# Patient Record
Sex: Female | Born: 1940
Health system: Southern US, Community
[De-identification: ages and names within clinical notes are randomized; demographics above are authoritative.]

## PROBLEM LIST (undated history)

## (undated) ENCOUNTER — Encounter: Attending: Internal Medicine | Primary: Internal Medicine

## (undated) ENCOUNTER — Encounter

## (undated) ENCOUNTER — Ambulatory Visit: Payer: BLUE CROSS/BLUE SHIELD | Attending: Internal Medicine | Primary: Internal Medicine

## (undated) ENCOUNTER — Ambulatory Visit

## (undated) ENCOUNTER — Telehealth

## (undated) ENCOUNTER — Ambulatory Visit: Attending: Family | Primary: Family

## (undated) ENCOUNTER — Ambulatory Visit: Payer: MEDICARE | Attending: Internal Medicine | Primary: Internal Medicine

## (undated) ENCOUNTER — Encounter
Attending: Student in an Organized Health Care Education/Training Program | Primary: Student in an Organized Health Care Education/Training Program

## (undated) ENCOUNTER — Telehealth: Attending: Internal Medicine | Primary: Internal Medicine

## (undated) ENCOUNTER — Ambulatory Visit: Payer: Medicare (Managed Care) | Attending: Internal Medicine | Primary: Internal Medicine

## (undated) DIAGNOSIS — Z8719 Personal history of other diseases of the digestive system: Secondary | ICD-10-CM

## (undated) DIAGNOSIS — H9190 Unspecified hearing loss, unspecified ear: Secondary | ICD-10-CM

## (undated) DIAGNOSIS — K219 Gastro-esophageal reflux disease without esophagitis: Secondary | ICD-10-CM

## (undated) DIAGNOSIS — N840 Polyp of corpus uteri: Secondary | ICD-10-CM

## (undated) DIAGNOSIS — M1990 Unspecified osteoarthritis, unspecified site: Secondary | ICD-10-CM

## (undated) DIAGNOSIS — E785 Hyperlipidemia, unspecified: Secondary | ICD-10-CM

## (undated) DIAGNOSIS — G709 Myoneural disorder, unspecified: Secondary | ICD-10-CM

## (undated) DIAGNOSIS — Z85828 Personal history of other malignant neoplasm of skin: Secondary | ICD-10-CM

## (undated) DIAGNOSIS — C801 Malignant (primary) neoplasm, unspecified: Secondary | ICD-10-CM

## (undated) DIAGNOSIS — I10 Essential (primary) hypertension: Secondary | ICD-10-CM

## (undated) DIAGNOSIS — E039 Hypothyroidism, unspecified: Secondary | ICD-10-CM

## (undated) DIAGNOSIS — L0390 Cellulitis, unspecified: Secondary | ICD-10-CM

## (undated) DIAGNOSIS — R7303 Prediabetes: Secondary | ICD-10-CM

## (undated) DIAGNOSIS — Z973 Presence of spectacles and contact lenses: Secondary | ICD-10-CM

## (undated) DIAGNOSIS — Z9889 Other specified postprocedural states: Secondary | ICD-10-CM

## (undated) DIAGNOSIS — E559 Vitamin D deficiency, unspecified: Secondary | ICD-10-CM

## (undated) DIAGNOSIS — K529 Noninfective gastroenteritis and colitis, unspecified: Secondary | ICD-10-CM

## (undated) DIAGNOSIS — M8580 Other specified disorders of bone density and structure, unspecified site: Secondary | ICD-10-CM

## (undated) HISTORY — PX: EYE SURGERY: SHX253

## (undated) HISTORY — DX: Noninfective gastroenteritis and colitis, unspecified: K52.9

## (undated) HISTORY — DX: Unspecified osteoarthritis, unspecified site: M19.90

## (undated) HISTORY — PX: DILATION AND CURETTAGE OF UTERUS: SHX78

## (undated) HISTORY — DX: Hyperlipidemia, unspecified: E78.5

## (undated) HISTORY — DX: Essential (primary) hypertension: I10

## (undated) HISTORY — DX: Other specified disorders of bone density and structure, unspecified site: M85.80

## (undated) HISTORY — DX: Vitamin D deficiency, unspecified: E55.9

## (undated) HISTORY — DX: Cellulitis, unspecified: L03.90

## (undated) HISTORY — DX: Gastro-esophageal reflux disease without esophagitis: K21.9

## (undated) HISTORY — PX: TONSILLECTOMY: SUR1361

---

## 1998-03-14 ENCOUNTER — Other Ambulatory Visit: Admission: RE | Admit: 1998-03-14 | Discharge: 1998-03-14 | Payer: Self-pay | Admitting: *Deleted

## 1998-03-14 ENCOUNTER — Ambulatory Visit (HOSPITAL_COMMUNITY): Admission: RE | Admit: 1998-03-14 | Discharge: 1998-03-14 | Payer: Self-pay | Admitting: *Deleted

## 1998-03-17 ENCOUNTER — Ambulatory Visit (HOSPITAL_COMMUNITY): Admission: RE | Admit: 1998-03-17 | Discharge: 1998-03-17 | Payer: Self-pay | Admitting: *Deleted

## 1998-05-02 ENCOUNTER — Ambulatory Visit (HOSPITAL_COMMUNITY): Admission: RE | Admit: 1998-05-02 | Discharge: 1998-05-02 | Payer: Self-pay | Admitting: Pulmonary Disease

## 1998-07-28 ENCOUNTER — Encounter: Payer: Self-pay | Admitting: Pulmonary Disease

## 1998-08-03 ENCOUNTER — Ambulatory Visit (HOSPITAL_COMMUNITY): Admission: RE | Admit: 1998-08-03 | Discharge: 1998-08-03 | Payer: Self-pay | Admitting: Pulmonary Disease

## 1999-04-27 ENCOUNTER — Other Ambulatory Visit: Admission: RE | Admit: 1999-04-27 | Discharge: 1999-04-27 | Payer: Self-pay | Admitting: *Deleted

## 1999-07-05 ENCOUNTER — Ambulatory Visit (HOSPITAL_COMMUNITY): Admission: RE | Admit: 1999-07-05 | Discharge: 1999-07-05 | Payer: Self-pay | Admitting: Gastroenterology

## 1999-11-16 ENCOUNTER — Ambulatory Visit (HOSPITAL_COMMUNITY): Admission: RE | Admit: 1999-11-16 | Discharge: 1999-11-16 | Payer: Self-pay | Admitting: *Deleted

## 2000-04-30 ENCOUNTER — Other Ambulatory Visit: Admission: RE | Admit: 2000-04-30 | Discharge: 2000-04-30 | Payer: Self-pay | Admitting: *Deleted

## 2001-06-17 ENCOUNTER — Encounter: Payer: Self-pay | Admitting: Internal Medicine

## 2001-06-17 ENCOUNTER — Ambulatory Visit (HOSPITAL_COMMUNITY): Admission: RE | Admit: 2001-06-17 | Discharge: 2001-06-17 | Payer: Self-pay | Admitting: Internal Medicine

## 2002-06-02 ENCOUNTER — Other Ambulatory Visit: Admission: RE | Admit: 2002-06-02 | Discharge: 2002-06-02 | Payer: Self-pay | Admitting: Internal Medicine

## 2003-06-04 ENCOUNTER — Encounter: Payer: Self-pay | Admitting: Internal Medicine

## 2003-06-04 ENCOUNTER — Ambulatory Visit (HOSPITAL_COMMUNITY): Admission: RE | Admit: 2003-06-04 | Discharge: 2003-06-04 | Payer: Self-pay | Admitting: Pulmonary Disease

## 2003-11-20 HISTORY — PX: MOHS SURGERY: SUR867

## 2004-06-06 ENCOUNTER — Ambulatory Visit (HOSPITAL_COMMUNITY): Admission: RE | Admit: 2004-06-06 | Discharge: 2004-06-06 | Payer: Self-pay | Admitting: Internal Medicine

## 2004-06-06 ENCOUNTER — Other Ambulatory Visit: Admission: RE | Admit: 2004-06-06 | Discharge: 2004-06-06 | Payer: Self-pay | Admitting: Internal Medicine

## 2004-08-30 ENCOUNTER — Encounter (INDEPENDENT_AMBULATORY_CARE_PROVIDER_SITE_OTHER): Payer: Self-pay | Admitting: Specialist

## 2004-08-30 ENCOUNTER — Ambulatory Visit (HOSPITAL_COMMUNITY): Admission: RE | Admit: 2004-08-30 | Discharge: 2004-08-30 | Payer: Self-pay | Admitting: Gastroenterology

## 2004-11-22 ENCOUNTER — Ambulatory Visit (HOSPITAL_COMMUNITY): Admission: RE | Admit: 2004-11-22 | Discharge: 2004-11-22 | Payer: Self-pay | Admitting: Internal Medicine

## 2005-06-15 ENCOUNTER — Ambulatory Visit (HOSPITAL_COMMUNITY): Admission: RE | Admit: 2005-06-15 | Discharge: 2005-06-15 | Payer: Self-pay | Admitting: Internal Medicine

## 2005-12-04 ENCOUNTER — Ambulatory Visit (HOSPITAL_COMMUNITY): Admission: RE | Admit: 2005-12-04 | Discharge: 2005-12-04 | Payer: Self-pay | Admitting: Internal Medicine

## 2006-05-07 ENCOUNTER — Encounter: Admission: RE | Admit: 2006-05-07 | Discharge: 2006-05-07 | Payer: Self-pay | Admitting: Gastroenterology

## 2006-06-20 ENCOUNTER — Ambulatory Visit (HOSPITAL_COMMUNITY): Admission: RE | Admit: 2006-06-20 | Discharge: 2006-06-20 | Payer: Self-pay | Admitting: Internal Medicine

## 2007-07-17 ENCOUNTER — Other Ambulatory Visit: Admission: RE | Admit: 2007-07-17 | Discharge: 2007-07-17 | Payer: Self-pay | Admitting: Internal Medicine

## 2008-07-20 ENCOUNTER — Ambulatory Visit (HOSPITAL_COMMUNITY): Admission: RE | Admit: 2008-07-20 | Discharge: 2008-07-20 | Payer: Self-pay | Admitting: Internal Medicine

## 2008-09-16 ENCOUNTER — Ambulatory Visit: Payer: Self-pay

## 2009-01-13 ENCOUNTER — Ambulatory Visit (HOSPITAL_COMMUNITY): Admission: RE | Admit: 2009-01-13 | Discharge: 2009-01-13 | Payer: Self-pay | Admitting: Internal Medicine

## 2009-08-17 ENCOUNTER — Other Ambulatory Visit: Admission: RE | Admit: 2009-08-17 | Discharge: 2009-08-17 | Payer: Self-pay | Admitting: Internal Medicine

## 2009-08-17 ENCOUNTER — Ambulatory Visit (HOSPITAL_COMMUNITY): Admission: RE | Admit: 2009-08-17 | Discharge: 2009-08-17 | Payer: Self-pay | Admitting: Internal Medicine

## 2011-03-16 ENCOUNTER — Other Ambulatory Visit (HOSPITAL_COMMUNITY): Payer: Self-pay | Admitting: Internal Medicine

## 2011-03-16 ENCOUNTER — Ambulatory Visit (HOSPITAL_COMMUNITY)
Admission: RE | Admit: 2011-03-16 | Discharge: 2011-03-16 | Disposition: A | Discharge status: Home / Self Care | Payer: Medicare Other | Source: Ambulatory Visit | Attending: Internal Medicine | Admitting: Internal Medicine

## 2011-03-16 DIAGNOSIS — Z0000 Encounter for general adult medical examination without abnormal findings: Secondary | ICD-10-CM | POA: Insufficient documentation

## 2011-03-16 DIAGNOSIS — F17200 Nicotine dependence, unspecified, uncomplicated: Secondary | ICD-10-CM | POA: Insufficient documentation

## 2011-03-16 DIAGNOSIS — R5383 Other fatigue: Secondary | ICD-10-CM | POA: Insufficient documentation

## 2011-03-16 DIAGNOSIS — R5381 Other malaise: Secondary | ICD-10-CM | POA: Insufficient documentation

## 2011-04-06 NOTE — Op Note (Signed)
NAMEMAECYN, PANNING                ACCOUNT NO.:  [id number]   MEDICAL RECORD NO.:  [id number]          PATIENT TYPE:  AMB   LOCATION:  ENDO                         FACILITY:  [facility]   PHYSICIAN:  Jeryl Columbia, M.D.    DATE OF BIRTH:  [birth year]   DATE OF PROCEDURE:  08/30/2004  DATE OF DISCHARGE:                                 OPERATIVE REPORT   PROCEDURE:  Colonoscopy.   INDICATIONS:  Patient with a history of colon polyps, due for re-screening,  also with some episodic diarrhea.  Family history of ulcerative colitis.  Consent was signed after risks, benefits, methods, and options thoroughly  discussed multiple times in the past.   MEDICINES USED:  Demerol 70 mg, Versed 7 mg.   PROCEDURE:  Rectal inspection was pertinent for external hemorrhoids, small.  Digital exam was negative.  The video pediatric adjustable colonoscope was  inserted and easily adjusted around the colon to the cecum.  This did  require some abdominal pressure but no position changes.  No abnormality was  seen on insertion.  The cecum was identified by the appendiceal orifice and  the ileocecal valve; in fact, the scope was inserted a short way into the  terminal ileum, which was normal.  Photo documentation was obtained.  Random  biopsies in the TI and the colon were obtained, put in separate containers.  On slow withdrawal through the colon, no abnormalities were seen,  specifically no polyps or masses or diverticula.  The prep  was very good.  Suctioning was not needed.  There was no washing needed.  Once back in the  rectum, anorectal pull-through and retroflexion confirmed some small  hemorrhoids.  The scope was reinserted a short way up the left side of the  colon, air was suctioned, and the scope removed.  The patient tolerated the  procedure well.  There was no obvious immediate complication.   ENDOSCOPIC DIAGNOSES:  1.  Internal-external hemorrhoids.  2.  Otherwise normal colonoscopy to the terminal  ileum, status post random      biopsies throughout.   PLAN:  1.  Await pathology to rule out microscopic colitis.  2.  Happy to see back p.r.n.  Otherwise re-screening in five years.  Return      care to Dr. Johnnye Sima for the customary health care maintenance to      include yearly rectals and guaiacs.       MEM/MEDQ  D:  08/30/2004  T:  08/30/2004  Job:  [id number]   cc:   Darcus Austin, D.O.  [address], [address]  [place]  [place] [id number]  Fax: [phone]

## 2011-08-22 ENCOUNTER — Other Ambulatory Visit: Payer: Self-pay | Admitting: Emergency Medicine

## 2011-08-22 ENCOUNTER — Other Ambulatory Visit (HOSPITAL_COMMUNITY)
Admission: RE | Admit: 2011-08-22 | Discharge: 2011-08-22 | Disposition: A | Discharge status: Home / Self Care | Payer: Medicare Other | Source: Ambulatory Visit | Attending: Internal Medicine | Admitting: Internal Medicine

## 2011-08-22 DIAGNOSIS — Z124 Encounter for screening for malignant neoplasm of cervix: Secondary | ICD-10-CM | POA: Insufficient documentation

## 2011-08-23 ENCOUNTER — Other Ambulatory Visit (HOSPITAL_COMMUNITY): Payer: Self-pay | Admitting: Internal Medicine

## 2011-08-23 DIAGNOSIS — R140 Abdominal distension (gaseous): Secondary | ICD-10-CM

## 2011-08-29 ENCOUNTER — Ambulatory Visit (HOSPITAL_COMMUNITY): Payer: Medicare Other

## 2011-08-31 ENCOUNTER — Ambulatory Visit (HOSPITAL_COMMUNITY)
Admission: RE | Admit: 2011-08-31 | Discharge: 2011-08-31 | Disposition: A | Discharge status: Home / Self Care | Payer: Medicare Other | Source: Ambulatory Visit | Attending: Internal Medicine | Admitting: Internal Medicine

## 2011-08-31 DIAGNOSIS — R140 Abdominal distension (gaseous): Secondary | ICD-10-CM

## 2011-08-31 DIAGNOSIS — N9489 Other specified conditions associated with female genital organs and menstrual cycle: Secondary | ICD-10-CM | POA: Insufficient documentation

## 2011-08-31 DIAGNOSIS — Z780 Asymptomatic menopausal state: Secondary | ICD-10-CM | POA: Insufficient documentation

## 2011-08-31 DIAGNOSIS — D251 Intramural leiomyoma of uterus: Secondary | ICD-10-CM | POA: Insufficient documentation

## 2011-12-04 DIAGNOSIS — K5289 Other specified noninfective gastroenteritis and colitis: Secondary | ICD-10-CM | POA: Diagnosis not present

## 2011-12-04 DIAGNOSIS — R109 Unspecified abdominal pain: Secondary | ICD-10-CM | POA: Diagnosis not present

## 2012-01-10 DIAGNOSIS — E782 Mixed hyperlipidemia: Secondary | ICD-10-CM | POA: Diagnosis not present

## 2012-01-10 DIAGNOSIS — E039 Hypothyroidism, unspecified: Secondary | ICD-10-CM | POA: Diagnosis not present

## 2012-01-10 DIAGNOSIS — N3000 Acute cystitis without hematuria: Secondary | ICD-10-CM | POA: Diagnosis not present

## 2012-01-10 DIAGNOSIS — I10 Essential (primary) hypertension: Secondary | ICD-10-CM | POA: Diagnosis not present

## 2012-01-10 DIAGNOSIS — R7309 Other abnormal glucose: Secondary | ICD-10-CM | POA: Diagnosis not present

## 2012-01-15 DIAGNOSIS — J0410 Acute tracheitis without obstruction: Secondary | ICD-10-CM | POA: Diagnosis not present

## 2012-02-05 DIAGNOSIS — H269 Unspecified cataract: Secondary | ICD-10-CM | POA: Diagnosis not present

## 2012-02-27 DIAGNOSIS — R7309 Other abnormal glucose: Secondary | ICD-10-CM | POA: Diagnosis not present

## 2012-02-27 DIAGNOSIS — E039 Hypothyroidism, unspecified: Secondary | ICD-10-CM | POA: Diagnosis not present

## 2012-02-27 DIAGNOSIS — I10 Essential (primary) hypertension: Secondary | ICD-10-CM | POA: Diagnosis not present

## 2012-02-27 DIAGNOSIS — E782 Mixed hyperlipidemia: Secondary | ICD-10-CM | POA: Diagnosis not present

## 2012-02-27 DIAGNOSIS — E559 Vitamin D deficiency, unspecified: Secondary | ICD-10-CM | POA: Diagnosis not present

## 2012-02-27 DIAGNOSIS — R197 Diarrhea, unspecified: Secondary | ICD-10-CM | POA: Diagnosis not present

## 2012-03-17 DIAGNOSIS — E039 Hypothyroidism, unspecified: Secondary | ICD-10-CM | POA: Diagnosis not present

## 2012-03-17 DIAGNOSIS — M79609 Pain in unspecified limb: Secondary | ICD-10-CM | POA: Diagnosis not present

## 2012-04-16 DIAGNOSIS — L821 Other seborrheic keratosis: Secondary | ICD-10-CM | POA: Diagnosis not present

## 2012-04-16 DIAGNOSIS — L570 Actinic keratosis: Secondary | ICD-10-CM | POA: Diagnosis not present

## 2012-04-16 DIAGNOSIS — Z85828 Personal history of other malignant neoplasm of skin: Secondary | ICD-10-CM | POA: Diagnosis not present

## 2012-06-02 DIAGNOSIS — K5289 Other specified noninfective gastroenteritis and colitis: Secondary | ICD-10-CM | POA: Diagnosis not present

## 2012-06-23 DIAGNOSIS — R7309 Other abnormal glucose: Secondary | ICD-10-CM | POA: Diagnosis not present

## 2012-06-23 DIAGNOSIS — I10 Essential (primary) hypertension: Secondary | ICD-10-CM | POA: Diagnosis not present

## 2012-06-23 DIAGNOSIS — Z79899 Other long term (current) drug therapy: Secondary | ICD-10-CM | POA: Diagnosis not present

## 2012-06-23 DIAGNOSIS — E559 Vitamin D deficiency, unspecified: Secondary | ICD-10-CM | POA: Diagnosis not present

## 2012-06-23 DIAGNOSIS — E782 Mixed hyperlipidemia: Secondary | ICD-10-CM | POA: Diagnosis not present

## 2012-07-11 DIAGNOSIS — N393 Stress incontinence (female) (male): Secondary | ICD-10-CM | POA: Diagnosis not present

## 2012-07-11 DIAGNOSIS — N952 Postmenopausal atrophic vaginitis: Secondary | ICD-10-CM | POA: Diagnosis not present

## 2012-07-11 DIAGNOSIS — Z124 Encounter for screening for malignant neoplasm of cervix: Secondary | ICD-10-CM | POA: Diagnosis not present

## 2012-07-16 DIAGNOSIS — S83429A Sprain of lateral collateral ligament of unspecified knee, initial encounter: Secondary | ICD-10-CM | POA: Diagnosis not present

## 2012-07-16 DIAGNOSIS — I10 Essential (primary) hypertension: Secondary | ICD-10-CM | POA: Diagnosis not present

## 2012-07-16 DIAGNOSIS — E039 Hypothyroidism, unspecified: Secondary | ICD-10-CM | POA: Diagnosis not present

## 2012-08-06 DIAGNOSIS — R3915 Urgency of urination: Secondary | ICD-10-CM | POA: Diagnosis not present

## 2012-08-06 DIAGNOSIS — N393 Stress incontinence (female) (male): Secondary | ICD-10-CM | POA: Diagnosis not present

## 2012-08-19 DIAGNOSIS — Z23 Encounter for immunization: Secondary | ICD-10-CM | POA: Diagnosis not present

## 2012-09-16 DIAGNOSIS — R030 Elevated blood-pressure reading, without diagnosis of hypertension: Secondary | ICD-10-CM | POA: Diagnosis not present

## 2012-09-16 DIAGNOSIS — E559 Vitamin D deficiency, unspecified: Secondary | ICD-10-CM | POA: Diagnosis not present

## 2012-09-16 DIAGNOSIS — R5383 Other fatigue: Secondary | ICD-10-CM | POA: Diagnosis not present

## 2012-09-16 DIAGNOSIS — R350 Frequency of micturition: Secondary | ICD-10-CM | POA: Diagnosis not present

## 2012-09-16 DIAGNOSIS — E782 Mixed hyperlipidemia: Secondary | ICD-10-CM | POA: Diagnosis not present

## 2012-09-16 DIAGNOSIS — R197 Diarrhea, unspecified: Secondary | ICD-10-CM | POA: Diagnosis not present

## 2012-09-16 DIAGNOSIS — Z1212 Encounter for screening for malignant neoplasm of rectum: Secondary | ICD-10-CM | POA: Diagnosis not present

## 2012-09-16 DIAGNOSIS — R5381 Other malaise: Secondary | ICD-10-CM | POA: Diagnosis not present

## 2012-09-16 DIAGNOSIS — E538 Deficiency of other specified B group vitamins: Secondary | ICD-10-CM | POA: Diagnosis not present

## 2012-09-16 DIAGNOSIS — I10 Essential (primary) hypertension: Secondary | ICD-10-CM | POA: Diagnosis not present

## 2012-09-16 DIAGNOSIS — D649 Anemia, unspecified: Secondary | ICD-10-CM | POA: Diagnosis not present

## 2012-09-16 DIAGNOSIS — R7309 Other abnormal glucose: Secondary | ICD-10-CM | POA: Diagnosis not present

## 2012-09-25 DIAGNOSIS — N318 Other neuromuscular dysfunction of bladder: Secondary | ICD-10-CM | POA: Diagnosis not present

## 2012-09-26 DIAGNOSIS — I8310 Varicose veins of unspecified lower extremity with inflammation: Secondary | ICD-10-CM | POA: Diagnosis not present

## 2012-09-30 LAB — FECAL OCCULT BLOOD, GUAIAC: Fecal Occult Blood: NEGATIVE

## 2012-10-01 DIAGNOSIS — Z8262 Family history of osteoporosis: Secondary | ICD-10-CM | POA: Diagnosis not present

## 2012-10-01 DIAGNOSIS — Z1231 Encounter for screening mammogram for malignant neoplasm of breast: Secondary | ICD-10-CM | POA: Diagnosis not present

## 2012-10-01 DIAGNOSIS — M899 Disorder of bone, unspecified: Secondary | ICD-10-CM | POA: Diagnosis not present

## 2012-10-01 DIAGNOSIS — M949 Disorder of cartilage, unspecified: Secondary | ICD-10-CM | POA: Diagnosis not present

## 2012-10-01 LAB — HM DEXA SCAN

## 2012-10-14 DIAGNOSIS — R51 Headache: Secondary | ICD-10-CM | POA: Diagnosis not present

## 2012-10-14 DIAGNOSIS — R7309 Other abnormal glucose: Secondary | ICD-10-CM | POA: Diagnosis not present

## 2012-10-14 DIAGNOSIS — Z79899 Other long term (current) drug therapy: Secondary | ICD-10-CM | POA: Diagnosis not present

## 2012-10-14 DIAGNOSIS — E039 Hypothyroidism, unspecified: Secondary | ICD-10-CM | POA: Diagnosis not present

## 2012-10-14 DIAGNOSIS — M542 Cervicalgia: Secondary | ICD-10-CM | POA: Diagnosis not present

## 2012-10-21 DIAGNOSIS — T7840XA Allergy, unspecified, initial encounter: Secondary | ICD-10-CM | POA: Diagnosis not present

## 2012-10-23 DIAGNOSIS — M79609 Pain in unspecified limb: Secondary | ICD-10-CM | POA: Diagnosis not present

## 2012-10-27 DIAGNOSIS — I8310 Varicose veins of unspecified lower extremity with inflammation: Secondary | ICD-10-CM | POA: Diagnosis not present

## 2012-10-29 DIAGNOSIS — I10 Essential (primary) hypertension: Secondary | ICD-10-CM | POA: Diagnosis not present

## 2012-10-29 DIAGNOSIS — E039 Hypothyroidism, unspecified: Secondary | ICD-10-CM | POA: Diagnosis not present

## 2012-10-29 DIAGNOSIS — E782 Mixed hyperlipidemia: Secondary | ICD-10-CM | POA: Diagnosis not present

## 2012-11-10 DIAGNOSIS — J019 Acute sinusitis, unspecified: Secondary | ICD-10-CM | POA: Diagnosis not present

## 2012-12-01 DIAGNOSIS — K5289 Other specified noninfective gastroenteritis and colitis: Secondary | ICD-10-CM | POA: Diagnosis not present

## 2012-12-02 DIAGNOSIS — Z79899 Other long term (current) drug therapy: Secondary | ICD-10-CM | POA: Diagnosis not present

## 2012-12-02 DIAGNOSIS — E039 Hypothyroidism, unspecified: Secondary | ICD-10-CM | POA: Diagnosis not present

## 2012-12-29 DIAGNOSIS — I8310 Varicose veins of unspecified lower extremity with inflammation: Secondary | ICD-10-CM | POA: Diagnosis not present

## 2013-01-12 DIAGNOSIS — I8310 Varicose veins of unspecified lower extremity with inflammation: Secondary | ICD-10-CM | POA: Diagnosis not present

## 2013-01-12 DIAGNOSIS — M79609 Pain in unspecified limb: Secondary | ICD-10-CM | POA: Diagnosis not present

## 2013-01-14 DIAGNOSIS — I8310 Varicose veins of unspecified lower extremity with inflammation: Secondary | ICD-10-CM | POA: Diagnosis not present

## 2013-01-14 DIAGNOSIS — M79609 Pain in unspecified limb: Secondary | ICD-10-CM | POA: Diagnosis not present

## 2013-01-28 DIAGNOSIS — I10 Essential (primary) hypertension: Secondary | ICD-10-CM | POA: Diagnosis not present

## 2013-01-28 DIAGNOSIS — R7309 Other abnormal glucose: Secondary | ICD-10-CM | POA: Diagnosis not present

## 2013-01-28 DIAGNOSIS — Z79899 Other long term (current) drug therapy: Secondary | ICD-10-CM | POA: Diagnosis not present

## 2013-01-28 DIAGNOSIS — E782 Mixed hyperlipidemia: Secondary | ICD-10-CM | POA: Diagnosis not present

## 2013-01-28 DIAGNOSIS — E559 Vitamin D deficiency, unspecified: Secondary | ICD-10-CM | POA: Diagnosis not present

## 2013-01-29 DIAGNOSIS — M79609 Pain in unspecified limb: Secondary | ICD-10-CM | POA: Diagnosis not present

## 2013-01-29 DIAGNOSIS — I8310 Varicose veins of unspecified lower extremity with inflammation: Secondary | ICD-10-CM | POA: Diagnosis not present

## 2013-02-02 DIAGNOSIS — H52229 Regular astigmatism, unspecified eye: Secondary | ICD-10-CM | POA: Diagnosis not present

## 2013-02-02 DIAGNOSIS — H2510 Age-related nuclear cataract, unspecified eye: Secondary | ICD-10-CM | POA: Diagnosis not present

## 2013-02-02 DIAGNOSIS — H524 Presbyopia: Secondary | ICD-10-CM | POA: Diagnosis not present

## 2013-02-02 DIAGNOSIS — H5231 Anisometropia: Secondary | ICD-10-CM | POA: Diagnosis not present

## 2013-02-12 DIAGNOSIS — I8310 Varicose veins of unspecified lower extremity with inflammation: Secondary | ICD-10-CM | POA: Diagnosis not present

## 2013-02-12 DIAGNOSIS — M79609 Pain in unspecified limb: Secondary | ICD-10-CM | POA: Diagnosis not present

## 2013-02-12 DIAGNOSIS — M7981 Nontraumatic hematoma of soft tissue: Secondary | ICD-10-CM | POA: Diagnosis not present

## 2013-03-18 DIAGNOSIS — M7981 Nontraumatic hematoma of soft tissue: Secondary | ICD-10-CM | POA: Diagnosis not present

## 2013-04-16 DIAGNOSIS — Z85828 Personal history of other malignant neoplasm of skin: Secondary | ICD-10-CM | POA: Diagnosis not present

## 2013-04-16 DIAGNOSIS — L821 Other seborrheic keratosis: Secondary | ICD-10-CM | POA: Diagnosis not present

## 2013-04-16 DIAGNOSIS — L570 Actinic keratosis: Secondary | ICD-10-CM | POA: Diagnosis not present

## 2013-04-16 DIAGNOSIS — L608 Other nail disorders: Secondary | ICD-10-CM | POA: Diagnosis not present

## 2013-04-30 DIAGNOSIS — E782 Mixed hyperlipidemia: Secondary | ICD-10-CM | POA: Diagnosis not present

## 2013-04-30 DIAGNOSIS — E559 Vitamin D deficiency, unspecified: Secondary | ICD-10-CM | POA: Diagnosis not present

## 2013-04-30 DIAGNOSIS — Z79899 Other long term (current) drug therapy: Secondary | ICD-10-CM | POA: Diagnosis not present

## 2013-04-30 DIAGNOSIS — E039 Hypothyroidism, unspecified: Secondary | ICD-10-CM | POA: Diagnosis not present

## 2013-04-30 DIAGNOSIS — R7309 Other abnormal glucose: Secondary | ICD-10-CM | POA: Diagnosis not present

## 2013-04-30 DIAGNOSIS — I10 Essential (primary) hypertension: Secondary | ICD-10-CM | POA: Diagnosis not present

## 2013-05-14 DIAGNOSIS — N3000 Acute cystitis without hematuria: Secondary | ICD-10-CM | POA: Diagnosis not present

## 2013-06-09 DIAGNOSIS — K2100 Gastro-esophageal reflux disease with esophagitis, without bleeding: Secondary | ICD-10-CM | POA: Diagnosis not present

## 2013-06-09 DIAGNOSIS — K5289 Other specified noninfective gastroenteritis and colitis: Secondary | ICD-10-CM | POA: Diagnosis not present

## 2013-06-10 DIAGNOSIS — J449 Chronic obstructive pulmonary disease, unspecified: Secondary | ICD-10-CM | POA: Diagnosis not present

## 2013-06-10 DIAGNOSIS — N3000 Acute cystitis without hematuria: Secondary | ICD-10-CM | POA: Diagnosis not present

## 2013-06-10 DIAGNOSIS — J4489 Other specified chronic obstructive pulmonary disease: Secondary | ICD-10-CM | POA: Diagnosis not present

## 2013-06-30 DIAGNOSIS — E559 Vitamin D deficiency, unspecified: Secondary | ICD-10-CM | POA: Diagnosis not present

## 2013-06-30 DIAGNOSIS — I10 Essential (primary) hypertension: Secondary | ICD-10-CM | POA: Diagnosis not present

## 2013-06-30 DIAGNOSIS — E782 Mixed hyperlipidemia: Secondary | ICD-10-CM | POA: Diagnosis not present

## 2013-06-30 DIAGNOSIS — Z79899 Other long term (current) drug therapy: Secondary | ICD-10-CM | POA: Diagnosis not present

## 2013-06-30 DIAGNOSIS — E538 Deficiency of other specified B group vitamins: Secondary | ICD-10-CM | POA: Diagnosis not present

## 2013-06-30 DIAGNOSIS — R7309 Other abnormal glucose: Secondary | ICD-10-CM | POA: Diagnosis not present

## 2013-07-09 DIAGNOSIS — I8310 Varicose veins of unspecified lower extremity with inflammation: Secondary | ICD-10-CM | POA: Diagnosis not present

## 2013-07-09 DIAGNOSIS — M79609 Pain in unspecified limb: Secondary | ICD-10-CM | POA: Diagnosis not present

## 2013-07-14 DIAGNOSIS — I8310 Varicose veins of unspecified lower extremity with inflammation: Secondary | ICD-10-CM | POA: Diagnosis not present

## 2013-07-14 DIAGNOSIS — M79609 Pain in unspecified limb: Secondary | ICD-10-CM | POA: Diagnosis not present

## 2013-08-18 DIAGNOSIS — Z23 Encounter for immunization: Secondary | ICD-10-CM | POA: Diagnosis not present

## 2013-09-01 ENCOUNTER — Encounter: Payer: Self-pay | Admitting: Internal Medicine

## 2013-09-02 DIAGNOSIS — H04129 Dry eye syndrome of unspecified lacrimal gland: Secondary | ICD-10-CM | POA: Diagnosis not present

## 2013-09-04 ENCOUNTER — Encounter: Payer: Self-pay | Admitting: Internal Medicine

## 2013-11-03 DIAGNOSIS — Z1231 Encounter for screening mammogram for malignant neoplasm of breast: Secondary | ICD-10-CM | POA: Diagnosis not present

## 2013-11-03 DIAGNOSIS — Z803 Family history of malignant neoplasm of breast: Secondary | ICD-10-CM | POA: Diagnosis not present

## 2013-12-10 DIAGNOSIS — K5289 Other specified noninfective gastroenteritis and colitis: Secondary | ICD-10-CM | POA: Diagnosis not present

## 2013-12-15 ENCOUNTER — Encounter: Payer: Self-pay | Admitting: Emergency Medicine

## 2013-12-15 ENCOUNTER — Ambulatory Visit (INDEPENDENT_AMBULATORY_CARE_PROVIDER_SITE_OTHER): Payer: Medicare Other | Admitting: Emergency Medicine

## 2013-12-15 VITALS — BP 118/76 | HR 78 | Temp 98.6°F | Resp 18 | Ht 65.5 in | Wt 112.0 lb

## 2013-12-15 DIAGNOSIS — E039 Hypothyroidism, unspecified: Secondary | ICD-10-CM | POA: Insufficient documentation

## 2013-12-15 DIAGNOSIS — K219 Gastro-esophageal reflux disease without esophagitis: Secondary | ICD-10-CM

## 2013-12-15 DIAGNOSIS — R5383 Other fatigue: Secondary | ICD-10-CM

## 2013-12-15 DIAGNOSIS — D649 Anemia, unspecified: Secondary | ICD-10-CM

## 2013-12-15 DIAGNOSIS — R7309 Other abnormal glucose: Secondary | ICD-10-CM

## 2013-12-15 DIAGNOSIS — E559 Vitamin D deficiency, unspecified: Secondary | ICD-10-CM

## 2013-12-15 DIAGNOSIS — T7840XA Allergy, unspecified, initial encounter: Secondary | ICD-10-CM | POA: Insufficient documentation

## 2013-12-15 DIAGNOSIS — M129 Arthropathy, unspecified: Secondary | ICD-10-CM | POA: Diagnosis not present

## 2013-12-15 DIAGNOSIS — R200 Anesthesia of skin: Secondary | ICD-10-CM

## 2013-12-15 DIAGNOSIS — R030 Elevated blood-pressure reading, without diagnosis of hypertension: Secondary | ICD-10-CM

## 2013-12-15 DIAGNOSIS — E782 Mixed hyperlipidemia: Secondary | ICD-10-CM | POA: Insufficient documentation

## 2013-12-15 DIAGNOSIS — E785 Hyperlipidemia, unspecified: Secondary | ICD-10-CM | POA: Diagnosis not present

## 2013-12-15 DIAGNOSIS — I10 Essential (primary) hypertension: Secondary | ICD-10-CM

## 2013-12-15 DIAGNOSIS — M810 Age-related osteoporosis without current pathological fracture: Secondary | ICD-10-CM | POA: Insufficient documentation

## 2013-12-15 DIAGNOSIS — R5381 Other malaise: Secondary | ICD-10-CM

## 2013-12-15 DIAGNOSIS — Z0000 Encounter for general adult medical examination without abnormal findings: Secondary | ICD-10-CM

## 2013-12-15 DIAGNOSIS — R350 Frequency of micturition: Secondary | ICD-10-CM | POA: Diagnosis not present

## 2013-12-15 DIAGNOSIS — K2100 Gastro-esophageal reflux disease with esophagitis, without bleeding: Secondary | ICD-10-CM | POA: Insufficient documentation

## 2013-12-15 DIAGNOSIS — M8000XA Age-related osteoporosis with current pathological fracture, unspecified site, initial encounter for fracture: Secondary | ICD-10-CM | POA: Insufficient documentation

## 2013-12-15 DIAGNOSIS — E538 Deficiency of other specified B group vitamins: Secondary | ICD-10-CM

## 2013-12-15 DIAGNOSIS — M1990 Unspecified osteoarthritis, unspecified site: Secondary | ICD-10-CM | POA: Insufficient documentation

## 2013-12-15 DIAGNOSIS — J45909 Unspecified asthma, uncomplicated: Secondary | ICD-10-CM | POA: Insufficient documentation

## 2013-12-15 DIAGNOSIS — M8580 Other specified disorders of bone density and structure, unspecified site: Secondary | ICD-10-CM | POA: Insufficient documentation

## 2013-12-15 LAB — MAGNESIUM: MAGNESIUM: 1.9 mg/dL (ref 1.5–2.5)

## 2013-12-15 LAB — TSH: TSH: 1.218 u[IU]/mL (ref 0.350–4.500)

## 2013-12-15 LAB — BASIC METABOLIC PANEL WITH GFR
BUN: 14 mg/dL (ref 6–23)
CALCIUM: 9.2 mg/dL (ref 8.4–10.5)
CHLORIDE: 106 meq/L (ref 96–112)
CO2: 26 meq/L (ref 19–32)
Creat: 0.84 mg/dL (ref 0.50–1.10)
GFR, EST AFRICAN AMERICAN: 80 mL/min
GFR, EST NON AFRICAN AMERICAN: 70 mL/min
GLUCOSE: 80 mg/dL (ref 70–99)
POTASSIUM: 4.2 meq/L (ref 3.5–5.3)
Sodium: 139 mEq/L (ref 135–145)

## 2013-12-15 LAB — CBC WITH DIFFERENTIAL/PLATELET
Basophils Absolute: 0 10*3/uL (ref 0.0–0.1)
Basophils Relative: 1 % (ref 0–1)
EOS PCT: 1 % (ref 0–5)
Eosinophils Absolute: 0.1 10*3/uL (ref 0.0–0.7)
HCT: 42.3 % (ref 36.0–46.0)
Hemoglobin: 14.6 g/dL (ref 12.0–15.0)
LYMPHS PCT: 29 % (ref 12–46)
Lymphs Abs: 2 10*3/uL (ref 0.7–4.0)
MCH: 32.4 pg (ref 26.0–34.0)
MCHC: 34.5 g/dL (ref 30.0–36.0)
MCV: 93.8 fL (ref 78.0–100.0)
MONO ABS: 0.6 10*3/uL (ref 0.1–1.0)
MONOS PCT: 9 % (ref 3–12)
Neutro Abs: 4.1 10*3/uL (ref 1.7–7.7)
Neutrophils Relative %: 60 % (ref 43–77)
Platelets: 169 10*3/uL (ref 150–400)
RBC: 4.51 MIL/uL (ref 3.87–5.11)
RDW: 14.7 % (ref 11.5–15.5)
WBC: 6.8 10*3/uL (ref 4.0–10.5)

## 2013-12-15 LAB — HEMOGLOBIN A1C
HEMOGLOBIN A1C: 5.8 % — AB (ref <5.7)
MEAN PLASMA GLUCOSE: 120 mg/dL — AB (ref <117)

## 2013-12-15 LAB — HEPATIC FUNCTION PANEL
ALBUMIN: 4.7 g/dL (ref 3.5–5.2)
ALT: 16 U/L (ref 0–35)
AST: 20 U/L (ref 0–37)
Alkaline Phosphatase: 38 U/L — ABNORMAL LOW (ref 39–117)
BILIRUBIN INDIRECT: 0.3 mg/dL (ref 0.0–0.9)
BILIRUBIN TOTAL: 0.4 mg/dL (ref 0.3–1.2)
Bilirubin, Direct: 0.1 mg/dL (ref 0.0–0.3)
Total Protein: 6.8 g/dL (ref 6.0–8.3)

## 2013-12-15 LAB — LIPID PANEL
CHOL/HDL RATIO: 2.5 ratio
CHOLESTEROL: 181 mg/dL (ref 0–200)
HDL: 71 mg/dL (ref >39)
LDL CALC: 95 mg/dL (ref 0–99)
TRIGLYCERIDES: 74 mg/dL (ref <150)
VLDL: 15 mg/dL (ref 0–40)

## 2013-12-15 LAB — SEDIMENTATION RATE: Sed Rate: 1 mm/hr (ref 0–22)

## 2013-12-15 LAB — RHEUMATOID FACTOR: RHEUMATOID FACTOR: 13 [IU]/mL (ref <=14)

## 2013-12-15 NOTE — Progress Notes (Signed)
Subjective:    Patient ID: Debbie Houston, female    DOB: 12-Apr-1941, 73 y.o.   MRN: [id number]  HPI Comments: 73 yo female CPE and presents for 3 month F/U for elevated bP without HTN, Cholesterol, Pre-Dm, D. deficient She has mild fatigue but resolves with naps. She has been craving sugar but eats healthy for the most part. She does water aerobic 3 x week, but keeps very active. She has never been on any HTN Rx she has had occasional elevated BP levels. She does not check BP routinely.  She has noted increased urine frequency. She has tried to increase water to help with flushing if is infection. She notes chronic diarrhea and sees [facility]  On routine basis for treatment. She notes diarrhea has improved somewhat. Her last OV was 2-3 weeks ago.   She has had increased arthritis pain in her hands right > left. She also notes increased tingling in her feet and hands. She has also noticed increase with night sweats. She is concerned the Tamoxifen may be causing some of these symptoms. She takes Tamoxifen for Osteoporosis. BMD is due 11/15.   She is up to date on her mammogram, in [facility] at [facility].       Hyperlipidemia   Current Outpatient Prescriptions on File Prior to Visit  Medication Sig Dispense Refill  . Biotin 1 MG CAPS Take 1 mg by mouth daily.      . cetirizine (ZYRTEC) 10 MG tablet Take 10 mg by mouth daily.      . cholecalciferol (VITAMIN D) 1000 UNITS tablet Take 2,000 Units by mouth daily.      . fish oil-omega-3 fatty acids 1000 MG capsule Take 2 g by mouth daily.      . Flaxseed, Linseed, (FLAX SEED OIL) 1000 MG CAPS Take 1,000 mg by mouth daily.      Marland Kitchen levothyroxine (SYNTHROID, LEVOTHROID) 125 MCG tablet Take 125 mcg by mouth daily before breakfast. 1 pill M,W,F and 1/2 pill Tues, Thurs, Sat, Sun      . simvastatin (ZOCOR) 20 MG tablet Take 20 mg by mouth every evening.      . tamoxifen (NOLVADEX) 20 MG tablet Take 20 mg by mouth daily.       No current facility-administered  medications on file prior to visit.   ALLERGIES Fosamax; Ppd; Singulair; Wellbutrin; and Sulfa antibiotics  Past Medical History  Diagnosis Date  . Hyperlipidemia   . Hypothyroid   . GERD (gastroesophageal reflux disease)   . Asthma   . Arthritis   . DJD (degenerative joint disease)   . Osteopenia   . Vitamin D deficiency   . Allergy   . Colitis   . Anemia   . Benign labile hypertension    Past Surgical History  Procedure Laterality Date  . Tonsillectomy    . Mohs surgery      LEFT NASAL BRIDGE FOR BASAL CELL   History  Substance Use Topics  . Smoking status: Light Tobacco Smoker -- 15 years    Types: Cigarettes  . Smokeless tobacco: Not on file     Comment: RARELY SMOKES  . Alcohol Use: Yes     Comment: OCCASIONAL   Family History  Problem Relation Age of Onset  . Hypertension Mother   . Cancer Mother     COLON  . Hyperlipidemia Mother   . Cancer Father     BREAST WITH BRAIN METS  . Hyperlipidemia Father   . Cancer Sister  COLON (TEENS), BREAST (50)  . Hyperlipidemia Sister   . Cancer Maternal Grandmother     RENAL  . Hyperlipidemia Maternal Grandmother   . Diabetes Brother   . Hyperlipidemia Brother   . Diabetes Maternal Grandfather   . Stroke Maternal Grandfather   . Hyperlipidemia Maternal Grandfather   . Hyperlipidemia Paternal Grandmother   . Hyperlipidemia Paternal Grandfather        Review of Systems  Eyes:       MILLER 2014 WNL  Gastrointestinal: Positive for diarrhea.       MAGOD Q 6MONTHS COLONOSCOPY 1/11 POLYP DUE 2016  Genitourinary: Positive for frequency.       GU NESI PRN GYN ROSS 2012 WNL  Musculoskeletal: Positive for arthralgias.  Skin:       JONES 2014 WNL  Neurological: Positive for numbness.  All other systems reviewed and are negative.   BP 118/76  Pulse 78  Temp(Src) 98.6 F (37 C) (Temporal)  Resp 18  Ht 5' 5.5" (1.664 m)  Wt 112 lb (50.803 kg)  BMI 18.35 kg/m2      Objective:   Physical Exam   Nursing note and vitals reviewed. Constitutional: She is oriented to person, place, and time. She appears well-developed and well-nourished. No distress.  HENT:  Head: Normocephalic and atraumatic.  Right Ear: External ear normal.  Left Ear: External ear normal.  Nose: Nose normal.  Mouth/Throat: Oropharynx is clear and moist.  Eyes: Conjunctivae and EOM are normal. Pupils are equal, round, and reactive to light. Right eye exhibits no discharge. Left eye exhibits no discharge. No scleral icterus.  Neck: Normal range of motion. Neck supple. No JVD present. No tracheal deviation present. No thyromegaly present.  Cardiovascular: Normal rate, regular rhythm, normal heart sounds and intact distal pulses.   Pulmonary/Chest: Effort normal and breath sounds normal.  Abdominal: Soft. Bowel sounds are normal. She exhibits no distension and no mass. There is no tenderness. There is no rebound and no guarding.  Genitourinary:  Def to gyn  Musculoskeletal: Normal range of motion. She exhibits no edema and no tenderness.  Lymphadenopathy:    She has no cervical adenopathy.  Neurological: She is alert and oriented to person, place, and time. She has normal reflexes. No cranial nerve deficit. She exhibits normal muscle tone. Coordination normal.  Skin: Skin is warm and dry. No rash noted. No erythema. No pallor.  Psychiatric: She has a normal mood and affect. Her behavior is normal. Judgment and thought content normal.      EKG NSCSPT WNL AORTA SCAN WNL    Assessment & Plan:  1. CPE and 3 month F/U for Hypothyroid, anemia, Elevated BP with out HTN,  Cholesterol, Pre-Dm, D. Deficient. Needs healthy diet, cardio QD and obtain healthy weight. Check Labs, Check BP if >130/80 call office. uPDATE IMMUNIZATIONS/ SCREENING LABS/ History 2. Urine frequency/ Fatigue- check labs, increase activity and H2O. Urine hygiene explained. 3.Paraesthias/ Arthritis- Check labs advise h2o exercise increase to QD. May  consider Neck xray

## 2013-12-15 NOTE — Patient Instructions (Addendum)
Urinary Tract Infection A urinary tract infection (UTI) can occur any place along the urinary tract. The tract includes the kidneys, ureters, bladder, and urethra. A type of germ called bacteria often causes a UTI. UTIs are often helped with antibiotic medicine.  HOME CARE   If given, take antibiotics as told by your doctor. Finish them even if you start to feel better.  Drink enough fluids to keep your pee (urine) clear or pale yellow.  Avoid tea, drinks with caffeine, and bubbly (carbonated) drinks.  Pee often. Avoid holding your pee in for a long time.  Pee before and after having sex (intercourse).  Wipe from front to back after you poop (bowel movement) if you are a woman. Use each tissue only once. GET HELP RIGHT AWAY IF:   You have back pain.  You have lower belly (abdominal) pain.  You have chills.  You feel sick to your stomach (nauseous).  You throw up (vomit).  Your burning or discomfort with peeing does not go away.  You have a fever.  Your symptoms are not better in 3 days. MAKE SURE YOU:   Understand these instructions.  Will watch your condition.  Will get help right away if you are not doing well or get worse. Document Released: 04/23/2008 Document Revised: 07/30/2012 Document Reviewed: 06/05/2012 [facility] Patient Information 2014 [place], [place]. Arthritis, Nonspecific Arthritis is pain, redness, warmth, or puffiness (inflammation) of a joint. The joint may be stiff or hurt when you move it. One or more joints may be affected. There are many types of arthritis. Your doctor may not know what type you have right away. The most common cause of arthritis is wear and tear on the joint (osteoarthritis). HOME CARE  Only take medicine as told by your doctor. Rest the joint as much as possible. Raise (elevate) your joint if it is puffy. Use crutches if the painful joint is in your leg. Drink enough fluids to keep your pee (urine) clear or pale yellow. Follow  your doctor's diet instructions. Use cold packs for very bad joint pain for 10 to 15 minutes every hour. Ask your doctor if it is okay for you to use hot packs. Exercise as told by your doctor. Take a warm shower if you have stiffness in the morning. Move your sore joints throughout the day. GET HELP RIGHT AWAY IF:  You have a fever. You have very bad joint pain, puffiness, or redness. You have many joints that are painful and puffy. You are not getting better with treatment. You have very bad back pain or leg weakness. You cannot control when you poop (bowel movement) or pee (urinate). You do not feel better in 24 hours or are getting worse. You are having side effects from your medicine. MAKE SURE YOU:  Understand these instructions. Will watch your condition. Will get help right away if you are not doing well or get worse. Document Released: 01/30/2010 Document Revised: 05/06/2012 Document Reviewed: 01/30/2010 [facility] Patient Information 2014 [place], [place].

## 2013-12-16 LAB — URINALYSIS, ROUTINE W REFLEX MICROSCOPIC
Bilirubin Urine: NEGATIVE
GLUCOSE, UA: NEGATIVE mg/dL
Hgb urine dipstick: NEGATIVE
Ketones, ur: NEGATIVE mg/dL
LEUKOCYTES UA: NEGATIVE
NITRITE: NEGATIVE
PH: 7 (ref 5.0–8.0)
Protein, ur: NEGATIVE mg/dL
Specific Gravity, Urine: 1.008 (ref 1.005–1.030)
Urobilinogen, UA: 0.2 mg/dL (ref 0.0–1.0)

## 2013-12-16 LAB — URINE CULTURE
COLONY COUNT: NO GROWTH
Organism ID, Bacteria: NO GROWTH

## 2013-12-16 LAB — MICROALBUMIN / CREATININE URINE RATIO
CREATININE, URINE: 46.8 mg/dL
Microalb Creat Ratio: 17.3 mg/g (ref 0.0–30.0)
Microalb, Ur: 0.81 mg/dL (ref 0.00–1.89)

## 2013-12-16 LAB — INSULIN, FASTING: INSULIN FASTING, SERUM: 2 u[IU]/mL — AB (ref 3–28)

## 2013-12-16 LAB — CYCLIC CITRUL PEPTIDE ANTIBODY, IGG: Cyclic Citrullin Peptide Ab: <2 U/mL (ref 0.0–5.0)

## 2013-12-17 NOTE — Progress Notes (Signed)
LMOM TO CALL & SCHEDULE  

## 2013-12-29 ENCOUNTER — Ambulatory Visit
Admission: RE | Admit: 2013-12-29 | Discharge: 2013-12-29 | Disposition: A | Discharge status: Home / Self Care | Payer: Medicare Other | Source: Ambulatory Visit | Attending: Emergency Medicine | Admitting: Emergency Medicine

## 2013-12-29 DIAGNOSIS — R5381 Other malaise: Secondary | ICD-10-CM

## 2013-12-29 DIAGNOSIS — R059 Cough, unspecified: Secondary | ICD-10-CM | POA: Diagnosis not present

## 2013-12-29 DIAGNOSIS — M47812 Spondylosis without myelopathy or radiculopathy, cervical region: Secondary | ICD-10-CM | POA: Diagnosis not present

## 2013-12-29 DIAGNOSIS — J45909 Unspecified asthma, uncomplicated: Secondary | ICD-10-CM

## 2013-12-29 DIAGNOSIS — R5383 Other fatigue: Secondary | ICD-10-CM

## 2013-12-29 DIAGNOSIS — R200 Anesthesia of skin: Secondary | ICD-10-CM

## 2013-12-29 DIAGNOSIS — R05 Cough: Secondary | ICD-10-CM | POA: Diagnosis not present

## 2014-01-16 ENCOUNTER — Other Ambulatory Visit: Payer: Self-pay | Admitting: Internal Medicine

## 2014-01-18 ENCOUNTER — Other Ambulatory Visit: Payer: Self-pay | Admitting: Internal Medicine

## 2014-02-04 DIAGNOSIS — H04129 Dry eye syndrome of unspecified lacrimal gland: Secondary | ICD-10-CM | POA: Diagnosis not present

## 2014-02-04 DIAGNOSIS — H524 Presbyopia: Secondary | ICD-10-CM | POA: Diagnosis not present

## 2014-02-04 DIAGNOSIS — H52229 Regular astigmatism, unspecified eye: Secondary | ICD-10-CM | POA: Diagnosis not present

## 2014-02-04 DIAGNOSIS — H5210 Myopia, unspecified eye: Secondary | ICD-10-CM | POA: Diagnosis not present

## 2014-02-10 ENCOUNTER — Encounter: Payer: Self-pay | Admitting: Physician Assistant

## 2014-02-10 ENCOUNTER — Ambulatory Visit (INDEPENDENT_AMBULATORY_CARE_PROVIDER_SITE_OTHER): Payer: Medicare Other | Admitting: Physician Assistant

## 2014-02-10 VITALS — BP 102/68 | HR 100 | Temp 99.3°F | Resp 16 | Ht 65.5 in | Wt 110.0 lb

## 2014-02-10 DIAGNOSIS — J0100 Acute maxillary sinusitis, unspecified: Secondary | ICD-10-CM | POA: Diagnosis not present

## 2014-02-10 DIAGNOSIS — Z0000 Encounter for general adult medical examination without abnormal findings: Secondary | ICD-10-CM

## 2014-02-10 DIAGNOSIS — Z1331 Encounter for screening for depression: Secondary | ICD-10-CM

## 2014-02-10 MED ORDER — AZITHROMYCIN 250 MG PO TABS: ORAL_TABLET | ORAL | Status: DC

## 2014-02-10 MED ORDER — PREDNISONE 20 MG PO TABS: ORAL_TABLET | ORAL | Status: DC

## 2014-02-10 NOTE — Progress Notes (Signed)
Subjective:   Debbie Houston is a 73 y.o. female who presents for Medicare Annual Wellness Visit and has had cold symptoms since Friday.  Date of last medicare wellness visit is unknown.   Her blood pressure has been controlled at home, today their BP is BP: 102/68 mmHg She does workout. She denies chest pain, shortness of breath, dizziness.  She has had a low grade temperature, sinus pressure, rhinorrhea, cough with yellow production since Friday, not getting better. Denies wheezing, SOB, nasuea. Has been on mucinex and sinus decongestant without help.   Names of Other Physician/Practitioners you currently use: 1. [facility]- here for primary care 2. Dr. Sabra Heck, eye doctor, last visit 01/31/2014 3. , dentist, last visit 08/2013 Patient Care Team: Unk Pinto, MD as PCP - General (Internal Medicine)  Medication Review Current Outpatient Prescriptions on File Prior to Visit  Medication Sig Dispense Refill  . aspirin 81 MG tablet Take 81 mg by mouth daily.      . Biotin 1 MG CAPS Take 1 mg by mouth daily.      . budesonide (ENTOCORT EC) 3 MG 24 hr capsule Take 3 mg by mouth daily.      . Calcium Carbonate (CALCIUM 600 PO) Take by mouth daily.      . cetirizine (ZYRTEC) 10 MG tablet Take 10 mg by mouth daily.      . cholecalciferol (VITAMIN D) 1000 UNITS tablet Take 2,000 Units by mouth daily.      . fish oil-omega-3 fatty acids 1000 MG capsule Take 2 g by mouth daily.      . Flaxseed, Linseed, (FLAX SEED OIL) 1000 MG CAPS Take 1,000 mg by mouth daily.      Marland Kitchen levothyroxine (SYNTHROID, LEVOTHROID) 125 MCG tablet TAKE ONE TABLET BY MOUTH DAILY  90 tablet  1  . Multiple Vitamins-Minerals (MULTIVITAMIN WITH MINERALS) tablet Take 1 tablet by mouth daily.      . Probiotic Product (PROBIOTIC DAILY PO) Take by mouth daily.      . simvastatin (ZOCOR) 20 MG tablet TAKE 1 TABLET BY MOUTH ONCE DAILY  30 tablet  3  . tamoxifen (NOLVADEX) 20 MG tablet TAKE 1  TABLET BY MOUTH DAILY FOR BONES  90 tablet  0  . triamcinolone (NASACORT) 55 MCG/ACT AERO nasal inhaler Place 2 sprays into the nose at bedtime as needed.      . vitamin C (ASCORBIC ACID) 500 MG tablet Take 500 mg by mouth daily.       No current facility-administered medications on file prior to visit.    Current Problems (verified) Patient Active Problem List   Diagnosis Date Noted  . Benign labile hypertension   . Hyperlipidemia   . Hypothyroid   . GERD (gastroesophageal reflux disease)   . Asthma   . Arthritis   . DJD (degenerative joint disease)   . Vitamin D deficiency   . Osteopenia   . Allergy   . Anemia     Screening Tests Health Maintenance  Topic Date Due  . Colonoscopy  08/15/1991  . Influenza Vaccine  06/19/2013  . Mammogram  10/01/2014  . Tetanus/tdap  08/21/2021  . Pneumococcal Polysaccharide Vaccine Age 30 And Over  Completed  . Zostavax  Completed     Immunization History  Administered Date(s) Administered  . Pneumococcal-Unspecified 08/22/2011  . Tdap 08/22/2011  . Zoster 06/20/2006    Preventative care: Last colonoscopy: Dr. Watt Climes 2011, due 2016 Last mammogram: 09/2013 Last pap smear/pelvic  exam: 2012 DEXA:09/2012 osteopenia  Prior vaccinations: TD or Tdap: 2012  Influenza: 08/2013  Pneumococcal: 2012 Shingles/Zostavax: 2007  History reviewed: allergies, current medications, past family history, past medical history, past social history, past surgical history and problem list  Risk Factors: Osteoporosis: postmenopausal estrogen deficiency and dietary calcium and/or vitamin D deficiency History of fracture in the past year: no  Tobacco History  Substance Use Topics  . Smoking status: Light Tobacco Smoker -- 15 years    Types: Cigarettes  . Smokeless tobacco: Not on file     Comment: RARELY SMOKES  . Alcohol Use: Yes     Comment: OCCASIONAL   She does not smoke.  Patient is a former smoker. Are there smokers in your home (other  than you)?  No  Alcohol Current alcohol use: none  Caffeine Current caffeine use: denies use  Exercise Exercise limitations: The patient has no exercise limitations. Current exercise: aerobics, swimming, walking and yard work  Nutrition/Diet Current diet: in general, a "healthy" diet    Cardiac risk factors: advanced age (older than 5 for men, 16 for women) and hypertension.  Depression Screen Nurse depression screen reviewed.  (Note: if answer to either of the following is "Yes", a more complete depression screening is indicated)   Q1: Over the past two weeks, have you felt down, depressed or hopeless? No  Q2: Over the past two weeks, have you felt little interest or pleasure in doing things? No  Have you lost interest or pleasure in daily life? No  Do you often feel hopeless? No  Do you cry easily over simple problems? No  Activities of Daily Living Nurse ADLs screen reviewed.  In your present state of health, do you have any difficulty performing the following activities?:  Driving? No Managing money?  No Feeding yourself? No Getting from bed to chair? No Climbing a flight of stairs? No Preparing food and eating?: No Bathing or showering? No Getting dressed: No Getting to the toilet? No Using the toilet:No Moving around from place to place: No In the past year have you fallen or had a near fall?:No   Are you sexually active?  Yes  Do you have more than one partner?  No  Vision Difficulties: No  Hearing Difficulties: No Do you often ask people to speak up or repeat themselves? No Do you experience ringing or noises in your ears? No Do you have difficulty understanding soft or whispered voices? No  Cognition  Do you feel that you have a problem with memory?No  Do you often misplace items? No  Do you feel safe at home?  Yes  Advanced directives Does patient have a [place]? Yes Does patient have a Living Will? Yes   Objective:      Vision and hearing screens reviewed.   Blood pressure 102/68, pulse 100, temperature 99.3 F (37.4 C), resp. rate 16, height 5' 5.5" (1.664 m), weight 110 lb (49.896 kg). Body mass index is 18.02 kg/(m^2).  General appearance: alert, no distress, WD/WN,  female Cognitive Testing  Alert? Yes  Normal Appearance?Yes  Oriented to person? Yes  Place? Yes   Time? Yes  Recall of three objects?  Yes  Can perform simple calculations? Yes  Displays appropriate judgment?Yes  Can read the correct time from a watch face?Yes  HEENT: normocephalic, sclerae anicteric, TMs pearly with mideffusion in left ear, nares patent, no discharge or erythema, pharynx normal Oral cavity: MMM, no lesions Neck: supple, mild right sided  anterior cervical lymphadenopathy, no thyromegaly, no masses Heart: RRR, normal S1, S2, no murmurs Lungs: CTA bilaterally, no wheezes, rhonchi, or rales Abdomen: +bs, soft, flat, non tender, non distended, no masses, no hepatomegaly, no splenomegaly Musculoskeletal: nontender, no swelling, no obvious deformity. Left knee + crepitus, no laxity.  Extremities: no edema, no cyanosis, no clubbing Pulses: 2+ symmetric, upper and lower extremities, normal cap refill Neurological: alert, oriented x 3, CN2-12 intact, strength normal upper extremities and lower extremities, sensation normal throughout, DTRs 2+ throughout, no cerebellar signs, gait normal Psychiatric: normal affect, behavior normal, pleasant  Breast: defer Gyn: defer Rectal: defer   Assessment:   Left knee- meniscal tear- does not want surgery/referral at this time- given PT/exercises at home, RICE Acute maxillary sinusitis - Plan: azithromycin (ZITHROMAX) 250 MG tablet, predniSONE (DELTASONE) 20 MG tablet  Plan:   During the course of the visit the patient was educated and counseled about appropriate screening and preventive services including:    Influenza vaccine  Screening electrocardiogram  Screening  mammography  Bone densitometry screening  Colorectal cancer screening  Diabetes screening  Glaucoma screening  Nutrition counseling   Screening recommendations, referrals:  Vaccinations: Tdap vaccine no  Influenza vaccine no Pneumococcal vaccine no Shingles vaccine no Hep B vaccine no  Nutrition assessed and recommended  Colonoscopy yes Mammogram yes Pap smear no Pelvic exam no Recommended yearly ophthalmology/optometry visit for glaucoma screening and checkup Recommended yearly dental visit for hygiene and checkup Advanced directives - yes  Conditions/risks identified: BMI: Discussed weight loss, diet, and increase physical activity.  Increase physical activity: AHA recommends 150 minutes of physical activity a week.  Medications reviewed DEXA- Due 09/2014 Urinary Incontinence is not an issue: discussed non pharmacology and pharmacology options.  Fall risk: low- discussed PT, home fall assessment, medications.   Medicare Attestation I have personally reviewed: The patient's medical and social history Their use of alcohol, tobacco or illicit drugs Their current medications and supplements The patient's functional ability including ADLs,fall risks, home safety risks, cognitive, and hearing and visual impairment Diet and physical activities Evidence for depression or mood disorders  The patient's weight, height, BMI, and visual acuity have been recorded in the chart.  I have made referrals, counseling, and provided education to the patient based on review of the above and I have provided the patient with a written personalized care plan for preventive services.     Vicie Mutters, PA-C   02/10/2014

## 2014-02-10 NOTE — Patient Instructions (Signed)
Meniscus Tear with Phase I Rehab The meniscus is a C-shaped cartilage structure, located in the knee joint between the thigh bone (femur) and the shinbone (tibia). Two menisci are located in each knee joint: the inner and outer meniscus. The meniscus acts as an adapter between the thigh bone and shinbone, allowing them to fit properly together. It also functions as a shock absorber, to reduce the stress placed on the knee joint and to help supply nutrients to the knee joint cartilage. As people age, the meniscus begins to harden and become more vulnerable to injury. Meniscus tears are a common injury, especially in older athletes. Inner meniscus tears are more common than outer meniscus tears.  SYMPTOMS   Pain in the knee, especially with standing or squatting with the affected leg.  Tenderness along the joint line.  Swelling in the knee joint (effusion), usually starting 1 to 2 days after injury.  Locking or catching of the knee joint, causing inability to straighten the knee completely.  Giving way or buckling of the knee. CAUSES  A meniscus tear occurs when a force is placed on the meniscus that is greater than it can handle. Common causes of injury include:  Direct hit (trauma) to the knee.  Twisting, pivoting, or cutting (rapidly changing direction while running), kneeling or squatting.  Without injury, due to aging. RISK INCREASES WITH:  Contact sports (football, rugby).  Sports in which cleats are used with pivoting (soccer, lacrosse) or sports in which good shoe grip and sudden change in direction are required (racquetball, basketball, squash).  Previous knee injury.  Associated knee injury, particularly ligament injuries.  Poor strength and flexibility. PREVENTION  Warm up and stretch properly before activity.  Maintain physical fitness:  Strength, flexibility, and endurance.  Cardiovascular fitness.  Protect the knee with a brace or elastic bandage.  Wear  properly fitted protective equipment (proper cleats for the surface). PROGNOSIS  Sometimes, meniscus tears heal on their own. However, definitive treatment requires surgery, followed by at least 6 weeks of recovery.  RELATED COMPLICATIONS   Recurring symptoms that result in a chronic problem.  Repeated knee injury, especially if sports are resumed too soon after injury or surgery.  Progression of the tear (the tear gets larger), if untreated.  Arthritis of the knee in later years (with or without surgery).  Complications of surgery, including infection, bleeding, injury to nerves (numbness, weakness, paralysis) continued pain, giving way, locking, nonhealing of meniscus (if repaired), need for further surgery, and knee stiffness (loss of motion). TREATMENT  Treatment first involves the use of ice and medicine, to reduce pain and inflammation. You may find using crutches to walk more comfortable. However, it is okay to bear weight on the injured knee, if the pain will allow it. Surgery is often advised as a definitive treatment. Surgery is performed through an incision near the joint (arthroscopically). The torn piece of the meniscus is removed, and if possible the joint cartilage is repaired. After surgery, the joint must be restrained. After restraint, it is important to perform strengthening and stretching exercises to help regain strength and a full range of motion. These exercises may be completed at home or with a therapist.  MEDICATION  If pain medicine is needed, nonsteroidal anti-inflammatory medicines (aspirin and ibuprofen), or other minor pain relievers (acetaminophen), are often advised.  Do not take pain medicine for 7 days before surgery.  Prescription pain relievers may be given, if your caregiver thinks they are needed. Use only as directed and  only as much as you need. HEAT AND COLD  Cold treatment (icing) should be applied for 10 to 15 minutes every 2 to 3 hours for  inflammation and pain, and immediately after activity that aggravates your symptoms. Use ice packs or an ice massage.  Heat treatment may be used before performing stretching and strengthening activities prescribed by your caregiver, physical therapist, or athletic trainer. Use a heat pack or a warm water soak. SEEK MEDICAL CARE IF:   Symptoms get worse or do not improve in 2 weeks, despite treatment.  New, unexplained symptoms develop. (Drugs used in treatment may produce side effects.) EXERCISES RANGE OF MOTION (ROM) AND STRETCHING EXERCISES - Meniscus Tear, Non-operative, Phase I These are some of the initial exercises with which you may start your rehabilitation program, until you see your caregiver again or until your symptoms are resolved. Remember:   These initial exercises are intended to be gentle. They will help you restore motion without increasing any swelling.  Completing these exercises allows less painful movement and prepares you for the more aggressive strengthening exercises in Phase II.  An effective stretch should be held for at least 30 seconds.  A stretch should never be painful. You should only feel a gentle lengthening or release in the stretched tissue. RANGE OF MOTION - Knee Flexion, Active  Lie on your back with both knees straight. (If this causes back discomfort, bend your healthy knee, placing your foot flat on the floor.)  Slowly slide your heel back toward your buttocks until you feel a gentle stretch in the front of your knee or thigh.  Hold for __________ seconds. Slowly slide your heel back to the starting position. Repeat __________ times. Complete this exercise __________ times per day.  RANGE OF MOTION - Knee Flexion and Extension, Active-Assisted  Sit on the edge of a table or chair with your thighs firmly supported. It may be helpful to place a folded towel under the end of your right / left thigh.  Flexion (bending): Place the ankle of your  healthy leg on top of the other ankle. Use your healthy leg to gently bend your right / left knee until you feel a mild tension across the top of your knee.  Hold for __________ seconds.  Extension (straightening): Switch your ankles so your right / left leg is on top. Use your healthy leg to straighten your right / left knee until you feel a mild tension on the backside of your knee.  Hold for __________ seconds. Repeat __________ times. Complete __________ times per day. STRETCH - Knee Flexion, Supine  Lie on the floor with your right / left heel and foot lightly touching the wall. (Place both feet on the wall if you do not use a door frame.)  Without using any effort, allow gravity to slide your foot down the wall slowly until you feel a gentle stretch in the front of your right / left knee.  Hold this stretch for __________ seconds. Then return the leg to the starting position, using your healthy leg for help, if needed. Repeat __________ times. Complete this stretch __________ times per day.  STRETCH - Knee Extension Sitting  Sit with your right / left leg/heel propped on another chair, coffee table, or foot stool.  Allow your leg muscles to relax, letting gravity straighten out your knee.*  You should feel a stretch behind your right / left knee. Hold this position for __________ seconds. Repeat __________ times. Complete this stretch __________  times per day.  *Your physician, physical therapist or athletic trainer may instruct you place a __________ weight on your thigh, just above your kneecap, to deepen the stretch.  STRENGTHENING EXERCISES - Meniscus Tear, Non-operative, Phase I These exercises may help you when beginning to rehabilitate your injury. They may resolve your symptoms with or without further involvement from your physician, physical therapist or athletic trainer. While completing these exercises, remember:   Muscles can gain both the endurance and the strength  needed for everyday activities through controlled exercises.  Complete these exercises as instructed by your physician, physical therapist or athletic trainer. Progress the resistance and repetitions only as guided. STRENGTH - Quadriceps, Isometrics  Lie on your back with your right / left leg extended and your opposite knee bent.  Gradually tense the muscles in the front of your right / left thigh. You should see either your knee cap slide up toward your hip or increased dimpling just above the knee. This motion will push the back of the knee down toward the floor, mat, or bed on which you are lying.  Hold the muscle as tight as you can, without increasing your pain, for __________ seconds.  Relax the muscles slowly and completely between each repetition. Repeat __________ times. Complete this exercise __________ times per day.  STRENGTH - Quadriceps, Short Arcs   Lie on your back. Place a __________ inch towel roll under your right / left knee, so that the knee bends slightly.  Raise only your lower leg by tightening the muscles in the front of your thigh. Do not allow your thigh to rise.  Hold this position for __________ seconds. Repeat __________ times. Complete this exercise __________ times per day.  OPTIONAL ANKLE WEIGHTS: Begin with ____________________, but DO NOT exceed ____________________. Increase in 1 pound/0.5 kilogram increments. STRENGTH - Quadriceps, Straight Leg Raises  Quality counts! Watch for signs that the quadriceps muscle is working, to be sure you are strengthening the correct muscles and not "cheating" by substituting with healthier muscles.  Lay on your back with your right / left leg extended and your opposite knee bent.  Tense the muscles in the front of your right / left thigh. You should see either your knee cap slide up or increased dimpling just above the knee. Your thigh may even shake a bit.  Tighten these muscles even more and raise your leg 4 to 6  inches off the floor. Hold for __________ seconds.  Keeping these muscles tense, lower your leg.  Relax the muscles slowly and completely in between each repetition. Repeat __________ times. Complete this exercise __________ times per day.  STRENGTH - Hamstring, Curls   Lay on your stomach with your legs extended. (If you lay on a bed, your feet may hang over the edge.)  Tighten the muscles in the back of your thigh to bend your right / left knee up to 90 degrees. Keep your hips flat on the bed.  Hold this position for __________ seconds.  Slowly lower your leg back to the starting position. Repeat __________ times. Complete this exercise __________ times per day.  STRENGTH  Quadriceps, Squats  Stand in a door frame so that your feet and knees are in line with the frame.  Use your hands for balance, not support, on the frame.  Slowly lower your weight, bending at the hips and knees. Keep your lower legs upright so that they are parallel with the door frame. Squat only within the range that does  not increase your knee pain. Never let your hips drop below your knees.  Slowly return upright, pushing with your legs, not pulling with your hands. Repeat __________ times. Complete this exercise __________ times per day.  STRENGTH - Quad/VMO, Isometric   Sit in a chair with your right / left knee slightly bent. With your fingertips, feel the VMO muscle just above the inside of your knee. The VMO is important in controlling the position of your kneecap.  Keeping your fingertips on this muscle. Without actually moving your leg, attempt to drive your knee down as if straightening your leg. You should feel your VMO tense. If you have a difficult time, you may wish to try the same exercise on your healthy knee first.  Tense this muscle as hard as you can without increasing any knee pain.  Hold for __________ seconds. Relax the muscles slowly and completely in between each repetition. Repeat  __________ times. Complete exercise __________ times per day.  Document Released: 11/19/1998 Document Revised: 01/28/2012 Document Reviewed: 02/17/2009 [facility] Patient Information 2014 [place], [place].

## 2014-03-03 DIAGNOSIS — M79609 Pain in unspecified limb: Secondary | ICD-10-CM | POA: Diagnosis not present

## 2014-03-03 DIAGNOSIS — I8310 Varicose veins of unspecified lower extremity with inflammation: Secondary | ICD-10-CM | POA: Diagnosis not present

## 2014-03-09 DIAGNOSIS — I8310 Varicose veins of unspecified lower extremity with inflammation: Secondary | ICD-10-CM | POA: Diagnosis not present

## 2014-03-29 ENCOUNTER — Ambulatory Visit: Payer: Self-pay | Admitting: Internal Medicine

## 2014-04-20 ENCOUNTER — Encounter: Payer: Self-pay | Admitting: Internal Medicine

## 2014-04-20 ENCOUNTER — Ambulatory Visit (INDEPENDENT_AMBULATORY_CARE_PROVIDER_SITE_OTHER): Payer: Medicare Other | Admitting: Internal Medicine

## 2014-04-20 VITALS — BP 112/72 | HR 72 | Temp 98.8°F | Resp 16 | Ht 65.5 in | Wt 111.6 lb

## 2014-04-20 DIAGNOSIS — I10 Essential (primary) hypertension: Secondary | ICD-10-CM | POA: Diagnosis not present

## 2014-04-20 DIAGNOSIS — R7303 Prediabetes: Secondary | ICD-10-CM | POA: Insufficient documentation

## 2014-04-20 DIAGNOSIS — Z79899 Other long term (current) drug therapy: Secondary | ICD-10-CM | POA: Diagnosis not present

## 2014-04-20 DIAGNOSIS — R7309 Other abnormal glucose: Secondary | ICD-10-CM | POA: Diagnosis not present

## 2014-04-20 DIAGNOSIS — E559 Vitamin D deficiency, unspecified: Secondary | ICD-10-CM | POA: Diagnosis not present

## 2014-04-20 DIAGNOSIS — E785 Hyperlipidemia, unspecified: Secondary | ICD-10-CM | POA: Diagnosis not present

## 2014-04-20 LAB — CBC WITH DIFFERENTIAL/PLATELET
BASOS PCT: 0 % (ref 0–1)
Basophils Absolute: 0 10*3/uL (ref 0.0–0.1)
Eosinophils Absolute: 0.1 10*3/uL (ref 0.0–0.7)
Eosinophils Relative: 1 % (ref 0–5)
HEMATOCRIT: 40.7 % (ref 36.0–46.0)
Hemoglobin: 13.7 g/dL (ref 12.0–15.0)
LYMPHS PCT: 23 % (ref 12–46)
Lymphs Abs: 1.7 10*3/uL (ref 0.7–4.0)
MCH: 31.5 pg (ref 26.0–34.0)
MCHC: 33.7 g/dL (ref 30.0–36.0)
MCV: 93.6 fL (ref 78.0–100.0)
MONO ABS: 0.4 10*3/uL (ref 0.1–1.0)
MONOS PCT: 6 % (ref 3–12)
NEUTROS ABS: 5 10*3/uL (ref 1.7–7.7)
Neutrophils Relative %: 70 % (ref 43–77)
Platelets: 172 10*3/uL (ref 150–400)
RBC: 4.35 MIL/uL (ref 3.87–5.11)
RDW: 14.8 % (ref 11.5–15.5)
WBC: 7.2 10*3/uL (ref 4.0–10.5)

## 2014-04-20 NOTE — Progress Notes (Signed)
Patient ID: [name], female   DOB: [birth year], 73 y.o.   MRN: [id number]    This very nice 73 y.o.MWF presents for 3 month follow up with Hypertension, Hyperlipidemia and Vitamin D Deficiency.    Labile HTN predates several years and is monitored expectantly. BP has been controlled and today's BP: 112/72 mmHg. Patient denies any cardiac type chest pain, palpitations, dyspnea/orthopnea/PND, dizziness, claudication, or dependent edema.   Hyperlipidemia is controlled with diet & meds. Last Lipids in Jan 2015 were at goal as below. Patient denies myalgias or other med SE's.  Lab Results  Component Value Date   CHOL 181 12/15/2013   HDL 71 12/15/2013   LDLCALC 95 12/15/2013   TRIG 74 12/15/2013   CHOLHDL 2.5 12/15/2013    Also, the patient is screened for PreDiabetes and  last A1c was 5.8% in Jan 2015. Patient denies any symptoms of reactive hypoglycemia, diabetic polys, paresthesias or visual blurring.   Further, Patient has history of Vitamin D Deficiency with last vitamin D of 61 in Aug 2014. Patient supplements vitamin D without any suspected side-effects.   Medication List   aspirin 81 MG tablet  Take 81 mg by mouth daily.     Biotin 1 MG Caps  Take 1 mg by mouth daily.     budesonide 3 MG 24 hr capsule  Commonly known as:  ENTOCORT EC  Take 3 mg by mouth daily.     CALCIUM 600 PO  Take by mouth daily.     cetirizine 10 MG tablet  Commonly known as:  ZYRTEC  Take 10 mg by mouth daily.     cholecalciferol 1000 UNITS tablet  Commonly known as:  VITAMIN D  Take 2,000 Units by mouth daily.     fish oil-omega-3 fatty acids 1000 MG capsule  Take 2 g by mouth daily.     Flax Seed Oil 1000 MG Caps  Take 1,000 mg by mouth daily.     levothyroxine 125 MCG tablet  Commonly known as:  SYNTHROID, LEVOTHROID  TAKE ONE TABLET BY MOUTH DAILY     multivitamin with minerals tablet  Take 1 tablet by mouth daily.     PROBIOTIC DAILY PO  Take by mouth daily.     simvastatin 20 MG  tablet  Commonly known as:  ZOCOR  TAKE 1 TABLET BY MOUTH ONCE DAILY     tamoxifen 20 MG tablet  Commonly known as:  NOLVADEX  TAKE 1 TABLET BY MOUTH DAILY FOR BONES     triamcinolone 55 MCG/ACT Aero nasal inhaler  Commonly known as:  NASACORT  Place 2 sprays into the nose at bedtime as needed.     vitamin C 500 MG tablet  Commonly known as:  ASCORBIC ACID  Take 500 mg by mouth daily.        Allergies  Allergen Reactions  . Fosamax [Alendronate Sodium]     Gi uPSET  . Ppd [Tuberculin Purified Protein Derivative]     POSITIVE TITER HX  . Singulair [Montelukast Sodium]     JITTERS  . Wellbutrin [Bupropion] Hives  . Sulfa Antibiotics Rash   PMHx:   Past Medical History  Diagnosis Date  . Hyperlipidemia   . Hypothyroid   . GERD (gastroesophageal reflux disease)   . Asthma   . Arthritis   . DJD (degenerative joint disease)   . Osteopenia   . Vitamin D deficiency   . Allergy   . Colitis   . Anemia   .  Benign labile hypertension    FHx:    Reviewed / unchanged  SHx:    Reviewed / unchanged   Systems Review: Constitutional: Denies fever, chills, wt changes, headaches, insomnia, fatigue, night sweats, change in appetite. Eyes: Denies redness, blurred vision, diplopia, discharge, itchy, watery eyes.  ENT: Denies discharge, congestion, post nasal drip, epistaxis, sore throat, earache, hearing loss, dental pain, tinnitus, vertigo, sinus pain, snoring.  CV: Denies chest pain, palpitations, irregular heartbeat, syncope, dyspnea, diaphoresis, orthopnea, PND, claudication or edema. Respiratory: denies cough, dyspnea, DOE, pleurisy, hoarseness, laryngitis, wheezing.  Gastrointestinal: Denies dysphagia, odynophagia, heartburn, reflux, water brash, abdominal pain or cramps, nausea, vomiting, bloating, diarrhea, constipation, hematemesis, melena, hematochezia  or hemorrhoids. Genitourinary: Denies dysuria, frequency, urgency, nocturia, hesitancy, discharge, hematuria or flank  pain. Musculoskeletal: Denies arthralgias, myalgias, stiffness, jt. swelling, pain, limping or strain/sprain.  Skin: Denies pruritus, rash, hives, warts, acne, eczema or change in skin lesion(s). Neuro: No weakness, tremor, incoordination, spasms, paresthesia or pain. Psychiatric: Denies confusion, memory loss or sensory loss. Endo: Denies change in weight, skin or hair change.  Heme/Lymph: No excessive bleeding, bruising or enlarged lymph nodes.   Exam:  BP 112/72  P 72  T 98.8 F R 16  Ht 5' 5.5"   Wt 111 lb   BMI 18.28 kg/m2  Appears well nourished - in no distress. Eyes: PERRLA, EOMs, conjunctiva no swelling or erythema. Sinuses: No frontal/maxillary tenderness ENT/Mouth: EAC's clear, TM's nl w/o erythema, bulging. Nares clear w/o erythema, swelling, exudates. Oropharynx clear without erythema or exudates. Oral hygiene is good. Tongue normal, non obstructing. Hearing intact.  Neck: Supple. Thyroid nl. Car 2+/2+ without bruits, nodes or JVD. Chest: Respirations nl with BS clear & equal w/o rales, rhonchi, wheezing or stridor.  Cor: Heart sounds normal w/ regular rate and rhythm without sig. murmurs, gallops, clicks, or rubs. Peripheral pulses normal and equal  without edema.  Abdomen: Soft & bowel sounds normal. Non-tender w/o guarding, rebound, hernias, masses, or organomegaly.  Lymphatics: Unremarkable.  Musculoskeletal: Full ROM all peripheral extremities, joint stability, 5/5 strength, and normal gait.  Skin: Warm, dry without exposed rashes, lesions or ecchymosis apparent.  Neuro: Cranial nerves intact, reflexes equal bilaterally. Sensory-motor testing grossly intact. Tendon reflexes grossly intact.  Pysch: Alert & oriented x 3. Insight and judgement nl & appropriate. No ideations.  Assessment and Plan:  1. Labile Hypertension - Continue monitor blood pressure at home. Continue diet same.  2. Hyperlipidemia - Continue diet/meds, exercise,& lifestyle modifications.  Continue monitor periodic cholesterol/liver & renal functions   3. Pre-diabetes - Continue diet, exercise, lifestyle modifications. Monitor appropriate labs.  4. Vitamin D Deficiency - Continue supplementation.  Recommended regular exercise, BP monitoring, weight control, and discussed med and SE's. Recommended labs to assess and monitor clinical status. Further disposition pending results of labs.

## 2014-04-20 NOTE — Patient Instructions (Signed)

## 2014-04-21 LAB — BASIC METABOLIC PANEL WITH GFR
BUN: 13 mg/dL (ref 6–23)
CHLORIDE: 105 meq/L (ref 96–112)
CO2: 25 mEq/L (ref 19–32)
Calcium: 9 mg/dL (ref 8.4–10.5)
Creat: 0.72 mg/dL (ref 0.50–1.10)
GFR, Est African American: >89 mL/min
GFR, Est Non African American: 84 mL/min
GLUCOSE: 80 mg/dL (ref 70–99)
Potassium: 4.6 mEq/L (ref 3.5–5.3)
Sodium: 141 mEq/L (ref 135–145)

## 2014-04-21 LAB — HEPATIC FUNCTION PANEL
ALT: 14 U/L (ref 0–35)
AST: 20 U/L (ref 0–37)
Albumin: 4.1 g/dL (ref 3.5–5.2)
Alkaline Phosphatase: 32 U/L — ABNORMAL LOW (ref 39–117)
BILIRUBIN INDIRECT: 0.3 mg/dL (ref 0.2–1.2)
Bilirubin, Direct: 0.1 mg/dL (ref 0.0–0.3)
TOTAL PROTEIN: 5.9 g/dL — AB (ref 6.0–8.3)
Total Bilirubin: 0.4 mg/dL (ref 0.2–1.2)

## 2014-04-21 LAB — HEMOGLOBIN A1C
HEMOGLOBIN A1C: 5.9 % — AB (ref <5.7)
Mean Plasma Glucose: 123 mg/dL — ABNORMAL HIGH (ref <117)

## 2014-04-21 LAB — INSULIN, FASTING: Insulin fasting, serum: 3 u[IU]/mL (ref 3–28)

## 2014-04-21 LAB — LIPID PANEL
Cholesterol: 157 mg/dL (ref 0–200)
HDL: 55 mg/dL (ref >39)
LDL Cholesterol: 86 mg/dL (ref 0–99)
Total CHOL/HDL Ratio: 2.9 Ratio
Triglycerides: 80 mg/dL (ref <150)
VLDL: 16 mg/dL (ref 0–40)

## 2014-04-21 LAB — TSH: TSH: 2.809 u[IU]/mL (ref 0.350–4.500)

## 2014-04-21 LAB — MAGNESIUM: Magnesium: 1.8 mg/dL (ref 1.5–2.5)

## 2014-04-21 LAB — VITAMIN D 25 HYDROXY (VIT D DEFICIENCY, FRACTURES): Vit D, 25-Hydroxy: 70 ng/mL (ref 30–89)

## 2014-04-26 ENCOUNTER — Other Ambulatory Visit: Payer: Self-pay | Admitting: Physician Assistant

## 2014-04-28 DIAGNOSIS — K2100 Gastro-esophageal reflux disease with esophagitis, without bleeding: Secondary | ICD-10-CM | POA: Diagnosis not present

## 2014-04-28 DIAGNOSIS — K5289 Other specified noninfective gastroenteritis and colitis: Secondary | ICD-10-CM | POA: Diagnosis not present

## 2014-05-24 DIAGNOSIS — B079 Viral wart, unspecified: Secondary | ICD-10-CM | POA: Diagnosis not present

## 2014-05-24 DIAGNOSIS — L84 Corns and callosities: Secondary | ICD-10-CM | POA: Diagnosis not present

## 2014-05-24 DIAGNOSIS — Z85828 Personal history of other malignant neoplasm of skin: Secondary | ICD-10-CM | POA: Diagnosis not present

## 2014-05-24 DIAGNOSIS — L821 Other seborrheic keratosis: Secondary | ICD-10-CM | POA: Diagnosis not present

## 2014-05-24 DIAGNOSIS — M7130 Other bursal cyst, unspecified site: Secondary | ICD-10-CM | POA: Diagnosis not present

## 2014-06-01 ENCOUNTER — Ambulatory Visit (HOSPITAL_COMMUNITY)
Admission: RE | Admit: 2014-06-01 | Discharge: 2014-06-01 | Disposition: A | Discharge status: Home / Self Care | Payer: Medicare Other | Source: Ambulatory Visit | Attending: Physician Assistant | Admitting: Physician Assistant

## 2014-06-01 ENCOUNTER — Ambulatory Visit (INDEPENDENT_AMBULATORY_CARE_PROVIDER_SITE_OTHER): Payer: Medicare Other | Admitting: Physician Assistant

## 2014-06-01 ENCOUNTER — Encounter: Payer: Self-pay | Admitting: Physician Assistant

## 2014-06-01 VITALS — BP 130/78 | HR 84 | Temp 98.0°F | Resp 16 | Wt 109.0 lb

## 2014-06-01 DIAGNOSIS — E538 Deficiency of other specified B group vitamins: Secondary | ICD-10-CM | POA: Diagnosis not present

## 2014-06-01 DIAGNOSIS — H539 Unspecified visual disturbance: Secondary | ICD-10-CM

## 2014-06-01 DIAGNOSIS — M26609 Unspecified temporomandibular joint disorder, unspecified side: Secondary | ICD-10-CM | POA: Diagnosis not present

## 2014-06-01 DIAGNOSIS — M4040 Postural lordosis, site unspecified: Secondary | ICD-10-CM | POA: Insufficient documentation

## 2014-06-01 DIAGNOSIS — R200 Anesthesia of skin: Secondary | ICD-10-CM

## 2014-06-01 DIAGNOSIS — M542 Cervicalgia: Secondary | ICD-10-CM | POA: Diagnosis not present

## 2014-06-01 DIAGNOSIS — M47812 Spondylosis without myelopathy or radiculopathy, cervical region: Secondary | ICD-10-CM | POA: Insufficient documentation

## 2014-06-01 DIAGNOSIS — R209 Unspecified disturbances of skin sensation: Secondary | ICD-10-CM | POA: Diagnosis not present

## 2014-06-01 LAB — CBC WITH DIFFERENTIAL/PLATELET
BASOS ABS: 0 10*3/uL (ref 0.0–0.1)
BASOS PCT: 0 % (ref 0–1)
Eosinophils Absolute: 0.1 10*3/uL (ref 0.0–0.7)
Eosinophils Relative: 1 % (ref 0–5)
HCT: 40 % (ref 36.0–46.0)
Hemoglobin: 13.5 g/dL (ref 12.0–15.0)
LYMPHS PCT: 27 % (ref 12–46)
Lymphs Abs: 1.8 10*3/uL (ref 0.7–4.0)
MCH: 31.6 pg (ref 26.0–34.0)
MCHC: 33.8 g/dL (ref 30.0–36.0)
MCV: 93.7 fL (ref 78.0–100.0)
Monocytes Absolute: 0.4 10*3/uL (ref 0.1–1.0)
Monocytes Relative: 6 % (ref 3–12)
NEUTROS PCT: 66 % (ref 43–77)
Neutro Abs: 4.5 10*3/uL (ref 1.7–7.7)
Platelets: 172 10*3/uL (ref 150–400)
RBC: 4.27 MIL/uL (ref 3.87–5.11)
RDW: 14.9 % (ref 11.5–15.5)
WBC: 6.8 10*3/uL (ref 4.0–10.5)

## 2014-06-01 LAB — SEDIMENTATION RATE: Sed Rate: 1 mm/hr (ref 0–22)

## 2014-06-01 MED ORDER — CYCLOBENZAPRINE HCL 10 MG PO TABS: 10.0000 mg | ORAL_TABLET | Freq: Every day | ORAL | Status: DC

## 2014-06-01 NOTE — Patient Instructions (Addendum)
If you get worsening vision, trouble with speech, worse HA ever, weakness go to ER.  If in the morning eye will not shut, or worsening right facial paralysis call the office.    What is the TMJ? The temporomandibular (tem-PUH-ro-man-DIB-yoo-ler) joint, or the TMJ, connects the upper and lower jawbones. This joint allows the jaw to open wide and move back and forth when you chew, talk, or yawn.There are also several muscles that help this joint move. There can be muscle tightness and pain in the muscle that can cause several symptoms.  What causes TMJ pain? There are many causes of TMJ pain. Repeated chewing (for example, chewing gum) and clenching your teeth can cause pain in the joint. Some TMJ pain has no obvious cause. What can I do to ease the pain? There are many things you can do to help your pain get better. When you have pain:  Eat soft foods and stay away from chewy foods (for example, taffy) Try to use both sides of your mouth to chew Don't chew gum Don't open your mouth wide (for example, during yawning or singing) Don't bite your cheeks or fingernails Lower your amount of stress and worry Applying a warm, damp washcloth to the joint may help. Over-the-counter pain medicines such as ibuprofen (one brand: Advil) or acetaminophen (one brand: Tylenol) might also help. Do not use these medicines if you are allergic to them or if your doctor told you not to use them. How can I stop the pain from coming back? When your pain is better, you can do these exercises to make your muscles stronger and to keep the pain from coming back:  Resisted mouth opening: Place your thumb or two fingers under your chin and open your mouth slowly, pushing up lightly on your chin with your thumb. Hold for three to six seconds. Close your mouth slowly. Resisted mouth closing: Place your thumbs under your chin and your two index fingers on the ridge between your mouth and the bottom of your chin. Push down  lightly on your chin as you close your mouth. Tongue up: Slowly open and close your mouth while keeping the tongue touching the roof of the mouth. Side-to-side jaw movement: Place an object about one fourth of an inch thick (for example, two tongue depressors) between your front teeth. Slowly move your jaw from side to side. Increase the thickness of the object as the exercise becomes easier Forward jaw movement: Place an object about one fourth of an inch thick between your front teeth and move the bottom jaw forward so that the bottom teeth are in front of the top teeth. Increase the thickness of the object as the exercise becomes easier. These exercises should not be painful. If it hurts to do these exercises, stop doing them and talk to your family doctor.   Bell's Palsy Bell's palsy is a condition in which the muscles on one side of the face cannot move (paralysis). This is because the nerves in the face are paralyzed. It is most often thought to be caused by a virus. The virus causes swelling of the nerve that controls movement on one side of the face. The nerve travels through a tight space surrounded by bone. When the nerve swells, it can be compressed by the bone. This results in damage to the protective covering around the nerve. This damage interferes with how the nerve communicates with the muscles of the face. As a result, it can cause weakness or  paralysis of the facial muscles.  Injury (trauma), tumor, and surgery may cause Bell's palsy, but most of the time the cause is unknown. It is a relatively common condition. It starts suddenly (abrupt onset) with the paralysis usually ending within 2 days. Bell's palsy is not dangerous. But because the eye does not close properly, you may need care to keep the eye from getting dry. This can include splinting (to keep the eye shut) or moistening with artificial tears. Bell's palsy very seldom occurs on both sides of the face at the same time. SYMPTOMS    Eyebrow sagging.  Drooping of the eyelid and corner of the mouth.  Inability to close one eye.  Loss of taste on the front of the tongue.  Sensitivity to loud noises. TREATMENT  The treatment is usually non-surgical. If the patient is seen within the first 24 to 48 hours, a short course of steroids may be prescribed, in an attempt to shorten the length of the condition. Antiviral medicines may also be used with the steroids, but it is unclear if they are helpful.  You will need to protect your eye, if you cannot close it. The cornea (clear covering over your eye) will become dry and can be damaged. Artificial tears can be used to keep your eye moist. Glasses or an eye patch should be worn to protect your eye. PROGNOSIS  Recovery is variable, ranging from days to months. Although the problem usually goes away completely (about 80% of cases resolve), predicting the outcome is impossible. Most people improve within 3 weeks of when the symptoms began. Improvement may continue for 3 to 6 months. A small number of people have moderate to severe weakness that is permanent.  HOME CARE INSTRUCTIONS   If your caregiver prescribed medication to reduce swelling in the nerve, use as directed. Do not stop taking the medication unless directed by your caregiver.  Use moisturizing eye drops as needed to prevent drying of your eye, as directed by your caregiver.  Protect your eye, as directed by your caregiver.  Use facial massage and exercises, as directed by your caregiver.  Perform your normal activities, and get your normal rest. SEEK IMMEDIATE MEDICAL CARE IF:   There is pain, redness or irritation in the eye.  You or your child has an oral temperature above 102 F (38.9 C), not controlled by medicine. MAKE SURE YOU:   Understand these instructions.  Will watch your condition.  Will get help right away if you are not doing well or get worse. Document Released: 11/05/2005 Document  Revised: 01/28/2012 Document Reviewed: 11/14/2009 [facility] Patient Information 2015 [place], [place]. This information is not intended to replace advice given to you by your health care provider. Make sure you discuss any questions you have with your health care provider.

## 2014-06-01 NOTE — Progress Notes (Signed)
   Subjective:    Patient ID: Debbie Houston, female    DOB: [birth year], 73 y.o.   MRN: [id number]  HPI 73 y.o. female presents with right sided neck pain, worse the past week. She has had right occipital pressure/numbness that radiates towards her temple/right eye. She states her right eye vision is a bit blurry from her left. Heat helps. Denies trouble with speech, HA, weakness.   Review of Systems  Constitutional: Negative.   HENT: Positive for ear pain. Negative for congestion, dental problem, drooling, ear discharge, facial swelling, hearing loss, mouth sores, nosebleeds, postnasal drip, rhinorrhea, sinus pressure, sneezing, sore throat, tinnitus, trouble swallowing and voice change.   Eyes: Positive for visual disturbance. Negative for photophobia, pain, discharge, redness and itching.  Respiratory: Negative.   Cardiovascular: Negative.   Gastrointestinal: Negative.   Genitourinary: Negative.   Musculoskeletal: Positive for neck pain. Negative for arthralgias, back pain, gait problem, joint swelling, myalgias and neck stiffness.  Neurological: Positive for numbness. Negative for dizziness, tremors, seizures, syncope, facial asymmetry, speech difficulty, weakness, light-headedness and headaches.       Objective:   Physical Exam  Constitutional: She is oriented to person, place, and time. She appears well-developed and well-nourished.  HENT:  Head: Normocephalic and atraumatic.  Eyes: Conjunctivae are normal. Pupils are equal, round, and reactive to light.  Neck: Normal range of motion. Neck supple.  + TMJ  Cardiovascular: Normal rate and regular rhythm.   Pulmonary/Chest: Effort normal and breath sounds normal.  Abdominal: Soft. Bowel sounds are normal.  Musculoskeletal: Normal range of motion. She exhibits tenderness (right SCM tenderness).  Lymphadenopathy:    She has no cervical adenopathy.  Neurological: She is alert and oriented to person, place, and time. She has normal  reflexes.  Cranial nerves: Questionable flattening of right nasal fold and less ptosis of right eye.  There is good sensation of the face to pinprick and soft touch bilaterally. The strength of the facial muscles and the muscles to head turning and shoulder shrug are normal bilaterally. Speech is well enunciated, no aphasia or dysarthria is noted. Extraocular movements are full. Visual fields are full. The tongue is midline, and the patient has symmetric elevation of the soft palate. No obvious hearing deficits are noted.  Motor: The motor testing reveals 5 over 5 strength of all 4 extremities. Good symmetric motor tone is noted throughout.  Sensory: Sensory testing is intact to pinprick, soft touch, vibration sensation, and position sense on all 4 extremities. No evidence of extinction is noted.  Coordination: Cerebellar testing reveals good finger-nose-finger and heel-to-shin bilaterally.  Gait and station: Gait is normal. Tandem gait is steady Romberg is positive. No drift is seen.  Reflexes: Deep tendon reflexes are symmetric and normal bilaterally. Toes are downgoing bilaterally.         Assessment & Plan:  ? Bell's Palsy versus TMJ, normal neuro exam other than right labial fold is flat- Get ESR rule out temporal arteritis. Get Cervical neck Xray, rule out DDD information given to the patient, no gum/decrease hard foods, warm wet wash clothes, decrease stress, talk with dentist about possible night guard, can do massage, and exercise.  If you get worsening vision, trouble with speech, worse HA ever, weakness go to ER.  If in the morning eye will not shut, or worsening right facial paralysis call the office.

## 2014-06-02 LAB — BASIC METABOLIC PANEL WITH GFR
BUN: 15 mg/dL (ref 6–23)
CHLORIDE: 104 meq/L (ref 96–112)
CO2: 26 mEq/L (ref 19–32)
Calcium: 9.5 mg/dL (ref 8.4–10.5)
Creat: 0.77 mg/dL (ref 0.50–1.10)
GFR, EST NON AFRICAN AMERICAN: 77 mL/min
GFR, Est African American: 89 mL/min
Glucose, Bld: 106 mg/dL — ABNORMAL HIGH (ref 70–99)
Potassium: 4.1 mEq/L (ref 3.5–5.3)
Sodium: 138 mEq/L (ref 135–145)

## 2014-06-02 LAB — HEPATIC FUNCTION PANEL
ALBUMIN: 4.4 g/dL (ref 3.5–5.2)
ALK PHOS: 38 U/L — AB (ref 39–117)
ALT: 19 U/L (ref 0–35)
AST: 22 U/L (ref 0–37)
BILIRUBIN INDIRECT: 0.3 mg/dL (ref 0.2–1.2)
BILIRUBIN TOTAL: 0.4 mg/dL (ref 0.2–1.2)
Bilirubin, Direct: 0.1 mg/dL (ref 0.0–0.3)
Total Protein: 6 g/dL (ref 6.0–8.3)

## 2014-06-02 LAB — VITAMIN B12: VITAMIN B 12: 1302 pg/mL — AB (ref 211–911)

## 2014-06-08 DIAGNOSIS — M79609 Pain in unspecified limb: Secondary | ICD-10-CM | POA: Diagnosis not present

## 2014-06-10 DIAGNOSIS — I8310 Varicose veins of unspecified lower extremity with inflammation: Secondary | ICD-10-CM | POA: Diagnosis not present

## 2014-06-13 DIAGNOSIS — M79609 Pain in unspecified limb: Secondary | ICD-10-CM | POA: Diagnosis not present

## 2014-07-12 ENCOUNTER — Ambulatory Visit (INDEPENDENT_AMBULATORY_CARE_PROVIDER_SITE_OTHER): Payer: Medicare Other | Admitting: *Deleted

## 2014-07-12 DIAGNOSIS — N390 Urinary tract infection, site not specified: Secondary | ICD-10-CM

## 2014-07-12 MED ORDER — CIPROFLOXACIN HCL 500 MG PO TABS: 500.0000 mg | ORAL_TABLET | Freq: Two times a day (BID) | ORAL | Status: AC

## 2014-07-12 NOTE — Progress Notes (Signed)
Patient ID: Debbie Houston, female   DOB: [birth year], 73 y.o.   MRN: [id number] Patient presents with c/o dysuria, burning and flank pain.  Per Vicie Mutters, PA-C orders, UA, C&S checked today and Cipro abx sent into pharmacy.  Advised patient abx may need to be changed after C&S results.

## 2014-07-13 LAB — URINALYSIS, ROUTINE W REFLEX MICROSCOPIC
Bilirubin Urine: NEGATIVE
Glucose, UA: NEGATIVE mg/dL
HGB URINE DIPSTICK: NEGATIVE
Ketones, ur: NEGATIVE mg/dL
LEUKOCYTES UA: NEGATIVE
NITRITE: NEGATIVE
PH: 7 (ref 5.0–8.0)
PROTEIN: NEGATIVE mg/dL
Specific Gravity, Urine: <1.005 — ABNORMAL LOW (ref 1.005–1.030)
Urobilinogen, UA: 0.2 mg/dL (ref 0.0–1.0)

## 2014-07-14 ENCOUNTER — Telehealth: Payer: Self-pay | Admitting: *Deleted

## 2014-07-14 LAB — URINE CULTURE
COLONY COUNT: NO GROWTH
Organism ID, Bacteria: NO GROWTH

## 2014-07-14 NOTE — Telephone Encounter (Signed)
Pt is calling says she seen on my chart that no infection ? Does she continue the cipro

## 2014-07-23 ENCOUNTER — Ambulatory Visit (INDEPENDENT_AMBULATORY_CARE_PROVIDER_SITE_OTHER): Payer: Medicare Other | Admitting: Physician Assistant

## 2014-07-23 ENCOUNTER — Encounter: Payer: Self-pay | Admitting: Physician Assistant

## 2014-07-23 VITALS — BP 110/70 | HR 88 | Temp 98.1°F | Resp 16 | Ht 65.5 in | Wt 111.0 lb

## 2014-07-23 DIAGNOSIS — I10 Essential (primary) hypertension: Secondary | ICD-10-CM | POA: Diagnosis not present

## 2014-07-23 DIAGNOSIS — R109 Unspecified abdominal pain: Secondary | ICD-10-CM

## 2014-07-23 DIAGNOSIS — E559 Vitamin D deficiency, unspecified: Secondary | ICD-10-CM

## 2014-07-23 DIAGNOSIS — E039 Hypothyroidism, unspecified: Secondary | ICD-10-CM | POA: Diagnosis not present

## 2014-07-23 DIAGNOSIS — Z23 Encounter for immunization: Secondary | ICD-10-CM

## 2014-07-23 DIAGNOSIS — E785 Hyperlipidemia, unspecified: Secondary | ICD-10-CM

## 2014-07-23 DIAGNOSIS — Z79899 Other long term (current) drug therapy: Secondary | ICD-10-CM | POA: Diagnosis not present

## 2014-07-23 DIAGNOSIS — R7309 Other abnormal glucose: Secondary | ICD-10-CM | POA: Diagnosis not present

## 2014-07-23 DIAGNOSIS — M949 Disorder of cartilage, unspecified: Secondary | ICD-10-CM

## 2014-07-23 DIAGNOSIS — R197 Diarrhea, unspecified: Secondary | ICD-10-CM

## 2014-07-23 DIAGNOSIS — D649 Anemia, unspecified: Secondary | ICD-10-CM | POA: Diagnosis not present

## 2014-07-23 DIAGNOSIS — R1030 Lower abdominal pain, unspecified: Secondary | ICD-10-CM

## 2014-07-23 DIAGNOSIS — M8580 Other specified disorders of bone density and structure, unspecified site: Secondary | ICD-10-CM

## 2014-07-23 DIAGNOSIS — M899 Disorder of bone, unspecified: Secondary | ICD-10-CM

## 2014-07-23 DIAGNOSIS — N3941 Urge incontinence: Secondary | ICD-10-CM

## 2014-07-23 LAB — CBC WITH DIFFERENTIAL/PLATELET
BASOS PCT: 1 % (ref 0–1)
Basophils Absolute: 0.1 10*3/uL (ref 0.0–0.1)
Eosinophils Absolute: 0.1 10*3/uL (ref 0.0–0.7)
Eosinophils Relative: 2 % (ref 0–5)
HCT: 40.3 % (ref 36.0–46.0)
HEMOGLOBIN: 14 g/dL (ref 12.0–15.0)
Lymphocytes Relative: 23 % (ref 12–46)
Lymphs Abs: 1.5 10*3/uL (ref 0.7–4.0)
MCH: 32 pg (ref 26.0–34.0)
MCHC: 34.7 g/dL (ref 30.0–36.0)
MCV: 92.2 fL (ref 78.0–100.0)
MONO ABS: 0.5 10*3/uL (ref 0.1–1.0)
MONOS PCT: 8 % (ref 3–12)
Neutro Abs: 4.2 10*3/uL (ref 1.7–7.7)
Neutrophils Relative %: 66 % (ref 43–77)
Platelets: 177 10*3/uL (ref 150–400)
RBC: 4.37 MIL/uL (ref 3.87–5.11)
RDW: 14.9 % (ref 11.5–15.5)
WBC: 6.4 10*3/uL (ref 4.0–10.5)

## 2014-07-23 LAB — BASIC METABOLIC PANEL WITH GFR
BUN: 13 mg/dL (ref 6–23)
CO2: 26 mEq/L (ref 19–32)
Calcium: 9.6 mg/dL (ref 8.4–10.5)
Chloride: 107 mEq/L (ref 96–112)
Creat: 0.82 mg/dL (ref 0.50–1.10)
GFR, EST AFRICAN AMERICAN: 83 mL/min
GFR, Est Non African American: 72 mL/min
GLUCOSE: 83 mg/dL (ref 70–99)
Potassium: 4.5 mEq/L (ref 3.5–5.3)
SODIUM: 140 meq/L (ref 135–145)

## 2014-07-23 LAB — HEMOGLOBIN A1C
Hgb A1c MFr Bld: 5.9 % — ABNORMAL HIGH (ref <5.7)
MEAN PLASMA GLUCOSE: 123 mg/dL — AB (ref <117)

## 2014-07-23 LAB — HEPATIC FUNCTION PANEL
ALT: 16 U/L (ref 0–35)
AST: 20 U/L (ref 0–37)
Albumin: 4.3 g/dL (ref 3.5–5.2)
Alkaline Phosphatase: 37 U/L — ABNORMAL LOW (ref 39–117)
Bilirubin, Direct: 0.1 mg/dL (ref 0.0–0.3)
Indirect Bilirubin: 0.3 mg/dL (ref 0.2–1.2)
TOTAL PROTEIN: 6.2 g/dL (ref 6.0–8.3)
Total Bilirubin: 0.4 mg/dL (ref 0.2–1.2)

## 2014-07-23 LAB — LIPID PANEL
CHOLESTEROL: 171 mg/dL (ref 0–200)
HDL: 71 mg/dL (ref >39)
LDL Cholesterol: 86 mg/dL (ref 0–99)
TRIGLYCERIDES: 71 mg/dL (ref <150)
Total CHOL/HDL Ratio: 2.4 Ratio
VLDL: 14 mg/dL (ref 0–40)

## 2014-07-23 LAB — TSH: TSH: 2.662 u[IU]/mL (ref 0.350–4.500)

## 2014-07-23 LAB — MAGNESIUM: MAGNESIUM: 1.9 mg/dL (ref 1.5–2.5)

## 2014-07-23 MED ORDER — ESTROGENS, CONJUGATED 0.625 MG/GM VA CREA: 1.0000 | TOPICAL_CREAM | Freq: Every day | VAGINAL | Status: DC

## 2014-07-23 NOTE — Patient Instructions (Signed)
VAGINAL DRYNESS OVERVIEW  Vaginal dryness, also known as atrophic vaginitis, is a common condition in postmenopausal women. This condition is also common in women who have had both ovaries removed at the time of hysterectomy.   Some women have uncomfortable symptoms of vaginal dryness, such as pain with sex, burning vaginal discomfort or itching, or abnormal vaginal discharge, while others have no symptoms at all.  VAGINAL DRYNESS CAUSES   Estrogen helps to keep the vagina moist and to maintain thickness of the vaginal lining. Vaginal dryness occurs when the ovaries produce a decreased amount of estrogen. This can occur at certain times in a woman's life, and may be permanent or temporary. Times when less estrogen is made include: ?At the time of menopause. ?After surgical removal of the ovaries, chemotherapy, or radiation therapy of the pelvis for cancer. ?After having a baby, particularly in women who breastfeed. ?While using certain medications, such as danazol, medroxyprogesterone (brand names: Provera or DepoProvera), leuprolide (brand name: Lupron), or nafarelin. When these medications are stopped, estrogen production resumes.  Women who smoke cigarettes have been shown to have an increased risk of an earlier menopause transition as compared to non-smokers. Therefore, atrophic vaginitis symptoms may appear at a younger age in this population.  VAGINAL DRYNESS TREATMENT   There are three treatment options for women with vaginal dryness:  Vaginal lubricants and moisturizers - Vaginal lubricants and moisturizers can be purchased without a prescription. These products do not contain any hormones and have virtually no side effects. - Albolene is found in the facial cleanser section at [facility], [facility], or [employer]. It is a large jar with a blue top. This is the best lubricant for women because it is hypoallergenic. -Natural lubricants, such as olive, avocado or peanut oil, are easily available  products that may be used as a lubricant with sex.  -Vaginal moisturizes (eg, Replens, Moist Again, Vagisil, K-Y Silk-E, and Feminease) are formulated to allow water to be retained in the vaginal tissues. Moisturizers are applied into the vagina three times weekly to allow a continued moisturizing effect. These should not be used just before having sex, as they can be irritating.  Vaginal estrogen - Vaginal estrogen is the most effective treatment option for women with vaginal dryness. Vaginal estrogen must be prescribed by a healthcare provider. Very low doses of vaginal estrogen can be used when it is put into the vagina to treat vaginal dryness. A small amount of estrogen is absorbed into the bloodstream, but only about 100 times less than when using estrogen pills or tablets. As a result, there is a much lower risk of side effects, such as blood clots, breast cancer, and heart attack, compared with other estrogen-containing products (birth control pills, menopausal hormone therapy).   Ospemifene - Ospemifene is a prescription medication that is similar to estrogen, but is not estrogen. In the vaginal tissue, it acts similarly to estrogen. In the breast tissue, it acts as an estrogen blocker. It comes in a pill, and is prescribed for women who want to use an estrogen-like medication for vaginal dryness or painful sex associated with vaginal dryness, but prefer not to use a vaginal medication. The medication may cause hot flashes as a side effect. This type of medication may increase the risk of blood clots or uterine cancer. Further study of ospemifene is needed to evaluate the risk of these complications. This medication has not been tested in women who have had breast cancer or are at a high risk of developing  breast cancer.    Sexual activity - Vaginal estrogen improves vaginal dryness quickly, usually within a few weeks. You may continue to have sex as you treat vaginal dryness because sex itself  can help to keep the vaginal tissues healthy. Vaginal intercourse may help the vaginal tissues by keeping them soft and stretchable and preventing the tissues from shrinking.  If sex continues to be painful despite treatment for vaginal dryness, talk to your healthcare provider.    Overactive Bladder The bladder has two functions that are totally opposite of the other. One is to relax and stretch out so it can store urine (fills like a balloon), and the other is to contract and squeeze down so that it can empty the urine that it has stored. Proper functioning of the bladder is a complex mixing of these two functions. The filling and emptying of the bladder can be influenced by:  The bladder.  The spinal cord.  The brain.  The nerves going to the bladder.  Other organs that are closely related to the bladder such as prostate in males and the vagina in females. As your bladder fills with urine, nerve signals are sent from the bladder to the brain to tell you that you may need to urinate. Normal urination requires that the bladder squeeze down with sufficient strength to empty the bladder, but this also requires that the bladder squeeze down sufficiently long to finish the job. In addition the sphincter muscles, which normally keep you from leaking urine, must also relax so that the urine can pass. Coordination between the bladder muscle squeezing down and the sphincter muscles relaxing is required to make everything happen normally. With an overactive bladder sometimes the muscles of the bladder contract unexpectedly and involuntarily and this causes an urgent need to urinate. The normal response is to try to hold urine in by contracting the sphincter muscles. Sometimes the bladder contracts so strongly that the sphincter muscles cannot stop the urine from passing out and incontinence occurs. This kind of incontinence is called urge incontinence. Having an overactive bladder can be embarrassing and  awkward. It can keep you from living life the way you want to. Many people think it is just something you have to put up with as you grow older or have certain health conditions. In fact, there are treatments that can help make your life easier and more pleasant. CAUSES  Many things can cause an overactive bladder. Possibilities include:  Urinary tract infection or infection of nearby tissues such as the prostate.  Prostate enlargement.  In women, multiple pregnancies or surgery on the uterus or urethra.  Bladder stones, inflammation, or tumors.  Caffeine.  Alcohol.  Medications. For example, diuretics (drugs that help the body get rid of extra fluid) increase urine production. Some other medicines must be taken with lots of fluids.  Muscle or nerve weakness. This might be the result of a spinal cord injury, a stroke, multiple sclerosis, or Parkinson disease.  Diabetes can cause a high urine volume which fills the bladder so quickly that the normal urge to urinate is triggered very strongly. SYMPTOMS   Loss of bladder control. You feel the need to urinate and cannot make your body wait.  Sudden, strong urges to urinate.  Urinating 8 or more times a day.  Waking up to urinate two or more times a night. DIAGNOSIS  To decide if you have overactive bladder, your health care provider will probably:  Ask about symptoms you have noticed.  Ask about your overall health. This will include questions about any medications you are taking.  Do a physical examination. This will help determine if there are obvious blockages or other problems.  Order some tests. These might include:  A blood test to check for diabetes or other health issues that could be contributing to the problem.  Urine testing. This could measure the flow of urine and the pressure on the bladder.  A test of your neurological system (the brain, spinal cord, and nerves). This is the system that senses the need to  urinate. Some of these tests are called flow tests, bladder pressure tests, and electrical measurements of the sphincter muscle.  A bladder test to check whether it is emptying completely when you urinate.  Cystoscopy. This test uses a thin tube with a tiny camera on it. It offers a look inside your urethra and bladder to see if there are problems.  Imaging tests. You might be given a contrast dye and then asked to urinate. X-rays are taken to see how your bladder is working. TREATMENT  An overactive bladder can be treated in many ways. The treatment will depend on the cause. Whether you have a mild or severe case also makes a difference. Often, treatment can be given in your health care provider's office or clinic. Be sure to discuss the different options with your caregiver. They include:  Behavioral treatments. These do not involve medication or surgery:  Bladder training. For this, you would follow a schedule to urinate at regular intervals. This helps you learn to control the urge to urinate. At first, you might be asked to wait a few minutes after feeling the urge. In time, you should be able to schedule bathroom visits an hour or more apart.  Kegel exercises. These exercises strengthen the pelvic floor muscles, which support the bladder. Toning these muscles can help control urination even if the bladder muscles are overactive. A specialist will teach you how to do these exercises correctly. They will require daily practice.  Weight loss. If you are obese or overweight, losing weight might stop your bladder from being overactive. Talk to your health care provider about how many pounds you should lose. Also ask if there is a specific program or method that would work best for you.  Diet change. This might be suggested if constipation is making your overactive bladder worse. Your health care provider or a nutritionist can explain ways to change what you eat to ease constipation. Other people  might need to take in less caffeine or alcohol. Sometimes drinking fewer fluids is needed, too.  Protection. This is not an actual treatment. But, you could wear special pads to take care of any leakage while you wait for other treatments to take effect. This will help you avoid embarrassment.  Physical treatments.  Electrical stimulation. Electrodes will send gentle pulses to the nerves or muscles that help control the bladder. The goal is to strengthen them. Sometimes this is done with the electrodes outside the body. Or, they might be placed inside the body (implanted). This treatment can take several months to have an effect.  Medications. These are usually used along with other treatments. Several medicines are available. Some are injected into the muscles involved in urination. Others come in pill form. Medications sometimes prescribed include:  Anticholinergics. These drugs block the signals that the nerves deliver to the bladder. This keeps it from releasing urine at the wrong time. Researchers think the drugs might help  in other ways, too.  Imipramine. This is an antidepressant. But, it relaxes bladder muscles.  Botox. This is still experimental. Some people believe that injecting it into the bladder muscles will relax them so they work more normally. It has also been injected into the sphincter muscle when the sphincter muscle does not open properly. This is a temporary fix, however. Also, it might make matters worse, especially in older people.  Surgery.  A device might be implanted to help manage your nerves. It works on the nerves that signal when you need to urinate.  Surgery is sometimes needed with electrical stimulation. If the electrodes are implanted, this is done through surgery.  Sometimes repairs need to be made through surgery. For example, the size of the bladder can be changed. This is usually done in severe cases only. HOME CARE INSTRUCTIONS   Take any medications  your health care provider prescribed or suggested. Follow the directions carefully.  Practice any lifestyle changes that are recommended. These might include:  Drinking less fluid or drinking at different times of the day. If you need to urinate often during the night, for example, you may need to stop drinking fluids early in the evening.  Cutting down on caffeine or alcohol. They can both make an overactive bladder worse. Caffeine is found in coffee, tea, and sodas.  Doing Kegel exercises to strengthen muscles.  Losing weight, if that is recommended.  Eating a healthy and balanced diet. This will help you avoid constipation.  Keep a journal or a log. You might be asked to record how much you drink and when, and also when you feel the need to urinate.  Learn how to care for implants or other devices, such as pessaries. SEEK MEDICAL CARE IF:   Your overactive bladder gets worse.  You feel increased pain or irritation when you urinate.  You notice blood in your urine.  You have questions about any medications or devices that your health care provider recommended.  You notice blood, pus, or swelling at the site of any test or treatment procedure.  You have an oral temperature above 102F (38.9C). SEEK IMMEDIATE MEDICAL CARE IF:  You have an oral temperature above 102F (38.9C), not controlled by medicine. Document Released: 09/01/2009 Document Revised: 03/22/2014 Document Reviewed: 09/01/2009 [facility] Patient Information 2015 [place], [place]. This information is not intended to replace advice given to you by your health care provider. Make sure you discuss any questions you have with your health care provider.

## 2014-07-23 NOTE — Progress Notes (Signed)
Assessment and Plan:  1. Hypertension - CBC with Differential - BASIC METABOLIC PANEL WITH GFR - Hepatic function panel  2. Hypothyroidism, unspecified hypothyroidism type Hypothyroidism-check TSH level, continue medications the same.  - TSH  3. Vitamin D deficiency  4. PreDiabetes Discussed general issues about diabetes pathophysiology and management., Educational material distributed., Suggested low cholesterol diet., Encouraged aerobic exercise., Discussed foot care., Reminded to get yearly retinal exam. - Hemoglobin A1c  5. Hyperlipidemia -continue medications, check lipids, decrease fatty foods, increase activity.  - Lipid panel  6. Anemia, unspecified anemia type - CBC with Differential  7. Osteopenia Continue tamoxifen, repeat DEXA  8. Urge incontinence No cystocele, normal urine, + vaginal atrophy, will treat with vaginal estrogen, discussed OAB meds but patient declines  9. Suprapubic pain, unspecified laterality Rule out ovarian mass - US Transvaginal Non-OB; Future - US Pelvis Complete; Future  10. Diarrhea Son with UC, will check ESR - Sed Rate (ESR)  11. Encounter for long-term (current) use of other medications - Magnesium   Continue diet and meds as discussed. Further disposition pending results of labs. OVER 40 minutes of exam, counseling, chart review, referral performed   HPI 73 y.o. female  presents for 3 month follow up with hypertension, hyperlipidemia, prediabetes and vitamin D. Her blood pressure has been controlled at home, today their BP is BP: 110/70 mmHg She does workout. She denies chest pain, shortness of breath, dizziness.  She is on cholesterol medication and denies myalgias. Her cholesterol is at goal. The cholesterol last visit was:   Lab Results  Component Value Date   CHOL 157 04/20/2014   HDL 55 04/20/2014   LDLCALC 86 04/20/2014   TRIG 80 04/20/2014   CHOLHDL 2.9 04/20/2014   She has been working on diet and exercise for  prediabetes, and denies paresthesia of the feet, polydipsia, polyuria and visual disturbances. She follows with Dr. Sabra Heck, Last A1C in the office was:  Lab Results  Component Value Date   HGBA1C 5.9* 04/20/2014   Patient is on Vitamin D supplement.   Lab Results  Component Value Date   VD25OH 52 04/20/2014     She is on thyroid medication. Her medication was not changed last visit. Patient denies nervousness, palpitations and weight changes.  Lab Results  Component Value Date   TSH 2.809 04/20/2014  .  She has osteopenia and is on tamoxifen instead of evista due to cost. Due for DEXA this year.  She thought she had a UTI but her urine came back normal. She has had nocturia x 2 with small amounts, occasional urge incontinence, has started to wearing a bad. She has had some suprapubic pelvic pain. History of vaginal [place] in 2012, last PAP 2012.   Current Medications:  Current Outpatient Prescriptions on File Prior to Visit  Medication Sig Dispense Refill  . aspirin 81 MG tablet Take 81 mg by mouth daily.      . Biotin 1 MG CAPS Take 1 mg by mouth daily.      . budesonide (ENTOCORT EC) 3 MG 24 hr capsule Take 3 mg by mouth daily.      . Calcium Carbonate (CALCIUM 600 PO) Take by mouth daily.      . cetirizine (ZYRTEC) 10 MG tablet Take 10 mg by mouth daily.      . cholecalciferol (VITAMIN D) 1000 UNITS tablet Take 2,000 Units by mouth daily.      . cyclobenzaprine (FLEXERIL) 10 MG tablet Take 1 tablet (10 mg total)  by mouth at bedtime.  30 tablet  1  . fish oil-omega-3 fatty acids 1000 MG capsule Take 2 g by mouth daily.      . Flaxseed, Linseed, (FLAX SEED OIL) 1000 MG CAPS Take 1,000 mg by mouth daily.      Marland Kitchen levothyroxine (SYNTHROID, LEVOTHROID) 125 MCG tablet TAKE ONE TABLET BY MOUTH DAILY  90 tablet  1  . Multiple Vitamins-Minerals (MULTIVITAMIN WITH MINERALS) tablet Take 1 tablet by mouth daily.      . Probiotic Product (PROBIOTIC DAILY PO) Take by mouth daily.      . simvastatin (ZOCOR)  20 MG tablet TAKE 1 TABLET BY MOUTH ONCE DAILY  30 tablet  3  . tamoxifen (NOLVADEX) 20 MG tablet TAKE 1 TABLET BY MOUTH EVERY DAY FOR BONES  90 tablet  0  . triamcinolone (NASACORT) 55 MCG/ACT AERO nasal inhaler Place 2 sprays into the nose at bedtime as needed.      . vitamin C (ASCORBIC ACID) 500 MG tablet Take 500 mg by mouth daily.       No current facility-administered medications on file prior to visit.   Medical History:  Past Medical History  Diagnosis Date  . Hyperlipidemia   . Hypothyroid   . GERD (gastroesophageal reflux disease)   . Asthma   . Arthritis   . DJD (degenerative joint disease)   . Osteopenia   . Vitamin D deficiency   . Allergy   . Colitis   . Anemia   . Benign labile hypertension    Allergies:  Allergies  Allergen Reactions  . Fosamax [Alendronate Sodium]     Gi uPSET  . Ppd [Tuberculin Purified Protein Derivative]     POSITIVE TITER HX  . Singulair [Montelukast Sodium]     JITTERS  . Wellbutrin [Bupropion] Hives  . Sulfa Antibiotics Rash     Review of Systems: [X]  = complains of  [ ]  = denies  General: Fatigue [ ]  Fever [ ]  Chills [ ]  Weakness [ ]   Insomnia [ ]  Eyes: Redness [ ]  Blurred vision [ ]  Diplopia [ ]   ENT: Congestion [ ]  Sinus Pain [ ]  Post Nasal Drip [ ]  Sore Throat [ ]  Earache [ ]   Cardiac: Chest pain/pressure [ ]  SOB [ ]  Orthopnea [ ]   Palpitations [ ]   Paroxysmal nocturnal dyspnea[ ]  Claudication [ ]  Edema [ ]   Pulmonary: Cough [ ]  Wheezing[ ]   SOB [ ]   Snoring [ ]   GI: Nausea [ ]  Vomiting[ ]  Dysphagia[ ]  Heartburn[ ]  Abdominal pain [ ]  Constipation [ ] ; Diarrhea [ ] ; BRBPR [ ]  Melena[ ]  GU: Hematuria[ ]  Dysuria [ ]  Nocturia[ ]  Urgency [x ]  Hesitancy [ ]  Discharge [ ]  Neuro: Headaches[ ]  Vertigo[ ]  Paresthesias[ ]  Spasm [ ]  Speech changes [ ]  Incoordination [ ]   Ortho: Arthritis [ ]  Joint pain [ ]  Muscle pain [ ]  Joint swelling [ ]  Back Pain [ ]  Skin:  Rash [ ]   Pruritis [ ]  Change in skin lesion [ ]   Psych: Depression[ ]   Anxiety[ ]  Confusion [ ]  Memory loss [ ]   Heme/Lypmh: Bleeding [ ]  Bruising [ ]  Enlarged lymph nodes [ ]   Endocrine: Visual blurring [ ]  Paresthesia [ ]  Polyuria [ ]  Polydypsea [ ]    Heat/cold intolerance [ ]  Hypoglycemia [ ]   Family history- Review and unchanged Social history- Review and unchanged Physical Exam: BP 110/70  Pulse 88  Temp(Src) 98.1 F (36.7 C)  Resp 16  Ht 5' 5.5" (1.664 m)  Wt 111 lb (50.349 kg)  BMI 18.18 kg/m2 Wt Readings from Last 3 Encounters:  07/23/14 111 lb (50.349 kg)  06/01/14 109 lb (49.442 kg)  04/20/14 111 lb 9.6 oz (50.621 kg)   General Appearance: Well nourished, in no apparent distress. Eyes: PERRLA, EOMs, conjunctiva no swelling or erythema Sinuses: No Frontal/maxillary tenderness ENT/Mouth: Ext aud canals clear, TMs without erythema, bulging. No erythema, swelling, or exudate on post pharynx.  Tonsils not swollen or erythematous. Hearing normal.  Neck: Supple, thyroid normal.  Respiratory: Respiratory effort normal, BS equal bilaterally without rales, rhonchi, wheezing or stridor.  Cardio: RRR with no MRGs. Brisk peripheral pulses without edema.  Abdomen: Soft, + BS.  Tenderness right suprapubic, no guarding, rebound, hernias Lymphatics: Non tender without lymphadenopathy.  Musculoskeletal: Full ROM, 5/5 strength, normal gait.  Skin: Warm, dry without rashes, lesions, ecchymosis.  Neuro: Cranial nerves intact. Normal muscle tone, no cerebellar symptoms. Sensation intact.  Psych: Awake and oriented X 3, normal affect, Insight and Judgment appropriate.  GYN: VULVA: vulvar erythema, vulvar hypopigmentation/atrophy, VAGINA: atrophic, PELVIC FLOOR EXAM: no cystocele, rectocele or prolapse noted, vaginal erythema without discharge/lesions,   Vicie Mutters 8:55 AM

## 2014-07-24 LAB — SEDIMENTATION RATE: Sed Rate: 1 mm/hr (ref 0–22)

## 2014-07-27 NOTE — Progress Notes (Signed)
Faxed orders to [facility]. [facility] will contact patient to schedule appointment

## 2014-07-29 ENCOUNTER — Other Ambulatory Visit: Payer: Medicare Other

## 2014-08-04 ENCOUNTER — Other Ambulatory Visit: Payer: Self-pay | Admitting: Physician Assistant

## 2014-08-05 ENCOUNTER — Telehealth: Payer: Self-pay | Admitting: *Deleted

## 2014-08-05 ENCOUNTER — Ambulatory Visit
Admission: RE | Admit: 2014-08-05 | Discharge: 2014-08-05 | Disposition: A | Discharge status: Home / Self Care | Payer: Medicare Other | Source: Ambulatory Visit | Attending: Physician Assistant | Admitting: Physician Assistant

## 2014-08-05 DIAGNOSIS — D251 Intramural leiomyoma of uterus: Secondary | ICD-10-CM | POA: Diagnosis not present

## 2014-08-05 DIAGNOSIS — R1030 Lower abdominal pain, unspecified: Secondary | ICD-10-CM

## 2014-08-05 DIAGNOSIS — N83209 Unspecified ovarian cyst, unspecified side: Secondary | ICD-10-CM | POA: Diagnosis not present

## 2014-08-05 DIAGNOSIS — R9389 Abnormal findings on diagnostic imaging of other specified body structures: Secondary | ICD-10-CM

## 2014-08-05 NOTE — Telephone Encounter (Signed)
Dr. Melford Aase received a call report on pelvic u/s showing mild endometrial thickening with internal cystic changes.  May suggest lesion such as a polyp, fibroid or hyperplasia/neoplasia.  Findings are abnormal for post menopausal patients.  Per Dr. Idell Pickles orders referral to Dr. Helane Rima will be sent for further evaluation.  Patient aware.

## 2014-08-08 ENCOUNTER — Encounter: Payer: Self-pay | Admitting: Physician Assistant

## 2014-08-19 DIAGNOSIS — S39012A Strain of muscle, fascia and tendon of lower back, initial encounter: Secondary | ICD-10-CM | POA: Diagnosis not present

## 2014-08-19 DIAGNOSIS — M5417 Radiculopathy, lumbosacral region: Secondary | ICD-10-CM | POA: Diagnosis not present

## 2014-08-20 ENCOUNTER — Other Ambulatory Visit: Payer: Self-pay | Admitting: Physician Assistant

## 2014-08-20 ENCOUNTER — Encounter: Payer: Self-pay | Admitting: Physician Assistant

## 2014-08-20 MED ORDER — PREDNISONE 20 MG PO TABS: ORAL_TABLET | ORAL | Status: DC

## 2014-08-31 DIAGNOSIS — M1712 Unilateral primary osteoarthritis, left knee: Secondary | ICD-10-CM | POA: Diagnosis not present

## 2014-09-02 DIAGNOSIS — N8500 Endometrial hyperplasia, unspecified: Secondary | ICD-10-CM | POA: Diagnosis not present

## 2014-09-02 DIAGNOSIS — R1030 Lower abdominal pain, unspecified: Secondary | ICD-10-CM | POA: Diagnosis not present

## 2014-09-09 DIAGNOSIS — I8311 Varicose veins of right lower extremity with inflammation: Secondary | ICD-10-CM | POA: Diagnosis not present

## 2014-09-16 DIAGNOSIS — N840 Polyp of corpus uteri: Secondary | ICD-10-CM | POA: Diagnosis not present

## 2014-09-16 DIAGNOSIS — Z79810 Long term (current) use of selective estrogen receptor modulators (SERMs): Secondary | ICD-10-CM | POA: Diagnosis not present

## 2014-09-23 ENCOUNTER — Other Ambulatory Visit: Payer: Self-pay

## 2014-09-23 MED ORDER — LEVOTHYROXINE SODIUM 125 MCG PO TABS: 125.0000 ug | ORAL_TABLET | Freq: Every day | ORAL | Status: DC

## 2014-09-27 ENCOUNTER — Other Ambulatory Visit: Payer: Self-pay | Admitting: Gastroenterology

## 2014-09-27 DIAGNOSIS — K5289 Other specified noninfective gastroenteritis and colitis: Secondary | ICD-10-CM | POA: Diagnosis not present

## 2014-09-27 DIAGNOSIS — K2270 Barrett's esophagus without dysplasia: Secondary | ICD-10-CM | POA: Diagnosis not present

## 2014-09-27 DIAGNOSIS — K5730 Diverticulosis of large intestine without perforation or abscess without bleeding: Secondary | ICD-10-CM | POA: Diagnosis not present

## 2014-09-27 DIAGNOSIS — K449 Diaphragmatic hernia without obstruction or gangrene: Secondary | ICD-10-CM | POA: Diagnosis not present

## 2014-09-27 DIAGNOSIS — K2950 Unspecified chronic gastritis without bleeding: Secondary | ICD-10-CM | POA: Diagnosis not present

## 2014-09-27 DIAGNOSIS — Z09 Encounter for follow-up examination after completed treatment for conditions other than malignant neoplasm: Secondary | ICD-10-CM | POA: Diagnosis not present

## 2014-09-27 DIAGNOSIS — Z86010 Personal history of colonic polyps: Secondary | ICD-10-CM | POA: Diagnosis not present

## 2014-10-16 ENCOUNTER — Encounter: Payer: Self-pay | Admitting: *Deleted

## 2014-10-20 NOTE — H&P (Signed)
NAMEDOLORIS, SERVANTES NO.:  [id number]  MEDICAL RECORD NO.:  [id number]  LOCATION:                                 FACILITY:  PHYSICIAN:  Darlyn Chamber, M.D.   DATE OF BIRTH:  Oct 29, 1941  DATE OF ADMISSION: DATE OF DISCHARGE:                             HISTORY & PHYSICAL   DATE OF SURGERY:  December 9th, this is going to be done at [facility] in [place]:  The patient is a 73 year old postmenopausal patient who had an ultrasound performed by her family doctor revealing a cystic area within the endometrium.  She was doing this because of some vague abdominal discomfort.  Subsequent saline infusion ultrasound did reveal an endometrial polypoid mass.  She now presents for hysteroscopy with D and C.  ALLERGIES:  She is allergic to SULFA.  MEDICATIONS:  She is on: 1. Levothyroxine 125 mcg daily. 2. Simvastatin 20 mg daily. 3. Budesonide 3 g 1 per day.  PAST MEDICAL HISTORY:  Does have a history of arthritis as well as thyroid disease, otherwise usual childhood disease without any significant sequelae.  SURGICAL HISTORY:  She had one D and C for miscarriage and two vaginal deliveries.  SOCIAL HISTORY:  Reveals no tobacco or alcohol use.  FAMILY HISTORY:  Noncontributory.  PHYSICAL EXAMINATION:  VITAL SIGNS:  The patient is afebrile, stable vital signs. HEENT:  The patient is normocephalic.  Pupils are equal, round, reactive to light and accommodation.  Extraocular movements are intact.  Sclerae and conjunctivae are clear.  Oropharynx clear. NECK:  Without thyromegaly. BREASTS:  Not examined. LUNGS:  Clear. CARDIOVASCULAR SYSTEM:  Regular rate, without murmurs or gallops.  No carotid or abdominal bruits. ABDOMEN:  Abdominal exam is benign.  No mass, organomegaly, or tenderness. PELVIS:  On pelvic, normal external genitalia.  Vaginal mucosa is clear. Cervix unremarkable.  Uterus normal size,  shape, and contour.  Adnexa free of mass or tenderness. EXTREMITIES:  Trace edema. NEUROLOGIC:  Grossly normal limits.  IMPRESSION:  Endometrial polyp in a postmenopausal patient.  PLAN:  The patient to undergo a hysteroscopy and D and C.  The risks of the procedure have been discussed including with the risk of infection. Risk of hemorrhage that could require transfusion with the risk of AIDS or hepatitis.  Excessive bleeding could require hysterectomy.  Risk of perforation with injury to adjacent organs such as bladder, bowel, or ureters that could require further exploratory surgery.  Risk of deep venous thrombosis and pulmonary embolus.  The patient does understand potential risks, complications, and indications.     Darlyn Chamber, M.D.     JSM/MEDQ  D:  10/20/2014  T:  10/20/2014  Job:  [id number]

## 2014-10-20 NOTE — H&P (Signed)
  Patient name  Debbie Houston, Debbie Houston DICTATION#  [id number] CSN# [id number]  Darlyn Chamber, MD 10/20/2014 4:14 PM

## 2014-10-22 ENCOUNTER — Encounter (HOSPITAL_BASED_OUTPATIENT_CLINIC_OR_DEPARTMENT_OTHER): Payer: Self-pay | Admitting: *Deleted

## 2014-10-22 NOTE — Progress Notes (Signed)
NPO AFTER MN.  ARRIVE AT 0700. LAB WORK TO BE DONE Monday 10-25-2014. CURRENT EKG IN CHART AND EPIC. WILL TAKE SYNTHROID AND ENDOCORT AM DOS W/ SIPS OF WATER.

## 2014-10-25 DIAGNOSIS — N840 Polyp of corpus uteri: Secondary | ICD-10-CM | POA: Diagnosis not present

## 2014-10-25 DIAGNOSIS — K219 Gastro-esophageal reflux disease without esophagitis: Secondary | ICD-10-CM | POA: Diagnosis not present

## 2014-10-25 DIAGNOSIS — Z882 Allergy status to sulfonamides status: Secondary | ICD-10-CM | POA: Diagnosis not present

## 2014-10-25 DIAGNOSIS — F17210 Nicotine dependence, cigarettes, uncomplicated: Secondary | ICD-10-CM | POA: Diagnosis not present

## 2014-10-25 DIAGNOSIS — J45909 Unspecified asthma, uncomplicated: Secondary | ICD-10-CM | POA: Diagnosis not present

## 2014-10-25 DIAGNOSIS — Z888 Allergy status to other drugs, medicaments and biological substances status: Secondary | ICD-10-CM | POA: Diagnosis not present

## 2014-10-25 DIAGNOSIS — E039 Hypothyroidism, unspecified: Secondary | ICD-10-CM | POA: Diagnosis not present

## 2014-10-25 DIAGNOSIS — I10 Essential (primary) hypertension: Secondary | ICD-10-CM | POA: Diagnosis not present

## 2014-10-25 DIAGNOSIS — N858 Other specified noninflammatory disorders of uterus: Secondary | ICD-10-CM | POA: Diagnosis not present

## 2014-10-25 DIAGNOSIS — N950 Postmenopausal bleeding: Secondary | ICD-10-CM | POA: Diagnosis not present

## 2014-10-25 DIAGNOSIS — M1990 Unspecified osteoarthritis, unspecified site: Secondary | ICD-10-CM | POA: Diagnosis not present

## 2014-10-25 LAB — BASIC METABOLIC PANEL WITH GFR
Anion gap: 11 (ref 5–15)
BUN: 16 mg/dL (ref 6–23)
CO2: 27 meq/L (ref 19–32)
Calcium: 9.8 mg/dL (ref 8.4–10.5)
Chloride: 102 meq/L (ref 96–112)
Creatinine, Ser: 0.84 mg/dL (ref 0.50–1.10)
GFR calc Af Amer: 78 mL/min — ABNORMAL LOW
GFR calc non Af Amer: 67 mL/min — ABNORMAL LOW
Glucose, Bld: 114 mg/dL — ABNORMAL HIGH (ref 70–99)
Potassium: 4.8 meq/L (ref 3.7–5.3)
Sodium: 140 meq/L (ref 137–147)

## 2014-10-25 LAB — CBC
HCT: 41.5 % (ref 36.0–46.0)
Hemoglobin: 13.9 g/dL (ref 12.0–15.0)
MCH: 31.9 pg (ref 26.0–34.0)
MCHC: 33.5 g/dL (ref 30.0–36.0)
MCV: 95.2 fL (ref 78.0–100.0)
Platelets: 191 10*3/uL (ref 150–400)
RBC: 4.36 MIL/uL (ref 3.87–5.11)
RDW: 14.3 % (ref 11.5–15.5)
WBC: 6.8 10*3/uL (ref 4.0–10.5)

## 2014-10-26 NOTE — Anesthesia Preprocedure Evaluation (Addendum)
Anesthesia Evaluation  Patient identified by MRN, date of birth, ID band Patient awake    Reviewed: Allergy & Precautions, H&P , NPO status , Patient's Chart, lab work & pertinent test results  Airway Mallampati: II  TM Distance: >3 FB Neck ROM: Full    Dental no notable dental hx. (+) Teeth Intact, Dental Advisory Given   Pulmonary asthma , Current Smoker, former smoker,  breath sounds clear to auscultation  Pulmonary exam normal       Cardiovascular hypertension, Pt. on medications Rhythm:Regular Rate:Normal     Neuro/Psych negative neurological ROS  negative psych ROS   GI/Hepatic Neg liver ROS, GERD-  ,Not taking Zantac anymore.    Endo/Other  Hypothyroidism   Renal/GU negative Renal ROS     Musculoskeletal  (+) Arthritis -,   Abdominal   Peds  Hematology  (+) anemia ,   Anesthesia Other Findings Visual hairline fx's apparent on most of pt's front teeth  Reproductive/Obstetrics negative OB ROS                        Anesthesia Physical Anesthesia Plan  ASA: II  Anesthesia Plan: General and MAC   Post-op Pain Management:    Induction: Intravenous  Airway Management Planned:   Additional Equipment:   Intra-op Plan:   Post-operative Plan: Extubation in OR  Informed Consent: I have reviewed the patients History and Physical, chart, labs and discussed the procedure including the risks, benefits and alternatives for the proposed anesthesia with the patient or authorized representative who has indicated his/her understanding and acceptance.   Dental advisory given  Plan Discussed with: CRNA  Anesthesia Plan Comments:         Anesthesia Quick Evaluation

## 2014-10-27 ENCOUNTER — Ambulatory Visit (HOSPITAL_BASED_OUTPATIENT_CLINIC_OR_DEPARTMENT_OTHER): Payer: Medicare Other | Admitting: Anesthesiology

## 2014-10-27 ENCOUNTER — Encounter (HOSPITAL_BASED_OUTPATIENT_CLINIC_OR_DEPARTMENT_OTHER): Payer: Self-pay | Admitting: *Deleted

## 2014-10-27 ENCOUNTER — Ambulatory Visit (HOSPITAL_BASED_OUTPATIENT_CLINIC_OR_DEPARTMENT_OTHER)
Admission: RE | Admit: 2014-10-27 | Discharge: 2014-10-27 | Disposition: A | Discharge status: Home / Self Care | Payer: Medicare Other | Source: Ambulatory Visit | Attending: Obstetrics and Gynecology | Admitting: Obstetrics and Gynecology

## 2014-10-27 ENCOUNTER — Encounter (HOSPITAL_BASED_OUTPATIENT_CLINIC_OR_DEPARTMENT_OTHER)
Admission: RE | Disposition: A | Discharge status: Home / Self Care | Payer: Self-pay | Source: Ambulatory Visit | Attending: Obstetrics and Gynecology

## 2014-10-27 DIAGNOSIS — E039 Hypothyroidism, unspecified: Secondary | ICD-10-CM | POA: Insufficient documentation

## 2014-10-27 DIAGNOSIS — Z888 Allergy status to other drugs, medicaments and biological substances status: Secondary | ICD-10-CM | POA: Insufficient documentation

## 2014-10-27 DIAGNOSIS — M1990 Unspecified osteoarthritis, unspecified site: Secondary | ICD-10-CM | POA: Insufficient documentation

## 2014-10-27 DIAGNOSIS — N840 Polyp of corpus uteri: Secondary | ICD-10-CM | POA: Insufficient documentation

## 2014-10-27 DIAGNOSIS — J45909 Unspecified asthma, uncomplicated: Secondary | ICD-10-CM | POA: Insufficient documentation

## 2014-10-27 DIAGNOSIS — N858 Other specified noninflammatory disorders of uterus: Secondary | ICD-10-CM | POA: Diagnosis not present

## 2014-10-27 DIAGNOSIS — F17210 Nicotine dependence, cigarettes, uncomplicated: Secondary | ICD-10-CM | POA: Insufficient documentation

## 2014-10-27 DIAGNOSIS — K219 Gastro-esophageal reflux disease without esophagitis: Secondary | ICD-10-CM | POA: Insufficient documentation

## 2014-10-27 DIAGNOSIS — Z882 Allergy status to sulfonamides status: Secondary | ICD-10-CM | POA: Insufficient documentation

## 2014-10-27 DIAGNOSIS — I10 Essential (primary) hypertension: Secondary | ICD-10-CM | POA: Diagnosis not present

## 2014-10-27 DIAGNOSIS — N950 Postmenopausal bleeding: Secondary | ICD-10-CM | POA: Diagnosis not present

## 2014-10-27 HISTORY — DX: Prediabetes: R73.03

## 2014-10-27 HISTORY — DX: Personal history of other diseases of the digestive system: Z87.19

## 2014-10-27 HISTORY — DX: Personal history of other malignant neoplasm of skin: Z85.828

## 2014-10-27 HISTORY — DX: Hypothyroidism, unspecified: E03.9

## 2014-10-27 HISTORY — DX: Polyp of corpus uteri: N84.0

## 2014-10-27 HISTORY — PX: DILATATION & CURETTAGE/HYSTEROSCOPY WITH MYOSURE: SHX6511

## 2014-10-27 HISTORY — DX: Other specified postprocedural states: Z98.890

## 2014-10-27 HISTORY — DX: Presence of spectacles and contact lenses: Z97.3

## 2014-10-27 LAB — GLUCOSE, CAPILLARY: Glucose-Capillary: 91 mg/dL (ref 70–99)

## 2014-10-27 SURGERY — DILATATION & CURETTAGE/HYSTEROSCOPY WITH MYOSURE
Anesthesia: Monitor Anesthesia Care | Site: Vagina

## 2014-10-27 MED ORDER — CEFAZOLIN SODIUM-DEXTROSE 2-3 GM-% IV SOLR
2.0000 g | INTRAVENOUS | Status: AC
Start: 2014-10-27 — End: 2014-10-27
  Administered 2014-10-27: 2 g via INTRAVENOUS
  Filled 2014-10-27: qty 50

## 2014-10-27 MED ORDER — FENTANYL CITRATE 0.05 MG/ML IJ SOLN
INTRAMUSCULAR | Status: DC | PRN
  Administered 2014-10-27: 25 ug via INTRAVENOUS
  Administered 2014-10-27: 50 ug via INTRAVENOUS

## 2014-10-27 MED ORDER — LIDOCAINE HCL (CARDIAC) 20 MG/ML IV SOLN
INTRAVENOUS | Status: DC | PRN
  Administered 2014-10-27: 50 mg via INTRAVENOUS

## 2014-10-27 MED ORDER — ONDANSETRON HCL 4 MG/2ML IJ SOLN
INTRAMUSCULAR | Status: DC | PRN
  Administered 2014-10-27: 4 mg via INTRAVENOUS

## 2014-10-27 MED ORDER — MEPERIDINE HCL 25 MG/ML IJ SOLN
6.2500 mg | INTRAMUSCULAR | Status: DC | PRN
  Filled 2014-10-27: qty 1

## 2014-10-27 MED ORDER — OXYCODONE HCL 5 MG PO TABS
5.0000 mg | ORAL_TABLET | Freq: Once | ORAL | Status: DC | PRN
  Filled 2014-10-27: qty 1

## 2014-10-27 MED ORDER — FENTANYL CITRATE 0.05 MG/ML IJ SOLN
INTRAMUSCULAR | Status: AC
  Filled 2014-10-27: qty 4

## 2014-10-27 MED ORDER — DEXAMETHASONE SODIUM PHOSPHATE 4 MG/ML IJ SOLN
INTRAMUSCULAR | Status: DC | PRN
  Administered 2014-10-27: 10 mg via INTRAVENOUS

## 2014-10-27 MED ORDER — CEFAZOLIN SODIUM-DEXTROSE 2-3 GM-% IV SOLR
INTRAVENOUS | Status: AC
  Filled 2014-10-27: qty 50

## 2014-10-27 MED ORDER — SODIUM CHLORIDE 0.9 % IR SOLN
Status: DC | PRN
  Administered 2014-10-27: 3000 mL

## 2014-10-27 MED ORDER — LACTATED RINGERS IV SOLN
INTRAVENOUS | Status: DC
  Administered 2014-10-27: 08:00:00 via INTRAVENOUS
  Filled 2014-10-27: qty 1000

## 2014-10-27 MED ORDER — PROPOFOL INFUSION 10 MG/ML OPTIME
INTRAVENOUS | Status: DC | PRN
  Administered 2014-10-27 (×2): 200 ug/kg/min via INTRAVENOUS

## 2014-10-27 MED ORDER — HYDROMORPHONE HCL 1 MG/ML IJ SOLN
0.2500 mg | INTRAMUSCULAR | Status: DC | PRN
  Filled 2014-10-27: qty 1

## 2014-10-27 MED ORDER — LIDOCAINE HCL 1 % IJ SOLN
INTRAMUSCULAR | Status: DC | PRN
  Administered 2014-10-27: 20 mL

## 2014-10-27 MED ORDER — MIDAZOLAM HCL 5 MG/5ML IJ SOLN
INTRAMUSCULAR | Status: DC | PRN
  Administered 2014-10-27: 2 mg via INTRAVENOUS

## 2014-10-27 MED ORDER — MIDAZOLAM HCL 2 MG/2ML IJ SOLN
INTRAMUSCULAR | Status: AC
  Filled 2014-10-27: qty 2

## 2014-10-27 MED ORDER — PROMETHAZINE HCL 25 MG/ML IJ SOLN
6.2500 mg | INTRAMUSCULAR | Status: DC | PRN
  Filled 2014-10-27: qty 1

## 2014-10-27 MED ORDER — PROPOFOL 10 MG/ML IV BOLUS
INTRAVENOUS | Status: DC | PRN
  Administered 2014-10-27: 20 mg via INTRAVENOUS

## 2014-10-27 MED ORDER — OXYCODONE HCL 5 MG/5ML PO SOLN
5.0000 mg | Freq: Once | ORAL | Status: DC | PRN
  Filled 2014-10-27: qty 5

## 2014-10-27 SURGICAL SUPPLY — 39 items
CANISTER SUCTION 2500CC (MISCELLANEOUS) ×3 IMPLANT
CATH ROBINSON RED A/P 16FR (CATHETERS) IMPLANT
CLOTH BEACON ORANGE TIMEOUT ST (SAFETY) ×3 IMPLANT
CORD ACTIVE DISPOSABLE (ELECTRODE)
CORD ELECTRO ACTIVE DISP (ELECTRODE) IMPLANT
COVER TABLE BACK 60X90 (DRAPES) ×3 IMPLANT
DEVICE MYOSURE CLASSIC (MISCELLANEOUS) IMPLANT
DEVICE MYOSURE LITE (MISCELLANEOUS) ×3 IMPLANT
DRAPE CAMERA CLOSED 9X96 (DRAPES) ×3 IMPLANT
DRAPE HYSTEROSCOPY (DRAPE) ×3 IMPLANT
DRAPE LG THREE QUARTER DISP (DRAPES) ×3 IMPLANT
DRAPE UNDERBUTTOCKS STRL (DRAPE) IMPLANT
DRESSING TELFA ISLAND 4X8 (GAUZE/BANDAGES/DRESSINGS) ×3 IMPLANT
ELECT LOOP GYNE PRO 24FR (CUTTING LOOP)
ELECT REM PT RETURN 9FT ADLT (ELECTROSURGICAL) ×3
ELECT VAPORTRODE GRVD BAR (ELECTRODE) IMPLANT
ELECTRODE LOOP GYNE PRO 24FR (CUTTING LOOP) IMPLANT
ELECTRODE REM PT RTRN 9FT ADLT (ELECTROSURGICAL) ×1 IMPLANT
ELECTRODE VAPORCUT 22FR (ELECTROSURGICAL) IMPLANT
GLOVE BIO SURGEON STRL SZ7 (GLOVE) ×6 IMPLANT
GLOVE BIOGEL M 6.5 STRL (GLOVE) ×3 IMPLANT
GLOVE BIOGEL PI IND STRL 6.5 (GLOVE) ×1 IMPLANT
GLOVE BIOGEL PI IND STRL 7.5 (GLOVE) ×1 IMPLANT
GLOVE BIOGEL PI INDICATOR 6.5 (GLOVE) ×2
GLOVE BIOGEL PI INDICATOR 7.5 (GLOVE) ×2
GOWN PREVENTION PLUS LG XLONG (DISPOSABLE) IMPLANT
GOWN STRL REUS W/TWL LRG LVL3 (GOWN DISPOSABLE) ×3 IMPLANT
GOWN STRL REUS W/TWL XL LVL3 (GOWN DISPOSABLE) ×3 IMPLANT
HOLOGIC FLUID CONTROL SYSTEM ×3 IMPLANT
IV NS IRRIG 3000ML ARTHROMATIC (IV SOLUTION) ×3 IMPLANT
LEGGING LITHOTOMY PAIR STRL (DRAPES) ×3 IMPLANT
NS IRRIG 500ML POUR BTL (IV SOLUTION) ×3 IMPLANT
PACK BASIN DAY SURGERY FS (CUSTOM PROCEDURE TRAY) ×3 IMPLANT
PAD OB MATERNITY 4.3X12.25 (PERSONAL CARE ITEMS) ×3 IMPLANT
PAD PREP 24X48 CUFFED NSTRL (MISCELLANEOUS) ×3 IMPLANT
SEAL ROD LENS SCOPE MYOSURE (ABLATOR) ×3 IMPLANT
TOWEL OR 17X24 6PK STRL BLUE (TOWEL DISPOSABLE) ×6 IMPLANT
TUBE HYSTEROSCOPY W Y-CONNECT (TUBING) IMPLANT
WATER STERILE IRR 500ML POUR (IV SOLUTION) ×3 IMPLANT

## 2014-10-27 NOTE — Discharge Instructions (Signed)
° °  D & C Home care Instructions:   Personal hygiene:  Used sanitary napkins for vaginal drainage not tampons. Shower or tub bathe the day after your procedure. No douching until bleeding stops. Always wipe from front to back after  Elimination.  Activity: Do not drive or operate any equipment today. The effects of the anesthesia are still present and drowsiness may result. Rest today, not necessarily flat bed rest, just take it easy. You may resume your normal activity in one to 2 days.  Sexual activity: No intercourse for one week or as indicated by your physician  Diet: Eat a light diet as desired this evening. You may resume a regular diet tomorrow.  Return to work: One to 2 days.  General Expectations of your surgery: Vaginal bleeding should be no heavier than a normal period. Spotting may continue up to 10 days. Mild cramps may continue for a couple of days. You may have a regular period in 2-6 weeks.  Unexpected observations call your doctor if these occur: persistent or heavy bleeding. Severe abdominal cramping or pain. Elevation of temperature greater than 100.4 F.  Call for an appointment in one week.      Post Anesthesia Home Care Instructions  Activity: Get plenty of rest for the remainder of the day. A responsible adult should stay with you for 24 hours following the procedure.  For the next 24 hours, DO NOT: -Drive a car -Paediatric nurse -Drink alcoholic beverages -Take any medication unless instructed by your physician -Make any legal decisions or sign important papers.  Meals: Start with liquid foods such as gelatin or soup. Progress to regular foods as tolerated. Avoid greasy, spicy, heavy foods. If nausea and/or vomiting occur, drink only clear liquids until the nausea and/or vomiting subsides. Call your physician if vomiting continues.  Special Instructions/Symptoms: Your throat may feel dry or sore from the anesthesia or the breathing tube placed in your  throat during surgery. If this causes discomfort, gargle with warm salt water. The discomfort should disappear within 24 hours.

## 2014-10-27 NOTE — Op Note (Signed)
[name], Debbie Houston                ACCOUNT NO.:  [id number]  MEDICAL RECORD NO.:  [id number]  LOCATION:                                 FACILITY:  PHYSICIAN:  Darlyn Chamber, M.D.   DATE OF BIRTH:  1941/03/18  DATE OF PROCEDURE:  10/27/2014 DATE OF DISCHARGE:                              OPERATIVE REPORT   PREOPERATIVE DIAGNOSIS:  Endometrial thickening in patient with postmenopausal bleeding.  POSTOPERATIVE DIAGNOSIS:  Basically, normal-appearing endometrium which is relatively atrophic.  PROCEDURE:  Paracervical block.  Cervical dilatation.  Resection of endometrium using the MyoSure.  Endometrial curettings.  SURGEON:  Darlyn Chamber, MD  ANESTHESIA:  Paracervical block and sedation.  BLOOD LOSS:  Minimal.  PACKS AND DRAINS:  None.  INTRAOPERATIVE BLOOD PLACED:  None.  COMPLICATIONS:  None.  INDICATION:  Dictated in history and physical.  PROCEDURE IN DETAIL:  As follows; the patient was taken to the OR and placed in supine position.  After sedation, the patient was placed in the dorsal lithotomy position using Allen stirrups.  Perineum and vagina were prepped out with Betadine and draped in sterile field.  Speculum was placed in vaginal vault.  Cervix was grasped with single-tooth tenaculum.  Paracervical block was instituted using 20 mL of Xylocaine. Cervix was serially dilated to a size 21 Pratt dilator.  The MyoSure device was inserted.  Intrauterine cavity was distended using saline. Visualization revealed a very smooth and atrophic endometrium.  There was a slight thickening posteriorly, but I think, this was just normal, but this is what we were seeing on the saline infusion ultrasound.  We brought in the MyoSure.  We resected that area along with multiple areas along the endometrium and endocervical canal.  These were all sent for pathological review.  The MyoSure device was removed.  Total deficit 300 mL.  Endometrial curettings were then obtained.  Both  were sent for pathological review.  At this point in time, the single-tooth tenaculum and speculum were removed.  The patient was taken out of the dorsal lithotomy position.  Once alert, was transferred to recovery room in good condition.  Sponge, instrument, and needle count were correct by circulating nurse.     Darlyn Chamber, M.D.     JSM/MEDQ  D:  10/27/2014  T:  10/27/2014  Job:  [id number]

## 2014-10-27 NOTE — Anesthesia Postprocedure Evaluation (Signed)
Anesthesia Post Note  Patient: Debbie Houston  Procedure(s) Performed: Procedure(s) (LRB): DILATATION & CURETTAGE/HYSTEROSCOPY WITH MYOSURE (N/A)  Anesthesia type: MAC  Patient location: PACU  Post pain: Pain level controlled  Post assessment: Post-op Vital signs reviewed  Last Vitals: BP 127/87 mmHg  Pulse 75  Temp(Src) 36.6 C (Oral)  Resp 16  Ht 5' 5.5" (1.664 m)  Wt 109 lb 8 oz (49.669 kg)  BMI 17.94 kg/m2  SpO2 95%  Post vital signs: Reviewed  Level of consciousness: awake  Complications: No apparent anesthesia complications

## 2014-10-27 NOTE — Transfer of Care (Signed)
Immediate Anesthesia Transfer of Care Note  Patient: Debbie Houston  Procedure(s) Performed: Procedure(s) with comments: DILATATION & CURETTAGE/HYSTEROSCOPY WITH MYOSURE (N/A) - deficit 250  Patient Location: PACU  Anesthesia Type:MAC  Level of Consciousness: awake, alert , oriented and patient cooperative  Airway & Oxygen Therapy: Patient Spontanous Breathing and Patient connected to nasal cannula oxygen  Post-op Assessment: Report given to PACU RN and Post -op Vital signs reviewed and stable  Post vital signs: Reviewed and stable  Complications: No apparent anesthesia complications

## 2014-10-27 NOTE — H&P (Signed)
  History and physical exam unchanged 

## 2014-10-27 NOTE — Anesthesia Procedure Notes (Signed)
Procedure Name: MAC Date/Time: 10/27/2014 8:35 AM Performed by: Wanita Chamberlain Pre-anesthesia Checklist: Patient identified, Timeout performed, Emergency Drugs available, Suction available and Patient being monitored Patient Re-evaluated:Patient Re-evaluated prior to inductionOxygen Delivery Method: Nasal cannula Intubation Type: IV induction Placement Confirmation: positive ETCO2 Dental Injury: Teeth and Oropharynx as per pre-operative assessment

## 2014-10-27 NOTE — Brief Op Note (Signed)
10/27/2014  8:58 AM  PATIENT:  Debbie Houston  73 y.o. female  PRE-OPERATIVE DIAGNOSIS:  polyp  POST-OPERATIVE DIAGNOSIS:  polyp  PROCEDURE:  Procedure(s) with comments: DILATATION & CURETTAGE/HYSTEROSCOPY WITH MYOSURE (N/A) - deficit 250  SURGEON:  Surgeon(s) and Role:    * Darlyn Chamber, MD - Primary  PHYSICIAN ASSISTANT:   ASSISTANTS: none   ANESTHESIA:   IV sedation and paracervical block  EBL:  Total I/O In: -  Out: 100 [Urine:100]  BLOOD ADMINISTERED:none  DRAINS: none   LOCAL MEDICATIONS USED:  XYLOCAINE   SPECIMEN:  Source of Specimen:  endometrial resections andd currettings  DISPOSITION OF SPECIMEN:  PATHOLOGY  COUNTS:  YES  TOURNIQUET:  * No tourniquets in log *  DICTATION: .Other Dictation: Dictation Number [id number]  PLAN OF CARE: Discharge to home after PACU  PATIENT DISPOSITION:  PACU - hemodynamically stable.   Delay start of Pharmacological VTE agent (>24hrs) due to surgical blood loss or risk of bleeding: not applicable

## 2014-10-28 ENCOUNTER — Encounter (HOSPITAL_BASED_OUTPATIENT_CLINIC_OR_DEPARTMENT_OTHER): Payer: Self-pay | Admitting: Obstetrics and Gynecology

## 2014-11-04 DIAGNOSIS — Z8262 Family history of osteoporosis: Secondary | ICD-10-CM | POA: Diagnosis not present

## 2014-11-04 DIAGNOSIS — Z1231 Encounter for screening mammogram for malignant neoplasm of breast: Secondary | ICD-10-CM | POA: Diagnosis not present

## 2014-11-04 DIAGNOSIS — M8580 Other specified disorders of bone density and structure, unspecified site: Secondary | ICD-10-CM | POA: Diagnosis not present

## 2014-11-10 ENCOUNTER — Telehealth: Payer: Self-pay | Admitting: *Deleted

## 2014-11-10 NOTE — Telephone Encounter (Signed)
Patient aware of BMD results.

## 2014-11-23 DIAGNOSIS — I83811 Varicose veins of right lower extremities with pain: Secondary | ICD-10-CM | POA: Diagnosis not present

## 2014-11-27 ENCOUNTER — Encounter: Payer: Self-pay | Admitting: *Deleted

## 2014-12-15 ENCOUNTER — Encounter: Payer: Self-pay | Admitting: Emergency Medicine

## 2014-12-15 DIAGNOSIS — H52223 Regular astigmatism, bilateral: Secondary | ICD-10-CM | POA: Diagnosis not present

## 2014-12-15 DIAGNOSIS — H2513 Age-related nuclear cataract, bilateral: Secondary | ICD-10-CM | POA: Diagnosis not present

## 2014-12-15 DIAGNOSIS — H524 Presbyopia: Secondary | ICD-10-CM | POA: Diagnosis not present

## 2014-12-15 DIAGNOSIS — H5213 Myopia, bilateral: Secondary | ICD-10-CM | POA: Diagnosis not present

## 2014-12-17 DIAGNOSIS — R197 Diarrhea, unspecified: Secondary | ICD-10-CM | POA: Diagnosis not present

## 2014-12-21 ENCOUNTER — Ambulatory Visit (INDEPENDENT_AMBULATORY_CARE_PROVIDER_SITE_OTHER): Payer: Medicare Other | Admitting: Physician Assistant

## 2014-12-21 ENCOUNTER — Encounter: Payer: Self-pay | Admitting: Physician Assistant

## 2014-12-21 VITALS — BP 128/70 | HR 76 | Temp 98.0°F | Resp 18 | Ht 65.0 in | Wt 110.0 lb

## 2014-12-21 DIAGNOSIS — E039 Hypothyroidism, unspecified: Secondary | ICD-10-CM | POA: Diagnosis not present

## 2014-12-21 DIAGNOSIS — D649 Anemia, unspecified: Secondary | ICD-10-CM | POA: Diagnosis not present

## 2014-12-21 DIAGNOSIS — I10 Essential (primary) hypertension: Secondary | ICD-10-CM | POA: Diagnosis not present

## 2014-12-21 DIAGNOSIS — K529 Noninfective gastroenteritis and colitis, unspecified: Secondary | ICD-10-CM | POA: Insufficient documentation

## 2014-12-21 DIAGNOSIS — M542 Cervicalgia: Secondary | ICD-10-CM

## 2014-12-21 DIAGNOSIS — Z79899 Other long term (current) drug therapy: Secondary | ICD-10-CM | POA: Diagnosis not present

## 2014-12-21 DIAGNOSIS — M1990 Unspecified osteoarthritis, unspecified site: Secondary | ICD-10-CM

## 2014-12-21 DIAGNOSIS — R7309 Other abnormal glucose: Secondary | ICD-10-CM | POA: Diagnosis not present

## 2014-12-21 DIAGNOSIS — R7303 Prediabetes: Secondary | ICD-10-CM

## 2014-12-21 DIAGNOSIS — K2100 Gastro-esophageal reflux disease with esophagitis, without bleeding: Secondary | ICD-10-CM

## 2014-12-21 DIAGNOSIS — M8580 Other specified disorders of bone density and structure, unspecified site: Secondary | ICD-10-CM

## 2014-12-21 DIAGNOSIS — E559 Vitamin D deficiency, unspecified: Secondary | ICD-10-CM | POA: Diagnosis not present

## 2014-12-21 DIAGNOSIS — E785 Hyperlipidemia, unspecified: Secondary | ICD-10-CM | POA: Diagnosis not present

## 2014-12-21 DIAGNOSIS — R202 Paresthesia of skin: Secondary | ICD-10-CM

## 2014-12-21 DIAGNOSIS — J45909 Unspecified asthma, uncomplicated: Secondary | ICD-10-CM

## 2014-12-21 DIAGNOSIS — T7840XA Allergy, unspecified, initial encounter: Secondary | ICD-10-CM

## 2014-12-21 DIAGNOSIS — R42 Dizziness and giddiness: Secondary | ICD-10-CM

## 2014-12-21 DIAGNOSIS — H9191 Unspecified hearing loss, right ear: Secondary | ICD-10-CM

## 2014-12-21 NOTE — Progress Notes (Signed)
Complete Physical  Assessment and Plan: 1. Essential hypertension - CBC with Differential/Platelet - BASIC METABOLIC PANEL WITH GFR - Hepatic function panel - Urinalysis, Routine w reflex microscopic - Microalbumin / creatinine urine ratio - EKG 12-Lead - [place], RETROPERITNL ABD,  LTD  2. Hypothyroidism, unspecified hypothyroidism type - TSH  3. Hyperlipidemia - Lipid panel  4. Prediabetes Discussed general issues about diabetes pathophysiology and management., Educational material distributed., Suggested low cholesterol diet., Encouraged aerobic exercise., Discussed foot care., Reminded to get yearly retinal exam. - Hemoglobin A1c - Insulin, fasting - HM DIABETES FOOT EXAM  5. Asthma, unspecified asthma severity, uncomplicated controlled  6. GERD Continue PPI/H2 blocker, diet discussed  7. Arthritis Treat PRN  8. Osteoarthritis, unspecified osteoarthritis type, unspecified site Aleve PRN  9. Osteopenia Osteopenia- get dexa, continue Vit D and Ca, weight bearing exercises  10. Vitamin D deficiency - Vit D  25 hydroxy (rtn osteoporosis monitoring)  11. Allergy, initial encounter Continue OTC allergy pills  12. Anemia, unspecified anemia type - Iron and TIBC - Ferritin - Folate RBC  13. Medication management - Magnesium  14. Colitis Continue follow up Dr. Watt Climes  15. Dizziness With decrease hearing, tinnitus, rule out acoustic neuroma/mass - MR Brain Wo Contrast; Future  16 Paresthesia - Vitamin B12 - Zinc  Discussed med's effects and SE's. Screening labs and tests as requested with regular follow-up as recommended.  HPI  74 y.o. female  presents for a complete physical.  Her blood pressure has been controlled at home, today their BP is BP: 128/70 mmHg She does workout. She denies chest pain, shortness of breath, dizziness.  She is on cholesterol medication, zocor 83m started to take every other day and denies myalgias. Her cholesterol is at goal. The  cholesterol last visit was:   Lab Results  Component Value Date   CHOL 171 07/23/2014   HDL 71 07/23/2014   LDLCALC 86 07/23/2014   TRIG 71 07/23/2014   CHOLHDL 2.4 07/23/2014   She has been working on diet and exercise for prediabetes,  and denies paresthesia of the feet, polydipsia, polyuria and visual disturbances. Last A1C in the office was:  Lab Results  Component Value Date   HGBA1C 5.9* 07/23/2014   Patient is on Vitamin D supplement.   Lab Results  Component Value Date   VD25OH 78806/12/2013     She follows with Dr. MWatt Climes she is on entocort 360mhas been taking 3 a day, she has been having some diarrhea, normal colonoscopy 09/2014, gave him stool samples to check for infection, still pending results.  She also complains of bilateral feet cramping and decreased sensation, sees a vascular doctor and states that it is nerves.  Decrease hearing right ear, with ringing in her ears and some dizziness.  Was having suprapubic pain, had pelvic [place] showed uterine polyp and endometrial lining thickening, saw Dr McRadene Kneend had removal, was benign, and she is now off the tamoxifen.  She was on tamoxifen for osteopenia, off due to endometrial hyperplasia, last DEXA 2015, osteopenia, no change.  She was complaining of right neck pain in July and is now complaining of it again, she has right occipital pain that will radiate to her temporal area. Had normal ESR at that time.  She is on thyroid medication. Her medication was not changed last visit.   Lab Results  Component Value Date   TSH 2.662 07/23/2014  .   Current Medications:  Current Outpatient Prescriptions on File Prior to Visit  Medication Sig Dispense Refill  . aspirin 81 MG tablet Take 81 mg by mouth daily.    Marland Kitchen BIOTIN PO Take 1 tablet by mouth daily.    . budesonide (ENTOCORT EC) 3 MG 24 hr capsule Take 3 mg by mouth 3 (three) times daily as needed.     . Calcium Carb-Cholecalciferol (CALCIUM 600 + D PO) Take 1 tablet by  mouth 2 (two) times daily.    . cetirizine (ZYRTEC) 10 MG tablet Take 10 mg by mouth daily.    . Cholecalciferol (VITAMIN D3) 2000 UNITS TABS Take 1 capsule by mouth daily.    . fish oil-omega-3 fatty acids 1000 MG capsule Take 1 g by mouth daily.     . Flaxseed, Linseed, (FLAX SEED OIL) 1000 MG CAPS Take 1,000 mg by mouth daily.    Marland Kitchen levothyroxine (SYNTHROID, LEVOTHROID) 125 MCG tablet Take 1 tablet (125 mcg total) by mouth daily before breakfast. (Patient taking differently: Take 125 mcg by mouth daily before breakfast. ONE TABLET   T/ TH/ SAT--  HALF TABLET  M/W/F/SUN) 90 tablet 1  . Multiple Vitamins-Minerals (MULTIVITAMIN WITH MINERALS) tablet Take 1 tablet by mouth daily.    . Probiotic Product (PROBIOTIC DAILY PO) Take 1 tablet by mouth daily.     . simvastatin (ZOCOR) 20 MG tablet TAKE 1 TABLET BY MOUTH EVERY DAY (Patient taking differently: TAKE 1 TABLET BY MOUTH EVERY DAY-- TAKES IN PM) 30 tablet 3  . triamcinolone (NASACORT) 55 MCG/ACT AERO nasal inhaler Place 2 sprays into the nose at bedtime as needed.    . vitamin B-12 (CYANOCOBALAMIN) 1000 MCG tablet Take 1,000 mcg by mouth daily.    . vitamin C (ASCORBIC ACID) 500 MG tablet Take 500 mg by mouth daily.     No current facility-administered medications on file prior to visit.   Health Maintenance:   Immunization History  Administered Date(s) Administered  . Influenza, High Dose Seasonal PF 07/23/2014  . Pneumococcal-Unspecified 08/22/2011  . Tdap 08/22/2011  . Zoster 06/20/2006   Last colonoscopy: Dr. Watt Climes  09/2014 EGD: 09/2014 Last mammogram: 09/2014 Last pap smear/pelvic exam: 2012 DEXA:09/2014 osteopenia- she is off the tamoxifen 07/2014 US pelvis/AB- + polyp   Prior vaccinations: TD or Tdap: 2012 Influenza: 07/2014 Pneumococcal: 2012 Prevnar 13: DUE Shingles/Zostavax: 2007  Patient Care Team: Unk Pinto, MD as PCP - General (Internal Medicine)  Allergies:  Allergies  Allergen Reactions   . Fosamax [Alendronate Sodium] Other (See Comments)    Gi uPSET  . Singulair [Montelukast Sodium] Other (See Comments)    JITTERS  . Wellbutrin [Bupropion] Hives  . Sulfa Antibiotics Rash   Medical History:  Past Medical History  Diagnosis Date  . Hyperlipidemia   . GERD (gastroesophageal reflux disease)   . Arthritis   . DJD (degenerative joint disease)   . Osteopenia   . Vitamin D deficiency   . Colitis   . Benign labile hypertension   . Hypothyroidism   . History of basal cell carcinoma excision   . Prediabetes   . History of esophagitis   . Endometrial polyp   . Wears glasses    Surgical History:  Past Surgical History  Procedure Laterality Date  . Mohs surgery  2005    LEFT NASAL BRIDGE FOR BASAL CELL  . Tonsillectomy  age 23  . Dilation and curettage of uterus    . Dilatation & curettage/hysteroscopy with myosure N/A 10/27/2014    Procedure: DILATATION & CURETTAGE/HYSTEROSCOPY WITH MYOSURE;  Surgeon: Darlyn Chamber,  MD;  Location: [facility];  Service: Gynecology;  Laterality: N/A;   Family History:  Family History  Problem Relation Age of Onset  . Hypertension Mother   . Cancer Mother     COLON  . Hyperlipidemia Mother   . Cancer Father     BREAST WITH BRAIN METS  . Hyperlipidemia Father   . Cancer Sister     COLON (TEENS), BREAST (22)  . Hyperlipidemia Sister   . Cancer Maternal Grandmother     RENAL  . Hyperlipidemia Maternal Grandmother   . Diabetes Brother   . Hyperlipidemia Brother   . Diabetes Maternal Grandfather   . Stroke Maternal Grandfather   . Hyperlipidemia Maternal Grandfather   . Hyperlipidemia Paternal Grandmother   . Hyperlipidemia Paternal Grandfather    Social History:  History  Substance Use Topics  . Smoking status: Light Tobacco Smoker -- 15 years    Types: Cigarettes  . Smokeless tobacco: Never Used     Comment: RARELY SMOKES  . Alcohol Use: Yes     Comment: OCCASIONAL    Review of Systems: Review of  Systems  Constitutional: Negative for fever, chills, weight loss, malaise/fatigue and diaphoresis.  HENT: Positive for hearing loss and tinnitus. Negative for congestion, ear discharge, ear pain, nosebleeds and sore throat.   Eyes: Negative.   Respiratory: Negative.  Negative for stridor.   Cardiovascular: Negative.   Gastrointestinal: Positive for abdominal pain and diarrhea. Negative for heartburn, nausea, vomiting, constipation, blood in stool and melena.  Genitourinary: Negative.   Musculoskeletal: Positive for neck pain. Negative for myalgias, back pain, joint pain and falls.  Skin: Negative.   Neurological: Positive for dizziness and sensory change. Negative for tingling, tremors, speech change, focal weakness, seizures, loss of consciousness, weakness and headaches.  Psychiatric/Behavioral: Negative.     Physical Exam: Estimated body mass index is 18.31 kg/(m^2) as calculated from the following:   Height as of this encounter: _0  (1.651 m).   Weight as of this encounter: 110 lb (49.896 kg). BP 128/70 mmHg  Pulse 76  Temp(Src) 98 F (36.7 C) (Temporal)  Resp 18  Ht _1  (1.651 m)  Wt 110 lb (49.896 kg)  BMI 18.31 kg/m2 General Appearance: Well nourished, in no apparent distress.  Eyes: PERRLA, EOMs, conjunctiva no swelling or erythema, normal fundi and vessels.  Sinuses: No Frontal/maxillary tenderness  ENT/Mouth: Ext aud canals clear, normal light reflex with TMs without erythema, bulging. Good dentition. No erythema, swelling, or exudate on post pharynx. Tonsils not swollen or erythematous. Hearing normal.  Neck: Supple, thyroid normal. No bruits  Respiratory: Respiratory effort normal, BS equal bilaterally without rales, rhonchi, wheezing or stridor.  Cardio: RRR without murmurs, rubs or gallops. Brisk peripheral pulses without edema.  Chest: symmetric, with normal excursions and percussion.  Breasts: Symmetric, without lumps, nipple discharge, retractions.  Abdomen:  Soft, nontender, no guarding, rebound, hernias, masses, or organomegaly. .  Lymphatics: Non tender without lymphadenopathy.  Genitourinary:  Musculoskeletal: Full ROM all peripheral extremities,5/5 strength, and normal gait.  Skin: Warm, dry without rashes, lesions, ecchymosis. Neuro: Cranial nerves intact, reflexes equal bilaterally. Normal muscle tone, no cerebellar symptoms. Sensation intact.  Psych: Awake and oriented X 3, normal affect, Insight and Judgment appropriate.   EKG: WNL no changes. AORTA SCAN: WNL   Vicie Mutters 3:37 PM [facility]

## 2014-12-21 NOTE — Patient Instructions (Signed)
What is the TMJ? The temporomandibular (tem-PUH-ro-man-DIB-yoo-ler) joint, or the TMJ, connects the upper and lower jawbones. This joint allows the jaw to open wide and move back and forth when you chew, talk, or yawn.There are also several muscles that help this joint move. There can be muscle tightness and pain in the muscle that can cause several symptoms.  What causes TMJ pain? There are many causes of TMJ pain. Repeated chewing (for example, chewing gum) and clenching your teeth can cause pain in the joint. Some TMJ pain has no obvious cause. What can I do to ease the pain? There are many things you can do to help your pain get better. When you have pain:  Eat soft foods and stay away from chewy foods (for example, taffy) Try to use both sides of your mouth to chew Don't chew gum Massage Don't open your mouth wide (for example, during yawning or singing) Don't bite your cheeks or fingernails Lower your amount of stress and worry Applying a warm, damp washcloth to the joint may help. Over-the-counter pain medicines such as ibuprofen (one brand: Advil) or acetaminophen (one brand: Tylenol) might also help. Do not use these medicines if you are allergic to them or if your doctor told you not to use them. How can I stop the pain from coming back? When your pain is better, you can do these exercises to make your muscles stronger and to keep the pain from coming back:  Resisted mouth opening: Place your thumb or two fingers under your chin and open your mouth slowly, pushing up lightly on your chin with your thumb. Hold for three to six seconds. Close your mouth slowly. Resisted mouth closing: Place your thumbs under your chin and your two index fingers on the ridge between your mouth and the bottom of your chin. Push down lightly on your chin as you close your mouth. Tongue up: Slowly open and close your mouth while keeping the tongue touching the roof of the mouth. Side-to-side jaw movement:  Place an object about one fourth of an inch thick (for example, two tongue depressors) between your front teeth. Slowly move your jaw from side to side. Increase the thickness of the object as the exercise becomes easier Forward jaw movement: Place an object about one fourth of an inch thick between your front teeth and move the bottom jaw forward so that the bottom teeth are in front of the top teeth. Increase the thickness of the object as the exercise becomes easier. These exercises should not be painful. If it hurts to do these exercises, stop doing them and talk to your family doctor.  Preventative Care for Adults - Female      MAINTAIN REGULAR HEALTH EXAMS:  A routine yearly physical is a good way to check in with your primary care provider about your health and preventive screening. It is also an opportunity to share updates about your health and any concerns you have, and receive a thorough all-over exam.   Most health insurance companies pay for at least some preventative services.  Check with your health plan for specific coverages.  WHAT PREVENTATIVE SERVICES DO WOMEN NEED?  Adult women should have their weight and blood pressure checked regularly.   Women age 56 and older should have their cholesterol levels checked regularly.  Women should be screened for cervical cancer with a Pap smear and pelvic exam beginning at either age 32, or 3 years after they become sexually activity.  Breast cancer screening generally begins at age 40 with a mammogram and breast exam by your primary care provider.    Beginning at age 36 and continuing to age 29, women should be screened for colorectal cancer.  Certain people may need continued testing until age 29.  Updating vaccinations is part of preventative care.  Vaccinations help protect against diseases such as the flu.  Osteoporosis is a disease in which the bones lose minerals and strength as we age. Women ages 59 and over should discuss  this with their caregivers, as should women after menopause who have other risk factors.  Lab tests are generally done as part of preventative care to screen for anemia and blood disorders, to screen for problems with the kidneys and liver, to screen for bladder problems, to check blood sugar, and to check your cholesterol level.  Preventative services generally include counseling about diet, exercise, avoiding tobacco, drugs, excessive alcohol consumption, and sexually transmitted infections.    GENERAL RECOMMENDATIONS FOR GOOD HEALTH:  Healthy diet:  Eat a variety of foods, including fruit, vegetables, animal or vegetable protein, such as meat, fish, chicken, and eggs, or beans, lentils, tofu, and grains, such as rice.  Drink plenty of water daily.  Decrease saturated fat in the diet, avoid lots of red meat, processed foods, sweets, fast foods, and fried foods.  Exercise:  Aerobic exercise helps maintain good heart health. At least 30-40 minutes of moderate-intensity exercise is recommended. For example, a brisk walk that increases your heart rate and breathing. This should be done on most days of the week.   Find a type of exercise or a variety of exercises that you enjoy so that it becomes a part of your daily life.  Examples are running, walking, swimming, water aerobics, and biking.  For motivation and support, explore group exercise such as aerobic class, spin class, Zumba, Yoga,or  martial arts, etc.    Set exercise goals for yourself, such as a certain weight goal, walk or run in a race such as a 5k walk/run.  Speak to your primary care provider about exercise goals.  Disease prevention:  If you smoke or chew tobacco, find out from your caregiver how to quit. It can literally save your life, no matter how long you have been a tobacco user. If you do not use tobacco, never begin.   Maintain a healthy diet and normal weight. Increased weight leads to problems with blood pressure  and diabetes.   The Body Mass Index or BMI is a way of measuring how much of your body is fat. Having a BMI above 27 increases the risk of heart disease, diabetes, hypertension, stroke and other problems related to obesity. Your caregiver can help determine your BMI and based on it develop an exercise and dietary program to help you achieve or maintain this important measurement at a healthful level.  High blood pressure causes heart and blood vessel problems.  Persistent high blood pressure should be treated with medicine if weight loss and exercise do not work.   Fat and cholesterol leaves deposits in your arteries that can block them. This causes heart disease and vessel disease elsewhere in your body.  If your cholesterol is found to be high, or if you have heart disease or certain other medical conditions, then you may need to have your cholesterol monitored frequently and be treated with medication.   Ask if you should have a cardiac stress test if your history suggests this. A stress  test is a test done on a treadmill that looks for heart disease. This test can find disease prior to there being a problem.  Menopause can be associated with physical symptoms and risks. Hormone replacement therapy is available to decrease these. You should talk to your caregiver about whether starting or continuing to take hormones is right for you.   Osteoporosis is a disease in which the bones lose minerals and strength as we age. This can result in serious bone fractures. Risk of osteoporosis can be identified using a bone density scan. Women ages 50 and over should discuss this with their caregivers, as should women after menopause who have other risk factors. Ask your caregiver whether you should be taking a calcium supplement and Vitamin D, to reduce the rate of osteoporosis.   Avoid drinking alcohol in excess (more than two drinks per day).  Avoid use of street drugs. Do not share needles with anyone. Ask  for professional help if you need assistance or instructions on stopping the use of alcohol, cigarettes, and/or drugs.  Brush your teeth twice a day with fluoride toothpaste, and floss once a day. Good oral hygiene prevents tooth decay and gum disease. The problems can be painful, unattractive, and can cause other health problems. Visit your dentist for a routine oral and dental check up and preventive care every 6-12 months.   Look at your skin regularly.  Use a mirror to look at your back. Notify your caregivers of changes in moles, especially if there are changes in shapes, colors, a size larger than a pencil eraser, an irregular border, or development of new moles.  Safety:  Use seatbelts 100% of the time, whether driving or as a passenger.  Use safety devices such as hearing protection if you work in environments with loud noise or significant background noise.  Use safety glasses when doing any work that could send debris in to the eyes.  Use a helmet if you ride a bike or motorcycle.  Use appropriate safety gear for contact sports.  Talk to your caregiver about gun safety.  Use sunscreen with a SPF (or skin protection factor) of 15 or greater.  Lighter skinned people are at a greater risk of skin cancer. Don't forget to also wear sunglasses in order to protect your eyes from too much damaging sunlight. Damaging sunlight can accelerate cataract formation.   Practice safe sex. Use condoms. Condoms are used for birth control and to help reduce the spread of sexually transmitted infections (or STIs).  Some of the STIs are gonorrhea (the clap), chlamydia, syphilis, trichomonas, herpes, HPV (human papilloma virus) and HIV (human immunodeficiency virus) which causes AIDS. The herpes, HIV and HPV are viral illnesses that have no cure. These can result in disability, cancer and death.   Keep carbon monoxide and smoke detectors in your home functioning at all times. Change the batteries every 6 months or  use a model that plugs into the wall.   Vaccinations:  Stay up to date with your tetanus shots and other required immunizations. You should have a booster for tetanus every 10 years. Be sure to get your flu shot every year, since 5%-20% of the [place] population comes down with the flu. The flu vaccine changes each year, so being vaccinated once is not enough. Get your shot in the fall, before the flu season peaks.   Other vaccines to consider:  Human Papilloma Virus or HPV causes cancer of the cervix, and other infections that can  be transmitted from person to person. There is a vaccine for HPV, and females should get immunized between the ages of 65 and 43. It requires a series of 3 shots.   Pneumococcal vaccine to protect against certain types of pneumonia.  This is normally recommended for adults age 23 or older.  However, adults younger than 74 years old with certain underlying conditions such as diabetes, heart or lung disease should also receive the vaccine.  Shingles vaccine to protect against Varicella Zoster if you are older than age 50, or younger than 74 years old with certain underlying illness.  Hepatitis A vaccine to protect against a form of infection of the liver by a virus acquired from food.  Hepatitis B vaccine to protect against a form of infection of the liver by a virus acquired from blood or body fluids, particularly if you work in health care.  If you plan to travel internationally, check with your local health department for specific vaccination recommendations.  Cancer Screening:  Breast cancer screening is essential to preventive care for women. All women age 69 and older should perform a breast self-exam every month. At age 86 and older, women should have their caregiver complete a breast exam each year. Women at ages 33 and older should have a mammogram (x-ray film) of the breasts. Your caregiver can discuss how often you need mammograms.    Cervical cancer screening  includes taking a Pap smear (sample of cells examined under a microscope) from the cervix (end of the uterus). It also includes testing for HPV (Human Papilloma Virus, which can cause cervical cancer). Screening and a pelvic exam should begin at age 73, or 3 years after a woman becomes sexually active. Screening should occur every year, with a Pap smear but no HPV testing, up to age 42. After age 39, you should have a Pap smear every 3 years with HPV testing, if no HPV was found previously.   Most routine colon cancer screening begins at the age of 59. On a yearly basis, doctors may provide special easy to use take-home tests to check for hidden blood in the stool. Sigmoidoscopy or colonoscopy can detect the earliest forms of colon cancer and is life saving. These tests use a small camera at the end of a tube to directly examine the colon. Speak to your caregiver about this at age 16, when routine screening begins (and is repeated every 5 years unless early forms of pre-cancerous polyps or small growths are found).

## 2014-12-22 LAB — URINALYSIS, ROUTINE W REFLEX MICROSCOPIC
BILIRUBIN URINE: NEGATIVE
Glucose, UA: NEGATIVE mg/dL
HGB URINE DIPSTICK: NEGATIVE
KETONES UR: NEGATIVE mg/dL
Leukocytes, UA: NEGATIVE
Nitrite: NEGATIVE
Protein, ur: NEGATIVE mg/dL
Specific Gravity, Urine: 1.01 (ref 1.005–1.030)
UROBILINOGEN UA: 0.2 mg/dL (ref 0.0–1.0)
pH: 7.5 (ref 5.0–8.0)

## 2014-12-22 LAB — TSH: TSH: 4.623 u[IU]/mL — ABNORMAL HIGH (ref 0.350–4.500)

## 2014-12-22 LAB — URINALYSIS, MICROSCOPIC ONLY
Bacteria, UA: NONE SEEN
CASTS: NONE SEEN
Squamous Epithelial / LPF: NONE SEEN

## 2014-12-22 LAB — HEPATIC FUNCTION PANEL
ALBUMIN: 3.9 g/dL (ref 3.5–5.2)
ALT: 16 U/L (ref 0–35)
AST: 21 U/L (ref 0–37)
Alkaline Phosphatase: 43 U/L (ref 39–117)
Bilirubin, Direct: <0.1 mg/dL (ref 0.0–0.3)
Total Bilirubin: 0.3 mg/dL (ref 0.2–1.2)
Total Protein: 6 g/dL (ref 6.0–8.3)

## 2014-12-22 LAB — CBC WITH DIFFERENTIAL/PLATELET
Basophils Absolute: 0 10*3/uL (ref 0.0–0.1)
Basophils Relative: 0 % (ref 0–1)
EOS ABS: 0.1 10*3/uL (ref 0.0–0.7)
EOS PCT: 1 % (ref 0–5)
HCT: 41.1 % (ref 36.0–46.0)
Hemoglobin: 13.4 g/dL (ref 12.0–15.0)
LYMPHS ABS: 2.1 10*3/uL (ref 0.7–4.0)
LYMPHS PCT: 22 % (ref 12–46)
MCH: 30.7 pg (ref 26.0–34.0)
MCHC: 32.6 g/dL (ref 30.0–36.0)
MCV: 94.3 fL (ref 78.0–100.0)
MONOS PCT: 6 % (ref 3–12)
MPV: 10.7 fL (ref 8.6–12.4)
Monocytes Absolute: 0.6 10*3/uL (ref 0.1–1.0)
Neutro Abs: 6.7 10*3/uL (ref 1.7–7.7)
Neutrophils Relative %: 71 % (ref 43–77)
Platelets: 222 10*3/uL (ref 150–400)
RBC: 4.36 MIL/uL (ref 3.87–5.11)
RDW: 14.7 % (ref 11.5–15.5)
WBC: 9.5 10*3/uL (ref 4.0–10.5)

## 2014-12-22 LAB — LIPID PANEL
CHOL/HDL RATIO: 3 ratio
Cholesterol: 208 mg/dL — ABNORMAL HIGH (ref 0–200)
HDL: 69 mg/dL (ref >39)
LDL Cholesterol: 123 mg/dL — ABNORMAL HIGH (ref 0–99)
TRIGLYCERIDES: 82 mg/dL (ref <150)
VLDL: 16 mg/dL (ref 0–40)

## 2014-12-22 LAB — BASIC METABOLIC PANEL WITH GFR
BUN: 13 mg/dL (ref 6–23)
CALCIUM: 10.3 mg/dL (ref 8.4–10.5)
CO2: 28 mEq/L (ref 19–32)
CREATININE: 0.85 mg/dL (ref 0.50–1.10)
Chloride: 104 mEq/L (ref 96–112)
GFR, Est African American: 79 mL/min
GFR, Est Non African American: 68 mL/min
Glucose, Bld: 73 mg/dL (ref 70–99)
Potassium: 4.2 mEq/L (ref 3.5–5.3)
SODIUM: 141 meq/L (ref 135–145)

## 2014-12-22 LAB — MICROALBUMIN / CREATININE URINE RATIO
CREATININE, URINE: 81.4 mg/dL
Microalb Creat Ratio: 7.4 mg/g (ref 0.0–30.0)
Microalb, Ur: 0.6 mg/dL (ref <2.0)

## 2014-12-22 LAB — IRON AND TIBC
%SAT: 21 % (ref 20–55)
IRON: 69 ug/dL (ref 42–145)
TIBC: 329 ug/dL (ref 250–470)
UIBC: 260 ug/dL (ref 125–400)

## 2014-12-22 LAB — MAGNESIUM: MAGNESIUM: 2.1 mg/dL (ref 1.5–2.5)

## 2014-12-22 LAB — FERRITIN: FERRITIN: 42 ng/mL (ref 10–291)

## 2014-12-22 LAB — VITAMIN D 25 HYDROXY (VIT D DEFICIENCY, FRACTURES): Vit D, 25-Hydroxy: 66 ng/mL (ref 30–100)

## 2014-12-22 LAB — FOLATE RBC: RBC Folate: 1092 ng/mL (ref >280)

## 2014-12-22 LAB — HEMOGLOBIN A1C
HEMOGLOBIN A1C: 5.9 % — AB (ref <5.7)
Mean Plasma Glucose: 123 mg/dL — ABNORMAL HIGH (ref <117)

## 2014-12-22 LAB — INSULIN, FASTING: Insulin fasting, serum: 1.9 u[IU]/mL — ABNORMAL LOW (ref 2.0–19.6)

## 2014-12-22 LAB — VITAMIN B12: Vitamin B-12: 1911 pg/mL — ABNORMAL HIGH (ref 211–911)

## 2014-12-24 DIAGNOSIS — H16142 Punctate keratitis, left eye: Secondary | ICD-10-CM | POA: Diagnosis not present

## 2014-12-24 LAB — ZINC: Zinc: 67 ug/dL (ref 60–130)

## 2014-12-27 ENCOUNTER — Encounter: Payer: Self-pay | Admitting: Physician Assistant

## 2014-12-27 ENCOUNTER — Other Ambulatory Visit: Payer: Self-pay | Admitting: Physician Assistant

## 2014-12-27 MED ORDER — BENZONATATE 100 MG PO CAPS: 100.0000 mg | ORAL_CAPSULE | Freq: Four times a day (QID) | ORAL | Status: DC | PRN

## 2014-12-27 MED ORDER — PREDNISONE 20 MG PO TABS: ORAL_TABLET | ORAL | Status: DC

## 2014-12-27 MED ORDER — AZITHROMYCIN 250 MG PO TABS: ORAL_TABLET | ORAL | Status: DC

## 2015-01-01 ENCOUNTER — Ambulatory Visit
Admission: RE | Admit: 2015-01-01 | Discharge: 2015-01-01 | Disposition: A | Discharge status: Home / Self Care | Payer: Medicare Other | Source: Ambulatory Visit | Attending: Physician Assistant | Admitting: Physician Assistant

## 2015-01-01 DIAGNOSIS — H9311 Tinnitus, right ear: Secondary | ICD-10-CM | POA: Diagnosis not present

## 2015-01-01 DIAGNOSIS — R42 Dizziness and giddiness: Secondary | ICD-10-CM | POA: Diagnosis not present

## 2015-01-01 DIAGNOSIS — H9191 Unspecified hearing loss, right ear: Secondary | ICD-10-CM | POA: Diagnosis not present

## 2015-01-01 DIAGNOSIS — R51 Headache: Secondary | ICD-10-CM | POA: Diagnosis not present

## 2015-02-04 ENCOUNTER — Encounter: Payer: Self-pay | Admitting: Physician Assistant

## 2015-02-04 ENCOUNTER — Ambulatory Visit (INDEPENDENT_AMBULATORY_CARE_PROVIDER_SITE_OTHER): Payer: Medicare Other | Admitting: Physician Assistant

## 2015-02-04 VITALS — BP 140/80 | HR 82 | Temp 99.2°F | Resp 16 | Ht 65.0 in | Wt 110.0 lb

## 2015-02-04 DIAGNOSIS — H65492 Other chronic nonsuppurative otitis media, left ear: Secondary | ICD-10-CM | POA: Diagnosis not present

## 2015-02-04 DIAGNOSIS — M2660 Temporomandibular joint disorder, unspecified: Secondary | ICD-10-CM | POA: Diagnosis not present

## 2015-02-04 DIAGNOSIS — M26609 Unspecified temporomandibular joint disorder, unspecified side: Secondary | ICD-10-CM

## 2015-02-04 DIAGNOSIS — H9202 Otalgia, left ear: Secondary | ICD-10-CM

## 2015-02-04 DIAGNOSIS — J320 Chronic maxillary sinusitis: Secondary | ICD-10-CM | POA: Diagnosis not present

## 2015-02-04 MED ORDER — LEVOFLOXACIN 500 MG PO TABS: 500.0000 mg | ORAL_TABLET | Freq: Every day | ORAL | Status: DC

## 2015-02-04 NOTE — Patient Instructions (Signed)
Your ears and sinuses are connected by the eustachian tube. When your sinuses are inflamed, this can close off the tube and cause fluid to collect in your middle ear. This can then cause dizziness, popping, clicking, ringing, and echoing in your ears. This is often NOT an infection and does NOT require antibiotics, it is caused by inflammation so the treatments help the inflammation. This can take a long time to get better so please be patient.  Here are things you can do to help with this: - Try the Flonase or Nasonex. Remember to spray each nostril twice towards the outer part of your eye.  Do not sniff but instead pinch your nose and tilt your head back to help the medicine get into your sinuses.  The best time to do this is at bedtime.Stop if you get blurred vision or nose bleeds.  -While drinking fluids, pinch and hold nose close and swallow, to help open eustachian tubes to drain fluid behind ear drums. -Please pick one of the over the counter allergy medications below and take it once daily for allergies.  It will also help with fluid behind ear drums. Claritin or loratadine cheapest but likely the weakest  Zyrtec or certizine at night because it can make you sleepy The strongest is allegra or fexafinadine  Cheapest at [employer], [employer], [employer] -can use decongestant over the counter, please do not use if you have high blood pressure or certain heart conditions.   if worsening HA, changes vision/speech, imbalance, weakness go to the ER   What is the TMJ? The temporomandibular (tem-PUH-ro-man-DIB-yoo-ler) joint, or the TMJ, connects the upper and lower jawbones. This joint allows the jaw to open wide and move back and forth when you chew, talk, or yawn.There are also several muscles that help this joint move. There can be muscle tightness and pain in the muscle that can cause several symptoms.  What causes TMJ pain? There are many causes of TMJ pain. Repeated chewing (for example, chewing gum)  and clenching your teeth can cause pain in the joint. Some TMJ pain has no obvious cause. What can I do to ease the pain? There are many things you can do to help your pain get better. When you have pain:  Eat soft foods and stay away from chewy foods (for example, taffy) Try to use both sides of your mouth to chew Don't chew gum Massage Don't open your mouth wide (for example, during yawning or singing) Don't bite your cheeks or fingernails Lower your amount of stress and worry Applying a warm, damp washcloth to the joint may help. Over-the-counter pain medicines such as ibuprofen (one brand: Advil) or acetaminophen (one brand: Tylenol) might also help. Do not use these medicines if you are allergic to them or if your doctor told you not to use them. How can I stop the pain from coming back? When your pain is better, you can do these exercises to make your muscles stronger and to keep the pain from coming back:  Resisted mouth opening: Place your thumb or two fingers under your chin and open your mouth slowly, pushing up lightly on your chin with your thumb. Hold for three to six seconds. Close your mouth slowly. Resisted mouth closing: Place your thumbs under your chin and your two index fingers on the ridge between your mouth and the bottom of your chin. Push down lightly on your chin as you close your mouth. Tongue up: Slowly open and close your mouth while  keeping the tongue touching the roof of the mouth. Side-to-side jaw movement: Place an object about one fourth of an inch thick (for example, two tongue depressors) between your front teeth. Slowly move your jaw from side to side. Increase the thickness of the object as the exercise becomes easier Forward jaw movement: Place an object about one fourth of an inch thick between your front teeth and move the bottom jaw forward so that the bottom teeth are in front of the top teeth. Increase the thickness of the object as the exercise becomes  easier. These exercises should not be painful. If it hurts to do these exercises, stop doing them and talk to your family doctor.

## 2015-02-04 NOTE — Progress Notes (Signed)
Subjective:    Patient ID: Debbie Houston, female    DOB: 12/09/1940, 74 y.o.   MRN: [id number]  HPI 74 y.o. female with history of HTN, preDM, hypothyroid, asthma, chol, allergies presents for left ear discomfort. Has been intermittent for a month but worse this past week. She has decreased hearing, tinnitus, popping/clicking, and has some pain that radiates into her neck with possible enlarged lymph nodes on that side. She states she has been waking up at night sweating, on her shirt and sheets, denies fever, chills. She has had decreased appetite, no change in her weight. She is on nasocort and zyrtec daily. Had MRI brain 01/01/2015 that showed asymmetric fluid in the left mastoid without any masses/stroke. Has been on zpak in Feb for sinusitis. She is up to date on her health maintenance. Has noticed BP has been up to 140/s-rare in 150's last month, denies NSAID use but states + decongestant use last month. No dizziness, HA, changes in vision/speech, SOB, CP.  Wt Readings from Last 3 Encounters:  02/04/15 110 lb (49.896 kg)  12/21/14 110 lb (49.896 kg)  10/27/14 109 lb 8 oz (49.669 kg)   Blood pressure 140/80, pulse 82, temperature 99.2 F (37.3 C), temperature source Temporal, resp. rate 16, height 5' 5"  (1.651 m), weight 110 lb (49.896 kg), SpO2 98 %. BP Readings from Last 3 Encounters:  02/04/15 140/80  12/21/14 128/70  10/27/14 128/67   Review of Systems  Constitutional: Positive for appetite change. Negative for fever, chills, diaphoresis, activity change, fatigue and unexpected weight change.  HENT: Positive for congestion, ear pain, hearing loss, postnasal drip, rhinorrhea, sinus pressure, sneezing and tinnitus. Negative for dental problem, drooling, ear discharge, facial swelling, mouth sores, nosebleeds, sore throat, trouble swallowing and voice change.   Eyes: Negative.   Respiratory: Negative for apnea, cough, choking, chest tightness, shortness of breath, wheezing and stridor.    Cardiovascular: Negative.   Gastrointestinal: Negative.   Genitourinary: Negative.   Musculoskeletal: Negative for neck pain.  Neurological: Negative for headaches.       Objective:   Physical Exam  Constitutional: She is oriented to person, place, and time. She appears well-developed and well-nourished.  HENT:  Head: Normocephalic and atraumatic.  Right Ear: Hearing and external ear normal. No mastoid tenderness. Tympanic membrane is not injected, not erythematous, not retracted and not bulging. No middle ear effusion.  Left Ear: External ear normal. No lacerations. No drainage, swelling or tenderness. No foreign bodies. No mastoid tenderness. Tympanic membrane is retracted. Tympanic membrane is not injected, not scarred, not perforated, not erythematous and not bulging. A middle ear effusion (slightly cloudiness behind TM) is present. No hemotympanum. Decreased hearing is noted.  Nose: Nose normal. Right sinus exhibits no maxillary sinus tenderness and no frontal sinus tenderness. Left sinus exhibits no maxillary sinus tenderness and no frontal sinus tenderness.  Mouth/Throat: Uvula is midline, oropharynx is clear and moist and mucous membranes are normal.  + TMJ tenderness   Eyes: Conjunctivae are normal. Pupils are equal, round, and reactive to light.  Neck: Normal range of motion. Neck supple.  Cardiovascular: Normal rate.   Pulmonary/Chest: Effort normal and breath sounds normal.  Musculoskeletal: Normal range of motion.  Neurological: She is alert and oriented to person, place, and time.  Skin: Skin is warm and dry.      Assessment & Plan:  Left ear effusion/chronic sinusitis- normal neuro, recent MRI, will treat with levaquin 500 mg X 10 days, continue zyrtec and nasocort, taught  autoinflation. If she continues to have issues with effusion, especially with decreased hearing will refer to ENT for evaluation/treatment of effusion.   TMJ- information given to the patient, no  gum/decrease hard foods, warm wet wash clothes, decrease stress, talk with dentist about possible night guard, can do massage, and exercise.   Hypertension- has been higher than usual but admits to taking decongestants daily due to her allergies/sinuses- instructed to stop decongestants, not taking NSAIDS and to continue to monitor BP, if still elevated we can add low dose atenolol at night or ACE.

## 2015-02-21 ENCOUNTER — Other Ambulatory Visit: Payer: Self-pay | Admitting: Internal Medicine

## 2015-02-22 DIAGNOSIS — G514 Facial myokymia: Secondary | ICD-10-CM | POA: Diagnosis not present

## 2015-02-25 DIAGNOSIS — M1712 Unilateral primary osteoarthritis, left knee: Secondary | ICD-10-CM | POA: Diagnosis not present

## 2015-02-25 DIAGNOSIS — M25562 Pain in left knee: Secondary | ICD-10-CM | POA: Diagnosis not present

## 2015-03-14 ENCOUNTER — Ambulatory Visit (INDEPENDENT_AMBULATORY_CARE_PROVIDER_SITE_OTHER): Payer: Medicare Other | Admitting: Internal Medicine

## 2015-03-14 ENCOUNTER — Encounter: Payer: Self-pay | Admitting: Internal Medicine

## 2015-03-14 VITALS — BP 136/84 | HR 74 | Temp 97.8°F | Resp 16 | Ht 65.0 in | Wt 107.0 lb

## 2015-03-14 DIAGNOSIS — M6248 Contracture of muscle, other site: Secondary | ICD-10-CM | POA: Diagnosis not present

## 2015-03-14 DIAGNOSIS — M62838 Other muscle spasm: Secondary | ICD-10-CM

## 2015-03-14 MED ORDER — DIAZEPAM 5 MG PO TABS: 5.0000 mg | ORAL_TABLET | Freq: Four times a day (QID) | ORAL | Status: DC | PRN

## 2015-03-14 NOTE — Patient Instructions (Signed)
Muscle Cramps and Spasms Muscle cramps and spasms are when muscles tighten by themselves. They usually get better within minutes. Muscle cramps are painful. They are usually stronger and last longer than muscle spasms. Muscle spasms may or may not be painful. They can last a few seconds or much longer. HOME CARE  Drink enough fluid to keep your pee (urine) clear or pale yellow.  Massage, stretch, and relax the muscle.  Use a warm towel, heating pad, or warm shower water on tight muscles.  Place ice on the muscle if it is tender or in pain.  Put ice in a plastic bag.  Place a towel between your skin and the bag.  Leave the ice on for 15-20 minutes, 03-04 times a day.  Only take medicine as told by your doctor. GET HELP RIGHT AWAY IF:  Your cramps or spasms get worse, happen more often, or do not get better with time. MAKE SURE YOU:  Understand these instructions.  Will watch your condition.  Will get help right away if you are not doing well or get worse. Document Released: 10/18/2008 Document Revised: 03/02/2013 Document Reviewed: 10/22/2012 [facility] Patient Information 2015 [place], [place]. This information is not intended to replace advice given to you by your health care provider. Make sure you discuss any questions you have with your health care provider.

## 2015-03-14 NOTE — Progress Notes (Signed)
Subjective:    Patient ID: Debbie Houston, female    DOB: 10/29/1941, 74 y.o.   MRN: [id number]  Hypertension Associated symptoms include headaches and neck pain.  Headache  Associated symptoms include back pain, neck pain and numbness. Pertinent negatives include no dizziness or weakness. Her past medical history is significant for hypertension.   Patient is a 74 y.o. Female who presents to the office for evaluation of neck pain, headache, and right sided leg numbness.  She reports that her neck has been bothering her for a couple months.  She reports that it comes and goes and it bothers her significantly more at night time.  She has been using tylenol and that helps sometimes.  She reports that the tylenol does help.  She also has been using heat.  She reports that she has been routinely checking her BP and she felt slightly strange yesterday and said 160/89.  She reports that today it has normalized.  During the elevated blood pressure she had some numbness in her right arm and right leg and also some headache.  She states that her strength was normal and she had no focal weakness.  She does report a history of chronic neck and back issues.     Review of Systems  Constitutional: Negative for chills and fatigue.  Musculoskeletal: Positive for back pain and neck pain. Negative for joint swelling and neck stiffness.  Neurological: Positive for light-headedness, numbness and headaches. Negative for dizziness, facial asymmetry, speech difficulty and weakness.       Objective:   Physical Exam  Constitutional: She is oriented to person, place, and time. She appears well-developed and well-nourished. No distress.  HENT:  Head: Normocephalic and atraumatic.  Mouth/Throat: Oropharynx is clear and moist. No oropharyngeal exudate.  Eyes: Conjunctivae and EOM are normal. Pupils are equal, round, and reactive to light. No scleral icterus.  Neck: Normal range of motion. Neck supple. No JVD present.  No thyromegaly present.  Cardiovascular: Normal rate, regular rhythm, normal heart sounds and intact distal pulses.  Exam reveals no gallop and no friction rub.   No murmur heard. Pulmonary/Chest: Effort normal and breath sounds normal. No respiratory distress. She has no wheezes. She has no rales. She exhibits no tenderness.  Abdominal: Soft. Bowel sounds are normal.  Musculoskeletal: Normal range of motion.  Patient rises slowly from sitting to standing.  They walk without an antalgic gait.  There is no evidence of erythema, ecchymosis, or gross deformity.  There is tenderness to palpation over right sternocleidomastoid.  There is no bony tenderness of the cervical, thoracic, or lumbar bony spine.  Active ROM is full in cervical thoracic and lumbar spine.  Sensation to light touch is intact over all extremities.  Strength is symmetric and equal in all extremities.    Lymphadenopathy:    She has no cervical adenopathy.  Neurological: She is alert and oriented to person, place, and time. She has normal strength. No cranial nerve deficit or sensory deficit. Coordination normal.  Skin: Skin is warm and dry. She is not diaphoretic.  Psychiatric: She has a normal mood and affect. Her behavior is normal. Judgment and thought content normal.  Nursing note and vitals reviewed.   Filed Vitals:   03/14/15 1555  BP: 136/84  Pulse: 74  Temp: 97.8 F (36.6 C)  Resp: 16          Assessment & Plan:  Physical exam without neurological deficits.  Patient has fully normal neurological exam.  Believe that numbness is likely stemming from neck pain.  Think muscle spasm vs. Muscle strain.  There is likely a component of DDD vs arthritis given age.  No bony tenderness so will avoid imaging. Doubt stroke or TIA.  ABCD2 score is low risk.  Will try low dose valium and heat.  If no better than will try prednisone and will send to the orthopedist.    1. Neck muscle spasm  - diazepam (VALIUM) 5 MG tablet;  Take 1 tablet (5 mg total) by mouth every 6 (six) hours as needed for anxiety.  Dispense: 30 tablet; Refill: 0

## 2015-04-02 ENCOUNTER — Encounter: Payer: Self-pay | Admitting: *Deleted

## 2015-04-19 DIAGNOSIS — I83813 Varicose veins of bilateral lower extremities with pain: Secondary | ICD-10-CM | POA: Diagnosis not present

## 2015-05-03 DIAGNOSIS — I83813 Varicose veins of bilateral lower extremities with pain: Secondary | ICD-10-CM | POA: Diagnosis not present

## 2015-05-05 ENCOUNTER — Encounter: Payer: Self-pay | Admitting: Physician Assistant

## 2015-05-05 ENCOUNTER — Ambulatory Visit (INDEPENDENT_AMBULATORY_CARE_PROVIDER_SITE_OTHER): Payer: Medicare Other | Admitting: Physician Assistant

## 2015-05-05 VITALS — BP 138/82 | HR 76 | Temp 97.7°F | Resp 16 | Ht 65.0 in | Wt 110.0 lb

## 2015-05-05 DIAGNOSIS — M8580 Other specified disorders of bone density and structure, unspecified site: Secondary | ICD-10-CM

## 2015-05-05 DIAGNOSIS — R6889 Other general symptoms and signs: Secondary | ICD-10-CM

## 2015-05-05 DIAGNOSIS — E039 Hypothyroidism, unspecified: Secondary | ICD-10-CM | POA: Diagnosis not present

## 2015-05-05 DIAGNOSIS — Z0001 Encounter for general adult medical examination with abnormal findings: Secondary | ICD-10-CM

## 2015-05-05 DIAGNOSIS — Z23 Encounter for immunization: Secondary | ICD-10-CM

## 2015-05-05 DIAGNOSIS — Z681 Body mass index (BMI) 19 or less, adult: Secondary | ICD-10-CM

## 2015-05-05 DIAGNOSIS — K529 Noninfective gastroenteritis and colitis, unspecified: Secondary | ICD-10-CM

## 2015-05-05 DIAGNOSIS — D649 Anemia, unspecified: Secondary | ICD-10-CM

## 2015-05-05 DIAGNOSIS — I10 Essential (primary) hypertension: Secondary | ICD-10-CM | POA: Diagnosis not present

## 2015-05-05 DIAGNOSIS — Z0000 Encounter for general adult medical examination without abnormal findings: Secondary | ICD-10-CM

## 2015-05-05 DIAGNOSIS — R350 Frequency of micturition: Secondary | ICD-10-CM

## 2015-05-05 DIAGNOSIS — T7840XA Allergy, unspecified, initial encounter: Secondary | ICD-10-CM

## 2015-05-05 DIAGNOSIS — Z79899 Other long term (current) drug therapy: Secondary | ICD-10-CM

## 2015-05-05 DIAGNOSIS — M1990 Unspecified osteoarthritis, unspecified site: Secondary | ICD-10-CM

## 2015-05-05 DIAGNOSIS — N952 Postmenopausal atrophic vaginitis: Secondary | ICD-10-CM

## 2015-05-05 DIAGNOSIS — E559 Vitamin D deficiency, unspecified: Secondary | ICD-10-CM

## 2015-05-05 DIAGNOSIS — N898 Other specified noninflammatory disorders of vagina: Secondary | ICD-10-CM

## 2015-05-05 DIAGNOSIS — R7303 Prediabetes: Secondary | ICD-10-CM

## 2015-05-05 DIAGNOSIS — K2100 Gastro-esophageal reflux disease with esophagitis, without bleeding: Secondary | ICD-10-CM

## 2015-05-05 DIAGNOSIS — R7309 Other abnormal glucose: Secondary | ICD-10-CM

## 2015-05-05 DIAGNOSIS — E785 Hyperlipidemia, unspecified: Secondary | ICD-10-CM

## 2015-05-05 DIAGNOSIS — J45909 Unspecified asthma, uncomplicated: Secondary | ICD-10-CM

## 2015-05-05 DIAGNOSIS — Z9181 History of falling: Secondary | ICD-10-CM

## 2015-05-05 LAB — CBC WITH DIFFERENTIAL/PLATELET
BASOS ABS: 0 10*3/uL (ref 0.0–0.1)
BASOS PCT: 0 % (ref 0–1)
EOS ABS: 0.1 10*3/uL (ref 0.0–0.7)
EOS PCT: 1 % (ref 0–5)
HCT: 43.8 % (ref 36.0–46.0)
Hemoglobin: 14.7 g/dL (ref 12.0–15.0)
Lymphocytes Relative: 23 % (ref 12–46)
Lymphs Abs: 1.6 10*3/uL (ref 0.7–4.0)
MCH: 31.6 pg (ref 26.0–34.0)
MCHC: 33.6 g/dL (ref 30.0–36.0)
MCV: 94.2 fL (ref 78.0–100.0)
MONO ABS: 0.4 10*3/uL (ref 0.1–1.0)
MPV: 10.8 fL (ref 8.6–12.4)
Monocytes Relative: 6 % (ref 3–12)
Neutro Abs: 4.8 10*3/uL (ref 1.7–7.7)
Neutrophils Relative %: 70 % (ref 43–77)
PLATELETS: 222 10*3/uL (ref 150–400)
RBC: 4.65 MIL/uL (ref 3.87–5.11)
RDW: 14.5 % (ref 11.5–15.5)
WBC: 6.9 10*3/uL (ref 4.0–10.5)

## 2015-05-05 LAB — HEMOGLOBIN A1C
Hgb A1c MFr Bld: 5.7 % — ABNORMAL HIGH (ref <5.7)
Mean Plasma Glucose: 117 mg/dL — ABNORMAL HIGH (ref <117)

## 2015-05-05 MED ORDER — ESTROGENS, CONJUGATED 0.625 MG/GM VA CREA: 1.0000 | TOPICAL_CREAM | Freq: Every day | VAGINAL | Status: DC

## 2015-05-05 NOTE — Progress Notes (Signed)
Medicare wellness visit and OV Assessment:   1. Essential hypertension - continue medications, DASH diet, exercise and monitor at home. Call if greater than 130/80.  - CBC with Differential/Platelet - BASIC METABOLIC PANEL WITH GFR - Hepatic function panel  2. Hypothyroidism, unspecified hypothyroidism type Hypothyroidism-check TSH level, continue medications the same, reminded to take on an empty stomach 30-39mns before food.  - TSH  3. Hyperlipidemia -continue medications, check lipids, decrease fatty foods, increase activity.  - Lipid panel  4. Prediabetes Discussed general issues about diabetes pathophysiology and management., Educational material distributed., Suggested low cholesterol diet., Encouraged aerobic exercise., Discussed foot care., Reminded to get yearly retinal exam. - Hemoglobin A1c - Insulin, fasting - HM DIABETES FOOT EXAM  5. Asthma, unspecified asthma severity, uncomplicated controlled  6. GERD Continue PPI/H2 blocker, diet discussed  7. Vaginal discharge Normal exam other than severe vaginal atrophy with erythema and irritations- will send off wet prep but likely would benefit from vaginal estrogen cream - WET PREP BY MOLECULAR PROBE  8. Urinary frequency - Urinalysis, [facility] - [facility]  9. Colitis Diet controlled, monitor  10. Medication management - Magnesium  11. Vitamin D deficiency Continue supplement  12. Allergy, initial encounter Continue OTC allergy pills  13. Anemia, unspecified anemia type Anemia - monitor, continue iron supp with Vitamin C and increase green leafy veggies  14. Osteopenia Due DEXA next year, continue Vit D and Ca, weight bearing exercises  15. Osteoarthritis, unspecified osteoarthritis type, unspecified site RICE, NSAIDS, exercises given, if not better get xray and PT referral or ortho referral.    Future Appointments Date Time Provider DLake Holiday  12/28/2015 2:00 PM AVicie Mutters PA-C [facility] None    Plan:   During the course of the visit the patient was educated and counseled about appropriate screening and preventive services including:    Influenza vaccine  Screening electrocardiogram  Screening mammography  Bone densitometry screening  Colorectal cancer screening  Diabetes screening  Glaucoma screening  Nutrition counseling   Conditions/risks identified: BMI: Discussed weight loss, diet, and increase physical activity.  Increase physical activity: AHA recommends 150 minutes of physical activity a week.  Medications reviewed DEXA- Due 09/2014 Urinary Incontinence is not an issue: discussed non pharmacology and pharmacology options.  Fall risk: low- discussed PT, home fall assessment, medications.   Subjective:   Debbie KERRICKis a 74y.o. female who presents for Medicare Annual Wellness Visit and vaginal discharge and pain.  Date of last medicare wellness visit was 02/10/2014  Her blood pressure has been controlled at home, today their BP is BP: 138/82 mmHg She does workout. She denies chest pain, shortness of breath, dizziness.  She is on cholesterol medication, zocor 27mstarted to take every other day and denies myalgias. Her cholesterol is at goal. The cholesterol last visit was:   Lab Results  Component Value Date   CHOL 208* 12/21/2014   HDL 69 12/21/2014   LDLCALC 123* 12/21/2014   TRIG 82 12/21/2014   CHOLHDL 3.0 12/21/2014   She has been working on diet and exercise for prediabetes,  and denies paresthesia of the feet, polydipsia, polyuria and visual disturbances. Last A1C in the office was:  Lab Results  Component Value Date   HGBA1C 5.9* 12/21/2014   Patient is on Vitamin D supplement.   Lab Results  Component Value Date   VD25OH 66 12/21/2014   She follows with Dr. MaWatt Climesshe is on entocort 57m49mas  been taking 3 a day, she has been having some diarrhea, normal colonoscopy 09/2014,  gave him stool samples to check for infection, still pending results.  She also complains of bilateral feet cramping and decreased sensation, sees a vascular doctor and states that it is nerves.  Decrease hearing right ear, with ringing in her ears and some dizziness.  Had pelvic US that showed uterine polyp and endometrial lining thickening, saw Dr Radene Knee and had removal, was benign, and she is now off the tamoxifen.  She was on tamoxifen for osteopenia, off due to endometrial hyperplasia, last DEXA 2015, osteopenia, no change.  She was complaining of right neck pain in July and is now complaining of it again, she has right occipital pain that will radiate to her temporal area. Had normal ESR at that time.  She is on thyroid medication. Her medication was changed last visit, she is on   Lab Results  Component Value Date   TSH 4.623* 12/21/2014   She complains of vaginal discharge and lower AB pain for 1 week, has been having urinary frequency but denies dysuria, fever, chills. Discharge is think but is malodorous. Has some vaginal dryness but it is unchanged.   Names of Other Physician/Practitioners you currently use: 1. [facility]- here for primary care 2. Dr. Sabra Heck, eye doctor, last visit 01/31/2014 3. , dentist, last visit 08/2013 Patient Care Team: Unk Pinto, MD as PCP - General (Internal Medicine)  Medication Review Current Outpatient Prescriptions on File Prior to Visit  Medication Sig Dispense Refill  . aspirin 81 MG tablet Take 81 mg by mouth daily.    Marland Kitchen BIOTIN PO Take 1 tablet by mouth daily.    . budesonide (ENTOCORT EC) 3 MG 24 hr capsule Take 3 mg by mouth 3 (three) times daily as needed.     . Calcium Carb-Cholecalciferol (CALCIUM 600 + D PO) Take 1 tablet by mouth 2 (two) times daily.    . cetirizine (ZYRTEC) 10 MG tablet Take 10 mg by mouth daily.    . Cholecalciferol (VITAMIN D3) 2000 UNITS TABS Take 1 capsule by mouth daily.     . diazepam (VALIUM) 5 MG tablet Take 1 tablet (5 mg total) by mouth every 6 (six) hours as needed for anxiety. 30 tablet 0  . fish oil-omega-3 fatty acids 1000 MG capsule Take 1 g by mouth daily.     . Flaxseed, Linseed, (FLAX SEED OIL) 1000 MG CAPS Take 1,000 mg by mouth daily.    Marland Kitchen levothyroxine (SYNTHROID, LEVOTHROID) 125 MCG tablet Take 1 tablet (125 mcg total) by mouth daily before breakfast. (Patient taking differently: Take 125 mcg by mouth daily before breakfast. ONE TABLET   T/ TH/ SAT--  HALF TABLET  M/W/F/SUN) 90 tablet 1  . Multiple Vitamins-Minerals (MULTIVITAMIN WITH MINERALS) tablet Take 1 tablet by mouth daily.    . Probiotic Product (PROBIOTIC DAILY PO) Take 1 tablet by mouth daily.     . simvastatin (ZOCOR) 20 MG tablet TAKE 1 TABLET BY MOUTH DAILY 90 tablet 1  . triamcinolone (NASACORT) 55 MCG/ACT AERO nasal inhaler Place 2 sprays into the nose at bedtime as needed.    . vitamin B-12 (CYANOCOBALAMIN) 1000 MCG tablet Take 1,000 mcg by mouth daily.    . vitamin C (ASCORBIC ACID) 500 MG tablet Take 500 mg by mouth daily.     No current facility-administered medications on file prior to visit.    Current Problems (verified) Patient Active Problem List  Diagnosis Date Noted  . Colitis 12/21/2014  . Prediabetes 04/20/2014  . Medication management 04/20/2014  . Essential hypertension   . Hyperlipidemia   . Hypothyroid   . GERD   . Asthma   . DJD (degenerative joint disease)   . Vitamin D deficiency   . Osteopenia   . Allergy   . Anemia     Screening Tests Health Maintenance  Topic Date Due  . PNA vac Low Risk Adult (2 of 2 - PCV13) 08/21/2012  . INFLUENZA VACCINE  06/20/2015  . MAMMOGRAM  11/04/2016  . TETANUS/TDAP  08/21/2021  . COLONOSCOPY  09/27/2024  . DEXA SCAN  Completed  . ZOSTAVAX  Completed     Immunization History  Administered Date(s) Administered  . Influenza, High Dose Seasonal PF 07/23/2014  . Pneumococcal Conjugate-13 05/05/2015  .  Pneumococcal-Unspecified 08/22/2011  . Tdap 08/22/2011  . Zoster 06/20/2006    Preventative care: Last colonoscopy: Dr. Magod 09/2014 EGD: 09/2014 Last mammogram: 09/2014 Last pap smear/pelvic exam: 2012 DEXA:09/2014 osteopenia- she is off the tamoxifen 07/2014 US pelvis/AB- + polyp  Prior vaccinations: TD or Tdap: 2012 Influenza: 07/2014 Pneumococcal: 2012 Prevnar 13: DUE will get today Shingles/Zostavax: 2007  Allergies Allergies  Allergen Reactions  . Fosamax [Alendronate Sodium] Other (See Comments)    Gi uPSET  . Singulair [Montelukast Sodium] Other (See Comments)    JITTERS  . Wellbutrin [Bupropion] Hives  . Sulfa Antibiotics Rash   Surgical history Past Surgical History  Procedure Laterality Date  . Mohs surgery  2005    LEFT NASAL BRIDGE FOR BASAL CELL  . Tonsillectomy  age 6  . Dilation and curettage of uterus    . Dilatation & curettage/hysteroscopy with myosure N/A 10/27/2014    Procedure: DILATATION & CURETTAGE/HYSTEROSCOPY WITH MYOSURE;  Surgeon: John S McComb, MD;  Location: [facility];  Service: Gynecology;  Laterality: N/A;   Family history Family History  Problem Relation Age of Onset  . Hypertension Mother   . Cancer Mother     COLON  . Hyperlipidemia Mother   . Cancer Father     BREAST WITH BRAIN METS  . Hyperlipidemia Father   . Cancer Sister     COLON (TEENS), BREAST (50)  . Hyperlipidemia Sister   . Cancer Maternal Grandmother     RENAL  . Hyperlipidemia Maternal Grandmother   . Diabetes Brother   . Hyperlipidemia Brother   . Diabetes Maternal Grandfather   . Stroke Maternal Grandfather   . Hyperlipidemia Maternal Grandfather   . Hyperlipidemia Paternal Grandmother   . Hyperlipidemia Paternal Grandfather     Risk Factors: Osteoporosis: postmenopausal estrogen deficiency and dietary calcium and/or vitamin D deficiency History of fracture in the past year: no  Tobacco History  Substance Use  Topics  . Smoking status: Light Tobacco Smoker -- 15 years    Types: Cigarettes  . Smokeless tobacco: Never Used     Comment: RARELY SMOKES  . Alcohol Use: Yes     Comment: OCCASIONAL   She does not smoke.  Patient is a former smoker. Are there smokers in your home (other than you)?  No  Alcohol Current alcohol use: none  Caffeine Current caffeine use: denies use  Exercise Exercise limitations: The patient has no exercise limitations. Current exercise: aerobics, swimming, walking and yard work  Nutrition/Diet Current diet: in general, a "healthy" diet    Cardiac risk factors: advanced age (older than 55 for men, 65 for women) and hypertension.  Depression Screen    Q1: Over the past two weeks, have you felt down, depressed or hopeless? No  Q2: Over the past two weeks, have you felt little interest or pleasure in doing things? No  Have you lost interest or pleasure in daily life? No  Do you often feel hopeless? No  Do you cry easily over simple problems? No  Activities of Daily Living In your present state of health, do you have any difficulty performing the following activities?:  Driving? No Managing money?  No Feeding yourself? No Getting from bed to chair? No Climbing a flight of stairs? No Preparing food and eating?: No Bathing or showering? No Getting dressed: No Getting to the toilet? No Using the toilet:No Moving around from place to place: No In the past year have you fallen or had a near fall?:No   Are you sexually active?  Yes  Do you have more than one partner?  No  Vision Difficulties: No  Hearing Difficulties: No Do you often ask people to speak up or repeat themselves? No Do you experience ringing or noises in your ears? No Do you have difficulty understanding soft or whispered voices? No  Cognition  Do you feel that you have a problem with memory?No  Do you often misplace items? No  Do you feel safe at home?  Yes  Advanced directives Does  patient have a [place]? Yes Does patient have a Living Will? Yes   Objective:   Blood pressure 138/82, pulse 76, temperature 97.7 F (36.5 C), resp. rate 16, height 5' 5" (1.651 m), weight 110 lb (49.896 kg). Body mass index is 18.31 kg/(m^2).  General appearance: alert, no distress, WD/WN,  female Cognitive Testing  Alert? Yes  Normal Appearance?Yes  Oriented to person? Yes  Place? Yes   Time? Yes  Recall of three objects?  Yes  Can perform simple calculations? Yes  Displays appropriate judgment?Yes  Can read the correct time from a watch face?Yes  HEENT: normocephalic, sclerae anicteric, TMs pearly with mideffusion in left ear, nares patent, no discharge or erythema, pharynx normal, + TMJ tenderness Oral cavity: MMM, no lesions Neck: supple, mild right sided anterior cervical lymphadenopathy, no thyromegaly, no masses Heart: RRR, normal S1, S2, no murmurs Lungs: CTA bilaterally, no wheezes, rhonchi, or rales Abdomen: +bs, soft, flat, non tender, non distended, no masses, no hepatomegaly, no splenomegaly Musculoskeletal: nontender, no swelling, no obvious deformity. Left knee + crepitus, no laxity.  Extremities: no edema, no cyanosis, no clubbing Pulses: 2+ symmetric, upper and lower extremities, normal cap refill Neurological: alert, oriented x 3, CN2-12 intact, strength normal upper extremities and lower extremities, sensation normal throughout, DTRs 2+ throughout, no cerebellar signs, gait normal Psychiatric: normal affect, behavior normal, pleasant  Breast: defer Gyn: VULVA: normal appearing vulva with no masses, tenderness or lesions, VAGINA: atrophic, PELVIC FLOOR EXAM: no cystocele, rectocele or prolapse noted, vaginal erythema. Rectal: defer  Medicare Attestation I have personally reviewed: The patient's medical and social history Their use of alcohol, tobacco or illicit drugs Their current medications and supplements The patient's functional  ability including ADLs,fall risks, home safety risks, cognitive, and hearing and visual impairment Diet and physical activities Evidence for depression or mood disorders  The patient's weight, height, BMI, and visual acuity have been recorded in the chart.  I have made referrals, counseling, and provided education to the patient based on review of the above and I have provided the patient with a written personalized care plan for preventive services.  , , PA-C   05/05/2015   

## 2015-05-05 NOTE — Patient Instructions (Signed)
VAGINAL DRYNESS OVERVIEW  Vaginal dryness, also known as atrophic vaginitis, is a common condition in postmenopausal women. This condition is also common in women who have had both ovaries removed at the time of hysterectomy.   Some women have uncomfortable symptoms of vaginal dryness, such as pain with sex, burning vaginal discomfort or itching, or abnormal vaginal discharge, while others have no symptoms at all.  VAGINAL DRYNESS CAUSES   Estrogen helps to keep the vagina moist and to maintain thickness of the vaginal lining. Vaginal dryness occurs when the ovaries produce a decreased amount of estrogen. This can occur at certain times in a woman's life, and may be permanent or temporary. Times when less estrogen is made include: ?At the time of menopause. ?After surgical removal of the ovaries, chemotherapy, or radiation therapy of the pelvis for cancer. ?After having a baby, particularly in women who breastfeed. ?While using certain medications, such as danazol, medroxyprogesterone (brand names: Provera or DepoProvera), leuprolide (brand name: Lupron), or nafarelin. When these medications are stopped, estrogen production resumes.  Women who smoke cigarettes have been shown to have an increased risk of an earlier menopause transition as compared to non-smokers. Therefore, atrophic vaginitis symptoms may appear at a younger age in this population.  VAGINAL DRYNESS TREATMENT   There are three treatment options for women with vaginal dryness:  Vaginal lubricants and moisturizers - Vaginal lubricants and moisturizers can be purchased without a prescription. These products do not contain any hormones and have virtually no side effects. - Albolene is found in the facial cleanser section at [facility], [facility], or [employer]. It is a large jar with a blue top. This is the best lubricant for women because it is hypoallergenic. -Natural lubricants, such as olive, avocado or peanut oil, are easily available  products that may be used as a lubricant with sex.  -Vaginal moisturizes (eg, Replens, Moist Again, Vagisil, K-Y Silk-E, and Feminease) are formulated to allow water to be retained in the vaginal tissues. Moisturizers are applied into the vagina three times weekly to allow a continued moisturizing effect. These should not be used just before having sex, as they can be irritating.  Vaginal estrogen - Vaginal estrogen is the most effective treatment option for women with vaginal dryness. Vaginal estrogen must be prescribed by a healthcare provider. Very low doses of vaginal estrogen can be used when it is put into the vagina to treat vaginal dryness. A small amount of estrogen is absorbed into the bloodstream, but only about 100 times less than when using estrogen pills or tablets. As a result, there is a much lower risk of side effects, such as blood clots, breast cancer, and heart attack, compared with other estrogen-containing products (birth control pills, menopausal hormone therapy).   Ospemifene - Ospemifene is a prescription medication that is similar to estrogen, but is not estrogen. In the vaginal tissue, it acts similarly to estrogen. In the breast tissue, it acts as an estrogen blocker. It comes in a pill, and is prescribed for women who want to use an estrogen-like medication for vaginal dryness or painful sex associated with vaginal dryness, but prefer not to use a vaginal medication. The medication may cause hot flashes as a side effect. This type of medication may increase the risk of blood clots or uterine cancer. Further study of ospemifene is needed to evaluate the risk of these complications. This medication has not been tested in women who have had breast cancer or are at a high risk of developing  breast cancer.   Preventive Care for Adults A healthy lifestyle and preventive care can promote health and wellness. Preventive health guidelines for women include the following key  practices.  A routine yearly physical is a good way to check with your health care provider about your health and preventive screening. It is a chance to share any concerns and updates on your health and to receive a thorough exam.  Visit your dentist for a routine exam and preventive care every 6 months. Brush your teeth twice a day and floss once a day. Good oral hygiene prevents tooth decay and gum disease.  The frequency of eye exams is based on your age, health, family medical history, use of contact lenses, and other factors. Follow your health care provider's recommendations for frequency of eye exams.  Eat a healthy diet. Foods like vegetables, fruits, whole grains, low-fat dairy products, and lean protein foods contain the nutrients you need without too many calories. Decrease your intake of foods high in solid fats, added sugars, and salt. Eat the right amount of calories for you.Get information about a proper diet from your health care provider, if necessary.  Regular physical exercise is one of the most important things you can do for your health. Most adults should get at least 150 minutes of moderate-intensity exercise (any activity that increases your heart rate and causes you to sweat) each week. In addition, most adults need muscle-strengthening exercises on 2 or more days a week.  Maintain a healthy weight. The body mass index (BMI) is a screening tool to identify possible weight problems. It provides an estimate of body fat based on height and weight. Your health care provider can find your BMI and can help you achieve or maintain a healthy weight.For adults 20 years and older:  A BMI below 18.5 is considered underweight.  A BMI of 18.5 to 24.9 is normal.  A BMI of 25 to 29.9 is considered overweight.  A BMI of 30 and above is considered obese.  Maintain normal blood lipids and cholesterol levels by exercising and minimizing your intake of saturated fat. Eat a balanced diet  with plenty of fruit and vegetables. If your lipid or cholesterol levels are high, you are over 50, or you are at high risk for heart disease, you may need your cholesterol levels checked more frequently.Ongoing high lipid and cholesterol levels should be treated with medicines if diet and exercise are not working.  If you smoke, find out from your health care provider how to quit. If you do not use tobacco, do not start.  Lung cancer screening is recommended for adults aged 53-80 years who are at high risk for developing lung cancer because of a history of smoking. A yearly low-dose CT scan of the lungs is recommended for people who have at least a 30-pack-year history of smoking and are a current smoker or have quit within the past 15 years. A pack year of smoking is smoking an average of 1 pack of cigarettes a day for 1 year (for example: 1 pack a day for 30 years or 2 packs a day for 15 years). Yearly screening should continue until the smoker has stopped smoking for at least 15 years. Yearly screening should be stopped for people who develop a health problem that would prevent them from having lung cancer treatment.  Avoid use of street drugs. Do not share needles with anyone. Ask for help if you need support or instructions about stopping the use  of drugs.  High blood pressure causes heart disease and increases the risk of stroke.  Ongoing high blood pressure should be treated with medicines if weight loss and exercise do not work.  If you are 2-41 years old, ask your health care provider if you should take aspirin to prevent strokes.  Diabetes screening involves taking a blood sample to check your fasting blood sugar level. This should be done once every 3 years, after age 75, if you are within normal weight and without risk factors for diabetes. Testing should be considered at a younger age or be carried out more frequently if you are overweight and have at least 1 risk factor for  diabetes.  Breast cancer screening is essential preventive care for women. You should practice "breast self-awareness." This means understanding the normal appearance and feel of your breasts and may include breast self-examination. Any changes detected, no matter how small, should be reported to a health care provider. Women in their 73s and 30s should have a clinical breast exam (CBE) by a health care provider as part of a regular health exam every 1 to 3 years. After age 73, women should have a CBE every year. Starting at age 67, women should consider having a mammogram (breast X-ray test) every year. Women who have a family history of breast cancer should talk to their health care provider about genetic screening. Women at a high risk of breast cancer should talk to their health care providers about having an MRI and a mammogram every year.  Breast cancer gene (BRCA)-related cancer risk assessment is recommended for women who have family members with BRCA-related cancers. BRCA-related cancers include breast, ovarian, tubal, and peritoneal cancers. Having family members with these cancers may be associated with an increased risk for harmful changes (mutations) in the breast cancer genes BRCA1 and BRCA2. Results of the assessment will determine the need for genetic counseling and BRCA1 and BRCA2 testing.  Routine pelvic exams to screen for cancer are no longer recommended for nonpregnant women who are considered low risk for cancer of the pelvic organs (ovaries, uterus, and vagina) and who do not have symptoms. Ask your health care provider if a screening pelvic exam is right for you.  If you have had past treatment for cervical cancer or a condition that could lead to cancer, you need Pap tests and screening for cancer for at least 20 years after your treatment. If Pap tests have been discontinued, your risk factors (such as having a new sexual partner) need to be reassessed to determine if screening  should be resumed. Some women have medical problems that increase the chance of getting cervical cancer. In these cases, your health care provider may recommend more frequent screening and Pap tests.    Colorectal cancer can be detected and often prevented. Most routine colorectal cancer screening begins at the age of 66 years and continues through age 61 years. However, your health care provider may recommend screening at an earlier age if you have risk factors for colon cancer. On a yearly basis, your health care provider may provide home test kits to check for hidden blood in the stool. Use of a small camera at the end of a tube, to directly examine the colon (sigmoidoscopy or colonoscopy), can detect the earliest forms of colorectal cancer. Talk to your health care provider about this at age 78, when routine screening begins. Direct exam of the colon should be repeated every 5-10 years through age 60 years, unless  early forms of pre-cancerous polyps or small growths are found.  Osteoporosis is a disease in which the bones lose minerals and strength with aging. This can result in serious bone fractures or breaks. The risk of osteoporosis can be identified using a bone density scan. Women ages 61 years and over and women at risk for fractures or osteoporosis should discuss screening with their health care providers. Ask your health care provider whether you should take a calcium supplement or vitamin D to reduce the rate of osteoporosis.  Menopause can be associated with physical symptoms and risks. Hormone replacement therapy is available to decrease symptoms and risks. You should talk to your health care provider about whether hormone replacement therapy is right for you.  Use sunscreen. Apply sunscreen liberally and repeatedly throughout the day. You should seek shade when your shadow is shorter than you. Protect yourself by wearing long sleeves, pants, a wide-brimmed hat, and sunglasses year round,  whenever you are outdoors.  Once a month, do a whole body skin exam, using a mirror to look at the skin on your back. Tell your health care provider of new moles, moles that have irregular borders, moles that are larger than a pencil eraser, or moles that have changed in shape or color.  Stay current with required vaccines (immunizations).  Influenza vaccine. All adults should be immunized every year.  Tetanus, diphtheria, and acellular pertussis (Td, Tdap) vaccine. Pregnant women should receive 1 dose of Tdap vaccine during each pregnancy. The dose should be obtained regardless of the length of time since the last dose. Immunization is preferred during the 27th-36th week of gestation. An adult who has not previously received Tdap or who does not know her vaccine status should receive 1 dose of Tdap. This initial dose should be followed by tetanus and diphtheria toxoids (Td) booster doses every 10 years. Adults with an unknown or incomplete history of completing a 3-dose immunization series with Td-containing vaccines should begin or complete a primary immunization series including a Tdap dose. Adults should receive a Td booster every 10 years.    Zoster vaccine. One dose is recommended for adults aged 30 years or older unless certain conditions are present.    Pneumococcal 13-valent conjugate (PCV13) vaccine. When indicated, a person who is uncertain of her immunization history and has no record of immunization should receive the PCV13 vaccine. An adult aged 66 years or older who has certain medical conditions and has not been previously immunized should receive 1 dose of PCV13 vaccine. This PCV13 should be followed with a dose of pneumococcal polysaccharide (PPSV23) vaccine. The PPSV23 vaccine dose should be obtained at least 8 weeks after the dose of PCV13 vaccine. An adult aged 3 years or older who has certain medical conditions and previously received 1 or more doses of PPSV23 vaccine should  receive 1 dose of PCV13. The PCV13 vaccine dose should be obtained 1 or more years after the last PPSV23 vaccine dose.    Pneumococcal polysaccharide (PPSV23) vaccine. When PCV13 is also indicated, PCV13 should be obtained first. All adults aged 16 years and older should be immunized. An adult younger than age 40 years who has certain medical conditions should be immunized. Any person who resides in a nursing home or long-term care facility should be immunized. An adult smoker should be immunized. People with an immunocompromised condition and certain other conditions should receive both PCV13 and PPSV23 vaccines. People with human immunodeficiency virus (HIV) infection should be immunized as soon as  possible after diagnosis. Immunization during chemotherapy or radiation therapy should be avoided. Routine use of PPSV23 vaccine is not recommended for American Indians, [place]atives, or people younger than 65 years unless there are medical conditions that require PPSV23 vaccine. When indicated, people who have unknown immunization and have no record of immunization should receive PPSV23 vaccine. One-time revaccination 5 years after the first dose of PPSV23 is recommended for people aged 19-64 years who have chronic kidney failure, nephrotic syndrome, asplenia, or immunocompromised conditions. People who received 1-2 doses of PPSV23 before age 79 years should receive another dose of PPSV23 vaccine at age 66 years or later if at least 5 years have passed since the previous dose. Doses of PPSV23 are not needed for people immunized with PPSV23 at or after age 35 years.   Preventive Services / Frequency  Ages 4 years and over  Blood pressure check.  Lipid and cholesterol check.  Lung cancer screening. / Every year if you are aged 83-80 years and have a 30-pack-year history of smoking and currently smoke or have quit within the past 15 years. Yearly screening is stopped once you have quit smoking for at  least 15 years or develop a health problem that would prevent you from having lung cancer treatment.  Clinical breast exam.** / Every year after age 23 years.  BRCA-related cancer risk assessment.** / For women who have family members with a BRCA-related cancer (breast, ovarian, tubal, or peritoneal cancers).  Mammogram.** / Every year beginning at age 96 years and continuing for as long as you are in good health. Consult with your health care provider.  Pap test.** / Every 3 years starting at age 83 years through age 47 or 71 years with 3 consecutive normal Pap tests. Testing can be stopped between 65 and 70 years with 3 consecutive normal Pap tests and no abnormal Pap or HPV tests in the past 10 years.  Fecal occult blood test (FOBT) of stool. / Every year beginning at age 46 years and continuing until age 81 years. You may not need to do this test if you get a colonoscopy every 10 years.  Flexible sigmoidoscopy or colonoscopy.** / Every 5 years for a flexible sigmoidoscopy or every 10 years for a colonoscopy beginning at age 55 years and continuing until age 18 years.  Hepatitis C blood test.** / For all people born from [birth year] through 1965 and any individual with known risks for hepatitis C.  Osteoporosis screening.** / A one-time screening for women ages 31 years and over and women at risk for fractures or osteoporosis.  Skin self-exam. / Monthly.  Influenza vaccine. / Every year.  Tetanus, diphtheria, and acellular pertussis (Tdap/Td) vaccine.** / 1 dose of Td every 10 years.  Zoster vaccine.** / 1 dose for adults aged 45 years or older.  Pneumococcal 13-valent conjugate (PCV13) vaccine.** / Consult your health care provider.  Pneumococcal polysaccharide (PPSV23) vaccine.** / 1 dose for all adults aged 55 years and older. Screening for abdominal aortic aneurysm (AAA)  by ultrasound is recommended for people who have history of high blood pressure or who are current or former  smokers.  What is the TMJ? The temporomandibular (tem-PUH-ro-man-DIB-yoo-ler) joint, or the TMJ, connects the upper and lower jawbones. This joint allows the jaw to open wide and move back and forth when you chew, talk, or yawn.There are also several muscles that help this joint move. There can be muscle tightness and pain in the muscle that can cause several symptoms.  What causes TMJ pain? There are many causes of TMJ pain. Repeated chewing (for example, chewing gum) and clenching your teeth can cause pain in the joint. Some TMJ pain has no obvious cause. What can I do to ease the pain? There are many things you can do to help your pain get better. When you have pain:  Eat soft foods and stay away from chewy foods (for example, taffy) Try to use both sides of your mouth to chew Don't chew gum Massage Don't open your mouth wide (for example, during yawning or singing) Don't bite your cheeks or fingernails Lower your amount of stress and worry Applying a warm, damp washcloth to the joint may help. Over-the-counter pain medicines such as ibuprofen (one brand: Advil) or acetaminophen (one brand: Tylenol) might also help. Do not use these medicines if you are allergic to them or if your doctor told you not to use them. How can I stop the pain from coming back? When your pain is better, you can do these exercises to make your muscles stronger and to keep the pain from coming back:  Resisted mouth opening: Place your thumb or two fingers under your chin and open your mouth slowly, pushing up lightly on your chin with your thumb. Hold for three to six seconds. Close your mouth slowly. Resisted mouth closing: Place your thumbs under your chin and your two index fingers on the ridge between your mouth and the bottom of your chin. Push down lightly on your chin as you close your mouth. Tongue up: Slowly open and close your mouth while keeping the tongue touching the roof of the mouth. Side-to-side jaw  movement: Place an object about one fourth of an inch thick (for example, two tongue depressors) between your front teeth. Slowly move your jaw from side to side. Increase the thickness of the object as the exercise becomes easier Forward jaw movement: Place an object about one fourth of an inch thick between your front teeth and move the bottom jaw forward so that the bottom teeth are in front of the top teeth. Increase the thickness of the object as the exercise becomes easier. These exercises should not be painful. If it hurts to do these exercises, stop doing them and talk to your family doctor.      Sexual activity - Vaginal estrogen improves vaginal dryness quickly, usually within a few weeks. You may continue to have sex as you treat vaginal dryness because sex itself can help to keep the vaginal tissues healthy. Vaginal intercourse may help the vaginal tissues by keeping them soft and stretchable and preventing the tissues from shrinking.  If sex continues to be painful despite treatment for vaginal dryness, talk to your healthcare provider.

## 2015-05-06 LAB — URINALYSIS, ROUTINE W REFLEX MICROSCOPIC
BILIRUBIN URINE: NEGATIVE
Glucose, UA: NEGATIVE mg/dL
HGB URINE DIPSTICK: NEGATIVE
Ketones, ur: NEGATIVE mg/dL
Nitrite: NEGATIVE
Protein, ur: NEGATIVE mg/dL
SPECIFIC GRAVITY, URINE: 1.005 (ref 1.005–1.030)
UROBILINOGEN UA: 0.2 mg/dL (ref 0.0–1.0)
pH: 7 (ref 5.0–8.0)

## 2015-05-06 LAB — LIPID PANEL
Cholesterol: 193 mg/dL (ref 0–200)
HDL: 68 mg/dL (ref >=46)
LDL Cholesterol: 108 mg/dL — ABNORMAL HIGH (ref 0–99)
Total CHOL/HDL Ratio: 2.8 Ratio
Triglycerides: 87 mg/dL (ref <150)
VLDL: 17 mg/dL (ref 0–40)

## 2015-05-06 LAB — BASIC METABOLIC PANEL WITH GFR
BUN: 13 mg/dL (ref 6–23)
CO2: 26 mEq/L (ref 19–32)
CREATININE: 0.79 mg/dL (ref 0.50–1.10)
Calcium: 9.4 mg/dL (ref 8.4–10.5)
Chloride: 101 mEq/L (ref 96–112)
GFR, EST AFRICAN AMERICAN: 86 mL/min
GFR, Est Non African American: 74 mL/min
Glucose, Bld: 85 mg/dL (ref 70–99)
Potassium: 4.7 mEq/L (ref 3.5–5.3)
Sodium: 141 mEq/L (ref 135–145)

## 2015-05-06 LAB — HEPATIC FUNCTION PANEL
ALBUMIN: 4.3 g/dL (ref 3.5–5.2)
ALT: 17 U/L (ref 0–35)
AST: 21 U/L (ref 0–37)
Alkaline Phosphatase: 57 U/L (ref 39–117)
BILIRUBIN INDIRECT: 0.3 mg/dL (ref 0.2–1.2)
Bilirubin, Direct: 0.1 mg/dL (ref 0.0–0.3)
Total Bilirubin: 0.4 mg/dL (ref 0.2–1.2)
Total Protein: 6.5 g/dL (ref 6.0–8.3)

## 2015-05-06 LAB — MAGNESIUM: MAGNESIUM: 1.9 mg/dL (ref 1.5–2.5)

## 2015-05-06 LAB — URINALYSIS, MICROSCOPIC ONLY
BACTERIA UA: NONE SEEN
CASTS: NONE SEEN
CRYSTALS: NONE SEEN
Squamous Epithelial / LPF: NONE SEEN

## 2015-05-06 LAB — TSH: TSH: 1.214 u[IU]/mL (ref 0.350–4.500)

## 2015-05-06 LAB — INSULIN, FASTING: Insulin fasting, serum: 4.3 u[IU]/mL (ref 2.0–19.6)

## 2015-05-06 LAB — WET PREP BY MOLECULAR PROBE
Candida species: NEGATIVE
GARDNERELLA VAGINALIS: NEGATIVE
TRICHOMONAS VAG: NEGATIVE

## 2015-05-06 LAB — URINE CULTURE

## 2015-05-31 ENCOUNTER — Other Ambulatory Visit: Payer: Self-pay | Admitting: Physician Assistant

## 2015-06-16 DIAGNOSIS — L821 Other seborrheic keratosis: Secondary | ICD-10-CM | POA: Diagnosis not present

## 2015-06-16 DIAGNOSIS — D045 Carcinoma in situ of skin of trunk: Secondary | ICD-10-CM | POA: Diagnosis not present

## 2015-06-16 DIAGNOSIS — M25572 Pain in left ankle and joints of left foot: Secondary | ICD-10-CM | POA: Diagnosis not present

## 2015-06-16 DIAGNOSIS — L920 Granuloma annulare: Secondary | ICD-10-CM | POA: Diagnosis not present

## 2015-06-16 DIAGNOSIS — Z85828 Personal history of other malignant neoplasm of skin: Secondary | ICD-10-CM | POA: Diagnosis not present

## 2015-06-16 DIAGNOSIS — D485 Neoplasm of uncertain behavior of skin: Secondary | ICD-10-CM | POA: Diagnosis not present

## 2015-06-16 DIAGNOSIS — M25562 Pain in left knee: Secondary | ICD-10-CM | POA: Diagnosis not present

## 2015-06-28 DIAGNOSIS — D045 Carcinoma in situ of skin of trunk: Secondary | ICD-10-CM | POA: Diagnosis not present

## 2015-06-28 DIAGNOSIS — C44529 Squamous cell carcinoma of skin of other part of trunk: Secondary | ICD-10-CM | POA: Diagnosis not present

## 2015-06-28 DIAGNOSIS — L570 Actinic keratosis: Secondary | ICD-10-CM | POA: Diagnosis not present

## 2015-06-28 DIAGNOSIS — Z85828 Personal history of other malignant neoplasm of skin: Secondary | ICD-10-CM | POA: Diagnosis not present

## 2015-07-07 ENCOUNTER — Ambulatory Visit (INDEPENDENT_AMBULATORY_CARE_PROVIDER_SITE_OTHER): Payer: Medicare Other | Admitting: Physician Assistant

## 2015-07-07 ENCOUNTER — Encounter: Payer: Self-pay | Admitting: Physician Assistant

## 2015-07-07 DIAGNOSIS — Z0000 Encounter for general adult medical examination without abnormal findings: Secondary | ICD-10-CM | POA: Insufficient documentation

## 2015-07-07 DIAGNOSIS — M542 Cervicalgia: Secondary | ICD-10-CM

## 2015-07-07 MED ORDER — PREDNISONE 20 MG PO TABS: ORAL_TABLET | ORAL | Status: AC

## 2015-07-07 NOTE — Patient Instructions (Signed)
Please take the prednisone to help decrease inflammation and therefore decrease symptoms. Take it it with food to avoid GI upset. It can cause increased energy but on the other hand it can make it hard to sleep at night so please take it AT [place], it takes 8-12 hours to start working so it will NOT affect your sleeping if you take it at night with your food!!  If you are diabetic it will increase your sugars so decrease carbs and monitor your sugars closely.    Can take with zantac to prevent heart burn  Cervical Sprain A cervical sprain is an injury in the neck in which the strong, fibrous tissues (ligaments) that connect your neck bones stretch or tear. Cervical sprains can range from mild to severe. Severe cervical sprains can cause the neck vertebrae to be unstable. This can lead to damage of the spinal cord and can result in serious nervous system problems. The amount of time it takes for a cervical sprain to get better depends on the cause and extent of the injury. Most cervical sprains heal in 1 to 3 weeks. CAUSES  Severe cervical sprains may be caused by:   Contact sport injuries (such as from football, rugby, wrestling, hockey, auto racing, gymnastics, diving, martial arts, or boxing).   Motor vehicle collisions.   Whiplash injuries. This is an injury from a sudden forward and backward whipping movement of the head and neck.  Falls.  Mild cervical sprains may be caused by:   Being in an awkward position, such as while cradling a telephone between your ear and shoulder.   Sitting in a chair that does not offer proper support.   Working at a poorly [employer] station.   Looking up or down for long periods of time.  SYMPTOMS   Pain, soreness, stiffness, or a burning sensation in the front, back, or sides of the neck. This discomfort may develop immediately after the injury or slowly, 24 hours or more after the injury.   Pain or tenderness directly in the  middle of the back of the neck.   Shoulder or upper back pain.   Limited ability to move the neck.   Headache.   Dizziness.   Weakness, numbness, or tingling in the hands or arms.   Muscle spasms.   Difficulty swallowing or chewing.   Tenderness and swelling of the neck.  DIAGNOSIS  Most of the time your health care provider can diagnose a cervical sprain by taking your history and doing a physical exam. Your health care provider will ask about previous neck injuries and any known neck problems, such as arthritis in the neck. X-rays may be taken to find out if there are any other problems, such as with the bones of the neck. Other tests, such as a CT scan or MRI, may also be needed.  TREATMENT  Treatment depends on the severity of the cervical sprain. Mild sprains can be treated with rest, keeping the neck in place (immobilization), and pain medicines. Severe cervical sprains are immediately immobilized. Further treatment is done to help with pain, muscle spasms, and other symptoms and may include:  Medicines, such as pain relievers, numbing medicines, or muscle relaxants.   Physical therapy. This may involve stretching exercises, strengthening exercises, and posture training. Exercises and improved posture can help stabilize the neck, strengthen muscles, and help stop symptoms from returning.  HOME CARE INSTRUCTIONS   Put ice on the injured area.   Put ice  in a plastic bag.   Place a towel between your skin and the bag.   Leave the ice on for 15-20 minutes, 3-4 times a day.   If your injury was severe, you may have been given a cervical collar to wear. A cervical collar is a two-piece collar designed to keep your neck from moving while it heals.  Do not remove the collar unless instructed by your health care provider.  If you have long hair, keep it outside of the collar.  Ask your health care provider before making any adjustments to your collar. Minor  adjustments may be required over time to improve comfort and reduce pressure on your chin or on the back of your head.  Ifyou are allowed to remove the collar for cleaning or bathing, follow your health care provider's instructions on how to do so safely.  Keep your collar clean by wiping it with mild soap and water and drying it completely. If the collar you have been given includes removable pads, remove them every 1-2 days and hand wash them with soap and water. Allow them to air dry. They should be completely dry before you wear them in the collar.  If you are allowed to remove the collar for cleaning and bathing, wash and dry the skin of your neck. Check your skin for irritation or sores. If you see any, tell your health care provider.  Do not drive while wearing the collar.   Only take over-the-counter or prescription medicines for pain, discomfort, or fever as directed by your health care provider.   Keep all follow-up appointments as directed by your health care provider.   Keep all physical therapy appointments as directed by your health care provider.   Make any needed adjustments to your workstation to promote good posture.   Avoid positions and activities that make your symptoms worse.   Warm up and stretch before being active to help prevent problems.  SEEK MEDICAL CARE IF:   Your pain is not controlled with medicine.   You are unable to decrease your pain medicine over time as planned.   Your activity level is not improving as expected.  SEEK IMMEDIATE MEDICAL CARE IF:   You develop any bleeding.  You develop stomach upset.  You have signs of an allergic reaction to your medicine.   Your symptoms get worse.   You develop new, unexplained symptoms.   You have numbness, tingling, weakness, or paralysis in any part of your body.  MAKE SURE YOU:   Understand these instructions.  Will watch your condition.  Will get help right away if you are not  doing well or get worse. Document Released: 09/02/2007 Document Revised: 11/10/2013 Document Reviewed: 05/13/2013 [facility] Patient Information 2015 [place], [place]. This information is not intended to replace advice given to you by your health care provider. Make sure you discuss any questions you have with your health care provider.

## 2015-07-07 NOTE — Progress Notes (Signed)
Subjective:    Patient ID: Debbie Houston, female    DOB: 03-04-1941, 74 y.o.   MRN: [id number]  HPI 74 y.o. left handed WF with right sided neck pain. She has had neck pain intermittently for several years but it has gotten worse for past week. She has not injury but has been picking beans. She states it is now interrupting her sleep, getting some tingling/numbness down her right arm. The pain is at her right trap and radiates to her right occipital. No weakness in her right hand, not dropping anything. Valium and heat helped some.  She did have cervical xray 05/2014 that showed DDD at C5-C6.  Blood pressure 128/80, pulse 80, temperature 97.9 F (36.6 C), resp. rate 16, height 5' 5"  (1.651 m), weight 112 lb 3.2 oz (50.894 kg).  Past Medical History  Diagnosis Date  . Hyperlipidemia   . GERD (gastroesophageal reflux disease)   . Arthritis   . DJD (degenerative joint disease)   . Osteopenia   . Vitamin D deficiency   . Colitis   . Benign labile hypertension   . Hypothyroidism   . History of basal cell carcinoma excision   . Prediabetes   . History of esophagitis   . Endometrial polyp   . Wears glasses     Current Outpatient Prescriptions on File Prior to Visit  Medication Sig Dispense Refill  . aspirin 81 MG tablet Take 81 mg by mouth daily.    Marland Kitchen BIOTIN PO Take 1 tablet by mouth daily.    . budesonide (ENTOCORT EC) 3 MG 24 hr capsule Take 3 mg by mouth 3 (three) times daily as needed.     . Calcium Carb-Cholecalciferol (CALCIUM 600 + D PO) Take 1 tablet by mouth 2 (two) times daily.    . cetirizine (ZYRTEC) 10 MG tablet Take 10 mg by mouth daily.    . Cholecalciferol (VITAMIN D3) 2000 UNITS TABS Take 1 capsule by mouth daily.    Marland Kitchen conjugated estrogens (PREMARIN) vaginal cream Place 1 Applicatorful vaginally at bedtime. 42.5 g 12  . diazepam (VALIUM) 5 MG tablet Take 1 tablet (5 mg total) by mouth every 6 (six) hours as needed for anxiety. 30 tablet 0  . fish oil-omega-3 fatty  acids 1000 MG capsule Take 1 g by mouth daily.     . Flaxseed, Linseed, (FLAX SEED OIL) 1000 MG CAPS Take 1,000 mg by mouth daily.    Marland Kitchen levothyroxine (SYNTHROID, LEVOTHROID) 125 MCG tablet TAKE 1 TABLET BY MOUTH DAILY BEFORE BREAKFAST 90 tablet 1  . Multiple Vitamins-Minerals (MULTIVITAMIN WITH MINERALS) tablet Take 1 tablet by mouth daily.    . Probiotic Product (PROBIOTIC DAILY PO) Take 1 tablet by mouth daily.     . simvastatin (ZOCOR) 20 MG tablet TAKE 1 TABLET BY MOUTH DAILY 90 tablet 1  . triamcinolone (NASACORT) 55 MCG/ACT AERO nasal inhaler Place 2 sprays into the nose at bedtime as needed.    . vitamin B-12 (CYANOCOBALAMIN) 1000 MCG tablet Take 1,000 mcg by mouth daily.    . vitamin C (ASCORBIC ACID) 500 MG tablet Take 500 mg by mouth daily.     No current facility-administered medications on file prior to visit.    Review of Systems  Constitutional: Negative.   HENT: Negative.   Respiratory: Negative.   Cardiovascular: Negative.   Gastrointestinal: Negative.   Genitourinary: Negative.   Musculoskeletal: Positive for myalgias and neck pain. Negative for back pain, joint swelling, arthralgias, gait problem and neck stiffness.  Skin: Negative.   Neurological: Negative.   Hematological: Negative.   Psychiatric/Behavioral: Negative.        Objective:   Physical Exam  Constitutional: She is oriented to person, place, and time. She appears well-developed and well-nourished.  HENT:  Head: Normocephalic and atraumatic.  Eyes: Conjunctivae are normal. Pupils are equal, round, and reactive to light.  Neck: Normal range of motion. Neck supple.  Cardiovascular: Normal rate and regular rhythm.   Pulmonary/Chest: Effort normal and breath sounds normal.  Abdominal: Soft. Bowel sounds are normal. There is no tenderness.  Musculoskeletal:  normal range of motion and supple, without spinous process tenderness, with paraspinal muscle tenderness the right side, normal sensation, reflexes,  and pulses distal.  Lymphadenopathy:    She has no cervical adenopathy.  Neurological: She is alert and oriented to person, place, and time. She has normal reflexes.  Skin: Skin is warm and dry. No rash noted.      Assessment & Plan:  Neck pain- likely DDD Prednisone, RICE, continue valium/heat PRN- if not better will refer to ortho.

## 2015-08-23 ENCOUNTER — Ambulatory Visit (INDEPENDENT_AMBULATORY_CARE_PROVIDER_SITE_OTHER): Payer: Medicare Other | Admitting: Internal Medicine

## 2015-08-23 ENCOUNTER — Encounter: Payer: Self-pay | Admitting: Internal Medicine

## 2015-08-23 VITALS — BP 128/70 | HR 78 | Temp 97.8°F | Resp 16 | Ht 65.0 in | Wt 112.0 lb

## 2015-08-23 DIAGNOSIS — J309 Allergic rhinitis, unspecified: Secondary | ICD-10-CM

## 2015-08-23 DIAGNOSIS — Z23 Encounter for immunization: Secondary | ICD-10-CM

## 2015-08-23 DIAGNOSIS — H6592 Unspecified nonsuppurative otitis media, left ear: Secondary | ICD-10-CM

## 2015-08-23 MED ORDER — AZELASTINE HCL 0.1 % NA SOLN: 1.0000 | Freq: Two times a day (BID) | NASAL | Status: DC

## 2015-08-23 MED ORDER — DEXAMETHASONE SODIUM PHOSPHATE 100 MG/10ML IJ SOLN
10.0000 mg | Freq: Once | INTRAMUSCULAR | Status: AC
  Administered 2015-08-23: 10 mg via INTRAMUSCULAR

## 2015-08-23 NOTE — Progress Notes (Signed)
   Subjective:    Patient ID: Debbie Houston, female    DOB: 01/06/1941, 74 y.o.   MRN: [id number]  Otalgia  Associated symptoms include rhinorrhea. Pertinent negatives include no coughing or sore throat.   Patient presents to the office for 1 week sinus congestion and left ear pain.  She rpeorts that she has been having some popping and some fullness in the left ear.  She has been taking nasacort and also zyrtec but seems to continue to have some congestion.  No sick contacts.  She does normally have bad seasonal allergies.     Review of Systems  Constitutional: Negative for fever, chills and fatigue.  HENT: Positive for congestion, ear pain, postnasal drip, rhinorrhea and sinus pressure. Negative for sore throat and voice change.   Respiratory: Negative for cough, chest tightness, shortness of breath and wheezing.        Objective:   Physical Exam  Constitutional: She is oriented to person, place, and time. She appears well-developed and well-nourished. No distress.  HENT:  Head: Normocephalic.  Right Ear: A middle ear effusion is present.  Left Ear: A middle ear effusion is present.  Nose: Mucosal edema present.  Mouth/Throat: Oropharynx is clear and moist. No oropharyngeal exudate.  Eyes: Conjunctivae are normal. No scleral icterus.  Neck: Normal range of motion. Neck supple. No JVD present. No thyromegaly present.  Cardiovascular: Normal rate, regular rhythm, normal heart sounds and intact distal pulses.  Exam reveals no gallop and no friction rub.   No murmur heard. Pulmonary/Chest: Effort normal and breath sounds normal. No respiratory distress. She has no wheezes. She has no rales. She exhibits no tenderness.  Abdominal: Soft. Bowel sounds are normal. She exhibits no distension and no mass. There is no tenderness. There is no rebound and no guarding.  Musculoskeletal: Normal range of motion.  Lymphadenopathy:    She has no cervical adenopathy.  Neurological: She is alert and  oriented to person, place, and time.  Skin: Skin is warm and dry. She is not diaphoretic.  Psychiatric: She has a normal mood and affect. Her behavior is normal. Judgment and thought content normal.  Nursing note and vitals reviewed.   Filed Vitals:   08/23/15 1540  BP: 128/70  Pulse: 78  Temp: 97.8 F (36.6 C)  Resp: 16          Assessment & Plan:    1. Middle ear effusion, left -decadron   2. Allergic rhinitis, unspecified allergic rhinitis type -astelin -nasacort -zyrtec -nasal saline -wear mask for yard work or gardening

## 2015-08-23 NOTE — Patient Instructions (Signed)

## 2015-10-21 ENCOUNTER — Ambulatory Visit (INDEPENDENT_AMBULATORY_CARE_PROVIDER_SITE_OTHER): Payer: Medicare Other | Admitting: Physician Assistant

## 2015-10-21 ENCOUNTER — Encounter: Payer: Self-pay | Admitting: Physician Assistant

## 2015-10-21 VITALS — BP 140/90 | HR 91 | Temp 97.7°F | Resp 16 | Ht 65.0 in | Wt 112.0 lb

## 2015-10-21 DIAGNOSIS — I10 Essential (primary) hypertension: Secondary | ICD-10-CM

## 2015-10-21 DIAGNOSIS — H6592 Unspecified nonsuppurative otitis media, left ear: Secondary | ICD-10-CM | POA: Diagnosis not present

## 2015-10-21 DIAGNOSIS — R350 Frequency of micturition: Secondary | ICD-10-CM | POA: Diagnosis not present

## 2015-10-21 DIAGNOSIS — E039 Hypothyroidism, unspecified: Secondary | ICD-10-CM | POA: Diagnosis not present

## 2015-10-21 LAB — CBC WITH DIFFERENTIAL/PLATELET
BASOS ABS: 0 10*3/uL (ref 0.0–0.1)
Basophils Relative: 0 % (ref 0–1)
EOS PCT: 1 % (ref 0–5)
Eosinophils Absolute: 0.1 10*3/uL (ref 0.0–0.7)
HCT: 42 % (ref 36.0–46.0)
HEMOGLOBIN: 13.9 g/dL (ref 12.0–15.0)
Lymphocytes Relative: 24 % (ref 12–46)
Lymphs Abs: 1.5 10*3/uL (ref 0.7–4.0)
MCH: 31.4 pg (ref 26.0–34.0)
MCHC: 33.1 g/dL (ref 30.0–36.0)
MCV: 94.8 fL (ref 78.0–100.0)
MPV: 10.9 fL (ref 8.6–12.4)
Monocytes Absolute: 0.5 10*3/uL (ref 0.1–1.0)
Monocytes Relative: 8 % (ref 3–12)
NEUTROS ABS: 4.3 10*3/uL (ref 1.7–7.7)
Neutrophils Relative %: 67 % (ref 43–77)
Platelets: 193 10*3/uL (ref 150–400)
RBC: 4.43 MIL/uL (ref 3.87–5.11)
RDW: 14.2 % (ref 11.5–15.5)
WBC: 6.4 10*3/uL (ref 4.0–10.5)

## 2015-10-21 LAB — BASIC METABOLIC PANEL WITH GFR
BUN: 14 mg/dL (ref 7–25)
CALCIUM: 9.6 mg/dL (ref 8.6–10.4)
CO2: 26 mmol/L (ref 20–31)
Chloride: 106 mmol/L (ref 98–110)
Creat: 0.84 mg/dL (ref 0.60–0.93)
GFR, EST AFRICAN AMERICAN: 79 mL/min (ref >=60)
GFR, EST NON AFRICAN AMERICAN: 69 mL/min (ref >=60)
Glucose, Bld: 88 mg/dL (ref 65–99)
POTASSIUM: 4.3 mmol/L (ref 3.5–5.3)
Sodium: 140 mmol/L (ref 135–146)

## 2015-10-21 LAB — HEPATIC FUNCTION PANEL
ALT: 18 U/L (ref 6–29)
AST: 24 U/L (ref 10–35)
Albumin: 4.3 g/dL (ref 3.6–5.1)
Alkaline Phosphatase: 48 U/L (ref 33–130)
Bilirubin, Direct: 0.1 mg/dL (ref <=0.2)
Indirect Bilirubin: 0.4 mg/dL (ref 0.2–1.2)
Total Bilirubin: 0.5 mg/dL (ref 0.2–1.2)
Total Protein: 6.2 g/dL (ref 6.1–8.1)

## 2015-10-21 LAB — TSH: TSH: 4.142 u[IU]/mL (ref 0.350–4.500)

## 2015-10-21 MED ORDER — ATENOLOL 50 MG PO TABS: 50.0000 mg | ORAL_TABLET | Freq: Every day | ORAL | Status: DC

## 2015-10-21 NOTE — Patient Instructions (Signed)
Monitor your blood pressure at home. Go to the ER if any CP, SOB, nausea, dizziness, severe HA, changes vision/speech  Start on 1/2 of the atenolol at night for 1-3 nights, can increase to a whole pill at night.  Stop the B12 Max of 819m of calcium a day  Goal BP:  For patients younger than 60: Goal BP < 140/90. For patients 60 and older: Goal BP < 150/90. For patients with diabetes: Goal BP < 140/90. Your most recent BP: BP: 140/90 mmHg   Take your medications faithfully as instructed. Maintain a healthy weight. Get at least 150 minutes of aerobic exercise per week. Minimize salt intake. Minimize alcohol intake  DASH Eating Plan DASH stands for "Dietary Approaches to Stop Hypertension." The DASH eating plan is a healthy eating plan that has been shown to reduce high blood pressure (hypertension). Additional health benefits may include reducing the risk of type 2 diabetes mellitus, heart disease, and stroke. The DASH eating plan may also help with weight loss. WHAT DO I NEED TO KNOW ABOUT THE DASH EATING PLAN? For the DASH eating plan, you will follow these general guidelines:  Choose foods with a percent daily value for sodium of less than 5% (as listed on the food label).  Use salt-free seasonings or herbs instead of table salt or sea salt.  Check with your health care provider or pharmacist before using salt substitutes.  Eat lower-sodium products, often labeled as "lower sodium" or "no salt added."  Eat fresh foods.  Eat more vegetables, fruits, and low-fat dairy products.  Choose whole grains. Look for the word "whole" as the first word in the ingredient list.  Choose fish and skinless chicken or [place] often than red meat. Limit fish, poultry, and meat to 6 oz (170 g) each day.  Limit sweets, desserts, sugars, and sugary drinks.  Choose heart-healthy fats.  Limit cheese to 1 oz (28 g) per day.  Eat more home-cooked food and less restaurant, buffet, and fast  food.  Limit fried foods.  Cook foods using methods other than frying.  Limit canned vegetables. If you do use them, rinse them well to decrease the sodium.  When eating at a restaurant, ask that your food be prepared with less salt, or no salt if possible. WHAT FOODS CAN I EAT? Seek help from a dietitian for individual calorie needs. Grains Whole grain or whole wheat bread. Brown rice. Whole grain or whole wheat pasta. Quinoa, bulgur, and whole grain cereals. Low-sodium cereals. Corn or whole wheat flour tortillas. Whole grain cornbread. Whole grain crackers. Low-sodium crackers. Vegetables Fresh or frozen vegetables (raw, steamed, roasted, or grilled). Low-sodium or reduced-sodium tomato and vegetable juices. Low-sodium or reduced-sodium tomato sauce and paste. Low-sodium or reduced-sodium canned vegetables.  Fruits All fresh, canned (in natural juice), or frozen fruits. Meat and Other Protein Products Ground beef (85% or leaner), grass-fed beef, or beef trimmed of fat. Skinless chicken or t[place] Ground chicken or t[place] Pork trimmed of fat. All fish and seafood. Eggs. Dried beans, peas, or lentils. Unsalted nuts and seeds. Unsalted canned beans. Dairy Low-fat dairy products, such as skim or 1% milk, 2% or reduced-fat cheeses, low-fat ricotta or cottage cheese, or plain low-fat yogurt. Low-sodium or reduced-sodium cheeses. Fats and Oils Tub margarines without trans fats. Light or reduced-fat mayonnaise and salad dressings (reduced sodium). Avocado. Safflower, olive, or canola oils. Natural peanut or almond butter. Other Unsalted popcorn and pretzels. The items listed above may not be a complete list  of recommended foods or beverages. Contact your dietitian for more options. WHAT FOODS ARE NOT RECOMMENDED? Grains White bread. White pasta. White rice. Refined cornbread. Bagels and croissants. Crackers that contain trans fat. Vegetables Creamed or fried vegetables. Vegetables in a  cheese sauce. Regular canned vegetables. Regular canned tomato sauce and paste. Regular tomato and vegetable juices. Fruits Dried fruits. Canned fruit in light or heavy syrup. Fruit juice. Meat and Other Protein Products Fatty cuts of meat. Ribs, chicken wings, bacon, sausage, bologna, salami, chitterlings, fatback, hot dogs, bratwurst, and packaged luncheon meats. Salted nuts and seeds. Canned beans with salt. Dairy Whole or 2% milk, cream, half-and-half, and cream cheese. Whole-fat or sweetened yogurt. Full-fat cheeses or blue cheese. Nondairy creamers and whipped toppings. Processed cheese, cheese spreads, or cheese curds. Condiments Onion and garlic salt, seasoned salt, table salt, and sea salt. Canned and packaged gravies. Worcestershire sauce. Tartar sauce. Barbecue sauce. Teriyaki sauce. Soy sauce, including reduced sodium. Steak sauce. Fish sauce. Oyster sauce. Cocktail sauce. Horseradish. Ketchup and mustard. Meat flavorings and tenderizers. Bouillon cubes. Hot sauce. Tabasco sauce. Marinades. Taco seasonings. Relishes. Fats and Oils Butter, stick margarine, lard, shortening, ghee, and bacon fat. Coconut, palm kernel, or palm oils. Regular salad dressings. Other Pickles and olives. Salted popcorn and pretzels. The items listed above may not be a complete list of foods and beverages to avoid. Contact your dietitian for more information. WHERE CAN I FIND MORE INFORMATION? National Heart, Lung, and Blood Institute: travelstabloid.com Document Released: 10/25/2011 Document Revised: 03/22/2014 Document Reviewed: 09/09/2013 [facility] Patient Information 2015 [place], [place]. This information is not intended to replace advice given to you by your health care provider. Make sure you discuss any questions you have with your health care provider.  Your ears and sinuses are connected by the eustachian tube. When your sinuses are inflamed, this can close off the tube  and cause fluid to collect in your middle ear. This can then cause dizziness, popping, clicking, ringing, and echoing in your ears. This is often NOT an infection and does NOT require antibiotics, it is caused by inflammation so the treatments help the inflammation. This can take a long time to get better so please be patient.  Here are things you can do to help with this: - Try the Flonase or Nasonex. Remember to spray each nostril twice towards the outer part of your eye.  Do not sniff but instead pinch your nose and tilt your head back to help the medicine get into your sinuses.  The best time to do this is at bedtime.Stop if you get blurred vision or nose bleeds.  -While drinking fluids, pinch and hold nose close and swallow, to help open eustachian tubes to drain fluid behind ear drums. -Please pick one of the over the counter allergy medications below and take it once daily for allergies.  It will also help with fluid behind ear drums. Claritin or loratadine cheapest but likely the weakest  Zyrtec or certizine at night because it can make you sleepy The strongest is allegra or fexafinadine  Cheapest at [facility], [employer], [employer] -can use decongestant over the counter, please do not use if you have high blood pressure or certain heart conditions.   if worsening HA, changes vision/speech, imbalance, weakness go to the ER

## 2015-10-21 NOTE — Progress Notes (Signed)
Subjective:    Patient ID: Debbie Houston, female    DOB: 17-Jun-1941, 74 y.o.   MRN: [id number]  HPI 74 y.o. WF with history of thyroid, chol, HTN, preDM presents with elevated BP. For several weeks she has been feeling shaky, anxious, and tired, not feeling well. She has had some headaches, dizziness. She has some sinus issues with ear pain, sinus congestion, no fever or chills. No changes in speech, vision. Has some nocturia recently that is new.  Blood pressure 140/90, pulse 91, temperature 97.7 F (36.5 C), temperature source Temporal, resp. rate 16, height 5' 5"  (1.651 m), weight 112 lb (50.803 kg), SpO2 98 %.  Past Medical History  Diagnosis Date  . Hyperlipidemia   . GERD (gastroesophageal reflux disease)   . Arthritis   . DJD (degenerative joint disease)   . Osteopenia   . Vitamin D deficiency   . Colitis   . Benign labile hypertension   . Hypothyroidism   . History of basal cell carcinoma excision   . Prediabetes   . History of esophagitis   . Endometrial polyp   . Wears glasses    Current Outpatient Prescriptions on File Prior to Visit  Medication Sig Dispense Refill  . aspirin 81 MG tablet Take 81 mg by mouth daily.    Marland Kitchen BIOTIN PO Take 1 tablet by mouth daily.    . budesonide (ENTOCORT EC) 3 MG 24 hr capsule Take 3 mg by mouth 3 (three) times daily as needed.     . Calcium Carb-Cholecalciferol (CALCIUM 600 + D PO) Take 1 tablet by mouth 2 (two) times daily.    . cetirizine (ZYRTEC) 10 MG tablet Take 10 mg by mouth daily.    . Cholecalciferol (VITAMIN D3) 2000 UNITS TABS Take 1 capsule by mouth daily.    . fish oil-omega-3 fatty acids 1000 MG capsule Take 1 g by mouth daily.     . Flaxseed, Linseed, (FLAX SEED OIL) 1000 MG CAPS Take 1,000 mg by mouth daily.    Marland Kitchen levothyroxine (SYNTHROID, LEVOTHROID) 125 MCG tablet TAKE 1 TABLET BY MOUTH DAILY BEFORE BREAKFAST 90 tablet 1  . Multiple Vitamins-Minerals (MULTIVITAMIN WITH MINERALS) tablet Take 1 tablet by mouth daily.     . Probiotic Product (PROBIOTIC DAILY PO) Take 1 tablet by mouth daily.     . simvastatin (ZOCOR) 20 MG tablet TAKE 1 TABLET BY MOUTH DAILY 90 tablet 1  . vitamin B-12 (CYANOCOBALAMIN) 1000 MCG tablet Take 1,000 mcg by mouth daily.    . vitamin C (ASCORBIC ACID) 500 MG tablet Take 500 mg by mouth daily.     No current facility-administered medications on file prior to visit.   Review of Systems  Constitutional: Positive for fatigue. Negative for fever, chills, diaphoresis, activity change, appetite change and unexpected weight change.  HENT: Positive for congestion, ear pain and postnasal drip. Negative for dental problem, drooling, ear discharge, facial swelling, hearing loss, mouth sores, nosebleeds, rhinorrhea, sinus pressure, sneezing, sore throat, tinnitus, trouble swallowing and voice change.   Respiratory: Negative.  Negative for shortness of breath.   Cardiovascular: Negative.  Negative for chest pain.  Gastrointestinal: Negative.   Genitourinary: Positive for frequency (nocturia). Negative for dysuria, urgency, hematuria, flank pain, decreased urine volume, vaginal bleeding, vaginal discharge, enuresis, difficulty urinating, genital sores, vaginal pain, menstrual problem, pelvic pain and dyspareunia.  Musculoskeletal: Positive for neck pain. Negative for myalgias, back pain, joint swelling, arthralgias, gait problem and neck stiffness.  Skin: Negative.  Negative for  rash.  Neurological: Negative.  Negative for dizziness.  Psychiatric/Behavioral: Negative for suicidal ideas, hallucinations, behavioral problems, confusion, sleep disturbance, self-injury, dysphoric mood, decreased concentration and agitation. The patient is nervous/anxious. The patient is not hyperactive.        Objective:   Physical Exam  Constitutional: She is oriented to person, place, and time. She appears well-developed and well-nourished. No distress.  HENT:  Head: Normocephalic.  Right Ear: A middle ear  effusion is present.  Left Ear: A middle ear effusion is present.  Nose: Mucosal edema present.  Mouth/Throat: Oropharynx is clear and moist. No oropharyngeal exudate.  Eyes: Conjunctivae are normal. No scleral icterus.  Neck: Normal range of motion. Neck supple. No JVD present. No thyromegaly present.  Cardiovascular: Normal rate, regular rhythm, normal heart sounds and intact distal pulses.  Exam reveals no gallop and no friction rub.   No murmur heard. Pulmonary/Chest: Effort normal and breath sounds normal. No respiratory distress. She has no wheezes. She has no rales. She exhibits no tenderness.  Abdominal: Soft. Bowel sounds are normal. She exhibits no distension and no mass. There is no tenderness. There is no rebound and no guarding.  Musculoskeletal: Normal range of motion.  Lymphadenopathy:    She has no cervical adenopathy.  Neurological: She is alert and oriented to person, place, and time.  Skin: Skin is warm and dry. She is not diaphoretic.  Psychiatric: She has a normal mood and affect. Her behavior is normal. Judgment and thought content normal.  Nursing note and vitals reviewed.     Assessment & Plan:  Elevated BP- no CV symptoms, normal neuro Will add on low dose atenolol to take at night Check TSH, urine with nocturia, CBC, BMP, LFTs.  Continue low dose ASA

## 2015-10-22 LAB — URINALYSIS, ROUTINE W REFLEX MICROSCOPIC
Bilirubin Urine: NEGATIVE
Glucose, UA: NEGATIVE
Hgb urine dipstick: NEGATIVE
Ketones, ur: NEGATIVE
NITRITE: NEGATIVE
Protein, ur: NEGATIVE
SPECIFIC GRAVITY, URINE: 1.006 (ref 1.001–1.035)
pH: 7 (ref 5.0–8.0)

## 2015-10-22 LAB — URINALYSIS, MICROSCOPIC ONLY
Bacteria, UA: NONE SEEN [HPF]
CRYSTALS: NONE SEEN [HPF]
Casts: NONE SEEN [LPF]
RBC / HPF: NONE SEEN RBC/HPF (ref <=2)
SQUAMOUS EPITHELIAL / LPF: NONE SEEN [HPF] (ref <=5)
WBC, UA: NONE SEEN WBC/HPF (ref <=5)
YEAST: NONE SEEN [HPF]

## 2015-10-23 LAB — URINE CULTURE
COLONY COUNT: NO GROWTH
ORGANISM ID, BACTERIA: NO GROWTH

## 2015-11-08 ENCOUNTER — Ambulatory Visit (INDEPENDENT_AMBULATORY_CARE_PROVIDER_SITE_OTHER): Payer: Medicare Other | Admitting: Physician Assistant

## 2015-11-08 ENCOUNTER — Encounter: Payer: Self-pay | Admitting: Physician Assistant

## 2015-11-08 VITALS — BP 126/64 | HR 76 | Temp 97.7°F | Resp 14 | Ht 65.0 in | Wt 112.0 lb

## 2015-11-08 DIAGNOSIS — J0100 Acute maxillary sinusitis, unspecified: Secondary | ICD-10-CM | POA: Diagnosis not present

## 2015-11-08 DIAGNOSIS — I10 Essential (primary) hypertension: Secondary | ICD-10-CM | POA: Diagnosis not present

## 2015-11-08 MED ORDER — AZITHROMYCIN 250 MG PO TABS: ORAL_TABLET | ORAL | Status: AC

## 2015-11-08 NOTE — Progress Notes (Signed)
Subjective:    Patient ID: Debbie Houston, female    DOB: [birth year], 74 y.o.   MRN: [id number]  HPI 74 y.o. WF presents for BP check and cold symptoms. She has been on 1/2 of the atenolol at night and states her BP has been controlled at home. She has had cold symptoms x1-2 weeks, has been worse last 2 days. Has been having sinus congestion, green mucus, chills, low grade temperature, cough, no SOB/CP.   Blood pressure 126/64, pulse 76, temperature 97.7 F (36.5 C), temperature source Temporal, resp. rate 14, height 5' 5"  (1.651 m), weight 112 lb (50.803 kg), SpO2 93 %. Current Outpatient Prescriptions on File Prior to Visit  Medication Sig Dispense Refill  . aspirin 81 MG tablet Take 81 mg by mouth daily.    Marland Kitchen atenolol (TENORMIN) 50 MG tablet Take 1 tablet (50 mg total) by mouth at bedtime. 30 tablet 11  . BIOTIN PO Take 1 tablet by mouth daily.    . budesonide (ENTOCORT EC) 3 MG 24 hr capsule Take 3 mg by mouth 3 (three) times daily as needed.     . Calcium Carb-Cholecalciferol (CALCIUM 600 + D PO) Take 1 tablet by mouth 2 (two) times daily.    . cetirizine (ZYRTEC) 10 MG tablet Take 10 mg by mouth daily.    . Cholecalciferol (VITAMIN D3) 2000 UNITS TABS Take 1 capsule by mouth daily.    . fish oil-omega-3 fatty acids 1000 MG capsule Take 1 g by mouth daily.     . Flaxseed, Linseed, (FLAX SEED OIL) 1000 MG CAPS Take 1,000 mg by mouth daily.    Marland Kitchen levothyroxine (SYNTHROID, LEVOTHROID) 125 MCG tablet TAKE 1 TABLET BY MOUTH DAILY BEFORE BREAKFAST 90 tablet 1  . Multiple Vitamins-Minerals (MULTIVITAMIN WITH MINERALS) tablet Take 1 tablet by mouth daily.    . Probiotic Product (PROBIOTIC DAILY PO) Take 1 tablet by mouth daily.     . simvastatin (ZOCOR) 20 MG tablet TAKE 1 TABLET BY MOUTH DAILY 90 tablet 1  . vitamin B-12 (CYANOCOBALAMIN) 1000 MCG tablet Take 1,000 mcg by mouth daily.    . vitamin C (ASCORBIC ACID) 500 MG tablet Take 500 mg by mouth daily.     No current  facility-administered medications on file prior to visit.   Past Medical History  Diagnosis Date  . Hyperlipidemia   . GERD (gastroesophageal reflux disease)   . Arthritis   . DJD (degenerative joint disease)   . Osteopenia   . Vitamin D deficiency   . Colitis   . Benign labile hypertension   . Hypothyroidism   . History of basal cell carcinoma excision   . Prediabetes   . History of esophagitis   . Endometrial polyp   . Wears glasses    Review of Systems  Constitutional: Negative for fever, chills and fatigue.  HENT: Positive for congestion, ear pain, postnasal drip, rhinorrhea and sinus pressure. Negative for sore throat and voice change.   Respiratory: Negative for cough, chest tightness, shortness of breath and wheezing.        Objective:   Physical Exam  Constitutional: She is oriented to person, place, and time. She appears well-developed and well-nourished. No distress.  HENT:  Head: Normocephalic.  Right Ear: A middle ear effusion is present.  Left Ear: A middle ear effusion is present.  Nose: Mucosal edema present. Right sinus exhibits maxillary sinus tenderness. Left sinus exhibits maxillary sinus tenderness.  Mouth/Throat: Oropharynx is clear and moist. No oropharyngeal  exudate.  Eyes: Conjunctivae are normal. No scleral icterus.  Neck: Normal range of motion. Neck supple. No JVD present. No thyromegaly present.  Cardiovascular: Normal rate, regular rhythm, normal heart sounds and intact distal pulses.  Exam reveals no gallop and no friction rub.   No murmur heard. Pulmonary/Chest: Effort normal and breath sounds normal. No respiratory distress. She has no wheezes. She has no rales. She exhibits no tenderness.  Abdominal: Soft. Bowel sounds are normal. She exhibits no distension and no mass. There is no tenderness. There is no rebound and no guarding.  Musculoskeletal: Normal range of motion.  Lymphadenopathy:    She has no cervical adenopathy.  Neurological:  She is alert and oriented to person, place, and time.  Skin: Skin is warm and dry. She is not diaphoretic.  Psychiatric: She has a normal mood and affect. Her behavior is normal. Judgment and thought content normal.  Nursing note and vitals reviewed.     Assessment & Plan:  1. Acute maxillary sinusitis, recurrence not specified Continue zyrtec, nasocort, corocidin, call if not better 7-10 days - azithromycin (ZITHROMAX) 250 MG tablet; Take 2 tablets (500 mg) on  Day 1,  followed by 1 tablet (250 mg) once daily on Days 2 through 5.  Dispense: 6 each; Refill: 1  2. HTN Continue atenolol and bASA, no CV symptoms.   Future Appointments Date Time Provider [place]  12/28/2015 2:00 PM Vicie Mutters, PA-C [id number] None

## 2015-11-08 NOTE — Patient Instructions (Signed)
Sinusitis can be uncomfortable. People with sinusitis have congestion with yellow/green/gray discharge, sinus pain/pressure, pain around the eyes. Sinus infections almost ALWAYS stem from a viral infection and antibiotics don't work against a virus. Even when bacteria is responsible, the infections usually clear up on their own in a week or so.   PLEASE TRY TO DO OVER THE COUNTER TREATMENT AND PREDNISONE FOR 5-7 DAYS AND IF YOU ARE NOT GETTING BETTER OR GETTING WORSE THEN YOU CAN START ON AN ANTIBIOTIC GIVEN.  Can take the prednisone AT NIGHT WITH DINNER, it take 8-12 hours to start working so it will NOT affect your sleeping if you take it at night with your food!! Take two pills the first night and 1 or two pill the second night and then 1 pill the other nights.   Risk of antibiotic use: About 1 in 4 people who take antibiotics have side effects including stomach problems, dizziness, or rashes. Those problems clear up soon after stopping the drugs, but in rare cases antibiotics can cause severe allergic reaction. Over use of antibiotics also encourages the growth of bacteria that can't be controlled easily with drugs. That makes you more vunerable to antibiotic-resistant infections and undermines the benefits of antibiotics for others.   Waste of Money: Antibiotics often aren't very expensive, but any money spent on unnecessary drugs is money down the drain.   When are antibiotics needed? Only when symptoms last longer than a week.  Start to improve but then worsen again  -It can take up to 2 weeks to feel better.   -If you do not get better in 7-10 days (Have fever, facial pain, dental pain and swelling), then please call the office and it is now appropriate to start an antibiotic.   -Please take Tylenol or Ibuprofen for pain. -Acetaminiphen 326m orally every 4-6 hours for pain.  Max: 10 per day -Ibuprofen 2078morally every 6-8 hours for pain.  Take with food to avoid ulcers.   Max 10 per  day  Please pick one of the over the counter allergy medications below and take it once daily for allergies.  Claritin or loratadine cheapest but likely the weakest  Zyrtec or certizine at night because it can make you sleepy The strongest is allegra or fexafinadine  Cheapest at [employer], [employer], [employer]  -While drinking fluids, pinch and hold nose close and swallow.  This will help open up your eustachian tubes to drain the fluid behind your ear drums. -Try steam showers to open your nasal passages.   Drink lots of water to stay hydrated and to thin mucous.  Flonase/Nasonex is to help the inflammation.  Take 2 sprays in each nostril at bedtime.  Make sure you spray towards the outside of each nostril towards the outer corner of your eye, hold nose close and tilt head back.  This will help the medication get into your sinuses.  If you do not like this medication, then use saline nasal sprays same directions as above for Flonase. Stop the medication right away if you get blurring of your vision or nose bleeds.  Sinusitis Sinusitis is redness, soreness, and inflammation of the paranasal sinuses. Paranasal sinuses are air pockets within the bones of your face (beneath the eyes, the middle of the forehead, or above the eyes). In healthy paranasal sinuses, mucus is able to drain out, and air is able to circulate through them by way of your nose. However, when your paranasal sinuses are inflamed, mucus and air can  become trapped. This can allow bacteria and other germs to grow and cause infection. Sinusitis can develop quickly and last only a short time (acute) or continue over a long period (chronic). Sinusitis that lasts for more than 12 weeks is considered chronic.  CAUSES  Causes of sinusitis include: Allergies. Structural abnormalities, such as displacement of the cartilage that separates your nostrils (deviated septum), which can decrease the air flow through your nose and sinuses and affect sinus  drainage. Functional abnormalities, such as when the small hairs (cilia) that line your sinuses and help remove mucus do not work properly or are not present. SIGNS AND SYMPTOMS  Symptoms of acute and chronic sinusitis are the same. The primary symptoms are pain and pressure around the affected sinuses. Other symptoms include: Upper toothache. Earache. Headache. Bad breath. Decreased sense of smell and taste. A cough, which worsens when you are lying flat. Fatigue. Fever. Thick drainage from your nose, which often is green and may contain pus (purulent). Swelling and warmth over the affected sinuses. DIAGNOSIS  Your health care provider will perform a physical exam. During the exam, your health care provider may: Look in your nose for signs of abnormal growths in your nostrils (nasal polyps).  Tap over the affected sinus to check for signs of infection. View the inside of your sinuses (endoscopy) using an imaging device that has a light attached (endoscope). If your health care provider suspects that you have chronic sinusitis, one or more of the following tests may be recommended: Allergy tests. Nasal culture. A sample of mucus is taken from your nose, sent to a lab, and screened for bacteria. Nasal cytology. A sample of mucus is taken from your nose and examined by your health care provider to determine if your sinusitis is related to an allergy. TREATMENT  Most cases of acute sinusitis are related to a viral infection and will resolve on their own within 10 days. Sometimes medicines are prescribed to help relieve symptoms (pain medicine, decongestants, nasal steroid sprays, or saline sprays).  However, for sinusitis related to a bacterial infection, your health care provider will prescribe antibiotic medicines. These are medicines that will help kill the bacteria causing the infection.  Rarely, sinusitis is caused by a fungal infection. In theses cases, your health care provider will  prescribe antifungal medicine. For some cases of chronic sinusitis, surgery is needed. Generally, these are cases in which sinusitis recurs more than 3 times per year, despite other treatments. HOME CARE INSTRUCTIONS  Drink plenty of water. Water helps thin the mucus so your sinuses can drain more easily. Use a humidifier. Inhale steam 3 to 4 times a day (for example, sit in the bathroom with the shower running). Apply a warm, moist washcloth to your face 3 to 4 times a day, or as directed by your health care provider. Use saline nasal sprays to help moisten and clean your sinuses. Take medicines only as directed by your health care provider. If you were prescribed either an antibiotic or antifungal medicine, finish it all even if you start to feel better. SEEK IMMEDIATE MEDICAL CARE IF: You have increasing pain or severe headaches. You have nausea, vomiting, or drowsiness. You have swelling around your face. You have vision problems. You have a stiff neck. You have difficulty breathing. MAKE SURE YOU:  Understand these instructions. Will watch your condition. Will get help right away if you are not doing well or get worse. Document Released: 11/05/2005 Document Revised: 03/22/2014 Document Reviewed: 11/20/2011 [employer]  Patient Information 2015 [place]. This information is not intended to replace advice given to you by your health care provider. Make sure you discuss any questions you have with your health care provider.

## 2015-11-30 DIAGNOSIS — Z1231 Encounter for screening mammogram for malignant neoplasm of breast: Secondary | ICD-10-CM | POA: Diagnosis not present

## 2015-12-01 DIAGNOSIS — I83813 Varicose veins of bilateral lower extremities with pain: Secondary | ICD-10-CM | POA: Diagnosis not present

## 2015-12-06 DIAGNOSIS — I83813 Varicose veins of bilateral lower extremities with pain: Secondary | ICD-10-CM | POA: Diagnosis not present

## 2015-12-08 DIAGNOSIS — I83813 Varicose veins of bilateral lower extremities with pain: Secondary | ICD-10-CM | POA: Diagnosis not present

## 2015-12-08 DIAGNOSIS — I8311 Varicose veins of right lower extremity with inflammation: Secondary | ICD-10-CM | POA: Diagnosis not present

## 2015-12-15 DIAGNOSIS — K210 Gastro-esophageal reflux disease with esophagitis: Secondary | ICD-10-CM | POA: Diagnosis not present

## 2015-12-15 DIAGNOSIS — A09 Infectious gastroenteritis and colitis, unspecified: Secondary | ICD-10-CM | POA: Diagnosis not present

## 2015-12-16 DIAGNOSIS — I83811 Varicose veins of right lower extremities with pain: Secondary | ICD-10-CM | POA: Diagnosis not present

## 2015-12-16 DIAGNOSIS — I8311 Varicose veins of right lower extremity with inflammation: Secondary | ICD-10-CM | POA: Diagnosis not present

## 2015-12-19 DIAGNOSIS — I8311 Varicose veins of right lower extremity with inflammation: Secondary | ICD-10-CM | POA: Diagnosis not present

## 2015-12-19 DIAGNOSIS — I83811 Varicose veins of right lower extremities with pain: Secondary | ICD-10-CM | POA: Diagnosis not present

## 2015-12-28 ENCOUNTER — Other Ambulatory Visit: Payer: Self-pay

## 2015-12-28 ENCOUNTER — Ambulatory Visit (INDEPENDENT_AMBULATORY_CARE_PROVIDER_SITE_OTHER): Payer: Medicare Other | Admitting: Physician Assistant

## 2015-12-28 ENCOUNTER — Encounter: Payer: Self-pay | Admitting: Physician Assistant

## 2015-12-28 VITALS — BP 110/70 | HR 77 | Temp 97.2°F | Resp 16 | Ht 65.5 in | Wt 111.4 lb

## 2015-12-28 DIAGNOSIS — M8580 Other specified disorders of bone density and structure, unspecified site: Secondary | ICD-10-CM | POA: Diagnosis not present

## 2015-12-28 DIAGNOSIS — K2100 Gastro-esophageal reflux disease with esophagitis, without bleeding: Secondary | ICD-10-CM

## 2015-12-28 DIAGNOSIS — M542 Cervicalgia: Secondary | ICD-10-CM

## 2015-12-28 DIAGNOSIS — K529 Noninfective gastroenteritis and colitis, unspecified: Secondary | ICD-10-CM

## 2015-12-28 DIAGNOSIS — E559 Vitamin D deficiency, unspecified: Secondary | ICD-10-CM | POA: Diagnosis not present

## 2015-12-28 DIAGNOSIS — I10 Essential (primary) hypertension: Secondary | ICD-10-CM | POA: Diagnosis not present

## 2015-12-28 DIAGNOSIS — M1990 Unspecified osteoarthritis, unspecified site: Secondary | ICD-10-CM | POA: Diagnosis not present

## 2015-12-28 DIAGNOSIS — J45909 Unspecified asthma, uncomplicated: Secondary | ICD-10-CM

## 2015-12-28 DIAGNOSIS — E039 Hypothyroidism, unspecified: Secondary | ICD-10-CM

## 2015-12-28 DIAGNOSIS — Z79899 Other long term (current) drug therapy: Secondary | ICD-10-CM

## 2015-12-28 DIAGNOSIS — D649 Anemia, unspecified: Secondary | ICD-10-CM | POA: Diagnosis not present

## 2015-12-28 DIAGNOSIS — E785 Hyperlipidemia, unspecified: Secondary | ICD-10-CM

## 2015-12-28 DIAGNOSIS — R7309 Other abnormal glucose: Secondary | ICD-10-CM

## 2015-12-28 DIAGNOSIS — T7840XA Allergy, unspecified, initial encounter: Secondary | ICD-10-CM

## 2015-12-28 LAB — CBC WITH DIFFERENTIAL/PLATELET
Basophils Absolute: 0 K/uL (ref 0.0–0.1)
Basophils Relative: 0 % (ref 0–1)
Eosinophils Absolute: 0.1 K/uL (ref 0.0–0.7)
Eosinophils Relative: 1 % (ref 0–5)
HCT: 41.2 % (ref 36.0–46.0)
Hemoglobin: 13.5 g/dL (ref 12.0–15.0)
Lymphocytes Relative: 19 % (ref 12–46)
Lymphs Abs: 1.6 K/uL (ref 0.7–4.0)
MCH: 31.1 pg (ref 26.0–34.0)
MCHC: 32.8 g/dL (ref 30.0–36.0)
MCV: 94.9 fL (ref 78.0–100.0)
MPV: 11.3 fL (ref 8.6–12.4)
Monocytes Absolute: 0.3 K/uL (ref 0.1–1.0)
Monocytes Relative: 4 % (ref 3–12)
Neutro Abs: 6.4 K/uL (ref 1.7–7.7)
Neutrophils Relative %: 76 % (ref 43–77)
Platelets: 175 K/uL (ref 150–400)
RBC: 4.34 MIL/uL (ref 3.87–5.11)
RDW: 14.9 % (ref 11.5–15.5)
WBC: 8.4 K/uL (ref 4.0–10.5)

## 2015-12-28 MED ORDER — AMITRIPTYLINE HCL 25 MG PO TABS: 25.0000 mg | ORAL_TABLET | Freq: Every day | ORAL | Status: DC

## 2015-12-28 NOTE — Patient Instructions (Signed)
Stop the simvastatin for 2-4 weeks and see if this helps muscle aches/fatigue If not get back on it Can try the amitriptyline, 1/2 pill at night for 3-7 nights, can go up to 2 at night.   Suggest follow up ortho or physical therapy for neck.   Cervical Sprain A cervical sprain is an injury in the neck in which the strong, fibrous tissues (ligaments) that connect your neck bones stretch or tear. Cervical sprains can range from mild to severe. Severe cervical sprains can cause the neck vertebrae to be unstable. This can lead to damage of the spinal cord and can result in serious nervous system problems. The amount of time it takes for a cervical sprain to get better depends on the cause and extent of the injury. Most cervical sprains heal in 1 to 3 weeks. CAUSES  Severe cervical sprains may be caused by:   Contact sport injuries (such as from football, rugby, wrestling, hockey, auto racing, gymnastics, diving, martial arts, or boxing).   Motor vehicle collisions.   Whiplash injuries. This is an injury from a sudden forward and backward whipping movement of the head and neck.  Falls.  Mild cervical sprains may be caused by:   Being in an awkward position, such as while cradling a telephone between your ear and shoulder.   Sitting in a chair that does not offer proper support.   Working at a poorly Landscape [employer] station.   Looking up or down for long periods of time.  SYMPTOMS   Pain, soreness, stiffness, or a burning sensation in the front, back, or sides of the neck. This discomfort may develop immediately after the injury or slowly, 24 hours or more after the injury.   Pain or tenderness directly in the middle of the back of the neck.   Shoulder or upper back pain.   Limited ability to move the neck.   Headache.   Dizziness.   Weakness, numbness, or tingling in the hands or arms.   Muscle spasms.   Difficulty swallowing or chewing.   Tenderness and  swelling of the neck.  DIAGNOSIS  Most of the time your health care provider can diagnose a cervical sprain by taking your history and doing a physical exam. Your health care provider will ask about previous neck injuries and any known neck problems, such as arthritis in the neck. X-rays may be taken to find out if there are any other problems, such as with the bones of the neck. Other tests, such as a CT scan or MRI, may also be needed.  TREATMENT  Treatment depends on the severity of the cervical sprain. Mild sprains can be treated with rest, keeping the neck in place (immobilization), and pain medicines. Severe cervical sprains are immediately immobilized. Further treatment is done to help with pain, muscle spasms, and other symptoms and may include:  Medicines, such as pain relievers, numbing medicines, or muscle relaxants.   Physical therapy. This may involve stretching exercises, strengthening exercises, and posture training. Exercises and improved posture can help stabilize the neck, strengthen muscles, and help stop symptoms from returning.  HOME CARE INSTRUCTIONS   Put ice on the injured area.   Put ice in a plastic bag.   Place a towel between your skin and the bag.   Leave the ice on for 15-20 minutes, 3-4 times a day.   If your injury was severe, you may have been given a cervical collar to wear. A cervical collar is a two-piece  collar designed to keep your neck from moving while it heals.  Do not remove the collar unless instructed by your health care provider.  If you have long hair, keep it outside of the collar.  Ask your health care provider before making any adjustments to your collar. Minor adjustments may be required over time to improve comfort and reduce pressure on your chin or on the back of your head.  Ifyou are allowed to remove the collar for cleaning or bathing, follow your health care provider's instructions on how to do so safely.  Keep your collar  clean by wiping it with mild soap and water and drying it completely. If the collar you have been given includes removable pads, remove them every 1-2 days and hand wash them with soap and water. Allow them to air dry. They should be completely dry before you wear them in the collar.  If you are allowed to remove the collar for cleaning and bathing, wash and dry the skin of your neck. Check your skin for irritation or sores. If you see any, tell your health care provider.  Do not drive while wearing the collar.   Only take over-the-counter or prescription medicines for pain, discomfort, or fever as directed by your health care provider.   Keep all follow-up appointments as directed by your health care provider.   Keep all physical therapy appointments as directed by your health care provider.   Make any needed adjustments to your workstation to promote good posture.   Avoid positions and activities that make your symptoms worse.   Warm up and stretch before being active to help prevent problems.  SEEK MEDICAL CARE IF:   Your pain is not controlled with medicine.   You are unable to decrease your pain medicine over time as planned.   Your activity level is not improving as expected.  SEEK IMMEDIATE MEDICAL CARE IF:   You develop any bleeding.  You develop stomach upset.  You have signs of an allergic reaction to your medicine.   Your symptoms get worse.   You develop new, unexplained symptoms.   You have numbness, tingling, weakness, or paralysis in any part of your body.  MAKE SURE YOU:   Understand these instructions.  Will watch your condition.  Will get help right away if you are not doing well or get worse.   This information is not intended to replace advice given to you by your health care provider. Make sure you discuss any questions you have with your health care provider.   Document Released: 09/02/2007 Document Revised: 11/10/2013 Document  Reviewed: 05/13/2013 Elsevier Interactive Patient Education [employer].

## 2015-12-28 NOTE — Progress Notes (Signed)
Complete Physical  Assessment and Plan: 1. Essential hypertension - CBC with Differential/Platelet - BASIC METABOLIC PANEL WITH GFR - Hepatic function panel - Urinalysis, Routine w reflex microscopic - Microalbumin / creatinine urine ratio - EKG 12-Lead - [place], RETROPERITNL ABD,  LTD  2. Hypothyroidism, unspecified hypothyroidism type - TSH  3. Hyperlipidemia - Lipid panel  4. Prediabetes Discussed general issues about diabetes pathophysiology and management., Educational material distributed., Suggested low cholesterol diet., Encouraged aerobic exercise., Discussed foot care., Reminded to get yearly retinal exam. - Hemoglobin A1c - Insulin, fasting - HM DIABETES FOOT EXAM  5. Asthma, unspecified asthma severity, uncomplicated controlled  6. GERD Continue PPI/H2 blocker, diet discussed  7. Arthritis Treat PRN  8. Osteoarthritis, unspecified osteoarthritis type, unspecified site Aleve PRN  9. Osteopenia Osteopenia- get dexa in DEC, continue Vit D and Ca, weight bearing exercises  10. Vitamin D deficiency - Vit D  25 hydroxy (rtn osteoporosis monitoring)  11. Allergy, initial encounter Continue OTC allergy pills  12. Anemia, unspecified anemia type - Iron and TIBC - Ferritin - Folate RBC  13. Medication management - Magnesium  14. Colitis Continue follow up Dr. Watt [facility]  15. Neck pain Suggest PT or ortho Will try amitriptyline in the mean time.   16 Paraesthesias Stop zocor for 2 weeks Then get back on if not better Start amitriptyline   Discussed med's effects and SE's. Screening labs and tests as requested with regular follow-up as recommended.  HPI  75 y.o. female  presents for a complete physical.  Her blood pressure has been controlled at home, she is on 1/2 of BP med, today their BP is BP: 110/70 mmHg She does workout. She denies chest pain, shortness of breath, dizziness.  She is on cholesterol medication, zocor 84m started to take every other  day and denies myalgias. Her cholesterol is at goal. The cholesterol last visit was:   Lab Results  Component Value Date   CHOL 193 05/05/2015   HDL 68 05/05/2015   LDLCALC 108* 05/05/2015   TRIG 87 05/05/2015   CHOLHDL 2.8 05/05/2015   She has been working on diet and exercise for prediabetes,  and denies paresthesia of the feet, polydipsia, polyuria and visual disturbances. Last A1C in the office was:  Lab Results  Component Value Date   HGBA1C 5.7* 05/05/2015   Patient is on Vitamin D supplement.   Lab Results  Component Value Date   VD25OH 66 12/21/2014     She follows with Dr. MWatt Houston she is on entocort 322mhas been taking 2 a day, she has been having some diarrhea, normal colonoscopy 09/2014. She also has neck pain, right neck, worse when she is at the computer.  She also complains of bilateral feet cramping and decreased sensation.  Decrease hearing right ear, with ringing in her ears and some dizziness.  She was on tamoxifen for osteopenia, off due to endometrial hyperplasia, last DEXA 2015, osteopenia, no change.  She is on thyroid medication. Her medication was not changed last visit.   Lab Results  Component Value Date   TSH 4.142 10/21/2015  .   Current Medications:  Current Outpatient Prescriptions on File Prior to Visit  Medication Sig Dispense Refill  . aspirin 81 MG tablet Take 81 mg by mouth daily.    . Marland Kitchentenolol (TENORMIN) 50 MG tablet Take 1 tablet (50 mg total) by mouth at bedtime. 30 tablet 11  . BIOTIN PO Take 1 tablet by mouth daily.    . budesonide (ENTOCORT  EC) 3 MG 24 hr capsule Take 3 mg by mouth 3 (three) times daily as needed.     . Calcium Carb-Cholecalciferol (CALCIUM 600 + D PO) Take 1 tablet by mouth 2 (two) times daily.    . cetirizine (ZYRTEC) 10 MG tablet Take 10 mg by mouth daily.    . Cholecalciferol (VITAMIN D3) 2000 UNITS TABS Take 1 capsule by mouth daily.    . fish oil-omega-3 fatty acids 1000 MG capsule Take 1 g by mouth daily.     .  Flaxseed, Linseed, (FLAX SEED OIL) 1000 MG CAPS Take 1,000 mg by mouth daily.    Marland Kitchen levothyroxine (SYNTHROID, LEVOTHROID) 125 MCG tablet TAKE 1 TABLET BY MOUTH DAILY BEFORE BREAKFAST 90 tablet 1  . Multiple Vitamins-Minerals (MULTIVITAMIN WITH MINERALS) tablet Take 1 tablet by mouth daily.    . Probiotic Product (PROBIOTIC DAILY PO) Take 1 tablet by mouth daily.     . simvastatin (ZOCOR) 20 MG tablet TAKE 1 TABLET BY MOUTH DAILY 90 tablet 1  . vitamin B-12 (CYANOCOBALAMIN) 1000 MCG tablet Take 1,000 mcg by mouth daily.    . vitamin C (ASCORBIC ACID) 500 MG tablet Take 500 mg by mouth daily.     No current facility-administered medications on file prior to visit.   Health Maintenance:   Immunization History  Administered Date(s) Administered  . Influenza, High Dose Seasonal PF 07/23/2014, 08/23/2015  . Pneumococcal Conjugate-13 05/05/2015  . Pneumococcal-Unspecified 08/22/2011  . Tdap 08/22/2011  . Zoster 06/20/2006   Last colonoscopy: Dr. Watt Houston  09/2014 EGD: 09/2014, scheduled for Nov Last mammogram 3D: 10/2015 at [facility] Last pap smear/pelvic exam: 2012 DEXA:09/2014 osteopenia- she is off the tamoxifen, due Dec  07/2014 US pelvis/AB- + polyp CXR 12/2013 Cervical Xray 05/2014  Prior vaccinations: TD or Tdap: 2012 Influenza: 08/2015 Pneumococcal: 2012 Prevnar 13: 2016 Shingles/Zostavax: 2007  Patient Care Team: Debbie Pinto, MD as PCP - General (Internal Medicine)  Allergies:  Allergies  Allergen Reactions  . Fosamax [Alendronate Sodium] Other (See Comments)    Gi uPSET  . Singulair [Montelukast Sodium] Other (See Comments)    JITTERS  . Wellbutrin [Bupropion] Hives  . Sulfa Antibiotics Rash   Medical History:  Past Medical History  Diagnosis Date  . Hyperlipidemia   . GERD (gastroesophageal reflux disease)   . Arthritis   . DJD (degenerative joint disease)   . Osteopenia   . Vitamin D deficiency   . Colitis   . Benign labile hypertension   .  Hypothyroidism   . History of basal cell carcinoma excision   . Prediabetes   . History of esophagitis   . Endometrial polyp   . Wears glasses    Surgical History:  Past Surgical History  Procedure Laterality Date  . Mohs surgery  2005    LEFT NASAL BRIDGE FOR BASAL CELL  . Tonsillectomy  age 75  . Dilation and curettage of uterus    . Dilatation & curettage/hysteroscopy with myosure N/A 10/27/2014    Procedure: DILATATION & CURETTAGE/HYSTEROSCOPY WITH MYOSURE;  Surgeon: Darlyn Chamber, MD;  Location: [facility];  Service: Gynecology;  Laterality: N/A;   Family History:  Family History  Problem Relation Age of Onset  . Hypertension Mother   . Cancer Mother     COLON  . Hyperlipidemia Mother   . Cancer Father     BREAST WITH BRAIN METS  . Hyperlipidemia Father   . Cancer Sister     COLON (TEENS), BREAST (59)  .  Hyperlipidemia Sister   . Cancer Maternal Grandmother     RENAL  . Hyperlipidemia Maternal Grandmother   . Diabetes Brother   . Hyperlipidemia Brother   . Diabetes Maternal Grandfather   . Stroke Maternal Grandfather   . Hyperlipidemia Maternal Grandfather   . Hyperlipidemia Paternal Grandmother   . Hyperlipidemia Paternal Grandfather    Social History:  Social History  Substance Use Topics  . Smoking status: Light Tobacco Smoker -- 15 years    Types: Cigarettes  . Smokeless tobacco: Never Used     Comment: RARELY SMOKES  . Alcohol Use: Yes     Comment: OCCASIONAL    Review of Systems: Review of Systems  Constitutional: Negative for fever, chills, weight loss, malaise/fatigue and diaphoresis.  HENT: Positive for hearing loss and tinnitus. Negative for congestion, ear discharge, ear pain, nosebleeds and sore throat.   Eyes: Negative.   Respiratory: Negative.  Negative for stridor.   Cardiovascular: Negative.   Gastrointestinal: Positive for abdominal pain and diarrhea. Negative for heartburn, nausea, vomiting, constipation, blood in stool  and melena.  Genitourinary: Negative.   Musculoskeletal: Positive for neck pain. Negative for myalgias, back pain, joint pain and falls.  Skin: Negative.   Neurological: Positive for dizziness and sensory change. Negative for tingling, tremors, speech change, focal weakness, seizures, loss of consciousness, weakness and headaches.  Psychiatric/Behavioral: Negative.     Physical Exam: Estimated body mass index is 18.25 kg/(m^2) as calculated from the following:   Height as of this encounter: 5' 5.5" (1.664 m).   Weight as of this encounter: 111 lb 6.4 oz (50.531 kg). BP 110/70 mmHg  Pulse 77  Temp(Src) 97.2 F (36.2 C)  Resp 16  Ht 5' 5.5" (1.664 m)  Wt 111 lb 6.4 oz (50.531 kg)  BMI 18.25 kg/m2  SpO2 97% General Appearance: Well nourished, in no apparent distress.  Eyes: PERRLA, EOMs, conjunctiva no swelling or erythema, normal fundi and vessels.  Sinuses: No Frontal/maxillary tenderness  ENT/Mouth: Ext aud canals clear, normal light reflex with TMs without erythema, bulging. Good dentition. No erythema, swelling, or exudate on post pharynx. Tonsils not swollen or erythematous. Hearing normal.  Neck: Supple, thyroid normal. No bruits  Respiratory: Respiratory effort normal, BS equal bilaterally without rales, rhonchi, wheezing or stridor.  Cardio: RRR without murmurs, rubs or gallops. Brisk peripheral pulses without edema.  Chest: symmetric, with normal excursions and percussion.  Breasts: Symmetric, without lumps, nipple discharge, retractions.  Abdomen: Soft, nontender, no guarding, rebound, hernias, masses, or organomegaly. .  Lymphatics: Non tender without lymphadenopathy.  Genitourinary:  Musculoskeletal: Full ROM all peripheral extremities,5/5 strength, and normal gait.  Skin: Warm, dry without rashes, lesions, ecchymosis. Neuro: Cranial nerves intact, reflexes equal bilaterally. Normal muscle tone, no cerebellar symptoms. Sensation intact.  Psych: Awake and oriented X 3,  normal affect, Insight and Judgment appropriate.   EKG: WNL no changes. AORTA SCAN: defer  [facility] 1:57 PM [facility]

## 2015-12-29 LAB — URINALYSIS, ROUTINE W REFLEX MICROSCOPIC
Bilirubin Urine: NEGATIVE
Glucose, UA: NEGATIVE
HGB URINE DIPSTICK: NEGATIVE
Ketones, ur: NEGATIVE
NITRITE: NEGATIVE
Protein, ur: NEGATIVE
Specific Gravity, Urine: 1.006 (ref 1.001–1.035)
pH: 6.5 (ref 5.0–8.0)

## 2015-12-29 LAB — HEPATIC FUNCTION PANEL
ALBUMIN: 4.2 g/dL (ref 3.6–5.1)
ALT: 17 U/L (ref 6–29)
AST: 23 U/L (ref 10–35)
Alkaline Phosphatase: 46 U/L (ref 33–130)
BILIRUBIN INDIRECT: 0.3 mg/dL (ref 0.2–1.2)
BILIRUBIN TOTAL: 0.4 mg/dL (ref 0.2–1.2)
Bilirubin, Direct: 0.1 mg/dL (ref <=0.2)
Total Protein: 5.9 g/dL — ABNORMAL LOW (ref 6.1–8.1)

## 2015-12-29 LAB — URINALYSIS, MICROSCOPIC ONLY
BACTERIA UA: NONE SEEN [HPF]
CASTS: NONE SEEN [LPF]
Crystals: NONE SEEN [HPF]
RBC / HPF: NONE SEEN RBC/HPF (ref <=2)
SQUAMOUS EPITHELIAL / LPF: NONE SEEN [HPF] (ref <=5)
WBC, UA: NONE SEEN WBC/HPF (ref <=5)
YEAST: NONE SEEN [HPF]

## 2015-12-29 LAB — IRON AND TIBC
%SAT: 27 % (ref 11–50)
Iron: 91 ug/dL (ref 45–160)
TIBC: 331 ug/dL (ref 250–450)
UIBC: 240 ug/dL (ref 125–400)

## 2015-12-29 LAB — MICROALBUMIN / CREATININE URINE RATIO
CREATININE, URINE: 32 mg/dL (ref 20–320)
MICROALB UR: 0.2 mg/dL
Microalb Creat Ratio: 6 mcg/mg creat (ref <30)

## 2015-12-29 LAB — HEMOGLOBIN A1C
Hgb A1c MFr Bld: 6.1 % — ABNORMAL HIGH (ref <5.7)
Mean Plasma Glucose: 128 mg/dL — ABNORMAL HIGH (ref <117)

## 2015-12-29 LAB — BASIC METABOLIC PANEL WITH GFR
BUN: 15 mg/dL (ref 7–25)
CO2: 27 mmol/L (ref 20–31)
Calcium: 9 mg/dL (ref 8.6–10.4)
Chloride: 106 mmol/L (ref 98–110)
Creat: 0.78 mg/dL (ref 0.60–0.93)
GFR, EST AFRICAN AMERICAN: 87 mL/min (ref >=60)
GFR, EST NON AFRICAN AMERICAN: 75 mL/min (ref >=60)
Glucose, Bld: 83 mg/dL (ref 65–99)
POTASSIUM: 4.1 mmol/L (ref 3.5–5.3)
Sodium: 141 mmol/L (ref 135–146)

## 2015-12-29 LAB — LIPID PANEL
CHOLESTEROL: 175 mg/dL (ref 125–200)
HDL: 65 mg/dL (ref >=46)
LDL Cholesterol: 92 mg/dL (ref <130)
Total CHOL/HDL Ratio: 2.7 Ratio (ref <=5.0)
Triglycerides: 90 mg/dL (ref <150)
VLDL: 18 mg/dL (ref <30)

## 2015-12-29 LAB — FERRITIN: Ferritin: 39 ng/mL (ref 20–288)

## 2015-12-29 LAB — TSH: TSH: 0.76 mIU/L

## 2015-12-29 LAB — MAGNESIUM: MAGNESIUM: 1.9 mg/dL (ref 1.5–2.5)

## 2015-12-29 LAB — INSULIN, FASTING: INSULIN FASTING, SERUM: 8.9 u[IU]/mL (ref 2.0–19.6)

## 2016-01-04 DIAGNOSIS — M542 Cervicalgia: Secondary | ICD-10-CM | POA: Diagnosis not present

## 2016-01-10 DIAGNOSIS — I8311 Varicose veins of right lower extremity with inflammation: Secondary | ICD-10-CM | POA: Diagnosis not present

## 2016-01-10 DIAGNOSIS — I83811 Varicose veins of right lower extremities with pain: Secondary | ICD-10-CM | POA: Diagnosis not present

## 2016-01-11 DIAGNOSIS — M542 Cervicalgia: Secondary | ICD-10-CM | POA: Diagnosis not present

## 2016-01-13 DIAGNOSIS — M542 Cervicalgia: Secondary | ICD-10-CM | POA: Diagnosis not present

## 2016-01-16 DIAGNOSIS — M542 Cervicalgia: Secondary | ICD-10-CM | POA: Diagnosis not present

## 2016-01-19 DIAGNOSIS — M542 Cervicalgia: Secondary | ICD-10-CM | POA: Diagnosis not present

## 2016-01-23 DIAGNOSIS — M542 Cervicalgia: Secondary | ICD-10-CM | POA: Diagnosis not present

## 2016-01-25 DIAGNOSIS — M542 Cervicalgia: Secondary | ICD-10-CM | POA: Diagnosis not present

## 2016-01-30 ENCOUNTER — Other Ambulatory Visit: Payer: Self-pay | Admitting: Physician Assistant

## 2016-01-30 ENCOUNTER — Other Ambulatory Visit: Payer: Self-pay | Admitting: Internal Medicine

## 2016-01-30 DIAGNOSIS — I8311 Varicose veins of right lower extremity with inflammation: Secondary | ICD-10-CM | POA: Diagnosis not present

## 2016-01-31 DIAGNOSIS — M542 Cervicalgia: Secondary | ICD-10-CM | POA: Diagnosis not present

## 2016-02-02 DIAGNOSIS — M542 Cervicalgia: Secondary | ICD-10-CM | POA: Diagnosis not present

## 2016-02-07 DIAGNOSIS — A09 Infectious gastroenteritis and colitis, unspecified: Secondary | ICD-10-CM | POA: Diagnosis not present

## 2016-02-07 DIAGNOSIS — K210 Gastro-esophageal reflux disease with esophagitis: Secondary | ICD-10-CM | POA: Diagnosis not present

## 2016-02-08 DIAGNOSIS — M542 Cervicalgia: Secondary | ICD-10-CM | POA: Diagnosis not present

## 2016-02-10 DIAGNOSIS — M542 Cervicalgia: Secondary | ICD-10-CM | POA: Diagnosis not present

## 2016-02-11 ENCOUNTER — Encounter: Payer: Self-pay | Admitting: *Deleted

## 2016-02-13 DIAGNOSIS — M542 Cervicalgia: Secondary | ICD-10-CM | POA: Diagnosis not present

## 2016-02-21 DIAGNOSIS — H52223 Regular astigmatism, bilateral: Secondary | ICD-10-CM | POA: Diagnosis not present

## 2016-02-21 DIAGNOSIS — M542 Cervicalgia: Secondary | ICD-10-CM | POA: Diagnosis not present

## 2016-02-21 DIAGNOSIS — H43393 Other vitreous opacities, bilateral: Secondary | ICD-10-CM | POA: Diagnosis not present

## 2016-02-21 DIAGNOSIS — H2513 Age-related nuclear cataract, bilateral: Secondary | ICD-10-CM | POA: Diagnosis not present

## 2016-02-21 DIAGNOSIS — H5202 Hypermetropia, left eye: Secondary | ICD-10-CM | POA: Diagnosis not present

## 2016-02-21 DIAGNOSIS — H26052 Posterior subcapsular polar infantile and juvenile cataract, left eye: Secondary | ICD-10-CM | POA: Diagnosis not present

## 2016-02-21 DIAGNOSIS — H5211 Myopia, right eye: Secondary | ICD-10-CM | POA: Diagnosis not present

## 2016-02-21 DIAGNOSIS — H524 Presbyopia: Secondary | ICD-10-CM | POA: Diagnosis not present

## 2016-02-23 DIAGNOSIS — M542 Cervicalgia: Secondary | ICD-10-CM | POA: Diagnosis not present

## 2016-02-28 DIAGNOSIS — M542 Cervicalgia: Secondary | ICD-10-CM | POA: Diagnosis not present

## 2016-04-04 ENCOUNTER — Ambulatory Visit (INDEPENDENT_AMBULATORY_CARE_PROVIDER_SITE_OTHER): Payer: Medicare Other | Admitting: Internal Medicine

## 2016-04-04 ENCOUNTER — Encounter: Payer: Self-pay | Admitting: Internal Medicine

## 2016-04-04 VITALS — BP 112/78 | HR 72 | Temp 97.3°F | Resp 16 | Ht 65.5 in | Wt 109.6 lb

## 2016-04-04 DIAGNOSIS — R7303 Prediabetes: Secondary | ICD-10-CM | POA: Diagnosis not present

## 2016-04-04 DIAGNOSIS — E559 Vitamin D deficiency, unspecified: Secondary | ICD-10-CM

## 2016-04-04 DIAGNOSIS — E039 Hypothyroidism, unspecified: Secondary | ICD-10-CM | POA: Diagnosis not present

## 2016-04-04 DIAGNOSIS — R7309 Other abnormal glucose: Secondary | ICD-10-CM | POA: Diagnosis not present

## 2016-04-04 DIAGNOSIS — E785 Hyperlipidemia, unspecified: Secondary | ICD-10-CM | POA: Diagnosis not present

## 2016-04-04 DIAGNOSIS — Z79899 Other long term (current) drug therapy: Secondary | ICD-10-CM | POA: Diagnosis not present

## 2016-04-04 DIAGNOSIS — I10 Essential (primary) hypertension: Secondary | ICD-10-CM | POA: Diagnosis not present

## 2016-04-04 LAB — BASIC METABOLIC PANEL WITH GFR
BUN: 13 mg/dL (ref 7–25)
CALCIUM: 9.3 mg/dL (ref 8.6–10.4)
CO2: 25 mmol/L (ref 20–31)
CREATININE: 0.86 mg/dL (ref 0.60–0.93)
Chloride: 106 mmol/L (ref 98–110)
GFR, EST AFRICAN AMERICAN: 77 mL/min (ref >=60)
GFR, Est Non African American: 67 mL/min (ref >=60)
Glucose, Bld: 85 mg/dL (ref 65–99)
POTASSIUM: 4.6 mmol/L (ref 3.5–5.3)
Sodium: 139 mmol/L (ref 135–146)

## 2016-04-04 LAB — CBC WITH DIFFERENTIAL/PLATELET
BASOS PCT: 1 %
Basophils Absolute: 61 cells/uL (ref 0–200)
EOS ABS: 122 {cells}/uL (ref 15–500)
Eosinophils Relative: 2 %
HCT: 40.8 % (ref 35.0–45.0)
Hemoglobin: 13.3 g/dL (ref 11.7–15.5)
LYMPHS PCT: 27 %
Lymphs Abs: 1647 cells/uL (ref 850–3900)
MCH: 31 pg (ref 27.0–33.0)
MCHC: 32.6 g/dL (ref 32.0–36.0)
MCV: 95.1 fL (ref 80.0–100.0)
MONOS PCT: 9 %
MPV: 11.6 fL (ref 7.5–12.5)
Monocytes Absolute: 549 cells/uL (ref 200–950)
NEUTROS PCT: 61 %
Neutro Abs: 3721 cells/uL (ref 1500–7800)
PLATELETS: 157 10*3/uL (ref 140–400)
RBC: 4.29 MIL/uL (ref 3.80–5.10)
RDW: 15.2 % — AB (ref 11.0–15.0)
WBC: 6.1 10*3/uL (ref 3.8–10.8)

## 2016-04-04 LAB — LIPID PANEL
CHOL/HDL RATIO: 2.6 ratio (ref <=5.0)
Cholesterol: 166 mg/dL (ref 125–200)
HDL: 64 mg/dL (ref >=46)
LDL CALC: 89 mg/dL (ref <130)
Triglycerides: 63 mg/dL (ref <150)
VLDL: 13 mg/dL (ref <30)

## 2016-04-04 LAB — HEPATIC FUNCTION PANEL
ALBUMIN: 4.2 g/dL (ref 3.6–5.1)
ALT: 17 U/L (ref 6–29)
AST: 20 U/L (ref 10–35)
Alkaline Phosphatase: 48 U/L (ref 33–130)
BILIRUBIN TOTAL: 0.4 mg/dL (ref 0.2–1.2)
Bilirubin, Direct: 0.1 mg/dL (ref <=0.2)
Indirect Bilirubin: 0.3 mg/dL (ref 0.2–1.2)
TOTAL PROTEIN: 6 g/dL — AB (ref 6.1–8.1)

## 2016-04-04 LAB — MAGNESIUM: MAGNESIUM: 2 mg/dL (ref 1.5–2.5)

## 2016-04-04 LAB — TSH: TSH: 1.36 m[IU]/L

## 2016-04-04 NOTE — Patient Instructions (Signed)

## 2016-04-04 NOTE — Progress Notes (Signed)
Patient ID: [facility], female   DOB: July 07, 1941, 75 y.o.   MRN: [id number]  [facility]                       Unk Pinto, M.D.        Uvaldo Bristle. Silverio Lay, P.A.-C       Starlyn Skeans, P.A.-C   [facility]                [address] [place] [address] Telephone [phone] Telefax [phone] _________________________________________________________________________     This very nice 75 y.o. MWF presents for follow up with Hypertension, Hyperlipidemia, Pre-Diabetes and Vitamin D Deficiency. Patient is also c/o numbness of her soles of her feet. Also c/o sore spot behind the Rt. ear.      Patient is treated for HTN circa 2015 & BP has been controlled at home. Today's BP: 112/78 mmHg. Patient has had no complaints of any cardiac type chest pain, palpitations, dyspnea/orthopnea/PND, dizziness, claudication, or dependent edema.     Hyperlipidemia is controlled with diet & meds. Patient denies myalgias or other med SE's. Last Lipids were 12/28/2015: Cholesterol 175; HDL 65; LDL 92; Triglycerides 90 on      Also, the patient has history of PreDiabetes with A1c 5.8% in 2015 and has had no symptoms of reactive hypoglycemia, diabetic polys, paresthesias or visual blurring.  Last A1c was  6.1% 12/28/2015.     Patient has been on thyroid replacement circa 1990's. Further, the patient also has history of Vitamin D Deficiency and supplements vitamin D without any suspected side-effects. Last vitamin D was 66 in Feb 2016.  Medication Sig  . amitriptyline25 MG tablet Take 1 tablet (25 mg total) by mouth at bedtime.  Marland Kitchen aspirin 81 MG tablet Take 81 mg by mouth daily.  Marland Kitchen atenolol  50 MG tablet Take 1 tablet (50 mg total) by mouth at bedtime.  Marland Kitchen BIOTIN  Take 1 tablet by mouth daily.  Thalia Party EC 3 MG  Take 3 mg by mouth 3 (three) times daily as needed.   Marland Kitchen CALCIUM 600 + D Take 1 tablet by mouth 2 (two)  times daily.  . cetirizine  10 MG Take 10 mg by mouth daily.  Marland Kitchen VITAMIN D 2000 UNITS Take 1 capsule by mouth daily.  . fish oil-omega-3  1000 MG  Take 1 g by mouth daily.   Marland Kitchen FLAX SEED OIL 1000 MG  Take 1,000 mg by mouth daily.  Marland Kitchen levothyroxine  125 MCG TAKE 1 TABLET BY MOUTH DAILY BEFORE BREAKFAST  . Multiple Vitamins-Minerals  Take 1 tablet by mouth daily.  . Omeprazole OTC  Take by mouth daily.  . Probiotic Take 1 tablet by mouth daily.   . simvastatin ( 20 MG tablet TAKE 1 TABLET BY MOUTH DAILY  . vitamin B-12  1000 MCG  Take 1,000 mcg by mouth daily.  . vitamin C  500 MG tablet Take 500 mg by mouth daily.   Allergies  Allergen Reactions  . Fosamax [Alendronate Sodium] Other (See Comments)    Gi uPSET  . Singulair [Montelukast Sodium] Other (See Comments)    JITTERS  . Wellbutrin [Bupropion] Hives  . Sulfa Antibiotics Rash   PMHx:   Past Medical History  Diagnosis Date  . Hyperlipidemia   . GERD (gastroesophageal reflux disease)   .  Arthritis   . DJD (degenerative joint disease)   . Osteopenia   . Vitamin D deficiency   . Colitis   . Benign labile hypertension   . Hypothyroidism   . History of basal cell carcinoma excision   . Prediabetes   . History of esophagitis   . Endometrial polyp   . Wears glasses    Immunization History  Administered Date(s) Administered  . Influenza, High Dose Seasonal PF 07/23/2014, 08/23/2015  . Pneumococcal Conjugate-13 05/05/2015  . Pneumococcal-Unspecified 08/22/2011  . Tdap 08/22/2011  . Zoster 06/20/2006   Past Surgical History  Procedure Laterality Date  . Mohs surgery  2005    LEFT NASAL BRIDGE FOR BASAL CELL  . Tonsillectomy  age 65  . Dilation and curettage of uterus    . Dilatation & curettage/hysteroscopy with myosure N/A 10/27/2014    Procedure: DILATATION & CURETTAGE/HYSTEROSCOPY WITH MYOSURE;  Surgeon: Darlyn Chamber, MD;  Location: [place];  Service: Gynecology;  Laterality: N/A;   FHx:    Reviewed  / unchanged  SHx:    Reviewed / unchanged  Systems Review:  Constitutional: Denies fever, chills, wt changes, headaches, insomnia, fatigue, night sweats, change in appetite. Eyes: Denies redness, blurred vision, diplopia, discharge, itchy, watery eyes.  ENT: Denies discharge, congestion, post nasal drip, epistaxis, sore throat, earache, hearing loss, dental pain, tinnitus, vertigo, sinus pain, snoring.  CV: Denies chest pain, palpitations, irregular heartbeat, syncope, dyspnea, diaphoresis, orthopnea, PND, claudication or edema. Respiratory: denies cough, dyspnea, DOE, pleurisy, hoarseness, laryngitis, wheezing.  Gastrointestinal: Denies dysphagia, odynophagia, heartburn, reflux, water brash, abdominal pain or cramps, nausea, vomiting, bloating, diarrhea, constipation, hematemesis, melena, hematochezia  or hemorrhoids. Genitourinary: Denies dysuria, frequency, urgency, nocturia, hesitancy, discharge, hematuria or flank pain. Musculoskeletal: Denies arthralgias, myalgias, stiffness, jt. swelling, pain, limping or strain/sprain.  Skin: Denies pruritus, rash, hives, warts, acne, eczema or change in skin lesion(s). Neuro: No weakness, tremor, incoordination, spasms, paresthesia or pain. Psychiatric: Denies confusion, memory loss or sensory loss. Endo: Denies change in weight, skin or hair change.  Heme/Lymph: No excessive bleeding, bruising or enlarged lymph nodes.  Physical Exam  BP 112/78 mmHg  Pulse 72  Temp(Src) 97.3 F (36.3 C)  Resp 16  Ht 5' 5.5" (1.664 m)  Wt 109 lb 9.6 oz (49.714 kg)  BMI 17.95 kg/m2  Appears well nourished and in no distress.  Eyes: PERRLA, EOMs, conjunctiva no swelling or erythema. Sinuses: No frontal/maxillary tenderness ENT/Mouth: EAC's clear, TM's nl w/o erythema, bulging. Sl tender Rt. Post auricular area. Nares clear w/o erythema, swelling, exudates. Oropharynx clear without erythema or exudates. Oral hygiene is good. Tongue normal, non obstructing.  Hearing intact.  Neck: Supple. Thyroid nl. Car 2+/2+ without bruits, nodes or JVD. Chest: Respirations nl with BS clear & equal w/o rales, rhonchi, wheezing or stridor.  Cor: Heart sounds normal w/ regular rate and rhythm without sig. murmurs, gallops, clicks, or rubs. Peripheral pulses normal and equal  without edema.  Abdomen: Soft & bowel sounds normal. Non-tender w/o guarding, rebound, hernias, masses, or organomegaly.  Lymphatics: Unremarkable.  Musculoskeletal: Full ROM all peripheral extremities, joint stability, 5/5 strength, and normal gait.  Skin: Warm, dry without exposed rashes, lesions or ecchymosis apparent.  Neuro: Cranial nerves intact, reflexes equal bilaterally. Sensory-motor testing grossly intact. Tendon reflexes grossly intact. Monofilament Nl to the toes bilaterally.  Pysch: Alert & oriented x 3.  Insight and judgement nl & appropriate. No ideations.  Assessment and Plan:  1. Essential hypertension  - TSH  2.  Hyperlipidemia  - Lipid panel  3. Prediabetes  - Hemoglobin A1c - Insulin, random  4. Vitamin D deficiency  - VITAMIN D 25 Hydroxy   5. Hypothyroidism  - TSH  6. Medication management  - CBC with Differential/Platelet - BASIC METABOLIC PANEL WITH GFR - Hepatic function panel - Magnesium   Recommended regular exercise, BP monitoring, weight control, and discussed med and SE's. Recommended labs to assess and monitor clinical status. Further disposition pending results of labs. Over 30 minutes of exam, counseling, chart review was performed

## 2016-04-05 LAB — INSULIN, RANDOM: Insulin: 1.9 u[IU]/mL — ABNORMAL LOW (ref 2.0–19.6)

## 2016-04-05 LAB — HEMOGLOBIN A1C
HEMOGLOBIN A1C: 5.7 % — AB (ref <5.7)
MEAN PLASMA GLUCOSE: 117 mg/dL

## 2016-04-05 LAB — VITAMIN D 25 HYDROXY (VIT D DEFICIENCY, FRACTURES): Vit D, 25-Hydroxy: 65 ng/mL (ref 30–100)

## 2016-04-23 DIAGNOSIS — H15122 Nodular episcleritis, left eye: Secondary | ICD-10-CM | POA: Diagnosis not present

## 2016-05-04 DIAGNOSIS — A09 Infectious gastroenteritis and colitis, unspecified: Secondary | ICD-10-CM | POA: Diagnosis not present

## 2016-05-04 DIAGNOSIS — K210 Gastro-esophageal reflux disease with esophagitis: Secondary | ICD-10-CM | POA: Diagnosis not present

## 2016-06-13 ENCOUNTER — Other Ambulatory Visit: Payer: Self-pay | Admitting: *Deleted

## 2016-06-13 DIAGNOSIS — L570 Actinic keratosis: Secondary | ICD-10-CM | POA: Diagnosis not present

## 2016-06-13 DIAGNOSIS — B078 Other viral warts: Secondary | ICD-10-CM | POA: Diagnosis not present

## 2016-06-13 DIAGNOSIS — L84 Corns and callosities: Secondary | ICD-10-CM | POA: Diagnosis not present

## 2016-06-13 DIAGNOSIS — Z85828 Personal history of other malignant neoplasm of skin: Secondary | ICD-10-CM | POA: Diagnosis not present

## 2016-06-13 DIAGNOSIS — C44712 Basal cell carcinoma of skin of right lower limb, including hip: Secondary | ICD-10-CM | POA: Diagnosis not present

## 2016-06-13 DIAGNOSIS — L821 Other seborrheic keratosis: Secondary | ICD-10-CM | POA: Diagnosis not present

## 2016-06-13 DIAGNOSIS — D045 Carcinoma in situ of skin of trunk: Secondary | ICD-10-CM | POA: Diagnosis not present

## 2016-06-13 DIAGNOSIS — D485 Neoplasm of uncertain behavior of skin: Secondary | ICD-10-CM | POA: Diagnosis not present

## 2016-06-13 MED ORDER — LEVOTHYROXINE SODIUM 125 MCG PO TABS: ORAL_TABLET | ORAL | 1 refills | Status: DC

## 2016-06-14 IMAGING — CR DG CERVICAL SPINE COMPLETE 4+V
5 series · 5 of 5 positions shown · non-contrast
Comparison: 12/29/2013

CLINICAL DATA: Right neck pain x1 week, no known injury

EXAM:
CERVICAL SPINE  4+ VIEWS

[view not recorded (1 of 5)]
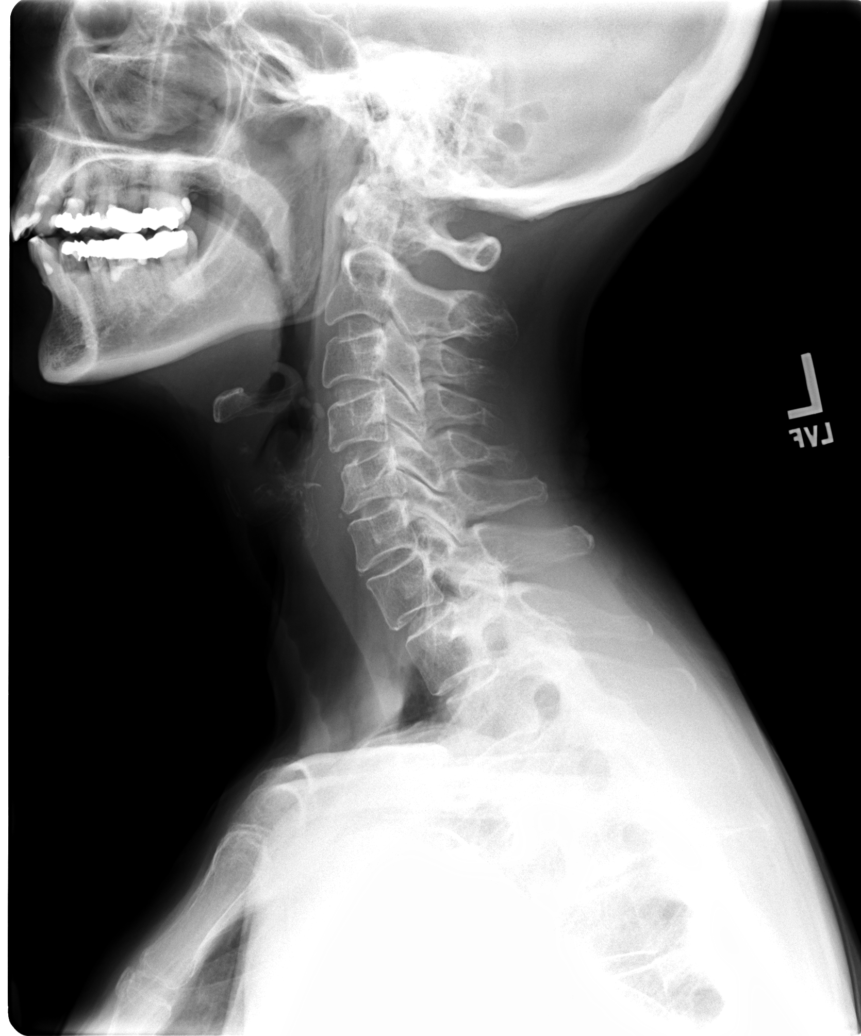

[view not recorded (2 of 5)]
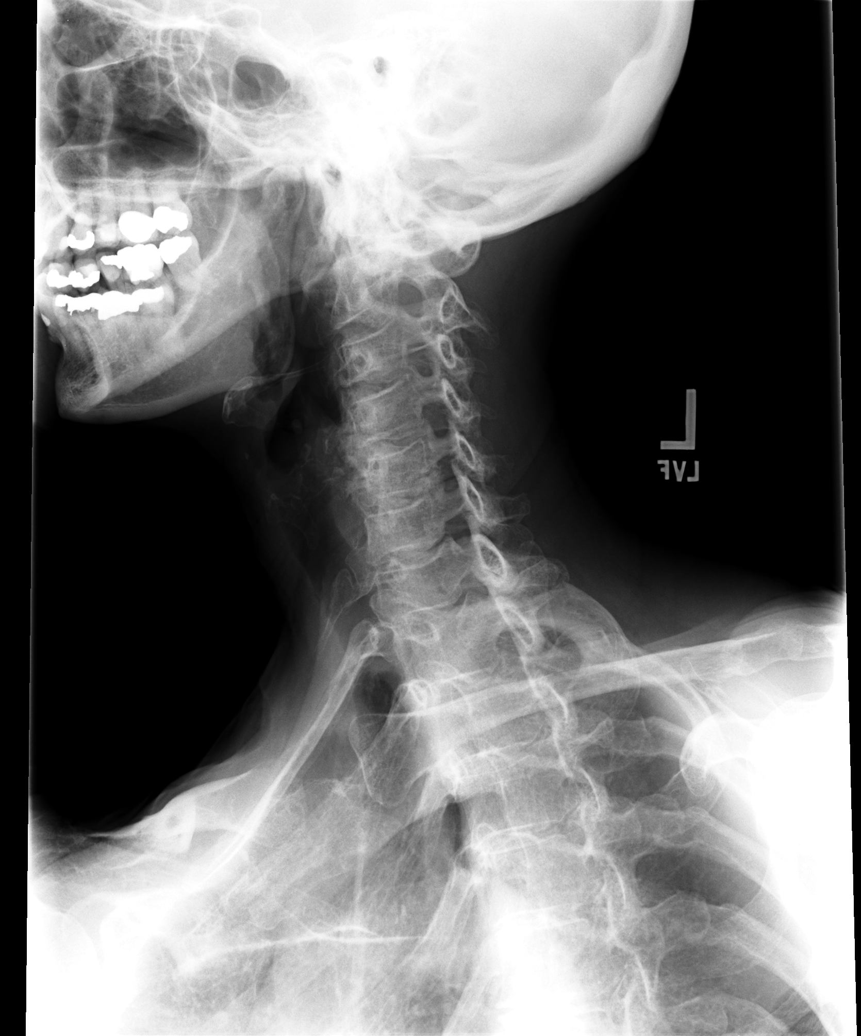

[view not recorded (3 of 5)]
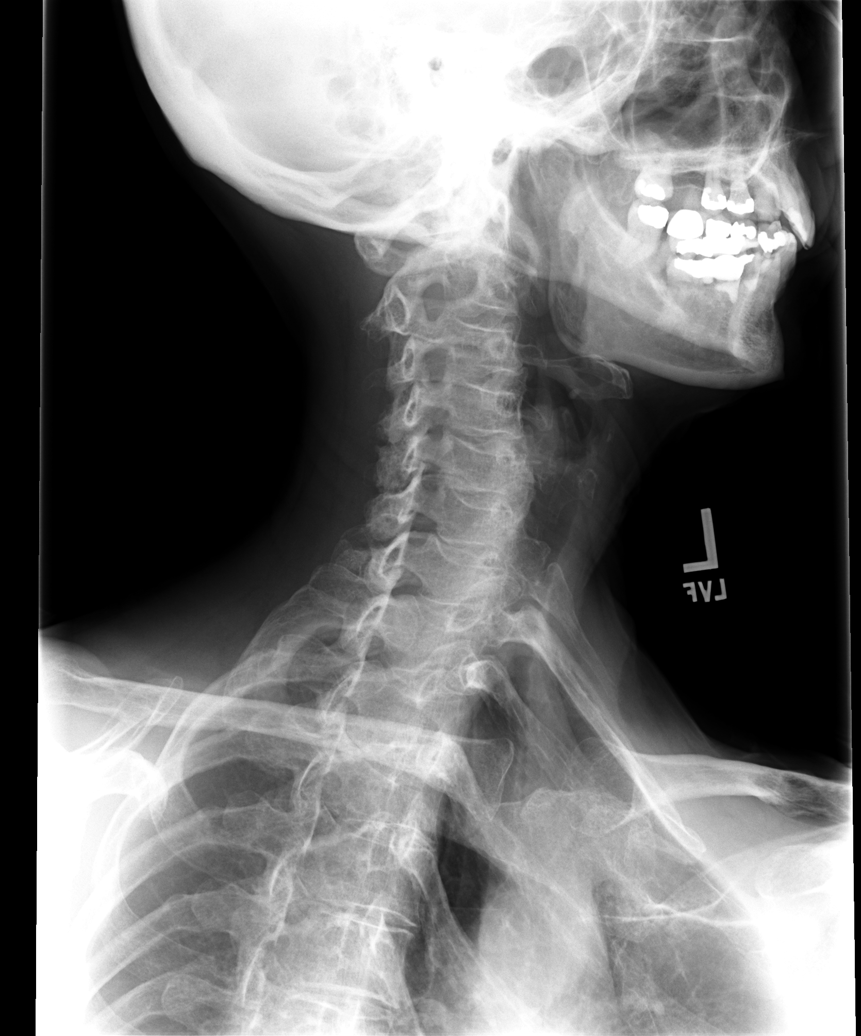

[view not recorded (4 of 5)]
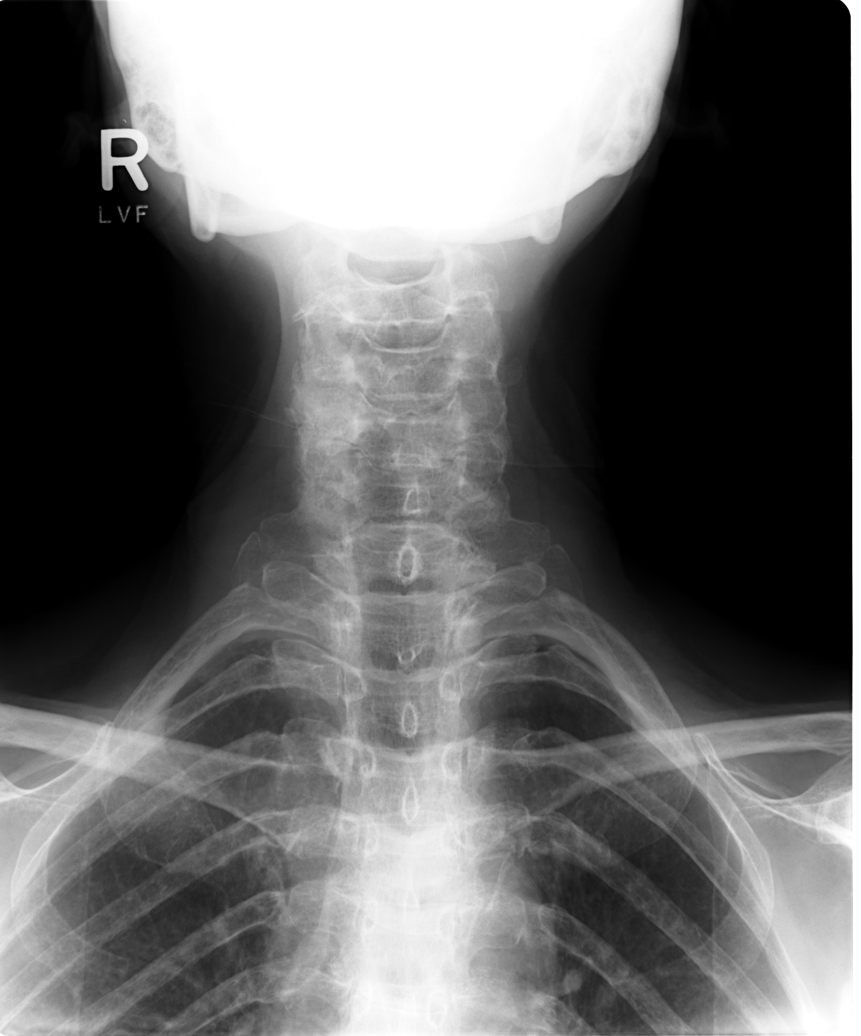

[view not recorded (5 of 5)]
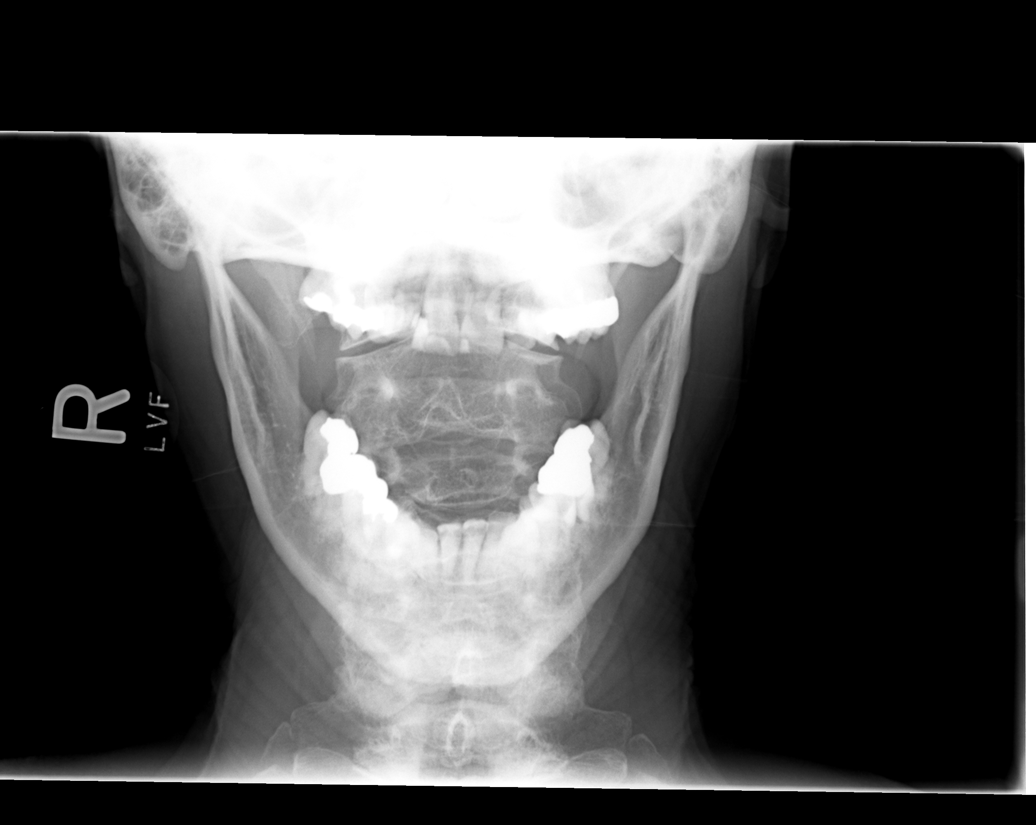

[5 of 5 positions shown; findings below may reference images not displayed]

FINDINGS: Normal cervical lordosis.

No evidence of fracture or dislocation. Vertebral body heights and
intervertebral disc spaces are maintained.

Dens is obscured.

Bilateral neural foramina are patent.

Mild degenerative changes at C5-6.

Visualized lung apices are clear.
IMPRESSION: No fracture or dislocation is seen.

Mild degenerative changes at C5-6.

## 2016-07-03 ENCOUNTER — Other Ambulatory Visit: Payer: Self-pay | Admitting: Internal Medicine

## 2016-07-05 ENCOUNTER — Ambulatory Visit: Payer: Self-pay | Admitting: Physician Assistant

## 2016-07-10 ENCOUNTER — Encounter: Payer: Self-pay | Admitting: Internal Medicine

## 2016-07-10 ENCOUNTER — Ambulatory Visit (INDEPENDENT_AMBULATORY_CARE_PROVIDER_SITE_OTHER): Payer: Medicare Other | Admitting: Internal Medicine

## 2016-07-10 VITALS — BP 126/70 | HR 66 | Temp 97.5°F | Resp 16 | Ht 65.5 in | Wt 109.2 lb

## 2016-07-10 DIAGNOSIS — E559 Vitamin D deficiency, unspecified: Secondary | ICD-10-CM | POA: Diagnosis not present

## 2016-07-10 DIAGNOSIS — R7303 Prediabetes: Secondary | ICD-10-CM

## 2016-07-10 DIAGNOSIS — D649 Anemia, unspecified: Secondary | ICD-10-CM

## 2016-07-10 DIAGNOSIS — Z1159 Encounter for screening for other viral diseases: Secondary | ICD-10-CM

## 2016-07-10 DIAGNOSIS — E785 Hyperlipidemia, unspecified: Secondary | ICD-10-CM | POA: Diagnosis not present

## 2016-07-10 DIAGNOSIS — Z79899 Other long term (current) drug therapy: Secondary | ICD-10-CM | POA: Diagnosis not present

## 2016-07-10 DIAGNOSIS — E039 Hypothyroidism, unspecified: Secondary | ICD-10-CM

## 2016-07-10 DIAGNOSIS — Z23 Encounter for immunization: Secondary | ICD-10-CM | POA: Diagnosis not present

## 2016-07-10 DIAGNOSIS — I10 Essential (primary) hypertension: Secondary | ICD-10-CM

## 2016-07-10 LAB — CBC WITH DIFFERENTIAL/PLATELET
BASOS ABS: 0 {cells}/uL (ref 0–200)
Basophils Relative: 0 %
EOS ABS: 71 {cells}/uL (ref 15–500)
Eosinophils Relative: 1 %
HEMATOCRIT: 40.2 % (ref 35.0–45.0)
HEMOGLOBIN: 13.4 g/dL (ref 11.7–15.5)
LYMPHS ABS: 1988 {cells}/uL (ref 850–3900)
Lymphocytes Relative: 28 %
MCH: 31.8 pg (ref 27.0–33.0)
MCHC: 33.3 g/dL (ref 32.0–36.0)
MCV: 95.3 fL (ref 80.0–100.0)
MONO ABS: 568 {cells}/uL (ref 200–950)
MPV: 11.9 fL (ref 7.5–12.5)
Monocytes Relative: 8 %
NEUTROS ABS: 4473 {cells}/uL (ref 1500–7800)
NEUTROS PCT: 63 %
Platelets: 186 10*3/uL (ref 140–400)
RBC: 4.22 MIL/uL (ref 3.80–5.10)
RDW: 14.8 % (ref 11.0–15.0)
WBC: 7.1 10*3/uL (ref 3.8–10.8)

## 2016-07-10 LAB — BASIC METABOLIC PANEL WITH GFR
BUN: 15 mg/dL (ref 7–25)
CALCIUM: 9.3 mg/dL (ref 8.6–10.4)
CHLORIDE: 106 mmol/L (ref 98–110)
CO2: 26 mmol/L (ref 20–31)
CREATININE: 0.84 mg/dL (ref 0.60–0.93)
GFR, EST AFRICAN AMERICAN: 79 mL/min (ref >=60)
GFR, Est Non African American: 69 mL/min (ref >=60)
Glucose, Bld: 92 mg/dL (ref 65–99)
Potassium: 4.2 mmol/L (ref 3.5–5.3)
SODIUM: 138 mmol/L (ref 135–146)

## 2016-07-10 LAB — HEPATIC FUNCTION PANEL
ALT: 16 U/L (ref 6–29)
AST: 20 U/L (ref 10–35)
Albumin: 4.1 g/dL (ref 3.6–5.1)
Alkaline Phosphatase: 43 U/L (ref 33–130)
BILIRUBIN DIRECT: 0.1 mg/dL (ref <=0.2)
Indirect Bilirubin: 0.3 mg/dL (ref 0.2–1.2)
Total Bilirubin: 0.4 mg/dL (ref 0.2–1.2)
Total Protein: 5.9 g/dL — ABNORMAL LOW (ref 6.1–8.1)

## 2016-07-10 LAB — LIPID PANEL
CHOL/HDL RATIO: 2.5 ratio (ref <=5.0)
CHOLESTEROL: 164 mg/dL (ref 125–200)
HDL: 66 mg/dL (ref >=46)
LDL Cholesterol: 83 mg/dL (ref <130)
TRIGLYCERIDES: 76 mg/dL (ref <150)
VLDL: 15 mg/dL (ref <30)

## 2016-07-10 NOTE — Progress Notes (Signed)
Assessment and Plan:  Hypertension:  -Continue medication -monitor blood pressure at home. -Continue DASH diet -Reminder to go to the ER if any CP, SOB, nausea, dizziness, severe HA, changes vision/speech, left arm numbness and tingling and jaw pain.  Cholesterol - Continue diet and exercise -Check cholesterol.   Diabetes without complications -Continue diet and exercise.  -Check A1C  Vitamin D Def -check level -continue medications.   Dizziness and fatigue -check cbc for anemia -check thyroid levels -stop day time naps -tight control of seasonal allergies  Hypothyroidism -TSH -reminder to take meds on an empty stomach  Continue diet and meds as discussed. Further disposition pending results of labs. Discussed med's effects and SE's.    HPI 75 y.o. female  presents for 3 month follow up with hypertension, hyperlipidemia, diabetes and vitamin D deficiency.   Her blood pressure has been controlled at home, today their BP is BP: 126/70.She does workout. She denies chest pain, shortness of breath, dizziness.   She is on cholesterol medication and denies myalgias. Her cholesterol is at goal. The cholesterol was:  04/04/2016: Cholesterol 166; HDL 64; LDL Cholesterol 89; Triglycerides 63   She has been working on diet and exercise for prediabetes without complications, she is on bASA, she is not on ACE/ARB, and denies  foot ulcerations, hyperglycemia, hypoglycemia , increased appetite, nausea, paresthesia of the feet, polydipsia, polyuria, visual disturbances, vomiting and weight loss. Last A1C was: 04/04/2016: Hgb A1c MFr Bld 5.7   Patient is on Vitamin D supplement. 04/04/2016: Vit D, 25-Hydroxy 65  She reports that for the past couple weeks she has been very weak and fatigued.  She reports that she felt like about a week ago that she was going to pass out.  She reports that she is not sleeping well.  She reports that she is sweating sometimes.  She is taking some naps during the  daytime.  She reports that she just feels off kilter.      Current Medications:  Current Outpatient Prescriptions on File Prior to Visit  Medication Sig Dispense Refill  . aspirin 81 MG tablet Take 81 mg by mouth daily.    Marland Kitchen atenolol (TENORMIN) 50 MG tablet Take 1 tablet (50 mg total) by mouth at bedtime. 30 tablet 11  . BIOTIN PO Take 1 tablet by mouth daily.    . budesonide (ENTOCORT EC) 3 MG 24 hr capsule Take 3 mg by mouth 3 (three) times daily as needed.     . Calcium Carb-Cholecalciferol (CALCIUM 600 + D PO) Take 1 tablet by mouth 2 (two) times daily.    . cetirizine (ZYRTEC) 10 MG tablet Take 10 mg by mouth daily.    . Cholecalciferol (VITAMIN D3) 2000 UNITS TABS Take 1 capsule by mouth daily.    . fish oil-omega-3 fatty acids 1000 MG capsule Take 1 g by mouth daily.     . Flaxseed, Linseed, (FLAX SEED OIL) 1000 MG CAPS Take 1,000 mg by mouth daily.    Marland Kitchen levothyroxine (SYNTHROID, LEVOTHROID) 125 MCG tablet TAKE 1 TABLET BY MOUTH DAILY BEFORE BREAKFAST 90 tablet 1  . Multiple Vitamins-Minerals (MULTIVITAMIN WITH MINERALS) tablet Take 1 tablet by mouth daily.    . Omeprazole Magnesium (PRILOSEC OTC PO) Take by mouth daily.    . Probiotic Product (PROBIOTIC DAILY PO) Take 1 tablet by mouth daily.     . simvastatin (ZOCOR) 20 MG tablet TAKE 1 TABLET BY MOUTH DAILY 90 tablet 1  . vitamin B-12 (CYANOCOBALAMIN) 1000 MCG tablet  Take 1,000 mcg by mouth daily.    . vitamin C (ASCORBIC ACID) 500 MG tablet Take 500 mg by mouth daily.     No current facility-administered medications on file prior to visit.    Medical History:  Past Medical History:  Diagnosis Date  . Arthritis   . Benign labile hypertension   . Colitis   . DJD (degenerative joint disease)   . Endometrial polyp   . GERD (gastroesophageal reflux disease)   . History of basal cell carcinoma excision   . History of esophagitis   . Hyperlipidemia   . Hypothyroidism   . Osteopenia   . Prediabetes   . Vitamin D deficiency    . Wears glasses    Allergies:  Allergies  Allergen Reactions  . Fosamax [Alendronate Sodium] Other (See Comments)    Gi uPSET  . Singulair [Montelukast Sodium] Other (See Comments)    JITTERS  . Wellbutrin [Bupropion] Hives  . Sulfa Antibiotics Rash     Review of Systems:  Review of Systems  Constitutional: Positive for malaise/fatigue. Negative for chills and fever.  HENT: Negative for congestion, ear pain and sore throat.   Respiratory: Negative for cough, hemoptysis, sputum production, shortness of breath and wheezing.   Cardiovascular: Negative for chest pain, palpitations and leg swelling.  Gastrointestinal: Negative for blood in stool, constipation, diarrhea, heartburn, melena, nausea and vomiting.  Genitourinary: Negative for dysuria, flank pain, frequency, hematuria and urgency.  Neurological: Positive for dizziness. Negative for sensory change, loss of consciousness and headaches.  Psychiatric/Behavioral: Negative for depression. The patient is not nervous/anxious and does not have insomnia.     Family history- Review and unchanged  Social history- Review and unchanged  Physical Exam: BP 126/70   Pulse 66   Temp 97.5 F (36.4 C)   Resp 16   Ht 5' 5.5" (1.664 m)   Wt 109 lb 3.2 oz (49.5 kg)   SpO2 99%   BMI 17.90 kg/m  Wt Readings from Last 3 Encounters:  07/10/16 109 lb 3.2 oz (49.5 kg)  04/04/16 109 lb 9.6 oz (49.7 kg)  12/28/15 111 lb 6.4 oz (50.5 kg)   General Appearance: Well nourished well developed, non-toxic appearing, in no apparent distress. Eyes: PERRLA, EOMs, conjunctiva no swelling or erythema ENT/Mouth: Ear canals clear with no erythema, swelling, or discharge.  TMs normal bilaterally, oropharynx clear, moist, with no exudate.   Neck: Supple, thyroid normal, no JVD, no cervical adenopathy.  Respiratory: Respiratory effort normal, breath sounds clear A&P, no wheeze, rhonchi or rales noted.  No retractions, no accessory muscle usage Cardio: RRR  with no MRGs. No noted edema.  Abdomen: Soft, + BS.  Non tender, no guarding, rebound, hernias, masses. Musculoskeletal: Full ROM, 5/5 strength, Normal gait Skin: Warm, dry without rashes, lesions, ecchymosis.  Neuro: Awake and oriented X 3, Cranial nerves intact. No cerebellar symptoms.  Psych: normal affect, Insight and Judgment appropriate.    Starlyn Skeans, PA-C 12:03 PM [facility]

## 2016-07-11 LAB — HEMOGLOBIN A1C
Hgb A1c MFr Bld: 5.5 % (ref <5.7)
Mean Plasma Glucose: 111 mg/dL

## 2016-07-11 LAB — HEPATITIS C ANTIBODY: HCV Ab: NEGATIVE

## 2016-07-11 LAB — TSH: TSH: 0.71 m[IU]/L

## 2016-08-17 ENCOUNTER — Other Ambulatory Visit: Payer: Self-pay | Admitting: Physician Assistant

## 2016-08-17 MED ORDER — BISOPROLOL-HYDROCHLOROTHIAZIDE 10-6.25 MG PO TABS: ORAL_TABLET | ORAL | 3 refills | Status: DC

## 2016-09-04 ENCOUNTER — Other Ambulatory Visit: Payer: Self-pay | Admitting: Internal Medicine

## 2016-09-04 ENCOUNTER — Encounter: Payer: Self-pay | Admitting: Internal Medicine

## 2016-09-04 ENCOUNTER — Ambulatory Visit (INDEPENDENT_AMBULATORY_CARE_PROVIDER_SITE_OTHER): Payer: Medicare Other | Admitting: Internal Medicine

## 2016-09-04 VITALS — BP 110/58 | HR 68 | Temp 98.2°F | Resp 16

## 2016-09-04 DIAGNOSIS — I10 Essential (primary) hypertension: Secondary | ICD-10-CM

## 2016-09-04 MED ORDER — BISOPROLOL-HYDROCHLOROTHIAZIDE 2.5-6.25 MG PO TABS: 1.0000 | ORAL_TABLET | Freq: Every day | ORAL | 0 refills | Status: DC

## 2016-09-04 NOTE — Patient Instructions (Signed)
Please call me if the atenolol is back in stock.  I will change you back to your regular dose if they have it.  Ask the pharmacist if they have any dose.

## 2016-09-04 NOTE — Progress Notes (Signed)
Assessment and Plan:   1. Essential hypertension -cut ziac to 2.5 mg -monitor BP at home -increase salty food intake.  -cont other meds      HPI 75 y.o.female presents for 1 month follow up of blood pressure. Patient reports that they have been feeling really tired and washed out.  female is taking her ziac.  They are having difficulty with their medications.  They report some adverse reactions.  She reports that her blood pressure are running really low at home.  Some are around 90/50.   Past Medical History:  Diagnosis Date  . Arthritis   . Benign labile hypertension   . Colitis   . DJD (degenerative joint disease)   . Endometrial polyp   . GERD (gastroesophageal reflux disease)   . History of basal cell carcinoma excision   . History of esophagitis   . Hyperlipidemia   . Hypothyroidism   . Osteopenia   . Prediabetes   . Vitamin D deficiency   . Wears glasses      Allergies  Allergen Reactions  . Fosamax [Alendronate Sodium] Other (See Comments)    Gi uPSET  . Singulair [Montelukast Sodium] Other (See Comments)    JITTERS  . Wellbutrin [Bupropion] Hives  . Sulfa Antibiotics Rash      Current Outpatient Prescriptions on File Prior to Visit  Medication Sig Dispense Refill  . aspirin 81 MG tablet Take 81 mg by mouth daily.    Marland Kitchen BIOTIN PO Take 1 tablet by mouth daily.    . bisoprolol-hydrochlorothiazide (ZIAC) 10-6.25 MG tablet 1/2 - 1 tablet daily to replace atenolol, monitor BP 30 tablet 3  . budesonide (ENTOCORT EC) 3 MG 24 hr capsule Take 3 mg by mouth 3 (three) times daily as needed.     . Calcium Carb-Cholecalciferol (CALCIUM 600 + D PO) Take 1 tablet by mouth 2 (two) times daily.    . cetirizine (ZYRTEC) 10 MG tablet Take 10 mg by mouth daily.    . Cholecalciferol (VITAMIN D3) 2000 UNITS TABS Take 1 capsule by mouth daily.    . fish oil-omega-3 fatty acids 1000 MG capsule Take 1 g by mouth daily.     . Flaxseed, Linseed, (FLAX SEED OIL) 1000 MG CAPS Take  1,000 mg by mouth daily.    Marland Kitchen levothyroxine (SYNTHROID, LEVOTHROID) 125 MCG tablet TAKE 1 TABLET BY MOUTH DAILY BEFORE BREAKFAST 90 tablet 1  . Multiple Vitamins-Minerals (MULTIVITAMIN WITH MINERALS) tablet Take 1 tablet by mouth daily.    . Omeprazole Magnesium (PRILOSEC OTC PO) Take by mouth daily.    . Probiotic Product (PROBIOTIC DAILY PO) Take 1 tablet by mouth daily.     . simvastatin (ZOCOR) 20 MG tablet TAKE 1 TABLET BY MOUTH DAILY 90 tablet 1  . vitamin B-12 (CYANOCOBALAMIN) 1000 MCG tablet Take 1,000 mcg by mouth daily.    . vitamin C (ASCORBIC ACID) 500 MG tablet Take 500 mg by mouth daily.     No current facility-administered medications on file prior to visit.     ROS: all negative except above.   Physical Exam: There were no vitals filed for this visit. BP (!) 110/58   Pulse 68   Temp 98.2 F (36.8 C) (Temporal)   Resp 16  General Appearance: Well developed well nourished, non-toxic appearing in no apparent distress. Eyes: PERRLA, EOMs, conjunctiva w/ no swelling or erythema or discharge Sinuses: No Frontal/maxillary tenderness ENT/Mouth: Ear canals clear without swelling or erythema.  TM's normal bilaterally with no  retractions, bulging, or loss of landmarks.   Neck: Supple, thyroid normal, no notable JVD  Respiratory: Respiratory effort normal, Clear breath sounds anteriorly and posteriorly bilaterally without rales, rhonchi, wheezing or stridor. No retractions or accessory muscle usage. Cardio: RRR with no MRGs.   Abdomen: Soft, + BS.  Non tender, no guarding, rebound, hernias, masses.  Musculoskeletal: Full ROM, 5/5 strength, normal gait.  Skin: Warm, dry without rashes  Neuro: Awake and oriented X 3, Cranial nerves intact. Normal muscle tone, no cerebellar symptoms. Sensation intact.  Psych: normal affect, Insight and Judgment appropriate.     Starlyn Skeans, PA-C 3:06 PM [facility]

## 2016-10-01 DIAGNOSIS — K449 Diaphragmatic hernia without obstruction or gangrene: Secondary | ICD-10-CM | POA: Diagnosis not present

## 2016-10-01 DIAGNOSIS — K3189 Other diseases of stomach and duodenum: Secondary | ICD-10-CM | POA: Diagnosis not present

## 2016-10-01 DIAGNOSIS — K2270 Barrett's esophagus without dysplasia: Secondary | ICD-10-CM | POA: Diagnosis not present

## 2016-10-01 DIAGNOSIS — K269 Duodenal ulcer, unspecified as acute or chronic, without hemorrhage or perforation: Secondary | ICD-10-CM | POA: Diagnosis not present

## 2016-10-04 DIAGNOSIS — K2270 Barrett's esophagus without dysplasia: Secondary | ICD-10-CM | POA: Diagnosis not present

## 2016-10-08 ENCOUNTER — Ambulatory Visit (INDEPENDENT_AMBULATORY_CARE_PROVIDER_SITE_OTHER): Payer: Medicare Other | Admitting: Internal Medicine

## 2016-10-08 ENCOUNTER — Encounter: Payer: Self-pay | Admitting: Internal Medicine

## 2016-10-08 VITALS — BP 116/64 | HR 76 | Temp 98.2°F | Resp 16 | Ht 65.5 in | Wt 107.0 lb

## 2016-10-08 DIAGNOSIS — I10 Essential (primary) hypertension: Secondary | ICD-10-CM | POA: Diagnosis not present

## 2016-10-08 DIAGNOSIS — T7840XA Allergy, unspecified, initial encounter: Secondary | ICD-10-CM

## 2016-10-08 DIAGNOSIS — R7303 Prediabetes: Secondary | ICD-10-CM

## 2016-10-08 DIAGNOSIS — E559 Vitamin D deficiency, unspecified: Secondary | ICD-10-CM | POA: Diagnosis not present

## 2016-10-08 DIAGNOSIS — D649 Anemia, unspecified: Secondary | ICD-10-CM | POA: Diagnosis not present

## 2016-10-08 DIAGNOSIS — Z79899 Other long term (current) drug therapy: Secondary | ICD-10-CM | POA: Diagnosis not present

## 2016-10-08 DIAGNOSIS — K2100 Gastro-esophageal reflux disease with esophagitis, without bleeding: Secondary | ICD-10-CM

## 2016-10-08 DIAGNOSIS — E039 Hypothyroidism, unspecified: Secondary | ICD-10-CM

## 2016-10-08 DIAGNOSIS — R6889 Other general symptoms and signs: Secondary | ICD-10-CM

## 2016-10-08 DIAGNOSIS — M8589 Other specified disorders of bone density and structure, multiple sites: Secondary | ICD-10-CM | POA: Diagnosis not present

## 2016-10-08 DIAGNOSIS — Z0000 Encounter for general adult medical examination without abnormal findings: Secondary | ICD-10-CM

## 2016-10-08 DIAGNOSIS — K529 Noninfective gastroenteritis and colitis, unspecified: Secondary | ICD-10-CM

## 2016-10-08 DIAGNOSIS — E782 Mixed hyperlipidemia: Secondary | ICD-10-CM

## 2016-10-08 DIAGNOSIS — M1990 Unspecified osteoarthritis, unspecified site: Secondary | ICD-10-CM

## 2016-10-08 DIAGNOSIS — J45909 Unspecified asthma, uncomplicated: Secondary | ICD-10-CM

## 2016-10-08 DIAGNOSIS — Z0001 Encounter for general adult medical examination with abnormal findings: Secondary | ICD-10-CM

## 2016-10-08 LAB — CBC WITH DIFFERENTIAL/PLATELET
BASOS ABS: 0 {cells}/uL (ref 0–200)
Basophils Relative: 0 %
EOS ABS: 67 {cells}/uL (ref 15–500)
EOS PCT: 1 %
HEMATOCRIT: 41.5 % (ref 35.0–45.0)
HEMOGLOBIN: 14.1 g/dL (ref 11.7–15.5)
LYMPHS ABS: 1340 {cells}/uL (ref 850–3900)
Lymphocytes Relative: 20 %
MCH: 31.8 pg (ref 27.0–33.0)
MCHC: 34 g/dL (ref 32.0–36.0)
MCV: 93.7 fL (ref 80.0–100.0)
MONO ABS: 603 {cells}/uL (ref 200–950)
MPV: 10.9 fL (ref 7.5–12.5)
Monocytes Relative: 9 %
NEUTROS ABS: 4690 {cells}/uL (ref 1500–7800)
Neutrophils Relative %: 70 %
Platelets: 190 10*3/uL (ref 140–400)
RBC: 4.43 MIL/uL (ref 3.80–5.10)
RDW: 14.4 % (ref 11.0–15.0)
WBC: 6.7 10*3/uL (ref 3.8–10.8)

## 2016-10-08 LAB — TSH: TSH: 0.88 m[IU]/L

## 2016-10-08 LAB — BASIC METABOLIC PANEL WITH GFR
BUN: 12 mg/dL (ref 7–25)
CHLORIDE: 103 mmol/L (ref 98–110)
CO2: 28 mmol/L (ref 20–31)
CREATININE: 0.76 mg/dL (ref 0.60–0.93)
Calcium: 9.3 mg/dL (ref 8.6–10.4)
GFR, Est African American: 89 mL/min (ref >=60)
GFR, Est Non African American: 77 mL/min (ref >=60)
Glucose, Bld: 83 mg/dL (ref 65–99)
Potassium: 4.3 mmol/L (ref 3.5–5.3)
SODIUM: 139 mmol/L (ref 135–146)

## 2016-10-08 LAB — HEPATIC FUNCTION PANEL
ALT: 17 U/L (ref 6–29)
AST: 24 U/L (ref 10–35)
Albumin: 4.3 g/dL (ref 3.6–5.1)
Alkaline Phosphatase: 49 U/L (ref 33–130)
BILIRUBIN DIRECT: 0.1 mg/dL (ref <=0.2)
BILIRUBIN INDIRECT: 0.4 mg/dL (ref 0.2–1.2)
Total Bilirubin: 0.5 mg/dL (ref 0.2–1.2)
Total Protein: 6.3 g/dL (ref 6.1–8.1)

## 2016-10-08 LAB — LIPID PANEL
CHOL/HDL RATIO: 2.4 ratio (ref <5.0)
CHOLESTEROL: 165 mg/dL (ref <200)
HDL: 68 mg/dL (ref >50)
LDL Cholesterol: 85 mg/dL (ref <100)
TRIGLYCERIDES: 58 mg/dL (ref <150)
VLDL: 12 mg/dL (ref <30)

## 2016-10-08 NOTE — Progress Notes (Signed)
MEDICARE ANNUAL WELLNESS VISIT AND FOLLOW UP  Assessment:    1. Essential hypertension -cont meds -dash diet -exercise as tolerated -monitor at home -well controlled - TSH  2. Uncomplicated asthma, unspecified asthma severity, unspecified whether persistent -cont albuterol prn  3. Colitis -managed by GI  4. GERD -cont PPI -managed by GI  5. Hypothyroidism, unspecified type -cont levothyroxine -TSH  6. Osteoarthritis, unspecified osteoarthritis type, unspecified site -cont calcium and Vit D -tylenol prn 7. Osteopenia of multiple sites -cont calcium and Vit D   8. Allergic state, initial encounter -cont claritin prn  9. Anemia, unspecified type -cont B complex  -cont slow release iron  10. Encounter for Medicare annual wellness exam -due next year  77. Mixed hyperlipidemia -cont diet and exercise - Lipid panel  12. Medication management  - CBC with Differential/Platelet - BASIC METABOLIC PANEL WITH GFR - Hepatic function panel  13. Prediabetes -cont diet and exercise - Hemoglobin A1c  14. Vitamin D deficiency -cont Vit D    Over 30 minutes of exam, counseling, chart review, and critical decision making was performed  Future Appointments Date Time Provider [place]  12/31/2016 2:00 PM Vicie Mutters, PA-C [id number] None    Plan:   During the course of the visit the patient was educated and counseled about appropriate screening and preventive services including:    Pneumococcal vaccine   Influenza vaccine  Td vaccine  Prevnar 13  Screening electrocardiogram  Screening mammography  Bone densitometry screening  Colorectal cancer screening  Diabetes screening  Glaucoma screening  Nutrition counseling   Advanced directives: given info/requested copies   Subjective:   Debbie Houston is a 75 y.o. female who presents for Medicare Annual Wellness Visit and 3 month follow up on hypertension, prediabetes, hyperlipidemia,  vitamin D def.   Her blood pressure has been controlled at home, today their BP is BP: 116/64 She does not workout. She denies chest pain, shortness of breath, dizziness.   She is on cholesterol medication and denies myalgias. Her cholesterol is at goal. The cholesterol last visit was:  Lab Results  Component Value Date   CHOL 164 07/10/2016   HDL 66 07/10/2016   LDLCALC 83 07/10/2016   TRIG 76 07/10/2016   CHOLHDL 2.5 07/10/2016   She has been working on diet and exercise for prediabetes, and denies foot ulcerations, hyperglycemia, hypoglycemia , increased appetite, nausea, paresthesia of the feet, polydipsia, polyuria, visual disturbances, vomiting and weight loss. Last A1C in the office was: Lab Results  Component Value Date   HGBA1C 5.5 07/10/2016   Last GFR Lab Results  Component Value Date   GFRNONAA 69 07/10/2016   Lab Results  Component Value Date   GFRAA 79 07/10/2016   Patient is on Vitamin D supplement. Lab Results  Component Value Date   VD25OH 65 04/04/2016     Patient reports that recently had an endoscopy.   She has some barrets esophagus changes.  She did have some biopsy's done.  She is due to follow with him.  She reports that Dr. Watt Climes is supposed to send over a note.      Medication Review Current Outpatient Prescriptions on File Prior to Visit  Medication Sig Dispense Refill  . aspirin 81 MG tablet Take 81 mg by mouth daily.    Marland Kitchen BIOTIN PO Take 1 tablet by mouth daily.    . bisoprolol-hydrochlorothiazide (ZIAC) 2.5-6.25 MG tablet TAKE 1 TABLET BY MOUTH DAILY 90 tablet 0  . budesonide (ENTOCORT EC)  3 MG 24 hr capsule Take 3 mg by mouth 3 (three) times daily as needed.     . Calcium Carb-Cholecalciferol (CALCIUM 600 + D PO) Take 1 tablet by mouth 2 (two) times daily.    . cetirizine (ZYRTEC) 10 MG tablet Take 10 mg by mouth daily.    . Cholecalciferol (VITAMIN D3) 2000 UNITS TABS Take 1 capsule by mouth daily.    . fish oil-omega-3 fatty acids 1000 MG  capsule Take 1 g by mouth daily.     . Flaxseed, Linseed, (FLAX SEED OIL) 1000 MG CAPS Take 1,000 mg by mouth daily.    Marland Kitchen levothyroxine (SYNTHROID, LEVOTHROID) 125 MCG tablet TAKE 1 TABLET BY MOUTH DAILY BEFORE BREAKFAST 90 tablet 1  . Multiple Vitamins-Minerals (MULTIVITAMIN WITH MINERALS) tablet Take 1 tablet by mouth daily.    . Omeprazole Magnesium (PRILOSEC OTC PO) Take by mouth daily.    . Probiotic Product (PROBIOTIC DAILY PO) Take 1 tablet by mouth daily.     . simvastatin (ZOCOR) 20 MG tablet TAKE 1 TABLET BY MOUTH DAILY 90 tablet 1  . vitamin B-12 (CYANOCOBALAMIN) 1000 MCG tablet Take 1,000 mcg by mouth daily.    . vitamin C (ASCORBIC ACID) 500 MG tablet Take 500 mg by mouth daily.     No current facility-administered medications on file prior to visit.     Allergies: Allergies  Allergen Reactions  . Fosamax [Alendronate Sodium] Other (See Comments)    Gi uPSET  . Singulair [Montelukast Sodium] Other (See Comments)    JITTERS  . Wellbutrin [Bupropion] Hives  . Sulfa Antibiotics Rash    Current Problems (verified) has Essential hypertension; Hyperlipidemia; Hypothyroid; GERD; Asthma; DJD (degenerative joint disease); Vitamin D deficiency; Osteopenia; Allergy; Anemia; Prediabetes; Medication management; Colitis; and Encounter for Medicare annual wellness exam on her problem list.  Screening Tests Immunization History  Administered Date(s) Administered  . Influenza, High Dose Seasonal PF 07/23/2014, 08/23/2015, 07/10/2016  . Pneumococcal Conjugate-13 05/05/2015  . Pneumococcal-Unspecified 08/22/2011  . Tdap 08/22/2011  . Zoster 06/20/2006    Preventative care: Last colonoscopy: 2015 Last mammogram: 2017   [id number]  Names of Other Physician/Practitioners you currently use: 1. [facility]- here for primary care 2. Dr. Sabra Heck, eye doctor, last visit  3. Dr. , dentist, last visit 2017 Patient Care Team: Unk Pinto, MD as  PCP - General (Internal Medicine)  Surgical: She  has a past surgical history that includes Mohs surgery (2005); Tonsillectomy (age 40); Dilation and curettage of uterus; and Dilatation & curettage/hysteroscopy with myosure (N/A, 10/27/2014). Family Her family history includes Cancer in her father, maternal grandmother, mother, and sister; Diabetes in her brother and maternal grandfather; Hyperlipidemia in her brother, father, maternal grandfather, maternal grandmother, mother, paternal grandfather, paternal grandmother, and sister; Hypertension in her mother; Stroke in her maternal grandfather. Social history  She reports that she has been smoking Cigarettes.  She has smoked for the past 15.00 years. She has never used smokeless tobacco. She reports that she drinks alcohol. She reports that she does not use drugs.  MEDICARE WELLNESS OBJECTIVES: Physical activity:   Cardiac risk factors:   Depression/mood screen:   Depression screen [facility] 2/9 04/04/2016  Decreased Interest 0  Down, Depressed, Hopeless 0  PHQ - 2 Score 0    ADLs:  In your present state of health, do you have any difficulty performing the following activities: 04/04/2016  Hearing? N  Vision? N  Difficulty concentrating or making decisions? N  Walking or  climbing stairs? N  Dressing or bathing? N  Doing errands, shopping? N  Some recent data might be hidden     Cognitive Testing  Alert? Yes  Normal Appearance?Yes  Oriented to person? Yes  Place? Yes   Time? Yes  Recall of three objects?  Yes  Can perform simple calculations? Yes  Displays appropriate judgment?Yes  Can read the correct time from a watch face?Yes  EOL planning:     Objective:   Today's Vitals   10/08/16 1041  BP: 116/64  Pulse: 76  Resp: 16  Temp: 98.2 F (36.8 C)  TempSrc: Temporal  Weight: 107 lb (48.5 kg)  Height: 5' 5.5" (1.664 m)   Body mass index is 17.53 kg/m.  General appearance: alert, no distress, WD/WN,  female HEENT:  normocephalic, sclerae anicteric, TMs pearly, nares patent, no discharge or erythema, pharynx normal Oral cavity: MMM, no lesions Neck: supple, no lymphadenopathy, no thyromegaly, no masses Heart: RRR, normal S1, S2, no murmurs Lungs: CTA bilaterally, no wheezes, rhonchi, or rales Abdomen: +bs, soft, non tender, non distended, no masses, no hepatomegaly, no splenomegaly Musculoskeletal: nontender, no swelling, no obvious deformity Extremities: no edema, no cyanosis, no clubbing Pulses: 2+ symmetric, upper and lower extremities, normal cap refill Neurological: alert, oriented x 3, CN2-12 intact, strength normal upper extremities and lower extremities, sensation normal throughout, DTRs 2+ throughout, no cerebellar signs, gait normal Psychiatric: normal affect, behavior normal, pleasant

## 2016-10-09 LAB — HEMOGLOBIN A1C
Hgb A1c MFr Bld: 5.5 %
Mean Plasma Glucose: 111 mg/dL

## 2016-10-20 ENCOUNTER — Encounter: Payer: Self-pay | Admitting: *Deleted

## 2016-11-05 DIAGNOSIS — M8589 Other specified disorders of bone density and structure, multiple sites: Secondary | ICD-10-CM | POA: Diagnosis not present

## 2016-11-06 ENCOUNTER — Encounter: Payer: Self-pay | Admitting: Internal Medicine

## 2016-11-06 ENCOUNTER — Other Ambulatory Visit: Payer: Self-pay | Admitting: Internal Medicine

## 2016-11-06 ENCOUNTER — Ambulatory Visit (INDEPENDENT_AMBULATORY_CARE_PROVIDER_SITE_OTHER): Payer: Medicare Other | Admitting: Internal Medicine

## 2016-11-06 VITALS — BP 124/62 | HR 84 | Temp 98.2°F | Resp 97 | Ht 65.5 in | Wt 108.0 lb

## 2016-11-06 DIAGNOSIS — J069 Acute upper respiratory infection, unspecified: Secondary | ICD-10-CM

## 2016-11-06 MED ORDER — PREDNISONE 20 MG PO TABS: ORAL_TABLET | ORAL | 0 refills | Status: DC

## 2016-11-06 MED ORDER — PROMETHAZINE-DM 6.25-15 MG/5ML PO SYRP: 5.0000 mL | ORAL_SOLUTION | Freq: Four times a day (QID) | ORAL | 0 refills | Status: DC | PRN

## 2016-11-06 MED ORDER — RANITIDINE HCL 300 MG PO TABS: 300.0000 mg | ORAL_TABLET | Freq: Every day | ORAL | 1 refills | Status: DC

## 2016-11-06 MED ORDER — AZITHROMYCIN 250 MG PO TABS
ORAL_TABLET | ORAL | 0 refills | Status: DC
Start: 2016-11-06 — End: 2016-11-20

## 2016-11-06 NOTE — Progress Notes (Signed)
HPI  Patient presents to the office for evaluation of cough.  It has been going on for 3 days.  Patient reports night > day, wet, worse with lying down, patient unaware of what she is bringing up bc she doesn't; want to look at it.  They also endorse change in voice, chills, fever, postnasal drip, sputum production, wheezing and congestion, minimal nasal drainage, post nasal drainage, sore throat, left ear pain.  .  They have tried tylenol and chloraceden.  They report that nothing has worked.  They denies other sick contacts.  Review of Systems  Constitutional: Positive for malaise/fatigue. Negative for chills and fever.  HENT: Positive for congestion, ear pain, hearing loss and sore throat.   Respiratory: Positive for cough. Negative for sputum production, shortness of breath and wheezing.   Cardiovascular: Negative for chest pain, palpitations and leg swelling.  Neurological: Positive for headaches.    PE:  Vitals:   11/06/16 1351  BP: 124/62  Pulse: 84  Resp: (!) 97  Temp: 98.2 F (36.8 C)   General:  Alert and non-toxic, WDWN, NAD HEENT: NCAT, PERLA, EOM normal, no occular discharge or erythema.  Nasal mucosal edema with sinus tenderness to palpation.  Oropharynx clear with minimal oropharyngeal edema and erythema.  Mucous membranes moist and pink. Neck:  Cervical adenopathy Chest:  RRR no MRGs.  Lungs clear to auscultation A&P with no wheezes rhonchi or rales.   Abdomen: +BS x 4 quadrants, soft, non-tender, no guarding, rigidity, or rebound. Skin: warm and dry no rash Neuro: A&Ox4, CN II-XII grossly intact  Assessment and Plan:    1. Acute URI -cont nasacort -cont nasal saline -zyrtec -ranitidine -zpak -prednisone -elevated head with sleeping -push fluids

## 2016-11-06 NOTE — Patient Instructions (Signed)
Please continue to take claritin, zyrtec, or allegra daily.  Please keep using nasacort two sprays per nostril at bedtime.  Please take prednisone as prescribed until it is gone.  Please take 1,000 mg of tyelnol three time daily for fevers and body aches.  Please take zpak until gone.  Please drink plenty of water.   Please use cough syrup 3 times daily to help with cough and congestion.  Please take 50 mg of benadryl at bedtime.

## 2016-11-09 ENCOUNTER — Telehealth: Payer: Self-pay | Admitting: Internal Medicine

## 2016-11-09 NOTE — Telephone Encounter (Signed)
still has fever, not improved from OV. Please advise patient. pham- [facility] [address]

## 2016-11-13 ENCOUNTER — Encounter: Payer: Self-pay | Admitting: Internal Medicine

## 2016-11-15 ENCOUNTER — Telehealth: Payer: Self-pay

## 2016-11-15 NOTE — Telephone Encounter (Signed)
Informed pt of bone density results  Per provider  Osteopenia Cont. Vit D & calcium  Wt bearing exercises  Recheck 2 years

## 2016-11-20 ENCOUNTER — Encounter: Payer: Self-pay | Admitting: Physician Assistant

## 2016-11-20 ENCOUNTER — Ambulatory Visit (INDEPENDENT_AMBULATORY_CARE_PROVIDER_SITE_OTHER): Payer: Medicare Other | Admitting: Physician Assistant

## 2016-11-20 ENCOUNTER — Ambulatory Visit (HOSPITAL_COMMUNITY)
Admission: RE | Admit: 2016-11-20 | Discharge: 2016-11-20 | Disposition: A | Discharge status: Home / Self Care | Payer: Medicare Other | Source: Ambulatory Visit | Attending: Physician Assistant | Admitting: Physician Assistant

## 2016-11-20 VITALS — BP 130/80 | HR 89 | Temp 97.5°F | Resp 14 | Ht 65.5 in | Wt 107.2 lb

## 2016-11-20 DIAGNOSIS — M545 Low back pain: Secondary | ICD-10-CM | POA: Diagnosis not present

## 2016-11-20 DIAGNOSIS — J989 Respiratory disorder, unspecified: Secondary | ICD-10-CM

## 2016-11-20 DIAGNOSIS — M5441 Lumbago with sciatica, right side: Secondary | ICD-10-CM

## 2016-11-20 DIAGNOSIS — M5136 Other intervertebral disc degeneration, lumbar region: Secondary | ICD-10-CM | POA: Insufficient documentation

## 2016-11-20 DIAGNOSIS — J069 Acute upper respiratory infection, unspecified: Secondary | ICD-10-CM | POA: Diagnosis not present

## 2016-11-20 MED ORDER — PREDNISONE 20 MG PO TABS: ORAL_TABLET | ORAL | 0 refills | Status: AC

## 2016-11-20 MED ORDER — BENZONATATE 100 MG PO CAPS: 200.0000 mg | ORAL_CAPSULE | Freq: Three times a day (TID) | ORAL | 0 refills | Status: DC | PRN

## 2016-11-20 MED ORDER — IPRATROPIUM-ALBUTEROL 0.5-2.5 (3) MG/3ML IN SOLN
3.0000 mL | Freq: Once | RESPIRATORY_TRACT | Status: AC
  Administered 2016-11-20: 3 mL via RESPIRATORY_TRACT

## 2016-11-20 MED ORDER — ALBUTEROL SULFATE HFA 108 (90 BASE) MCG/ACT IN AERS: 2.0000 | INHALATION_SPRAY | RESPIRATORY_TRACT | 0 refills | Status: DC | PRN

## 2016-11-20 MED ORDER — BUDESONIDE-FORMOTEROL FUMARATE 160-4.5 MCG/ACT IN AERO: 2.0000 | INHALATION_SPRAY | Freq: Two times a day (BID) | RESPIRATORY_TRACT | 0 refills | Status: DC

## 2016-11-20 NOTE — Progress Notes (Signed)
Subjective:    Patient ID: Debbie Houston, female    DOB: 09/10/1941, 76 y.o.   MRN: [id number]  HPI 76 y.o. WF presents with cough and back pain. She was seen 11/06/2016 for URI, given nasocort, zpak, prednisone. She still has cough, will have incontinence with her cough, she has pulled something in her lower back 1 week ago, hard to walk to due the pain and will catch with pain down her posterior leg. No fever, chills.   Blood pressure 130/80, pulse 89, temperature 97.5 F (36.4 C), resp. rate 14, height 5' 5.5" (1.664 m), weight 107 lb 3.2 oz (48.6 kg), SpO2 95 %.  Medications Current Outpatient Prescriptions on File Prior to Visit  Medication Sig  . aspirin 81 MG tablet Take 81 mg by mouth daily.  Marland Kitchen BIOTIN PO Take 1 tablet by mouth daily.  . bisoprolol-hydrochlorothiazide (ZIAC) 2.5-6.25 MG tablet TAKE 1 TABLET BY MOUTH DAILY  . budesonide (ENTOCORT EC) 3 MG 24 hr capsule Take 3 mg by mouth 3 (three) times daily as needed.   . Calcium Carb-Cholecalciferol (CALCIUM 600 + D PO) Take 1 tablet by mouth 2 (two) times daily.  . cetirizine (ZYRTEC) 10 MG tablet Take 10 mg by mouth daily.  . Cholecalciferol (VITAMIN D3) 2000 UNITS TABS Take 1 capsule by mouth daily.  . fish oil-omega-3 fatty acids 1000 MG capsule Take 1 g by mouth daily.   . Flaxseed, Linseed, (FLAX SEED OIL) 1000 MG CAPS Take 1,000 mg by mouth daily.  Marland Kitchen levothyroxine (SYNTHROID, LEVOTHROID) 125 MCG tablet TAKE 1 TABLET BY MOUTH DAILY BEFORE BREAKFAST  . Multiple Vitamins-Minerals (MULTIVITAMIN WITH MINERALS) tablet Take 1 tablet by mouth daily.  Marland Kitchen omeprazole (PRILOSEC) 20 MG capsule Take 20 mg by mouth daily.  . Omeprazole Magnesium (PRILOSEC OTC PO) Take by mouth daily.  . predniSONE (DELTASONE) 20 MG tablet 3 tabs po daily x 3 days, then 2 tabs x 3 days, then 1.5 tabs x 3 days, then 1 tab x 3 days, then 0.5 tabs x 3 days  . Probiotic Product (PROBIOTIC DAILY PO) Take 1 tablet by mouth daily.   . ranitidine (ZANTAC) 300  MG tablet TAKE 1 TABLET(300 MG) BY MOUTH AT BEDTIME  . simvastatin (ZOCOR) 20 MG tablet TAKE 1 TABLET BY MOUTH DAILY  . vitamin B-12 (CYANOCOBALAMIN) 1000 MCG tablet Take 1,000 mcg by mouth daily.  . vitamin C (ASCORBIC ACID) 500 MG tablet Take 500 mg by mouth daily.   No current facility-administered medications on file prior to visit.     Problem list She has Essential hypertension; Hyperlipidemia; Hypothyroid; GERD; Asthma; DJD (degenerative joint disease); Vitamin D deficiency; Osteopenia; Allergy; Anemia; Prediabetes; Medication management; Colitis; and Encounter for Medicare annual wellness exam on her problem list.  Review of Systems  Constitutional: Negative for chills, fatigue and fever.  HENT: Positive for postnasal drip and rhinorrhea. Negative for congestion, ear pain, sinus pressure, sore throat and voice change.   Respiratory: Positive for cough. Negative for chest tightness, shortness of breath and wheezing.   Cardiovascular: Negative.   Gastrointestinal: Negative.   Genitourinary: Negative.   Musculoskeletal: Positive for back pain and gait problem.  Skin: Negative.  Negative for rash.  Neurological: Negative for weakness.       Objective:   Physical Exam  Constitutional: She is oriented to person, place, and time. She appears well-developed and well-nourished. No distress.  HENT:  Head: Normocephalic.  Right Ear: A middle ear effusion is present.  Left Ear:  A middle ear effusion is present.  Nose: Mucosal edema present.  Mouth/Throat: Oropharynx is clear and moist. No oropharyngeal exudate.  Eyes: Conjunctivae are normal. No scleral icterus.  Neck: Normal range of motion. Neck supple. No JVD present. No thyromegaly present.  Cardiovascular: Normal rate, regular rhythm, normal heart sounds and intact distal pulses.  Exam reveals no gallop and no friction rub.   No murmur heard. Pulmonary/Chest: Effort normal. No respiratory distress. She has wheezes (diffuse). She  has no rales. She exhibits no tenderness.  Abdominal: Soft. Bowel sounds are normal. She exhibits no distension and no mass. There is no tenderness. There is no rebound and no guarding.  Musculoskeletal: Normal range of motion. She exhibits tenderness (right SI).  + right straight leg but with good distal strength, no decreased sensation, and good distal pulses.   Lymphadenopathy:    She has no cervical adenopathy.  Neurological: She is alert and oriented to person, place, and time.  Skin: Skin is warm and dry. She is not diaphoretic.  Psychiatric: She has a normal mood and affect. Her behavior is normal. Judgment and thought content normal.  Nursing note and vitals reviewed.      Assessment & Plan:  1. Acute URI Likely lingering cough/viral, no fever, chills, no SOB, wheezing Prednisone, albuterol, tessalon, symbicort - benzonatate (TESSALON PERLES) 100 MG capsule; Take 2 capsules (200 mg total) by mouth 3 (three) times daily as needed for cough (Max: 653m per day).  Dispense: 60 capsule; Refill: 0 - ipratropium-albuterol (DUONEB) 0.5-2.5 (3) MG/3ML nebulizer solution 3 mL; Take 3 mLs by nebulization once. - budesonide-formoterol (SYMBICORT) 160-4.5 MCG/ACT inhaler; Inhale 2 puffs into the lungs 2 (two) times daily.  Dispense: 1 Inhaler; Refill: 0 - albuterol (VENTOLIN HFA) 108 (90 Base) MCG/ACT inhaler; Inhale 2 puffs into the lungs every 4 (four) hours as needed for wheezing or shortness of breath.  Dispense: 1 Inhaler; Refill: 0  2. Acute right-sided low back pain with right-sided sciatica Likely from coughing, + straight leg, no weakness, numbness, incontinence, will go to ER if gets any of those symptoms otherwise get CXR and follow up ortho - predniSONE (DELTASONE) 20 MG tablet; 1 pill 3 x a day for 3 days, 1 pill 2 x a day x 3 days, 1 pill a day x 5 days with food  Dispense: 20 tablet; Refill: 0 - DG Lumbar Spine Complete; Future

## 2016-11-20 NOTE — Patient Instructions (Signed)
Call Dr. Raliegh Ip office for your lower back Do the prednisone Go to the ER if you have any numbness tingling between your thighs, bowel or urine incontinence, weakness in your legs.   Can do samples steroid inhaler if do not tolerate oral steroids, do 1 puff twice a day and wash mouth out afterwards to avoid yeast.    Common causes of cough OR hoarseness OR sore throat:   Allergies, Viral Infections, Acid Reflux and Bacterial Infections.  1) Allergies and viral infections cause a cough OR sore throat by post nasal drip and are often worse at night, can also have sneezing, lower grade fevers, clear/yellow mucus. This is best treated with allergy medications or nasal sprays.  Please get on allegra for 1-2 weeks The strongest is allegra or fexafinadine  Cheapest at [facility], [facility], [facility]   4) sometimes irritation causes more irritation. Try voice rest, use sugar free cough drops to prevent coughing, and try to stop clearing your throat.   If you ever have a cough that does not go away after trying these things please make a follow up visit for further evaluation or we can refer you to a specialist. Or if you ever have shortness of breath or chest pain go to the ER.

## 2016-11-26 DIAGNOSIS — M47816 Spondylosis without myelopathy or radiculopathy, lumbar region: Secondary | ICD-10-CM | POA: Diagnosis not present

## 2016-11-27 DIAGNOSIS — M545 Low back pain: Secondary | ICD-10-CM | POA: Diagnosis not present

## 2016-11-30 DIAGNOSIS — Z1231 Encounter for screening mammogram for malignant neoplasm of breast: Secondary | ICD-10-CM | POA: Diagnosis not present

## 2016-11-30 DIAGNOSIS — Z803 Family history of malignant neoplasm of breast: Secondary | ICD-10-CM | POA: Diagnosis not present

## 2016-12-04 DIAGNOSIS — M545 Low back pain: Secondary | ICD-10-CM | POA: Diagnosis not present

## 2016-12-06 DIAGNOSIS — M545 Low back pain: Secondary | ICD-10-CM | POA: Diagnosis not present

## 2016-12-11 DIAGNOSIS — M545 Low back pain: Secondary | ICD-10-CM | POA: Diagnosis not present

## 2016-12-13 DIAGNOSIS — M545 Low back pain: Secondary | ICD-10-CM | POA: Diagnosis not present

## 2016-12-15 ENCOUNTER — Other Ambulatory Visit: Payer: Self-pay | Admitting: Internal Medicine

## 2016-12-15 DIAGNOSIS — I10 Essential (primary) hypertension: Secondary | ICD-10-CM

## 2016-12-18 DIAGNOSIS — M545 Low back pain: Secondary | ICD-10-CM | POA: Diagnosis not present

## 2016-12-20 DIAGNOSIS — M545 Low back pain: Secondary | ICD-10-CM | POA: Diagnosis not present

## 2016-12-24 DIAGNOSIS — M545 Low back pain: Secondary | ICD-10-CM | POA: Diagnosis not present

## 2016-12-31 ENCOUNTER — Encounter: Payer: Self-pay | Admitting: Physician Assistant

## 2017-01-03 DIAGNOSIS — H5202 Hypermetropia, left eye: Secondary | ICD-10-CM | POA: Diagnosis not present

## 2017-01-03 DIAGNOSIS — H5211 Myopia, right eye: Secondary | ICD-10-CM | POA: Diagnosis not present

## 2017-01-03 DIAGNOSIS — H2513 Age-related nuclear cataract, bilateral: Secondary | ICD-10-CM | POA: Diagnosis not present

## 2017-01-03 DIAGNOSIS — H52223 Regular astigmatism, bilateral: Secondary | ICD-10-CM | POA: Diagnosis not present

## 2017-01-09 ENCOUNTER — Ambulatory Visit (INDEPENDENT_AMBULATORY_CARE_PROVIDER_SITE_OTHER): Payer: Medicare Other | Admitting: Physician Assistant

## 2017-01-09 ENCOUNTER — Encounter: Payer: Self-pay | Admitting: Physician Assistant

## 2017-01-09 VITALS — BP 122/68 | HR 86 | Temp 98.1°F | Resp 16 | Ht 65.5 in | Wt 111.2 lb

## 2017-01-09 DIAGNOSIS — I10 Essential (primary) hypertension: Secondary | ICD-10-CM | POA: Diagnosis not present

## 2017-01-09 DIAGNOSIS — E559 Vitamin D deficiency, unspecified: Secondary | ICD-10-CM

## 2017-01-09 DIAGNOSIS — M1990 Unspecified osteoarthritis, unspecified site: Secondary | ICD-10-CM | POA: Diagnosis not present

## 2017-01-09 DIAGNOSIS — M8589 Other specified disorders of bone density and structure, multiple sites: Secondary | ICD-10-CM | POA: Diagnosis not present

## 2017-01-09 DIAGNOSIS — R7303 Prediabetes: Secondary | ICD-10-CM

## 2017-01-09 DIAGNOSIS — K2100 Gastro-esophageal reflux disease with esophagitis, without bleeding: Secondary | ICD-10-CM

## 2017-01-09 DIAGNOSIS — E782 Mixed hyperlipidemia: Secondary | ICD-10-CM

## 2017-01-09 DIAGNOSIS — K529 Noninfective gastroenteritis and colitis, unspecified: Secondary | ICD-10-CM | POA: Diagnosis not present

## 2017-01-09 DIAGNOSIS — D649 Anemia, unspecified: Secondary | ICD-10-CM

## 2017-01-09 DIAGNOSIS — J45909 Unspecified asthma, uncomplicated: Secondary | ICD-10-CM

## 2017-01-09 DIAGNOSIS — Z79899 Other long term (current) drug therapy: Secondary | ICD-10-CM

## 2017-01-09 DIAGNOSIS — R21 Rash and other nonspecific skin eruption: Secondary | ICD-10-CM

## 2017-01-09 DIAGNOSIS — E039 Hypothyroidism, unspecified: Secondary | ICD-10-CM | POA: Diagnosis not present

## 2017-01-09 DIAGNOSIS — T7840XA Allergy, unspecified, initial encounter: Secondary | ICD-10-CM

## 2017-01-09 LAB — LIPID PANEL
CHOL/HDL RATIO: 3.1 ratio (ref <5.0)
CHOLESTEROL: 178 mg/dL (ref <200)
HDL: 57 mg/dL (ref >50)
LDL Cholesterol: 105 mg/dL — ABNORMAL HIGH (ref <100)
Triglycerides: 81 mg/dL (ref <150)
VLDL: 16 mg/dL (ref <30)

## 2017-01-09 LAB — CBC WITH DIFFERENTIAL/PLATELET
BASOS PCT: 0 %
Basophils Absolute: 0 cells/uL (ref 0–200)
EOS ABS: 77 {cells}/uL (ref 15–500)
Eosinophils Relative: 1 %
HEMATOCRIT: 39.5 % (ref 35.0–45.0)
HEMOGLOBIN: 13.1 g/dL (ref 11.7–15.5)
LYMPHS PCT: 22 %
Lymphs Abs: 1694 cells/uL (ref 850–3900)
MCH: 30.8 pg (ref 27.0–33.0)
MCHC: 33.2 g/dL (ref 32.0–36.0)
MCV: 92.9 fL (ref 80.0–100.0)
MONO ABS: 385 {cells}/uL (ref 200–950)
MPV: 10.7 fL (ref 7.5–12.5)
Monocytes Relative: 5 %
Neutro Abs: 5544 cells/uL (ref 1500–7800)
Neutrophils Relative %: 72 %
Platelets: 181 10*3/uL (ref 140–400)
RBC: 4.25 MIL/uL (ref 3.80–5.10)
RDW: 15 % (ref 11.0–15.0)
WBC: 7.7 10*3/uL (ref 3.8–10.8)

## 2017-01-09 LAB — BASIC METABOLIC PANEL WITH GFR
BUN: 14 mg/dL (ref 7–25)
CHLORIDE: 103 mmol/L (ref 98–110)
CO2: 27 mmol/L (ref 20–31)
Calcium: 9.3 mg/dL (ref 8.6–10.4)
Creat: 0.94 mg/dL — ABNORMAL HIGH (ref 0.60–0.93)
GFR, Est African American: 69 mL/min (ref >=60)
GFR, Est Non African American: 60 mL/min (ref >=60)
GLUCOSE: 113 mg/dL — AB (ref 65–99)
Potassium: 4.1 mmol/L (ref 3.5–5.3)
Sodium: 137 mmol/L (ref 135–146)

## 2017-01-09 LAB — HEPATIC FUNCTION PANEL
ALT: 17 U/L (ref 6–29)
AST: 20 U/L (ref 10–35)
Albumin: 4 g/dL (ref 3.6–5.1)
Alkaline Phosphatase: 52 U/L (ref 33–130)
BILIRUBIN DIRECT: 0.1 mg/dL (ref <=0.2)
BILIRUBIN INDIRECT: 0.2 mg/dL (ref 0.2–1.2)
BILIRUBIN TOTAL: 0.3 mg/dL (ref 0.2–1.2)
Total Protein: 6 g/dL — ABNORMAL LOW (ref 6.1–8.1)

## 2017-01-09 LAB — TSH: TSH: 0.77 m[IU]/L

## 2017-01-09 LAB — IRON AND TIBC
%SAT: 27 % (ref 11–50)
Iron: 95 ug/dL (ref 45–160)
TIBC: 347 ug/dL (ref 250–450)
UIBC: 252 ug/dL (ref 125–400)

## 2017-01-09 LAB — VITAMIN B12: Vitamin B-12: 579 pg/mL (ref 200–1100)

## 2017-01-09 MED ORDER — DEXAMETHASONE SODIUM PHOSPHATE 100 MG/10ML IJ SOLN: 10.0000 mg | Freq: Once | INTRAMUSCULAR | Status: DC

## 2017-01-09 NOTE — Patient Instructions (Addendum)
Tumeric with black pepper extract is a great natural antiinflammatory that helps with arthritis and aches and pain. Can get from [facility] or any health food store. Need to take at least 834m twice a day with food.    Pityriasis Rosea Introduction Pityriasis rosea is a rash that usually appears on the trunk of the body. It may also appear on the upper arms and upper legs. It usually begins as a single patch, and then more patches begin to develop. The rash may cause mild itching, but it normally does not cause other problems. It usually goes away without treatment. However, it may take weeks or months for the rash to go away completely. What increases the risk? This condition is more likely to develop in young adults and children.  What are the signs or symptoms? The main symptom of this condition is a rash.  The rash usually begins with a single oval patch that is larger than the ones that follow. This is called a herald patch. It generally appears a week or more before the rest of the rash appears.  When more patches start to develop, they spread quickly on the trunk, back, and arms. These patches are smaller than the first one.  The patches that make up the rash are usually oval-shaped and pink or red in color. They are usually flat, but they may sometimes be raised so that they can be felt with a finger. They may also be finely crinkled and have a scaly ring around the edge.  The rash does not typically appear on areas of the skin that are exposed to the sun. Most people who have this condition do not have other symptoms, but some have mild itching. In a few cases, a mild headache or body aches may occur before the rash appears and then go away. How is this diagnosed? Your health care provider may diagnose this condition by doing a physical exam and taking your medical history. To rule out other possible causes for the rash, the health care provider may order blood tests or take a skin sample  from the rash to be looked at under a microscope. How is this treated? Usually, treatment is not needed for this condition. The rash will probably go away on its own in 4-8 weeks. In some cases, a health care provider may recommend or prescribe medicine to reduce itching. Follow these instructions at home:  Take medicines only as directed by your health care provider.  Avoid scratching the affected areas of skin.  Do not take hot baths or use a sauna. Use only warm water when bathing or showering. Heat can increase itching. Contact a health care provider if:  Your rash does not go away in 8 weeks.  Your rash gets much worse.  You have a fever.  You have swelling or pain in the rash area.  You have fluid, blood, or pus coming from the rash area. This information is not intended to replace advice given to you by your health care provider. Make sure you discuss any questions you have with your health care provider. Document Released: 12/12/2001 Document Revised: 04/12/2016 Document Reviewed: 10/13/2014  2017 [employer]vier  Your ears and sinuses are connected by the eustachian tube. When your sinuses are inflamed, this can close off the tube and cause fluid to collect in your middle ear. This can then cause dizziness, popping, clicking, ringing, and echoing in your ears. This is often NOT an infection and does NOT require antibiotics,  it is caused by inflammation so the treatments help the inflammation. This can take a long time to get better so please be patient.  Here are things you can do to help with this: - Try the Flonase or Nasonex. Remember to spray each nostril twice towards the outer part of your eye.  Do not sniff but instead pinch your nose and tilt your head back to help the medicine get into your sinuses.  The best time to do this is at bedtime.Stop if you get blurred vision or nose bleeds.  -While drinking fluids, pinch and hold nose close and swallow, to help open eustachian tubes  to drain fluid behind ear drums. -Please pick one of the over the counter allergy medications below and take it once daily for allergies.  It will also help with fluid behind ear drums. Claritin or loratadine cheapest but likely the weakest  Zyrtec or certizine at night because it can make you sleepy The strongest is allegra or fexafinadine  Cheapest at [facility], [facility], [facility] -can use decongestant over the counter, please do not use if you have high blood pressure or certain heart conditions.   if worsening HA, changes vision/speech, imbalance, weakness go to the ER  GETTING OFF OF PPI's    Nexium/protonix/prilosec/Omeprazole/Dexilant/Aciphex are called PPI's, they are great at healing your stomach but should only be taken for a short period of time.     Recent studies have shown that taken for a long time they  can increase the risk of osteoporosis (weakening of your bones), pneumonia, low magnesium, restless legs, Cdiff (infection that causes diarrhea), DEMENTIA and most recently kidney damage / disease / insufficiency.     Due to this information we want to try to stop the PPI but if you try to stop it abruptly this can cause rebound acid and worsening symptoms.   So this is how we want you to get off the PPI:  - Start taking the nexium/protonix/prilosec/PPI  every other day with  zantac (ranitidine) 2 x a day for 2-4 weeks - some people stay on this dosage and can not taper off further. Our main goal is to limit the dosage and amount you are taking so if you need to stay on this dose.   - then decrease the PPI to every 3 days while taking the zantac (ranitidine) 381m twice a day the other  days for 2-4  Weeks  - then you can try the zantac (ranitidine) 3071monce at night or up to 2 x day as needed.  - you can continue on this once at night or stop all together  - Avoid alcohol, spicy foods, NSAIDS (aleve, ibuprofen) at this time. See foods below.    +++++++++++++++++++++++++++++++++++++++++++  Food Choices for Gastroesophageal Reflux Disease  When you have gastroesophageal reflux disease (GERD), the foods you eat and your eating habits are very important. Choosing the right foods can help ease the discomfort of GERD. WHAT GENERAL GUIDELINES DO I NEED TO FOLLOW?  Choose fruits, vegetables, whole grains, low-fat dairy products, and low-fat meat, fish, and poultry.  Limit fats such as oils, salad dressings, butter, nuts, and avocado.  Keep a food diary to identify foods that cause symptoms.  Avoid foods that cause reflux. These may be different for different people.  Eat frequent small meals instead of three large meals each day.  Eat your meals slowly, in a relaxed setting.  Limit fried foods.  Cook foods using methods other than frying.  Avoid drinking alcohol.  Avoid drinking large amounts of liquids with your meals.  Avoid bending over or lying down until 2-3 hours after eating.   WHAT FOODS ARE NOT RECOMMENDED? The following are some foods and drinks that may worsen your symptoms:  Vegetables Tomatoes. Tomato juice. Tomato and spaghetti sauce. Chili peppers. Onion and garlic. Horseradish. Fruits Oranges, grapefruit, and lemon (fruit and juice). Meats High-fat meats, fish, and poultry. This includes hot dogs, ribs, ham, sausage, salami, and bacon. Dairy Whole milk and chocolate milk. Sour cream. Cream. Butter. Ice cream. Cream cheese.  Beverages Coffee and tea, with or without caffeine. Carbonated beverages or energy drinks. Condiments Hot sauce. Barbecue sauce.  Sweets/Desserts Chocolate and cocoa. Donuts. Peppermint and spearmint. Fats and Oils High-fat foods, including [place] fries and potato chips. Other Vinegar. Strong spices, such as black pepper, white pepper, red pepper, cayenne, curry powder, cloves, ginger, and chili powder. Nexium/protonix/prilosec are called PPI's, they are great at healing  your stomach but should only be taken for a short period of time.

## 2017-01-09 NOTE — Progress Notes (Signed)
Complete Physical  Assessment and Plan:  Essential hypertension - CBC with Differential/Platelet - BASIC METABOLIC PANEL WITH GFR - Hepatic function panel - Urinalysis, Routine w reflex microscopic - Microalbumin / creatinine urine ratio - EKG 12-Lead  Hypothyroidism, unspecified hypothyroidism type - TSH  Hyperlipidemia - Lipid panel   Prediabetes Discussed general issues about diabetes pathophysiology and management., Educational material distributed., Suggested low cholesterol diet., Encouraged aerobic exercise., Discussed foot care., Reminded to get yearly retinal exam. - Hemoglobin A1c  Asthma, unspecified asthma severity, uncomplicated controlled  GERD Continue PPI/H2 blocker, diet discussed   Arthritis Treat PRN  Osteoarthritis, unspecified osteoarthritis type, unspecified site Aleve PRN   Osteopenia Osteopenia- get dexa in DEC, continue Vit D and Ca, weight bearing exercises   Vitamin D deficiency - Vit D  25 hydroxy (rtn osteoporosis monitoring)  Allergy, initial encounter Continue OTC allergy pills  Anemia, unspecified anemia type - Iron and TIBC - Ferritin - Folate RBC   Medication management - Magnesium   Colitis Continue follow up Dr. Watt Climes  Rash Likely pityriasis rosea- dexamethasone shot, benadryl call if not better 4 weeks  Discussed med's effects and SE's. Screening labs and tests as requested with regular follow-up as recommended Future Appointments Date Time Provider [place]  05/03/2017 11:15 AM Unk Pinto, MD [facility] None  08/27/2017 10:30 AM Vicie Mutters, PA-C [facility] None  01/09/2018 2:00 PM Vicie Mutters, PA-C [facility] None     HPI  76 y.o. female  presents for a complete physical.  Has had rash x 3 weeks, no new soaps, detergents, has had new start ziac 3 months ago, very itching.  Her blood pressure has been controlled at home, she is on 1/2 of BP med, today their BP is BP: 122/68 She does workout.  She denies chest pain, shortness of breath, dizziness.  She is on cholesterol medication, zocor 58m started to take every other day and denies myalgias. Her cholesterol is at goal. The cholesterol last visit was:   Lab Results  Component Value Date   CHOL 165 10/08/2016   HDL 68 10/08/2016   LDLCALC 85 10/08/2016   TRIG 58 10/08/2016   CHOLHDL 2.4 10/08/2016   She has been working on diet and exercise for prediabetes,  and denies paresthesia of the feet, polydipsia, polyuria and visual disturbances. Last A1C in the office was:  Lab Results  Component Value Date   HGBA1C 5.5 10/08/2016   Patient is on Vitamin D supplement.   Lab Results  Component Value Date   VD25OH 61905/17/2017     She follows with Dr. MWatt Climes she is on entocort 329mhas been taking 2 a day, she has been having some diarrhea, normal colonoscopy 09/2014. She also complains of bilateral feet cramping and decreased sensation. . Marland KitchenShe was on tamoxifen for osteopenia, off due to endometrial hyperplasia, last DEXA 2015, osteopenia, no change.  She is on thyroid medication. Her medication was not changed last visit.   Lab Results  Component Value Date   TSH 0.88 10/08/2016  .   Current Medications:  Current Outpatient Prescriptions on File Prior to Visit  Medication Sig Dispense Refill  . aspirin 81 MG tablet Take 81 mg by mouth daily.    . Marland KitchenIOTIN PO Take 1 tablet by mouth daily.    . bisoprolol-hydrochlorothiazide (ZIAC) 2.5-6.25 MG tablet TAKE 1 TABLET BY MOUTH EVERY DAY 90 tablet 1  . budesonide (ENTOCORT EC) 3 MG 24 hr capsule Take 3 mg by mouth 3 (three) times  daily as needed.     . Calcium Carb-Cholecalciferol (CALCIUM 600 + D PO) Take 1 tablet by mouth 2 (two) times daily.    . cetirizine (ZYRTEC) 10 MG tablet Take 10 mg by mouth daily.    . Cholecalciferol (VITAMIN D3) 2000 UNITS TABS Take 1 capsule by mouth daily.    . fish oil-omega-3 fatty acids 1000 MG capsule Take 1 g by mouth daily.     . Flaxseed,  Linseed, (FLAX SEED OIL) 1000 MG CAPS Take 1,000 mg by mouth daily.    Marland Kitchen levothyroxine (SYNTHROID, LEVOTHROID) 125 MCG tablet TAKE 1 TABLET BY MOUTH DAILY BEFORE BREAKFAST 90 tablet 1  . Multiple Vitamins-Minerals (MULTIVITAMIN WITH MINERALS) tablet Take 1 tablet by mouth daily.    Marland Kitchen omeprazole (PRILOSEC) 20 MG capsule Take 20 mg by mouth daily.    . Omeprazole Magnesium (PRILOSEC OTC PO) Take by mouth daily.    . Probiotic Product (PROBIOTIC DAILY PO) Take 1 tablet by mouth daily.     . simvastatin (ZOCOR) 20 MG tablet TAKE 1 TABLET BY MOUTH DAILY 90 tablet 1  . vitamin C (ASCORBIC ACID) 500 MG tablet Take 500 mg by mouth daily.     No current facility-administered medications on file prior to visit.    Health Maintenance:   Immunization History  Administered Date(s) Administered  . Influenza, High Dose Seasonal PF 07/23/2014, 08/23/2015, 07/10/2016  . Pneumococcal Conjugate-13 05/05/2015  . Pneumococcal-Unspecified 08/22/2011  . Tdap 08/22/2011  . Zoster 06/20/2006   Last colonoscopy: Dr. Watt Climes  09/2014 EGD: 09/2014, scheduled for Nov Last mammogram 3D: 10/2016 at [place] Last pap smear/pelvic exam: 2012 DEXA:09/2016 osteopenia US pelvis/AB- + polyp- 07/2014 saw GYN had normal BX CXR 12/2013 Cervical Xray 05/2014 MRI brain 12/2014  Prior vaccinations: TD or Tdap: 2012 Influenza: 08/2016 Pneumococcal: 2012 Prevnar 13: 2016 Shingles/Zostavax: 2007  Patient Care Team: Unk Pinto, MD as PCP - General (Internal Medicine)  Medical History:  Past Medical History:  Diagnosis Date  . Arthritis   . Benign labile hypertension   . Colitis   . DJD (degenerative joint disease)   . Endometrial polyp   . GERD (gastroesophageal reflux disease)   . History of basal cell carcinoma excision   . History of esophagitis   . Hyperlipidemia   . Hypothyroidism   . Osteopenia   . Prediabetes   . Vitamin D deficiency   . Wears glasses    Allergies Allergies  Allergen  Reactions  . Fosamax [Alendronate Sodium] Other (See Comments)    Gi uPSET  . Singulair [Montelukast Sodium] Other (See Comments)    JITTERS  . Wellbutrin [Bupropion] Hives  . Sulfa Antibiotics Rash    SURGICAL HISTORY She  has a past surgical history that includes Mohs surgery (2005); Tonsillectomy (age 30); Dilation and curettage of uterus; and Dilatation & curettage/hysteroscopy with myosure (N/A, 10/27/2014). FAMILY HISTORY Her family history includes Cancer in her father, maternal grandmother, mother, and sister; Diabetes in her brother and maternal grandfather; Hyperlipidemia in her brother, father, maternal grandfather, maternal grandmother, mother, paternal grandfather, paternal grandmother, and sister; Hypertension in her mother; Stroke in her maternal grandfather. SOCIAL HISTORY She  reports that she has been smoking Cigarettes.  She has smoked for the past 15.00 years. She has never used smokeless tobacco. She reports that she drinks alcohol. She reports that she does not use drugs.  Review of Systems: Review of Systems  Constitutional: Negative for chills, diaphoresis, fever, malaise/fatigue and weight loss.  HENT: Negative for  congestion, ear discharge, ear pain, hearing loss, nosebleeds, sore throat and tinnitus.   Eyes: Negative.   Respiratory: Negative.  Negative for stridor.   Cardiovascular: Negative.   Gastrointestinal: Negative for abdominal pain, blood in stool, constipation, diarrhea, heartburn, melena, nausea and vomiting.  Genitourinary: Negative.   Musculoskeletal: Positive for neck pain. Negative for back pain, falls, joint pain and myalgias.  Skin: Positive for itching and rash (back and arms x 3 weeks).  Neurological: Positive for sensory change. Negative for dizziness, tingling, tremors, speech change, focal weakness, seizures, loss of consciousness, weakness and headaches.  Psychiatric/Behavioral: Negative.     Physical Exam: Estimated body mass index is  18.22 kg/m as calculated from the following:   Height as of this encounter: 5' 5.5" (1.664 m).   Weight as of this encounter: 111 lb 3.2 oz (50.4 kg). BP 122/68   Pulse 86   Temp 98.1 F (36.7 C)   Resp 16   Ht 5' 5.5" (1.664 m)   Wt 111 lb 3.2 oz (50.4 kg)   SpO2 97%   BMI 18.22 kg/m  General Appearance: Well nourished, in no apparent distress.  Eyes: PERRLA, EOMs, conjunctiva no swelling or erythema, normal fundi and vessels.  Sinuses: No Frontal/maxillary tenderness  ENT/Mouth: Ext aud canals clear, normal light reflex with TMs without erythema, bulging. Good dentition. No erythema, swelling, or exudate on post pharynx. Tonsils not swollen or erythematous. Hearing normal.  Neck: Supple, thyroid normal. No bruits  Respiratory: Respiratory effort normal, BS equal bilaterally without rales, rhonchi, wheezing or stridor.  Cardio: RRR without murmurs, rubs or gallops. Brisk peripheral pulses without edema.  Chest: symmetric, with normal excursions and percussion.  Breasts: Symmetric, without lumps, nipple discharge, retractions.  Abdomen: Soft, nontender, no guarding, rebound, hernias, masses, or organomegaly. .  Lymphatics: Non tender without lymphadenopathy.  Genitourinary: defer Musculoskeletal: Full ROM all peripheral extremities,5/5 strength, and normal gait.  Skin: flat nodules erythematous on back, arms, and trunk. Warm, dry without rashes, lesions, ecchymosis. Neuro: Cranial nerves intact, reflexes equal bilaterally. Normal muscle tone, no cerebellar symptoms. Sensation intact.  Psych: Awake and oriented X 3, normal affect, Insight and Judgment appropriate.   EKG: WNL no changes. AORTA SCAN: defer  Vicie Mutters 4:07 PM [facility]

## 2017-01-10 LAB — VITAMIN D 25 HYDROXY (VIT D DEFICIENCY, FRACTURES): Vit D, 25-Hydroxy: 65 ng/mL (ref 30–100)

## 2017-01-10 LAB — MAGNESIUM: Magnesium: 1.8 mg/dL (ref 1.5–2.5)

## 2017-01-14 DIAGNOSIS — I83813 Varicose veins of bilateral lower extremities with pain: Secondary | ICD-10-CM | POA: Diagnosis not present

## 2017-01-16 DIAGNOSIS — I83813 Varicose veins of bilateral lower extremities with pain: Secondary | ICD-10-CM | POA: Diagnosis not present

## 2017-02-18 ENCOUNTER — Other Ambulatory Visit: Payer: Self-pay | Admitting: Internal Medicine

## 2017-02-18 ENCOUNTER — Encounter: Payer: Self-pay | Admitting: *Deleted

## 2017-02-25 ENCOUNTER — Encounter: Payer: Self-pay | Admitting: *Deleted

## 2017-03-05 DIAGNOSIS — H18413 Arcus senilis, bilateral: Secondary | ICD-10-CM | POA: Diagnosis not present

## 2017-03-05 DIAGNOSIS — H2511 Age-related nuclear cataract, right eye: Secondary | ICD-10-CM | POA: Diagnosis not present

## 2017-03-05 DIAGNOSIS — H25043 Posterior subcapsular polar age-related cataract, bilateral: Secondary | ICD-10-CM | POA: Diagnosis not present

## 2017-03-05 DIAGNOSIS — H25013 Cortical age-related cataract, bilateral: Secondary | ICD-10-CM | POA: Diagnosis not present

## 2017-03-05 DIAGNOSIS — H2513 Age-related nuclear cataract, bilateral: Secondary | ICD-10-CM | POA: Diagnosis not present

## 2017-03-07 ENCOUNTER — Ambulatory Visit (INDEPENDENT_AMBULATORY_CARE_PROVIDER_SITE_OTHER): Payer: Medicare Other | Admitting: Physician Assistant

## 2017-03-07 ENCOUNTER — Encounter: Payer: Self-pay | Admitting: Physician Assistant

## 2017-03-07 VITALS — BP 110/68 | HR 70 | Temp 97.7°F | Resp 14 | Ht 65.5 in | Wt 109.8 lb

## 2017-03-07 DIAGNOSIS — R202 Paresthesia of skin: Secondary | ICD-10-CM | POA: Diagnosis not present

## 2017-03-07 DIAGNOSIS — R630 Anorexia: Secondary | ICD-10-CM

## 2017-03-07 DIAGNOSIS — M542 Cervicalgia: Secondary | ICD-10-CM

## 2017-03-07 LAB — CBC WITH DIFFERENTIAL/PLATELET
Basophils Absolute: 0 cells/uL (ref 0–200)
Basophils Relative: 0 %
EOS PCT: 2 %
Eosinophils Absolute: 156 cells/uL (ref 15–500)
HEMATOCRIT: 40.1 % (ref 35.0–45.0)
Hemoglobin: 13.4 g/dL (ref 11.7–15.5)
LYMPHS PCT: 24 %
Lymphs Abs: 1872 cells/uL (ref 850–3900)
MCH: 31.1 pg (ref 27.0–33.0)
MCHC: 33.4 g/dL (ref 32.0–36.0)
MCV: 93 fL (ref 80.0–100.0)
MONOS PCT: 6 %
MPV: 10.8 fL (ref 7.5–12.5)
Monocytes Absolute: 468 cells/uL (ref 200–950)
Neutro Abs: 5304 cells/uL (ref 1500–7800)
Neutrophils Relative %: 68 %
Platelets: 198 10*3/uL (ref 140–400)
RBC: 4.31 MIL/uL (ref 3.80–5.10)
RDW: 14.7 % (ref 11.0–15.0)
WBC: 7.8 10*3/uL (ref 3.8–10.8)

## 2017-03-07 LAB — HEPATIC FUNCTION PANEL
ALT: 16 U/L (ref 6–29)
AST: 22 U/L (ref 10–35)
Albumin: 3.9 g/dL (ref 3.6–5.1)
Alkaline Phosphatase: 51 U/L (ref 33–130)
BILIRUBIN INDIRECT: 0.2 mg/dL (ref 0.2–1.2)
Bilirubin, Direct: 0.1 mg/dL (ref <=0.2)
TOTAL PROTEIN: 5.9 g/dL — AB (ref 6.1–8.1)
Total Bilirubin: 0.3 mg/dL (ref 0.2–1.2)

## 2017-03-07 LAB — BASIC METABOLIC PANEL WITH GFR
BUN: 16 mg/dL (ref 7–25)
CALCIUM: 9.6 mg/dL (ref 8.6–10.4)
CO2: 25 mmol/L (ref 20–31)
Chloride: 104 mmol/L (ref 98–110)
Creat: 0.89 mg/dL (ref 0.60–0.93)
GFR, EST AFRICAN AMERICAN: 73 mL/min (ref >=60)
GFR, EST NON AFRICAN AMERICAN: 64 mL/min (ref >=60)
GLUCOSE: 104 mg/dL — AB (ref 65–99)
Potassium: 4 mmol/L (ref 3.5–5.3)
SODIUM: 138 mmol/L (ref 135–146)

## 2017-03-07 MED ORDER — AMITRIPTYLINE HCL 10 MG PO TABS
10.0000 mg | ORAL_TABLET | Freq: Every evening | ORAL | 1 refills | Status: DC | PRN
Start: 2017-03-07 — End: 2017-04-19

## 2017-03-07 NOTE — Patient Instructions (Signed)
You can take tylenol (576m) or tylenol arthritis (6595m with the meloxicam/antiinflammatories. The max you can take of tylenol a day is 300041maily, this is a max of 6 pills a day of the regular tyelnol (500m20mr a max of 4 a day of the tylenol arthritis (650mg2m long as no other medications you are taking contain tylenol.   Or can get amitriptyline 1-2 at night for pain  Will send for dry needling  Any worsening symptoms or change in symptoms call the office.   if worsening HA, changes vision/speech, imbalance, weakness go to the ER   Trigger Point Dry Needling   What is Trigger Point Dry Needling (DN)?   1. DN is a physical therapy technique used to treat muscle pain and     Dysfunction.  Specifically, DN helps deactivate muscle trigger    points (Muscle Knots).   2. A thin filiform needle is used to penetrate the skin and stimulate    the underlying trigger point.  The goal is for a local twitch     response (LTR) to occur and for the trigger point to relax.  No    medication of any kind is injected during the procedure.   What Does Trigger Point Dry Needling Feel Like?   1. The procedures feels different for each individual patient.   Some    patients report that they do not actually feel the      needle enter the skin and overall the process is not  painful.  Very    mild bleeding may occur.  However, many patients feel a deep    cramping in the muscle in which the needle was inserted. This is t   he local twitch response.    How Will I Feel After The Treatment?   1. Soreness is normal, and the onset of soreness may not occur for    a few hours.  Typically this soreness does not last longer than two    days.   2. Bruising is uncommon, however; ice can be used to decrease any    possible bruising.   3. In rare cases feeling tired or nauseous after the treatment is    normal.  In addition, your symptoms may get worse before they    get better, this period will typically not last  longer than 24 hours.   What Can I do After My Treatment?   1.  Increase your hydration by drinking more water for the next 24    hours.   2.  You may place ice or heat on the areas treated that have become    sore, however don not use heat on inflamed or bruised areas.     Heat often brings more relief post needling.   3. You can continue your regular activities, but vigorous activity is    not recommended initially after the treatment for 24 hours.   4. DN is best combined with other physical therapy such as     strengthening, stretching, and other therapies.    What is the TMJ? The temporomandibular (tem-PUH-ro-man-DIB-yoo-ler) joint, or the TMJ, connects the upper and lower jawbones. This joint allows the jaw to open wide and move back and forth when you chew, talk, or yawn.There are also several muscles that help this joint move. There can be muscle tightness and pain in the muscle that can cause several symptoms.  What causes TMJ pain? There are many causes of TMJ pain.  Repeated chewing (for example, chewing gum) and clenching your teeth can cause pain in the joint. Some TMJ pain has no obvious cause. What can I do to ease the pain? There are many things you can do to help your pain get better. When you have pain:  Eat soft foods and stay away from chewy foods (for example, taffy) Try to use both sides of your mouth to chew Don't chew gum Massage Don't open your mouth wide (for example, during yawning or singing) Don't bite your cheeks or fingernails Lower your amount of stress and worry Applying a warm, damp washcloth to the joint may help. Over-the-counter pain medicines such as ibuprofen (one brand: Advil) or acetaminophen (one brand: Tylenol) might also help. Do not use these medicines if you are allergic to them or if your doctor told you not to use them. How can I stop the pain from coming back? When your pain is better, you can do these exercises to make your muscles stronger  and to keep the pain from coming back:  Resisted mouth opening: Place your thumb or two fingers under your chin and open your mouth slowly, pushing up lightly on your chin with your thumb. Hold for three to six seconds. Close your mouth slowly. Resisted mouth closing: Place your thumbs under your chin and your two index fingers on the ridge between your mouth and the bottom of your chin. Push down lightly on your chin as you close your mouth. Tongue up: Slowly open and close your mouth while keeping the tongue touching the roof of the mouth. Side-to-side jaw movement: Place an object about one fourth of an inch thick (for example, two tongue depressors) between your front teeth. Slowly move your jaw from side to side. Increase the thickness of the object as the exercise becomes easier Forward jaw movement: Place an object about one fourth of an inch thick between your front teeth and move the bottom jaw forward so that the bottom teeth are in front of the top teeth. Increase the thickness of the object as the exercise becomes easier. These exercises should not be painful. If it hurts to do these exercises, stop doing them and talk to your family doctor.

## 2017-03-07 NOTE — Progress Notes (Signed)
Subjective:    Patient ID: Debbie Houston, female    DOB: 21-Jun-1941, 76 y.o.   MRN: [id number]  HPI 76 y.o. WF presents with right head/neck pain x 1 week. No injury.  She also complains of numbness leg and feet, bilateral worse right leg. Has lower back pain, has been seeing orthopedic. Notices more when she is driving with her right leg. She has had decreased appetite x christmas. She has had a lot of diarrhea. No NSAIDS, no tylenol. She has been on zantac x several days, not helped at this time with diarrhea. May need to follow up with GI  Normal MRI brain 2016 normal Cervical 05/2014 Lumbar 2018  BMI is Body mass index is 17.99 kg/m. some weight loss since last visit, 3 lbs.  Wt Readings from Last 3 Encounters:  03/07/17 109 lb 12.8 oz (49.8 kg)  01/09/17 111 lb 3.2 oz (50.4 kg)  11/20/16 107 lb 3.2 oz (48.6 kg)   She is on thyroid medication. Her medication was not changed last visit.   Lab Results  Component Value Date   TSH 0.77 01/09/2017  .   Medications Current Outpatient Prescriptions on File Prior to Visit  Medication Sig  . aspirin 81 MG tablet Take 81 mg by mouth daily.  Marland Kitchen BIOTIN PO Take 1 tablet by mouth daily.  . bisoprolol-hydrochlorothiazide (ZIAC) 2.5-6.25 MG tablet TAKE 1 TABLET BY MOUTH EVERY DAY  . budesonide (ENTOCORT EC) 3 MG 24 hr capsule Take 3 mg by mouth 3 (three) times daily as needed.   . Calcium Carb-Cholecalciferol (CALCIUM 600 + D PO) Take 1 tablet by mouth 2 (two) times daily.  . cetirizine (ZYRTEC) 10 MG tablet Take 10 mg by mouth daily.  . Cholecalciferol (VITAMIN D3) 2000 UNITS TABS Take 1 capsule by mouth daily.  Marland Kitchen CINNAMON PO Take by mouth.  . fish oil-omega-3 fatty acids 1000 MG capsule Take 1 g by mouth daily.   . Flaxseed, Linseed, (FLAX SEED OIL) 1000 MG CAPS Take 1,000 mg by mouth daily.  Marland Kitchen levothyroxine (SYNTHROID, LEVOTHROID) 125 MCG tablet TAKE 1 TABLET BY MOUTH DAILY BEFORE BREAKFAST  . Multiple Vitamins-Minerals (MULTIVITAMIN  WITH MINERALS) tablet Take 1 tablet by mouth daily.  Marland Kitchen omeprazole (PRILOSEC) 20 MG capsule Take 20 mg by mouth daily.  . Omeprazole Magnesium (PRILOSEC OTC PO) Take by mouth daily.  . Probiotic Product (PROBIOTIC DAILY PO) Take 1 tablet by mouth daily.   . simvastatin (ZOCOR) 20 MG tablet TAKE 1 TABLET BY MOUTH DAILY  . vitamin C (ASCORBIC ACID) 500 MG tablet Take 500 mg by mouth daily.   Current Facility-Administered Medications on File Prior to Visit  Medication  . dexamethasone (DECADRON) injection 10 mg    Problem list She has Essential hypertension; Hyperlipidemia; Hypothyroid; GERD; Asthma; DJD (degenerative joint disease); Vitamin D deficiency; Osteopenia; Allergy; Anemia; Prediabetes; Medication management; Colitis; and Encounter for Medicare annual wellness exam on her problem list.  Review of Systems  Constitutional: Negative for chills and fatigue.  HENT: Negative.   Respiratory: Negative.   Cardiovascular: Negative.   Gastrointestinal: Negative.   Genitourinary: Negative.   Musculoskeletal: Positive for back pain and neck pain. Negative for joint swelling and neck stiffness.  Skin: Negative.  Negative for rash.  Neurological: Positive for light-headedness, numbness and headaches. Negative for dizziness, facial asymmetry, speech difficulty and weakness.  Psychiatric/Behavioral: Negative.        Objective:   Physical Exam  Constitutional: She is oriented to person, place, and  time. She appears well-developed and well-nourished. No distress.  HENT:  Head: Normocephalic and atraumatic.  Mouth/Throat: Oropharynx is clear and moist. No oropharyngeal exudate.  Eyes: Conjunctivae and EOM are normal. Pupils are equal, round, and reactive to light. No scleral icterus.  Neck: Normal range of motion. Neck supple. No JVD present. No thyromegaly present.  Cardiovascular: Normal rate, regular rhythm, normal heart sounds and intact distal pulses.  Exam reveals no gallop and no  friction rub.   No murmur heard. Pulmonary/Chest: Effort normal and breath sounds normal. No respiratory distress. She has no wheezes. She has no rales. She exhibits no tenderness.  Abdominal: Soft. Bowel sounds are normal.  Musculoskeletal: Normal range of motion.  Patient rises slowly from sitting to standing.  They walk without an antalgic gait.  There is no evidence of erythema, ecchymosis, or gross deformity.  There is tenderness to palpation over right sternocleidomastoid.  There is no bony tenderness of the cervical, thoracic, or lumbar bony spine.  Active ROM is full in cervical thoracic and lumbar spine.  Sensation to light touch is intact over all extremities.  Strength is symmetric and equal in all extremities.    Lymphadenopathy:    She has no cervical adenopathy.  Neurological: She is alert and oriented to person, place, and time. She has normal strength. No cranial nerve deficit or sensory deficit. Coordination normal.  Skin: Skin is warm and dry. She is not diaphoretic.  Psychiatric: She has a normal mood and affect. Her behavior is normal. Judgment and thought content normal.  Nursing note and vitals reviewed.     Assessment & Plan:  Neck pain - Ambulatory referral to Physical Therapy  Paresthesias No focal deficits, will refer Pt, follow up ortho, try tyelnol Any changes in symptoms go to ER or call office - CBC with Differential/Platelet - BASIC METABOLIC PANEL WITH GFR - Hepatic function panel - TSH - Magnesium - Ambulatory referral to Physical Therapy   Loss of appetite Check labs, follow up GI - CBC with Differential/Platelet - BASIC METABOLIC PANEL WITH GFR - Hepatic function panel - TSH - Magnesium

## 2017-03-08 LAB — MAGNESIUM: MAGNESIUM: 1.9 mg/dL (ref 1.5–2.5)

## 2017-03-08 LAB — TSH: TSH: 1.03 mIU/L

## 2017-03-08 NOTE — Progress Notes (Signed)
Pt aware of lab results & voiced understanding of those results.

## 2017-03-14 DIAGNOSIS — M47812 Spondylosis without myelopathy or radiculopathy, cervical region: Secondary | ICD-10-CM | POA: Diagnosis not present

## 2017-04-03 DIAGNOSIS — Z85828 Personal history of other malignant neoplasm of skin: Secondary | ICD-10-CM | POA: Diagnosis not present

## 2017-04-03 DIAGNOSIS — L821 Other seborrheic keratosis: Secondary | ICD-10-CM | POA: Diagnosis not present

## 2017-04-03 DIAGNOSIS — D485 Neoplasm of uncertain behavior of skin: Secondary | ICD-10-CM | POA: Diagnosis not present

## 2017-04-05 DIAGNOSIS — H2513 Age-related nuclear cataract, bilateral: Secondary | ICD-10-CM | POA: Diagnosis not present

## 2017-04-05 DIAGNOSIS — H52221 Regular astigmatism, right eye: Secondary | ICD-10-CM | POA: Diagnosis not present

## 2017-04-05 DIAGNOSIS — H2512 Age-related nuclear cataract, left eye: Secondary | ICD-10-CM | POA: Diagnosis not present

## 2017-04-05 DIAGNOSIS — H2511 Age-related nuclear cataract, right eye: Secondary | ICD-10-CM | POA: Diagnosis not present

## 2017-04-05 DIAGNOSIS — H5201 Hypermetropia, right eye: Secondary | ICD-10-CM | POA: Diagnosis not present

## 2017-04-05 DIAGNOSIS — H25811 Combined forms of age-related cataract, right eye: Secondary | ICD-10-CM | POA: Diagnosis not present

## 2017-04-19 ENCOUNTER — Ambulatory Visit (INDEPENDENT_AMBULATORY_CARE_PROVIDER_SITE_OTHER): Payer: Medicare Other | Admitting: Internal Medicine

## 2017-04-19 VITALS — BP 116/68 | HR 84 | Temp 98.1°F | Resp 16 | Ht 65.5 in | Wt 105.4 lb

## 2017-04-19 DIAGNOSIS — H6502 Acute serous otitis media, left ear: Secondary | ICD-10-CM

## 2017-04-19 DIAGNOSIS — M26622 Arthralgia of left temporomandibular joint: Secondary | ICD-10-CM

## 2017-04-19 MED ORDER — PREDNISONE 20 MG PO TABS: ORAL_TABLET | ORAL | 0 refills | Status: DC

## 2017-04-21 ENCOUNTER — Encounter: Payer: Self-pay | Admitting: Internal Medicine

## 2017-04-21 NOTE — Progress Notes (Signed)
  Subjective:    Patient ID: Debbie Houston, female    DOB: [birth year], 76 y.o.   MRN: [id number]  HPI    Patient presents with intermittent sharp pains of the LT ear and also reports left ear feels "stopped" and hearing seems "muffled".   Review of Systems  10 point systems review negative except as above.    Objective:   Physical Exam  BP 116/68   Pulse 84   Temp 98.1 F (36.7 C)   Resp 16   Ht 5' 5.5" (1.664 m)   Wt 105 lb 6.4 oz (47.8 kg)   BMI 17.27 kg/m   HEENT - WNL except TM's sl retracted , LT>>RT and moderate tenderness of TM jts LT>Rt Neck - supple.  Chest - Clear equal BS. Cor - Nl HS. RRR w/o sig MGR. PP 1(+). No edema. MS- FROM w/o deformities.  Gait Nl. Neuro -  Nl w/o focal abnormalities.    Assessment & Plan:   1. Acute serous otitis media of left ear, recurrence not specified   2. Arthralgia of left temporomandibular joint  ----------------------------------------------------------------------  - predniSONE (DELTASONE) 20 MG tablet; 1 tab 3 x day for 2 days, then 1 tab 2 x day for 2 days, then 1 tab 1 x day for 3 days  Dispense: 13 tablet; Refill: 0

## 2017-04-21 NOTE — Patient Instructions (Signed)
Temporomandibular Joint Syndrome Temporomandibular joint (TMJ) syndrome is a condition that affects the joints between your jaw and your skull. The TMJs are located near your ears and allow your jaw to open and close. These joints and the nearby muscles are involved in all movements of the jaw. People with TMJ syndrome have pain in the area of these joints and muscles. Chewing, biting, or other movements of the jaw can be difficult or painful. TMJ syndrome can be caused by various things. In many cases, the condition is mild and goes away within a few weeks. For some people, the condition can become a long-term problem. What are the causes? Possible causes of TMJ syndrome include:  Grinding your teeth or clenching your jaw. Some people do this when they are under stress.  Arthritis.  Injury to the jaw.  Head or neck injury.  Teeth or dentures that are not aligned well.  In some cases, the cause of TMJ syndrome may not be known. What are the signs or symptoms? The most common symptom is an aching pain on the side of the head in the area of the TMJ. Other symptoms may include:  Pain when moving your jaw, such as when chewing or biting.  Being unable to open your jaw all the way.  Making a clicking sound when you open your mouth.  Headache.  Earache.  Neck or shoulder pain.  How is this diagnosed? Diagnosis can usually be made based on your symptoms, your medical history, and a physical exam. Your health care provider may check the range of motion of your jaw. Imaging tests, such as X-rays or an MRI, are sometimes done. You may need to see your dentist to determine if your teeth and jaw are lined up correctly. How is this treated? TMJ syndrome often goes away on its own. If treatment is needed, the options may include:  Eating soft foods and applying ice or heat.  Medicines to relieve pain or inflammation.  Medicines to relax the muscles.  A splint, bite plate, or mouthpiece  to prevent teeth grinding or jaw clenching.  Relaxation techniques or counseling to help reduce stress.  Transcutaneous electrical nerve stimulation (TENS). This helps to relieve pain by applying an electrical current through the skin.  Acupuncture. This is sometimes helpful to relieve pain.  Jaw surgery. This is rarely needed.  Follow these instructions at home:  Take medicines only as directed by your health care provider.  Eat a soft diet if you are having trouble chewing.  Apply ice to the painful area. ? Put ice in a plastic bag. ? Place a towel between your skin and the bag. ? Leave the ice on for 20 minutes, 2-3 times a day.  Apply a warm compress to the painful area as directed.  Massage your jaw area and perform any jaw stretching exercises as recommended by your health care provider.  If you were given a mouthpiece or bite plate, wear it as directed.  Avoid foods that require a lot of chewing. Do not chew gum.  Keep all follow-up visits as directed by your health care provider. This is important. Contact a health care provider if:  You are having trouble eating.  You have new or worsening symptoms. Get help right away if:  Your jaw locks open or closed. This information is not intended to replace advice given to you by your health care provider. Make sure you discuss any questions you have with your health care provider. Document  Released: 07/31/2001 Document Revised: 07/05/2016 Document Reviewed: 06/10/2014 Elsevier Interactive Patient Education  [employer].

## 2017-04-29 DIAGNOSIS — H2512 Age-related nuclear cataract, left eye: Secondary | ICD-10-CM | POA: Diagnosis not present

## 2017-04-30 DIAGNOSIS — H2512 Age-related nuclear cataract, left eye: Secondary | ICD-10-CM | POA: Diagnosis not present

## 2017-04-30 DIAGNOSIS — Z961 Presence of intraocular lens: Secondary | ICD-10-CM | POA: Diagnosis not present

## 2017-05-03 ENCOUNTER — Other Ambulatory Visit: Payer: Self-pay | Admitting: Internal Medicine

## 2017-05-03 ENCOUNTER — Ambulatory Visit (INDEPENDENT_AMBULATORY_CARE_PROVIDER_SITE_OTHER): Payer: Medicare Other | Admitting: Internal Medicine

## 2017-05-03 VITALS — BP 110/60 | HR 64 | Temp 97.9°F | Resp 16 | Ht 65.5 in | Wt 106.2 lb

## 2017-05-03 DIAGNOSIS — E782 Mixed hyperlipidemia: Secondary | ICD-10-CM

## 2017-05-03 DIAGNOSIS — I10 Essential (primary) hypertension: Secondary | ICD-10-CM | POA: Diagnosis not present

## 2017-05-03 DIAGNOSIS — E559 Vitamin D deficiency, unspecified: Secondary | ICD-10-CM | POA: Diagnosis not present

## 2017-05-03 DIAGNOSIS — K2100 Gastro-esophageal reflux disease with esophagitis, without bleeding: Secondary | ICD-10-CM

## 2017-05-03 DIAGNOSIS — Z79899 Other long term (current) drug therapy: Secondary | ICD-10-CM | POA: Diagnosis not present

## 2017-05-03 DIAGNOSIS — E039 Hypothyroidism, unspecified: Secondary | ICD-10-CM | POA: Diagnosis not present

## 2017-05-03 DIAGNOSIS — R7303 Prediabetes: Secondary | ICD-10-CM | POA: Diagnosis not present

## 2017-05-03 LAB — BASIC METABOLIC PANEL WITH GFR
BUN: 15 mg/dL (ref 7–25)
CALCIUM: 9.5 mg/dL (ref 8.6–10.4)
CO2: 24 mmol/L (ref 20–31)
CREATININE: 0.92 mg/dL (ref 0.60–0.93)
Chloride: 102 mmol/L (ref 98–110)
GFR, EST AFRICAN AMERICAN: 70 mL/min (ref >=60)
GFR, Est Non African American: 61 mL/min (ref >=60)
Glucose, Bld: 105 mg/dL — ABNORMAL HIGH (ref 65–99)
Potassium: 4.3 mmol/L (ref 3.5–5.3)
SODIUM: 137 mmol/L (ref 135–146)

## 2017-05-03 LAB — CBC WITH DIFFERENTIAL/PLATELET
Basophils Absolute: 0 cells/uL (ref 0–200)
Basophils Relative: 0 %
EOS PCT: 2 %
Eosinophils Absolute: 158 cells/uL (ref 15–500)
HCT: 42 % (ref 35.0–45.0)
HEMOGLOBIN: 13.9 g/dL (ref 11.7–15.5)
LYMPHS ABS: 1185 {cells}/uL (ref 850–3900)
LYMPHS PCT: 15 %
MCH: 30.8 pg (ref 27.0–33.0)
MCHC: 33.1 g/dL (ref 32.0–36.0)
MCV: 92.9 fL (ref 80.0–100.0)
MONOS PCT: 8 %
MPV: 11 fL (ref 7.5–12.5)
Monocytes Absolute: 632 cells/uL (ref 200–950)
NEUTROS PCT: 75 %
Neutro Abs: 5925 cells/uL (ref 1500–7800)
Platelets: 184 10*3/uL (ref 140–400)
RBC: 4.52 MIL/uL (ref 3.80–5.10)
RDW: 15.3 % — AB (ref 11.0–15.0)
WBC: 7.9 10*3/uL (ref 3.8–10.8)

## 2017-05-03 LAB — HEPATIC FUNCTION PANEL
ALT: 17 U/L (ref 6–29)
AST: 20 U/L (ref 10–35)
Albumin: 4.1 g/dL (ref 3.6–5.1)
Alkaline Phosphatase: 48 U/L (ref 33–130)
BILIRUBIN DIRECT: 0.1 mg/dL (ref <=0.2)
Indirect Bilirubin: 0.4 mg/dL (ref 0.2–1.2)
TOTAL PROTEIN: 5.9 g/dL — AB (ref 6.1–8.1)
Total Bilirubin: 0.5 mg/dL (ref 0.2–1.2)

## 2017-05-03 LAB — LIPID PANEL
Cholesterol: 195 mg/dL (ref <200)
HDL: 72 mg/dL (ref >50)
LDL CALC: 110 mg/dL — AB (ref <100)
Total CHOL/HDL Ratio: 2.7 Ratio (ref <5.0)
Triglycerides: 63 mg/dL (ref <150)
VLDL: 13 mg/dL (ref <30)

## 2017-05-03 LAB — TSH: TSH: 1.01 m[IU]/L

## 2017-05-03 MED ORDER — SUCRALFATE 1 G PO TABS: ORAL_TABLET | ORAL | 1 refills | Status: DC

## 2017-05-03 NOTE — Progress Notes (Addendum)
This very nice 76 y.o. MWF  presents for 3 month follow up with Hypertension, Hyperlipidemia, Pre-Diabetes and Vitamin D Deficiency. Patient also has GERD controlled with prudent meds.     Patient is treated for HTN (2015) & BP has been controlled at home. Today's BP is at goal - 110/60. Patient has had no complaints of any cardiac type chest pain, palpitations, dyspnea/orthopnea/PND, dizziness, claudication, or dependent edema.     Hyperlipidemia is controlled with diet & meds. Patient denies myalgias or other med SE's. Last Lipids were near goal: Lab Results  Component Value Date   CHOL 178 01/09/2017   HDL 57 01/09/2017   LDLCALC 105 (H) 01/09/2017   TRIG 81 01/09/2017   CHOLHDL 3.1 01/09/2017      Also, the patient has history of PreDiabetes A1c 5.8% in 2015)  and has had no symptoms of reactive hypoglycemia, diabetic polys, paresthesias or visual blurring.  Last A1c was at goal: Lab Results  Component Value Date   HGBA1C 5.5 10/08/2016     Patient has been on thyroid replacement circa 1990's.  Further, the patient also has history of Vitamin D Deficiency and supplements vitamin D without any suspected side-effects. Last vitamin D was   Lab Results  Component Value Date   VD25OH 73 01/09/2017   Current Outpatient Prescriptions on File Prior to Visit  Medication Sig  . aspirin 81 MG tablet Take 81 mg by mouth daily.  Marland Kitchen BIOTIN PO Take 1 tablet by mouth daily.  . bisoprolol-hydrochlorothiazide (ZIAC) 2.5-6.25 MG tablet TAKE 1 TABLET BY MOUTH EVERY DAY  . budesonide (ENTOCORT EC) 3 MG 24 hr capsule Take 3 mg by mouth 3 (three) times daily as needed.   . Calcium Carb-Cholecalciferol (CALCIUM 600 + D PO) Take 1 tablet by mouth 2 (two) times daily.  . cetirizine (ZYRTEC) 10 MG tablet Take 10 mg by mouth daily.  . Cholecalciferol (VITAMIN D3) 2000 UNITS TABS Take 1 capsule by mouth daily.  Marland Kitchen CINNAMON PO Take by mouth.  . fish oil-omega-3 fatty acids 1000 MG capsule Take 1 g by  mouth daily.   . Flaxseed, Linseed, (FLAX SEED OIL) 1000 MG CAPS Take 1,000 mg by mouth daily.  Marland Kitchen levothyroxine (SYNTHROID, LEVOTHROID) 125 MCG tablet TAKE 1 TABLET BY MOUTH DAILY BEFORE BREAKFAST  . Multiple Vitamins-Minerals (MULTIVITAMIN WITH MINERALS) tablet Take 1 tablet by mouth daily.  . Probiotic Product (PROBIOTIC DAILY PO) Take 1 tablet by mouth daily.   . simvastatin (ZOCOR) 20 MG tablet TAKE 1 TABLET BY MOUTH DAILY  . vitamin C (ASCORBIC ACID) 500 MG tablet Take 500 mg by mouth daily.   No current facility-administered medications on file prior to visit.    Allergies  Allergen Reactions  . Fosamax [Alendronate Sodium] Other (See Comments)    Gi uPSET  . Singulair [Montelukast Sodium] Other (See Comments)    JITTERS  . Wellbutrin [Bupropion] Hives  . Sulfa Antibiotics Rash   PMHx:   Past Medical History:  Diagnosis Date  . Arthritis   . Benign labile hypertension   . Colitis   . DJD (degenerative joint disease)   . Endometrial polyp   . GERD (gastroesophageal reflux disease)   . History of basal cell carcinoma excision   . History of esophagitis   . Hyperlipidemia   . Hypothyroidism   . Osteopenia   . Prediabetes   . Vitamin D deficiency   . Wears glasses    Immunization History  Administered Date(s)  Administered  . Influenza, High Dose Seasonal PF 07/23/2014, 08/23/2015, 07/10/2016  . Pneumococcal Conjugate-13 05/05/2015  . Pneumococcal-Unspecified 08/22/2011  . Tdap 08/22/2011  . Zoster 06/20/2006   Past Surgical History:  Procedure Laterality Date  . DILATATION & CURETTAGE/HYSTEROSCOPY WITH MYOSURE N/A 10/27/2014   Procedure: DILATATION & CURETTAGE/HYSTEROSCOPY WITH MYOSURE;  Surgeon: Darlyn Chamber, MD;  Location: [facility];  Service: Gynecology;  Laterality: N/A;  . DILATION AND CURETTAGE OF UTERUS    . MOHS SURGERY  2005   LEFT NASAL BRIDGE FOR BASAL CELL  . TONSILLECTOMY  age 61   FHx:    Reviewed / unchanged  SHx:    Reviewed /  unchanged  Systems Review:  Constitutional: Denies fever, chills, wt changes, headaches, insomnia, fatigue, night sweats, change in appetite. Eyes: Denies redness, blurred vision, diplopia, discharge, itchy, watery eyes.  ENT: Denies discharge, congestion, post nasal drip, epistaxis, sore throat, earache, hearing loss, dental pain, tinnitus, vertigo, sinus pain, snoring.  CV: Denies chest pain, palpitations, irregular heartbeat, syncope, dyspnea, diaphoresis, orthopnea, PND, claudication or edema. Respiratory: denies cough, dyspnea, DOE, pleurisy, hoarseness, laryngitis, wheezing.  Gastrointestinal: Denies dysphagia, odynophagia, heartburn, reflux, water brash, abdominal pain or cramps, nausea, vomiting, bloating, diarrhea, constipation, hematemesis, melena, hematochezia  or hemorrhoids. Genitourinary: Denies dysuria, frequency, urgency, nocturia, hesitancy, discharge, hematuria or flank pain. Musculoskeletal: Denies arthralgias, myalgias, stiffness, jt. swelling, pain, limping or strain/sprain.  Skin: Denies pruritus, rash, hives, warts, acne, eczema or change in skin lesion(s). Neuro: No weakness, tremor, incoordination, spasms, paresthesia or pain. Psychiatric: Denies confusion, memory loss or sensory loss. Endo: Denies change in weight, skin or hair change.  Heme/Lymph: No excessive bleeding, bruising or enlarged lymph nodes.  Physical Exam  BP 110/60   Pulse 64   Temp 97.9 F (36.6 C)   Resp 16   Ht 5' 5.5" (1.664 m)   Wt 106 lb 3.2 oz (48.2 kg)   BMI 17.40 kg/m   Appears well nourished, well groomed  and in no distress.  Eyes: PERRLA, EOMs, conjunctiva no swelling or erythema. Sinuses: No frontal/maxillary tenderness ENT/Mouth: EAC's clear, TM's nl w/o erythema, bulging. Nares clear w/o erythema, swelling, exudates. Oropharynx clear without erythema or exudates. Oral hygiene is good. Tongue normal, non obstructing. Hearing intact.  Neck: Supple. Thyroid nl. Car 2+/2+ without  bruits, nodes or JVD. Chest: Respirations nl with BS clear & equal w/o rales, rhonchi, wheezing or stridor.  Cor: Heart sounds normal w/ regular rate and rhythm without sig. murmurs, gallops, clicks or rubs. Peripheral pulses normal and equal  without edema.  Abdomen: Soft & bowel sounds normal. Non-tender w/o guarding, rebound, hernias, masses or organomegaly.  Lymphatics: Unremarkable.  Musculoskeletal: Full ROM all peripheral extremities, joint stability, 5/5 strength and normal gait.  Skin: Warm, dry without exposed rashes, lesions or ecchymosis apparent.  Neuro: Cranial nerves intact, reflexes equal bilaterally. Sensory-motor testing grossly intact. Tendon reflexes grossly intact.  Pysch: Alert & oriented x 3.  Insight and judgement nl & appropriate. No ideations.  Assessment and Plan:  1. Essential hypertension  - Continue medication, monitor blood pressure at home.  - Continue DASH diet. Reminder to go to the ER if any CP,  SOB, nausea, dizziness, severe HA, changes vision/speech,  left arm numbness and tingling and jaw pain.  - CBC with Differential/Platelet - BASIC METABOLIC PANEL WITH GFR - Magnesium - TSH  2. Hyperlipidemia, mixed  - Continue diet/meds, exercise,& lifestyle modifications.  - Continue monitor periodic cholesterol/liver & renal functions  - Hepatic  function panel - Lipid panel - TSH  3. Prediabetes  - Hemoglobin A1c - Insulin, random  4. Vitamin D deficiency  - Continue diet, exercise, lifestyle modifications.  - Monitor appropriate labs. - Continue supplementation. - VITAMIN D 25 Hydroxy  5. Hypothyroidism, unspecified type  - TSH  6. Gastroesophageal reflux disease  7. Medication management  - CBC with Differential/Platelet - BASIC METABOLIC PANEL WITH GFR - Hepatic function panel - Magnesium - Lipid panel - TSH - Hemoglobin A1c - Insulin, random - VITAMIN D 25 Hydroxyl      Discussed  regular exercise, BP monitoring, weight  control to achieve/maintain BMI less than 25 and discussed med and SE's. Recommended labs to assess and monitor clinical status with further disposition pending results of labs. Over 30 minutes of exam, counseling, chart review was performed.

## 2017-05-03 NOTE — Patient Instructions (Signed)

## 2017-05-04 ENCOUNTER — Encounter: Payer: Self-pay | Admitting: Internal Medicine

## 2017-05-04 LAB — HEMOGLOBIN A1C
HEMOGLOBIN A1C: 5.6 % (ref <5.7)
MEAN PLASMA GLUCOSE: 114 mg/dL

## 2017-05-04 LAB — VITAMIN D 25 HYDROXY (VIT D DEFICIENCY, FRACTURES): Vit D, 25-Hydroxy: 65 ng/mL (ref 30–100)

## 2017-05-04 LAB — MAGNESIUM: Magnesium: 1.7 mg/dL (ref 1.5–2.5)

## 2017-05-06 LAB — INSULIN, RANDOM: Insulin: 10.2 u[IU]/mL (ref 2.0–19.6)

## 2017-05-23 ENCOUNTER — Other Ambulatory Visit: Payer: Self-pay | Admitting: Internal Medicine

## 2017-05-27 ENCOUNTER — Emergency Department (HOSPITAL_COMMUNITY)
Admission: EM | Admit: 2017-05-27 | Discharge: 2017-05-27 | Disposition: A | Discharge status: Home / Self Care | Payer: Medicare Other | Attending: Emergency Medicine | Admitting: Emergency Medicine

## 2017-05-27 ENCOUNTER — Emergency Department (HOSPITAL_COMMUNITY): Payer: Medicare Other

## 2017-05-27 ENCOUNTER — Encounter (HOSPITAL_COMMUNITY): Payer: Self-pay | Admitting: Emergency Medicine

## 2017-05-27 ENCOUNTER — Other Ambulatory Visit: Payer: Self-pay

## 2017-05-27 DIAGNOSIS — Z7982 Long term (current) use of aspirin: Secondary | ICD-10-CM | POA: Diagnosis not present

## 2017-05-27 DIAGNOSIS — Z79899 Other long term (current) drug therapy: Secondary | ICD-10-CM | POA: Insufficient documentation

## 2017-05-27 DIAGNOSIS — F17210 Nicotine dependence, cigarettes, uncomplicated: Secondary | ICD-10-CM | POA: Diagnosis not present

## 2017-05-27 DIAGNOSIS — J45909 Unspecified asthma, uncomplicated: Secondary | ICD-10-CM | POA: Insufficient documentation

## 2017-05-27 DIAGNOSIS — R079 Chest pain, unspecified: Secondary | ICD-10-CM | POA: Diagnosis not present

## 2017-05-27 DIAGNOSIS — E039 Hypothyroidism, unspecified: Secondary | ICD-10-CM | POA: Diagnosis not present

## 2017-05-27 DIAGNOSIS — I10 Essential (primary) hypertension: Secondary | ICD-10-CM | POA: Insufficient documentation

## 2017-05-27 DIAGNOSIS — R0789 Other chest pain: Secondary | ICD-10-CM

## 2017-05-27 LAB — BASIC METABOLIC PANEL
Anion gap: 8 (ref 5–15)
BUN: 17 mg/dL (ref 6–20)
CHLORIDE: 104 mmol/L (ref 101–111)
CO2: 26 mmol/L (ref 22–32)
CREATININE: 0.71 mg/dL (ref 0.44–1.00)
Calcium: 9.7 mg/dL (ref 8.9–10.3)
GFR calc non Af Amer: >60 mL/min (ref >60)
Glucose, Bld: 114 mg/dL — ABNORMAL HIGH (ref 65–99)
POTASSIUM: 4 mmol/L (ref 3.5–5.1)
Sodium: 138 mmol/L (ref 135–145)

## 2017-05-27 LAB — CBC
HEMATOCRIT: 40 % (ref 36.0–46.0)
HEMOGLOBIN: 13.7 g/dL (ref 12.0–15.0)
MCH: 31 pg (ref 26.0–34.0)
MCHC: 34.3 g/dL (ref 30.0–36.0)
MCV: 90.5 fL (ref 78.0–100.0)
PLATELETS: 177 10*3/uL (ref 150–400)
RBC: 4.42 MIL/uL (ref 3.87–5.11)
RDW: 14.4 % (ref 11.5–15.5)
WBC: 7.5 10*3/uL (ref 4.0–10.5)

## 2017-05-27 LAB — POCT I-STAT TROPONIN I
TROPONIN I, POC: 0 ng/mL (ref 0.00–0.08)
Troponin i, poc: 0 ng/mL (ref 0.00–0.08)

## 2017-05-27 NOTE — ED Triage Notes (Signed)
Patient woke up tonight with chest pain. She states it felt like a lot of pressure. Patient also states that she has pain radiating down right arm. No n/v/d.

## 2017-05-27 NOTE — ED Notes (Signed)
Attempted to get blood 2x but was unsuccessful

## 2017-05-27 NOTE — ED Provider Notes (Signed)
[facility] DEPT Provider Note   CSN: [id number] Arrival date & time: 05/27/17  0045     History   Chief Complaint Chief Complaint  Patient presents with  . Chest Pain    HPI Debbie Houston is a 76 y.o. female.  The history is provided by the patient and medical records.    76 year old female with history of arthritis, hypertension, GERD, history of esophagitis, hyperlipidemia, hypothyroidism, vitamin D deficiency, presenting to the ED for chest pain.  Patient states last night for dinner she ate a chicken teriyaki sub that was a little spicy for her.  States she went onto bed and woke up around midnight with chest pain.  States it was burning all the way across her chest and felt like "it was on fire".  States she did noticed a little pressure as well.  No nausea, vomiting, diaphoresis, dizziness, weakness, confusion, etc.  States she did take some tums but didn't have much relief so she decided to get checked out.  No prior cardiac hx.  No family cardiac hx.  Former smoker.  Patient denies chest pain during my evaluation.  Past Medical History:  Diagnosis Date  . Arthritis   . Benign labile hypertension   . Colitis   . DJD (degenerative joint disease)   . Endometrial polyp   . GERD (gastroesophageal reflux disease)   . History of basal cell carcinoma excision   . History of esophagitis   . Hyperlipidemia   . Hypothyroidism   . Osteopenia   . Prediabetes   . Vitamin D deficiency   . Wears glasses     Patient Active Problem List   Diagnosis Date Noted  . Encounter for Medicare annual wellness exam 07/07/2015  . Colitis 12/21/2014  . Prediabetes 04/20/2014  . Medication management 04/20/2014  . Essential hypertension   . Hyperlipidemia   . Hypothyroid   . GERD   . Asthma   . DJD (degenerative joint disease)   . Vitamin D deficiency   . Osteopenia   . Allergy   . Anemia     Past Surgical History:  Procedure Laterality Date  . DILATATION &  CURETTAGE/HYSTEROSCOPY WITH MYOSURE N/A 10/27/2014   Procedure: DILATATION & CURETTAGE/HYSTEROSCOPY WITH MYOSURE;  Surgeon: Darlyn Chamber, MD;  Location: [facility];  Service: Gynecology;  Laterality: N/A;  . DILATION AND CURETTAGE OF UTERUS    . MOHS SURGERY  2005   LEFT NASAL BRIDGE FOR BASAL CELL  . TONSILLECTOMY  age 67    OB History    Gravida Para Term Preterm AB Living   3 2 2  0 1 2   SAB TAB Ectopic Multiple Live Births   1 0 0 0         Home Medications    Prior to Admission medications   Medication Sig Start Date End Date Taking? Authorizing Provider  acidophilus (RISAQUAD) CAPS capsule Take 1 capsule by mouth daily.   Yes [provider]  aspirin EC 81 MG tablet Take 81 mg by mouth at bedtime.   Yes [provider]  Biotin 1 MG CAPS Take 1 mg by mouth daily.   Yes [provider]  bisoprolol-hydrochlorothiazide (ZIAC) 2.5-6.25 MG tablet Take 1 tablet by mouth every other day.   Yes [provider]  budesonide (ENTOCORT EC) 3 MG 24 hr capsule Take 3 mg by mouth daily.   Yes [provider]  Calcium Carbonate-Vitamin D (CALCIUM 600+D HIGH POTENCY) 600-400 MG-UNIT tablet  Take 1 tablet by mouth daily.   Yes [provider]  cetirizine (ZYRTEC) 10 MG tablet Take 10 mg by mouth daily.    Yes [provider]  Cholecalciferol (VITAMIN D3) 2000 UNITS TABS Take 2,000 Units by mouth daily.    Yes [provider]  Cinnamon 500 MG TABS Take 500 mg by mouth daily.   Yes [provider]  Flaxseed, Linseed, (FLAX SEED OIL) 1000 MG CAPS Take 1,000 mg by mouth daily.    Yes [provider]  levothyroxine (SYNTHROID, LEVOTHROID) 125 MCG tablet Take 62.5-125 mcg by mouth daily before breakfast. Pt takes one tablet Monday, Wednesday, and Friday.   Pt takes one-half tablet Tuesday, Thursday, Saturday, and Sunday.   Yes [provider]  Multiple Vitamins-Minerals (MULTIVITAMIN WITH  MINERALS) tablet Take 1 tablet by mouth daily.   Yes [provider]  omega-3 acid ethyl esters (LOVAZA) 1 g capsule Take 1 g by mouth daily.   Yes [provider]  simvastatin (ZOCOR) 20 MG tablet TAKE 1 TABLET BY MOUTH DAILY Patient taking differently: Take 1 tablet by mouth at bedtime 05/23/17  Yes Unk Pinto, MD  vitamin C (ASCORBIC ACID) 500 MG tablet Take 500 mg by mouth daily.   Yes [provider]    Family History Family History  Problem Relation Age of Onset  . Hypertension Mother   . Cancer Mother        COLON  . Hyperlipidemia Mother   . Cancer Father        BREAST WITH BRAIN METS  . Hyperlipidemia Father   . Cancer Sister        COLON (TEENS), BREAST (20)  . Hyperlipidemia Sister   . Cancer Maternal Grandmother        RENAL  . Hyperlipidemia Maternal Grandmother   . Diabetes Brother   . Hyperlipidemia Brother   . Diabetes Maternal Grandfather   . Stroke Maternal Grandfather   . Hyperlipidemia Maternal Grandfather   . Hyperlipidemia Paternal Grandmother   . Hyperlipidemia Paternal Grandfather     Social History Social History  Substance Use Topics  . Smoking status: Light Tobacco Smoker    Years: 15.00    Types: Cigarettes  . Smokeless tobacco: Never Used     Comment: RARELY SMOKES  . Alcohol use Yes     Comment: OCCASIONAL     Allergies   Fosamax [alendronate sodium]; Singulair [montelukast sodium]; Wellbutrin [bupropion]; and Sulfa antibiotics   Review of Systems Review of Systems  Cardiovascular: Positive for chest pain.  All other systems reviewed and are negative.    Physical Exam Updated Vital Signs BP 130/77   Pulse 62   Temp 98 F (36.7 C) (Oral)   Resp 14   Ht 5' 5"  (1.651 m)   Wt 49 kg (108 lb)   SpO2 94%   BMI 17.97 kg/m   Physical Exam  Constitutional: She is oriented to person, place, and time. She appears well-developed and well-nourished.  HENT:  Head: Normocephalic and atraumatic.    Mouth/Throat: Oropharynx is clear and moist.  Eyes: Conjunctivae and EOM are normal. Pupils are equal, round, and reactive to light.  Neck: Normal range of motion.  Cardiovascular: Normal rate, regular rhythm and normal heart sounds.   Pulmonary/Chest: Effort normal and breath sounds normal. No respiratory distress. She has no wheezes.  Abdominal: Soft. Bowel sounds are normal. There is no tenderness. There is no rebound.  Musculoskeletal: Normal range of motion.  Neurological: She  is alert and oriented to person, place, and time.  Skin: Skin is warm and dry.  Psychiatric: She has a normal mood and affect.  Nursing note and vitals reviewed.    ED Treatments / Results  Labs (all labs ordered are listed, but only abnormal results are displayed) Labs Reviewed  BASIC METABOLIC PANEL - Abnormal; Notable for the following:       Result Value   Glucose, Bld 114 (*)    All other components within normal limits  CBC  I-STAT TROPOININ, ED  POCT I-STAT TROPONIN I  I-STAT TROPOININ, ED  POCT I-STAT TROPONIN I    EKG  EKG Interpretation  Date/Time:  Monday May 27 2017 01:01:42 EDT Ventricular Rate:  62 PR Interval:    QRS Duration: 136 QT Interval:  431 QTC Calculation: 438 R Axis:   94 Text Interpretation:  Sinus rhythm RBBB  Confirmed by Dory Horn) on 05/27/2017 1:06:29 AM       Radiology Dg Chest 2 View  Result Date: 05/27/2017 CLINICAL DATA:  Initial evaluation for acute chest pain. EXAM: CHEST  2 VIEW COMPARISON:  Prior radiograph from 12/29/2013. FINDINGS: The cardiac and mediastinal silhouettes are stable in size and contour, and remain within normal limits. The lungs are normally inflated. No airspace consolidation, pleural effusion, or pulmonary edema is identified. There is no pneumothorax. No acute osseous abnormality identified. IMPRESSION: No active cardiopulmonary disease. Electronically Signed   By: Jeannine Boga M.D.   On: 05/27/2017 01:17     Procedures Procedures (including critical care time)  Medications Ordered in ED Medications - No data to display   Initial Impression / Assessment and Plan / ED Course  I have reviewed the triage vital signs and the nursing notes.  Pertinent labs & imaging results that were available during my care of the patient were reviewed by me and considered in my medical decision making (see chart for details).  76 year old female here with chest pain.  Reports eating spicy chicken teriyaki sub for dinner.  States pain felt like burning, as if her "chest was on fire".  By time of my evaluation, reports symptoms resolved.  No other associated symptoms.  EKG without acute findings.  Labs reassuring.  CXR clear.  Patient remains symptom free here.  She does have some cardiac RF (age, HTN, HLP).  Will obtain delta troponin.  Delta troponin negative.  Remains asymptomatic here.  Given nature of symptoms, suspect may be GERD related.  Lower suspicion for ACS, PE, dissection, or other acute event at this time.  Feel she is stable for discharge.  Will have her follow-up closely with her primary care doctor.  Discussed plan with patient, she acknowledged understanding and agreed with plan of care.  Return precautions given for new or worsening symptoms.  Patient seen and evaluated with attending physician, Dr. Randal Buba, who agrees with care plan.  Final Clinical Impressions(s) / ED Diagnoses   Final diagnoses:  Other chest pain    New Prescriptions New Prescriptions   No medications on file     Larene Pickett, PA-C 05/27/17 0636    Palumbo, April, MD 05/27/17 [phone]

## 2017-05-27 NOTE — ED Notes (Signed)
Patient transported to X-ray 

## 2017-05-27 NOTE — Discharge Instructions (Signed)
Your cardiac work-up today was normal. We recommend that you follow-up with your primary care doctor. Return here for any new/worsening symptoms.

## 2017-06-12 ENCOUNTER — Other Ambulatory Visit: Payer: Self-pay | Admitting: Internal Medicine

## 2017-06-13 ENCOUNTER — Other Ambulatory Visit: Payer: Self-pay | Admitting: Physician Assistant

## 2017-06-13 ENCOUNTER — Ambulatory Visit (INDEPENDENT_AMBULATORY_CARE_PROVIDER_SITE_OTHER): Payer: Medicare Other | Admitting: Physician Assistant

## 2017-06-13 ENCOUNTER — Encounter: Payer: Self-pay | Admitting: Physician Assistant

## 2017-06-13 VITALS — BP 116/72 | HR 94 | Temp 97.7°F | Resp 14 | Ht 65.5 in | Wt 105.8 lb

## 2017-06-13 DIAGNOSIS — L438 Other lichen planus: Secondary | ICD-10-CM | POA: Diagnosis not present

## 2017-06-13 DIAGNOSIS — K2100 Gastro-esophageal reflux disease with esophagitis, without bleeding: Secondary | ICD-10-CM

## 2017-06-13 DIAGNOSIS — R1011 Right upper quadrant pain: Secondary | ICD-10-CM

## 2017-06-13 DIAGNOSIS — R0609 Other forms of dyspnea: Secondary | ICD-10-CM

## 2017-06-13 DIAGNOSIS — D649 Anemia, unspecified: Secondary | ICD-10-CM

## 2017-06-13 DIAGNOSIS — D1801 Hemangioma of skin and subcutaneous tissue: Secondary | ICD-10-CM | POA: Diagnosis not present

## 2017-06-13 DIAGNOSIS — L821 Other seborrheic keratosis: Secondary | ICD-10-CM | POA: Diagnosis not present

## 2017-06-13 DIAGNOSIS — D692 Other nonthrombocytopenic purpura: Secondary | ICD-10-CM | POA: Diagnosis not present

## 2017-06-13 DIAGNOSIS — Z85828 Personal history of other malignant neoplasm of skin: Secondary | ICD-10-CM | POA: Diagnosis not present

## 2017-06-13 DIAGNOSIS — L814 Other melanin hyperpigmentation: Secondary | ICD-10-CM | POA: Diagnosis not present

## 2017-06-13 LAB — CBC WITH DIFFERENTIAL/PLATELET
BASOS ABS: 71 {cells}/uL (ref 0–200)
Basophils Relative: 1 %
EOS ABS: 142 {cells}/uL (ref 15–500)
Eosinophils Relative: 2 %
HEMATOCRIT: 40.9 % (ref 35.0–45.0)
Hemoglobin: 13.6 g/dL (ref 11.7–15.5)
LYMPHS PCT: 25 %
Lymphs Abs: 1775 cells/uL (ref 850–3900)
MCH: 31.6 pg (ref 27.0–33.0)
MCHC: 33.3 g/dL (ref 32.0–36.0)
MCV: 95.1 fL (ref 80.0–100.0)
MONO ABS: 497 {cells}/uL (ref 200–950)
MONOS PCT: 7 %
MPV: 10.6 fL (ref 7.5–12.5)
NEUTROS PCT: 65 %
Neutro Abs: 4615 cells/uL (ref 1500–7800)
PLATELETS: 186 10*3/uL (ref 140–400)
RBC: 4.3 MIL/uL (ref 3.80–5.10)
RDW: 14.8 % (ref 11.0–15.0)
WBC: 7.1 10*3/uL (ref 3.8–10.8)

## 2017-06-13 LAB — BASIC METABOLIC PANEL WITH GFR
BUN: 12 mg/dL (ref 7–25)
CHLORIDE: 103 mmol/L (ref 98–110)
CO2: 24 mmol/L (ref 20–31)
CREATININE: 0.87 mg/dL (ref 0.60–0.93)
Calcium: 9.3 mg/dL (ref 8.6–10.4)
GFR, Est African American: 75 mL/min (ref >=60)
GFR, Est Non African American: 65 mL/min (ref >=60)
GLUCOSE: 86 mg/dL (ref 65–99)
POTASSIUM: 4.2 mmol/L (ref 3.5–5.3)
Sodium: 138 mmol/L (ref 135–146)

## 2017-06-13 LAB — HEPATIC FUNCTION PANEL
ALT: 15 U/L (ref 6–29)
AST: 19 U/L (ref 10–35)
Albumin: 4.1 g/dL (ref 3.6–5.1)
Alkaline Phosphatase: 51 U/L (ref 33–130)
BILIRUBIN DIRECT: 0.1 mg/dL (ref <=0.2)
Indirect Bilirubin: 0.2 mg/dL (ref 0.2–1.2)
TOTAL PROTEIN: 5.7 g/dL — AB (ref 6.1–8.1)
Total Bilirubin: 0.3 mg/dL (ref 0.2–1.2)

## 2017-06-13 LAB — IRON AND TIBC
%SAT: 23 % (ref 11–50)
IRON: 74 ug/dL (ref 45–160)
TIBC: 322 ug/dL (ref 250–450)
UIBC: 248 ug/dL

## 2017-06-13 MED ORDER — PANTOPRAZOLE SODIUM 40 MG PO TBEC: 40.0000 mg | DELAYED_RELEASE_TABLET | Freq: Every day | ORAL | 3 refills | Status: DC

## 2017-06-13 MED ORDER — FAMOTIDINE 40 MG PO TABS: 40.0000 mg | ORAL_TABLET | Freq: Every evening | ORAL | 1 refills | Status: DC

## 2017-06-13 NOTE — Progress Notes (Signed)
Subjective:    Patient ID: Debbie Houston, female    DOB: 1941/09/19, 76 y.o.   MRN: [id number]  HPI 76 y.o. WF with history of HTN, GERD, asthma, osteopenia, chol presents with SOB/shoulder pain.   She complains of fatigue, states she does minimal house work/yard work and then has to sit down/rest. She has been having left shoulder/flank pain into her left breast, worse with lying down. She also feels that her breathing is not like what it should be, will get flushing/sweat with lying down. Able to do her aerobic class 1 month ago without SOB, CP, dizziness. ETC.   She was in the ER 07/09, had burning chest pain, no accompaniments. No prior cardiac hx, no family hx, former smoker.   She is just on carafate for GERD, no black stool/blood in stool.   Blood pressure 116/72, pulse 94, temperature 97.7 F (36.5 C), resp. rate 14, height 5' 5.5" (1.664 m), weight 105 lb 12.8 oz (48 kg), SpO2 97 %.  Medications Current Outpatient Prescriptions on File Prior to Visit  Medication Sig  . acidophilus (RISAQUAD) CAPS capsule Take 1 capsule by mouth daily.  Marland Kitchen aspirin EC 81 MG tablet Take 81 mg by mouth at bedtime.  . Biotin 1 MG CAPS Take 1 mg by mouth daily.  . bisoprolol-hydrochlorothiazide (ZIAC) 2.5-6.25 MG tablet Take 1 tablet by mouth every other day.  . budesonide (ENTOCORT EC) 3 MG 24 hr capsule Take 3 mg by mouth daily.  . Calcium Carbonate-Vitamin D (CALCIUM 600+D HIGH POTENCY) 600-400 MG-UNIT tablet Take 1 tablet by mouth daily.  . cetirizine (ZYRTEC) 10 MG tablet Take 10 mg by mouth daily.   . Cholecalciferol (VITAMIN D3) 2000 UNITS TABS Take 2,000 Units by mouth daily.   . Cinnamon 500 MG TABS Take 500 mg by mouth daily.  . Flaxseed, Linseed, (FLAX SEED OIL) 1000 MG CAPS Take 1,000 mg by mouth daily.   Marland Kitchen levothyroxine (SYNTHROID, LEVOTHROID) 125 MCG tablet Take 62.5-125 mcg by mouth daily before breakfast. Pt takes one tablet Monday, Wednesday, and Friday.   Pt takes one-half tablet  Tuesday, Thursday, Saturday, and Sunday.  . Multiple Vitamins-Minerals (MULTIVITAMIN WITH MINERALS) tablet Take 1 tablet by mouth daily.  Marland Kitchen omega-3 acid ethyl esters (LOVAZA) 1 g capsule Take 1 g by mouth daily.  . simvastatin (ZOCOR) 20 MG tablet TAKE 1 TABLET BY MOUTH DAILY (Patient taking differently: Take 1 tablet by mouth at bedtime)  . vitamin C (ASCORBIC ACID) 500 MG tablet Take 500 mg by mouth daily.   No current facility-administered medications on file prior to visit.     Problem list She has Essential hypertension; Hyperlipidemia; Hypothyroid; GERD; Asthma; DJD (degenerative joint disease); Vitamin D deficiency; Osteopenia; Allergy; Anemia; Prediabetes; Medication management; Colitis; and Encounter for Medicare annual wellness exam on her problem list. d  Review of Systems  Constitutional: Positive for fatigue. Negative for activity change, appetite change, chills, diaphoresis, fever and unexpected weight change.  HENT: Negative.   Respiratory: Positive for shortness of breath. Negative for apnea, cough, choking, chest tightness, wheezing and stridor.   Cardiovascular: Negative for chest pain, palpitations and leg swelling.  Gastrointestinal: Positive for abdominal pain. Negative for abdominal distention, anal bleeding, blood in stool, constipation, diarrhea, nausea, rectal pain and vomiting.       + GERD  Genitourinary: Negative.   Musculoskeletal: Negative.   Skin: Negative.  Negative for rash.  Neurological: Negative.  Negative for dizziness.  Psychiatric/Behavioral: Negative.  Objective:   Physical Exam  Constitutional: She is oriented to person, place, and time. She appears well-developed and well-nourished.  HENT:  Head: Normocephalic and atraumatic.  Right Ear: External ear normal.  Left Ear: External ear normal.  Mouth/Throat: Oropharynx is clear and moist.  Eyes: Pupils are equal, round, and reactive to light. Conjunctivae and EOM are normal.  Neck:  Normal range of motion. Neck supple. No thyromegaly present.  Cardiovascular: Normal rate, regular rhythm and normal heart sounds.  Exam reveals no gallop and no friction rub.   No murmur heard. Pulmonary/Chest: Effort normal and breath sounds normal. No respiratory distress. She has no wheezes.  Abdominal: Soft. Bowel sounds are normal. She exhibits distension. She exhibits no shifting dullness, no abdominal bruit, no pulsatile midline mass and no mass. There is no hepatosplenomegaly. There is tenderness in the right upper quadrant and epigastric area. There is no rigidity, no rebound, no guarding, no CVA tenderness, no tenderness at McBurney's point and negative Murphy's sign. No hernia.  Musculoskeletal: Normal range of motion.  Lymphadenopathy:    She has no cervical adenopathy.  Neurological: She is alert and oriented to person, place, and time.  Skin: Skin is warm and dry.  Psychiatric: She has a normal mood and affect.      Assessment & Plan:   RUQ pain with GERD Rule out gallbladder, check labs, stop carafate and add on protonix/pepcid -     CBC with Differential/Platelet -     BASIC METABOLIC PANEL WITH GFR -     Hepatic function panel -     US Abdomen Complete; Future -     famotidine (PEPCID) 40 MG tablet; Take 1 tablet (40 mg total) by mouth every evening. -     pantoprazole (PROTONIX) 40 MG tablet; Take 1 tablet (40 mg total) by mouth daily.  Dyspnea on exertion Some tachycardia too, low risk, will get ddimer, ? From GERD -     EKG 12-Lead- IRBBB no changes -     D-dimer, quantitative (not at [facility])  Anemia, unspecified type -     CBC with Differential/Platelet -     BASIC METABOLIC PANEL WITH GFR -     Hepatic function panel -     Iron and TIBC -     Ferritin

## 2017-06-13 NOTE — Patient Instructions (Signed)
Take the protonix 1 day and pepcid the other day Can take imodium with them if needed  Stop the carafate  Will get AB [place]   Go to Er is symptoms worsen   Cholecystitis Cholecystitis is inflammation of the gallbladder. It is often called a gallbladder attack. The gallbladder is a pear-shaped organ that lies beneath the liver on the right side of the body. The gallbladder stores bile, which is a fluid that helps the body to digest fats. If bile builds up in your gallbladder, your gallbladder becomes inflamed. This condition may occur suddenly (be acute). Repeat episodes of acute cholecystitis or prolonged episodes may lead to a long-term (chronic) condition. Cholecystitis is serious and it requires treatment. What are the causes? The most common cause of this condition is gallstones. Gallstones can block the tube (duct) that carries bile out of your gallbladder. This causes bile to build up. Other causes of this condition include:  Damage to the gallbladder due to a decrease in blood flow.  Infections in the bile ducts.  Scars or kinks in the bile ducts.  Tumors in the liver, pancreas, or gallbladder.  What increases the risk? This condition is more likely to develop in:  People who have sickle cell disease.  People who take birth control pills or use estrogen.  People who have alcoholic liver disease.  People who have liver cirrhosis.  People who have their nutrition delivered through a vein (parenteral nutrition).  People who do not eat or drink (do fasting) for a long period of time.  People who are obese.  People who have rapid weight loss.  People who are pregnant.  People who have increased triglyceride levels.  People who have pancreatitis.  What are the signs or symptoms? Symptoms of this condition include:  Abdominal pain, especially in the upper right area of the abdomen.  Abdominal tenderness or  bloating.  Nausea.  Vomiting.  Fever.  Chills.  Yellowing of the skin and the whites of the eyes (jaundice).  How is this diagnosed? This condition is diagnosed with a medical history and physical exam. You may also have other tests, including:  Imaging tests, such as: ? An ultrasound of the gallbladder. ? A CT scan of the abdomen. ? A gallbladder nuclear scan (HIDA scan). This scan allows your health care provider to see the bile moving from your liver to your gallbladder and to your small intestine. ? MRI.  Blood tests, such as: ? A complete blood count, because the white blood cell count may be higher than normal. ? Liver function tests, because some levels may be higher than normal with certain types of gallstones.  How is this treated? Treatment may include:  Fasting for a certain amount of time.  IV fluids.  Medicine to treat pain or vomiting.  Antibiotic medicine.  Surgery to remove your gallbladder (cholecystectomy). This may happen immediately or at a later time.  Follow these instructions at home: Home care will depend on your treatment. In general:  Take over-the-counter and prescription medicines only as told by your health care provider.  If you were prescribed an antibiotic medicine, take it as told by your health care provider. Do not stop taking the antibiotic even if you start to feel better.  Follow instructions from your health care provider about what to eat or drink. When you are allowed to eat, avoid eating or drinking anything that triggers your symptoms.  Keep all follow-up visits as told by your health care  provider. This is important.  Contact a health care provider if:  Your pain is not controlled with medicine.  You have a fever. Get help right away if:  Your pain moves to another part of your abdomen or to your back.  You continue to have symptoms or you develop new symptoms even with treatment. This information is not intended  to replace advice given to you by your health care provider. Make sure you discuss any questions you have with your health care provider. Document Released: 11/05/2005 Document Revised: 03/15/2016 Document Reviewed: 02/16/2015 Elsevier Interactive Patient Education  2017 [employer].

## 2017-06-14 ENCOUNTER — Other Ambulatory Visit: Payer: Self-pay | Admitting: Internal Medicine

## 2017-06-14 LAB — FERRITIN: FERRITIN: 37 ng/mL (ref 20–288)

## 2017-06-14 LAB — D-DIMER, QUANTITATIVE (NOT AT ARMC): D DIMER QUANT: 0.32 ug{FEU}/mL (ref <0.50)

## 2017-06-17 ENCOUNTER — Ambulatory Visit
Admission: RE | Admit: 2017-06-17 | Discharge: 2017-06-17 | Disposition: A | Discharge status: Home / Self Care | Payer: Medicare Other | Source: Ambulatory Visit | Attending: Physician Assistant | Admitting: Physician Assistant

## 2017-06-17 DIAGNOSIS — K7689 Other specified diseases of liver: Secondary | ICD-10-CM | POA: Diagnosis not present

## 2017-06-17 DIAGNOSIS — K2100 Gastro-esophageal reflux disease with esophagitis, without bleeding: Secondary | ICD-10-CM

## 2017-06-17 DIAGNOSIS — R1011 Right upper quadrant pain: Secondary | ICD-10-CM

## 2017-07-01 DIAGNOSIS — M79651 Pain in right thigh: Secondary | ICD-10-CM | POA: Diagnosis not present

## 2017-08-06 ENCOUNTER — Ambulatory Visit (INDEPENDENT_AMBULATORY_CARE_PROVIDER_SITE_OTHER): Payer: Medicare Other | Admitting: Adult Health

## 2017-08-06 ENCOUNTER — Encounter: Payer: Self-pay | Admitting: Adult Health

## 2017-08-06 VITALS — BP 102/70 | HR 76 | Temp 97.3°F | Resp 16 | Ht 65.5 in | Wt 106.2 lb

## 2017-08-06 DIAGNOSIS — G44209 Tension-type headache, unspecified, not intractable: Secondary | ICD-10-CM

## 2017-08-06 MED ORDER — CYCLOBENZAPRINE HCL 5 MG PO TABS: 5.0000 mg | ORAL_TABLET | Freq: Three times a day (TID) | ORAL | 0 refills | Status: DC | PRN

## 2017-08-06 NOTE — Progress Notes (Signed)
Assessment and Plan:  Garland was seen today for acute visit.  Diagnoses and all orders for this visit:  Tension headache -     cyclobenzaprine (FLEXERIL) 5 MG tablet; Take 1 tablet (5 mg total) by mouth 3 (three) times daily as needed for muscle spasms.   Discussed flexeril/SE- patient expresses understanding.  Continue applying heat- 20 min 3x/ day as needed. Encouraged to hydrate, OTC analgesics as needed, though discussed limited efficacy typical for tension headaches.  Keep your optometrist appointment, consider blue light glasses for computer use, maintain good/neutral posture. Discuss TMJ with dentist.  Continue using cervical support at night.   Advised to go to the ER should she experience a sudden severe HA, dizziness, loss of balance, difficulty speaking or experiences weakness.     Discussed med's effects and SE's.   Over 30 minutes of exam, counseling, chart review, and critical decision making was performed.  Follow up as needed or as scheduled.   Future Appointments Date Time Provider [place]  08/27/2017 10:30 AM Vicie Mutters, PA-C [facility] None  01/09/2018 2:00 PM Vicie Mutters, PA-C [facility] None    ------------------------------------------------------------------------------------------------------------------   HPI BP 102/70   Pulse 76   Temp (!) 97.3 F (36.3 C)   Resp 16   Ht 5' 5.5" (1.664 m)   Wt 106 lb 3.2 oz (48.2 kg)   SpO2 97%   BMI 17.40 kg/m   76 y.o.female presents for intermittent neck pain/headache for the past several months, worse in the past 3 weeks. She describes the pain as a "pressure," starts at the base of her skill on the right, then gradually over her head and settles as pressure across her brow. The headaches are commonly triggered by computer use; reports when the headaches progress she does experience some blurry vision/photosensitivity. Denies halos, double vision, dizziness, rashes, nuchal rigidity, olfactory  changes.   She had cataracts removed 2 months ago, and has an appointment with her optometrist regarding her glasses soon. Encouraged her to keep this appointment.  Has had discussions regarding TMJ in the past- encouraged her to discuss this at her upcoming dental appointment in October.   She reports she was sent to PT several months ago for neck pain and was released, but has continued the exercises intermittently for the headaches, and that she does note some improvement.  She has also applied heat to her neck and reports that helped. She has not attempted OTC analgesics.   Brain MRI in 2016- negative.   Past Medical History:  Diagnosis Date  . Arthritis   . Benign labile hypertension   . Colitis   . DJD (degenerative joint disease)   . Endometrial polyp   . GERD (gastroesophageal reflux disease)   . History of basal cell carcinoma excision   . History of esophagitis   . Hyperlipidemia   . Hypothyroidism   . Osteopenia   . Prediabetes   . Vitamin D deficiency   . Wears glasses      Allergies  Allergen Reactions  . Fosamax [Alendronate Sodium] Other (See Comments)    Reaction:  GI upset   . Singulair [Montelukast Sodium] Other (See Comments)    Reaction:  Makes pt jittery  . Wellbutrin [Bupropion] Hives  . Sulfa Antibiotics Rash    Current Outpatient Prescriptions on File Prior to Visit  Medication Sig  . acidophilus (RISAQUAD) CAPS capsule Take 1 capsule by mouth daily.  Marland Kitchen aspirin EC 81 MG tablet Take 81 mg by mouth at bedtime.  Marland Kitchen  Biotin 1 MG CAPS Take 1 mg by mouth daily.  . bisoprolol-hydrochlorothiazide (ZIAC) 2.5-6.25 MG tablet Take 1 tablet by mouth every other day.  . budesonide (ENTOCORT EC) 3 MG 24 hr capsule Take 3 mg by mouth daily.  . Calcium Carbonate-Vitamin D (CALCIUM 600+D HIGH POTENCY) 600-400 MG-UNIT tablet Take 1 tablet by mouth daily.  . cetirizine (ZYRTEC) 10 MG tablet Take 10 mg by mouth daily.   . Cholecalciferol (VITAMIN D3) 2000 UNITS TABS  Take 2,000 Units by mouth daily.   . Cinnamon 500 MG TABS Take 500 mg by mouth daily.  . famotidine (PEPCID) 40 MG tablet TAKE 1 TABLET(40 MG) BY MOUTH EVERY EVENING  . Flaxseed, Linseed, (FLAX SEED OIL) 1000 MG CAPS Take 1,000 mg by mouth daily.   Marland Kitchen levothyroxine (SYNTHROID, LEVOTHROID) 125 MCG tablet Take 62.5-125 mcg by mouth daily before breakfast. Pt takes one tablet Monday, Wednesday, and Friday.   Pt takes one-half tablet Tuesday, Thursday, Saturday, and Sunday.  . levothyroxine (SYNTHROID, LEVOTHROID) 125 MCG tablet TAKE 1 TABLET BY MOUTH DAILY BEFORE BREAKFAST  . Multiple Vitamins-Minerals (MULTIVITAMIN WITH MINERALS) tablet Take 1 tablet by mouth daily.  Marland Kitchen omega-3 acid ethyl esters (LOVAZA) 1 g capsule Take 1 g by mouth daily.  . pantoprazole (PROTONIX) 40 MG tablet Take 1 tablet (40 mg total) by mouth daily.  . simvastatin (ZOCOR) 20 MG tablet TAKE 1 TABLET BY MOUTH DAILY (Patient taking differently: Take 1 tablet by mouth at bedtime)  . vitamin C (ASCORBIC ACID) 500 MG tablet Take 500 mg by mouth daily.   No current facility-administered medications on file prior to visit.     ROS:  Review of Systems  Constitutional: Negative for chills, fever, malaise/fatigue and weight loss.  HENT: Negative for congestion, ear pain, hearing loss, sinus pain, sore throat and tinnitus.   Eyes: Positive for blurred vision. Negative for double vision and pain.  Respiratory: Negative for cough, sputum production, shortness of breath and wheezing.   Cardiovascular: Negative for chest pain, palpitations and leg swelling.  Gastrointestinal: Negative for abdominal pain, diarrhea, nausea and vomiting.  Genitourinary: Negative.   Musculoskeletal: Positive for neck pain (Right sided). Negative for back pain, falls, joint pain and myalgias.  Skin: Negative for rash.  Neurological: Positive for headaches. Negative for dizziness, tremors, sensory change, speech change, focal weakness, loss of consciousness  and weakness.  Psychiatric/Behavioral: Negative.   All other systems reviewed and are negative.    Physical Exam:  BP 102/70   Pulse 76   Temp (!) 97.3 F (36.3 C)   Resp 16   Ht 5' 5.5" (1.664 m)   Wt 106 lb 3.2 oz (48.2 kg)   SpO2 97%   BMI 17.40 kg/m   General Appearance: Well nourished, in no apparent distress. Eyes: PERRLA, EOMs, conjunctiva no swelling or erythema Sinuses: No Frontal/maxillary tenderness ENT/Mouth: Ext aud canals clear, TMs without erythema, bulging. No erythema, swelling, or exudate on post pharynx.  Tonsils not swollen or erythematous. Hearing normal.  Neck: Supple, thyroid normal.  Respiratory: Respiratory effort normal, BS equal bilaterally without rales, rhonchi, wheezing or stridor.  Cardio: RRR with no MRGs. Brisk peripheral pulses without edema.  Abdomen: Soft, + BS.  Non tender, no guarding, rebound, hernias, masses. Lymphatics: Non tender without lymphadenopathy.  Musculoskeletal: Full ROM, 5/5 strength, normal gait. Tenderness at base of skull/over right sternocleidomastoid and right trapezius. No point tenderness over spine.  Skin: Warm, dry without rashes, lesions, ecchymosis.  Neuro: Cranial nerves intact. Normal muscle  tone, no cerebellar symptoms. Sensation intact.  Psych: Awake and oriented X 3, normal affect, Insight and Judgment appropriate.     Izora Ribas, NP 5:21 PM [facility]

## 2017-08-06 NOTE — Patient Instructions (Addendum)
Take flexeril PRN, continue to apply heat, hydrate well, may take OTC analgesics (tylenol/aleve, etc). Keep your optometrist appointment, consider blue light glasses for computer. Consider cervical pillow at night to reduce strain.   Monitor progress over the next few weeks, consider medical massage if not improving.    Tension Headache A tension headache is a feeling of pain, pressure, or aching that is often felt over the front and sides of the head. The pain can be dull, or it can feel tight (constricting). Tension headaches are not normally associated with nausea or vomiting, and they do not get worse with physical activity. Tension headaches can last from 30 minutes to several days. This is the most common type of headache. CAUSES The exact cause of this condition is not known. Tension headaches often begin after stress, anxiety, or depression. Other triggers may include:  Alcohol.  Too much caffeine, or caffeine withdrawal.  Respiratory infections, such as colds, flu, or sinus infections.  Dental problems or teeth clenching.  Fatigue.  Holding your head and neck in the same position for a long period of time, such as while using a computer.  Smoking. SYMPTOMS Symptoms of this condition include:  A feeling of pressure around the head.  Dull, aching head pain.  Pain felt over the front and sides of the head.  Tenderness in the muscles of the head, neck, and shoulders. DIAGNOSIS This condition may be diagnosed based on your symptoms and a physical exam. Tests may be done, such as a CT scan or an MRI of your head. These tests may be done if your symptoms are severe or unusual. TREATMENT This condition may be treated with lifestyle changes and medicines to help relieve symptoms. HOME CARE INSTRUCTIONS Managing Pain  Take over-the-counter and prescription medicines only as told by your health care provider.  Lie down in a dark, quiet room when you have a headache.  If  directed, apply ice to the head and neck area: ? Put ice in a plastic bag. ? Place a towel between your skin and the bag. ? Leave the ice on for 20 minutes, 2-3 times per day.  Use a heating pad or a hot shower to apply heat to the head and neck area as told by your health care provider. Eating and Drinking  Eat meals on a regular schedule.  Limit alcohol use.  Decrease your caffeine intake, or stop using caffeine. General Instructions  Keep all follow-up visits as told by your health care provider. This is important.  Keep a headache journal to help find out what may trigger your headaches. For example, write down: ? What you eat and drink. ? How much sleep you get. ? Any change to your diet or medicines.  Try massage or other relaxation techniques.  Limit stress.  Sit up straight, and avoid tensing your muscles.  Do not use tobacco products, including cigarettes, chewing tobacco, or e-cigarettes. If you need help quitting, ask your health care provider.  Exercise regularly as told by your health care provider.  Get 7-9 hours of sleep, or the amount recommended by your health care provider. SEEK MEDICAL CARE IF:  Your symptoms are not helped by medicine.  You have a headache that is different from what you normally experience.  You have nausea or you vomit.  You have a fever. SEEK IMMEDIATE MEDICAL CARE IF:  Your headache becomes severe.  You have repeated vomiting.  You have a stiff neck.  You have a loss  of vision.  You have problems with speech.  You have pain in your eye or ear.  You have muscular weakness or loss of muscle control.  You lose your balance or you have trouble walking.  You feel faint or you pass out.  You have confusion. This information is not intended to replace advice given to you by your health care provider. Make sure you discuss any questions you have with your health care provider. Document Released: 11/05/2005 Document  Revised: 07/27/2015 Document Reviewed: 02/28/2015 Elsevier Interactive Patient Education  2017 [employer].

## 2017-08-25 NOTE — Progress Notes (Signed)
Follow up  Assessment and Plan:  Essential hypertension - continue medications, DASH diet, exercise and monitor at home. Call if greater than 130/80.  -     CBC with Differential/Platelet -     BASIC METABOLIC PANEL WITH GFR -     Hepatic function panel -     TSH  Hypothyroidism, unspecified type Hypothyroidism-check TSH level, continue medications the same, reminded to take on an empty stomach 30-41mns before food.  -     TSH  Mixed hyperlipidemia -continue medications, check lipids, decrease fatty foods, increase activity. -     Lipid panel  Medication management -     Magnesium  Needs flu shot -     Flu vaccine HIGH DOSE PF  Tension headache Normal MRI 2016, no red flags ? From neck OA, monitor posture, try amitriptyline, if this does not help will do topamax.   Discussed med's effects and SE's. Screening labs and tests as requested with regular follow-up as recommended Future Appointments Date Time Provider D[place] 08/27/2017 10:30 AM CVicie Mutters PA-C [facility] None  01/09/2018 2:00 PM CVicie Mutters PA-C [facility] None     HPI  76y.o. female  Presents for follow up for HTN, chol, preDM.   Her blood pressure has been controlled at home, she is on 1/2 of BP med, today their BP is BP: 130/80 She does workout. She denies chest pain, shortness of breath, dizziness.  She has intermittent headache, back of neck, radiates around, worse at the computer and using right arm. Has neck pain, worse with using her right arm. Flexeril does not help. Will try amitriptyline first and then if that does not help topamax.  She is on cholesterol medication, zocor 2484mstarted to take every other day and denies myalgias. Her cholesterol is at goal. The cholesterol last visit was:   Lab Results  Component Value Date   CHOL 195 05/03/2017   HDL 72 05/03/2017   LDLCALC 110 (H) 05/03/2017   TRIG 63 05/03/2017   CHOLHDL 2.7 05/03/2017   She has been working on diet  and exercise for prediabetes,  and denies paresthesia of the feet, polydipsia, polyuria and visual disturbances. Last A1C in the office was:  Lab Results  Component Value Date   HGBA1C 5.6 05/03/2017   Patient is on Vitamin D supplement.   Lab Results  Component Value Date   VD25OH 65546/15/2018     She follows with Dr. MaWatt Climesshe is on entocort 84m57mas been taking 2 a day, she has been having some diarrhea, normal colonoscopy 09/2014.  She is on thyroid medication. Her medication was not changed last visit.   Lab Results  Component Value Date   TSH 1.01 05/03/2017   BMI is Body mass index is 17.08 kg/m., she is working on diet and exercise. Wt Readings from Last 3 Encounters:  08/27/17 104 lb 3.2 oz (47.3 kg)  08/06/17 106 lb 3.2 oz (48.2 kg)  06/13/17 105 lb 12.8 oz (48 kg)     Current Medications:  Current Outpatient Prescriptions on File Prior to Visit  Medication Sig Dispense Refill  . acidophilus (RISAQUAD) CAPS capsule Take 1 capsule by mouth daily.    . aMarland Kitchenpirin EC 81 MG tablet Take 81 mg by mouth at bedtime.    . Biotin 1 MG CAPS Take 1 mg by mouth daily.    . bisoprolol-hydrochlorothiazide (ZIAC) 2.5-6.25 MG tablet Take 1 tablet by mouth every other day.    .Marland Kitchen  budesonide (ENTOCORT EC) 3 MG 24 hr capsule Take 3 mg by mouth daily.    . Calcium Carbonate-Vitamin D (CALCIUM 600+D HIGH POTENCY) 600-400 MG-UNIT tablet Take 1 tablet by mouth daily.    . cetirizine (ZYRTEC) 10 MG tablet Take 10 mg by mouth daily.     . Cholecalciferol (VITAMIN D3) 2000 UNITS TABS Take 2,000 Units by mouth daily.     . Cinnamon 500 MG TABS Take 500 mg by mouth daily.    . cyclobenzaprine (FLEXERIL) 5 MG tablet Take 1 tablet (5 mg total) by mouth 3 (three) times daily as needed for muscle spasms. 30 tablet 0  . famotidine (PEPCID) 40 MG tablet TAKE 1 TABLET(40 MG) BY MOUTH EVERY EVENING 90 tablet 1  . Flaxseed, Linseed, (FLAX SEED OIL) 1000 MG CAPS Take 1,000 mg by mouth daily.     Marland Kitchen  levothyroxine (SYNTHROID, LEVOTHROID) 125 MCG tablet Take 62.5-125 mcg by mouth daily before breakfast. Pt takes one tablet Monday, Wednesday, and Friday.   Pt takes one-half tablet Tuesday, Thursday, Saturday, and Sunday.    . levothyroxine (SYNTHROID, LEVOTHROID) 125 MCG tablet TAKE 1 TABLET BY MOUTH DAILY BEFORE BREAKFAST 90 tablet 1  . Multiple Vitamins-Minerals (MULTIVITAMIN WITH MINERALS) tablet Take 1 tablet by mouth daily.    Marland Kitchen omega-3 acid ethyl esters (LOVAZA) 1 g capsule Take 1 g by mouth daily.    . pantoprazole (PROTONIX) 40 MG tablet Take 1 tablet (40 mg total) by mouth daily. 30 tablet 3  . simvastatin (ZOCOR) 20 MG tablet TAKE 1 TABLET BY MOUTH DAILY (Patient taking differently: Take 1 tablet by mouth at bedtime) 90 tablet 1  . vitamin C (ASCORBIC ACID) 500 MG tablet Take 500 mg by mouth daily.     No current facility-administered medications on file prior to visit.     Medical History:  Past Medical History:  Diagnosis Date  . Arthritis   . Benign labile hypertension   . Colitis   . DJD (degenerative joint disease)   . Endometrial polyp   . GERD (gastroesophageal reflux disease)   . History of basal cell carcinoma excision   . History of esophagitis   . Hyperlipidemia   . Hypothyroidism   . Osteopenia   . Prediabetes   . Vitamin D deficiency   . Wears glasses    Allergies Allergies  Allergen Reactions  . Fosamax [Alendronate Sodium] Other (See Comments)    Reaction:  GI upset   . Singulair [Montelukast Sodium] Other (See Comments)    Reaction:  Makes pt jittery  . Wellbutrin [Bupropion] Hives  . Sulfa Antibiotics Rash   Surgical History: reviewed and unchanged Family History: reviewed and unchanged Social History: reviewed and unchanged  Review of Systems: Review of Systems  Constitutional: Negative for chills, diaphoresis, fever, malaise/fatigue and weight loss.  HENT: Negative for congestion, ear discharge, ear pain, hearing loss, nosebleeds, sore  throat and tinnitus.   Eyes: Negative.   Respiratory: Negative.  Negative for stridor.   Cardiovascular: Negative.   Gastrointestinal: Negative for abdominal pain, blood in stool, constipation, diarrhea, heartburn, melena, nausea and vomiting.  Genitourinary: Negative.   Musculoskeletal: Negative for back pain, falls, joint pain, myalgias and neck pain.  Skin: Negative for itching and rash.  Neurological: Positive for headaches. Negative for dizziness, tingling, tremors, sensory change, speech change, focal weakness, seizures, loss of consciousness and weakness.  Psychiatric/Behavioral: Negative.     Physical Exam: Estimated body mass index is 17.08 kg/m as calculated from the following:  Height as of this encounter: 5' 5.5" (1.664 m).   Weight as of this encounter: 104 lb 3.2 oz (47.3 kg). BP 130/80   Pulse 71   Temp (!) 97.5 F (36.4 C)   Resp 16   Ht 5' 5.5" (1.664 m)   Wt 104 lb 3.2 oz (47.3 kg)   SpO2 97%   BMI 17.08 kg/m  General Appearance: Well nourished, in no apparent distress.  Eyes: PERRLA, EOMs, conjunctiva no swelling or erythema, normal fundi and vessels.  Sinuses: No Frontal/maxillary tenderness  ENT/Mouth: Ext aud canals clear, normal light reflex with TMs without erythema, bulging. Good dentition. No erythema, swelling, or exudate on post pharynx. Tonsils not swollen or erythematous. Hearing normal.  Neck: Supple, thyroid normal. No bruits  Respiratory: Respiratory effort normal, BS equal bilaterally without rales, rhonchi, wheezing or stridor.  Cardio: RRR without murmurs, rubs or gallops. Brisk peripheral pulses without edema.  Chest: symmetric, with normal excursions and percussion.  Abdomen: Soft, nontender, no guarding, rebound, hernias, masses, or organomegaly. .  Lymphatics: Non tender without lymphadenopathy. Musculoskeletal: Full ROM all peripheral extremities,5/5 strength, and normal gait.  Skin:  Warm, dry without rashes, lesions,  ecchymosis. Neuro: Cranial nerves intact, reflexes equal bilaterally. Normal muscle tone, no cerebellar symptoms. Sensation intact.  Psych: Awake and oriented X 3, normal affect, Insight and Judgment appropriate.   Vicie Mutters 10:27 AM [facility]

## 2017-08-27 ENCOUNTER — Encounter: Payer: Self-pay | Admitting: Physician Assistant

## 2017-08-27 ENCOUNTER — Ambulatory Visit (INDEPENDENT_AMBULATORY_CARE_PROVIDER_SITE_OTHER): Payer: Medicare Other | Admitting: Physician Assistant

## 2017-08-27 VITALS — BP 130/80 | HR 71 | Temp 97.5°F | Resp 16 | Ht 65.5 in | Wt 104.2 lb

## 2017-08-27 DIAGNOSIS — G44209 Tension-type headache, unspecified, not intractable: Secondary | ICD-10-CM | POA: Diagnosis not present

## 2017-08-27 DIAGNOSIS — R7303 Prediabetes: Secondary | ICD-10-CM | POA: Diagnosis not present

## 2017-08-27 DIAGNOSIS — E782 Mixed hyperlipidemia: Secondary | ICD-10-CM | POA: Diagnosis not present

## 2017-08-27 DIAGNOSIS — I10 Essential (primary) hypertension: Secondary | ICD-10-CM

## 2017-08-27 DIAGNOSIS — Z23 Encounter for immunization: Secondary | ICD-10-CM | POA: Diagnosis not present

## 2017-08-27 DIAGNOSIS — E039 Hypothyroidism, unspecified: Secondary | ICD-10-CM | POA: Diagnosis not present

## 2017-08-27 DIAGNOSIS — Z79899 Other long term (current) drug therapy: Secondary | ICD-10-CM

## 2017-08-27 LAB — BASIC METABOLIC PANEL WITH GFR
BUN: 11 mg/dL (ref 7–25)
CO2: 25 mmol/L (ref 20–32)
Calcium: 9.5 mg/dL (ref 8.6–10.4)
Chloride: 103 mmol/L (ref 98–110)
Creat: 0.88 mg/dL (ref 0.60–0.93)
GFR, EST AFRICAN AMERICAN: 74 mL/min/{1.73_m2} (ref >=60)
GFR, EST NON AFRICAN AMERICAN: 64 mL/min/{1.73_m2} (ref >=60)
Glucose, Bld: 81 mg/dL (ref 65–99)
Potassium: 4.3 mmol/L (ref 3.5–5.3)
Sodium: 139 mmol/L (ref 135–146)

## 2017-08-27 LAB — TSH: TSH: 0.31 mIU/L — ABNORMAL LOW (ref 0.40–4.50)

## 2017-08-27 LAB — LIPID PANEL
CHOL/HDL RATIO: 2.7 (calc) (ref <5.0)
CHOLESTEROL: 184 mg/dL (ref <200)
HDL: 68 mg/dL (ref >50)
LDL Cholesterol (Calc): 99 mg/dL (calc)
Non-HDL Cholesterol (Calc): 116 mg/dL (calc) (ref <130)
Triglycerides: 81 mg/dL (ref <150)

## 2017-08-27 LAB — CBC WITH DIFFERENTIAL/PLATELET
BASOS PCT: 0.6 %
Basophils Absolute: 39 cells/uL (ref 0–200)
EOS ABS: 130 {cells}/uL (ref 15–500)
Eosinophils Relative: 2 %
HEMATOCRIT: 40.2 % (ref 35.0–45.0)
Hemoglobin: 13.9 g/dL (ref 11.7–15.5)
Lymphs Abs: 1151 cells/uL (ref 850–3900)
MCH: 31.9 pg (ref 27.0–33.0)
MCHC: 34.6 g/dL (ref 32.0–36.0)
MCV: 92.2 fL (ref 80.0–100.0)
MPV: 11.2 fL (ref 7.5–12.5)
Monocytes Relative: 8.6 %
NEUTROS PCT: 71.1 %
Neutro Abs: 4622 cells/uL (ref 1500–7800)
PLATELETS: 183 10*3/uL (ref 140–400)
RBC: 4.36 10*6/uL (ref 3.80–5.10)
RDW: 13.2 % (ref 11.0–15.0)
TOTAL LYMPHOCYTE: 17.7 %
WBC: 6.5 10*3/uL (ref 3.8–10.8)
WBCMIX: 559 {cells}/uL (ref 200–950)

## 2017-08-27 LAB — HEPATIC FUNCTION PANEL
AG Ratio: 2.1 (calc) (ref 1.0–2.5)
ALBUMIN MSPROF: 4.5 g/dL (ref 3.6–5.1)
ALT: 18 U/L (ref 6–29)
AST: 21 U/L (ref 10–35)
Alkaline phosphatase (APISO): 60 U/L (ref 33–130)
BILIRUBIN DIRECT: 0.1 mg/dL (ref 0.0–0.2)
BILIRUBIN TOTAL: 0.5 mg/dL (ref 0.2–1.2)
GLOBULIN: 2.1 g/dL (ref 1.9–3.7)
Indirect Bilirubin: 0.4 mg/dL (calc) (ref 0.2–1.2)
Total Protein: 6.6 g/dL (ref 6.1–8.1)

## 2017-08-27 LAB — MAGNESIUM: Magnesium: 1.9 mg/dL (ref 1.5–2.5)

## 2017-08-27 NOTE — Patient Instructions (Addendum)
Amitriptyline 1/2-1 at night for neck pain If this does not help we can try topamax.   Please remember, common headache triggers are: sleep deprivation, dehydration, overheating, stress, hypoglycemia or skipping meals and blood sugar fluctuations, excessive pain medications or excessive alcohol use or caffeine withdrawal.   Some people have food triggers such as aged cheese, orange juice or chocolate, especially dark chocolate, or MSG (monosodium glutamate). Try to avoid these headache triggers as much possible.   It may be helpful to keep a headache diary to figure out what makes your headaches worse or brings them on and what alleviates them. Some people report headache onset after exercise but studies have shown that regular exercise may actually prevent headaches from coming. If you have exercise-induced headaches, please make sure that you drink plenty of fluid before and after exercising and that you do not over do it and do not overheat.  Also you can try CoQ10 12m TID or B2 40101ma day as prevention.   Also there is such a thing called rebound headache from over use of acute medications.  Please do not use rescue or acute medications more than 10 days a month or more than 3 days per week, this can cause a withdrawal and a rebound headache.   Please go to the ER if there is weakness, thunderclap headache, visual changes, or any concerning factors   Your ears and sinuses are connected by the eustachian tube. When your sinuses are inflamed, this can close off the tube and cause fluid to collect in your middle ear. This can then cause dizziness, popping, clicking, ringing, and echoing in your ears. This is often NOT an infection and does NOT require antibiotics, it is caused by inflammation so the treatments help the inflammation. This can take a long time to get better so please be patient.  Here are things you can do to help with this: - Try the Flonase or Nasonex. Remember to spray each  nostril twice towards the outer part of your eye.  Do not sniff but instead pinch your nose and tilt your head back to help the medicine get into your sinuses.  The best time to do this is at bedtime.Stop if you get blurred vision or nose bleeds.  -While drinking fluids, pinch and hold nose close and swallow, to help open eustachian tubes to drain fluid behind ear drums. -Please pick one of the over the counter allergy medications below and take it once daily for allergies.  It will also help with fluid behind ear drums. Claritin or loratadine cheapest but likely the weakest  Zyrtec or certizine at night because it can make you sleepy The strongest is allegra or fexafinadine  Cheapest at [employer], [employer], [employer] -can use decongestant over the counter, please do not use if you have high blood pressure or certain heart conditions.   if worsening HA, changes vision/speech, imbalance, weakness go to the ER

## 2017-10-04 ENCOUNTER — Ambulatory Visit: Payer: Medicare Other

## 2017-10-04 VITALS — Wt 107.4 lb

## 2017-10-04 DIAGNOSIS — E039 Hypothyroidism, unspecified: Secondary | ICD-10-CM

## 2017-10-04 LAB — TSH: TSH: 14.28 mIU/L — ABNORMAL HIGH (ref 0.40–4.50)

## 2017-10-04 NOTE — Progress Notes (Unsigned)
Pt presents for TSH check, pt states she takes 1/2 tablet of her LEVOTHYROXINE every day at this time.

## 2017-11-13 ENCOUNTER — Ambulatory Visit (INDEPENDENT_AMBULATORY_CARE_PROVIDER_SITE_OTHER): Payer: Medicare Other | Admitting: Physician Assistant

## 2017-11-13 ENCOUNTER — Encounter: Payer: Self-pay | Admitting: Physician Assistant

## 2017-11-13 VITALS — BP 130/80 | HR 76 | Temp 97.7°F | Resp 14 | Ht 65.5 in | Wt 108.8 lb

## 2017-11-13 DIAGNOSIS — Z0001 Encounter for general adult medical examination with abnormal findings: Secondary | ICD-10-CM

## 2017-11-13 DIAGNOSIS — N760 Acute vaginitis: Secondary | ICD-10-CM

## 2017-11-13 DIAGNOSIS — I10 Essential (primary) hypertension: Secondary | ICD-10-CM

## 2017-11-13 DIAGNOSIS — M8589 Other specified disorders of bone density and structure, multiple sites: Secondary | ICD-10-CM | POA: Diagnosis not present

## 2017-11-13 DIAGNOSIS — K2100 Gastro-esophageal reflux disease with esophagitis, without bleeding: Secondary | ICD-10-CM

## 2017-11-13 DIAGNOSIS — J45909 Unspecified asthma, uncomplicated: Secondary | ICD-10-CM

## 2017-11-13 DIAGNOSIS — E782 Mixed hyperlipidemia: Secondary | ICD-10-CM | POA: Diagnosis not present

## 2017-11-13 DIAGNOSIS — D649 Anemia, unspecified: Secondary | ICD-10-CM | POA: Diagnosis not present

## 2017-11-13 DIAGNOSIS — E559 Vitamin D deficiency, unspecified: Secondary | ICD-10-CM | POA: Diagnosis not present

## 2017-11-13 DIAGNOSIS — K529 Noninfective gastroenteritis and colitis, unspecified: Secondary | ICD-10-CM | POA: Diagnosis not present

## 2017-11-13 DIAGNOSIS — Z0000 Encounter for general adult medical examination without abnormal findings: Secondary | ICD-10-CM

## 2017-11-13 DIAGNOSIS — R6889 Other general symptoms and signs: Secondary | ICD-10-CM

## 2017-11-13 DIAGNOSIS — Z79899 Other long term (current) drug therapy: Secondary | ICD-10-CM | POA: Diagnosis not present

## 2017-11-13 DIAGNOSIS — M1990 Unspecified osteoarthritis, unspecified site: Secondary | ICD-10-CM

## 2017-11-13 DIAGNOSIS — R350 Frequency of micturition: Secondary | ICD-10-CM

## 2017-11-13 DIAGNOSIS — R7303 Prediabetes: Secondary | ICD-10-CM | POA: Diagnosis not present

## 2017-11-13 DIAGNOSIS — E039 Hypothyroidism, unspecified: Secondary | ICD-10-CM | POA: Diagnosis not present

## 2017-11-13 MED ORDER — NYSTATIN 100000 UNIT/GM EX OINT: 1.0000 "application " | TOPICAL_OINTMENT | Freq: Two times a day (BID) | CUTANEOUS | 0 refills | Status: DC

## 2017-11-13 MED ORDER — TRIAMCINOLONE ACETONIDE 0.1 % EX OINT: 1.0000 "application " | TOPICAL_OINTMENT | Freq: Two times a day (BID) | CUTANEOUS | 1 refills | Status: DC

## 2017-11-13 NOTE — Progress Notes (Signed)
MEDICARE ANNUAL WELLNESS VISIT AND FOLLOW UP  Assessment:    Essential hypertension - continue medications, DASH diet, exercise and monitor at home. Call if greater than 130/80.   Hypothyroidism, unspecified type Hypothyroidism-check TSH level, continue medications the same, reminded to take on an empty stomach 30-88mns before food.  -     TSH  Mixed hyperlipidemia -continue medications, check lipids, decrease fatty foods, increase activity.   Prediabetes Monitor  Medication management  Anemia, unspecified type - monitor, continue iron supp with Vitamin C and increase green leafy veggies  Vitamin D deficiency Continue supplement  Osteopenia of multiple sites Due DEXA 2019  Osteoarthritis, unspecified osteoarthritis type, unspecified site Monitor  Colitis Continue follow up GI  GERD Continue PPI/H2 blocker, diet discussed  Uncomplicated asthma, unspecified asthma severity, unspecified whether persistent Controlled, avoid triggers  Acute vaginitis + vaginal discharge, thin, foul smelling, no lesion/no blood Small cystocele -     WET PREP BY MOLECULAR PROBE -     nystatin ointment (MYCOSTATIN); Apply 1 application topically 2 (two) times daily. -     triamcinolone ointment (KENALOG) 0.1 %; Apply 1 application topically 2 (two) times daily.  Urinary frequency -     Urinalysis, Routine w reflex microscopic -     Urine Culture  Over 30 minutes of exam, counseling, chart review, and critical decision making was performed  Future Appointments Future Appointments  Date Time Provider DAshland 01/09/2018  2:00 PM CVicie Mutters PA-C [facility] None     Plan:   During the course of the visit the patient was educated and counseled about appropriate screening and preventive services including:    Pneumococcal vaccine   Influenza vaccine  Td vaccine  Prevnar 13  Screening electrocardiogram  Screening mammography  Bone densitometry  screening  Colorectal cancer screening  Diabetes screening  Glaucoma screening  Nutrition counseling   Advanced directives: given info/requested copies   Subjective:   Debbie KEMPAis a 76y.o. female who presents for Medicare Annual Wellness Visit and 3 month follow up on hypertension, prediabetes, hyperlipidemia, vitamin D def.   She has been having suprapubic pressure and foul smelling vaginal discharge x several weeks, getting worse. Has felt fatigued and not good. She does feel a pulling and feels like it is "falling out" she has some incontinence. She had [place]vaginal [place], saw Dr. MRadene Knee had normal normal endometrial sampling.  showing IMPRESSION: Mild endometrial thickening with internal cystic change which may suggest a focal lesion such as a polyp although submucosal fibroid or hyperplasia/ neoplasia could appear similar. This could also represent tamoxifen effect. Endometrial thickness is considered abnormal for an asymptomatic post-menopausal female. Endometrial sampling should be considered to exclude carcinoma.  Her blood pressure has been controlled at home, today their BP is BP: 130/80  She does workout 3 x a week. She denies chest pain, shortness of breath, dizziness.  She is on cholesterol medication and denies myalgias. Her cholesterol is not at goal. The cholesterol last visit was:   Lab Results  Component Value Date   CHOL 184 08/27/2017   HDL 68 08/27/2017   LDLCALC 110 (H) 05/03/2017   TRIG 81 08/27/2017   CHOLHDL 2.7 08/27/2017    She has been working on diet and exercise for prediabetes, and denies paresthesia of the feet, polydipsia, polyuria and visual disturbances. Last A1C in the office was:  Lab Results  Component Value Date   HGBA1C 5.6 05/03/2017   Patient is on Vitamin D supplement.  Lab Results  Component Value Date   VD25OH 30 05/03/2017     She is on thyroid medication. Her medication was changed last visit. Tues and  Thurs, she is on a whole and the rest is a half, moved up from 1/2 pill a day, she remains cold.    Lab Results  Component Value Date   TSH 14.28 (H) 10/04/2017  .  BMI is Body mass index is 17.83 kg/m., she is working on diet and exercise. Wt Readings from Last 3 Encounters:  11/13/17 108 lb 12.8 oz (49.4 kg)  10/04/17 107 lb 6.4 oz (48.7 kg)  08/27/17 104 lb 3.2 oz (47.3 kg)    Medication Review Current Outpatient Medications on File Prior to Visit  Medication Sig  . acidophilus (RISAQUAD) CAPS capsule Take 1 capsule by mouth daily.  Marland Kitchen aspirin EC 81 MG tablet Take 81 mg by mouth at bedtime.  . Biotin 1 MG CAPS Take 1 mg by mouth daily.  . bisoprolol-hydrochlorothiazide (ZIAC) 2.5-6.25 MG tablet Take 1 tablet by mouth every other day.  . budesonide (ENTOCORT EC) 3 MG 24 hr capsule Take 3 mg by mouth daily.  . Calcium Carbonate-Vitamin D (CALCIUM 600+D HIGH POTENCY) 600-400 MG-UNIT tablet Take 1 tablet by mouth daily.  . cetirizine (ZYRTEC) 10 MG tablet Take 10 mg by mouth daily.   . Cholecalciferol (VITAMIN D3) 2000 UNITS TABS Take 2,000 Units by mouth daily.   . Cinnamon 500 MG TABS Take 500 mg by mouth daily.  . famotidine (PEPCID) 40 MG tablet TAKE 1 TABLET(40 MG) BY MOUTH EVERY EVENING  . Flaxseed, Linseed, (FLAX SEED OIL) 1000 MG CAPS Take 1,000 mg by mouth daily.   Marland Kitchen levothyroxine (SYNTHROID, LEVOTHROID) 125 MCG tablet Take 62.5-125 mcg by mouth daily before breakfast. Pt takes one tablet Monday, Wednesday, and Friday.   Pt takes one-half tablet Tuesday, Thursday, Saturday, and Sunday.  Marland Kitchen MAGNESIUM PO Take 50 mg by mouth.  . Multiple Vitamins-Minerals (MULTIVITAMIN WITH MINERALS) tablet Take 1 tablet by mouth daily.  Marland Kitchen omega-3 acid ethyl esters (LOVAZA) 1 g capsule Take 1 g by mouth daily.  . pantoprazole (PROTONIX) 40 MG tablet Take 1 tablet (40 mg total) by mouth daily.  . simvastatin (ZOCOR) 20 MG tablet TAKE 1 TABLET BY MOUTH DAILY (Patient taking differently: Take 1  tablet by mouth at bedtime)  . vitamin C (ASCORBIC ACID) 500 MG tablet Take 500 mg by mouth daily.   No current facility-administered medications on file prior to visit.      Current Problems (verified) has Essential hypertension; Hyperlipidemia; Hypothyroid; GERD; Asthma; DJD (degenerative joint disease); Vitamin D deficiency; Osteopenia; Allergy; Anemia; Prediabetes; Medication management; Colitis; and Encounter for Medicare annual wellness exam on their problem list.   Screening Tests Immunization History  Administered Date(s) Administered  . Influenza, High Dose Seasonal PF 07/23/2014, 08/23/2015, 07/10/2016, 08/27/2017  . Pneumococcal Conjugate-13 05/05/2015  . Pneumococcal-Unspecified 08/22/2011  . Tdap 08/22/2011  . Zoster 06/20/2006  . Zoster Recombinat (Shingrix) 09/13/2017    Preventative care: Last colonoscopy: 2015 Last mammogram: 11/30/2016 [facility]   DEXA: 11/05/2016 PAP 2012 never abnormal  Names of Other Physician/Practitioners you currently use: 1. [facility]- here for primary care 2. Dr. Sabra Heck, eye doctor, last visit  2018 3. [facility] , dentist, last visit 2018 Patient Care Team: Unk Pinto, MD as PCP - General (Internal Medicine)  Allergies Allergies  Allergen Reactions  . Fosamax [Alendronate Sodium] Other (See Comments)    Reaction:  GI upset   . Singulair [Montelukast Sodium] Other (See Comments)    Reaction:  Makes pt jittery  . Wellbutrin [Bupropion] Hives  . Sulfa Antibiotics Rash    SURGICAL HISTORY She  has a past surgical history that includes Mohs surgery (2005); Tonsillectomy (age 47); Dilation and curettage of uterus; and Dilatation & curettage/hysteroscopy with myosure (N/A, 10/27/2014). FAMILY HISTORY Her family history includes Cancer in her father, maternal grandmother, mother, and sister; Diabetes in her brother and maternal grandfather; Hyperlipidemia in her brother, father, maternal  grandfather, maternal grandmother, mother, paternal grandfather, paternal grandmother, and sister; Hypertension in her mother; Stroke in her maternal grandfather. SOCIAL HISTORY She  reports that she has been smoking cigarettes.  She has smoked for the past 15.00 years. she has never used smokeless tobacco. She reports that she drinks alcohol. She reports that she does not use drugs.   MEDICARE WELLNESS OBJECTIVES: Physical activity: Current Exercise Habits: Home exercise routine, Type of exercise: walking, Time (Minutes): 30, Frequency (Times/Week): 3, Weekly Exercise (Minutes/Week): 90, Intensity: Moderate Cardiac risk factors: Cardiac Risk Factors include: advanced age (>26mn, >>26women);dyslipidemia;hypertension Depression/mood screen:   Depression screen [facility] 11/13/2017  Decreased Interest 0  Down, Depressed, Hopeless 0  PHQ - 2 Score 0    ADLs:  In your present state of health, do you have any difficulty performing the following activities: 11/13/2017 05/04/2017  Hearing? N N  Vision? N N  Difficulty concentrating or making decisions? N N  Walking or climbing stairs? N N  Dressing or bathing? N N  Doing errands, shopping? N N  Some recent data might be hidden     Cognitive Testing  Alert? Yes  Normal Appearance?Yes  Oriented to person? Yes  Place? Yes   Time? Yes  Recall of three objects?  Yes  Can perform simple calculations? Yes  Displays appropriate judgment?Yes  Can read the correct time from a watch face?Yes  EOL planning: Does Patient Have a Medical Advance Directive?: Yes Type of Advance Directive: Healthcare Power of Attorney, Living will [place] No - copy requested     Objective:   Today's Vitals   11/13/17 1500  BP: 130/80  Pulse: 76  Resp: 14  Temp: 97.7 F (36.5 C)  SpO2: 97%  Weight: 108 lb 12.8 oz (49.4 kg)  Height: 5' 5.5" (1.664 m)  PainSc: 1    .  General appearance: alert, no distress, WD/WN,   female HEENT: normocephalic, sclerae anicteric, TMs pearly, nares patent, no discharge or erythema, pharynx normal Oral cavity: MMM, no lesions Neck: supple, no lymphadenopathy, no thyromegaly, no masses Heart: RRR, normal S1, S2, no murmurs Lungs: CTA bilaterally, no wheezes, rhonchi, or rales Abdomen: +bs, soft, non tender, non distended, no masses, no hepatomegaly, no splenomegaly Musculoskeletal: nontender, no swelling, no obvious deformity Extremities: no edema, no cyanosis, no clubbing Pulses: 2+ symmetric, upper and lower extremities, normal cap refill Neurological: alert, oriented x 3, CN2-12 intact, strength normal upper extremities and lower extremities, sensation normal throughout, DTRs 2+ throughout, no cerebellar signs, gait normal Psychiatric: normal affect, behavior normal, pleasant GLFY:BOFBP normal appearing vulva with no masses, tenderness or lesions, VAGINA: atrophic, PELVIC FLOOR EXAM: cystocele small, vaginal erythema at urethra, vaginal discharge - clear and malodorous.   Medicare Attestation I have personally reviewed: The patient's medical and social history Their use of alcohol, tobacco or illicit drugs Their current medications and supplements The patient's functional ability including ADLs,fall risks, home safety risks, cognitive,  and hearing and visual impairment Diet and physical activities Evidence for depression or mood disorders  The patient's weight, height, BMI, and visual acuity have been recorded in the chart.  I have made referrals, counseling, and provided education to the patient based on review of the above and I have provided the patient with a written personalized care plan for preventive services.    Vicie Mutters, PA-C

## 2017-11-13 NOTE — Patient Instructions (Addendum)
Your ears and sinuses are connected by the eustachian tube. When your sinuses are inflamed, this can close off the tube and cause fluid to collect in your middle ear. This can then cause dizziness, popping, clicking, ringing, and echoing in your ears. This is often NOT an infection and does NOT require antibiotics, it is caused by inflammation so the treatments help the inflammation. This can take a long time to get better so please be patient.  Here are things you can do to help with this: - Try the Flonase or Nasonex. Remember to spray each nostril twice towards the outer part of your eye.  Do not sniff but instead pinch your nose and tilt your head back to help the medicine get into your sinuses.  The best time to do this is at bedtime.Stop if you get blurred vision or nose bleeds.  -While drinking fluids, pinch and hold nose close and swallow, to help open eustachian tubes to drain fluid behind ear drums. -Please pick one of the over the counter allergy medications below and take it once daily for allergies.  It will also help with fluid behind ear drums. Claritin or loratadine cheapest but likely the weakest  Zyrtec or certizine at night because it can make you sleepy The strongest is allegra or fexafinadine  Cheapest at [facility], [employer], [employer] -can use decongestant over the counter, please do not use if you have high blood pressure or certain heart conditions.   if worsening HA, changes vision/speech, imbalance, weakness go to the ER    Can use nystatin and triamcinolone together twice a day for 7-10 days Use more of the nystatin and less of the triamcinolone externally   Vaginitis Vaginitis is a condition in which the vaginal tissue swells and becomes red (inflamed). This condition is most often caused by a change in the normal balance of bacteria and yeast that live in the vagina. This change causes an overgrowth of certain bacteria or yeast, which causes the inflammation. There are  different types of vaginitis, but the most common types are:  Bacterial vaginosis.  Yeast infection (candidiasis).  Trichomoniasis vaginitis. This is a sexually transmitted disease (STD).  Viral vaginitis.  Atrophic vaginitis.  Allergic vaginitis.  What are the causes? The cause of this condition depends on the type of vaginitis. It can be caused by:  Bacteria (bacterial vaginosis).  Yeast, which is a fungus (yeast infection).  A parasite (trichomoniasis vaginitis).  A virus (viral vaginitis).  Low hormone levels (atrophic vaginitis). Low hormone levels can occur during pregnancy, breastfeeding, or after menopause.  Irritants, such as bubble baths, scented tampons, and feminine sprays (allergic vaginitis).  Other factors can change the normal balance of the yeast and bacteria that live in the vagina. These include:  Antibiotic medicines.  Poor hygiene.  Diaphragms, vaginal sponges, spermicides, birth control pills, and intrauterine devices (IUD).  Sex.  Infection.  Uncontrolled diabetes.  A weakened defense (immune) system.  What increases the risk? This condition is more likely to develop in women who:  Smoke.  Use vaginal douches, scented tampons, or scented sanitary pads.  Wear tight-fitting pants.  Wear thong underwear.  Use oral birth control pills or an IUD.  Have sex without a condom.  Have multiple sex partners.  Have an STD.  Frequently use the spermicide nonoxynol-9.  Eat lots of foods high in sugar.  Have uncontrolled diabetes.  Have low estrogen levels.  Have a weakened immune system from an immune disorder or medical treatment.  Are pregnant or breastfeeding.  What are the signs or symptoms? Symptoms vary depending on the cause of the vaginitis. Common symptoms include:  Abnormal vaginal discharge. ? The discharge is white, gray, or yellow with bacterial vaginosis. ? The discharge is thick, white, and cheesy with a yeast  infection. ? The discharge is frothy and yellow or greenish with trichomoniasis.  A bad vaginal smell. The smell is fishy with bacterial vaginosis.  Vaginal itching, pain, or swelling.  Sex that is painful.  Pain or burning when urinating.  Sometimes there are no symptoms. How is this diagnosed? This condition is diagnosed based on your symptoms and medical history. A physical exam, including a pelvic exam, will also be done. You may also have other tests, including:  Tests to determine the pH level (acidity or alkalinity) of your vagina.  A whiff test, to assess the odor that results when a sample of your vaginal discharge is mixed with a potassium hydroxide solution.  Tests of vaginal fluid. A sample will be examined under a microscope.  How is this treated? Treatment varies depending on the type of vaginitis you have. Your treatment may include:  Antibiotic creams or pills to treat bacterial vaginosis and trichomoniasis.  Antifungal medicines, such as vaginal creams or suppositories, to treat a yeast infection.  Medicine to ease discomfort if you have viral vaginitis. Your sexual partner should also be treated.  Estrogen delivered in a cream, pill, suppository, or vaginal ring to treat atrophic vaginitis. If vaginal dryness occurs, lubricants and moisturizing creams may help. You may need to avoid scented soaps, sprays, or douches.  Stopping use of a product that is causing allergic vaginitis. Then using a vaginal cream to treat the symptoms.  Follow these instructions at home: Lifestyle  Keep your genital area clean and dry. Avoid soap, and only rinse the area with water.  Do not douche or use tampons until your health care provider says it is okay to do so. Use sanitary pads, if needed.  Do not have sex until your health care provider approves. When you can return to sex, practice safe sex and use condoms.  Wipe from front to back. This avoids the spread of bacteria  from the rectum to the vagina. General instructions  Take over-the-counter and prescription medicines only as told by your health care provider.  If you were prescribed an antibiotic medicine, take or use it as told by your health care provider. Do not stop taking or using the antibiotic even if you start to feel better.  Keep all follow-up visits as told by your health care provider. This is important. How is this prevented?  Use mild, non-scented products. Do not use things that can irritate the vagina, such as fabric softeners. Avoid the following products if they are scented: ? Feminine sprays. ? Detergents. ? Tampons. ? Feminine hygiene products. ? Soaps or bubble baths.  Let air reach your genital area. ? Wear cotton underwear to reduce moisture buildup. ? Avoid wearing underwear while you sleep. ? Avoid wearing tight pants and underwear or nylons without a cotton panel. ? Avoid wearing thong underwear.  Take off any wet clothing, such as bathing suits, as soon as possible.  Practice safe sex and use condoms. Contact a health care provider if:  You have abdominal pain.  You have a fever.  You have symptoms that last for more than 2-3 days. Get help right away if:  You have a fever and your symptoms suddenly get  worse. Summary  Vaginitis is a condition in which the vaginal tissue becomes inflamed.This condition is most often caused by a change in the normal balance of bacteria and yeast that live in the vagina.  Treatment varies depending on the type of vaginitis you have.  Do not douche, use tampons , or have sex until your health care provider approves. When you can return to sex, practice safe sex and use condoms. This information is not intended to replace advice given to you by your health care provider. Make sure you discuss any questions you have with your health care provider. Document Released: 09/02/2007 Document Revised: 12/11/2016 Document Reviewed:  12/11/2016 Elsevier Interactive Patient Education  2018 [employer].   About Cystocele  Overview  The pelvic organs, including the bladder, are normally supported by pelvic floor muscles and ligaments.  When these muscles and ligaments are stretched, weakened or torn, the wall between the bladder and the vagina sags or herniates causing a prolapse, sometimes called a cystocele.  This condition may cause discomfort and problems with emptying the bladder.  It can be present in various stages.  Some people are not aware of the changes.  Others may notice changes at the vaginal opening or a feeling of the bladder dropping outside the body.  Causes of a Cystocele  A cystocele is usually caused by muscle straining or stretching during childbirth.  In addition, cystocele is more common after menopause, because the hormone estrogen helps keep the elastic tissues around the pelvic organs strong.  A cystocele is more likely to occur when levels of estrogen decrease.  Other causes include: heavy lifting, chronic coughing, previous pelvic surgery and obesity.  Symptoms  A bladder that has dropped from its normal position may cause: unwanted urine leakage (stress incontinence), frequent urination or urge to urinate, incomplete emptying of the bladder (not feeling bladder relief after emptying), pain or discomfort in the vagina, pelvis, groin, lower back or lower abdomen and frequent urinary tract infections.  Mild cases may not cause any symptoms.  Treatment Options  Pelvic floor (Kegel) exercises:  Strength training the muscles in your genital area  Behavioral changes: Treating and preventing constipation, taking time to empty your bladder properly, learning to lift properly and/or avoid heavy lifting when possible, stopping smoking, avoiding weight gain and treating a chronic cough or bronchitis.  A pessary: A vaginal support device is sometimes used to help pelvic support caused by muscle and  ligament changes.  Surgery: Surgical repair may be necessary if symptoms cannot be managed with exercise, behavioral changes and a pessary.  Surgery is usually considered for severe cases.   2007, [employer]

## 2017-11-14 LAB — URINALYSIS, ROUTINE W REFLEX MICROSCOPIC
Bacteria, UA: NONE SEEN /HPF
Bilirubin Urine: NEGATIVE
GLUCOSE, UA: NEGATIVE
HGB URINE DIPSTICK: NEGATIVE
Hyaline Cast: NONE SEEN /LPF
KETONES UR: NEGATIVE
NITRITE: NEGATIVE
PH: 7 (ref 5.0–8.0)
Protein, ur: NEGATIVE
SPECIFIC GRAVITY, URINE: 1.01 (ref 1.001–1.03)
Squamous Epithelial / LPF: NONE SEEN /HPF (ref <=5)

## 2017-11-14 LAB — URINE CULTURE
MICRO NUMBER:: 81449682
RESULT: NO GROWTH
SPECIMEN QUALITY:: ADEQUATE

## 2017-11-14 LAB — WET PREP BY MOLECULAR PROBE
CANDIDA SPECIES: NOT DETECTED
GARDNERELLA VAGINALIS: NOT DETECTED
MICRO NUMBER:: 81449582
SPECIMEN QUALITY:: ADEQUATE
Trichomonas vaginosis: NOT DETECTED

## 2017-11-14 LAB — TSH: TSH: 1.23 m[IU]/L (ref 0.40–4.50)

## 2017-12-02 DIAGNOSIS — Z803 Family history of malignant neoplasm of breast: Secondary | ICD-10-CM | POA: Diagnosis not present

## 2017-12-02 DIAGNOSIS — Z1231 Encounter for screening mammogram for malignant neoplasm of breast: Secondary | ICD-10-CM | POA: Diagnosis not present

## 2017-12-02 LAB — HM MAMMOGRAPHY

## 2018-01-05 ENCOUNTER — Other Ambulatory Visit: Payer: Self-pay | Admitting: Internal Medicine

## 2018-01-05 DIAGNOSIS — I10 Essential (primary) hypertension: Secondary | ICD-10-CM

## 2018-01-09 ENCOUNTER — Ambulatory Visit (INDEPENDENT_AMBULATORY_CARE_PROVIDER_SITE_OTHER): Payer: Medicare Other | Admitting: Physician Assistant

## 2018-01-09 ENCOUNTER — Encounter: Payer: Self-pay | Admitting: Physician Assistant

## 2018-01-09 VITALS — BP 118/74 | HR 74 | Temp 97.8°F | Resp 14 | Ht 65.0 in | Wt 110.4 lb

## 2018-01-09 DIAGNOSIS — E782 Mixed hyperlipidemia: Secondary | ICD-10-CM | POA: Diagnosis not present

## 2018-01-09 DIAGNOSIS — J45909 Unspecified asthma, uncomplicated: Secondary | ICD-10-CM

## 2018-01-09 DIAGNOSIS — E039 Hypothyroidism, unspecified: Secondary | ICD-10-CM | POA: Diagnosis not present

## 2018-01-09 DIAGNOSIS — K529 Noninfective gastroenteritis and colitis, unspecified: Secondary | ICD-10-CM | POA: Diagnosis not present

## 2018-01-09 DIAGNOSIS — H9193 Unspecified hearing loss, bilateral: Secondary | ICD-10-CM

## 2018-01-09 DIAGNOSIS — M1990 Unspecified osteoarthritis, unspecified site: Secondary | ICD-10-CM

## 2018-01-09 DIAGNOSIS — R5383 Other fatigue: Secondary | ICD-10-CM

## 2018-01-09 DIAGNOSIS — M8589 Other specified disorders of bone density and structure, multiple sites: Secondary | ICD-10-CM

## 2018-01-09 DIAGNOSIS — Z681 Body mass index (BMI) 19 or less, adult: Secondary | ICD-10-CM | POA: Diagnosis not present

## 2018-01-09 DIAGNOSIS — Z79899 Other long term (current) drug therapy: Secondary | ICD-10-CM

## 2018-01-09 DIAGNOSIS — D649 Anemia, unspecified: Secondary | ICD-10-CM

## 2018-01-09 DIAGNOSIS — E559 Vitamin D deficiency, unspecified: Secondary | ICD-10-CM

## 2018-01-09 DIAGNOSIS — H6523 Chronic serous otitis media, bilateral: Secondary | ICD-10-CM

## 2018-01-09 DIAGNOSIS — I10 Essential (primary) hypertension: Secondary | ICD-10-CM

## 2018-01-09 DIAGNOSIS — T7840XA Allergy, unspecified, initial encounter: Secondary | ICD-10-CM

## 2018-01-09 NOTE — Progress Notes (Signed)
CPE AND FOLLOW UP  Assessment:    Essential hypertension - continue medications, DASH diet, exercise and monitor at home. Call if greater than 130/80.   Hypothyroidism, unspecified type Hypothyroidism-check TSH level, continue medications the same, reminded to take on an empty stomach 30-33mns before food.  -     TSH  Mixed hyperlipidemia -continue medications, check lipids, decrease fatty foods, increase activity.   Prediabetes Monitor  Medication management  Anemia, unspecified type - monitor, continue iron supp with Vitamin C and increase green leafy veggies  Vitamin D deficiency Continue supplement  Osteopenia of multiple sites Due DEXA 2019  Osteoarthritis, unspecified osteoarthritis type, unspecified site Monitor  Colitis Continue follow up GI  GERD Continue PPI/H2 blocker, diet discussed  Uncomplicated asthma, unspecified asthma severity, unspecified whether persistent Controlled, avoid triggers  Body mass index (BMI) less than 19 Add ensure  Decreased hearing of both ears with Bilateral chronic serous otitis media -     Ambulatory referral to ENT - ? Need hearing aids versus tubes  Fatigue, unspecified type -     Iron,Total/Total Iron Binding Cap -     Folate RBC -     Methylmalonic acid, serum -     Magnesium, RBC  Over 40 minutes of exam, counseling, chart review, and critical decision making was performed  Future Appointments Future Appointments  Date Time Provider DBurnet 01/12/2019  2:00 PM CVicie Mutters PA-C [id number] None      Subjective:   Debbie JUNIOis a 77y.o. female who presents for cpe and 3 month follow up on hypertension, prediabetes, hyperlipidemia, vitamin D def.   She has some incontinence, worse when she waits a long time, has history of cystocele.  She has diarrhea, on enocort.  She complains of bilateral hearing loss, left worse than right, does feel popping/clicking and fluid in her ears, she is  on flonase not helping.  Her blood pressure has been controlled at home, she is on BP every other day, today their BP is BP: 118/74  She does workout 3 x a week. She denies chest pain, shortness of breath, dizziness.  She is on cholesterol medication and denies myalgias. Her cholesterol is not at goal. The cholesterol last visit was:   Lab Results  Component Value Date   CHOL 184 08/27/2017   HDL 68 08/27/2017   LDLCALC 110 (H) 05/03/2017   TRIG 81 08/27/2017   CHOLHDL 2.7 08/27/2017    She has been working on diet and exercise for prediabetes, and denies paresthesia of the feet, polydipsia, polyuria and visual disturbances. Last A1C in the office was:  Lab Results  Component Value Date   HGBA1C 5.6 05/03/2017   Patient is on Vitamin D supplement.   Lab Results  Component Value Date   VD25OH 65306/15/2018     She is on thyroid medication. Her medication was not changed last visit. States she still feels tired, brittle hair, cold. Tues and Thurs, she is on a whole and the rest is a half, moved up from 1/2 pill a day.  Lab Results  Component Value Date   TSH 1.23 11/13/2017  .  BMI is Body mass index is 18.37 kg/m., she is working on diet and exercise. Wt Readings from Last 3 Encounters:  01/09/18 110 lb 6.4 oz (50.1 kg)  11/13/17 108 lb 12.8 oz (49.4 kg)  10/04/17 107 lb 6.4 oz (48.7 kg)    Medication Review Current Outpatient Medications on File Prior to  Visit  Medication Sig  . acidophilus (RISAQUAD) CAPS capsule Take 1 capsule by mouth daily.  Marland Kitchen aspirin EC 81 MG tablet Take 81 mg by mouth at bedtime.  . Biotin 1 MG CAPS Take 1 mg by mouth daily.  . bisoprolol-hydrochlorothiazide (ZIAC) 2.5-6.25 MG tablet TAKE 1 TABLET BY MOUTH EVERY DAY  . budesonide (ENTOCORT EC) 3 MG 24 hr capsule Take 3 mg by mouth daily.  . Calcium Carbonate-Vitamin D (CALCIUM 600+D HIGH POTENCY) 600-400 MG-UNIT tablet Take 1 tablet by mouth daily.  . cetirizine (ZYRTEC) 10 MG tablet Take 10 mg by  mouth daily.   . Cholecalciferol (VITAMIN D3) 2000 UNITS TABS Take 2,000 Units by mouth daily.   . Cinnamon 500 MG TABS Take 500 mg by mouth daily.  . famotidine (PEPCID) 40 MG tablet TAKE 1 TABLET(40 MG) BY MOUTH EVERY EVENING  . Flaxseed, Linseed, (FLAX SEED OIL) 1000 MG CAPS Take 1,000 mg by mouth daily.   Marland Kitchen levothyroxine (SYNTHROID, LEVOTHROID) 125 MCG tablet Take 62.5-125 mcg by mouth daily before breakfast. Pt takes one tablet Monday, Wednesday, and Friday.   Pt takes one-half tablet Tuesday, Thursday, Saturday, and Sunday.  Marland Kitchen MAGNESIUM PO Take 50 mg by mouth.  . Multiple Vitamins-Minerals (MULTIVITAMIN WITH MINERALS) tablet Take 1 tablet by mouth daily.  Marland Kitchen nystatin ointment (MYCOSTATIN) Apply 1 application topically 2 (two) times daily.  Marland Kitchen omega-3 acid ethyl esters (LOVAZA) 1 g capsule Take 1 g by mouth daily.  . pantoprazole (PROTONIX) 40 MG tablet Take 1 tablet (40 mg total) by mouth daily.  . simvastatin (ZOCOR) 20 MG tablet TAKE 1 TABLET BY MOUTH DAILY (Patient taking differently: Take 1 tablet by mouth at bedtime)  . triamcinolone ointment (KENALOG) 0.1 % Apply 1 application topically 2 (two) times daily.  . vitamin C (ASCORBIC ACID) 500 MG tablet Take 500 mg by mouth daily.   No current facility-administered medications on file prior to visit.      Current Problems (verified) has Essential hypertension; Hyperlipidemia; Hypothyroid; GERD; Asthma; DJD (degenerative joint disease); Vitamin D deficiency; Osteopenia; Allergy; Anemia; Medication management; and Colitis on their problem list.   Screening Tests Immunization History  Administered Date(s) Administered  . Influenza, High Dose Seasonal PF 07/23/2014, 08/23/2015, 07/10/2016, 08/27/2017  . Pneumococcal Conjugate-13 05/05/2015  . Pneumococcal-Unspecified 08/22/2011  . Tdap 08/22/2011  . Zoster 06/20/2006  . Zoster Recombinat (Shingrix) 09/13/2017, 01/01/2018   Up to date  Preventative care: Last colonoscopy:  2015 Last mammogram: 11/2017 [facility]   DEXA: 11/05/2016 PAP 2012 never abnormal  Names of Other Physician/Practitioners you currently use: 1. [facility]- here for primary care 2. Dr. Sabra Heck, eye doctor, last visit  2018 3. [facility] , dentist, last visit 2018 Patient Care Team: Unk Pinto, MD as PCP - General (Internal Medicine)  Allergies Allergies  Allergen Reactions  . Fosamax [Alendronate Sodium] Other (See Comments)    Reaction:  GI upset   . Singulair [Montelukast Sodium] Other (See Comments)    Reaction:  Makes pt jittery  . Wellbutrin [Bupropion] Hives  . Sulfa Antibiotics Rash    SURGICAL HISTORY She  has a past surgical history that includes Mohs surgery (2005); Tonsillectomy (age 72); Dilation and curettage of uterus; and Dilatation & curettage/hysteroscopy with myosure (N/A, 10/27/2014). FAMILY HISTORY Her family history includes Cancer in her father, maternal grandmother, mother, and sister; Diabetes in her brother and maternal grandfather; Hyperlipidemia in her brother, father, maternal grandfather, maternal grandmother, mother, paternal grandfather, paternal grandmother, and sister;  Hypertension in her mother; Stroke in her maternal grandfather. SOCIAL HISTORY She  reports that she has been smoking cigarettes.  She has smoked for the past 15.00 years. she has never used smokeless tobacco. She reports that she drinks alcohol. She reports that she does not use drugs.    Objective:   Today's Vitals   01/09/18 1403  BP: 118/74  Pulse: 74  Resp: 14  Temp: 97.8 F (36.6 C)  SpO2: 96%  Weight: 110 lb 6.4 oz (50.1 kg)  Height: 5' 5"  (1.651 m)   .  General appearance: alert, no distress, WD/WN,  female HEENT: normocephalic, sclerae anicteric, TMs pearly with effusion bilateral ears, nares patent, no discharge or erythema, pharynx normal Oral cavity: MMM, no lesions Neck: supple, no lymphadenopathy, no thyromegaly,  no masses Heart: RRR, normal S1, S2, no murmurs Lungs: CTA bilaterally, no wheezes, rhonchi, or rales Abdomen: +bs, soft, non tender, non distended, no masses, no hepatomegaly, no splenomegaly Musculoskeletal: nontender, no swelling, no obvious deformity Extremities: no edema, no cyanosis, no clubbing Pulses: 2+ symmetric, upper and lower extremities, normal cap refill Neurological: alert, oriented x 3, CN2-12 intact, strength normal upper extremities and lower extremities, sensation normal throughout, DTRs 2+ throughout, no cerebellar signs, gait normal Psychiatric: normal affect, behavior normal, pleasant   Vicie Mutters, PA-C

## 2018-01-09 NOTE — Patient Instructions (Signed)

## 2018-01-13 LAB — HEPATIC FUNCTION PANEL
AG RATIO: 2.5 (calc) (ref 1.0–2.5)
ALBUMIN MSPROF: 4.2 g/dL (ref 3.6–5.1)
ALT: 17 U/L (ref 6–29)
AST: 22 U/L (ref 10–35)
Alkaline phosphatase (APISO): 54 U/L (ref 33–130)
BILIRUBIN INDIRECT: 0.2 mg/dL (ref 0.2–1.2)
Bilirubin, Direct: 0.1 mg/dL (ref 0.0–0.2)
GLOBULIN: 1.7 g/dL — AB (ref 1.9–3.7)
TOTAL PROTEIN: 5.9 g/dL — AB (ref 6.1–8.1)
Total Bilirubin: 0.3 mg/dL (ref 0.2–1.2)

## 2018-01-13 LAB — CBC WITH DIFFERENTIAL/PLATELET
BASOS ABS: 47 {cells}/uL (ref 0–200)
BASOS PCT: 0.6 %
Eosinophils Absolute: 119 cells/uL (ref 15–500)
Eosinophils Relative: 1.5 %
HCT: 39.5 % (ref 35.0–45.0)
HEMOGLOBIN: 13.6 g/dL (ref 11.7–15.5)
LYMPHS ABS: 1849 {cells}/uL (ref 850–3900)
MCH: 31.4 pg (ref 27.0–33.0)
MCHC: 34.4 g/dL (ref 32.0–36.0)
MCV: 91.2 fL (ref 80.0–100.0)
MONOS PCT: 6.5 %
MPV: 11 fL (ref 7.5–12.5)
Neutro Abs: 5372 cells/uL (ref 1500–7800)
Neutrophils Relative %: 68 %
Platelets: 228 10*3/uL (ref 140–400)
RBC: 4.33 10*6/uL (ref 3.80–5.10)
RDW: 13 % (ref 11.0–15.0)
Total Lymphocyte: 23.4 %
WBC mixed population: 514 cells/uL (ref 200–950)
WBC: 7.9 10*3/uL (ref 3.8–10.8)

## 2018-01-13 LAB — URINALYSIS, ROUTINE W REFLEX MICROSCOPIC
BACTERIA UA: NONE SEEN /HPF
Bilirubin Urine: NEGATIVE
Glucose, UA: NEGATIVE
HYALINE CAST: NONE SEEN /LPF
Hgb urine dipstick: NEGATIVE
Ketones, ur: NEGATIVE
Nitrite: NEGATIVE
PROTEIN: NEGATIVE
RBC / HPF: NONE SEEN /HPF (ref 0–2)
SPECIFIC GRAVITY, URINE: 1.007 (ref 1.001–1.03)
Squamous Epithelial / LPF: NONE SEEN /HPF (ref <=5)
WBC, UA: NONE SEEN /HPF (ref 0–5)
pH: 7.5 (ref 5.0–8.0)

## 2018-01-13 LAB — BASIC METABOLIC PANEL WITH GFR
BUN: 18 mg/dL (ref 7–25)
CALCIUM: 9.7 mg/dL (ref 8.6–10.4)
CO2: 29 mmol/L (ref 20–32)
CREATININE: 0.86 mg/dL (ref 0.60–0.93)
Chloride: 103 mmol/L (ref 98–110)
GFR, Est African American: 76 mL/min/{1.73_m2} (ref >=60)
GFR, Est Non African American: 66 mL/min/{1.73_m2} (ref >=60)
GLUCOSE: 95 mg/dL (ref 65–99)
Potassium: 4.1 mmol/L (ref 3.5–5.3)
Sodium: 139 mmol/L (ref 135–146)

## 2018-01-13 LAB — LIPID PANEL
CHOL/HDL RATIO: 3.2 (calc) (ref <5.0)
CHOLESTEROL: 182 mg/dL (ref <200)
HDL: 57 mg/dL (ref >50)
LDL CHOLESTEROL (CALC): 107 mg/dL — AB
Non-HDL Cholesterol (Calc): 125 mg/dL (calc) (ref <130)
TRIGLYCERIDES: 88 mg/dL (ref <150)

## 2018-01-13 LAB — IRON, TOTAL/TOTAL IRON BINDING CAP
%SAT: 26 % (calc) (ref 11–50)
IRON: 84 ug/dL (ref 45–160)
TIBC: 319 ug/dL (ref 250–450)

## 2018-01-13 LAB — FOLATE RBC: RBC Folate: 953 ng/mL RBC (ref >280)

## 2018-01-13 LAB — MICROALBUMIN / CREATININE URINE RATIO
CREATININE, URINE: 29 mg/dL (ref 20–275)
MICROALB UR: 0.3 mg/dL
Microalb Creat Ratio: 10 mcg/mg creat (ref <30)

## 2018-01-13 LAB — MAGNESIUM, RBC: Magnesium RBC: 6.1 mg/dL (ref 4.0–6.4)

## 2018-01-13 LAB — METHYLMALONIC ACID, SERUM: METHYLMALONIC ACID, QUANT: 249 nmol/L (ref 87–318)

## 2018-01-13 LAB — TSH: TSH: 1.31 mIU/L (ref 0.40–4.50)

## 2018-01-29 ENCOUNTER — Encounter: Payer: Self-pay | Admitting: Physician Assistant

## 2018-01-29 DIAGNOSIS — H6593 Unspecified nonsuppurative otitis media, bilateral: Secondary | ICD-10-CM

## 2018-02-13 DIAGNOSIS — M79605 Pain in left leg: Secondary | ICD-10-CM | POA: Diagnosis not present

## 2018-02-13 DIAGNOSIS — M79604 Pain in right leg: Secondary | ICD-10-CM | POA: Diagnosis not present

## 2018-02-18 DIAGNOSIS — H903 Sensorineural hearing loss, bilateral: Secondary | ICD-10-CM | POA: Diagnosis not present

## 2018-02-18 DIAGNOSIS — H6593 Unspecified nonsuppurative otitis media, bilateral: Secondary | ICD-10-CM | POA: Diagnosis not present

## 2018-02-18 DIAGNOSIS — H9190 Unspecified hearing loss, unspecified ear: Secondary | ICD-10-CM | POA: Diagnosis not present

## 2018-03-11 ENCOUNTER — Other Ambulatory Visit: Payer: Self-pay | Admitting: Internal Medicine

## 2018-03-19 ENCOUNTER — Ambulatory Visit (INDEPENDENT_AMBULATORY_CARE_PROVIDER_SITE_OTHER): Payer: Medicare Other | Admitting: Internal Medicine

## 2018-03-19 ENCOUNTER — Encounter: Payer: Self-pay | Admitting: Internal Medicine

## 2018-03-19 VITALS — BP 124/80 | HR 80 | Temp 97.9°F | Ht 65.0 in | Wt 108.0 lb

## 2018-03-19 DIAGNOSIS — Z79899 Other long term (current) drug therapy: Secondary | ICD-10-CM

## 2018-03-19 DIAGNOSIS — I10 Essential (primary) hypertension: Secondary | ICD-10-CM

## 2018-03-19 DIAGNOSIS — E782 Mixed hyperlipidemia: Secondary | ICD-10-CM | POA: Diagnosis not present

## 2018-03-19 DIAGNOSIS — E039 Hypothyroidism, unspecified: Secondary | ICD-10-CM | POA: Diagnosis not present

## 2018-03-19 DIAGNOSIS — M791 Myalgia, unspecified site: Secondary | ICD-10-CM | POA: Diagnosis not present

## 2018-03-19 DIAGNOSIS — E559 Vitamin D deficiency, unspecified: Secondary | ICD-10-CM | POA: Diagnosis not present

## 2018-03-19 DIAGNOSIS — R7309 Other abnormal glucose: Secondary | ICD-10-CM

## 2018-03-19 DIAGNOSIS — R5383 Other fatigue: Secondary | ICD-10-CM | POA: Diagnosis not present

## 2018-03-19 LAB — CBC WITH DIFFERENTIAL/PLATELET
BASOS ABS: 39 {cells}/uL (ref 0–200)
Basophils Relative: 0.5 %
EOS ABS: 70 {cells}/uL (ref 15–500)
Eosinophils Relative: 0.9 %
HCT: 40.9 % (ref 35.0–45.0)
HEMOGLOBIN: 14 g/dL (ref 11.7–15.5)
Lymphs Abs: 1342 cells/uL (ref 850–3900)
MCH: 31.5 pg (ref 27.0–33.0)
MCHC: 34.2 g/dL (ref 32.0–36.0)
MCV: 91.9 fL (ref 80.0–100.0)
MONOS PCT: 6.9 %
MPV: 11.3 fL (ref 7.5–12.5)
Neutro Abs: 5811 cells/uL (ref 1500–7800)
Neutrophils Relative %: 74.5 %
Platelets: 199 10*3/uL (ref 140–400)
RBC: 4.45 10*6/uL (ref 3.80–5.10)
RDW: 13.4 % (ref 11.0–15.0)
Total Lymphocyte: 17.2 %
WBC mixed population: 538 cells/uL (ref 200–950)
WBC: 7.8 10*3/uL (ref 3.8–10.8)

## 2018-03-19 LAB — SEDIMENTATION RATE: Sed Rate: 2 mm/h (ref 0–30)

## 2018-03-19 LAB — TSH: TSH: 1.52 m[IU]/L (ref 0.40–4.50)

## 2018-03-19 NOTE — Progress Notes (Signed)
Subjective:    Patient ID: Debbie Houston, female    DOB: 05-05-1941, 77 y.o.   MRN: [id number]  HPI  This delightful 77 yo MWF with HTN, HLD, Hypothyroidism and PreDM who presents with c/o episodic periods of profound fatigue or weakness, occasional myalgias, arthralgias, ? diaphoresis and denies any respiratory, GI or GU sx's. She has had occasional upper back pains. She also has concerns re: a sister now 7 weeks in the hospital with autoimmune vasculitis & multi systems failure  Medication Sig  . acidophilus (RISAQUAD) CAPS capsule Take 1 cap daily.  Marland Kitchen aspirin EC 81 MG tablet Take  at bedtime.  . Biotin 1 MG CAPS Take 1 mg by mouth daily.  . bisoprolol-hctz2.5-6.25  TAKE 1 TAB EVERY DAY   . ENTOCORT EC 3 MG  Take 3 mg by mouth daily.  . Calcium Carbonate-Vitamin D  Take 1 tablet by mouth daily.  Marland Kitchen VITAMIN D 2000 UNITS Take 2,000 Units daily.   . Cinnamon 500 MG TABS Take 500 mg by mouth daily.  . famotidine 40 MG tablet TAKE 1 TAB EVERY DAY  . FLAX SEED OIL 1000 MG Take 1,000 mg daily.   Marland Kitchen levothyroxine 125 MCG tablet 1 Tab MWF & 1/2 tab TThSS  . loratadine 10 MG tablet Take 10 mg by mouth daily.  Marland Kitchen MAGNESIUM PO Take 50 mg by mouth.  . Multiple Vitamins-Minerals Take 1 tablet by mouth daily.  Marland Kitchen nystatin ointment  Apply  2  times daily.  Marland Kitchen LOVAZA 1 g capsule Take 1 g  daily.  . pantoprazole  40 MG tablet Take 1 tab  daily.  . simvastatin  20 MG tablet TAKE 1 TAB DAILY  . KENALOG 0.1 % topically 2 (two) times daily.  . vitamin C  500 MG tablet Take 500 mg by mouth daily.  . cetirizine  10 MG tablet Take 10 mg by mouth daily.    Allergies  Allergen Reactions  . Fosamax [Alendronate Sodium] Other (See Comments)    Reaction:  GI upset   . Singulair [Montelukast Sodium] Other (See Comments)    Reaction:  Makes pt jittery  . Wellbutrin [Bupropion] Hives  . Sulfa Antibiotics Rash   Past Medical History:  Diagnosis Date  . Arthritis   . Benign labile hypertension   . Colitis   .  DJD (degenerative joint disease)   . Endometrial polyp   . GERD (gastroesophageal reflux disease)   . History of basal cell carcinoma excision   . History of esophagitis   . Hyperlipidemia   . Hypothyroidism   . Osteopenia   . Prediabetes   . Vitamin D deficiency   . Wears glasses        Medication Sig  . acidophilus (RISAQUAD) CAPS capsule Take 1 capsule by mouth daily.  Marland Kitchen aspirin EC 81 MG tablet Take 81 mg by mouth at bedtime.  . Biotin 1 MG CAPS Take 1 mg by mouth daily.  . bisoprolol-hydrochlorothiazide (ZIAC) 2.5-6.25 MG tablet TAKE 1 TABLET BY MOUTH EVERY DAY  . budesonide (ENTOCORT EC) 3 MG 24 hr capsule Take 3 mg by mouth daily.  . Calcium Carbonate-Vitamin D (CALCIUM 600+D HIGH POTENCY) 600-400 MG-UNIT tablet Take 1 tablet by mouth daily.  . cetirizine (ZYRTEC) 10 MG tablet Take 10 mg by mouth daily.   . Cholecalciferol (VITAMIN D3) 2000 UNITS TABS Take 2,000 Units by mouth daily.   . Cinnamon 500 MG TABS Take 500 mg by mouth daily.  . famotidine (  PEPCID) 40 MG tablet TAKE 1 TABLET(40 MG) BY MOUTH EVERY EVENING  . Flaxseed, Linseed, (FLAX SEED OIL) 1000 MG CAPS Take 1,000 mg by mouth daily.   Marland Kitchen levothyroxine (SYNTHROID, LEVOTHROID) 125 MCG tablet Take 62.5-125 mcg by mouth daily before breakfast. Pt takes one tablet Monday, Wednesday, and Friday.   Pt takes one-half tablet Tuesday, Thursday, Saturday, and Sunday.  . levothyroxine (SYNTHROID, LEVOTHROID) 125 MCG tablet TAKE 1 TABLET BY MOUTH DAILY BEFORE BREAKFAST  . MAGNESIUM PO Take 50 mg by mouth.  . Multiple Vitamins-Minerals (MULTIVITAMIN WITH MINERALS) tablet Take 1 tablet by mouth daily.  Marland Kitchen nystatin ointment (MYCOSTATIN) Apply 1 application topically 2 (two) times daily.  Marland Kitchen omega-3 acid ethyl esters (LOVAZA) 1 g capsule Take 1 g by mouth daily.  . pantoprazole (PROTONIX) 40 MG tablet Take 1 tablet (40 mg total) by mouth daily.  . simvastatin (ZOCOR) 20 MG tablet TAKE 1 TABLET BY MOUTH DAILY (Patient taking  differently: Take 1 tablet by mouth at bedtime)  . triamcinolone ointment (KENALOG) 0.1 % Apply 1 application topically 2 (two) times daily.  . vitamin C (ASCORBIC ACID) 500 MG tablet Take 500 mg by mouth daily.   Allergies  Allergen Reactions  . Fosamax [Alendronate Sodium] Other (See Comments)    Reaction:  GI upset   . Singulair [Montelukast Sodium] Other (See Comments)    Reaction:  Makes pt jittery  . Wellbutrin [Bupropion] Hives  . Sulfa Antibiotics Rash   Past Medical History:  Diagnosis Date  . Arthritis   . Benign labile hypertension   . Colitis   . DJD (degenerative joint disease)   . Endometrial polyp   . GERD (gastroesophageal reflux disease)   . History of basal cell carcinoma excision   . History of esophagitis   . Hyperlipidemia   . Hypothyroidism   . Osteopenia   . Prediabetes   . Vitamin D deficiency   . Wears glasses    Past Surgical History:  Procedure Laterality Date  . DILATATION & CURETTAGE/HYSTEROSCOPY WITH MYOSURE N/A 10/27/2014   Procedure: DILATATION & CURETTAGE/HYSTEROSCOPY WITH MYOSURE;  Surgeon: Darlyn Chamber, MD;  Location: [facility];  Service: Gynecology;  Laterality: N/A;  . DILATION AND CURETTAGE OF UTERUS    . MOHS SURGERY  2005   LEFT NASAL BRIDGE FOR BASAL CELL  . TONSILLECTOMY  age 58   Review of Systems  10 point systems review negative except as above.    Objective:   Physical Exam  BP 124/80   Pulse 80   Temp 97.9 F (36.6 C)   Ht 5' 5"  (1.651 m)   Wt 108 lb (49 kg)   SpO2 94%   BMI 17.97 kg/m   HEENT - WNL. Neck - supple.  Chest - Clear equal BS. Cor - Nl HS. RRR w/o sig MGR. PP 1(+). No edema. MS- FROM w/o deformities.  Gait Nl. Neuro -  Nl w/o focal abnormalities.    Assessment & Plan:   1. Essential hypertension  - CBC with Differential/Platelet - COMPLETE METABOLIC PANEL WITH GFR - Magnesium - TSH  2. Mixed hyperlipidemia   3. Abnormal glucose  - Hemoglobin A1c  4. Vitamin D  deficiency  - VITAMIN D 25 Hydroxyl  5. Hypothyroidism  - TSH  6. Fatigue, unspecified type - Sedimentation rate  7. Myalgia  - CK - Aldolase  8. Medication management  - CBC with Differential/Platelet - COMPLETE METABOLIC PANEL WITH GFR - Magnesium - TSH - VITAMIN D  25 Hydroxyl  - discussed consider sx's possibly due to Simvastatin if w/u neg otherwise and cons

## 2018-03-20 LAB — CK: CK TOTAL: 90 U/L (ref 29–143)

## 2018-03-20 LAB — COMPLETE METABOLIC PANEL WITH GFR
AG Ratio: 2.3 (calc) (ref 1.0–2.5)
ALKALINE PHOSPHATASE (APISO): 58 U/L (ref 33–130)
ALT: 17 U/L (ref 6–29)
AST: 23 U/L (ref 10–35)
Albumin: 4.4 g/dL (ref 3.6–5.1)
BUN: 10 mg/dL (ref 7–25)
CALCIUM: 9.8 mg/dL (ref 8.6–10.4)
CO2: 29 mmol/L (ref 20–32)
CREATININE: 0.88 mg/dL (ref 0.60–0.93)
Chloride: 102 mmol/L (ref 98–110)
GFR, EST NON AFRICAN AMERICAN: 64 mL/min/{1.73_m2} (ref >=60)
GFR, Est African American: 74 mL/min/{1.73_m2} (ref >=60)
Globulin: 1.9 g/dL (calc) (ref 1.9–3.7)
Glucose, Bld: 80 mg/dL (ref 65–99)
POTASSIUM: 4.5 mmol/L (ref 3.5–5.3)
Sodium: 138 mmol/L (ref 135–146)
Total Bilirubin: 0.4 mg/dL (ref 0.2–1.2)
Total Protein: 6.3 g/dL (ref 6.1–8.1)

## 2018-03-20 LAB — HEMOGLOBIN A1C
Hgb A1c MFr Bld: 5.7 % of total Hgb — ABNORMAL HIGH (ref <5.7)
Mean Plasma Glucose: 117 (calc)
eAG (mmol/L): 6.5 (calc)

## 2018-03-20 LAB — VITAMIN D 25 HYDROXY (VIT D DEFICIENCY, FRACTURES): VIT D 25 HYDROXY: 127 ng/mL — AB (ref 30–100)

## 2018-03-20 LAB — MAGNESIUM: MAGNESIUM: 1.9 mg/dL (ref 1.5–2.5)

## 2018-03-21 LAB — ALDOLASE: Aldolase: 3.6 U/L (ref <=8.1)

## 2018-03-22 ENCOUNTER — Encounter: Payer: Self-pay | Admitting: Internal Medicine

## 2018-03-26 ENCOUNTER — Other Ambulatory Visit: Payer: Self-pay | Admitting: Internal Medicine

## 2018-04-10 ENCOUNTER — Other Ambulatory Visit: Payer: Self-pay | Admitting: Physician Assistant

## 2018-04-10 ENCOUNTER — Other Ambulatory Visit: Payer: Self-pay | Admitting: Internal Medicine

## 2018-04-10 DIAGNOSIS — K2100 Gastro-esophageal reflux disease with esophagitis, without bleeding: Secondary | ICD-10-CM

## 2018-04-17 NOTE — Progress Notes (Signed)
Assessment and Plan:  Chronic midline low back pain with right-sided sciatica Will proceed with MRI, will provide prednisone and gabapentin for comfort as she is traveling over the next 1-2 weeks, will have her follow up with ortho vs referral to neurology pending results Go to the ER if you have any new weakness in your legs, have trouble controlling your urine or bowels, or have worsening pain. -     predniSONE (DELTASONE) 20 MG tablet; 2 tablets daily for 3 days, 1 tablet daily for 4 days. -     gabapentin (NEURONTIN) 100 MG capsule; 1-3 at night for pain -     MR Lumbar Spine Wo Contrast; Future  Numbness and tingling of both lower extremities Will proceed with MRI; consider neurology referral for nerve studies  -     MR Lumbar Spine Wo Contrast; Future  Further disposition pending results of labs. Discussed med's effects and SE's.   Over 15 minutes of exam, counseling, chart review, and critical decision making was performed.   Future Appointments  Date Time Provider [place]  07/24/2018  9:30 AM Unk Pinto, MD [facility] None  01/12/2019  2:00 PM Vicie Mutters, PA-C [facility] None    ------------------------------------------------------------------------------------------------------------------   HPI BP 110/74   Pulse 74   Temp (!) 97.5 F (36.4 C)   Ht 5' 5"  (1.651 m)   Wt 107 lb 3.2 oz (48.6 kg)   SpO2 97%   BMI 17.84 kg/m   77 y.o.female with hx of lumbar DJD and osteopenia (DEXA 11/05/2016 L hip T -2.2) presents for evaluation of lower back pain with intermittent bilateral lower extremity numbness and right foot weakness ongoing for over 6 months. She reports lumbar pain as midline and aching. She also endorses shooting sciatica pain of R, as well and dull numb sensation of right lateral thigh, right anterior lower leg, right lateral dorsum of foot and toes, and left anterior lower leg and medial dorsum of foot. She also endorses intermittent sense of R  foot plantar flexion. She has intermittent hx of lower back pain with noted lumbar pain with sciatica reported Jan/2018. Previous to this she has discussed lumbar pain with Dr. Jiles Prows at [facility], but has not followed up in over a year.  11/2016 lumbar xray demonstrated: Degenerative facet disease throughout the lumbar spine. Slight anterolisthesis of L4 on L5. Disc spaces are maintained. No fracture. Diffuse osteopenia  She was instructed to follow up with Dr. Jiles Prows at [facility] at that time but never did this.   She has not tried any interventions for this. Reports pain/dull numbness is uncomfortable at night and wakes her up occasionally.   Denies headache, dizziness, weight loss, fever/chills, night sweats, rashes, loss of bladder or bowel control.   Past Medical History:  Diagnosis Date  . Arthritis   . Benign labile hypertension   . Colitis   . DJD (degenerative joint disease)   . Endometrial polyp   . GERD (gastroesophageal reflux disease)   . History of basal cell carcinoma excision   . History of esophagitis   . Hyperlipidemia   . Hypothyroidism   . Osteopenia   . Prediabetes   . Vitamin D deficiency   . Wears glasses      Allergies  Allergen Reactions  . Fosamax [Alendronate Sodium] Other (See Comments)    Reaction:  GI upset   . Singulair [Montelukast Sodium] Other (See Comments)    Reaction:  Makes pt jittery  . Wellbutrin [Bupropion] Hives  .  Sulfa Antibiotics Rash    Current Outpatient Medications on File Prior to Visit  Medication Sig  . acidophilus (RISAQUAD) CAPS capsule Take 1 capsule by mouth daily.  Marland Kitchen aspirin EC 81 MG tablet Take 81 mg by mouth at bedtime.  . Biotin 1 MG CAPS Take 1 mg by mouth daily.  . bisoprolol-hydrochlorothiazide (ZIAC) 2.5-6.25 MG tablet TAKE 1 TABLET BY MOUTH EVERY DAY (Patient taking differently: TAKE 1 TABLET BY MOUTH EVERY OTHER  DAY)  . budesonide (ENTOCORT EC) 3 MG 24 hr capsule Take 3 mg by mouth daily.  .  Calcium Carbonate-Vitamin D (CALCIUM 600+D HIGH POTENCY) 600-400 MG-UNIT tablet Take 1 tablet by mouth daily.  . Cholecalciferol (VITAMIN D3) 2000 UNITS TABS Take 2,000 Units by mouth daily.   . Cinnamon 500 MG TABS Take 500 mg by mouth daily.  . famotidine (PEPCID) 40 MG tablet TAKE 1 TABLET(40 MG) BY MOUTH EVERY EVENING  . Flaxseed, Linseed, (FLAX SEED OIL) 1000 MG CAPS Take 1,000 mg by mouth daily.   Marland Kitchen levothyroxine (SYNTHROID, LEVOTHROID) 125 MCG tablet Take 62.5-125 mcg by mouth daily before breakfast. Pt takes one tablet Monday, Wednesday, and Friday.   Pt takes one-half tablet Tuesday, Thursday, Saturday, and Sunday.  . levothyroxine (SYNTHROID, LEVOTHROID) 125 MCG tablet TAKE 1 TABLET BY MOUTH DAILY BEFORE BREAKFAST  . loratadine (CLARITIN) 10 MG tablet Take 10 mg by mouth daily.  Marland Kitchen MAGNESIUM PO Take 50 mg by mouth.  . Multiple Vitamins-Minerals (MULTIVITAMIN WITH MINERALS) tablet Take 1 tablet by mouth daily.  Marland Kitchen nystatin ointment (MYCOSTATIN) Apply 1 application topically 2 (two) times daily.  Marland Kitchen omega-3 acid ethyl esters (LOVAZA) 1 g capsule Take 1 g by mouth daily.  . pantoprazole (PROTONIX) 40 MG tablet TAKE 1 TABLET(40 MG) BY MOUTH DAILY  . simvastatin (ZOCOR) 20 MG tablet TAKE 1 TABLET BY MOUTH DAILY  . triamcinolone ointment (KENALOG) 0.1 % Apply 1 application topically 2 (two) times daily.  . vitamin C (ASCORBIC ACID) 500 MG tablet Take 500 mg by mouth daily.  . cetirizine (ZYRTEC) 10 MG tablet Take 10 mg by mouth daily.    No current facility-administered medications on file prior to visit.     ROS: all negative except above.   Physical Exam:  BP 110/74   Pulse 74   Temp (!) 97.5 F (36.4 C)   Ht 5' 5"  (1.651 m)   Wt 107 lb 3.2 oz (48.6 kg)   SpO2 97%   BMI 17.84 kg/m   General Appearance: Well nourished, in no apparent distress. Eyes: PERRLA, EOMs, conjunctiva no swelling or erythema Sinuses: No Frontal/maxillary tenderness ENT/Mouth: Ext aud canals clear, TMs  without erythema, bulging. No erythema, swelling, or exudate on post pharynx.  Tonsils not swollen or erythematous. Hearing normal.  Neck: Supple, thyroid normal.  Respiratory: Respiratory effort normal, BS equal bilaterally without rales, rhonchi, wheezing or stridor.  Cardio: RRR with no MRGs. Brisk peripheral pulses without edema.  Abdomen: Soft, + BS.  Non tender, no guarding, rebound, hernias, masses. Lymphatics: Non tender without lymphadenopathy.  Musculoskeletal: Full ROM, 5/5 strength throughout bilateral lower extremities, normal gait.  Skin: Warm, dry without rashes, lesions, ecchymosis.  Neuro: Cranial nerves intact. Normal muscle tone, no cerebellar symptoms. dull numb sensation of right lateral thigh, right anterior lower leg, right lateral dorsum of foot and toes, and left anterior lower leg and medial dorsum of foot.  Psych: Awake and oriented X 3, normal affect, Insight and Judgment appropriate.     Gorden Harms  Wilma Flavin, NP 11:03 AM [facility]

## 2018-04-18 ENCOUNTER — Ambulatory Visit (INDEPENDENT_AMBULATORY_CARE_PROVIDER_SITE_OTHER): Payer: Medicare Other | Admitting: Adult Health

## 2018-04-18 ENCOUNTER — Encounter: Payer: Self-pay | Admitting: Adult Health

## 2018-04-18 VITALS — BP 110/74 | HR 74 | Temp 97.5°F | Ht 65.0 in | Wt 107.2 lb

## 2018-04-18 DIAGNOSIS — M5441 Lumbago with sciatica, right side: Secondary | ICD-10-CM

## 2018-04-18 DIAGNOSIS — G8929 Other chronic pain: Secondary | ICD-10-CM | POA: Diagnosis not present

## 2018-04-18 DIAGNOSIS — R200 Anesthesia of skin: Secondary | ICD-10-CM | POA: Diagnosis not present

## 2018-04-18 DIAGNOSIS — R202 Paresthesia of skin: Secondary | ICD-10-CM | POA: Diagnosis not present

## 2018-04-18 MED ORDER — PREDNISONE 20 MG PO TABS: ORAL_TABLET | ORAL | 0 refills | Status: DC

## 2018-04-18 MED ORDER — GABAPENTIN 100 MG PO CAPS: ORAL_CAPSULE | ORAL | 1 refills | Status: DC

## 2018-04-18 NOTE — Patient Instructions (Signed)
Peripheral Neuropathy Peripheral neuropathy is a type of nerve damage. It affects nerves that carry signals between the spinal cord and other parts of the body. These are called peripheral nerves. With peripheral neuropathy, one nerve or a group of nerves may be damaged. What are the causes? Many things can damage peripheral nerves. For some people with peripheral neuropathy, the cause is unknown. Some causes include:  Diabetes. This is the most common cause of peripheral neuropathy.  Injury to a nerve.  Pressure or stress on a nerve that lasts a long time.  Too little vitamin B. Alcoholism can lead to this.  Infections.  Autoimmune diseases, such as multiple sclerosis and systemic lupus erythematosus.  Inherited nerve diseases.  Some medicines, such as cancer drugs.  Toxic substances, such as lead and mercury.  Too little blood flowing to the legs.  Kidney disease.  Thyroid disease.  What are the signs or symptoms? Different people have different symptoms. The symptoms you have will depend on which of your nerves is damaged. Common symptoms include:  Loss of feeling (numbness) in the feet and hands.  Tingling in the feet and hands.  Pain that burns.  Very sensitive skin.  Weakness.  Not being able to move a part of the body (paralysis).  Muscle twitching.  Clumsiness or poor coordination.  Loss of balance.  Not being able to control your bladder.  Feeling dizzy.  Sexual problems.  How is this diagnosed? Peripheral neuropathy is a symptom, not a disease. Finding the cause of peripheral neuropathy can be hard. To figure that out, your health care provider will take a medical history and do a physical exam. A neurological exam will also be done. This involves checking things affected by your brain, spinal cord, and nerves (nervous system). For example, your health care provider will check your reflexes, how you move, and what you can feel. Other types of tests  may also be ordered, such as:  Blood tests.  A test of the fluid in your spinal cord.  Imaging tests, such as CT scans or an MRI.  Electromyography (EMG). This test checks the nerves that control muscles.  Nerve conduction velocity tests. These tests check how fast messages pass through your nerves.  Nerve biopsy. A small piece of nerve is removed. It is then checked under a microscope.  How is this treated?  Medicine is often used to treat peripheral neuropathy. Medicines may include: ? Pain-relieving medicines. Prescription or over-the-counter medicine may be suggested. ? Antiseizure medicine. This may be used for pain. ? Antidepressants. These also may help ease pain from neuropathy. ? Lidocaine. This is a numbing medicine. You might wear a patch or be given a shot. ? Mexiletine. This medicine is typically used to help control irregular heart rhythms.  Surgery. Surgery may be needed to relieve pressure on a nerve or to destroy a nerve that is causing pain.  Physical therapy to help movement.  Assistive devices to help movement. Follow these instructions at home:  Only take over-the-counter or prescription medicines as directed by your health care provider. Follow the instructions carefully for any given medicines. Do not take any other medicines without first getting approval from your health care provider.  If you have diabetes, work closely with your health care provider to keep your blood sugar under control.  If you have numbness in your feet: ? Check every day for signs of injury or infection. Watch for redness, warmth, and swelling. ? Wear padded socks and comfortable  shoes. These help protect your feet.  Do not do things that put pressure on your damaged nerve.  Do not smoke. Smoking keeps blood from getting to damaged nerves.  Avoid or limit alcohol. Too much alcohol can cause a lack of B vitamins. These vitamins are needed for healthy nerves.  Develop a good  support system. Coping with peripheral neuropathy can be stressful. Talk to a mental health specialist or join a support group if you are struggling.  Follow up with your health care provider as directed. Contact a health care provider if:  You have new signs or symptoms of peripheral neuropathy.  You are struggling emotionally from dealing with peripheral neuropathy.  You have a fever. Get help right away if:  You have an injury or infection that is not healing.  You feel very dizzy or begin vomiting.  You have chest pain.  You have trouble breathing. This information is not intended to replace advice given to you by your health care provider. Make sure you discuss any questions you have with your health care provider. Document Released: 10/26/2002 Document Revised: 04/12/2016 Document Reviewed: 07/13/2013 Elsevier Interactive Patient Education  2017 [place].   Gabapentin capsules or tablets What is this medicine? GABAPENTIN (GA ba pen tin) is used to control partial seizures in adults with epilepsy. It is also used to treat certain types of nerve pain. This medicine may be used for other purposes; ask your health care provider or pharmacist if you have questions. COMMON BRAND NAME(S): Active-PAC with Gabapentin, Gabarone, Neurontin What should I tell my health care provider before I take this medicine? They need to know if you have any of these conditions: -kidney disease -suicidal thoughts, plans, or attempt; a previous suicide attempt by you or a family member -an unusual or allergic reaction to gabapentin, other medicines, foods, dyes, or preservatives -pregnant or trying to get pregnant -breast-feeding How should I use this medicine? Take this medicine by mouth with a glass of water. Follow the directions on the prescription label. You can take it with or without food. If it upsets your stomach, take it with food.Take your medicine at regular intervals. Do not take it  more often than directed. Do not stop taking except on your doctor's advice. If you are directed to break the 600 or 800 mg tablets in half as part of your dose, the extra half tablet should be used for the next dose. If you have not used the extra half tablet within 28 days, it should be thrown away. A special MedGuide will be given to you by the pharmacist with each prescription and refill. Be sure to read this information carefully each time. Talk to your pediatrician regarding the use of this medicine in children. Special care may be needed. Overdosage: If you think you have taken too much of this medicine contact a poison control center or emergency room at once. NOTE: This medicine is only for you. Do not share this medicine with others. What if I miss a dose? If you miss a dose, take it as soon as you can. If it is almost time for your next dose, take only that dose. Do not take double or extra doses. What may interact with this medicine? Do not take this medicine with any of the following medications: -other gabapentin products This medicine may also interact with the following medications: -alcohol -antacids -antihistamines for allergy, cough and cold -certain medicines for anxiety or sleep -certain medicines for depression or  psychotic disturbances -homatropine; hydrocodone -naproxen -narcotic medicines (opiates) for pain -phenothiazines like chlorpromazine, mesoridazine, prochlorperazine, thioridazine This list may not describe all possible interactions. Give your health care provider a list of all the medicines, herbs, non-prescription drugs, or dietary supplements you use. Also tell them if you smoke, drink alcohol, or use illegal drugs. Some items may interact with your medicine. What should I watch for while using this medicine? Visit your doctor or health care professional for regular checks on your progress. You may want to keep a record at home of how you feel your condition  is responding to treatment. You may want to share this information with your doctor or health care professional at each visit. You should contact your doctor or health care professional if your seizures get worse or if you have any new types of seizures. Do not stop taking this medicine or any of your seizure medicines unless instructed by your doctor or health care professional. Stopping your medicine suddenly can increase your seizures or their severity. Wear a medical identification bracelet or chain if you are taking this medicine for seizures, and carry a card that lists all your medications. You may get drowsy, dizzy, or have blurred vision. Do not drive, use machinery, or do anything that needs mental alertness until you know how this medicine affects you. To reduce dizzy or fainting spells, do not sit or stand up quickly, especially if you are an older patient. Alcohol can increase drowsiness and dizziness. Avoid alcoholic drinks. Your mouth may get dry. Chewing sugarless gum or sucking hard candy, and drinking plenty of water will help. The use of this medicine may increase the chance of suicidal thoughts or actions. Pay special attention to how you are responding while on this medicine. Any worsening of mood, or thoughts of suicide or dying should be reported to your health care professional right away. Women who become pregnant while using this medicine may enroll in the [place] Pregnancy Registry by calling [phone]. This registry collects information about the safety of antiepileptic drug use during pregnancy. What side effects may I notice from receiving this medicine? Side effects that you should report to your doctor or health care professional as soon as possible: -allergic reactions like skin rash, itching or hives, swelling of the face, lips, or tongue -worsening of mood, thoughts or actions of suicide or dying Side effects that usually do not require  medical attention (report to your doctor or health care professional if they continue or are bothersome): -constipation -difficulty walking or controlling muscle movements -dizziness -nausea -slurred speech -tiredness -tremors -weight gain This list may not describe all possible side effects. Call your doctor for medical advice about side effects. You may report side effects to FDA at 1-800-FDA-1088. Where should I keep my medicine? Keep out of reach of children. This medicine may cause accidental overdose and death if it taken by other adults, children, or pets. Mix any unused medicine with a substance like cat litter or coffee grounds. Then throw the medicine away in a sealed container like a sealed bag or a coffee can with a lid. Do not use the medicine after the expiration date. Store at room temperature between 15 and 30 degrees C (59 and 86 degrees F). NOTE: This sheet is a summary. It may not cover all possible information. If you have questions about this medicine, talk to your doctor, pharmacist, or health care provider.  2018 Elsevier/Gold Standard (2014-01-01 15:26:50)

## 2018-04-22 DIAGNOSIS — H918X2 Other specified hearing loss, left ear: Secondary | ICD-10-CM | POA: Diagnosis not present

## 2018-04-22 DIAGNOSIS — H903 Sensorineural hearing loss, bilateral: Secondary | ICD-10-CM | POA: Diagnosis not present

## 2018-05-06 ENCOUNTER — Ambulatory Visit
Admission: RE | Admit: 2018-05-06 | Discharge: 2018-05-06 | Disposition: A | Discharge status: Home / Self Care | Payer: Medicare Other | Source: Ambulatory Visit | Attending: Adult Health | Admitting: Adult Health

## 2018-05-06 DIAGNOSIS — G8929 Other chronic pain: Secondary | ICD-10-CM

## 2018-05-06 DIAGNOSIS — M48061 Spinal stenosis, lumbar region without neurogenic claudication: Secondary | ICD-10-CM | POA: Diagnosis not present

## 2018-05-06 DIAGNOSIS — R202 Paresthesia of skin: Secondary | ICD-10-CM

## 2018-05-06 DIAGNOSIS — M5441 Lumbago with sciatica, right side: Principal | ICD-10-CM

## 2018-05-06 DIAGNOSIS — R200 Anesthesia of skin: Secondary | ICD-10-CM

## 2018-05-07 ENCOUNTER — Other Ambulatory Visit: Payer: Self-pay | Admitting: Adult Health

## 2018-05-07 DIAGNOSIS — M48062 Spinal stenosis, lumbar region with neurogenic claudication: Secondary | ICD-10-CM

## 2018-05-07 DIAGNOSIS — M48061 Spinal stenosis, lumbar region without neurogenic claudication: Secondary | ICD-10-CM

## 2018-05-12 ENCOUNTER — Encounter: Payer: Self-pay | Admitting: Adult Health

## 2018-05-13 ENCOUNTER — Telehealth: Payer: Self-pay | Admitting: *Deleted

## 2018-05-13 NOTE — Telephone Encounter (Signed)
Patient reported she has appointment with Dr Ellene Route on 06/26/2018.

## 2018-05-16 DIAGNOSIS — K2270 Barrett's esophagus without dysplasia: Secondary | ICD-10-CM | POA: Diagnosis not present

## 2018-05-16 DIAGNOSIS — A09 Infectious gastroenteritis and colitis, unspecified: Secondary | ICD-10-CM | POA: Diagnosis not present

## 2018-06-16 DIAGNOSIS — L720 Epidermal cyst: Secondary | ICD-10-CM | POA: Diagnosis not present

## 2018-06-16 DIAGNOSIS — D1801 Hemangioma of skin and subcutaneous tissue: Secondary | ICD-10-CM | POA: Diagnosis not present

## 2018-06-16 DIAGNOSIS — L821 Other seborrheic keratosis: Secondary | ICD-10-CM | POA: Diagnosis not present

## 2018-06-16 DIAGNOSIS — Z85828 Personal history of other malignant neoplasm of skin: Secondary | ICD-10-CM | POA: Diagnosis not present

## 2018-06-26 DIAGNOSIS — M48062 Spinal stenosis, lumbar region with neurogenic claudication: Secondary | ICD-10-CM | POA: Diagnosis not present

## 2018-06-26 DIAGNOSIS — R030 Elevated blood-pressure reading, without diagnosis of hypertension: Secondary | ICD-10-CM | POA: Diagnosis not present

## 2018-06-26 DIAGNOSIS — Z681 Body mass index (BMI) 19 or less, adult: Secondary | ICD-10-CM | POA: Diagnosis not present

## 2018-07-03 DIAGNOSIS — I83813 Varicose veins of bilateral lower extremities with pain: Secondary | ICD-10-CM | POA: Diagnosis not present

## 2018-07-11 DIAGNOSIS — M5416 Radiculopathy, lumbar region: Secondary | ICD-10-CM | POA: Diagnosis not present

## 2018-07-11 DIAGNOSIS — M5116 Intervertebral disc disorders with radiculopathy, lumbar region: Secondary | ICD-10-CM | POA: Diagnosis not present

## 2018-07-18 ENCOUNTER — Other Ambulatory Visit: Payer: Self-pay | Admitting: Internal Medicine

## 2018-07-18 DIAGNOSIS — H52223 Regular astigmatism, bilateral: Secondary | ICD-10-CM | POA: Diagnosis not present

## 2018-07-18 DIAGNOSIS — Z9849 Cataract extraction status, unspecified eye: Secondary | ICD-10-CM | POA: Diagnosis not present

## 2018-07-18 DIAGNOSIS — H524 Presbyopia: Secondary | ICD-10-CM | POA: Diagnosis not present

## 2018-07-18 DIAGNOSIS — H5203 Hypermetropia, bilateral: Secondary | ICD-10-CM | POA: Diagnosis not present

## 2018-07-18 DIAGNOSIS — H43393 Other vitreous opacities, bilateral: Secondary | ICD-10-CM | POA: Diagnosis not present

## 2018-07-18 DIAGNOSIS — H04123 Dry eye syndrome of bilateral lacrimal glands: Secondary | ICD-10-CM | POA: Diagnosis not present

## 2018-07-18 DIAGNOSIS — Z961 Presence of intraocular lens: Secondary | ICD-10-CM | POA: Diagnosis not present

## 2018-07-24 ENCOUNTER — Ambulatory Visit (INDEPENDENT_AMBULATORY_CARE_PROVIDER_SITE_OTHER): Payer: Medicare Other | Admitting: Internal Medicine

## 2018-07-24 ENCOUNTER — Encounter: Payer: Self-pay | Admitting: Internal Medicine

## 2018-07-24 VITALS — BP 112/68 | HR 76 | Temp 97.3°F | Resp 16 | Ht 65.0 in | Wt 107.0 lb

## 2018-07-24 DIAGNOSIS — E782 Mixed hyperlipidemia: Secondary | ICD-10-CM

## 2018-07-24 DIAGNOSIS — I10 Essential (primary) hypertension: Secondary | ICD-10-CM

## 2018-07-24 DIAGNOSIS — E039 Hypothyroidism, unspecified: Secondary | ICD-10-CM

## 2018-07-24 DIAGNOSIS — K2100 Gastro-esophageal reflux disease with esophagitis, without bleeding: Secondary | ICD-10-CM

## 2018-07-24 DIAGNOSIS — R7309 Other abnormal glucose: Secondary | ICD-10-CM | POA: Diagnosis not present

## 2018-07-24 DIAGNOSIS — E559 Vitamin D deficiency, unspecified: Secondary | ICD-10-CM

## 2018-07-24 DIAGNOSIS — Z79899 Other long term (current) drug therapy: Secondary | ICD-10-CM

## 2018-07-24 NOTE — Progress Notes (Signed)
This very nice 77 y.o. MWF presents for 3 month follow up with HTN, HLD, Pre-Diabetes, Hypothyroidism  and Vitamin D Deficiency.  Patient also has GERD cobntrolled on her meds.      Patient is treated for HTN (2015) & BP has been controlled at home. Today's BP is at goal -  112/68. Patient has had no complaints of any cardiac type chest pain, palpitations, dyspnea / orthopnea / PND, dizziness, claudication, or dependent edema.     Hyperlipidemia is controlled with diet & meds. Patient denies myalgias or other med SE's. Last Lipids were  Lab Results  Component Value Date   CHOL 182 01/09/2018   HDL 57 01/09/2018   LDLCALC 107 (H) 01/09/2018   TRIG 88 01/09/2018   CHOLHDL 3.2 01/09/2018      Patient was initiated on Thyroid Replacement circa 1990.      Also, the patient has history of PreDiabetes and has had no symptoms of reactive hypoglycemia, diabetic polys, paresthesias or visual blurring.  Last A1c was almost to goal: Lab Results  Component Value Date   HGBA1C 5.7 (H) 03/19/2018      Further, the patient also has history of Vitamin D Deficiency and supplements vitamin D without any suspected side-effects. Last vitamin D was elevated and dose was tapered Lab Results  Component Value Date   VD25OH 127 (H) 03/19/2018   Current Outpatient Medications on File Prior to Visit  Medication Sig  . acidophilus (RISAQUAD) CAPS capsule Take 1 capsule by mouth daily.  Marland Kitchen aspirin EC 81 MG tablet Take 81 mg by mouth at bedtime.  . Biotin 1 MG CAPS Take 1 mg by mouth daily.  . bisoprolol-hydrochlorothiazide (ZIAC) 2.5-6.25 MG tablet TAKE 1 TABLET BY MOUTH EVERY DAY (Patient taking differently: TAKE 1 TABLET BY MOUTH EVERY OTHER  DAY)  . budesonide (ENTOCORT EC) 3 MG 24 hr capsule Take 3 mg by mouth daily.  . Calcium Carbonate-Vitamin D (CALCIUM 600+D HIGH POTENCY) 600-400 MG-UNIT tablet Take 1 tablet by mouth daily.  . cetirizine (ZYRTEC) 10 MG tablet Take 10 mg by mouth daily.   .  Cholecalciferol (VITAMIN D3) 2000 UNITS TABS Take 2,000 Units by mouth daily.   . Cinnamon 500 MG TABS Take 500 mg by mouth daily.  . famotidine (PEPCID) 40 MG tablet TAKE 1 TABLET(40 MG) BY MOUTH EVERY EVENING  . Flaxseed, Linseed, (FLAX SEED OIL) 1000 MG CAPS Take 1,000 mg by mouth daily.   Marland Kitchen gabapentin (NEURONTIN) 100 MG capsule 1-3 at night for pain  . levothyroxine (SYNTHROID, LEVOTHROID) 125 MCG tablet TAKE 1 TABLET BY MOUTH DAILY BEFORE BREAKFAST  . loratadine (CLARITIN) 10 MG tablet Take 10 mg by mouth daily.  Marland Kitchen MAGNESIUM PO Take 50 mg by mouth.  . Multiple Vitamins-Minerals (MULTIVITAMIN WITH MINERALS) tablet Take 1 tablet by mouth daily.  Marland Kitchen nystatin ointment (MYCOSTATIN) Apply 1 application topically 2 (two) times daily.  Marland Kitchen omega-3 acid ethyl esters (LOVAZA) 1 g capsule Take 1 g by mouth daily.  . pantoprazole (PROTONIX) 40 MG tablet TAKE 1 TABLET(40 MG) BY MOUTH DAILY  . simvastatin (ZOCOR) 20 MG tablet TAKE 1 TABLET BY MOUTH DAILY  . vitamin C (ASCORBIC ACID) 500 MG tablet Take 500 mg by mouth daily.   No current facility-administered medications on file prior to visit.    Allergies  Allergen Reactions  . Fosamax [Alendronate Sodium] Other (See Comments)    Reaction:  GI upset   . Singulair [Montelukast Sodium] Other (See  Comments)    Reaction:  Makes pt jittery  . Wellbutrin [Bupropion] Hives  . Sulfa Antibiotics Rash   PMHx:   Past Medical History:  Diagnosis Date  . Arthritis   . Benign labile hypertension   . Colitis   . DJD (degenerative joint disease)   . Endometrial polyp   . GERD (gastroesophageal reflux disease)   . History of basal cell carcinoma excision   . History of esophagitis   . Hyperlipidemia   . Hypothyroidism   . Osteopenia   . Prediabetes   . Vitamin D deficiency   . Wears glasses    Immunization History  Administered Date(s) Administered  . Influenza, High Dose Seasonal PF 07/23/2014, 08/23/2015, 07/10/2016, 08/27/2017  . Pneumococcal  Conjugate-13 05/05/2015  . Pneumococcal-Unspecified 08/22/2011  . Tdap 08/22/2011  . Zoster 06/20/2006  . Zoster Recombinat (Shingrix) 09/13/2017, 01/01/2018   Past Surgical History:  Procedure Laterality Date  . DILATATION & CURETTAGE/HYSTEROSCOPY WITH MYOSURE N/A 10/27/2014   Procedure: DILATATION & CURETTAGE/HYSTEROSCOPY WITH MYOSURE;  Surgeon: Darlyn Chamber, MD;  Location: [facility];  Service: Gynecology;  Laterality: N/A;  . DILATION AND CURETTAGE OF UTERUS    . MOHS SURGERY  2005   LEFT NASAL BRIDGE FOR BASAL CELL  . TONSILLECTOMY  age 69   FHx:    Reviewed / unchanged  SHx:    Reviewed / unchanged   Systems Review:  Constitutional: Denies fever, chills, wt changes, headaches, insomnia, fatigue, night sweats, change in appetite. Eyes: Denies redness, blurred vision, diplopia, discharge, itchy, watery eyes.  ENT: Denies discharge, congestion, post nasal drip, epistaxis, sore throat, earache, hearing loss, dental pain, tinnitus, vertigo, sinus pain, snoring.  CV: Denies chest pain, palpitations, irregular heartbeat, syncope, dyspnea, diaphoresis, orthopnea, PND, claudication or edema. Respiratory: denies cough, dyspnea, DOE, pleurisy, hoarseness, laryngitis, wheezing.  Gastrointestinal: Denies dysphagia, odynophagia, heartburn, reflux, water brash, abdominal pain or cramps, nausea, vomiting, bloating, diarrhea, constipation, hematemesis, melena, hematochezia  or hemorrhoids. Genitourinary: Denies dysuria, frequency, urgency, nocturia, hesitancy, discharge, hematuria or flank pain. Musculoskeletal: Denies arthralgias, myalgias, stiffness, jt. swelling, pain, limping or strain/sprain.  Skin: Denies pruritus, rash, hives, warts, acne, eczema or change in skin lesion(s). Neuro: No weakness, tremor, incoordination, spasms, paresthesia or pain. Psychiatric: Denies confusion, memory loss or sensory loss. Endo: Denies change in weight, skin or hair change.  Heme/Lymph: No  excessive bleeding, bruising or enlarged lymph nodes.  Physical Exam  BP 112/68   Pulse 76   Temp (!) 97.3 F (36.3 C)   Resp 16   Ht 5' 5"  (1.651 m)   Wt 107 lb (48.5 kg)   BMI 17.81 kg/m   Appears  well nourished, well groomed  and in no distress.  Eyes: PERRLA, EOMs, conjunctiva no swelling or erythema. Sinuses: No frontal/maxillary tenderness ENT/Mouth: EAC's clear, TM's nl w/o erythema, bulging. Nares clear w/o erythema, swelling, exudates. Oropharynx clear without erythema or exudates. Oral hygiene is good. Tongue normal, non obstructing. Hearing intact.  Neck: Supple. Thyroid not palpable. Car 2+/2+ without bruits, nodes or JVD. Chest: Respirations nl with BS clear & equal w/o rales, rhonchi, wheezing or stridor.  Cor: Heart sounds normal w/ regular rate and rhythm without sig. murmurs, gallops, clicks or rubs. Peripheral pulses normal and equal  without edema.  Abdomen: Soft & bowel sounds normal. Non-tender w/o guarding, rebound, hernias, masses or organomegaly.  Lymphatics: Unremarkable.  Musculoskeletal: Full ROM all peripheral extremities, joint stability, 5/5 strength and normal gait.  Skin: Warm, dry without exposed rashes,  lesions or ecchymosis apparent.  Neuro: Cranial nerves intact, reflexes equal bilaterally. Sensory-motor testing grossly intact. Tendon reflexes grossly intact.  Pysch: Alert & oriented x 3.  Insight and judgement nl & appropriate. No ideations.  Assessment and Plan:  1. Essential hypertension  - Continue medication, monitor blood pressure at home.  - Continue DASH diet.  Reminder to go to the ER if any CP,  SOB, nausea, dizziness, severe HA, changes vision/speech.  - CBC with Differential/Platelet - COMPLETE METABOLIC PANEL WITH GFR - Magnesium - TSH  2. Hyperlipidemia, mixed  - Continue diet/meds, exercise,& lifestyle modifications.  - Continue monitor periodic cholesterol/liver & renal functions   - Lipid panel - TSH  3. Abnormal  glucose  - Hemoglobin A1c - Insulin, random  4. Vitamin D deficiency  - Continue diet, exercise, lifestyle modifications.  - Monitor appropriate labs. - Continue supplementation.  - VITAMIN D 25 Hydroxyl  5. Hypothyroidism  - TSH  6. GERD  - CBC with Differential/Platelet  7. Medication management  - CBC with Differential/Platelet - COMPLETE METABOLIC PANEL WITH GFR - Magnesium - Lipid panel - TSH - Hemoglobin A1c - Insulin, random - VITAMIN D 25 Hydroxyl         Discussed  regular exercise, BP monitoring, weight control to achieve/maintain BMI less than 25 and discussed med and SE's. Recommended labs to assess and monitor clinical status with further disposition pending results of labs. Over 30 minutes of exam, counseling, chart review was performed.

## 2018-07-24 NOTE — Patient Instructions (Signed)

## 2018-07-25 ENCOUNTER — Encounter: Payer: Self-pay | Admitting: Internal Medicine

## 2018-07-25 LAB — CBC WITH DIFFERENTIAL/PLATELET
BASOS PCT: 0.4 %
Basophils Absolute: 40 cells/uL (ref 0–200)
EOS ABS: 80 {cells}/uL (ref 15–500)
Eosinophils Relative: 0.8 %
HEMATOCRIT: 42.2 % (ref 35.0–45.0)
Hemoglobin: 14.1 g/dL (ref 11.7–15.5)
LYMPHS ABS: 1640 {cells}/uL (ref 850–3900)
MCH: 31.5 pg (ref 27.0–33.0)
MCHC: 33.4 g/dL (ref 32.0–36.0)
MCV: 94.4 fL (ref 80.0–100.0)
MPV: 11.1 fL (ref 7.5–12.5)
Monocytes Relative: 8.5 %
NEUTROS PCT: 73.9 %
Neutro Abs: 7390 cells/uL (ref 1500–7800)
PLATELETS: 204 10*3/uL (ref 140–400)
RBC: 4.47 10*6/uL (ref 3.80–5.10)
RDW: 13.2 % (ref 11.0–15.0)
Total Lymphocyte: 16.4 %
WBC: 10 10*3/uL (ref 3.8–10.8)
WBCMIX: 850 {cells}/uL (ref 200–950)

## 2018-07-25 LAB — COMPLETE METABOLIC PANEL WITH GFR
AG Ratio: 2.4 (calc) (ref 1.0–2.5)
ALBUMIN MSPROF: 4.3 g/dL (ref 3.6–5.1)
ALT: 15 U/L (ref 6–29)
AST: 20 U/L (ref 10–35)
Alkaline phosphatase (APISO): 51 U/L (ref 33–130)
BILIRUBIN TOTAL: 0.5 mg/dL (ref 0.2–1.2)
BUN / CREAT RATIO: 14 (calc) (ref 6–22)
BUN: 14 mg/dL (ref 7–25)
CALCIUM: 9.6 mg/dL (ref 8.6–10.4)
CHLORIDE: 105 mmol/L (ref 98–110)
CO2: 27 mmol/L (ref 20–32)
Creat: 0.97 mg/dL — ABNORMAL HIGH (ref 0.60–0.93)
GFR, EST AFRICAN AMERICAN: 66 mL/min/{1.73_m2} (ref >=60)
GFR, EST NON AFRICAN AMERICAN: 57 mL/min/{1.73_m2} — AB (ref >=60)
GLUCOSE: 85 mg/dL (ref 65–99)
Globulin: 1.8 g/dL (calc) — ABNORMAL LOW (ref 1.9–3.7)
Potassium: 4.7 mmol/L (ref 3.5–5.3)
Sodium: 140 mmol/L (ref 135–146)
TOTAL PROTEIN: 6.1 g/dL (ref 6.1–8.1)

## 2018-07-25 LAB — LIPID PANEL
Cholesterol: 187 mg/dL (ref <200)
HDL: 67 mg/dL (ref >50)
LDL CHOLESTEROL (CALC): 103 mg/dL — AB
NON-HDL CHOLESTEROL (CALC): 120 mg/dL (ref <130)
Total CHOL/HDL Ratio: 2.8 (calc) (ref <5.0)
Triglycerides: 80 mg/dL (ref <150)

## 2018-07-25 LAB — MAGNESIUM: Magnesium: 1.9 mg/dL (ref 1.5–2.5)

## 2018-07-25 LAB — INSULIN, RANDOM: INSULIN: 8.9 u[IU]/mL (ref 2.0–19.6)

## 2018-07-25 LAB — HEMOGLOBIN A1C
EAG (MMOL/L): 6.3 (calc)
Hgb A1c MFr Bld: 5.6 % of total Hgb (ref <5.7)
Mean Plasma Glucose: 114 (calc)

## 2018-07-25 LAB — VITAMIN D 25 HYDROXY (VIT D DEFICIENCY, FRACTURES): VIT D 25 HYDROXY: 55 ng/mL (ref 30–100)

## 2018-07-25 LAB — TSH: TSH: 0.43 m[IU]/L (ref 0.40–4.50)

## 2018-08-22 DIAGNOSIS — Z23 Encounter for immunization: Secondary | ICD-10-CM | POA: Diagnosis not present

## 2018-08-26 ENCOUNTER — Other Ambulatory Visit: Payer: Self-pay | Admitting: Neurological Surgery

## 2018-09-03 ENCOUNTER — Other Ambulatory Visit: Payer: Self-pay | Admitting: Internal Medicine

## 2018-09-03 DIAGNOSIS — K2100 Gastro-esophageal reflux disease with esophagitis, without bleeding: Secondary | ICD-10-CM

## 2018-10-13 ENCOUNTER — Other Ambulatory Visit (HOSPITAL_COMMUNITY): Payer: Medicare Other

## 2018-10-24 ENCOUNTER — Ambulatory Visit: Payer: Self-pay | Admitting: Adult Health Nurse Practitioner

## 2018-10-24 ENCOUNTER — Ambulatory Visit: Payer: Self-pay | Admitting: Adult Health

## 2018-11-09 ENCOUNTER — Other Ambulatory Visit: Payer: Self-pay | Admitting: Internal Medicine

## 2018-11-09 DIAGNOSIS — I10 Essential (primary) hypertension: Secondary | ICD-10-CM

## 2018-11-14 ENCOUNTER — Ambulatory Visit: Payer: Self-pay | Admitting: Adult Health Nurse Practitioner

## 2018-11-14 NOTE — Pre-Procedure Instructions (Signed)
BEYZA BELLINO  11/14/2018      [id number] [address], [place] AT [address] [place] [address] Phone: [phone] Fax: [phone]    Your procedure is scheduled on January 7   Report to [place] at [place].M.  Call this number if you have problems the morning of surgery:  [phone]   Remember:  Do not eat or drink after midnight.     Take these medicines the morning of surgery with A SIP OF WATER  bisoprolol-hydrochlorothiazide (ZIAC) levothyroxine (SYNTHROID, LEVOTHROID)  pantoprazole (PROTONIX)  7 days prior to surgery STOP taking any Aspirin (unless otherwise instructed by your surgeon), Aleve, Naproxen, Ibuprofen, Motrin, Advil, Goody's, BC's, all herbal medications, fish oil, and all vitamins.    Do not wear jewelry, make-up or nail polish.  Do not wear lotions, powders, or perfumes, or deodorant.  Do not shave 48 hours prior to surgery.    Do not bring valuables to the hospital.  [facility] is not responsible for any belongings or valuables.  Contacts, dentures or bridgework may not be worn into surgery.  Leave your suitcase in the car.  After surgery it may be brought to your room.  For patients admitted to the hospital, discharge time will be determined by your treatment team.  Patients discharged the day of surgery will not be allowed to drive home.    Special instructions:   [facility]- Preparing For Surgery  Before surgery, you can play an important role. Because skin is not sterile, your skin needs to be as free of germs as possible. You can reduce the number of germs on your skin by washing with CHG (chlorahexidine gluconate) Soap before surgery.  CHG is an antiseptic cleaner which kills germs and bonds with the skin to continue killing germs even after washing.    Oral Hygiene is also important to reduce your risk of infection.  Remember -  BRUSH YOUR TEETH THE MORNING OF SURGERY WITH YOUR REGULAR TOOTHPASTE  Please do not use if you have an allergy to CHG or antibacterial soaps. If your skin becomes reddened/irritated stop using the CHG.  Do not shave (including legs and underarms) for at least 48 hours prior to first CHG shower. It is OK to shave your face.  Please follow these instructions carefully.   1. Shower the NIGHT BEFORE SURGERY and the MORNING OF SURGERY with CHG.   2. If you chose to wash your hair, wash your hair first as usual with your normal shampoo.  3. After you shampoo, rinse your hair and body thoroughly to remove the shampoo.  4. Use CHG as you would any other liquid soap. You can apply CHG directly to the skin and wash gently with a scrungie or a clean washcloth.   5. Apply the CHG Soap to your body ONLY FROM THE NECK DOWN.  Do not use on open wounds or open sores. Avoid contact with your eyes, ears, mouth and genitals (private parts). Wash Face and genitals (private parts)  with your normal soap.  6. Wash thoroughly, paying special attention to the area where your surgery will be performed.  7. Thoroughly rinse your body with warm water from the neck down.  8. DO NOT shower/wash with your normal soap after using and rinsing off the CHG Soap.  9. Pat yourself dry with a CLEAN TOWEL.  10. Wear CLEAN PAJAMAS  to bed the night before surgery, wear comfortable clothes the morning of surgery  11. Place CLEAN SHEETS on your bed the night of your first shower and DO NOT SLEEP WITH PETS.    Day of Surgery:  Do not apply any deodorants/lotions.  Please wear clean clothes to the hospital/surgery center.   Remember to brush your teeth WITH YOUR REGULAR TOOTHPASTE.    Please read over the following fact sheets that you were given.

## 2018-11-14 NOTE — Progress Notes (Deleted)
MEDICARE ANNUAL WELLNESS VISIT AND FOLLOW UP  Assessment:    Over 40 minutes of exam, counseling, chart review and critical decision making was performed Future Appointments  Date Time Provider [place]  11/14/2018  9:00 AM Garnet Sierras, NP [facility] None  11/17/2018 10:00 AM [facility] PAT 1 [facility] None  01/12/2019  2:00 PM Vicie Mutters, PA-C [facility] None     Plan:   During the course of the visit the patient was educated and counseled about appropriate screening and preventive services including:    Pneumococcal vaccine   Prevnar 13  Influenza vaccine  Td vaccine  Screening electrocardiogram  Bone densitometry screening  Colorectal cancer screening  Diabetes screening  Glaucoma screening  Nutrition counseling   Advanced directives: requested   Subjective:  Debbie Houston is a 77 y.o. female who presents for Medicare Annual Wellness Visit and 3 month follow up.   She has had elevated blood pressure for *** years. Her blood pressure {HAS HAS NOT:18834} been controlled at home, today their BP is   She {DOES_DOES YNW:29562} workout. She denies chest pain, shortness of breath, dizziness.  She {ACTION; IS/IS ZHY:86578469} on cholesterol medication and denies myalgias. Her cholesterol {ACTION; IS/IS NOT:21021397} at goal. The cholesterol last visit was:   Lab Results  Component Value Date   CHOL 187 07/24/2018   HDL 67 07/24/2018   LDLCALC 103 (H) 07/24/2018   TRIG 80 07/24/2018   CHOLHDL 2.8 07/24/2018   She has had diabetes for *** years. She {Has/has not:18111} been working on diet and exercise for ***prediabetes, and denies {Symptoms; diabetes w/o none:19199}. Last A1C in the office was:  Lab Results  Component Value Date   HGBA1C 5.6 07/24/2018   Last GFR:   Lab Results  Component Value Date   GFRNONAA 57 (L) 07/24/2018   Lab Results  Component Value Date   GFRAA 66 07/24/2018   Patient is on Vitamin D supplement.   Lab  Results  Component Value Date   VD25OH 55 07/24/2018      Medication Review: Current Outpatient Medications on File Prior to Visit  Medication Sig Dispense Refill  . Ascorbic Acid (VITAMIN C) 1000 MG tablet Take 1,000 mg by mouth daily.     Marland Kitchen aspirin EC 81 MG tablet Take 81 mg by mouth daily.     . Biotin w/ Vitamins C & E (HAIR/SKIN/NAILS PO) Take 1 tablet by mouth daily.    . bisoprolol-hydrochlorothiazide (ZIAC) 2.5-6.25 MG tablet Take 1 tablet daily for BP 90 tablet 3  . budesonide (ENTOCORT EC) 3 MG 24 hr capsule Take 3 mg by mouth daily.    . Calcium Carbonate-Vitamin D (CALCIUM 600+D HIGH POTENCY) 600-400 MG-UNIT tablet Take 1 tablet by mouth daily.    . Cinnamon 500 MG TABS Take 1,000 mg by mouth daily.     . Coenzyme Q10 (COQ10) 200 MG CAPS Take 200 mg by mouth daily.    . famotidine (PEPCID) 40 MG tablet TAKE 1 TABLET(40 MG) BY MOUTH EVERY EVENING (Patient not taking: Reported on 11/04/2018) 90 tablet 1  . Flaxseed, Linseed, (FLAX SEED OIL) 1000 MG CAPS Take 1,000 mg by mouth daily.     Marland Kitchen gabapentin (NEURONTIN) 100 MG capsule 1-3 at night for pain (Patient not taking: Reported on 11/04/2018) 90 capsule 1  . levothyroxine (SYNTHROID, LEVOTHROID) 125 MCG tablet TAKE 1 TABLET BY MOUTH DAILY BEFORE BREAKFAST (Patient taking differently: Take 62.5-125 mcg by mouth See admin instructions. Take 125 mcg by mouth daily  on Tuesday, Thursday and Saturday. Take 62.5 mcg by mouth daily on all other days.) 90 tablet 1  . Multiple Vitamins-Minerals (MULTIVITAMIN WITH MINERALS) tablet Take 1 tablet by mouth daily.    . Omega-3 Fatty Acids (FISH OIL) 1000 MG CAPS Take 1,000 mg by mouth daily.    . pantoprazole (PROTONIX) 40 MG tablet TAKE 1 TABLET(40 MG) BY MOUTH DAILY (Patient taking differently: Take 40 mg by mouth daily. ) 90 tablet 0  . Polyethyl Glycol-Propyl Glycol (SYSTANE) 0.4-0.3 % SOLN Place 1 drop into both eyes 2 (two) times daily as needed (for dry eyes).    . Probiotic Product  (PROBIOTIC PO) Take 1 capsule by mouth daily.    . simvastatin (ZOCOR) 20 MG tablet TAKE 1 TABLET BY MOUTH DAILY (Patient taking differently: Take 20 mg by mouth every other day. ) 90 tablet 0   No current facility-administered medications on file prior to visit.     Allergies  Allergen Reactions  . Fosamax [Alendronate Sodium] Other (See Comments)    Reaction:  GI upset   . Singulair [Montelukast Sodium] Other (See Comments)    Reaction:  Makes pt jittery  . Wellbutrin [Bupropion] Hives  . Sulfa Antibiotics Rash    Current Problems (verified) Patient Active Problem List   Diagnosis Date Noted  . Colitis 12/21/2014  . Medication management 04/20/2014  . Essential hypertension   . Hyperlipidemia   . Hypothyroid   . GERD   . Asthma   . DJD (degenerative joint disease)   . Vitamin D deficiency   . Osteopenia   . Allergy   . Anemia     Screening Tests Immunization History  Administered Date(s) Administered  . Influenza, High Dose Seasonal PF 07/23/2014, 08/23/2015, 07/10/2016, 08/27/2017  . Pneumococcal Conjugate-13 05/05/2015  . Pneumococcal-Unspecified 08/22/2011  . Tdap 08/22/2011  . Zoster 06/20/2006  . Zoster Recombinat (Shingrix) 09/13/2017, 01/01/2018    Preventative care: Last colonoscopy: 2017 Last mammogram: 2019 Last pap smear/pelvic exam: 2012  DEXA: 2017  Prior vaccinations: TD or Tdap: 2012, Due 2022  Influenza: Due Pneumococcal: 2007 Prevnar13: 2016 Shingles/Zostavax: 2019, complete  Names of Other Physician/Practitioners you currently use: 1. [facility] here for primary care 2. ***, eye doctor, last visit *** 3. ***, dentist, last visit *** Patient Care Team: Unk Pinto, MD as PCP - General (Internal Medicine)  SURGICAL HISTORY She  has a past surgical history that includes Mohs surgery (2005); Tonsillectomy (age 89); Dilation and curettage of uterus; and Dilatation & curettage/hysteroscopy with  myosure (N/A, 10/27/2014). FAMILY HISTORY Her family history includes Cancer in her father, maternal grandmother, mother, and sister; Diabetes in her brother and maternal grandfather; Hyperlipidemia in her brother, father, maternal grandfather, maternal grandmother, mother, paternal grandfather, paternal grandmother, and sister; Hypertension in her mother; Stroke in her maternal grandfather. SOCIAL HISTORY She  reports that she has been smoking cigarettes. She has smoked for the past 15.00 years. She has never used smokeless tobacco. She reports current alcohol use. She reports that she does not use drugs.   MEDICARE WELLNESS OBJECTIVES: Physical activity:   Cardiac risk factors:   Depression/mood screen:   Depression screen [facility] 2/9 07/24/2018  Decreased Interest 0  Down, Depressed, Hopeless 0  PHQ - 2 Score 0    ADLs:  In your present state of health, do you have any difficulty performing the following activities: 07/24/2018 03/22/2018  Hearing? N N  Vision? N N  Difficulty concentrating or making decisions? N N  Walking or climbing stairs? N N  Dressing or bathing? N -  Doing errands, shopping? N -  Some recent data might be hidden     Cognitive Testing  Alert? Yes  Normal Appearance?Yes  Oriented to person? Yes  Place? Yes   Time? Yes  Recall of three objects?  Yes  Can perform simple calculations? Yes  Displays appropriate judgment?Yes  Can read the correct time from a watch face?Yes  EOL planning:    ROS   Objective:     There were no vitals filed for this visit. There is no height or weight on file to calculate BMI.  General appearance: alert, no distress, WD/WN, female HEENT: normocephalic, sclerae anicteric, TMs pearly, nares patent, no discharge or erythema, pharynx normal Oral cavity: MMM, no lesions Neck: supple, no lymphadenopathy, no thyromegaly, no masses Heart: RRR, normal S1, S2, no murmurs Lungs: CTA bilaterally, no wheezes, rhonchi, or rales Abdomen:  +bs, soft, non tender, non distended, no masses, no hepatomegaly, no splenomegaly Musculoskeletal: nontender, no swelling, no obvious deformity Extremities: no edema, no cyanosis, no clubbing Pulses: 2+ symmetric, upper and lower extremities, normal cap refill Neurological: alert, oriented x 3, CN2-12 intact, strength normal upper extremities and lower extremities, sensation normal throughout, DTRs 2+ throughout, no cerebellar signs, gait normal Psychiatric: normal affect, behavior normal, pleasant   Medicare Attestation I have personally reviewed: The patient's medical and social history Their use of alcohol, tobacco or illicit drugs Their current medications and supplements The patient's functional ability including ADLs,fall risks, home safety risks, cognitive, and hearing and visual impairment Diet and physical activities Evidence for depression or mood disorders  The patient's weight, height, BMI, and visual acuity have been recorded in the chart.  I have made referrals, counseling, and provided education to the patient based on review of the above and I have provided the patient with a written personalized care plan for preventive services.     Garnet Sierras, NP   11/14/2018

## 2018-11-17 ENCOUNTER — Encounter (HOSPITAL_COMMUNITY)
Admission: RE | Admit: 2018-11-17 | Discharge: 2018-11-17 | Disposition: A | Discharge status: Home / Self Care | Payer: Medicare Other | Source: Ambulatory Visit | Attending: Neurological Surgery | Admitting: Neurological Surgery

## 2018-11-17 ENCOUNTER — Other Ambulatory Visit: Payer: Self-pay

## 2018-11-17 ENCOUNTER — Encounter (HOSPITAL_COMMUNITY): Payer: Self-pay

## 2018-11-17 DIAGNOSIS — Z01812 Encounter for preprocedural laboratory examination: Secondary | ICD-10-CM | POA: Insufficient documentation

## 2018-11-17 HISTORY — DX: Unspecified hearing loss, unspecified ear: H91.90

## 2018-11-17 HISTORY — DX: Malignant (primary) neoplasm, unspecified: C80.1

## 2018-11-17 LAB — BASIC METABOLIC PANEL
Anion gap: 11 (ref 5–15)
BUN: 11 mg/dL (ref 8–23)
CHLORIDE: 98 mmol/L (ref 98–111)
CO2: 24 mmol/L (ref 22–32)
Calcium: 9.4 mg/dL (ref 8.9–10.3)
Creatinine, Ser: 0.79 mg/dL (ref 0.44–1.00)
GFR calc Af Amer: >60 mL/min (ref >60)
GFR calc non Af Amer: >60 mL/min (ref >60)
Glucose, Bld: 101 mg/dL — ABNORMAL HIGH (ref 70–99)
Potassium: 4.3 mmol/L (ref 3.5–5.1)
Sodium: 133 mmol/L — ABNORMAL LOW (ref 135–145)

## 2018-11-17 LAB — CBC
HCT: 41.3 % (ref 36.0–46.0)
HEMOGLOBIN: 13.4 g/dL (ref 12.0–15.0)
MCH: 31.8 pg (ref 26.0–34.0)
MCHC: 32.4 g/dL (ref 30.0–36.0)
MCV: 98.1 fL (ref 80.0–100.0)
Platelets: 231 10*3/uL (ref 150–400)
RBC: 4.21 MIL/uL (ref 3.87–5.11)
RDW: 13.3 % (ref 11.5–15.5)
WBC: 7.5 10*3/uL (ref 4.0–10.5)
nRBC: 0 % (ref 0.0–0.2)

## 2018-11-17 LAB — SURGICAL PCR SCREEN
MRSA, PCR: NEGATIVE
STAPHYLOCOCCUS AUREUS: NEGATIVE

## 2018-11-17 LAB — TYPE AND SCREEN
ABO/RH(D): A POS
Antibody Screen: NEGATIVE

## 2018-11-17 LAB — HEMOGLOBIN A1C
Hgb A1c MFr Bld: 5.4 % (ref 4.8–5.6)
Mean Plasma Glucose: 108.28 mg/dL

## 2018-11-17 LAB — ABO/RH: ABO/RH(D): A POS

## 2018-11-17 NOTE — Progress Notes (Addendum)
PCP is Dr. Magnus Sinning  LOV 06/2018 Denies murmur, cp,sob.  No cardiac testing done. She was told she was "pre" diabetic.  Last A1C was 07/2018 5.6 Feb ekg showed RBBB, spoke with Lorretta Harp PA.  No change from previous ekg.   Doesn't need to see chart.

## 2018-11-17 NOTE — Pre-Procedure Instructions (Signed)
Debbie Houston  11/17/2018      [id number] [address], [place] AT [address] [place] [address] Phone: [phone] Fax: [phone]    Your procedure is scheduled on Tuesday,  January 7    Report to [place] at [place].M.             (posted surgery time 10:54a - 2:31p)   Call this number if you have problems the morning of surgery:  [phone]   Remember:  Do not eat any foods or drink any liquids after midnight, Monday.     Take these medicines the morning of surgery with A SIP OF WATER  bisoprolol-hydrochlorothiazide (ZIAC) levothyroxine (SYNTHROID, LEVOTHROID)  pantoprazole (PROTONIX)  7 days prior to surgery STOP taking any Aspirin (unless otherwise instructed by your surgeon), Aleve, Naproxen, Ibuprofen, Motrin, Advil, Goody's, BC's, all herbal medications, fish oil, and all vitamins.    Do not wear jewelry, make-up or nail polish.  Do not wear lotions, powders,  perfumes, or deodorant.  Do not shave 48 hours prior to surgery.    Do not bring valuables to the hospital.  [facility] is not responsible for any belongings or valuables.  Contacts, dentures or bridgework may not be worn into surgery.  Leave your suitcase in the car.  After surgery it may be brought to your room.  For patients admitted to the hospital, discharge time will be determined by your treatment team.     Special instructions:   [place]- Preparing For Surgery  Before surgery, you can play an important role. Because skin is not sterile, your skin needs to be as free of germs as possible. You can reduce the number of germs on your skin by washing with CHG (chlorahexidine gluconate) Soap before surgery.  CHG is an antiseptic cleaner which kills germs and bonds with the skin to continue killing germs even after washing.    Oral Hygiene is also important to reduce your risk of  infection.    Remember - BRUSH YOUR TEETH THE MORNING OF SURGERY WITH YOUR REGULAR TOOTHPASTE  Please do not use if you have an allergy to CHG or antibacterial soaps. If your skin becomes reddened/irritated stop using the CHG.  Do not shave (including legs and underarms) for at least 48 hours prior to first CHG shower. It is OK to shave your face.  Please follow these instructions carefully.   1. Shower the NIGHT BEFORE SURGERY and the MORNING OF SURGERY with CHG.   2. If you chose to wash your hair, wash your hair first as usual with your normal shampoo.  3. After you shampoo, rinse your hair and body thoroughly to remove the shampoo.  4. Use CHG as you would any other liquid soap. You can apply CHG directly to the skin and wash gently with a scrungie or a clean washcloth.   5. Apply the CHG Soap to your body ONLY FROM THE NECK DOWN.  Do not use on open wounds or open sores. Avoid contact with your eyes, ears, mouth and genitals (private parts). Wash Face and genitals (private parts)  with your normal soap.  6. Wash thoroughly, paying special attention to the area where your surgery will be performed.  7. Thoroughly rinse your body with warm water from the neck down.  8. DO NOT shower/wash with your normal soap after using and rinsing off the  CHG Soap.  9. Pat yourself dry with a CLEAN TOWEL.  10. Wear CLEAN PAJAMAS to bed the night before surgery, wear comfortable clothes the morning of surgery  11. Place CLEAN SHEETS on your bed the night of your first shower and DO NOT SLEEP WITH PETS.    Day of Surgery:  Do not apply any deodorants/lotions.  Please wear clean clothes to the hospital/surgery center.   Remember to brush your teeth WITH YOUR REGULAR TOOTHPASTE.    Please read over the following fact sheets that you were given.

## 2018-11-19 HISTORY — PX: BACK SURGERY: SHX140

## 2018-11-19 HISTORY — PX: CATARACT EXTRACTION, BILATERAL: SHX1313

## 2018-11-25 ENCOUNTER — Inpatient Hospital Stay (HOSPITAL_COMMUNITY)
Admission: RE | Admit: 2018-11-25 | Discharge: 2018-11-26 | DRG: 455 | Disposition: A | Discharge status: Home / Self Care | Payer: Medicare Other | Attending: Neurological Surgery | Admitting: Neurological Surgery

## 2018-11-25 ENCOUNTER — Encounter (HOSPITAL_COMMUNITY): Payer: Self-pay | Admitting: Certified Registered Nurse Anesthetist

## 2018-11-25 ENCOUNTER — Inpatient Hospital Stay (HOSPITAL_COMMUNITY): Payer: Medicare Other

## 2018-11-25 ENCOUNTER — Encounter (HOSPITAL_COMMUNITY)
Admission: RE | Disposition: A | Discharge status: Home / Self Care | Payer: Self-pay | Source: Home / Self Care | Attending: Neurological Surgery

## 2018-11-25 ENCOUNTER — Other Ambulatory Visit: Payer: Self-pay

## 2018-11-25 ENCOUNTER — Inpatient Hospital Stay (HOSPITAL_COMMUNITY): Payer: Medicare Other | Admitting: Certified Registered Nurse Anesthetist

## 2018-11-25 ENCOUNTER — Inpatient Hospital Stay (HOSPITAL_COMMUNITY): Payer: Medicare Other | Admitting: Physician Assistant

## 2018-11-25 DIAGNOSIS — F17200 Nicotine dependence, unspecified, uncomplicated: Secondary | ICD-10-CM | POA: Diagnosis present

## 2018-11-25 DIAGNOSIS — I10 Essential (primary) hypertension: Secondary | ICD-10-CM | POA: Diagnosis present

## 2018-11-25 DIAGNOSIS — R7303 Prediabetes: Secondary | ICD-10-CM | POA: Diagnosis not present

## 2018-11-25 DIAGNOSIS — Z7951 Long term (current) use of inhaled steroids: Secondary | ICD-10-CM

## 2018-11-25 DIAGNOSIS — K219 Gastro-esophageal reflux disease without esophagitis: Secondary | ICD-10-CM | POA: Diagnosis present

## 2018-11-25 DIAGNOSIS — Z79899 Other long term (current) drug therapy: Secondary | ICD-10-CM

## 2018-11-25 DIAGNOSIS — Z79890 Hormone replacement therapy (postmenopausal): Secondary | ICD-10-CM | POA: Diagnosis not present

## 2018-11-25 DIAGNOSIS — M4316 Spondylolisthesis, lumbar region: Secondary | ICD-10-CM | POA: Diagnosis not present

## 2018-11-25 DIAGNOSIS — M545 Low back pain: Secondary | ICD-10-CM | POA: Diagnosis not present

## 2018-11-25 DIAGNOSIS — M48061 Spinal stenosis, lumbar region without neurogenic claudication: Secondary | ICD-10-CM | POA: Diagnosis present

## 2018-11-25 DIAGNOSIS — M5416 Radiculopathy, lumbar region: Secondary | ICD-10-CM | POA: Diagnosis present

## 2018-11-25 DIAGNOSIS — Z981 Arthrodesis status: Secondary | ICD-10-CM | POA: Diagnosis not present

## 2018-11-25 DIAGNOSIS — E7800 Pure hypercholesterolemia, unspecified: Secondary | ICD-10-CM | POA: Diagnosis present

## 2018-11-25 DIAGNOSIS — Z419 Encounter for procedure for purposes other than remedying health state, unspecified: Secondary | ICD-10-CM

## 2018-11-25 DIAGNOSIS — E039 Hypothyroidism, unspecified: Secondary | ICD-10-CM | POA: Diagnosis present

## 2018-11-25 SURGERY — POSTERIOR LUMBAR FUSION 1 LEVEL
Anesthesia: General | Site: Back

## 2018-11-25 MED ORDER — MIDAZOLAM HCL 2 MG/2ML IJ SOLN
INTRAMUSCULAR | Status: DC | PRN
  Administered 2018-11-25: 2 mg via INTRAVENOUS

## 2018-11-25 MED ORDER — FENTANYL CITRATE (PF) 250 MCG/5ML IJ SOLN
INTRAMUSCULAR | Status: AC
  Filled 2018-11-25: qty 5

## 2018-11-25 MED ORDER — ONDANSETRON HCL 4 MG/2ML IJ SOLN
INTRAMUSCULAR | Status: AC
  Filled 2018-11-25: qty 2

## 2018-11-25 MED ORDER — PHENOL 1.4 % MT LIQD
1.0000 | OROMUCOSAL | Status: DC | PRN
  Administered 2018-11-26: 1 via OROMUCOSAL
  Filled 2018-11-25: qty 177

## 2018-11-25 MED ORDER — POLYVINYL ALCOHOL 1.4 % OP SOLN: 1.0000 [drp] | Freq: Two times a day (BID) | OPHTHALMIC | Status: DC | PRN

## 2018-11-25 MED ORDER — LEVOTHYROXINE SODIUM 125 MCG PO TABS: 125.0000 ug | ORAL_TABLET | ORAL | Status: DC

## 2018-11-25 MED ORDER — METHOCARBAMOL 1000 MG/10ML IJ SOLN: 500.0000 mg | Freq: Four times a day (QID) | INTRAVENOUS | Status: DC | PRN

## 2018-11-25 MED ORDER — BUPIVACAINE HCL (PF) 0.5 % IJ SOLN
INTRAMUSCULAR | Status: AC
  Filled 2018-11-25: qty 30

## 2018-11-25 MED ORDER — LACTATED RINGERS IV SOLN: INTRAVENOUS | Status: DC

## 2018-11-25 MED ORDER — ONDANSETRON HCL 4 MG/2ML IJ SOLN
INTRAMUSCULAR | Status: DC | PRN
  Administered 2018-11-25: 4 mg via INTRAVENOUS

## 2018-11-25 MED ORDER — BISACODYL 10 MG RE SUPP: 10.0000 mg | Freq: Every day | RECTAL | Status: DC | PRN

## 2018-11-25 MED ORDER — OXYCODONE-ACETAMINOPHEN 5-325 MG PO TABS
1.0000 | ORAL_TABLET | ORAL | Status: DC | PRN
  Administered 2018-11-25 – 2018-11-26 (×2): 1 via ORAL
  Filled 2018-11-25 (×2): qty 1

## 2018-11-25 MED ORDER — LACTATED RINGERS IV SOLN
INTRAVENOUS | Status: DC
  Administered 2018-11-25 (×2): via INTRAVENOUS

## 2018-11-25 MED ORDER — SODIUM CHLORIDE 0.9 % IV SOLN
INTRAVENOUS | Status: DC | PRN
  Administered 2018-11-25: 25 ug/min via INTRAVENOUS

## 2018-11-25 MED ORDER — MENTHOL 3 MG MT LOZG: 1.0000 | LOZENGE | OROMUCOSAL | Status: DC | PRN

## 2018-11-25 MED ORDER — CEFAZOLIN SODIUM-DEXTROSE 2-4 GM/100ML-% IV SOLN
INTRAVENOUS | Status: AC
  Filled 2018-11-25: qty 100

## 2018-11-25 MED ORDER — LIDOCAINE-EPINEPHRINE 1 %-1:100000 IJ SOLN
INTRAMUSCULAR | Status: AC
  Filled 2018-11-25: qty 1

## 2018-11-25 MED ORDER — MEPERIDINE HCL 50 MG/ML IJ SOLN: 6.2500 mg | INTRAMUSCULAR | Status: DC | PRN

## 2018-11-25 MED ORDER — DOCUSATE SODIUM 100 MG PO CAPS
100.0000 mg | ORAL_CAPSULE | Freq: Two times a day (BID) | ORAL | Status: DC
  Administered 2018-11-25: 100 mg via ORAL
  Filled 2018-11-25: qty 1

## 2018-11-25 MED ORDER — MIDAZOLAM HCL 2 MG/2ML IJ SOLN
INTRAMUSCULAR | Status: AC
  Filled 2018-11-25: qty 2

## 2018-11-25 MED ORDER — ROCURONIUM BROMIDE 50 MG/5ML IV SOSY
PREFILLED_SYRINGE | INTRAVENOUS | Status: AC
  Filled 2018-11-25: qty 10

## 2018-11-25 MED ORDER — BUDESONIDE 3 MG PO CPEP
3.0000 mg | ORAL_CAPSULE | Freq: Every day | ORAL | Status: DC
  Filled 2018-11-25: qty 1

## 2018-11-25 MED ORDER — THROMBIN 5000 UNITS EX SOLR
CUTANEOUS | Status: AC
  Filled 2018-11-25: qty 5000

## 2018-11-25 MED ORDER — THROMBIN 5000 UNITS EX SOLR
OROMUCOSAL | Status: DC | PRN
  Administered 2018-11-25: 12:00:00 via TOPICAL

## 2018-11-25 MED ORDER — METHOCARBAMOL 500 MG PO TABS: 500.0000 mg | ORAL_TABLET | Freq: Four times a day (QID) | ORAL | Status: DC | PRN

## 2018-11-25 MED ORDER — HYDROMORPHONE HCL 1 MG/ML IJ SOLN: 0.2500 mg | INTRAMUSCULAR | Status: DC | PRN

## 2018-11-25 MED ORDER — THROMBIN 20000 UNITS EX SOLR
CUTANEOUS | Status: AC
  Filled 2018-11-25: qty 20000

## 2018-11-25 MED ORDER — PHENYLEPHRINE 40 MCG/ML (10ML) SYRINGE FOR IV PUSH (FOR BLOOD PRESSURE SUPPORT)
PREFILLED_SYRINGE | INTRAVENOUS | Status: AC
  Filled 2018-11-25: qty 10

## 2018-11-25 MED ORDER — DEXAMETHASONE SODIUM PHOSPHATE 10 MG/ML IJ SOLN
INTRAMUSCULAR | Status: DC | PRN
  Administered 2018-11-25: 10 mg via INTRAVENOUS

## 2018-11-25 MED ORDER — FENTANYL CITRATE (PF) 250 MCG/5ML IJ SOLN
INTRAMUSCULAR | Status: DC | PRN
  Administered 2018-11-25: 50 ug via INTRAVENOUS
  Administered 2018-11-25 (×2): 75 ug via INTRAVENOUS
  Administered 2018-11-25: 50 ug via INTRAVENOUS

## 2018-11-25 MED ORDER — ONDANSETRON HCL 4 MG/2ML IJ SOLN: 4.0000 mg | Freq: Four times a day (QID) | INTRAMUSCULAR | Status: DC | PRN

## 2018-11-25 MED ORDER — DEXAMETHASONE SODIUM PHOSPHATE 10 MG/ML IJ SOLN
INTRAMUSCULAR | Status: AC
  Filled 2018-11-25: qty 1

## 2018-11-25 MED ORDER — TRAMADOL HCL 50 MG PO TABS: 50.0000 mg | ORAL_TABLET | Freq: Four times a day (QID) | ORAL | Status: DC | PRN

## 2018-11-25 MED ORDER — POLYETHYL GLYCOL-PROPYL GLYCOL 0.4-0.3 % OP SOLN: 1.0000 [drp] | Freq: Two times a day (BID) | OPHTHALMIC | Status: DC | PRN

## 2018-11-25 MED ORDER — LIDOCAINE 2% (20 MG/ML) 5 ML SYRINGE
INTRAMUSCULAR | Status: AC
  Filled 2018-11-25: qty 5

## 2018-11-25 MED ORDER — SODIUM CHLORIDE 0.9 % IV SOLN
INTRAVENOUS | Status: DC | PRN
  Administered 2018-11-25: 12:00:00

## 2018-11-25 MED ORDER — BUPIVACAINE HCL (PF) 0.5 % IJ SOLN
INTRAMUSCULAR | Status: DC | PRN
  Administered 2018-11-25: 5 mL
  Administered 2018-11-25: 20 mL

## 2018-11-25 MED ORDER — SENNA 8.6 MG PO TABS
1.0000 | ORAL_TABLET | Freq: Two times a day (BID) | ORAL | Status: DC
  Administered 2018-11-25: 8.6 mg via ORAL
  Filled 2018-11-25: qty 1

## 2018-11-25 MED ORDER — ACETAMINOPHEN 325 MG PO TABS: 650.0000 mg | ORAL_TABLET | ORAL | Status: DC | PRN

## 2018-11-25 MED ORDER — PANTOPRAZOLE SODIUM 40 MG PO TBEC
40.0000 mg | DELAYED_RELEASE_TABLET | Freq: Every day | ORAL | Status: DC
  Administered 2018-11-25: 40 mg via ORAL
  Filled 2018-11-25: qty 1

## 2018-11-25 MED ORDER — SUGAMMADEX SODIUM 200 MG/2ML IV SOLN
INTRAVENOUS | Status: DC | PRN
  Administered 2018-11-25: 95.2 mg via INTRAVENOUS

## 2018-11-25 MED ORDER — CHLORHEXIDINE GLUCONATE CLOTH 2 % EX PADS: 6.0000 | MEDICATED_PAD | Freq: Once | CUTANEOUS | Status: DC

## 2018-11-25 MED ORDER — PROPOFOL 10 MG/ML IV BOLUS
INTRAVENOUS | Status: DC | PRN
  Administered 2018-11-25: 130 mg via INTRAVENOUS

## 2018-11-25 MED ORDER — CEFAZOLIN SODIUM-DEXTROSE 2-4 GM/100ML-% IV SOLN
2.0000 g | Freq: Three times a day (TID) | INTRAVENOUS | Status: AC
  Administered 2018-11-25 – 2018-11-26 (×2): 2 g via INTRAVENOUS
  Filled 2018-11-25 (×2): qty 100

## 2018-11-25 MED ORDER — LEVOTHYROXINE SODIUM 75 MCG PO TABS
62.5000 ug | ORAL_TABLET | ORAL | Status: DC
  Administered 2018-11-26: 62.5 ug via ORAL
  Filled 2018-11-25: qty 1

## 2018-11-25 MED ORDER — LIDOCAINE-EPINEPHRINE 1 %-1:100000 IJ SOLN
INTRAMUSCULAR | Status: DC | PRN
  Administered 2018-11-25: 5 mL

## 2018-11-25 MED ORDER — MORPHINE SULFATE (PF) 2 MG/ML IV SOLN: 2.0000 mg | INTRAVENOUS | Status: DC | PRN

## 2018-11-25 MED ORDER — SIMVASTATIN 20 MG PO TABS
20.0000 mg | ORAL_TABLET | ORAL | Status: DC
  Administered 2018-11-25: 20 mg via ORAL
  Filled 2018-11-25: qty 1

## 2018-11-25 MED ORDER — PROMETHAZINE HCL 25 MG/ML IJ SOLN: 6.2500 mg | INTRAMUSCULAR | Status: DC | PRN

## 2018-11-25 MED ORDER — SODIUM CHLORIDE 0.9% FLUSH
3.0000 mL | Freq: Two times a day (BID) | INTRAVENOUS | Status: DC
  Administered 2018-11-25: 3 mL via INTRAVENOUS

## 2018-11-25 MED ORDER — THROMBIN 20000 UNITS EX SOLR
CUTANEOUS | Status: DC | PRN
  Administered 2018-11-25: 12:00:00 via TOPICAL

## 2018-11-25 MED ORDER — FLEET ENEMA 7-19 GM/118ML RE ENEM: 1.0000 | ENEMA | Freq: Once | RECTAL | Status: DC | PRN

## 2018-11-25 MED ORDER — PHENYLEPHRINE 40 MCG/ML (10ML) SYRINGE FOR IV PUSH (FOR BLOOD PRESSURE SUPPORT)
PREFILLED_SYRINGE | INTRAVENOUS | Status: DC | PRN
  Administered 2018-11-25: 80 ug via INTRAVENOUS

## 2018-11-25 MED ORDER — POLYETHYLENE GLYCOL 3350 17 G PO PACK: 17.0000 g | PACK | Freq: Every day | ORAL | Status: DC | PRN

## 2018-11-25 MED ORDER — LIDOCAINE 2% (20 MG/ML) 5 ML SYRINGE
INTRAMUSCULAR | Status: DC | PRN
  Administered 2018-11-25: 60 mg via INTRAVENOUS

## 2018-11-25 MED ORDER — CEFAZOLIN SODIUM-DEXTROSE 2-4 GM/100ML-% IV SOLN
2.0000 g | INTRAVENOUS | Status: AC
  Administered 2018-11-25: 2 g via INTRAVENOUS

## 2018-11-25 MED ORDER — LEVOTHYROXINE SODIUM 125 MCG PO TABS: 62.5000 ug | ORAL_TABLET | ORAL | Status: DC

## 2018-11-25 MED ORDER — ONDANSETRON HCL 4 MG PO TABS: 4.0000 mg | ORAL_TABLET | Freq: Four times a day (QID) | ORAL | Status: DC | PRN

## 2018-11-25 MED ORDER — KETOROLAC TROMETHAMINE 15 MG/ML IJ SOLN
7.5000 mg | Freq: Four times a day (QID) | INTRAMUSCULAR | Status: DC
  Administered 2018-11-25 – 2018-11-26 (×3): 7.5 mg via INTRAVENOUS
  Filled 2018-11-25 (×3): qty 1

## 2018-11-25 MED ORDER — BISOPROLOL-HYDROCHLOROTHIAZIDE 2.5-6.25 MG PO TABS
1.0000 | ORAL_TABLET | Freq: Every day | ORAL | Status: DC
  Administered 2018-11-25: 1 via ORAL
  Filled 2018-11-25 (×2): qty 1

## 2018-11-25 MED ORDER — SODIUM CHLORIDE 0.9% FLUSH: 3.0000 mL | INTRAVENOUS | Status: DC | PRN

## 2018-11-25 MED ORDER — 0.9 % SODIUM CHLORIDE (POUR BTL) OPTIME
TOPICAL | Status: DC | PRN
  Administered 2018-11-25 (×3): 1000 mL

## 2018-11-25 MED ORDER — ROCURONIUM BROMIDE 10 MG/ML (PF) SYRINGE
PREFILLED_SYRINGE | INTRAVENOUS | Status: DC | PRN
  Administered 2018-11-25: 50 mg via INTRAVENOUS
  Administered 2018-11-25 (×3): 10 mg via INTRAVENOUS

## 2018-11-25 MED ORDER — ACETAMINOPHEN 650 MG RE SUPP: 650.0000 mg | RECTAL | Status: DC | PRN

## 2018-11-25 SURGICAL SUPPLY — 69 items
ADH SKN CLS APL DERMABOND .7 (GAUZE/BANDAGES/DRESSINGS) ×1
APL SRG 60D 8 XTD TIP BNDBL (TIP)
BAG DECANTER FOR FLEXI CONT (MISCELLANEOUS) ×3 IMPLANT
BASKET BONE COLLECTION (BASKET) ×3 IMPLANT
BLADE CLIPPER SURG (BLADE) IMPLANT
BONE CANC CHIPS 20CC PCAN1/4 (Bone Implant) ×3 IMPLANT
BUR MATCHSTICK NEURO 3.0 LAGG (BURR) ×3 IMPLANT
CANISTER SUCT 3000ML PPV (MISCELLANEOUS) ×3 IMPLANT
CAP LOCKING CREO THRD (Cap) IMPLANT
CEMENT KYPHON C01A KIT/MIXER (Cement) ×2 IMPLANT
CHIPS CANC BONE 20CC PCAN1/4 (Bone Implant) ×1 IMPLANT
CONT SPEC 4OZ CLIKSEAL STRL BL (MISCELLANEOUS) ×3 IMPLANT
COVER BACK TABLE 60X90IN (DRAPES) ×3 IMPLANT
COVER WAND RF STERILE (DRAPES) ×1 IMPLANT
DECANTER SPIKE VIAL GLASS SM (MISCELLANEOUS) ×3 IMPLANT
DERMABOND ADVANCED (GAUZE/BANDAGES/DRESSINGS) ×2
DERMABOND ADVANCED .7 DNX12 (GAUZE/BANDAGES/DRESSINGS) ×1 IMPLANT
DEVICE DISSECT PLASMABLAD 3.0S (MISCELLANEOUS) ×1 IMPLANT
DRAPE C-ARM 42X72 X-RAY (DRAPES) ×4 IMPLANT
DRAPE HALF SHEET 40X57 (DRAPES) ×2 IMPLANT
DRAPE LAPAROTOMY 100X72X124 (DRAPES) ×3 IMPLANT
DRAPE WARM FLUID 44X44 (DRAPE) ×4 IMPLANT
DRIVER SHAFT CREO (MISCELLANEOUS) ×2 IMPLANT
DURAPREP 26ML APPLICATOR (WOUND CARE) ×3 IMPLANT
DURASEAL APPLICATOR TIP (TIP) IMPLANT
DURASEAL SPINE SEALANT 3ML (MISCELLANEOUS) IMPLANT
ELECT REM PT RETURN 9FT ADLT (ELECTROSURGICAL) ×3
ELECTRODE REM PT RTRN 9FT ADLT (ELECTROSURGICAL) ×1 IMPLANT
GAUZE 4X4 16PLY RFD (DISPOSABLE) IMPLANT
GAUZE SPONGE 4X4 12PLY STRL (GAUZE/BANDAGES/DRESSINGS) ×3 IMPLANT
GLOVE BIOGEL PI IND STRL 8.5 (GLOVE) ×2 IMPLANT
GLOVE BIOGEL PI INDICATOR 8.5 (GLOVE) ×4
GLOVE ECLIPSE 8.5 STRL (GLOVE) ×6 IMPLANT
GOWN STRL REUS W/ TWL LRG LVL3 (GOWN DISPOSABLE) IMPLANT
GOWN STRL REUS W/ TWL XL LVL3 (GOWN DISPOSABLE) IMPLANT
GOWN STRL REUS W/TWL 2XL LVL3 (GOWN DISPOSABLE) ×6 IMPLANT
GOWN STRL REUS W/TWL LRG LVL3 (GOWN DISPOSABLE)
GOWN STRL REUS W/TWL XL LVL3 (GOWN DISPOSABLE)
GRAFT BNE CANC CHIPS 1-8 20CC (Bone Implant) IMPLANT
HEMOSTAT POWDER KIT SURGIFOAM (HEMOSTASIS) ×2 IMPLANT
KIT BASIN OR (CUSTOM PROCEDURE TRAY) ×3 IMPLANT
KIT INFUSE SMALL (Orthopedic Implant) ×2 IMPLANT
KIT TURNOVER KIT B (KITS) ×3 IMPLANT
LOCKING CAP CREO THRD (Cap) ×12 IMPLANT
MILL MEDIUM DISP (BLADE) ×3 IMPLANT
MIXER KYPHON (MISCELLANEOUS) ×2 IMPLANT
NEEDLE HYPO 22GX1.5 SAFETY (NEEDLE) ×3 IMPLANT
NS IRRIG 1000ML POUR BTL (IV SOLUTION) ×7 IMPLANT
PACK AFFIRM FILLER DELIVERY (MISCELLANEOUS) ×2 IMPLANT
PACK LAMINECTOMY NEURO (CUSTOM PROCEDURE TRAY) ×3 IMPLANT
PAD ARMBOARD 7.5X6 YLW CONV (MISCELLANEOUS) ×9 IMPLANT
PATTIES SURGICAL .5 X1 (DISPOSABLE) ×3 IMPLANT
PLASMABLADE 3.0S (MISCELLANEOUS) ×3
ROD SPINAL 35MM (Rod) ×4 IMPLANT
SCREW CREO 5.5X45MM POLY (Screw) ×8 IMPLANT
SPACER SUSTAIN RT 10 8 8X22 (Spacer) ×2 IMPLANT
SPACER SUSTAIN RT 2 12 9X22 (Spacer) ×2 IMPLANT
SPONGE LAP 4X18 RFD (DISPOSABLE) IMPLANT
SPONGE SURGIFOAM ABS GEL 100 (HEMOSTASIS) ×3 IMPLANT
SUT PROLENE 6 0 BV (SUTURE) IMPLANT
SUT VIC AB 1 CT1 18XBRD ANBCTR (SUTURE) ×1 IMPLANT
SUT VIC AB 1 CT1 8-18 (SUTURE) ×3
SUT VIC AB 2-0 CP2 18 (SUTURE) ×3 IMPLANT
SUT VIC AB 3-0 SH 8-18 (SUTURE) ×5 IMPLANT
SYR 3ML LL SCALE MARK (SYRINGE) ×12 IMPLANT
TOWEL GREEN STERILE (TOWEL DISPOSABLE) ×3 IMPLANT
TOWEL GREEN STERILE FF (TOWEL DISPOSABLE) ×3 IMPLANT
TRAY FOLEY MTR SLVR 16FR STAT (SET/KITS/TRAYS/PACK) ×3 IMPLANT
WATER STERILE IRR 1000ML POUR (IV SOLUTION) ×3 IMPLANT

## 2018-11-25 NOTE — Progress Notes (Signed)
Patient ID: Debbie Houston, female   DOB: 1941/06/23, 78 y.o.   MRN: [id number] Vital signs are stable Motor function appears good in the lower extremities Patient is resting comfortably with not much back pain at this time Stable postop

## 2018-11-25 NOTE — Anesthesia Postprocedure Evaluation (Signed)
Anesthesia Post Note  Patient: Debbie Houston  Procedure(s) Performed: Lumbar Four-Five Posterior lumbar interbody fusion (N/A Back)     Patient location during evaluation: PACU Anesthesia Type: General Level of consciousness: awake and alert Pain management: pain level controlled Vital Signs Assessment: post-procedure vital signs reviewed and stable Respiratory status: spontaneous breathing, nonlabored ventilation, respiratory function stable and patient connected to nasal cannula oxygen Cardiovascular status: blood pressure returned to baseline and stable Postop Assessment: no apparent nausea or vomiting Anesthetic complications: no    Last Vitals:  Vitals:   11/25/18 1511 11/25/18 1526  BP: 125/79 138/83  Pulse: 79 73  Resp: 15 16  Temp:  36.7 C  SpO2: 99% 98%    Last Pain:  Vitals:   11/25/18 1526  TempSrc:   PainSc: 0-No pain                 [name]

## 2018-11-25 NOTE — Anesthesia Preprocedure Evaluation (Addendum)
Anesthesia Evaluation  Patient identified by MRN, date of birth, ID band Patient awake    Reviewed: Allergy & Precautions, NPO status , Patient's Chart, lab work & pertinent test results, reviewed documented beta blocker date and time   Airway Mallampati: I  TM Distance: >3 FB Neck ROM: Full    Dental  (+) Teeth Intact, Dental Advisory Given   Pulmonary asthma , Current Smoker,    breath sounds clear to auscultation       Cardiovascular hypertension, Pt. on medications and Pt. on home beta blockers  Rhythm:Regular Rate:Normal     Neuro/Psych negative neurological ROS  negative psych ROS   GI/Hepatic Neg liver ROS, GERD  Medicated,  Endo/Other  Hypothyroidism   Renal/GU negative Renal ROS     Musculoskeletal  (+) Arthritis , Osteoarthritis,    Abdominal Normal abdominal exam  (+)   Peds  Hematology   Anesthesia Other Findings   Reproductive/Obstetrics                            Anesthesia Physical Anesthesia Plan  ASA: II  Anesthesia Plan: General   Post-op Pain Management:    Induction: Intravenous  PONV Risk Score and Plan: 3 and Ondansetron, Dexamethasone and Midazolam  Airway Management Planned: Oral ETT  Additional Equipment: None  Intra-op Plan:   Post-operative Plan: Extubation in OR  Informed Consent: I have reviewed the patients History and Physical, chart, labs and discussed the procedure including the risks, benefits and alternatives for the proposed anesthesia with the patient or authorized representative who has indicated his/her understanding and acceptance.   Dental advisory given  Plan Discussed with: CRNA  Anesthesia Plan Comments:        Anesthesia Quick Evaluation

## 2018-11-25 NOTE — Transfer of Care (Signed)
Immediate Anesthesia Transfer of Care Note  Patient: Debbie Houston  Procedure(s) Performed: Lumbar Four-Five Posterior lumbar interbody fusion (N/A Back)  Patient Location: PACU  Anesthesia Type:General  Level of Consciousness: awake, alert  and oriented  Airway & Oxygen Therapy: Patient Spontanous Breathing and Patient connected to nasal cannula oxygen  Post-op Assessment: Report given to RN and Post -op Vital signs reviewed and stable  Post vital signs: Reviewed and stable  Last Vitals:  Vitals Value Taken Time  BP 148/82 11/25/2018  2:56 PM  Temp    Pulse 89 11/25/2018  3:01 PM  Resp 13 11/25/2018  3:01 PM  SpO2 98 % 11/25/2018  3:01 PM  Vitals shown include unvalidated device data.  Last Pain:  Vitals:   11/25/18 0949  TempSrc:   PainSc: 10-Worst pain ever         Complications: No apparent anesthesia complications

## 2018-11-25 NOTE — Anesthesia Procedure Notes (Signed)
Procedure Name: Intubation Date/Time: 11/25/2018 11:01 AM Performed by: Alain Marion, CRNA Pre-anesthesia Checklist: Patient identified, Emergency Drugs available, Suction available and Patient being monitored Patient Re-evaluated:Patient Re-evaluated prior to induction Oxygen Delivery Method: Circle System Utilized Preoxygenation: Pre-oxygenation with 100% oxygen Induction Type: IV induction Ventilation: Mask ventilation without difficulty Laryngoscope Size: Miller and 2 Grade View: Grade I Tube type: Oral Tube size: 7.0 mm Number of attempts: 1 Airway Equipment and Method: Stylet and Oral airway Placement Confirmation: ETT inserted through vocal cords under direct vision,  positive ETCO2 and breath sounds checked- equal and bilateral Secured at: 21 cm Tube secured with: Tape Dental Injury: Teeth and Oropharynx as per pre-operative assessment

## 2018-11-25 NOTE — Op Note (Signed)
Date of surgery: 11/25/2018 Preoperative diagnosis: Spondylolisthesis L4-5 with stenosis and lumbar radiculopathy Postoperative diagnosis: Same Procedure: Laminectomy L4-L5 with decompression the L4 and L5 nerve roots with more work than required for simple interbody technique.  Posterior lumbar interbody arthrodesis using peek spacers local autograft and allograft and infuse.  Pedicle screw fixation L4-L5 with methacrylate augmentation under fluoroscopic guidance L4-L5. Instrumentation used 9 x 22 x 12 mm 15 degree lordotic cage on the right 8 x 22 x 10 mm 8 degree lordotic cage on the left from globus 5.5 x 45 mm screws at L4 and L5 with Creo rod 35 mm in length and methacrylate augmentation small size infuse Surgeon: Kristeen Miss First assistant: Deri Fuelling, MD Anesthesia: General endotracheal Indications: Debbie Houston is a 78 year old individual whose had significant back and bilateral lower extremity pain she has evidence of spondylolisthesis at L4-L5 with high-grade stenosis at that level.  She has had persistent and unrelenting pain for the past several months.  Procedure: Patient was brought to the operating room supine on the stretcher.  After the smooth induction of general endotracheal anesthesia, she was turned prone.  The back was prepped with alcohol DuraPrep and draped in a sterile fashion.  Midline incision was created and carried down to the lumbodorsal fascia which was opened on either side of midline to expose the for spinous processes which were identified as L5 and then L4.  Interlaminar space at L4-L5 was then cleared and self-retaining retractor was placed deep into the wound.  Then after carefully identifying landmarks laminotomies were created removing the inferior margin lamina of L4 out to and including the entirety of the facet at L4-L5.  Thickened redundant yellow ligament was taken up in this region the common dural tube was explored carefully the L4 nerve root was dissected and  decompressed superiorly and the L5 nerve root was decompressed inferiorly the disc space was isolated.  Then by protecting the dura on one side the disc space was entered and a significant quantity of severely degenerated desiccated disc material was removed.  Procedure was repeated on the opposite side and gradually the interspace could be mobilized and a self-retaining retractor could be placed into the disc space.  On the right side there was noted to be a significant Schmorl's node into the body of L5.  This was carefully decorticated and all the cartilaginous attachments were removed.  When it was prepared it was felt that a 12 mm tall 15 degree lordotic spacer would fit well into this right side.  This was not the case on the left side which was narrower and did not have the Schmorl's node deformity on that side after adequate decortication was obtained an 8 x 22 x 10 mm tall 8 degree lordotic cage was placed these were filled with autograft allograft and infuse.  A total of 9 cc of bone graft was packed into the interspace.  Once the cages were placed pedicle entry sites were chosen at L4 and L5.  5.5 x 45 mm pedicle screws were placed in L4 and these were supplemented with 1-1/2 cc of methacrylate augmentation placed under fluoroscopic guidance at L5 pedicle screws were also placed but on the right side most of the cement was noted to exit through the breech in the Schmorl's node on that side.  On the left side there was some extravasation of cement inferiorly and laterally to the vertebral body.  The loss of purchase of the screws was noted be good and after multiple  x-rays confirmation of the decompression was good with good reduction.  The system was tightened and the neutral fashion and the lateral gutters were packed with a total of 6 cc on each side of autograft allograft and infuse.  Hemostasis in the soft tissues was checked meticulously when this was verified the lumbodorsal fascia was closed with  #1 Vicryl in interrupted fashion 2-0 Vicryl was used in subcutaneous tissues and 3-0 Vicryl subcuticularly.  Dermabond was placed on the skin.  Blood loss for the procedure was estimated 300 cc.

## 2018-11-25 NOTE — H&P (Signed)
CHIEF COMPLAINT: Back pain, weakness and numbness.  HISTORY OF PRESENT ILLNESS: [name] is a 78 year old left-handed individual who tells me that she has had some issues with her back that go back for a little over year's period of time.  She has also had some issues with her cervical spine, but she brings with her an MRI of the lumbar spine that was recently performed that demonstrates that she has a grade 1 spondylolisthesis at the level of L4-L5 with a high-grade stenosis of the central canal.  The overall anatomy of her lumbar spine is quite healthy, save for the L4-5 joint.  A year ago, she had some plain x-rays that demonstrated the spondylolisthesis at L4-5 without any evidence of a pars defect.  She has degenerated facets that are notable.  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  Clinically, [name] tells me that she has had centrally low back pain.  She get some dysesthetic radiation into the buttocks and lower extremities and she notes that the symptoms will alternate from leg to leg and sometimes on both sides.  She has been mitigating this by modifying her activity somewhat and she had physical therapy administered a year ago and had some exercises that she has since stopped doing.  She notes that the symptoms tend to come and go and are better and worse from day to day.  Nonetheless, she feels that there has been perhaps some weakness and fatigability that has been occurring in her lower extremities and she is seen now for an opinion as to what to do with this problem.  PAST MEDICAL HISTORY: Reveals that her general health has been excellent.  She notes that there is some borderline hypertension for which she takes some medication on an every-other-day basis.  She has some borderline diabetes, but is only treating this with diet.  She has some colitis that  she notes with some GI disruption too. Her cholesterol has been slightly elevated.  CURRENT MEDICATIONS: Include Simvastatin, Budesonide, Levothyroxine, Bisoprolol, Famotidine, Pantoprazole, in addition to a number of supplements including some vitamins, Co Q10, probiotics.  She has used some fish oil and flaxseed oil and also is using some Flonase for nasal allergies.  SYSTEMS REVIEW: Notable for some night sweats, history of cataracts, some hearing loss, leg pain while walking, high cholesterol, leg weakness, arm pain, neck pain, leg pain, in addition to note of thyroid disease on a 14-point review sheet.  PHYSICAL EXAMINATION: I note that she stands straight and erect.  She walks without antalgia and has good strength in the major groups including the iliopsoas, quads, tibialis anterior, and gastrocs.  Tone and bulk in the lower extremities appear normal.  IMPRESSION: [name] has evidence of a spondylolisthesis with high-grade stenosis at L4-L5.  I reviewed the MRI findings with her today in the office.  I noted that ultimately she will require some surgical intervention for this process.  Interestingly, she has had a minimum of conservative management with a physical therapy program which would be of benefit to continue doing some stretching exercises.  I did suggest that she might benefit from a singular translaminar injection at the level of L4-L5.  If this remediates her symptoms, she may do well for a number of months, possibly even a year or so, but invariably, I believe the stenosis  is going to create worse symptoms for her.  I discussed the surgical intervention for this process with her, noting that she would require laminectomy that is removal of the posterior arch of bone to decompress the nerve centrally, removal then of the disc in its entirety between L4 and L5 and placement of pedicle screws on the left and the right side at L4-L5 for a total of 4 screws and some rods to hold  the vertebrae together and keep them in alignment.  This is a substantial operation.  However, the good news is that she has single-level disease and generally patients with single-level disease seem to do very well from relief of their stenosis. At this time, given her good functional status, I would suggest a trial of the epidural injection.  Ultimately, if this is not successful, she may need to consider strongly the surgical intervention. Having failed efforts at conservative management she is now admitted for surgery.

## 2018-11-25 NOTE — Plan of Care (Signed)
  Problem: Education: Goal: Ability to verbalize activity precautions or restrictions will improve Outcome: Progressing Goal: Knowledge of the prescribed therapeutic regimen will improve Outcome: Progressing Goal: Understanding of discharge needs will improve Outcome: Progressing   Problem: Activity: Goal: Ability to avoid complications of mobility impairment will improve Outcome: Progressing Goal: Ability to tolerate increased activity will improve Outcome: Progressing Goal: Will remain free from falls Outcome: Progressing   Problem: Bowel/Gastric: Goal: Gastrointestinal status for postoperative course will improve Outcome: Progressing   Problem: Pain Management: Goal: Pain level will decrease Outcome: Progressing   Problem: Bladder/Genitourinary: Goal: Urinary functional status for postoperative course will improve Outcome: Progressing   Problem: Safety: Goal: Ability to remain free from injury will improve Outcome: Progressing

## 2018-11-26 LAB — CBC
HCT: 31.5 % — ABNORMAL LOW (ref 36.0–46.0)
Hemoglobin: 10.3 g/dL — ABNORMAL LOW (ref 12.0–15.0)
MCH: 31.3 pg (ref 26.0–34.0)
MCHC: 32.7 g/dL (ref 30.0–36.0)
MCV: 95.7 fL (ref 80.0–100.0)
NRBC: 0 % (ref 0.0–0.2)
Platelets: 190 10*3/uL (ref 150–400)
RBC: 3.29 MIL/uL — AB (ref 3.87–5.11)
RDW: 13.3 % (ref 11.5–15.5)
WBC: 10.5 10*3/uL (ref 4.0–10.5)

## 2018-11-26 LAB — BASIC METABOLIC PANEL
ANION GAP: 6 (ref 5–15)
BUN: 9 mg/dL (ref 8–23)
CO2: 26 mmol/L (ref 22–32)
Calcium: 8.7 mg/dL — ABNORMAL LOW (ref 8.9–10.3)
Chloride: 104 mmol/L (ref 98–111)
Creatinine, Ser: 0.84 mg/dL (ref 0.44–1.00)
GFR calc Af Amer: >60 mL/min (ref >60)
GFR calc non Af Amer: >60 mL/min (ref >60)
Glucose, Bld: 93 mg/dL (ref 70–99)
POTASSIUM: 3.7 mmol/L (ref 3.5–5.1)
Sodium: 136 mmol/L (ref 135–145)

## 2018-11-26 MED ORDER — OXYCODONE-ACETAMINOPHEN 5-325 MG PO TABS: 1.0000 | ORAL_TABLET | ORAL | 0 refills | Status: DC | PRN

## 2018-11-26 MED ORDER — METHOCARBAMOL 500 MG PO TABS: 500.0000 mg | ORAL_TABLET | Freq: Four times a day (QID) | ORAL | 3 refills | Status: DC | PRN

## 2018-11-26 MED ORDER — DEXAMETHASONE 1 MG PO TABS: ORAL_TABLET | ORAL | 0 refills | Status: DC

## 2018-11-26 NOTE — Evaluation (Addendum)
Occupational Therapy Evaluation and Discharge Patient Details Name: Debbie Houston MRN: [id number] DOB: 03/15/1941 Today's Date: 11/26/2018    History of Present Illness Patient is a 78 y/o female s/p L 4-5 laminectomy and decompression.  PMH: arthritis, Benign labile hypertension, Cancer Osteopenia.   Clinical Impression   Patient admitted for above, PTA independent with ADLs/mobility.  Currently requires supervision for self care, transfers and mobility for safety and precaution adherence, no physical assist required.  Patient educated on back precautions, brace mgmt and wearing schedule, mobility, safety, AE/DME recommendations, ADl compensatory techniques, and energy conservation.  Patient and spouse verbalize understanding of all education and recommendation, they have access to 3:1 commode, shower chair and reacher.  Patient will have spouses support at discharge.  At this time, no further OT needs have been identified.  OT signing off. Thank you for this referral.     Follow Up Recommendations  No OT follow up;Supervision - Intermittent    Equipment Recommendations  None recommended by OT    Recommendations for Other Services PT consult     Precautions / Restrictions Precautions Precautions: Back Precaution Booklet Issued: Yes (comment) Precaution Comments: reviewed with pt and spouse, requires cueing to adhere during self care Required Braces or Orthoses: Spinal Brace Spinal Brace: Lumbar corset;Applied in sitting position Restrictions Weight Bearing Restrictions: No      Mobility Bed Mobility Overal bed mobility: Needs Assistance Bed Mobility: Rolling;Sidelying to Sit;Sit to Sidelying Rolling: Supervision Sidelying to sit: Supervision     Sit to sidelying: Supervision General bed mobility comments: simulated with HOB at 0 degrees and no rails, supervision for safety  Transfers Overall transfer level: Needs assistance Equipment used: None Transfers: Sit to/from  Stand Sit to Stand: Supervision         General transfer comment: supervision for safety    Balance Overall balance assessment: Mild deficits observed, not formally tested                                         ADL either performed or assessed with clinical judgement   ADL Overall ADL's : Needs assistance/impaired     Grooming: Supervision/safety;Standing   Upper Body Bathing: Supervision/ safety;Sitting   Lower Body Bathing: Supervison/ safety;Sitting/lateral leans   Upper Body Dressing : Supervision/safety;Sitting   Lower Body Dressing: Supervision/safety;Sit to/from stand   Toilet Transfer: Supervision/safety;Ambulation Toilet Transfer Details (indicate cue type and reason): simulated in room Toileting- Clothing Manipulation and Hygiene: Supervision/safety;Sit to/from stand;Sitting/lateral lean   Tub/ Shower Transfer: Tub transfer;Min guard;Ambulation;Shower Scientist, research (medical) Details (indicate cue type and reason): simulated in room Functional mobility during ADLs: Supervision/safety General ADL Comments: Patient educated on precautions, safety, body mechanics, AE, compensatory techniques for self care.  Able to verbalize understanding.  Able to complete figure 4 techniques for LB self care, shower/bathing techniques and brace mgmt with supervision.     Vision Baseline Vision/History: Wears glasses Wears Glasses: Reading only Patient Visual Report: No change from baseline Vision Assessment?: No apparent visual deficits     Perception     Praxis      Pertinent Vitals/Pain Pain Assessment: Faces Faces Pain Scale: Hurts a little bit Pain Location: incision site Pain Descriptors / Indicators: Discomfort;Operative site guarding Pain Intervention(s): Limited activity within patient's tolerance;Repositioned     Hand Dominance Left   Extremity/Trunk Assessment Upper Extremity Assessment Upper Extremity Assessment: Overall WFL for tasks  assessed  Lower Extremity Assessment Lower Extremity Assessment: Defer to PT evaluation   Cervical / Trunk Assessment Cervical / Trunk Assessment: Other exceptions Cervical / Trunk Exceptions: s/p back surgery   Communication Communication Communication: No difficulties   Cognition Arousal/Alertness: Awake/alert Behavior During Therapy: WFL for tasks assessed/performed Overall Cognitive Status: Within Functional Limits for tasks assessed                                 General Comments: pt reports hx of ADD   General Comments  spouse present and supportive, reviewed energy conservation and modified techniques for IADLs (cooking)     Exercises     Shoulder Instructions      Home Living Family/patient expects to be discharged to:: Private residence Living Arrangements: Spouse/significant other;Children Available Help at Discharge: Family Type of Home: House Home Access: Stairs to enter Technical brewer of Steps: 2-3 Entrance Stairs-Rails: None Home Layout: One level     Bathroom Shower/Tub: Tub/shower unit;Walk-in shower   Bathroom Toilet: Standard     Home Equipment: Environmental consultant - 2 wheels;Bedside commode;Shower seat;Adaptive equipment Adaptive Equipment: Reacher        Prior Functioning/Environment Level of Independence: Independent                 OT Problem List: Decreased activity tolerance;Impaired balance (sitting and/or standing);Decreased safety awareness;Decreased knowledge of use of DME or AE;Decreased knowledge of precautions;Pain      OT Treatment/Interventions:      OT Goals(Current goals can be found in the care plan section) Acute Rehab OT Goals Patient Stated Goal: to get home  OT Goal Formulation: With patient  OT Frequency:     Barriers to D/C:            Co-evaluation              AM-PAC OT "6 Clicks" Daily Activity     Outcome Measure Help from another person eating meals?: None Help from another  person taking care of personal grooming?: None Help from another person toileting, which includes using toliet, bedpan, or urinal?: None Help from another person bathing (including washing, rinsing, drying)?: None Help from another person to put on and taking off regular upper body clothing?: None Help from another person to put on and taking off regular lower body clothing?: None 6 Click Score: 24   End of Session Equipment Utilized During Treatment: Back brace Nurse Communication: Mobility status  Activity Tolerance: Patient tolerated treatment well Patient left: with call bell/phone within reach;with family/visitor present;Other (comment)(seated EOB)  OT Visit Diagnosis: Other abnormalities of gait and mobility (R26.89);Pain Pain - part of body: (back)                Time: 8889-1694 OT Time Calculation (min): 21 min Charges:  OT General Charges $OT Visit: 1 Visit OT Evaluation $OT Eval Low Complexity: 1 Low  Delight Stare, OT Acute Rehabilitation Services Pager [phone] Office [phone]   Delight Stare 11/26/2018, 8:26 AM

## 2018-11-26 NOTE — Evaluation (Signed)
Physical Therapy Evaluation and Discharge Patient Details Name: Debbie Houston MRN: [id number] DOB: [birth year] Today's Date: 11/26/2018   History of Present Illness  Patient is a 78 y/o female s/p L 4-5 laminectomy and decompression.  PMH: arthritis, Benign labile hypertension, Cancer Osteopenia.  Clinical Impression  Patient evaluated by Physical Therapy with no further acute PT needs identified. All education has been completed and the patient has no further questions. At the time of PT eval pt was able to perform transfers and ambulation with gross modified independence without an AD. Pt was educated on precautions, brace application/wearing schedule, safe activity progression, and car transfer. See below for any follow-up Physical Therapy or equipment needs. PT is signing off. Thank you for this referral.     Follow Up Recommendations No PT follow up;Supervision - Intermittent    Equipment Recommendations  None recommended by PT    Recommendations for Other Services       Precautions / Restrictions Precautions Precautions: Back Precaution Booklet Issued: Yes (comment) Precaution Comments: reviewed with pt and spouse, requires cueing to adhere during self care Required Braces or Orthoses: Spinal Brace Spinal Brace: Lumbar corset;Applied in sitting position Restrictions Weight Bearing Restrictions: No      Mobility  Bed Mobility Overal bed mobility: Needs Assistance Bed Mobility: Rolling;Sidelying to Sit;Sit to Sidelying Rolling: Supervision Sidelying to sit: Supervision     Sit to sidelying: Supervision General bed mobility comments: Pt was received ambulating in room  Transfers Overall transfer level: Modified independent Equipment used: None Transfers: Sit to/from Stand Sit to Stand: Modified independent (Device/Increase time)         General transfer comment: No assist required. VC's to keep from bending.   Ambulation/Gait Ambulation/Gait assistance: Modified  independent (Device/Increase time) Gait Distance (Feet): 400 Feet Assistive device: None Gait Pattern/deviations: Step-through pattern;Decreased stride length;Trunk flexed Gait velocity: Decreased Gait velocity interpretation: >2.62 ft/sec, indicative of community ambulatory General Gait Details: VC's for improved posture and general safety with ambulation. No unsteadiness or LOB noted.   Stairs Stairs: Yes Stairs assistance: Modified independent (Device/Increase time) Stair Management: No rails;Alternating pattern;Forwards Number of Stairs: 10 General stair comments: One minor instance of unsteadiness but no assist required to recover  Wheelchair Mobility    Modified Rankin (Stroke Patients Only)       Balance Overall balance assessment: Mild deficits observed, not formally tested                                           Pertinent Vitals/Pain Pain Assessment: Faces Faces Pain Scale: Hurts a little bit Pain Location: incision site Pain Descriptors / Indicators: Operative site guarding Pain Intervention(s): Monitored during session    Home Living Family/patient expects to be discharged to:: Private residence Living Arrangements: Spouse/significant other;Children Available Help at Discharge: Family Type of Home: House Home Access: Stairs to enter Entrance Stairs-Rails: None Entrance Stairs-Number of Steps: 2-3 Home Layout: One level Home Equipment: Environmental consultant - 2 wheels;Bedside commode;Shower seat;Adaptive equipment      Prior Function Level of Independence: Independent               Hand Dominance   Dominant Hand: Left    Extremity/Trunk Assessment   Upper Extremity Assessment Upper Extremity Assessment: Overall WFL for tasks assessed    Lower Extremity Assessment Lower Extremity Assessment: Overall WFL for tasks assessed    Cervical / Trunk Assessment Cervical /  Trunk Assessment: Other exceptions Cervical / Trunk Exceptions: s/p  back surgery  Communication   Communication: No difficulties  Cognition Arousal/Alertness: Awake/alert Behavior During Therapy: WFL for tasks assessed/performed Overall Cognitive Status: Within Functional Limits for tasks assessed                                 General Comments: pt reports hx of ADD      General Comments General comments (skin integrity, edema, etc.): spouse present and supportive, reviewed energy conservation and modified techniques for IADLs (cooking)     Exercises     Assessment/Plan    PT Assessment Patent does not need any further PT services  PT Problem List         PT Treatment Interventions      PT Goals (Current goals can be found in the Care Plan section)  Acute Rehab PT Goals Patient Stated Goal: to get home  PT Goal Formulation: All assessment and education complete, DC therapy    Frequency     Barriers to discharge        Co-evaluation               AM-PAC PT "6 Clicks" Mobility  Outcome Measure Help needed turning from your back to your side while in a flat bed without using bedrails?: None Help needed moving from lying on your back to sitting on the side of a flat bed without using bedrails?: None Help needed moving to and from a bed to a chair (including a wheelchair)?: None Help needed standing up from a chair using your arms (e.g., wheelchair or bedside chair)?: None Help needed to walk in hospital room?: None Help needed climbing 3-5 steps with a railing? : None 6 Click Score: 24    End of Session Equipment Utilized During Treatment: Gait belt;Back brace Activity Tolerance: Patient tolerated treatment well Patient left: with call bell/phone within reach;with family/visitor present(Sitting EOB) Nurse Communication: Mobility status PT Visit Diagnosis: Pain;Other symptoms and signs involving the nervous system (R29.898) Pain - part of body: (back)    Time: 4166-0630 PT Time Calculation (min) (ACUTE ONLY):  13 min   Charges:   PT Evaluation $PT Eval Low Complexity: 1 Low          Rolinda Roan, PT, DPT Acute Rehabilitation Services Pager: [phone] Office: [phone]   Thelma Comp 11/26/2018, 11:25 AM

## 2018-11-26 NOTE — Discharge Instructions (Signed)
Wound Care Leave incision open to air. You may shower. Do not scrub directly on incision.  Do not put any creams, lotions, or ointments on incision. Activity Walk each and every day, increasing distance each day. No lifting greater than 5 lbs.  Avoid excessive neck motion. No driving for 2 weeks; may ride as a passenger locally. Wear neck brace at all times except when showering.  If provided soft collar, may wear for comfort unless otherwise instructed. Diet Resume your normal diet.  Return to Work Will be discussed at you follow up appointment. Call Your Doctor If Any of These Occur Redness, drainage, or swelling at the wound.  Temperature greater than 101 degrees. Severe pain not relieved by pain medication. Increased difficulty swallowing. Incision starts to come apart. Follow Up Appt Call today for appointment in 4 weeks ([phone]) or for problems.  If you have any hardware placed in your spine, you will need an x-ray before your appointment.   Spinal Fusion, Adult, Care After This sheet gives you information about how to care for yourself after your procedure. Your doctor may also give you more specific instructions. If you have problems or questions, contact your doctor. Follow these instructions at home: Medicines  Take over-the-counter and prescription medicines only as told by your doctor. These include any medicines for pain or blood-thinning medicines (anticoagulants).  If you were prescribed an antibiotic medicine, take it as told by your doctor. Do not stop taking the antibiotic even if you start to feel better.  Do not drive for 24 hours if you were given a medicine to help you relax (sedative) during your procedure.  Do not drive or use heavy machinery while taking prescription pain medicine. If you have a brace:  Wear the brace as told by your doctor. Take it off only as told by your doctor.  Keep the brace clean. Managing pain, stiffness, and swelling  If  directed, put ice on the surgery area: ? If you have a removable brace, take it off as told by your doctor. ? Put ice in a plastic bag. ? Place a towel between your skin and the bag. ? Leave the ice on for 20 minutes, 2-3 times a day. Surgery cut care   Follow instructions from your doctor about how to take care of your cut from surgery (incision). Make sure you: ? Wash your hands with soap and water before you change your bandage (dressing). If you cannot use soap and water, use hand sanitizer. ? Change your bandage as told by your doctor. ? Leave stitches (sutures), skin glue, or skin tape (adhesive) strips in place. They may need to stay in place for 2 weeks or longer. If tape strips get loose and curl up, you may trim the loose edges. Do not remove tape strips completely unless your doctor says it is okay.  Keep your cut from surgery clean and dry. ? Do not take baths, swim, or use a hot tub until your doctor says it is okay. ? Ask your doctor if you can take showers. You may only be allowed to take sponge baths.  Every day, check your cut from surgery and the area around it for: ? More redness, swelling, or pain. ? Fluid or blood. ? Warmth. ? Pus or a bad smell.  If you have a drain tube, follow instructions from your doctor about caring for it. Do not take out the drain tube or any bandages unless your doctor says it is okay.  Physical activity  Rest and protect your back as much as possible.  Follow instructions from your doctor about how to move. Use good posture to help your spine heal.  Do not lift anything that is heavier than 8 lb (3.6 kg), or the limit that you are told, until your doctor says that it is safe.  Do not twist or bend at the waist until your doctor says it is okay.  It is best if you: ? Do not make pushing and pulling motions. ? Do not sit or lie down in the same position for a long time. ? Do not raise your hands or arms above your head.  Return to  your normal activities as told by your doctor. Ask your doctor what activities are safe for you. Rest and protect your back as much as you can.  Do not start to exercise until your doctor says it is okay. Ask your doctor what kinds of exercise you can do to make your back stronger. General instructions  To prevent blood clots and lessen swelling in your legs: ? Wear compression stockings as told. ? Walk one or more times every few hours as told by your doctor.  Do not use any products that contain nicotine or tobacco, such as cigarettes and e-cigarettes. These can delay bone healing. If you need help quitting, ask your doctor.  To prevent or treat constipation while you are taking prescription pain medicine, your doctor may suggest that you: ? Drink enough fluid to keep your pee (urine) pale yellow. ? Take over-the-counter or prescription medicines. ? Eat foods that are high in fiber. These include fresh fruits and vegetables, whole grains, and beans. ? Limit foods that are high in fat and processed sugars, such as fried and sweet foods.  Keep all follow-up visits as told by your doctor. This is important. Contact a doctor if:  Your pain gets worse.  Your medicine does not help your pain.  Your legs or feet get painful or swollen.  Your cut from surgery is more red, swollen, or painful.  Your cut from surgery feels warm to the touch.  You have: ? Fluid or blood coming from your cut from surgery. ? Pus or a bad smell coming from your cut from surgery. ? A fever. ? Weakness or loss of feeling (numbness) in your legs that is new or getting worse. ? Trouble controlling when you pee (urinate) or poop (have a bowel movement).  You feel sick to your stomach (nauseous).  You throw up (vomit). Get help right away if:  Your pain is very bad.  You have chest pain.  You have trouble breathing.  You start to have a cough. These symptoms may be an emergency. Do not wait to see if  the symptoms will go away. Get medical help right away. Call your local emergency services (911 in the [place] Do not drive yourself to the hospital. Summary  After the procedure, it is common to have pain in your back and pain by your surgery cut(s).  Icing and pain medicines may help to control the pain. Follow directions from your doctor.  Rest and protect your back as much as possible. Do not twist or bend at the waist.  Get up and walk one or more times every few hours as told by your doctor. This information is not intended to replace advice given to you by your health care provider. Make sure you discuss any questions you have with your  health care provider. Document Released: 03/01/2011 Document Revised: 02/19/2017 Document Reviewed: 02/19/2017 Elsevier Interactive Patient Education  2019 [employer].

## 2018-11-26 NOTE — Progress Notes (Signed)
Patient ID: Debbie Houston, female   DOB: 1941/04/06, 78 y.o.   MRN: [id number] Vital signs are stable Incisions clean and dry small spot of bleedthrough Motor function is good We will discharge today

## 2018-11-26 NOTE — Discharge Summary (Signed)
Physician Discharge Summary  Patient ID: Debbie Houston MRN: [id number] DOB/AGE: 1940-12-23 78 y.o.  Admit date: 11/25/2018 Discharge date: 11/26/2018  Admission Diagnoses: Spondylolisthesis L4-L5 with lumbar radiculopathy, neurogenic claudication  Discharge Diagnoses: Spondylolisthesis L4-L5 with lumbar radiculopathy, neurogenic claudication Active Problems:   Spondylolisthesis at L4-L5 level   Discharged Condition: good  Hospital Course: Patient was admitted to undergo surgical decompression and stabilization at L4-L5 which she tolerated well.  Consults: None  Significant Diagnostic Studies: None  Treatments: surgery: Lumbar laminectomy L4-L5 posterior lumbar interbody decompression and arthrodesis using peek spacers local autograft allograft pedicle screw fixation L4-L5 with augmentation using methacrylate, infuse  Discharge Exam: Blood pressure 109/69, pulse 64, temperature 97.8 F (36.6 C), temperature source Oral, resp. rate 16, height 5' 5"  (1.651 m), weight 47.6 kg, SpO2 100 %. Incision is clean and dry motor function is intact Station and gait are intact  Disposition: Discharge disposition: 01-Home or Self Care       Discharge Instructions    Call MD for:  redness, tenderness, or signs of infection (pain, swelling, redness, odor or green/yellow discharge around incision site)   Complete by:  As directed    Call MD for:  severe uncontrolled pain   Complete by:  As directed    Call MD for:  temperature >100.4   Complete by:  As directed    Diet - low sodium heart healthy   Complete by:  As directed    Discharge instructions   Complete by:  As directed    Okay to shower. Do not apply salves or appointments to incision. No heavy lifting with the upper extremities greater than 15 pounds. May resume driving when not requiring pain medication and patient feels comfortable with doing so.   Incentive spirometry RT   Complete by:  As directed    Increase activity slowly    Complete by:  As directed      Allergies as of 11/26/2018      Reactions   Fosamax [alendronate Sodium] Other (See Comments)   Reaction:  GI upset    Singulair [montelukast Sodium] Other (See Comments)   Reaction:  Makes pt jittery   Wellbutrin [bupropion] Hives   Sulfa Antibiotics Rash      Medication List    TAKE these medications   acetaminophen 500 MG tablet Commonly known as:  TYLENOL Take 500 mg by mouth every 6 (six) hours as needed.   aspirin EC 81 MG tablet Take 81 mg by mouth daily.   bisoprolol-hydrochlorothiazide 2.5-6.25 MG tablet Commonly known as:  ZIAC Take 1 tablet daily for BP   budesonide 3 MG 24 hr capsule Commonly known as:  ENTOCORT EC Take 3 mg by mouth daily.   CALCIUM 600+D HIGH POTENCY 600-400 MG-UNIT tablet Generic drug:  Calcium Carbonate-Vitamin D Take 1 tablet by mouth daily.   Cinnamon 500 MG Tabs Take 1,000 mg by mouth daily.   CoQ10 200 MG Caps Take 200 mg by mouth daily.   dexamethasone 1 MG tablet Commonly known as:  DECADRON 2 tablets twice daily for 2 days, one tablet twice daily for 2 days, one tablet daily for 2 days.   famotidine 40 MG tablet Commonly known as:  PEPCID TAKE 1 TABLET(40 MG) BY MOUTH EVERY EVENING   Fish Oil 1000 MG Caps Take 1,000 mg by mouth daily.   Flax Seed Oil 1000 MG Caps Take 1,000 mg by mouth daily.   gabapentin 100 MG capsule Commonly known as:  NEURONTIN 1-3 at  night for pain   HAIR/SKIN/NAILS PO Take 1 tablet by mouth daily.   levothyroxine 125 MCG tablet Commonly known as:  SYNTHROID, LEVOTHROID TAKE 1 TABLET BY MOUTH DAILY BEFORE BREAKFAST What changed:  See the new instructions.   methocarbamol 500 MG tablet Commonly known as:  ROBAXIN Take 1 tablet (500 mg total) by mouth every 6 (six) hours as needed for muscle spasms.   multivitamin with minerals tablet Take 1 tablet by mouth daily.   oxyCODONE-acetaminophen 5-325 MG tablet Commonly known as:  PERCOCET/ROXICET Take 1-2  tablets by mouth every 3 (three) hours as needed for moderate pain or severe pain.   pantoprazole 40 MG tablet Commonly known as:  PROTONIX TAKE 1 TABLET(40 MG) BY MOUTH DAILY What changed:  See the new instructions.   PROBIOTIC PO Take 1 capsule by mouth daily.   simvastatin 20 MG tablet Commonly known as:  ZOCOR TAKE 1 TABLET BY MOUTH DAILY What changed:  when to take this   SYSTANE 0.4-0.3 % Soln Generic drug:  Polyethyl Glycol-Propyl Glycol Place 1 drop into both eyes 2 (two) times daily as needed (for dry eyes).   traMADol 50 MG tablet Commonly known as:  ULTRAM Take 50 mg by mouth every 6 (six) hours as needed for moderate pain.   vitamin C 1000 MG tablet Take 1,000 mg by mouth daily.        Signed: Blanchie Dessert Debbie Houston 11/26/2018, 9:20 AM

## 2018-11-26 NOTE — Progress Notes (Signed)
Pt doing well. Pt and husband given D/C instructions with verbal understanding. Pt's Rx's were sent to pharmacy. Pt's incision is clean and dry with no sign of infection. Pt's IV was removed prior to D/C. Pt D/C'd home via walking @ 1030 per MD order. Pt is stable @ D/C and has no other needs at this time. Holli Humbles, RN

## 2018-12-08 ENCOUNTER — Other Ambulatory Visit: Payer: Self-pay | Admitting: Adult Health

## 2018-12-08 DIAGNOSIS — K2100 Gastro-esophageal reflux disease with esophagitis, without bleeding: Secondary | ICD-10-CM

## 2018-12-10 ENCOUNTER — Encounter (HOSPITAL_COMMUNITY): Payer: Self-pay | Admitting: Neurological Surgery

## 2018-12-10 DIAGNOSIS — M48062 Spinal stenosis, lumbar region with neurogenic claudication: Secondary | ICD-10-CM | POA: Diagnosis not present

## 2018-12-11 ENCOUNTER — Encounter (HOSPITAL_COMMUNITY): Payer: Self-pay | Admitting: Neurological Surgery

## 2018-12-15 NOTE — Patient Instructions (Addendum)
Ms. Alred , Thank you for taking time to come for your Medicare Wellness Visit. I appreciate your ongoing commitment to your health goals. Please review the following plan we discussed and let me know if I can assist you in the future.    This is a list of the screening recommended for you and due dates:  Health Maintenance  Topic Date Due  . Flu Shot  06/19/2018  . Mammogram  12/02/2018  . Tetanus Vaccine  08/21/2021  . DEXA scan (bone density measurement)  Completed  . 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  You have use tylenol for discomfort.  Do not take more the 3,052m in 24hour period.  The Robaxin 5025mprescribed for muscle spasms may help with the right side of your neck.  Try ice and heat rotating.  Also try massaging the area to help loosen the muscles.  We are checking a urine sample today and this will result in 3 days.  We will contact you via MyChart.  Continue to stay active and increase your water intake.  Aim for 80oz of water a day.   We Do NOT Approve of  [facility] [employer] [facility] Patients  To Do Home Visits & We Do NOT Approve of [employer] > > > > > > > > > > > > > > > > > > > > > > > > > > > > > > > > > > > > > > >  Preventive Care for Adults  A healthy lifestyle and preventive care can promote health and wellness. Preventive health guidelines for women include the following key practices.  A routine yearly physical is a good way to check with your health care provider about your health and preventive screening. It is a chance to share any concerns and updates on your health and to receive a thorough exam.  Visit your dentist for a routine exam and preventive care every 6 months. Brush your teeth twice a day and floss once a day. Good oral hygiene prevents tooth decay and gum disease.  The frequency of eye exams is based on your age,  health, family medical history, use of contact lenses, and other factors. Follow your health care provider's recommendations for frequency of eye exams.  Eat a healthy diet. Foods like vegetables, fruits, whole grains, low-fat dairy products, and lean protein foods contain the nutrients you need without too many calories. Decrease your intake of foods high in solid fats, added sugars, and salt. Eat the right amount of calories for you. Get information about a proper diet from your health care provider, if necessary.  Regular physical exercise is one of the most important things you can do for your health. Most adults should get at least 150 minutes of moderate-intensity exercise (any activity that increases your heart rate and causes you to sweat) each week. In addition, most adults need muscle-strengthening exercises on 2 or more days a week.  Maintain a healthy weight. The body mass index (BMI) is a screening tool to identify possible weight problems. It provides an estimate of body fat based on height and weight. Your health care provider can find your BMI and can help you achieve or maintain a healthy weight. For adults 20 years and older:  A BMI below 18.5 is considered underweight.  A BMI of 18.5 to 24.9 is normal.  A BMI of 25 to 29.9 is considered overweight.  A BMI of 30 and above is considered obese.  Maintain normal blood lipids and cholesterol levels by exercising and minimizing your intake of saturated fat. Eat a balanced diet with plenty of fruit and vegetables. If your lipid or cholesterol levels are high, you are over 50, or you are at high risk for heart disease, you may need your cholesterol levels checked more frequently. Ongoing high lipid and cholesterol levels should be treated with medicines if diet and exercise are not working.  If you smoke, find out from your health care provider how to quit. If you do not use tobacco, do not start.  Lung cancer screening is recommended  for adults aged 28-80 years who are at high risk for developing lung cancer because of a history of smoking. A yearly low-dose CT scan of the lungs is recommended for people who have at least a 30-pack-year history of smoking and are a current smoker or have quit within the past 15 years. A pack year of smoking is smoking an average of 1 pack of cigarettes a day for 1 year (for example: 1 pack a day for 30 years or 2 packs a day for 15 years). Yearly screening should continue until the smoker has stopped smoking for at least 15 years. Yearly screening should be stopped for people who develop a health problem that would prevent them from having lung cancer treatment.  Avoid use of street drugs. Do not share needles with anyone. Ask for help if you need support or instructions about stopping the use of drugs.  High blood pressure causes heart disease and increases the risk of stroke.  Ongoing high blood pressure should be treated with medicines if weight loss and exercise do not work.  If you are 78-44 years old, ask your health care provider if you should take aspirin to prevent strokes.  Diabetes screening involves taking a blood sample to check your fasting blood sugar level. This should be done once every 3 years, after age 50, if you are within normal weight and without risk factors for diabetes. Testing should be considered at a younger age or be carried out more frequently if you are overweight and have at least 1 risk factor for diabetes.  Breast cancer screening is essential preventive care for women. You should practice "breast self-awareness." This means understanding the normal appearance and feel of your breasts and may include breast self-examination. Any changes detected, no matter how small, should be reported to a health care provider. Women in their 78s and 30s should have a clinical breast exam (CBE) by a health care provider as part of a regular health exam every 1 to 3 years. After age  78, women should have a CBE every year. Starting at age 78, women should consider having a mammogram (breast X-ray test) every year. Women who have a family history of breast cancer should talk to their health care provider about genetic screening. Women at a high risk of breast cancer should talk to their health care providers about having an MRI and a mammogram every year.  Breast cancer gene (BRCA)-related cancer risk assessment is recommended for women who have family members with BRCA-related cancers. BRCA-related cancers include breast, ovarian, tubal, and peritoneal cancers. Having family members with these cancers may be associated with an increased risk for harmful changes (mutations) in the breast cancer genes BRCA1 and BRCA2. Results of the assessment will determine the need for  genetic counseling and BRCA1 and BRCA2 testing.  Routine pelvic exams to screen for cancer are no longer recommended for nonpregnant women who are considered low risk for cancer of the pelvic organs (ovaries, uterus, and vagina) and who do not have symptoms. Ask your health care provider if a screening pelvic exam is right for you.  If you have had past treatment for cervical cancer or a condition that could lead to cancer, you need Pap tests and screening for cancer for at least 20 years after your treatment. If Pap tests have been discontinued, your risk factors (such as having a new sexual partner) need to be reassessed to determine if screening should be resumed. Some women have medical problems that increase the chance of getting cervical cancer. In these cases, your health care provider may recommend more frequent screening and Pap tests.    Colorectal cancer can be detected and often prevented. Most routine colorectal cancer screening begins at the age of 74 years and continues through age 76 years. However, your health care provider may recommend screening at an earlier age if you have risk factors for colon  cancer. On a yearly basis, your health care provider may provide home test kits to check for hidden blood in the stool. Use of a small camera at the end of a tube, to directly examine the colon (sigmoidoscopy or colonoscopy), can detect the earliest forms of colorectal cancer. Talk to your health care provider about this at age 26, when routine screening begins.  Direct exam of the colon should be repeated every 5-10 years through age 58 years, unless early forms of pre-cancerous polyps or small growths are found.  Osteoporosis is a disease in which the bones lose minerals and strength with aging. This can result in serious bone fractures or breaks. The risk of osteoporosis can be identified using a bone density scan. Women ages 71 years and over and women at risk for fractures or osteoporosis should discuss screening with their health care providers. Ask your health care provider whether you should take a calcium supplement or vitamin D to reduce the rate of osteoporosis.  Menopause can be associated with physical symptoms and risks. Hormone replacement therapy is available to decrease symptoms and risks. You should talk to your health care provider about whether hormone replacement therapy is right for you.  Use sunscreen. Apply sunscreen liberally and repeatedly throughout the day. You should seek shade when your shadow is shorter than you. Protect yourself by wearing long sleeves, pants, a wide-brimmed hat, and sunglasses year round, whenever you are outdoors.  Once a month, do a whole body skin exam, using a mirror to look at the skin on your back. Tell your health care provider of new moles, moles that have irregular borders, moles that are larger than a pencil eraser, or moles that have changed in shape or color.  Stay current with required vaccines (immunizations).  Influenza vaccine. All adults should be immunized every year.  Tetanus, diphtheria, and acellular pertussis (Td, Tdap) vaccine.  Pregnant women should receive 1 dose of Tdap vaccine during each pregnancy. The dose should be obtained regardless of the length of time since the last dose. Immunization is preferred during the 27th-36th week of gestation. An adult who has not previously received Tdap or who does not know her vaccine status should receive 1 dose of Tdap. This initial dose should be followed by tetanus and diphtheria toxoids (Td) booster doses every 10 years. Adults with an unknown or  incomplete history of completing a 3-dose immunization series with Td-containing vaccines should begin or complete a primary immunization series including a Tdap dose. Adults should receive a Td booster every 10 years.    Zoster vaccine. One dose is recommended for adults aged 26 years or older unless certain conditions are present.    Pneumococcal 13-valent conjugate (PCV13) vaccine. When indicated, a person who is uncertain of her immunization history and has no record of immunization should receive the PCV13 vaccine. An adult aged 64 years or older who has certain medical conditions and has not been previously immunized should receive 1 dose of PCV13 vaccine. This PCV13 should be followed with a dose of pneumococcal polysaccharide (PPSV23) vaccine. The PPSV23 vaccine dose should be obtained at least 1 or more year(s) after the dose of PCV13 vaccine. An adult aged 26 years or older who has certain medical conditions and previously received 1 or more doses of PPSV23 vaccine should receive 1 dose of PCV13. The PCV13 vaccine dose should be obtained 1 or more years after the last PPSV23 vaccine dose.    Pneumococcal polysaccharide (PPSV23) vaccine. When PCV13 is also indicated, PCV13 should be obtained first. All adults aged 45 years and older should be immunized. An adult younger than age 11 years who has certain medical conditions should be immunized. Any person who resides in a nursing home or long-term care facility should be immunized.  An adult smoker should be immunized. People with an immunocompromised condition and certain other conditions should receive both PCV13 and PPSV23 vaccines. People with human immunodeficiency virus (HIV) infection should be immunized as soon as possible after diagnosis. Immunization during chemotherapy or radiation therapy should be avoided. Routine use of PPSV23 vaccine is not recommended for American Indians, [place] Natives, or people younger than 65 years unless there are medical conditions that require PPSV23 vaccine. When indicated, people who have unknown immunization and have no record of immunization should receive PPSV23 vaccine. One-time revaccination 5 years after the first dose of PPSV23 is recommended for people aged 19-64 years who have chronic kidney failure, nephrotic syndrome, asplenia, or immunocompromised conditions. People who received 1-2 doses of PPSV23 before age 80 years should receive another dose of PPSV23 vaccine at age 31 years or later if at least 5 years have passed since the previous dose. Doses of PPSV23 are not needed for people immunized with PPSV23 at or after age 34 years.   Preventive Services / Frequency  Ages 40 years and over  Blood pressure check.  Lipid and cholesterol check.  Lung cancer screening. / Every year if you are aged 72-80 years and have a 30-pack-year history of smoking and currently smoke or have quit within the past 15 years. Yearly screening is stopped once you have quit smoking for at least 15 years or develop a health problem that would prevent you from having lung cancer treatment.  Clinical breast exam.** / Every year after age 22 years.   BRCA-related cancer risk assessment.** / For women who have family members with a BRCA-related cancer (breast, ovarian, tubal, or peritoneal cancers).  Mammogram.** / Every year beginning at age 44 years and continuing for as long as you are in good health. Consult with your health care provider.  Pap  test.** / Every 3 years starting at age 84 years through age 94 or 6 years with 3 consecutive normal Pap tests. Testing can be stopped between 65 and 70 years with 3 consecutive normal Pap tests and no abnormal Pap  or HPV tests in the past 10 years.  Fecal occult blood test (FOBT) of stool. / Every year beginning at age 4 years and continuing until age 64 years. You may not need to do this test if you get a colonoscopy every 10 years.  Flexible sigmoidoscopy or colonoscopy.** / Every 5 years for a flexible sigmoidoscopy or every 10 years for a colonoscopy beginning at age 41 years and continuing until age 71 years.  Hepatitis C blood test.** / For all people born from [birth year] through 1965 and any individual with known risks for hepatitis C.  Osteoporosis screening.** / A one-time screening for women ages 35 years and over and women at risk for fractures or osteoporosis.  Skin self-exam. / Monthly.  Influenza vaccine. / Every year.  Tetanus, diphtheria, and acellular pertussis (Tdap/Td) vaccine.** / 1 dose of Td every 10 years.  Zoster vaccine.** / 1 dose for adults aged 69 years or older.  Pneumococcal 13-valent conjugate (PCV13) vaccine.** / Consult your health care provider.  Pneumococcal polysaccharide (PPSV23) vaccine.** / 1 dose for all adults aged 8 years and older. Screening for abdominal aortic aneurysm (AAA)  by ultrasound is recommended for people who have history of high blood pressure or who are current or former smokers. ++++++++++++++++++++ Recommend Adult Low Dose Aspirin or  coated  Aspirin 81 mg daily  To reduce risk of Colon Cancer 20 %,  Skin Cancer 26 % ,  Melanoma 46%  and  Pancreatic cancer 60% ++++++++++++++++++++ Vitamin D goal  is between 70-100.  Please make sure that you are taking your Vitamin D as directed.  It is very important as a natural anti-inflammatory  helping hair, skin, and nails, as well as reducing stroke and heart attack risk.  It helps  your bones and helps with mood. It also decreases numerous cancer risks so please take it as directed.  Low Vit D is associated with a 200-300% higher risk for CANCER  and 200-300% higher risk for HEART   ATTACK  &  STROKE.   .....................................Marland Kitchen It is also associated with higher death rate at younger ages,  autoimmune diseases like Rheumatoid arthritis, Lupus, Multiple Sclerosis.    Also many other serious conditions, like depression, Alzheimer's Dementia, infertility, muscle aches, fatigue, fibromyalgia - just to name a few. ++++++++++++++++++ Recommend the book "The END of DIETING" by Dr Excell Seltzer  & the book "The END of DIABETES " by Dr Excell Seltzer At [email or web address] - get book & Audio CD's    Being diabetic has a  300% increased risk for heart attack, stroke, cancer, and alzheimer- type vascular dementia. It is very important that you work harder with diet by avoiding all foods that are white. Avoid white rice (brown & wild rice is OK), white potatoes (sweetpotatoes in moderation is OK), White bread or wheat bread or anything made out of white flour like bagels, donuts, rolls, buns, biscuits, cakes, pastries, cookies, pizza crust, and pasta (made from white flour & egg whites) - vegetarian pasta or spinach or wheat pasta is OK. Multigrain breads like Arnold's or Pepperidge [employer], or multigrain sandwich thins or flatbreads.  Diet, exercise and weight loss can reverse and cure diabetes in the early stages.  Diet, exercise and weight loss is very important in the control and prevention of complications of diabetes which affects every system in your body, ie. Brain - dementia/stroke, eyes - glaucoma/blindness, heart - heart attack/heart failure, kidneys - dialysis, stomach - gastric paralysis, intestines - malabsorption, nerves -  severe painful neuritis, circulation - gangrene & loss of a leg(s), and finally cancer and Alzheimers.    I recommend avoid fried & greasy foods,   sweets/candy, white rice (brown or wild rice or Quinoa is OK), white potatoes (sweet potatoes are OK) - anything made from white flour - bagels, doughnuts, rolls, buns, biscuits,white and wheat breads, pizza crust and traditional pasta made of white flour & egg white(vegetarian pasta or spinach or wheat pasta is OK).  Multi-grain bread is OK - like multi-grain flat bread or sandwich thins. Avoid alcohol in excess. Exercise is also important.    Eat all the vegetables you want - avoid meat, especially red meat and dairy - especially cheese.  Cheese is the most concentrated form of trans-fats which is the worst thing to clog up our arteries. Veggie cheese is OK which can be found in the fresh produce section at [employer] or [employer] or [employer]  +++++++++++++++++++ DASH Eating Plan  DASH stands for "Dietary Approaches to Stop Hypertension."   The DASH eating plan is a healthy eating plan that has been shown to reduce high blood pressure (hypertension). Additional health benefits may include reducing the risk of type 2 diabetes mellitus, heart disease, and stroke. The DASH eating plan may also help with weight loss. WHAT DO I NEED TO KNOW ABOUT THE DASH EATING PLAN? For the DASH eating plan, you will follow these general guidelines:  Choose foods with a percent daily value for sodium of less than 5% (as listed on the food label).  Use salt-free seasonings or herbs instead of table salt or sea salt.  Check with your health care provider or pharmacist before using salt substitutes.  Eat lower-sodium products, often labeled as "lower sodium" or "no salt added."  Eat fresh foods.  Eat more vegetables, fruits, and low-fat dairy products.  Choose whole grains. Look for the word "whole" as the first word in the ingredient list.  Choose fish   Limit sweets, desserts, sugars, and sugary drinks.  Choose heart-healthy fats.  Eat veggie cheese   Eat more home-cooked food and less  restaurant, buffet, and fast food.  Limit fried foods.  Cook foods using methods other than frying.  Limit canned vegetables. If you do use them, rinse them well to decrease the sodium.  When eating at a restaurant, ask that your food be prepared with less salt, or no salt if possible.                      WHAT FOODS CAN I EAT? Read Dr Fara Olden Fuhrman's books on The End of Dieting & The End of Diabetes  Grains Whole grain or whole wheat bread. Brown rice. Whole grain or whole wheat pasta. Quinoa, bulgur, and whole grain cereals. Low-sodium cereals. Corn or whole wheat flour tortillas. Whole grain cornbread. Whole grain crackers. Low-sodium crackers.  Vegetables Fresh or frozen vegetables (raw, steamed, roasted, or grilled). Low-sodium or reduced-sodium tomato and vegetable juices. Low-sodium or reduced-sodium tomato sauce and paste. Low-sodium or reduced-sodium canned vegetables.   Fruits All fresh, canned (in natural juice), or frozen fruits.  Protein Products  All fish and seafood.  Dried beans, peas, or lentils. Unsalted nuts and seeds. Unsalted canned beans.  Dairy Low-fat dairy products, such as skim or 1% milk, 2% or reduced-fat cheeses, low-fat ricotta or cottage cheese, or plain low-fat yogurt. Low-sodium or reduced-sodium cheeses.  Fats and Oils Tub margarines without trans fats. Light or reduced-fat mayonnaise and salad  dressings (reduced sodium). Avocado. Safflower, olive, or canola oils. Natural peanut or almond butter.  Other Unsalted popcorn and pretzels. The items listed above may not be a complete list of recommended foods or beverages. Contact your dietitian for more options.  +++++++++++++++  WHAT FOODS ARE NOT RECOMMENDED? Grains/ White flour or wheat flour White bread. White pasta. White rice. Refined cornbread. Bagels and croissants. Crackers that contain trans fat.  Vegetables  Creamed or fried vegetables. Vegetables in a . Regular canned vegetables.  Regular canned tomato sauce and paste. Regular tomato and vegetable juices.  Fruits Dried fruits. Canned fruit in light or heavy syrup. Fruit juice.  Meat and Other Protein Products Meat in general - RED meat & White meat.  Fatty cuts of meat. Ribs, chicken wings, all processed meats as bacon, sausage, bologna, salami, fatback, hot dogs, bratwurst and packaged luncheon meats.  Dairy Whole or 2% milk, cream, half-and-half, and cream cheese. Whole-fat or sweetened yogurt. Full-fat cheeses or blue cheese. Non-dairy creamers and whipped toppings. Processed cheese, cheese spreads, or cheese curds.  Condiments Onion and garlic salt, seasoned salt, table salt, and sea salt. Canned and packaged gravies. Worcestershire sauce. Tartar sauce. Barbecue sauce. Teriyaki sauce. Soy sauce, including reduced sodium. Steak sauce. Fish sauce. Oyster sauce. Cocktail sauce. Horseradish. Ketchup and mustard. Meat flavorings and tenderizers. Bouillon cubes. Hot sauce. Tabasco sauce. Marinades. Taco seasonings. Relishes.  Fats and Oils Butter, stick margarine, lard, shortening and bacon fat. Coconut, palm kernel, or palm oils. Regular salad dressings.  Pickles and olives. Salted popcorn and pretzels.  The items listed above may not be a complete list of foods and beverages to avoid.

## 2018-12-15 NOTE — Progress Notes (Signed)
MEDICARE ANNUAL WELLNESS VISIT AND FOLLOW UP  Assessment:   Debbie Houston was seen today for follow-up and medicare wellness.  Diagnoses and all orders for this visit:  Encounter for Medicare annual wellness exam Yearly  Essential hypertension -     CBC with Differential/Platelet -     COMPLETE METABOLIC PANEL WITH GFR -     Magnesium - continue medications, DASH diet, exercise and monitor at home. Call if greater than 130/80.   Uncomplicated asthma, unspecified asthma severity, unspecified whether persistent  .Doing well on current regiment, continue with benefit Continue to monitor  Colitis Doing well Monitoring diet Continue to monitor  GERD Continue PPI/H2 blocker, diet discussed -     Magnesium  Hypothyroidism, unspecified type Will check TSH Continue current medicaitons Taking levothyroxine whole tablet, 129mg, T, Thurs & Sat, taking half tablet 62.5 all other days.  Osteoarthritis, unspecified osteoarthritis type, unspecified site Doing well Discussed warm moist heat for hands in morning  Osteopenia of multiple sites -     COMPLETE METABOLIC PANEL WITH GFR Continue Calcium 7 Vit D Has DEXA scan scheduled Will continue to monitor  Spondylolisthesis at L4-L5 level S/P lumbar fusion.  Doing well Following with Dr EEllene Route Anemia, unspecified type Will check labs Not taking supplementation at this time   Mixed hyperlipidemia Continue current regiment -     Lipid panel Discussed dietary and exercise modifications  Medication management -     CBC with Differential/Platelet -     COMPLETE METABOLIC PANEL WITH GFR -     Magnesium -     Lipid panel -     TSH -     Hemoglobin A1c -     Insulin, random -     VITAMIN D 25 Hydroxy (Vit-D Deficiency, Fractures)  Vitamin D deficiency -     VITAMIN D 25 Hydroxy (Vit-D Deficiency, Fractures)  Frequency of urination -     Urinalysis w microscopic + reflex cultur Increase water intake -     REFLEXIVE URINE  CULTURE -     Urine Culture  Vaginal itching -     fluconazole (DIFLUCAN) 150 MG tablet; Take one tablet at onset of symptoms and second tablet three days later for yeast. Likely from treatment with ABX post surgery.    Over 40 minutes of exam, counseling, chart review and critical decision making was performed Future Appointments  Date Time Provider DLafferty 04/03/2019 10:30 AM CVicie Mutters PA-C [facility] None     Plan:   During the course of the visit the patient was educated and counseled about appropriate screening and preventive services including:    Pneumococcal vaccine   Prevnar 13  Influenza vaccine  Td vaccine  Screening electrocardiogram  Bone densitometry screening  Colorectal cancer screening  Diabetes screening  Glaucoma screening  Nutrition counseling   Advanced directives: requested   Subjective:  Debbie SAUTTERis a 78y.o. female who presents for Medicare Annual Wellness Visit and 3 month follow up.   She recently has L4-L5 Decompression and stabilization by Dr EEllene Routewhich she tolerated well on 11-25-18.  She is not doing physical therpy but is active and has been walking daily. She reports her pain is well controlled and she takes tylenol as needed for pain.  She is using a lumbar brace daily when up in moving and removes at night.  She also has a shower stool that she  Denies any numbness, tingling weakness or recent falls.  Her next  follow up with Dr Ellene Route is six weeks from now.   Her blood pressure has been controlled at home, today their BP is BP: 116/64 She does workout. She denies chest pain, shortness of breath, dizziness.  She is on cholesterol medication and denies myalgias. Her cholesterol is at goal. The cholesterol last visit was:   Lab Results  Component Value Date   CHOL 199 12/16/2018   HDL 68 12/16/2018   LDLCALC 114 (H) 12/16/2018   TRIG 74 12/16/2018   CHOLHDL 2.9 12/16/2018   . She has been working on diet  and exercise. and denies Last A1C in the office was:  Lab Results  Component Value Date   HGBA1C 5.3 12/16/2018   Last GFR:   Lab Results  Component Value Date   GFRNONAA 84 12/16/2018   Lab Results  Component Value Date   GFRAA 97 12/16/2018   Patient is on Vitamin D supplement.   Lab Results  Component Value Date   VD25OH 44 12/16/2018      Medication Review: Current Outpatient Medications on File Prior to Visit  Medication Sig Dispense Refill  . acetaminophen (TYLENOL) 500 MG tablet Take 500 mg by mouth every 6 (six) hours as needed.    . Ascorbic Acid (VITAMIN C) 1000 MG tablet Take 1,000 mg by mouth daily.     Marland Kitchen aspirin EC 81 MG tablet Take 81 mg by mouth daily.     . Biotin w/ Vitamins C & E (HAIR/SKIN/NAILS PO) Take 1 tablet by mouth daily.    . bisoprolol-hydrochlorothiazide (ZIAC) 2.5-6.25 MG tablet Take 1 tablet daily for BP (Patient taking differently: Take 1/2 tablet daily for BP) 90 tablet 3  . budesonide (ENTOCORT EC) 3 MG 24 hr capsule Take 3 mg by mouth daily.    . Calcium Carbonate-Vitamin D (CALCIUM 600+D HIGH POTENCY) 600-400 MG-UNIT tablet Take 1 tablet by mouth daily.    . Cinnamon 500 MG TABS Take 1,000 mg by mouth daily.     . Coenzyme Q10 (COQ10) 200 MG CAPS Take 200 mg by mouth daily.    . Flaxseed, Linseed, (FLAX SEED OIL) 1000 MG CAPS Take 1,000 mg by mouth daily.     Marland Kitchen levothyroxine (SYNTHROID, LEVOTHROID) 125 MCG tablet TAKE 1 TABLET BY MOUTH DAILY BEFORE BREAKFAST (Patient taking differently: Take 62.5-125 mcg by mouth See admin instructions. Take 125 mcg by mouth daily on Tuesday, Thursday and Saturday. Take 62.5 mcg by mouth daily on all other days.) 90 tablet 1  . Multiple Vitamins-Minerals (MULTIVITAMIN WITH MINERALS) tablet Take 1 tablet by mouth daily.    . Omega-3 Fatty Acids (FISH OIL) 1000 MG CAPS Take 1,000 mg by mouth daily.    . pantoprazole (PROTONIX) 40 MG tablet Take 1 tablet daily for Acid Indigestion & Reflux 90 tablet 3  .  Polyethyl Glycol-Propyl Glycol (SYSTANE) 0.4-0.3 % SOLN Place 1 drop into both eyes 2 (two) times daily as needed (for dry eyes).    . Probiotic Product (PROBIOTIC PO) Take 1 capsule by mouth daily.    . simvastatin (ZOCOR) 20 MG tablet TAKE 1 TABLET BY MOUTH DAILY (Patient taking differently: Take 20 mg by mouth every other day. ) 90 tablet 0  . dexamethasone (DECADRON) 1 MG tablet 2 tablets twice daily for 2 days, one tablet twice daily for 2 days, one tablet daily for 2 days. 15 tablet 0  . famotidine (PEPCID) 40 MG tablet TAKE 1 TABLET(40 MG) BY MOUTH EVERY EVENING (Patient not taking: Reported  on 11/04/2018) 90 tablet 1  . gabapentin (NEURONTIN) 100 MG capsule 1-3 at night for pain (Patient not taking: Reported on 11/04/2018) 90 capsule 1  . methocarbamol (ROBAXIN) 500 MG tablet Take 1 tablet (500 mg total) by mouth every 6 (six) hours as needed for muscle spasms. 40 tablet 3  . oxyCODONE-acetaminophen (PERCOCET/ROXICET) 5-325 MG tablet Take 1-2 tablets by mouth every 3 (three) hours as needed for moderate pain or severe pain. 60 tablet 0  . traMADol (ULTRAM) 50 MG tablet Take 50 mg by mouth every 6 (six) hours as needed for moderate pain.     No current facility-administered medications on file prior to visit.     Allergies  Allergen Reactions  . Fosamax [Alendronate Sodium] Other (See Comments)    Reaction:  GI upset   . Singulair [Montelukast Sodium] Other (See Comments)    Reaction:  Makes pt jittery  . Wellbutrin [Bupropion] Hives  . Sulfa Antibiotics Rash    Current Problems (verified) Patient Active Problem List   Diagnosis Date Noted  . Spondylolisthesis at L4-L5 level 11/25/2018  . Encounter for Medicare annual wellness exam 07/07/2015  . Colitis 12/21/2014  . Medication management 04/20/2014  . Essential hypertension   . Hyperlipidemia   . Hypothyroid   . GERD   . Asthma   . DJD (degenerative joint disease)   . Vitamin D deficiency   . Osteopenia   . Allergy   .  Anemia     Screening Tests Immunization History  Administered Date(s) Administered  . Influenza, High Dose Seasonal PF 07/23/2014, 08/23/2015, 07/10/2016, 08/27/2017, 08/06/2018  . Pneumococcal Conjugate-13 05/05/2015  . Pneumococcal-Unspecified 08/22/2011  . Tdap 08/22/2011  . Zoster 06/20/2006  . Zoster Recombinat (Shingrix) 09/13/2017, 01/01/2018    Preventative Care: Last colonoscopy: 2015 Upper GI: 2017 Last mammogram: 2019, neg, Hx Breast Cx. Last pap smear/pelvic exam: 2012 DEXA:2017, DUE Scheduled. Chest X-ray: 2018 EKG 12/2017  Prior vaccinations: TD or Tdap: 2012, DUE 2022  Influenza: 2019 Pneumococcal: 2012 Prevnar13: 2016 Shingrix: 2019  Names of Other Physician/Practitioners you currently use: 1. [facility] here for primary care 2.Eye Exam 07/2018 3. Dentist 07/2018  Patient Care Team: Unk Pinto, MD as PCP - General (Internal Medicine)  SURGICAL HISTORY She  has a past surgical history that includes Mohs surgery (2005); Tonsillectomy (age 72); Dilation and curettage of uterus; Dilatation & curettage/hysteroscopy with myosure (N/A, 10/27/2014); and Eye surgery. FAMILY HISTORY Her family history includes Cancer in her father, maternal grandmother, mother, and sister; Diabetes in her brother and maternal grandfather; Hyperlipidemia in her brother, father, maternal grandfather, maternal grandmother, mother, paternal grandfather, paternal grandmother, and sister; Hypertension in her mother; Stroke in her maternal grandfather. SOCIAL HISTORY She  reports that she has quit smoking. Her smoking use included cigarettes. She quit after 15.00 years of use. She has never used smokeless tobacco. She reports current alcohol use. She reports that she does not use drugs.   MEDICARE WELLNESS OBJECTIVES: Physical activity: Current Exercise Habits: Structured exercise class, Type of exercise: Other - see comments, Frequency  (Times/Week): 4, Intensity: Moderate Cardiac risk factors: Cardiac Risk Factors include: advanced age (>80mn, >>12women);dyslipidemia Depression/mood screen:   Depression [place] 07/24/2018  Decreased Interest 0  Down, Depressed, Hopeless 0  PHQ - 2 Score 0    ADLs:  In your present state of health, do you have any difficulty performing the following activities: 12/18/2018 11/17/2018  Hearing? N Y  Comment - "age related" hearing  deficit  Vision? N N  Difficulty concentrating or making decisions? N N  Walking or climbing stairs? N Y  Comment - recently d/t back issues  Dressing or bathing? N N  Doing errands, shopping? N N  Preparing Food and eating ? N -  Using the Toilet? N -  In the past six months, have you accidently leaked urine? N -  Do you have problems with loss of bowel control? N -  Managing your Medications? N -  Managing your Finances? N -  Housekeeping or managing your Housekeeping? N -  Some recent data might be hidden     Cognitive Testing  Alert? Yes  Normal Appearance?Yes  Oriented to person? Yes  Place? Yes   Time? Yes  Recall of three objects?  Yes  Can perform simple calculations? Yes  Displays appropriate judgment?Yes  Can read the correct time from a watch face?Yes  EOL planning: Does Patient Have a Medical Advance Directive?: Yes Type of Advance Directive: Living will Does patient want to make changes to medical advance directive?: No - Patient declined  Review of Systems  Constitutional: Negative for chills, diaphoresis, fever, malaise/fatigue and weight loss.  HENT: Negative for congestion, ear discharge, ear pain, hearing loss, nosebleeds, sinus pain, sore throat and tinnitus.   Eyes: Negative for blurred vision, double vision, photophobia, pain, discharge and redness.  Respiratory: Negative for cough, hemoptysis, sputum production, shortness of breath, wheezing and stridor.   Cardiovascular: Negative for chest pain, palpitations,  orthopnea, claudication, leg swelling and PND.  Gastrointestinal: Negative for abdominal pain, blood in stool, constipation, diarrhea, heartburn, melena, nausea and vomiting.  Genitourinary: Positive for dysuria. Negative for flank pain, frequency, hematuria and urgency.  Musculoskeletal: Positive for back pain. Negative for falls, joint pain, myalgias and neck pain.       S/P lumbar fusion.  Skin: Negative for itching and rash.  Neurological: Negative for dizziness, tingling, tremors, sensory change, speech change, focal weakness, seizures, loss of consciousness, weakness and headaches.  Endo/Heme/Allergies: Negative for environmental allergies and polydipsia. Does not bruise/bleed easily.  Psychiatric/Behavioral: Negative for depression, hallucinations, memory loss, substance abuse and suicidal ideas. The patient is not nervous/anxious and does not have insomnia.      Objective:     Today's Vitals   12/16/18 0958  BP: 116/64  Pulse: 74  Temp: (!) 97.5 F (36.4 C)  SpO2: 98%  Weight: 108 lb (49 kg)  Height: 5' 5"  (1.651 m)  PainSc: 5   PainLoc: Back   Body mass index is 17.97 kg/m.  General appearance: alert, no distress, WD/WN, female HEENT: normocephalic, sclerae anicteric, TMs pearly, nares patent, no discharge or erythema, pharynx normal Oral cavity: MMM, no lesions Neck: supple, no lymphadenopathy, no thyromegaly, no masses Heart: RRR, normal S1, S2, no murmurs Lungs: CTA bilaterally, no wheezes, rhonchi, or rales Abdomen: +bs, soft, non tender, non distended, no masses, no hepatomegaly, no splenomegaly Musculoskeletal: nontender, no swelling, no obvious deformity. Lumbar incision incision, well approximated, free of any erythema or exudate.  Wear lumbar brace. Extremities: no edema, no cyanosis, no clubbing Pulses: 2+ symmetric, upper and lower extremities, normal cap refill Neurological: alert, oriented x 3, CN2-12 intact, strength normal upper extremities and lower  extremities, sensation normal throughout, DTRs 2+ throughout, no cerebellar signs, gait normal Psychiatric: normal affect, behavior normal, pleasant   Medicare Attestation I have personally reviewed: The patient's medical and social history Their use of alcohol, tobacco or illicit drugs Their current medications and supplements The patient's  functional ability including ADLs,fall risks, home safety risks, cognitive, and hearing and visual impairment Diet and physical activities Evidence for depression or mood disorders  The patient's weight, height, BMI, and visual acuity have been recorded in the chart.  I have made referrals, counseling, and provided education to the patient based on review of the above and I have provided the patient with a written personalized care plan for preventive services.     Garnet Sierras, NP   12/18/2018

## 2018-12-16 ENCOUNTER — Ambulatory Visit (INDEPENDENT_AMBULATORY_CARE_PROVIDER_SITE_OTHER): Payer: Medicare Other | Admitting: Adult Health Nurse Practitioner

## 2018-12-16 ENCOUNTER — Encounter: Payer: Self-pay | Admitting: Adult Health Nurse Practitioner

## 2018-12-16 VITALS — BP 116/64 | HR 74 | Temp 97.5°F | Ht 65.0 in | Wt 108.0 lb

## 2018-12-16 DIAGNOSIS — R350 Frequency of micturition: Secondary | ICD-10-CM | POA: Diagnosis not present

## 2018-12-16 DIAGNOSIS — K2100 Gastro-esophageal reflux disease with esophagitis, without bleeding: Secondary | ICD-10-CM

## 2018-12-16 DIAGNOSIS — R6889 Other general symptoms and signs: Secondary | ICD-10-CM

## 2018-12-16 DIAGNOSIS — M1990 Unspecified osteoarthritis, unspecified site: Secondary | ICD-10-CM

## 2018-12-16 DIAGNOSIS — E559 Vitamin D deficiency, unspecified: Secondary | ICD-10-CM

## 2018-12-16 DIAGNOSIS — N898 Other specified noninflammatory disorders of vagina: Secondary | ICD-10-CM

## 2018-12-16 DIAGNOSIS — K529 Noninfective gastroenteritis and colitis, unspecified: Secondary | ICD-10-CM

## 2018-12-16 DIAGNOSIS — M8589 Other specified disorders of bone density and structure, multiple sites: Secondary | ICD-10-CM | POA: Diagnosis not present

## 2018-12-16 DIAGNOSIS — E782 Mixed hyperlipidemia: Secondary | ICD-10-CM

## 2018-12-16 DIAGNOSIS — E039 Hypothyroidism, unspecified: Secondary | ICD-10-CM

## 2018-12-16 DIAGNOSIS — I10 Essential (primary) hypertension: Secondary | ICD-10-CM | POA: Diagnosis not present

## 2018-12-16 DIAGNOSIS — J45909 Unspecified asthma, uncomplicated: Secondary | ICD-10-CM

## 2018-12-16 DIAGNOSIS — Z79899 Other long term (current) drug therapy: Secondary | ICD-10-CM | POA: Diagnosis not present

## 2018-12-16 DIAGNOSIS — Z0001 Encounter for general adult medical examination with abnormal findings: Secondary | ICD-10-CM

## 2018-12-16 DIAGNOSIS — D649 Anemia, unspecified: Secondary | ICD-10-CM

## 2018-12-16 DIAGNOSIS — M4316 Spondylolisthesis, lumbar region: Secondary | ICD-10-CM

## 2018-12-16 DIAGNOSIS — Z0000 Encounter for general adult medical examination without abnormal findings: Secondary | ICD-10-CM

## 2018-12-16 MED ORDER — FLUCONAZOLE 150 MG PO TABS: ORAL_TABLET | ORAL | 0 refills | Status: DC

## 2018-12-17 LAB — COMPLETE METABOLIC PANEL WITH GFR
AG Ratio: 2.1 (calc) (ref 1.0–2.5)
AST: 20 U/L (ref 10–35)
Albumin: 4 g/dL (ref 3.6–5.1)
Alkaline phosphatase (APISO): 91 U/L (ref 33–130)
BUN: 12 mg/dL (ref 7–25)
CO2: 25 mmol/L (ref 20–32)
Calcium: 9.3 mg/dL (ref 8.6–10.4)
Chloride: 101 mmol/L (ref 98–110)
Creat: 0.69 mg/dL (ref 0.60–0.93)
GFR, Est Non African American: 84 mL/min/{1.73_m2} (ref >=60)
Globulin: 1.9 g/dL (calc) (ref 1.9–3.7)
Glucose, Bld: 86 mg/dL (ref 65–99)
Potassium: 4.5 mmol/L (ref 3.5–5.3)
Sodium: 135 mmol/L (ref 135–146)
Total Bilirubin: 0.5 mg/dL (ref 0.2–1.2)

## 2018-12-17 LAB — HEMOGLOBIN A1C
Hgb A1c MFr Bld: 5.3 % of total Hgb (ref <5.7)
Mean Plasma Glucose: 105 (calc)
eAG (mmol/L): 5.8 (calc)

## 2018-12-17 LAB — CBC WITH DIFFERENTIAL/PLATELET
Absolute Monocytes: 554 cells/uL (ref 200–950)
Basophils Absolute: 39 cells/uL (ref 0–200)
Basophils Relative: 0.7 %
Eosinophils Absolute: 140 cells/uL (ref 15–500)
Eosinophils Relative: 2.5 %
HCT: 37.1 % (ref 35.0–45.0)
Hemoglobin: 12.5 g/dL (ref 11.7–15.5)
Lymphs Abs: 1137 cells/uL (ref 850–3900)
MCH: 31.3 pg (ref 27.0–33.0)
MCHC: 33.7 g/dL (ref 32.0–36.0)
MCV: 93 fL (ref 80.0–100.0)
MPV: 10.7 fL (ref 7.5–12.5)
Monocytes Relative: 9.9 %
Neutro Abs: 3730 cells/uL (ref 1500–7800)
Neutrophils Relative %: 66.6 %
Platelets: 237 10*3/uL (ref 140–400)
RBC: 3.99 10*6/uL (ref 3.80–5.10)
RDW: 13.1 % (ref 11.0–15.0)
Total Lymphocyte: 20.3 %
WBC: 5.6 10*3/uL (ref 3.8–10.8)

## 2018-12-17 LAB — COMPLETE METABOLIC PANEL WITHOUT GFR
ALT: 16 U/L (ref 6–29)
GFR, Est African American: 97 mL/min/1.73m2 (ref >=60)
Total Protein: 5.9 g/dL — ABNORMAL LOW (ref 6.1–8.1)

## 2018-12-17 LAB — LIPID PANEL
Cholesterol: 199 mg/dL (ref <200)
HDL: 68 mg/dL (ref >50)
LDL Cholesterol (Calc): 114 mg/dL (calc) — ABNORMAL HIGH
Non-HDL Cholesterol (Calc): 131 mg/dL (calc) — ABNORMAL HIGH (ref <130)
Total CHOL/HDL Ratio: 2.9 (calc) (ref <5.0)
Triglycerides: 74 mg/dL (ref <150)

## 2018-12-17 LAB — TSH: TSH: 0.47 mIU/L (ref 0.40–4.50)

## 2018-12-17 LAB — VITAMIN D 25 HYDROXY (VIT D DEFICIENCY, FRACTURES): Vit D, 25-Hydroxy: 44 ng/mL (ref 30–100)

## 2018-12-17 LAB — MAGNESIUM: Magnesium: 1.8 mg/dL (ref 1.5–2.5)

## 2018-12-17 LAB — INSULIN, RANDOM: Insulin: 1.8 u[IU]/mL — ABNORMAL LOW (ref 2.0–19.6)

## 2018-12-18 ENCOUNTER — Encounter: Payer: Self-pay | Admitting: Adult Health Nurse Practitioner

## 2018-12-18 LAB — URINALYSIS W MICROSCOPIC + REFLEX CULTURE
Bacteria, UA: NONE SEEN /HPF
Bilirubin Urine: NEGATIVE
Glucose, UA: NEGATIVE
Hgb urine dipstick: NEGATIVE
Hyaline Cast: NONE SEEN /LPF
Ketones, ur: NEGATIVE
Nitrites, Initial: NEGATIVE
Protein, ur: NEGATIVE
RBC / HPF: NONE SEEN /HPF (ref 0–2)
Specific Gravity, Urine: 1.01 (ref 1.001–1.03)
Squamous Epithelial / HPF: NONE SEEN /HPF (ref <=5)
WBC, UA: NONE SEEN /HPF (ref 0–5)
pH: 7.5 (ref 5.0–8.0)

## 2018-12-18 LAB — URINE CULTURE
MICRO NUMBER:: 121179
SPECIMEN QUALITY:: ADEQUATE

## 2018-12-18 LAB — CULTURE INDICATED

## 2018-12-22 DIAGNOSIS — A09 Infectious gastroenteritis and colitis, unspecified: Secondary | ICD-10-CM | POA: Diagnosis not present

## 2018-12-22 DIAGNOSIS — K2270 Barrett's esophagus without dysplasia: Secondary | ICD-10-CM | POA: Diagnosis not present

## 2018-12-22 DIAGNOSIS — K210 Gastro-esophageal reflux disease with esophagitis: Secondary | ICD-10-CM | POA: Diagnosis not present

## 2018-12-23 DIAGNOSIS — Z981 Arthrodesis status: Secondary | ICD-10-CM | POA: Diagnosis not present

## 2018-12-23 DIAGNOSIS — M8589 Other specified disorders of bone density and structure, multiple sites: Secondary | ICD-10-CM | POA: Diagnosis not present

## 2018-12-23 DIAGNOSIS — Z90710 Acquired absence of both cervix and uterus: Secondary | ICD-10-CM | POA: Diagnosis not present

## 2018-12-23 DIAGNOSIS — Z803 Family history of malignant neoplasm of breast: Secondary | ICD-10-CM | POA: Diagnosis not present

## 2018-12-23 DIAGNOSIS — Z1231 Encounter for screening mammogram for malignant neoplasm of breast: Secondary | ICD-10-CM | POA: Diagnosis not present

## 2018-12-23 DIAGNOSIS — K529 Noninfective gastroenteritis and colitis, unspecified: Secondary | ICD-10-CM | POA: Diagnosis not present

## 2018-12-23 LAB — HM DEXA SCAN

## 2018-12-23 LAB — HM MAMMOGRAPHY

## 2019-01-01 DIAGNOSIS — H15111 Episcleritis periodica fugax, right eye: Secondary | ICD-10-CM | POA: Diagnosis not present

## 2019-01-06 ENCOUNTER — Encounter: Payer: Self-pay | Admitting: Adult Health Nurse Practitioner

## 2019-01-06 ENCOUNTER — Ambulatory Visit (INDEPENDENT_AMBULATORY_CARE_PROVIDER_SITE_OTHER): Payer: Medicare Other | Admitting: Adult Health Nurse Practitioner

## 2019-01-06 ENCOUNTER — Encounter: Payer: Self-pay | Admitting: Physician Assistant

## 2019-01-06 VITALS — BP 110/68 | HR 98 | Temp 98.2°F | Ht 65.0 in | Wt 110.6 lb

## 2019-01-06 DIAGNOSIS — H65193 Other acute nonsuppurative otitis media, bilateral: Secondary | ICD-10-CM

## 2019-01-06 DIAGNOSIS — H9203 Otalgia, bilateral: Secondary | ICD-10-CM

## 2019-01-06 DIAGNOSIS — B373 Candidiasis of vulva and vagina: Secondary | ICD-10-CM

## 2019-01-06 DIAGNOSIS — J029 Acute pharyngitis, unspecified: Secondary | ICD-10-CM | POA: Diagnosis not present

## 2019-01-06 DIAGNOSIS — B3731 Acute candidiasis of vulva and vagina: Secondary | ICD-10-CM

## 2019-01-06 MED ORDER — AZITHROMYCIN 250 MG PO TABS: ORAL_TABLET | ORAL | 0 refills | Status: DC

## 2019-01-06 MED ORDER — FLUCONAZOLE 150 MG PO TABS: ORAL_TABLET | ORAL | 0 refills | Status: DC

## 2019-01-06 NOTE — Patient Instructions (Addendum)
Today you are being treated for: Acute otitis Media with Effusion  Treat the symptoms!  Please take these medications:     Allergy Symptoms / Runny Nose: PICK one   Zyrtec / Cetirizine Take 18m by mouth May cause drowsiness, take nightly Be sure to drink plenty of water If this is not effective, try Xyzal or Allegra  OR  Xyzal / Levocetirazine  Take 539mby mouth May cause drowsiness, take nightly Be sure to drink plenty of water If this is not effective try Allegra or Zyrtec  OR  Allegra / fexofenadine Take 18038my mouth daily If this is not effective try Zyrtec or Xyzal     Fevers or pain  Tylenol Extra Strength 500m59mke 1-2 tablets every 8 hours as needed for fever or pain Do not take more than 4,000mg36mtylenol in 24 hours    Antibiotic: Azithromyacin Take as directed on your prescription  We have sent in Fluconazole for you to take IF you have any vaginal yeast symptoms.    Sore Throat:  Cepacol lozenges as needed You can get these at any pharmacy  Throat Spray: Use as directed on package as needed You may get this at any pharmacy  Warm / Cold drinks: This will help to sooth your throat  Gargle warm salt water: Gargle then spit water out, do not swallow     General Care:  Drink plenty of clear fluids  Get plenty of rest  Wash hands frequently and sanitize shared surfaces, kitchens, bathrooms.  Call or return with new or worsening symptoms

## 2019-01-06 NOTE — Progress Notes (Signed)
Assessment and Plan:  Debbie Houston was seen today for acute visit.  Diagnoses and all orders for this visit:  Acute otitis media with effusion of both ears -     azithromycin (ZITHROMAX) 250 MG tablet; Take 2 tablets PO on day 1, then 1 tablet PO Q24H x 4 days Choose an antihistamine to help dry up nasal drainage.  PICK ONE: Zyrtec / Cetirizine Take 57m by mouth May cause drowsiness, take nightly Be sure to drink plenty of water If this is not effective, try Xyzal OR Allegra  OR  Xyzal / Levocetirazine  Take 553mby mouth May cause drowsiness, take nightly Be sure to drink plenty of water If this is not effective try Allegra OR Zyrtec  OR  Allegra / fexofenadine Take 18040my mouth If this is not effective try Zyrtec OR Xyzal   *If you battle with chronic allergies you may need to change the antihistamine you currently use to find most effective.  Otalgia, of both ears  Tylenol Extra Strength 500m53mke 1-2 tablets every 8 hours as needed for fever or pain Do not take more than 4,000mg60mtylenol in 24 hours  Sore Throat OTC lozenges or throat spray Gargle walt salt water, spit out Warm / cold liquids   General Care: Drink plenty of clear fluids Get plenty of rest Wash hands frequently and sanitize shared surfaces, kitchens, bathrooms.   Vaginal candidiasis -     fluconazole (DIFLUCAN) 150 MG tablet; Take one tablet at onset of symptoms and second tablet three days later for yeast.   Discussed hospital precautions with patient and agrees with plan of care.  Call or return with new or worsening symptoms as discussed in appointment.  May contact via office phone [phone] disposition pending results of labs. Discussed med's effects and SE's.   Over 30 minutes of exam, counseling, chart review, and critical decision making was performed.   Future Appointments  Date Time Provider [place]5/2020 10:30 AM ColliVicie Houston-C [facility] None    ------------------------------------------------------------------------------------------------------------------   HPI 77 y.37female presents for pain behind bilateral ears.  She has noted increased drainage to the back of her throat but denies a sore throat.   She is having popping & cracking in bilateral ears.  She reports some accompanying neck tenderness on both sides.  She has taken sone tylenol for the discomfort that has provided mild relief.  These symptoms has been progressively getting worse over the past week.  Denies any fevers, vision changes, water eyes, nasal congestion or cough.  Past Medical History:  Diagnosis Date  . Arthritis   . Benign labile hypertension   . Cancer (HCC) [place] cancer on face  . Colitis   . DJD (degenerative joint disease)   . Endometrial polyp   . GERD (gastroesophageal reflux disease)   . History of basal cell carcinoma excision   . History of esophagitis   . HOH (hard of hearing)    right ear better than left  -- wears no aides  . Hyperlipidemia   . Hypothyroidism   . Osteopenia   . Prediabetes   . Vitamin D deficiency   . Wears glasses      Allergies  Allergen Reactions  . Fosamax [Alendronate Sodium] Other (See Comments)    Reaction:  GI upset   . Singulair [Montelukast Sodium] Other (See Comments)    Reaction:  Makes pt jittery  . Wellbutrin [Bupropion]  Hives  . Sulfa Antibiotics Rash    Current Outpatient Medications on File Prior to Visit  Medication Sig  . acetaminophen (TYLENOL) 500 MG tablet Take 500 mg by mouth every 6 (six) hours as needed.  . Ascorbic Acid (VITAMIN C) 1000 MG tablet Take 1,000 mg by mouth daily.   Marland Kitchen aspirin EC 81 MG tablet Take 81 mg by mouth daily.   . Biotin w/ Vitamins C & E (HAIR/SKIN/NAILS PO) Take 1 tablet by mouth daily.  . bisoprolol-hydrochlorothiazide (ZIAC) 2.5-6.25 MG tablet Take 1 tablet daily for BP (Patient taking differently: Take 1/2 tablet daily for BP)  .  budesonide (ENTOCORT EC) 3 MG 24 hr capsule Take 3 mg by mouth daily.  . Calcium Carbonate-Vitamin D (CALCIUM 600+D HIGH POTENCY) 600-400 MG-UNIT tablet Take 1 tablet by mouth daily.  . Cinnamon 500 MG TABS Take 1,000 mg by mouth daily.   . Coenzyme Q10 (COQ10) 200 MG CAPS Take 200 mg by mouth daily.  . famotidine (PEPCID) 40 MG tablet TAKE 1 TABLET(40 MG) BY MOUTH EVERY EVENING  . Flaxseed, Linseed, (FLAX SEED OIL) 1000 MG CAPS Take 1,000 mg by mouth daily.   Marland Kitchen levothyroxine (SYNTHROID, LEVOTHROID) 125 MCG tablet TAKE 1 TABLET BY MOUTH DAILY BEFORE BREAKFAST (Patient taking differently: Take 62.5-125 mcg by mouth See admin instructions. Take 125 mcg by mouth daily on Tuesday, Thursday and Saturday. Take 62.5 mcg by mouth daily on all other days.)  . Multiple Vitamins-Minerals (MULTIVITAMIN WITH MINERALS) tablet Take 1 tablet by mouth daily.  . Omega-3 Fatty Acids (FISH OIL) 1000 MG CAPS Take 1,000 mg by mouth daily.  . pantoprazole (PROTONIX) 40 MG tablet Take 1 tablet daily for Acid Indigestion & Reflux  . Polyethyl Glycol-Propyl Glycol (SYSTANE) 0.4-0.3 % SOLN Place 1 drop into both eyes 2 (two) times daily as needed (for dry eyes).  . Probiotic Product (PROBIOTIC PO) Take 1 capsule by mouth daily.  . simvastatin (ZOCOR) 20 MG tablet TAKE 1 TABLET BY MOUTH DAILY (Patient taking differently: Take 20 mg by mouth every other day. )   No current facility-administered medications on file prior to visit.     ROS: Review of Systems  Constitutional: Negative for chills, diaphoresis, fever, malaise/fatigue and weight loss.  HENT: Positive for ear pain and sore throat. Negative for congestion, ear discharge, hearing loss, nosebleeds, sinus pain and tinnitus.        Tender below ears, bilateral.  Eyes: Negative for blurred vision, double vision, photophobia, pain, discharge and redness.  Respiratory: Negative for cough, hemoptysis, sputum production, shortness of breath, wheezing and stridor.    Cardiovascular: Negative for chest pain, palpitations, orthopnea, claudication, leg swelling and PND.  Gastrointestinal: Negative for abdominal pain, blood in stool, constipation, diarrhea, heartburn, melena, nausea and vomiting.  Musculoskeletal: Negative for back pain, falls, joint pain, myalgias and neck pain.  Skin: Negative for itching and rash.     Physical Exam:  BP 110/68   Pulse 98   Temp 98.2 F (36.8 C)   Ht 5' 5"  (1.651 m)   Wt 110 lb 9.6 oz (50.2 kg)   SpO2 97%   BMI 18.40 kg/m   General Appearance: Well nourished, in no apparent distress. Eyes: PERRLA, EOMs, conjunctiva no swelling or erythema Sinuses: No Frontal/maxillary tenderness ENT/Mouth: Ext aud canals clear, TMs erythema, bulging with white effusion bilaterally. No erythema, swelling, or exudate on post pharynx.  Tonsils not swollen or erythematous. Hearing normal.  Neck: Supple, thyroid normal.  Respiratory: Respiratory effort normal,  BS equal bilaterally without rales, rhonchi, wheezing or stridor.  Cardio: RRR with no MRGs. Brisk peripheral pulses without edema.  Abdomen: Soft, + BS.  Non tender, no guarding, rebound, hernias, masses. Lymphatics: Anterior cervical chain, tender with lymphadenopathy. Remaining non tender without lymphadenopathy.  Musculoskeletal: Full ROM, 5/5 strength, normal gait.  Skin: Warm, dry without rashes, lesions, ecchymosis.  Neuro: Cranial nerves intact. Normal muscle tone, no cerebellar symptoms. Sensation intact.  Psych: Awake and oriented X 3, normal affect, Insight and Judgment appropriate.     Garnet Sierras, NP 1:08 PM [facility]

## 2019-01-12 ENCOUNTER — Encounter: Payer: Self-pay | Admitting: Adult Health Nurse Practitioner

## 2019-01-12 ENCOUNTER — Encounter: Payer: Self-pay | Admitting: Physician Assistant

## 2019-01-15 ENCOUNTER — Telehealth: Payer: Self-pay | Admitting: *Deleted

## 2019-01-15 NOTE — Telephone Encounter (Signed)
A message was left to inform the patient there was no significant change in her bone density results. It still shows osteopenia and continue Vitamin D, exercise and Calcium 500 to 600 mg daily.

## 2019-02-10 DIAGNOSIS — R600 Localized edema: Secondary | ICD-10-CM | POA: Diagnosis not present

## 2019-02-10 DIAGNOSIS — I83813 Varicose veins of bilateral lower extremities with pain: Secondary | ICD-10-CM | POA: Diagnosis not present

## 2019-02-16 ENCOUNTER — Other Ambulatory Visit: Payer: Self-pay | Admitting: Adult Health

## 2019-02-16 ENCOUNTER — Other Ambulatory Visit: Payer: Self-pay | Admitting: Internal Medicine

## 2019-02-24 DIAGNOSIS — M48062 Spinal stenosis, lumbar region with neurogenic claudication: Secondary | ICD-10-CM | POA: Diagnosis not present

## 2019-02-25 DIAGNOSIS — S32030A Wedge compression fracture of third lumbar vertebra, initial encounter for closed fracture: Secondary | ICD-10-CM | POA: Diagnosis not present

## 2019-02-25 DIAGNOSIS — M48062 Spinal stenosis, lumbar region with neurogenic claudication: Secondary | ICD-10-CM | POA: Diagnosis not present

## 2019-03-03 DIAGNOSIS — Z981 Arthrodesis status: Secondary | ICD-10-CM | POA: Diagnosis not present

## 2019-03-03 DIAGNOSIS — S32030G Wedge compression fracture of third lumbar vertebra, subsequent encounter for fracture with delayed healing: Secondary | ICD-10-CM | POA: Diagnosis not present

## 2019-03-03 DIAGNOSIS — S32020A Wedge compression fracture of second lumbar vertebra, initial encounter for closed fracture: Secondary | ICD-10-CM | POA: Diagnosis not present

## 2019-03-03 DIAGNOSIS — X58XXXA Exposure to other specified factors, initial encounter: Secondary | ICD-10-CM | POA: Diagnosis not present

## 2019-03-03 DIAGNOSIS — Y929 Unspecified place or not applicable: Secondary | ICD-10-CM | POA: Diagnosis not present

## 2019-03-03 DIAGNOSIS — Y939 Activity, unspecified: Secondary | ICD-10-CM | POA: Diagnosis not present

## 2019-03-03 DIAGNOSIS — Y999 Unspecified external cause status: Secondary | ICD-10-CM | POA: Diagnosis not present

## 2019-03-03 DIAGNOSIS — S32030A Wedge compression fracture of third lumbar vertebra, initial encounter for closed fracture: Secondary | ICD-10-CM | POA: Diagnosis not present

## 2019-03-11 DIAGNOSIS — S32030A Wedge compression fracture of third lumbar vertebra, initial encounter for closed fracture: Secondary | ICD-10-CM | POA: Diagnosis not present

## 2019-03-11 DIAGNOSIS — S32030S Wedge compression fracture of third lumbar vertebra, sequela: Secondary | ICD-10-CM | POA: Diagnosis not present

## 2019-03-24 ENCOUNTER — Other Ambulatory Visit: Payer: Self-pay

## 2019-03-24 ENCOUNTER — Encounter: Payer: Self-pay | Admitting: Adult Health Nurse Practitioner

## 2019-03-24 ENCOUNTER — Ambulatory Visit (INDEPENDENT_AMBULATORY_CARE_PROVIDER_SITE_OTHER): Payer: Medicare Other | Admitting: Adult Health Nurse Practitioner

## 2019-03-24 VITALS — BP 126/80 | HR 69 | Temp 97.5°F | Ht 65.0 in | Wt 110.0 lb

## 2019-03-24 DIAGNOSIS — H9203 Otalgia, bilateral: Secondary | ICD-10-CM

## 2019-03-24 DIAGNOSIS — H6691 Otitis media, unspecified, right ear: Secondary | ICD-10-CM

## 2019-03-24 DIAGNOSIS — J301 Allergic rhinitis due to pollen: Secondary | ICD-10-CM

## 2019-03-24 DIAGNOSIS — R42 Dizziness and giddiness: Secondary | ICD-10-CM | POA: Diagnosis not present

## 2019-03-24 NOTE — Progress Notes (Signed)
Assessment and Plan:  Debbie Houston was seen today for acute visit.  Otalgia from effusion, appears non-infectious at this time.  Discussed supportive measures.  Patient to contact office with new or worsening symptoms.    Acute otitis media with effusion right - Choose an antihistamine to help dry up nasal drainage.  PICK ONE: Take at night Try changing from zyrtec to [place] / fexofenadine Take 187m by mouth If this is not effective try Xyzal   OR  Xyzal / Levocetirazine  Take 519mby mouth May cause drowsiness, take nightly Be sure to drink plenty of water If this is not effective try Zyrtec  OR Zyrtec / Cetirizine Take 1070my mouth May cause drowsiness, take nightly Be sure to drink plenty of water  *If you battle with chronic allergies you may need to change the antihistamine you currently use to find most effective. Add OTC Clairatin or Singulair You can take one of these with option above If no improvement consider antibiotics for bacterial involvement.  Otalgia, of both ears Tylenol Extra Strength 500m66mke 1-2 tablets every 8 hours as needed for fever or pain Do not take more than 4,000mg40mtylenol in 24 hours  General Care: Drink plenty of clear fluids  Vertigo Monitor symptoms, frequency and duration Likely form effusion  Allergic Rhinitis Discussed use of neti pot BID, education provided via printed instructions Continue flonase, use after neti pot use  Patient is agreeable to plan of care.  Discussed hospital precautions  Call or return with new or worsening symptoms as discussed in appointment.  May contact via office phone [phone] on the [place] disposition pending results of labs. Discussed med's effects and SE's.   Over 30 minutes of exam, counseling, chart review, and critical decision making was performed.   Future Appointments  Date Time Provider [place]  4:00 PM ColliVicie MuttersC [facility]  None    ------------------------------------------------------------------------------------------------------------------   HPI 77 y.41female presents for pain behind bilateral ears. She reports the right bothers her more than then left.  She has ear fullness and ear poppoing.  She has also had sone dizziness that is intermittent and transient in  nature.  Also reports some neck tenderness, bilateral.  Taking zyrtec every morning, Nasocort or flonase daily.These symptoms has been progressively getting worse over the past week.  Denies any fevers, vision changes, water eyes, sore throat or cough, shortness of breath or chest pains.  Past Medical History:  Diagnosis Date  . Arthritis   . Benign labile hypertension   . Cancer (HCC) Roundupskin cancer on face  . Colitis   . DJD (degenerative joint disease)   . Endometrial polyp   . GERD (gastroesophageal reflux disease)   . History of basal cell carcinoma excision   . History of esophagitis   . HOH (hard of hearing)    right ear better than left  -- wears no aides  . Hyperlipidemia   . Hypothyroidism   . Osteopenia   . Prediabetes   . Vitamin D deficiency   . Wears glasses      Allergies  Allergen Reactions  . Fosamax [Alendronate Sodium] Other (See Comments)    Reaction:  GI upset   . Singulair [Montelukast Sodium] Other (See Comments)    Reaction:  Makes pt jittery  . Wellbutrin [Bupropion] Hives  . Sulfa Antibiotics Rash    Current Outpatient Medications on File Prior to Visit  Medication Sig  .  acetaminophen (TYLENOL) 500 MG tablet Take 500 mg by mouth every 6 (six) hours as needed.  . Ascorbic Acid (VITAMIN C) 1000 MG tablet Take 1,000 mg by mouth daily.   Marland Kitchen aspirin EC 81 MG tablet Take 81 mg by mouth daily.   . Biotin w/ Vitamins C & E (HAIR/SKIN/NAILS PO) Take 1 tablet by mouth daily.  . bisoprolol-hydrochlorothiazide (ZIAC) 2.5-6.25 MG tablet Take 1 tablet daily for BP (Patient taking differently: 1 tablet daily. )  .  budesonide (ENTOCORT EC) 3 MG 24 hr capsule Take 3 mg by mouth daily.  . Calcium Carbonate-Vitamin D (CALCIUM 600+D HIGH POTENCY) 600-400 MG-UNIT tablet Take 1 tablet by mouth daily.  . Cinnamon 500 MG TABS Take 1,000 mg by mouth daily.   . Coenzyme Q10 (COQ10) 200 MG CAPS Take 200 mg by mouth daily.  . Flaxseed, Linseed, (FLAX SEED OIL) 1000 MG CAPS Take 1,000 mg by mouth daily.   Marland Kitchen levothyroxine (SYNTHROID, LEVOTHROID) 125 MCG tablet TAKE 1 TABLET BY MOUTH DAILY BEFORE BREAKFAST (Patient taking differently: Take one tablet on Sun, Mon, Wed & Fri and 1/2 tablet all other days)  . Multiple Vitamins-Minerals (MULTIVITAMIN WITH MINERALS) tablet Take 1 tablet by mouth daily.  . Omega-3 Fatty Acids (FISH OIL) 1000 MG CAPS Take 1,000 mg by mouth daily.  . pantoprazole (PROTONIX) 40 MG tablet Take 1 tablet daily for Acid Indigestion & Reflux  . Polyethyl Glycol-Propyl Glycol (SYSTANE) 0.4-0.3 % SOLN Place 1 drop into both eyes 2 (two) times daily as needed (for dry eyes).  . Probiotic Product (PROBIOTIC PO) Take 1 capsule by mouth daily.  . simvastatin (ZOCOR) 20 MG tablet TAKE 1 TABLET BY MOUTH DAILY (Patient taking differently: Take one tablet every other day)  . azithromycin (ZITHROMAX) 250 MG tablet Take 2 tablets PO on day 1, then 1 tablet PO Q24H x 4 days  . famotidine (PEPCID) 40 MG tablet TAKE 1 TABLET(40 MG) BY MOUTH EVERY EVENING (Patient not taking: Reported on 03/24/2019)  . fluconazole (DIFLUCAN) 150 MG tablet Take one tablet at onset of symptoms and second tablet three days later for yeast.   No current facility-administered medications on file prior to visit.     ROS: Review of Systems  Constitutional: Negative for chills, diaphoresis, fever, malaise/fatigue and weight loss.  HENT: Positive for congestion and ear pain. Negative for ear discharge, hearing loss, nosebleeds, sinus pain, sore throat and tinnitus.        Tender below ears, bilateral.  Eyes: Negative for blurred vision,  double vision, photophobia, pain, discharge and redness.  Respiratory: Negative for cough, hemoptysis, sputum production, shortness of breath, wheezing and stridor.   Cardiovascular: Negative for chest pain, palpitations, orthopnea, claudication, leg swelling and PND.  Gastrointestinal: Negative for abdominal pain, blood in stool, constipation, diarrhea, heartburn, melena, nausea and vomiting.  Genitourinary: Negative for dysuria, flank pain, frequency, hematuria and urgency.  Musculoskeletal: Negative for back pain, falls, joint pain, myalgias and neck pain.  Skin: Negative for itching and rash.  Neurological: Positive for dizziness. Negative for tingling, tremors, sensory change, speech change, focal weakness, seizures, loss of consciousness, weakness and headaches.     Physical Exam:  BP 126/80   Pulse 69   Temp (!) 97.5 F (36.4 C)   Ht 5' 5"  (1.651 m)   Wt 110 lb (49.9 kg)   SpO2 98%   BMI 18.30 kg/m   General Appearance: Well nourished, in no apparent distress. Eyes: PERRLA, EOMs, conjunctiva no swelling or erythema Sinuses:  No Frontal/maxillary tenderness ENT/Mouth: Ext aud canals clear, TMs erythema, bulging with white effusion bilaterally. No erythema, swelling, or exudate on post pharynx.  Tonsils not swollen or erythematous. Hearing normal.  Neck: Supple, thyroid normal.  Respiratory: Respiratory effort normal, BS equal bilaterally without rales, rhonchi, wheezing or stridor.  Cardio: RRR with no MRGs. Brisk peripheral pulses without edema.  Abdomen: Soft, + BS.  Non tender, no guarding, rebound, hernias, masses. Lymphatics: Anterior cervical chain, tender with lymphadenopathy. Remaining non tender without lymphadenopathy.  Musculoskeletal: Full ROM, 5/5 strength, normal gait.  Skin: Warm, dry without rashes, lesions, ecchymosis.  Neuro: Cranial nerves intact. Normal muscle tone, no cerebellar symptoms. Sensation intact.  Psych: Awake and oriented X 3, normal affect,  Insight and Judgment appropriate.     Garnet Sierras, NP 9:30AM [facility]

## 2019-03-24 NOTE — Patient Instructions (Signed)
Today you are being treated for:   Sinusitis  Upper Respiratory Infection  Pharyngitis    Influenza  Acute Otitis Media with Effusion  Asthma Flare  COPD Flare  Bronchitis  Strep   Treat the symptoms!  Please take these medications:     Allergy Symptoms / Runny Nose:  Zyrtec / Cetirizine   Try adding singular at night Take 91m by mouth May cause drowsiness, take  nightly Be sure to drink plenty of water If this is not effective, try Xyzal or Allegra   OR Allegra / fexofenadine Take 1856mby mouth daily If this is not effective try Zyrtec or Xyzal   OR Xyzal / Levocetirazine  Take 25m74my mouth May cause drowsiness, take nightly Be sure to drink plenty of water If this is not effective try Allegra or Zyrtec   Pick one above and one from below.  Take one in morning and one at night.  Claritin 46m53mily   OR  Singular / Montelukast Take 46mg38mry day   Sinus Congestion:  Sudafed (pseudoephedrine)  Nasal decongestant Take one tablet every 6 hours as needed Check packaging instructions IF you take blood pressure medication, this medication can raise your blood pressure. Monitor your blood pressure and if elevation occurs, stop medication. You can get this at any pharmacy, will need your drivers license to purchase this  Flonase: Nasicort continue this. One spray in each nostril daily  This will help to open your nasal passages  Saline Nasal Spray: You can get this at any pharmacy Use as directed on package This will help to sooth inside o Nasocort f your nose from irritation  [facility] Sinus Rinse / Neti Pot Use warm bottled or distilled water DO NOT use tap water! Use twice a day as needed This will help to sooth irritated sinuses and clear nasal congestion If using nasal sprays, do so after completing this.     Please contact office with new or worsening symptoms

## 2019-03-25 ENCOUNTER — Encounter: Payer: Self-pay | Admitting: Adult Health Nurse Practitioner

## 2019-03-25 DIAGNOSIS — R42 Dizziness and giddiness: Secondary | ICD-10-CM | POA: Insufficient documentation

## 2019-03-25 DIAGNOSIS — H6691 Otitis media, unspecified, right ear: Secondary | ICD-10-CM | POA: Insufficient documentation

## 2019-03-25 DIAGNOSIS — H9203 Otalgia, bilateral: Secondary | ICD-10-CM | POA: Insufficient documentation

## 2019-04-02 NOTE — Progress Notes (Deleted)
CPE AND FOLLOW UP  Assessment:    Essential hypertension - continue medications, DASH diet, exercise and monitor at home. Call if greater than 130/80.   Hypothyroidism, unspecified type Hypothyroidism-check TSH level, continue medications the same, reminded to take on an empty stomach 30-57mns before food.  -     TSH  Mixed hyperlipidemia -continue medications, check lipids, decrease fatty foods, increase activity.   Prediabetes Monitor  Medication management  Anemia, unspecified type - monitor, continue iron supp with Vitamin C and increase green leafy veggies  Vitamin D deficiency Continue supplement  Osteopenia of multiple sites Due DEXA 2019  Osteoarthritis, unspecified osteoarthritis type, unspecified site Monitor  Colitis Continue follow up GI  GERD Continue PPI/H2 blocker, diet discussed  Uncomplicated asthma, unspecified asthma severity, unspecified whether persistent Controlled, avoid triggers  Body mass index (BMI) less than 19 Add ensure  Decreased hearing of both ears with Bilateral chronic serous otitis media -     Ambulatory referral to ENT - ? Need hearing aids versus tubes  Fatigue, unspecified type -     Iron,Total/Total Iron Binding Cap -     Folate RBC -     Methylmalonic acid, serum -     Magnesium, RBC  Over 40 minutes of exam, counseling, chart review, and critical decision making was performed  Future Appointments Future Appointments  Date Time Provider [place] 04/07/2019  4:00 PM CVicie Mutters PA-C [facility] None  04/11/2020  3:00 PM CVicie Mutters PA-C [facility] None      Subjective:   Debbie WOEHRLEis a 78y.o. female who presents for cpe and 3 month follow up on hypertension, prediabetes, hyperlipidemia, vitamin D def.   She has some incontinence, worse when she waits a long time, has history of cystocele.  She has diarrhea, on enocort.  She complains of bilateral hearing loss, left worse than right,  does feel popping/clicking and fluid in her ears, she is on flonase not helping.  Her blood pressure has been controlled at home, she is on BP every other day, today their BP is    She does workout 3 x a week. She denies chest pain, shortness of breath, dizziness.  She is on cholesterol medication and denies myalgias. Her cholesterol is not at goal. The cholesterol last visit was:   Lab Results  Component Value Date   CHOL 199 12/16/2018   HDL 68 12/16/2018   LDLCALC 114 (H) 12/16/2018   TRIG 74 12/16/2018   CHOLHDL 2.9 12/16/2018    She has been working on diet and exercise for prediabetes, and denies paresthesia of the feet, polydipsia, polyuria and visual disturbances. Last A1C in the office was:  Lab Results  Component Value Date   HGBA1C 5.3 12/16/2018   Patient is on Vitamin D supplement.   Lab Results  Component Value Date   VD25OH 44 12/16/2018     She is on thyroid medication. Her medication was not changed last visit. States she still feels tired, brittle hair, cold. Tues and Thurs, she is on a whole and the rest is a half, moved up from 1/2 pill a day.  Lab Results  Component Value Date   TSH 0.47 12/16/2018  .  BMI is There is no height or weight on file to calculate BMI., she is working on diet and exercise. Wt Readings from Last 3 Encounters:  03/24/19 110 lb (49.9 kg)  01/06/19 110 lb 9.6 oz (50.2 kg)  12/16/18 108 lb (49 kg)  Medication Review Current Outpatient Medications on File Prior to Visit  Medication Sig  . acetaminophen (TYLENOL) 500 MG tablet Take 500 mg by mouth every 6 (six) hours as needed.  . Ascorbic Acid (VITAMIN C) 1000 MG tablet Take 1,000 mg by mouth daily.   Marland Kitchen aspirin EC 81 MG tablet Take 81 mg by mouth daily.   Marland Kitchen azithromycin (ZITHROMAX) 250 MG tablet Take 2 tablets PO on day 1, then 1 tablet PO Q24H x 4 days  . Biotin w/ Vitamins C & E (HAIR/SKIN/NAILS PO) Take 1 tablet by mouth daily.  . bisoprolol-hydrochlorothiazide (ZIAC) 2.5-6.25  MG tablet Take 1 tablet daily for BP (Patient taking differently: 1 tablet daily. )  . budesonide (ENTOCORT EC) 3 MG 24 hr capsule Take 3 mg by mouth daily.  . Calcium Carbonate-Vitamin D (CALCIUM 600+D HIGH POTENCY) 600-400 MG-UNIT tablet Take 1 tablet by mouth daily.  . Cinnamon 500 MG TABS Take 1,000 mg by mouth daily.   . Coenzyme Q10 (COQ10) 200 MG CAPS Take 200 mg by mouth daily.  . famotidine (PEPCID) 40 MG tablet TAKE 1 TABLET(40 MG) BY MOUTH EVERY EVENING (Patient not taking: Reported on 03/24/2019)  . Flaxseed, Linseed, (FLAX SEED OIL) 1000 MG CAPS Take 1,000 mg by mouth daily.   . fluconazole (DIFLUCAN) 150 MG tablet Take one tablet at onset of symptoms and second tablet three days later for yeast.  . levothyroxine (SYNTHROID, LEVOTHROID) 125 MCG tablet TAKE 1 TABLET BY MOUTH DAILY BEFORE BREAKFAST (Patient taking differently: Take one tablet on Sun, Mon, Wed & Fri and 1/2 tablet all other days)  . Multiple Vitamins-Minerals (MULTIVITAMIN WITH MINERALS) tablet Take 1 tablet by mouth daily.  . Omega-3 Fatty Acids (FISH OIL) 1000 MG CAPS Take 1,000 mg by mouth daily.  . pantoprazole (PROTONIX) 40 MG tablet Take 1 tablet daily for Acid Indigestion & Reflux  . Polyethyl Glycol-Propyl Glycol (SYSTANE) 0.4-0.3 % SOLN Place 1 drop into both eyes 2 (two) times daily as needed (for dry eyes).  . Probiotic Product (PROBIOTIC PO) Take 1 capsule by mouth daily.  . simvastatin (ZOCOR) 20 MG tablet TAKE 1 TABLET BY MOUTH DAILY (Patient taking differently: Take one tablet every other day)   No current facility-administered medications on file prior to visit.      Current Problems (verified) has Essential hypertension; Hyperlipidemia; Hypothyroid; GERD; Asthma; DJD (degenerative joint disease); Vitamin D deficiency; Osteopenia; Allergy; Anemia; Medication management; Colitis; Encounter for Medicare annual wellness exam; Spondylolisthesis at L4-L5 level; Otalgia of both ears; and Vertigo on their  problem list.   Screening Tests Immunization History  Administered Date(s) Administered  . Influenza, High Dose Seasonal PF 07/23/2014, 08/23/2015, 07/10/2016, 08/27/2017, 08/06/2018  . Pneumococcal Conjugate-13 05/05/2015  . Pneumococcal-Unspecified 08/22/2011  . Tdap 08/22/2011  . Zoster 06/20/2006  . Zoster Recombinat (Shingrix) 09/13/2017, 01/01/2018   Up to date  Preventative care: Last colonoscopy: 2015 Last mammogram: 11/2017 [facility]   DEXA: 11/05/2016 PAP 2012 never abnormal  Names of Other Physician/Practitioners you currently use: 1. [facility]- here for primary care 2. Dr. Sabra Heck, eye doctor, last visit  2018 3. [facility] , dentist, last visit 2018 Patient Care Team: Unk Pinto, MD as PCP - General (Internal Medicine)  Allergies Allergies  Allergen Reactions  . Fosamax [Alendronate Sodium] Other (See Comments)    Reaction:  GI upset   . Singulair [Montelukast Sodium] Other (See Comments)    Reaction:  Makes pt jittery  . Wellbutrin [Bupropion] Hives  .  Sulfa Antibiotics Rash    SURGICAL HISTORY She  has a past surgical history that includes Mohs surgery (2005); Tonsillectomy (age 19); Dilation and curettage of uterus; Dilatation & curettage/hysteroscopy with myosure (N/A, 10/27/2014); and Eye surgery. FAMILY HISTORY Her family history includes Cancer in her father, maternal grandmother, mother, and sister; Diabetes in her brother and maternal grandfather; Hyperlipidemia in her brother, father, maternal grandfather, maternal grandmother, mother, paternal grandfather, paternal grandmother, and sister; Hypertension in her mother; Stroke in her maternal grandfather. SOCIAL HISTORY She  reports that she has quit smoking. Her smoking use included cigarettes. She quit after 15.00 years of use. She has never used smokeless tobacco. She reports current alcohol use. She reports that she does not use drugs.    Objective:    There were no vitals filed for this visit. .  General appearance: alert, no distress, WD/WN,  female HEENT: normocephalic, sclerae anicteric, TMs pearly with effusion bilateral ears, nares patent, no discharge or erythema, pharynx normal Oral cavity: MMM, no lesions Neck: supple, no lymphadenopathy, no thyromegaly, no masses Heart: RRR, normal S1, S2, no murmurs Lungs: CTA bilaterally, no wheezes, rhonchi, or rales Abdomen: +bs, soft, non tender, non distended, no masses, no hepatomegaly, no splenomegaly Musculoskeletal: nontender, no swelling, no obvious deformity Extremities: no edema, no cyanosis, no clubbing Pulses: 2+ symmetric, upper and lower extremities, normal cap refill Neurological: alert, oriented x 3, CN2-12 intact, strength normal upper extremities and lower extremities, sensation normal throughout, DTRs 2+ throughout, no cerebellar signs, gait normal Psychiatric: normal affect, behavior normal, pleasant   Vicie Mutters, PA-C

## 2019-04-03 ENCOUNTER — Encounter: Payer: Self-pay | Admitting: Physician Assistant

## 2019-04-06 DIAGNOSIS — K2270 Barrett's esophagus without dysplasia: Secondary | ICD-10-CM | POA: Diagnosis not present

## 2019-04-06 DIAGNOSIS — K210 Gastro-esophageal reflux disease with esophagitis: Secondary | ICD-10-CM | POA: Diagnosis not present

## 2019-04-06 DIAGNOSIS — A09 Infectious gastroenteritis and colitis, unspecified: Secondary | ICD-10-CM | POA: Diagnosis not present

## 2019-04-07 ENCOUNTER — Encounter: Payer: Self-pay | Admitting: Physician Assistant

## 2019-04-07 ENCOUNTER — Ambulatory Visit (INDEPENDENT_AMBULATORY_CARE_PROVIDER_SITE_OTHER): Payer: Medicare Other | Admitting: Physician Assistant

## 2019-04-07 ENCOUNTER — Other Ambulatory Visit: Payer: Self-pay

## 2019-04-07 VITALS — BP 120/80 | HR 67 | Temp 98.7°F | Ht 64.5 in | Wt 112.0 lb

## 2019-04-07 DIAGNOSIS — T7840XA Allergy, unspecified, initial encounter: Secondary | ICD-10-CM

## 2019-04-07 DIAGNOSIS — E782 Mixed hyperlipidemia: Secondary | ICD-10-CM

## 2019-04-07 DIAGNOSIS — Z79899 Other long term (current) drug therapy: Secondary | ICD-10-CM

## 2019-04-07 DIAGNOSIS — E039 Hypothyroidism, unspecified: Secondary | ICD-10-CM | POA: Diagnosis not present

## 2019-04-07 DIAGNOSIS — D649 Anemia, unspecified: Secondary | ICD-10-CM

## 2019-04-07 DIAGNOSIS — D692 Other nonthrombocytopenic purpura: Secondary | ICD-10-CM

## 2019-04-07 DIAGNOSIS — M1990 Unspecified osteoarthritis, unspecified site: Secondary | ICD-10-CM

## 2019-04-07 DIAGNOSIS — H9201 Otalgia, right ear: Secondary | ICD-10-CM

## 2019-04-07 DIAGNOSIS — M542 Cervicalgia: Secondary | ICD-10-CM | POA: Diagnosis not present

## 2019-04-07 DIAGNOSIS — R51 Headache: Secondary | ICD-10-CM

## 2019-04-07 DIAGNOSIS — K529 Noninfective gastroenteritis and colitis, unspecified: Secondary | ICD-10-CM

## 2019-04-07 DIAGNOSIS — M8589 Other specified disorders of bone density and structure, multiple sites: Secondary | ICD-10-CM | POA: Diagnosis not present

## 2019-04-07 DIAGNOSIS — E559 Vitamin D deficiency, unspecified: Secondary | ICD-10-CM

## 2019-04-07 DIAGNOSIS — I10 Essential (primary) hypertension: Secondary | ICD-10-CM

## 2019-04-07 DIAGNOSIS — R42 Dizziness and giddiness: Secondary | ICD-10-CM

## 2019-04-07 DIAGNOSIS — R519 Headache, unspecified: Secondary | ICD-10-CM

## 2019-04-07 MED ORDER — CYCLOBENZAPRINE HCL 10 MG PO TABS: 10.0000 mg | ORAL_TABLET | Freq: Every day | ORAL | 0 refills | Status: DC

## 2019-04-07 MED ORDER — AMOXICILLIN-POT CLAVULANATE 875-125 MG PO TABS: 1.0000 | ORAL_TABLET | Freq: Two times a day (BID) | ORAL | 0 refills | Status: AC

## 2019-04-07 NOTE — Patient Instructions (Addendum)
Heating pad, soft food, flexeril at night for 5-7 nights.  If this does not help we after 2-4 weeks let me know and we will try you on tegretol for possible trigeminal neuralgia  Will send to ortho for neck pain and osteoporsis  What is the TMJ? The temporomandibular (tem-PUH-ro-man-DIB-yoo-ler) joint, or the TMJ, connects the upper and lower jawbones. This joint allows the jaw to open wide and move back and forth when you chew, talk, or yawn.There are also several muscles that help this joint move. There can be muscle tightness and pain in the muscle that can cause several symptoms.  What causes TMJ pain? There are many causes of TMJ pain. Repeated chewing (for example, chewing gum) and clenching your teeth can cause pain in the joint. Some TMJ pain has no obvious cause. What can I do to ease the pain? There are many things you can do to help your pain get better. When you have pain:  Eat soft foods and stay away from chewy foods (for example, taffy) Try to use both sides of your mouth to chew Don't chew gum Massage Don't open your mouth wide (for example, during yawning or singing) Don't bite your cheeks or fingernails Lower your amount of stress and worry Applying a warm, damp washcloth to the joint may help. Over-the-counter pain medicines such as ibuprofen (one brand: Advil) or acetaminophen (one brand: Tylenol) might also help. Do not use these medicines if you are allergic to them or if your doctor told you not to use them. How can I stop the pain from coming back? When your pain is better, you can do these exercises to make your muscles stronger and to keep the pain from coming back:  Resisted mouth opening: Place your thumb or two fingers under your chin and open your mouth slowly, pushing up lightly on your chin with your thumb. Hold for three to six seconds. Close your mouth slowly. Resisted mouth closing: Place your thumbs under your chin and your two index fingers on the ridge  between your mouth and the bottom of your chin. Push down lightly on your chin as you close your mouth. Tongue up: Slowly open and close your mouth while keeping the tongue touching the roof of the mouth. Side-to-side jaw movement: Place an object about one fourth of an inch thick (for example, two tongue depressors) between your front teeth. Slowly move your jaw from side to side. Increase the thickness of the object as the exercise becomes easier Forward jaw movement: Place an object about one fourth of an inch thick between your front teeth and move the bottom jaw forward so that the bottom teeth are in front of the top teeth. Increase the thickness of the object as the exercise becomes easier. These exercises should not be painful. If it hurts to do these exercises, stop doing them and talk to your family doctor.  Trigeminal Neuralgia  Trigeminal neuralgia is a nerve disorder that causes attacks of severe facial pain. The attacks last from a few seconds to several minutes. They can happen for days, weeks, or months and then go away for months or years. Trigeminal neuralgia is also called tic douloureux. What are the causes? This condition is caused by damage to a nerve in the face that is called the trigeminal nerve. An attack can be triggered by:  Talking.  Chewing.  Putting on makeup.  Washing your face.  Shaving your face.  Brushing your teeth.  Touching your face.  What increases the risk? This condition is more likely to develop in:  Women.  People who are 62 years of age or older. What are the signs or symptoms? The main symptom of this condition is pain in the jaw, lips, eyes, nose, scalp, forehead, and face. The pain may be intense, stabbing, electric, or shock-like. How is this diagnosed? This condition is diagnosed with a physical exam. A CT scan or MRI may be done to rule out other conditions that can cause facial pain. How is this treated? This condition may be  treated with:  Avoiding the things that trigger your attacks.  Pain medicine.  Surgery. This may be done in severe cases if other medical treatment does not provide relief. Follow these instructions at home:  Take over-the-counter and prescription medicines only as told by your health care provider.  If you wish to get pregnant, talk with your health care provider before you start trying to get pregnant.  Avoid the things that trigger your attacks. It may help to: ? Chew on the unaffected side of your mouth. ? Avoid touching your face. ? Avoid blasts of hot or cold air. Contact a health care provider if:  Your pain medicine is not helping.  You develop new, unexplained symptoms, such as: ? Double vision. ? Facial weakness. ? Changes in hearing or balance.  You become pregnant. Get help right away if:  Your pain is unbearable, and your pain medicine does not help. This information is not intended to replace advice given to you by your health care provider. Make sure you discuss any questions you have with your health care provider. Document Released: 11/02/2000 Document Revised: 10/04/2017 Document Reviewed: 02/28/2015 Elsevier Interactive Patient Education  2019 [employer].

## 2019-04-07 NOTE — Progress Notes (Signed)
CPE AND FOLLOW UP  Assessment:   Essential hypertension - continue medications, DASH diet, exercise and monitor at home. Call if greater than 130/80.  -     CBC with Differential/Platelet -     COMPLETE METABOLIC PANEL WITH GFR -     TSH -     Urinalysis, Routine w reflex microscopic -     Microalbumin / creatinine urine ratio -     EKG 12-Lead  Senile purpura (HCC) Discussed process, protect skin, sunscreen  Colitis Has follow up with GI and schedule colonoscopy/EGD  Hypothyroidism, unspecified type -     TSH Hypothyroidism-check TSH level, continue medications the same, reminded to take on an empty stomach 30-59mns before food.   Osteopenia of multiple sites -     Ambulatory referral to Orthopedic Surgery Suggest prolia with recent fracture, she is unable to tolerate fosamax  Osteoarthritis, unspecified osteoarthritis type, unspecified site Increase exericse  Mixed hyperlipidemia -     Lipid panel check lipids decrease fatty foods increase activity.   Medication management -     Magnesium  Anemia, unspecified type Monitor CBC Increase leafy greens  Allergic state, initial encounter Continue medications  Vertigo Improved/stable  Vitamin D deficiency -     VITAMIN D 25 Hydroxy (Vit-D Deficiency, Fractures)  Neck pain -     Ambulatory referral to Orthopedic Surgery -     cyclobenzaprine (FLEXERIL) 10 MG tablet; Take 1 tablet (10 mg total) by mouth at bedtime. ? Contributing to HA  Temple tenderness -     cyclobenzaprine (FLEXERIL) 10 MG tablet; Take 1 tablet (10 mg total) by mouth at bedtime. -     Sedimentation rate -     C-reactive protein Will check Sed rate rule out TA Treat possible TMJ with flexeril, soft foods, heating bad Treat right ear otitis media If not better possible trigeminal neuralgia and will start on tegretol.  She was informed to call 911 if she develop any new symptoms such as worsening headaches, episodes of blurred vision,  double vision or complete loss of vision or speech difficulties or motor weakness.  Right ear pain -     amoxicillin-clavulanate (AUGMENTIN) 875-125 MG tablet; Take 1 tablet by mouth 2 (two) times daily for 7 days.    Over 40 minutes of exam, counseling, chart review, and critical decision making was performed  Future Appointments Future Appointments  Date Time Provider [place] 04/11/2020  3:00 PM CVicie Mutters PA-C [facility] None      Subjective:   Debbie BUBOLTZis a 78y.o. female who presents for cpe and 3 month follow up on hypertension, prediabetes, hyperlipidemia, vitamin D def.   She had back surgery in Jan. She had vertebral fracture, she had a DEXA 12/2018 that showed left femur with -2.3, she can not tolerate fosamax, will refer to ortho for possible prolia.   She is left handed, has had neck pain for years but it has gotten worse, bilateral occipital, worse up to her right side, can be difficult to even touch that right side. She has seen PT several years ago. She will have numbness/tingling bilateral arms, worse right arm. Some pain with touching her right face.  Normal MRI 2016 brain Never MRI of her neck  She states she has not been feeling well since that time.  She complains of bilateral hearing loss, left worse than right, does feel popping/clicking and fluid in her ears, she is on flonase not helping.   She  has some incontinence, worse when she waits a long time, has history of cystocele.  She has diarrhea has appointment for colonoscopy and endoscopy, on enocort which was just increased.    Her blood pressure has been controlled at home, she is on BP every other day, today their BP is BP: 120/80  She does workout 3 x a week. She denies chest pain, shortness of breath, dizziness.  She is on cholesterol medication and denies myalgias. Her cholesterol is not at goal. The cholesterol last visit was:   Lab Results  Component Value Date   CHOL 199  12/16/2018   HDL 68 12/16/2018   LDLCALC 114 (H) 12/16/2018   TRIG 74 12/16/2018   CHOLHDL 2.9 12/16/2018    She has been working on diet and exercise for prediabetes, and denies paresthesia of the feet, polydipsia, polyuria and visual disturbances. Last A1C in the office was:  Lab Results  Component Value Date   HGBA1C 5.3 12/16/2018   Patient is on Vitamin D supplement.   Lab Results  Component Value Date   VD25OH 44 12/16/2018     She is on thyroid medication. Her medication was not changed last visit. States she still feels tired, brittle hair, cold. Tues and Thurs, she is on a whole and the rest is a half, moved up from 1/2 pill a day.  Lab Results  Component Value Date   TSH 0.47 12/16/2018  .  BMI is Body mass index is 18.93 kg/m., she is working on diet and exercise. Wt Readings from Last 3 Encounters:  04/07/19 112 lb (50.8 kg)  03/24/19 110 lb (49.9 kg)  01/06/19 110 lb 9.6 oz (50.2 kg)    Medication Review Current Outpatient Medications on File Prior to Visit  Medication Sig  . acetaminophen (TYLENOL) 500 MG tablet Take 500 mg by mouth every 6 (six) hours as needed.  . Ascorbic Acid (VITAMIN C) 1000 MG tablet Take 1,000 mg by mouth daily.   Marland Kitchen aspirin EC 81 MG tablet Take 81 mg by mouth daily.   . Biotin w/ Vitamins C & E (HAIR/SKIN/NAILS PO) Take 1 tablet by mouth daily.  . bisoprolol-hydrochlorothiazide (ZIAC) 2.5-6.25 MG tablet Take 1 tablet daily for BP (Patient taking differently: 1 tablet daily. )  . budesonide (ENTOCORT EC) 3 MG 24 hr capsule Take 3 mg by mouth daily.  . Calcium Carbonate-Vitamin D (CALCIUM 600+D HIGH POTENCY) 600-400 MG-UNIT tablet Take 1 tablet by mouth daily.  . Cinnamon 500 MG TABS Take 1,000 mg by mouth daily.   . Coenzyme Q10 (COQ10) 200 MG CAPS Take 200 mg by mouth daily.  Marland Kitchen levothyroxine (SYNTHROID, LEVOTHROID) 125 MCG tablet TAKE 1 TABLET BY MOUTH DAILY BEFORE BREAKFAST (Patient taking differently: Take one tablet on Sun, Mon, Wed &  Fri and 1/2 tablet all other days)  . Multiple Vitamins-Minerals (MULTIVITAMIN WITH MINERALS) tablet Take 1 tablet by mouth daily.  . Omega-3 Fatty Acids (FISH OIL) 1000 MG CAPS Take 1,000 mg by mouth daily.  . pantoprazole (PROTONIX) 40 MG tablet Take 1 tablet daily for Acid Indigestion & Reflux  . Polyethyl Glycol-Propyl Glycol (SYSTANE) 0.4-0.3 % SOLN Place 1 drop into both eyes 2 (two) times daily as needed (for dry eyes).  . Probiotic Product (PROBIOTIC PO) Take 1 capsule by mouth daily.  . simvastatin (ZOCOR) 20 MG tablet TAKE 1 TABLET BY MOUTH DAILY (Patient taking differently: Take one tablet every other day)   No current facility-administered medications on file prior to visit.  Current Problems (verified) has Essential hypertension; Hyperlipidemia; Hypothyroid; GERD; Asthma; DJD (degenerative joint disease); Vitamin D deficiency; Osteopenia; Allergy; Anemia; Medication management; Colitis; Encounter for Medicare annual wellness exam; Spondylolisthesis at L4-L5 level; Otalgia of both ears; and Vertigo on their problem list.   Screening Tests Immunization History  Administered Date(s) Administered  . Influenza, High Dose Seasonal PF 07/23/2014, 08/23/2015, 07/10/2016, 08/27/2017, 08/06/2018  . Pneumococcal Conjugate-13 05/05/2015  . Pneumococcal-Unspecified 08/22/2011  . Tdap 08/22/2011  . Zoster 06/20/2006  . Zoster Recombinat (Shingrix) 09/13/2017, 01/01/2018   Up to date  Preventative care: Last colonoscopy: 2015 Last mammogram: 11/2017 [facility]   DEXA: 11/05/2016 PAP 2012 never abnormal  Names of Other Physician/Practitioners you currently use: 1. [facility]- here for primary care 2. Dr. Sabra Heck, eye doctor, last visit  2018 3. [facility] , dentist, last visit 2018 Patient Care Team: Unk Pinto, MD as PCP - General (Internal Medicine)  Allergies Allergies  Allergen Reactions  . Fosamax [Alendronate Sodium] Other  (See Comments)    Reaction:  GI upset   . Singulair [Montelukast Sodium] Other (See Comments)    Reaction:  Makes pt jittery  . Wellbutrin [Bupropion] Hives  . Sulfa Antibiotics Rash    SURGICAL HISTORY She  has a past surgical history that includes Mohs surgery (2005); Tonsillectomy (age 89); Dilation and curettage of uterus; Dilatation & curettage/hysteroscopy with myosure (N/A, 10/27/2014); and Eye surgery. FAMILY HISTORY Her family history includes Cancer in her father, maternal grandmother, mother, and sister; Diabetes in her brother and maternal grandfather; Hyperlipidemia in her brother, father, maternal grandfather, maternal grandmother, mother, paternal grandfather, paternal grandmother, and sister; Hypertension in her mother; Stroke in her maternal grandfather. SOCIAL HISTORY She  reports that she has quit smoking. Her smoking use included cigarettes. She quit after 15.00 years of use. She has never used smokeless tobacco. She reports current alcohol use. She reports that she does not use drugs.    Objective:   Today's Vitals   04/07/19 1344  BP: 120/80  Pulse: 67  Temp: 98.7 F (37.1 C)  SpO2: 98%  Weight: 112 lb (50.8 kg)  Height: 5' 4.5" (1.638 m)   .  General appearance: alert, no distress, WD/WN,  female HEENT: normocephalic, sclerae anicteric, TMs pearly with effusion bilateral ears, nares patent, no discharge or erythema, pharynx normal Oral cavity: MMM, no lesions Neck: supple, no lymphadenopathy, no thyromegaly, no masses Heart: RRR, normal S1, S2, no murmurs Lungs: CTA bilaterally, no wheezes, rhonchi, or rales Abdomen: +bs, soft, non tender, non distended, no masses, no hepatomegaly, no splenomegaly Musculoskeletal: nontender, no swelling, no obvious deformity Extremities: no edema, no cyanosis, no clubbing Pulses: 2+ symmetric, upper and lower extremities, normal cap refill Neurological: alert, oriented x 3, CN2-12 intact, strength normal upper extremities  and lower extremities, sensation normal throughout, DTRs 2+ throughout, no cerebellar signs, gait normal Psychiatric: normal affect, behavior normal, pleasant   Vicie Mutters, PA-C

## 2019-04-08 LAB — COMPLETE METABOLIC PANEL WITH GFR
AG Ratio: 2.7 (calc) — ABNORMAL HIGH (ref 1.0–2.5)
ALT: 17 U/L (ref 6–29)
AST: 22 U/L (ref 10–35)
Albumin: 4.3 g/dL (ref 3.6–5.1)
Alkaline phosphatase (APISO): 58 U/L (ref 37–153)
BUN: 16 mg/dL (ref 7–25)
CO2: 27 mmol/L (ref 20–32)
Calcium: 9.4 mg/dL (ref 8.6–10.4)
Chloride: 101 mmol/L (ref 98–110)
Creat: 0.77 mg/dL (ref 0.60–0.93)
GFR, Est African American: 86 mL/min/{1.73_m2} (ref >=60)
GFR, Est Non African American: 74 mL/min/{1.73_m2} (ref >=60)
Globulin: 1.6 g/dL (calc) — ABNORMAL LOW (ref 1.9–3.7)
Glucose, Bld: 122 mg/dL — ABNORMAL HIGH (ref 65–99)
Potassium: 4.4 mmol/L (ref 3.5–5.3)
Sodium: 137 mmol/L (ref 135–146)
Total Bilirubin: 0.4 mg/dL (ref 0.2–1.2)
Total Protein: 5.9 g/dL — ABNORMAL LOW (ref 6.1–8.1)

## 2019-04-08 LAB — CBC WITH DIFFERENTIAL/PLATELET
Absolute Monocytes: 471 cells/uL (ref 200–950)
Basophils Absolute: 53 cells/uL (ref 0–200)
Basophils Relative: 0.7 %
Eosinophils Absolute: 68 cells/uL (ref 15–500)
Eosinophils Relative: 0.9 %
HCT: 38.7 % (ref 35.0–45.0)
Hemoglobin: 13.2 g/dL (ref 11.7–15.5)
Lymphs Abs: 1064 cells/uL (ref 850–3900)
MCH: 30.8 pg (ref 27.0–33.0)
MCHC: 34.1 g/dL (ref 32.0–36.0)
MCV: 90.2 fL (ref 80.0–100.0)
MPV: 10.8 fL (ref 7.5–12.5)
Monocytes Relative: 6.2 %
Neutro Abs: 5943 cells/uL (ref 1500–7800)
Neutrophils Relative %: 78.2 %
Platelets: 220 10*3/uL (ref 140–400)
RBC: 4.29 10*6/uL (ref 3.80–5.10)
RDW: 14 % (ref 11.0–15.0)
Total Lymphocyte: 14 %
WBC: 7.6 10*3/uL (ref 3.8–10.8)

## 2019-04-08 LAB — URINALYSIS, ROUTINE W REFLEX MICROSCOPIC
Bilirubin Urine: NEGATIVE
Glucose, UA: NEGATIVE
Hgb urine dipstick: NEGATIVE
Ketones, ur: NEGATIVE
Leukocytes,Ua: NEGATIVE
Nitrite: NEGATIVE
Protein, ur: NEGATIVE
Specific Gravity, Urine: 1.012 (ref 1.001–1.03)
pH: 7 (ref 5.0–8.0)

## 2019-04-08 LAB — VITAMIN D 25 HYDROXY (VIT D DEFICIENCY, FRACTURES): Vit D, 25-Hydroxy: 66 ng/mL (ref 30–100)

## 2019-04-08 LAB — LIPID PANEL
Cholesterol: 186 mg/dL (ref <200)
HDL: 65 mg/dL (ref >=50)
LDL Cholesterol (Calc): 102 mg/dL (calc) — ABNORMAL HIGH
Non-HDL Cholesterol (Calc): 121 mg/dL (calc) (ref <130)
Total CHOL/HDL Ratio: 2.9 (calc) (ref <5.0)
Triglycerides: 92 mg/dL (ref <150)

## 2019-04-08 LAB — MICROALBUMIN / CREATININE URINE RATIO
Creatinine, Urine: 38 mg/dL (ref 20–275)
Microalb Creat Ratio: 8 mcg/mg creat (ref <30)
Microalb, Ur: 0.3 mg/dL

## 2019-04-08 LAB — C-REACTIVE PROTEIN: CRP: 0.5 mg/L (ref <8.0)

## 2019-04-08 LAB — MAGNESIUM: Magnesium: 1.8 mg/dL (ref 1.5–2.5)

## 2019-04-08 LAB — TSH: TSH: 0.07 mIU/L — ABNORMAL LOW (ref 0.40–4.50)

## 2019-04-08 LAB — SEDIMENTATION RATE: Sed Rate: 6 mm/h (ref 0–30)

## 2019-04-14 DIAGNOSIS — M542 Cervicalgia: Secondary | ICD-10-CM | POA: Diagnosis not present

## 2019-04-20 DIAGNOSIS — M542 Cervicalgia: Secondary | ICD-10-CM | POA: Diagnosis not present

## 2019-04-22 DIAGNOSIS — M542 Cervicalgia: Secondary | ICD-10-CM | POA: Diagnosis not present

## 2019-04-27 DIAGNOSIS — M542 Cervicalgia: Secondary | ICD-10-CM | POA: Diagnosis not present

## 2019-04-29 DIAGNOSIS — M542 Cervicalgia: Secondary | ICD-10-CM | POA: Diagnosis not present

## 2019-04-30 ENCOUNTER — Ambulatory Visit (INDEPENDENT_AMBULATORY_CARE_PROVIDER_SITE_OTHER): Payer: Medicare Other | Admitting: Adult Health

## 2019-04-30 ENCOUNTER — Other Ambulatory Visit: Payer: Self-pay | Admitting: Adult Health

## 2019-04-30 ENCOUNTER — Other Ambulatory Visit: Payer: Self-pay

## 2019-04-30 ENCOUNTER — Encounter: Payer: Self-pay | Admitting: Adult Health

## 2019-04-30 VITALS — BP 122/76 | HR 74 | Temp 98.6°F | Ht 64.5 in | Wt 111.4 lb

## 2019-04-30 DIAGNOSIS — R350 Frequency of micturition: Secondary | ICD-10-CM

## 2019-04-30 DIAGNOSIS — R300 Dysuria: Secondary | ICD-10-CM

## 2019-04-30 DIAGNOSIS — R3915 Urgency of urination: Secondary | ICD-10-CM | POA: Diagnosis not present

## 2019-04-30 NOTE — Progress Notes (Signed)
Assessment and Plan:  Iwalani was seen today for dysuria.  Diagnoses and all orders for this visit:  Dysuria/urgency/frequency UTI versus OAB versus vaginal dryness,  possible underlying mild OAB due to duration with superimposed UTI If persistent consider pelvic exam as last remote; insufficient time today Hygiene discussed, push fluids - will check UA, C&S, abx tomorrow if suggestive of UTI If not suggestive of UTI, myrbetriq samples given and will have her start this -     Urinalysis w microscopic + reflex cultur  Further disposition pending results of labs. Discussed med's effects and SE's.   Over 15 minutes of exam, counseling, chart review, and critical decision making was performed.   Future Appointments  Date Time Provider [place]  07/28/2019 11:15 AM Unk Pinto, MD [facility] None  04/11/2020  3:00 PM Vicie Mutters, PA-C [facility] None    ------------------------------------------------------------------------------------------------------------------   HPI BP 122/76   Pulse 74   Temp 98.6 F (37 C)   Ht 5' 4.5" (1.638 m)   Wt 111 lb 6.4 oz (50.5 kg)   SpO2 97%   BMI 18.83 kg/m   78 y.o.female presents for evaluation of urinary symptoms; she reports increasing frequency, she reports in the last week worse, increasing frequency, urgency, dysuria yesterday. She denies change in character (odor, cloudiness, dark urine). She denies vaginal discharge, married but not sexually active. Last UTI was several years ago. She does endorse suprapubic pressure/discomfort in the last week. Denies flank pain, fever/chills.   No hx of kidney stones.   Past Medical History:  Diagnosis Date  . Arthritis   . Benign labile hypertension   . Cancer ([place])    skin cancer on face  . Colitis   . DJD (degenerative joint disease)   . Endometrial polyp   . GERD (gastroesophageal reflux disease)   . History of basal cell carcinoma excision   . History of esophagitis   .  HOH (hard of hearing)    right ear better than left  -- wears no aides  . Hyperlipidemia   . Hypothyroidism   . Osteopenia   . Prediabetes   . Vitamin D deficiency   . Wears glasses      Allergies  Allergen Reactions  . Fosamax [Alendronate Sodium] Other (See Comments)    Reaction:  GI upset   . Singulair [Montelukast Sodium] Other (See Comments)    Reaction:  Makes pt jittery  . Wellbutrin [Bupropion] Hives  . Sulfa Antibiotics Rash    Current Outpatient Medications on File Prior to Visit  Medication Sig  . acetaminophen (TYLENOL) 500 MG tablet Take 500 mg by mouth every 6 (six) hours as needed.  . Ascorbic Acid (VITAMIN C) 1000 MG tablet Take 1,000 mg by mouth daily.   Marland Kitchen aspirin EC 81 MG tablet Take 81 mg by mouth daily.   . bisoprolol-hydrochlorothiazide (ZIAC) 2.5-6.25 MG tablet Take 1 tablet daily for BP (Patient taking differently: 1 tablet daily. )  . budesonide (ENTOCORT EC) 3 MG 24 hr capsule Take 3 mg by mouth daily.  . Calcium Carbonate-Vitamin D (CALCIUM 600+D HIGH POTENCY) 600-400 MG-UNIT tablet Take 1 tablet by mouth daily.  . Cinnamon 500 MG TABS Take 1,000 mg by mouth daily.   . Coenzyme Q10 (COQ10) 200 MG CAPS Take 200 mg by mouth daily.  Marland Kitchen levothyroxine (SYNTHROID, LEVOTHROID) 125 MCG tablet TAKE 1 TABLET BY MOUTH DAILY BEFORE BREAKFAST (Patient taking differently: Take one tablet on Sun, Mon, Wed & Fri and 1/2 tablet all other  days)  . Multiple Vitamins-Minerals (MULTIVITAMIN WITH MINERALS) tablet Take 1 tablet by mouth daily.  . pantoprazole (PROTONIX) 40 MG tablet Take 1 tablet daily for Acid Indigestion & Reflux (Patient taking differently: 2 (two) times daily. Take 1 tablet daily for Acid Indigestion & Reflux)  . Polyethyl Glycol-Propyl Glycol (SYSTANE) 0.4-0.3 % SOLN Place 1 drop into both eyes 2 (two) times daily as needed (for dry eyes).  . Probiotic Product (PROBIOTIC PO) Take 1 capsule by mouth daily.  . simvastatin (ZOCOR) 20 MG tablet TAKE 1 TABLET BY  MOUTH DAILY (Patient taking differently: Take one tablet every other day)  . Biotin w/ Vitamins C & E (HAIR/SKIN/NAILS PO) Take 1 tablet by mouth daily.  . cyclobenzaprine (FLEXERIL) 10 MG tablet Take 1 tablet (10 mg total) by mouth at bedtime.  . Omega-3 Fatty Acids (FISH OIL) 1000 MG CAPS Take 1,000 mg by mouth daily.   No current facility-administered medications on file prior to visit.     ROS: all negative except above.   Physical Exam:  BP 122/76   Pulse 74   Temp 98.6 F (37 C)   Ht 5' 4.5" (1.638 m)   Wt 111 lb 6.4 oz (50.5 kg)   SpO2 97%   BMI 18.83 kg/m   General Appearance: Well nourished, in no apparent distress. conjunctiva no swelling or erythema ENT/Mouth:  Hearing normal.  Neck: Supple Respiratory: Respiratory effort normal Cardio: RRR with no MRGs. Brisk peripheral pulses without edema.  Abdomen: Soft, + BS.  Some suprapubic pressure, bladder non-distented Lymphatics: Non tender without lymphadenopathy.  Musculoskeletal: normal gait.  Skin: Warm, dry without rashes, lesions, ecchymosis.  Neuro: Normal muscle tone, no cerebellar symptoms. Psych: Awake and oriented X 3, normal affect, Insight and Judgment appropriate.     Izora Ribas, NP 2:18 PM [facility]

## 2019-04-30 NOTE — Progress Notes (Deleted)
THIS ENCOUNTER IS A VIRTUAL VISIT DUE TO COVID-19 - PATIENT WAS NOT SEEN IN THE OFFICE.  PATIENT HAS CONSENTED TO VIRTUAL VISIT / TELEMEDICINE VISIT   Virtual Visit via {[email or web address]} Note  I connected with [name] on 05/01/2019 by {[email or web address]}.  I verified that I am speaking with the correct person using two identifiers.    I discussed the limitations of evaluation and management by telemedicine and the availability of in person appointments. The patient expressed understanding and agreed to proceed.  History of Present Illness:   Medications  Current Outpatient Medications (Endocrine & Metabolic):  .  budesonide (ENTOCORT EC) 3 MG 24 hr capsule, Take 3 mg by mouth daily. Marland Kitchen  levothyroxine (SYNTHROID, LEVOTHROID) 125 MCG tablet, TAKE 1 TABLET BY MOUTH DAILY BEFORE BREAKFAST (Patient taking differently: Take one tablet on Sun, Mon, Wed & Fri and 1/2 tablet all other days)  Current Outpatient Medications (Cardiovascular):  .  bisoprolol-hydrochlorothiazide (ZIAC) 2.5-6.25 MG tablet, Take 1 tablet daily for BP (Patient taking differently: 1 tablet daily. ) .  simvastatin (ZOCOR) 20 MG tablet, TAKE 1 TABLET BY MOUTH DAILY (Patient taking differently: Take one tablet every other day)   Current Outpatient Medications (Analgesics):  .  acetaminophen (TYLENOL) 500 MG tablet, Take 500 mg by mouth every 6 (six) hours as needed. Marland Kitchen  aspirin EC 81 MG tablet, Take 81 mg by mouth daily.    Current Outpatient Medications (Other):  Marland Kitchen  Ascorbic Acid (VITAMIN C) 1000 MG tablet, Take 1,000 mg by mouth daily.  .  Biotin w/ Vitamins C & E (HAIR/SKIN/NAILS PO), Take 1 tablet by mouth daily. .  Calcium Carbonate-Vitamin D (CALCIUM 600+D HIGH POTENCY) 600-400 MG-UNIT tablet, Take 1 tablet by mouth daily. .  Cinnamon 500 MG TABS, Take 1,000 mg by mouth daily.  .  Coenzyme Q10 (COQ10) 200 MG CAPS, Take 200 mg by mouth daily. .  cyclobenzaprine (FLEXERIL) 10 MG tablet, Take  1 tablet (10 mg total) by mouth at bedtime. .  Multiple Vitamins-Minerals (MULTIVITAMIN WITH MINERALS) tablet, Take 1 tablet by mouth daily. .  Omega-3 Fatty Acids (FISH OIL) 1000 MG CAPS, Take 1,000 mg by mouth daily. .  pantoprazole (PROTONIX) 40 MG tablet, Take 1 tablet daily for Acid Indigestion & Reflux .  Polyethyl Glycol-Propyl Glycol (SYSTANE) 0.4-0.3 % SOLN, Place 1 drop into both eyes 2 (two) times daily as needed (for dry eyes). .  Probiotic Product (PROBIOTIC PO), Take 1 capsule by mouth daily.  Problem list She has Essential hypertension; Hyperlipidemia; Hypothyroid; GERD; Asthma; DJD (degenerative joint disease); Vitamin D deficiency; Osteopenia; Allergy; Anemia; Medication management; Colitis; Encounter for Medicare annual wellness exam; Spondylolisthesis at L4-L5 level; Otalgia of both ears; and Vertigo on their problem list.   Observations/Objective: General Appearance: Well nourished well developed, in no apparent distress.  Eyes: conjunctiva no swelling or erythema ENT/Mouth: No hoarseness, No cough for duration of visit.  Neck: Supple  Respiratory: Respiratory effort normal, normal rate, no retractions or distress.   Cardio: Appears well-perfused, noncyanotic Musculoskeletal: no obvious deformity Skin: visible skin without rashes, ecchymosis, erythema Neuro: Awake and oriented X 3,  Psych:  normal affect, Insight and Judgment appropriate.   Assessment and Plan: Would like antibody testing We discussed the cons of the test at this time, false positives, false negatives We discussed that the antibody result will not change management, there is no known immunity incurred by a positive result, she will still be encouraged to hand wash, wear a mask and socially distance.  She will check with her insurance.    Follow Up Instructions:  I discussed the assessment and treatment plan with the patient. The patient was provided an opportunity to ask questions and all were  answered. The patient agreed with the plan and demonstrated an understanding of the instructions.   The patient was advised to call back or seek an in-person evaluation if the symptoms worsen or if the condition fails to improve as anticipated.  I provided *** minutes of non-face-to-face time during this encounter.   Vicie Mutters, PA-C

## 2019-04-30 NOTE — Patient Instructions (Addendum)
If urine culture is negative, try myrbetriq - 1 tab daily     Urinary Tract Infection, Adult  A urinary tract infection (UTI) is an infection of any part of the urinary tract. The urinary tract includes the kidneys, ureters, bladder, and urethra. These organs make, store, and get rid of urine in the body. Your health care provider may use other names to describe the infection. An upper UTI affects the ureters and kidneys (pyelonephritis). A lower UTI affects the bladder (cystitis) and urethra (urethritis). What are the causes? Most urinary tract infections are caused by bacteria in your genital area, around the entrance to your urinary tract (urethra). These bacteria grow and cause inflammation of your urinary tract. What increases the risk? You are more likely to develop this condition if:  You have a urinary catheter that stays in place (indwelling).  You are not able to control when you urinate or have a bowel movement (you have incontinence).  You are female and you: ? Use a spermicide or diaphragm for birth control. ? Have low estrogen levels. ? Are pregnant.  You have certain genes that increase your risk (genetics).  You are sexually active.  You take antibiotic medicines.  You have a condition that causes your flow of urine to slow down, such as: ? An enlarged prostate, if you are female. ? Blockage in your urethra (stricture). ? A kidney stone. ? A nerve condition that affects your bladder control (neurogenic bladder). ? Not getting enough to drink, or not urinating often.  You have certain medical conditions, such as: ? Diabetes. ? A weak disease-fighting system (immunesystem). ? Sickle cell disease. ? Gout. ? Spinal cord injury. What are the signs or symptoms? Symptoms of this condition include:  Needing to urinate right away (urgently).  Frequent urination or passing small amounts of urine frequently.  Pain or burning with urination.  Blood in the urine.   Urine that smells bad or unusual.  Trouble urinating.  Cloudy urine.  Vaginal discharge, if you are female.  Pain in the abdomen or the lower back. You may also have:  Vomiting or a decreased appetite.  Confusion.  Irritability or tiredness.  A fever.  Diarrhea. The first symptom in older adults may be confusion. In some cases, they may not have any symptoms until the infection has worsened. How is this diagnosed? This condition is diagnosed based on your medical history and a physical exam. You may also have other tests, including:  Urine tests.  Blood tests.  Tests for sexually transmitted infections (STIs). If you have had more than one UTI, a cystoscopy or imaging studies may be done to determine the cause of the infections. How is this treated? Treatment for this condition includes:  Antibiotic medicine.  Over-the-counter medicines to treat discomfort.  Drinking enough water to stay hydrated. If you have frequent infections or have other conditions such as a kidney stone, you may need to see a health care provider who specializes in the urinary tract (urologist). In rare cases, urinary tract infections can cause sepsis. Sepsis is a life-threatening condition that occurs when the body responds to an infection. Sepsis is treated in the hospital with IV antibiotics, fluids, and other medicines. Follow these instructions at home:  Medicines  Take over-the-counter and prescription medicines only as told by your health care provider.  If you were prescribed an antibiotic medicine, take it as told by your health care provider. Do not stop using the antibiotic even if you start  to feel better. General instructions  Make sure you: ? Empty your bladder often and completely. Do not hold urine for long periods of time. ? Empty your bladder after sex. ? Wipe from front to back after a bowel movement if you are female. Use each tissue one time when you wipe.  Drink  enough fluid to keep your urine pale yellow.  Keep all follow-up visits as told by your health care provider. This is important. Contact a health care provider if:  Your symptoms do not get better after 1-2 days.  Your symptoms go away and then return. Get help right away if you have:  Severe pain in your back or your lower abdomen.  A fever.  Nausea or vomiting. Summary  A urinary tract infection (UTI) is an infection of any part of the urinary tract, which includes the kidneys, ureters, bladder, and urethra.  Most urinary tract infections are caused by bacteria in your genital area, around the entrance to your urinary tract (urethra).  Treatment for this condition often includes antibiotic medicines.  If you were prescribed an antibiotic medicine, take it as told by your health care provider. Do not stop using the antibiotic even if you start to feel better.  Keep all follow-up visits as told by your health care provider. This is important. This information is not intended to replace advice given to you by your health care provider. Make sure you discuss any questions you have with your health care provider. Document Released: 08/15/2005 Document Revised: 05/15/2018 Document Reviewed: 05/15/2018 Elsevier Interactive Patient Education  2019 [place].    Pelvic Organ Prolapse Pelvic organ prolapse is the stretching, bulging, or dropping of pelvic organs into an abnormal position. It happens when the muscles and tissues that surround and support pelvic structures become weak or stretched. Pelvic organ prolapse can involve the:  Vagina (vaginal prolapse).  Uterus (uterine prolapse).  Bladder (cystocele).  Rectum (rectocele).  Intestines (enterocele). When organs other than the vagina are involved, they often bulge into the vagina or protrude from the vagina, depending on how severe the prolapse is. What are the causes? This condition may be caused by:  Pregnancy,  labor, and childbirth.  Past pelvic surgery.  Decreased production of the hormone estrogen associated with menopause.  Consistently lifting more than 50 lb (23 kg).  Obesity.  Long-term inability to pass stool (chronic constipation).  A cough that lasts a long time (chronic).  Buildup of fluid in the abdomen due to certain diseases and other conditions. What are the signs or symptoms? Symptoms of this condition include:  Passing a little urine (loss of bladder control) when you cough, sneeze, strain, and exercise (stress incontinence). This may be worse immediately after childbirth. It may gradually improve over time.  Feeling pressure in your pelvis or vagina. This pressure may increase when you cough or when you are passing stool.  A bulge that protrudes from the opening of your vagina.  Difficulty passing urine or stool.  Pain in your lower back.  Pain, discomfort, or disinterest in sex.  Repeated bladder infections (urinary tract infections).  Difficulty inserting a tampon. In some people, this condition causes no symptoms. How is this diagnosed? This condition may be diagnosed based on a vaginal and rectal exam. During the exam, you may be asked to cough and strain while you are lying down, sitting, and standing up. Your health care provider will determine if other tests are required, such as bladder function tests. How is  this treated? Treatment for this condition may depend on your symptoms. Treatment may include:  Lifestyle changes, such as changes to your diet.  Emptying your bladder at scheduled times (bladder training therapy). This can help reduce or avoid urinary incontinence.  Estrogen. Estrogen may help mild prolapse by increasing the strength and tone of pelvic floor muscles.  Kegel exercises. These may help mild cases of prolapse by strengthening and tightening the muscles of the pelvic floor.  A soft, flexible device that helps support the vaginal  walls and keep pelvic organs in place (pessary). This is inserted into your vagina by your health care provider.  Surgery. This is often the only form of treatment for severe prolapse. Follow these instructions at home:  Avoid drinking beverages that contain caffeine or alcohol.  Increase your intake of high-fiber foods. This can help decrease constipation and straining during bowel movements.  Lose weight if recommended by your health care provider.  Wear a sanitary pad or adult diapers if you have urinary incontinence.  Avoid heavy lifting and straining with exercise and work. Do not hold your breath when you perform mild to moderate lifting and exercise activities. Limit your activities as directed by your health care provider.  Do Kegel exercises as directed by your health care provider. To do this: ? Squeeze your pelvic floor muscles tight. You should feel a tight lift in your rectal area and a tightness in your vaginal area. Keep your stomach, buttocks, and legs relaxed. ? Hold the muscles tight for up to 10 seconds. ? Relax your muscles. ? Repeat this exercise 50 times a day, or as many times as told by your health care provider. Continue to do this exercise for at least 4-6 weeks, or for as long as told by your health care provider.  Take over-the-counter and prescription medicines only as told by your health care provider.  If you have a pessary, take care of it as told by your health care provider.  Keep all follow-up visits as told by your health care provider. This is important. Contact a health care provider if you:  Have symptoms that interfere with your daily activities or sex life.  Need medicine to help with the discomfort.  Notice bleeding from your vagina that is not related to your period.  Have a fever.  Have pain or bleeding when you urinate.  Have bleeding when you pass stool.  Pass urine when you have sex.  Have chronic constipation.  Have a pessary  that falls out.  Have bad smelling vaginal discharge.  Have an unusual, low pain in your abdomen. Summary  Pelvic organ prolapse is the stretching, bulging, or dropping of pelvic organs into an abnormal position. It happens when the muscles and tissues that surround and support pelvic structures become weak or stretched.  When organs other than the vagina are involved, they often bulge into the vagina or protrude from the vagina, depending on how severe the prolapse is.  In most cases, this condition needs to be treated only if it produces symptoms. Treatment may include lifestyle changes, estrogen, Kegel exercises, pessary insertion, or surgery.  Avoid heavy lifting and straining with exercise and work. Do not hold your breath when you perform mild to moderate lifting and exercise activities. Limit your activities as directed by your health care provider. This information is not intended to replace advice given to you by your health care provider. Make sure you discuss any questions you have with your health care  provider. Document Released: 06/02/2014 Document Revised: 11/27/2017 Document Reviewed: 11/27/2017 Elsevier Interactive Patient Education  2019 [employer].

## 2019-05-01 ENCOUNTER — Ambulatory Visit: Payer: Medicare Other | Admitting: Physician Assistant

## 2019-05-02 LAB — URINALYSIS W MICROSCOPIC + REFLEX CULTURE
Bacteria, UA: NONE SEEN /HPF
Bilirubin Urine: NEGATIVE
Glucose, UA: NEGATIVE
Hgb urine dipstick: NEGATIVE
Hyaline Cast: NONE SEEN /LPF
Ketones, ur: NEGATIVE
Nitrites, Initial: NEGATIVE
Protein, ur: NEGATIVE
RBC / HPF: NONE SEEN /HPF (ref 0–2)
Specific Gravity, Urine: 1.006 (ref 1.001–1.03)
Squamous Epithelial / HPF: NONE SEEN /HPF (ref <=5)
WBC, UA: NONE SEEN /HPF (ref 0–5)
pH: 7 (ref 5.0–8.0)

## 2019-05-02 LAB — URINE CULTURE
MICRO NUMBER:: 564152
SPECIMEN QUALITY:: ADEQUATE

## 2019-05-02 LAB — CULTURE INDICATED

## 2019-05-11 DIAGNOSIS — M542 Cervicalgia: Secondary | ICD-10-CM | POA: Diagnosis not present

## 2019-05-12 DIAGNOSIS — M542 Cervicalgia: Secondary | ICD-10-CM | POA: Diagnosis not present

## 2019-05-13 DIAGNOSIS — D125 Benign neoplasm of sigmoid colon: Secondary | ICD-10-CM | POA: Diagnosis not present

## 2019-05-13 DIAGNOSIS — K2270 Barrett's esophagus without dysplasia: Secondary | ICD-10-CM | POA: Diagnosis not present

## 2019-05-13 DIAGNOSIS — K219 Gastro-esophageal reflux disease without esophagitis: Secondary | ICD-10-CM | POA: Diagnosis not present

## 2019-05-13 DIAGNOSIS — K52831 Collagenous colitis: Secondary | ICD-10-CM | POA: Diagnosis not present

## 2019-05-13 DIAGNOSIS — K523 Indeterminate colitis: Secondary | ICD-10-CM | POA: Diagnosis not present

## 2019-05-13 LAB — HM COLONOSCOPY

## 2019-05-15 DIAGNOSIS — M542 Cervicalgia: Secondary | ICD-10-CM | POA: Diagnosis not present

## 2019-05-15 DIAGNOSIS — K219 Gastro-esophageal reflux disease without esophagitis: Secondary | ICD-10-CM | POA: Diagnosis not present

## 2019-05-15 DIAGNOSIS — D125 Benign neoplasm of sigmoid colon: Secondary | ICD-10-CM | POA: Diagnosis not present

## 2019-05-15 DIAGNOSIS — K52831 Collagenous colitis: Secondary | ICD-10-CM | POA: Diagnosis not present

## 2019-05-18 DIAGNOSIS — M542 Cervicalgia: Secondary | ICD-10-CM | POA: Diagnosis not present

## 2019-06-02 ENCOUNTER — Encounter: Payer: Self-pay | Admitting: *Deleted

## 2019-06-15 DIAGNOSIS — I83813 Varicose veins of bilateral lower extremities with pain: Secondary | ICD-10-CM | POA: Diagnosis not present

## 2019-06-15 DIAGNOSIS — M79604 Pain in right leg: Secondary | ICD-10-CM | POA: Diagnosis not present

## 2019-06-15 DIAGNOSIS — M79605 Pain in left leg: Secondary | ICD-10-CM | POA: Diagnosis not present

## 2019-06-17 DIAGNOSIS — D1801 Hemangioma of skin and subcutaneous tissue: Secondary | ICD-10-CM | POA: Diagnosis not present

## 2019-06-17 DIAGNOSIS — L821 Other seborrheic keratosis: Secondary | ICD-10-CM | POA: Diagnosis not present

## 2019-06-17 DIAGNOSIS — D692 Other nonthrombocytopenic purpura: Secondary | ICD-10-CM | POA: Diagnosis not present

## 2019-06-17 DIAGNOSIS — D225 Melanocytic nevi of trunk: Secondary | ICD-10-CM | POA: Diagnosis not present

## 2019-06-17 DIAGNOSIS — L820 Inflamed seborrheic keratosis: Secondary | ICD-10-CM | POA: Diagnosis not present

## 2019-06-17 DIAGNOSIS — Z85828 Personal history of other malignant neoplasm of skin: Secondary | ICD-10-CM | POA: Diagnosis not present

## 2019-06-18 DIAGNOSIS — M47812 Spondylosis without myelopathy or radiculopathy, cervical region: Secondary | ICD-10-CM | POA: Diagnosis not present

## 2019-06-18 DIAGNOSIS — M810 Age-related osteoporosis without current pathological fracture: Secondary | ICD-10-CM | POA: Diagnosis not present

## 2019-06-19 DIAGNOSIS — M5416 Radiculopathy, lumbar region: Secondary | ICD-10-CM | POA: Diagnosis not present

## 2019-06-25 DIAGNOSIS — M5136 Other intervertebral disc degeneration, lumbar region: Secondary | ICD-10-CM | POA: Diagnosis not present

## 2019-07-06 NOTE — Progress Notes (Signed)
Assessment and Plan:  Debbie Houston was seen today for nasal congestion, ear pain and sinus problem.  Diagnoses and all orders for this visit:  Pansinusitis, unspecified chronicity Continue using NetiPot BID, use warm bottled or distilled water. -     azithromycin (ZITHROMAX Z-PAK) 250 MG tablet; Take 2 tablets (500 mg) on  Day 1,  followed by 1 tablet (250 mg) once daily on Days 2 through 5.  Cerumen impaction right ear Manual removal Discussed ear hygiene Continue to monitor  Otalgia of both ears Acute otitis media with effusion -Azithromyacin Continue zyrtec nightly, consider changing antihistamine as discussed in appointment Printed education provided Dizziness should resolve after treatment  Gastroesophageal reflux disease without esophagitis Continue pantropazole 42m, change timing to day  famotidine (PEPCID) 20 MG tablet; Take 1 tablet (20 mg total) by mouth at bedtime. Continue to monitor dietary intake and avoid triggers Increase water intake  Essential hypertension Well controlled today Taking Ziac 2.03/24/25 daily If dizziness persists after treatment consider hypotension?   Patient agrees with plan of care.  Contact office with any new or worsening symptoms.    Discussed med's effects and SE's.   Over 30 minutes of exam, counseling, chart review, and critical decision making was performed.   Future Appointments  Date Time Provider [place] 08/03/2019  2:30 PM MUnk Pinto MD [facility] None  04/11/2020  3:00 PM CVicie Mutters PA-C [facility] None    ------------------------------------------------------------------------------------------------------------------   HPI [id number].o.female presents for evaluation of sinus congestion.  Reports this has been going on for the past three weeks.  She is using a neti pot twice a day, nasocort and zyrtec.  She is concerned because she is not improving.  Reports over the past three week there is an increase in  pressure in her ears and sinuses.  She reports bilateral otalgia.  She is having dizziness that is random.  It is intermittent and comes in waves.  She has been drinking lots of water to help her throat.  She has a sore throat that is intermittent and can feel drainage in the back of her throat.  She also reports some hoarseness that is intermittent in nature.  She is taking pantoprazole 479mnightly for her refulx.  Reports she still has reflux at night and has to take tums or malox in the middle of the night.  She also reports increased symptoms during the day.  She reports consistently having to take OTC products to help.  Denies any nausea vomiting, diarrhea, constipation, headaches, ear popping, sore throat, cough, fever, shortness of breath or chest pains.  Past Medical History:  Diagnosis Date  . Arthritis   . Benign labile hypertension   . Cancer ([place]   skin cancer on face  . Colitis   . DJD (degenerative joint disease)   . Endometrial polyp   . GERD (gastroesophageal reflux disease)   . History of basal cell carcinoma excision   . History of esophagitis   . HOH (hard of hearing)    right ear better than left  -- wears no aides  . Hyperlipidemia   . Hypothyroidism   . Osteopenia   . Prediabetes   . Vitamin D deficiency   . Wears glasses      Allergies  Allergen Reactions  . Fosamax [Alendronate Sodium] Other (See Comments)    Reaction:  GI upset   . Singulair [Montelukast Sodium] Other (See Comments)    Reaction:  Makes pt jittery  . Wellbutrin [Bupropion]  Hives  . Sulfa Antibiotics Rash    Current Outpatient Medications on File Prior to Visit  Medication Sig  . acetaminophen (TYLENOL) 500 MG tablet Take 500 mg by mouth every 6 (six) hours as needed.  . Ascorbic Acid (VITAMIN C) 1000 MG tablet Take 1,000 mg by mouth daily.   Marland Kitchen aspirin EC 81 MG tablet Take 81 mg by mouth daily.   . bisoprolol-hydrochlorothiazide (ZIAC) 2.5-6.25 MG tablet Take 1 tablet daily for BP  (Patient taking differently: 1 tablet daily. )  . budesonide (ENTOCORT EC) 3 MG 24 hr capsule Take 3 mg by mouth daily.  . Calcium Carbonate-Vitamin D (CALCIUM 600+D HIGH POTENCY) 600-400 MG-UNIT tablet Take 1 tablet by mouth daily.  . Cinnamon 500 MG TABS Take 1,000 mg by mouth daily.   . Coenzyme Q10 (COQ10) 200 MG CAPS Take 200 mg by mouth daily.  . Multiple Vitamins-Minerals (MULTIVITAMIN WITH MINERALS) tablet Take 1 tablet by mouth daily.  . pantoprazole (PROTONIX) 40 MG tablet Take 1 tablet daily for Acid Indigestion & Reflux  . Polyethyl Glycol-Propyl Glycol (SYSTANE) 0.4-0.3 % SOLN Place 1 drop into both eyes 2 (two) times daily as needed (for dry eyes).  . Probiotic Product (PROBIOTIC PO) Take 1 capsule by mouth daily.  . simvastatin (ZOCOR) 20 MG tablet TAKE 1 TABLET BY MOUTH DAILY (Patient taking differently: Take one tablet every other day)   No current facility-administered medications on file prior to visit.     ROS: all negative except above.   Physical Exam:  BP 110/72   Pulse 62   Temp (!) 97.3 F (36.3 C)   Ht 5' 4.5" (1.638 m)   Wt 112 lb 3.2 oz (50.9 kg)   SpO2 97%   BMI 18.96 kg/m   General Appearance: Well nourished, in no apparent distress. Eyes: PERRLA, EOMs, conjunctiva no swelling or erythema Sinuses: Frontal/maxillary tenderness noted ENT/Mouth: Ext aud canals clear on left, Cerumen impaction right., manual removal.  TMs without bulging, white exudate noted behind bilateral TM's.. No erythema, swelling, or exudate on post pharynx.  Tonsils not swollen or erythematous. Hearing normal.  Neck: Supple, thyroid normal.  Respiratory: Respiratory effort normal, BS equal bilaterally without rales, rhonchi, wheezing or stridor.  Cardio: RRR with no MRGs. Brisk peripheral pulses without edema.  Abdomen: Soft, + BS.  Non tender, no guarding, rebound, hernias, masses. Lymphatics: Non tender without lymphadenopathy.  Musculoskeletal: Full ROM, 5/5 strength, normal  gait.  Skin: Warm, dry without rashes, lesions, ecchymosis.  Neuro: Cranial nerves intact. Normal muscle tone, no cerebellar symptoms. Sensation intact.  Psych: Awake and oriented X 3, normal affect, Insight and Judgment appropriate.    Procedure: Manual Cerumen Removal Verbal consent from patient obtained Risks/benefits discussed Curette used to remove wax from right ear canal Moderate amount of soft yellow wax removed Patient tolerated well Hearing in right improved Will continue to monitor   Garnet Sierras, NP 1:58 PM [facility]

## 2019-07-07 ENCOUNTER — Other Ambulatory Visit: Payer: Self-pay

## 2019-07-07 ENCOUNTER — Encounter: Payer: Self-pay | Admitting: Adult Health Nurse Practitioner

## 2019-07-07 ENCOUNTER — Ambulatory Visit (INDEPENDENT_AMBULATORY_CARE_PROVIDER_SITE_OTHER): Payer: Medicare Other | Admitting: Adult Health Nurse Practitioner

## 2019-07-07 VITALS — BP 110/72 | HR 62 | Temp 97.3°F | Ht 64.5 in | Wt 112.2 lb

## 2019-07-07 DIAGNOSIS — H65199 Other acute nonsuppurative otitis media, unspecified ear: Secondary | ICD-10-CM

## 2019-07-07 DIAGNOSIS — H9203 Otalgia, bilateral: Secondary | ICD-10-CM

## 2019-07-07 DIAGNOSIS — J324 Chronic pansinusitis: Secondary | ICD-10-CM

## 2019-07-07 DIAGNOSIS — I10 Essential (primary) hypertension: Secondary | ICD-10-CM | POA: Diagnosis not present

## 2019-07-07 DIAGNOSIS — K219 Gastro-esophageal reflux disease without esophagitis: Secondary | ICD-10-CM | POA: Diagnosis not present

## 2019-07-07 DIAGNOSIS — H6121 Impacted cerumen, right ear: Secondary | ICD-10-CM

## 2019-07-07 MED ORDER — LEVOTHYROXINE SODIUM 125 MCG PO TABS: ORAL_TABLET | ORAL | 1 refills | Status: DC

## 2019-07-07 MED ORDER — FAMOTIDINE 20 MG PO TABS: 20.0000 mg | ORAL_TABLET | Freq: Two times a day (BID) | ORAL | 1 refills | Status: DC

## 2019-07-07 MED ORDER — FAMOTIDINE 20 MG PO TABS: ORAL_TABLET | ORAL | 1 refills | Status: DC

## 2019-07-07 MED ORDER — AZITHROMYCIN 250 MG PO TABS: ORAL_TABLET | ORAL | 0 refills | Status: AC

## 2019-07-07 NOTE — Patient Instructions (Addendum)
    Allergy Symptoms / Runny Nose: Chose one  Zyrtec / Cetirizine Take 56m by mouth May cause drowsiness, take nightly Be sure to drink plenty of water If this is not effective, try Xyzal or Allegra  OR  Xyzal / Levocetirazine  Take 518mby mouth May cause drowsiness, take nightly Be sure to drink plenty of water If this is not effective try Allegra or Zyrtec  OR   Allegra / fexofenadine Take 1803my mouth daily If this is not effective try Zyrtec or Xyzal   *If you battle with chronic allergies you may need to change the antihistamine you currently use to find most effective.   You may continue taking Singular with one of the above antihistamines.   For your reflux let change your regiment  Take the (Pantropazole) Protonix in the morning and start taking famotidine (Pepsid) at night.   Latissimus dorsi:  Look up strengthening exercises for this to help with your back.

## 2019-07-16 ENCOUNTER — Other Ambulatory Visit: Payer: Self-pay | Admitting: Physician Assistant

## 2019-07-23 ENCOUNTER — Other Ambulatory Visit: Payer: Self-pay

## 2019-07-23 ENCOUNTER — Encounter: Payer: Self-pay | Admitting: Adult Health Nurse Practitioner

## 2019-07-23 ENCOUNTER — Ambulatory Visit (INDEPENDENT_AMBULATORY_CARE_PROVIDER_SITE_OTHER): Payer: Medicare Other | Admitting: Adult Health Nurse Practitioner

## 2019-07-23 ENCOUNTER — Other Ambulatory Visit: Payer: Self-pay | Admitting: Adult Health Nurse Practitioner

## 2019-07-23 VITALS — BP 106/68 | HR 73 | Temp 97.3°F | Wt 113.0 lb

## 2019-07-23 DIAGNOSIS — I952 Hypotension due to drugs: Secondary | ICD-10-CM

## 2019-07-23 DIAGNOSIS — K219 Gastro-esophageal reflux disease without esophagitis: Secondary | ICD-10-CM

## 2019-07-23 DIAGNOSIS — H65116 Acute and subacute allergic otitis media (mucoid) (sanguinous) (serous), recurrent, bilateral: Secondary | ICD-10-CM

## 2019-07-23 DIAGNOSIS — Z79899 Other long term (current) drug therapy: Secondary | ICD-10-CM | POA: Diagnosis not present

## 2019-07-23 DIAGNOSIS — H938X3 Other specified disorders of ear, bilateral: Secondary | ICD-10-CM

## 2019-07-23 DIAGNOSIS — R490 Dysphonia: Secondary | ICD-10-CM

## 2019-07-23 MED ORDER — OMEPRAZOLE 20 MG PO CPDR: 20.0000 mg | DELAYED_RELEASE_CAPSULE | Freq: Two times a day (BID) | ORAL | 1 refills | Status: DC

## 2019-07-23 NOTE — Progress Notes (Signed)
Assessment and Plan:  There are no diagnoses linked to this encounter.  Rejina was seen today for acute visit.  Diagnoses and all orders for this visit:  Gastroesophageal reflux disease without esophagitis STOP protonix and prilosec at this time -     omeprazole (PRILOSEC) 20 MG capsule; Take 1 capsule (20 mg total) by mouth 2 (two) times daily. Start with once a day in the morning, if evening breathrough symptoms start evening dose. 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 Continue to monitor symptoms once reflux managed.  Medication management Continued    Further disposition pending results of labs. Discussed med's effects and SE's.   Over 30 minutes of exam, counseling, chart review, and critical decision making was performed.   Future Appointments  Date Time Provider [place]  08/03/2019  2:30 PM Unk Pinto, MD [facility] None  04/11/2020  3:00 PM Vicie Mutters, PA-C [facility] None    ------------------------------------------------------------------------------------------------------------------   HPI 78 y.o.female presents for evaluation of hoarseness and  that has been intermittent but increasing and now consistent.  Last visit we added pepsid 78m at bedtime to the pantoprazole she takes daily.  She reports she did not notice a difference.  She can feel the reflux in her epigastric region and this is increased at night.  She monitors her dietary intake and avoids triggers.  She reports she is having  frequent burping as well.   She is having difficulty swallowing, mainly with pills feeling like they are getting stuck.   Last visit was on 8/18 for pansinuitis and ear fullness.  She changed her anthistamine from zyrtec to allegra which she reports helped some and completed azithromycin course.  She reports some improvement but she feels like her ears are full.  She has decreased hearing, which is baseline, but admits it is getting worse.  She denies any headaches, change in her vision, ringing in her ears, nasal congestion, cough or cough after eating or drinking, chest pains, shortness of breath, nausea vomiting, abdominal pain, constipation or diarrhea.   Past Medical History:  Diagnosis Date  . Arthritis   . Benign labile hypertension   . [place] cancer [place]  . Colitis   . DJD (degenerative joint disease)   . Endometrial polyp   . GERD (gastroesophageal reflux disease)   . History of basal cell carcinoma excision   . History of esophagitis   . HOH (hard of hearing)    right ear better than left  -- wears no aides  . Hyperlipidemia   . Hypothyroidism   . Osteopenia   . Prediabetes   . Vitamin D deficiency   . Wears glasses      Allergies  Allergen Reactions  . Fosamax [Alendronate Sodium] Other (See Comments)    Reaction:  GI upset   . Singulair [Montelukast Sodium] Other (See Comments)    Reaction:  Makes pt jittery  . Wellbutrin [Bupropion] Hives  . Sulfa Antibiotics Rash    Current Outpatient Medications on File Prior to Visit  Medication  Sig  . acetaminophen (TYLENOL) 500 MG tablet Take 500 mg by mouth every 6 (six) hours as needed.  . Ascorbic Acid (VITAMIN C) 1000 MG tablet Take 1,000 mg by mouth daily.   Marland Kitchen aspirin EC 81 MG tablet Take 81 mg by mouth daily.   . bisoprolol-hydrochlorothiazide (ZIAC) 2.5-6.25 MG tablet Take 1 tablet daily for BP (Patient taking differently: 1 tablet daily. )  . budesonide (ENTOCORT EC) 3 MG 24 hr capsule Take 3 mg by  mouth daily.  . Calcium Carbonate-Vitamin D (CALCIUM 600+D HIGH POTENCY) 600-400 MG-UNIT tablet Take 1 tablet by mouth daily.  . Cinnamon 500 MG TABS Take 1,000 mg by mouth daily.   . Coenzyme Q10 (COQ10) 200 MG CAPS Take 200 mg by mouth daily.  . famotidine (PEPCID) 20 MG tablet Take one tablet by mouth daily before bed.  . levothyroxine (SYNTHROID) 125 MCG tablet Take one tablet on Sun, Mon, Wed & Fri and 1/2 tablet all other days  . Multiple Vitamins-Minerals (MULTIVITAMIN WITH MINERALS) tablet Take 1 tablet by mouth daily.  . pantoprazole (PROTONIX) 40 MG tablet Take 1 tablet daily for Acid Indigestion & Reflux  . Polyethyl Glycol-Propyl Glycol (SYSTANE) 0.4-0.3 % SOLN Place 1 drop into both eyes 2 (two) times daily as needed (for dry eyes).  . Probiotic Product (PROBIOTIC PO) Take 1 capsule by mouth daily.  . simvastatin (ZOCOR) 20 MG tablet TAKE 1 TABLET BY MOUTH DAILY   No current facility-administered medications on file prior to visit.     ROS: all negative except above.   Physical Exam:  BP 106/68   Pulse 73   Temp (!) 97.3 F (36.3 C)   Wt 113 lb (51.3 kg)   SpO2 96%   BMI 19.10 kg/m   General Appearance: Well nourished, in no apparent distress. Eyes: PERRLA, EOMs, conjunctiva no swelling or erythema Sinuses: No Frontal/maxillary tenderness ENT/Mouth: Ext aud canals clear, TMs without erythema, bulging. Right TM serous noted, Left TM serous noted with tympanosclerosis noted. No erythema, swelling, or exudate on post pharynx  Tonsils not swollen or erythematous. Hearing normal.  Neck: Supple, thyroid normal.  Respiratory: Respiratory effort normal, BS equal bilaterally without rales, rhonchi, wheezing or stridor.  Cardio: RRR with no MRGs. Brisk peripheral pulses without edema.  Abdomen: Soft, + BS.  Non tender, no guarding, rebound, hernias, masses. Lymphatics: Non tender without lymphadenopathy.  Musculoskeletal: Full ROM, 5/5 strength, normal gait.  Skin: Warm, dry  without rashes, lesions, ecchymosis.  Neuro: Cranial nerves intact. Normal muscle tone, no cerebellar symptoms. Sensation intact.  Psych: Awake and oriented X 3, normal affect, Insight and Judgment appropriate.     Garnet Sierras, NP 1:03 PM [facility]

## 2019-07-23 NOTE — Patient Instructions (Addendum)
   We have sent in Prilosec (Omeprazole) 55m.  Take this in the morning.  Stop Protonix and Pepsid at this time.  You may also take a second dose at bedtime.  We are going to place a referral for ENT evaluation.  Consider taking half of your blood pressure medication.  Monitor your blood pressure twice a day and write it down.  Goal for you is 130/90 or less.  If you have an elevated blood pressure reading recheck 3104m later.  If this goes back into normal range it is ok.  If you are still having dizzy symptoms with half tablet AND low blood pressure readings please let usKoreanow.  We may need to stop or change your medication.

## 2019-07-28 ENCOUNTER — Ambulatory Visit: Payer: Medicare Other | Admitting: Internal Medicine

## 2019-07-31 DIAGNOSIS — I8311 Varicose veins of right lower extremity with inflammation: Secondary | ICD-10-CM | POA: Diagnosis not present

## 2019-07-31 DIAGNOSIS — I8312 Varicose veins of left lower extremity with inflammation: Secondary | ICD-10-CM | POA: Diagnosis not present

## 2019-08-02 ENCOUNTER — Encounter: Payer: Self-pay | Admitting: Internal Medicine

## 2019-08-02 NOTE — Progress Notes (Signed)
History of Present Illness:      This very nice 78 y.o. MWF presents for 3 month follow up with HTN, HLD, Pre-Diabetes, Hypothyroidism and Vitamin D Deficiency.       Patient's GERD is controlled with diet & her meds. Patient is followed by Dr Watt Climes / [facility] with a dx/o Collagenous Colitis. Colonoscopy on 05/13/2019 was Normal .       In Jan 2020 , patient had a L4-L5 decompression / Stabilization of Spondylolisthesis by Dr Ellene Route for lumbar radiculopathy and neurogenic claudication.       Patient is treated for HTN since 2013 & BP has been controlled at home. Today's BP is slighly low and she does report occasional postural lightheadedness.  104/62. Patient has had no complaints of any cardiac type chest pain, palpitations, dyspnea / orthopnea / PND, dizziness, claudication, or dependent edema.      Hyperlipidemia is near controlled with diet & Simvastatin. Patient denies myalgias or other med SE's. Last Lipids were near goal: Lab Results  Component Value Date   CHOL 186 04/07/2019   HDL 65 04/07/2019   LDLCALC 102 (H) 04/07/2019   TRIG 92 04/07/2019   CHOLHDL 2.9 04/07/2019       Also, the patient has history of PreDiabetes and has had no symptoms of reactive hypoglycemia, diabetic polys, paresthesias or visual blurring.  A1c was 5.7% in May 2019 and last A1c was Normal & at goal: Lab Results  Component Value Date   HGBA1C 5.3 12/16/2018       Patient has Hypothyroidism and has been on Thyroid Replacement since 1990.          Further, the patient also has history of Vitamin D Deficiency and supplements vitamin D without any suspected side-effects. Last vitamin D was at goal: Lab Results  Component Value Date   VD25OH 66 04/07/2019   Current Outpatient Medications on File Prior to Visit  Medication Sig  . acetaminophen (TYLENOL) 500 MG tablet Take 500 mg by mouth every 6 (six) hours as needed.  . Ascorbic Acid (VITAMIN C) 1000 MG tablet Take 1,000 mg by mouth daily.   Marland Kitchen  aspirin EC 81 MG tablet Take 81 mg by mouth daily.   . bisoprolol-hydrochlorothiazide (ZIAC) 2.5-6.25 MG tablet Take 1 tablet daily for BP (Patient taking differently: 1 tablet daily. )  . budesonide (ENTOCORT EC) 3 MG 24 hr capsule Take 3 mg by mouth daily.  . Calcium Carbonate-Vitamin D (CALCIUM 600+D HIGH POTENCY) 600-400 MG-UNIT tablet Take 1 tablet by mouth daily.  . Cinnamon 500 MG TABS Take 1,000 mg by mouth daily.   . Coenzyme Q10 (COQ10) 200 MG CAPS Take 200 mg by mouth daily.  . famotidine (PEPCID) 20 MG tablet Take one tablet by mouth daily before bed.  . levothyroxine (SYNTHROID) 125 MCG tablet Take one tablet on Sun, Mon, Wed & Fri and 1/2 tablet all other days  . Multiple Vitamins-Minerals (MULTIVITAMIN WITH MINERALS) tablet Take 1 tablet by mouth daily.  Marland Kitchen OVER THE COUNTER MEDICATION Vein Health 1 tablet daily.  . pantoprazole (PROTONIX) 40 MG tablet Take 1 tablet daily for Acid Indigestion & Reflux  . Polyethyl Glycol-Propyl Glycol (SYSTANE) 0.4-0.3 % SOLN Place 1 drop into both eyes 2 (two) times daily as needed (for dry eyes).  . Probiotic Product (PROBIOTIC PO) Take 1 capsule by mouth daily.  . simvastatin (ZOCOR) 20 MG tablet TAKE 1 TABLET BY MOUTH DAILY   No current facility-administered  medications on file prior to visit.    Allergies  Allergen Reactions  . Fosamax [Alendronate Sodium] Other (See Comments)    Reaction:  GI upset   . Singulair [Montelukast Sodium] Other (See Comments)    Reaction:  Makes pt jittery  . Wellbutrin [Bupropion] Hives  . Sulfa Antibiotics Rash   PMHx:   Past Medical History:  Diagnosis Date  . Arthritis   . Benign labile hypertension   . Cancer ([place])    skin cancer on face  . Colitis   . DJD (degenerative joint disease)   . Endometrial polyp   . GERD (gastroesophageal reflux disease)   . History of basal cell carcinoma excision   . History of esophagitis   . HOH (hard of hearing)    right ear better than left  -- wears no aides   . Hyperlipidemia   . Hypothyroidism   . Osteopenia   . Prediabetes   . Vitamin D deficiency   . Wears glasses    Immunization History  Administered Date(s) Administered  . Influenza, High Dose Seasonal PF 07/23/2014, 08/23/2015, 07/10/2016, 08/27/2017, 08/06/2018  . Pneumococcal Conjugate-13 05/05/2015  . Pneumococcal-Unspecified 08/22/2011  . Tdap 08/22/2011  . Zoster 06/20/2006  . Zoster Recombinat (Shingrix) 09/13/2017, 01/01/2018   Past Surgical History:  Procedure Laterality Date  . DILATATION & CURETTAGE/HYSTEROSCOPY WITH MYOSURE N/A 10/27/2014   Procedure: DILATATION & CURETTAGE/HYSTEROSCOPY WITH MYOSURE;  Surgeon: Darlyn Chamber, MD;  Location: [facility];  Service: Gynecology;  Laterality: N/A;  . DILATION AND CURETTAGE OF UTERUS    . EYE SURGERY     bilateral  . MOHS SURGERY  2005   LEFT NASAL BRIDGE FOR BASAL CELL  . TONSILLECTOMY  age 68   FHx:    Reviewed / unchanged  SHx:    Reviewed / unchanged   Systems Review:  Constitutional: Denies fever, chills, wt changes, headaches, insomnia, fatigue, night sweats, change in appetite. Eyes: Denies redness, blurred vision, diplopia, discharge, itchy, watery eyes.  ENT: Denies discharge, congestion, post nasal drip, epistaxis, sore throat, earache, hearing loss, dental pain, tinnitus, vertigo, sinus pain, snoring.  CV: Denies chest pain, palpitations, irregular heartbeat, syncope, dyspnea, diaphoresis, orthopnea, PND, claudication or edema. Respiratory: denies cough, dyspnea, DOE, pleurisy, hoarseness, laryngitis, wheezing.  Gastrointestinal: Denies dysphagia, odynophagia, heartburn, reflux, water brash, abdominal pain or cramps, nausea, vomiting, bloating, diarrhea, constipation, hematemesis, melena, hematochezia  or hemorrhoids. Genitourinary: Denies dysuria, frequency, urgency, nocturia, hesitancy, discharge, hematuria or flank pain. Musculoskeletal: Denies arthralgias, myalgias, stiffness, jt. swelling,  pain, limping or strain/sprain.  Skin: Denies pruritus, rash, hives, warts, acne, eczema or change in skin lesion(s). Neuro: No weakness, tremor, incoordination, spasms, paresthesia or pain. Psychiatric: Denies confusion, memory loss or sensory loss. Endo: Denies change in weight, skin or hair change.  Heme/Lymph: No excessive bleeding, bruising or enlarged lymph nodes.  Physical Exam  BP 104/62   Pulse 76   Temp (!) 97.4 F (36.3 C)   Resp 16   Ht 5' 4.5" (1.638 m)   Wt 112 lb 12.8 oz (51.2 kg)   BMI 19.06 kg/m   Appears  well nourished, well groomed  and in no distress.  Eyes: PERRLA, EOMs, conjunctiva no swelling or erythema. Sinuses: No frontal/maxillary tenderness ENT/Mouth: EAC's clear, TM's nl w/o erythema, bulging. Nares clear w/o erythema, swelling, exudates. Oropharynx clear without erythema or exudates. Oral hygiene is good. Tongue normal, non obstructing. Hearing intact.  Neck: Supple. Thyroid not palpable. Car 2+/2+ without bruits, nodes or JVD.  Chest: Respirations nl with BS clear & equal w/o rales, rhonchi, wheezing or stridor.  Cor: Heart sounds normal w/ regular rate and rhythm without sig. murmurs, gallops, clicks or rubs. Peripheral pulses normal and equal  without edema.  Abdomen: Soft & bowel sounds normal. Non-tender w/o guarding, rebound, hernias, masses or organomegaly.  Lymphatics: Unremarkable.  Musculoskeletal: Full ROM all peripheral extremities, joint stability, 5/5 strength and normal gait.  Skin: Warm, dry without exposed rashes, lesions or ecchymosis apparent.  Neuro: Cranial nerves intact, reflexes equal bilaterally. Sensory-motor testing grossly intact. Tendon reflexes grossly intact.  Pysch: Alert & oriented x 3.  Insight and judgement nl & appropriate. No ideations.  Assessment and Plan:  1. Essential hypertension  - Continue medication, monitor blood pressure at home.  - Continue DASH diet.  Reminder to go to the ER if any CP,  SOB, nausea,  dizziness, severe HA, changes vision/speech.  - CBC with Differential/Platelet - COMPLETE METABOLIC PANEL WITH GFR - Magnesium - TSH  2. Hyperlipidemia, mixed  - Continue diet/meds, exercise,& lifestyle modifications.  - Continue monitor periodic cholesterol/liver & renal functions   - Lipid panel - TSH  3. Abnormal glucose  - Continue diet, exercise  - Lifestyle modifications.  - Monitor appropriate labs.  - Hemoglobin A1c  4. Vitamin D deficiency  - Continue supplementation.  - VITAMIN D 25 Hydroxyl  5. Prediabetes  - Hemoglobin A1c - Insulin, random  6. Hypothyroidism  - TSH  7. Gastroesophageal reflux disease  - CBC with Differential/Platelet  8. Medication management  - CBC with Differential/Platelet - COMPLETE METABOLIC PANEL WITH GFR - Magnesium - Lipid panel - TSH - Hemoglobin A1c - Insulin, random - VITAMIN D 25 Hydroxyl        Discussed  regular exercise, BP monitoring, weight control to achieve/maintain BMI less than 25 and discussed med and SE's. Recommended labs to assess and monitor clinical status with further disposition pending results of labs.  I discussed the assessment and treatment plan with the patient. The patient was provided an opportunity to ask questions and all were answered. The patient agreed with the plan and demonstrated an understanding of the instructions.  I provided over 30 minutes of exam, counseling, chart review and  complex critical decision making.  Kirtland Bouchard, MD

## 2019-08-02 NOTE — Patient Instructions (Signed)

## 2019-08-03 ENCOUNTER — Ambulatory Visit (INDEPENDENT_AMBULATORY_CARE_PROVIDER_SITE_OTHER): Payer: Medicare Other | Admitting: Internal Medicine

## 2019-08-03 ENCOUNTER — Other Ambulatory Visit: Payer: Self-pay

## 2019-08-03 VITALS — BP 104/62 | HR 76 | Temp 97.4°F | Resp 16 | Ht 64.5 in | Wt 112.8 lb

## 2019-08-03 DIAGNOSIS — E559 Vitamin D deficiency, unspecified: Secondary | ICD-10-CM

## 2019-08-03 DIAGNOSIS — E039 Hypothyroidism, unspecified: Secondary | ICD-10-CM

## 2019-08-03 DIAGNOSIS — I10 Essential (primary) hypertension: Secondary | ICD-10-CM

## 2019-08-03 DIAGNOSIS — R7303 Prediabetes: Secondary | ICD-10-CM

## 2019-08-03 DIAGNOSIS — E782 Mixed hyperlipidemia: Secondary | ICD-10-CM

## 2019-08-03 DIAGNOSIS — Z79899 Other long term (current) drug therapy: Secondary | ICD-10-CM | POA: Diagnosis not present

## 2019-08-03 DIAGNOSIS — K219 Gastro-esophageal reflux disease without esophagitis: Secondary | ICD-10-CM

## 2019-08-03 DIAGNOSIS — R7309 Other abnormal glucose: Secondary | ICD-10-CM

## 2019-08-04 ENCOUNTER — Other Ambulatory Visit: Payer: Self-pay | Admitting: Internal Medicine

## 2019-08-04 DIAGNOSIS — E039 Hypothyroidism, unspecified: Secondary | ICD-10-CM

## 2019-08-04 LAB — COMPLETE METABOLIC PANEL WITH GFR
AG Ratio: 2.3 (calc) (ref 1.0–2.5)
ALT: 16 U/L (ref 6–29)
AST: 21 U/L (ref 10–35)
Albumin: 4.1 g/dL (ref 3.6–5.1)
Alkaline phosphatase (APISO): 38 U/L (ref 37–153)
BUN: 11 mg/dL (ref 7–25)
CO2: 26 mmol/L (ref 20–32)
Calcium: 9.1 mg/dL (ref 8.6–10.4)
Chloride: 99 mmol/L (ref 98–110)
Creat: 0.87 mg/dL (ref 0.60–0.93)
GFR, Est African American: 74 mL/min/{1.73_m2} (ref >=60)
GFR, Est Non African American: 64 mL/min/{1.73_m2} (ref >=60)
Globulin: 1.8 g/dL (calc) — ABNORMAL LOW (ref 1.9–3.7)
Glucose, Bld: 123 mg/dL — ABNORMAL HIGH (ref 65–99)
Potassium: 3.9 mmol/L (ref 3.5–5.3)
Sodium: 132 mmol/L — ABNORMAL LOW (ref 135–146)
Total Bilirubin: 0.5 mg/dL (ref 0.2–1.2)
Total Protein: 5.9 g/dL — ABNORMAL LOW (ref 6.1–8.1)

## 2019-08-04 LAB — CBC WITH DIFFERENTIAL/PLATELET
Absolute Monocytes: 489 cells/uL (ref 200–950)
Basophils Absolute: 29 cells/uL (ref 0–200)
Basophils Relative: 0.4 %
Eosinophils Absolute: 22 cells/uL (ref 15–500)
Eosinophils Relative: 0.3 %
HCT: 39.9 % (ref 35.0–45.0)
Hemoglobin: 13.6 g/dL (ref 11.7–15.5)
Lymphs Abs: 1314 cells/uL (ref 850–3900)
MCH: 32.2 pg (ref 27.0–33.0)
MCHC: 34.1 g/dL (ref 32.0–36.0)
MCV: 94.5 fL (ref 80.0–100.0)
MPV: 10.4 fL (ref 7.5–12.5)
Monocytes Relative: 6.7 %
Neutro Abs: 5446 cells/uL (ref 1500–7800)
Neutrophils Relative %: 74.6 %
Platelets: 230 10*3/uL (ref 140–400)
RBC: 4.22 10*6/uL (ref 3.80–5.10)
RDW: 13.6 % (ref 11.0–15.0)
Total Lymphocyte: 18 %
WBC: 7.3 10*3/uL (ref 3.8–10.8)

## 2019-08-04 LAB — LIPID PANEL
Cholesterol: 179 mg/dL (ref <200)
HDL: 58 mg/dL (ref >=50)
LDL Cholesterol (Calc): 104 mg/dL (calc) — ABNORMAL HIGH
Non-HDL Cholesterol (Calc): 121 mg/dL (calc) (ref <130)
Total CHOL/HDL Ratio: 3.1 (calc) (ref <5.0)
Triglycerides: 84 mg/dL (ref <150)

## 2019-08-04 LAB — HEMOGLOBIN A1C
Hgb A1c MFr Bld: 5.5 % of total Hgb (ref <5.7)
Mean Plasma Glucose: 111 (calc)
eAG (mmol/L): 6.2 (calc)

## 2019-08-04 LAB — TSH: TSH: 0.05 mIU/L — ABNORMAL LOW (ref 0.40–4.50)

## 2019-08-04 LAB — INSULIN, RANDOM: Insulin: 48 u[IU]/mL — ABNORMAL HIGH

## 2019-08-04 LAB — MAGNESIUM: Magnesium: 1.8 mg/dL (ref 1.5–2.5)

## 2019-08-04 LAB — VITAMIN D 25 HYDROXY (VIT D DEFICIENCY, FRACTURES): Vit D, 25-Hydroxy: 65 ng/mL (ref 30–100)

## 2019-08-11 DIAGNOSIS — H938X3 Other specified disorders of ear, bilateral: Secondary | ICD-10-CM | POA: Diagnosis not present

## 2019-08-11 DIAGNOSIS — H918X2 Other specified hearing loss, left ear: Secondary | ICD-10-CM | POA: Diagnosis not present

## 2019-08-11 DIAGNOSIS — H6983 Other specified disorders of Eustachian tube, bilateral: Secondary | ICD-10-CM | POA: Diagnosis not present

## 2019-08-11 DIAGNOSIS — H65116 Acute and subacute allergic otitis media (mucoid) (sanguinous) (serous), recurrent, bilateral: Secondary | ICD-10-CM | POA: Diagnosis not present

## 2019-08-12 DIAGNOSIS — I8312 Varicose veins of left lower extremity with inflammation: Secondary | ICD-10-CM | POA: Diagnosis not present

## 2019-08-12 DIAGNOSIS — I8311 Varicose veins of right lower extremity with inflammation: Secondary | ICD-10-CM | POA: Diagnosis not present

## 2019-08-20 DIAGNOSIS — I8312 Varicose veins of left lower extremity with inflammation: Secondary | ICD-10-CM | POA: Diagnosis not present

## 2019-08-20 DIAGNOSIS — I8311 Varicose veins of right lower extremity with inflammation: Secondary | ICD-10-CM | POA: Diagnosis not present

## 2019-08-25 ENCOUNTER — Other Ambulatory Visit: Payer: Self-pay

## 2019-08-25 ENCOUNTER — Encounter: Payer: Self-pay | Admitting: Adult Health Nurse Practitioner

## 2019-08-25 ENCOUNTER — Ambulatory Visit (INDEPENDENT_AMBULATORY_CARE_PROVIDER_SITE_OTHER): Payer: Medicare Other | Admitting: Adult Health Nurse Practitioner

## 2019-08-25 VITALS — BP 128/86 | HR 68 | Temp 98.1°F | Wt 111.6 lb

## 2019-08-25 DIAGNOSIS — Z23 Encounter for immunization: Secondary | ICD-10-CM

## 2019-08-25 DIAGNOSIS — IMO0001 Reserved for inherently not codable concepts without codable children: Secondary | ICD-10-CM

## 2019-08-25 DIAGNOSIS — H918X2 Other specified hearing loss, left ear: Secondary | ICD-10-CM

## 2019-08-25 DIAGNOSIS — H9202 Otalgia, left ear: Secondary | ICD-10-CM | POA: Diagnosis not present

## 2019-08-25 DIAGNOSIS — H938X3 Other specified disorders of ear, bilateral: Secondary | ICD-10-CM | POA: Diagnosis not present

## 2019-08-25 NOTE — Patient Instructions (Addendum)
   Please take these medications:     Allergy Symptoms / Runny Nose:  Claritin  Take one tablet once a day   Zyrtec / Cetirizine Take 72m by mouth     May cause drowsiness, take nightly Be sure to drink plenty of water  Xyzal / Levocetirazine  Take 580mby mouth May cause drowsiness, take nightly Be sure to drink plenty of water   If you take the 12hour tablet you can rotate between Claritin and Zyrtec    You may pick an antihistamine from above and take allegra in the same day, they work differently.  Another type is:  Allegra / fexofenadine Take 18017my mouth daily   Continue to use the heating pad for relive as well as massage to the area to help increase drainage.   Look up Eustachian Tube Drainage, this will help to keep your ears draining.   You can try some ibuprofen 200m67m see if this will help with inflammation.  If this bothers your stomach do not take.

## 2019-08-25 NOTE — Progress Notes (Signed)
Assessment and Plan:  Debbie Houston was seen today for acute visit.  Diagnoses and all orders for this visit:  Asymmetrical hearing loss of left ear Follow up with Audiology, scheduled  Sensation of fullness in both ears Discussed antihistamines Zyrtec daily may need to add allegra during the day. -Continue Flonase daily and saline Monitor symptoms  Otalgia, left ear May continue heating pad 15-20 min at a time Take OTC Ibuprofen 264m Q8 PRN take with food, monitor stomach, hx diverticulosis.  May help with inflammation Discussed Tylenol 1,0075mQ6 PRN for pain Discussed Eustachian tube drainage message to facilitate drainage. Increase water intake  Needs flu shot -     Flu vaccine HIGH DOSE PF Received today   Call or return with new or worsening symptoms    Discussed med's effects and SE's.   Over 30 minutes of interview, exam, counseling, chart review, and critical decision making was performed.   Future Appointments  Date Time Provider [place]12/17/2020 10:45 AM CoVicie MuttersPA-C [facility] None  02/03/2020  2:30 PM McUnk PintoMD [facility] None  05/11/2020  2:00 PM CoVicie MuttersPA-C [facility] None    ------------------------------------------------------------------------------------------------------------------   HPI [age].o.female presents for evaluation of left ear pain.  She had an ENT visit on 08/11/19 with Dr ShWilburn Cornelia Since then she has been doing well.  He recommended she start on flonase daily as well as a saline solution.  Today she reports the left is hurting, two days ago was the worst it improved this morning prior to the appointment.  She has used a heating pad to her left ear and reports this has provided some relief.    She is taking clairitin during the day and sometimes alternating with zyrtec.  She is not sure if this is helping.  She endorses tenderness, bilateral, below her ears.  She denies any fevers, headaches,vertigo,  rhinitis, sore throat or cough.   She has a follow up on 10/05/19 to have her hearing tested.   Past Medical History:  Diagnosis Date  . Arthritis   . Benign labile hypertension   [place]  . Colitis   . DJD (degenerative joint disease)   . Endometrial polyp   . GERD (gastroesophageal reflux disease)   . History of basal cell carcinoma excision   . History of esophagitis   . HOH (hard of hearing)    right ear better than left  -- wears no aides  . Hyperlipidemia   . Hypothyroidism   . Osteopenia   . Prediabetes   . Vitamin D deficiency   . Wears glasses      Allergies  Allergen Reactions  . Fosamax [Alendronate Sodium] Other (See Comments)    Reaction:  GI upset   . Singulair [Montelukast Sodium] Other (See Comments)    Reaction:  Makes pt jittery  . Wellbutrin [Bupropion] Hives  . Sulfa Antibiotics Rash    Current Outpatient Medications on File Prior to Visit  Medication Sig  . acetaminophen (TYLENOL) 500 MG tablet Take 500 mg by mouth every 6 (six) hours as needed.  . Ascorbic Acid (VITAMIN C) 1000 MG tablet Take 1,000 mg by mouth daily.   . Marland Kitchenspirin EC 81 MG tablet Take 81 mg by mouth daily.   . bisoprolol-hydrochlorothiazide (ZIAC) 2.5-6.25 MG tablet Take 1 tablet daily for BP (Patient taking differently: 1 tablet daily. )  . budesonide (ENTOCORT EC) 3 MG 24 hr capsule Take 3  mg by mouth daily.  . Calcium Carbonate-Vitamin D (CALCIUM 600+D HIGH POTENCY) 600-400 MG-UNIT tablet Take 1 tablet by mouth daily.  . Cinnamon 500 MG TABS Take 1,000 mg by mouth daily.   . Coenzyme Q10 (COQ10) 200 MG CAPS Take 200 mg by mouth daily.  Marland Kitchen levothyroxine (SYNTHROID) 125 MCG tablet Take one tablet on Sun, Mon, Wed & Fri and 1/2 tablet all other days  . Multiple Vitamins-Minerals (MULTIVITAMIN WITH MINERALS) tablet Take 1 tablet by mouth daily.  Marland Kitchen OVER THE COUNTER MEDICATION Vein Health 1 tablet daily.  . pantoprazole (PROTONIX) 40 MG tablet Take 1  tablet daily for Acid Indigestion & Reflux  . Polyethyl Glycol-Propyl Glycol (SYSTANE) 0.4-0.3 % SOLN Place 1 drop into both eyes 2 (two) times daily as needed (for dry eyes).  . Probiotic Product (PROBIOTIC PO) Take 1 capsule by mouth daily.  . simvastatin (ZOCOR) 20 MG tablet TAKE 1 TABLET BY MOUTH DAILY   No current facility-administered medications on file prior to visit.     ROS: all negative except above.   Physical Exam:  BP 128/86   Pulse 68   Temp 98.1 F (36.7 C)   Wt 111 lb 9.6 oz (50.6 kg)   SpO2 96%   BMI 18.86 kg/m   General Appearance: Well nourished, in no apparent distress. Eyes: PERRLA, EOMs, conjunctiva no swelling or erythema Sinuses: No Frontal/maxillary tenderness ENT/Mouth: Ext aud canals clear, TMs without erythema, bulging. No erythema, swelling, or exudate on post pharynx.  Tonsils not swollen or erythematous. Hearing normal.  Neck: Supple, thyroid normal.  Respiratory: Respiratory effort normal, BS equal bilaterally without rales, rhonchi, wheezing or stridor.  Cardio: RRR with no MRGs. Brisk peripheral pulses without edema.  Abdomen: Soft, + BS.  Non tender, no guarding, rebound, hernias, masses. Lymphatics: Non tender without lymphadenopathy.  Musculoskeletal: Full ROM, 5/5 strength, normal gait.  Skin: Warm, dry without rashes, lesions, ecchymosis.  Neuro: Cranial nerves intact. Normal muscle tone, no cerebellar symptoms. Sensation intact.  Psych: Awake and oriented X 3, normal affect, Insight and Judgment appropriate.     Garnet Sierras, NP 10:46 AM [facility]

## 2019-09-07 DIAGNOSIS — H26491 Other secondary cataract, right eye: Secondary | ICD-10-CM | POA: Diagnosis not present

## 2019-09-07 DIAGNOSIS — H52223 Regular astigmatism, bilateral: Secondary | ICD-10-CM | POA: Diagnosis not present

## 2019-09-07 DIAGNOSIS — H5202 Hypermetropia, left eye: Secondary | ICD-10-CM | POA: Diagnosis not present

## 2019-09-07 DIAGNOSIS — H524 Presbyopia: Secondary | ICD-10-CM | POA: Diagnosis not present

## 2019-09-10 ENCOUNTER — Other Ambulatory Visit: Payer: Self-pay

## 2019-09-10 ENCOUNTER — Ambulatory Visit (INDEPENDENT_AMBULATORY_CARE_PROVIDER_SITE_OTHER): Payer: Medicare Other

## 2019-09-10 VITALS — BP 120/74 | HR 65 | Temp 97.5°F | Ht 64.5 in | Wt 114.0 lb

## 2019-09-10 DIAGNOSIS — E039 Hypothyroidism, unspecified: Secondary | ICD-10-CM | POA: Diagnosis not present

## 2019-09-10 LAB — TSH: TSH: 1.08 mIU/L (ref 0.40–4.50)

## 2019-09-10 NOTE — Progress Notes (Signed)
Patient reports for recheck TSH.  Vitals entered into CHART.    Patient reports she takes 1/2 tablet every day-------LEVOTHYROXINE 125 MCG

## 2019-09-16 DIAGNOSIS — M542 Cervicalgia: Secondary | ICD-10-CM | POA: Diagnosis not present

## 2019-09-16 DIAGNOSIS — M47812 Spondylosis without myelopathy or radiculopathy, cervical region: Secondary | ICD-10-CM | POA: Diagnosis not present

## 2019-09-16 DIAGNOSIS — M5416 Radiculopathy, lumbar region: Secondary | ICD-10-CM | POA: Diagnosis not present

## 2019-09-16 DIAGNOSIS — M25552 Pain in left hip: Secondary | ICD-10-CM | POA: Diagnosis not present

## 2019-09-28 DIAGNOSIS — M5116 Intervertebral disc disorders with radiculopathy, lumbar region: Secondary | ICD-10-CM | POA: Diagnosis not present

## 2019-09-28 DIAGNOSIS — M5416 Radiculopathy, lumbar region: Secondary | ICD-10-CM | POA: Diagnosis not present

## 2019-10-05 DIAGNOSIS — H903 Sensorineural hearing loss, bilateral: Secondary | ICD-10-CM | POA: Diagnosis not present

## 2019-10-05 DIAGNOSIS — K219 Gastro-esophageal reflux disease without esophagitis: Secondary | ICD-10-CM | POA: Diagnosis not present

## 2019-10-05 DIAGNOSIS — H6983 Other specified disorders of Eustachian tube, bilateral: Secondary | ICD-10-CM | POA: Diagnosis not present

## 2019-10-05 DIAGNOSIS — J310 Chronic rhinitis: Secondary | ICD-10-CM | POA: Diagnosis not present

## 2019-10-05 DIAGNOSIS — H918X2 Other specified hearing loss, left ear: Secondary | ICD-10-CM | POA: Diagnosis not present

## 2019-10-21 NOTE — Progress Notes (Signed)
Assessment and Plan:  Debbie Houston was seen today for acute visit, dizziness and fatigue.  Diagnoses and all orders for this visit:  Vertigo Has seen ENT for this Remains intermittent Using antihistamines & flonase for allergies Discussed Meclizine?  Osteoarthritis, unspecified osteoarthritis type, unspecified site Using heat, topical preparations  Hypothyroidism, unspecified type Taking levothyroxine 125 mcg, half tablets daily Reminder to take on an empty stomach 30-104mns before first meal of the day. No antacid medications for 4 hours. -     TSH -     T3 -     T4, Free  Malaise and fatigue -     Epstein-Barr virus VCA antibody panel- MONO  Vitamin D deficiency Continue supplementation Taking Vitamin D 400 IU daily in MultiVit -     Vitamin D (25 hydroxy)  Pain in both hands Joint pains, increasing fatigue -     Rheumatoid factor -     ANA - Anti-nuclear ab-titer (ANA titer) Scleroderma/Sjogren/SLE/RA/Polymyositis/Dermatomyositis/JRA/Graves/Connective Tissue/Autoimmune Hepatitis -     Cyclic citrul peptide antibody, IgG Consider Rheumatology referral  B12 deficiency -     Vitamin B12  Gastroesophageal reflux disease with esophagitis, unspecified whether hemorrhage Increasing symptoms, change Protonix for Omeprazole 251mBID for 8 weeks. Diet discussed Monitor for triggers Avoid food with high acid content Avoid excessive cafeine Increase water intake -     omeprazole (PRILOSEC) 20 MG capsule; Take 1 capsule (20 mg total) by mouth 2 (two) times daily.   Asymmetrical hearing loss of left ear Follow up with Audiology, scheduled  Call or return with new or worsening symptoms   Discussed med's effects and SE's.   Over 30 minutes of interview, exam, counseling, chart review, and critical decision making was performed.   Future Appointments  Date Time Provider [place]12/17/2020 10:45 AM CoVicie MuttersPA-C [facility] None  02/03/2020  2:30 PM  McUnk PintoMD [facility] None  05/11/2020  2:00 PM CoVicie MuttersPA-C [facility] None    ------------------------------------------------------------------------------------------------------------------   HPI [id number].o.female presents for evaluation of left ear pain.  She had an ENT visit on 08/11/19 with Dr ShWilburn Cornelia Since then she has been doing well.  He recommended she start on flonase daily as well as a saline solution. She chronically has fullness in her left ear.  She has been taking OTC antihistamines with some help.  She reports dizziness continues , it remaines intermittent and short in duration.  She has been feeling more fatigued that has also been increasing. She has osteoarthritis in bilateral hands.  She reports this has been increasing with stiffness in the morning.  She has enlargement of PIP joints.  Beginnings of deformity to proximal phalanges.  She has progressive hearing loss on left side, a follow up on 10/05/19 and had hearing testing.  She reports that over the past couple months she has had an increase in reflux that has been gradual over the past three months.  She is having an increase in burping and burning, she points to her sternum area. She has had an increase in reflux especially at night when anying down.  She monitors her diet closely and unable to pinpoint any triggers.    Past Medical History:  Diagnosis Date  . Arthritis   . Benign labile hypertension   . Cancer ([place]ley   skin cancer on face  . Colitis   . DJD (degenerative joint disease)   . Endometrial polyp   . GERD (gastroesophageal reflux disease)   .  History of basal cell carcinoma excision   . History of esophagitis   . HOH (hard of hearing)    right ear better than left  -- wears no aides  . Hyperlipidemia   . Hypothyroidism   . Osteopenia   . Prediabetes   . Vitamin D deficiency   . Wears glasses      Allergies  Allergen Reactions  . Fosamax [Alendronate Sodium] Other (See  Comments)    Reaction:  GI upset   . Singulair [Montelukast Sodium] Other (See Comments)    Reaction:  Makes pt jittery  . Wellbutrin [Bupropion] Hives  . Sulfa Antibiotics Rash    Current Outpatient Medications on File Prior to Visit  Medication Sig  . acetaminophen (TYLENOL) 500 MG tablet Take 500 mg by mouth every 6 (six) hours as needed.  . Ascorbic Acid (VITAMIN C) 1000 MG tablet Take 1,000 mg by mouth daily.   Marland Kitchen aspirin EC 81 MG tablet Take 81 mg by mouth daily.   . bisoprolol-hydrochlorothiazide (ZIAC) 2.5-6.25 MG tablet Take 1 tablet daily for BP (Patient taking differently: 1 tablet daily. )  . budesonide (ENTOCORT EC) 3 MG 24 hr capsule Take 3 mg by mouth daily.  . Calcium Carbonate-Vitamin D (CALCIUM 600+D HIGH POTENCY) 600-400 MG-UNIT tablet Take 1 tablet by mouth daily.  . Cinnamon 500 MG TABS Take 1,000 mg by mouth daily.   . Coenzyme Q10 (COQ10) 200 MG CAPS Take 200 mg by mouth daily.  Marland Kitchen levothyroxine (SYNTHROID) 125 MCG tablet Take one tablet on Sun, Mon, Wed & Fri and 1/2 tablet all other days  . Multiple Vitamins-Minerals (MULTIVITAMIN WITH MINERALS) tablet Take 1 tablet by mouth daily.  Marland Kitchen OVER THE COUNTER MEDICATION Vein Health 1 tablet daily.  . pantoprazole (PROTONIX) 40 MG tablet Take 1 tablet daily for Acid Indigestion & Reflux  . Polyethyl Glycol-Propyl Glycol (SYSTANE) 0.4-0.3 % SOLN Place 1 drop into both eyes 2 (two) times daily as needed (for dry eyes).  . Probiotic Product (PROBIOTIC PO) Take 1 capsule by mouth daily.  . simvastatin (ZOCOR) 20 MG tablet TAKE 1 TABLET BY MOUTH DAILY   No current facility-administered medications on file prior to visit.     ROS: all negative except above.   Physical Exam:  BP 120/80   Pulse 71   Temp (!) 97.5 F (36.4 C)   Wt 115 lb 3.2 oz (52.3 kg)   SpO2 97%   BMI 19.47 kg/m   General Appearance: Well nourished, in no apparent distress. Eyes: PERRLA, EOMs, conjunctiva no swelling or erythema Sinuses: No  Frontal/maxillary tenderness ENT/Mouth: Ext aud canals clear, TMs without erythema, bulging. No erythema, swelling, or exudate on post pharynx.  Tonsils not swollen or erythematous. Hearing normal.  Neck: Supple, thyroid normal.  Respiratory: Respiratory effort normal, BS equal bilaterally without rales, rhonchi, wheezing or stridor.  Cardio: RRR with no MRGs. Brisk peripheral pulses without edema.  Abdomen: Soft, + BS.  Non tender, no guarding, rebound, hernias, masses. Lymphatics: Non tender without lymphadenopathy.  Musculoskeletal: Full ROM, 5/5 strength, normal gait. PIP joint enlargement bilateral hands.  Skin: Warm, dry without rashes, lesions, ecchymosis.  Neuro: Cranial nerves intact. Normal muscle tone, no cerebellar symptoms. Sensation intact.  Psych: Awake and oriented X 3, normal affect, Insight and Judgment appropriate.     Garnet Sierras, NP 1:00 PM [facility]

## 2019-10-22 ENCOUNTER — Encounter: Payer: Self-pay | Admitting: Adult Health Nurse Practitioner

## 2019-10-22 ENCOUNTER — Ambulatory Visit (INDEPENDENT_AMBULATORY_CARE_PROVIDER_SITE_OTHER): Payer: Medicare Other | Admitting: Adult Health Nurse Practitioner

## 2019-10-22 ENCOUNTER — Other Ambulatory Visit: Payer: Self-pay

## 2019-10-22 VITALS — BP 120/80 | HR 71 | Temp 97.5°F | Wt 115.2 lb

## 2019-10-22 DIAGNOSIS — R5381 Other malaise: Secondary | ICD-10-CM | POA: Diagnosis not present

## 2019-10-22 DIAGNOSIS — IMO0001 Reserved for inherently not codable concepts without codable children: Secondary | ICD-10-CM

## 2019-10-22 DIAGNOSIS — K2100 Gastro-esophageal reflux disease with esophagitis, without bleeding: Secondary | ICD-10-CM

## 2019-10-22 DIAGNOSIS — E559 Vitamin D deficiency, unspecified: Secondary | ICD-10-CM

## 2019-10-22 DIAGNOSIS — E039 Hypothyroidism, unspecified: Secondary | ICD-10-CM | POA: Diagnosis not present

## 2019-10-22 DIAGNOSIS — M1990 Unspecified osteoarthritis, unspecified site: Secondary | ICD-10-CM | POA: Diagnosis not present

## 2019-10-22 DIAGNOSIS — M79642 Pain in left hand: Secondary | ICD-10-CM | POA: Diagnosis not present

## 2019-10-22 DIAGNOSIS — R42 Dizziness and giddiness: Secondary | ICD-10-CM

## 2019-10-22 DIAGNOSIS — M79641 Pain in right hand: Secondary | ICD-10-CM

## 2019-10-22 DIAGNOSIS — R768 Other specified abnormal immunological findings in serum: Secondary | ICD-10-CM

## 2019-10-22 DIAGNOSIS — E538 Deficiency of other specified B group vitamins: Secondary | ICD-10-CM

## 2019-10-22 DIAGNOSIS — R5383 Other fatigue: Secondary | ICD-10-CM

## 2019-10-22 DIAGNOSIS — H918X2 Other specified hearing loss, left ear: Secondary | ICD-10-CM

## 2019-10-22 MED ORDER — OMEPRAZOLE 20 MG PO CPDR: 20.0000 mg | DELAYED_RELEASE_CAPSULE | Freq: Two times a day (BID) | ORAL | 1 refills | Status: DC

## 2019-10-22 NOTE — Patient Instructions (Addendum)
  We are changing your protonix to Omperazole 98m.  Take one tablet twice a day for 8 weeks.  You have an upcoming appointment with AEstill Bambergfor follow up.  If your symptoms are worse please let uKoreaknow before this time.   I will contact you with lab results in 1-2 days.  Couple labs check today will take longer to get back.

## 2019-10-25 LAB — CYCLIC CITRUL PEPTIDE ANTIBODY, IGG: Cyclic Citrullin Peptide Ab: <16 UNITS

## 2019-10-25 LAB — VITAMIN B12: Vitamin B-12: 612 pg/mL (ref 200–1100)

## 2019-10-25 LAB — EPSTEIN-BARR VIRUS VCA ANTIBODY PANEL
EBV NA IgG: <18 U/mL
EBV VCA IgG: 188 U/mL — ABNORMAL HIGH
EBV VCA IgM: <36 U/mL

## 2019-10-25 LAB — TSH: TSH: 4.42 mIU/L (ref 0.40–4.50)

## 2019-10-25 LAB — T4, FREE: Free T4: 1.4 ng/dL (ref 0.8–1.8)

## 2019-10-25 LAB — RHEUMATOID FACTOR: Rheumatoid fact SerPl-aCnc: <14 IU/mL (ref <14)

## 2019-10-25 LAB — VITAMIN D 25 HYDROXY (VIT D DEFICIENCY, FRACTURES): Vit D, 25-Hydroxy: 51 ng/mL (ref 30–100)

## 2019-10-25 LAB — ANTI-NUCLEAR AB-TITER (ANA TITER): ANA Titer 1: 1:80 {titer} — ABNORMAL HIGH

## 2019-10-25 LAB — ANA: Anti Nuclear Antibody (ANA): POSITIVE — AB

## 2019-10-25 LAB — T3: T3, Total: 62 ng/dL — ABNORMAL LOW (ref 76–181)

## 2019-10-28 ENCOUNTER — Encounter: Payer: Self-pay | Admitting: Adult Health Nurse Practitioner

## 2019-10-30 ENCOUNTER — Other Ambulatory Visit: Payer: Self-pay | Admitting: Adult Health Nurse Practitioner

## 2019-10-30 DIAGNOSIS — M19042 Primary osteoarthritis, left hand: Secondary | ICD-10-CM

## 2019-10-30 DIAGNOSIS — M19041 Primary osteoarthritis, right hand: Secondary | ICD-10-CM

## 2019-10-30 MED ORDER — PREDNISONE 10 MG (21) PO TBPK: ORAL_TABLET | Freq: Every day | ORAL | 0 refills | Status: DC

## 2019-11-02 NOTE — Progress Notes (Signed)
FOLLOW UP  Assessment:   Essential hypertension -     CBC with Diff -     COMPLETE METABOLIC PANEL WITH GFR -     TSH - continue medications, DASH diet, exercise and monitor at home. Call if greater than 130/80.   Mixed hyperlipidemia -     Lipid Profile check lipids decrease fatty foods increase activity.   Hypothyroidism, unspecified type -     TSH Hypothyroidism-check TSH level, continue medications the same, reminded to take on an empty stomach 30-29mns before food.  Goal between 1-2  Medication management -     Magnesium  Vitamin D deficiency -     Vitamin D (25 hydroxy)  Vertigo ? Vertigo versus orthostatic hypotension, worse with standing Normal neuro, MRI 2016, has seen ENT Check labs, follow up GI She was informed to call 911 if she develop any new symptoms such as worsening headaches, episodes of blurred vision, double vision or complete loss of vision or speech difficulties or motor weakness.  Iron deficiency -     Iron,Total/Total Iron Binding Cap -     Ferritin  Senile purpura ([place] Discussed process, protect skin, sunscreen  Malaise and fatigue Check iron, follow back up with GI, follow up closely   Over 30 minutes of exam, counseling, chart review, and critical decision making was performed  Future Appointments Future Appointments  Date Time Provider [place] 02/03/2020  2:30 PM MUnk Pinto MD [facility] None  05/11/2020  2:00 PM CVicie Mutters PA-C [facility] None      Subjective:   Debbie HELLINGis a 78y.o. female who presents for 3 month follow up on hypertension, prediabetes, hyperlipidemia, vitamin D def.   Has seen ENT for hearing loss/dizziness. She continue to have dizziness, worse with standing. She also states she is constantly tired and more fatigued than usual, has been having bilateral hand pain, positive ANA, negative CCP and RF, discussed with patient that ANA is not a specific test however with autoimmune  history with colitis/TSH and bilateral hand pain, she is going to see rheumotology.  Did trial of prednisone and states she did not see a difference.  Normal MRI 2016 brain Never MRI of her neck She had normal B12 She has epigastric pain, GERD, last EGD 2016, will set up with GI in the new year.   Her blood pressure has been controlled at home, she is on BP every other day, today their BP is BP: 136/70  She does workout 3 x a week. She denies chest pain, shortness of breath, dizziness.  She is on cholesterol medication and denies myalgias. Her cholesterol is not at goal. The cholesterol last visit was:   Lab Results  Component Value Date   CHOL 179 08/03/2019   HDL 58 08/03/2019   LDLCALC 104 (H) 08/03/2019   TRIG 84 08/03/2019   CHOLHDL 3.1 08/03/2019    She has been working on diet and exercise for prediabetes, and denies paresthesia of the feet, polydipsia, polyuria and visual disturbances. Last A1C in the office was:  Lab Results  Component Value Date   HGBA1C 5.5 08/03/2019   Patient is on Vitamin D supplement.   Lab Results  Component Value Date   VD25OH 51 10/22/2019     She is on thyroid medication. Her medication was changed last visit, increased. She is on 1 tablet Tues and Thursday and 1/2 the rest of the time.  Lab Results  Component Value Date   TSH  4.42 10/22/2019   BMI is Body mass index is 19.27 kg/m., she is working on diet and exercise. Wt Readings from Last 3 Encounters:  11/05/19 114 lb (51.7 kg)  10/22/19 115 lb 3.2 oz (52.3 kg)  09/10/19 114 lb (51.7 kg)    Medication Review  Current Outpatient Medications (Endocrine & Metabolic):  .  budesonide (ENTOCORT EC) 3 MG 24 hr capsule, Take 3 mg by mouth daily. Marland Kitchen  levothyroxine (SYNTHROID) 125 MCG tablet, Take one tablet on Sun, Mon, Wed & Fri and 1/2 tablet all other days  Current Outpatient Medications (Cardiovascular):  .  bisoprolol-hydrochlorothiazide (ZIAC) 2.5-6.25 MG tablet, Take 1 tablet daily  for BP (Patient taking differently: 1 tablet daily. ) .  simvastatin (ZOCOR) 20 MG tablet, TAKE 1 TABLET BY MOUTH DAILY   Current Outpatient Medications (Analgesics):  .  acetaminophen (TYLENOL) 500 MG tablet, Take 500 mg by mouth every 6 (six) hours as needed. Marland Kitchen  aspirin EC 81 MG tablet, Take 81 mg by mouth daily.    Current Outpatient Medications (Other):  Marland Kitchen  Ascorbic Acid (VITAMIN C) 1000 MG tablet, Take 1,000 mg by mouth daily.  .  Calcium Carbonate-Vitamin D (CALCIUM 600+D HIGH POTENCY) 600-400 MG-UNIT tablet, Take 1 tablet by mouth daily. .  Cinnamon 500 MG TABS, Take 1,000 mg by mouth daily.  .  Coenzyme Q10 (COQ10) 200 MG CAPS, Take 200 mg by mouth daily. .  Multiple Vitamins-Minerals (MULTIVITAMIN WITH MINERALS) tablet, Take 1 tablet by mouth daily. Marland Kitchen  omeprazole (PRILOSEC) 20 MG capsule, Take 1 capsule (20 mg total) by mouth 2 (two) times daily. Marland Kitchen  OVER THE COUNTER MEDICATION, Vein Health 1 tablet daily. .  pantoprazole (PROTONIX) 40 MG tablet, Take 1 tablet daily for Acid Indigestion & Reflux .  Polyethyl Glycol-Propyl Glycol (SYSTANE) 0.4-0.3 % SOLN, Place 1 drop into both eyes 2 (two) times daily as needed (for dry eyes). .  Probiotic Product (PROBIOTIC PO), Take 1 capsule by mouth daily.   Current Problems (verified) has Essential hypertension; Hyperlipidemia; Hypothyroid; GERD; Asthma; DJD (degenerative joint disease); Vitamin D deficiency; Osteopenia; Allergy; Anemia; Medication management; Colitis; Encounter for Medicare annual wellness exam; Spondylolisthesis at L4-L5 level; Otalgia of both ears; and Vertigo on their problem list.   Allergies Allergies  Allergen Reactions  . Fosamax [Alendronate Sodium] Other (See Comments)    Reaction:  GI upset   . Singulair [Montelukast Sodium] Other (See Comments)    Reaction:  Makes pt jittery  . Wellbutrin [Bupropion] Hives  . Sulfa Antibiotics Rash   Surgical History: reviewed and unchanged Family History: reviewed and  unchanged Social History: reviewed and unchanged    Objective:   Today's Vitals   11/05/19 1039  BP: 136/70  Pulse: 76  Temp: 97.6 F (36.4 C)  SpO2: 96%  Weight: 114 lb (51.7 kg)    General appearance: alert, no distress, WD/WN,  female HEENT: normocephalic, sclerae anicteric, TMs pearly with effusion bilateral ears, nares patent, no discharge or erythema, pharynx normal Oral cavity: MMM, no lesions Neck: supple, no lymphadenopathy, no thyromegaly, no masses Heart: RRR, normal S1, S2, no murmurs Lungs: CTA bilaterally, no wheezes, rhonchi, or rales Abdomen: +bs, soft, non tender, non distended, no masses, no hepatomegaly, no splenomegaly Musculoskeletal: nontender, no swelling, no obvious deformity Extremities: no edema, no cyanosis, no clubbing Pulses: 2+ symmetric, upper and lower extremities, normal cap refill Neurological: alert, oriented x 3, CN2-12 intact, strength normal upper extremities and lower extremities, sensation normal throughout, DTRs 2+ throughout, no cerebellar  signs, gait normal Psychiatric: normal affect, behavior normal, pleasant   Vicie Mutters, PA-C

## 2019-11-05 ENCOUNTER — Other Ambulatory Visit: Payer: Self-pay

## 2019-11-05 ENCOUNTER — Encounter: Payer: Self-pay | Admitting: Physician Assistant

## 2019-11-05 ENCOUNTER — Ambulatory Visit (INDEPENDENT_AMBULATORY_CARE_PROVIDER_SITE_OTHER): Payer: Medicare Other | Admitting: Physician Assistant

## 2019-11-05 VITALS — BP 136/70 | HR 76 | Temp 97.6°F | Wt 114.0 lb

## 2019-11-05 DIAGNOSIS — R5381 Other malaise: Secondary | ICD-10-CM

## 2019-11-05 DIAGNOSIS — E559 Vitamin D deficiency, unspecified: Secondary | ICD-10-CM

## 2019-11-05 DIAGNOSIS — E782 Mixed hyperlipidemia: Secondary | ICD-10-CM

## 2019-11-05 DIAGNOSIS — Z130 Encounter for screening for diseases of the blood and blood-forming organs and certain disorders involving the immune mechanism: Secondary | ICD-10-CM

## 2019-11-05 DIAGNOSIS — I10 Essential (primary) hypertension: Secondary | ICD-10-CM | POA: Diagnosis not present

## 2019-11-05 DIAGNOSIS — Z79899 Other long term (current) drug therapy: Secondary | ICD-10-CM | POA: Diagnosis not present

## 2019-11-05 DIAGNOSIS — D692 Other nonthrombocytopenic purpura: Secondary | ICD-10-CM

## 2019-11-05 DIAGNOSIS — E039 Hypothyroidism, unspecified: Secondary | ICD-10-CM | POA: Diagnosis not present

## 2019-11-05 DIAGNOSIS — R42 Dizziness and giddiness: Secondary | ICD-10-CM

## 2019-11-05 DIAGNOSIS — R5383 Other fatigue: Secondary | ICD-10-CM

## 2019-11-05 DIAGNOSIS — E611 Iron deficiency: Secondary | ICD-10-CM

## 2019-11-05 NOTE — Patient Instructions (Addendum)
Continue prilosec 20 mg take both in the morning 30 mins before food and take famotidine/pepcid at night.  Follow up with GI Go to the ER if any chest pain, shortness of breath, nausea, dizziness, severe HA, changes vision/speech   Silent reflux: Not all heartburn burns...Marland KitchenMarland KitchenMarland Kitchen  What is LPR? Laryngopharyngeal reflux (LPR) or silent reflux is a condition in which acid that is made in the stomach travels up the esophagus (swallowing tube) and gets to the throat. Not everyone with reflux has a lot of heartburn or indigestion. In fact, many people with LPR never have heartburn. This is why LPR is called SILENT REFLUX, and the terms "Silent reflux" and "LPR" are often used interchangeably. Because LPR is silent, it is sometimes difficult to diagnose.  How can you tell if you have LPR?  Marland Kitchen Chronic hoarseness- Some people have hoarseness that comes and goes . throat clearing  . Cough . It can cause shortness of breath and cause asthma like symptoms. Marland Kitchen a feeling of a lump in the throat  . difficulty swallowing . a problem with too much nose and throat drainage.  . Some people will feel their esophagus spasm which feels like their heart beating hard and fast, this will usually be after a meal, at rest, or lying down at night.    How do I treat this? Treatment for LPR should be individualized, and your doctor will suggest the best treatment for you. Generally there are several treatments for LPR: . changing habits and diet to reduce reflux,  . medications to reduce stomach acid, and  . surgery to prevent reflux. Most people with LPR need to modify how and when they eat, as well as take some medication, to get well. Sometimes, nonprescription liquid antacids, such as Maalox, Gelucil and Mylanta are recommended. When used, these antacids should be taken four times each day - one tablespoon one hour after each meal and before bedtime. Dietary and lifestyle changes alone are not often enough to control  LPR - medications that reduce stomach acid are also usually needed. These must be prescribed by our doctor.   TIPS FOR REDUCING REFLUX AND LPR Control your LIFE-STYLE and your DIET! Marland Kitchen If you use tobacco, QUIT.  Marland Kitchen Smoking makes you reflux. After every cigarette you have some LPR.  . Don't wear clothing that is too tight, especially around the waist (trousers, corsets, belts).  . Do not lie down just after eating...in fact, do not eat within three hours of bedtime.  . You should be on a low-fat diet.  . Limit your intake of red meat.  . Limit your intake of butter.  Marland Kitchen Avoid fried foods.  . Avoid chocolate  . Avoid cheese.  Marland Kitchen Avoid eggs. Marland Kitchen Specifically avoid caffeine (especially coffee and tea), soda pop (especially cola) and mints.  . Avoid alcoholic beverages, particularly in the evening. Ask insurance and pharmacy about shingrix - new vaccine   Can go to [employer] for more information  Shingrix Vaccination  Two vaccines are licensed and recommended to prevent shingles in the [place] Zoster vaccine live (ZVL, Zostavax) has been in use since 2006. Recombinant zoster vaccine (RZV, Shingrix), has been in use since 2017 and is recommended by AC[employer] as the preferred shingles vaccine.  What Everyone Should Know about Shingles Vaccine (Shingrix) One of the Recommended Vaccines by Disease Shingles vaccination is the only way to protect against shingles and postherpetic neuralgia (PHN), the most common complication from shingles. CDC recommends that healthy adults  50 years and older get two doses of the shingles vaccine called Shingrix (recombinant zoster vaccine), separated by 2 to 6 months, to prevent shingles and the complications from the disease. Your doctor or pharmacist can give you Shingrix as a shot in your upper arm. Shingrix provides strong protection against shingles and PHN. Two doses of Shingrix is more than 90% effective at  preventing shingles and PHN. Protection stays above 85% for at least the first four years after you get vaccinated. Shingrix is the preferred vaccine, over Zostavax (zoster vaccine live), a shingles vaccine in use since 2006. Zostavax may still be used to prevent shingles in healthy adults 60 years and older. For example, you could use Zostavax if a person is allergic to Shingrix, prefers Zostavax, or requests immediate vaccination and Shingrix is unavailable. Who Should Get Shingrix? Healthy adults 50 years and older should get two doses of Shingrix, separated by 2 to 6 months. You should get Shingrix even if in the past you . had shingles  . received Zostavax  . are not sure if you had chickenpox There is no maximum age for getting Shingrix. If you had shingles in the past, you can get Shingrix to help prevent future occurrences of the disease. There is no specific length of time that you need to wait after having shingles before you can receive Shingrix, but generally you should make sure the shingles rash has gone away before getting vaccinated. You can get Shingrix whether or not you remember having had chickenpox in the past. Studies show that more than 99% of Americans 40 years and older have had chickenpox, even if they don't remember having the disease. Chickenpox and shingles are related because they are caused by the same virus (varicella zoster virus). After a person recovers from chickenpox, the virus stays dormant (inactive) in the body. It can reactivate years later and cause shingles. If you had Zostavax in the recent past, you should wait at least eight weeks before getting Shingrix. Talk to your healthcare provider to determine the best time to get Shingrix. Shingrix is available in [facility] and pharmacies. To find doctor's offices or pharmacies near you that offer the vaccine, visit HealthMap Vaccine FinderExternal. If you have questions about Shingrix, talk with your  healthcare provider. Vaccine for Those 38 Years and Older  Shingrix reduces the risk of shingles and PHN by more than 90% in people 96 and older. CDC recommends the vaccine for healthy adults 5 and older.  Who Should Not Get Shingrix? You should not get Shingrix if you: . have ever had a severe allergic reaction to any component of the vaccine or after a dose of Shingrix  . tested negative for immunity to varicella zoster virus. If you test negative, you should get chickenpox vaccine.  . currently have shingles  . currently are pregnant or breastfeeding. Women who are pregnant or breastfeeding should wait to get Shingrix.  Marland Kitchen receive specific antiviral drugs (acyclovir, famciclovir, or valacyclovir) 24 hours before vaccination (avoid use of these antiviral drugs for 14 days after vaccination)- zoster vaccine live only If you have a minor acute (starts suddenly) illness, such as a cold, you may get Shingrix. But if you have a moderate or severe acute illness, you should usually wait until you recover before getting the vaccine. This includes anyone with a temperature of 101.3F or higher. The side effects of the Shingrix are temporary, and usually last 2 to 3 days. While you may experience pain for  a few days after getting Shingrix, the pain will be less severe than having shingles and the complications from the disease. How Well Does Shingrix Work? Two doses of Shingrix provides strong protection against shingles and postherpetic neuralgia (PHN), the most common complication of shingles. . In adults 54 to 78 years old who got two doses, Shingrix was 97% effective in preventing shingles; among adults 70 years and older, Shingrix was 91% effective.  . In adults 47 to 78 years old who got two doses, Shingrix was 91% effective in preventing PHN; among adults 70 years and older, Shingrix was 89% effective. Shingrix protection remained high (more than 85%) in people 70 years and older throughout the four  years following vaccination. Since your risk of shingles and PHN increases as you get older, it is important to have strong protection against shingles in your older years. Top of Page  What Are the Possible Side Effects of Shingrix? Studies show that Shingrix is safe. The vaccine helps your body create a strong defense against shingles. As a result, you are likely to have temporary side effects from getting the shots. The side effects may affect your ability to do normal daily activities for 2 to 3 days. Most people got a sore arm with mild or moderate pain after getting Shingrix, and some also had redness and swelling where they got the shot. Some people felt tired, had muscle pain, a headache, shivering, fever, stomach pain, or nausea. About 1 out of 6 people who got Shingrix experienced side effects that prevented them from doing regular activities. Symptoms went away on their own in about 2 to 3 days. Side effects were more common in younger people. You might have a reaction to the first or second dose of Shingrix, or both doses. If you experience side effects, you may choose to take over-the-counter pain medicine such as ibuprofen or acetaminophen. If you experience side effects from Shingrix, you should report them to the Vaccine Adverse Event Reporting System (VAERS). Your doctor might file this report, or you can do it yourself through the VAERS websiteExternal, or by calling [phone]. If you have any questions about side effects from Shingrix, talk with your doctor. The shingles vaccine does not contain thimerosal (a preservative containing mercury). Top of Page  When Should I See a Doctor Because of the Side Effects I Experience From Shingrix? In clinical trials, Shingrix was not associated with serious adverse events. In fact, serious side effects from vaccines are extremely rare. For example, for every 1 million doses of a vaccine given, only one or two people may have a severe allergic  reaction. Signs of an allergic reaction happen within minutes or hours after vaccination and include hives, swelling of the face and throat, difficulty breathing, a fast heartbeat, dizziness, or weakness. If you experience these or any other life-threatening symptoms, see a doctor right away. Shingrix causes a strong response in your immune system, so it may produce short-term side effects more intense than you are used to from other vaccines. These side effects can be uncomfortable, but they are expected and usually go away on their own in 2 or 3 days. Top of Page  How Can I Pay For Shingrix? There are several ways shingles vaccine may be paid for: Medicare . Medicare Part D plans cover the shingles vaccine, but there may be a cost to you depending on your plan. There may be a copay for the vaccine, or you may need to pay in full  then get reimbursed for a certain amount.  . Medicare Part B does not cover the shingles vaccine. Medicaid . Medicaid may or may not cover the vaccine. Contact your insurer to find out. Private health insurance . Many private health insurance plans will cover the vaccine, but there may be a cost to you depending on your plan. Contact your insurer to find out. Vaccine assistance programs . Some pharmaceutical companies provide vaccines to eligible adults who cannot afford them. You may want to check with the vaccine manufacturer, [employer], about Shingrix. If you do not currently have health insurance, learn more about affordable health coverage optionsExternal. To find doctor's offices or pharmacies near you that offer the vaccine, visit HealthMap Vaccine FinderExternal.

## 2019-11-06 ENCOUNTER — Other Ambulatory Visit: Payer: Self-pay | Admitting: Physician Assistant

## 2019-11-06 DIAGNOSIS — D469 Myelodysplastic syndrome, unspecified: Secondary | ICD-10-CM

## 2019-11-06 DIAGNOSIS — R5381 Other malaise: Secondary | ICD-10-CM

## 2019-11-06 LAB — COMPLETE METABOLIC PANEL WITH GFR
AG Ratio: 2.1 (calc) (ref 1.0–2.5)
ALT: 23 U/L (ref 6–29)
AST: 21 U/L (ref 10–35)
Albumin: 4.2 g/dL (ref 3.6–5.1)
Alkaline phosphatase (APISO): 35 U/L — ABNORMAL LOW (ref 37–153)
BUN: 15 mg/dL (ref 7–25)
CO2: 25 mmol/L (ref 20–32)
Calcium: 9.4 mg/dL (ref 8.6–10.4)
Chloride: 98 mmol/L (ref 98–110)
Creat: 0.87 mg/dL (ref 0.60–0.93)
GFR, Est African American: 74 mL/min/{1.73_m2} (ref >=60)
GFR, Est Non African American: 64 mL/min/{1.73_m2} (ref >=60)
Globulin: 2 g/dL (calc) (ref 1.9–3.7)
Glucose, Bld: 99 mg/dL (ref 65–99)
Potassium: 4.6 mmol/L (ref 3.5–5.3)
Sodium: 132 mmol/L — ABNORMAL LOW (ref 135–146)
Total Bilirubin: 0.7 mg/dL (ref 0.2–1.2)
Total Protein: 6.2 g/dL (ref 6.1–8.1)

## 2019-11-06 LAB — CBC WITH DIFFERENTIAL/PLATELET
ABSOLUTE MYELOCYTES: 176 cells/uL — ABNORMAL HIGH
Absolute Monocytes: 176 cells/uL — ABNORMAL LOW (ref 200–950)
Basophils Absolute: 0 cells/uL (ref 0–200)
Basophils Relative: 0 %
Eosinophils Absolute: 0 cells/uL — ABNORMAL LOW (ref 15–500)
Eosinophils Relative: 0 %
HCT: 41.9 % (ref 35.0–45.0)
Hemoglobin: 14.6 g/dL (ref 11.7–15.5)
Lymphs Abs: 792 cells/uL — ABNORMAL LOW (ref 850–3900)
MCH: 33.7 pg — ABNORMAL HIGH (ref 27.0–33.0)
MCHC: 34.8 g/dL (ref 32.0–36.0)
MCV: 96.8 fL (ref 80.0–100.0)
MPV: 10.3 fL (ref 7.5–12.5)
Monocytes Relative: 2 %
Myelocytes: 2 % — ABNORMAL HIGH
Neutro Abs: 7656 cells/uL (ref 1500–7800)
Neutrophils Relative %: 87 %
Platelets: 212 10*3/uL (ref 140–400)
RBC: 4.33 10*6/uL (ref 3.80–5.10)
RDW: 13.9 % (ref 11.0–15.0)
Total Lymphocyte: 9 %
WBC: 8.8 10*3/uL (ref 3.8–10.8)

## 2019-11-06 LAB — IRON, TOTAL/TOTAL IRON BINDING CAP
%SAT: 30 % (calc) (ref 16–45)
Iron: 106 ug/dL (ref 45–160)
TIBC: 351 mcg/dL (calc) (ref 250–450)

## 2019-11-06 LAB — LIPID PANEL
Cholesterol: 235 mg/dL — ABNORMAL HIGH (ref <200)
HDL: 78 mg/dL (ref >=50)
LDL Cholesterol (Calc): 136 mg/dL (calc) — ABNORMAL HIGH
Non-HDL Cholesterol (Calc): 157 mg/dL (calc) — ABNORMAL HIGH (ref <130)
Total CHOL/HDL Ratio: 3 (calc) (ref <5.0)
Triglycerides: 106 mg/dL (ref <150)

## 2019-11-06 LAB — TSH: TSH: 0.49 mIU/L (ref 0.40–4.50)

## 2019-11-06 LAB — FERRITIN: Ferritin: 88 ng/mL (ref 16–288)

## 2019-11-06 LAB — VITAMIN D 25 HYDROXY (VIT D DEFICIENCY, FRACTURES): Vit D, 25-Hydroxy: 40 ng/mL (ref 30–100)

## 2019-11-06 LAB — MAGNESIUM: Magnesium: 2.1 mg/dL (ref 1.5–2.5)

## 2019-11-09 ENCOUNTER — Telehealth: Payer: Self-pay | Admitting: Nurse Practitioner

## 2019-11-09 NOTE — Telephone Encounter (Signed)
A new hem appt has been scheduled for [name] to see Wilnette Kales on 1/4 at 1:45pm. Pt aware to arrive 15 minutes early.

## 2019-11-23 ENCOUNTER — Other Ambulatory Visit: Payer: Self-pay

## 2019-11-23 ENCOUNTER — Inpatient Hospital Stay: Payer: Medicare Other | Attending: Nurse Practitioner | Admitting: Nurse Practitioner

## 2019-11-23 ENCOUNTER — Inpatient Hospital Stay: Payer: Medicare Other

## 2019-11-23 VITALS — BP 140/86 | HR 75 | Temp 97.8°F | Resp 17 | Ht 64.5 in | Wt 114.6 lb

## 2019-11-23 DIAGNOSIS — Z85828 Personal history of other malignant neoplasm of skin: Secondary | ICD-10-CM | POA: Insufficient documentation

## 2019-11-23 DIAGNOSIS — R7989 Other specified abnormal findings of blood chemistry: Secondary | ICD-10-CM

## 2019-11-23 DIAGNOSIS — Z833 Family history of diabetes mellitus: Secondary | ICD-10-CM

## 2019-11-23 DIAGNOSIS — Z8249 Family history of ischemic heart disease and other diseases of the circulatory system: Secondary | ICD-10-CM | POA: Diagnosis not present

## 2019-11-23 DIAGNOSIS — F419 Anxiety disorder, unspecified: Secondary | ICD-10-CM

## 2019-11-23 DIAGNOSIS — Z823 Family history of stroke: Secondary | ICD-10-CM

## 2019-11-23 DIAGNOSIS — I10 Essential (primary) hypertension: Secondary | ICD-10-CM | POA: Insufficient documentation

## 2019-11-23 DIAGNOSIS — Z87891 Personal history of nicotine dependence: Secondary | ICD-10-CM | POA: Diagnosis not present

## 2019-11-23 DIAGNOSIS — M1990 Unspecified osteoarthritis, unspecified site: Secondary | ICD-10-CM | POA: Diagnosis not present

## 2019-11-23 DIAGNOSIS — Z8051 Family history of malignant neoplasm of kidney: Secondary | ICD-10-CM | POA: Insufficient documentation

## 2019-11-23 DIAGNOSIS — Z803 Family history of malignant neoplasm of breast: Secondary | ICD-10-CM | POA: Diagnosis not present

## 2019-11-23 DIAGNOSIS — E039 Hypothyroidism, unspecified: Secondary | ICD-10-CM

## 2019-11-23 DIAGNOSIS — E785 Hyperlipidemia, unspecified: Secondary | ICD-10-CM | POA: Diagnosis not present

## 2019-11-23 DIAGNOSIS — Z800 Family history of malignant neoplasm of digestive organs: Secondary | ICD-10-CM | POA: Diagnosis not present

## 2019-11-23 DIAGNOSIS — Z8349 Family history of other endocrine, nutritional and metabolic diseases: Secondary | ICD-10-CM | POA: Insufficient documentation

## 2019-11-23 DIAGNOSIS — K2100 Gastro-esophageal reflux disease with esophagitis, without bleeding: Secondary | ICD-10-CM | POA: Insufficient documentation

## 2019-11-23 DIAGNOSIS — R5383 Other fatigue: Secondary | ICD-10-CM | POA: Diagnosis not present

## 2019-11-23 DIAGNOSIS — Z882 Allergy status to sulfonamides status: Secondary | ICD-10-CM | POA: Diagnosis not present

## 2019-11-23 DIAGNOSIS — K529 Noninfective gastroenteritis and colitis, unspecified: Secondary | ICD-10-CM

## 2019-11-23 DIAGNOSIS — M8580 Other specified disorders of bone density and structure, unspecified site: Secondary | ICD-10-CM | POA: Diagnosis not present

## 2019-11-23 DIAGNOSIS — Z79899 Other long term (current) drug therapy: Secondary | ICD-10-CM

## 2019-11-23 LAB — CBC WITH DIFFERENTIAL (CANCER CENTER ONLY)
Abs Immature Granulocytes: 0.05 10*3/uL (ref 0.00–0.07)
Basophils Absolute: 0 10*3/uL (ref 0.0–0.1)
Basophils Relative: 0 %
Eosinophils Absolute: 0 10*3/uL (ref 0.0–0.5)
Eosinophils Relative: 1 %
HCT: 41.1 % (ref 36.0–46.0)
Hemoglobin: 13.7 g/dL (ref 12.0–15.0)
Immature Granulocytes: 1 %
Lymphocytes Relative: 20 %
Lymphs Abs: 1.1 10*3/uL (ref 0.7–4.0)
MCH: 33 pg (ref 26.0–34.0)
MCHC: 33.3 g/dL (ref 30.0–36.0)
MCV: 99 fL (ref 80.0–100.0)
Monocytes Absolute: 0.5 10*3/uL (ref 0.1–1.0)
Monocytes Relative: 9 %
Neutro Abs: 3.8 10*3/uL (ref 1.7–7.7)
Neutrophils Relative %: 69 %
Platelet Count: 195 10*3/uL (ref 150–400)
RBC: 4.15 MIL/uL (ref 3.87–5.11)
RDW: 14.1 % (ref 11.5–15.5)
WBC Count: 5.4 10*3/uL (ref 4.0–10.5)
nRBC: 0 % (ref 0.0–0.2)

## 2019-11-23 LAB — SAVE SMEAR(SSMR), FOR PROVIDER SLIDE REVIEW

## 2019-11-23 NOTE — Progress Notes (Addendum)
[place]  Telephone:(336) [phone] Fax:([phone]) [place] consult Note   Patient Care Team: Unk Pinto, MD as PCP - General (Internal Medicine) Date of Service: 11/23/2019   CHIEF COMPLAINTS/PURPOSE OF CONSULTATION:  Myeloid abnormality on CBC, referred by Vicie Mutters, PA-C  HISTORY OF PRESENTING ILLNESS:  Debbie Houston 79 y.o. female with h/o HTN, HL, GERD, hypothyroid, pre-DM, positive ANA, and IBD subtype "collagenous colitis" on daily budesonide and recently completed steroid taper ~10/29/19. She developed gradual fatigue over last few months and PCP obtained CBC on 11/05/19 showing normal WBC, Hg, PLT but with elevated absolute myelocytes 176, 2% myelocytes, low abs lymphocytes 792, abs monocytes 176, and 0 Eosinophils. Previous CBC on 08/03/19 was normal. She had a remote anemia during back surgery in 11/2018 that resolved.   Socially she is married, lives with spouse and 1 son. She has another son who is autistic and lives in a group home. She is retired Oncologist. She was more active before COVID doing water aerobics but has been less active lately. Drinks wine occasionally. Smoked remotely many years ago. Denies drug use. She is reportedly up to date on personal cancer screenings, she has had skin cancer removed from her face. Family history is positive for breast cancer in her mother, father (metastatic to brain), and sister, and renal cancer in [facility].   Today, she presents to clinic alone. She feels well other than gradual moderate fatigue. She remains functional and able to do ADLs. She thinks it might be related to less physical activity lately and COVID19 situation. She denies fever, sweats, weight loss. She had recent colitis flare with diarrhea, completed steroid taper 1 week before 11/05/19 labs and continues budesonide daily. She has scattered bruising from aspirin. Denies rash. Denies recent infection, adenopathy.     MEDICAL HISTORY:   Past Medical History:  Diagnosis Date  . Arthritis   . Benign labile hypertension   . Cancer ([place])    skin cancer on face  . Colitis   . DJD (degenerative joint disease)   . Endometrial polyp   . GERD (gastroesophageal reflux disease)   . History of basal cell carcinoma excision   . History of esophagitis   . HOH (hard of hearing)    right ear better than left  -- wears no aides  . Hyperlipidemia   . Hypothyroidism   . Osteopenia   . Prediabetes   . Vitamin D deficiency   . Wears glasses     SURGICAL HISTORY: Past Surgical History:  Procedure Laterality Date  . DILATATION & CURETTAGE/HYSTEROSCOPY WITH MYOSURE N/A 10/27/2014   Procedure: DILATATION & CURETTAGE/HYSTEROSCOPY WITH MYOSURE;  Surgeon: Darlyn Chamber, MD;  Location: [place];  Service: Gynecology;  Laterality: N/A;  . DILATION AND CURETTAGE OF UTERUS    . EYE SURGERY     bilateral  . MOHS SURGERY  2005   LEFT NASAL BRIDGE FOR BASAL CELL  . TONSILLECTOMY  age 54    SOCIAL HISTORY: Social History   Socioeconomic History  . Marital status: Married    Spouse name: Not on file  . Number of children: Not on file  . Years of education: Not on file  . Highest education level: Not on file  Occupational History  . Not on file  Tobacco Use  . Smoking status: Former Smoker    Years: 15.00    Types: Cigarettes  . Smokeless tobacco: Never Used  Substance and Sexual Activity  .  Alcohol use: Yes    Comment: OCCASIONAL  . Drug use: No  . Sexual activity: Yes  Other Topics Concern  . Not on file  Social History Narrative  . Not on file   Social Determinants of Health   Financial Resource Strain:   . Difficulty of Paying Living Expenses: Not on file  Food Insecurity:   . Worried About Charity fundraiser in the Last Year: Not on file  . Ran Out of Food in the Last Year: Not on file  Transportation Needs:   . Lack of Transportation (Medical): Not on file  . Lack of Transportation  (Non-Medical): Not on file  Physical Activity:   . Days of Exercise per Week: Not on file  . Minutes of Exercise per Session: Not on file  Stress:   . Feeling of Stress : Not on file  Social Connections:   . Frequency of Communication with Friends and Family: Not on file  . Frequency of Social Gatherings with Friends and Family: Not on file  . Attends Religious Services: Not on file  . Active Member of Clubs or Organizations: Not on file  . Attends Archivist Meetings: Not on file  . Marital Status: Not on file  Intimate Partner Violence:   . Fear of Current or Ex-Partner: Not on file  . Emotionally Abused: Not on file  . Physically Abused: Not on file  . Sexually Abused: Not on file    FAMILY HISTORY: Family History  Problem Relation Age of Onset  . Hypertension Mother   . Cancer Mother        COLON  . Hyperlipidemia Mother   . Cancer Father        BREAST WITH BRAIN METS  . Hyperlipidemia Father   . Cancer Sister        COLON (TEENS), BREAST (40)  . Hyperlipidemia Sister   . Diabetes Brother   . Hyperlipidemia Brother   . Cancer Maternal Grandmother        RENAL  . Hyperlipidemia Maternal Grandmother   . Diabetes Maternal Grandfather   . Stroke Maternal Grandfather   . Hyperlipidemia Maternal Grandfather   . Hyperlipidemia Paternal Grandmother   . Hyperlipidemia Paternal Grandfather     ALLERGIES:  is allergic to fosamax [alendronate sodium]; singulair [montelukast sodium]; wellbutrin [bupropion]; and sulfa antibiotics.  MEDICATIONS:  Current Outpatient Medications  Medication Sig Dispense Refill  . acetaminophen (TYLENOL) 500 MG tablet Take 500 mg by mouth every 6 (six) hours as needed.    . Ascorbic Acid (VITAMIN C) 1000 MG tablet Take 1,000 mg by mouth daily.     Marland Kitchen aspirin EC 81 MG tablet Take 81 mg by mouth daily.     . bisoprolol-hydrochlorothiazide (ZIAC) 2.5-6.25 MG tablet Take 1 tablet daily for BP (Patient taking differently: 1 tablet daily. )  90 tablet 3  . budesonide (ENTOCORT EC) 3 MG 24 hr capsule Take 3 mg by mouth daily.    . Calcium Carbonate-Vitamin D (CALCIUM 600+D HIGH POTENCY) 600-400 MG-UNIT tablet Take 1 tablet by mouth daily.    . Cinnamon 500 MG TABS Take 1,000 mg by mouth daily.     . Coenzyme Q10 (COQ10) 200 MG CAPS Take 200 mg by mouth daily.    Marland Kitchen levothyroxine (SYNTHROID) 125 MCG tablet Take one tablet on Sun, Mon, Wed & Fri and 1/2 tablet all other days 90 tablet 1  . Multiple Vitamins-Minerals (MULTIVITAMIN WITH MINERALS) tablet Take 1 tablet by mouth daily.    Marland Kitchen  omeprazole (PRILOSEC) 20 MG capsule Take 1 capsule (20 mg total) by mouth 2 (two) times daily. 120 capsule 1  . OVER THE COUNTER MEDICATION Vein Health 1 tablet daily.    Vladimir Faster Glycol-Propyl Glycol (SYSTANE) 0.4-0.3 % SOLN Place 1 drop into both eyes 2 (two) times daily as needed (for dry eyes).    . Probiotic Product (PROBIOTIC PO) Take 1 capsule by mouth daily.    . simvastatin (ZOCOR) 20 MG tablet TAKE 1 TABLET BY MOUTH DAILY 90 tablet 0   No current facility-administered medications for this visit.    REVIEW OF SYSTEMS:   Constitutional: Denies fevers, chills, weight loss, or abnormal night sweats (+) gradual moderate fatigue  Ears, nose, mouth, throat, and face: Denies mucositis or sore throat Respiratory: Denies cough, dyspnea or wheezes Cardiovascular: Denies palpitation, chest discomfort or lower extremity swelling Gastrointestinal:  Denies nausea, vomiting, constipation, change in bowel habits, bleeding (+) colitis, recent diarrhea flare (+) GERD Skin: Denies abnormal skin rashes  Lymphatics: Denies new lymphadenopathy (+) easy bruising Neurological:Denies numbness, tingling or new weaknesses Behavioral/Psych: Mood is stable, no new changes (+) anxiety  All other systems were reviewed with the patient and are negative.  PHYSICAL EXAMINATION: ECOG PERFORMANCE STATUS: 1 - Symptomatic but completely ambulatory  Vitals:   11/23/19  1405  BP: 140/86  Pulse: 75  Resp: 17  Temp: 97.8 F (36.6 C)  SpO2: 100%   Filed Weights   11/23/19 1405  Weight: 114 lb 9.6 oz (52 kg)    GENERAL:alert, no distress and comfortable SKIN: no rash. Scattered bruising to legs and arms bilaterally EYES:  sclera clear OROPHARYNX: no ecchymosis, thrush, or ulcers  NECK: without mass LYMPH:  no palpable cervical, supraclavicular, or axillary lymphadenopathy  LUNGS: clear with normal breathing effort HEART: regular rate & rhythm, no lower extremity edema ABDOMEN: abdomen soft, non-tender and normal bowel sounds PSYCH: alert & oriented x 3 with fluent speech NEURO: no focal motor/sensory deficits  LABORATORY DATA:  I have reviewed the data as listed CBC Latest Ref Rng & Units 11/23/2019 11/05/2019 08/03/2019  WBC 4.0 - 10.5 K/uL 5.4 8.8 7.3  Hemoglobin 12.0 - 15.0 g/dL 13.7 14.6 13.6  Hematocrit 36.0 - 46.0 % 41.1 41.9 39.9  Platelets 150 - 400 K/uL 195 212 230    CMP Latest Ref Rng & Units 11/05/2019 08/03/2019 04/07/2019  Glucose 65 - 99 mg/dL 99 123(H) 122(H)  BUN 7 - 25 mg/dL 15 11 16   Creatinine 0.60 - 0.93 mg/dL 0.87 0.87 0.77  Sodium 135 - 146 mmol/L 132(L) 132(L) 137  Potassium 3.5 - 5.3 mmol/L 4.6 3.9 4.4  Chloride 98 - 110 mmol/L 98 99 101  CO2 20 - 32 mmol/L 25 26 27   Calcium 8.6 - 10.4 mg/dL 9.4 9.1 9.4  Total Protein 6.1 - 8.1 g/dL 6.2 5.9(L) 5.9(L)  Total Bilirubin 0.2 - 1.2 mg/dL 0.7 0.5 0.4  Alkaline Phos 33 - 130 U/L - - -  AST 10 - 35 U/L 21 21 22   ALT 6 - 29 U/L 23 16 17      RADIOGRAPHIC STUDIES: I have personally reviewed the radiological images as listed and agreed with the findings in the report. No results found.  ASSESSMENT & PLAN: 79 yo female with   1. Abnormal CBC -We reviewed her medical record and recent labs in detail with the patient. She had a normal CBC 3 months ago. In 11/05/2019 she was found to have elevated myelocytes in the peripheral blood. PCP referred  her to [place] to evaluate her  labs findings coupled with progressive fatigue.  -We reviewed the CBC in general and bone marrow function.  -She denies active infection. However she does have colitis, and recently completed a steroid taper one week prior to December labs, she continues daily budesonide. Systemic absorption of steroids can cause myelocytes in peripheral blood. A myeloproliferative disorder such as CML is also possible but less likely.  -Her repeat CBC today is WNL, smear shows rare meta and myelocytes present. No need to check BCR-ABL. Presence of myelocytes was likely related to recent steroids -she can f/u with her PCP in the future, will see her back as needed.   2. Family history of breast cancer -noted in her mother, sister, and father who had metastasis to his brain. She qualifies for genetic testing.  -she has 2 sons and a granddaughter. We reviewed certain inheritable mutations can predispose not only women but men to breast cancer and other cancers such as prostate, pancreas -she will think about it and let us know if she wants to pursue genetic testing. She is otherwise UTD on age-appropriate cancer screenings.   PLAN: -Medical record and repeat labs reviewed, normal CBC today -Abnormal finding was likely related to recent steroids -Return to PCP -F/u open -Patient will consider genetic testing and let us know if she would like to proceed    Orders Placed This Encounter  Procedures  . Save Smear (SSMR)    Standing Status:   Future    Number of Occurrences:   1    Standing Expiration Date:   11/22/2020  . CBC with Differential (Cancer Center Only)    Standing Status:   Future    Number of Occurrences:   1    Standing Expiration Date:   11/22/2020  . Draw extra clot specimen    Standing Status:   Future    Number of Occurrences:   1    Standing Expiration Date:   11/22/2020    All questions were answered. The patient knows to call the clinic with any problems, questions or concerns. I spent 45  minutes counseling the patient face to face. The total time spent in the appointment was 60 minutes and more than 50% was on counseling.     Alla Feeling, NP 11/24/19 3:00 PM   Addendum  I have seen the patient, examined her. I agree with the assessment and and plan and have edited the notes.   I have reviewed her outside lab results. Her recent CBC showed normal CBC, with increased myelocytes, no increased eosin and basophils, no high suspicion for CML. I think this is likely related to her previous oral steroids. I will repeat her CBC today, if WBC and diff all normal, I do not think she needs further work up. If still has elevated myelocytes, will check bcr/abl to rule out CML.   Truitt Merle  11/23/2019

## 2019-11-24 ENCOUNTER — Encounter: Payer: Self-pay | Admitting: Nurse Practitioner

## 2019-12-03 DIAGNOSIS — A09 Infectious gastroenteritis and colitis, unspecified: Secondary | ICD-10-CM | POA: Diagnosis not present

## 2019-12-03 DIAGNOSIS — K2100 Gastro-esophageal reflux disease with esophagitis, without bleeding: Secondary | ICD-10-CM | POA: Diagnosis not present

## 2019-12-08 ENCOUNTER — Other Ambulatory Visit: Payer: Self-pay | Admitting: Physician Assistant

## 2019-12-08 ENCOUNTER — Other Ambulatory Visit: Payer: Self-pay | Admitting: Internal Medicine

## 2019-12-08 DIAGNOSIS — I10 Essential (primary) hypertension: Secondary | ICD-10-CM

## 2019-12-08 MED ORDER — BISOPROLOL-HYDROCHLOROTHIAZIDE 2.5-6.25 MG PO TABS: ORAL_TABLET | ORAL | 3 refills | Status: DC

## 2019-12-09 ENCOUNTER — Encounter: Payer: Self-pay | Admitting: Physician Assistant

## 2019-12-09 ENCOUNTER — Ambulatory Visit (INDEPENDENT_AMBULATORY_CARE_PROVIDER_SITE_OTHER): Payer: Medicare Other | Admitting: Physician Assistant

## 2019-12-09 ENCOUNTER — Other Ambulatory Visit: Payer: Self-pay

## 2019-12-09 VITALS — BP 120/70 | HR 77 | Temp 97.3°F | Wt 115.0 lb

## 2019-12-09 DIAGNOSIS — R768 Other specified abnormal immunological findings in serum: Secondary | ICD-10-CM | POA: Diagnosis not present

## 2019-12-09 DIAGNOSIS — M79642 Pain in left hand: Secondary | ICD-10-CM | POA: Diagnosis not present

## 2019-12-09 DIAGNOSIS — R519 Headache, unspecified: Secondary | ICD-10-CM | POA: Diagnosis not present

## 2019-12-09 DIAGNOSIS — R5383 Other fatigue: Secondary | ICD-10-CM

## 2019-12-09 DIAGNOSIS — M79641 Pain in right hand: Secondary | ICD-10-CM | POA: Diagnosis not present

## 2019-12-09 DIAGNOSIS — R1013 Epigastric pain: Secondary | ICD-10-CM | POA: Diagnosis not present

## 2019-12-09 DIAGNOSIS — E039 Hypothyroidism, unspecified: Secondary | ICD-10-CM | POA: Diagnosis not present

## 2019-12-09 DIAGNOSIS — R350 Frequency of micturition: Secondary | ICD-10-CM | POA: Diagnosis not present

## 2019-12-09 DIAGNOSIS — R5381 Other malaise: Secondary | ICD-10-CM

## 2019-12-09 DIAGNOSIS — R42 Dizziness and giddiness: Secondary | ICD-10-CM | POA: Diagnosis not present

## 2019-12-09 MED ORDER — MUPIROCIN 2 % EX OINT: 1.0000 "application " | TOPICAL_OINTMENT | Freq: Two times a day (BID) | CUTANEOUS | 0 refills | Status: DC

## 2019-12-09 NOTE — Patient Instructions (Addendum)
She was informed to call 911 if she develop any new symptoms such as worsening headaches, episodes of blurred vision, double vision or complete loss of vision or speech difficulties or motor weakness.   Your ears and sinuses are connected by the eustachian tube. When your sinuses are inflamed, this can close off the tube and cause fluid to collect in your middle ear. This can then cause dizziness, popping, clicking, ringing, and echoing in your ears. This is often NOT an infection and does NOT require antibiotics, it is caused by inflammation so the treatments help the inflammation. This can take a long time to get better so please be patient.  Here are things you can do to help with this: - Try the Flonase or Nasonex. Remember to spray each nostril once, can do every other night, towards the outer part of your eye.  Do not sniff but instead pinch your nose and tilt your head back to help the medicine get into your sinuses.  The best time to do this is at bedtime.Stop if you get blurred vision. Will add on mupirocin ointment.  -While drinking fluids, pinch and hold nose close and swallow, to help open eustachian tubes to drain fluid behind ear drums. -Please pick one of the over the counter allergy medications below and take it once daily for allergies.  It will also help with fluid behind ear drums. Claritin or loratadine cheapest but likely the weakest  Zyrtec or certizine at night because it can make you sleepy The strongest is allegra or fexafinadine  Cheapest at [employer], [employer], [employer] -can use decongestant over the counter, please do not use if you have high blood pressure or certain heart conditions.   if worsening HA, changes vision/speech, imbalance, weakness go to the ER  What is the TMJ? The temporomandibular (tem-PUH-ro-man-DIB-yoo-ler) joint, or the TMJ, connects the upper and lower jawbones. This joint allows the jaw to open wide and move back and forth when you chew, talk, or  yawn.There are also several muscles that help this joint move. There can be muscle tightness and pain in the muscle that can cause several symptoms.  Check with dentist  What causes TMJ pain? There are many causes of TMJ pain. Repeated chewing (for example, chewing gum) and clenching your teeth can cause pain in the joint. Some TMJ pain has no obvious cause. What can I do to ease the pain? There are many things you can do to help your pain get better. When you have pain:  Eat soft foods and stay away from chewy foods (for example, taffy) Try to use both sides of your mouth to chew Don't chew gum Massage Don't open your mouth wide (for example, during yawning or singing) Don't bite your cheeks or fingernails Lower your amount of stress and worry Applying a warm, damp washcloth to the joint may help. Over-the-counter pain medicines such as ibuprofen (one brand: Advil) or acetaminophen (one brand: Tylenol) might also help. Do not use these medicines if you are allergic to them or if your doctor told you not to use them. How can I stop the pain from coming back? When your pain is better, you can do these exercises to make your muscles stronger and to keep the pain from coming back:  Resisted mouth opening: Place your thumb or two fingers under your chin and open your mouth slowly, pushing up lightly on your chin with your thumb. Hold for three to six seconds. Close your mouth slowly. Resisted  mouth closing: Place your thumbs under your chin and your two index fingers on the ridge between your mouth and the bottom of your chin. Push down lightly on your chin as you close your mouth. Tongue up: Slowly open and close your mouth while keeping the tongue touching the roof of the mouth. Side-to-side jaw movement: Place an object about one fourth of an inch thick (for example, two tongue depressors) between your front teeth. Slowly move your jaw from side to side. Increase the thickness of the object as  the exercise becomes easier Forward jaw movement: Place an object about one fourth of an inch thick between your front teeth and move the bottom jaw forward so that the bottom teeth are in front of the top teeth. Increase the thickness of the object as the exercise becomes easier. These exercises should not be painful. If it hurts to do these exercises, stop doing them and talk to your family doctor.

## 2019-12-09 NOTE — Progress Notes (Signed)
FOLLOW UP  Assessment:    Malaise and fatigue -     C-reactive protein -     Sedimentation rate - check TSH - has been ongoing Normal iron, normal B12 - no tick exposure - negative autoimmune work up other than + ANA which is non specific  Hypothyroidism, unspecified type -     CBC with Differential/Platelet -     COMPLETE METABOLIC PANEL WITH GFR -     TSH Hypothyroidism-check TSH level, continue medications the same, reminded to take on an empty stomach 30-66mns before food.   Vertigo/nasal congestion - normal neuro, has seen ENT, get back on flonase but cut back on frequency, use saline nasal spray, use ointment- can follow up with GI -     mupirocin ointment (BACTROBAN) 2 %; Place 1 application into the nose 2 (two) times daily. - some pain in palpation bilateral temples will get CRP/sed rate  Pain in both hands -     Ambulatory referral to Rheumatology Normal iron, normal B12 - no tick exposure - negative autoimmune work up other than + ANA which is non specific however with history  Will send to rheum for second opinion.   Urinary frequency -     Urinalysis, Routine w reflex microscopic -     Urine Culture - rule out UTI  Temple tenderness Bilateral pain- very unlikely TA but with symptoms will rule out More likely TMJ/sinus congestion Get back on flonase, massage, heating pad Normal neuro -     C-reactive protein -     Sedimentation rate  Epigastric pain -     Amylase -     Lipase - continue follow up with GI - other than sinus congestion/HA, epigastric pain is the only other symptoms patient is complaining of with fatigue. Will check pacreatic enzymes and LFTs, may need imaging/referral back to GI  The patient was advised to call immediately if she has any concerning symptoms in the interval. The patient voices understanding of current treatment options and is in agreement with the current care plan.The patient knows to call the clinic with any problems,  questions or concerns or go to the ER if any further progression of symptoms.    Over 30 minutes of exam, counseling, chart review, and critical decision making was performed  Future Appointments Future Appointments  Date Time Provider [place] 12/10/2019 11:00 AM [place] 02/03/2020  2:30 PM MUnk Pinto MD [facility] None  05/11/2020  2:00 PM CVicie Mutters PA-C [facility] None      Subjective:   Debbie HEIDENis a 79y.o. female who presents for follow up of dizziness and multitude of symptoms since having her back surgery/close to a year.   She has had dizziness, fatigue, hair loss, and generally not feeling well.   Has seen ENT for hearing loss/dizziness, saw Dr. SWilburn Corneliaon 10/05/2019, has been on flonase but stopped due to slightly bloody nose. She now has congestion, decreased hearing, ear fullness, ear pain, temporal pain that is worse on the right side.  [facility]   She continue to have dizziness, worse with standing.  She also states she is constantly tired and more fatigued than usual, has been having bilateral hand pain, positive ANA, negative CCP and RF, discussed with patient that ANA is not a specific test however with autoimmune history with colitis/TSH and bilateral hand pain, she is going to see  rheumotology.  Did trial of prednisone and states she did not see a difference, she saw oncology for abnormal CBC/myleoid dysplasia but that was due to the prednisone use.  Normal MRI 2016 brain Never MRI of her neck She had normal B12, normal Iron WITH supplementation. Normal mono. Normal TSH, T4.   She has epigastric pain, GERD, last EGD 2016, had phone visit with GI, stopped pepcid and prilosec and is back on protonix. She has been having diarrhea 4-5 x a day, will wake her at night, she is having to nocturia at night. She is on endocort.   Her blood pressure has been  controlled at home, she is on BP every other day, today their BP is BP: 120/70 Lab Results  Component Value Date   IRON 106 11/05/2019   TIBC 351 11/05/2019   FERRITIN 88 11/05/2019   Lab Results  Component Value Date   TSH 0.49 11/05/2019    Medication Review  Current Outpatient Medications (Endocrine & Metabolic):  .  budesonide (ENTOCORT EC) 3 MG 24 hr capsule, Take 3 mg by mouth daily. Marland Kitchen  levothyroxine (SYNTHROID) 125 MCG tablet, Take 1 tablet daily on an empty stomach with only water for 30 minutes & no Antacid meds, Calcium or Magnesium for 4 hours & avoid Biotin  Current Outpatient Medications (Cardiovascular):  .  bisoprolol-hydrochlorothiazide (ZIAC) 2.5-6.25 MG tablet, Take 1 tablet Daily for BP .  simvastatin (ZOCOR) 20 MG tablet, TAKE 1 TABLET BY MOUTH DAILY   Current Outpatient Medications (Analgesics):  .  acetaminophen (TYLENOL) 500 MG tablet, Take 500 mg by mouth every 6 (six) hours as needed. Marland Kitchen  aspirin EC 81 MG tablet, Take 81 mg by mouth daily.    Current Outpatient Medications (Other):  Marland Kitchen  Ascorbic Acid (VITAMIN C) 1000 MG tablet, Take 1,000 mg by mouth daily.  .  Calcium Carbonate-Vitamin D (CALCIUM 600+D HIGH POTENCY) 600-400 MG-UNIT tablet, Take 1 tablet by mouth daily. .  Cinnamon 500 MG TABS, Take 1,000 mg by mouth daily.  .  Coenzyme Q10 (COQ10) 200 MG CAPS, Take 200 mg by mouth daily. .  Multiple Vitamins-Minerals (MULTIVITAMIN WITH MINERALS) tablet, Take 1 tablet by mouth daily. Marland Kitchen  omeprazole (PRILOSEC) 20 MG capsule, Take 1 capsule (20 mg total) by mouth 2 (two) times daily. Marland Kitchen  OVER THE COUNTER MEDICATION, Vein Health 1 tablet daily. Vladimir Faster Glycol-Propyl Glycol (SYSTANE) 0.4-0.3 % SOLN, Place 1 drop into both eyes 2 (two) times daily as needed (for dry eyes). .  Probiotic Product (PROBIOTIC PO), Take 1 capsule by mouth daily.   Current Problems (verified) has Essential hypertension; Hyperlipidemia; Hypothyroid; GERD; Asthma; DJD  (degenerative joint disease); Vitamin D deficiency; Osteopenia; Allergy; Anemia; Medication management; Colitis; Encounter for Medicare annual wellness exam; Spondylolisthesis at L4-L5 level; Otalgia of both ears; and Vertigo on their problem list.   Allergies Allergies  Allergen Reactions  . Fosamax [Alendronate Sodium] Other (See Comments)    Reaction:  GI upset   . Singulair [Montelukast Sodium] Other (See Comments)    Reaction:  Makes pt jittery  . Wellbutrin [Bupropion] Hives  . Sulfa Antibiotics Rash   Surgical History: reviewed and unchanged Family History: reviewed and unchanged Social History: reviewed and unchanged    Objective:   Today's Vitals   12/09/19 1435  BP: 120/70  Pulse: 77  Temp: (!) 97.3 F (36.3 C)  SpO2: 96%  Weight: 115 lb (52.2 kg)    General appearance: alert, no distress, WD/WN,  female HEENT: normocephalic,  sclerae anicteric, TMs pearly with effusion bilateral ears, nares patent, no discharge or erythema, pharynx normal, + tender bilateral temporal areas and jaw Oral cavity: MMM, no lesions Neck: supple, no lymphadenopathy, no thyromegaly, no masses Heart: RRR, normal S1, S2, no murmurs Lungs: CTA bilaterally, no wheezes, rhonchi, or rales Abdomen: +bs, soft, + epigastric tenderness, non distended, no masses, no hepatomegaly, no splenomegaly Musculoskeletal: nontender, no swelling, no obvious deformity Extremities: no edema, no cyanosis, no clubbing Pulses: 2+ symmetric, upper and lower extremities, normal cap refill Neurological: alert, oriented x 3, CN2-12 intact, strength normal upper extremities and lower extremities, sensation normal throughout, DTRs 2+ throughout, no cerebellar signs, gait normal, normal finger to nose.  Psychiatric: normal affect, behavior normal, pleasant   Vicie Mutters, PA-C

## 2019-12-10 ENCOUNTER — Other Ambulatory Visit: Payer: Self-pay | Admitting: Physician Assistant

## 2019-12-10 ENCOUNTER — Ambulatory Visit: Payer: Medicare Other | Attending: Internal Medicine

## 2019-12-10 DIAGNOSIS — Z23 Encounter for immunization: Secondary | ICD-10-CM | POA: Insufficient documentation

## 2019-12-10 DIAGNOSIS — R1013 Epigastric pain: Secondary | ICD-10-CM

## 2019-12-10 DIAGNOSIS — R140 Abdominal distension (gaseous): Secondary | ICD-10-CM

## 2019-12-10 LAB — COMPLETE METABOLIC PANEL WITH GFR
AG Ratio: 2.6 (calc) — ABNORMAL HIGH (ref 1.0–2.5)
ALT: 53 U/L — ABNORMAL HIGH (ref 6–29)
AST: 42 U/L — ABNORMAL HIGH (ref 10–35)
Albumin: 4.1 g/dL (ref 3.6–5.1)
Alkaline phosphatase (APISO): 39 U/L (ref 37–153)
BUN: 10 mg/dL (ref 7–25)
CO2: 27 mmol/L (ref 20–32)
Calcium: 9.3 mg/dL (ref 8.6–10.4)
Chloride: 100 mmol/L (ref 98–110)
Creat: 0.81 mg/dL (ref 0.60–0.93)
GFR, Est African American: 81 mL/min/{1.73_m2} (ref >=60)
GFR, Est Non African American: 70 mL/min/{1.73_m2} (ref >=60)
Globulin: 1.6 g/dL (calc) — ABNORMAL LOW (ref 1.9–3.7)
Glucose, Bld: 110 mg/dL — ABNORMAL HIGH (ref 65–99)
Potassium: 3.9 mmol/L (ref 3.5–5.3)
Sodium: 136 mmol/L (ref 135–146)
Total Bilirubin: 0.5 mg/dL (ref 0.2–1.2)
Total Protein: 5.7 g/dL — ABNORMAL LOW (ref 6.1–8.1)

## 2019-12-10 LAB — CBC WITH DIFFERENTIAL/PLATELET
Absolute Monocytes: 632 cells/uL (ref 200–950)
Basophils Absolute: 12 cells/uL (ref 0–200)
Basophils Relative: 0.2 %
Eosinophils Absolute: 12 cells/uL — ABNORMAL LOW (ref 15–500)
Eosinophils Relative: 0.2 %
HCT: 39.3 % (ref 35.0–45.0)
Hemoglobin: 13.5 g/dL (ref 11.7–15.5)
Lymphs Abs: 1085 cells/uL (ref 850–3900)
MCH: 32.8 pg (ref 27.0–33.0)
MCHC: 34.4 g/dL (ref 32.0–36.0)
MCV: 95.4 fL (ref 80.0–100.0)
MPV: 10.3 fL (ref 7.5–12.5)
Monocytes Relative: 10.2 %
Neutro Abs: 4458 cells/uL (ref 1500–7800)
Neutrophils Relative %: 71.9 %
Platelets: 184 10*3/uL (ref 140–400)
RBC: 4.12 10*6/uL (ref 3.80–5.10)
RDW: 13.1 % (ref 11.0–15.0)
Total Lymphocyte: 17.5 %
WBC: 6.2 10*3/uL (ref 3.8–10.8)

## 2019-12-10 LAB — URINALYSIS, ROUTINE W REFLEX MICROSCOPIC
Bilirubin Urine: NEGATIVE
Glucose, UA: NEGATIVE
Hgb urine dipstick: NEGATIVE
Ketones, ur: NEGATIVE
Leukocytes,Ua: NEGATIVE
Nitrite: NEGATIVE
Protein, ur: NEGATIVE
Specific Gravity, Urine: 1.005 (ref 1.001–1.03)
pH: 7.5 (ref 5.0–8.0)

## 2019-12-10 LAB — LIPASE: Lipase: 83 U/L — ABNORMAL HIGH (ref 7–60)

## 2019-12-10 LAB — URINE CULTURE
MICRO NUMBER:: 10062519
Result:: NO GROWTH
SPECIMEN QUALITY:: ADEQUATE

## 2019-12-10 LAB — AMYLASE: Amylase: 49 U/L (ref 21–101)

## 2019-12-10 LAB — SEDIMENTATION RATE: Sed Rate: 6 mm/h (ref 0–30)

## 2019-12-10 LAB — TSH: TSH: 0.69 mIU/L (ref 0.40–4.50)

## 2019-12-10 LAB — C-REACTIVE PROTEIN: CRP: 0.7 mg/L (ref <8.0)

## 2019-12-10 NOTE — Progress Notes (Signed)
   Covid-19 Vaccination Clinic  Name:  Debbie Houston    MRN: [id number] DOB: [birth year]  12/10/2019  [name] was observed post Covid-19 immunization for 15 minutes without incidence. She was provided with Vaccine Information Sheet and instruction to access the V-Safe system.   [name] was instructed to call 911 with any severe reactions post vaccine: Marland Kitchen Difficulty breathing  . Swelling of your face and throat  . A fast heartbeat  . A bad rash all over your body  . Dizziness and weakness    Immunizations Administered    Name Date Dose VIS Date Route   Pfizer COVID-19 Vaccine 12/10/2019 10:19 AM 0.3 mL 10/30/2019 Intramuscular   Manufacturer: [place]   Lot: [id number]   [place]: [id number]

## 2019-12-23 ENCOUNTER — Ambulatory Visit
Admission: RE | Admit: 2019-12-23 | Discharge: 2019-12-23 | Disposition: A | Discharge status: Home / Self Care | Payer: Medicare Other | Source: Ambulatory Visit | Attending: Physician Assistant | Admitting: Physician Assistant

## 2019-12-23 DIAGNOSIS — K760 Fatty (change of) liver, not elsewhere classified: Secondary | ICD-10-CM | POA: Diagnosis not present

## 2019-12-23 DIAGNOSIS — N200 Calculus of kidney: Secondary | ICD-10-CM | POA: Diagnosis not present

## 2019-12-23 DIAGNOSIS — R1013 Epigastric pain: Secondary | ICD-10-CM

## 2019-12-23 DIAGNOSIS — R140 Abdominal distension (gaseous): Secondary | ICD-10-CM

## 2019-12-23 MED ORDER — IOPAMIDOL (ISOVUE-300) INJECTION 61%
100.0000 mL | Freq: Once | INTRAVENOUS | Status: AC | PRN
  Administered 2019-12-23: 100 mL via INTRAVENOUS

## 2019-12-29 DIAGNOSIS — Z1231 Encounter for screening mammogram for malignant neoplasm of breast: Secondary | ICD-10-CM | POA: Diagnosis not present

## 2019-12-29 LAB — HM MAMMOGRAPHY

## 2019-12-31 ENCOUNTER — Ambulatory Visit: Payer: Medicare Other | Attending: Internal Medicine

## 2019-12-31 DIAGNOSIS — Z23 Encounter for immunization: Secondary | ICD-10-CM | POA: Insufficient documentation

## 2019-12-31 NOTE — Progress Notes (Signed)
   Covid-19 Vaccination Clinic  Name:  Debbie Houston    MRN: [id number] DOB: 23-Jan-1941  12/31/2019  [name] was observed post Covid-19 immunization for 15 minutes without incidence. She was provided with Vaccine Information Sheet and instruction to access the V-Safe system.   [name] was instructed to call 911 with any severe reactions post vaccine: Marland Kitchen Difficulty breathing  . Swelling of your face and throat  . A fast heartbeat  . A bad rash all over your body  . Dizziness and weakness    Immunizations Administered    Name Date Dose VIS Date Route   Pfizer COVID-19 Vaccine 12/31/2019 10:47 AM 0.3 mL 10/30/2019 Intramuscular   Manufacturer: [place]   Lot: [id number]   [place]: [id number]

## 2020-01-01 NOTE — Progress Notes (Signed)
Office Visit Note  Patient: Debbie Houston             Date of Birth: Oct 14, 1941           MRN: [id number]             PCP: Unk Pinto, MD Referring: Garnet Sierras, NP Visit Date: 01/06/2020 Occupation: @GUAROCC @  Subjective:  Pain in multiple joints.   History of Present Illness: Debbie Houston is a 79 y.o. female seen in consultation per request of Dr. Alinda Dooms.  According to patient she has had history of joint pain for at least 5 years.  She states all of her joints are painful.  She has noticed some deformity in her fingers.  She also complains of lot of discomfort in her thumbs which she describes over her [facility] joint.  She also describes discomfort in her cervical spine.  She denies any joint swelling.  She was recently seen by her doctor and the labs showed positive ANA for that reason she was referred to me.  She gives history of dry mouth and dry eyes which has been going on recently.  She also has history of degenerative disc disease.  She has had lumbar spine surgery by Dr. Ellene Route.  Activities of Daily Living:  Patient reports morning stiffness for several hours.   Patient Reports nocturnal pain.  Difficulty dressing/grooming: Denies Difficulty climbing stairs: Denies Difficulty getting out of chair: Denies Difficulty using hands for taps, buttons, cutlery, and/or writing: Reports  Review of Systems  Constitutional: Positive for fatigue. Negative for night sweats, weight gain and weight loss.  HENT: Positive for mouth dryness and nose dryness. Negative for mouth sores, trouble swallowing and trouble swallowing.   Eyes: Positive for dryness. Negative for pain, redness and visual disturbance.  Respiratory: Negative for cough, shortness of breath and difficulty breathing.   Cardiovascular: Negative for chest pain, palpitations, hypertension, irregular heartbeat and swelling in legs/feet.  Gastrointestinal: Positive for diarrhea. Negative for blood in stool and  constipation.  Endocrine: Positive for increased urination.  Genitourinary: Positive for nocturia. Negative for difficulty urinating, painful urination and vaginal dryness.  Musculoskeletal: Positive for arthralgias, joint pain and morning stiffness. Negative for joint swelling, myalgias, muscle weakness, muscle tenderness and myalgias.  Skin: Negative for color change, rash, hair loss, skin tightness, ulcers and sensitivity to sunlight.  Allergic/Immunologic: Negative for susceptible to infections.  Neurological: Positive for dizziness. Negative for headaches, memory loss and night sweats.  Hematological: Negative for bruising/bleeding tendency and swollen glands.  Psychiatric/Behavioral: Negative for depressed mood, confusion and sleep disturbance. The patient is not nervous/anxious.     PMFS History:  Patient Active Problem List   Diagnosis Date Noted  . Otalgia of both ears 03/25/2019  . Vertigo 03/25/2019  . Spondylolisthesis at L4-L5 level 11/25/2018  . Encounter for Medicare annual wellness exam 07/07/2015  . Colitis 12/21/2014  . Medication management 04/20/2014  . Essential hypertension   . Hyperlipidemia   . Hypothyroid   . GERD   . Asthma   . DJD (degenerative joint disease)   . Vitamin D deficiency   . Osteopenia   . Allergy   . Anemia     Past Medical History:  Diagnosis Date  . Arthritis   . Benign labile hypertension   . Cancer ([place])    skin cancer on face  . Colitis   . DJD (degenerative joint disease)   . Endometrial polyp   . GERD (gastroesophageal reflux disease)   .  History of basal cell carcinoma excision   . History of esophagitis   . HOH (hard of hearing)    right ear better than left  -- wears no aides  . Hyperlipidemia   . Hypothyroidism   . Osteopenia   . Prediabetes   . Vitamin D deficiency   . Wears glasses     Family History  Problem Relation Age of Onset  . Hypertension Mother   . Cancer Mother        COLON  . Hyperlipidemia  Mother   . Cancer Father        BREAST WITH BRAIN METS  . Hyperlipidemia Father   . Cancer Sister        COLON (TEENS), BREAST (43)  . Hyperlipidemia Sister   . Autism Son   . Colitis Son   . Healthy Son   . Diabetes Brother   . Hyperlipidemia Brother   . Hypertension Brother   . Cancer Maternal Grandmother        RENAL  . Hyperlipidemia Maternal Grandmother   . Diabetes Maternal Grandfather   . Stroke Maternal Grandfather   . Hyperlipidemia Maternal Grandfather   . Hyperlipidemia Paternal Grandmother   . Hyperlipidemia Paternal Grandfather    Past Surgical History:  Procedure Laterality Date  . BACK SURGERY  2020  . DILATATION & CURETTAGE/HYSTEROSCOPY WITH MYOSURE N/A 10/27/2014   Procedure: DILATATION & CURETTAGE/HYSTEROSCOPY WITH MYOSURE;  Surgeon: Darlyn Chamber, MD;  Location: [facility];  Service: Gynecology;  Laterality: N/A;  . DILATION AND CURETTAGE OF UTERUS    . EYE SURGERY     bilateral  . MOHS SURGERY  2005   LEFT NASAL BRIDGE FOR BASAL CELL  . TONSILLECTOMY  age 41   Social History   Social History Narrative  . Not on file   Immunization History  Administered Date(s) Administered  . Influenza, High Dose Seasonal PF 07/23/2014, 08/23/2015, 07/10/2016, 08/27/2017, 08/22/2018, 08/25/2019  . PFIZER SARS-COV-2 Vaccination 12/10/2019, 12/31/2019  . Pneumococcal Conjugate-13 05/05/2015  . Pneumococcal-Unspecified 08/22/2011  . Tdap 08/22/2011  . Zoster 06/20/2006  . Zoster Recombinat (Shingrix) 09/13/2017, 01/01/2018     Objective: Vital Signs: BP 119/73 (BP Location: Right Arm, Patient Position: Sitting, Cuff Size: Normal)   Pulse 75   Resp 14   Ht 5' 4.5" (1.638 m)   Wt 113 lb (51.3 kg)   BMI 19.10 kg/m    Physical Exam Vitals and nursing note reviewed.  Constitutional:      Appearance: She is well-developed.  HENT:     Head: Normocephalic and atraumatic.  Eyes:     Conjunctiva/sclera: Conjunctivae normal.  Cardiovascular:      Rate and Rhythm: Normal rate and regular rhythm.     Heart sounds: Normal heart sounds.  Pulmonary:     Effort: Pulmonary effort is normal.     Breath sounds: Normal breath sounds.  Abdominal:     General: Bowel sounds are normal.     Palpations: Abdomen is soft.  Musculoskeletal:     Cervical back: Normal range of motion.  Lymphadenopathy:     Cervical: No cervical adenopathy.  Skin:    General: Skin is warm and dry.     Capillary Refill: Capillary refill takes less than 2 seconds.  Neurological:     Mental Status: She is alert and oriented to person, place, and time.  Psychiatric:        Behavior: Behavior normal.      Musculoskeletal Exam: C-spine was  a limited range of motion.  She is a surgical scar on her lumbar spine from the lumbar spine fusion in the past.  Shoulder joints and elbow joints with good range of motion.  She has bilateral CMC PIP and DIP thickening with subluxation of some of her DIP joints.  No synovitis was noted.  Hip joints and knee joints were in good range of motion.  Ankle joints were in good range of motion.  She has DIP PIP thickening in her feet and bilateral hallux valgus deformity.  No synovitis was noted.  CDAI Exam: CDAI Score: -- Patient Global: --; Provider Global: -- Swollen: --; Tender: -- Joint Exam 01/06/2020   No joint exam has been documented for this visit   There is currently no information documented on the homunculus. Go to the Rheumatology activity and complete the homunculus joint exam.  Investigation: No additional findings.  Imaging: CT ABDOMEN PELVIS W CONTRAST  Result Date: 12/23/2019 CLINICAL DATA:  Abdominal and pelvic pain. EXAM: CT ABDOMEN AND PELVIS WITH CONTRAST TECHNIQUE: Multidetector CT imaging of the abdomen and pelvis was performed using the standard protocol following bolus administration of intravenous contrast. CONTRAST:  138m ISOVUE-300 IOPAMIDOL (ISOVUE-300) INJECTION 61% COMPARISON:  None. FINDINGS: Lower  chest: No acute abnormality. Hepatobiliary: Diffuse fatty infiltration of the liver parenchyma is seen. 0.6 cm, 0.2 cm and 1.6 cm cystic appearing areas are seen within the liver. No gallstones, gallbladder wall thickening, or biliary dilatation. Pancreas: Unremarkable. No pancreatic ductal dilatation or surrounding inflammatory changes. Spleen: Normal in size without focal abnormality. Adrenals/Urinary Tract: There is mild diffuse bilateral adrenal gland enlargement. Kidneys are normal in size, without focal lesions. A 4 mm nonobstructing renal stone is seen within the posterior aspect of the mid right kidney. An additional 4 mm nonobstructing renal stone is seen within the posterior aspect of the mid to lower left kidney there is mild right-sided hydronephrosis without evidence of renal obstruction. Bladder is unremarkable. Stomach/Bowel: Mild to moderate severity diffuse gastric wall thickening is seen. The appendix is not clearly identified. No evidence of bowel dilatation. Vascular/Lymphatic: Moderate severity aortic atherosclerosis. No enlarged abdominal or pelvic lymph nodes. Reproductive: 1.4 cm and 1.6 cm coarse calcifications are seen within the uterus on the right. Other: No abdominal wall hernia or abnormality. No abdominopelvic ascites. Musculoskeletal: Bilateral pedicle screws are seen at the levels of L4 and L5 vertebral bodies. Prior vertebroplasty of the L3 vertebral body is seen. IMPRESSION: 1. Diffuse gastric wall thickening which may represent sequelae associated with acute gastritis. 2. Fatty liver with small hepatic cysts. 3. Bilateral subcentimeter non-obstructing renal stones. 4. Postoperative changes within the lower lumbar spine. 5. Findings likely consistent with calcified uterine fibroids. Electronically Signed   By: TVirgina NorfolkM.D.   On: 12/23/2019 23:19    Recent Labs: Lab Results  Component Value Date   WBC 6.2 12/09/2019   HGB 13.5 12/09/2019   PLT 184 12/09/2019   NA  136 12/09/2019   K 3.9 12/09/2019   CL 100 12/09/2019   CO2 27 12/09/2019   GLUCOSE 110 (H) 12/09/2019   BUN 10 12/09/2019   CREATININE 0.81 12/09/2019   BILITOT 0.5 12/09/2019   ALKPHOS 51 06/13/2017   AST 42 (H) 12/09/2019   ALT 53 (H) 12/09/2019   PROT 5.7 (L) 12/09/2019   ALBUMIN 4.1 06/13/2017   CALCIUM 9.3 12/09/2019   GFRAA 81 12/09/2019    Speciality Comments: No specialty comments available.  Procedures:  No procedures performed Allergies: Fosamax [alendronate  sodium], Singulair [montelukast sodium], Wellbutrin [bupropion], and Sulfa antibiotics   Assessment / Plan:     Visit Diagnoses: Positive ANA (antinuclear antibody) - 10/22/19: ANA 1:80 NS, CCP<16, RF<14, Vitamin D 51, Vitamin B12 612, TSH 4.42, T4 1.4, T3 62, EBV IgG 188 -patient has positive ANA low titer but no clinical features of autoimmune disease except for sicca symptoms.  I will obtain additional antibodies today.  Plan: Sjogrens syndrome-A extractable nuclear antibody, Sjogrens syndrome-B extractable nuclear antibody, Anti-scleroderma antibody, RNP Antibody, Anti-Smith antibody, Anti-DNA antibody, double-stranded, C3 and C4  Pain in both hands -clinical findings are consistent with osteoarthritis.  She has DIP subluxation.  I offered support ring splint but she declined.  Joint protection muscle strengthening was discussed.  Plan: XR Hand 2 View Right, XR Hand 2 View Left.  X-ray findings were consistent with severe osteoarthritis.  Pain in both feet -she complains of ongoing discomfort in her feet.  No synovitis was noted.  Clinical and radiographic findings are consistent with osteoarthritis.  She would benefit from orthotics.  Plan: XR Foot 2 Views Right, XR Foot 2 Views Left.  X-ray findings were consistent with osteoarthritis.  Neck pain -she complains of discomfort in her cervical spine.  She has limited range of motion of her cervical spine.  Plan: XR Cervical Spine 2 or 3 views.  X-rays were consistent  with degenerative disc disease and facet joint arthropathy.  Spondylolisthesis at L4-L5 level -she had lumbar spine fusion by Dr. Ellene Route and has been doing fairly well.  She states she has some numbness in her bilateral feet.  She has appointment coming up with Dr. Ellene Route.  Malaise and fatigue  Essential hypertension  Vitamin D deficiency  Osteopenia of multiple sites-patient gives history of vertebral fracture in the past.  She states she has been on Prolia injections.  History of gastroesophageal reflux (GERD)  History of asthma  History of hyperlipidemia  History of hypothyroidism    Orders: Orders Placed This Encounter  Procedures  . XR Hand 2 View Right  . XR Hand 2 View Left  . XR Foot 2 Views Right  . XR Foot 2 Views Left  . XR Cervical Spine 2 or 3 views  . Sjogrens syndrome-A extractable nuclear antibody  . Sjogrens syndrome-B extractable nuclear antibody  . Anti-scleroderma antibody  . RNP Antibody  . Anti-Smith antibody  . Anti-DNA antibody, double-stranded  . C3 and C4   No orders of the defined types were placed in this encounter.   Face-to-face time spent with patient was  minutes. Greater than 50% of time was spent in counseling and coordination of care.  Follow-Up Instructions: Return for Osteoarthritis, positive ANA.   Bo Merino, MD  Note - This record has been created using Editor, commissioning.  Chart creation errors have been sought, but may not always  have been located. Such creation errors do not reflect on  the standard of medical care.

## 2020-01-06 ENCOUNTER — Encounter: Payer: Self-pay | Admitting: Rheumatology

## 2020-01-06 ENCOUNTER — Ambulatory Visit: Payer: Self-pay

## 2020-01-06 ENCOUNTER — Other Ambulatory Visit: Payer: Self-pay

## 2020-01-06 ENCOUNTER — Ambulatory Visit (INDEPENDENT_AMBULATORY_CARE_PROVIDER_SITE_OTHER): Payer: Medicare Other | Admitting: Rheumatology

## 2020-01-06 ENCOUNTER — Ambulatory Visit (INDEPENDENT_AMBULATORY_CARE_PROVIDER_SITE_OTHER): Payer: Medicare Other

## 2020-01-06 VITALS — BP 119/73 | HR 75 | Resp 14 | Ht 64.5 in | Wt 113.0 lb

## 2020-01-06 DIAGNOSIS — M4316 Spondylolisthesis, lumbar region: Secondary | ICD-10-CM

## 2020-01-06 DIAGNOSIS — M79671 Pain in right foot: Secondary | ICD-10-CM

## 2020-01-06 DIAGNOSIS — M79641 Pain in right hand: Secondary | ICD-10-CM

## 2020-01-06 DIAGNOSIS — M8589 Other specified disorders of bone density and structure, multiple sites: Secondary | ICD-10-CM | POA: Diagnosis not present

## 2020-01-06 DIAGNOSIS — Z8639 Personal history of other endocrine, nutritional and metabolic disease: Secondary | ICD-10-CM | POA: Diagnosis not present

## 2020-01-06 DIAGNOSIS — M79672 Pain in left foot: Secondary | ICD-10-CM

## 2020-01-06 DIAGNOSIS — R5381 Other malaise: Secondary | ICD-10-CM | POA: Diagnosis not present

## 2020-01-06 DIAGNOSIS — M79642 Pain in left hand: Secondary | ICD-10-CM

## 2020-01-06 DIAGNOSIS — R768 Other specified abnormal immunological findings in serum: Secondary | ICD-10-CM | POA: Diagnosis not present

## 2020-01-06 DIAGNOSIS — M542 Cervicalgia: Secondary | ICD-10-CM

## 2020-01-06 DIAGNOSIS — Z8719 Personal history of other diseases of the digestive system: Secondary | ICD-10-CM

## 2020-01-06 DIAGNOSIS — Z8709 Personal history of other diseases of the respiratory system: Secondary | ICD-10-CM

## 2020-01-06 DIAGNOSIS — E559 Vitamin D deficiency, unspecified: Secondary | ICD-10-CM

## 2020-01-06 DIAGNOSIS — I10 Essential (primary) hypertension: Secondary | ICD-10-CM | POA: Diagnosis not present

## 2020-01-06 DIAGNOSIS — R5383 Other fatigue: Secondary | ICD-10-CM

## 2020-01-08 LAB — C3 AND C4
C3 Complement: 113 mg/dL (ref 83–193)
C4 Complement: 20 mg/dL (ref 15–57)

## 2020-01-08 LAB — ANTI-DNA ANTIBODY, DOUBLE-STRANDED: ds DNA Ab: <1 IU/mL

## 2020-01-08 LAB — SJOGRENS SYNDROME-B EXTRACTABLE NUCLEAR ANTIBODY: SSB (La) (ENA) Antibody, IgG: <1 AI

## 2020-01-08 LAB — RNP ANTIBODY: Ribonucleic Protein(ENA) Antibody, IgG: <1 AI

## 2020-01-08 LAB — SJOGRENS SYNDROME-A EXTRACTABLE NUCLEAR ANTIBODY: SSA (Ro) (ENA) Antibody, IgG: <1 AI

## 2020-01-08 LAB — ANTI-SMITH ANTIBODY: ENA SM Ab Ser-aCnc: <1 AI

## 2020-01-08 LAB — ANTI-SCLERODERMA ANTIBODY: Scleroderma (Scl-70) (ENA) Antibody, IgG: <1 AI

## 2020-01-08 NOTE — Progress Notes (Signed)
All the labs are within normal limits.  I will discuss results at the follow-up visit.

## 2020-01-11 ENCOUNTER — Other Ambulatory Visit: Payer: Self-pay | Admitting: Physician Assistant

## 2020-01-12 ENCOUNTER — Other Ambulatory Visit: Payer: Self-pay | Admitting: Internal Medicine

## 2020-01-12 ENCOUNTER — Other Ambulatory Visit: Payer: Self-pay | Admitting: *Deleted

## 2020-01-12 DIAGNOSIS — K2100 Gastro-esophageal reflux disease with esophagitis, without bleeding: Secondary | ICD-10-CM

## 2020-01-12 DIAGNOSIS — E538 Deficiency of other specified B group vitamins: Secondary | ICD-10-CM

## 2020-01-12 DIAGNOSIS — I8311 Varicose veins of right lower extremity with inflammation: Secondary | ICD-10-CM | POA: Diagnosis not present

## 2020-01-12 DIAGNOSIS — I8312 Varicose veins of left lower extremity with inflammation: Secondary | ICD-10-CM | POA: Diagnosis not present

## 2020-01-12 MED ORDER — PANTOPRAZOLE SODIUM 40 MG PO TBEC: DELAYED_RELEASE_TABLET | ORAL | 3 refills | Status: DC

## 2020-01-13 ENCOUNTER — Other Ambulatory Visit: Payer: Self-pay | Admitting: Physician Assistant

## 2020-01-15 DIAGNOSIS — I8312 Varicose veins of left lower extremity with inflammation: Secondary | ICD-10-CM | POA: Diagnosis not present

## 2020-01-15 DIAGNOSIS — I8311 Varicose veins of right lower extremity with inflammation: Secondary | ICD-10-CM | POA: Diagnosis not present

## 2020-01-20 ENCOUNTER — Other Ambulatory Visit (HOSPITAL_COMMUNITY): Payer: Self-pay | Admitting: Neurological Surgery

## 2020-01-20 ENCOUNTER — Other Ambulatory Visit: Payer: Self-pay | Admitting: Neurological Surgery

## 2020-01-20 DIAGNOSIS — M47812 Spondylosis without myelopathy or radiculopathy, cervical region: Secondary | ICD-10-CM

## 2020-01-20 DIAGNOSIS — M5416 Radiculopathy, lumbar region: Secondary | ICD-10-CM

## 2020-01-21 ENCOUNTER — Other Ambulatory Visit: Payer: Self-pay | Admitting: Neurological Surgery

## 2020-01-28 DIAGNOSIS — M47812 Spondylosis without myelopathy or radiculopathy, cervical region: Secondary | ICD-10-CM | POA: Diagnosis not present

## 2020-01-28 DIAGNOSIS — M810 Age-related osteoporosis without current pathological fracture: Secondary | ICD-10-CM | POA: Diagnosis not present

## 2020-02-01 NOTE — Progress Notes (Signed)
Office Visit Note  Patient: Debbie Houston             Date of Birth: 09/20/1941           MRN: [id number]             PCP: Unk Pinto, MD Referring: Unk Pinto, MD Visit Date: 02/09/2020 Occupation: @GUAROCC @  Subjective:  Neck and lower back pain.   History of Present Illness: Debbie Houston is a 79 y.o. female with history of osteoarthritis and degenerative disc disease.  She states she continues to have a lot of lower back discomfort with left-sided radiculopathy.  She is scheduled to have a CT myelogram.  She has been also having paresthesias in her feet.  She has some neck discomfort also.  She has been followed by Dr. Ellene Route.  She continues to have some stiffness and discomfort in her hands.  Activities of Daily Living:  Patient reports morning stiffness for24 hours.   Patient Reports nocturnal pain.  Difficulty dressing/grooming: Denies Difficulty climbing stairs: Denies Difficulty getting out of chair: Denies Difficulty using hands for taps, buttons, cutlery, and/or writing: Denies  Review of Systems  Constitutional: Positive for fatigue. Negative for night sweats, weight gain and weight loss.  HENT: Positive for mouth dryness. Negative for mouth sores, trouble swallowing, trouble swallowing and nose dryness.   Eyes: Positive for dryness. Negative for pain, redness and visual disturbance.  Respiratory: Negative for cough, shortness of breath and difficulty breathing.   Cardiovascular: Negative for chest pain, palpitations, hypertension, irregular heartbeat and swelling in legs/feet.  Gastrointestinal: Negative for blood in stool, constipation and diarrhea.  Endocrine: Negative for increased urination.  Genitourinary: Negative for vaginal dryness.  Musculoskeletal: Positive for arthralgias, joint pain and morning stiffness. Negative for joint swelling, myalgias, muscle weakness, muscle tenderness and myalgias.  Skin: Negative for color change, rash, hair loss,  skin tightness, ulcers and sensitivity to sunlight.  Allergic/Immunologic: Negative for susceptible to infections.  Neurological: Negative for dizziness, memory loss, night sweats and weakness.  Hematological: Negative for swollen glands.  Psychiatric/Behavioral: Positive for sleep disturbance. Negative for depressed mood. The patient is nervous/anxious.     PMFS History:  Patient Active Problem List   Diagnosis Date Noted  . Otalgia of both ears 03/25/2019  . Vertigo 03/25/2019  . Spondylolisthesis at L4-L5 level 11/25/2018  . Encounter for Medicare annual wellness exam 07/07/2015  . Colitis 12/21/2014  . Medication management 04/20/2014  . Essential hypertension   . Hyperlipidemia   . Hypothyroid   . GERD   . Asthma   . DJD (degenerative joint disease)   . Vitamin D deficiency   . Osteopenia   . Allergy   . Anemia     Past Medical History:  Diagnosis Date  . Arthritis   . Benign labile hypertension   . Cancer ([place])    skin cancer on face  . Colitis   . DJD (degenerative joint disease)   . Endometrial polyp   . GERD (gastroesophageal reflux disease)   . History of basal cell carcinoma excision   . History of esophagitis   . HOH (hard of hearing)    right ear better than left  -- wears no aides  . Hyperlipidemia   . Hypothyroidism   . Osteopenia   . Prediabetes   . Vitamin D deficiency   . Wears glasses     Family History  Problem Relation Age of Onset  . Hypertension Mother   . Cancer Mother  COLON  . Hyperlipidemia Mother   . Cancer Father        BREAST WITH BRAIN METS  . Hyperlipidemia Father   . Cancer Sister        COLON (TEENS), BREAST (63)  . Hyperlipidemia Sister   . Autism Son   . Colitis Son   . Healthy Son   . Diabetes Brother   . Hyperlipidemia Brother   . Hypertension Brother   . Cancer Maternal Grandmother        RENAL  . Hyperlipidemia Maternal Grandmother   . Diabetes Maternal Grandfather   . Stroke Maternal Grandfather   .  Hyperlipidemia Maternal Grandfather   . Hyperlipidemia Paternal Grandmother   . Hyperlipidemia Paternal Grandfather    Past Surgical History:  Procedure Laterality Date  . BACK SURGERY  2020  . DILATATION & CURETTAGE/HYSTEROSCOPY WITH MYOSURE N/A 10/27/2014   Procedure: DILATATION & CURETTAGE/HYSTEROSCOPY WITH MYOSURE;  Surgeon: Darlyn Chamber, MD;  Location: [facility];  Service: Gynecology;  Laterality: N/A;  . DILATION AND CURETTAGE OF UTERUS    . EYE SURGERY     bilateral  . MOHS SURGERY  2005   LEFT NASAL BRIDGE FOR BASAL CELL  . TONSILLECTOMY  age 51   Social History   Social History Narrative  . Not on file   Immunization History  Administered Date(s) Administered  . Influenza, High Dose Seasonal PF 07/23/2014, 08/23/2015, 07/10/2016, 08/27/2017, 08/22/2018, 08/25/2019  . PFIZER SARS-COV-2 Vaccination 12/10/2019, 12/31/2019  . Pneumococcal Conjugate-13 05/05/2015  . Pneumococcal-Unspecified 08/22/2011  . Tdap 08/22/2011  . Zoster 06/20/2006  . Zoster Recombinat (Shingrix) 09/13/2017, 01/01/2018     Objective: Vital Signs: BP 124/80 (BP Location: Left Arm, Patient Position: Sitting, Cuff Size: Small)   Pulse 80   Resp 12   Ht 5' 4.5" (1.638 m)   Wt 114 lb (51.7 kg)   BMI 19.27 kg/m    Physical Exam Vitals and nursing note reviewed.  Constitutional:      Appearance: She is well-developed.  HENT:     Head: Normocephalic and atraumatic.  Eyes:     Conjunctiva/sclera: Conjunctivae normal.  Cardiovascular:     Rate and Rhythm: Normal rate and regular rhythm.     Heart sounds: Normal heart sounds.  Pulmonary:     Effort: Pulmonary effort is normal.     Breath sounds: Normal breath sounds.  Abdominal:     General: Bowel sounds are normal.     Palpations: Abdomen is soft.  Musculoskeletal:     Cervical back: Normal range of motion.  Lymphadenopathy:     Cervical: No cervical adenopathy.  Skin:    General: Skin is warm and dry.     Capillary  Refill: Capillary refill takes less than 2 seconds.  Neurological:     Mental Status: She is alert and oriented to person, place, and time.  Psychiatric:        Behavior: Behavior normal.      Musculoskeletal Exam: Patient has limited range of motion of the cervical and lumbar spine.  Shoulder joints, elbow joints, wrist joints were in good range of motion.  She has bilateral CMC arthritis and subluxation.  She has severe DIP arthritis and subluxation of her right first DIP joint.  Hip joints, knee joints, ankles, MTPs and PIPs been good range of motion with no synovitis.  She does have osteoarthritic changes in her PIPs and DIPs.  CDAI Exam: CDAI Score: -- Patient Global: --; Provider Global: -- Swollen: --;  Tender: -- Joint Exam 02/09/2020   No joint exam has been documented for this visit   There is currently no information documented on the homunculus. Go to the Rheumatology activity and complete the homunculus joint exam.  Investigation: No additional findings.  Imaging: No results found.  Recent Labs: Lab Results  Component Value Date   WBC 6.0 02/03/2020   HGB 13.4 02/03/2020   PLT 214 02/03/2020   NA 137 02/03/2020   K 4.2 02/03/2020   CL 102 02/03/2020   CO2 24 02/03/2020   GLUCOSE 102 (H) 02/03/2020   BUN 11 02/03/2020   CREATININE 0.72 02/03/2020   BILITOT 0.4 02/03/2020   ALKPHOS 51 06/13/2017   AST 18 02/03/2020   ALT 17 02/03/2020   PROT 5.8 (L) 02/03/2020   ALBUMIN 4.1 06/13/2017   CALCIUM 9.3 02/03/2020   GFRAA 93 02/03/2020    Speciality Comments: No specialty comments available.  Procedures:  No procedures performed Allergies: Fosamax [alendronate sodium], Singulair [montelukast sodium], Wellbutrin [bupropion], and Sulfa antibiotics   Assessment / Plan:     Visit Diagnoses: Positive ANA (antinuclear antibody) - 10/22/19: ANA 1:80 NS, 01/06/20: ENA negative.  Detailed discussion about the lab work with the patient.  No clinical features of  autoimmune disease.  I have advised patient to contact me in case she develops any new symptoms.  Primary osteoarthritis of both hands - Severe. Declined ring splint.  I have given her a handout on hand exercises.  Joint protection muscle strengthening was discussed.  Primary osteoarthritis of both feet-proper fitting shoes were emphasized.  DDD (degenerative disc disease), cervical - X-rays were consistent with degenerative disc disease and facet joint arthropathy.  She has chronic pain.  Spondylolisthesis at L4-L5 level - lumbar spine fusion by Dr. Ellene Route.  She continues to have left-sided radiculopathy.  She is a scheduled to have have a CT myelogram to evaluate this further.  Osteopenia of multiple sites - Hx of vertebral fractures. She has been taking Prolia injections.  Vitamin D deficiency  History of gastroesophageal reflux (GERD)  History of hyperlipidemia  History of hypothyroidism  History of asthma  Essential hypertension  Orders: No orders of the defined types were placed in this encounter.  No orders of the defined types were placed in this encounter.     Follow-Up Instructions: No follow-ups on file.   Bo Merino, MD  Note - This record has been created using Editor, commissioning.  Chart creation errors have been sought, but may not always  have been located. Such creation errors do not reflect on  the standard of medical care.

## 2020-02-03 ENCOUNTER — Encounter: Payer: Self-pay | Admitting: Internal Medicine

## 2020-02-03 ENCOUNTER — Other Ambulatory Visit: Payer: Self-pay

## 2020-02-03 ENCOUNTER — Ambulatory Visit (INDEPENDENT_AMBULATORY_CARE_PROVIDER_SITE_OTHER): Payer: Medicare Other | Admitting: Internal Medicine

## 2020-02-03 VITALS — BP 110/82 | HR 72 | Temp 97.0°F | Resp 16 | Ht 64.5 in | Wt 113.2 lb

## 2020-02-03 DIAGNOSIS — Z79899 Other long term (current) drug therapy: Secondary | ICD-10-CM | POA: Diagnosis not present

## 2020-02-03 DIAGNOSIS — I10 Essential (primary) hypertension: Secondary | ICD-10-CM | POA: Diagnosis not present

## 2020-02-03 DIAGNOSIS — M1990 Unspecified osteoarthritis, unspecified site: Secondary | ICD-10-CM | POA: Diagnosis not present

## 2020-02-03 DIAGNOSIS — E782 Mixed hyperlipidemia: Secondary | ICD-10-CM | POA: Diagnosis not present

## 2020-02-03 DIAGNOSIS — K219 Gastro-esophageal reflux disease without esophagitis: Secondary | ICD-10-CM

## 2020-02-03 DIAGNOSIS — E559 Vitamin D deficiency, unspecified: Secondary | ICD-10-CM

## 2020-02-03 DIAGNOSIS — R7309 Other abnormal glucose: Secondary | ICD-10-CM

## 2020-02-03 NOTE — Progress Notes (Signed)
History of Present Illness:       This very nice 79 y.o. MWF  presents for 3 month follow up with HTN, HLD, Pre-Diabetes and Vitamin D Deficiency.  Patient has controlled GERD & is followed by Debbie Houston for Collagenous Colitis ( which is controlled on therapy.      Patient is treated for HTN circa 2015  & BP has been controlled at home. Today's BP is at goal- 110/82. Patient has had no complaints of any cardiac type chest pain, palpitations, dyspnea / orthopnea / PND, dizziness, claudication, or dependent edema.      Hyperlipidemia is not controlled with diet & meds. Patient denies myalgias or other med SE's. Last Lipids were not at goal:  Lab Results  Component Value Date   CHOL 235 (H) 11/05/2019   HDL 78 11/05/2019   LDLCALC 136 (H) 11/05/2019   TRIG 106 11/05/2019   CHOLHDL 3.0 11/05/2019    Also, the patient has history of PreDiabetes (A1c5.7% / May 2019)  and has had no symptoms of reactive hypoglycemia, diabetic polys, paresthesias or visual blurring.  Last A1c was   Lab Results  Component Value Date   HGBA1C 5.5 08/03/2019            Patient was discovered Hypothyroid in 1990 and started on Thyroid Replacement.         Further, the patient also has history of Vitamin D Deficiency and supplements hhhhh vitamin D without any suspected side-effects. Last vitamin D was low:  Lab Results  Component Value Date   VD25OH 40 11/05/2019    Current Outpatient Medications on File Prior to Visit  Medication Sig  . acetaminophen (TYLENOL) 500 MG tablet Take 500 mg by mouth every 6 (six) hours as needed.  . Ascorbic Acid (VITAMIN C) 1000 MG tablet Take 1,000 mg by mouth daily.   Marland Kitchen aspirin EC 81 MG tablet Take 81 mg by mouth daily.   . bisoprolol-hydrochlorothiazide (ZIAC) 2.5-6.25 MG tablet Take 1 tablet Daily for BP  . budesonide (ENTOCORT EC) 3 MG 24 hr capsule Take 3 mg by mouth in the morning and at bedtime.   . Calcium Carbonate-Vitamin D (CALCIUM 600+D HIGH POTENCY)  600-400 MG-UNIT tablet Take 1 tablet by mouth daily.  . Cinnamon 500 MG TABS Take 1,000 mg by mouth daily.   . Coenzyme Q10 (COQ10) 200 MG CAPS Take 200 mg by mouth daily.  Marland Kitchen levothyroxine (SYNTHROID) 125 MCG tablet Take 1 tablet daily on an empty stomach with only water for 30 minutes & no Antacid meds, Calcium or Magnesium for 4 hours & avoid Biotin  . Multiple Vitamins-Minerals (MULTIVITAMIN WITH MINERALS) tablet Take 1 tablet by mouth daily.  Marland Kitchen OVER THE COUNTER MEDICATION Vein Health 1 tablet daily.  . pantoprazole (PROTONIX) 40 MG tablet Take 1 tablet Daily for Heartburn & Indigestion (Patient taking differently: 40 mg 2 (two) times daily. Take 1 tablet Daily for Heartburn & Indigestion)  . Polyethyl Glycol-Propyl Glycol (SYSTANE) 0.4-0.3 % SOLN Place 1 drop into both eyes 2 (two) times daily as needed (for dry eyes).  . Probiotic Product (PROBIOTIC PO) Take 1 capsule by mouth daily.  . simvastatin (ZOCOR) 20 MG tablet TAKE 1 TABLET BY MOUTH DAILY   No current facility-administered medications on file prior to visit.    Allergies  Allergen Reactions  . Fosamax [Alendronate Sodium] Other (See Comments)    Reaction:  GI upset   . Singulair [Montelukast Sodium] Other (See  Comments)    Reaction:  Makes pt jittery  . Wellbutrin [Bupropion] Hives  . Sulfa Antibiotics Rash    PMHx:   Past Medical History:  Diagnosis Date  . Arthritis   . Benign labile hypertension   . Cancer ([place])    skin cancer on face  . Colitis   . DJD (degenerative joint disease)   . Endometrial polyp   . GERD (gastroesophageal reflux disease)   . History of basal cell carcinoma excision   . History of esophagitis   . HOH (hard of hearing)    right ear better than left  -- wears no aides  . Hyperlipidemia   . Hypothyroidism   . Osteopenia   . Prediabetes   . Vitamin D deficiency   . Wears glasses     Immunization History  Administered Date(s) Administered  . Influenza, High Dose Seasonal PF  07/23/2014, 08/23/2015, 07/10/2016, 08/27/2017, 08/22/2018, 08/25/2019  . PFIZER SARS-COV-2 Vaccination 12/10/2019, 12/31/2019  . Pneumococcal Conjugate-13 05/05/2015  . Pneumococcal-Unspecified 08/22/2011  . Tdap 08/22/2011  . Zoster 06/20/2006  . Zoster Recombinat (Shingrix) 09/13/2017, 01/01/2018    Past Surgical History:  Procedure Laterality Date  . BACK SURGERY  2020  . DILATATION & CURETTAGE/HYSTEROSCOPY WITH MYOSURE N/A 10/27/2014   Procedure: DILATATION & CURETTAGE/HYSTEROSCOPY WITH MYOSURE;  Surgeon: Debbie Chamber, MD;  Location: [facility];  Service: Gynecology;  Laterality: N/A;  . DILATION AND CURETTAGE OF UTERUS    . EYE SURGERY     bilateral  . MOHS SURGERY  2005   LEFT NASAL BRIDGE FOR BASAL CELL  . TONSILLECTOMY  age 56    FHx:    Reviewed / unchanged  SHx:    Reviewed / unchanged   Systems Review:  Constitutional: Denies fever, chills, wt changes, headaches, insomnia, fatigue, night sweats, change in appetite. Eyes: Denies redness, blurred vision, diplopia, discharge, itchy, watery eyes.  ENT: Denies discharge, congestion, post nasal drip, epistaxis, sore throat, earache, hearing loss, dental pain, tinnitus, vertigo, sinus pain, snoring.  CV: Denies chest pain, palpitations, irregular heartbeat, syncope, dyspnea, diaphoresis, orthopnea, PND, claudication or edema. Respiratory: denies cough, dyspnea, DOE, pleurisy, hoarseness, laryngitis, wheezing.  Gastrointestinal: Denies dysphagia, odynophagia, heartburn, reflux, water brash, abdominal pain or cramps, nausea, vomiting, bloating, diarrhea, constipation, hematemesis, melena, hematochezia  or hemorrhoids. Genitourinary: Denies dysuria, frequency, urgency, nocturia, hesitancy, discharge, hematuria or flank pain. Musculoskeletal: Denies arthralgias, myalgias, stiffness, jt. swelling, pain, limping or strain/sprain.  Skin: Denies pruritus, rash, hives, warts, acne, eczema or change in skin  lesion(s). Neuro: No weakness, tremor, incoordination, spasms, paresthesia or pain. Psychiatric: Denies confusion, memory loss or sensory loss. Endo: Denies change in weight, skin or hair change.  Heme/Lymph: No excessive bleeding, bruising or enlarged lymph nodes.  Physical Exam  BP 110/82   Pulse 72   Temp (!) 97 F (36.1 C)   Resp 16   Ht 5' 4.5" (1.638 m)   Wt 113 lb 3.2 oz (51.3 kg)   BMI 19.13 kg/m   Appears  well nourished, well groomed  and in no distress.  Eyes: PERRLA, EOMs, conjunctiva no swelling or erythema. Sinuses: No frontal/maxillary tenderness ENT/Mouth: EAC's clear, TM's nl w/o erythema, bulging. Nares clear w/o erythema, swelling, exudates. Oropharynx clear without erythema or exudates. Oral hygiene is good. Tongue normal, non obstructing. Hearing intact.  Neck: Supple. Thyroid not palpable. Car 2+/2+ without bruits, nodes or JVD. Chest: Respirations nl with BS clear & equal w/o rales, rhonchi, wheezing or stridor.  Cor: Heart sounds  normal w/ regular rate and rhythm without sig. murmurs, gallops, clicks or rubs. Peripheral pulses normal and equal  without edema.  Abdomen: Soft & bowel sounds normal. Non-tender w/o guarding, rebound, hernias, masses or organomegaly.  Lymphatics: Unremarkable.  Musculoskeletal: Full ROM all peripheral extremities, joint stability, 5/5 strength and normal gait.  Skin: Warm, dry without exposed rashes, lesions or ecchymosis apparent.  Neuro: Cranial nerves intact, reflexes equal bilaterally. Sensory-motor testing grossly intact. Tendon reflexes grossly intact.  Pysch: Alert & oriented x 3.  Insight and judgement nl & appropriate. No ideations.  Assessment and Plan:  1. Essential hypertension  - Continue medication, monitor blood pressure at home.  - Continue DASH diet.  Reminder to go to the ER if any CP,  SOB, nausea, dizziness, severe HA, changes vision/speech.  - CBC with Differential/Platelet - COMPLETE METABOLIC PANEL  WITH GFR - Magnesium - TSH  2. Hyperlipidemia, mixed  - Continue diet/meds, exercise,& lifestyle modifications.  - Continue monitor periodic cholesterol/liver & renal functions   - Lipid panel - TSH  3. Abnormal glucose  - Continue diet, exercise  - Lifestyle modifications.  - Monitor appropriate labs.  - Hemoglobin A1c - Insulin, random  4. Vitamin D deficiency  - Continue supplementation.   - VITAMIN D 25 Hydroxy  5. Gastroesophageal reflux disease  - CBC with Differential/Platelet  6. Osteoarthritis,  7. Medication management  - CBC with Differential/Platelet - COMPLETE METABOLIC PANEL WITH GFR - Magnesium - Lipid panel - TSH - Hemoglobin A1c - Insulin, random - VITAMIN D 25 Hydroxy        Discussed  regular exercise, BP monitoring, weight control to achieve/maintain BMI less than 25 and discussed med and SE's. Recommended labs to assess and monitor clinical status with further disposition pending results of labs.  I discussed the assessment and treatment plan with the patient. The patient was provided an opportunity to ask questions and all were answered. The patient agreed with the plan and demonstrated an understanding of the instructions.  I provided over 30 minutes of exam, counseling, chart review and  complex critical decision making.   Kirtland Bouchard, MD

## 2020-02-03 NOTE — Patient Instructions (Signed)

## 2020-02-04 LAB — CBC WITH DIFFERENTIAL/PLATELET
Absolute Monocytes: 432 cells/uL (ref 200–950)
Basophils Absolute: 30 cells/uL (ref 0–200)
Basophils Relative: 0.5 %
Eosinophils Absolute: 48 cells/uL (ref 15–500)
Eosinophils Relative: 0.8 %
HCT: 40.1 % (ref 35.0–45.0)
Hemoglobin: 13.4 g/dL (ref 11.7–15.5)
Lymphs Abs: 1038 cells/uL (ref 850–3900)
MCH: 32.1 pg (ref 27.0–33.0)
MCHC: 33.4 g/dL (ref 32.0–36.0)
MCV: 95.9 fL (ref 80.0–100.0)
MPV: 10.8 fL (ref 7.5–12.5)
Monocytes Relative: 7.2 %
Neutro Abs: 4452 cells/uL (ref 1500–7800)
Neutrophils Relative %: 74.2 %
Platelets: 214 10*3/uL (ref 140–400)
RBC: 4.18 10*6/uL (ref 3.80–5.10)
RDW: 12.6 % (ref 11.0–15.0)
Total Lymphocyte: 17.3 %
WBC: 6 10*3/uL (ref 3.8–10.8)

## 2020-02-04 LAB — COMPLETE METABOLIC PANEL WITH GFR
AG Ratio: 2.6 (calc) — ABNORMAL HIGH (ref 1.0–2.5)
ALT: 17 U/L (ref 6–29)
AST: 18 U/L (ref 10–35)
Albumin: 4.2 g/dL (ref 3.6–5.1)
Alkaline phosphatase (APISO): 36 U/L — ABNORMAL LOW (ref 37–153)
BUN: 11 mg/dL (ref 7–25)
CO2: 24 mmol/L (ref 20–32)
Calcium: 9.3 mg/dL (ref 8.6–10.4)
Chloride: 102 mmol/L (ref 98–110)
Creat: 0.72 mg/dL (ref 0.60–0.93)
GFR, Est African American: 93 mL/min/{1.73_m2} (ref >=60)
GFR, Est Non African American: 80 mL/min/{1.73_m2} (ref >=60)
Globulin: 1.6 g/dL (calc) — ABNORMAL LOW (ref 1.9–3.7)
Glucose, Bld: 102 mg/dL — ABNORMAL HIGH (ref 65–99)
Potassium: 4.2 mmol/L (ref 3.5–5.3)
Sodium: 137 mmol/L (ref 135–146)
Total Bilirubin: 0.4 mg/dL (ref 0.2–1.2)
Total Protein: 5.8 g/dL — ABNORMAL LOW (ref 6.1–8.1)

## 2020-02-04 LAB — VITAMIN D 25 HYDROXY (VIT D DEFICIENCY, FRACTURES): Vit D, 25-Hydroxy: 56 ng/mL (ref 30–100)

## 2020-02-04 LAB — LIPID PANEL
Cholesterol: 178 mg/dL (ref <200)
HDL: 60 mg/dL (ref >=50)
LDL Cholesterol (Calc): 98 mg/dL (calc)
Non-HDL Cholesterol (Calc): 118 mg/dL (calc) (ref <130)
Total CHOL/HDL Ratio: 3 (calc) (ref <5.0)
Triglycerides: 107 mg/dL (ref <150)

## 2020-02-04 LAB — HEMOGLOBIN A1C
Hgb A1c MFr Bld: 5.4 % of total Hgb (ref <5.7)
Mean Plasma Glucose: 108 (calc)
eAG (mmol/L): 6 (calc)

## 2020-02-04 LAB — INSULIN, RANDOM: Insulin: 34.7 u[IU]/mL — ABNORMAL HIGH

## 2020-02-04 LAB — TSH: TSH: 0.07 mIU/L — ABNORMAL LOW (ref 0.40–4.50)

## 2020-02-04 LAB — MAGNESIUM: Magnesium: 1.7 mg/dL (ref 1.5–2.5)

## 2020-02-09 ENCOUNTER — Ambulatory Visit (INDEPENDENT_AMBULATORY_CARE_PROVIDER_SITE_OTHER): Payer: Medicare Other | Admitting: Rheumatology

## 2020-02-09 ENCOUNTER — Encounter: Payer: Self-pay | Admitting: Rheumatology

## 2020-02-09 ENCOUNTER — Other Ambulatory Visit: Payer: Self-pay

## 2020-02-09 VITALS — BP 124/80 | HR 80 | Resp 12 | Ht 64.5 in | Wt 114.0 lb

## 2020-02-09 DIAGNOSIS — Z8709 Personal history of other diseases of the respiratory system: Secondary | ICD-10-CM

## 2020-02-09 DIAGNOSIS — I10 Essential (primary) hypertension: Secondary | ICD-10-CM

## 2020-02-09 DIAGNOSIS — M5030 Other cervical disc degeneration, unspecified cervical region: Secondary | ICD-10-CM

## 2020-02-09 DIAGNOSIS — Z8719 Personal history of other diseases of the digestive system: Secondary | ICD-10-CM | POA: Diagnosis not present

## 2020-02-09 DIAGNOSIS — M19042 Primary osteoarthritis, left hand: Secondary | ICD-10-CM

## 2020-02-09 DIAGNOSIS — R768 Other specified abnormal immunological findings in serum: Secondary | ICD-10-CM

## 2020-02-09 DIAGNOSIS — M4316 Spondylolisthesis, lumbar region: Secondary | ICD-10-CM | POA: Diagnosis not present

## 2020-02-09 DIAGNOSIS — M19072 Primary osteoarthritis, left ankle and foot: Secondary | ICD-10-CM

## 2020-02-09 DIAGNOSIS — M19071 Primary osteoarthritis, right ankle and foot: Secondary | ICD-10-CM | POA: Diagnosis not present

## 2020-02-09 DIAGNOSIS — E559 Vitamin D deficiency, unspecified: Secondary | ICD-10-CM | POA: Diagnosis not present

## 2020-02-09 DIAGNOSIS — M8589 Other specified disorders of bone density and structure, multiple sites: Secondary | ICD-10-CM

## 2020-02-09 DIAGNOSIS — M19041 Primary osteoarthritis, right hand: Secondary | ICD-10-CM

## 2020-02-09 DIAGNOSIS — Z8639 Personal history of other endocrine, nutritional and metabolic disease: Secondary | ICD-10-CM

## 2020-02-09 NOTE — Patient Instructions (Signed)
Hand Exercises Hand exercises can be helpful for almost anyone. These exercises can strengthen the hands, improve flexibility and movement, and increase blood flow to the hands. These results can make work and daily tasks easier. Hand exercises can be especially helpful for people who have joint pain from arthritis or have nerve damage from overuse (carpal tunnel syndrome). These exercises can also help people who have injured a hand. Exercises Most of these hand exercises are gentle stretching and motion exercises. It is usually safe to do them often throughout the day. Warming up your hands before exercise may help to reduce stiffness. You can do this with gentle massage or by placing your hands in warm water for 10-15 minutes. It is normal to feel some stretching, pulling, tightness, or mild discomfort as you begin new exercises. This will gradually improve. Stop an exercise right away if you feel sudden, severe pain or your pain gets worse. Ask your health care provider which exercises are best for you. Knuckle bend or "claw" fist 1. Stand or sit with your arm, hand, and all five fingers pointed straight up. Make sure to keep your wrist straight during the exercise. 2. Gently bend your fingers down toward your palm until the tips of your fingers are touching the top of your palm. Keep your big knuckle straight and just bend the small knuckles in your fingers. 3. Hold this position for __________ seconds. 4. Straighten (extend) your fingers back to the starting position. Repeat this exercise 5-10 times with each hand. Full finger fist 1. Stand or sit with your arm, hand, and all five fingers pointed straight up. Make sure to keep your wrist straight during the exercise. 2. Gently bend your fingers into your palm until the tips of your fingers are touching the middle of your palm. 3. Hold this position for __________ seconds. 4. Extend your fingers back to the starting position, stretching every  joint fully. Repeat this exercise 5-10 times with each hand. Straight fist 1. Stand or sit with your arm, hand, and all five fingers pointed straight up. Make sure to keep your wrist straight during the exercise. 2. Gently bend your fingers at the big knuckle, where your fingers meet your hand, and the middle knuckle. Keep the knuckle at the tips of your fingers straight and try to touch the bottom of your palm. 3. Hold this position for __________ seconds. 4. Extend your fingers back to the starting position, stretching every joint fully. Repeat this exercise 5-10 times with each hand. Tabletop 1. Stand or sit with your arm, hand, and all five fingers pointed straight up. Make sure to keep your wrist straight during the exercise. 2. Gently bend your fingers at the big knuckle, where your fingers meet your hand, as far down as you can while keeping the small knuckles in your fingers straight. Think of forming a tabletop with your fingers. 3. Hold this position for __________ seconds. 4. Extend your fingers back to the starting position, stretching every joint fully. Repeat this exercise 5-10 times with each hand. Finger spread 1. Place your hand flat on a table with your palm facing down. Make sure your wrist stays straight as you do this exercise. 2. Spread your fingers and thumb apart from each other as far as you can until you feel a gentle stretch. Hold this position for __________ seconds. 3. Bring your fingers and thumb tight together again. Hold this position for __________ seconds. Repeat this exercise 5-10 times with each hand.  Making circles 1. Stand or sit with your arm, hand, and all five fingers pointed straight up. Make sure to keep your wrist straight during the exercise. 2. Make a circle by touching the tip of your thumb to the tip of your index finger. 3. Hold for __________ seconds. Then open your hand wide. 4. Repeat this motion with your thumb and each finger on your hand.  Repeat this exercise 5-10 times with each hand. Thumb motion 1. Sit with your forearm resting on a table and your wrist straight. Your thumb should be facing up toward the ceiling. Keep your fingers relaxed as you move your thumb. 2. Lift your thumb up as high as you can toward the ceiling. Hold for __________ seconds. 3. Bend your thumb across your palm as far as you can, reaching the tip of your thumb for the small finger (pinkie) side of your palm. Hold for __________ seconds. Repeat this exercise 5-10 times with each hand. Grip strengthening  1. Hold a stress ball or other soft ball in the middle of your hand. 2. Slowly increase the pressure, squeezing the ball as much as you can without causing pain. Think of bringing the tips of your fingers into the middle of your palm. All of your finger joints should bend when doing this exercise. 3. Hold your squeeze for __________ seconds, then relax. Repeat this exercise 5-10 times with each hand. Contact a health care provider if:  Your hand pain or discomfort gets much worse when you do an exercise.  Your hand pain or discomfort does not improve within 2 hours after you exercise. If you have any of these problems, stop doing these exercises right away. Do not do them again unless your health care provider says that you can. Get help right away if:  You develop sudden, severe hand pain or swelling. If this happens, stop doing these exercises right away. Do not do them again unless your health care provider says that you can. This information is not intended to replace advice given to you by your health care provider. Make sure you discuss any questions you have with your health care provider. Document Revised: 02/26/2019 Document Reviewed: 11/06/2018 Elsevier Patient Education  [place].

## 2020-02-10 ENCOUNTER — Other Ambulatory Visit: Payer: Self-pay

## 2020-02-10 ENCOUNTER — Ambulatory Visit (HOSPITAL_COMMUNITY)
Admission: RE | Admit: 2020-02-10 | Discharge: 2020-02-10 | Disposition: A | Discharge status: Home / Self Care | Payer: Medicare Other | Source: Ambulatory Visit | Attending: Neurological Surgery | Admitting: Neurological Surgery

## 2020-02-10 DIAGNOSIS — M4712 Other spondylosis with myelopathy, cervical region: Secondary | ICD-10-CM | POA: Diagnosis not present

## 2020-02-10 DIAGNOSIS — Z981 Arthrodesis status: Secondary | ICD-10-CM | POA: Diagnosis not present

## 2020-02-10 DIAGNOSIS — M48061 Spinal stenosis, lumbar region without neurogenic claudication: Secondary | ICD-10-CM | POA: Diagnosis not present

## 2020-02-10 DIAGNOSIS — M4312 Spondylolisthesis, cervical region: Secondary | ICD-10-CM | POA: Diagnosis not present

## 2020-02-10 DIAGNOSIS — M4802 Spinal stenosis, cervical region: Secondary | ICD-10-CM | POA: Insufficient documentation

## 2020-02-10 DIAGNOSIS — M47812 Spondylosis without myelopathy or radiculopathy, cervical region: Secondary | ICD-10-CM

## 2020-02-10 DIAGNOSIS — M4716 Other spondylosis with myelopathy, lumbar region: Secondary | ICD-10-CM | POA: Insufficient documentation

## 2020-02-10 DIAGNOSIS — M5416 Radiculopathy, lumbar region: Secondary | ICD-10-CM

## 2020-02-10 DIAGNOSIS — M5116 Intervertebral disc disorders with radiculopathy, lumbar region: Secondary | ICD-10-CM | POA: Insufficient documentation

## 2020-02-10 DIAGNOSIS — M4326 Fusion of spine, lumbar region: Secondary | ICD-10-CM | POA: Diagnosis not present

## 2020-02-10 MED ORDER — DIAZEPAM 5 MG PO TABS: 5.0000 mg | ORAL_TABLET | Freq: Once | ORAL | Status: AC

## 2020-02-10 MED ORDER — IOHEXOL 300 MG/ML  SOLN
10.0000 mL | Freq: Once | INTRAMUSCULAR | Status: AC | PRN
  Administered 2020-02-10: 09:00:00 7 mL via INTRATHECAL

## 2020-02-10 MED ORDER — ONDANSETRON HCL 4 MG/2ML IJ SOLN: 4.0000 mg | Freq: Four times a day (QID) | INTRAMUSCULAR | Status: DC | PRN

## 2020-02-10 MED ORDER — LIDOCAINE HCL (PF) 1 % IJ SOLN
5.0000 mL | Freq: Once | INTRAMUSCULAR | Status: AC
  Administered 2020-02-10: 09:00:00 5 mL via INTRADERMAL

## 2020-02-10 MED ORDER — HYDROCODONE-ACETAMINOPHEN 5-325 MG PO TABS: 1.0000 | ORAL_TABLET | ORAL | Status: DC | PRN

## 2020-02-10 MED ORDER — DIAZEPAM 5 MG PO TABS
ORAL_TABLET | ORAL | Status: AC
  Administered 2020-02-10: 06:00:00 5 mg via ORAL
  Filled 2020-02-10: qty 1

## 2020-02-10 NOTE — Discharge Instructions (Signed)

## 2020-02-10 NOTE — Procedures (Signed)
Debbie Houston is a 79 year old individual whose had significant spondylitic disease in the lower lumbar spine.  She has had a high-grade stenosis with a degenerative listhesis at L4-L5.  She also has significant osteoporosis and had a compression fracture of L3 that was treated with acrylic balloon kyphoplasty.  She also now is having upper extremity symptoms with numbness tingling and weakness and was concerned that she has significant cervical spondylosis with myelopathy she is to undergo myelogram and post myelogram CAT scan to image the entire neural axis from the craniocervical junction down.  Pre op Dx: Cervical spondylosis with myelopathy, lumbar stenosis. Post op Dx: Same Procedure: Total myelogram Surgeon: Ellene Route Puncture level: L2-3 Fluid color: Clear colorless Injection: Iohexol 300, 7 mL Findings: Severe spondylitic stenosis further evaluation with CT scanning of the cervical and lumbar spines.

## 2020-02-12 DIAGNOSIS — M48062 Spinal stenosis, lumbar region with neurogenic claudication: Secondary | ICD-10-CM | POA: Diagnosis not present

## 2020-02-12 DIAGNOSIS — M47812 Spondylosis without myelopathy or radiculopathy, cervical region: Secondary | ICD-10-CM | POA: Diagnosis not present

## 2020-02-24 DIAGNOSIS — I8312 Varicose veins of left lower extremity with inflammation: Secondary | ICD-10-CM | POA: Diagnosis not present

## 2020-02-24 DIAGNOSIS — I83812 Varicose veins of left lower extremities with pain: Secondary | ICD-10-CM | POA: Diagnosis not present

## 2020-02-26 DIAGNOSIS — I8312 Varicose veins of left lower extremity with inflammation: Secondary | ICD-10-CM | POA: Diagnosis not present

## 2020-03-04 DIAGNOSIS — I8312 Varicose veins of left lower extremity with inflammation: Secondary | ICD-10-CM | POA: Diagnosis not present

## 2020-03-08 ENCOUNTER — Other Ambulatory Visit: Payer: Self-pay | Admitting: *Deleted

## 2020-03-08 ENCOUNTER — Other Ambulatory Visit: Payer: Self-pay | Admitting: Internal Medicine

## 2020-03-08 DIAGNOSIS — K2100 Gastro-esophageal reflux disease with esophagitis, without bleeding: Secondary | ICD-10-CM

## 2020-03-08 MED ORDER — PANTOPRAZOLE SODIUM 40 MG PO TBEC: DELAYED_RELEASE_TABLET | ORAL | 0 refills | Status: AC

## 2020-03-08 MED ORDER — PANTOPRAZOLE SODIUM 40 MG PO TBEC: DELAYED_RELEASE_TABLET | ORAL | 0 refills | Status: DC

## 2020-03-15 ENCOUNTER — Ambulatory Visit (INDEPENDENT_AMBULATORY_CARE_PROVIDER_SITE_OTHER): Payer: Medicare Other | Admitting: Adult Health Nurse Practitioner

## 2020-03-15 ENCOUNTER — Other Ambulatory Visit: Payer: Self-pay

## 2020-03-15 ENCOUNTER — Encounter: Payer: Self-pay | Admitting: Adult Health Nurse Practitioner

## 2020-03-15 VITALS — BP 106/72 | HR 70 | Temp 97.3°F | Wt 113.0 lb

## 2020-03-15 DIAGNOSIS — I10 Essential (primary) hypertension: Secondary | ICD-10-CM

## 2020-03-15 DIAGNOSIS — H6502 Acute serous otitis media, left ear: Secondary | ICD-10-CM | POA: Diagnosis not present

## 2020-03-15 DIAGNOSIS — J3089 Other allergic rhinitis: Secondary | ICD-10-CM

## 2020-03-15 MED ORDER — AZITHROMYCIN 250 MG PO TABS: ORAL_TABLET | ORAL | 0 refills | Status: DC

## 2020-03-15 NOTE — Progress Notes (Signed)
Assessment and Plan:  Debbie Houston was seen today for ear pain, headache and dizziness.  Diagnoses and all orders for this visit:  Non-recurrent acute serous otitis media of left ear -     azithromycin (ZITHROMAX) 250 MG tablet; Take 2 tablets PO on day 1, then 1 tablet PO Q24H x 4 days Monitor symptoms  Non-seasonal allergic rhinitis, unspecified trigger Discussed changing antihistamines Increase water intake  Essential hypertension Continue current medications Monitor blood pressure at home; call if consistently over 130/80 Continue DASH diet.   Reminder to go to the ER if any CP, SOB, nausea, dizziness, severe HA, changes vision/speech, left arm numbness and tingling and jaw pain.   Contact office with new or worsening symptoms    Further disposition pending results of labs. Discussed med's effects and SE's.   Over 30 minutes of face to face interview, exam, counseling, chart review, and critical decision making was performed.   Future Appointments  Date Time Provider [place]  05/11/2020  2:00 PM Vicie Mutters, PA-C [id number] None  08/09/2020  1:00 PM Bo Merino, MD [facility] None    ------------------------------------------------------------------------------------------------------------------   HPI 79 y.o.female presents for evaluation gfor dizziness.  This has been a chronic issue for her.  She has noticed an increase in this over the past couple of weeks.  She has a dull headache that has caused her to wake form sleep at night.  She has increasing vertigo when she stands up.  She endorses she does not move as slowly as she should when changing positions.  She has been using claitin daily, she was using flonase but reports that it cause more irritation and she stopped.  She has been using a saline nasal spray to help sooth her nose.     She endorses left otalgia.  She has recently been fitted for hearing aids.  She does not have them intoday.  Denies any  difficulties though now she reports hearing too well.    Past Medical History:  Diagnosis Date  . Arthritis   . Benign labile hypertension   . Cancer ([place])    skin cancer on face  . Colitis   . DJD (degenerative joint disease)   . Endometrial polyp   . GERD (gastroesophageal reflux disease)   . History of basal cell carcinoma excision   . History of esophagitis   . HOH (hard of hearing)    right ear better than left  -- wears no aides  . Hyperlipidemia   . Hypothyroidism   . Osteopenia   . Prediabetes   . Vitamin D deficiency   . Wears glasses      Allergies  Allergen Reactions  . Fosamax [Alendronate Sodium] Other (See Comments)    GI upset   . Singulair [Montelukast Sodium] Other (See Comments)    Makes pt jittery  . Wellbutrin [Bupropion] Hives  . Sulfa Antibiotics Rash    Current Outpatient Medications on File Prior to Visit  Medication Sig  . acetaminophen (TYLENOL) 500 MG tablet Take 500 mg by mouth at bedtime as needed for moderate pain or headache.   . Ascorbic Acid (VITAMIN C) 1000 MG tablet Take 1,000 mg by mouth daily.   Marland Kitchen aspirin EC 81 MG tablet Take 81 mg by mouth daily.   . bisoprolol-hydrochlorothiazide (ZIAC) 2.5-6.25 MG tablet Take 1 tablet Daily for BP (Patient taking differently: Take 1 tablet by mouth daily. )  . budesonide (ENTOCORT EC) 3 MG 24 hr capsule Take 6 mg by mouth  daily.   . Calcium Carbonate-Vitamin D (CALCIUM 600+D HIGH POTENCY) 600-400 MG-UNIT tablet Take 1 tablet by mouth daily.  . Cinnamon 500 MG TABS Take 500 mg by mouth daily.   . Coenzyme Q10 (COQ10) 200 MG CAPS Take 200 mg by mouth daily.  Marland Kitchen levothyroxine (SYNTHROID) 125 MCG tablet Take 1 tablet daily on an empty stomach with only water for 30 minutes & no Antacid meds, Calcium or Magnesium for 4 hours & avoid Biotin (Patient taking differently: Take 62.5-125 mcg by mouth See admin instructions. Take 125 mcg on Tue, Thur, and Sat. Take 62.5 mcg on Sun, Mon, Wed, and Fri. Take on  an empty stomach with only water for 30 minutes & no Antacid meds, Calcium or Magnesium for 4 hours & avoid Biotin)  . loratadine (CLARITIN) 10 MG tablet Take 10 mg by mouth daily.  . Multiple Vitamins-Minerals (MULTIVITAMIN WITH MINERALS) tablet Take 1 tablet by mouth daily.  . Omega-3 Fatty Acids (FISH OIL) 1200 MG CAPS Take 1,200 mg by mouth daily.  Marland Kitchen OVER THE COUNTER MEDICATION Take 1 tablet by mouth daily. Vein Health supplement  . pantoprazole (PROTONIX) 40 MG tablet Take 1 tablet  2 x /day  for Heartburn & Indigestion  . Polyethyl Glycol-Propyl Glycol (SYSTANE) 0.4-0.3 % SOLN Place 1 drop into both eyes at bedtime.   . Probiotic Product (PROBIOTIC PO) Take 1 capsule by mouth daily.  Marland Kitchen PROLIA 60 MG/ML SOSY injection Inject 60 mg into the skin every 6 (six) months.  . simvastatin (ZOCOR) 20 MG tablet TAKE 1 TABLET BY MOUTH DAILY (Patient taking differently: Take 20 mg by mouth daily. )   No current facility-administered medications on file prior to visit.    ROS: all negative except above.   Physical Exam:  BP 106/72   Pulse 70   Temp (!) 97.3 F (36.3 C)   Wt 113 lb (51.3 kg)   SpO2 96%   BMI 19.10 kg/m   General Appearance: Well nourished, in no apparent distress. Eyes: PERRLA, EOMs, conjunctiva no swelling or erythema Sinuses: No Frontal/maxillary tenderness ENT/Mouth: Ext aud canals clear, TMs without erythema, bulging on right.  Left TM buldging, white purulent behind TM. No erythema, swelling, or exudate on post pharynx.  Tonsils not swollen or erythematous. Hearing decreased, baseline w/o hearing aids.  Neck: Supple, thyroid normal.  Respiratory: Respiratory effort normal, BS equal bilaterally without rales, rhonchi, wheezing or stridor.  Cardio: RRR with no MRGs. Brisk peripheral pulses without edema.  Abdomen: Soft, + BS.  Non tender, no guarding, rebound, hernias, masses. Lymphatics: Non tender without lymphadenopathy.  Musculoskeletal: Full ROM, 5/5 strength,  normal gait.  Skin: Warm, dry without rashes, lesions, ecchymosis.  Neuro: Cranial nerves intact. Normal muscle tone, no cerebellar symptoms. Sensation intact.  Psych: Awake and oriented X 3, normal affect, Insight and Judgment appropriate.     Garnet Sierras, NP 12:27 PM [facility]

## 2020-03-15 NOTE — Patient Instructions (Addendum)
   Stop taking Claritin and switch to Zyrtec every night.  This can make you sleepy.  If this does not work switch to [employer] daily.  The goal is to dry up the sinuses.   If you have nasal congestion use the sudafed for this.  Also use the Neti pot twice a day.  Use bottled or distilled water with this NOT tap water.   Take the antibiotic as prescribed.    Please conact office with new or worsening symptoms.

## 2020-03-18 DIAGNOSIS — I8311 Varicose veins of right lower extremity with inflammation: Secondary | ICD-10-CM | POA: Diagnosis not present

## 2020-03-26 DIAGNOSIS — I8312 Varicose veins of left lower extremity with inflammation: Secondary | ICD-10-CM | POA: Diagnosis not present

## 2020-03-26 DIAGNOSIS — M79605 Pain in left leg: Secondary | ICD-10-CM | POA: Diagnosis not present

## 2020-03-30 DIAGNOSIS — M5481 Occipital neuralgia: Secondary | ICD-10-CM | POA: Diagnosis not present

## 2020-03-30 DIAGNOSIS — M5416 Radiculopathy, lumbar region: Secondary | ICD-10-CM | POA: Diagnosis not present

## 2020-03-30 DIAGNOSIS — I10 Essential (primary) hypertension: Secondary | ICD-10-CM | POA: Diagnosis not present

## 2020-04-01 DIAGNOSIS — I8312 Varicose veins of left lower extremity with inflammation: Secondary | ICD-10-CM | POA: Diagnosis not present

## 2020-04-11 ENCOUNTER — Encounter: Payer: Medicare Other | Admitting: Physician Assistant

## 2020-04-11 NOTE — Progress Notes (Signed)
Assessment and Plan  Chronic intractable headache, unspecified headache type with imbalance/vertigo/poor sleep -     MR Brain W Wo Contrast; Future -     Ambulatory referral to Neurology -     amitriptyline (ELAVIL) 10 MG tablet; 1-2 tablets at night for headache, can take 1 tablet during the day Continue follow up with Dr. Maryjean Ka for possible cervogenic HA However will get MRI with and without to look at posterior circulation Will refer to neuro for possible migraines/evaluate for sleep study She is not obese but she does have scalloping of her tongue, poor sleep, frequent awakenings, waking up with HA, non restorative sleep.   Hypothyroidism, unspecified type -     TSH  Medication management -     CBC with Differential/Platelet -     COMPLETE METABOLIC PANEL WITH GFR -     Magnesium   Advised to go to the ER should she experience a sudden severe HA, dizziness, loss of balance, difficulty speaking or experiences weakness.     Discussed med's effects and SE's.   Over 30 minutes of exam, counseling, chart review, and critical decision making was performed.  Follow up as needed or as scheduled.   Future Appointments  Date Time Provider [place]  05/11/2020  2:00 PM Vicie Mutters, PA-C [facility] None  08/09/2020  1:00 PM Bo Merino, MD [facility] None    ------------------------------------------------------------------------------------------------------------------   HPI BP 116/72   Pulse 71   Temp 98.1 F (36.7 C)   Ht 5' 4.5" (1.638 m)   Wt 113 lb (51.3 kg)   SpO2 98%   BMI 19.10 kg/m   79 y.o.female presents for intermittent neck pain/headache for the past several months but has been worse x 3 weeks.   Dr. Ellene Route referred her to Dr. Maryjean Ka. She had a cervical CT myleogram March 24th. She saw Dr. Maryjean Ka May 12th and got a trigger point injection at her right occipital and she is doing PT.  She describes the pain as a "pressure," starts at the base  of her skull on the right, then gradually over her head and settles as pressure across her brow.  Will wake up with them in the night.  reports when the headaches progress she does experience some blurry vision/photosensitivity.  She has been having dizziness with standing, will feel spinning and feels her balance is worse x several weeks and feels it is getting worse not better She does not sleep well. Will wake up with nocturia 1-2 x, will wake up tired, will wake up with HA. She will have numbness in her feet.  Denies halos, double vision, rashes, nuchal rigidity, olfactory changes.  Has tried and failed gabapentin.  Has had discussions regarding TMJ in the past- encouraged her to discuss this at her upcoming dental appointment in March 2nd 2021  Eye doctor Dec 2020  Has seen ENT for hearing loss/dizziness, saw Dr. Wilburn Cornelia on 10/05/2019, has been on flonase but stopped due to slightly bloody nose. She now is wearing her hearing aids.    She continue to have dizziness, worse with standing.  She has had negative rheum work up.   Normal MRI 2016 brain WITHOUT contrast for dizziness- showed No acute intracranial findings. Normal for age cerebral volume with mild chronic microvascular ischemic change. Within limits of detection on noncontrast routine MR, no visible vestibular schwannoma, RIGHT temporal bone inflammatory process, or intracranial mass lesion. Asymmetric fluid in the LEFT mastoid, likely effusion.\  CT myelogram 02/10/2020 1. Lower  cervical disc degeneration and asymmetric right-sided facet osteoarthritis. Trace anterolisthesis is present at C4-5 and C7-T1, with accentuated mobility at C4-5. 2. Diffusely patent spinal canal. 3. Right foraminal narrowing at C4-5 and C5-6. 4. Moderate left foraminal stenosis at C5-6 and C7-T1.  She had normal B12, normal Iron WITH supplementation.  Normal mono.    She is on 130mg, she is on 1/2 four days a week and 1 pill 3 days a  week which was unchanged from last visit.  She is not on a biotin.  Lab Results  Component Value Date   TSH 0.07 (L) 02/03/2020      Past Medical History:  Diagnosis Date  . Arthritis   . Benign labile hypertension   . Cancer ([place]    skin cancer on face  . Colitis   . DJD (degenerative joint disease)   . Endometrial polyp   . GERD (gastroesophageal reflux disease)   . History of basal cell carcinoma excision   . History of esophagitis   . HOH (hard of hearing)    right ear better than left  -- wears no aides  . Hyperlipidemia   . Hypothyroidism   . Osteopenia   . Prediabetes   . Vitamin D deficiency   . Wears glasses      Allergies  Allergen Reactions  . Fosamax [Alendronate Sodium] Other (See Comments)    GI upset   . Singulair [Montelukast Sodium] Other (See Comments)    Makes pt jittery  . Wellbutrin [Bupropion] Hives  . Sulfa Antibiotics Rash    Current Outpatient Medications on File Prior to Visit  Medication Sig  . acetaminophen (TYLENOL) 500 MG tablet Take 500 mg by mouth at bedtime as needed for moderate pain or headache.   . Ascorbic Acid (VITAMIN C) 1000 MG tablet Take 1,000 mg by mouth daily.   .Marland Kitchenaspirin EC 81 MG tablet Take 81 mg by mouth daily.   .Marland Kitchenazithromycin (ZITHROMAX) 250 MG tablet Take 2 tablets PO on day 1, then 1 tablet PO Q24H x 4 days  . bisoprolol-hydrochlorothiazide (ZIAC) 2.5-6.25 MG tablet Take 1 tablet Daily for BP (Patient taking differently: Take 1 tablet by mouth daily. )  . budesonide (ENTOCORT EC) 3 MG 24 hr capsule Take 6 mg by mouth daily.   . Calcium Carbonate-Vitamin D (CALCIUM 600+D HIGH POTENCY) 600-400 MG-UNIT tablet Take 1 tablet by mouth daily.  . Cinnamon 500 MG TABS Take 500 mg by mouth daily.   . Coenzyme Q10 (COQ10) 200 MG CAPS Take 200 mg by mouth daily.  .Marland Kitchenlevothyroxine (SYNTHROID) 125 MCG tablet Take 1 tablet daily on an empty stomach with only water for 30 minutes & no Antacid meds, Calcium or Magnesium for 4 hours  & avoid Biotin (Patient taking differently: Take 62.5-125 mcg by mouth See admin instructions. Take 125 mcg on Tue, Thur, and Sat. Take 62.5 mcg on Sun, Mon, Wed, and Fri. Take on an empty stomach with only water for 30 minutes & no Antacid meds, Calcium or Magnesium for 4 hours & avoid Biotin)  . loratadine (CLARITIN) 10 MG tablet Take 10 mg by mouth daily.  . Multiple Vitamins-Minerals (MULTIVITAMIN WITH MINERALS) tablet Take 1 tablet by mouth daily.  . Omega-3 Fatty Acids (FISH OIL) 1200 MG CAPS Take 1,200 mg by mouth daily.  .Marland KitchenOVER THE COUNTER MEDICATION Take 1 tablet by mouth daily. Vein Health supplement  . pantoprazole (PROTONIX) 40 MG tablet Take 1 tablet  2 x /day  for  Heartburn & Indigestion  . Polyethyl Glycol-Propyl Glycol (SYSTANE) 0.4-0.3 % SOLN Place 1 drop into both eyes at bedtime.   . Probiotic Product (PROBIOTIC PO) Take 1 capsule by mouth daily.  Marland Kitchen PROLIA 60 MG/ML SOSY injection Inject 60 mg into the skin every 6 (six) months.  . simvastatin (ZOCOR) 20 MG tablet TAKE 1 TABLET BY MOUTH DAILY (Patient taking differently: Take 20 mg by mouth daily. )   No current facility-administered medications on file prior to visit.    ROS:  Review of Systems  Constitutional: Negative for chills, fever, malaise/fatigue and weight loss.  HENT: Negative for congestion, ear pain, hearing loss, sinus pain, sore throat and tinnitus.   Eyes: Positive for blurred vision. Negative for double vision and pain.  Respiratory: Negative for cough, sputum production, shortness of breath and wheezing.   Cardiovascular: Negative for chest pain, palpitations and leg swelling.  Gastrointestinal: Negative for abdominal pain, diarrhea, nausea and vomiting.  Genitourinary: Negative.   Musculoskeletal: Positive for neck pain (Right sided). Negative for back pain, falls, joint pain and myalgias.  Skin: Negative for rash.  Neurological: Positive for headaches. Negative for dizziness, tremors, sensory change,  speech change, focal weakness, loss of consciousness and weakness.  Psychiatric/Behavioral: Negative.   All other systems reviewed and are negative.    Physical Exam:  BP 116/72   Pulse 71   Temp 98.1 F (36.7 C)   Ht 5' 4.5" (1.638 m)   Wt 113 lb (51.3 kg)   SpO2 98%   BMI 19.10 kg/m   General Appearance: Well nourished, in no apparent distress. Eyes: PERRLA, EOMs, conjunctiva no swelling or erythema Sinuses: No Frontal/maxillary tenderness ENT/Mouth: Ext aud canals clear, TMs without erythema, bulging. No erythema, swelling, or exudate on post pharynx. Scalloping of her tongue with crowded mouth.  Tonsils not swollen or erythematous. Hearing normal with hearing aids in place.   Neck: Supple, thyroid normal.  Respiratory: Respiratory effort normal, BS equal bilaterally without rales, rhonchi, wheezing or stridor.  Cardio: RRR with no MRGs. Brisk peripheral pulses without edema.  Abdomen: Soft, + BS.  Non tender, no guarding, rebound, hernias, masses. Lymphatics: Non tender without lymphadenopathy.  Musculoskeletal: Full ROM, 5/5 strength, normal gait. Tenderness at base of skull/over right sternocleidomastoid and right trapezius. No point tenderness over spine.  Skin: Warm, dry without rashes, lesions, ecchymosis.  Neuro: Cranial nerves intact. Normal muscle tone, no cerebellar symptoms. Sensation intact.  Psych: Awake and oriented X 3, normal affect, Insight and Judgment appropriate.     Vicie Mutters, PA-C 3:13 PM [facility]

## 2020-04-12 ENCOUNTER — Other Ambulatory Visit: Payer: Self-pay

## 2020-04-12 ENCOUNTER — Ambulatory Visit (INDEPENDENT_AMBULATORY_CARE_PROVIDER_SITE_OTHER): Payer: Medicare Other | Admitting: Physician Assistant

## 2020-04-12 ENCOUNTER — Encounter: Payer: Self-pay | Admitting: Physician Assistant

## 2020-04-12 VITALS — BP 116/72 | HR 71 | Temp 98.1°F | Ht 64.5 in | Wt 113.0 lb

## 2020-04-12 DIAGNOSIS — G8929 Other chronic pain: Secondary | ICD-10-CM

## 2020-04-12 DIAGNOSIS — Z72820 Sleep deprivation: Secondary | ICD-10-CM | POA: Diagnosis not present

## 2020-04-12 DIAGNOSIS — R519 Headache, unspecified: Secondary | ICD-10-CM

## 2020-04-12 DIAGNOSIS — R42 Dizziness and giddiness: Secondary | ICD-10-CM | POA: Diagnosis not present

## 2020-04-12 DIAGNOSIS — Z79899 Other long term (current) drug therapy: Secondary | ICD-10-CM | POA: Diagnosis not present

## 2020-04-12 DIAGNOSIS — E039 Hypothyroidism, unspecified: Secondary | ICD-10-CM

## 2020-04-12 MED ORDER — AMITRIPTYLINE HCL 10 MG PO TABS: ORAL_TABLET | ORAL | 0 refills | Status: DC

## 2020-04-12 NOTE — Patient Instructions (Signed)
Try the amitryptline 20m 1-2 at night for headache.   Will get MRI of your brain with and without.   She was informed to call 911 if she develop any new symptoms such as worsening headaches, episodes of blurred vision, double vision or complete loss of vision or speech difficulties or motor weakness.  Will refer to neurology for headache May want sleep study for waking up with headache, poor sleep, scalloped tongue and crowded mouth but can also treat HA

## 2020-04-13 DIAGNOSIS — M2560 Stiffness of unspecified joint, not elsewhere classified: Secondary | ICD-10-CM | POA: Diagnosis not present

## 2020-04-13 DIAGNOSIS — R531 Weakness: Secondary | ICD-10-CM | POA: Diagnosis not present

## 2020-04-13 DIAGNOSIS — M542 Cervicalgia: Secondary | ICD-10-CM | POA: Diagnosis not present

## 2020-04-13 LAB — COMPLETE METABOLIC PANEL WITH GFR
AG Ratio: 3.2 (calc) — ABNORMAL HIGH (ref 1.0–2.5)
ALT: 17 U/L (ref 6–29)
AST: 19 U/L (ref 10–35)
Albumin: 4.5 g/dL (ref 3.6–5.1)
Alkaline phosphatase (APISO): 33 U/L — ABNORMAL LOW (ref 37–153)
BUN: 12 mg/dL (ref 7–25)
CO2: 28 mmol/L (ref 20–32)
Calcium: 9.6 mg/dL (ref 8.6–10.4)
Chloride: 99 mmol/L (ref 98–110)
Creat: 0.79 mg/dL (ref 0.60–0.93)
GFR, Est African American: 83 mL/min/{1.73_m2} (ref >=60)
GFR, Est Non African American: 72 mL/min/{1.73_m2} (ref >=60)
Globulin: 1.4 g/dL (calc) — ABNORMAL LOW (ref 1.9–3.7)
Glucose, Bld: 120 mg/dL — ABNORMAL HIGH (ref 65–99)
Potassium: 3.8 mmol/L (ref 3.5–5.3)
Sodium: 133 mmol/L — ABNORMAL LOW (ref 135–146)
Total Bilirubin: 0.6 mg/dL (ref 0.2–1.2)
Total Protein: 5.9 g/dL — ABNORMAL LOW (ref 6.1–8.1)

## 2020-04-13 LAB — CBC WITH DIFFERENTIAL/PLATELET
Absolute Monocytes: 472 cells/uL (ref 200–950)
Basophils Absolute: 24 cells/uL (ref 0–200)
Basophils Relative: 0.3 %
Eosinophils Absolute: 40 cells/uL (ref 15–500)
Eosinophils Relative: 0.5 %
HCT: 41.6 % (ref 35.0–45.0)
Hemoglobin: 14 g/dL (ref 11.7–15.5)
Lymphs Abs: 1568 cells/uL (ref 850–3900)
MCH: 31.8 pg (ref 27.0–33.0)
MCHC: 33.7 g/dL (ref 32.0–36.0)
MCV: 94.5 fL (ref 80.0–100.0)
MPV: 10.8 fL (ref 7.5–12.5)
Monocytes Relative: 5.9 %
Neutro Abs: 5896 cells/uL (ref 1500–7800)
Neutrophils Relative %: 73.7 %
Platelets: 173 10*3/uL (ref 140–400)
RBC: 4.4 10*6/uL (ref 3.80–5.10)
RDW: 13.6 % (ref 11.0–15.0)
Total Lymphocyte: 19.6 %
WBC: 8 10*3/uL (ref 3.8–10.8)

## 2020-04-13 LAB — MAGNESIUM: Magnesium: 2.1 mg/dL (ref 1.5–2.5)

## 2020-04-13 LAB — TSH: TSH: 0.03 mIU/L — ABNORMAL LOW (ref 0.40–4.50)

## 2020-04-14 DIAGNOSIS — M542 Cervicalgia: Secondary | ICD-10-CM | POA: Diagnosis not present

## 2020-04-14 DIAGNOSIS — M2560 Stiffness of unspecified joint, not elsewhere classified: Secondary | ICD-10-CM | POA: Diagnosis not present

## 2020-04-14 DIAGNOSIS — R531 Weakness: Secondary | ICD-10-CM | POA: Diagnosis not present

## 2020-04-15 DIAGNOSIS — I8311 Varicose veins of right lower extremity with inflammation: Secondary | ICD-10-CM | POA: Diagnosis not present

## 2020-04-15 DIAGNOSIS — I83811 Varicose veins of right lower extremities with pain: Secondary | ICD-10-CM | POA: Diagnosis not present

## 2020-04-17 ENCOUNTER — Other Ambulatory Visit: Payer: Self-pay | Admitting: Physician Assistant

## 2020-04-19 HISTORY — PX: HIP SURGERY: SHX245

## 2020-04-21 DIAGNOSIS — M2560 Stiffness of unspecified joint, not elsewhere classified: Secondary | ICD-10-CM | POA: Diagnosis not present

## 2020-04-21 DIAGNOSIS — R531 Weakness: Secondary | ICD-10-CM | POA: Diagnosis not present

## 2020-04-21 DIAGNOSIS — M542 Cervicalgia: Secondary | ICD-10-CM | POA: Diagnosis not present

## 2020-04-25 DIAGNOSIS — M2560 Stiffness of unspecified joint, not elsewhere classified: Secondary | ICD-10-CM | POA: Diagnosis not present

## 2020-04-25 DIAGNOSIS — M542 Cervicalgia: Secondary | ICD-10-CM | POA: Diagnosis not present

## 2020-04-25 DIAGNOSIS — R531 Weakness: Secondary | ICD-10-CM | POA: Diagnosis not present

## 2020-04-27 DIAGNOSIS — M542 Cervicalgia: Secondary | ICD-10-CM | POA: Diagnosis not present

## 2020-04-27 DIAGNOSIS — R531 Weakness: Secondary | ICD-10-CM | POA: Diagnosis not present

## 2020-04-27 DIAGNOSIS — M2560 Stiffness of unspecified joint, not elsewhere classified: Secondary | ICD-10-CM | POA: Diagnosis not present

## 2020-05-06 DIAGNOSIS — E785 Hyperlipidemia, unspecified: Secondary | ICD-10-CM | POA: Diagnosis not present

## 2020-05-06 DIAGNOSIS — M25551 Pain in right hip: Secondary | ICD-10-CM | POA: Diagnosis not present

## 2020-05-06 DIAGNOSIS — W1839XA Other fall on same level, initial encounter: Secondary | ICD-10-CM | POA: Diagnosis not present

## 2020-05-06 DIAGNOSIS — I959 Hypotension, unspecified: Secondary | ICD-10-CM | POA: Diagnosis not present

## 2020-05-06 DIAGNOSIS — J219 Acute bronchiolitis, unspecified: Secondary | ICD-10-CM | POA: Diagnosis not present

## 2020-05-06 DIAGNOSIS — K219 Gastro-esophageal reflux disease without esophagitis: Secondary | ICD-10-CM | POA: Diagnosis not present

## 2020-05-06 DIAGNOSIS — S72141A Displaced intertrochanteric fracture of right femur, initial encounter for closed fracture: Secondary | ICD-10-CM | POA: Diagnosis not present

## 2020-05-06 DIAGNOSIS — I10 Essential (primary) hypertension: Secondary | ICD-10-CM | POA: Diagnosis not present

## 2020-05-06 DIAGNOSIS — R519 Headache, unspecified: Secondary | ICD-10-CM | POA: Diagnosis not present

## 2020-05-06 DIAGNOSIS — S299XXA Unspecified injury of thorax, initial encounter: Secondary | ICD-10-CM | POA: Diagnosis not present

## 2020-05-06 DIAGNOSIS — M79604 Pain in right leg: Secondary | ICD-10-CM | POA: Diagnosis not present

## 2020-05-06 DIAGNOSIS — D62 Acute posthemorrhagic anemia: Secondary | ICD-10-CM | POA: Diagnosis not present

## 2020-05-06 DIAGNOSIS — Y92832 Beach as the place of occurrence of the external cause: Secondary | ICD-10-CM | POA: Diagnosis not present

## 2020-05-06 DIAGNOSIS — E039 Hypothyroidism, unspecified: Secondary | ICD-10-CM | POA: Diagnosis not present

## 2020-05-06 DIAGNOSIS — R079 Chest pain, unspecified: Secondary | ICD-10-CM | POA: Diagnosis not present

## 2020-05-06 DIAGNOSIS — Z981 Arthrodesis status: Secondary | ICD-10-CM | POA: Diagnosis not present

## 2020-05-07 DIAGNOSIS — S72141A Displaced intertrochanteric fracture of right femur, initial encounter for closed fracture: Secondary | ICD-10-CM | POA: Diagnosis not present

## 2020-05-08 DIAGNOSIS — S72121A Displaced fracture of lesser trochanter of right femur, initial encounter for closed fracture: Secondary | ICD-10-CM | POA: Diagnosis not present

## 2020-05-08 DIAGNOSIS — S72141A Displaced intertrochanteric fracture of right femur, initial encounter for closed fracture: Secondary | ICD-10-CM | POA: Diagnosis not present

## 2020-05-09 DIAGNOSIS — R600 Localized edema: Secondary | ICD-10-CM | POA: Diagnosis not present

## 2020-05-09 DIAGNOSIS — S72001A Fracture of unspecified part of neck of right femur, initial encounter for closed fracture: Secondary | ICD-10-CM | POA: Diagnosis not present

## 2020-05-09 DIAGNOSIS — R0902 Hypoxemia: Secondary | ICD-10-CM | POA: Diagnosis not present

## 2020-05-09 NOTE — Progress Notes (Deleted)
WELLNESS AND FOLLOW UP  Assessment:   Essential hypertension - continue medications, DASH diet, exercise and monitor at home. Call if greater than 130/80.  -     CBC with Differential/Platelet -     COMPLETE METABOLIC PANEL WITH GFR -     TSH  Senile purpura ([place]) Discussed process, protect skin, sunscreen  Colitis Has follow up with GI and schedule colonoscopy/EGD  Hypothyroidism, unspecified type -     TSH Hypothyroidism-check TSH level, continue medications the same, reminded to take on an empty stomach 30-19mns before food.   Osteopenia of multiple sites -     Ambulatory referral to Orthopedic Surgery Suggest prolia with recent fracture, she is unable to tolerate fosamax  Osteoarthritis, unspecified osteoarthritis type, unspecified site Increase exericse  Mixed hyperlipidemia -     Lipid panel check lipids decrease fatty foods increase activity.   Medication management -     Magnesium  Anemia, unspecified type Monitor CBC Increase leafy greens  Allergic state, initial encounter Continue medications  Vertigo Improved/stable  Vitamin D deficiency -     VITAMIN D 25 Hydroxy (Vit-D Deficiency, Fractures)  Neck pain -     Ambulatory referral to Orthopedic Surgery -     cyclobenzaprine (FLEXERIL) 10 MG tablet; Take 1 tablet (10 mg total) by mouth at bedtime. ? Contributing to HA  Temple tenderness -     cyclobenzaprine (FLEXERIL) 10 MG tablet; Take 1 tablet (10 mg total) by mouth at bedtime. -     Sedimentation rate -     C-reactive protein Will check Sed rate rule out TA Treat possible TMJ with flexeril, soft foods, heating bad Treat right ear otitis media If not better possible trigeminal neuralgia and will start on tegretol.  She was informed to call 911 if she develop any new symptoms such as worsening headaches, episodes of blurred vision, double vision or complete loss of vision or speech difficulties or motor weakness.   Over 40 minutes of exam,  counseling, chart review, and critical decision making was performed  Future Appointments Future Appointments  Date Time Provider DMancos 05/11/2020  2:00 PM CVicie Mutters PA-C [facility] None  05/12/2020 10:00 AM Dohmeier, CAsencion Partridge MD [facility] None  08/09/2020  1:00 PM DBo Merino MD [facility] None  05/15/2021  2:00 PM CVicie Mutters PA-C [facility] None      Subjective:   Debbie MCAULEYis a 79y.o. female who presents for cpe and 3 month follow up on hypertension, prediabetes, hyperlipidemia, vitamin D def.   She has history of vetebral fracture, she had a DEXA 12/2018 that showed left femur with -2.3, she can not tolerate fosamax, could not afford prolia.    She is left handed, has had neck pain for years but it has gotten worse, bilateral occipital, worse up to her right side, can be difficult to even touch that right side. She has seen PT several years ago. She will have numbness/tingling bilateral arms, worse right arm. Some pain with touching her right face.  Normal MRI 2016 brain- ordered MRI with and without contrast for posterior circulation.  She was referred to neurology for evaluation of migraines and possible sleep apnea.   She has some incontinence, worse when she waits a long time, has history of cystocele.  She has diarrhea has appointment for colonoscopy and endoscopy, on enocort which was just increased.    Her blood pressure has been controlled at home, she is on BP every other day, today their  BP is    She does workout 3 x a week. She denies chest pain, shortness of breath, dizziness.  She is on cholesterol medication and denies myalgias. Her cholesterol is not at goal. The cholesterol last visit was:   Lab Results  Component Value Date   CHOL 178 02/03/2020   HDL 60 02/03/2020   LDLCALC 98 02/03/2020   TRIG 107 02/03/2020   CHOLHDL 3.0 02/03/2020    She has been working on diet and exercise for prediabetes, and denies paresthesia of the feet,  polydipsia, polyuria and visual disturbances. Last A1C in the office was:  Lab Results  Component Value Date   HGBA1C 5.4 02/03/2020   Patient is on Vitamin D supplement.   Lab Results  Component Value Date   VD25OH 56 02/03/2020     She is on thyroid medication. Her medication was changed last visit. States she still feels tired, brittle hair, cold. Tues and Thurs, she is on a whole and the rest is a half, moved up from 1/2 pill a day.  Lab Results  Component Value Date   TSH 0.03 (L) 04/12/2020  .  BMI is There is no height or weight on file to calculate BMI., she is working on diet and exercise. Wt Readings from Last 3 Encounters:  04/12/20 113 lb (51.3 kg)  03/15/20 113 lb (51.3 kg)  02/10/20 113 lb (51.3 kg)    Medication Review  Current Outpatient Medications (Endocrine & Metabolic):  .  budesonide (ENTOCORT EC) 3 MG 24 hr capsule, Take 6 mg by mouth daily.  Marland Kitchen  levothyroxine (SYNTHROID) 125 MCG tablet, Take 1 tablet daily on an empty stomach with only water for 30 minutes & no Antacid meds, Calcium or Magnesium for 4 hours & avoid Biotin (Patient taking differently: Take 62.5-125 mcg by mouth See admin instructions. Take 125 mcg on Tue, Thur, and Sat. Take 62.5 mcg on Sun, Mon, Wed, and Fri. Take on an empty stomach with only water for 30 minutes & no Antacid meds, Calcium or Magnesium for 4 hours & avoid Biotin) .  PROLIA 60 MG/ML SOSY injection, Inject 60 mg into the skin every 6 (six) months.  Current Outpatient Medications (Cardiovascular):  .  bisoprolol-hydrochlorothiazide (ZIAC) 2.5-6.25 MG tablet, Take 1 tablet Daily for BP (Patient taking differently: Take 1 tablet by mouth daily. ) .  simvastatin (ZOCOR) 20 MG tablet, Take 1 tablet at Bedtime for Cholesterol  Current Outpatient Medications (Respiratory):  .  loratadine (CLARITIN) 10 MG tablet, Take 10 mg by mouth daily.  Current Outpatient Medications (Analgesics):  .  acetaminophen (TYLENOL) 500 MG tablet, Take  500 mg by mouth at bedtime as needed for moderate pain or headache.  Marland Kitchen  aspirin EC 81 MG tablet, Take 81 mg by mouth daily.    Current Outpatient Medications (Other):  .  amitriptyline (ELAVIL) 10 MG tablet, 1-2 tablets at night for headache, can take 1 tablet during the day .  Ascorbic Acid (VITAMIN C) 1000 MG tablet, Take 1,000 mg by mouth daily.  Marland Kitchen  azithromycin (ZITHROMAX) 250 MG tablet, Take 2 tablets PO on day 1, then 1 tablet PO Q24H x 4 days .  Calcium Carbonate-Vitamin D (CALCIUM 600+D HIGH POTENCY) 600-400 MG-UNIT tablet, Take 1 tablet by mouth daily. .  Cinnamon 500 MG TABS, Take 500 mg by mouth daily.  .  Coenzyme Q10 (COQ10) 200 MG CAPS, Take 200 mg by mouth daily. .  Multiple Vitamins-Minerals (MULTIVITAMIN WITH MINERALS) tablet, Take 1 tablet  by mouth daily. .  Omega-3 Fatty Acids (FISH OIL) 1200 MG CAPS, Take 1,200 mg by mouth daily. Marland Kitchen  OVER THE COUNTER MEDICATION, Take 1 tablet by mouth daily. Vein Health supplement .  pantoprazole (PROTONIX) 40 MG tablet, Take 1 tablet  2 x /day  for Heartburn & Indigestion .  Polyethyl Glycol-Propyl Glycol (SYSTANE) 0.4-0.3 % SOLN, Place 1 drop into both eyes at bedtime.  .  Probiotic Product (PROBIOTIC PO), Take 1 capsule by mouth daily.   Current Problems (verified) has Essential hypertension; Hyperlipidemia; Hypothyroid; GERD; Asthma; DJD (degenerative joint disease); Vitamin D deficiency; Osteopenia; Allergy; Anemia; Medication management; Colitis; Encounter for Medicare annual wellness exam; Spondylolisthesis at L4-L5 level; Otalgia of both ears; and Vertigo on their problem list.   Screening Tests Immunization History  Administered Date(s) Administered  . Influenza, High Dose Seasonal PF 07/23/2014, 08/23/2015, 07/10/2016, 08/27/2017, 08/22/2018, 08/25/2019  . PFIZER SARS-COV-2 Vaccination 12/10/2019, 12/31/2019  . Pneumococcal Conjugate-13 05/05/2015  . Pneumococcal-Unspecified 08/22/2011  . Tdap 08/22/2011  . Zoster  06/20/2006  . Zoster Recombinat (Shingrix) 09/13/2017, 01/01/2018   Health Maintenance  Topic Date Due  . INFLUENZA VACCINE  06/19/2020  . MAMMOGRAM  12/28/2020  . TETANUS/TDAP  08/21/2021  . COLONOSCOPY  05/12/2024  . DEXA SCAN  Completed  . COVID-19 Vaccine  Completed  . Hepatitis C Screening  Completed  . PNA vac Low Risk Adult  Completed   Up to date  Preventative care: Last colonoscopy: 2015 Last mammogram: 11/2017 [facility]   DEXA: 11/05/2016 PAP 2012 never abnormal  Names of Other Physician/Practitioners you currently use: 1. [facility]- here for primary care 2. Dr. Sabra Heck, eye doctor, last visit  2018 3. [facility] , dentist, last visit 2018 Patient Care Team: Unk Pinto, MD as PCP - General (Internal Medicine)  Allergies Allergies  Allergen Reactions  . Fosamax [Alendronate Sodium] Other (See Comments)    GI upset   . Singulair [Montelukast Sodium] Other (See Comments)    Makes pt jittery  . Wellbutrin [Bupropion] Hives  . Sulfa Antibiotics Rash    SURGICAL HISTORY She  has a past surgical history that includes Mohs surgery (2005); Tonsillectomy (age 39); Dilation and curettage of uterus; Dilatation & curettage/hysteroscopy with myosure (N/A, 10/27/2014); Eye surgery; and Back surgery (2020). FAMILY HISTORY Her family history includes Autism in her son; Cancer in her father, maternal grandmother, mother, and sister; Colitis in her son; Diabetes in her brother and maternal grandfather; Healthy in her son; Hyperlipidemia in her brother, father, maternal grandfather, maternal grandmother, mother, paternal grandfather, paternal grandmother, and sister; Hypertension in her brother and mother; Stroke in her maternal grandfather. SOCIAL HISTORY She  reports that she has quit smoking. Her smoking use included cigarettes. She quit after 15.00 years of use. She has never used smokeless tobacco. She reports current alcohol use. She  reports that she does not use drugs.    Objective:   There were no vitals filed for this visit. .  General appearance: alert, no distress, WD/WN,  female HEENT: normocephalic, sclerae anicteric, TMs pearly with effusion bilateral ears, nares patent, no discharge or erythema, pharynx normal Oral cavity: MMM, no lesions Neck: supple, no lymphadenopathy, no thyromegaly, no masses Heart: RRR, normal S1, S2, no murmurs Lungs: CTA bilaterally, no wheezes, rhonchi, or rales Abdomen: +bs, soft, non tender, non distended, no masses, no hepatomegaly, no splenomegaly Musculoskeletal: nontender, no swelling, no obvious deformity Extremities: no edema, no cyanosis, no clubbing Pulses: 2+ symmetric, upper and lower extremities,  normal cap refill Neurological: alert, oriented x 3, CN2-12 intact, strength normal upper extremities and lower extremities, sensation normal throughout, DTRs 2+ throughout, no cerebellar signs, gait normal Psychiatric: normal affect, behavior normal, pleasant   Vicie Mutters, PA-C

## 2020-05-11 ENCOUNTER — Encounter: Payer: Medicare Other | Admitting: Physician Assistant

## 2020-05-11 ENCOUNTER — Other Ambulatory Visit: Payer: Medicare Other

## 2020-05-12 ENCOUNTER — Institutional Professional Consult (permissible substitution): Payer: Self-pay | Admitting: Neurology

## 2020-05-14 DIAGNOSIS — J219 Acute bronchiolitis, unspecified: Secondary | ICD-10-CM | POA: Diagnosis not present

## 2020-05-14 DIAGNOSIS — D649 Anemia, unspecified: Secondary | ICD-10-CM | POA: Diagnosis not present

## 2020-05-14 DIAGNOSIS — I10 Essential (primary) hypertension: Secondary | ICD-10-CM | POA: Diagnosis not present

## 2020-05-14 DIAGNOSIS — S7291XA Unspecified fracture of right femur, initial encounter for closed fracture: Secondary | ICD-10-CM | POA: Diagnosis not present

## 2020-05-21 DIAGNOSIS — W19XXXD Unspecified fall, subsequent encounter: Secondary | ICD-10-CM | POA: Diagnosis not present

## 2020-05-21 DIAGNOSIS — E785 Hyperlipidemia, unspecified: Secondary | ICD-10-CM | POA: Diagnosis not present

## 2020-05-21 DIAGNOSIS — E039 Hypothyroidism, unspecified: Secondary | ICD-10-CM | POA: Diagnosis not present

## 2020-05-21 DIAGNOSIS — S72144D Nondisplaced intertrochanteric fracture of right femur, subsequent encounter for closed fracture with routine healing: Secondary | ICD-10-CM | POA: Diagnosis not present

## 2020-05-21 DIAGNOSIS — E440 Moderate protein-calorie malnutrition: Secondary | ICD-10-CM | POA: Diagnosis not present

## 2020-05-21 DIAGNOSIS — J219 Acute bronchiolitis, unspecified: Secondary | ICD-10-CM | POA: Diagnosis not present

## 2020-05-21 DIAGNOSIS — Z7982 Long term (current) use of aspirin: Secondary | ICD-10-CM | POA: Diagnosis not present

## 2020-05-21 DIAGNOSIS — S62111D Displaced fracture of triquetrum [cuneiform] bone, right wrist, subsequent encounter for fracture with routine healing: Secondary | ICD-10-CM | POA: Diagnosis not present

## 2020-05-21 DIAGNOSIS — I2699 Other pulmonary embolism without acute cor pulmonale: Secondary | ICD-10-CM | POA: Diagnosis not present

## 2020-05-21 DIAGNOSIS — E871 Hypo-osmolality and hyponatremia: Secondary | ICD-10-CM | POA: Diagnosis not present

## 2020-05-21 DIAGNOSIS — D696 Thrombocytopenia, unspecified: Secondary | ICD-10-CM | POA: Diagnosis not present

## 2020-05-21 DIAGNOSIS — K529 Noninfective gastroenteritis and colitis, unspecified: Secondary | ICD-10-CM | POA: Diagnosis not present

## 2020-05-21 DIAGNOSIS — K219 Gastro-esophageal reflux disease without esophagitis: Secondary | ICD-10-CM | POA: Diagnosis not present

## 2020-05-21 DIAGNOSIS — I10 Essential (primary) hypertension: Secondary | ICD-10-CM | POA: Diagnosis not present

## 2020-05-21 DIAGNOSIS — D62 Acute posthemorrhagic anemia: Secondary | ICD-10-CM | POA: Diagnosis not present

## 2020-05-21 DIAGNOSIS — Z87891 Personal history of nicotine dependence: Secondary | ICD-10-CM | POA: Diagnosis not present

## 2020-05-23 DIAGNOSIS — D62 Acute posthemorrhagic anemia: Secondary | ICD-10-CM | POA: Diagnosis not present

## 2020-05-23 DIAGNOSIS — S62111D Displaced fracture of triquetrum [cuneiform] bone, right wrist, subsequent encounter for fracture with routine healing: Secondary | ICD-10-CM | POA: Diagnosis not present

## 2020-05-23 DIAGNOSIS — S72144D Nondisplaced intertrochanteric fracture of right femur, subsequent encounter for closed fracture with routine healing: Secondary | ICD-10-CM | POA: Diagnosis not present

## 2020-05-23 DIAGNOSIS — J219 Acute bronchiolitis, unspecified: Secondary | ICD-10-CM | POA: Diagnosis not present

## 2020-05-23 DIAGNOSIS — I2699 Other pulmonary embolism without acute cor pulmonale: Secondary | ICD-10-CM | POA: Diagnosis not present

## 2020-05-23 DIAGNOSIS — W19XXXD Unspecified fall, subsequent encounter: Secondary | ICD-10-CM | POA: Diagnosis not present

## 2020-05-24 DIAGNOSIS — S62111D Displaced fracture of triquetrum [cuneiform] bone, right wrist, subsequent encounter for fracture with routine healing: Secondary | ICD-10-CM | POA: Diagnosis not present

## 2020-05-24 DIAGNOSIS — D62 Acute posthemorrhagic anemia: Secondary | ICD-10-CM | POA: Diagnosis not present

## 2020-05-24 DIAGNOSIS — S72144D Nondisplaced intertrochanteric fracture of right femur, subsequent encounter for closed fracture with routine healing: Secondary | ICD-10-CM | POA: Diagnosis not present

## 2020-05-24 DIAGNOSIS — W19XXXD Unspecified fall, subsequent encounter: Secondary | ICD-10-CM | POA: Diagnosis not present

## 2020-05-24 DIAGNOSIS — J219 Acute bronchiolitis, unspecified: Secondary | ICD-10-CM | POA: Diagnosis not present

## 2020-05-24 DIAGNOSIS — I2699 Other pulmonary embolism without acute cor pulmonale: Secondary | ICD-10-CM | POA: Diagnosis not present

## 2020-05-24 NOTE — Progress Notes (Signed)
[place]     This very nice 80 y.o.  MWF was admitted to the hospital on 05/07/2020   and patient was discharged from the hospital on  05/09/2020 to [facility] in [place] for Rehab til discharged on 20 May 2020 to home. The patient now presents for follow up for transition from recent  SNF stay.  The day after return home,  our clinical staff contacted the patient to assure stability and schedule a follow up appointment. The discharge summary, medications and diagnostic test results were reviewed before meeting with the patient. The patient was admitted for:  Name  . Hip fracture requiring operative repair, right    (05/07/2020)  . Anemia associated with acute blood loss  . Essential hypertension  . Hypokalemia  . Collagenous colitis  . Hypothyroidism  . Gastroesophageal reflux disease        [place] sustaining a Rt intertrochanteric hip fx which she had repaired by intramedullary nail fixation by a Dr Benjie Karvonen at [facility], her Hgb did drop and she received 1 unit pRBC Transfusion.On 6/21, she was transferred to a Rehabilation center for LPT til discharged 05/20/2020. Patient has now been home 5 days. Patient is ambulating with a [place]. Home PT has also been started.      Hospitalization discharge instructions and medications are reconciled with the patient.      Patient is also followed with Hypertension, Hyperlipidemia, Pre-Diabetes and Vitamin D Deficiency. Patient is followed by Dr Watt Climes for GERD & Collagenous Col;itis.      Patient is treated for HTN (2015) & BP has been controlled at home. Today's BP is at goal - 116/76. Patient has had no complaints of any cardiac type chest pain, palpitations, dyspnea/orthopnea/PND, dizziness, claudication, or dependent edema.     Hyperlipidemia is controlled with diet & meds. Patient denies myalgias or other med SE's. Last Lipids were at  goal:  Lab Results  Component Value Date   CHOL 178 02/03/2020   HDL 60 02/03/2020   LDLCALC 98 02/03/2020   TRIG 107 02/03/2020   CHOLHDL 3.0 02/03/2020      Also, the patient has history of  PreDiabetes  (A1c 5.7% / 2019)  and has had no symptoms of reactive hypoglycemia, diabetic polys, paresthesias or visual blurring.  Last A1c was  Lab Results  Component Value Date   HGBA1C 5.4 02/03/2020                                                   Patientwas discovered Hypothyroid in 1990 and started on Thyroid Replacement.     Further, the patient also has history of Vitamin D Deficiency and supplements vitamin D without any suspected side-effects. Last vitamin D was at goal:  Lab Results  Component Value Date   VD25OH 56 02/03/2020   Current Outpatient Medications on File Prior to Visit  Medication Sig  . acetaminophen (TYLENOL) 500 MG tablet Take 500 mg by mouth at bedtime as needed for moderate pain or headache.   Marland Kitchen amitriptyline (ELAVIL) 10 MG tablet 1-2 tablets at night for headache, can take 1 tablet during the day  . Ascorbic Acid (VITAMIN C) 1000 MG tablet Take 1,000 mg by mouth daily.   Marland Kitchen aspirin EC 81 MG tablet Take 81  mg by mouth daily.   . budesonide (ENTOCORT EC) 3 MG 24 hr capsule Take 6 mg by mouth daily.   . Calcium Carbonate-Vitamin D (CALCIUM 600+D HIGH POTENCY) 600-400 MG-UNIT tablet Take 1 tablet by mouth daily.  . Cinnamon 500 MG TABS Take 500 mg by mouth daily.   . Coenzyme Q10 (COQ10) 200 MG CAPS Take 200 mg by mouth daily.  Marland Kitchen levothyroxine (SYNTHROID) 125 MCG tablet Take 1 tablet daily on an empty stomach with only water for 30 minutes & no Antacid meds, Calcium or Magnesium for 4 hours & avoid Biotin (Patient taking differently: Take 62.5-125 mcg by mouth See admin instructions. Take 125 mcg on Tue, Thur, and Sat. Take 62.5 mcg on Sun, Mon, Wed, and Fri. Take on an empty stomach with only water for 30 minutes & no Antacid meds, Calcium or Magnesium for 4 hours  & avoid Biotin)  . loratadine (CLARITIN) 10 MG tablet Take 10 mg by mouth daily.  . Multiple Vitamins-Minerals (MULTIVITAMIN WITH MINERALS) tablet Take 1 tablet by mouth daily.  . Omega-3 Fatty Acids (FISH OIL) 1200 MG CAPS Take 1,200 mg by mouth daily.  Marland Kitchen OVER THE COUNTER MEDICATION Take 1 tablet by mouth daily. Vein Health supplement  . pantoprazole (PROTONIX) 40 MG tablet Take 1 tablet  2 x /day  for Heartburn & Indigestion  . Polyethyl Glycol-Propyl Glycol (SYSTANE) 0.4-0.3 % SOLN Place 1 drop into both eyes at bedtime.   . Probiotic Product (PROBIOTIC PO) Take 1 capsule by mouth daily.  Marland Kitchen PROLIA 60 MG/ML SOSY injection Inject 60 mg into the skin every 6 (six) months.  . simvastatin (ZOCOR) 20 MG tablet Take 1 tablet at Bedtime for Cholesterol  . bisoprolol-hydrochlorothiazide (ZIAC) 2.5-6.25 MG tablet Take 1 tablet Daily for BP (Patient taking differently: Take 1 tablet by mouth daily. )   No current facility-administered medications on file prior to visit.   Allergies  Allergen Reactions  . Fosamax [Alendronate Sodium] Other (See Comments)    GI upset   . Singulair [Montelukast Sodium] Other (See Comments)    Makes pt jittery  . Wellbutrin [Bupropion] Hives  . Sulfa Antibiotics Rash   PMHx:   Past Medical History:  Diagnosis Date  . Arthritis   . Benign labile hypertension   . Cancer ([place])    skin cancer on face  . Colitis   . DJD (degenerative joint disease)   . Endometrial polyp   . GERD (gastroesophageal reflux disease)   . History of basal cell carcinoma excision   . History of esophagitis   . HOH (hard of hearing)    right ear better than left  -- wears no aides  . Hyperlipidemia   . Hypothyroidism   . Osteopenia   . Prediabetes   . Vitamin D deficiency   . Wears glasses    Immunization History  Administered Date(s) Administered  . Influenza, High Dose Seasonal PF 07/23/2014, 08/23/2015, 07/10/2016, 08/27/2017, 08/22/2018, 08/25/2019  . PFIZER SARS-COV-2  Vaccination 12/10/2019, 12/31/2019  . Pneumococcal Conjugate-13 05/05/2015  . Pneumococcal-Unspecified 08/22/2011  . Tdap 08/22/2011  . Zoster 06/20/2006  . Zoster Recombinat (Shingrix) 09/13/2017, 01/01/2018   Past Surgical History:  Procedure Laterality Date  . BACK SURGERY  2020  . DILATATION & CURETTAGE/HYSTEROSCOPY WITH MYOSURE N/A 10/27/2014   Procedure: DILATATION & CURETTAGE/HYSTEROSCOPY WITH MYOSURE;  Surgeon: Darlyn Chamber, MD;  Location: [facility];  Service: Gynecology;  Laterality: N/A;  . DILATION AND CURETTAGE OF UTERUS    .  EYE SURGERY     bilateral  . MOHS SURGERY  2005   LEFT NASAL BRIDGE FOR BASAL CELL  . TONSILLECTOMY  age 37   FHx:    Reviewed / unchanged  SHx:    Reviewed / unchanged  Systems Review:  Constitutional: Denies fever, chills, wt changes, headaches, insomnia, fatigue, night sweats, change in appetite. Eyes: Denies redness, blurred vision, diplopia, discharge, itchy, watery eyes.  ENT: Denies discharge, congestion, post nasal drip, epistaxis, sore throat, earache, hearing loss, dental pain, tinnitus, vertigo, sinus pain, snoring.  CV: Denies chest pain, palpitations, irregular heartbeat, syncope, dyspnea, diaphoresis, orthopnea, PND, claudication or edema. Respiratory: denies cough, dyspnea, DOE, pleurisy, hoarseness, laryngitis, wheezing.  Gastrointestinal: Denies dysphagia, odynophagia, heartburn, reflux, water brash, abdominal pain or cramps, nausea, vomiting, bloating, diarrhea, constipation, hematemesis, melena, hematochezia  or hemorrhoids. Genitourinary: Denies dysuria, frequency, urgency, nocturia, hesitancy, discharge, hematuria or flank pain. Musculoskeletal: Denies arthralgias, myalgias, stiffness, jt. swelling, pain, limping or strain/sprain.  Skin: Denies pruritus, rash, hives, warts, acne, eczema or change in skin lesion(s). Neuro: No weakness, tremor, incoordination, spasms, paresthesia or pain. Psychiatric: Denies  confusion, memory loss or sensory loss. Endo: Denies change in weight, skin or hair change.  Heme/Lymph: No excessive bleeding, bruising or enlarged lymph nodes.  Physical Exam  BP 116/76   Pulse 92   Temp (!) 97.3 F (36.3 C)   Resp 16   Ht 5' 4.5" (1.638 m)   Wt 111 lb (50.3 kg)   BMI 18.76 kg/m   Appears well nourished, well groomed  and in no distress.  Eyes: PERRLA, EOMs, conjunctiva no swelling or erythema. Sinuses: No frontal/maxillary tenderness ENT/Mouth: EAC's clear, TM's nl w/o erythema, bulging. Nares clear w/o erythema, swelling, exudates. Oropharynx clear without erythema or exudates. Oral hygiene is good. Tongue normal, non obstructing. Hearing intact.  Neck: Supple. Thyroid nl. Car 2+/2+ without bruits, nodes or JVD. Chest: Respirations nl with BS clear & equal w/o rales, rhonchi, wheezing or stridor.  Cor: Heart sounds normal w/ regular rate and rhythm without sig. murmurs, gallops, clicks or rubs. Peripheral pulses normal and equal  without edema.  Abdomen: Soft & bowel sounds normal. Non-tender w/o guarding, rebound, hernias, masses or organomegaly.  Lymphatics: Unremarkable.  Musculoskeletal: Full ROM all peripheral extremities, joint stability, 5/5 strength and normal gait.  Skin: Warm, dry without exposed rashes, lesions or ecchymosis apparent.  Neuro: Cranial nerves intact, reflexes equal bilaterally. Sensory-motor testing grossly intact. Tendon reflexes grossly intact.  Pysch: Alert & oriented x 3.  Insight and judgement nl & appropriate. No ideations.  Assessment and Plan:   1. Hip fracture requiring operative repair, right  - Appt to be scheduled at her local Orthopedics for f/u  - Advised to take Tylenol 1,000 mg qid  and new Rx Meloxicam 15 mg qd for pain  2. Anemia associated with acute blood loss  - CBC with Differential/Platelet  3. Hypokalemia  - COMPLETE METABOLIC PANEL WITH GFR  4. Collagenous colitis  5. Essential hypertension  -  Patient requested to switch back to [place] 2.5 from the [place]  that she was changed to post-op.  - monitor blood pressure at home.  -  Reminder to go to the ER if any CP,  SOB, nausea, dizziness, severe HA, changes vision/speech.  - CBC with Differential/Platelet - COMPLETE METABOLIC PANEL WITH GFR  6. Hypothyroidism  7. Gastroesophageal reflux disease   8. Medication management  - CBC with Differential/Platelet - COMPLETE METABOLIC PANEL WITH GFR  Discussed  regular exercise, BP monitoring, weight control to achieve/maintain BMI less than 25 and discussed meds and SE's. Recommended labs to assess and monitor clinical status with further disposition pending results of labs. Over 30 minutes of exam, counseling, chart review was performed.

## 2020-05-24 NOTE — Patient Instructions (Signed)

## 2020-05-25 ENCOUNTER — Encounter: Payer: Self-pay | Admitting: Internal Medicine

## 2020-05-25 ENCOUNTER — Ambulatory Visit (INDEPENDENT_AMBULATORY_CARE_PROVIDER_SITE_OTHER): Payer: Medicare Other | Admitting: Internal Medicine

## 2020-05-25 ENCOUNTER — Other Ambulatory Visit: Payer: Self-pay

## 2020-05-25 VITALS — BP 116/76 | HR 92 | Temp 97.3°F | Resp 16 | Ht 64.5 in | Wt 111.0 lb

## 2020-05-25 DIAGNOSIS — R110 Nausea: Secondary | ICD-10-CM | POA: Diagnosis not present

## 2020-05-25 DIAGNOSIS — K52831 Collagenous colitis: Secondary | ICD-10-CM

## 2020-05-25 DIAGNOSIS — E876 Hypokalemia: Secondary | ICD-10-CM

## 2020-05-25 DIAGNOSIS — Z79899 Other long term (current) drug therapy: Secondary | ICD-10-CM

## 2020-05-25 DIAGNOSIS — K2100 Gastro-esophageal reflux disease with esophagitis, without bleeding: Secondary | ICD-10-CM | POA: Diagnosis not present

## 2020-05-25 DIAGNOSIS — E039 Hypothyroidism, unspecified: Secondary | ICD-10-CM

## 2020-05-25 DIAGNOSIS — I10 Essential (primary) hypertension: Secondary | ICD-10-CM | POA: Diagnosis not present

## 2020-05-25 DIAGNOSIS — S72001S Fracture of unspecified part of neck of right femur, sequela: Secondary | ICD-10-CM

## 2020-05-25 DIAGNOSIS — D62 Acute posthemorrhagic anemia: Secondary | ICD-10-CM

## 2020-05-25 LAB — CBC WITH DIFFERENTIAL/PLATELET
Absolute Monocytes: 461 cells/uL (ref 200–950)
Basophils Absolute: 39 cells/uL (ref 0–200)
Basophils Relative: 0.8 %
Eosinophils Absolute: 59 cells/uL (ref 15–500)
Eosinophils Relative: 1.2 %
HCT: 34.3 % — ABNORMAL LOW (ref 35.0–45.0)
Hemoglobin: 10.9 g/dL — ABNORMAL LOW (ref 11.7–15.5)
Lymphs Abs: 897 cells/uL (ref 850–3900)
MCH: 31.1 pg (ref 27.0–33.0)
MCHC: 31.8 g/dL — ABNORMAL LOW (ref 32.0–36.0)
MCV: 98 fL (ref 80.0–100.0)
MPV: 9.8 fL (ref 7.5–12.5)
Monocytes Relative: 9.4 %
Neutro Abs: 3445 cells/uL (ref 1500–7800)
Neutrophils Relative %: 70.3 %
Platelets: 468 10*3/uL — ABNORMAL HIGH (ref 140–400)
RBC: 3.5 10*6/uL — ABNORMAL LOW (ref 3.80–5.10)
RDW: 15.2 % — ABNORMAL HIGH (ref 11.0–15.0)
Total Lymphocyte: 18.3 %
WBC: 4.9 10*3/uL (ref 3.8–10.8)

## 2020-05-25 LAB — COMPLETE METABOLIC PANEL WITH GFR
AG Ratio: 2.4 (calc) (ref 1.0–2.5)
ALT: 15 U/L (ref 6–29)
AST: 20 U/L (ref 10–35)
Albumin: 4 g/dL (ref 3.6–5.1)
Alkaline phosphatase (APISO): 74 U/L (ref 37–153)
BUN: 13 mg/dL (ref 7–25)
CO2: 26 mmol/L (ref 20–32)
Calcium: 8.9 mg/dL (ref 8.6–10.4)
Chloride: 103 mmol/L (ref 98–110)
Creat: 0.86 mg/dL (ref 0.60–0.93)
GFR, Est African American: 75 mL/min/{1.73_m2} (ref >=60)
GFR, Est Non African American: 65 mL/min/{1.73_m2} (ref >=60)
Globulin: 1.7 g/dL (calc) — ABNORMAL LOW (ref 1.9–3.7)
Glucose, Bld: 104 mg/dL — ABNORMAL HIGH (ref 65–99)
Potassium: 4.1 mmol/L (ref 3.5–5.3)
Sodium: 135 mmol/L (ref 135–146)
Total Bilirubin: 0.9 mg/dL (ref 0.2–1.2)
Total Protein: 5.7 g/dL — ABNORMAL LOW (ref 6.1–8.1)

## 2020-05-25 MED ORDER — ONDANSETRON HCL 8 MG PO TABS: ORAL_TABLET | ORAL | 0 refills | Status: DC

## 2020-05-25 MED ORDER — MELOXICAM 15 MG PO TABS: ORAL_TABLET | ORAL | 0 refills | Status: DC

## 2020-05-26 NOTE — Progress Notes (Signed)
=============================================================  -    CBC - much better - Hgb up to 10.9 gm% =============================================================  -  All Else - Kidneys - Electrolytes - Liver  - all  Normal / OK =============================================================

## 2020-05-27 DIAGNOSIS — D62 Acute posthemorrhagic anemia: Secondary | ICD-10-CM | POA: Diagnosis not present

## 2020-05-27 DIAGNOSIS — S62111D Displaced fracture of triquetrum [cuneiform] bone, right wrist, subsequent encounter for fracture with routine healing: Secondary | ICD-10-CM | POA: Diagnosis not present

## 2020-05-27 DIAGNOSIS — I2699 Other pulmonary embolism without acute cor pulmonale: Secondary | ICD-10-CM | POA: Diagnosis not present

## 2020-05-27 DIAGNOSIS — S72144D Nondisplaced intertrochanteric fracture of right femur, subsequent encounter for closed fracture with routine healing: Secondary | ICD-10-CM | POA: Diagnosis not present

## 2020-05-27 DIAGNOSIS — W19XXXD Unspecified fall, subsequent encounter: Secondary | ICD-10-CM | POA: Diagnosis not present

## 2020-05-27 DIAGNOSIS — J219 Acute bronchiolitis, unspecified: Secondary | ICD-10-CM | POA: Diagnosis not present

## 2020-05-30 DIAGNOSIS — M25531 Pain in right wrist: Secondary | ICD-10-CM | POA: Diagnosis not present

## 2020-05-30 DIAGNOSIS — S72001A Fracture of unspecified part of neck of right femur, initial encounter for closed fracture: Secondary | ICD-10-CM | POA: Diagnosis not present

## 2020-06-02 DIAGNOSIS — S62111D Displaced fracture of triquetrum [cuneiform] bone, right wrist, subsequent encounter for fracture with routine healing: Secondary | ICD-10-CM | POA: Diagnosis not present

## 2020-06-02 DIAGNOSIS — S72144D Nondisplaced intertrochanteric fracture of right femur, subsequent encounter for closed fracture with routine healing: Secondary | ICD-10-CM | POA: Diagnosis not present

## 2020-06-02 DIAGNOSIS — J219 Acute bronchiolitis, unspecified: Secondary | ICD-10-CM | POA: Diagnosis not present

## 2020-06-02 DIAGNOSIS — D62 Acute posthemorrhagic anemia: Secondary | ICD-10-CM | POA: Diagnosis not present

## 2020-06-02 DIAGNOSIS — I2699 Other pulmonary embolism without acute cor pulmonale: Secondary | ICD-10-CM | POA: Diagnosis not present

## 2020-06-02 DIAGNOSIS — W19XXXD Unspecified fall, subsequent encounter: Secondary | ICD-10-CM | POA: Diagnosis not present

## 2020-06-03 DIAGNOSIS — D62 Acute posthemorrhagic anemia: Secondary | ICD-10-CM | POA: Diagnosis not present

## 2020-06-03 DIAGNOSIS — W19XXXD Unspecified fall, subsequent encounter: Secondary | ICD-10-CM | POA: Diagnosis not present

## 2020-06-03 DIAGNOSIS — I2699 Other pulmonary embolism without acute cor pulmonale: Secondary | ICD-10-CM | POA: Diagnosis not present

## 2020-06-03 DIAGNOSIS — S62111D Displaced fracture of triquetrum [cuneiform] bone, right wrist, subsequent encounter for fracture with routine healing: Secondary | ICD-10-CM | POA: Diagnosis not present

## 2020-06-03 DIAGNOSIS — J219 Acute bronchiolitis, unspecified: Secondary | ICD-10-CM | POA: Diagnosis not present

## 2020-06-03 DIAGNOSIS — S72144D Nondisplaced intertrochanteric fracture of right femur, subsequent encounter for closed fracture with routine healing: Secondary | ICD-10-CM | POA: Diagnosis not present

## 2020-06-07 DIAGNOSIS — S62111D Displaced fracture of triquetrum [cuneiform] bone, right wrist, subsequent encounter for fracture with routine healing: Secondary | ICD-10-CM | POA: Diagnosis not present

## 2020-06-07 DIAGNOSIS — D62 Acute posthemorrhagic anemia: Secondary | ICD-10-CM | POA: Diagnosis not present

## 2020-06-07 DIAGNOSIS — S72144D Nondisplaced intertrochanteric fracture of right femur, subsequent encounter for closed fracture with routine healing: Secondary | ICD-10-CM | POA: Diagnosis not present

## 2020-06-07 DIAGNOSIS — I2699 Other pulmonary embolism without acute cor pulmonale: Secondary | ICD-10-CM | POA: Diagnosis not present

## 2020-06-07 DIAGNOSIS — J219 Acute bronchiolitis, unspecified: Secondary | ICD-10-CM | POA: Diagnosis not present

## 2020-06-07 DIAGNOSIS — W19XXXD Unspecified fall, subsequent encounter: Secondary | ICD-10-CM | POA: Diagnosis not present

## 2020-06-07 DIAGNOSIS — M25551 Pain in right hip: Secondary | ICD-10-CM | POA: Diagnosis not present

## 2020-06-08 DIAGNOSIS — K529 Noninfective gastroenteritis and colitis, unspecified: Principal | ICD-10-CM

## 2020-06-15 ENCOUNTER — Other Ambulatory Visit: Payer: Self-pay

## 2020-06-15 ENCOUNTER — Ambulatory Visit (INDEPENDENT_AMBULATORY_CARE_PROVIDER_SITE_OTHER): Payer: Medicare Other | Admitting: Adult Health

## 2020-06-15 ENCOUNTER — Encounter: Payer: Self-pay | Admitting: Adult Health

## 2020-06-15 VITALS — BP 110/68 | HR 64 | Temp 97.3°F | Wt 108.0 lb

## 2020-06-15 DIAGNOSIS — M26623 Arthralgia of bilateral temporomandibular joint: Secondary | ICD-10-CM | POA: Diagnosis not present

## 2020-06-15 MED ORDER — PREDNISONE 20 MG PO TABS
ORAL_TABLET | ORAL | 0 refills | Status: DC
Start: 2020-06-15 — End: 2020-06-28

## 2020-06-15 NOTE — Progress Notes (Signed)
Assessment and Plan:  Rumor was seen today for ear pain.  Diagnoses and all orders for this visit:  Bilateral temporomandibular joint pain information given to the patient, no gum/decrease hard foods, warm wet wash clothes, decrease stress,  can do massage, and exercise.  May continue tylenol as needed for mild discomfort if needed following steroid taper Very important to follow up with dentist -     predniSONE (DELTASONE) 20 MG tablet; 2 tablets daily for 3 days, 1 tablet daily for 4 days.  Further disposition pending results of labs. Discussed med's effects and SE's.   Over 15 minutes of exam, counseling, chart review, and critical decision making was performed.   Future Appointments  Date Time Provider [place]  06/28/2020  2:30 PM Unk Pinto, MD [facility] None  08/09/2020  1:00 PM Bo Merino, MD [facility] None  05/15/2021  2:00 PM Vicie Mutters, PA-C [facility] None    ------------------------------------------------------------------------------------------------------------------   HPI BP 110/68   Pulse 64   Temp (!) 97.3 F (36.3 C)   Wt 108 lb (49 kg)   SpO2 97%   BMI 18.25 kg/m   79 y.o.female with hx of allergies, presents for 3-4 days of bilateral ear/jaw/neck discomfort. She reports woke up with sensation of fullness/discomfort in ears which has persisted intermittently. Denies sharp pain, ear discharge. Endorses ongoing intermittent tinnitus is unchanged. Denies mouth pain. Denies changes in smell or taste. Endorses mild nasal congestion. Denies runny nose or post-nasal drip. Denies sinus pain, cough, sore throat. Does endorse headache in temples intermittent, has taken tylenol with good results. Denies fever or chills. Denies vision changes or dizziness.   She denies known sick contacts, husband has no sx  Endorses hx of TMJ problems, admits more stressed related to recent hip fracture. Hasn't discussed with dentist.   Covid 53 - 2/2, 2021,  [place] claritin year round for allergy sx  Past Medical History:  Diagnosis Date  . Arthritis   . Benign labile hypertension   . Cancer ([place])    skin cancer on face  . Colitis   . DJD (degenerative joint disease)   . Endometrial polyp   . GERD (gastroesophageal reflux disease)   . History of basal cell carcinoma excision   . History of esophagitis   . HOH (hard of hearing)    right ear better than left  -- wears no aides  . Hyperlipidemia   . Hypothyroidism   . Osteopenia   . Prediabetes   . Vitamin D deficiency   . Wears glasses      Allergies  Allergen Reactions  . Fosamax [Alendronate Sodium] Other (See Comments)    GI upset   . Singulair [Montelukast Sodium] Other (See Comments)    Makes pt jittery  . Wellbutrin [Bupropion] Hives  . Sulfa Antibiotics Rash    Current Outpatient Medications on File Prior to Visit  Medication Sig  . acetaminophen (TYLENOL) 500 MG tablet Take 500 mg by mouth at bedtime as needed for moderate pain or headache.   Marland Kitchen amitriptyline (ELAVIL) 10 MG tablet 1-2 tablets at night for headache, can take 1 tablet during the day  . Ascorbic Acid (VITAMIN C) 1000 MG tablet Take 1,000 mg by mouth daily.   Marland Kitchen aspirin EC 81 MG tablet Take 81 mg by mouth daily.   . bisoprolol-hydrochlorothiazide (ZIAC) 2.5-6.25 MG tablet Take 1 tablet Daily for BP (Patient taking differently: Take 1 tablet by mouth daily. )  . budesonide (ENTOCORT EC) 3 MG  24 hr capsule Take 6 mg by mouth daily.   . Calcium Carbonate-Vitamin D (CALCIUM 600+D HIGH POTENCY) 600-400 MG-UNIT tablet Take 1 tablet by mouth daily.  . Cinnamon 500 MG TABS Take 500 mg by mouth daily.   . Coenzyme Q10 (COQ10) 200 MG CAPS Take 200 mg by mouth daily.  Marland Kitchen levothyroxine (SYNTHROID) 125 MCG tablet Take 1 tablet daily on an empty stomach with only water for 30 minutes & no Antacid meds, Calcium or Magnesium for 4 hours & avoid Biotin (Patient taking differently: Take 62.5-125 mcg by mouth See admin  instructions. Take 1/2 tablet daily)  . loratadine (CLARITIN) 10 MG tablet Take 10 mg by mouth daily.  . Multiple Vitamins-Minerals (MULTIVITAMIN WITH MINERALS) tablet Take 1 tablet by mouth daily.  . Omega-3 Fatty Acids (FISH OIL) 1200 MG CAPS Take 1,200 mg by mouth daily.  . ondansetron (ZOFRAN) 8 MG tablet Take 1/2 to 1 tablet every 6 hours  (up to 3 tabs /day) as needed for Nausea  . pantoprazole (PROTONIX) 40 MG tablet Take 1 tablet  2 x /day  for Heartburn & Indigestion  . Polyethyl Glycol-Propyl Glycol (SYSTANE) 0.4-0.3 % SOLN Place 1 drop into both eyes at bedtime.   . Probiotic Product (PROBIOTIC PO) Take 1 capsule by mouth daily.  Marland Kitchen PROLIA 60 MG/ML SOSY injection Inject 60 mg into the skin every 6 (six) months.  . simvastatin (ZOCOR) 20 MG tablet Take 1 tablet at Bedtime for Cholesterol  . TRAMADOL HCL PO Take by mouth at bedtime as needed.  . meloxicam (MOBIC) 15 MG tablet Take 1/2 to 1 tablet Daily with Food for Pain & Inflammation (Patient not taking: Reported on 06/15/2020)  . OVER THE COUNTER MEDICATION Take 1 tablet by mouth daily. Vein Health supplement (Patient not taking: Reported on 06/15/2020)   No current facility-administered medications on file prior to visit.    ROS: all negative except above.   Physical Exam:  BP 110/68   Pulse 64   Temp (!) 97.3 F (36.3 C)   Wt 108 lb (49 kg)   SpO2 97%   BMI 18.25 kg/m   General Appearance: Well dressed thin elder female with [place] in no apparent distress. Eyes: PERRLA, EOMs, conjunctiva no swelling or erythema Sinuses: No Frontal/maxillary tenderness ENT/Mouth: Ext aud canals clear, TMs without erythema, bulging. No erythema, swelling, or exudate on post pharynx.  Tonsils not swollen or erythematous. Hearing normal.  Neck: Supple, thyroid normal. Bil buccal muscular tenderness, bil TMJ tenderness without dislocation, popping, effusion or crepitus Respiratory: Respiratory effort normal, BS equal bilaterally without  rales, rhonchi, wheezing or stridor.  Cardio: RRR with no MRGs. Brisk peripheral pulses without edema.  Abdomen: Soft, + BS.  Non tender. Lymphatics: Non tender without lymphadenopathy.  Musculoskeletal: Slow gait with [place] Skin: Warm, dry without rashes, lesions, ecchymosis.  Neuro: Cranial nerves intact. Normal muscle tone Psych: Awake and oriented X 3, normal affect, Insight and Judgment appropriate.     Izora Ribas, NP 9:10 AM [facility]

## 2020-06-15 NOTE — Patient Instructions (Signed)
  What is the TMJ? The temporomandibular (tem-PUH-ro-man-DIB-yoo-ler) joint, or the TMJ, connects the upper and lower jawbones. This joint allows the jaw to open wide and move back and forth when you chew, talk, or yawn.There are also several muscles that help this joint move. There can be muscle tightness and pain in the muscle that can cause several symptoms.  What causes TMJ pain? There are many causes of TMJ pain. Repeated chewing (for example, chewing gum) and clenching your teeth can cause pain in the joint. Some TMJ pain has no obvious cause. What can I do to ease the pain? There are many things you can do to help your pain get better. When you have pain:  Eat soft foods and stay away from chewy foods (for example, taffy) Try to use both sides of your mouth to chew Don't chew gum Massage Don't open your mouth wide (for example, during yawning or singing) Don't bite your cheeks or fingernails Lower your amount of stress and worry Applying a warm, damp washcloth to the joint may help. Over-the-counter pain medicines such as ibuprofen (one brand: Advil) or acetaminophen (one brand: Tylenol) might also help. Do not use these medicines if you are allergic to them or if your doctor told you not to use them. How can I stop the pain from coming back? When your pain is better, you can do these exercises to make your muscles stronger and to keep the pain from coming back:  Resisted mouth opening: Place your thumb or two fingers under your chin and open your mouth slowly, pushing up lightly on your chin with your thumb. Hold for three to six seconds. Close your mouth slowly. Resisted mouth closing: Place your thumbs under your chin and your two index fingers on the ridge between your mouth and the bottom of your chin. Push down lightly on your chin as you close your mouth. Tongue up: Slowly open and close your mouth while keeping the tongue touching the roof of the mouth. Side-to-side jaw movement:  Place an object about one fourth of an inch thick (for example, two tongue depressors) between your front teeth. Slowly move your jaw from side to side. Increase the thickness of the object as the exercise becomes easier Forward jaw movement: Place an object about one fourth of an inch thick between your front teeth and move the bottom jaw forward so that the bottom teeth are in front of the top teeth. Increase the thickness of the object as the exercise becomes easier. These exercises should not be painful. If it hurts to do these exercises, stop doing them and talk to your family doctor.

## 2020-06-16 DIAGNOSIS — W19XXXD Unspecified fall, subsequent encounter: Secondary | ICD-10-CM | POA: Diagnosis not present

## 2020-06-16 DIAGNOSIS — D62 Acute posthemorrhagic anemia: Secondary | ICD-10-CM | POA: Diagnosis not present

## 2020-06-16 DIAGNOSIS — I2699 Other pulmonary embolism without acute cor pulmonale: Secondary | ICD-10-CM | POA: Diagnosis not present

## 2020-06-16 DIAGNOSIS — S62111D Displaced fracture of triquetrum [cuneiform] bone, right wrist, subsequent encounter for fracture with routine healing: Secondary | ICD-10-CM | POA: Diagnosis not present

## 2020-06-16 DIAGNOSIS — S72144D Nondisplaced intertrochanteric fracture of right femur, subsequent encounter for closed fracture with routine healing: Secondary | ICD-10-CM | POA: Diagnosis not present

## 2020-06-16 DIAGNOSIS — J219 Acute bronchiolitis, unspecified: Secondary | ICD-10-CM | POA: Diagnosis not present

## 2020-06-20 DIAGNOSIS — S62111D Displaced fracture of triquetrum [cuneiform] bone, right wrist, subsequent encounter for fracture with routine healing: Secondary | ICD-10-CM | POA: Diagnosis not present

## 2020-06-20 DIAGNOSIS — I2699 Other pulmonary embolism without acute cor pulmonale: Secondary | ICD-10-CM | POA: Diagnosis not present

## 2020-06-20 DIAGNOSIS — E440 Moderate protein-calorie malnutrition: Secondary | ICD-10-CM | POA: Diagnosis not present

## 2020-06-20 DIAGNOSIS — W19XXXD Unspecified fall, subsequent encounter: Secondary | ICD-10-CM | POA: Diagnosis not present

## 2020-06-20 DIAGNOSIS — E785 Hyperlipidemia, unspecified: Secondary | ICD-10-CM | POA: Diagnosis not present

## 2020-06-20 DIAGNOSIS — D62 Acute posthemorrhagic anemia: Secondary | ICD-10-CM | POA: Diagnosis not present

## 2020-06-20 DIAGNOSIS — D696 Thrombocytopenia, unspecified: Secondary | ICD-10-CM | POA: Diagnosis not present

## 2020-06-20 DIAGNOSIS — J219 Acute bronchiolitis, unspecified: Secondary | ICD-10-CM | POA: Diagnosis not present

## 2020-06-20 DIAGNOSIS — S72144D Nondisplaced intertrochanteric fracture of right femur, subsequent encounter for closed fracture with routine healing: Secondary | ICD-10-CM | POA: Diagnosis not present

## 2020-06-20 DIAGNOSIS — Z87891 Personal history of nicotine dependence: Secondary | ICD-10-CM | POA: Diagnosis not present

## 2020-06-20 DIAGNOSIS — K219 Gastro-esophageal reflux disease without esophagitis: Secondary | ICD-10-CM | POA: Diagnosis not present

## 2020-06-20 DIAGNOSIS — K529 Noninfective gastroenteritis and colitis, unspecified: Secondary | ICD-10-CM | POA: Diagnosis not present

## 2020-06-20 DIAGNOSIS — E871 Hypo-osmolality and hyponatremia: Secondary | ICD-10-CM | POA: Diagnosis not present

## 2020-06-20 DIAGNOSIS — I10 Essential (primary) hypertension: Secondary | ICD-10-CM | POA: Diagnosis not present

## 2020-06-20 DIAGNOSIS — E039 Hypothyroidism, unspecified: Secondary | ICD-10-CM | POA: Diagnosis not present

## 2020-06-20 DIAGNOSIS — Z7982 Long term (current) use of aspirin: Secondary | ICD-10-CM | POA: Diagnosis not present

## 2020-06-21 ENCOUNTER — Encounter: Payer: Self-pay | Admitting: Adult Health Nurse Practitioner

## 2020-06-21 ENCOUNTER — Other Ambulatory Visit: Payer: Self-pay

## 2020-06-21 ENCOUNTER — Ambulatory Visit (INDEPENDENT_AMBULATORY_CARE_PROVIDER_SITE_OTHER): Payer: Medicare Other | Admitting: Adult Health Nurse Practitioner

## 2020-06-21 VITALS — BP 112/72 | HR 66 | Temp 97.5°F | Wt 108.0 lb

## 2020-06-21 DIAGNOSIS — M5431 Sciatica, right side: Secondary | ICD-10-CM

## 2020-06-21 DIAGNOSIS — I10 Essential (primary) hypertension: Secondary | ICD-10-CM

## 2020-06-21 DIAGNOSIS — S72001S Fracture of unspecified part of neck of right femur, sequela: Secondary | ICD-10-CM

## 2020-06-21 DIAGNOSIS — Z79899 Other long term (current) drug therapy: Secondary | ICD-10-CM | POA: Diagnosis not present

## 2020-06-21 NOTE — Patient Instructions (Signed)
Try the meloxicam (mpobic).  Take this with food to avoid stomach upset.    Try taking benefiber daily to help bulk up your stool to decrease diarrhea.  You can also try Voltaren, Diclofenac gel.  Apply to the area 2-3 times a day.   Check at home for gabapentin (nuerontin).  Let me know what dose you have and if it is expired.  This can be something you try for nerve pain.  You would take this at night as it can make you sleepy.   Follow up with orthopedics

## 2020-06-21 NOTE — Progress Notes (Signed)
Assessment and Plan:  Chesni was seen today for leg pain and insomnia.  Diagnoses and all orders for this visit:  Hip fracture requiring operative repair, right, sequela Continue exercises as recommended by PT Follow up with Ortho  Right sciatic nerve pain Discussed Meloxicam daily with food  May also use Dicolfenac (Voltaren) gel OTC as directed Discussed use of gabapentin to help with symptoms and sleep  Essential hypertension Doing well at this time Monitor blood pressure at home; call if consistently over 130/80 Continue DASH diet.   Reminder to go to the ER if any CP, SOB, nausea, dizziness, severe HA, changes vision/speech, left arm numbness and tingling and jaw pain.  Medication Management Continued   Further disposition pending results of labs. Discussed med's effects and SE's.   Over 20 minutes of face to face interview, exam, counseling, chart review, and critical decision making was performed.   Future Appointments  Date Time Provider [place]  06/28/2020  2:30 PM Unk Pinto, MD [facility] None  08/09/2020  1:00 PM Bo Merino, MD [facility] None  05/15/2021  2:00 PM Vicie Mutters, PA-C [facility] None    ------------------------------------------------------------------------------------------------------------------   HPI 79 y.o.female presents for bilateral  Lower extremity pain.  She reports that is is worse on the right than the left.  She is having shooting pain on her right leg that it intermittent but has become more consistent over the past couple weeks.  Reports she is not sleeping well related to this pain which in turn is causing her to be in a bad mood during the day.  She reports she has not had anything like this in the past.  She did not do any heat or ice.  She has not been taking any lf the strong medication prescribed as she does not like the way it makes her feel. Reports when she was on vacation at the beach.  She picked up a ball  to throw and she fell onto the sand and fractured her right hip requiring operative repair by intramedullary nail fixation by a Dr Benjie Karvonen at [facility].  She was discharged 05/09/2020 to [facility] in [place] for Rehab with discharge on 05/20/20.  She is following up with Orthopedics here in town.  Past Medical History:  Diagnosis Date  . Arthritis   . Benign labile hypertension   . Cancer ([place])    skin cancer on face  . Colitis   . DJD (degenerative joint disease)   . Endometrial polyp   . GERD (gastroesophageal reflux disease)   . History of basal cell carcinoma excision   . History of esophagitis   . HOH (hard of hearing)    right ear better than left  -- wears no aides  . Hyperlipidemia   . Hypothyroidism   . Osteopenia   . Prediabetes   . Vitamin D deficiency   . Wears glasses      Allergies  Allergen Reactions  . Fosamax [Alendronate Sodium] Other (See Comments)    GI upset   . Singulair [Montelukast Sodium] Other (See Comments)    Makes pt jittery  . Wellbutrin [Bupropion] Hives  . Sulfa Antibiotics Rash    Current Outpatient Medications on File Prior to Visit  Medication Sig  . acetaminophen (TYLENOL) 500 MG tablet Take 500 mg by mouth at bedtime as needed for moderate pain or headache.   Marland Kitchen amitriptyline (ELAVIL) 10 MG tablet 1-2 tablets at night for headache, can take 1 tablet  during the day  . Ascorbic Acid (VITAMIN C) 1000 MG tablet Take 1,000 mg by mouth daily.   Marland Kitchen aspirin EC 81 MG tablet Take 81 mg by mouth daily.   . bisoprolol-hydrochlorothiazide (ZIAC) 2.5-6.25 MG tablet Take 1 tablet Daily for BP (Patient taking differently: Take 1 tablet by mouth daily. )  . budesonide (ENTOCORT EC) 3 MG 24 hr capsule Take 6 mg by mouth daily.   . Calcium Carbonate-Vitamin D (CALCIUM 600+D HIGH POTENCY) 600-400 MG-UNIT tablet Take 1 tablet by mouth daily.  . Cinnamon 500 MG TABS Take 500 mg by mouth daily.   . Coenzyme Q10  (COQ10) 200 MG CAPS Take 200 mg by mouth daily.  Marland Kitchen levothyroxine (SYNTHROID) 125 MCG tablet Take 1 tablet daily on an empty stomach with only water for 30 minutes & no Antacid meds, Calcium or Magnesium for 4 hours & avoid Biotin (Patient taking differently: Take 62.5-125 mcg by mouth See admin instructions. Take 1/2 tablet daily)  . loratadine (CLARITIN) 10 MG tablet Take 10 mg by mouth daily.  . meloxicam (MOBIC) 15 MG tablet Take 1/2 to 1 tablet Daily with Food for Pain & Inflammation  . Multiple Vitamins-Minerals (MULTIVITAMIN WITH MINERALS) tablet Take 1 tablet by mouth daily.  . Omega-3 Fatty Acids (FISH OIL) 1200 MG CAPS Take 1,200 mg by mouth daily.  . ondansetron (ZOFRAN) 8 MG tablet Take 1/2 to 1 tablet every 6 hours  (up to 3 tabs /day) as needed for Nausea  . OVER THE COUNTER MEDICATION Take 1 tablet by mouth daily. Vein Health supplement  . pantoprazole (PROTONIX) 40 MG tablet Take 1 tablet  2 x /day  for Heartburn & Indigestion  . Polyethyl Glycol-Propyl Glycol (SYSTANE) 0.4-0.3 % SOLN Place 1 drop into both eyes at bedtime.   . predniSONE (DELTASONE) 20 MG tablet 2 tablets daily for 3 days, 1 tablet daily for 4 days.  . Probiotic Product (PROBIOTIC PO) Take 1 capsule by mouth daily.  Marland Kitchen PROLIA 60 MG/ML SOSY injection Inject 60 mg into the skin every 6 (six) months.  . simvastatin (ZOCOR) 20 MG tablet Take 1 tablet at Bedtime for Cholesterol  . TRAMADOL HCL PO Take by mouth at bedtime as needed.   No current facility-administered medications on file prior to visit.    ROS: all negative except above.   Physical Exam:  BP 112/72   Pulse 66   Temp (!) 97.5 F (36.4 C)   Wt 108 lb (49 kg)   SpO2 97%   BMI 18.25 kg/m   General Appearance: Thin, frail, in no apparent distress. Eyes: PERRLA, EOMs, conjunctiva no swelling or erythema Sinuses: No Frontal/maxillary tenderness ENT/Mouth: Ext aud canals clear, TMs without erythema, bulging. No erythema, swelling, or exudate on  post pharynx.  Tonsils not swollen or erythematous. Hearing normal.  Neck: Supple, thyroid normal.  Respiratory: Respiratory effort normal, BS equal bilaterally without rales, rhonchi, wheezing or stridor.  Cardio: RRR with no MRGs. Brisk peripheral pulses without edema.  Abdomen: Soft, + BS.  Non tender, no guarding, rebound, hernias, masses. Lymphatics: Non tender without lymphadenopathy.  Musculoskeletal: Full ROM, 5/5 strength, normal gait.  Skin: Warm, dry without rashes, lesions, ecchymosis. Surgical incision to right hip well approximated, no exudate or erythema noted. Neuro: Cranial nerves intact. Normal muscle tone, no cerebellar symptoms. Sensation intact.  Psych: Awake and oriented X 3, normal affect, Insight and Judgment appropriate.     Garnet Sierras, NP 11:26 AM [facility]

## 2020-06-22 DIAGNOSIS — D62 Acute posthemorrhagic anemia: Secondary | ICD-10-CM | POA: Diagnosis not present

## 2020-06-22 DIAGNOSIS — S72144D Nondisplaced intertrochanteric fracture of right femur, subsequent encounter for closed fracture with routine healing: Secondary | ICD-10-CM | POA: Diagnosis not present

## 2020-06-22 DIAGNOSIS — J219 Acute bronchiolitis, unspecified: Secondary | ICD-10-CM | POA: Diagnosis not present

## 2020-06-22 DIAGNOSIS — I2699 Other pulmonary embolism without acute cor pulmonale: Secondary | ICD-10-CM | POA: Diagnosis not present

## 2020-06-22 DIAGNOSIS — W19XXXD Unspecified fall, subsequent encounter: Secondary | ICD-10-CM | POA: Diagnosis not present

## 2020-06-22 DIAGNOSIS — S62111D Displaced fracture of triquetrum [cuneiform] bone, right wrist, subsequent encounter for fracture with routine healing: Secondary | ICD-10-CM | POA: Diagnosis not present

## 2020-06-24 DIAGNOSIS — I2699 Other pulmonary embolism without acute cor pulmonale: Secondary | ICD-10-CM | POA: Diagnosis not present

## 2020-06-24 DIAGNOSIS — W19XXXD Unspecified fall, subsequent encounter: Secondary | ICD-10-CM | POA: Diagnosis not present

## 2020-06-24 DIAGNOSIS — J219 Acute bronchiolitis, unspecified: Secondary | ICD-10-CM | POA: Diagnosis not present

## 2020-06-24 DIAGNOSIS — S72144D Nondisplaced intertrochanteric fracture of right femur, subsequent encounter for closed fracture with routine healing: Secondary | ICD-10-CM | POA: Diagnosis not present

## 2020-06-24 DIAGNOSIS — D62 Acute posthemorrhagic anemia: Secondary | ICD-10-CM | POA: Diagnosis not present

## 2020-06-24 DIAGNOSIS — S62111D Displaced fracture of triquetrum [cuneiform] bone, right wrist, subsequent encounter for fracture with routine healing: Secondary | ICD-10-CM | POA: Diagnosis not present

## 2020-06-27 NOTE — Progress Notes (Signed)
History of Present Illness:      This delightful 79 yo MWF returns for 1 month f/u after recent Rt hip pinning for Fx on 05/07/2020. Since return to [facility], she is following up with Dr French Ana.        On July 28, she was treated for an acute TMJ flare with a prednisone taper. Then 1 week ago on Aug 3, she presented with bilat LE pains. She reports bilateral numbness in both LE (Hx Lumbar spinal stenosis surg in the past by Dr Ellene Route).  Medications  Current Outpatient Medications (Endocrine & Metabolic):  .  budesonide (ENTOCORT EC) 3 MG 24 hr capsule, Take 6 mg by mouth daily.  Marland Kitchen  levothyroxine (SYNTHROID) 125 MCG tablet, Take 1 tablet daily on an empty stomach with only water for 30 minutes & no Antacid meds, Calcium or Magnesium for 4 hours & avoid Biotin (Patient taking differently: Take 62.5-125 mcg by mouth See admin instructions. Take 1/2 tablet daily) .  PROLIA 60 MG/ML SOSY injection, Inject 60 mg into the skin every 6 (six) months.  Current Outpatient Medications (Cardiovascular):  .  bisoprolol-hydrochlorothiazide (ZIAC) 2.5-6.25 MG tablet, Take 1 tablet Daily for BP (Patient taking differently: Take 1 tablet by mouth daily. ) .  simvastatin (ZOCOR) 20 MG tablet, Take 1 tablet at Bedtime for Cholesterol  Current Outpatient Medications (Respiratory):  .  loratadine (CLARITIN) 10 MG tablet, Take 10 mg by mouth daily.  Current Outpatient Medications (Analgesics):  .  acetaminophen (TYLENOL) 500 MG tablet, Take 500 mg by mouth at bedtime as needed for moderate pain or headache.  Marland Kitchen  aspirin EC 81 MG tablet, Take 81 mg by mouth daily.    Current Outpatient Medications (Other):  .  amitriptyline (ELAVIL) 10 MG tablet, 1-2 tablets at night for headache, can take 1 tablet during the day .  Ascorbic Acid (VITAMIN C) 1000 MG tablet, Take 1,000 mg by mouth daily.  .  Calcium Carbonate-Vitamin D (CALCIUM 600+D HIGH POTENCY) 600-400 MG-UNIT tablet, Take 1 tablet by mouth daily. .   Cinnamon 500 MG TABS, Take 500 mg by mouth daily.  .  Coenzyme Q10 (COQ10) 200 MG CAPS, Take 200 mg by mouth daily. .  Multiple Vitamins-Minerals (MULTIVITAMIN WITH MINERALS) tablet, Take 1 tablet by mouth daily. .  Omega-3 Fatty Acids (FISH OIL) 1200 MG CAPS, Take 1,200 mg by mouth daily. .  ondansetron (ZOFRAN) 8 MG tablet, Take 1/2 to 1 tablet every 6 hours  (up to 3 tabs /day) as needed for Nausea .  OVER THE COUNTER MEDICATION, Take 1 tablet by mouth daily. Vein Health supplement .  pantoprazole (PROTONIX) 40 MG tablet, Take 1 tablet  2 x /day  for Heartburn & Indigestion .  Polyethyl Glycol-Propyl Glycol (SYSTANE) 0.4-0.3 % SOLN, Place 1 drop into both eyes at bedtime.  .  Probiotic Product (PROBIOTIC PO), Take 1 capsule by mouth daily.  Problem list She has Essential hypertension; Hyperlipidemia; Hypothyroid; GERD; Asthma; DJD (degenerative joint disease); Vitamin D deficiency; Osteopenia; Allergy; Anemia; Medication management; Colitis; Encounter for Medicare annual wellness exam; Spondylolisthesis at L4-L5 level; Vertigo; and Bilateral temporomandibular joint pain on their problem list.   Observations/Objective:  BP 132/84   Pulse 80   Temp (!) 97.4 F (36.3 C)   Resp 16   Ht 5' 4.5" (1.638 m)   Wt 110 lb (49.9 kg)   BMI 18.59 kg/m   HEENT - WNL. Neck - supple.  Chest - Clear equal BS. Cor - Nl  HS. RRR w/o sig MGR. PP 1(+). No edema. MS- FROM w/o deformities.  Gait limping with a Feick.  Neuro -  Nl w/o focal abnormalities.  Assessment and Plan:  1. Hip fracture requiring operative repair, right, sequela  - following up with Dr French Ana  2. Bilateral low back pain with sciatica - Patient relates plan to schedule appt with Dr Ellene Route   - Advised take APAP 1,000 mg 3 x /day and restart Meloxicam.  - Also advised restart her Gabapentin 100 mg  - take 2-3 caps qhs      I discussed the assessment and treatment plan with the patient. The patient was provided an  opportunity to ask questions and all were answered. The patient agreed with the plan and demonstrated an understanding of the instructions.   Kirtland Bouchard, MD

## 2020-06-28 ENCOUNTER — Ambulatory Visit (INDEPENDENT_AMBULATORY_CARE_PROVIDER_SITE_OTHER): Payer: Medicare Other | Admitting: Internal Medicine

## 2020-06-28 ENCOUNTER — Encounter: Payer: Self-pay | Admitting: Internal Medicine

## 2020-06-28 ENCOUNTER — Other Ambulatory Visit: Payer: Self-pay

## 2020-06-28 VITALS — BP 132/84 | HR 80 | Temp 97.4°F | Resp 16 | Ht 64.5 in | Wt 110.0 lb

## 2020-06-28 DIAGNOSIS — M5440 Lumbago with sciatica, unspecified side: Secondary | ICD-10-CM

## 2020-06-28 DIAGNOSIS — S72001S Fracture of unspecified part of neck of right femur, sequela: Secondary | ICD-10-CM

## 2020-06-29 ENCOUNTER — Other Ambulatory Visit: Payer: Self-pay | Admitting: Physician Assistant

## 2020-06-29 DIAGNOSIS — G8929 Other chronic pain: Secondary | ICD-10-CM

## 2020-06-30 DIAGNOSIS — M25551 Pain in right hip: Secondary | ICD-10-CM | POA: Diagnosis not present

## 2020-07-01 DIAGNOSIS — D62 Acute posthemorrhagic anemia: Secondary | ICD-10-CM | POA: Diagnosis not present

## 2020-07-01 DIAGNOSIS — W19XXXD Unspecified fall, subsequent encounter: Secondary | ICD-10-CM | POA: Diagnosis not present

## 2020-07-01 DIAGNOSIS — S72144D Nondisplaced intertrochanteric fracture of right femur, subsequent encounter for closed fracture with routine healing: Secondary | ICD-10-CM | POA: Diagnosis not present

## 2020-07-01 DIAGNOSIS — I2699 Other pulmonary embolism without acute cor pulmonale: Secondary | ICD-10-CM | POA: Diagnosis not present

## 2020-07-01 DIAGNOSIS — S62111D Displaced fracture of triquetrum [cuneiform] bone, right wrist, subsequent encounter for fracture with routine healing: Secondary | ICD-10-CM | POA: Diagnosis not present

## 2020-07-01 DIAGNOSIS — J219 Acute bronchiolitis, unspecified: Secondary | ICD-10-CM | POA: Diagnosis not present

## 2020-07-04 DIAGNOSIS — S72141D Displaced intertrochanteric fracture of right femur, subsequent encounter for closed fracture with routine healing: Secondary | ICD-10-CM | POA: Diagnosis not present

## 2020-07-04 DIAGNOSIS — M6281 Muscle weakness (generalized): Secondary | ICD-10-CM | POA: Diagnosis not present

## 2020-07-04 DIAGNOSIS — M25551 Pain in right hip: Secondary | ICD-10-CM | POA: Diagnosis not present

## 2020-07-04 DIAGNOSIS — R269 Unspecified abnormalities of gait and mobility: Secondary | ICD-10-CM | POA: Diagnosis not present

## 2020-07-07 DIAGNOSIS — M25551 Pain in right hip: Secondary | ICD-10-CM | POA: Diagnosis not present

## 2020-07-07 DIAGNOSIS — M6281 Muscle weakness (generalized): Secondary | ICD-10-CM | POA: Diagnosis not present

## 2020-07-07 DIAGNOSIS — S72141D Displaced intertrochanteric fracture of right femur, subsequent encounter for closed fracture with routine healing: Secondary | ICD-10-CM | POA: Diagnosis not present

## 2020-07-07 DIAGNOSIS — R269 Unspecified abnormalities of gait and mobility: Secondary | ICD-10-CM | POA: Diagnosis not present

## 2020-07-11 DIAGNOSIS — H52223 Regular astigmatism, bilateral: Secondary | ICD-10-CM | POA: Diagnosis not present

## 2020-07-11 DIAGNOSIS — M25551 Pain in right hip: Secondary | ICD-10-CM | POA: Diagnosis not present

## 2020-07-11 DIAGNOSIS — H04123 Dry eye syndrome of bilateral lacrimal glands: Secondary | ICD-10-CM | POA: Diagnosis not present

## 2020-07-11 DIAGNOSIS — S72141D Displaced intertrochanteric fracture of right femur, subsequent encounter for closed fracture with routine healing: Secondary | ICD-10-CM | POA: Diagnosis not present

## 2020-07-11 DIAGNOSIS — R269 Unspecified abnormalities of gait and mobility: Secondary | ICD-10-CM | POA: Diagnosis not present

## 2020-07-11 DIAGNOSIS — H524 Presbyopia: Secondary | ICD-10-CM | POA: Diagnosis not present

## 2020-07-11 DIAGNOSIS — H5202 Hypermetropia, left eye: Secondary | ICD-10-CM | POA: Diagnosis not present

## 2020-07-11 DIAGNOSIS — M6281 Muscle weakness (generalized): Secondary | ICD-10-CM | POA: Diagnosis not present

## 2020-07-13 ENCOUNTER — Other Ambulatory Visit: Payer: Self-pay

## 2020-07-13 ENCOUNTER — Encounter: Payer: Self-pay | Admitting: Adult Health

## 2020-07-13 ENCOUNTER — Ambulatory Visit: Payer: Medicare Other | Admitting: Adult Health

## 2020-07-13 VITALS — BP 122/74 | HR 73 | Temp 97.5°F | Wt 110.0 lb

## 2020-07-13 DIAGNOSIS — R269 Unspecified abnormalities of gait and mobility: Secondary | ICD-10-CM | POA: Diagnosis not present

## 2020-07-13 DIAGNOSIS — R300 Dysuria: Secondary | ICD-10-CM

## 2020-07-13 DIAGNOSIS — M6281 Muscle weakness (generalized): Secondary | ICD-10-CM | POA: Diagnosis not present

## 2020-07-13 DIAGNOSIS — R350 Frequency of micturition: Secondary | ICD-10-CM

## 2020-07-13 DIAGNOSIS — S72141D Displaced intertrochanteric fracture of right femur, subsequent encounter for closed fracture with routine healing: Secondary | ICD-10-CM | POA: Diagnosis not present

## 2020-07-13 DIAGNOSIS — M25551 Pain in right hip: Secondary | ICD-10-CM | POA: Diagnosis not present

## 2020-07-13 NOTE — Progress Notes (Signed)
Assessment and Plan:  Careena was seen today for urinary tract infection.  Diagnoses and all orders for this visit:  Dysuria Frequency of urination UTI versus OAB versus vaginal dryness - will check UA, C&S. Discussed ABX but she prefers to wait until confirmed by lab - hygiene reviewed; push water intake - Contact office if any new or worsening sx  -     Urinalysis, Routine w reflex microscopic  Further disposition pending results of labs. Discussed med's effects and SE's.   Over 15 minutes of exam, counseling, chart review, and critical decision making was performed.   Future Appointments  Date Time Provider [place]  08/09/2020  1:00 PM Bo Merino, MD [facility] None  08/17/2020  9:30 AM Liane Comber, NP [facility] None  11/23/2020  2:30 PM Unk Pinto, MD [facility] None  05/15/2021  2:00 PM Vicie Mutters, PA-C [facility] None    ------------------------------------------------------------------------------------------------------------------   HPI BP 122/74   Pulse 73   Temp (!) 97.5 F (36.4 C)   Wt 110 lb (49.9 kg)   SpO2 98%   BMI 18.59 kg/m   79 y.o.female presents for evaluation of 4-5 days of intermittent but progressive frequency, dysuria, urgency. She admits hasn't paid attention to urine character, hasn't noted anything unusual. Endorses some intermittent pelvic pressure.   Denies abdominal pain, nausea, flank/back pain, fever/chills She is not sexually active, denies vaginal discharge or perineal burning.  She admits to inadequate water intake, drinks sweet tea and coffee  Last UTI was several years ago. She did complete a zpak in April 2021.   Past Medical History:  Diagnosis Date  . Arthritis   . Benign labile hypertension   . Cancer ([place])    skin cancer on face  . Colitis   . DJD (degenerative joint disease)   . Endometrial polyp   . GERD (gastroesophageal reflux disease)   . History of basal cell carcinoma excision   .  History of esophagitis   . HOH (hard of hearing)    right ear better than left  -- wears no aides  . Hyperlipidemia   . Hypothyroidism   . Osteopenia   . Prediabetes   . Vitamin D deficiency   . Wears glasses      Allergies  Allergen Reactions  . Fosamax [Alendronate Sodium] Other (See Comments)    GI upset   . Singulair [Montelukast Sodium] Other (See Comments)    Makes pt jittery  . Wellbutrin [Bupropion] Hives  . Sulfa Antibiotics Rash    Current Outpatient Medications on File Prior to Visit  Medication Sig  . acetaminophen (TYLENOL) 500 MG tablet Take 500 mg by mouth at bedtime as needed for moderate pain or headache.   Marland Kitchen amitriptyline (ELAVIL) 10 MG tablet TAKE 1-2 TABLETS BY MOUTH AT NIGHT FOR HEADACHE, CAN TAKE 1 TABLET DURING THE DAY  . Ascorbic Acid (VITAMIN C) 1000 MG tablet Take 1,000 mg by mouth daily.   Marland Kitchen aspirin EC 81 MG tablet Take 81 mg by mouth daily.   . bisoprolol-hydrochlorothiazide (ZIAC) 2.5-6.25 MG tablet Take 1 tablet Daily for BP (Patient taking differently: Take 1 tablet by mouth daily. )  . budesonide (ENTOCORT EC) 3 MG 24 hr capsule Take 6 mg by mouth daily.   . Calcium Carbonate-Vitamin D (CALCIUM 600+D HIGH POTENCY) 600-400 MG-UNIT tablet Take 1 tablet by mouth daily.  . Cinnamon 500 MG TABS Take 500 mg by mouth daily.   . Coenzyme Q10 (COQ10) 200 MG CAPS Take 200 mg  by mouth daily.  Marland Kitchen levothyroxine (SYNTHROID) 125 MCG tablet Take 1 tablet daily on an empty stomach with only water for 30 minutes & no Antacid meds, Calcium or Magnesium for 4 hours & avoid Biotin (Patient taking differently: Take 62.5-125 mcg by mouth See admin instructions. Take 1/2 tablet daily)  . loratadine (CLARITIN) 10 MG tablet Take 10 mg by mouth daily.  . Multiple Vitamins-Minerals (MULTIVITAMIN WITH MINERALS) tablet Take 1 tablet by mouth daily.  . Omega-3 Fatty Acids (FISH OIL) 1200 MG CAPS Take 1,200 mg by mouth daily.  . pantoprazole (PROTONIX) 40 MG tablet Take 1 tablet  2  x /day  for Heartburn & Indigestion  . Polyethyl Glycol-Propyl Glycol (SYSTANE) 0.4-0.3 % SOLN Place 1 drop into both eyes at bedtime.   . Probiotic Product (PROBIOTIC PO) Take 1 capsule by mouth daily.  Marland Kitchen PROLIA 60 MG/ML SOSY injection Inject 60 mg into the skin every 6 (six) months.  . simvastatin (ZOCOR) 20 MG tablet Take 1 tablet at Bedtime for Cholesterol  . ondansetron (ZOFRAN) 8 MG tablet Take 1/2 to 1 tablet every 6 hours  (up to 3 tabs /day) as needed for Nausea (Patient not taking: Reported on 07/13/2020)  . OVER THE COUNTER MEDICATION Take 1 tablet by mouth daily. Vein Health supplement (Patient not taking: Reported on 07/13/2020)   No current facility-administered medications on file prior to visit.    ROS: all negative except above.   Physical Exam:  BP 122/74   Pulse 73   Temp (!) 97.5 F (36.4 C)   Wt 110 lb (49.9 kg)   SpO2 98%   BMI 18.59 kg/m   General Appearance: Well nourished, well dressed elderly female in no apparent distress. Eyes: PERRLA, conjunctiva no swelling or erythema ENT/Mouth: Mask in place; Hearing normal.  Neck: Supple Respiratory: Respiratory effort normal, BS equal bilaterally without rales, rhonchi, wheezing or stridor.  Cardio: RRR with no MRGs. Brisk peripheral pulses without edema.  Abdomen: Soft, + BS.  Mild generalized lower abdominal tenderness, no guarding, rebound, hernias, palpable masses. Bladder non-distended. No CVA tenderness. Lymphatics: Non tender without lymphadenopathy.  Musculoskeletal: Slow steady gait with cane Skin: Warm, dry without rashes, lesions, ecchymosis.  Neuro: Normal muscle tone Psych: Awake and oriented X 3, normal affect, Insight and Judgment appropriate.     Izora Ribas, NP 2:11 PM [facility]

## 2020-07-13 NOTE — Patient Instructions (Addendum)
     Dysuria Dysuria is pain or discomfort while urinating. The pain or discomfort may be felt in the part of your body that drains urine from the bladder (urethra) or in the surrounding tissue of the genitals. The pain may also be felt in the groin area, lower abdomen, or lower back. You may have to urinate frequently or have the sudden feeling that you have to urinate (urgency). Dysuria can affect both men and women, but it is more common in women. Dysuria can be caused by many different things, including:  Urinary tract infection.  Kidney stones or bladder stones.  Certain sexually transmitted infections (STIs), such as chlamydia.  Dehydration.  Inflammation of the tissues of the vagina.  Use of certain medicines.  Use of certain soaps or scented products that cause irritation. Follow these instructions at home: General instructions  Watch your condition for any changes.  Urinate often. Avoid holding urine for long periods of time.  After a bowel movement or urination, women should cleanse from front to back, using each tissue only once.  Urinate after sexual intercourse.  Keep all follow-up visits as told by your health care provider. This is important.  If you had any tests done to find the cause of dysuria, it is up to you to get your test results. Ask your health care provider, or the department that is doing the test, when your results will be ready. Eating and drinking   Drink enough fluid to keep your urine pale yellow.  Avoid caffeine, tea, and alcohol. They can irritate the bladder and make dysuria worse. In men, alcohol may irritate the prostate. Medicines  Take over-the-counter and prescription medicines only as told by your health care provider.  If you were prescribed an antibiotic medicine, take it as told by your health care provider. Do not stop taking the antibiotic even if you start to feel better. Contact a health care provider if:  You have a  fever.  You develop pain in your back or sides.  You have nausea or vomiting.  You have blood in your urine.  You are not urinating as often as you usually do. Get help right away if:  Your pain is severe and not relieved with medicines.  You cannot eat or drink without vomiting.  You are confused.  You have a rapid heartbeat while at rest.  You have shaking or chills.  You feel extremely weak. Summary  Dysuria is pain or discomfort while urinating. Many different conditions can lead to dysuria.  If you have dysuria, you may have to urinate frequently or have the sudden feeling that you have to urinate (urgency).  Watch your condition for any changes. Keep all follow-up visits as told by your health care provider.  Make sure that you urinate often and drink enough fluid to keep your urine pale yellow. This information is not intended to replace advice given to you by your health care provider. Make sure you discuss any questions you have with your health care provider. Document Revised: 10/18/2017 Document Reviewed: 08/22/2017 Elsevier Patient Education  [employer].

## 2020-07-14 ENCOUNTER — Other Ambulatory Visit: Payer: Self-pay | Admitting: Adult Health

## 2020-07-14 DIAGNOSIS — N3000 Acute cystitis without hematuria: Secondary | ICD-10-CM

## 2020-07-14 LAB — URINALYSIS, ROUTINE W REFLEX MICROSCOPIC
Bilirubin Urine: NEGATIVE
Glucose, UA: NEGATIVE
Hgb urine dipstick: NEGATIVE
Hyaline Cast: NONE SEEN /LPF
Ketones, ur: NEGATIVE
Nitrite: NEGATIVE
Protein, ur: NEGATIVE
RBC / HPF: NONE SEEN /HPF (ref 0–2)
Specific Gravity, Urine: 1.006 (ref 1.001–1.03)
pH: 7 (ref 5.0–8.0)

## 2020-07-14 MED ORDER — CEFDINIR 300 MG PO CAPS: 300.0000 mg | ORAL_CAPSULE | Freq: Two times a day (BID) | ORAL | 0 refills | Status: DC

## 2020-07-19 DIAGNOSIS — M25551 Pain in right hip: Secondary | ICD-10-CM | POA: Diagnosis not present

## 2020-07-19 DIAGNOSIS — S72141D Displaced intertrochanteric fracture of right femur, subsequent encounter for closed fracture with routine healing: Secondary | ICD-10-CM | POA: Diagnosis not present

## 2020-07-19 DIAGNOSIS — M6281 Muscle weakness (generalized): Secondary | ICD-10-CM | POA: Diagnosis not present

## 2020-07-19 DIAGNOSIS — R269 Unspecified abnormalities of gait and mobility: Secondary | ICD-10-CM | POA: Diagnosis not present

## 2020-07-20 DIAGNOSIS — M810 Age-related osteoporosis without current pathological fracture: Secondary | ICD-10-CM | POA: Diagnosis not present

## 2020-07-20 DIAGNOSIS — R269 Unspecified abnormalities of gait and mobility: Secondary | ICD-10-CM | POA: Diagnosis not present

## 2020-07-20 DIAGNOSIS — R5383 Other fatigue: Secondary | ICD-10-CM | POA: Diagnosis not present

## 2020-07-20 DIAGNOSIS — S72141D Displaced intertrochanteric fracture of right femur, subsequent encounter for closed fracture with routine healing: Secondary | ICD-10-CM | POA: Diagnosis not present

## 2020-07-20 DIAGNOSIS — M25551 Pain in right hip: Secondary | ICD-10-CM | POA: Diagnosis not present

## 2020-07-20 DIAGNOSIS — M6281 Muscle weakness (generalized): Secondary | ICD-10-CM | POA: Diagnosis not present

## 2020-07-20 DIAGNOSIS — E559 Vitamin D deficiency, unspecified: Secondary | ICD-10-CM | POA: Diagnosis not present

## 2020-07-26 ENCOUNTER — Ambulatory Visit: Admit: 2020-07-26 | Discharge: 2020-07-27 | Payer: MEDICARE | Attending: Internal Medicine | Primary: Internal Medicine

## 2020-07-26 DIAGNOSIS — R109 Unspecified abdominal pain: Principal | ICD-10-CM

## 2020-07-26 DIAGNOSIS — Z1159 Encounter for screening for other viral diseases: Principal | ICD-10-CM

## 2020-07-26 DIAGNOSIS — K529 Noninfective gastroenteritis and colitis, unspecified: Principal | ICD-10-CM

## 2020-07-26 DIAGNOSIS — K52839 Microscopic colitis, unspecified: Principal | ICD-10-CM

## 2020-07-26 DIAGNOSIS — K219 Gastro-esophageal reflux disease without esophagitis: Secondary | ICD-10-CM | POA: Diagnosis not present

## 2020-07-26 DIAGNOSIS — R1010 Upper abdominal pain, unspecified: Secondary | ICD-10-CM | POA: Diagnosis not present

## 2020-07-26 DIAGNOSIS — E7800 Pure hypercholesterolemia, unspecified: Secondary | ICD-10-CM | POA: Diagnosis not present

## 2020-07-26 DIAGNOSIS — Z79899 Other long term (current) drug therapy: Secondary | ICD-10-CM | POA: Diagnosis not present

## 2020-07-26 DIAGNOSIS — R490 Dysphonia: Secondary | ICD-10-CM | POA: Diagnosis not present

## 2020-07-26 DIAGNOSIS — I10 Essential (primary) hypertension: Secondary | ICD-10-CM | POA: Diagnosis not present

## 2020-07-26 DIAGNOSIS — R197 Diarrhea, unspecified: Secondary | ICD-10-CM | POA: Diagnosis not present

## 2020-07-26 DIAGNOSIS — M25559 Pain in unspecified hip: Secondary | ICD-10-CM | POA: Diagnosis not present

## 2020-07-26 DIAGNOSIS — Z87891 Personal history of nicotine dependence: Secondary | ICD-10-CM | POA: Diagnosis not present

## 2020-07-26 DIAGNOSIS — Z882 Allergy status to sulfonamides status: Secondary | ICD-10-CM | POA: Diagnosis not present

## 2020-07-26 DIAGNOSIS — M8580 Other specified disorders of bone density and structure, unspecified site: Secondary | ICD-10-CM | POA: Diagnosis not present

## 2020-07-26 DIAGNOSIS — Z7982 Long term (current) use of aspirin: Secondary | ICD-10-CM | POA: Diagnosis not present

## 2020-07-26 MED ORDER — PANTOPRAZOLE 40 MG TABLET,DELAYED RELEASE
ORAL_TABLET | 3 refills | 0.00000 days | Status: CP
Start: 2020-07-26 — End: ?

## 2020-07-26 MED ORDER — COLESTIPOL 1 GRAM TABLET
ORAL_TABLET | 3 refills | 0.00000 days | Status: CP
Start: 2020-07-26 — End: ?

## 2020-07-27 DIAGNOSIS — M48062 Spinal stenosis, lumbar region with neurogenic claudication: Secondary | ICD-10-CM | POA: Diagnosis not present

## 2020-07-27 NOTE — Progress Notes (Signed)
Office Visit Note  Patient: Debbie Houston             Date of Birth: 09-Apr-1941           MRN: [id number]             PCP: Unk Pinto, MD Referring: Unk Pinto, MD Visit Date: 08/09/2020 Occupation: @GUAROCC @  Subjective:  Other (patient reports hip surgery in 04/2020 from a fall )   History of Present Illness: Debbie Houston is a 79 y.o. female with history of osteoarthritis.  She states she fell at the beach in June and after that she fractured her right femur.  She has a rod in her right femur with a screw placement.  She had the surgery at the beach.  Is recovering gradually.  He has been getting physical therapy for her hip joint.  She states she has been experiencing pain in her right knee joint.  She was seen by Dr. French Ana who evaluated her knee joint and also schedule MRI of her right knee joint.  She brought the right knee joint results today.  She had no meniscal tear.  She had some bone marrow edema around the rod.  And some mild degenerative changes.  She had lumbar spine surgery last year.  She continues to have some neuropathy.  She was evaluated by Dr. Ellene Route and was placed on gabapentin.  Activities of Daily Living:  Patient reports morning stiffness for 1  hour.   Patient Reports nocturnal pain.  Difficulty dressing/grooming: Denies Difficulty climbing stairs: Denies Difficulty getting out of chair: Denies Difficulty using hands for taps, buttons, cutlery, and/or writing: Denies  Review of Systems  Constitutional: Positive for fatigue.  HENT: Negative for mouth sores, mouth dryness and nose dryness.   Eyes: Positive for dryness. Negative for pain and itching.  Respiratory: Negative for shortness of breath and difficulty breathing.   Cardiovascular: Negative for chest pain and palpitations.  Gastrointestinal: Positive for diarrhea. Negative for blood in stool and constipation.  Endocrine: Negative for increased urination.  Genitourinary: Negative for  difficulty urinating.  Musculoskeletal: Positive for arthralgias, joint pain, joint swelling, myalgias, morning stiffness, muscle tenderness and myalgias.  Skin: Negative for color change, rash and redness.  Allergic/Immunologic: Negative for susceptible to infections.  Neurological: Positive for dizziness, numbness and weakness. Negative for headaches and memory loss.  Hematological: Positive for bruising/bleeding tendency.  Psychiatric/Behavioral: Negative for confusion and sleep disturbance.    PMFS History:  Patient Active Problem List   Diagnosis Date Noted  . Bilateral temporomandibular joint pain 06/15/2020  . Vertigo 03/25/2019  . Spondylolisthesis at L4-L5 level 11/25/2018  . Encounter for Medicare annual wellness exam 07/07/2015  . Colitis 12/21/2014  . Medication management 04/20/2014  . Essential hypertension   . Hyperlipidemia   . Hypothyroid   . GERD   . Asthma   . DJD (degenerative joint disease)   . Vitamin D deficiency   . Osteopenia   . Allergy   . Anemia     Past Medical History:  Diagnosis Date  . Arthritis   . Benign labile hypertension   . Cancer ([place])    skin cancer on face  . Colitis   . DJD (degenerative joint disease)   . Endometrial polyp   . GERD (gastroesophageal reflux disease)   . History of basal cell carcinoma excision   . History of esophagitis   . HOH (hard of hearing)    right ear better than left  --  wears no aides  . Hyperlipidemia   . Hypothyroidism   . Osteopenia   . Prediabetes   . Vitamin D deficiency   . Wears glasses     Family History  Problem Relation Age of Onset  . Hypertension Mother   . Cancer Mother        COLON  . Hyperlipidemia Mother   . Cancer Father        BREAST WITH BRAIN METS  . Hyperlipidemia Father   . Cancer Sister        COLON (TEENS), BREAST (28)  . Hyperlipidemia Sister   . Autism Son   . Colitis Son   . Healthy Son   . Diabetes Brother   . Hyperlipidemia Brother   . Hypertension  Brother   . Cancer Maternal Grandmother        RENAL  . Hyperlipidemia Maternal Grandmother   . Diabetes Maternal Grandfather   . Stroke Maternal Grandfather   . Hyperlipidemia Maternal Grandfather   . Hyperlipidemia Paternal Grandmother   . Hyperlipidemia Paternal Grandfather    Past Surgical History:  Procedure Laterality Date  . BACK SURGERY  2020  . DILATATION & CURETTAGE/HYSTEROSCOPY WITH MYOSURE N/A 10/27/2014   Procedure: DILATATION & CURETTAGE/HYSTEROSCOPY WITH MYOSURE;  Surgeon: Darlyn Chamber, MD;  Location: [facility];  Service: Gynecology;  Laterality: N/A;  . DILATION AND CURETTAGE OF UTERUS    . EYE SURGERY     bilateral  . HIP SURGERY Right 04/2020   rod and screw in right hip   . MOHS SURGERY  2005   LEFT NASAL BRIDGE FOR BASAL CELL  . TONSILLECTOMY  age 60   Social History   Social History Narrative  . Not on file   Immunization History  Administered Date(s) Administered  . Influenza, High Dose Seasonal PF 07/23/2014, 08/23/2015, 07/10/2016, 08/27/2017, 08/22/2018, 08/25/2019  . PFIZER SARS-COV-2 Vaccination 12/10/2019, 12/31/2019  . Pneumococcal Conjugate-13 05/05/2015  . Pneumococcal-Unspecified 08/22/2011  . Tdap 08/22/2011  . Zoster 06/20/2006  . Zoster Recombinat (Shingrix) 09/13/2017, 01/01/2018     Objective: Vital Signs: BP 137/82 (BP Location: Left Arm, Patient Position: Sitting, Cuff Size: Normal)   Pulse 70   Resp 13   Ht 5' 5"  (1.651 m)   Wt 113 lb 12.8 oz (51.6 kg)   BMI 18.94 kg/m    Physical Exam Vitals and nursing note reviewed.  Constitutional:      Appearance: She is well-developed.  HENT:     Head: Normocephalic and atraumatic.  Eyes:     Conjunctiva/sclera: Conjunctivae normal.  Cardiovascular:     Rate and Rhythm: Normal rate and regular rhythm.     Heart sounds: Normal heart sounds.  Pulmonary:     Effort: Pulmonary effort is normal.     Breath sounds: Normal breath sounds.  Abdominal:     General:  Bowel sounds are normal.     Palpations: Abdomen is soft.  Musculoskeletal:     Cervical back: Normal range of motion.  Lymphadenopathy:     Cervical: No cervical adenopathy.  Skin:    General: Skin is warm and dry.     Capillary Refill: Capillary refill takes less than 2 seconds.  Neurological:     Mental Status: She is alert and oriented to person, place, and time.  Psychiatric:        Behavior: Behavior normal.      Musculoskeletal Exam: Splint was in good range of motion.  Shoulder joints, elbow joints, wrist joints  with good range of motion.  She has bilateral DIP thickening.  She had discomfort range of motion of her right hip joint due to recent surgery.  She has some discomfort range of motion of her right knee joint.  Left knee joint with good range of motion.  Some DIP changes were noted in her feet. CDAI Exam: CDAI Score: -- Patient Global: --; Provider Global: -- Swollen: --; Tender: -- Joint Exam 08/09/2020   No joint exam has been documented for this visit   There is currently no information documented on the homunculus. Go to the Rheumatology activity and complete the homunculus joint exam.  Investigation: No additional findings.  Imaging: No results found.  Recent Labs: Lab Results  Component Value Date   WBC 4.9 05/25/2020   HGB 10.9 (L) 05/25/2020   PLT 468 (H) 05/25/2020   NA 135 05/25/2020   K 4.1 05/25/2020   CL 103 05/25/2020   CO2 26 05/25/2020   GLUCOSE 104 (H) 05/25/2020   BUN 13 05/25/2020   CREATININE 0.86 05/25/2020   BILITOT 0.9 05/25/2020   ALKPHOS 51 06/13/2017   AST 20 05/25/2020   ALT 15 05/25/2020   PROT 5.7 (L) 05/25/2020   ALBUMIN 4.1 06/13/2017   CALCIUM 8.9 05/25/2020   GFRAA 75 05/25/2020    Speciality Comments: No specialty comments available.  Procedures:  No procedures performed Allergies: Fosamax [alendronate sodium], Singulair [montelukast sodium], Wellbutrin [bupropion], and Sulfa antibiotics   Assessment /  Plan:     Visit Diagnoses: Positive ANA (antinuclear antibody) - 10/22/19: ANA 1:80 NS, 01/06/20: ENA negative.  She has no clinical features of autoimmune disease.  Have advised her to contact me if she develops any new symptoms.   Primary osteoarthritis of both hands - severe.  Joint protection was discussed.  Natural anti-inflammatories were discussed.  Primary osteoarthritis of both feet-proper fitting shoes were discussed.  DDD (degenerative disc disease), cervical-she has limited range of motion with some discomfort.  Spondylolisthesis at L4-L5 level-she continues to have lower back pain with neuropathy.  She recently started gabapentin per Dr. Duanne Moron.  Osteopenia of multiple sites - She had DEXA scan in 2020 which showed osteopenia.  She has been on Prolia injections by Dr. Layne Benton.  History of gastroesophageal reflux (GERD)  Vitamin D deficiency  History of hyperlipidemia  History of hypothyroidism  History of asthma  Essential hypertension  Educated about COVID-19 virus infection-she is fully vaccinated against COVID-19.  Use of mask, social distancing and hand hygiene was discussed.  I also advised her to get a booster once available to her.  Orders: No orders of the defined types were placed in this encounter.  No orders of the defined types were placed in this encounter.    Follow-Up Instructions: Return in about 6 months (around 02/06/2021) for Osteoarthritis.   Bo Merino, MD  Note - This record has been created using Editor, commissioning.  Chart creation errors have been sought, but may not always  have been located. Such creation errors do not reflect on  the standard of medical care.,

## 2020-07-28 DIAGNOSIS — L570 Actinic keratosis: Secondary | ICD-10-CM | POA: Diagnosis not present

## 2020-07-28 DIAGNOSIS — D225 Melanocytic nevi of trunk: Secondary | ICD-10-CM | POA: Diagnosis not present

## 2020-07-28 DIAGNOSIS — D692 Other nonthrombocytopenic purpura: Secondary | ICD-10-CM | POA: Diagnosis not present

## 2020-07-28 DIAGNOSIS — Z85828 Personal history of other malignant neoplasm of skin: Secondary | ICD-10-CM | POA: Diagnosis not present

## 2020-07-28 DIAGNOSIS — M25561 Pain in right knee: Secondary | ICD-10-CM | POA: Diagnosis not present

## 2020-07-28 DIAGNOSIS — B078 Other viral warts: Secondary | ICD-10-CM | POA: Diagnosis not present

## 2020-07-28 DIAGNOSIS — D1801 Hemangioma of skin and subcutaneous tissue: Secondary | ICD-10-CM | POA: Diagnosis not present

## 2020-07-28 DIAGNOSIS — L821 Other seborrheic keratosis: Secondary | ICD-10-CM | POA: Diagnosis not present

## 2020-08-01 DIAGNOSIS — M25551 Pain in right hip: Secondary | ICD-10-CM | POA: Diagnosis not present

## 2020-08-01 DIAGNOSIS — M25561 Pain in right knee: Secondary | ICD-10-CM | POA: Diagnosis not present

## 2020-08-01 DIAGNOSIS — S72141D Displaced intertrochanteric fracture of right femur, subsequent encounter for closed fracture with routine healing: Secondary | ICD-10-CM | POA: Diagnosis not present

## 2020-08-01 DIAGNOSIS — M6281 Muscle weakness (generalized): Secondary | ICD-10-CM | POA: Diagnosis not present

## 2020-08-01 DIAGNOSIS — R269 Unspecified abnormalities of gait and mobility: Secondary | ICD-10-CM | POA: Diagnosis not present

## 2020-08-02 DIAGNOSIS — M810 Age-related osteoporosis without current pathological fracture: Secondary | ICD-10-CM | POA: Diagnosis not present

## 2020-08-02 DIAGNOSIS — M25561 Pain in right knee: Secondary | ICD-10-CM | POA: Diagnosis not present

## 2020-08-03 DIAGNOSIS — R269 Unspecified abnormalities of gait and mobility: Secondary | ICD-10-CM | POA: Diagnosis not present

## 2020-08-03 DIAGNOSIS — M6281 Muscle weakness (generalized): Secondary | ICD-10-CM | POA: Diagnosis not present

## 2020-08-03 DIAGNOSIS — S72141D Displaced intertrochanteric fracture of right femur, subsequent encounter for closed fracture with routine healing: Secondary | ICD-10-CM | POA: Diagnosis not present

## 2020-08-04 DIAGNOSIS — M25561 Pain in right knee: Secondary | ICD-10-CM | POA: Diagnosis not present

## 2020-08-08 DIAGNOSIS — R269 Unspecified abnormalities of gait and mobility: Secondary | ICD-10-CM | POA: Diagnosis not present

## 2020-08-08 DIAGNOSIS — S72141D Displaced intertrochanteric fracture of right femur, subsequent encounter for closed fracture with routine healing: Secondary | ICD-10-CM | POA: Diagnosis not present

## 2020-08-08 DIAGNOSIS — M6281 Muscle weakness (generalized): Secondary | ICD-10-CM | POA: Diagnosis not present

## 2020-08-09 ENCOUNTER — Ambulatory Visit (INDEPENDENT_AMBULATORY_CARE_PROVIDER_SITE_OTHER): Payer: Medicare Other | Admitting: Rheumatology

## 2020-08-09 ENCOUNTER — Other Ambulatory Visit: Payer: Self-pay

## 2020-08-09 ENCOUNTER — Encounter: Payer: Self-pay | Admitting: Rheumatology

## 2020-08-09 VITALS — BP 137/82 | HR 70 | Resp 13 | Ht 65.0 in | Wt 113.8 lb

## 2020-08-09 DIAGNOSIS — E559 Vitamin D deficiency, unspecified: Secondary | ICD-10-CM

## 2020-08-09 DIAGNOSIS — M19071 Primary osteoarthritis, right ankle and foot: Secondary | ICD-10-CM

## 2020-08-09 DIAGNOSIS — M19072 Primary osteoarthritis, left ankle and foot: Secondary | ICD-10-CM

## 2020-08-09 DIAGNOSIS — M4316 Spondylolisthesis, lumbar region: Secondary | ICD-10-CM

## 2020-08-09 DIAGNOSIS — Z8719 Personal history of other diseases of the digestive system: Secondary | ICD-10-CM | POA: Diagnosis not present

## 2020-08-09 DIAGNOSIS — M8589 Other specified disorders of bone density and structure, multiple sites: Secondary | ICD-10-CM

## 2020-08-09 DIAGNOSIS — M19042 Primary osteoarthritis, left hand: Secondary | ICD-10-CM

## 2020-08-09 DIAGNOSIS — R768 Other specified abnormal immunological findings in serum: Secondary | ICD-10-CM | POA: Diagnosis not present

## 2020-08-09 DIAGNOSIS — Z7189 Other specified counseling: Secondary | ICD-10-CM

## 2020-08-09 DIAGNOSIS — I10 Essential (primary) hypertension: Secondary | ICD-10-CM

## 2020-08-09 DIAGNOSIS — Z8709 Personal history of other diseases of the respiratory system: Secondary | ICD-10-CM | POA: Diagnosis not present

## 2020-08-09 DIAGNOSIS — Z8639 Personal history of other endocrine, nutritional and metabolic disease: Secondary | ICD-10-CM | POA: Diagnosis not present

## 2020-08-09 DIAGNOSIS — M19041 Primary osteoarthritis, right hand: Secondary | ICD-10-CM

## 2020-08-09 DIAGNOSIS — M5030 Other cervical disc degeneration, unspecified cervical region: Secondary | ICD-10-CM | POA: Diagnosis not present

## 2020-08-12 DIAGNOSIS — S72141D Displaced intertrochanteric fracture of right femur, subsequent encounter for closed fracture with routine healing: Secondary | ICD-10-CM | POA: Diagnosis not present

## 2020-08-12 DIAGNOSIS — M25551 Pain in right hip: Secondary | ICD-10-CM | POA: Diagnosis not present

## 2020-08-12 DIAGNOSIS — R269 Unspecified abnormalities of gait and mobility: Secondary | ICD-10-CM | POA: Diagnosis not present

## 2020-08-12 DIAGNOSIS — M6281 Muscle weakness (generalized): Secondary | ICD-10-CM | POA: Diagnosis not present

## 2020-08-15 DIAGNOSIS — M6281 Muscle weakness (generalized): Secondary | ICD-10-CM | POA: Diagnosis not present

## 2020-08-15 DIAGNOSIS — M25551 Pain in right hip: Secondary | ICD-10-CM | POA: Diagnosis not present

## 2020-08-15 DIAGNOSIS — R269 Unspecified abnormalities of gait and mobility: Secondary | ICD-10-CM | POA: Diagnosis not present

## 2020-08-15 DIAGNOSIS — S72141D Displaced intertrochanteric fracture of right femur, subsequent encounter for closed fracture with routine healing: Secondary | ICD-10-CM | POA: Diagnosis not present

## 2020-08-15 NOTE — Progress Notes (Signed)
MEDICARE ANNUAL WELLNESS VISIT AND FOLLOW UP  Assessment:   Debbie Houston was seen today for follow-up and medicare wellness.  Diagnoses and all orders for this visit:  Encounter for Medicare annual wellness exam Yearly  Essential hypertension - continue medications, DASH diet, exercise and monitor at home. Call if greater than 130/80.  -     CBC with Differential/Platelet -     COMPLETE METABOLIC PANEL WITH GFR -     Magnesium  Uncomplicated asthma, unspecified asthma severity, unspecified whether persistent Doing well on current regimen, continue with benefit Continue to monitor  Colitis Doing well Monitoring diet Continue to monitor  GERD Continue PPI/H2 blocker, diet discussed -     Magnesium  Hypothyroidism, unspecified type Will check TSH Continue current medicaitons Taking levothyroxine 125 mg 1/2 tab daily since last visit -   Osteoarthritis, unspecified osteoarthritis type, unspecified site Doing well, tylenol PRN, Dr. French Ana follows  Osteopenia of multiple sites Continue Calcium 7 Vit D Dr. Layne Benton following for prolia,  Get DEXA in 2022 Will continue to monitor  Spondylolisthesis at L4-L5 level S/P lumbar fusion.  Doing well Following with Dr Ellene Route  Mixed hyperlipidemia Continue current regimen Discussed dietary and exercise modifications  -     Lipid panel  Vitamin D deficiency -     VITAMIN D 25 Hydroxy (Vit-D Deficiency, Fractures)  Vertigo Monitor; meclizine PRN if needed  TMJ pain Avoid gum, hard foods, heat may help, follow up with dentist; did see ENT   Over 40 minutes of exam, counseling, chart review and critical decision making was performed Future Appointments  Date Time Provider [place]  11/23/2020  2:30 PM Unk Pinto, MD [facility] None  05/15/2021  2:00 PM Liane Comber, NP [facility] None  08/08/2021 10:45 AM Bo Merino, MD [facility] None     Plan:   During the course of the visit the patient was  educated and counseled about appropriate screening and preventive services including:    Pneumococcal vaccine   Prevnar 13  Influenza vaccine  Td vaccine  Screening electrocardiogram  Bone densitometry screening  Colorectal cancer screening  Diabetes screening  Glaucoma screening  Nutrition counseling   Advanced directives: requested   Subjective:  Debbie Houston is a 79 y.o. female who presents for Medicare Annual Wellness Visit and 3 month follow up.   She had fall with R hip/fremur fracture in June on vacation at the beach, had IM nail placed, now following with Dr. French Ana locally, did 2 weeks of inpatient rehab, now at home doing PT locally at ortho twice weekly, now using cane with steady improvement.   She had L4-L5 Decompression and stabilization by Dr Ellene Route which she tolerated well on 11-25-18, taking gabapentin for 300 mg just in the evening, also 2 tab extra strength tylenol in the evening which works well for her.   She had a DEXA 12/2018 that showed left femur with T -2.3, she can not tolerate fosamax, now following with Dr. Layne Benton on prolia, had recently and tolerating well.   She has had vertigo, TMJ pain, some mildly asymmetric hearing, saw ENT and now has hearing aids.   BMI is Body mass index is 18.87 kg/m., she has been working on diet and exercise. Wt Readings from Last 3 Encounters:  08/17/20 113 lb 6.4 oz (51.4 kg)  08/09/20 113 lb 12.8 oz (51.6 kg)  07/13/20 110 lb (49.9 kg)    Her blood pressure has been controlled at home, today their BP is BP: 126/78  She does workout. She denies chest pain, shortness of breath, dizziness.   She is on cholesterol medication (simvastatin 20 mg, cholestipol 2 g daily) and denies myalgias. Her cholesterol is at goal. The cholesterol last visit was:   Lab Results  Component Value Date   CHOL 178 02/03/2020   HDL 60 02/03/2020   LDLCALC 98 02/03/2020   TRIG 107 02/03/2020   CHOLHDL 3.0 02/03/2020   . She has  been working on diet and exercise. and denies Last A1C in the office was:  Lab Results  Component Value Date   HGBA1C 5.4 02/03/2020   Last GFR: Lab Results  Component Value Date   GFRNONAA 65 05/25/2020   She is on thyroid medication. Her medication was changed last visit, patient never scheduled follow up due to hip fracture, did reduce from 125 mg alternating whole and half, to half tab daily  Lab Results  Component Value Date   TSH 0.03 (L) 04/12/2020   Patient is on Vitamin D supplement.   Lab Results  Component Value Date   VD25OH 56 02/03/2020      Medication Review: Current Outpatient Medications on File Prior to Visit  Medication Sig Dispense Refill  . acetaminophen (TYLENOL) 500 MG tablet Take 500 mg by mouth at bedtime as needed for moderate pain or headache.     . Ascorbic Acid (VITAMIN C) 1000 MG tablet Take 1,000 mg by mouth daily.     Marland Kitchen aspirin EC 81 MG tablet Take 81 mg by mouth daily.     . bisoprolol-hydrochlorothiazide (ZIAC) 2.5-6.25 MG tablet Take 1 tablet Daily for BP (Patient taking differently: Take 1 tablet by mouth daily. ) 90 tablet 3  . budesonide (ENTOCORT EC) 3 MG 24 hr capsule Take 6 mg by mouth daily.     . Calcium Carbonate-Vitamin D (CALCIUM 600+D HIGH POTENCY) 600-400 MG-UNIT tablet Take 1 tablet by mouth daily.    . Cinnamon 500 MG TABS Take 500 mg by mouth daily.     . Coenzyme Q10 (COQ10) 200 MG CAPS Take 200 mg by mouth daily.    . colestipol (COLESTID) 1 g tablet 2 g PO every day - take prior to lunch, at least 1 hour separate from other medications    . gabapentin (NEURONTIN) 300 MG capsule Take 300 mg by mouth at bedtime.    Marland Kitchen levothyroxine (SYNTHROID) 125 MCG tablet Take 1 tablet daily on an empty stomach with only water for 30 minutes & no Antacid meds, Calcium or Magnesium for 4 hours & avoid Biotin (Patient taking differently: Take 62.5-125 mcg by mouth See admin instructions. Take 1/2 tablet daily) 90 tablet 3  . loratadine (CLARITIN)  10 MG tablet Take 10 mg by mouth daily.    . Multiple Vitamins-Minerals (MULTIVITAMIN WITH MINERALS) tablet Take 1 tablet by mouth daily.    . Multiple Vitamins-Minerals (ZINC PO) Take by mouth daily.    . Omega-3 Fatty Acids (FISH OIL) 1200 MG CAPS Take 1,200 mg by mouth daily.     Marland Kitchen OVER THE COUNTER MEDICATION Take 1 tablet by mouth daily. Vein Health supplement    . pantoprazole (PROTONIX) 40 MG tablet Take 1 tablet  2 x /day  for Heartburn & Indigestion 180 tablet 0  . Polyethyl Glycol-Propyl Glycol (SYSTANE) 0.4-0.3 % SOLN Place 1 drop into both eyes at bedtime.     . Probiotic Product (PROBIOTIC PO) Take 1 capsule by mouth daily.    Marland Kitchen PROLIA 60 MG/ML SOSY injection Inject  60 mg into the skin every 6 (six) months.    . simvastatin (ZOCOR) 20 MG tablet Take 1 tablet at Bedtime for Cholesterol 90 tablet 1   No current facility-administered medications on file prior to visit.    Allergies  Allergen Reactions  . Fosamax [Alendronate Sodium] Other (See Comments)    GI upset   . Singulair [Montelukast Sodium] Other (See Comments)    Makes pt jittery  . Wellbutrin [Bupropion] Hives  . Sulfa Antibiotics Rash    Current Problems (verified) Patient Active Problem List   Diagnosis Date Noted  . Senile purpura ([place]) 08/17/2020  . Bilateral temporomandibular joint pain 06/15/2020  . Vertigo 03/25/2019  . Spondylolisthesis at L4-L5 level 11/25/2018  . Encounter for Medicare annual wellness exam 07/07/2015  . Colitis 12/21/2014  . Medication management 04/20/2014  . Essential hypertension   . Hyperlipidemia   . Hypothyroid   . GERD   . Asthma   . DJD (degenerative joint disease)   . Vitamin D deficiency   . Osteopenia   . Allergy     Screening Tests Immunization History  Administered Date(s) Administered  . Influenza, High Dose Seasonal PF 07/23/2014, 08/23/2015, 07/10/2016, 08/27/2017, 08/22/2018, 08/25/2019  . PFIZER SARS-COV-2 Vaccination 12/10/2019, 12/31/2019, 08/16/2020   . Pneumococcal Conjugate-13 05/05/2015  . Pneumococcal-Unspecified 08/22/2011  . Tdap 08/22/2011  . Zoster 06/20/2006  . Zoster Recombinat (Shingrix) 09/13/2017, 01/01/2018    Preventative Care: Last colonoscopy: 2015 Upper GI: 2017  Last mammogram: 12/2019, neg, Hx Breast Cx. Last pap smear/pelvic exam: 2012 DEXA: 12/2018, fem T-2.3, stable, planned in 2022 by Dr. Layne Benton  Prior vaccinations: TD or Tdap: 2012, DUE 2022  Influenza: 2020, plans to get in 2 weeks at [facility] - declines today, just had covid booster Pneumococcal: 2012 Prevnar13: 2016 Shingrix: 2/2, 2019 Covid 19: 3/3, 2021, pfizer   Names of Other Physician/Practitioners you currently use: 1. [facility] here for primary care 2.Eye Exam 2021 3. Dentist 2021, q70m  Patient Care Team: MUnk Pinto MD as PCP - General (Internal Medicine) SJerrell Belfast MD as Consulting Physician (Otolaryngology) EKristeen Miss MD as Consulting Physician (Neurosurgery) MClarene Essex MD as Consulting Physician (Gastroenterology)  SURGICAL HISTORY She  has a past surgical history that includes Mohs surgery (2005); Tonsillectomy (age 79; Dilation and curettage of uterus; Dilatation & curettage/hysteroscopy with myosure (N/A, 10/27/2014); Back surgery (2020); Hip surgery (Right, 04/2020); and Cataract extraction, bilateral (Bilateral, 2020). FAMILY HISTORY Her family history includes Autism in her son; Cancer in her father, maternal grandmother, mother, and sister; Colitis in her son; Diabetes in her brother and maternal grandfather; Healthy in her son; Hyperlipidemia in her brother, father, maternal grandfather, maternal grandmother, mother, paternal grandfather, paternal grandmother, and sister; Hypertension in her brother and mother; Stroke in her maternal grandfather. SOCIAL HISTORY She  reports that she has quit smoking. Her smoking use included cigarettes. She quit after 15.00 years of  use. She has never used smokeless tobacco. She reports current alcohol use. She reports that she does not use drugs.   MEDICARE WELLNESS OBJECTIVES: Physical activity: Current Exercise Habits: Structured exercise class, Type of exercise: strength training/weights;walking;Other - see comments (water aerobics, PT), Time (Minutes): 30, Frequency (Times/Week): 4, Weekly Exercise (Minutes/Week): 120, Intensity: Mild, Exercise limited by: orthopedic condition(s) Cardiac risk factors: Cardiac Risk Factors include: advanced age (>558m, >6>52omen);sedentary lifestyle;hypertension;dyslipidemia Depression/mood screen:   [facility] 08/17/2020  Decreased Interest 0  Down, Depressed, Hopeless 0  PHQ - 2 Score 0  ADLs:  In your present state of health, do you have any difficulty performing the following activities: 08/17/2020 02/03/2020  Hearing? N Y  Comment - bilat hearing aids  Vision? N N  Difficulty concentrating or making decisions? N N  Walking or climbing stairs? N N  Dressing or bathing? N N  Doing errands, shopping? N -  Some recent data might be hidden     Cognitive Testing  Alert? Yes  Normal Appearance?Yes  Oriented to person? Yes  Place? Yes   Time? Yes  Recall of three objects?  Yes  Can perform simple calculations? Yes  Displays appropriate judgment?Yes  Can read the correct time from a watch face?Yes  EOL planning: Does Patient Have a Medical Advance Directive?: Yes Type of Advance Directive: Healthcare Power of Attorney, Living will Does patient want to make changes to medical advance directive?: No - Patient declined Copy of [place] in Chart?: Yes - validated most recent copy scanned in chart (See row information)  Review of Systems  Constitutional: Negative for chills, diaphoresis, fever, malaise/fatigue and weight loss.  HENT: Negative for congestion, ear discharge, ear pain, hearing loss, nosebleeds, sinus pain, sore throat and  tinnitus.   Eyes: Negative for blurred vision, double vision, photophobia, pain, discharge and redness.  Respiratory: Negative for cough, hemoptysis, sputum production, shortness of breath, wheezing and stridor.   Cardiovascular: Negative for chest pain, palpitations, orthopnea, claudication, leg swelling and PND.  Gastrointestinal: Negative for abdominal pain, blood in stool, constipation, diarrhea, heartburn, melena, nausea and vomiting.  Genitourinary: Negative for dysuria, flank pain, frequency, hematuria and urgency.  Musculoskeletal: Positive for back pain. Negative for falls, joint pain, myalgias and neck pain.       S/P lumbar fusion.  Skin: Negative for itching and rash.  Neurological: Negative for dizziness, tingling, tremors, sensory change, speech change, focal weakness, seizures, loss of consciousness, weakness and headaches.  Endo/Heme/Allergies: Negative for environmental allergies and polydipsia. Does not bruise/bleed easily.  Psychiatric/Behavioral: Negative for depression, hallucinations, memory loss, substance abuse and suicidal ideas. The patient is not nervous/anxious and does not have insomnia.      Objective:     Today's Vitals   08/17/20 0924  BP: 126/78  Pulse: 63  Temp: 97.9 F (36.6 C)  SpO2: 96%  Weight: 113 lb 6.4 oz (51.4 kg)  PainSc: 4   PainLoc: Hip   Body mass index is 18.87 kg/m.  General appearance: alert, no distress, WD/WN, female HEENT: normocephalic, sclerae anicteric, TMs pearly, nares patent, no discharge or erythema, pharynx normal Oral cavity: MMM, no lesions Neck: supple, no lymphadenopathy, no thyromegaly, no masses Heart: RRR, normal S1, S2, no murmurs Lungs: CTA bilaterally, no wheezes, rhonchi, or rales Abdomen: +bs, soft, non tender, non distended, no masses, no hepatomegaly, no splenomegaly Musculoskeletal: nontender, no swelling, no obvious deformity Extremities: no edema, no cyanosis, no clubbing Pulses: 2+ symmetric, upper  and lower extremities, normal cap refill Neurological: alert, oriented x 3, CN2-12 intact, strength normal upper extremities and lower extremities, sensation normal throughout, DTRs 2+ throughout, no cerebellar signs, gait slow steady with cane  Psychiatric: normal affect, behavior normal, pleasant  Derm: no rashes, concerning lesions; has scattered small ecchymosis to bil upper extremities  Medicare Attestation I have personally reviewed: The patient's medical and social history Their use of alcohol, tobacco or illicit drugs Their current medications and supplements The patient's functional ability including ADLs,fall risks, home safety risks, cognitive, and hearing and visual impairment Diet and physical activities Evidence  for depression or mood disorders  The patient's weight, height, BMI, and visual acuity have been recorded in the chart.  I have made referrals, counseling, and provided education to the patient based on review of the above and I have provided the patient with a written personalized care plan for preventive services.     Izora Ribas, NP   08/17/2020

## 2020-08-16 DIAGNOSIS — Z23 Encounter for immunization: Secondary | ICD-10-CM | POA: Diagnosis not present

## 2020-08-17 ENCOUNTER — Other Ambulatory Visit: Payer: Self-pay

## 2020-08-17 ENCOUNTER — Ambulatory Visit (INDEPENDENT_AMBULATORY_CARE_PROVIDER_SITE_OTHER): Payer: Medicare Other | Admitting: Adult Health

## 2020-08-17 ENCOUNTER — Encounter: Payer: Self-pay | Admitting: Adult Health

## 2020-08-17 VITALS — BP 126/78 | HR 63 | Temp 97.9°F | Wt 113.4 lb

## 2020-08-17 DIAGNOSIS — I10 Essential (primary) hypertension: Secondary | ICD-10-CM | POA: Diagnosis not present

## 2020-08-17 DIAGNOSIS — M8589 Other specified disorders of bone density and structure, multiple sites: Secondary | ICD-10-CM

## 2020-08-17 DIAGNOSIS — M1990 Unspecified osteoarthritis, unspecified site: Secondary | ICD-10-CM | POA: Diagnosis not present

## 2020-08-17 DIAGNOSIS — R6889 Other general symptoms and signs: Secondary | ICD-10-CM

## 2020-08-17 DIAGNOSIS — K2100 Gastro-esophageal reflux disease with esophagitis, without bleeding: Secondary | ICD-10-CM | POA: Diagnosis not present

## 2020-08-17 DIAGNOSIS — R42 Dizziness and giddiness: Secondary | ICD-10-CM

## 2020-08-17 DIAGNOSIS — E039 Hypothyroidism, unspecified: Secondary | ICD-10-CM | POA: Diagnosis not present

## 2020-08-17 DIAGNOSIS — Z79899 Other long term (current) drug therapy: Secondary | ICD-10-CM | POA: Diagnosis not present

## 2020-08-17 DIAGNOSIS — Z8781 Personal history of (healed) traumatic fracture: Secondary | ICD-10-CM

## 2020-08-17 DIAGNOSIS — E559 Vitamin D deficiency, unspecified: Secondary | ICD-10-CM

## 2020-08-17 DIAGNOSIS — T7840XA Allergy, unspecified, initial encounter: Secondary | ICD-10-CM

## 2020-08-17 DIAGNOSIS — J45909 Unspecified asthma, uncomplicated: Secondary | ICD-10-CM

## 2020-08-17 DIAGNOSIS — K529 Noninfective gastroenteritis and colitis, unspecified: Secondary | ICD-10-CM

## 2020-08-17 DIAGNOSIS — E782 Mixed hyperlipidemia: Secondary | ICD-10-CM

## 2020-08-17 DIAGNOSIS — D692 Other nonthrombocytopenic purpura: Secondary | ICD-10-CM | POA: Insufficient documentation

## 2020-08-17 DIAGNOSIS — Z0001 Encounter for general adult medical examination with abnormal findings: Secondary | ICD-10-CM

## 2020-08-17 DIAGNOSIS — Z0000 Encounter for general adult medical examination without abnormal findings: Secondary | ICD-10-CM

## 2020-08-17 DIAGNOSIS — M26623 Arthralgia of bilateral temporomandibular joint: Secondary | ICD-10-CM

## 2020-08-17 HISTORY — DX: Personal history of (healed) traumatic fracture: Z87.81

## 2020-08-17 NOTE — Patient Instructions (Addendum)
Debbie Houston , Thank you for taking time to come for your Medicare Wellness Visit. I appreciate your ongoing commitment to your health goals. Please review the following plan we discussed and let me know if I can assist you in the future.   These are the goals we discussed: Goals    . Exercise 150 min/wk Moderate Activity       This is a list of the screening recommended for you and due dates:  Health Maintenance  Topic Date Due  . Flu Shot  08/19/2020*  . Mammogram  12/28/2020  . Tetanus Vaccine  08/21/2021  . Colon Cancer Screening  05/12/2024  . DEXA scan (bone density measurement)  Completed  . COVID-19 Vaccine  Completed  .  Hepatitis C: One time screening is recommended by Center for Disease Control  (CDC) for  adults born from [birth year] through 1965.   Completed  . Pneumonia vaccines  Completed  *Topic was postponed. The date shown is not the original due date.       Exercising to Stay Healthy To become healthy and stay healthy, it is recommended that you do moderate-intensity and vigorous-intensity exercise. You can tell that you are exercising at a moderate intensity if your heart starts beating faster and you start breathing faster but can still hold a conversation. You can tell that you are exercising at a vigorous intensity if you are breathing much harder and faster and cannot hold a conversation while exercising. Exercising regularly is important. It has many health benefits, such as:  Improving overall fitness, flexibility, and endurance.  Increasing bone density.  Helping with weight control.  Decreasing body fat.  Increasing muscle strength.  Reducing stress and tension.  Improving overall health. How often should I exercise? Choose an activity that you enjoy, and set realistic goals. Your health care provider can help you make an activity plan that works for you. Exercise regularly as told by your health care provider. This may include:  Doing strength  training two times a week, such as: ? Lifting weights. ? Using resistance bands. ? Push-ups. ? Sit-ups. ? Yoga.  Doing a certain intensity of exercise for a given amount of time. Choose from these options: ? A total of 150 minutes of moderate-intensity exercise every week. ? A total of 75 minutes of vigorous-intensity exercise every week. ? A mix of moderate-intensity and vigorous-intensity exercise every week. Children, pregnant women, people who have not exercised regularly, people who are overweight, and older adults may need to talk with a health care provider about what activities are safe to do. If you have a medical condition, be sure to talk with your health care provider before you start a new exercise program. What are some exercise ideas? Moderate-intensity exercise ideas include:  Walking 1 mile (1.6 km) in about 15 minutes.  Biking.  Hiking.  Golfing.  Dancing.  Water aerobics. Vigorous-intensity exercise ideas include:  Walking 4.5 miles (7.2 km) or more in about 1 hour.  Jogging or running 5 miles (8 km) in about 1 hour.  Biking 10 miles (16.1 km) or more in about 1 hour.  Lap swimming.  Roller-skating or in-line skating.  Cross-country skiing.  Vigorous competitive sports, such as football, basketball, and soccer.  Jumping rope.  Aerobic dancing. What are some everyday activities that can help me to get exercise?  [place] work, such as: ? Pushing a Conservation officer, nature. ? Raking and bagging leaves.  Washing your car.  Pushing a stroller.  Shoveling  snow.  Gardening.  Washing windows or floors. How can I be more active in my day-to-day activities?  Use stairs instead of an elevator.  Take a walk during your lunch break.  If you drive, park your car farther away from your work or school.  If you take public transportation, get off one stop early and walk the rest of the way.  Stand up or walk around during all of your indoor phone calls.  Get  up, stretch, and walk around every 30 minutes throughout the day.  Enjoy exercise with a friend. Support to continue exercising will help you keep a regular routine of activity. What guidelines can I follow while exercising?  Before you start a new exercise program, talk with your health care provider.  Do not exercise so much that you hurt yourself, feel dizzy, or get very short of breath.  Wear comfortable clothes and wear shoes with good support.  Drink plenty of water while you exercise to prevent dehydration or heat stroke.  Work out until your breathing and your heartbeat get faster. Where to find more information  U.S. Department of Health and Human Services: [employer]at  Centers for Disease Control and Prevention (CDC): http://www.wolf.info/ Summary  Exercising regularly is important. It will improve your overall fitness, flexibility, and endurance.  Regular exercise also will improve your overall health. It can help you control your weight, reduce stress, and improve your bone density.  Do not exercise so much that you hurt yourself, feel dizzy, or get very short of breath.  Before you start a new exercise program, talk with your health care provider. This information is not intended to replace advice given to you by your health care provider. Make sure you discuss any questions you have with your health care provider. Document Revised: 10/18/2017 Document Reviewed: 09/26/2017 Elsevier Patient Education  2020 [employer].

## 2020-08-18 ENCOUNTER — Other Ambulatory Visit: Payer: Self-pay | Admitting: Adult Health

## 2020-08-18 DIAGNOSIS — E039 Hypothyroidism, unspecified: Secondary | ICD-10-CM

## 2020-08-18 LAB — COMPLETE METABOLIC PANEL WITH GFR
AG Ratio: 2.5 (calc) (ref 1.0–2.5)
ALT: 14 U/L (ref 6–29)
AST: 19 U/L (ref 10–35)
Albumin: 4.3 g/dL (ref 3.6–5.1)
Alkaline phosphatase (APISO): 58 U/L (ref 37–153)
BUN: 10 mg/dL (ref 7–25)
CO2: 28 mmol/L (ref 20–32)
Calcium: 9.5 mg/dL (ref 8.6–10.4)
Chloride: 101 mmol/L (ref 98–110)
Creat: 0.74 mg/dL (ref 0.60–0.93)
GFR, Est African American: 89 mL/min/{1.73_m2} (ref >=60)
GFR, Est Non African American: 77 mL/min/{1.73_m2} (ref >=60)
Globulin: 1.7 g/dL (calc) — ABNORMAL LOW (ref 1.9–3.7)
Glucose, Bld: 83 mg/dL (ref 65–99)
Potassium: 4.3 mmol/L (ref 3.5–5.3)
Sodium: 137 mmol/L (ref 135–146)
Total Bilirubin: 0.4 mg/dL (ref 0.2–1.2)
Total Protein: 6 g/dL — ABNORMAL LOW (ref 6.1–8.1)

## 2020-08-18 LAB — CBC WITH DIFFERENTIAL/PLATELET
Absolute Monocytes: 627 cells/uL (ref 200–950)
Basophils Absolute: 40 cells/uL (ref 0–200)
Basophils Relative: 0.6 %
Eosinophils Absolute: 224 cells/uL (ref 15–500)
Eosinophils Relative: 3.4 %
HCT: 39.2 % (ref 35.0–45.0)
Hemoglobin: 13 g/dL (ref 11.7–15.5)
Lymphs Abs: 1003 cells/uL (ref 850–3900)
MCH: 32.3 pg (ref 27.0–33.0)
MCHC: 33.2 g/dL (ref 32.0–36.0)
MCV: 97.3 fL (ref 80.0–100.0)
MPV: 10.4 fL (ref 7.5–12.5)
Monocytes Relative: 9.5 %
Neutro Abs: 4706 cells/uL (ref 1500–7800)
Neutrophils Relative %: 71.3 %
Platelets: 239 10*3/uL (ref 140–400)
RBC: 4.03 10*6/uL (ref 3.80–5.10)
RDW: 13 % (ref 11.0–15.0)
Total Lymphocyte: 15.2 %
WBC: 6.6 10*3/uL (ref 3.8–10.8)

## 2020-08-18 LAB — LIPID PANEL
Cholesterol: 179 mg/dL (ref <200)
HDL: 65 mg/dL (ref >=50)
LDL Cholesterol (Calc): 99 mg/dL (calc)
Non-HDL Cholesterol (Calc): 114 mg/dL (calc) (ref <130)
Total CHOL/HDL Ratio: 2.8 (calc) (ref <5.0)
Triglycerides: 64 mg/dL (ref <150)

## 2020-08-18 LAB — TSH: TSH: 9.06 mIU/L — ABNORMAL HIGH (ref 0.40–4.50)

## 2020-08-18 LAB — MAGNESIUM: Magnesium: 2 mg/dL (ref 1.5–2.5)

## 2020-08-18 MED ORDER — LEVOTHYROXINE SODIUM 125 MCG PO TABS: ORAL_TABLET | ORAL | 3 refills | Status: DC

## 2020-08-19 DIAGNOSIS — M6281 Muscle weakness (generalized): Secondary | ICD-10-CM | POA: Diagnosis not present

## 2020-08-19 DIAGNOSIS — M25551 Pain in right hip: Secondary | ICD-10-CM | POA: Diagnosis not present

## 2020-08-19 DIAGNOSIS — R269 Unspecified abnormalities of gait and mobility: Secondary | ICD-10-CM | POA: Diagnosis not present

## 2020-08-19 DIAGNOSIS — S72141D Displaced intertrochanteric fracture of right femur, subsequent encounter for closed fracture with routine healing: Secondary | ICD-10-CM | POA: Diagnosis not present

## 2020-08-24 DIAGNOSIS — M6281 Muscle weakness (generalized): Secondary | ICD-10-CM | POA: Diagnosis not present

## 2020-08-24 DIAGNOSIS — R269 Unspecified abnormalities of gait and mobility: Secondary | ICD-10-CM | POA: Diagnosis not present

## 2020-08-24 DIAGNOSIS — S72141D Displaced intertrochanteric fracture of right femur, subsequent encounter for closed fracture with routine healing: Secondary | ICD-10-CM | POA: Diagnosis not present

## 2020-08-24 DIAGNOSIS — M25551 Pain in right hip: Secondary | ICD-10-CM | POA: Diagnosis not present

## 2020-08-29 DIAGNOSIS — R269 Unspecified abnormalities of gait and mobility: Secondary | ICD-10-CM | POA: Diagnosis not present

## 2020-08-29 DIAGNOSIS — M6281 Muscle weakness (generalized): Secondary | ICD-10-CM | POA: Diagnosis not present

## 2020-08-29 DIAGNOSIS — S72141D Displaced intertrochanteric fracture of right femur, subsequent encounter for closed fracture with routine healing: Secondary | ICD-10-CM | POA: Diagnosis not present

## 2020-08-29 DIAGNOSIS — M25551 Pain in right hip: Secondary | ICD-10-CM | POA: Diagnosis not present

## 2020-08-30 ENCOUNTER — Other Ambulatory Visit: Payer: Self-pay

## 2020-08-30 ENCOUNTER — Encounter: Payer: Self-pay | Admitting: Adult Health Nurse Practitioner

## 2020-08-30 ENCOUNTER — Ambulatory Visit (INDEPENDENT_AMBULATORY_CARE_PROVIDER_SITE_OTHER): Payer: Medicare Other | Admitting: Adult Health Nurse Practitioner

## 2020-08-30 VITALS — BP 120/84 | Temp 97.7°F | Wt 113.4 lb

## 2020-08-30 DIAGNOSIS — R300 Dysuria: Secondary | ICD-10-CM

## 2020-08-30 DIAGNOSIS — Z23 Encounter for immunization: Secondary | ICD-10-CM

## 2020-08-30 DIAGNOSIS — N3000 Acute cystitis without hematuria: Secondary | ICD-10-CM | POA: Diagnosis not present

## 2020-08-30 MED ORDER — NITROFURANTOIN MONOHYD MACRO 100 MG PO CAPS: 100.0000 mg | ORAL_CAPSULE | Freq: Two times a day (BID) | ORAL | 0 refills | Status: DC

## 2020-08-30 NOTE — Progress Notes (Signed)
ACUTE, URINARY SYSMPTOMS  Assessment/Plan:   Debbie Houston was seen today for annual exam.  Diagnoses and all orders for this visit:  Needs flu shot -     Flu vaccine HIGH DOSE PF Received today  Acute cystitis without hematuria Increase water intake May use OTC AZO if needed -     nitrofurantoin, macrocrystal-monohydrate, (MACROBID) 100 MG capsule; Take 1 capsule (100 mg total) by mouth 2 (two) times daily.  Dysuria -     Urinalysis w microscopic + reflex cultur -     Urine Culture -     REFLEXIVE URINE CULTURE      Further disposition pending results if labs check today. Discussed med's effects and SE's.   Over 20 minutes of face to face interview, exam, counseling, chart review, and critical decision making was performed.     HPI: complains of UTI symptoms of dysuria and notices it more at night.  She now has to get up 4-5 times a night and is experiencing dysuria. . She did use one of the home strips that showed positive for UTI.  Onset 4 days ago, progressively worse associated with dysuria and small volume voiding with increased frequency denies hematuria, flank pain or fever The patient has a history of prior UTI and last was on 07/01/20  She knows that she need to drink more water, she drink 3-4 glasses of water.  Denies any abdominal pain, nausea, vomiting, lower extremity edema, chest pain, palpitations, shortness of breath or fevers.  PMH: reviewed  ROS:  Gen.: No unexpected weight change, no night sweats Lungs: No cough or shortness of breath Cardiovascular: No palpitations or chest pain  Physical Exam: BP 120/84   Temp 97.7 F (36.5 C)   Wt 113 lb 6.4 oz (51.4 kg)   BMI 18.87 kg/m  General: No acute distress Lungs: Clear to auscultation Cardiovascular: Regular rate rhythm, no edema Abdomen: Mild to moderate discomfort of her suprapubic region, no flank tenderness to palpation  Lab Results  Component Value Date   WBC 6.6 08/17/2020   HGB 13.0  08/17/2020   HCT 39.2 08/17/2020   PLT 239 08/17/2020   GLUCOSE 83 08/17/2020   CHOL 179 08/17/2020   TRIG 64 08/17/2020   HDL 65 08/17/2020   LDLCALC 99 08/17/2020   ALT 14 08/17/2020   AST 19 08/17/2020   NA 137 08/17/2020   K 4.3 08/17/2020   CL 101 08/17/2020   CREATININE 0.74 08/17/2020   BUN 10 08/17/2020   CO2 28 08/17/2020   TSH 9.06 (H) 08/17/2020   HGBA1C 5.4 02/03/2020   MICROALBUR 0.3 04/07/2019

## 2020-09-01 DIAGNOSIS — M25561 Pain in right knee: Secondary | ICD-10-CM | POA: Diagnosis not present

## 2020-09-01 DIAGNOSIS — M25551 Pain in right hip: Secondary | ICD-10-CM | POA: Diagnosis not present

## 2020-09-02 ENCOUNTER — Other Ambulatory Visit: Payer: Self-pay | Admitting: Adult Health Nurse Practitioner

## 2020-09-02 DIAGNOSIS — R269 Unspecified abnormalities of gait and mobility: Secondary | ICD-10-CM | POA: Diagnosis not present

## 2020-09-02 DIAGNOSIS — S72141D Displaced intertrochanteric fracture of right femur, subsequent encounter for closed fracture with routine healing: Secondary | ICD-10-CM | POA: Diagnosis not present

## 2020-09-02 DIAGNOSIS — B9689 Other specified bacterial agents as the cause of diseases classified elsewhere: Secondary | ICD-10-CM

## 2020-09-02 DIAGNOSIS — M25551 Pain in right hip: Secondary | ICD-10-CM | POA: Diagnosis not present

## 2020-09-02 DIAGNOSIS — M6281 Muscle weakness (generalized): Secondary | ICD-10-CM | POA: Diagnosis not present

## 2020-09-02 DIAGNOSIS — B373 Candidiasis of vulva and vagina: Secondary | ICD-10-CM

## 2020-09-02 DIAGNOSIS — N390 Urinary tract infection, site not specified: Secondary | ICD-10-CM

## 2020-09-02 DIAGNOSIS — B3731 Acute candidiasis of vulva and vagina: Secondary | ICD-10-CM

## 2020-09-02 LAB — URINALYSIS W MICROSCOPIC + REFLEX CULTURE
Bacteria, UA: NONE SEEN /HPF
Bilirubin Urine: NEGATIVE
Glucose, UA: NEGATIVE
Hyaline Cast: NONE SEEN /LPF
Ketones, ur: NEGATIVE
Nitrites, Initial: NEGATIVE
Protein, ur: NEGATIVE
Specific Gravity, Urine: 1.008 (ref 1.001–1.03)
Squamous Epithelial / HPF: NONE SEEN /HPF (ref <=5)
WBC, UA: >=60 /HPF — AB (ref 0–5)
pH: 7.5 (ref 5.0–8.0)

## 2020-09-02 LAB — URINE CULTURE
MICRO NUMBER:: 11062956
SPECIMEN QUALITY:: ADEQUATE

## 2020-09-02 LAB — CULTURE INDICATED

## 2020-09-02 MED ORDER — AMOXICILLIN-POT CLAVULANATE 875-125 MG PO TABS: 1.0000 | ORAL_TABLET | Freq: Two times a day (BID) | ORAL | 0 refills | Status: DC

## 2020-09-02 MED ORDER — FLUCONAZOLE 150 MG PO TABS: ORAL_TABLET | ORAL | 0 refills | Status: DC

## 2020-09-05 DIAGNOSIS — H5202 Hypermetropia, left eye: Secondary | ICD-10-CM | POA: Diagnosis not present

## 2020-09-05 DIAGNOSIS — H35033 Hypertensive retinopathy, bilateral: Secondary | ICD-10-CM | POA: Diagnosis not present

## 2020-09-05 DIAGNOSIS — Z961 Presence of intraocular lens: Secondary | ICD-10-CM | POA: Diagnosis not present

## 2020-09-05 DIAGNOSIS — H35039 Hypertensive retinopathy, unspecified eye: Secondary | ICD-10-CM | POA: Diagnosis not present

## 2020-09-05 DIAGNOSIS — I10 Essential (primary) hypertension: Secondary | ICD-10-CM | POA: Diagnosis not present

## 2020-09-05 DIAGNOSIS — H524 Presbyopia: Secondary | ICD-10-CM | POA: Diagnosis not present

## 2020-09-05 DIAGNOSIS — H52223 Regular astigmatism, bilateral: Secondary | ICD-10-CM | POA: Diagnosis not present

## 2020-09-05 DIAGNOSIS — Z9849 Cataract extraction status, unspecified eye: Secondary | ICD-10-CM | POA: Diagnosis not present

## 2020-09-07 ENCOUNTER — Ambulatory Visit: Admit: 2020-09-07 | Discharge: 2020-09-08 | Payer: MEDICARE

## 2020-09-07 DIAGNOSIS — R9341 Abnormal radiologic findings on diagnostic imaging of renal pelvis, ureter, or bladder: Secondary | ICD-10-CM | POA: Diagnosis not present

## 2020-09-07 DIAGNOSIS — K52839 Microscopic colitis, unspecified: Secondary | ICD-10-CM | POA: Diagnosis not present

## 2020-09-07 DIAGNOSIS — R109 Unspecified abdominal pain: Secondary | ICD-10-CM | POA: Diagnosis not present

## 2020-09-07 DIAGNOSIS — N200 Calculus of kidney: Secondary | ICD-10-CM | POA: Diagnosis not present

## 2020-09-15 DIAGNOSIS — M25551 Pain in right hip: Secondary | ICD-10-CM | POA: Diagnosis not present

## 2020-09-19 NOTE — Progress Notes (Signed)
Assessment and Plan:  Diagnoses and all orders for this visit:  Hypothyroidism, unspecified type Taking medication appropriately;  continue medications the same pending lab results reminded to take on an empty stomach 30-104mns before food.  -     TSH  Bilateral nephrolithiasis [facility] CT [facility] reviewed in care everywhere Denies sx, recent renal functions stable; will refer on to urology for review and recommendations -     Ambulatory referral to Urology  Further disposition pending results of labs. Discussed med's effects and SE's.   Over 20 minutes of exam, counseling, chart review, and critical decision making was performed.   Future Appointments  Date Time Provider [place] 11/23/2020  2:30 PM MUnk Pinto MD [facility] None  05/15/2021  2:00 PM CLiane Comber NP [facility] None  08/08/2021 10:45 AM DBo Merino MD [facility] None  08/21/2021  9:30 AM CLiane Comber NP [facility] None    ------------------------------------------------------------------------------------------------------------------   HPI BP 130/78   Pulse 63   Wt 115 lb (52.2 kg)   SpO2 98%   BMI 19.14 kg/m   79 y.o.female presents for 4 week follow up on thyroid.   She also requests urology referral today- [facility] [place] [facility] bilateral nephrolithiasis with questionable hydronephrosis. She denies any concerning sx including pain, hematuria. Recent renal functions have been stable on review.   She is on thyroid medication for hypothyroid since 1990. At last check she was found to be out of range, was hyper then hypothyroid, does recall a few days where neck/thyroid felt full and tender, has since resolved, now taking whole tab Tues/Thursday and 1/2 tab all other days. Takes on fasting stomach 30 min prior with water only. She was on biotin but has stopped several months ago.  Lab Results   Component Value Date   TSH 9.06 (H) 08/17/2020   Past Medical History:  Diagnosis Date  . Arthritis   . Benign labile hypertension   . Cancer ([place]    skin cancer on face  . Colitis   . DJD (degenerative joint disease)   . Endometrial polyp   . GERD (gastroesophageal reflux disease)   . History of basal cell carcinoma excision   . History of esophagitis   . HOH (hard of hearing)    right ear better than left  -- wears no aides  . Hyperlipidemia   . Hypothyroidism   . Osteopenia   . Prediabetes   . Vitamin D deficiency   . Wears glasses      Allergies  Allergen Reactions  . Fosamax [Alendronate Sodium] Other (See Comments)    GI upset   . Singulair [Montelukast Sodium] Other (See Comments)    Makes pt jittery  . Wellbutrin [Bupropion] Hives  . Sulfa Antibiotics Rash    Current Outpatient Medications on File Prior to Visit  Medication Sig  . acetaminophen (TYLENOL) 500 MG tablet Take 500 mg by mouth at bedtime as needed for moderate pain or headache.   . Ascorbic Acid (VITAMIN C) 1000 MG tablet Take 1,000 mg by mouth daily.   .Marland Kitchenaspirin EC 81 MG tablet Take 81 mg by mouth daily.   . bisoprolol-hydrochlorothiazide (ZIAC) 2.5-6.25 MG tablet Take 1 tablet Daily for BP (Patient taking differently: Take 1 tablet by mouth daily. )  . budesonide (ENTOCORT EC) 3 MG 24 hr capsule Take 6 mg by mouth daily.   . Calcium Carbonate-Vitamin D (CALCIUM 600+D  HIGH POTENCY) 600-400 MG-UNIT tablet Take 1 tablet by mouth daily.  . Cinnamon 500 MG TABS Take 500 mg by mouth daily.   . Coenzyme Q10 (COQ10) 200 MG CAPS Take 200 mg by mouth daily.  . colestipol (COLESTID) 1 g tablet 2 g PO every day - take prior to lunch, at least 1 hour separate from other medications  . gabapentin (NEURONTIN) 300 MG capsule Take 300 mg by mouth at bedtime.  Marland Kitchen levothyroxine (SYNTHROID) 125 MCG tablet Take 1/2 tab daily except Mon/Thurs take 1 tablet on an empty stomach with only water for 30 minutes & no  Antacid meds, Calcium or Magnesium for 4 hours & avoid Biotin  . loratadine (CLARITIN) 10 MG tablet Take 10 mg by mouth daily.  . Multiple Vitamins-Minerals (MULTIVITAMIN WITH MINERALS) tablet Take 1 tablet by mouth daily.  . Multiple Vitamins-Minerals (ZINC PO) Take by mouth daily.  . Omega-3 Fatty Acids (FISH OIL) 1200 MG CAPS Take 1,200 mg by mouth daily.   . pantoprazole (PROTONIX) 40 MG tablet Take 1 tablet  2 x /day  for Heartburn & Indigestion  . Polyethyl Glycol-Propyl Glycol (SYSTANE) 0.4-0.3 % SOLN Place 1 drop into both eyes at bedtime.   . Probiotic Product (PROBIOTIC PO) Take 1 capsule by mouth daily.  Marland Kitchen PROLIA 60 MG/ML SOSY injection Inject 60 mg into the skin every 6 (six) months.  . simvastatin (ZOCOR) 20 MG tablet Take 1 tablet at Bedtime for Cholesterol   No current facility-administered medications on file prior to visit.    ROS: all negative except above.   Physical Exam:  BP 130/78   Pulse 63   Wt 115 lb (52.2 kg)   SpO2 98%   BMI 19.14 kg/m   General Appearance: Well nourished, in no apparent distress. Eyes: PERRLA, EOMs, conjunctiva no swelling or erythema ENT/Mouth: Ext aud canals clear, TMs without erythema, bulging. No erythema, swelling, or exudate on post pharynx.  Tonsils not swollen or erythematous. Hearing normal.  Neck: Supple, thyroid normal, not enlarged, without palpable nodules Respiratory: Respiratory effort normal, BS equal bilaterally without rales, rhonchi, wheezing or stridor.  Cardio: RRR with no MRGs. Brisk peripheral pulses without edema.  Abdomen: Soft, + BS.  Non tender, no guarding, rebound, hernias, masses. No CVS tenderness.  Lymphatics: Non tender without lymphadenopathy.  Musculoskeletal: Slow gait with cane Skin: Warm, dry without rashes, lesions, ecchymosis.  Neuro: Normal muscle tone Psych: Awake and oriented X 3, normal affect, Insight and Judgment appropriate.     Izora Ribas, NP 9:12 AM [facility]

## 2020-09-21 ENCOUNTER — Other Ambulatory Visit: Payer: Self-pay

## 2020-09-21 ENCOUNTER — Ambulatory Visit (INDEPENDENT_AMBULATORY_CARE_PROVIDER_SITE_OTHER): Payer: Medicare Other | Admitting: Adult Health

## 2020-09-21 ENCOUNTER — Encounter: Payer: Self-pay | Admitting: Adult Health

## 2020-09-21 VITALS — BP 130/78 | HR 63 | Wt 115.0 lb

## 2020-09-21 DIAGNOSIS — E039 Hypothyroidism, unspecified: Secondary | ICD-10-CM | POA: Diagnosis not present

## 2020-09-21 DIAGNOSIS — N200 Calculus of kidney: Secondary | ICD-10-CM | POA: Diagnosis not present

## 2020-09-21 NOTE — Patient Instructions (Signed)
Kidney Stones  Kidney stones are solid, rock-like deposits that form inside of the kidneys. The kidneys are a pair of organs that make urine. A kidney stone may form in a kidney and move into other parts of the urinary tract, including the tubes that connect the kidneys to the bladder (ureters), the bladder, and the tube that carries urine out of the body (urethra). As the stone moves through these areas, it can cause intense pain and block the flow of urine. Kidney stones are created when high levels of certain minerals are found in the urine. The stones are usually passed out of the body through urination, but in some cases, medical treatment may be needed to remove them. What are the causes? Kidney stones may be caused by:  A condition in which certain glands produce too much parathyroid hormone (primary hyperparathyroidism), which causes too much calcium buildup in the blood.  A buildup of uric acid crystals in the bladder (hyperuricosuria). Uric acid is a chemical that the body produces when you eat certain foods. It usually exits the body in the urine.  Narrowing (stricture) of one or both of the ureters.  A kidney blockage that is present at birth (congenital obstruction).  Past surgery on the kidney or the ureters, such as gastric bypass surgery. What increases the risk? The following factors may make you more likely to develop this condition:  Having had a kidney stone in the past.  Having a family history of kidney stones.  Not drinking enough water.  Eating a diet that is high in protein, salt (sodium), or sugar.  Being overweight or obese. What are the signs or symptoms? Symptoms of a kidney stone may include:  Pain in the side of the abdomen, right below the ribs (flank pain). Pain usually spreads (radiates) to the groin.  Needing to urinate frequently or urgently.  Painful urination.  Blood in the urine (hematuria).  Nausea.  Vomiting.  Fever and chills. How  is this diagnosed? This condition may be diagnosed based on:  Your symptoms and medical history.  A physical exam.  Blood tests.  Urine tests. These may be done before and after the stone passes out of your body through urination.  Imaging tests, such as a CT scan, abdominal X-ray, or ultrasound.  A procedure to examine the inside of the bladder (cystoscopy). How is this treated? Treatment for kidney stones depends on the size, location, and makeup of the stones. Kidney stones will often pass out of the body through urination. You may need to:  Increase your fluid intake to help pass the stone. In some cases, you may be given fluids through an IV and may need to be monitored at the hospital.  Take medicine for pain.  Make changes in your diet to help prevent kidney stones from coming back. Sometimes, medical procedures are needed to remove a kidney stone. This may involve:  A procedure to break up kidney stones using: ? A focused beam of light (laser therapy). ? Shock waves (extracorporeal shock wave lithotripsy).  Surgery to remove kidney stones. This may be needed if you have severe pain or have stones that block your urinary tract. Follow these instructions at home: Medicines  Take over-the-counter and prescription medicines only as told by your health care provider.  Ask your health care provider if the medicine prescribed to you requires you to avoid driving or using heavy machinery. Eating and drinking  Drink enough fluid to keep your urine pale yellow.  You may be instructed to drink at least 8-10 glasses of water each day. This will help you pass the kidney stone.  If directed, change your diet. This may include: ? Limiting how much sodium you eat. ? Eating more fruits and vegetables. ? Limiting how much animal protein--such as red meat, poultry, fish, and eggs--you eat.  Follow instructions from your health care provider about eating or drinking  restrictions. General instructions  Collect urine samples as told by your health care provider. You may need to collect a urine sample: ? 24 hours after you pass the stone. ? 8-12 weeks after passing the kidney stone, and every 6-12 months after that.  Strain your urine every time you urinate, for as long as directed. Use the strainer that your health care provider recommends.  Do not throw out the kidney stone after passing it. Keep the stone so it can be tested by your health care provider. Testing the makeup of your kidney stone may help prevent you from getting kidney stones in the future.  Keep all follow-up visits as told by your health care provider. This is important. You may need follow-up X-rays or ultrasounds to make sure that your stone has passed. How is this prevented? To prevent another kidney stone:  Drink enough fluid to keep your urine pale yellow. This is the best way to prevent kidney stones.  Eat a healthy diet and follow recommendations from your health care provider about foods to avoid. You may be instructed to eat a low-protein diet. Recommendations vary depending on the type of kidney stone that you have.  Maintain a healthy weight. Where to find more information  [place] ([facility]): www.kidney.[facility]): www.urologyhealth.org Contact a health care provider if:  You have pain that gets worse or does not get better with medicine. Get help right away if:  You have a fever or chills.  You develop severe pain.  You develop new abdominal pain.  You faint.  You are unable to urinate. Summary  Kidney stones are solid, rock-like deposits that form inside of the kidneys.  Kidney stones can cause nausea, vomiting, blood in the urine, abdominal pain, and the urge to urinate frequently.  Treatment for kidney stones depends on the size, location, and makeup of the stones. Kidney stones will often pass out of the body  through urination.  Kidney stones can be prevented by drinking enough fluids, eating a healthy diet, and maintaining a healthy weight. This information is not intended to replace advice given to you by your health care provider. Make sure you discuss any questions you have with your health care provider. Document Revised: 03/24/2019 Document Reviewed: 03/24/2019 Elsevier Patient Education  [place].

## 2020-09-22 LAB — TSH: TSH: 2.5 mIU/L (ref 0.40–4.50)

## 2020-09-23 DIAGNOSIS — Z681 Body mass index (BMI) 19 or less, adult: Secondary | ICD-10-CM | POA: Diagnosis not present

## 2020-09-23 DIAGNOSIS — M5481 Occipital neuralgia: Secondary | ICD-10-CM | POA: Diagnosis not present

## 2020-10-19 DIAGNOSIS — I10 Essential (primary) hypertension: Secondary | ICD-10-CM | POA: Diagnosis not present

## 2020-10-19 DIAGNOSIS — M47812 Spondylosis without myelopathy or radiculopathy, cervical region: Secondary | ICD-10-CM | POA: Diagnosis not present

## 2020-10-19 DIAGNOSIS — M5481 Occipital neuralgia: Secondary | ICD-10-CM | POA: Diagnosis not present

## 2020-10-25 ENCOUNTER — Ambulatory Visit: Admit: 2020-10-25 | Discharge: 2020-10-26 | Payer: MEDICARE | Attending: Internal Medicine | Primary: Internal Medicine

## 2020-10-25 ENCOUNTER — Encounter: Payer: Self-pay | Admitting: Internal Medicine

## 2020-10-25 DIAGNOSIS — K52839 Microscopic colitis, unspecified: Principal | ICD-10-CM

## 2020-10-25 MED ORDER — COLESTIPOL 1 GRAM TABLET
ORAL_TABLET | 3 refills | 0 days | Status: CP
Start: 2020-10-25 — End: ?

## 2020-10-26 ENCOUNTER — Other Ambulatory Visit: Payer: Self-pay

## 2020-10-26 ENCOUNTER — Encounter: Payer: Self-pay | Admitting: Adult Health Nurse Practitioner

## 2020-10-26 ENCOUNTER — Ambulatory Visit (INDEPENDENT_AMBULATORY_CARE_PROVIDER_SITE_OTHER): Payer: Medicare Other | Admitting: Adult Health Nurse Practitioner

## 2020-10-26 VITALS — BP 122/74 | HR 76 | Temp 97.6°F | Wt 112.0 lb

## 2020-10-26 DIAGNOSIS — J310 Chronic rhinitis: Secondary | ICD-10-CM

## 2020-10-26 DIAGNOSIS — K2100 Gastro-esophageal reflux disease with esophagitis, without bleeding: Secondary | ICD-10-CM | POA: Diagnosis not present

## 2020-10-26 NOTE — Progress Notes (Signed)
Assessment and Plan:  Debbie Houston was seen today for acute visit, headache, neck pain and otalgia.  Diagnoses and all orders for this visit:  Gastroesophageal reflux disease with esophagitis without hemorrhage Discussed starting famtodine as recommended by GI. Cotinue PPI Avoid triggers  Chronic rhinitis STOP nasal sprays at this time Get OTC saline or saline gel Take Zyrtec nightly, stop clairatin  Contact office with new or worsening sytmpoms.  If not improved consider dexamethasone taper?    Further disposition pending results of labs. Discussed med's effects and SE's.   Over 30 minutes of exam, counseling, chart review, and critical decision making was performed.   Future Appointments  Date Time Provider [place]  11/23/2020  2:30 PM Unk Pinto, MD [facility] None  05/15/2021  2:00 PM Liane Comber, NP [facility] None  08/08/2021 10:45 AM Bo Merino, MD [facility] None  08/21/2021  9:30 AM Liane Comber, NP [facility] None    ------------------------------------------------------------------------------------------------------------------   HPI 79 y.o.female presents for evaluation of dizziness and ear aches that is bilateral.  She reports that it can vary in severity from side to side.  She reports sh has had significant amount of drainage.    Reports her nose is red and irritated.  She started having bloody nose and has stopped using medicated sprays.  She has been using saline nasal spray and reports that part is better.    She is taking Claritin every morning.  Denies any coughing, there is some but relateed to drainage.  Denies any sore throat but does have tenderness external below ear.  She reports the dizziness is contrast and after standing it is worse.    She is taking protonix BID.    She recently has a visit with [facility].  Discussed plan as PPI BID and H2 famotadine at night. Intermediate colitis continue entocort 29m for now, continue pepto and  increase colestid to 4g daily.  Discussed initiatinfg vedolizumab, [place]  Abnormal CT 09/07/20 evidence of kidney stones and possible hydronephrosis, appointment with urology and referral placed 09/21/20.   Patient has not started H2 blocker, discussed with patient.  Past Medical History:  Diagnosis Date  . Arthritis   . Benign labile hypertension   . [place]    skin cancer on [place]  . Colitis   . DJD (degenerative joint disease)   . Endometrial polyp   . GERD (gastroesophageal reflux disease)   . History of basal cell carcinoma excision   . History of esophagitis   . HOH (hard of hearing)    right ear better than left  -- wears no aides  . Hyperlipidemia   . Hypothyroidism   . Osteopenia   . Prediabetes   . Vitamin D deficiency   . Wears glasses      Allergies  Allergen Reactions  . Fosamax [Alendronate Sodium] Other (See Comments)    GI upset   . Singulair [Montelukast Sodium] Other (See Comments)    Makes pt jittery  . Wellbutrin [Bupropion] Hives  . Sulfa Antibiotics Rash    Current Outpatient Medications on File Prior to Visit  Medication Sig  . acetaminophen (TYLENOL) 500 MG tablet Take 500 mg by mouth at bedtime as needed for moderate pain or headache.   . Ascorbic Acid (VITAMIN C) 1000 MG tablet Take 1,000 mg by mouth daily.   .Marland Kitchenaspirin EC 81 MG tablet Take 81 mg by mouth daily.   . bisoprolol-hydrochlorothiazide (ZIAC) 2.5-6.25 MG tablet Take 1 tablet Daily for BP (Patient  taking differently: Take 1 tablet by mouth daily. )  . budesonide (ENTOCORT EC) 3 MG 24 hr capsule Take 6 mg by mouth daily.   . Calcium Carbonate-Vitamin D (CALCIUM 600+D HIGH POTENCY) 600-400 MG-UNIT tablet Take 1 tablet by mouth daily.  . Cinnamon 500 MG TABS Take 500 mg by mouth daily.   . Coenzyme Q10 (COQ10) 200 MG CAPS Take 200 mg by mouth daily.  . colestipol (COLESTID) 1 g tablet 2 g PO every day - take prior to lunch, at least 1 hour separate from other medications  .  gabapentin (NEURONTIN) 300 MG capsule Take 300 mg by mouth at bedtime.  Marland Kitchen levothyroxine (SYNTHROID) 125 MCG tablet Take 1/2 tab daily except Mon/Thurs take 1 tablet on an empty stomach with only water for 30 minutes & no Antacid meds, Calcium or Magnesium for 4 hours & avoid Biotin  . loratadine (CLARITIN) 10 MG tablet Take 10 mg by mouth daily.  . Multiple Vitamins-Minerals (MULTIVITAMIN WITH MINERALS) tablet Take 1 tablet by mouth daily.  . Multiple Vitamins-Minerals (ZINC PO) Take by mouth daily.  . Omega-3 Fatty Acids (FISH OIL) 1200 MG CAPS Take 1,200 mg by mouth daily.   . pantoprazole (PROTONIX) 40 MG tablet Take 1 tablet  2 x /day  for Heartburn & Indigestion  . Polyethyl Glycol-Propyl Glycol (SYSTANE) 0.4-0.3 % SOLN Place 1 drop into both eyes at bedtime.   . Probiotic Product (PROBIOTIC PO) Take 1 capsule by mouth daily.  Marland Kitchen PROLIA 60 MG/ML SOSY injection Inject 60 mg into the skin every 6 (six) months.  . simvastatin (ZOCOR) 20 MG tablet Take 1 tablet at Bedtime for Cholesterol   No current facility-administered medications on file prior to visit.    ROS: all negative except above.   Physical Exam:  BP 122/74   Pulse 76   Temp 97.6 F (36.4 C)   Wt 112 lb (50.8 kg)   SpO2 97%   BMI 18.64 kg/m   General Appearance: Well nourished, in no apparent distress. Eyes: PERRLA, EOMs, conjunctiva no swelling or erythema Sinuses: No Frontal/maxillary tenderness ENT/Mouth: Ext aud canals clear, TMs without erythema, bulging. No erythema, swelling, or exudate on post pharynx.  Tonsils not swollen or erythematous. Hearing normal.  Neck: Supple, thyroid normal.  Respiratory: Respiratory effort normal, BS equal bilaterally without rales, rhonchi, wheezing or stridor.  Cardio: RRR with no MRGs. Brisk peripheral pulses without edema.  Abdomen: Soft, + BS.  Non tender, no guarding, rebound, hernias, masses. Lymphatics: Non tender without lymphadenopathy.  Musculoskeletal: Full ROM, 5/5  strength, normal gait.  Skin: Warm, dry without rashes, lesions, ecchymosis.  Neuro: Cranial nerves intact. Normal muscle tone, no cerebellar symptoms. Sensation intact.  Psych: Awake and oriented X 3, normal affect, Insight and Judgment appropriate.     Garnet Sierras, NP, NP 1:49 PM [facility]

## 2020-10-26 NOTE — Patient Instructions (Addendum)
   Add the famotidine (Pepsid) every night.  This will help with acid reflux.    Change the Claritin to Xyzal (levocetirazine),  Zyrtec (Cetirazine).  These two help to dry up fluid in your ears.  Take these medications at night, they can make you sleepy.   If you do not improve after a few days of taking the different antihistamine, contact the office.  We may need to do a round of steroids, dexamethasone.

## 2020-11-03 DIAGNOSIS — M25551 Pain in right hip: Secondary | ICD-10-CM | POA: Diagnosis not present

## 2020-11-06 ENCOUNTER — Encounter: Payer: Self-pay | Admitting: Adult Health Nurse Practitioner

## 2020-11-23 ENCOUNTER — Ambulatory Visit (INDEPENDENT_AMBULATORY_CARE_PROVIDER_SITE_OTHER): Payer: Medicare Other | Admitting: Internal Medicine

## 2020-11-23 ENCOUNTER — Other Ambulatory Visit: Payer: Self-pay

## 2020-11-23 VITALS — BP 116/64 | HR 86 | Temp 97.0°F | Resp 16 | Ht 64.5 in | Wt 111.0 lb

## 2020-11-23 DIAGNOSIS — M545 Low back pain, unspecified: Secondary | ICD-10-CM | POA: Diagnosis not present

## 2020-11-23 DIAGNOSIS — G8929 Other chronic pain: Secondary | ICD-10-CM | POA: Diagnosis not present

## 2020-11-23 DIAGNOSIS — Z79899 Other long term (current) drug therapy: Secondary | ICD-10-CM

## 2020-11-23 DIAGNOSIS — E782 Mixed hyperlipidemia: Secondary | ICD-10-CM | POA: Diagnosis not present

## 2020-11-23 DIAGNOSIS — K52831 Collagenous colitis: Secondary | ICD-10-CM

## 2020-11-23 DIAGNOSIS — R7309 Other abnormal glucose: Secondary | ICD-10-CM | POA: Diagnosis not present

## 2020-11-23 DIAGNOSIS — I10 Essential (primary) hypertension: Secondary | ICD-10-CM

## 2020-11-23 DIAGNOSIS — E559 Vitamin D deficiency, unspecified: Secondary | ICD-10-CM | POA: Diagnosis not present

## 2020-11-23 MED ORDER — PREGABALIN 25 MG PO CAPS: ORAL_CAPSULE | ORAL | 0 refills | Status: DC

## 2020-11-23 NOTE — Patient Instructions (Signed)

## 2020-11-23 NOTE — Progress Notes (Signed)
History of Present Illness:       This very nice 80 y.o.  MWF presents for 3 month follow up with HTN, HLD, Pre-Diabetes and Vitamin D Deficiency. Patient is followed by Dr Watt Climes for GERD & Collagenous Colitis. Patient c/o ongoing / chronic LBP w/o sciatica and was intolerant to sedation of Gabapentin.       Patient is treated for HTN (2015) & BP has been controlled at home. Today's BP is at goal - 116/64. Patient has had no complaints of any cardiac type chest pain, palpitations, dyspnea Vertell Limber /PND, dizziness, claudication, or dependent edema.      Hyperlipidemia is controlled with diet &  Simvastatin.   Patient denies myalgias or other med SE's. Last Lipids were at goal:  Lab Results  Component Value Date   CHOL 179 08/17/2020   HDL 65 08/17/2020   LDLCALC 99 08/17/2020   TRIG 64 08/17/2020   CHOLHDL 2.8 08/17/2020    Also, the patient has history of PreDiabetes(A1c 5.7%/2019)  and has had no symptoms of reactive hypoglycemia, diabetic polys, paresthesias or visual blurring.  Last A1c was at goal:  Lab Results  Component Value Date   HGBA1C 5.4 02/03/2020    Patientwas discoveredHypothyroidin 1990 and started onThyroid Replacement.       Further, the patient also has history of Vitamin D Deficiency and supplements vitamin D without any suspected side-effects. Last vitamin D was near goal (70-100):  Lab Results  Component Value Date   VD25OH 56 02/03/2020    Current Outpatient Medications on File Prior to Visit  Medication Sig  . acetaminophen (500 MG tablet Take 500 mg by mouth at bedtime as needed for moderate pain or headache.   Marland Kitchen VITAMIN C 1000 MG tablet Take  daily.   Marland Kitchen aspirin EC 81 MG tablet Take  daily.   . bisoprolol-hctz 2.5-6.25 MG Take 1 tablet Daily for BP  . ENTOCORT EC 3 MG Take  daily.   . Calcium -Vitamin D 600-400 MG-U  Take 1 tablet\ daily.  . Cinnamon 500 MG TABS Take [place].   .  Coenzyme Q10 \ 200 MG Take \daily.  .  2 g PO every day \  . gabapentin \300 MG capsule Take \ at bedtime.  Marland Kitchen levothyroxine  125 MCG  Take 1/2 tab daily except Mon/Thurs take 1 tablet   . loratadine \ 10 MG tablet Take 1\ daily.  . Multiple Vitamins-Minerals Take 1 tablet \ daily.  . Omega-3 \FISH OIL 1200 MG  Take\\ daily.   . pantoprazole 40 MG tablet Take 1 tablet  2 x /day \  . Polyethyl Glycol 0.4-0.3 % SOLN Place 1 drop into both eyes at bedtime.  . Probiotic  Take 1 capsule \ daily.  Marland Kitchen PROLIA 60 MG/ML  injec Inject 60 mg into the skin   every 6\months.  . simvastatin 20 MG tablet Take 1 tablet at Bedtime\     Allergies  Allergen Reactions  . Fosamax [Alendronate Sodium] Other (See Comments)    GI upset   . Singulair [Montelukast Sodium] Other (See Comments)    Makes pt jittery  . Wellbutrin [Bupropion] Hives  . Sulfa Antibiotics Rash    PMHx:   Past Medical History:  Diagnosis Date  . Arthritis   . Benign labile hypertension   . Cancer ([place])    skin cancer on face  . Colitis   . DJD (degenerative joint disease)   . Endometrial polyp   .  GERD (gastroesophageal reflux disease)   . History of basal cell carcinoma excision   . History of esophagitis   . HOH (hard of hearing)    right ear better than left  -- wears no aides  . Hyperlipidemia   . Hypothyroidism   . Osteopenia   . Prediabetes   . Vitamin D deficiency   . Wears glasses     Immunization History  Administered Date(s) Administered  . Influenza, High Dose Seasonal PF  08/22/2018, 08/25/2019, 08/30/2020  . PFIZER SARS-COV-2 Vaccination 12/10/2019, 12/31/2019, 08/16/2020  . Pneumococcal Conjugate-13 05/05/2015  . Pneumococcal-Unspecified 08/22/2011  . Tdap 08/22/2011  . Zoster 06/20/2006  . Zoster Recombinat (Shingrix) 09/13/2017, 01/01/2018    Past Surgical History:  Procedure Laterality Date  . BACK SURGERY  2020  . CATARACT EXTRACTION, BILATERAL Bilateral 2020  . DILATATION &  CURETTAGE/HYSTEROSCOPY WITH MYOSURE N/A 10/27/2014   Procedure: DILATATION & CURETTAGE/HYSTEROSCOPY WITH MYOSURE;  Surgeon: Darlyn Chamber, MD;  Location: [place];  Service: Gynecology;  Laterality: N/A;  . DILATION AND CURETTAGE OF UTERUS    . HIP SURGERY Right 04/2020   rod and screw in right hip   . MOHS SURGERY  2005   LEFT NASAL BRIDGE FOR BASAL CELL  . TONSILLECTOMY  age 36    FHx:    Reviewed / unchanged  SHx:    Reviewed / unchanged   Systems Review:  Constitutional: Denies fever, chills, wt changes, headaches, insomnia, fatigue, night sweats, change in appetite. Eyes: Denies redness, blurred vision, diplopia, discharge, itchy, watery eyes.  ENT: Denies discharge, congestion, post nasal drip, epistaxis, sore throat, earache, hearing loss, dental pain, tinnitus, vertigo, sinus pain, snoring.  CV: Denies chest pain, palpitations, irregular heartbeat, syncope, dyspnea, diaphoresis, orthopnea, PND, claudication or edema. Respiratory: denies cough, dyspnea, DOE, pleurisy, hoarseness, laryngitis, wheezing.  Gastrointestinal: Denies dysphagia, odynophagia, heartburn, reflux, water brash, abdominal pain or cramps, nausea, vomiting, bloating, diarrhea, constipation, hematemesis, melena, hematochezia  or hemorrhoids. Genitourinary: Denies dysuria, frequency, urgency, nocturia, hesitancy, discharge, hematuria or flank pain. Musculoskeletal: Denies arthralgias, myalgias, stiffness, jt. swelling, pain, limping or strain/sprain.  Skin: Denies pruritus, rash, hives, warts, acne, eczema or change in skin lesion(s). Neuro: No weakness, tremor, incoordination, spasms, paresthesia or pain. Psychiatric: Denies confusion, memory loss or sensory loss. Endo: Denies change in weight, skin or hair change.  Heme/Lymph: No excessive bleeding, bruising or enlarged lymph nodes.  Physical Exam  BP 116/64   Pulse 86   Temp (!) 97 F (36.1 C)   Resp 16   Ht 5' 4.5" (1.638 m)   Wt 111 lb  (50.3 kg)   SpO2 97%   BMI 18.76 kg/m   Appears  well nourished, well groomed  and in no distress.  Eyes: PERRLA, EOMs, conjunctiva no swelling or erythema. Sinuses: No frontal/maxillary tenderness ENT/Mouth: EAC's clear, TM's nl w/o erythema, bulging. Nares clear w/o erythema, swelling, exudates. Oropharynx clear without erythema or exudates. Oral hygiene is good. Tongue normal, non obstructing. Hearing intact.  Neck: Supple. Thyroid not palpable. Car 2+/2+ without bruits, nodes or JVD. Chest: Respirations nl with BS clear & equal w/o rales, rhonchi, wheezing or stridor.  Cor: Heart sounds normal w/ regular rate and rhythm without sig. murmurs, gallops, clicks or rubs. Peripheral pulses normal and equal  without edema.  Abdomen: Soft & bowel sounds normal. Non-tender w/o guarding, rebound, hernias, masses or organomegaly.  Lymphatics: Unremarkable.  Musculoskeletal: Full ROM all peripheral extremities, joint stability, 5/5 strength and normal gait.  Skin: Warm,  dry without exposed rashes, lesions or ecchymosis apparent.  Neuro: Cranial nerves intact, reflexes equal bilaterally. Sensory-motor testing grossly intact. Tendon reflexes grossly intact.  Pysch: Alert & oriented x 3.  Insight and judgement nl & appropriate. No ideations.  Assessment and Plan:  1. Essential hypertension  - Continue medication, monitor blood pressure at home.  - Continue DASH diet.  Reminder to go to the ER if any CP,  SOB, nausea, dizziness, severe HA, changes vision/speech.  - CBC with Differential/Platelet - COMPLETE METABOLIC PANEL WITH GFR - Magnesium - TSH  2. Hyperlipidemia, mixed  - Continue diet/meds, exercise,& lifestyle modifications.  - Continue monitor periodic cholesterol/liver & renal functions   - Lipid panel - TSH  3. Abnormal glucose  - Continue diet, exercise  - Lifestyle modifications.  - Monitor appropriate labs.  - Fructosamine - Insulin, random  4. Vitamin D  deficiency  - Continue supplementation.  - VITAMIN D 25 Hydroxy  5. Medication management  - CBC with Differential/Platelet - COMPLETE METABOLIC PANEL WITH GFR - Magnesium - Lipid panel - TSH - Fructosamine - Insulin, random - VITAMIN D 25 Hydroxy  6. Chronic bilateral low back pain without sciatica  - pregabalin (LYRICA) 25 MG capsule; Take       1 to 3 capsules       3 x /day       for Chronic Back Pain  Dispense: 90 capsule; Refill: 0        Discussed  regular exercise, BP monitoring, weight control to achieve/maintain BMI less than 25 and discussed med and SE's. Recommended labs to assess and monitor clinical status with further disposition pending results of labs.  I discussed the assessment and treatment plan with the patient. The patient was provided an opportunity to ask questions and all were answered. The patient agreed with the plan and demonstrated an understanding of the instructions.  I provided over 30 minutes of exam, counseling, chart review and  complex critical decision making.       The patient was advised to call back or seek an in-person evaluation if the symptoms worsen or if the condition fails to improve as anticipated.   Kirtland Bouchard, MD

## 2020-11-24 ENCOUNTER — Encounter: Payer: Self-pay | Admitting: Internal Medicine

## 2020-11-24 MED ORDER — PREGABALIN 25 MG PO CAPS: ORAL_CAPSULE | ORAL | 0 refills | Status: DC

## 2020-11-24 MED ORDER — DICYCLOMINE HCL 20 MG PO TABS: ORAL_TABLET | ORAL | 0 refills | Status: DC

## 2020-11-24 NOTE — Addendum Note (Signed)
Addended by: Unk Pinto on: 11/24/2020 03:12 PM   Modules accepted: Orders

## 2020-11-24 NOTE — Progress Notes (Signed)
========================================================== -   Test results slightly outside the reference range are not unusual. If there is anything important, I will review this with you,  otherwise it is considered normal test values.  If you have further questions,  please do not hesitate to contact me at the office or via My Chart.  ========================================================== ==========================================================  -   Magnesium  -   1.6   -  very  low- goal is betw 2.0 - 2.5,   - So..............Marland Kitchen  Recommend that you take  Magnesium 500 mg tablet daily   - also important to eat lots of  leafy green vegetables   - spinach - Kale - collards - greens - okra - asparagus  - broccoli - quinoa - squash - almonds   - black, red, white beans  -  peas - green beans ========================================================== ==========================================================  -  Total Chol = 160    & LDL Chol = 79 - Both  Excellent   - Very low risk for Heart Attack  / Stroke ==========================================================  - Vitamin D = 47 - is Low   - Vitamin D goal is between 70-100.   - Please INCREASE or ADD  Vitamin D   5,000 units more /day                                                                               To your current dosage   - It is very important as a natural anti-inflammatory and helping the  immune system protect against viral infections, like the Covid-19    helping hair, skin, and nails, as well as reducing stroke and  heart attack risk.   - It helps your bones and helps with mood.  - It also decreases numerous cancer risks so please  take it as directed.   - Low Vit D is associated with a 200-300% higher risk for  CANCER   and 200-300% higher risk for HEART   ATTACK  &  STROKE.    - It is also associated with higher death rate at younger ages,   autoimmune diseases like Rheumatoid  arthritis, Lupus,  Multiple Sclerosis.     - Also many other serious conditions, like depression, Alzheimer's  Dementia, infertility, muscle aches, fatigue, fibromyalgia   - just to name a few. ==========================================================  - All Else - CBC - Kidneys - Electrolytes - Liver - Magnesium & Thyroid    - all  Normal / OK ===========================================================   - Keep up the [place] Work   !  ===========================================================

## 2020-11-25 ENCOUNTER — Telehealth: Payer: Medicare Other | Admitting: Adult Health

## 2020-11-25 ENCOUNTER — Encounter: Payer: Self-pay | Admitting: Adult Health

## 2020-11-25 DIAGNOSIS — N390 Urinary tract infection, site not specified: Secondary | ICD-10-CM | POA: Diagnosis not present

## 2020-11-25 LAB — CBC WITH DIFFERENTIAL/PLATELET
Absolute Monocytes: 445 cells/uL (ref 200–950)
Basophils Absolute: 18 cells/uL (ref 0–200)
Basophils Relative: 0.3 %
Eosinophils Absolute: 31 cells/uL (ref 15–500)
Eosinophils Relative: 0.5 %
HCT: 38.8 % (ref 35.0–45.0)
Hemoglobin: 13.4 g/dL (ref 11.7–15.5)
Lymphs Abs: 634 cells/uL — ABNORMAL LOW (ref 850–3900)
MCH: 34 pg — ABNORMAL HIGH (ref 27.0–33.0)
MCHC: 34.5 g/dL (ref 32.0–36.0)
MCV: 98.5 fL (ref 80.0–100.0)
MPV: 10.3 fL (ref 7.5–12.5)
Monocytes Relative: 7.3 %
Neutro Abs: 4972 cells/uL (ref 1500–7800)
Neutrophils Relative %: 81.5 %
Platelets: 200 10*3/uL (ref 140–400)
RBC: 3.94 10*6/uL (ref 3.80–5.10)
RDW: 13.4 % (ref 11.0–15.0)
Total Lymphocyte: 10.4 %
WBC: 6.1 10*3/uL (ref 3.8–10.8)

## 2020-11-25 LAB — COMPLETE METABOLIC PANEL WITH GFR
AG Ratio: 2.7 (calc) — ABNORMAL HIGH (ref 1.0–2.5)
ALT: 19 U/L (ref 6–29)
AST: 22 U/L (ref 10–35)
Albumin: 4.3 g/dL (ref 3.6–5.1)
Alkaline phosphatase (APISO): 49 U/L (ref 37–153)
BUN: 12 mg/dL (ref 7–25)
CO2: 23 mmol/L (ref 20–32)
Calcium: 9.2 mg/dL (ref 8.6–10.4)
Chloride: 98 mmol/L (ref 98–110)
Creat: 0.89 mg/dL (ref 0.60–0.93)
GFR, Est African American: 71 mL/min/{1.73_m2} (ref >=60)
GFR, Est Non African American: 62 mL/min/{1.73_m2} (ref >=60)
Globulin: 1.6 g/dL (calc) — ABNORMAL LOW (ref 1.9–3.7)
Glucose, Bld: 134 mg/dL — ABNORMAL HIGH (ref 65–99)
Potassium: 3.6 mmol/L (ref 3.5–5.3)
Sodium: 132 mmol/L — ABNORMAL LOW (ref 135–146)
Total Bilirubin: 0.5 mg/dL (ref 0.2–1.2)
Total Protein: 5.9 g/dL — ABNORMAL LOW (ref 6.1–8.1)

## 2020-11-25 LAB — TSH: TSH: 2.58 mIU/L (ref 0.40–4.50)

## 2020-11-25 LAB — LIPID PANEL
Cholesterol: 160 mg/dL (ref <200)
HDL: 64 mg/dL (ref >=50)
LDL Cholesterol (Calc): 79 mg/dL (calc)
Non-HDL Cholesterol (Calc): 96 mg/dL (calc) (ref <130)
Total CHOL/HDL Ratio: 2.5 (calc) (ref <5.0)
Triglycerides: 85 mg/dL (ref <150)

## 2020-11-25 LAB — FRUCTOSAMINE: Fructosamine: 240 umol/L (ref 205–285)

## 2020-11-25 LAB — MAGNESIUM: Magnesium: 1.6 mg/dL (ref 1.5–2.5)

## 2020-11-25 LAB — INSULIN, RANDOM: Insulin: 80.3 u[IU]/mL — ABNORMAL HIGH

## 2020-11-25 LAB — VITAMIN D 25 HYDROXY (VIT D DEFICIENCY, FRACTURES): Vit D, 25-Hydroxy: 47 ng/mL (ref 30–100)

## 2020-11-25 MED ORDER — AMOXICILLIN-POT CLAVULANATE 875-125 MG PO TABS: 1.0000 | ORAL_TABLET | Freq: Two times a day (BID) | ORAL | 0 refills | Status: DC

## 2020-11-25 NOTE — Telephone Encounter (Signed)
Virtual Visit via Telephone Note  I connected with Debbie Houston on 11/25/2020  at 1335 by telephone and verified that I am speaking with the correct person using two identifiers.  Location: Patient: brother's home Provider: [place] office    I discussed the limitations, risks, security and privacy concerns of performing an evaluation and management service by telephone and the availability of in person appointments. I also discussed with the patient that there may be a patient responsible charge related to this service. The patient expressed understanding and agreed to proceed.   History of Present Illness:  80 y.o. female called to report home test + for UTI, unable to come in to office,   She reports sx began 3-4 days ago, became much worse last night She reports dysuria, urgency, frequency; denies flank pain, fever/chills, hematuria, abdominal pain; she took home dip test this AM was positive. Unable to come in as she is caring for her brother on hospice;   Notes increased frequency of UTIs this last year, is scheduled for initial urology evaluation this Monday; Also has kidney stone -   Last urine culture 08/30/2020 showed klebsiella P resistant to ampicillin, cefazolin, nitrofurantoin. She is allergic to sulfa. She was prescribed augmentin at that time and reports responded well, did not have recurrent sx until this recent episode.     Lab Results  Component Value Date   GFRNONAA 62 11/23/2020    Allergies:  Allergies  Allergen Reactions  . Fosamax [Alendronate Sodium] Other (See Comments)    GI upset   . Singulair [Montelukast Sodium] Other (See Comments)    Makes pt jittery  . Wellbutrin [Bupropion] Hives  . Sulfa Antibiotics Rash   Medical History:  has Essential hypertension; Hyperlipidemia, mixed; Hypothyroid; GERD; Asthma; DJD (degenerative joint disease); Vitamin D deficiency; Osteopenia; Allergy; Medication management; Colitis; Encounter for Medicare annual wellness  exam; Spondylolisthesis at L4-L5 level; Vertigo; Bilateral temporomandibular joint pain; Senile purpura ([place]); S/P right hip fracture; and Bilateral nephrolithiasis on their problem list. Surgical History:  She  has a past surgical history that includes Mohs surgery (2005); Tonsillectomy (age 50); Dilation and curettage of uterus; Dilatation & curettage/hysteroscopy with myosure (N/A, 10/27/2014); Back surgery (2020); Hip surgery (Right, 04/2020); and Cataract extraction, bilateral (Bilateral, 2020). Family History:  Herfamily history includes Autism in her son; Cancer in her father, maternal grandmother, mother, and sister; Colitis in her son; Diabetes in her brother and maternal grandfather; Healthy in her son; Hyperlipidemia in her brother, father, maternal grandfather, maternal grandmother, mother, paternal grandfather, paternal grandmother, and sister; Hypertension in her brother and mother; Stroke in her maternal grandfather. Social History:   reports that she has quit smoking. Her smoking use included cigarettes. She quit after 15.00 years of use. She has never used smokeless tobacco. She reports current alcohol use. She reports that she does not use drugs.   Observations/Objective:  General : Well sounding patient in no apparent distress HEENT: no hoarseness, no cough for duration of visit Lungs: speaks in complete sentences, no audible wheezing, no apparent distress Neurological: alert, oriented x 3 Psychiatric: pleasant, judgement appropriate   Assessment and Plan:  Diagnoses and all orders for this visit:  Urinary tract infection without hematuria, site unspecified Medications: augmentin. Per last culture Maintain adequate hydration. Follow up if symptoms not improving or recurrent, should expect improvement in 12- 24 hours ED if rigors, confusion, severely ill  -     amoxicillin-clavulanate (AUGMENTIN) 875-125 MG tablet; Take 1 tablet by mouth 2 (two)  times daily.  Frequent  UTI Scheduled to see urology on Monday  Follow Up Instructions:    I discussed the assessment and treatment plan with the patient. The patient was provided an opportunity to ask questions and all were answered. The patient agreed with the plan and demonstrated an understanding of the instructions.   The patient was advised to call back or seek an in-person evaluation if the symptoms worsen or if the condition fails to improve as anticipated.  I provided 15 minutes of non-face-to-face time during this encounter.   Izora Ribas, NP

## 2020-11-26 NOTE — Progress Notes (Signed)
============================================================ -   Fructosamine  = 240 mg % approximates an A1c 5.6%    - Both Normal - Non-Diabetic  ============================================================

## 2020-11-28 ENCOUNTER — Other Ambulatory Visit: Payer: Self-pay | Admitting: Internal Medicine

## 2020-11-28 DIAGNOSIS — G8929 Other chronic pain: Secondary | ICD-10-CM

## 2020-11-28 MED ORDER — PREGABALIN 50 MG PO CAPS: ORAL_CAPSULE | ORAL | 0 refills | Status: DC

## 2020-12-01 ENCOUNTER — Telehealth: Payer: Self-pay | Admitting: *Deleted

## 2020-12-01 NOTE — Telephone Encounter (Signed)
Left message, at patient's request, to inform her Pregabalin dose was increased for 25 mg to 50 mg, due to insurance quantity limit. Patient advised to stasrt with 1 capsule 3 times a day.

## 2020-12-15 DIAGNOSIS — M47812 Spondylosis without myelopathy or radiculopathy, cervical region: Secondary | ICD-10-CM | POA: Diagnosis not present

## 2020-12-15 DIAGNOSIS — G4486 Cervicogenic headache: Secondary | ICD-10-CM | POA: Diagnosis not present

## 2020-12-15 DIAGNOSIS — M7918 Myalgia, other site: Secondary | ICD-10-CM | POA: Diagnosis not present

## 2020-12-26 DIAGNOSIS — M85851 Other specified disorders of bone density and structure, right thigh: Secondary | ICD-10-CM | POA: Diagnosis not present

## 2020-12-26 DIAGNOSIS — M810 Age-related osteoporosis without current pathological fracture: Secondary | ICD-10-CM | POA: Diagnosis not present

## 2020-12-26 DIAGNOSIS — Z780 Asymptomatic menopausal state: Secondary | ICD-10-CM | POA: Diagnosis not present

## 2020-12-26 LAB — HM DEXA SCAN

## 2020-12-28 ENCOUNTER — Other Ambulatory Visit: Payer: Self-pay | Admitting: Internal Medicine

## 2020-12-28 DIAGNOSIS — I10 Essential (primary) hypertension: Secondary | ICD-10-CM

## 2021-01-02 DIAGNOSIS — N200 Calculus of kidney: Secondary | ICD-10-CM | POA: Diagnosis not present

## 2021-01-02 DIAGNOSIS — M47812 Spondylosis without myelopathy or radiculopathy, cervical region: Secondary | ICD-10-CM | POA: Diagnosis not present

## 2021-01-05 DIAGNOSIS — Z803 Family history of malignant neoplasm of breast: Secondary | ICD-10-CM | POA: Diagnosis not present

## 2021-01-05 DIAGNOSIS — Z1231 Encounter for screening mammogram for malignant neoplasm of breast: Secondary | ICD-10-CM | POA: Diagnosis not present

## 2021-01-05 LAB — HM MAMMOGRAPHY

## 2021-01-10 ENCOUNTER — Encounter: Payer: Self-pay | Admitting: Adult Health Nurse Practitioner

## 2021-01-10 ENCOUNTER — Other Ambulatory Visit: Payer: Self-pay

## 2021-01-10 ENCOUNTER — Ambulatory Visit (INDEPENDENT_AMBULATORY_CARE_PROVIDER_SITE_OTHER): Payer: Medicare Other | Admitting: Adult Health Nurse Practitioner

## 2021-01-10 VITALS — BP 118/62 | HR 74 | Temp 97.5°F | Wt 111.0 lb

## 2021-01-10 DIAGNOSIS — H6123 Impacted cerumen, bilateral: Secondary | ICD-10-CM

## 2021-01-10 DIAGNOSIS — B3731 Acute candidiasis of vulva and vagina: Secondary | ICD-10-CM

## 2021-01-10 DIAGNOSIS — H6501 Acute serous otitis media, right ear: Secondary | ICD-10-CM

## 2021-01-10 DIAGNOSIS — B373 Candidiasis of vulva and vagina: Secondary | ICD-10-CM

## 2021-01-10 DIAGNOSIS — H9203 Otalgia, bilateral: Secondary | ICD-10-CM

## 2021-01-10 DIAGNOSIS — R300 Dysuria: Secondary | ICD-10-CM

## 2021-01-10 MED ORDER — CEPHALEXIN 500 MG PO CAPS: ORAL_CAPSULE | ORAL | 0 refills | Status: DC

## 2021-01-10 MED ORDER — FLUCONAZOLE 150 MG PO TABS: ORAL_TABLET | ORAL | 0 refills | Status: DC

## 2021-01-10 NOTE — Patient Instructions (Addendum)
Get saline nasal gel to use to help with sore nose.    Ear care: Both of your ears are clear of wax at this time Use 2-3 drops of oil for next 3-4 nights.   You can use olive oil or mineral oil This will help to sooth your ear canals.  Use cotton ball to keep in ears while sleeping.  For regular maintenance: Use warm or room temp peroxide in ear once every 1-2 weeks, then apply oil that night x1.   Next morning let warm water from shower run into your ears.   This will help flush out any wax Do not use cotton swabs in your ears Should you have pain, decrease in your ability to hear or dizziness please contact the office for an appointment.     Change your clairitin  Allergy Symptoms / Runny Nose: Chose one  Zyrtec / Cetirizine Take 76m by mouth May cause drowsiness, take nightly Be sure to drink plenty of water If this is not effective, try Xyzal or Allegra  OR  Xyzal / Levocetirazine  Take 566mby mouth May cause drowsiness, take nightly Be sure to drink plenty of water If this is not effective try Allegra or Zyrtec  OR  Allegra / fexofenadine Take 180101my mouth daily If this is not effective try Zyrtec or Xyzal   *If you battle with chronic allergies you may need to change the antihistamine you currently use to find most effective.    Saine Nasal Spray: You can get this at any pharmacy Use as directed on package This will help to sooth inside of your nose from irritation  [facility] Sinus Rinse / Neti Pot Use warm bottled or distilled water DO NOT use tap water! Use twice a day as needed This will help to sooth irritated sinuses and clear nasal congestion If using nasal sprays, do so after completing this.   Flonase: Do not use while having bloody nose One spray in each nostril daily  This will help to open your nasal passages Use this AFTER you do any type of nasal rinse   For Inflammation of Sinuses  Ibuprofen / Advil / Motrin - Will also  help with inflammation  600m81mery six hours OR 800mg23mry 8 hours If you have this at home each tablet is 200mg.54mr Sinus Pain:  Tylenol Extra Strength 500mg T34m1-2 tablets every 8 hours as needed for fever or pain Do not take more than 4,000mg to20menol in 24 hours  May rotate between Tylenol and ibuprofen.  They are different.

## 2021-01-10 NOTE — Progress Notes (Signed)
Assessment and Plan:  Tachina was seen today for otalgia, dizziness and nasal congestion.  Diagnoses and all orders for this visit:  Non-recurrent acute serous otitis media of right ear -     cephALEXin (KEFLEX) 500 MG capsule; Take 1 capsule 4 x/day after meals & bedtime for infection Change antihistamine to cetirizine nightly  Dysuria -     Urinalysis w microscopic + reflex cultur  Vaginal candida -     fluconazole (DIFLUCAN) 150 MG tablet; Take one tablet at onset of symptoms and second tablet three days later for yeast.  Other orders -     REFLEXIVE URINE CULTURE Oltagia Cerumen impaction Lavage completed, tolerated well Manual removal of wax Discussed after care and routine ear manie  Further disposition pending results of labs. Discussed med's effects and SE's.   Over 30 minutes of face to face interview, exam, counseling, chart review, and critical decision making was performed.   Future Appointments  Date Time Provider [place]  05/15/2021  2:00 PM Liane Comber, NP [facility] None  08/08/2021 10:45 AM Bo Merino, MD [facility] None  08/21/2021  9:30 AM Liane Comber, NP [facility] None    ------------------------------------------------------------------------------------------------------------------   HPI 80 y.o.female presents for evaluation of oltalgia and dizziness.  She is having vertigo every day, specifically in the morning and with position changes.  She does wait between position changes.  She reports it is increasingly bothersome.  Reports that her bilateral ear fullness.  She reports when she lays down it is tender bilaterally.  No drainage on pillows.  When she blows her nose she is getting trace blood.  Sh uses saline spray daily BID.  Bleeding happens intermittently between this.  Last time she saw ENT Arnette Norris was over one year ago.  She reports hoarseness to her throat.  Currently using Claritin and saline nasal spray    Past Medical  History:  Diagnosis Date  . Arthritis   . Benign labile hypertension   . Cancer ([place])    skin cancer on face  . Colitis   . DJD (degenerative joint disease)   . Endometrial polyp   . GERD (gastroesophageal reflux disease)   . History of basal cell carcinoma excision   . History of esophagitis   . HOH (hard of hearing)    right ear better than left  -- wears no aides  . Hyperlipidemia   . Hypothyroidism   . Osteopenia   . Prediabetes   . Vitamin D deficiency   . Wears glasses      Allergies  Allergen Reactions  . Fosamax [Alendronate Sodium] Other (See Comments)    GI upset   . Singulair [Montelukast Sodium] Other (See Comments)    Makes pt jittery  . Wellbutrin [Bupropion] Hives  . Sulfa Antibiotics Rash    Current Outpatient Medications on File Prior to Visit  Medication Sig  . acetaminophen (TYLENOL) 500 MG tablet Take 500 mg by mouth at bedtime as needed for moderate pain or headache.   . Ascorbic Acid (VITAMIN C) 1000 MG tablet Take 1,000 mg by mouth daily.   Marland Kitchen aspirin EC 81 MG tablet Take 81 mg by mouth daily.   . bisoprolol-hydrochlorothiazide (ZIAC) 2.5-6.25 MG tablet Take  1 tablet  Daily  for BP  . budesonide (ENTOCORT EC) 3 MG 24 hr capsule Take 6 mg by mouth daily.   . Calcium Carbonate-Vitamin D 600-400 MG-UNIT tablet Take 1 tablet by mouth daily.  . Cinnamon 500 MG TABS Take 500 mg  by mouth daily.   . Coenzyme Q10 (COQ10) 200 MG CAPS Take 200 mg by mouth daily.  . colestipol (COLESTID) 1 g tablet 2 g PO every day - take prior to lunch, at least 1 hour separate from other medications  . dicyclomine (BENTYL) 20 MG tablet Take   1 tablet     3 x /day     before Meals  . levothyroxine (SYNTHROID) 125 MCG tablet Take 1/2 tab daily except Mon/Thurs take 1 tablet on an empty stomach with only water for 30 minutes & no Antacid meds, Calcium or Magnesium for 4 hours & avoid Biotin  . loratadine (CLARITIN) 10 MG tablet Take 10 mg by mouth daily.  . Multiple  Vitamins-Minerals (MULTIVITAMIN WITH MINERALS) tablet Take 1 tablet by mouth daily.  . Multiple Vitamins-Minerals (ZINC PO) Take by mouth daily.  . Omega-3 Fatty Acids (FISH OIL) 1200 MG CAPS Take 1,200 mg by mouth daily.   . pantoprazole (PROTONIX) 40 MG tablet Take 1 tablet  2 x /day  for Heartburn & Indigestion  . Polyethyl Glycol-Propyl Glycol 0.4-0.3 % SOLN Place 1 drop into both eyes at bedtime.  . Probiotic Product (PROBIOTIC PO) Take 1 capsule by mouth daily.  Marland Kitchen PROLIA 60 MG/ML SOSY injection Inject 60 mg into the skin every 6 (six) months.  . simvastatin (ZOCOR) 20 MG tablet Take 1 tablet at Bedtime for Cholesterol   No current facility-administered medications on file prior to visit.    ROS: all negative except above.   Physical Exam:  BP 118/62   Pulse 74   Temp (!) 97.5 F (36.4 C)   Wt 111 lb (50.3 kg)   SpO2 96%   BMI 18.76 kg/m   General Appearance: Well nourished, in no apparent distress. Eyes: PERRLA, EOMs, conjunctiva no swelling or erythema Sinuses:  Frontal/maxillary tenderness ENT/Mouth: Ext aud canals clear on left, right canal cerumen impaction.  TMs without erythema, bulging. No erythema, swelling, or exudate on post pharynx.  Tonsils not swollen or erythematous. Hearing normal.  Neck: Supple, thyroid normal.  Respiratory: Respiratory effort normal, BS equal bilaterally without rales, rhonchi, wheezing or stridor.  Cardio: RRR with no MRGs. Brisk peripheral pulses without edema.  Abdomen: Soft, + BS.  Non tender, no guarding, rebound, hernias, masses. Lymphatics: Non tender without lymphadenopathy.  Musculoskeletal: Full ROM, 5/5 strength, normal gait.  Skin: Warm, dry without rashes, lesions, ecchymosis.  Neuro: Cranial nerves intact. Normal muscle tone, no cerebellar symptoms. Sensation intact.  Psych: Awake and oriented X 3, normal affect, Insight and Judgment appropriate.    Lavage preformed, patient tolerated well, manual removal of wax. Scant  amonut of blood to right canal from wax removal.  Garnet Sierras, AGNP-C, DNP [facility] 01/10/2021  11:58 AM

## 2021-01-11 LAB — URINALYSIS W MICROSCOPIC + REFLEX CULTURE
Bacteria, UA: NONE SEEN /HPF
Bilirubin Urine: NEGATIVE
Glucose, UA: NEGATIVE
Hgb urine dipstick: NEGATIVE
Hyaline Cast: NONE SEEN /LPF
Ketones, ur: NEGATIVE
Leukocyte Esterase: NEGATIVE
Nitrites, Initial: NEGATIVE
Protein, ur: NEGATIVE
RBC / HPF: NONE SEEN /HPF (ref 0–2)
Specific Gravity, Urine: 1.008 (ref 1.001–1.03)
Squamous Epithelial / HPF: NONE SEEN /HPF (ref <=5)
WBC, UA: NONE SEEN /HPF (ref 0–5)
pH: 7.5 (ref 5.0–8.0)

## 2021-01-11 LAB — NO CULTURE INDICATED

## 2021-01-12 ENCOUNTER — Encounter: Payer: Self-pay | Admitting: *Deleted

## 2021-01-17 ENCOUNTER — Other Ambulatory Visit: Payer: Self-pay | Admitting: Internal Medicine

## 2021-01-17 DIAGNOSIS — I8311 Varicose veins of right lower extremity with inflammation: Secondary | ICD-10-CM | POA: Diagnosis not present

## 2021-01-17 DIAGNOSIS — I8312 Varicose veins of left lower extremity with inflammation: Secondary | ICD-10-CM | POA: Diagnosis not present

## 2021-01-25 DIAGNOSIS — E559 Vitamin D deficiency, unspecified: Secondary | ICD-10-CM | POA: Diagnosis not present

## 2021-01-25 DIAGNOSIS — R5383 Other fatigue: Secondary | ICD-10-CM | POA: Diagnosis not present

## 2021-01-25 DIAGNOSIS — M810 Age-related osteoporosis without current pathological fracture: Secondary | ICD-10-CM | POA: Diagnosis not present

## 2021-01-30 DIAGNOSIS — M47812 Spondylosis without myelopathy or radiculopathy, cervical region: Secondary | ICD-10-CM | POA: Diagnosis not present

## 2021-01-30 DIAGNOSIS — M7918 Myalgia, other site: Secondary | ICD-10-CM | POA: Diagnosis not present

## 2021-01-30 DIAGNOSIS — G4486 Cervicogenic headache: Secondary | ICD-10-CM | POA: Diagnosis not present

## 2021-01-31 ENCOUNTER — Other Ambulatory Visit: Payer: Self-pay | Admitting: Orthopedic Surgery

## 2021-01-31 DIAGNOSIS — M25551 Pain in right hip: Secondary | ICD-10-CM

## 2021-02-01 DIAGNOSIS — E559 Vitamin D deficiency, unspecified: Secondary | ICD-10-CM | POA: Diagnosis not present

## 2021-02-01 DIAGNOSIS — M810 Age-related osteoporosis without current pathological fracture: Secondary | ICD-10-CM | POA: Diagnosis not present

## 2021-02-03 ENCOUNTER — Ambulatory Visit
Admission: RE | Admit: 2021-02-03 | Discharge: 2021-02-03 | Disposition: A | Discharge status: Home / Self Care | Payer: Medicare Other | Source: Ambulatory Visit | Attending: Orthopedic Surgery | Admitting: Orthopedic Surgery

## 2021-02-03 DIAGNOSIS — M1611 Unilateral primary osteoarthritis, right hip: Secondary | ICD-10-CM | POA: Diagnosis not present

## 2021-02-03 DIAGNOSIS — M25751 Osteophyte, right hip: Secondary | ICD-10-CM | POA: Diagnosis not present

## 2021-02-03 DIAGNOSIS — M6258 Muscle wasting and atrophy, not elsewhere classified, other site: Secondary | ICD-10-CM | POA: Diagnosis not present

## 2021-02-03 DIAGNOSIS — M25551 Pain in right hip: Secondary | ICD-10-CM

## 2021-02-03 DIAGNOSIS — S72141D Displaced intertrochanteric fracture of right femur, subsequent encounter for closed fracture with routine healing: Secondary | ICD-10-CM | POA: Diagnosis not present

## 2021-02-13 DIAGNOSIS — F4320 Adjustment disorder, unspecified: Secondary | ICD-10-CM | POA: Diagnosis not present

## 2021-02-14 DIAGNOSIS — S72141K Displaced intertrochanteric fracture of right femur, subsequent encounter for closed fracture with nonunion: Secondary | ICD-10-CM | POA: Diagnosis not present

## 2021-03-13 ENCOUNTER — Telehealth: Payer: Self-pay

## 2021-03-13 ENCOUNTER — Other Ambulatory Visit: Payer: Self-pay | Admitting: Adult Health

## 2021-03-13 ENCOUNTER — Ambulatory Visit (INDEPENDENT_AMBULATORY_CARE_PROVIDER_SITE_OTHER): Payer: Medicare Other | Admitting: *Deleted

## 2021-03-13 ENCOUNTER — Other Ambulatory Visit: Payer: Self-pay

## 2021-03-13 VITALS — BP 130/78 | HR 79 | Temp 97.7°F | Resp 16 | Ht 64.5 in | Wt 115.0 lb

## 2021-03-13 DIAGNOSIS — R300 Dysuria: Secondary | ICD-10-CM

## 2021-03-13 DIAGNOSIS — F4320 Adjustment disorder, unspecified: Secondary | ICD-10-CM | POA: Diagnosis not present

## 2021-03-13 NOTE — Telephone Encounter (Signed)
Patient coming in for a nurse visit.

## 2021-03-13 NOTE — Progress Notes (Signed)
Patient is here for a NV to check a UA and a urine culture. Patient has had burning, frequency and urgency. Patient tried AZO,but has not taken any in 2 days. Symptoms worsened last PM.

## 2021-03-13 NOTE — Telephone Encounter (Signed)
Patient states that she is having a lot of urine frequency and burning. Wanting to come in for a nurse visit for a urine check for possible UTI. Please advise.

## 2021-03-14 LAB — URINALYSIS, ROUTINE W REFLEX MICROSCOPIC
Bacteria, UA: NONE SEEN /HPF
Bilirubin Urine: NEGATIVE
Glucose, UA: NEGATIVE
Hgb urine dipstick: NEGATIVE
Hyaline Cast: NONE SEEN /LPF
Ketones, ur: NEGATIVE
Nitrite: NEGATIVE
Protein, ur: NEGATIVE
Specific Gravity, Urine: 1.007 (ref 1.001–1.035)
Squamous Epithelial / HPF: NONE SEEN /HPF (ref <=5)
pH: 7 (ref 5.0–8.0)

## 2021-03-14 LAB — URINE CULTURE
MICRO NUMBER:: 11810551
SPECIMEN QUALITY:: ADEQUATE

## 2021-03-14 LAB — MICROSCOPIC MESSAGE

## 2021-03-22 ENCOUNTER — Other Ambulatory Visit: Payer: Self-pay | Admitting: Sports Medicine

## 2021-03-22 ENCOUNTER — Other Ambulatory Visit (HOSPITAL_COMMUNITY): Payer: Self-pay | Admitting: Sports Medicine

## 2021-03-22 DIAGNOSIS — M8889 Osteitis deformans of multiple sites: Secondary | ICD-10-CM

## 2021-03-22 DIAGNOSIS — M2550 Pain in unspecified joint: Secondary | ICD-10-CM | POA: Diagnosis not present

## 2021-03-22 DIAGNOSIS — M25571 Pain in right ankle and joints of right foot: Secondary | ICD-10-CM | POA: Diagnosis not present

## 2021-03-22 DIAGNOSIS — M1A09X0 Idiopathic chronic gout, multiple sites, without tophus (tophi): Secondary | ICD-10-CM | POA: Diagnosis not present

## 2021-03-22 DIAGNOSIS — R5383 Other fatigue: Secondary | ICD-10-CM | POA: Diagnosis not present

## 2021-03-22 DIAGNOSIS — M810 Age-related osteoporosis without current pathological fracture: Secondary | ICD-10-CM | POA: Diagnosis not present

## 2021-03-30 ENCOUNTER — Encounter (HOSPITAL_COMMUNITY): Payer: Medicare Other

## 2021-04-03 DIAGNOSIS — M47812 Spondylosis without myelopathy or radiculopathy, cervical region: Secondary | ICD-10-CM | POA: Diagnosis not present

## 2021-04-03 DIAGNOSIS — S72141K Displaced intertrochanteric fracture of right femur, subsequent encounter for closed fracture with nonunion: Secondary | ICD-10-CM | POA: Diagnosis not present

## 2021-04-04 ENCOUNTER — Ambulatory Visit: Admit: 2021-04-04 | Discharge: 2021-04-05 | Payer: MEDICARE | Attending: Internal Medicine | Primary: Internal Medicine

## 2021-04-04 DIAGNOSIS — K529 Noninfective gastroenteritis and colitis, unspecified: Secondary | ICD-10-CM | POA: Diagnosis not present

## 2021-04-06 ENCOUNTER — Encounter (HOSPITAL_COMMUNITY)
Admission: RE | Admit: 2021-04-06 | Discharge: 2021-04-06 | Disposition: A | Discharge status: Home / Self Care | Payer: Medicare Other | Source: Ambulatory Visit | Attending: Sports Medicine | Admitting: Sports Medicine

## 2021-04-06 ENCOUNTER — Other Ambulatory Visit: Payer: Self-pay

## 2021-04-06 DIAGNOSIS — S72141D Displaced intertrochanteric fracture of right femur, subsequent encounter for closed fracture with routine healing: Secondary | ICD-10-CM | POA: Diagnosis not present

## 2021-04-06 DIAGNOSIS — M8889 Osteitis deformans of multiple sites: Secondary | ICD-10-CM | POA: Insufficient documentation

## 2021-04-06 DIAGNOSIS — M25551 Pain in right hip: Secondary | ICD-10-CM | POA: Diagnosis not present

## 2021-04-06 MED ORDER — TECHNETIUM TC 99M MEDRONATE IV KIT
20.0000 | PACK | Freq: Once | INTRAVENOUS | Status: AC | PRN
  Administered 2021-04-06: 20 via INTRAVENOUS

## 2021-04-12 DIAGNOSIS — M810 Age-related osteoporosis without current pathological fracture: Secondary | ICD-10-CM | POA: Diagnosis not present

## 2021-04-12 DIAGNOSIS — M25571 Pain in right ankle and joints of right foot: Secondary | ICD-10-CM | POA: Diagnosis not present

## 2021-04-14 DIAGNOSIS — Z23 Encounter for immunization: Secondary | ICD-10-CM | POA: Diagnosis not present

## 2021-04-21 ENCOUNTER — Telehealth: Payer: Self-pay | Admitting: Nurse Practitioner

## 2021-04-21 NOTE — Telephone Encounter (Signed)
Scheduled appt per 6/3 sch msg. Pt aware.

## 2021-04-24 ENCOUNTER — Other Ambulatory Visit: Payer: Self-pay | Admitting: Internal Medicine

## 2021-04-27 ENCOUNTER — Inpatient Hospital Stay: Payer: Medicare Other

## 2021-04-27 ENCOUNTER — Encounter: Payer: Self-pay | Admitting: Nurse Practitioner

## 2021-04-27 ENCOUNTER — Other Ambulatory Visit: Payer: Self-pay

## 2021-04-27 ENCOUNTER — Other Ambulatory Visit: Payer: Self-pay | Admitting: Nurse Practitioner

## 2021-04-27 ENCOUNTER — Inpatient Hospital Stay: Payer: Medicare Other | Attending: Nurse Practitioner | Admitting: Nurse Practitioner

## 2021-04-27 VITALS — BP 123/77 | HR 67 | Temp 97.6°F | Resp 16 | Ht 64.5 in | Wt 110.8 lb

## 2021-04-27 DIAGNOSIS — R7989 Other specified abnormal findings of blood chemistry: Secondary | ICD-10-CM | POA: Insufficient documentation

## 2021-04-27 DIAGNOSIS — K589 Irritable bowel syndrome without diarrhea: Secondary | ICD-10-CM | POA: Diagnosis not present

## 2021-04-27 DIAGNOSIS — R748 Abnormal levels of other serum enzymes: Secondary | ICD-10-CM | POA: Diagnosis not present

## 2021-04-27 DIAGNOSIS — M25559 Pain in unspecified hip: Secondary | ICD-10-CM | POA: Diagnosis not present

## 2021-04-27 DIAGNOSIS — E785 Hyperlipidemia, unspecified: Secondary | ICD-10-CM | POA: Insufficient documentation

## 2021-04-27 DIAGNOSIS — Z85828 Personal history of other malignant neoplasm of skin: Secondary | ICD-10-CM | POA: Insufficient documentation

## 2021-04-27 DIAGNOSIS — T380X5A Adverse effect of glucocorticoids and synthetic analogues, initial encounter: Secondary | ICD-10-CM | POA: Insufficient documentation

## 2021-04-27 DIAGNOSIS — I10 Essential (primary) hypertension: Secondary | ICD-10-CM | POA: Diagnosis not present

## 2021-04-27 DIAGNOSIS — R771 Abnormality of globulin: Secondary | ICD-10-CM

## 2021-04-27 DIAGNOSIS — G629 Polyneuropathy, unspecified: Secondary | ICD-10-CM | POA: Diagnosis not present

## 2021-04-27 DIAGNOSIS — Z79899 Other long term (current) drug therapy: Secondary | ICD-10-CM | POA: Insufficient documentation

## 2021-04-27 DIAGNOSIS — Z882 Allergy status to sulfonamides status: Secondary | ICD-10-CM | POA: Insufficient documentation

## 2021-04-27 NOTE — Progress Notes (Signed)
[facility]   Telephone:([phone] [phone]   Clinic Follow up Note   Patient Care Team: Unk Pinto, MD as PCP - General (Internal Medicine) Jerrell Belfast, MD as Consulting Physician (Otolaryngology) Kristeen Miss, MD as Consulting Physician (Neurosurgery) Clarene Essex, MD as Consulting Physician (Gastroenterology) Mammography, [facility] (Diagnostic Radiology) 04/27/2021  CHIEF COMPLAINT: Follow-up abnormal CBC  CURRENT THERAPY: None  INTERVAL HISTORY: Ms. Gintz was initially seen by [place] on 11/23/2019 for myeloid abnormality on CBC which was felt to be secondary to steroid use for colitis flare.  She was referred back to [place] recently by orthopedist Dr. Wandra Feinstein for abnormal serum proteins in the setting of right hip fracture and osteoporosis, on Prolia, showing low total protein 5.8 and low gammaglobulin 0.4.   Today she presents to clinic by herself.  She continues to have ongoing pain and neuropathy from right femur and hip fracture, she is using a bone stimulator.  She continues Prolia every 6 months.  She otherwise denies changes in her overall health.  IBS is "still there" not on steroids.  She is planning to start infusions for diarrhea soon.  Denies unintentional weight loss, decreased appetite, fever, chills, cough, chest pain, dyspnea.  She has small bruises on her arms, on baby aspirin.   MEDICAL HISTORY:  Past Medical History:  Diagnosis Date   Arthritis    Benign labile hypertension    Cancer ([place])    skin cancer on face   Colitis    DJD (degenerative joint disease)    Endometrial polyp    GERD (gastroesophageal reflux disease)    History of basal cell carcinoma excision    History of esophagitis    HOH (hard of hearing)    right ear better than left  -- wears no aides   Hyperlipidemia    Hypothyroidism    Osteopenia    Prediabetes    Vitamin D deficiency    Wears glasses     SURGICAL HISTORY: Past Surgical History:   Procedure Laterality Date   BACK SURGERY  2020   CATARACT EXTRACTION, BILATERAL Bilateral 2020   DILATATION & CURETTAGE/HYSTEROSCOPY WITH MYOSURE N/A 10/27/2014   Procedure: DILATATION & CURETTAGE/HYSTEROSCOPY WITH MYOSURE;  Surgeon: Darlyn Chamber, MD;  Location: [facility];  Service: Gynecology;  Laterality: N/A;   DILATION AND CURETTAGE OF UTERUS     HIP SURGERY Right 04/2020   rod and screw in right hip    MOHS SURGERY  2005   LEFT NASAL BRIDGE FOR BASAL CELL   TONSILLECTOMY  age 80    I have reviewed the social history and family history with the patient and they are unchanged from previous note.  ALLERGIES:  is allergic to fosamax [alendronate sodium], singulair [montelukast sodium], wellbutrin [bupropion], and sulfa antibiotics.  MEDICATIONS:  Current Outpatient Medications  Medication Sig Dispense Refill   acetaminophen (TYLENOL) 500 MG tablet Take 500 mg by mouth at bedtime as needed for moderate pain or headache.      Ascorbic Acid (VITAMIN C) 1000 MG tablet Take 1,000 mg by mouth daily.      aspirin EC 81 MG tablet Take 81 mg by mouth daily.      bisoprolol-hydrochlorothiazide (ZIAC) 2.5-6.25 MG tablet Take  1 tablet  Daily  for BP 90 tablet 3   Calcium Carbonate-Vitamin D 600-400 MG-UNIT tablet Take 1 tablet by mouth daily.     Cholecalciferol 50 MCG (2000 UT) TABS Take by mouth.     Cinnamon  500 MG TABS Take 500 mg by mouth daily.      Coenzyme Q10 (COQ10) 200 MG CAPS Take 200 mg by mouth daily.     colestipol (COLESTID) 1 g tablet 2 g PO every day - take prior to lunch, at least 1 hour separate from other medications     dicyclomine (BENTYL) 20 MG tablet Take   1 tablet     3 x /day     before Meals 90 tablet 0   levothyroxine (SYNTHROID) 125 MCG tablet Take  1 tablet  Daily  on an empty stomach with only water for 30 minutes & no Antacid meds, Calcium or Magnesium for 4 hours & avoid Biotin 90 tablet 0   loratadine (CLARITIN) 10 MG tablet Take by mouth.      Multiple Vitamin (THERA VITAMIN PO) Take 1 tablet by mouth daily.     Omega-3 Fatty Acids (FISH OIL) 1200 MG CAPS Take 1,200 mg by mouth daily.      pantoprazole (PROTONIX) 40 MG tablet Take 1 tablet  2 x /day  for Heartburn & Indigestion 180 tablet 0   Polyethyl Glycol-Propyl Glycol 0.4-0.3 % SOLN Place 1 drop into both eyes at bedtime.     Probiotic Product (PROBIOTIC PO) Take 1 capsule by mouth daily.     PROLIA 60 MG/ML SOSY injection Inject 60 mg into the skin every 6 (six) months.     simvastatin (ZOCOR) 20 MG tablet TAKE 1 TABLET BY MOUTH AT BEDTIME FOR CHOLESTEROL 90 tablet 1   No current facility-administered medications for this visit.    PHYSICAL EXAMINATION:  Vitals:   04/27/21 1012  BP: 123/77  Pulse: 67  Resp: 16  Temp: 97.6 F (36.4 C)  SpO2: 99%   Filed Weights   04/27/21 1012  Weight: 110 lb 12.8 oz (50.3 kg)    GENERAL:alert, no distress and comfortable SKIN: Scattered small ecchymoses to bilateral forearms  EYES: sclera clear LUNGS: normal breathing effort HEART: no lower extremity edema NEURO: alert & oriented x 3 with fluent speech  LABORATORY DATA:  I have reviewed the data as listed CBC Latest Ref Rng & Units 11/23/2020 08/17/2020 05/25/2020  WBC 3.8 - 10.8 Thousand/uL 6.1 6.6 4.9  Hemoglobin 11.7 - 15.5 g/dL 13.4 13.0 10.9(L)  Hematocrit 35.0 - 45.0 % 38.8 39.2 34.3(L)  Platelets 140 - 400 Thousand/uL 200 239 468(H)     CMP Latest Ref Rng & Units 11/23/2020 08/17/2020 05/25/2020  Glucose 65 - 99 mg/dL 134(H) 83 104(H)  BUN 7 - 25 mg/dL 12 10 13   Creatinine 0.60 - 0.93 mg/dL 0.89 0.74 0.86  Sodium 135 - 146 mmol/L 132(L) 137 135  Potassium 3.5 - 5.3 mmol/L 3.6 4.3 4.1  Chloride 98 - 110 mmol/L 98 101 103  CO2 20 - 32 mmol/L 23 28 26   Calcium 8.6 - 10.4 mg/dL 9.2 9.5 8.9  Total Protein 6.1 - 8.1 g/dL 5.9(L) 6.0(L) 5.7(L)  Total Bilirubin 0.2 - 1.2 mg/dL 0.5 0.4 0.9  Alkaline Phos 33 - 130 U/L - - -  AST 10 - 35 U/L 22 19 20   ALT 6 - 29 U/L 19 14  15       RADIOGRAPHIC STUDIES: I have personally reviewed the radiological images as listed and agreed with the findings in the report. No results found.   ASSESSMENT & PLAN: 80 yo female   Hypoglobulinemia -we initially saw Ms. Candee in 11/2019 for elevated myelocytes, felt to be secondary to steroids. Repeat CBC was  normal  -More recently she fell and subsequently has delayed healing from a traumatic right hip fracture, requiring surgery and bone stimulation.  -work up including CT/bone scan were negative for osseous lesion. Labs showed normal CBC and inflammatory markers but showed mildly low total protein and gamma globulin -exam is benign -discussed in [facility] we usually see elevated serum proteins. Will repeat SPEP w/ IFE today. No other heme work up is being recommended at this time.  -will f/up with results and send note to Dr. Layne Benton.  -f/up as needed in the future   Disposition:  Ms. Iannello appears stable. She has a non healing traumatic right hip fracture and osteoporosis, on Prolia. Work up showed low total protein and gamma globulin, normal CBC, Scr, and inflammatory markers. CT hip and bone scan were negative for bone lesion. In [facility] the protein markers are elevated. Overall I do not have suspicion for hematologic abnormality. Will repeat SPEP and IFE to confirm.    We will f/up on today's labs. I will cc my note to Dr. Layne Benton. From a hematology standpoint, OK to proceed with Dr. Kathrin Penner treatment plan for hip fracture and osteoporosis.  F/up open, as needed in the future.   The plan was discussed with Dr. Burr Medico.  Orders Placed This Encounter  Procedures   SPEP with reflex to IFE    Standing Status:   Future    Number of Occurrences:   1    Standing Expiration Date:   04/27/2022    All questions were answered. The patient knows to call the clinic with any problems, questions or concerns. No barriers to learning were detected. Total encounter time was 30 minutes.       Alla Feeling, NP 04/27/21

## 2021-04-27 NOTE — Progress Notes (Signed)
Error

## 2021-04-28 LAB — PROTEIN ELECTROPHORESIS, SERUM, WITH REFLEX
A/G Ratio: 2.2 — ABNORMAL HIGH (ref 0.7–1.7)
Albumin ELP: 4 g/dL (ref 2.9–4.4)
Alpha-1-Globulin: 0.2 g/dL (ref 0.0–0.4)
Alpha-2-Globulin: 0.6 g/dL (ref 0.4–1.0)
Beta Globulin: 0.7 g/dL (ref 0.7–1.3)
Gamma Globulin: 0.3 g/dL — ABNORMAL LOW (ref 0.4–1.8)
Globulin, Total: 1.8 g/dL — ABNORMAL LOW (ref 2.2–3.9)
Total Protein ELP: 5.8 g/dL — ABNORMAL LOW (ref 6.0–8.5)

## 2021-05-08 DIAGNOSIS — F4320 Adjustment disorder, unspecified: Secondary | ICD-10-CM | POA: Diagnosis not present

## 2021-05-09 DIAGNOSIS — G4486 Cervicogenic headache: Secondary | ICD-10-CM | POA: Diagnosis not present

## 2021-05-09 DIAGNOSIS — M47812 Spondylosis without myelopathy or radiculopathy, cervical region: Secondary | ICD-10-CM | POA: Diagnosis not present

## 2021-05-11 DIAGNOSIS — K51919 Ulcerative colitis, unspecified with unspecified complications: Secondary | ICD-10-CM | POA: Diagnosis not present

## 2021-05-12 ENCOUNTER — Encounter: Payer: Self-pay | Admitting: Adult Health

## 2021-05-12 DIAGNOSIS — Z681 Body mass index (BMI) 19 or less, adult: Secondary | ICD-10-CM | POA: Insufficient documentation

## 2021-05-12 NOTE — Progress Notes (Signed)
CPE  Assessment:   Encounter for Annual Physical Exam with abnormal findings Due annually   Essential hypertension - stop ziac, start losartan -sent in 50 mg tabs, start 1/2 tab daily x 2 weeks, increase to whole tab in 2 weeks if needed if BP above 140/80 or PRN for labile high values, BP recheck with BMP/GFR in 4 weeks - DASH diet, exercise and monitor at home. Call if greater than 140/80.  -     CBC with Differential/Platelet -     COMPLETE METABOLIC PANEL WITH GFR -     Magnesium -     UA/Micro -     EKG  CRBBB - Will stop BB, monitor;   Uncomplicated asthma, unspecified asthma severity, unspecified whether persistent Doing well on current regimen, continue with benefit Continue to monitor  GERD Continue PPI/H2 blocker, diet discussed -     Magnesium  Hypothyroidism, unspecified type continue medications the same pending lab results reminded to take on an empty stomach 30-58mns before food.  check TSH level - TSH  Osteoarthritis, unspecified osteoarthritis type, unspecified site Doing well, tylenol PRN, Dr. CFrench Anafollows  Osteopenia of multiple sites Continue Calcium, Vit D Dr. BLayne Bentonfollowing for prolia, had DEXA Feb 2022 report requested Weight bearing exercises encouraged  Spondylolisthesis at L4-L5 level S/P lumbar fusion.  Doing well Following with Dr EEllene Route Mixed hyperlipidemia Continue current regimen Discussed dietary and exercise modifications  -     Lipid panel -     TSH -     CMP/GFR  Vitamin D deficiency Continue supplement;  Will not check level at CPE due to cost  Vertigo Monitor; meclizine PRN if needed  TMJ pain Avoid gum, hard foods, heat may help, follow up with dentist; did see ENT  Colitis [facility] following; continue colestipol, PPI, newly on entyvio and tolerated well   BMI 19-19.9 Stable weight Continue to recommend diet heavy in fruits and veggies and low in animal meats, cheeses, and dairy products, appropriate  calorie intake Discuss exercise recommendations routinely Continue to monitor weight at each visit Mild weight gain encouraged  No orders of the defined types were placed in this encounter.  Over 40 minutes of exam, counseling, chart review and critical decision making was performed Future Appointments  Date Time Provider [place] 08/08/2021 10:45 AM DBo Merino MD [facility] None  08/21/2021  9:30 AM CLiane Comber NP [facility] None  05/15/2022  2:30 PM MUnk Pinto MD [facility] None     Plan:   During the course of the visit the patient was educated and counseled about appropriate screening and preventive services including:   Pneumococcal vaccine  Prevnar 13 Influenza vaccine Td vaccine Screening electrocardiogram Bone densitometry screening Colorectal cancer screening Diabetes screening Glaucoma screening Nutrition counseling  Advanced directives: requested   Subjective:  Debbie RAWEis a 80y.o. female who presents for CPE. She has Essential hypertension; Hyperlipidemia, mixed; Hypothyroid; GERD; Asthma; DJD (degenerative joint disease); Vitamin D deficiency; Osteopenia; Allergy; Medication management; Colitis; Spondylolisthesis at L4-L5 level; Vertigo; Bilateral temporomandibular joint pain; Senile purpura ([place]; Bilateral nephrolithiasis; and Adult BMI <19 kg/sq m on their problem list.  She is married, 2 children, 1 grandchild. She is a retired kParamedic   She denies new concerns today.   She had fall with R hip/fremur fracture in June 2021 on vacation at the beach, had IM nail placed, now following with Dr. CFrench Analocally, had non-union, following with Dr. HDoreatha Martinand doing ? Topical bone  stimulator device. Takes occasional tylenol PRN, uses heating pad.   She had L4-L5 Decompression and stabilization by Dr Ellene Route which she tolerated well on 11-25-18, taking gabapentin for 300 mg just in the evening, also 2 tab extra strength tylenol  in the evening which works well for her.   She had a DEXA 12/2018 that showed left femur with T -2.3, she can not tolerate fosamax, now following with Dr. Layne Benton on prolia, had recently and tolerating well.   She has had vertigo, TMJ pain, some mildly asymmetric hearing, saw ENT and now has hearing aids.   She has colitis/inflammatory bowel disease followed by [facility]. She has been advised to continue colestipol, protonix BID, and recently discussed starting entyvio at local infusion center, had first dose last week and reports tolerated well.   BMI is Body mass index is 19.33 kg/m., she has been working on diet and exercise, less with hip, plans to restart water aerobics. Rare alcohol. Admits to sweet tea, several glasses, some water.  Wt Readings from Last 3 Encounters:  05/15/21 110 lb (49.9 kg)  04/27/21 110 lb 12.8 oz (50.3 kg)  03/13/21 115 lb (52.2 kg)    Her blood pressure has been controlled at home (reports labile, tends to run high in AM has been systolic ~017B), today their BP is BP: (!) 104/58 She does workout. She denies chest pain, shortness of breath. She does endorse sitting to standing dizziness that she correlates to lower BPs. Taking ziac 2.5/6.25 mg in the morning.   She is on cholesterol medication (simvastatin 20 mg, cholestipol 2 g daily) and denies myalgias. Her cholesterol is at goal. The cholesterol last visit was:   Lab Results  Component Value Date   CHOL 160 11/23/2020   HDL 64 11/23/2020   LDLCALC 79 11/23/2020   TRIG 85 11/23/2020   CHOLHDL 2.5 11/23/2020   . She has been working on diet and exercise for glucose management. She denies diabetic polys. Last A1C in the office was:  Lab Results  Component Value Date   HGBA1C 5.4 02/03/2020   CKD II range monitored at this office. Last GFR: Lab Results  Component Value Date   GFRNONAA 62 11/23/2020   She is on thyroid medication. Her medication was changed last visit, patient reports 125 mcg, ? 1/2  Tues/Thurs/Sat/Sun, whole tab all other days- has written at home.  Lab Results  Component Value Date   TSH 2.58 11/23/2020   Patient is on Vitamin D supplement.   Lab Results  Component Value Date   VD25OH 47 11/23/2020        Medication Review: Current Outpatient Medications on File Prior to Visit  Medication Sig Dispense Refill   acetaminophen (TYLENOL) 500 MG tablet Take 500 mg by mouth at bedtime as needed for moderate pain or headache.      Ascorbic Acid (VITAMIN C) 1000 MG tablet Take 1,000 mg by mouth daily.      aspirin EC 81 MG tablet Take 81 mg by mouth daily.      bisoprolol-hydrochlorothiazide (ZIAC) 2.5-6.25 MG tablet Take  1 tablet  Daily  for BP 90 tablet 3   Calcium Carbonate-Vitamin D 600-400 MG-UNIT tablet Take 1 tablet by mouth daily.     Cholecalciferol 50 MCG (2000 UT) TABS Take by mouth.     Cinnamon 500 MG TABS Take 500 mg by mouth daily.      Coenzyme Q10 (COQ10) 200 MG CAPS Take 200 mg by mouth daily.  colestipol (COLESTID) 1 g tablet 2 g PO every day - take prior to lunch, at least 1 hour separate from other medications     dicyclomine (BENTYL) 20 MG tablet Take   1 tablet     3 x /day     before Meals 90 tablet 0   levothyroxine (SYNTHROID) 125 MCG tablet Take  1 tablet  Daily  on an empty stomach with only water for 30 minutes & no Antacid meds, Calcium or Magnesium for 4 hours & avoid Biotin 90 tablet 0   loratadine (CLARITIN) 10 MG tablet Take by mouth.     Multiple Vitamin (THERA VITAMIN PO) Take 1 tablet by mouth daily.     Omega-3 Fatty Acids (FISH OIL) 1200 MG CAPS Take 1,200 mg by mouth daily.      pantoprazole (PROTONIX) 40 MG tablet Take 1 tablet  2 x /day  for Heartburn & Indigestion 180 tablet 0   Polyethyl Glycol-Propyl Glycol 0.4-0.3 % SOLN Place 1 drop into both eyes at bedtime.     Probiotic Product (PROBIOTIC PO) Take 1 capsule by mouth daily.     PROLIA 60 MG/ML SOSY injection Inject 60 mg into the skin every 6 (six) months.      simvastatin (ZOCOR) 20 MG tablet TAKE 1 TABLET BY MOUTH AT BEDTIME FOR CHOLESTEROL 90 tablet 1   Vedolizumab (ENTYVIO IV) Inject into the vein.     No current facility-administered medications on file prior to visit.    Allergies  Allergen Reactions   Fosamax [Alendronate Sodium] Other (See Comments)    GI upset    Singulair [Montelukast Sodium] Other (See Comments)    Makes pt jittery   Wellbutrin [Bupropion] Hives   Sulfa Antibiotics Rash    Current Problems (verified) Patient Active Problem List   Diagnosis Date Noted   Adult BMI <19 kg/sq m 05/12/2021   Bilateral nephrolithiasis 09/21/2020   Senile purpura ([place]) 08/17/2020   Bilateral temporomandibular joint pain 06/15/2020   Vertigo 03/25/2019   Spondylolisthesis at L4-L5 level 11/25/2018   Colitis 12/21/2014   Medication management 04/20/2014   Essential hypertension    Hyperlipidemia, mixed    Hypothyroid    GERD    Asthma    DJD (degenerative joint disease)    Vitamin D deficiency    Osteopenia    Allergy     Screening Tests Immunization History  Administered Date(s) Administered   Influenza, High Dose Seasonal PF 07/23/2014, 08/23/2015, 07/10/2016, 08/27/2017, 08/22/2018, 08/25/2019, 08/30/2020   PFIZER(Purple Top)SARS-COV-2 Vaccination 12/10/2019, 12/31/2019, 08/16/2020   Pneumococcal Conjugate-13 05/05/2015   Pneumococcal-Unspecified 08/22/2011   Tdap 08/22/2011   Zoster Recombinat (Shingrix) 09/13/2017, 01/01/2018   Zoster, Live 06/20/2006     Preventative Care: Last colonoscopy: 2020, Dr. Watt Climes, normal  Upper GI: 2017  Last mammogram: 12/2020, neg, Hx Breast Cx. Last pap smear/pelvic exam: 2012 DEXA: 12/2018, fem T-2.3, stable, - reports had 12/26/2020  by Dr. Layne Benton  Prior vaccinations: TD or Tdap: 2012, DUE 08/2021 - cos, will boost PRN Influenza: 2021 Pneumococcal: 2012 Prevnar13: 2016 Shingrix: 2/2, 2019 Covid 19: 2/2, 2021, pfizer + 2 boosters - send second shot date  Names of Other  Physician/Practitioners you currently use: 1. [facility] here for primary care 2.Eye Exam 2021 3. Dentist 2021, q50m  Patient Care Team: MUnk Pinto MD as PCP - General (Internal Medicine) SJerrell Belfast MD as Consulting Physician (Otolaryngology) EKristeen Miss MD as Consulting Physician (Neurosurgery) MClarene Essex MD as Consulting Physician (  Gastroenterology) Mammography, [facility] (Diagnostic Radiology)  SURGICAL HISTORY She  has a past surgical history that includes Mohs surgery (2005); Tonsillectomy (age 49); Dilation and curettage of uterus; Dilatation & curettage/hysteroscopy with myosure (N/A, 10/27/2014); Back surgery (2020); Hip surgery (Right, 04/2020); and Cataract extraction, bilateral (Bilateral, 2020). FAMILY HISTORY Her family history includes Autism in her son; Autism spectrum disorder in her son; Cancer in her father, maternal grandmother, mother, and sister; Colitis in her son; Colon cancer (age of onset: 20) in her brother; Diabetes in her brother and maternal grandfather; Healthy in her son; Hyperlipidemia in her brother, father, maternal grandfather, maternal grandmother, mother, paternal grandfather, paternal grandmother, and sister; Hypertension in her brother and mother; Stroke in her maternal grandfather. SOCIAL HISTORY She  reports that she quit smoking about 20 years ago. Her smoking use included cigarettes. She has never used smokeless tobacco. She reports current alcohol use. She reports that she does not use drugs.   Review of Systems  Constitutional:  Negative for malaise/fatigue and weight loss.  HENT:  Negative for hearing loss (has hearing aids) and tinnitus.   Eyes:  Negative for blurred vision and double vision.  Respiratory:  Negative for cough, sputum production, shortness of breath and wheezing.   Cardiovascular:  Negative for chest pain, palpitations, orthopnea, claudication, leg swelling and PND.   Gastrointestinal:  Negative for abdominal pain, blood in stool, constipation, diarrhea, heartburn, melena, nausea and vomiting.  Genitourinary: Negative.   Musculoskeletal:  Negative for falls, joint pain and myalgias.  Skin:  Negative for rash.  Neurological:  Positive for dizziness (with position changes). Negative for tingling, sensory change, weakness and headaches.  Endo/Heme/Allergies:  Negative for polydipsia.  Psychiatric/Behavioral: Negative.  Negative for depression, memory loss, substance abuse and suicidal ideas. The patient is not nervous/anxious and does not have insomnia.   All other systems reviewed and are negative.   Objective:     Today's Vitals   05/15/21 1411  BP: (!) 104/58  Pulse: 74  Temp: (!) 97.5 F (36.4 C)  SpO2: 98%  Weight: 110 lb (49.9 kg)  Height: 5' 3.25" (1.607 m)   Body mass index is 19.33 kg/m.  General appearance: alert, no distress, WD/WN, thin elder female HEENT: normocephalic, sclerae anicteric, TMs pearly, nares patent, no discharge or erythema, pharynx normal Oral cavity: MMM, no lesions Neck: supple, no lymphadenopathy, no thyromegaly, no masses Heart: RRR, normal S1, S2, no murmurs Lungs: CTA bilaterally, no wheezes, rhonchi, or rales Abdomen: +bs, soft, non tender, non distended, no masses, no hepatomegaly, no splenomegaly Musculoskeletal: nontender, no swelling, no obvious deformity Extremities: no edema, no cyanosis, no clubbing Pulses: 2+ symmetric, upper and lower extremities, normal cap refill Neurological: alert, oriented x 3, CN2-12 intact, strength normal upper extremities and lower extremities, sensation normal throughout, DTRs 2+ throughout, no cerebellar signs, gait slow steady with cane  Psychiatric: normal affect, behavior normal, pleasant  Derm: no rashes, concerning lesions; has scattered small ecchymosis to bil upper extremities Breasts: defer, recent normal mammogram, patient declined GU: defer  EKG: Complete  RBBB, had on last in 2020  The patient's weight, height, BMI have been recorded in the chart.  I have made referrals, counseling, and provided education to the patient based on review of the above and I have provided the patient with a written personalized care plan for preventive services.     Izora Ribas, NP   05/15/2021

## 2021-05-15 ENCOUNTER — Other Ambulatory Visit: Payer: Self-pay | Admitting: Adult Health

## 2021-05-15 ENCOUNTER — Other Ambulatory Visit: Payer: Self-pay

## 2021-05-15 ENCOUNTER — Ambulatory Visit (INDEPENDENT_AMBULATORY_CARE_PROVIDER_SITE_OTHER): Payer: Medicare Other | Admitting: Adult Health

## 2021-05-15 ENCOUNTER — Encounter: Payer: Self-pay | Admitting: Adult Health

## 2021-05-15 VITALS — BP 104/58 | HR 74 | Temp 97.5°F | Ht 63.25 in | Wt 110.0 lb

## 2021-05-15 DIAGNOSIS — I4510 Unspecified right bundle-branch block: Secondary | ICD-10-CM | POA: Diagnosis not present

## 2021-05-15 DIAGNOSIS — K529 Noninfective gastroenteritis and colitis, unspecified: Secondary | ICD-10-CM

## 2021-05-15 DIAGNOSIS — Z0000 Encounter for general adult medical examination without abnormal findings: Secondary | ICD-10-CM

## 2021-05-15 DIAGNOSIS — Z681 Body mass index (BMI) 19 or less, adult: Secondary | ICD-10-CM

## 2021-05-15 DIAGNOSIS — Z1389 Encounter for screening for other disorder: Secondary | ICD-10-CM

## 2021-05-15 DIAGNOSIS — I10 Essential (primary) hypertension: Secondary | ICD-10-CM | POA: Diagnosis not present

## 2021-05-15 DIAGNOSIS — D692 Other nonthrombocytopenic purpura: Secondary | ICD-10-CM

## 2021-05-15 DIAGNOSIS — E559 Vitamin D deficiency, unspecified: Secondary | ICD-10-CM

## 2021-05-15 DIAGNOSIS — M1990 Unspecified osteoarthritis, unspecified site: Secondary | ICD-10-CM

## 2021-05-15 DIAGNOSIS — M8589 Other specified disorders of bone density and structure, multiple sites: Secondary | ICD-10-CM

## 2021-05-15 DIAGNOSIS — J45909 Unspecified asthma, uncomplicated: Secondary | ICD-10-CM | POA: Diagnosis not present

## 2021-05-15 DIAGNOSIS — K2100 Gastro-esophageal reflux disease with esophagitis, without bleeding: Secondary | ICD-10-CM | POA: Diagnosis not present

## 2021-05-15 DIAGNOSIS — R42 Dizziness and giddiness: Secondary | ICD-10-CM

## 2021-05-15 DIAGNOSIS — Z136 Encounter for screening for cardiovascular disorders: Secondary | ICD-10-CM | POA: Diagnosis not present

## 2021-05-15 DIAGNOSIS — E039 Hypothyroidism, unspecified: Secondary | ICD-10-CM

## 2021-05-15 DIAGNOSIS — E782 Mixed hyperlipidemia: Secondary | ICD-10-CM

## 2021-05-15 DIAGNOSIS — Z79899 Other long term (current) drug therapy: Secondary | ICD-10-CM | POA: Diagnosis not present

## 2021-05-15 DIAGNOSIS — R7309 Other abnormal glucose: Secondary | ICD-10-CM

## 2021-05-15 MED ORDER — LOSARTAN POTASSIUM 50 MG PO TABS: ORAL_TABLET | ORAL | 0 refills | Status: DC

## 2021-05-15 MED ORDER — BISOPROLOL FUMARATE 5 MG PO TABS: ORAL_TABLET | ORAL | 0 refills | Status: DC

## 2021-05-15 NOTE — Patient Instructions (Addendum)
STOP bisoprolol hydrochlorothiazide that you take in the morning  Start just plain bisoprolol - 1/2 tab of 5 mg in the evening - keep a log, let me know if still running too low or getting very high and not coming down  Want average around 130/70  Send date for covid 19 booster #2        Bisoprolol Tablets What is this medication? BISOPROLOL (bis [place] proe lol) treats high blood pressure. It works by lowering your blood pressure and heart rate, making it easier for your heart to pump blood to the rest of your body. It belongs to a group of medications calledbeta blockers. This medicine may be used for other purposes; ask your health care provider orpharmacist if you have questions. COMMON BRAND NAME(S): Zebeta What should I tell my care team before I take this medication? They need to know if you have any of these conditions: Chest pain Diabetes Heart or vessel disease like slow heart rate, worsening heart failure, heart block, sick sinus syndrome, or Raynaud's disease Kidney disease Liver disease Lung or breathing disease, like asthma or emphysema Pheochromocytoma Thyroid disease An unusual or allergic reaction to bisoprolol, other beta blockers, medications, foods, dyes, or preservatives Pregnant or trying to get pregnant Breast-feeding How should I use this medication? Take this medication by mouth. Take it as directed on the prescription label at the same time every day. You can take it with or without food. If it upsets your stomach, take it with food. Keep taking it unless your care team tells youto stop. Talk to your care team about the use of this medication in children. Specialcare may be needed. Overdosage: If you think you have taken too much of this medicine contact apoison control center or emergency room at once. NOTE: This medicine is only for you. Do not share this medicine with others. What if I miss a dose? If you miss a dose, take it as soon as you can. If it is  almost time for yournext dose, take only that dose. Do not take double or extra doses. What may interact with this medication? This medication may interact with the following: Certain medications for blood pressure, heart disease, irregular heartbeat NSAIDs, medications for pain and inflammation, like ibuprofen or naproxen Rifampin This list may not describe all possible interactions. Give your health care provider a list of all the medicines, herbs, non-prescription drugs, or dietary supplements you use. Also tell them if you smoke, drink alcohol, or use illegaldrugs. Some items may interact with your medicine. What should I watch for while using this medication? Visit your care team for regular checks on your progress. Check your blood pressure as directed. Ask your care team what your blood pressure should be.Also, find out when you should contact them. Do not treat yourself for coughs, colds, or pain while you are using this medication without asking your care team for advice. Some medications mayincrease your blood pressure. You may get drowsy or dizzy. Do not drive, use machinery, or do anything that needs mental alertness until you know how this medication affects you. Do not stand up or sit up quickly, especially if you are an older patient. This reduces the risk of dizzy or fainting spells. Alcohol may interfere with theeffect of this medication. Avoid alcoholic drinks. This medication may increase blood sugar. Ask your care team if changes in dietor medications are needed if you have diabetes. What side effects may I notice from receiving this medication? Side effects  that you should report to your care team as soon as possible: Allergic reactions-skin rash, itching, hives, swelling of the face, lips, tongue, or throat Heart failure-shortness of breath, swelling of the ankles, feet, or hands, sudden weight gain, unusual weakness or fatigue Low blood pressure-dizziness, feeling faint or  lightheaded, blurry vision Raynaud's-cool, numb, or painful fingers or toes that may change color from pale, to blue, to red Slow heartbeat-dizziness, feeling faint or lightheaded, confusion, trouble breathing, unusual weakness or fatigue Worsening mood, feelings of depression Side effects that usually do not require medical attention (report to your careteam if they continue or are bothersome): Change in sex drive or performance Diarrhea Dizziness Fatigue Headache This list may not describe all possible side effects. Call your doctor for medical advice about side effects. You may report side effects to FDA at1-800-FDA-1088. Where should I keep my medication? Keep out of the reach of children and pets. Store at room temperature between 20 and 25 degrees C (68 and 77 degrees F). Protect from light and moisture. Keep the container tightly closed. Throw awayany unused medication after the expiration date. NOTE: This sheet is a summary. It may not cover all possible information. If you have questions about this medicine, talk to your doctor, pharmacist, orhealth care provider.  2022 Elsevier/Gold Standard (2020-12-08 11:14:18)

## 2021-05-16 ENCOUNTER — Other Ambulatory Visit: Payer: Self-pay | Admitting: Adult Health

## 2021-05-16 DIAGNOSIS — E039 Hypothyroidism, unspecified: Secondary | ICD-10-CM

## 2021-05-16 DIAGNOSIS — Z20822 Contact with and (suspected) exposure to covid-19: Secondary | ICD-10-CM | POA: Diagnosis not present

## 2021-05-16 LAB — URINALYSIS, ROUTINE W REFLEX MICROSCOPIC
Bacteria, UA: NONE SEEN /HPF
Bilirubin Urine: NEGATIVE
Glucose, UA: NEGATIVE
Hgb urine dipstick: NEGATIVE
Ketones, ur: NEGATIVE
Nitrite: NEGATIVE
Protein, ur: NEGATIVE
RBC / HPF: NONE SEEN /HPF (ref 0–2)
Specific Gravity, Urine: 1.009 (ref 1.001–1.035)
Squamous Epithelial / HPF: NONE SEEN /HPF (ref <=5)
pH: 6.5 (ref 5.0–8.0)

## 2021-05-16 LAB — CBC WITH DIFFERENTIAL/PLATELET
Absolute Monocytes: 508 cells/uL (ref 200–950)
Basophils Absolute: 31 cells/uL (ref 0–200)
Basophils Relative: 0.5 %
Eosinophils Absolute: 50 cells/uL (ref 15–500)
Eosinophils Relative: 0.8 %
HCT: 38.8 % (ref 35.0–45.0)
Hemoglobin: 13.4 g/dL (ref 11.7–15.5)
Lymphs Abs: 1339 cells/uL (ref 850–3900)
MCH: 33.8 pg — ABNORMAL HIGH (ref 27.0–33.0)
MCHC: 34.5 g/dL (ref 32.0–36.0)
MCV: 98 fL (ref 80.0–100.0)
MPV: 9.9 fL (ref 7.5–12.5)
Monocytes Relative: 8.2 %
Neutro Abs: 4272 cells/uL (ref 1500–7800)
Neutrophils Relative %: 68.9 %
Platelets: 243 10*3/uL (ref 140–400)
RBC: 3.96 10*6/uL (ref 3.80–5.10)
RDW: 13.2 % (ref 11.0–15.0)
Total Lymphocyte: 21.6 %
WBC: 6.2 10*3/uL (ref 3.8–10.8)

## 2021-05-16 LAB — COMPLETE METABOLIC PANEL WITH GFR
AG Ratio: 2.3 (calc) (ref 1.0–2.5)
ALT: 19 U/L (ref 6–29)
AST: 21 U/L (ref 10–35)
Albumin: 4.2 g/dL (ref 3.6–5.1)
Alkaline phosphatase (APISO): 50 U/L (ref 37–153)
BUN: 13 mg/dL (ref 7–25)
CO2: 27 mmol/L (ref 20–32)
Calcium: 9.3 mg/dL (ref 8.6–10.4)
Chloride: 100 mmol/L (ref 98–110)
Creat: 0.76 mg/dL (ref 0.60–0.93)
GFR, Est African American: 86 mL/min/{1.73_m2} (ref >=60)
GFR, Est Non African American: 75 mL/min/{1.73_m2} (ref >=60)
Globulin: 1.8 g/dL (calc) — ABNORMAL LOW (ref 1.9–3.7)
Glucose, Bld: 135 mg/dL — ABNORMAL HIGH (ref 65–99)
Potassium: 4.3 mmol/L (ref 3.5–5.3)
Sodium: 134 mmol/L — ABNORMAL LOW (ref 135–146)
Total Bilirubin: 0.4 mg/dL (ref 0.2–1.2)
Total Protein: 6 g/dL — ABNORMAL LOW (ref 6.1–8.1)

## 2021-05-16 LAB — HEMOGLOBIN A1C
Hgb A1c MFr Bld: 5.5 % of total Hgb (ref <5.7)
Mean Plasma Glucose: 111 mg/dL
eAG (mmol/L): 6.2 mmol/L

## 2021-05-16 LAB — MICROALBUMIN / CREATININE URINE RATIO
Creatinine, Urine: 42 mg/dL (ref 20–275)
Microalb Creat Ratio: 7 mcg/mg creat (ref <30)
Microalb, Ur: 0.3 mg/dL

## 2021-05-16 LAB — LIPID PANEL
Cholesterol: 158 mg/dL (ref <200)
HDL: 63 mg/dL (ref >=50)
LDL Cholesterol (Calc): 76 mg/dL (calc)
Non-HDL Cholesterol (Calc): 95 mg/dL (calc) (ref <130)
Total CHOL/HDL Ratio: 2.5 (calc) (ref <5.0)
Triglycerides: 108 mg/dL (ref <150)

## 2021-05-16 LAB — MAGNESIUM: Magnesium: 2 mg/dL (ref 1.5–2.5)

## 2021-05-16 LAB — IRON,TIBC AND FERRITIN PANEL
%SAT: 21 % (calc) (ref 16–45)
Ferritin: 61 ng/mL (ref 16–288)
Iron: 68 ug/dL (ref 45–160)
TIBC: 325 mcg/dL (calc) (ref 250–450)

## 2021-05-16 LAB — TSH: TSH: 0.18 mIU/L — ABNORMAL LOW (ref 0.40–4.50)

## 2021-05-16 LAB — MICROSCOPIC MESSAGE

## 2021-05-25 DIAGNOSIS — K51919 Ulcerative colitis, unspecified with unspecified complications: Secondary | ICD-10-CM | POA: Diagnosis not present

## 2021-05-26 ENCOUNTER — Other Ambulatory Visit: Payer: Self-pay

## 2021-05-26 ENCOUNTER — Encounter (HOSPITAL_BASED_OUTPATIENT_CLINIC_OR_DEPARTMENT_OTHER): Payer: Self-pay | Admitting: Emergency Medicine

## 2021-05-26 ENCOUNTER — Emergency Department (HOSPITAL_BASED_OUTPATIENT_CLINIC_OR_DEPARTMENT_OTHER): Payer: Medicare Other

## 2021-05-26 ENCOUNTER — Emergency Department (HOSPITAL_BASED_OUTPATIENT_CLINIC_OR_DEPARTMENT_OTHER)
Admission: EM | Admit: 2021-05-26 | Discharge: 2021-05-26 | Disposition: A | Discharge status: Home / Self Care | Payer: Medicare Other | Attending: Emergency Medicine | Admitting: Emergency Medicine

## 2021-05-26 DIAGNOSIS — I10 Essential (primary) hypertension: Secondary | ICD-10-CM | POA: Diagnosis not present

## 2021-05-26 DIAGNOSIS — Z85828 Personal history of other malignant neoplasm of skin: Secondary | ICD-10-CM | POA: Diagnosis not present

## 2021-05-26 DIAGNOSIS — Z87891 Personal history of nicotine dependence: Secondary | ICD-10-CM | POA: Insufficient documentation

## 2021-05-26 DIAGNOSIS — Z7982 Long term (current) use of aspirin: Secondary | ICD-10-CM | POA: Insufficient documentation

## 2021-05-26 DIAGNOSIS — Z79899 Other long term (current) drug therapy: Secondary | ICD-10-CM | POA: Insufficient documentation

## 2021-05-26 DIAGNOSIS — M7989 Other specified soft tissue disorders: Secondary | ICD-10-CM | POA: Diagnosis present

## 2021-05-26 DIAGNOSIS — E039 Hypothyroidism, unspecified: Secondary | ICD-10-CM | POA: Insufficient documentation

## 2021-05-26 DIAGNOSIS — M25561 Pain in right knee: Secondary | ICD-10-CM | POA: Insufficient documentation

## 2021-05-26 DIAGNOSIS — R600 Localized edema: Secondary | ICD-10-CM | POA: Diagnosis not present

## 2021-05-26 NOTE — ED Notes (Signed)
Patient verbalizes understanding of discharge instructions. Opportunity for questioning and answers were provided. Patient discharged from ED.  °

## 2021-05-26 NOTE — ED Provider Notes (Signed)
[facility] Provider Note  CSN: [id number] Arrival date & time: 05/26/21 1026    History Chief Complaint  Patient presents with   Leg Swelling    Debbie Houston is a 80 y.o. female with history of arthritis and prior R hip/femur fracture ORIF reports she has had pain and pressure behind the R knee since yesterday. No falls or injury. PCP advised her to come here to rule out DVT.    Past Medical History:  Diagnosis Date   Arthritis    Benign labile hypertension    Cancer ([facility])    skin cancer on face   Colitis    DJD (degenerative joint disease)    Endometrial polyp    GERD (gastroesophageal reflux disease)    History of basal cell carcinoma excision    History of esophagitis    HOH (hard of hearing)    right ear better than left  -- wears no aides   Hyperlipidemia    Hypothyroidism    Osteopenia    Prediabetes    S/P right hip fracture 08/17/2020   Vitamin D deficiency    Wears glasses     Past Surgical History:  Procedure Laterality Date   BACK SURGERY  2020   CATARACT EXTRACTION, BILATERAL Bilateral 2020   DILATATION & CURETTAGE/HYSTEROSCOPY WITH MYOSURE N/A 10/27/2014   Procedure: DILATATION & CURETTAGE/HYSTEROSCOPY WITH MYOSURE;  Surgeon: Darlyn Chamber, MD;  Location: [facility];  Service: Gynecology;  Laterality: N/A;   DILATION AND CURETTAGE OF UTERUS     HIP SURGERY Right 04/2020   rod and screw in right hip    MOHS SURGERY  2005   LEFT NASAL BRIDGE FOR BASAL CELL   TONSILLECTOMY  age 28    Family History  Problem Relation Age of Onset   Hypertension Mother    Cancer Mother        COLON   Hyperlipidemia Mother    Cancer Father        BREAST WITH BRAIN METS   Hyperlipidemia Father    Cancer Sister        COLON (TEENS), BREAST (71)   Hyperlipidemia Sister    Diabetes Brother    Hyperlipidemia Brother    Hypertension Brother    Colon cancer Brother 93   Cancer Maternal Grandmother        RENAL    Hyperlipidemia Maternal Grandmother    Diabetes Maternal Grandfather    Stroke Maternal Grandfather    Hyperlipidemia Maternal Grandfather    Hyperlipidemia Paternal Grandmother    Hyperlipidemia Paternal Grandfather    Autism Son    Colitis Son    Autism spectrum disorder Son    Healthy Son     Social History   Tobacco Use   Smoking status: Former    Years: 15.00    Pack years: 0.00    Types: Cigarettes    Quit date: 2002    Years since quitting: 20.5   Smokeless tobacco: Never   Tobacco comments:    Former light smoker, quit 15+ years ago, can't recall  Vaping Use   Vaping Use: Never used  Substance Use Topics   Alcohol use: Yes    Comment: rarely    Drug use: No     Home Medications Prior to Admission medications   Medication Sig Start Date End Date Taking? Authorizing Provider  acetaminophen (TYLENOL) 500 MG tablet Take 500 mg by mouth at bedtime as needed for moderate pain or  headache.     [provider]  Ascorbic Acid (VITAMIN C) 1000 MG tablet Take 1,000 mg by mouth daily.     [provider]  aspirin EC 81 MG tablet Take 81 mg by mouth daily.     [provider]  Calcium Carbonate-Vitamin D 600-400 MG-UNIT tablet Take 1 tablet by mouth daily.    [provider]  Cholecalciferol 50 MCG (2000 UT) TABS Take by mouth.    [provider]  Cinnamon 500 MG TABS Take 500 mg by mouth daily.     [provider]  Coenzyme Q10 (COQ10) 200 MG CAPS Take 200 mg by mouth daily.    [provider]  colestipol (COLESTID) 1 g tablet 2 g PO every day - take prior to lunch, at least 1 hour separate from other medications 07/26/20   [provider]  dicyclomine (BENTYL) 20 MG tablet Take   1 tablet     3 x /day     before Meals 11/24/20   Unk Pinto, MD  levothyroxine (SYNTHROID) 125 MCG tablet Take  1 tablet  Daily  on an empty stomach with only water for 30 minutes & no Antacid meds, Calcium or Magnesium for 4  hours & avoid Biotin 01/17/21   Unk Pinto, MD  loratadine (CLARITIN) 10 MG tablet Take by mouth.    [provider]  losartan (COZAAR) 50 MG tablet TAKE 1/2 TABLET IN THE EVENING FOR BLOOD PRESSURE GOAL<140/80. IF GREATER THAN THIS IN TWO WEEKS, INCREASE TO 1 TABLET DAILY 05/16/21   Liane Comber, NP  Multiple Vitamin (THERA VITAMIN PO) Take 1 tablet by mouth daily.    [provider]  Omega-3 Fatty Acids (FISH OIL) 1200 MG CAPS Take 1,200 mg by mouth daily.     [provider]  pantoprazole (PROTONIX) 40 MG tablet Take 1 tablet  2 x /day  for Heartburn & Indigestion 03/08/20   Unk Pinto, MD  Polyethyl Glycol-Propyl Glycol 0.4-0.3 % SOLN Place 1 drop into both eyes at bedtime.    [provider]  Probiotic Product (PROBIOTIC PO) Take 1 capsule by mouth daily.    [provider]  PROLIA 60 MG/ML SOSY injection Inject 60 mg into the skin every 6 (six) months. 01/28/20   [provider]  simvastatin (ZOCOR) 20 MG tablet TAKE 1 TABLET BY MOUTH AT BEDTIME FOR CHOLESTEROL 04/24/21   Liane Comber, NP  Vedolizumab (ENTYVIO IV) Inject into the vein.    [provider]     Allergies    Fosamax [alendronate sodium], Singulair [montelukast sodium], Wellbutrin [bupropion], and Sulfa antibiotics   Review of Systems   Review of Systems A comprehensive review of systems was completed and negative except as noted in HPI.    Physical Exam BP (!) 160/91 (BP Location: Right Arm)   Pulse 60   Temp 99.1 F (37.3 C)   Resp 16   Ht 5' 4"  (1.626 m)   Wt 49.9 kg   SpO2 99%   BMI 18.88 kg/m   Physical Exam Vitals and nursing note reviewed.  Constitutional:      Appearance: Normal appearance.  HENT:     Head: Normocephalic and atraumatic.     Nose: Nose normal.     Mouth/Throat:     Mouth: Mucous membranes are moist.  Eyes:     Extraocular Movements: Extraocular movements intact.     Conjunctiva/sclera: Conjunctivae normal.   Cardiovascular:     Rate and Rhythm:  Normal rate.  Pulmonary:     Effort: Pulmonary effort is normal.     Breath sounds: Normal breath sounds.  Abdominal:     General: Abdomen is flat.     Palpations: Abdomen is soft.     Tenderness: There is no abdominal tenderness.  Musculoskeletal:        General: Tenderness (mild popliteal fossa) present. No swelling. Normal range of motion.     Cervical back: Neck supple.     Right lower leg: No edema.     Left lower leg: No edema.  Skin:    General: Skin is warm and dry.  Neurological:     General: No focal deficit present.     Mental Status: She is alert.  Psychiatric:        Mood and Affect: Mood normal.     ED Results / Procedures / Treatments   Labs (all labs ordered are listed, but only abnormal results are displayed) Labs Reviewed - No data to display  EKG None   Radiology US Venous Img Lower Right (DVT Study)  Result Date: 05/26/2021 CLINICAL DATA:  Right posterior knee pain and edema. EXAM: RIGHT LOWER EXTREMITY VENOUS DOPPLER ULTRASOUND TECHNIQUE: Gray-scale sonography with graded compression, as well as color Doppler and duplex ultrasound were performed to evaluate the lower extremity deep venous systems from the level of the common femoral vein and including the common femoral, femoral, profunda femoral, popliteal and calf veins including the posterior tibial, peroneal and gastrocnemius veins when visible. The superficial great saphenous vein was also interrogated. Spectral Doppler was utilized to evaluate flow at rest and with distal augmentation maneuvers in the common femoral, femoral and popliteal veins. COMPARISON:  None. FINDINGS: Contralateral Common Femoral Vein: Respiratory phasicity is normal and symmetric with the symptomatic side. No evidence of thrombus. Normal compressibility. Common Femoral Vein: No evidence of thrombus. Normal compressibility, respiratory phasicity and response to augmentation. Saphenofemoral  Junction: No evidence of thrombus. Normal compressibility and flow on color Doppler imaging. Profunda Femoral Vein: No evidence of thrombus. Normal compressibility and flow on color Doppler imaging. Femoral Vein: No evidence of thrombus. Normal compressibility, respiratory phasicity and response to augmentation. Popliteal Vein: No evidence of thrombus. Normal compressibility, respiratory phasicity and response to augmentation. Calf Veins: No evidence of thrombus. Normal compressibility and flow on color Doppler imaging. Superficial Great Saphenous Vein: No evidence of thrombus. Normal compressibility. Venous Reflux:  None. Other Findings: No evidence of superficial thrombophlebitis or abnormal fluid collection. IMPRESSION: No evidence of right lower extremity DVT or Baker's cyst. Electronically Signed   By: Aletta Edouard M.D.   On: 05/26/2021 14:18    Procedures Procedures  Medications Ordered in the ED Medications - No data to display   MDM Rules/Calculators/A&P MDM Patient with discomfort at popliteal fossa, no distal edema or tenderness over the proximal deep veins. Will check doppler.   ED Course  I have reviewed the triage vital signs and the nursing notes.  Pertinent labs & imaging results that were available during my care of the patient were reviewed by me and considered in my medical decision making (see chart for details).  Clinical Course as of 05/26/21 1448  Fri May 26, 2021  1446 Doppler is negative. Patient is ready to go home.  [CS]    Clinical Course User Index [CS] Truddie Hidden, MD    Final Clinical Impression(s) / ED Diagnoses Final diagnoses:  Acute pain of right knee    Rx / DC Orders ED  Discharge Orders     None        Truddie Hidden, MD 05/26/21 [phone]

## 2021-05-26 NOTE — ED Triage Notes (Addendum)
Pt arrives to ED with swelling to left posterior knee that started yesterday. Pt reports more pressure to area than pain. No discoloration to leg. Extremity is warm to the touch. Denies injury to leg. Had infusion of Entyvio yesterday for diarrhea.

## 2021-05-26 NOTE — ED Notes (Signed)
Pt ambulated to bathroom and tolerated well,  no complaints at this time.

## 2021-05-30 DIAGNOSIS — M25561 Pain in right knee: Secondary | ICD-10-CM | POA: Diagnosis not present

## 2021-05-31 ENCOUNTER — Other Ambulatory Visit: Payer: Self-pay | Admitting: Internal Medicine

## 2021-06-02 ENCOUNTER — Encounter: Payer: Self-pay | Admitting: Internal Medicine

## 2021-06-02 DIAGNOSIS — K52839 Microscopic colitis, unspecified: Principal | ICD-10-CM

## 2021-06-02 MED ORDER — COLESTIPOL 1 GRAM TABLET
ORAL_TABLET | 11 refills | 0 days | Status: CP
Start: 2021-06-02 — End: ?

## 2021-06-06 ENCOUNTER — Encounter: Payer: Self-pay | Admitting: Adult Health

## 2021-06-06 DIAGNOSIS — S72141K Displaced intertrochanteric fracture of right femur, subsequent encounter for closed fracture with nonunion: Secondary | ICD-10-CM | POA: Diagnosis not present

## 2021-06-07 ENCOUNTER — Encounter: Payer: Self-pay | Admitting: Internal Medicine

## 2021-06-07 ENCOUNTER — Other Ambulatory Visit: Payer: Self-pay

## 2021-06-07 ENCOUNTER — Ambulatory Visit (INDEPENDENT_AMBULATORY_CARE_PROVIDER_SITE_OTHER): Payer: Medicare Other | Admitting: Internal Medicine

## 2021-06-07 ENCOUNTER — Ambulatory Visit: Payer: Medicare Other | Admitting: Nurse Practitioner

## 2021-06-07 VITALS — BP 139/81 | HR 67 | Temp 97.7°F | Resp 116 | Ht 64.5 in | Wt 111.6 lb

## 2021-06-07 DIAGNOSIS — M26622 Arthralgia of left temporomandibular joint: Secondary | ICD-10-CM | POA: Diagnosis not present

## 2021-06-07 MED ORDER — MELOXICAM 15 MG PO TABS: ORAL_TABLET | ORAL | 3 refills | Status: DC

## 2021-06-07 MED ORDER — DEXAMETHASONE 4 MG PO TABS: ORAL_TABLET | ORAL | 0 refills | Status: DC

## 2021-06-07 NOTE — Progress Notes (Signed)
tmj   Future Appointments  Date Time Provider [place]  06/07/2021 10:30 AM Unk Pinto, MD [facility] None  08/08/2021 10:45 AM Bo Merino, MD [facility] None  08/21/2021  9:30 AM Liane Comber, NP [facility] None  01/15/2022 10:30 AM Unk Pinto, MD [facility] None  05/15/2022  3:00 PM Unk Pinto, MD [facility] None    History of Present Illness:     Patient is a very nice 80 yo MWF presenting with a several week hx/o intermittent sharp Lt ear pains associated with chewing.   Medications  Current Outpatient Medications (Endocrine & Metabolic):    levothyroxine 125 MCG tablet, TAKE 1 TABLET BY MOUTH DAILY    PROLIA 60 MG/ML  injec, Inject 60 mg into the skin every 6 months.   COLESTID 1 g tablet, 2 g every day    simvastatin 20 MG tablet, TAKE 1 TABLET  AT BEDTIME    loratadine 10 MG tablet, Take by mouth.   acetaminophen 500 MG, Take at bedtime as needed   aspirin EC 81 MG tablet, Take  daily.    VITAMIN C 1000 MG tablet, Take daily.    Calcium Carbonate-Vitamin D 600 mg-400 u daily.   Vit D 2000 u , Take daily   Cinnamon 500 MG TABS, Take  daily.    Coenzyme Q10 200 MG CAPS, Take  daily.   dicyclomine 20 MG tablet, Take   1 tablet 3 x /day     Multiple Vitamin , Take 1 tablet daily.   Omega-3 FISH OIL 1200 MG CAPS, Take  daily.    pantoprazole 40 MG , Take 1 tablet  2 x /day    Polyethyl Glycol SOLN, Place 1 drop into both eyes at bedtime.   Probiotic Take 1 capsule  daily.   VENTYVIO IV, Inject into the vein.  Problem list She has Essential hypertension; Hyperlipidemia, mixed; Hypothyroid; GERD; Asthma; DJD (degenerative joint disease); Vitamin D deficiency; Osteoporosis; Allergy; Medication management; Colitis; Spondylolisthesis at L4-L5 level; Vertigo; Bilateral temporomandibular joint pain; Senile purpura ([place]); Bilateral nephrolithiasis; Body mass index (BMI) of 19.0-19.9 in adult; and Complete right bundle branch block (RBBB) on their problem  list.   Observations/Objective:  BP 139/81   Pulse 67   Temp 97.7 F (36.5 C)   Resp (!) 116   Ht 5' 4.5" (1.638 m)   Wt 111 lb 9.6 oz (50.6 kg)   SpO2 98%   BMI 18.86 kg/m   HEENT - Rt EAC/TM Nl . Lt EAC/TM - Nl. Lt TM exquisite tender                    EOM's full/conjugate. N/O/P - clear Neck - supple. Car 2+/2+ w/o Br, N, JVD Chest - Clear equal BS. Cor - Nl HS. RRR w/o sig MGR. PP 1(+). No edema. MS- FROM w/o deformities.  Gait Nl. Neuro -  Nl w/o focal abnormalities.  Assessment and Plan:  1. Arthralgia of left temporomandibular joint  - dexamethasone 4 MG tablet; Take 1 tab 3 x day - 3 days, then 2 x day - 3 days, then 1 tab daily  Disp: 20 tabs  - meloxicam 15 MG tablet; Take   1/2 to 1 tablet  Daily  Disp 90 tabs; Rf: 3   Follow Up Instructions:        I discussed the assessment and treatment plan with the patient. The patient was provided an opportunity to ask questions and all were answered. The patient agreed with the  plan and demonstrated an understanding of the instructions.       The patient was advised to call back or seek an in-person evaluation if the symptoms worsen or if the condition fails to improve as anticipated.    Kirtland Bouchard, MD

## 2021-06-14 ENCOUNTER — Other Ambulatory Visit: Payer: Self-pay

## 2021-06-14 ENCOUNTER — Ambulatory Visit (INDEPENDENT_AMBULATORY_CARE_PROVIDER_SITE_OTHER): Payer: Medicare Other

## 2021-06-14 DIAGNOSIS — Z79899 Other long term (current) drug therapy: Secondary | ICD-10-CM | POA: Diagnosis not present

## 2021-06-14 DIAGNOSIS — I10 Essential (primary) hypertension: Secondary | ICD-10-CM | POA: Diagnosis not present

## 2021-06-14 DIAGNOSIS — E039 Hypothyroidism, unspecified: Secondary | ICD-10-CM | POA: Diagnosis not present

## 2021-06-14 LAB — TSH: TSH: 1.42 mIU/L (ref 0.40–4.50)

## 2021-06-14 LAB — BASIC METABOLIC PANEL WITH GFR
BUN: 13 mg/dL (ref 7–25)
CO2: 27 mmol/L (ref 20–32)
Calcium: 9.5 mg/dL (ref 8.6–10.4)
Chloride: 100 mmol/L (ref 98–110)
Creat: 0.8 mg/dL (ref 0.60–1.00)
Glucose, Bld: 78 mg/dL (ref 65–99)
Potassium: 4.7 mmol/L (ref 3.5–5.3)
Sodium: 135 mmol/L (ref 135–146)
eGFR: 75 mL/min/{1.73_m2} (ref >=60)

## 2021-06-14 NOTE — Progress Notes (Signed)
Patient presents for a BP check and to have BMP and THS rechecked. Patient is taking her medication 1 pill 2 days a week and then 1/2 a pill the rest. She can't remember what day she takes the whole pill but is taking medication like she was told.

## 2021-06-19 DIAGNOSIS — F4320 Adjustment disorder, unspecified: Secondary | ICD-10-CM | POA: Diagnosis not present

## 2021-06-22 DIAGNOSIS — K51919 Ulcerative colitis, unspecified with unspecified complications: Secondary | ICD-10-CM | POA: Diagnosis not present

## 2021-06-27 DIAGNOSIS — S72001K Fracture of unspecified part of neck of right femur, subsequent encounter for closed fracture with nonunion: Secondary | ICD-10-CM | POA: Diagnosis not present

## 2021-06-27 DIAGNOSIS — M810 Age-related osteoporosis without current pathological fracture: Secondary | ICD-10-CM | POA: Diagnosis not present

## 2021-06-28 DIAGNOSIS — R233 Spontaneous ecchymoses: Secondary | ICD-10-CM | POA: Diagnosis not present

## 2021-06-28 NOTE — Progress Notes (Signed)
Assessment and Plan:   Debbie Houston was seen today for acute visit, headache and dizziness.  Diagnoses and all orders for this visit:  Vasculitis of skin -     CBC with Differential/Platelet -     COMPLETE METABOLIC PANEL WITH GFR -     Sedimentation rate; Future -     ANA -     C-reactive protein -     Complement, total -     Urinalysis, Routine w reflex microscopic -     predniSONE (DELTASONE) 20 MG tablet; 1 pill 3 x a day for 3 days, 1 pill 2 x a day x 3 days, 1 pill a day x 5 days with food - Rule out giant cell arteritis, recommend eye exam - F/U in 10 -14 days for further evaluation  Fever, unspecified fever cause -     CBC with Differential/Platelet    Further disposition pending results of labs. Discussed med's effects and SE's.   Over 30 minutes of exam, counseling, chart review, and critical decision making was performed.   Future Appointments  Date Time Provider [place]  08/08/2021 10:45 AM Bo Merino, MD [facility] None  08/21/2021  9:30 AM Magda Bernheim, NP [facility] None  01/15/2022 10:30 AM Unk Pinto, MD [facility] None  05/15/2022  3:00 PM Unk Pinto, MD [facility] None    ------------------------------------------------------------------------------------------------------------------   HPI BP 120/78   Pulse 76   Temp (!) 97.5 F (36.4 C)   Wt 111 lb 9.6 oz (50.6 kg)   SpO2 97%   BMI 18.86 kg/m  80 y.o.female presents for bruising on lower leg which has been present for several week and appears to be getting worse.  Running a low grade fever of 99.1 . Saw vein doctor Dr. Aleda Grana 3 days ago and he referred to PCP.  Pt has also had associated dizziness, blurred vision, right temple pain, low grade fever and joint aches for several weeks.  Past Medical History:  Diagnosis Date   Arthritis    Benign labile hypertension    Cancer ([facility])    skin cancer on face   Colitis    DJD (degenerative joint disease)    Endometrial polyp     GERD (gastroesophageal reflux disease)    History of basal cell carcinoma excision    History of esophagitis    HOH (hard of hearing)    right ear better than left  -- wears no aides   Hyperlipidemia    Hypothyroidism    Osteopenia    Prediabetes    S/P right hip fracture 08/17/2020   Vitamin D deficiency    Wears glasses      Allergies  Allergen Reactions   Fosamax [Alendronate Sodium] Other (See Comments)    GI upset    Singulair [Montelukast Sodium] Other (See Comments)    Makes pt jittery   Wellbutrin [Bupropion] Hives   Sulfa Antibiotics Rash    Current Outpatient Medications on File Prior to Visit  Medication Sig   acetaminophen (TYLENOL) 500 MG tablet Take 500 mg by mouth at bedtime as needed for moderate pain or headache.    Ascorbic Acid (VITAMIN C) 1000 MG tablet Take 1,000 mg by mouth daily.    aspirin EC 81 MG tablet Take 81 mg by mouth daily.    Calcium Carbonate-Vitamin D 600-400 MG-UNIT tablet Take 1 tablet by mouth daily.   Cholecalciferol 50 MCG (2000 UT) TABS Take by mouth.   Cinnamon 500 MG TABS Take 500 mg  by mouth daily.    Coenzyme Q10 (COQ10) 200 MG CAPS Take 200 mg by mouth daily.   colestipol (COLESTID) 1 g tablet 2 g PO every day - take prior to lunch, at least 1 hour separate from other medications   dicyclomine (BENTYL) 20 MG tablet Take   1 tablet     3 x /day     before Meals   levothyroxine (SYNTHROID) 125 MCG tablet TAKE 1 TABLET BY MOUTH DAILY ON AN EMPTY STOMACH WITH ONLY WATER FOR 30 MINUTES AND NO ANTACID MEDS, CALCIUM, OR MAGNESIUM FOR 4 HOURS   loratadine (CLARITIN) 10 MG tablet Take by mouth.   losartan (COZAAR) 50 MG tablet TAKE 1/2 TABLET IN THE EVENING FOR BLOOD PRESSURE GOAL<140/80. IF GREATER THAN THIS IN TWO WEEKS, INCREASE TO 1 TABLET DAILY   Multiple Vitamin (THERA VITAMIN PO) Take 1 tablet by mouth daily.   Omega-3 Fatty Acids (FISH OIL) 1200 MG CAPS Take 1,200 mg by mouth daily.    pantoprazole (PROTONIX) 40 MG tablet Take 1  tablet  2 x /day  for Heartburn & Indigestion   Polyethyl Glycol-Propyl Glycol 0.4-0.3 % SOLN Place 1 drop into both eyes at bedtime.   Probiotic Product (PROBIOTIC PO) Take 1 capsule by mouth daily.   PROLIA 60 MG/ML SOSY injection Inject 60 mg into the skin every 6 (six) months.   simvastatin (ZOCOR) 20 MG tablet TAKE 1 TABLET BY MOUTH AT BEDTIME FOR CHOLESTEROL   Vedolizumab (ENTYVIO IV) Inject into the vein.   No current facility-administered medications on file prior to visit.    Review of Systems  Constitutional:  Positive for fever and malaise/fatigue. Negative for chills.  HENT:  Positive for hearing loss. Negative for congestion, nosebleeds, sinus pain and sore throat.   Eyes:  Positive for blurred vision. Negative for double vision.  Respiratory:  Negative for cough, shortness of breath and wheezing.   Cardiovascular:  Negative for chest pain, palpitations and leg swelling.  Gastrointestinal:  Positive for diarrhea. Negative for abdominal pain, heartburn, nausea and vomiting.  Genitourinary:  Positive for dysuria.  Musculoskeletal:  Positive for joint pain.  Skin:  Positive for rash (bruising along a vein on right lower leg).  Neurological:  Positive for dizziness, sensory change (right leg since hip fracture) and headaches.  Endo/Heme/Allergies:  Bruises/bleeds easily.  Psychiatric/Behavioral:  Negative for depression.      Physical Exam:  BP 120/78   Pulse 76   Temp (!) 97.5 F (36.4 C)   Wt 111 lb 9.6 oz (50.6 kg)   SpO2 97%   BMI 18.86 kg/m   General Appearance: Well nourished, in no apparent distress. Eyes: PERRLA, EOMs, conjunctiva no swelling or erythema Sinuses: No Frontal/maxillary tenderness ENT/Mouth: Ext aud canals clear, TMs without erythema, bulging. No erythema, swelling, or exudate on post pharynx.  Tonsils not swollen or erythematous. Hearing normal. Tenderness at right temple Neck: Supple, thyroid normal.  Respiratory: Respiratory effort normal,  BS equal bilaterally without rales, rhonchi, wheezing or stridor.  Cardio: RRR with no MRGs. Brisk peripheral pulses without edema.  Abdomen: Soft, + BS.  Non tender, no guarding, rebound, hernias, masses. Lymphatics: Non tender without lymphadenopathy.  Musculoskeletal: Full ROM, 5/5 strength, normal gait.  Skin: petecchial bruising following vein on right lower leg, nontender Neuro: Cranial nerves intact. Normal muscle tone, no cerebellar symptoms. Sensation intact.  Psych: Awake and oriented X 3, normal affect, Insight and Judgment appropriate.     Magda Bernheim, NP 12:21 PM [facility]  Adult [facility] Adolescent [facility]

## 2021-06-30 ENCOUNTER — Ambulatory Visit (INDEPENDENT_AMBULATORY_CARE_PROVIDER_SITE_OTHER): Payer: Medicare Other | Admitting: Nurse Practitioner

## 2021-06-30 ENCOUNTER — Other Ambulatory Visit: Payer: Self-pay

## 2021-06-30 ENCOUNTER — Encounter: Payer: Self-pay | Admitting: Nurse Practitioner

## 2021-06-30 VITALS — BP 120/78 | HR 76 | Temp 97.5°F | Wt 111.6 lb

## 2021-06-30 DIAGNOSIS — R509 Fever, unspecified: Secondary | ICD-10-CM

## 2021-06-30 DIAGNOSIS — L959 Vasculitis limited to the skin, unspecified: Secondary | ICD-10-CM

## 2021-06-30 MED ORDER — PREDNISONE 20 MG PO TABS: ORAL_TABLET | ORAL | 0 refills | Status: DC

## 2021-06-30 NOTE — Patient Instructions (Signed)
Vasculitis  Vasculitis is inflammation of the blood vessels. With vasculitis, the blood vessels can become thick, narrow, scarred, or weak. Enough blood may not be able to flow through them. This can cause damage to the muscles, kidneys,lungs, brain, and other parts of the body. There are many types of vasculitis. The different types may affect different kinds of blood vessels or different areas of the body. Some types last only ashort time, while others last a long time. What are the causes? The exact cause of this condition is not known. However, vasculitis can develop when the body's defense system (immune system) attacks its own blood vessels. This attack can be caused by: An infection. An immune system disease, such as lupus, rheumatoid arthritis, or scleroderma. An allergic reaction to a medicine. A cancer that affects blood cells, such as leukemia or lymphoma. What increases the risk? The following factors may make you more likely to develop this condition: Being a smoker. Being under stress. Having a physical injury. What are the signs or symptoms? Symptoms of this condition depend on the type of vasculitis that you have. Symptoms that are common to all types of vasculitis include: Fever. Poor appetite. Weight loss. Feeling very tired (fatigue). Having aches and pains. Weakness. Numbness in an area of your body. Symptoms for specific types of vasculitis include: Skin problems, such as sores, spots, or rashes. Trouble seeing. Trouble breathing. Coughing up blood. Blood in your urine. Headaches. Stomach pain. Stuffy or bloody nose. How is this diagnosed? This condition may be diagnosed based on: Your symptoms. A physical exam. You may also have tests, including: Blood tests. A urine test. A biopsy of a blood vessel. A test to measure the electrical signals moving through nerves (nerve conduction study). Imaging tests, such as: X-rays. CT  scan. Ultrasound. MRI. Angiogram. How is this treated? Treatment for this condition will depend on the type of vasculitis that you have and how severe the symptoms are. Sometimes treatment is not needed. Treatment often includes: Medicines. Physical therapy or occupational therapy. This helps strengthen muscles that were weakened by the disease. You will need to see your health care provider while you are being treated. During follow-up visits, your health care provider may: Perform blood tests and bone density tests. Check your blood pressure and blood sugar. Check for side effects of any medicines you are taking. Vasculitis cannot always be cured. Sometimes symptoms go away but the disease does not (the disease goes into remission). If symptoms return, increased treatment may be needed. Follow these instructions at home: Take over-the-counter and prescription medicines only as told by your health care provider. Exercise as directed. Talk with your health care provider about what exercises are okay for you to do. Exercises that increase your heart rate (aerobic exercise), such as walking, are usually recommended. Aerobic exercise helps control your blood pressure and prevent bone loss. Follow a healthy diet. Make sure your diet includes fruits, vegetables, whole grains, and healthy sources of protein. Learn as much as you can about vasculitis, and consider joining a support group. Talk to other people who have your condition. This may help you cope with the illness. Talk with your health care provider if you feel stressed, anxious, or depressed. Keep all follow-up visits as told by your health care provider. This is important. Contact a health care provider if: Your symptoms return or you have new symptoms. Your fever, fatigue, headache, or weight loss gets worse. You have signs of infection, such as redness, swelling,  tenderness, warmth, or a new fever. Your pain does not go away, even  after you take pain medicine. Your nose bleeds. Get help right away if: Your vision gets worse. You have chest pain or stomach pain. You have trouble breathing. One side of your face or body suddenly becomes weak or numb. There is blood in your urine. Summary Vasculitis is inflammation of the blood vessels that may cause them to become thick, narrow, scarred, or weak. Enough blood may not be able to flow through them. This can cause damage throughout your body. The exact cause of this condition is not known. However, vasculitis can develop when the body's immune system attacks its own blood vessels. This attack may be caused by an infection, an immune system disease, an allergic reaction to a medicine, or a cancer that affects blood cells, such as leukemia or lymphoma. Vasculitis cannot always be cured. Sometimes symptoms go away but the disease does not (the disease goes into remission). If symptoms return, increased treatment may be needed. This information is not intended to replace advice given to you by your health care provider. Make sure you discuss any questions you have with your healthcare provider. Document Revised: 12/06/2017 Document Reviewed: 11/26/2017 Elsevier Patient Education  2022 [employer].

## 2021-07-03 LAB — CBC WITH DIFFERENTIAL/PLATELET
Absolute Monocytes: 853 cells/uL (ref 200–950)
Basophils Absolute: 49 cells/uL (ref 0–200)
Basophils Relative: 0.6 %
Eosinophils Absolute: 254 cells/uL (ref 15–500)
Eosinophils Relative: 3.1 %
HCT: 39.1 % (ref 35.0–45.0)
Hemoglobin: 12.7 g/dL (ref 11.7–15.5)
Lymphs Abs: 910 cells/uL (ref 850–3900)
MCH: 31.7 pg (ref 27.0–33.0)
MCHC: 32.5 g/dL (ref 32.0–36.0)
MCV: 97.5 fL (ref 80.0–100.0)
MPV: 10.7 fL (ref 7.5–12.5)
Monocytes Relative: 10.4 %
Neutro Abs: 6134 cells/uL (ref 1500–7800)
Neutrophils Relative %: 74.8 %
Platelets: 226 10*3/uL (ref 140–400)
RBC: 4.01 10*6/uL (ref 3.80–5.10)
RDW: 12.9 % (ref 11.0–15.0)
Total Lymphocyte: 11.1 %
WBC: 8.2 10*3/uL (ref 3.8–10.8)

## 2021-07-03 LAB — ANA: Anti Nuclear Antibody (ANA): NEGATIVE

## 2021-07-03 LAB — COMPLETE METABOLIC PANEL WITH GFR
AG Ratio: 2.1 (calc) (ref 1.0–2.5)
ALT: 17 U/L (ref 6–29)
AST: 19 U/L (ref 10–35)
Albumin: 4.1 g/dL (ref 3.6–5.1)
Alkaline phosphatase (APISO): 54 U/L (ref 37–153)
BUN: 9 mg/dL (ref 7–25)
CO2: 26 mmol/L (ref 20–32)
Calcium: 8.7 mg/dL (ref 8.6–10.4)
Chloride: 101 mmol/L (ref 98–110)
Creat: 0.67 mg/dL (ref 0.60–1.00)
Globulin: 2 g/dL (calc) (ref 1.9–3.7)
Glucose, Bld: 90 mg/dL (ref 65–99)
Potassium: 4.6 mmol/L (ref 3.5–5.3)
Sodium: 134 mmol/L — ABNORMAL LOW (ref 135–146)
Total Bilirubin: 0.7 mg/dL (ref 0.2–1.2)
Total Protein: 6.1 g/dL (ref 6.1–8.1)
eGFR: 89 mL/min/{1.73_m2} (ref >=60)

## 2021-07-03 LAB — URINALYSIS, ROUTINE W REFLEX MICROSCOPIC
Bacteria, UA: NONE SEEN /HPF
Bilirubin Urine: NEGATIVE
Glucose, UA: NEGATIVE
Hgb urine dipstick: NEGATIVE
Hyaline Cast: NONE SEEN /LPF
Ketones, ur: NEGATIVE
Nitrite: NEGATIVE
Protein, ur: NEGATIVE
RBC / HPF: NONE SEEN /HPF (ref 0–2)
Specific Gravity, Urine: 1.008 (ref 1.001–1.035)
Squamous Epithelial / HPF: NONE SEEN /HPF (ref <=5)
pH: 7.5 (ref 5.0–8.0)

## 2021-07-03 LAB — MICROSCOPIC MESSAGE

## 2021-07-03 LAB — C-REACTIVE PROTEIN: CRP: 61.9 mg/L — ABNORMAL HIGH (ref <8.0)

## 2021-07-03 LAB — COMPLEMENT, TOTAL: Compl, Total (CH50): >60 U/mL — ABNORMAL HIGH (ref 31–60)

## 2021-07-04 ENCOUNTER — Other Ambulatory Visit: Payer: Self-pay | Admitting: Nurse Practitioner

## 2021-07-04 DIAGNOSIS — M316 Other giant cell arteritis: Secondary | ICD-10-CM

## 2021-07-04 DIAGNOSIS — I776 Arteritis, unspecified: Secondary | ICD-10-CM

## 2021-07-04 DIAGNOSIS — S72141K Displaced intertrochanteric fracture of right femur, subsequent encounter for closed fracture with nonunion: Secondary | ICD-10-CM | POA: Diagnosis not present

## 2021-07-04 MED ORDER — PREDNISONE 20 MG PO TABS: ORAL_TABLET | ORAL | 0 refills | Status: AC

## 2021-07-05 NOTE — Progress Notes (Signed)
Assessment and Plan: Debbie Houston was seen today for follow-up.  Diagnoses and all orders for this visit:  Giant cell arteritis ([place])       - Continue with Prednisone 84m 2 tabs daily       - Pt has seen Dr. DEstanislado Pandyin the past and is to follow up, they have attempted to call pt  Pt given number and will call for appointment  Vasculitis ([place]       -  Continue with Prednisone 215m2 tabs daily       - Pt has seen Dr. DeEstanislado Pandyn the past and is to follow up, they have attempted to call pt  Pt given number and will call for appointment  Blurred Vision bilaterally       - Keep appointment with neurology and schedule eye exam with her eye doctor  Further disposition pending results of labs. Discussed med's effects and SE's.   Over 20 minutes of exam, counseling, chart review, and critical decision making was performed.   Future Appointments  Date Time Provider [place]9/20/2022 10:45 AM DeBo MerinoMD [facility] None  08/21/2021  9:30 AM MuMagda BernheimNP [facility] None  01/15/2022 10:30 AM McUnk PintoMD [facility] None  05/15/2022  3:00 PM McUnk PintoMD [facility] None    ------------------------------------------------------------------------------------------------------------------   HPI BP 126/82   Pulse 84   Temp 97.9 F (36.6 C)   Resp 16   Ht 5' 4.5" (1.638 m)   Wt 112 lb (50.8 kg)   SpO2 99%   BMI 18.93 kg/m  79 y.o.female presents for reevaluation of vasculitis. She has been on Prednisone 60 mg daily until yesterday when she started on 4064maily.  Feels increased anxiety with steroid use.  Has noticed non difference between the 74m84md 40mg25me as related to symptom control.  Headache has improved but continues to have pain in left leg and some blurred vision.  Past Medical History:  Diagnosis Date   Arthritis    Benign labile hypertension    Cancer (HCC)    skin cancer on face   Colitis    DJD (degenerative joint disease)     Endometrial polyp    GERD (gastroesophageal reflux disease)    History of basal cell carcinoma excision    History of esophagitis    HOH (hard of hearing)    right ear better than left  -- wears no aides   Hyperlipidemia    Hypothyroidism    Osteopenia    Prediabetes    S/P right hip fracture 08/17/2020   Vitamin D deficiency    Wears glasses      Allergies  Allergen Reactions   Fosamax [Alendronate Sodium] Other (See Comments)    GI upset    Singulair [Montelukast Sodium] Other (See Comments)    Makes pt jittery   Wellbutrin [Bupropion] Hives   Sulfa Antibiotics Rash    Current Outpatient Medications on File Prior to Visit  Medication Sig   acetaminophen (TYLENOL) 500 MG tablet Take 500 mg by mouth at bedtime as needed for moderate pain or headache.    Ascorbic Acid (VITAMIN C) 1000 MG tablet Take 1,000 mg by mouth daily.    aspirin EC 81 MG tablet Take 81 mg by mouth daily.    Calcium Carbonate-Vitamin D 600-400 MG-UNIT tablet Take 1 tablet by mouth daily.   Cholecalciferol 50 MCG (2000 UT) TABS Take by mouth.   Cinnamon 500 MG TABS Take 500 mg  by mouth daily.    Coenzyme Q10 (COQ10) 200 MG CAPS Take 200 mg by mouth daily.   colestipol (COLESTID) 1 g tablet 2 g PO every day - take prior to lunch, at least 1 hour separate from other medications   dicyclomine (BENTYL) 20 MG tablet Take   1 tablet     3 x /day     before Meals   levothyroxine (SYNTHROID) 125 MCG tablet TAKE 1 TABLET BY MOUTH DAILY ON AN EMPTY STOMACH WITH ONLY WATER FOR 30 MINUTES AND NO ANTACID MEDS, CALCIUM, OR MAGNESIUM FOR 4 HOURS   loratadine (CLARITIN) 10 MG tablet Take by mouth.   losartan (COZAAR) 50 MG tablet TAKE 1/2 TABLET IN THE EVENING FOR BLOOD PRESSURE GOAL<140/80. IF GREATER THAN THIS IN TWO WEEKS, INCREASE TO 1 TABLET DAILY   Multiple Vitamin (THERA VITAMIN PO) Take 1 tablet by mouth daily.   Omega-3 Fatty Acids (FISH OIL) 1200 MG CAPS Take 1,200 mg by mouth daily.    pantoprazole (PROTONIX) 40  MG tablet Take 1 tablet  2 x /day  for Heartburn & Indigestion   Polyethyl Glycol-Propyl Glycol 0.4-0.3 % SOLN Place 1 drop into both eyes at bedtime.   predniSONE (DELTASONE) 20 MG tablet 2 tabs daily for 30 days   Probiotic Product (PROBIOTIC PO) Take 1 capsule by mouth daily.   PROLIA 60 MG/ML SOSY injection Inject 60 mg into the skin every 6 (six) months.   simvastatin (ZOCOR) 20 MG tablet TAKE 1 TABLET BY MOUTH AT BEDTIME FOR CHOLESTEROL   Vedolizumab (ENTYVIO IV) Inject into the vein.   No current facility-administered medications on file prior to visit.    ROS: all negative except above.   Physical Exam:  BP 126/82   Pulse 84   Temp 97.9 F (36.6 C)   Resp 16   Ht 5' 4.5" (1.638 m)   Wt 112 lb (50.8 kg)   SpO2 99%   BMI 18.93 kg/m   General Appearance: Well nourished, in no apparent distress. Eyes: PERRLA, EOMs, conjunctiva no swelling or erythema Sinuses: No Frontal/maxillary tenderness ENT/Mouth: Ext aud canals clear, TMs without erythema, bulging. No erythema, swelling, or exudate on post pharynx.  Tonsils not swollen or erythematous. Hearing normal.  Neck: Supple, thyroid normal.  Respiratory: Respiratory effort normal, BS equal bilaterally without rales, rhonchi, wheezing or stridor.  Cardio: RRR with no MRGs. Brisk peripheral pulses without edema.  Abdomen: Soft, + BS.  Non tender, no guarding, rebound, hernias, masses. Lymphatics: Non tender without lymphadenopathy.  Musculoskeletal: Full ROM, 5/5 strength, normal gait.  Skin: Warm, dry without rashes, lesions, ecchymosis.  Neuro: Cranial nerves intact. Normal muscle tone, no cerebellar symptoms. Sensation intact.  Psych: Awake and oriented X 3, normal affect, Insight and Judgment appropriate.     Magda Bernheim, NP 11:51 AM [facility]

## 2021-07-06 ENCOUNTER — Other Ambulatory Visit: Payer: Self-pay

## 2021-07-06 ENCOUNTER — Encounter: Payer: Self-pay | Admitting: Nurse Practitioner

## 2021-07-06 ENCOUNTER — Ambulatory Visit (INDEPENDENT_AMBULATORY_CARE_PROVIDER_SITE_OTHER): Payer: Medicare Other | Admitting: Nurse Practitioner

## 2021-07-06 VITALS — BP 126/82 | HR 84 | Temp 97.9°F | Resp 16 | Ht 64.5 in | Wt 112.0 lb

## 2021-07-06 DIAGNOSIS — M316 Other giant cell arteritis: Secondary | ICD-10-CM | POA: Diagnosis not present

## 2021-07-06 DIAGNOSIS — H538 Other visual disturbances: Secondary | ICD-10-CM

## 2021-07-06 DIAGNOSIS — I776 Arteritis, unspecified: Secondary | ICD-10-CM

## 2021-07-06 NOTE — Patient Instructions (Signed)
Dr. Estanislado Pandy [phone] Call for appointment, I have already given the referral to their office

## 2021-07-07 NOTE — Progress Notes (Signed)
Office Visit Note  Patient: Debbie Houston             Date of Birth: Nov 01, 1941           MRN: [id number]             PCP: Unk Pinto, MD Referring: Unk Pinto, MD Visit Date: 07/10/2021 Occupation: @GUAROCC @  Subjective:  Rash   History of Present Illness: Debbie Houston is a 80 y.o. female with a history of osteoarthritis and degenerative disc disease.  She states about 2 months ago she started having rash on her arms and legs.  She states the rash has been gradually getting worse.  She was seen by her PCP who did some lab work and her C-reactive protein was elevated.  She was placed on prednisone 20 mg 3 times daily and now she is on 20 mg daily.  She has not noticed an improvement in the rash..  She has not noticed any change in her rash.  She gives history of fatigue and dizziness since she has been on prednisone.  Activities of Daily Living:  Patient reports morning stiffness for 5 minutes.   Patient Denies nocturnal pain.  Difficulty dressing/grooming: Denies Difficulty climbing stairs: Denies Difficulty getting out of chair: Denies Difficulty using hands for taps, buttons, cutlery, and/or writing: Reports  Review of Systems  Constitutional:  Positive for fatigue.  HENT:  Positive for mouth dryness.   Eyes:  Positive for dryness.  Respiratory:  Positive for shortness of breath.   Cardiovascular:  Negative for swelling in legs/feet.  Gastrointestinal:  Positive for constipation and diarrhea.  Endocrine: Positive for increased urination.  Genitourinary:  Positive for involuntary urination.  Musculoskeletal:  Positive for joint pain, gait problem, joint pain, muscle weakness and morning stiffness.  Skin:  Positive for redness.  Allergic/Immunologic: Negative for susceptible to infections.  Neurological:  Positive for dizziness, numbness and weakness.  Hematological:  Negative for bruising/bleeding tendency.  Psychiatric/Behavioral:  Positive for sleep  disturbance.    PMFS History:  Patient Active Problem List   Diagnosis Date Noted   History of ulcerative colitis 07/10/2021   Complete right bundle branch block (RBBB) 05/15/2021   Body mass index (BMI) of 19.0-19.9 in adult 05/12/2021   Bilateral nephrolithiasis 09/21/2020   Senile purpura ([place]) 08/17/2020   Bilateral temporomandibular joint pain 06/15/2020   Vertigo 03/25/2019   Spondylolisthesis at L4-L5 level 11/25/2018   Colitis 12/21/2014   Medication management 04/20/2014   Essential hypertension    Hyperlipidemia, mixed    Hypothyroid    GERD    Asthma    DJD (degenerative joint disease)    Vitamin D deficiency    Osteoporosis    Allergy     Past Medical History:  Diagnosis Date   Arthritis    Benign labile hypertension    Cancer (HCC)    skin cancer on face   Colitis    DJD (degenerative joint disease)    Endometrial polyp    GERD (gastroesophageal reflux disease)    History of basal cell carcinoma excision    History of esophagitis    HOH (hard of hearing)    right ear better than left  -- wears no aides   Hyperlipidemia    Hypothyroidism    Osteopenia    Prediabetes    S/P right hip fracture 08/17/2020   Vitamin D deficiency    Wears glasses     Family History  Problem Relation Age of Onset  Hypertension Mother    Cancer Mother        COLON   Hyperlipidemia Mother    Cancer Father        BREAST WITH BRAIN METS   Hyperlipidemia Father    Cancer Sister        COLON (TEENS), BREAST (23)   Hyperlipidemia Sister    Diabetes Brother    Hyperlipidemia Brother    Hypertension Brother    Colon cancer Brother 71   Cancer Maternal Grandmother        RENAL   Hyperlipidemia Maternal Grandmother    Diabetes Maternal Grandfather    Stroke Maternal Grandfather    Hyperlipidemia Maternal Grandfather    Hyperlipidemia Paternal Grandmother    Hyperlipidemia Paternal Grandfather    Autism Son    Colitis Son    Autism spectrum disorder Son    Healthy  Son    Past Surgical History:  Procedure Laterality Date   BACK SURGERY  2020   CATARACT EXTRACTION, BILATERAL Bilateral 2020   DILATATION & CURETTAGE/HYSTEROSCOPY WITH MYOSURE N/A 10/27/2014   Procedure: DILATATION & CURETTAGE/HYSTEROSCOPY WITH MYOSURE;  Surgeon: Darlyn Chamber, MD;  Location: [place];  Service: Gynecology;  Laterality: N/A;   DILATION AND CURETTAGE OF UTERUS     HIP SURGERY Right 04/2020   rod and screw in right hip    MOHS SURGERY  2005   LEFT NASAL BRIDGE FOR BASAL CELL   TONSILLECTOMY  age 38   Social History   Social History Narrative   Not on file   Immunization History  Administered Date(s) Administered   Influenza, High Dose Seasonal PF 07/23/2014, 08/23/2015, 07/10/2016, 08/27/2017, 08/22/2018, 08/25/2019, 08/30/2020   PFIZER(Purple Top)SARS-COV-2 Vaccination 12/10/2019, 12/31/2019, 08/16/2020, 04/14/2021   Pneumococcal Conjugate-13 05/05/2015   Pneumococcal-Unspecified 08/22/2011   Tdap 08/22/2011   Zoster Recombinat (Shingrix) 09/13/2017, 01/01/2018   Zoster, Live 06/20/2006     Objective: Vital Signs: BP 131/66 (BP Location: Left Arm, Patient Position: Sitting, Cuff Size: Normal)   Pulse 72   Resp 16   Ht 5' 4"  (1.626 m)   Wt 112 lb 12.8 oz (51.2 kg)   BMI 19.36 kg/m    Physical Exam Vitals and nursing note reviewed.  Constitutional:      Appearance: She is well-developed.  HENT:     Head: Normocephalic and atraumatic.  Eyes:     Conjunctiva/sclera: Conjunctivae normal.  Cardiovascular:     Rate and Rhythm: Normal rate and regular rhythm.     Heart sounds: Normal heart sounds.  Pulmonary:     Effort: Pulmonary effort is normal.     Breath sounds: Normal breath sounds.  Abdominal:     General: Bowel sounds are normal.     Palpations: Abdomen is soft.  Musculoskeletal:     Cervical back: Normal range of motion.  Lymphadenopathy:     Cervical: No cervical adenopathy.  Skin:    General: Skin is warm and dry.      Capillary Refill: Capillary refill takes less than 2 seconds.  Neurological:     Mental Status: She is alert and oriented to person, place, and time.  Psychiatric:        Behavior: Behavior normal.     Musculoskeletal Exam: C-spine was in good range of motion.  She some thoracic kyphosis.  Shoulder joints, elbow joints, wrist joints, MCPs PIPs and DIPs with good range of motion with no synovitis.  She has DIP and PIP thickening in her hands.  Hip  joints, knee joints, ankles, MTPs and PIPs with good range of motion.  CDAI Exam: CDAI Score: -- Patient Global: --; Provider Global: -- Swollen: --; Tender: -- Joint Exam 07/10/2021   No joint exam has been documented for this visit   There is currently no information documented on the homunculus. Go to the Rheumatology activity and complete the homunculus joint exam.  Investigation: No additional findings.  Imaging: No results found.  Recent Labs: Lab Results  Component Value Date   WBC 8.2 06/30/2021   HGB 12.7 06/30/2021   PLT 226 06/30/2021   NA 134 (L) 06/30/2021   K 4.6 06/30/2021   CL 101 06/30/2021   CO2 26 06/30/2021   GLUCOSE 90 06/30/2021   BUN 9 06/30/2021   CREATININE 0.67 06/30/2021   BILITOT 0.7 06/30/2021   ALKPHOS 51 06/13/2017   AST 19 06/30/2021   ALT 17 06/30/2021   PROT 6.1 06/30/2021   ALBUMIN 4.1 06/13/2017   CALCIUM 8.7 06/30/2021   GFRAA 86 05/15/2021   June 30, 2021 ANA negative, C-reac C-reactive protein, CK H 50 greater than 60 Speciality Comments: No specialty comments available.  Procedures:  No procedures performed Allergies: Fosamax [alendronate sodium], Singulair [montelukast sodium], Wellbutrin [bupropion], and Sulfa antibiotics   Assessment / Plan:     Visit Diagnoses: Rash and other nonspecific skin eruption -patient has known history of senile purpura.  She states that rash has been worse in the last couple of months.  She was evaluated by her PCP who did CRP and as the CRP value  was elevated she was placed on prednisone 60 mg p.o. daily for 1 week and now she is on 40 mg p.o. daily.  She has not noticed any improvement in the rash.  The rash looks more likely due to senile purpura than vasculitis.  I will refer her to Dr. Wilhemina Bonito whom she has seen in the past.  I advised patient to taper prednisone over the next week. I will also obtain following labs.  Plan: Sedimentation rate, Serum protein electrophoresis with reflex, Hepatitis B core antibody, IgM, Hepatitis B surface antigen, Hepatitis C antibody, Cryoglobulin, Pan-ANCA  Positive ANA (antinuclear antibody) - 10/22/19: ANA 1:80 NS, 01/06/20: ENA negative.  She has no clinical features of autoimmune disease.  Primary osteoarthritis of both hands - severe.  Joint protection was discussed.  Primary osteoarthritis of both feet-she has some ongoing discomfort.  DDD (degenerative disc disease), cervical-she continues to have stiffness.  Spondylolisthesis at L4-L5 level - gabapentin per Dr. Duanne Moron.  Osteopenia of multiple sites - She had DEXA scan in 2020 which showed osteopenia.  She has been on Prolia injections by Dr. Layne Benton.  History of ulcerative colitis - She is currently on Entyvio by gastroenterologist at [facility].  Essential hypertension-her blood pressure is normal.  History of hyperlipidemia  History of gastroesophageal reflux (GERD)  Vitamin D deficiency  History of asthma  History of hypothyroidism  Orders: Orders Placed This Encounter  Procedures   Sedimentation rate   Serum protein electrophoresis with reflex   Hepatitis B core antibody, IgM   Hepatitis B surface antigen   Hepatitis C antibody   Cryoglobulin   Pan-ANCA    No orders of the defined types were placed in this encounter.   Follow-Up Instructions: Return in about 4 weeks (around 08/07/2021) for rash.   Bo Merino, MD  Note - This record has been created using Editor, commissioning.  Chart creation errors have been sought,  but may not  always  have been located. Such creation errors do not reflect on  the standard of medical care.

## 2021-07-10 ENCOUNTER — Encounter: Payer: Self-pay | Admitting: Internal Medicine

## 2021-07-10 ENCOUNTER — Ambulatory Visit (INDEPENDENT_AMBULATORY_CARE_PROVIDER_SITE_OTHER): Payer: Medicare Other | Admitting: Rheumatology

## 2021-07-10 ENCOUNTER — Telehealth: Payer: Self-pay | Admitting: *Deleted

## 2021-07-10 ENCOUNTER — Encounter: Payer: Self-pay | Admitting: Rheumatology

## 2021-07-10 ENCOUNTER — Other Ambulatory Visit: Payer: Self-pay

## 2021-07-10 VITALS — BP 131/66 | HR 72 | Resp 16 | Ht 64.0 in | Wt 112.8 lb

## 2021-07-10 DIAGNOSIS — M5030 Other cervical disc degeneration, unspecified cervical region: Secondary | ICD-10-CM

## 2021-07-10 DIAGNOSIS — M8589 Other specified disorders of bone density and structure, multiple sites: Secondary | ICD-10-CM

## 2021-07-10 DIAGNOSIS — M19071 Primary osteoarthritis, right ankle and foot: Secondary | ICD-10-CM | POA: Diagnosis not present

## 2021-07-10 DIAGNOSIS — Z8709 Personal history of other diseases of the respiratory system: Secondary | ICD-10-CM

## 2021-07-10 DIAGNOSIS — M19041 Primary osteoarthritis, right hand: Secondary | ICD-10-CM | POA: Diagnosis not present

## 2021-07-10 DIAGNOSIS — Z8719 Personal history of other diseases of the digestive system: Secondary | ICD-10-CM

## 2021-07-10 DIAGNOSIS — I10 Essential (primary) hypertension: Secondary | ICD-10-CM

## 2021-07-10 DIAGNOSIS — R768 Other specified abnormal immunological findings in serum: Secondary | ICD-10-CM | POA: Diagnosis not present

## 2021-07-10 DIAGNOSIS — Z8639 Personal history of other endocrine, nutritional and metabolic disease: Secondary | ICD-10-CM

## 2021-07-10 DIAGNOSIS — R21 Rash and other nonspecific skin eruption: Secondary | ICD-10-CM

## 2021-07-10 DIAGNOSIS — E559 Vitamin D deficiency, unspecified: Secondary | ICD-10-CM | POA: Diagnosis not present

## 2021-07-10 DIAGNOSIS — M4316 Spondylolisthesis, lumbar region: Secondary | ICD-10-CM

## 2021-07-10 DIAGNOSIS — M19072 Primary osteoarthritis, left ankle and foot: Secondary | ICD-10-CM

## 2021-07-10 DIAGNOSIS — M19042 Primary osteoarthritis, left hand: Secondary | ICD-10-CM

## 2021-07-10 NOTE — Telephone Encounter (Signed)
Received call from [facility] at [facility] with Dr. Danella Sensing  They have spoken with the patient and have her scheduled for 07/12/2021 at 10:30 am for a biopsy of her rash. They will send office notes and biopsy results once available.

## 2021-07-11 ENCOUNTER — Ambulatory Visit: Admit: 2021-07-11 | Discharge: 2021-07-12 | Payer: MEDICARE | Attending: Internal Medicine | Primary: Internal Medicine

## 2021-07-11 DIAGNOSIS — K529 Noninfective gastroenteritis and colitis, unspecified: Principal | ICD-10-CM

## 2021-07-11 DIAGNOSIS — K52839 Microscopic colitis, unspecified: Principal | ICD-10-CM

## 2021-07-12 DIAGNOSIS — Z85828 Personal history of other malignant neoplasm of skin: Secondary | ICD-10-CM | POA: Diagnosis not present

## 2021-07-12 DIAGNOSIS — L565 Disseminated superficial actinic porokeratosis (DSAP): Secondary | ICD-10-CM | POA: Diagnosis not present

## 2021-07-12 DIAGNOSIS — D692 Other nonthrombocytopenic purpura: Secondary | ICD-10-CM | POA: Diagnosis not present

## 2021-07-15 DIAGNOSIS — M25552 Pain in left hip: Secondary | ICD-10-CM | POA: Diagnosis not present

## 2021-07-17 ENCOUNTER — Other Ambulatory Visit: Payer: Self-pay | Admitting: *Deleted

## 2021-07-17 DIAGNOSIS — R21 Rash and other nonspecific skin eruption: Secondary | ICD-10-CM

## 2021-07-17 DIAGNOSIS — R768 Other specified abnormal immunological findings in serum: Secondary | ICD-10-CM

## 2021-07-17 DIAGNOSIS — R899 Unspecified abnormal finding in specimens from other organs, systems and tissues: Secondary | ICD-10-CM

## 2021-07-17 LAB — IFE INTERPRETATION

## 2021-07-17 LAB — PAN-ANCA
ANCA Screen: NEGATIVE
Myeloperoxidase Abs: <1 AI
Serine Protease 3: <1 AI

## 2021-07-17 LAB — PROTEIN ELECTROPHORESIS, SERUM, WITH REFLEX
Albumin ELP: 3.7 g/dL — ABNORMAL LOW (ref 3.8–4.8)
Alpha 1: 0.4 g/dL — ABNORMAL HIGH (ref 0.2–0.3)
Alpha 2: 0.9 g/dL (ref 0.5–0.9)
Beta 2: 0.2 g/dL (ref 0.2–0.5)
Beta Globulin: 0.4 g/dL (ref 0.4–0.6)
Gamma Globulin: 0.4 g/dL — ABNORMAL LOW (ref 0.8–1.7)
Total Protein: 5.9 g/dL — ABNORMAL LOW (ref 6.1–8.1)

## 2021-07-17 LAB — CRYOGLOBULIN

## 2021-07-17 LAB — HEPATITIS B CORE ANTIBODY, IGM: Hep B C IgM: NONREACTIVE

## 2021-07-17 LAB — SEDIMENTATION RATE: Sed Rate: 11 mm/h (ref 0–30)

## 2021-07-17 LAB — HEPATITIS C ANTIBODY
Hepatitis C Ab: NONREACTIVE
SIGNAL TO CUT-OFF: 0 (ref <1.00)

## 2021-07-17 LAB — HEPATITIS B SURFACE ANTIGEN: Hepatitis B Surface Ag: NONREACTIVE

## 2021-07-17 NOTE — Progress Notes (Signed)
Future Appointments  Date Time Provider [place]  07/18/2021 10:30 AM Unk Pinto, MD [facility] None  08/10/2021 11:15 AM Bo Merino, MD [facility] None  08/21/2021  9:30 AM Magda Bernheim, NP [facility] None  01/15/2022 10:30 AM Unk Pinto, MD [facility] None  05/15/2022  3:00 PM Unk Pinto, MD [facility] None    History of Present Illness:    Patient is a very nice 80 yo MWF with hx/o HTN, HLD, Pre-Diabetes, Vitamin D Deficiency,GERD & Ulcerative Colitis who presents with a complicated hx/o recently suspected vasculitis with elevated hsCRP  treated with high dose steroids and seen in consultation by Dr Chauncey Cruel. Deveshwar doubting vasculitis & recommending taper of the steroids.  Dr Estanislado Pandy apparently referred patient to Dermatologist - Dr Wilhemina Bonito for Skin Bx to r/o Vasculitis and Dr Ronnald Ramp apparently deferred bx's relating to patient that he felt her shin bruises /ecchymoses were "senile purpura".    Now, she presents with new c/o Left >> Rt hip pains since stopping  Prednisone. She's now also c/o neck- paracervical pains & tenderness extending to her shoulders.    Medications  Current Outpatient Medications (Endocrine & Metabolic):    levothyroxine (SYNTHROID) 125 MCG tablet, TAKE 1 TABLET BY MOUTH DAILY ON AN EMPTY STOMACH WITH ONLY WATER FOR 30 MINUTES AND NO ANTACID MEDS, CALCIUM, OR MAGNESIUM FOR 4 HOURS   PROLIA 60 MG/ML SOSY injection, Inject 60 mg into the skin every 6 (six) months.  Current Outpatient Medications (Cardiovascular):    bisoprolol (ZEBETA) 5 MG tablet, Take by mouth.   colestipol (COLESTID) 1 g tablet, 2 g PO every day - take prior to lunch, at least 1 hour separate from other medications   simvastatin (ZOCOR) 20 MG tablet, TAKE 1 TABLET BY MOUTH AT BEDTIME FOR CHOLESTEROL   losartan (COZAAR) 50 MG tablet, TAKE 1/2 TABLET IN THE EVENING FOR BLOOD PRESSURE GOAL<140/80. IF GREATER THAN THIS IN TWO WEEKS, INCREASE TO 1 TABLET DAILY  Current  Outpatient Medications (Respiratory):    loratadine (CLARITIN) 10 MG tablet, Take by mouth.  Current Outpatient Medications (Analgesics):    acetaminophen (TYLENOL) 500 MG tablet, Take 500 mg by mouth at bedtime as needed for moderate pain or headache.    aspirin EC 81 MG tablet, Take 81 mg by mouth daily.    meloxicam (MOBIC) 15 MG tablet, Take  1/2 to 1 tablet  Daily  with Food  for Pain & Inflammation   Current Outpatient Medications (Other):    Ascorbic Acid (VITAMIN C) 1000 MG tablet, Take 1,000 mg by mouth daily.    Calcium Carbonate-Vitamin D 600-400 MG-UNIT tablet, Take 1 tablet by mouth daily.   Cholecalciferol 50 MCG (2000 UT) TABS, Take by mouth.   Cinnamon 500 MG TABS, Take 500 mg by mouth daily.    Coenzyme Q10 (COQ10) 200 MG CAPS, Take 200 mg by mouth daily.   dicyclomine (BENTYL) 20 MG tablet, Take   1 tablet     3 x /day     before Meals   Multiple Vitamin (THERA VITAMIN PO), Take 1 tablet by mouth daily.   Omega-3 Fatty Acids (FISH OIL) 1200 MG CAPS, Take 1,200 mg by mouth daily.    pantoprazole (PROTONIX) 40 MG tablet, Take 1 tablet  2 x /day  for Heartburn & Indigestion   Polyethyl Glycol-Propyl Glycol 0.4-0.3 % SOLN, Place 1 drop into both eyes at bedtime.   Probiotic Product (PROBIOTIC PO), Take 1 capsule by mouth daily.   Vedolizumab (ENTYVIO  IV), Inject into the vein.  Problem list She has Essential hypertension; Hyperlipidemia, mixed; Hypothyroid; GERD; Asthma; DJD (degenerative joint disease); Vitamin D deficiency; Osteoporosis; Allergy; Medication management; Colitis; Spondylolisthesis at L4-L5 level; Vertigo; Bilateral temporomandibular joint pain; Senile purpura ([place]); Bilateral nephrolithiasis; Body mass index (BMI) of 19.0-19.9 in adult; Complete right bundle branch block (RBBB); and History of ulcerative colitis on their problem list.   Observations/Objective:  BP 120/78   Pulse 78   Temp (!) 97.3 F (36.3 C)   Resp 17   Ht 5' 4"  (1.626 m)   Wt 113 lb  (51.3 kg)   SpO2 96%   BMI 19.40 kg/m   HEENT - WNL. Neck - supple. (+) tender paracertvical muscles extending down to the shoulders.  Chest - Clear equal BS. Cor - Nl HS. RRR w/o sig MGR. PP 1(+). No edema. MS- FROM w/o deformities.  Bilat hip ROM & internal /external ROM is normal , but there is moderate tenderness of the Greater trochanteric bursa Lt>> Rt.  Gait Nl. Neuro -  Nl w/o focal abnormalities. Skin - Ecchymoses / purpura of shins.   Assessment and Plan:   1. Musculoskeletal neck pain  - meloxicam (MOBIC) 15 MG tablet; Take  1/2 to 1 tablet  Daily  with Food  for Pain & Inflammation  Dispense: 90 tablet; Refill: 0  2. Greater trochanteric bursitis of both hips  - meloxicam (MOBIC) 15 MG tablet; Take  1/2 to 1 tablet  Daily  with Food  for Pain & Inflammation  Dispense: 90 tablet; Refill: 0  - Recc 10 day f/u to re-assess  - also recommended try heating pad.   Follow Up Instructions:      I discussed the assessment and treatment plan with the patient. The patient was provided an opportunity to ask questions and all were answered. The patient agreed with the plan and demonstrated an understanding of the instructions.       The patient was advised to call back or seek an in-person evaluation if the symptoms worsen or if the condition fails to improve as anticipated.   Kirtland Bouchard, MD

## 2021-07-17 NOTE — Progress Notes (Signed)
Sed rate is normal, hepatitis B-, hepatitis C negative, IFE abnormal.  Tests for vasculitis were negative.  Cryoglobulin was not performed by the lab.  We can draw it at the follow-up visit unless patient wants to come earlier.  Please refer to hematology for further evaluation.

## 2021-07-18 ENCOUNTER — Telehealth: Payer: Self-pay | Admitting: *Deleted

## 2021-07-18 ENCOUNTER — Ambulatory Visit (INDEPENDENT_AMBULATORY_CARE_PROVIDER_SITE_OTHER): Payer: Medicare Other | Admitting: Internal Medicine

## 2021-07-18 ENCOUNTER — Other Ambulatory Visit: Payer: Self-pay

## 2021-07-18 VITALS — BP 120/78 | HR 78 | Temp 97.3°F | Resp 17 | Ht 64.0 in | Wt 113.0 lb

## 2021-07-18 DIAGNOSIS — R778 Other specified abnormalities of plasma proteins: Secondary | ICD-10-CM

## 2021-07-18 DIAGNOSIS — M7062 Trochanteric bursitis, left hip: Secondary | ICD-10-CM | POA: Diagnosis not present

## 2021-07-18 DIAGNOSIS — M542 Cervicalgia: Secondary | ICD-10-CM | POA: Diagnosis not present

## 2021-07-18 DIAGNOSIS — M7061 Trochanteric bursitis, right hip: Secondary | ICD-10-CM | POA: Diagnosis not present

## 2021-07-18 MED ORDER — MELOXICAM 15 MG PO TABS: ORAL_TABLET | ORAL | 0 refills | Status: DC

## 2021-07-18 NOTE — Telephone Encounter (Signed)
Referral placed 07/17/2021.

## 2021-07-18 NOTE — Telephone Encounter (Signed)
-----   Message from Bo Merino, MD sent at 07/17/2021  1:59 PM EDT ----- Sed rate is normal, hepatitis B-, hepatitis C negative, IFE abnormal.  Tests for vasculitis were negative.  Cryoglobulin was not performed by the lab.  We can draw it at the follow-up visit unless patient wants to come earlier.  Please refer to [facility] ology for further evaluation.

## 2021-07-20 ENCOUNTER — Telehealth: Payer: Self-pay | Admitting: Nurse Practitioner

## 2021-07-20 NOTE — Telephone Encounter (Signed)
Scheduled appt per 8/29 referral. Pt is an established pt of Cira Rue. Scheduled appt, called pt, left msg with appt date and time.

## 2021-07-23 LAB — CRYOGLOBULIN: Cryoglobulin, Qualitative Analysis: NOT DETECTED

## 2021-07-27 ENCOUNTER — Other Ambulatory Visit: Payer: Self-pay

## 2021-07-27 ENCOUNTER — Inpatient Hospital Stay: Payer: Medicare Other | Attending: Nurse Practitioner | Admitting: Hematology

## 2021-07-27 ENCOUNTER — Encounter: Payer: Self-pay | Admitting: Hematology

## 2021-07-27 ENCOUNTER — Inpatient Hospital Stay: Payer: Medicare Other

## 2021-07-27 VITALS — BP 131/82 | HR 95 | Temp 98.4°F | Resp 18 | Ht 64.0 in | Wt 113.8 lb

## 2021-07-27 DIAGNOSIS — E785 Hyperlipidemia, unspecified: Secondary | ICD-10-CM | POA: Diagnosis not present

## 2021-07-27 DIAGNOSIS — E039 Hypothyroidism, unspecified: Secondary | ICD-10-CM | POA: Diagnosis not present

## 2021-07-27 DIAGNOSIS — Z882 Allergy status to sulfonamides status: Secondary | ICD-10-CM | POA: Diagnosis not present

## 2021-07-27 DIAGNOSIS — Z85828 Personal history of other malignant neoplasm of skin: Secondary | ICD-10-CM | POA: Diagnosis not present

## 2021-07-27 DIAGNOSIS — M1990 Unspecified osteoarthritis, unspecified site: Secondary | ICD-10-CM | POA: Diagnosis not present

## 2021-07-27 DIAGNOSIS — M8580 Other specified disorders of bone density and structure, unspecified site: Secondary | ICD-10-CM | POA: Diagnosis not present

## 2021-07-27 DIAGNOSIS — R778 Other specified abnormalities of plasma proteins: Secondary | ICD-10-CM

## 2021-07-27 DIAGNOSIS — Z79899 Other long term (current) drug therapy: Secondary | ICD-10-CM | POA: Diagnosis not present

## 2021-07-27 DIAGNOSIS — I10 Essential (primary) hypertension: Secondary | ICD-10-CM | POA: Insufficient documentation

## 2021-07-27 DIAGNOSIS — R779 Abnormality of plasma protein, unspecified: Secondary | ICD-10-CM | POA: Insufficient documentation

## 2021-07-27 DIAGNOSIS — R7989 Other specified abnormal findings of blood chemistry: Secondary | ICD-10-CM | POA: Insufficient documentation

## 2021-07-27 DIAGNOSIS — Z8719 Personal history of other diseases of the digestive system: Secondary | ICD-10-CM | POA: Diagnosis not present

## 2021-07-27 DIAGNOSIS — R748 Abnormal levels of other serum enzymes: Secondary | ICD-10-CM | POA: Insufficient documentation

## 2021-07-27 NOTE — Progress Notes (Signed)
Debbie Houston   [phone] [phone]   Clinic Follow up Note   Patient Care Team: Unk Pinto, MD as PCP - General (Internal Medicine) Jerrell Belfast, MD as Consulting Physician (Otolaryngology) Kristeen Miss, MD as Consulting Physician (Neurosurgery) Clarene Essex, MD as Consulting Physician (Gastroenterology) Mammography, [facility] (Diagnostic Radiology)  Date of Service:  07/27/2021  CHIEF COMPLAINT: f/u of abnormal SPE  CURRENT THERAPY:  Observation  ASSESSMENT & PLAN:  Debbie Houston is a 80 y.o. female with   1. Abnormal SPEP/IFE -previously seen 04/2021 for hypogammaglobulinemia and abnormal IFE but negative M-protein. Recall she underwent bone scan on 04/06/21 showing only degenerative change. -during work up for vasculitis, she was found to have abnormal IFE on 07/10/21. It showed a poorly-defined band of restricted protein mobility which is reactive with Igg and Kappa antisera. Her work up for vasculitis was negative. -I again discussed the possibility of light chain MGUS with her today, and that this is unlikely in her case given her negative serum M-protein.  She has no signs of multiple myeloma (no anemia, renal failure, hypercalcemia, or bone pain) -I recommend she proceed to repeat lab work today, as well as 24hr urine for light chain. She is agreeable, and I will send her to lab.   PLAN: -proceed to lab for repeat SPEP/IFE/Ligh chain/QIG, and to obtain 24hr urine for UPEP and light chain level.  -I will call with results and recommendations and addendum this note.    No problem-specific Assessment & Plan notes found for this encounter.   INTERVAL HISTORY:  Debbie Houston is here for evaluation/follow up of abnormal SPEP and IFE labs. She was last seen by NP Lacie on 04/27/21 for hypoglobulinemia. She presents to the clinic alone. She reports feeling generally well overall.    All other systems were reviewed with the patient and  are negative.  MEDICAL HISTORY:  Past Medical History:  Diagnosis Date   Arthritis    Benign labile hypertension    Cancer ([place])    skin cancer on face   Colitis    DJD (degenerative joint disease)    Endometrial polyp    GERD (gastroesophageal reflux disease)    History of basal cell carcinoma excision    History of esophagitis    HOH (hard of hearing)    right ear better than left  -- wears no aides   Hyperlipidemia    Hypothyroidism    Osteopenia    Prediabetes    S/P right hip fracture 08/17/2020   Vitamin D deficiency    Wears glasses     SURGICAL HISTORY: Past Surgical History:  Procedure Laterality Date   BACK SURGERY  2020   CATARACT EXTRACTION, BILATERAL Bilateral 2020   DILATATION & CURETTAGE/HYSTEROSCOPY WITH MYOSURE N/A 10/27/2014   Procedure: DILATATION & CURETTAGE/HYSTEROSCOPY WITH MYOSURE;  Surgeon: Darlyn Chamber, MD;  Location: [place];  Service: Gynecology;  Laterality: N/A;   DILATION AND CURETTAGE OF UTERUS     HIP SURGERY Right 04/2020   rod and screw in right hip    MOHS SURGERY  2005   LEFT NASAL BRIDGE FOR BASAL CELL   TONSILLECTOMY  age 46    I have reviewed the social history and family history with the patient and they are unchanged from previous note.  ALLERGIES:  is allergic to fosamax [alendronate sodium], singulair [montelukast sodium], wellbutrin [bupropion], and sulfa antibiotics.  MEDICATIONS:  Current Outpatient Medications  Medication Sig Dispense Refill  acetaminophen (TYLENOL) 500 MG tablet Take 500 mg by mouth at bedtime as needed for moderate pain or headache.      Ascorbic Acid (VITAMIN C) 1000 MG tablet Take 1,000 mg by mouth daily.      aspirin EC 81 MG tablet Take 81 mg by mouth daily.      bisoprolol (ZEBETA) 5 MG tablet Take by mouth.     Calcium Carbonate-Vitamin D 600-400 MG-UNIT tablet Take 1 tablet by mouth daily.     Cholecalciferol 50 MCG (2000 UT) TABS Take by mouth.     Cinnamon 500 MG TABS  Take 500 mg by mouth daily.      Coenzyme Q10 (COQ10) 200 MG CAPS Take 200 mg by mouth daily.     colestipol (COLESTID) 1 g tablet 2 g PO every day - take prior to lunch, at least 1 hour separate from other medications     dicyclomine (BENTYL) 20 MG tablet Take   1 tablet     3 x /day     before Meals 90 tablet 0   levothyroxine (SYNTHROID) 125 MCG tablet TAKE 1 TABLET BY MOUTH DAILY ON AN EMPTY STOMACH WITH ONLY WATER FOR 30 MINUTES AND NO ANTACID MEDS, CALCIUM, OR MAGNESIUM FOR 4 HOURS 90 tablet 0   loratadine (CLARITIN) 10 MG tablet Take by mouth.     losartan (COZAAR) 50 MG tablet TAKE 1/2 TABLET IN THE EVENING FOR BLOOD PRESSURE GOAL<140/80. IF GREATER THAN THIS IN TWO WEEKS, INCREASE TO 1 TABLET DAILY 90 tablet 0   Multiple Vitamin (THERA VITAMIN PO) Take 1 tablet by mouth daily.     Omega-3 Fatty Acids (FISH OIL) 1200 MG CAPS Take 1,200 mg by mouth daily.      pantoprazole (PROTONIX) 40 MG tablet Take 1 tablet  2 x /day  for Heartburn & Indigestion 180 tablet 0   Polyethyl Glycol-Propyl Glycol 0.4-0.3 % SOLN Place 1 drop into both eyes at bedtime.     Probiotic Product (PROBIOTIC PO) Take 1 capsule by mouth daily.     PROLIA 60 MG/ML SOSY injection Inject 60 mg into the skin every 6 (six) months.     simvastatin (ZOCOR) 20 MG tablet TAKE 1 TABLET BY MOUTH AT BEDTIME FOR CHOLESTEROL 90 tablet 1   Vedolizumab (ENTYVIO IV) Inject into the vein.     No current facility-administered medications for this visit.    PHYSICAL EXAMINATION: ECOG PERFORMANCE STATUS: 2 - Symptomatic, <50% confined to bed  Vitals:   07/27/21 1600  BP: 131/82  Pulse: 95  Resp: 18  Temp: 98.4 F (36.9 C)  SpO2: 97%   Wt Readings from Last 3 Encounters:  07/27/21 113 lb 12.8 oz (51.6 kg)  07/18/21 113 lb (51.3 kg)  07/10/21 112 lb 12.8 oz (51.2 kg)     GENERAL:alert, no distress and comfortable SKIN: skin color, texture, turgor are normal, no rashes or significant lesions except diffuse small ecchymosis  in her forearms and bilateral lower extremity. EYES: normal, Conjunctiva are pink and non-injected, sclera clear  NECK: supple, thyroid normal size, non-tender, without nodularity LYMPH:  no palpable lymphadenopathy in the cervical, axillary  LUNGS: clear to auscultation and percussion with normal breathing effort HEART: regular rate & rhythm and no murmurs and no lower extremity edema ABDOMEN:abdomen soft, non-tender and normal bowel sounds Musculoskeletal:no cyanosis of digits and no clubbing  NEURO: alert & oriented x 3 with fluent speech, no focal motor/sensory deficits  LABORATORY DATA:  I have reviewed the data  as listed CBC Latest Ref Rng & Units 06/30/2021 05/15/2021 11/23/2020  WBC 3.8 - 10.8 Thousand/uL 8.2 6.2 6.1  Hemoglobin 11.7 - 15.5 g/dL 12.7 13.4 13.4  Hematocrit 35.0 - 45.0 % 39.1 38.8 38.8  Platelets 140 - 400 Thousand/uL 226 243 200     CMP Latest Ref Rng & Units 07/10/2021 06/30/2021 06/14/2021  Glucose 65 - 99 mg/dL - 90 78  BUN 7 - 25 mg/dL - 9 13  Creatinine 0.60 - 1.00 mg/dL - 0.67 0.80  Sodium 135 - 146 mmol/L - 134(L) 135  Potassium 3.5 - 5.3 mmol/L - 4.6 4.7  Chloride 98 - 110 mmol/L - 101 100  CO2 20 - 32 mmol/L - 26 27  Calcium 8.6 - 10.4 mg/dL - 8.7 9.5  Total Protein 6.1 - 8.1 g/dL 5.9(L) 6.1 -  Total Bilirubin 0.2 - 1.2 mg/dL - 0.7 -  Alkaline Phos 33 - 130 U/L - - -  AST 10 - 35 U/L - 19 -  ALT 6 - 29 U/L - 17 -      RADIOGRAPHIC STUDIES: I have personally reviewed the radiological images as listed and agreed with the findings in the report. No results found.    Orders Placed This Encounter  Procedures   24 Hr Ur UIFE/Light Chains/TP QN    Standing Status:   Future    Standing Expiration Date:   07/27/2022   Kappa/lambda light chains    Standing Status:   Future    Number of Occurrences:   1    Standing Expiration Date:   07/27/2022   Multiple Myeloma Panel (SPEP&IFE w/QIG)    Standing Status:   Future    Number of Occurrences:   1     Standing Expiration Date:   07/27/2022   All questions were answered. The patient knows to call the clinic with any problems, questions or concerns. No barriers to learning was detected. The total time spent in the appointment was 30 minutes.     Truitt Merle, MD 07/27/2021   I, Wilburn Mylar, am acting as scribe for Truitt Merle, MD.   I have reviewed the above documentation for accuracy and completeness, and I agree with the above.

## 2021-07-28 ENCOUNTER — Encounter: Payer: Self-pay | Admitting: Internal Medicine

## 2021-07-28 ENCOUNTER — Ambulatory Visit (INDEPENDENT_AMBULATORY_CARE_PROVIDER_SITE_OTHER): Payer: Medicare Other | Admitting: Internal Medicine

## 2021-07-28 VITALS — BP 124/72 | HR 85 | Temp 97.5°F | Ht 64.0 in | Wt 113.2 lb

## 2021-07-28 DIAGNOSIS — M542 Cervicalgia: Secondary | ICD-10-CM | POA: Diagnosis not present

## 2021-07-28 DIAGNOSIS — M159 Polyosteoarthritis, unspecified: Secondary | ICD-10-CM

## 2021-07-28 LAB — KAPPA/LAMBDA LIGHT CHAINS
Kappa free light chain: 11 mg/L (ref 3.3–19.4)
Kappa, lambda light chain ratio: 1.2 (ref 0.26–1.65)
Lambda free light chains: 9.2 mg/L (ref 5.7–26.3)

## 2021-07-28 MED ORDER — TIZANIDINE HCL 4 MG PO TABS
ORAL_TABLET | ORAL | 0 refills | Status: DC
Start: 2021-07-28 — End: 2021-08-21

## 2021-07-28 NOTE — Progress Notes (Signed)
Future Appointments  Date Time Provider [place]  07/28/2021 10:30 AM Unk Pinto, MD [facility] None  08/10/2021 11:15 AM Bo Merino, MD [facility] None  08/21/2021  9:30 AM Magda Bernheim, NP [facility] None  01/15/2022 10:30 AM Unk Pinto, MD [facility] None  05/15/2022  3:00 PM Unk Pinto, MD [facility] None    History of Present Illness:      Patient is a delightful 80 yo MWF  with hx/o HTN, HLD, Pre-Diabetes, Vitamin D Deficiency,GERD & Ulcerative Colitis  returning for 10 day f/u of treating musculoskeletal neck pain and bilateral  Hip trochanteric Bursitis with Meloxicam.  She relates intolerance to Meloxicam with diarrhea , so she only took one dose !  She's taking Tylenol w/o relief of her neck pains.       She's seen several "Specialists " recently with a myriad of tests ruling out several autoimmune & connective tissue Dz type disorders   Medications    levothyroxine 125 MCG tablet, TAKE 1 TABLET  DAILY    PROLIA, Inject 60 mg into the skin every 6 months.   bisoprolol 5 MG tablet, Take daily   colestipol (COLESTID), 2 g every day - take prior to lunch   losartan  50 MG tablet, TAKE 1/2 -1 tab DAILY   simvastatin  20 MG tablet, TAKE 1 TABLET AT BEDTIME    loratadine 10 MG tablet, Take daily   acetaminophen 500 MG tablet, Take 500 mg at bedtime as needed    aspirin EC 81 MG tablet, Take daily.    VITAMIN C 1000 MG , Take  daily.    Calcium -Vitamin D 600-400 , Take 1 tablet  daily.   Cholecalciferol 2000 u, Take daily   Cinnamon 500 MG TABS, Take  daily.    Coenzyme Q10 200 MG CAPS, Take daily.   dicyclomine 20 MG tablet, Take   1 tablet 3 x /day before Meals   Multiple Vitamin, Take 1 tablet daily.   Omega-3 FISH OIL 1200 MG CAPS, Take daily.    pantoprazole  40 MG tablet, Take 1 tablet  2 x /day    Polyethyl Glycol, Place 1 drop into both eyes at bedtime.   Probiotic , Take 1 capsule  daily.   Vedolizumab (ENTYVIO IV), Inject into the  vein.  Problem list She has Essential hypertension; Hyperlipidemia, mixed; Hypothyroid; GERD; Asthma; DJD (degenerative joint disease); Vitamin D deficiency; Osteoporosis; Allergy; Medication management; Colitis; Spondylolisthesis at L4-L5 level; Vertigo; Bilateral temporomandibular joint pain; Senile purpura ([place]); Bilateral nephrolithiasis; Body mass index (BMI) of 19.0-19.9 in adult; Complete right bundle branch block (RBBB); and History of ulcerative colitis on their problem list.   Observations/Objective:  There were no vitals taken for this visit.  HEENT - WNL. Neck - supple. (+) tender para-cervical spasm Chest - Clear equal BS. Cor - Nl HS. RRR w/o sig MGR. PP 1(+). No edema. MS- FROM w/o deformities.  Gait Nl. Neuro -  Nl w/o focal abnormalities.  Assessment and Plan:   1. Neck pain  - tiZANidine 4 MG tablet; Take  1/2 to 1 tablet  3 x /day  as needed for Muscle Spasm & Pain  Dispense: 90 tablet; Refill: 0  - Discussed "Stretching" & ROM exercises & recommended try heating pad.   2. Osteoarthritis   Follow Up Instructions:      I discussed the assessment and treatment plan with the patient. The patient was provided an opportunity to ask questions and all were answered.  The patient agreed with the plan and demonstrated an understanding of the instructions.       The patient was advised to call back or seek an in-person evaluation if the symptoms worsen or if the condition fails to improve as anticipated.   Kirtland Bouchard, MD

## 2021-07-28 NOTE — Progress Notes (Signed)
Office Visit Note  Patient: Debbie Houston             Date of Birth: 26-Mar-1941           MRN: [id number]             PCP: Unk Pinto, MD Referring: Unk Pinto, MD Visit Date: 08/10/2021 Occupation: @GUAROCC @  Subjective:  Rash.   History of Present Illness: Debbie Houston is a 80 y.o. female who was recently evaluated for the rash.  She states she continues to have pain and stiffness in her neck, lower back and her hands.  She states she was seen in the emergency room over the weekend with high fever.  She was told it was a viral illness.  She states her COVID-19 test was negative.  Activities of Daily Living:  Patient reports morning stiffness for several hours.   Patient Denies nocturnal pain.  Difficulty dressing/grooming: Denies Difficulty climbing stairs: Denies Difficulty getting out of chair: Denies Difficulty using hands for taps, buttons, cutlery, and/or writing: Reports  Review of Systems  Constitutional:  Positive for fatigue.  HENT:  Negative for mouth sores, mouth dryness and nose dryness.   Eyes:  Negative for pain, itching, visual disturbance and dryness.  Respiratory:  Negative for cough, hemoptysis, shortness of breath and difficulty breathing.   Cardiovascular:  Negative for chest pain, palpitations and swelling in legs/feet.  Gastrointestinal:  Positive for diarrhea. Negative for abdominal pain, blood in stool and constipation.  Endocrine: Negative for increased urination.  Genitourinary:  Negative for painful urination.  Musculoskeletal:  Positive for joint pain, joint pain, joint swelling, myalgias, morning stiffness and myalgias. Negative for muscle weakness and muscle tenderness.  Skin:  Positive for rash and redness. Negative for color change.  Allergic/Immunologic: Negative for susceptible to infections.  Neurological:  Positive for dizziness, numbness and headaches. Negative for memory loss and weakness.  Hematological:  Negative for  swollen glands.  Psychiatric/Behavioral:  Negative for confusion and sleep disturbance.    PMFS History:  Patient Active Problem List   Diagnosis Date Noted   History of ulcerative colitis 07/10/2021   Complete right bundle branch block (RBBB) 05/15/2021   Body mass index (BMI) of 19.0-19.9 in adult 05/12/2021   Bilateral nephrolithiasis 09/21/2020   Senile purpura ([place]) 08/17/2020   Bilateral temporomandibular joint pain 06/15/2020   Vertigo 03/25/2019   Spondylolisthesis at L4-L5 level 11/25/2018   Colitis 12/21/2014   Medication management 04/20/2014   Essential hypertension    Hyperlipidemia, mixed    Hypothyroid    GERD    Asthma    DJD (degenerative joint disease)    Vitamin D deficiency    Osteoporosis    Allergy     Past Medical History:  Diagnosis Date   Arthritis    Benign labile hypertension    Cancer (HCC)    skin cancer on face   Colitis    DJD (degenerative joint disease)    Endometrial polyp    GERD (gastroesophageal reflux disease)    History of basal cell carcinoma excision    History of esophagitis    HOH (hard of hearing)    right ear better than left  -- wears no aides   Hyperlipidemia    Hypothyroidism    Osteopenia    Prediabetes    S/P right hip fracture 08/17/2020   Vitamin D deficiency    Wears glasses     Family History  Problem Relation Age of Onset  Hypertension Mother    Cancer Mother        COLON   Hyperlipidemia Mother    Cancer Father        BREAST WITH BRAIN METS   Hyperlipidemia Father    Cancer Sister        COLON (TEENS), BREAST (1)   Hyperlipidemia Sister    Diabetes Brother    Hyperlipidemia Brother    Hypertension Brother    Colon cancer Brother 70   Cancer Maternal Grandmother        RENAL   Hyperlipidemia Maternal Grandmother    Diabetes Maternal Grandfather    Stroke Maternal Grandfather    Hyperlipidemia Maternal Grandfather    Hyperlipidemia Paternal Grandmother    Hyperlipidemia Paternal Grandfather     Autism Son    Colitis Son    Autism spectrum disorder Son    Healthy Son    Past Surgical History:  Procedure Laterality Date   BACK SURGERY  2020   CATARACT EXTRACTION, BILATERAL Bilateral 2020   DILATATION & CURETTAGE/HYSTEROSCOPY WITH MYOSURE N/A 10/27/2014   Procedure: DILATATION & CURETTAGE/HYSTEROSCOPY WITH MYOSURE;  Surgeon: Darlyn Chamber, MD;  Location: [facility];  Service: Gynecology;  Laterality: N/A;   DILATION AND CURETTAGE OF UTERUS     HIP SURGERY Right 04/2020   rod and screw in right hip    MOHS SURGERY  2005   LEFT NASAL BRIDGE FOR BASAL CELL   TONSILLECTOMY  age 54   Social History   Social History Narrative   Not on file   Immunization History  Administered Date(s) Administered   Influenza, High Dose Seasonal PF 07/23/2014, 08/23/2015, 07/10/2016, 08/27/2017, 08/22/2018, 08/25/2019, 08/30/2020   PFIZER(Purple Top)SARS-COV-2 Vaccination 12/10/2019, 12/31/2019, 08/16/2020, 04/14/2021   Pneumococcal Conjugate-13 05/05/2015   Pneumococcal-Unspecified 08/22/2011   Tdap 08/22/2011   Zoster Recombinat (Shingrix) 09/13/2017, 01/01/2018   Zoster, Live 06/20/2006     Objective: Vital Signs: BP 111/70 (BP Location: Left Arm, Patient Position: Sitting, Cuff Size: Normal)   Pulse 89   Ht 5' 4"  (1.626 m)   Wt 110 lb 9.6 oz (50.2 kg)   BMI 18.98 kg/m    Physical Exam Vitals and nursing note reviewed.  Constitutional:      Appearance: She is well-developed.  HENT:     Head: Normocephalic and atraumatic.  Eyes:     Conjunctiva/sclera: Conjunctivae normal.  Cardiovascular:     Rate and Rhythm: Normal rate and regular rhythm.     Heart sounds: Normal heart sounds.  Pulmonary:     Effort: Pulmonary effort is normal.     Breath sounds: Normal breath sounds.  Abdominal:     General: Bowel sounds are normal.     Palpations: Abdomen is soft.  Musculoskeletal:     Cervical back: Normal range of motion.  Lymphadenopathy:     Cervical: No  cervical adenopathy.  Skin:    General: Skin is warm and dry.     Capillary Refill: Capillary refill takes less than 2 seconds.     Comments: Purpuric rash was noted over bilateral forearms consistent with senile purpura.  Neurological:     Mental Status: She is alert and oriented to person, place, and time.  Psychiatric:        Behavior: Behavior normal.     Musculoskeletal Exam: She had limited range of motion for cervical spine and lumbar spine.  Shoulder joints, elbow joints, wrist joints with good range of motion.  She had bilateral PIP and DIP  thickening and subluxation of all DIP joints was noted.  Hip joints and knee joints with good range of motion.  There was no tenderness over ankles or MTPs.  CDAI Exam: CDAI Score: -- Patient Global: --; Provider Global: -- Swollen: --; Tender: -- Joint Exam 08/10/2021   No joint exam has been documented for this visit   There is currently no information documented on the homunculus. Go to the Rheumatology activity and complete the homunculus joint exam.  Investigation: No additional findings.  Imaging: CT ABDOMEN PELVIS W CONTRAST  Result Date: 08/06/2021 CLINICAL DATA:  Generalized abdominal pain.  Fever EXAM: CT ABDOMEN AND PELVIS WITH CONTRAST TECHNIQUE: Multidetector CT imaging of the abdomen and pelvis was performed using the standard protocol following bolus administration of intravenous contrast. CONTRAST:  19m OMNIPAQUE IOHEXOL 350 MG/ML SOLN COMPARISON:  None. FINDINGS: Lower chest: Lung bases are clear. Hepatobiliary: Benign cyst in the RIGHT hepatic lobe. No biliary duct dilatation. Gallbladder normal. Pancreas: Pancreas is normal. No ductal dilatation. No pancreatic inflammation. Spleen: Normal spleen Adrenals/urinary tract: Adrenal glands normal. Bilateral nonobstructing renal calculi. No ureterolithiasis or obstructive uropathy. Normal bladder Stomach/Bowel: Stomach, small bowel, appendix, and cecum are normal. The colon and  rectosigmoid colon are normal. Vascular/Lymphatic: Abdominal aorta is normal caliber with atherosclerotic calcification. There is no retroperitoneal or periportal lymphadenopathy. No pelvic lymphadenopathy. Reproductive: Several calcified leiomyoma of the uterus. Adnexa normal Other: No free fluid Musculoskeletal: Posterior lumbar fusion. Vertebroplasty of the L3 vertebral body. Acute findings IMPRESSION: 1. No acute findings in the abdomen pelvis. 2. Bilateral nonobstructing renal calculi. 3. Posterior lumbar fusion and augmentation.  No acute findings Electronically Signed   By: SSuzy BouchardM.D.   On: 08/06/2021 15:51   DG Chest Portable 1 View  Result Date: 08/06/2021 CLINICAL DATA:  Suspected sepsis. EXAM: PORTABLE CHEST 1 VIEW COMPARISON:  None. FINDINGS: There is a possible nodule in the right mid lung adjacent to the scapula, incompletely evaluated. The heart, hila, mediastinum, lungs, and pleura are otherwise unremarkable. IMPRESSION: 1. Possible nodule in the right mid lung could represent confluence of shadows or a true nodule. A PA and lateral chest x-ray may better evaluate. 2. No cause for sepsis identified. Electronically Signed   By: DDorise BullionIII M.D.   On: 08/06/2021 13:28    Recent Labs: Lab Results  Component Value Date   WBC 11.3 (H) 08/06/2021   HGB 12.1 08/06/2021   PLT 355 08/06/2021   NA 130 (L) 08/06/2021   K 3.8 08/06/2021   CL 92 (L) 08/06/2021   CO2 25 08/06/2021   GLUCOSE 100 (H) 08/06/2021   BUN 12 08/06/2021   CREATININE 0.86 08/06/2021   BILITOT 0.8 08/06/2021   ALKPHOS 49 08/06/2021   AST 17 08/06/2021   ALT 12 08/06/2021   PROT 7.3 08/06/2021   ALBUMIN 4.2 08/06/2021   CALCIUM 10.6 (H) 08/06/2021   GFRAA 86 05/15/2021   July 10, 2021 IFE showed poorly defined area of restricted protein mobility and reactive with IgG and kappa antisera, hepatitis B-, hepatitis C negative, sedimentation rate 11, MPO negative, proteinase 3 negative, ANCA  negative  Speciality Comments: No specialty comments available.  Procedures:  No procedures performed Allergies: Fosamax [alendronate sodium], Singulair [montelukast sodium], Wellbutrin [bupropion], and Sulfa antibiotics   Assessment / Plan:     Visit Diagnoses: Rash and other nonspecific skin eruption-she has purpuric rash, bilateral forearms consistent with senile purpura.  Patient was really concerned about underlying vasculitis.  All her autoimmune work-up was  negative.  Lab findings were discussed with the patient.  Positive ANA (antinuclear antibody) - 10/22/19: ANA 1:80 NS, 01/06/20: ENA negative.  She has no clinical features of autoimmune disease.  Primary osteoarthritis of both hands - severe.  Joint protection muscle strengthening was discussed.  Primary osteoarthritis of both feet-proper fitting shoes were discussed.  DDD (degenerative disc disease), cervical-she has limited range of motion of her cervical spine.  She has ongoing pain in her neck.  She is followed by pain management.  Spondylolisthesis at L4-L5 level - gabapentin per Dr. Ellene Route.  Osteopenia of multiple sites - She had DEXA scan in 2020 which showed osteopenia.  She has been on Prolia injections by Dr. Layne Benton.  Abnormal SPEP-she was referred to hematology and is getting further work-up there.  Fever-patient states she was evaluated in the emergency room for fever last weekend.  She was told that she had a viral illness.  COVID-19 test was negative.  She also had a CT scan of her abdomen and pelvis which she wanted me to review.  I gave her the findings per radiology report.  There was also suspicious nodule in her right lung.  I advised her to schedule an appointment with Dr. Melford Aase.  She may need to repeat CT scan to evaluate this further.  History of ulcerative colitis - She is currently on Entyvio by gastroenterologist at [facility].  Essential hypertension  Vitamin D deficiency  History of asthma  History  of hypothyroidism  History of gastroesophageal reflux (GERD)  History of hyperlipidemia  Orders: No orders of the defined types were placed in this encounter.  No orders of the defined types were placed in this encounter.    Follow-Up Instructions: Return in about 6 months (around 02/07/2022) for Osteoarthritis.   Bo Merino, MD  Note - This record has been created using Editor, commissioning.  Chart creation errors have been sought, but may not always  have been located. Such creation errors do not reflect on  the standard of medical care.

## 2021-07-31 LAB — MULTIPLE MYELOMA PANEL, SERUM
Albumin SerPl Elph-Mcnc: 3 g/dL (ref 2.9–4.4)
Albumin/Glob SerPl: 1.4 (ref 0.7–1.7)
Alpha 1: 0.4 g/dL (ref 0.0–0.4)
Alpha2 Glob SerPl Elph-Mcnc: 0.9 g/dL (ref 0.4–1.0)
B-Globulin SerPl Elph-Mcnc: 0.8 g/dL (ref 0.7–1.3)
Gamma Glob SerPl Elph-Mcnc: 0.3 g/dL — ABNORMAL LOW (ref 0.4–1.8)
Globulin, Total: 2.3 g/dL (ref 2.2–3.9)
IgA: 24 mg/dL — ABNORMAL LOW (ref 64–422)
IgG (Immunoglobin G), Serum: 385 mg/dL — ABNORMAL LOW (ref 586–1602)
IgM (Immunoglobulin M), Srm: 17 mg/dL — ABNORMAL LOW (ref 26–217)
Total Protein ELP: 5.3 g/dL — ABNORMAL LOW (ref 6.0–8.5)

## 2021-08-01 ENCOUNTER — Ambulatory Visit: Payer: Medicare Other | Admitting: Nurse Practitioner

## 2021-08-01 DIAGNOSIS — R7989 Other specified abnormal findings of blood chemistry: Secondary | ICD-10-CM | POA: Diagnosis not present

## 2021-08-01 DIAGNOSIS — I10 Essential (primary) hypertension: Secondary | ICD-10-CM | POA: Diagnosis not present

## 2021-08-01 DIAGNOSIS — E039 Hypothyroidism, unspecified: Secondary | ICD-10-CM | POA: Diagnosis not present

## 2021-08-01 DIAGNOSIS — R748 Abnormal levels of other serum enzymes: Secondary | ICD-10-CM | POA: Diagnosis not present

## 2021-08-01 DIAGNOSIS — S72141K Displaced intertrochanteric fracture of right femur, subsequent encounter for closed fracture with nonunion: Secondary | ICD-10-CM | POA: Diagnosis not present

## 2021-08-01 DIAGNOSIS — E785 Hyperlipidemia, unspecified: Secondary | ICD-10-CM | POA: Diagnosis not present

## 2021-08-01 DIAGNOSIS — R779 Abnormality of plasma protein, unspecified: Secondary | ICD-10-CM | POA: Diagnosis not present

## 2021-08-02 DIAGNOSIS — I10 Essential (primary) hypertension: Secondary | ICD-10-CM | POA: Diagnosis not present

## 2021-08-02 DIAGNOSIS — M47812 Spondylosis without myelopathy or radiculopathy, cervical region: Secondary | ICD-10-CM | POA: Diagnosis not present

## 2021-08-03 LAB — UIFE/LIGHT CHAINS/TP QN, 24-HR UR
FR KAPPA LT CH,24HR: 3.1 mg/24 hr
FR LAMBDA LT CH,24HR: <1.27 mg/24 hr
Free Kappa Lt Chains,Ur: 1.63 mg/L (ref 1.17–86.46)
Free Kappa/Lambda Ratio: >2.43 (ref 1.83–14.26)
Free Lambda Lt Chains,Ur: <0.67 mg/L (ref 0.27–15.21)
Total Protein, Urine-Ur/day: <76 mg/24 hr (ref 30–150)
Total Protein, Urine: <4 mg/dL
Total Volume: 1900

## 2021-08-06 ENCOUNTER — Emergency Department (HOSPITAL_BASED_OUTPATIENT_CLINIC_OR_DEPARTMENT_OTHER)
Admission: EM | Admit: 2021-08-06 | Discharge: 2021-08-06 | Disposition: A | Discharge status: Home / Self Care | Payer: Medicare Other | Attending: Emergency Medicine | Admitting: Emergency Medicine

## 2021-08-06 ENCOUNTER — Encounter (HOSPITAL_BASED_OUTPATIENT_CLINIC_OR_DEPARTMENT_OTHER): Payer: Self-pay | Admitting: Emergency Medicine

## 2021-08-06 ENCOUNTER — Emergency Department (HOSPITAL_BASED_OUTPATIENT_CLINIC_OR_DEPARTMENT_OTHER): Payer: Medicare Other

## 2021-08-06 ENCOUNTER — Other Ambulatory Visit: Payer: Self-pay

## 2021-08-06 ENCOUNTER — Emergency Department (HOSPITAL_BASED_OUTPATIENT_CLINIC_OR_DEPARTMENT_OTHER): Payer: Medicare Other | Admitting: Radiology

## 2021-08-06 DIAGNOSIS — Z87891 Personal history of nicotine dependence: Secondary | ICD-10-CM | POA: Diagnosis not present

## 2021-08-06 DIAGNOSIS — L539 Erythematous condition, unspecified: Secondary | ICD-10-CM | POA: Insufficient documentation

## 2021-08-06 DIAGNOSIS — E039 Hypothyroidism, unspecified: Secondary | ICD-10-CM | POA: Insufficient documentation

## 2021-08-06 DIAGNOSIS — L0100 Impetigo, unspecified: Secondary | ICD-10-CM | POA: Diagnosis not present

## 2021-08-06 DIAGNOSIS — Z7982 Long term (current) use of aspirin: Secondary | ICD-10-CM | POA: Diagnosis not present

## 2021-08-06 DIAGNOSIS — Z20822 Contact with and (suspected) exposure to covid-19: Secondary | ICD-10-CM | POA: Diagnosis not present

## 2021-08-06 DIAGNOSIS — R1013 Epigastric pain: Secondary | ICD-10-CM | POA: Diagnosis not present

## 2021-08-06 DIAGNOSIS — Z85828 Personal history of other malignant neoplasm of skin: Secondary | ICD-10-CM | POA: Insufficient documentation

## 2021-08-06 DIAGNOSIS — Z79899 Other long term (current) drug therapy: Secondary | ICD-10-CM | POA: Insufficient documentation

## 2021-08-06 DIAGNOSIS — B349 Viral infection, unspecified: Secondary | ICD-10-CM | POA: Diagnosis not present

## 2021-08-06 DIAGNOSIS — I10 Essential (primary) hypertension: Secondary | ICD-10-CM | POA: Insufficient documentation

## 2021-08-06 DIAGNOSIS — R109 Unspecified abdominal pain: Secondary | ICD-10-CM | POA: Diagnosis not present

## 2021-08-06 DIAGNOSIS — Z0389 Encounter for observation for other suspected diseases and conditions ruled out: Secondary | ICD-10-CM | POA: Diagnosis not present

## 2021-08-06 DIAGNOSIS — R509 Fever, unspecified: Secondary | ICD-10-CM | POA: Diagnosis present

## 2021-08-06 DIAGNOSIS — R000 Tachycardia, unspecified: Secondary | ICD-10-CM | POA: Diagnosis not present

## 2021-08-06 LAB — CBC WITH DIFFERENTIAL/PLATELET
Abs Immature Granulocytes: 0.19 10*3/uL — ABNORMAL HIGH (ref 0.00–0.07)
Basophils Absolute: 0.1 10*3/uL (ref 0.0–0.1)
Basophils Relative: 0 %
Eosinophils Absolute: 0 10*3/uL (ref 0.0–0.5)
Eosinophils Relative: 0 %
HCT: 36.7 % (ref 36.0–46.0)
Hemoglobin: 12.1 g/dL (ref 12.0–15.0)
Immature Granulocytes: 2 %
Lymphocytes Relative: 6 %
Lymphs Abs: 0.7 10*3/uL (ref 0.7–4.0)
MCH: 30.9 pg (ref 26.0–34.0)
MCHC: 33 g/dL (ref 30.0–36.0)
MCV: 93.9 fL (ref 80.0–100.0)
Monocytes Absolute: 1 10*3/uL (ref 0.1–1.0)
Monocytes Relative: 8 %
Neutro Abs: 9.4 10*3/uL — ABNORMAL HIGH (ref 1.7–7.7)
Neutrophils Relative %: 84 %
Platelets: 355 10*3/uL (ref 150–400)
RBC: 3.91 MIL/uL (ref 3.87–5.11)
RDW: 14.1 % (ref 11.5–15.5)
WBC: 11.3 10*3/uL — ABNORMAL HIGH (ref 4.0–10.5)
nRBC: 0 % (ref 0.0–0.2)

## 2021-08-06 LAB — COMPREHENSIVE METABOLIC PANEL
ALT: 12 U/L (ref 0–44)
AST: 17 U/L (ref 15–41)
Albumin: 4.2 g/dL (ref 3.5–5.0)
Alkaline Phosphatase: 49 U/L (ref 38–126)
Anion gap: 13 (ref 5–15)
BUN: 12 mg/dL (ref 8–23)
CO2: 25 mmol/L (ref 22–32)
Calcium: 10.6 mg/dL — ABNORMAL HIGH (ref 8.9–10.3)
Chloride: 92 mmol/L — ABNORMAL LOW (ref 98–111)
Creatinine, Ser: 0.86 mg/dL (ref 0.44–1.00)
GFR, Estimated: >60 mL/min (ref >60)
Glucose, Bld: 100 mg/dL — ABNORMAL HIGH (ref 70–99)
Potassium: 3.8 mmol/L (ref 3.5–5.1)
Sodium: 130 mmol/L — ABNORMAL LOW (ref 135–145)
Total Bilirubin: 0.8 mg/dL (ref 0.3–1.2)
Total Protein: 7.3 g/dL (ref 6.5–8.1)

## 2021-08-06 LAB — SARS CORONAVIRUS 2 (TAT 6-24 HRS): SARS Coronavirus 2: NEGATIVE

## 2021-08-06 LAB — LACTIC ACID, PLASMA: Lactic Acid, Venous: 0.7 mmol/L (ref 0.5–1.9)

## 2021-08-06 LAB — PROTIME-INR
INR: 1 (ref 0.8–1.2)
Prothrombin Time: 13.4 seconds (ref 11.4–15.2)

## 2021-08-06 MED ORDER — MUPIROCIN CALCIUM 2 % EX CREA: TOPICAL_CREAM | Freq: Once | CUTANEOUS | Status: DC

## 2021-08-06 MED ORDER — ACETAMINOPHEN 500 MG PO TABS
1000.0000 mg | ORAL_TABLET | Freq: Once | ORAL | Status: AC
  Administered 2021-08-06: 1000 mg via ORAL
  Filled 2021-08-06: qty 2

## 2021-08-06 MED ORDER — MUPIROCIN 2 % EX OINT
TOPICAL_OINTMENT | Freq: Once | CUTANEOUS | Status: AC
  Filled 2021-08-06: qty 22

## 2021-08-06 MED ORDER — SODIUM CHLORIDE 0.9 % IV BOLUS
1000.0000 mL | Freq: Once | INTRAVENOUS | Status: AC
  Administered 2021-08-06: 1000 mL via INTRAVENOUS

## 2021-08-06 MED ORDER — IOHEXOL 350 MG/ML SOLN
100.0000 mL | Freq: Once | INTRAVENOUS | Status: AC | PRN
  Administered 2021-08-06: 60 mL via INTRAVENOUS

## 2021-08-06 NOTE — ED Notes (Addendum)
Pt stated neck hurt and was 9 out of 9. Pt stated she also wanted to talk to PA before going. This RN also spoke o the PA about the Pt's Fever and neck Pain. PA acknowledged me and then spoke to Pt. Tylenol and oinment medication given to Pt. Pt alert  oriented and ambulatory before discharge. Pt ambulated from room 7 to the front without any complications. PA stated he was ok with Pt leaving with Temp with administration of Tylenol. Pt encouraged to come back if fever did not break or symptoms got worse. Pt verbalized understanding of discharge instructions

## 2021-08-06 NOTE — ED Notes (Signed)
Patient transported to CT 

## 2021-08-06 NOTE — ED Provider Notes (Addendum)
[facility] EMERGENC[facility] DEPT Provider Note   CSN: [id number] Arrival date & time: 08/06/21  1040     History Chief Complaint  Patient presents with   Fever    Debbie Houston is a 80 y.o. female.  The history is provided by the patient. No language interpreter was used.  Fever  80 year old female significant history of ulcerative colitis currently on Entyvio, hypertension, GERD, prediabetes, hyperlipidemia who presents with subjective fever.  Patient mention for the past 2 to 3 days she have not been feeling well.  She endorsed pain to the left side of her nose with associated swelling.  She also endorsed some mild congestion, follows with body aches, subjective fever, and now having some abdominal discomfort.  Abdominal pain is described as a achy sensation mild to moderate in severity.  No runny nose sneezing coughing chest pain shortness of breath productive cough or dysuria.  No specific treatment tried at home.  She has been vaccinated for COVID-19.  She denies any recent sick contact.  She denies any blood in her stool.  Past Medical History:  Diagnosis Date   Arthritis    Benign labile hypertension    Cancer ([place])    skin cancer on face   Colitis    DJD (degenerative joint disease)    Endometrial polyp    GERD (gastroesophageal reflux disease)    History of basal cell carcinoma excision    History of esophagitis    HOH (hard of hearing)    right ear better than left  -- wears no aides   Hyperlipidemia    Hypothyroidism    Osteopenia    Prediabetes    S/P right hip fracture 08/17/2020   Vitamin D deficiency    Wears glasses     Patient Active Problem List   Diagnosis Date Noted   History of ulcerative colitis 07/10/2021   Complete right bundle branch block (RBBB) 05/15/2021   Body mass index (BMI) of 19.0-19.9 in adult 05/12/2021   Bilateral nephrolithiasis 09/21/2020   Senile purpura ([place]) 08/17/2020   Bilateral temporomandibular joint pain  06/15/2020   Vertigo 03/25/2019   Spondylolisthesis at L4-L5 level 11/25/2018   Colitis 12/21/2014   Medication management 04/20/2014   Essential hypertension    Hyperlipidemia, mixed    Hypothyroid    GERD    Asthma    DJD (degenerative joint disease)    Vitamin D deficiency    Osteoporosis    Allergy     Past Surgical History:  Procedure Laterality Date   BACK SURGERY  2020   CATARACT EXTRACTION, BILATERAL Bilateral 2020   DILATATION & CURETTAGE/HYSTEROSCOPY WITH MYOSURE N/A 10/27/2014   Procedure: DILATATION & CURETTAGE/HYSTEROSCOPY WITH MYOSURE;  Surgeon: Darlyn Chamber, MD;  Location: [facility];  Service: Gynecology;  Laterality: N/A;   DILATION AND CURETTAGE OF UTERUS     HIP SURGERY Right 04/2020   rod and screw in right hip    MOHS SURGERY  2005   LEFT NASAL BRIDGE FOR BASAL CELL   TONSILLECTOMY  age 26     OB History     Gravida  3   Para  2   Term  2   Preterm  0   AB  1   Living  2      SAB  1   IAB  0   Ectopic  0   Multiple  0   Live Births  Family History  Problem Relation Age of Onset   Hypertension Mother    Cancer Mother        COLON   Hyperlipidemia Mother    Cancer Father        BREAST WITH BRAIN METS   Hyperlipidemia Father    Cancer Sister        COLON (TEENS), BREAST (76)   Hyperlipidemia Sister    Diabetes Brother    Hyperlipidemia Brother    Hypertension Brother    Colon cancer Brother 28   Cancer Maternal Grandmother        RENAL   Hyperlipidemia Maternal Grandmother    Diabetes Maternal Grandfather    Stroke Maternal Grandfather    Hyperlipidemia Maternal Grandfather    Hyperlipidemia Paternal Grandmother    Hyperlipidemia Paternal Grandfather    Autism Son    Colitis Son    Autism spectrum disorder Son    Healthy Son     Social History   Tobacco Use   Smoking status: Former    Years: 15.00    Types: Cigarettes    Quit date: 2002    Years since quitting: 20.7    Smokeless tobacco: Never   Tobacco comments:    Former light smoker, quit 15+ years ago, can't recall  Vaping Use   Vaping Use: Never used  Substance Use Topics   Alcohol use: Yes    Comment: rarely    Drug use: No    Home Medications Prior to Admission medications   Medication Sig Start Date End Date Taking? Authorizing Provider  acetaminophen (TYLENOL) 500 MG tablet Take 500 mg by mouth at bedtime as needed for moderate pain or headache.     [provider]  Ascorbic Acid (VITAMIN C) 1000 MG tablet Take 1,000 mg by mouth daily.     [provider]  aspirin EC 81 MG tablet Take 81 mg by mouth daily.     [provider]  bisoprolol (ZEBETA) 5 MG tablet Take by mouth. 05/15/21   [provider]  Calcium Carbonate-Vitamin D 600-400 MG-UNIT tablet Take 1 tablet by mouth daily.    [provider]  Cholecalciferol 50 MCG (2000 UT) TABS Take by mouth.    [provider]  Cinnamon 500 MG TABS Take 500 mg by mouth daily.     [provider]  Coenzyme Q10 (COQ10) 200 MG CAPS Take 200 mg by mouth daily.    [provider]  colestipol (COLESTID) 1 g tablet 2 g PO every day - take prior to lunch, at least 1 hour separate from other medications 07/26/20   [provider]  dicyclomine (BENTYL) 20 MG tablet Take   1 tablet     3 x /day     before Meals 11/24/20   Unk Pinto, MD  levothyroxine (SYNTHROID) 125 MCG tablet TAKE 1 TABLET BY MOUTH DAILY ON AN EMPTY STOMACH WITH ONLY WATER FOR 30 MINUTES AND NO ANTACID MEDS, CALCIUM, OR MAGNESIUM FOR 4 HOURS 05/31/21   Magda Bernheim, NP  loratadine (CLARITIN) 10 MG tablet Take by mouth.    [provider]  Multiple Vitamin (THERA VITAMIN PO) Take 1 tablet by mouth daily.    [provider]  Omega-3 Fatty Acids (FISH OIL) 1200 MG CAPS Take 1,200 mg by mouth daily.     [provider]  pantoprazole (PROTONIX) 40 MG tablet Take 1 tablet  2 x /day  for  Heartburn & Indigestion 03/08/20  Unk Pinto, MD  Polyethyl Glycol-Propyl Glycol 0.4-0.3 % SOLN Place 1 drop into both eyes at bedtime.    [provider]  Probiotic Product (PROBIOTIC PO) Take 1 capsule by mouth daily.    [provider]  PROLIA 60 MG/ML SOSY injection Inject 60 mg into the skin every 6 (six) months. 01/28/20   [provider]  simvastatin (ZOCOR) 20 MG tablet TAKE 1 TABLET BY MOUTH AT BEDTIME FOR CHOLESTEROL 04/24/21   Liane Comber, NP  tiZANidine (ZANAFLEX) 4 MG tablet Take  1/2 to 1 tablet  3 x /day  as needed for Muscle Spasm & Pain 07/28/21   Unk Pinto, MD  Vedolizumab (ENTYVIO IV) Inject into the vein.    [provider]    Allergies    Fosamax [alendronate sodium], Singulair [montelukast sodium], Wellbutrin [bupropion], and Sulfa antibiotics  Review of Systems   Review of Systems  Constitutional:  Positive for fever.  All other systems reviewed and are negative.  Physical Exam Updated Vital Signs BP (!) 134/95   Pulse (!) 117   Temp 99.5 F (37.5 C) (Oral)   Resp 18   SpO2 100%   Physical Exam Vitals and nursing note reviewed.  Constitutional:      General: She is not in acute distress.    Appearance: She is well-developed.  HENT:     Head: Atraumatic.     Right Ear: Tympanic membrane normal.     Left Ear: Tympanic membrane normal.     Nose:     Comments: Left nares is erythematous and edematous to the lateral conchae, and tender to palpation.  No external skin erythema. Eyes:     Conjunctiva/sclera: Conjunctivae normal.  Cardiovascular:     Rate and Rhythm: Tachycardia present.     Pulses: Normal pulses.     Heart sounds: Normal heart sounds.  Pulmonary:     Effort: Pulmonary effort is normal.  Abdominal:     Palpations: Abdomen is soft.     Tenderness: There is abdominal tenderness (And tenderness to epigastric region on palpation no guarding or rebound tenderness.).  Musculoskeletal:      Cervical back: Neck supple.  Skin:    Findings: No rash.  Neurological:     Mental Status: She is alert. Mental status is at baseline.  Psychiatric:        Mood and Affect: Mood normal.    ED Results / Procedures / Treatments   Labs (all labs ordered are listed, but only abnormal results are displayed) Labs Reviewed  COMPREHENSIVE METABOLIC PANEL - Abnormal; Notable for the following components:      Result Value   Sodium 130 (*)    Chloride 92 (*)    Glucose, Bld 100 (*)    Calcium 10.6 (*)    All other components within normal limits  CBC WITH DIFFERENTIAL/PLATELET - Abnormal; Notable for the following components:   WBC 11.3 (*)    Neutro Abs 9.4 (*)    Abs Immature Granulocytes 0.19 (*)    All other components within normal limits  CULTURE, BLOOD (ROUTINE X 2)  CULTURE, BLOOD (ROUTINE X 2)  SARS CORONAVIRUS 2 (TAT 6-24 HRS)  LACTIC ACID, PLASMA  PROTIME-INR  LACTIC ACID, PLASMA  URINALYSIS, ROUTINE W REFLEX MICROSCOPIC    EKG None ED ECG REPORT   Date: 08/06/2021  Rate: 122  Rhythm: sinus tachycardia  QRS Axis: normal  Intervals: normal  ST/T Wave abnormalities: early repolarization  Conduction Disutrbances:right bundle branch block  Narrative Interpretation:   Old EKG Reviewed: unchanged  I have personally reviewed the EKG tracing and agree with the computerized printout as noted.   Radiology CT ABDOMEN PELVIS W CONTRAST  Result Date: 08/06/2021 CLINICAL DATA:  Generalized abdominal pain.  Fever EXAM: CT ABDOMEN AND PELVIS WITH CONTRAST TECHNIQUE: Multidetector CT imaging of the abdomen and pelvis was performed using the standard protocol following bolus administration of intravenous contrast. CONTRAST:  40m OMNIPAQUE IOHEXOL 350 MG/ML SOLN COMPARISON:  None. FINDINGS: Lower chest: Lung bases are clear. Hepatobiliary: Benign cyst in the RIGHT hepatic lobe. No biliary duct dilatation. Gallbladder normal. Pancreas: Pancreas is normal. No ductal dilatation. No  pancreatic inflammation. Spleen: Normal spleen Adrenals/urinary tract: Adrenal glands normal. Bilateral nonobstructing renal calculi. No ureterolithiasis or obstructive uropathy. Normal bladder Stomach/Bowel: Stomach, small bowel, appendix, and cecum are normal. The colon and rectosigmoid colon are normal. Vascular/Lymphatic: Abdominal aorta is normal caliber with atherosclerotic calcification. There is no retroperitoneal or periportal lymphadenopathy. No pelvic lymphadenopathy. Reproductive: Several calcified leiomyoma of the uterus. Adnexa normal Other: No free fluid Musculoskeletal: Posterior lumbar fusion. Vertebroplasty of the L3 vertebral body. Acute findings IMPRESSION: 1. No acute findings in the abdomen pelvis. 2. Bilateral nonobstructing renal calculi. 3. Posterior lumbar fusion and augmentation.  No acute findings Electronically Signed   By: SSuzy BouchardM.D.   On: 08/06/2021 15:51   DG Chest Portable 1 View  Result Date: 08/06/2021 CLINICAL DATA:  Suspected sepsis. EXAM: PORTABLE CHEST 1 VIEW COMPARISON:  None. FINDINGS: There is a possible nodule in the right mid lung adjacent to the scapula, incompletely evaluated. The heart, hila, mediastinum, lungs, and pleura are otherwise unremarkable. IMPRESSION: 1. Possible nodule in the right mid lung could represent confluence of shadows or a true nodule. A PA and lateral chest x-ray may better evaluate. 2. No cause for sepsis identified. Electronically Signed   By: DDorise BullionIII M.D.   On: 08/06/2021 13:28    Procedures Procedures   Medications Ordered in ED Medications  mupirocin ointment (BACTROBAN) 2 % (has no administration in time range)  sodium chloride 0.9 % bolus 1,000 mL (0 mLs Intravenous Stopped 08/06/21 1440)  iohexol (OMNIPAQUE) 350 MG/ML injection 100 mL (60 mLs Intravenous Contrast Given 08/06/21 1510)    ED Course  I have reviewed the triage vital signs and the nursing notes.  Pertinent labs & imaging results that  were available during my care of the patient were reviewed by me and considered in my medical decision making (see chart for details).    MDM Rules/Calculators/A&P                           BP (!) 158/96   Pulse (!) 124   Temp 98.2 F (36.8 C) (Oral)   Resp 18   Ht 5' 4.5" (1.638 m)   Wt 51.3 kg   SpO2 97%   BMI 19.11 kg/m   Final Clinical Impression(s) / ED Diagnoses Final diagnoses:  Viral illness  Impetigo  Tachycardia    Rx / DC Orders ED Discharge Orders     None      12:35 PM Patient here with complaints of not feeling well.  She has some cold symptoms including sinus congestion subjective fever and myalgia.  She has been vaccinated for COVID-19 but it would be reasonable to obtain a COVID test.  Furthermore she also endorsed some upper abdominal discomfort without nausea vomiting or diarrhea.  History of ulcerative colitis  currently on suppressive medication.  Plan to obtain abdominal and pelvis CT scan for further assessment as well.  She is tachycardic with a heart rate of 117, IV fluid given, oral temperature is 99.5, will check rectal temperature.  She is without hypoxia.  Blood pressures within normal limit.  Care discussed with Dr. Jeanell Sparrow.   4:28 PM Mild hyponatremia with a sodium of 130, IVF given.  Mild leukocytosis with WBC 11.3 but the remainder of her labs are reassuring.  COVID test is currently pending.  Chest x-ray shows a possible nodule in the right midlung that could represent confluence of shadows or a true nodule.  A PA and lateral chest x-ray may be better evaluated.  No cause of sepsis identified on chest x-ray.  Since patient is without any cough or shortness of breath, I will defer outpatient follow-up chest x-ray.  An abdominal pelvis CT scan without any acute finding.  EKG shows sinus tachycardia with evidence of right bundle branch block however patient has had right bundle branch block from last EKG.  Her heart rate does fluctuate even while I was  in the room.  I suspect tachycardia likely due to anxiety and less likely due to her PE or hypovolemia.  Patient did receive IV fluid.  At this time she request to be discharged.  No acute emergent condition identified.  Discussed with patient that COVID test is currently pending and she can follow-up results through [facility].  Return precaution given.  5:06 PM With pt was getting ready to leave, she felt hot, oral temp of 103.  Suspect fever 2/2 covid infection.  Test currently pending.  Tachycardia likely 2/2 fever.  Pt is not hypoxic, can be discharge home.  Antipyretic recommended.        Domenic Moras, PA-C 08/06/21 1707    Pattricia Boss, MD 08/07/21 [phone]

## 2021-08-06 NOTE — Discharge Instructions (Addendum)
You have subjective fever chills body aches which is likely due to a viral illness.  Please follow-up on your COVID test through MyChart, link below.  You do have an infection inside your nose on the left side.  Please apply mupirocin cream inside the nares twice daily for the next 10 days.  Your heart rate is elevated today.  Please have that rechecked.  An x-ray of your chest shows a small nodule on the right side of your lung, follow-up with your doctor for a repeat chest x-ray to ensure resolution.  Return to the ER if you have any concern.

## 2021-08-06 NOTE — ED Triage Notes (Signed)
Pt presents with generalized not feeling well. Pt reports fever of 99.5 for 3 days. Nose sore and congested.

## 2021-08-08 ENCOUNTER — Ambulatory Visit: Payer: Medicare Other | Admitting: Rheumatology

## 2021-08-09 ENCOUNTER — Other Ambulatory Visit: Payer: Self-pay | Admitting: Adult Health

## 2021-08-09 DIAGNOSIS — M810 Age-related osteoporosis without current pathological fracture: Secondary | ICD-10-CM | POA: Diagnosis not present

## 2021-08-09 DIAGNOSIS — S72001K Fracture of unspecified part of neck of right femur, subsequent encounter for closed fracture with nonunion: Secondary | ICD-10-CM | POA: Diagnosis not present

## 2021-08-10 ENCOUNTER — Ambulatory Visit: Payer: Medicare Other | Admitting: Family Medicine

## 2021-08-10 ENCOUNTER — Ambulatory Visit (INDEPENDENT_AMBULATORY_CARE_PROVIDER_SITE_OTHER): Payer: Medicare Other | Admitting: Rheumatology

## 2021-08-10 ENCOUNTER — Other Ambulatory Visit: Payer: Self-pay

## 2021-08-10 ENCOUNTER — Encounter: Payer: Self-pay | Admitting: Rheumatology

## 2021-08-10 VITALS — BP 111/70 | HR 89 | Ht 64.0 in | Wt 110.6 lb

## 2021-08-10 DIAGNOSIS — M19041 Primary osteoarthritis, right hand: Secondary | ICD-10-CM | POA: Diagnosis not present

## 2021-08-10 DIAGNOSIS — I10 Essential (primary) hypertension: Secondary | ICD-10-CM

## 2021-08-10 DIAGNOSIS — Z8719 Personal history of other diseases of the digestive system: Secondary | ICD-10-CM

## 2021-08-10 DIAGNOSIS — M19071 Primary osteoarthritis, right ankle and foot: Secondary | ICD-10-CM | POA: Diagnosis not present

## 2021-08-10 DIAGNOSIS — R778 Other specified abnormalities of plasma proteins: Secondary | ICD-10-CM | POA: Diagnosis not present

## 2021-08-10 DIAGNOSIS — E559 Vitamin D deficiency, unspecified: Secondary | ICD-10-CM

## 2021-08-10 DIAGNOSIS — R768 Other specified abnormal immunological findings in serum: Secondary | ICD-10-CM | POA: Diagnosis not present

## 2021-08-10 DIAGNOSIS — M4316 Spondylolisthesis, lumbar region: Secondary | ICD-10-CM

## 2021-08-10 DIAGNOSIS — Z8639 Personal history of other endocrine, nutritional and metabolic disease: Secondary | ICD-10-CM

## 2021-08-10 DIAGNOSIS — Z8709 Personal history of other diseases of the respiratory system: Secondary | ICD-10-CM

## 2021-08-10 DIAGNOSIS — R21 Rash and other nonspecific skin eruption: Secondary | ICD-10-CM

## 2021-08-10 DIAGNOSIS — M8589 Other specified disorders of bone density and structure, multiple sites: Secondary | ICD-10-CM

## 2021-08-10 DIAGNOSIS — M19042 Primary osteoarthritis, left hand: Secondary | ICD-10-CM

## 2021-08-10 DIAGNOSIS — M5030 Other cervical disc degeneration, unspecified cervical region: Secondary | ICD-10-CM

## 2021-08-10 DIAGNOSIS — M19072 Primary osteoarthritis, left ankle and foot: Secondary | ICD-10-CM

## 2021-08-10 NOTE — Patient Instructions (Signed)
Cervical Strain and Sprain Rehab Ask your health care provider which exercises are safe for you. Do exercises exactly as told by your health care provider and adjust them as directed. It is normal to feel mild stretching, pulling, tightness, or discomfort as you do these exercises. Stop right away if you feel sudden pain or your pain gets worse. Do not begin these exercises until told by your health care provider. Stretching and range-of-motion exercises Cervical side bending  Using good posture, sit on a stable chair or stand up. Without moving your shoulders, slowly tilt your left / right ear to your shoulder until you feel a stretch in the opposite side neck muscles. You should be looking straight ahead. Hold for __________ seconds. Repeat with the other side of your neck. Repeat __________ times. Complete this exercise __________ times a day. Cervical rotation  Using good posture, sit on a stable chair or stand up. Slowly turn your head to the side as if you are looking over your left / right shoulder. Keep your eyes level with the ground. Stop when you feel a stretch along the side and the back of your neck. Hold for __________ seconds. Repeat this by turning to your other side. Repeat __________ times. Complete this exercise __________ times a day. Thoracic extension and pectoral stretch Roll a towel or a small blanket so it is about 4 inches (10 cm) in diameter. Lie down on your back on a firm surface. Put the towel lengthwise, under your spine in the middle of your back. It should not be under your shoulder blades. The towel should line up with your spine from your middle back to your lower back. Put your hands behind your head and let your elbows fall out to your sides. Hold for __________ seconds. Repeat __________ times. Complete this exercise __________ times a day. Strengthening exercises Isometric upper cervical flexion Lie on your back with a thin pillow behind your head  and a small rolled-up towel under your neck. Gently tuck your chin toward your chest and nod your head down to look toward your feet. Do not lift your head off the pillow. Hold for __________ seconds. Release the tension slowly. Relax your neck muscles completely before you repeat this exercise. Repeat __________ times. Complete this exercise __________ times a day. Isometric cervical extension  Stand about 6 inches (15 cm) away from a wall, with your back facing the wall. Place a soft object, about 6-8 inches (15-20 cm) in diameter, between the back of your head and the wall. A soft object could be a small pillow, a ball, or a folded towel. Gently tilt your head back and press into the soft object. Keep your jaw and forehead relaxed. Hold for __________ seconds. Release the tension slowly. Relax your neck muscles completely before you repeat this exercise. Repeat __________ times. Complete this exercise __________ times a day. Posture and body mechanics Body mechanics refers to the movements and positions of your body while you do your daily activities. Posture is part of body mechanics. Good posture and healthy body mechanics can help to relieve stress in your body's tissues and joints. Good posture means that your spine is in its natural S-curve position (your spine is neutral), your shoulders are pulled back slightly, and your head is not tipped forward. The following are general guidelines for applying improved posture and body mechanics to your everyday activities. Sitting  When sitting, keep your spine neutral and keep your feet flat on the floor.  Use a footrest, if necessary, and keep your thighs parallel to the floor. Avoid rounding your shoulders, and avoid tilting your head forward. When working at a desk or a computer, keep your desk at a height where your hands are slightly lower than your elbows. Slide your chair under your desk so you are close enough to maintain good posture. When  working at a computer, place your monitor at a height where you are looking straight ahead and you do not have to tilt your head forward or downward to look at the screen. Standing  When standing, keep your spine neutral and keep your feet about hip-width apart. Keep a slight bend in your knees. Your ears, shoulders, and hips should line up. When you do a task in which you stand in one place for a long time, place one foot up on a stable object that is 2-4 inches (5-10 cm) high, such as a footstool. This helps keep your spine neutral. Resting When lying down and resting, avoid positions that are most painful for you. Try to support your neck in a neutral position. You can use a contour pillow or a small rolled-up towel. Your pillow should support your neck but not push on it. This information is not intended to replace advice given to you by your health care provider. Make sure you discuss any questions you have with your health care provider. Document Revised: 02/25/2019 Document Reviewed: 08/06/2018 Elsevier Patient Education  [place] Exercises Hand exercises can be helpful for almost anyone. These exercises can strengthen the hands, improve flexibility and movement, and increase blood flow to the hands. These results can make work and daily tasks easier. Hand exercises can be especially helpful for people who have joint pain from arthritis or have nerve damage from overuse (carpal tunnel syndrome). These exercises can also help people who have injured a hand. Exercises Most of these hand exercises are gentle stretching and motion exercises. It is usually safe to do them often throughout the day. Warming up your hands before exercise may help to reduce stiffness. You can do this with gentle massage or by placing your hands in warm water for 10-15 minutes. It is normal to feel some stretching, pulling, tightness, or mild discomfort as you begin new exercises. This will gradually  improve. Stop an exercise right away if you feel sudden, severe pain or your pain gets worse. Ask your health care provider which exercises are best for you. Knuckle bend or "claw" fist  Stand or sit with your arm, hand, and all five fingers pointed straight up. Make sure to keep your wrist straight during the exercise. Gently bend your fingers down toward your palm until the tips of your fingers are touching the top of your palm. Keep your big knuckle straight and just bend the small knuckles in your fingers. Hold this position for __________ seconds. Straighten (extend) your fingers back to the starting position. Repeat this exercise 5-10 times with each hand. Full finger fist  Stand or sit with your arm, hand, and all five fingers pointed straight up. Make sure to keep your wrist straight during the exercise. Gently bend your fingers into your palm until the tips of your fingers are touching the middle of your palm. Hold this position for __________ seconds. Extend your fingers back to the starting position, stretching every joint fully. Repeat this exercise 5-10 times with each hand. Straight fist Stand or sit with your arm, hand, and all five fingers  pointed straight up. Make sure to keep your wrist straight during the exercise. Gently bend your fingers at the big knuckle, where your fingers meet your hand, and the middle knuckle. Keep the knuckle at the tips of your fingers straight and try to touch the bottom of your palm. Hold this position for __________ seconds. Extend your fingers back to the starting position, stretching every joint fully. Repeat this exercise 5-10 times with each hand. Tabletop  Stand or sit with your arm, hand, and all five fingers pointed straight up. Make sure to keep your wrist straight during the exercise. Gently bend your fingers at the big knuckle, where your fingers meet your hand, as far down as you can while keeping the small knuckles in your fingers  straight. Think of forming a tabletop with your fingers. Hold this position for __________ seconds. Extend your fingers back to the starting position, stretching every joint fully. Repeat this exercise 5-10 times with each hand. Finger spread  Place your hand flat on a table with your palm facing down. Make sure your wrist stays straight as you do this exercise. Spread your fingers and thumb apart from each other as far as you can until you feel a gentle stretch. Hold this position for __________ seconds. Bring your fingers and thumb tight together again. Hold this position for __________ seconds. Repeat this exercise 5-10 times with each hand. Making circles  Stand or sit with your arm, hand, and all five fingers pointed straight up. Make sure to keep your wrist straight during the exercise. Make a circle by touching the tip of your thumb to the tip of your index finger. Hold for __________ seconds. Then open your hand wide. Repeat this motion with your thumb and each finger on your hand. Repeat this exercise 5-10 times with each hand. Thumb motion  Sit with your forearm resting on a table and your wrist straight. Your thumb should be facing up toward the ceiling. Keep your fingers relaxed as you move your thumb. Lift your thumb up as high as you can toward the ceiling. Hold for __________ seconds. Bend your thumb across your palm as far as you can, reaching the tip of your thumb for the small finger (pinkie) side of your palm. Hold for __________ seconds. Repeat this exercise 5-10 times with each hand. Grip strengthening  Hold a stress ball or other soft ball in the middle of your hand. Slowly increase the pressure, squeezing the ball as much as you can without causing pain. Think of bringing the tips of your fingers into the middle of your palm. All of your finger joints should bend when doing this exercise. Hold your squeeze for __________ seconds, then relax. Repeat this exercise  5-10 times with each hand. Contact a health care provider if: Your hand pain or discomfort gets much worse when you do an exercise. Your hand pain or discomfort does not improve within 2 hours after you exercise. If you have any of these problems, stop doing these exercises right away. Do not do them again unless your health care provider says that you can. Get help right away if: You develop sudden, severe hand pain or swelling. If this happens, stop doing these exercises right away. Do not do them again unless your health care provider says that you can. This information is not intended to replace advice given to you by your health care provider. Make sure you discuss any questions you have with your health care provider. Document  Revised: 02/23/2021 Document Reviewed: 02/23/2021 Elsevier Patient Education  [place].

## 2021-08-14 LAB — CULTURE, BLOOD (ROUTINE X 2)
Culture: NO GROWTH
Culture: NO GROWTH
Special Requests: ADEQUATE
Special Requests: ADEQUATE

## 2021-08-14 NOTE — Progress Notes (Signed)
Future Appointments  Date Time Provider [place]  08/15/2021  2:30 PM Unk Pinto, MD [facility] None  08/21/2021  9:30 AM Magda Bernheim, NP [facility] None  01/15/2022 10:30 AM Unk Pinto, MD [facility] None  02/08/2022 10:20 AM Ofilia Neas, PA-C [facility] None  05/15/2022  3:00 PM Unk Pinto, MD [facility] None    History of Present Illness:     Patient is a very nice 80 yo MWF presenting with c/o sinus congestion & pressure for about 2 weeks . Relates that about 1 week ago went to [facility] ER and was dx'd with a viral Illness.  Also she apparently was suspected to have impetigo about the Lt nares and was Rx'd Bactroban.    Medications    levothyroxine (SYNTHROID) 125 MCG tablet, TAKE 1 TABLET BY MOUTH DAILY ON AN EMPTY STOMACH WITH ONLY WATER FOR 30 MINUTES AND NO ANTACID MEDS, CALCIUM, OR MAGNESIUM FOR 4 HOURS   PROLIA 60 MG/ML SOSY injection, Inject 60 mg into the skin every 6 (six) months.   bisoprolol (ZEBETA) 5 MG tablet, TAKE 1/2 TABLET BY MOUTH EVERY DAY IN THE EVENING FOR BLOOD PRESSURE   colestipol (COLESTID) 1 g tablet,    simvastatin (ZOCOR) 20 MG tablet, TAKE 1 TABLET BY MOUTH AT BEDTIME FOR CHOLESTEROL   loratadine (CLARITIN) 10 MG tablet, Take by mouth.Take 500 mg by mouth at bedtime as needed for moderate pain or headache.    aspirin EC 81 MG tablet, Take 81 mg by mouth daily.   Ascorbic Acid (VITAMIN C) 1000 MG tablet, Take 1,000 mg by mouth daily.    Calcium Carbonate-Vitamin D 600-400 MG-UNIT tablet, Take 1 tablet by mouth daily.   Cholecalciferol 50 MCG (2000 UT) TABS, Take by mouth.   Cinnamon 500 MG TABS, Take 500 mg by mouth daily.    Coenzyme Q10 (COQ10) 200 MG CAPS, Take 200 mg by mouth daily.   dicyclomine (BENTYL) 20 MG tablet, Take   1 tablet     3 x /day     before Meals   Multiple Vitamin (THERA VITAMIN PO), Take 1 tablet by mouth daily.   Omega-3 Fatty Acids (FISH OIL) 1200 MG CAPS, Take 1,200 mg by mouth daily.     pantoprazole (PROTONIX) 40 MG tablet, Take 1 tablet  2 x /day  for Heartburn & Indigestion   Polyethyl Glycol-Propyl Glycol 0.4-0.3 % SOLN, Place 1 drop into both eyes at bedtime.   Probiotic Product (PROBIOTIC PO), Take 1 capsule by mouth daily.   tiZANidine (ZANAFLEX) 4 MG tablet, Take  1/2 to 1 tablet  3 x /day  as needed for Muscle Spasm & Pain (Patient taking differently: Take  1/2 to 1 tablet  3 x /day  as needed for Muscle Spasm & Pain)   Vedolizumab (ENTYVIO IV), Inject into the vein.  Problem list She has Essential hypertension; Hyperlipidemia, mixed; Hypothyroid; GERD; Asthma; DJD (degenerative joint disease); Vitamin D deficiency; Osteoporosis; Allergy; Medication management; Colitis; Spondylolisthesis at L4-L5 level; Vertigo; Bilateral temporomandibular joint pain; Senile purpura ([place]); Bilateral nephrolithiasis; Body mass index (BMI) of 19.0-19.9 in adult; Complete right bundle branch block (RBBB); and History of ulcerative colitis on their problem list.   Observations/Objective:  BP 130/80   Pulse 79   Temp (!) 97.3 F (36.3 C)   Ht 5' 4"  (1.626 m)   Wt 107 lb 12.8 oz (48.9 kg)   SpO2 97%   BMI 18.50 kg/m   HEENT - EAC's / TM's -Nl. (+)  Tender frontomaxillary areas . Left nares red, swollen with ulceration inside nares.  O/P - clear Neck - supple.  Chest - Clear equal BS. Cor - Nl HS. RRR w/o sig MGR. PP 1(+). No edema. MS- FROM w/o deformities.  Gait Nl. Neuro -  Nl w/o focal abnormalities.  Assessment and Plan:   1. Acute non-recurrent pansinusitis  - dexamethasone 4 MG tablet; Take 1 tab 3 x day - 3 days, then 2 x day - 3 days, then 1 tab daily   Dispense: 20 tablet;  2. Impetigo  - cephALEXin  500 MG capsule;  Take 1 capsule 4 x /day with Meals & Bedtime  Dispense: 40 capsule   Follow Up Instructions:        I discussed the assessment and treatment plan with the patient. The patient was provided an opportunity to ask questions and all were answered.  The patient agreed with the plan and demonstrated an understanding of the instructions.       The patient was advised to call back or seek an in-person evaluation if the symptoms worsen or if the condition fails to improve as anticipated.    Kirtland Bouchard, MD

## 2021-08-15 ENCOUNTER — Encounter: Payer: Self-pay | Admitting: Internal Medicine

## 2021-08-15 ENCOUNTER — Other Ambulatory Visit: Payer: Self-pay

## 2021-08-15 ENCOUNTER — Ambulatory Visit (INDEPENDENT_AMBULATORY_CARE_PROVIDER_SITE_OTHER): Payer: Medicare Other | Admitting: Internal Medicine

## 2021-08-15 VITALS — BP 130/80 | HR 79 | Temp 97.3°F | Ht 64.0 in | Wt 107.8 lb

## 2021-08-15 DIAGNOSIS — J0140 Acute pansinusitis, unspecified: Secondary | ICD-10-CM

## 2021-08-15 DIAGNOSIS — L0100 Impetigo, unspecified: Secondary | ICD-10-CM

## 2021-08-15 MED ORDER — DEXAMETHASONE 4 MG PO TABS: ORAL_TABLET | ORAL | 0 refills | Status: DC

## 2021-08-15 MED ORDER — CEPHALEXIN 500 MG PO CAPS: ORAL_CAPSULE | ORAL | 0 refills | Status: DC

## 2021-08-17 ENCOUNTER — Other Ambulatory Visit: Payer: Self-pay | Admitting: Internal Medicine

## 2021-08-17 DIAGNOSIS — K50919 Crohn's disease, unspecified, with unspecified complications: Secondary | ICD-10-CM

## 2021-08-17 DIAGNOSIS — K529 Noninfective gastroenteritis and colitis, unspecified: Secondary | ICD-10-CM

## 2021-08-17 DIAGNOSIS — Z79899 Other long term (current) drug therapy: Secondary | ICD-10-CM

## 2021-08-17 DIAGNOSIS — E039 Hypothyroidism, unspecified: Secondary | ICD-10-CM

## 2021-08-18 ENCOUNTER — Other Ambulatory Visit: Payer: Self-pay

## 2021-08-18 ENCOUNTER — Other Ambulatory Visit: Payer: Self-pay | Admitting: Internal Medicine

## 2021-08-18 ENCOUNTER — Other Ambulatory Visit: Payer: Medicare Other

## 2021-08-18 DIAGNOSIS — K529 Noninfective gastroenteritis and colitis, unspecified: Secondary | ICD-10-CM

## 2021-08-18 DIAGNOSIS — E039 Hypothyroidism, unspecified: Secondary | ICD-10-CM | POA: Diagnosis not present

## 2021-08-18 DIAGNOSIS — K50919 Crohn's disease, unspecified, with unspecified complications: Secondary | ICD-10-CM | POA: Diagnosis not present

## 2021-08-18 DIAGNOSIS — Z79899 Other long term (current) drug therapy: Secondary | ICD-10-CM | POA: Diagnosis not present

## 2021-08-18 NOTE — Progress Notes (Addendum)
MEDICARE ANNUAL WELLNESS VISIT AND FOLLOW UP  Assessment:   Debbie Houston was seen today for follow-up and medicare wellness.  Diagnoses and all orders for this visit:  Encounter for Medicare annual wellness exam Yearly  Essential hypertension - continue medications, DASH diet, exercise and monitor at home. Call if greater than 130/80.   Uncomplicated asthma, unspecified asthma severity, unspecified whether persistent Doing well on current regimen, continue with benefit Continue to monitor  Colitis Having worsening episodes of diarrhea Continue to follow with Dr. Laverta Baltimore  Senile Purpura Has followed with Dr Estanislado Pandy  GERD Continue PPI/H2 blocker, diet discussed  Hypothyroidism, unspecified type Last TSH elevated Will change dosage to 187mg 1 tab T-TH-Sat and 1/2 tab the other days  Osteoarthritis, unspecified osteoarthritis type, unspecified site Doing well, tylenol PRN, Dr. CFrench Anafollows  Osteoporosis with delayed healing of right intertrochanteric fracture of femur Continue Calcium & Vit D Dr. BLayne Bentonfollowing for prolia, she is to be starting a new injection for bone growth but had to wait until impetigo infection resolved. 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 HDoreatha MartinCurrently using bone stimulator  Complete Right Bundle Branch Block Monitor  Spondylolisthesis at L4-L5 level S/P lumbar fusion.  Doing well Following with Dr EEllene Route Mixed hyperlipidemia Continue current regimen Discussed dietary and exercise modifications   Vitamin D deficiency Continue Vit D supplementation  Complete Right Bundle Branch Block Monitor symptoms  Vertigo Monitor; meclizine PRN if needed  TMJ pain Avoid gum, hard foods, heat may help, follow up with dentist; did see ENT  Impetigo Can stop keflex- continue Mupirocin and monitor symptoms  Over 40 minutes  of exam, counseling, chart review and critical decision making was performed Future Appointments  Date Time Provider DDonegal 08/28/2021  2:30 PM SRosemarie Ax MD [facility] 01/15/2022 10:30 AM MUnk Pinto MD [facility] None  02/08/2022 10:20 AM DOfilia Neas PA-C [facility] None  05/15/2022  3:00 PM MUnk Pinto MD [facility] None  08/21/2022 10:00 AM MMagda Bernheim NP [facility] None     Plan:   During the course of the visit the patient was educated and counseled about appropriate screening and preventive services including:   Pneumococcal vaccine  Prevnar 13 Influenza vaccine Td vaccine Screening electrocardiogram Bone densitometry screening Colorectal cancer screening Diabetes screening Glaucoma screening Nutrition counseling  Advanced directives: requested   Subjective:  Debbie Houston a 80y.o. female who presents for Medicare Annual Wellness Visit and 3 month follow up.   She had fall with R hip/fremur fracture in June 2021 on vacation at the beach, had IM nail placed. She has displaced intertrochanteric fracture of right femur,  closed fracture with nonunion The bone did not heal correctly and is currently undergoing treatments with bone stimulator and following with Dr. HDoreatha Martin bone stimulator currently, may require an additional surgery in the future  She had a DEXA 12/26/20 that showed left femoral neck with T -2.5. Following with Dr. BLayne Bentonon prolia, and is to begin a shot for bone growthbut was delayed due to impetigo infection  Pt was taking Keflex for Impetigo. Started having more nausea with Keflex and hasn't been eating much. Impetigo has improved and almost completely resolved.  Pt had abnormal SPEP, was seen by Dr. FBurr Medico no signs of multiple myeloma. Continues to follow.  [facility] and [facility] out Crohns.  Continues to have episodes of diarrhea   Continues to Have neck pain that radiates into  head. Tylenol will help some  Following with Dr. Davy Pique.  She has had vertigo, TMJ pain, some mildly asymmetric hearing, saw ENT and now has hearing aids.   BMI is Body mass index is 18.71 kg/m., she has been working on diet and exercise. Wt Readings from Last 3 Encounters:  08/21/21 109 lb (49.4 kg)  08/15/21 107 lb 12.8 oz (48.9 kg)  08/10/21 110 lb 9.6 oz (50.2 kg)    Her blood pressure has been controlled at home, today their BP is BP: 122/68 She does workout. She denies chest pain, shortness of breath, dizziness.   She is on cholesterol medication (simvastatin 20 mg, cholestipol 2 g daily) and denies myalgias. Her cholesterol is at goal. The cholesterol last visit was:   Lab Results  Component Value Date   CHOL 158 05/15/2021   HDL 63 05/15/2021   LDLCALC 76 05/15/2021   TRIG 108 05/15/2021   CHOLHDL 2.5 05/15/2021   . She has been working on diet and exercise. and denies Last A1C in the office was:  Lab Results  Component Value Date   HGBA1C 5.5 05/15/2021   Last GFR: Lab Results  Component Value Date   GFRNONAA >60 08/06/2021   She is on thyroid medication. Currently taking Levothyroxine 125 mcg 1 tab Tues and Thurs and 1/2 tab the other days Lab Results  Component Value Date   TSH 5.97 (H) 08/18/2021   Patient is on Vitamin D supplement.   Lab Results  Component Value Date   VD25OH 47 11/23/2020      Medication Review: Current Outpatient Medications on File Prior to Visit  Medication Sig Dispense Refill   acetaminophen (TYLENOL) 500 MG tablet Take 500 mg by mouth at bedtime as needed for moderate pain or headache.      Ascorbic Acid (VITAMIN C) 1000 MG tablet Take 1,000 mg by mouth daily.      aspirin EC 81 MG tablet Take 81 mg by mouth daily.      bisoprolol (ZEBETA) 5 MG tablet TAKE 1/2 TABLET BY MOUTH EVERY DAY IN THE EVENING FOR BLOOD PRESSURE 45 tablet 3   Calcium Carbonate-Vitamin D 600-400 MG-UNIT tablet Take 1 tablet by mouth daily.     cephALEXin  (KEFLEX) 500 MG capsule Take 1 capsule 4 x /day with Meals & Bedtime for Infection 40 capsule 0   Cholecalciferol 50 MCG (2000 UT) TABS Take by mouth.     Cinnamon 500 MG TABS Take 500 mg by mouth daily.      Coenzyme Q10 (COQ10) 200 MG CAPS Take 200 mg by mouth daily.     colestipol (COLESTID) 1 g tablet      dicyclomine (BENTYL) 20 MG tablet Take   1 tablet     3 x /day     before Meals 90 tablet 0   levothyroxine (SYNTHROID) 125 MCG tablet TAKE 1 TABLET BY MOUTH DAILY ON AN EMPTY STOMACH WITH ONLY WATER FOR 30 MINUTES AND NO ANTACID MEDS, CALCIUM, OR MAGNESIUM FOR 4 HOURS 90 tablet 0   loratadine (CLARITIN) 10 MG tablet Take by mouth.     Multiple Vitamin (THERA VITAMIN PO) Take 1 tablet by mouth daily.     Omega-3 Fatty Acids (FISH OIL) 1200 MG CAPS Take 1,200 mg by mouth daily.      pantoprazole (PROTONIX) 40 MG tablet Take 1 tablet  2 x /day  for Heartburn & Indigestion 180 tablet 0   Polyethyl Glycol-Propyl Glycol 0.4-0.3 % SOLN Place 1 drop into both eyes at bedtime.     Probiotic Product (PROBIOTIC PO) Take 1 capsule by mouth daily.     PROLIA 60 MG/ML SOSY injection Inject 60 mg into the skin every 6 (six) months.     simvastatin (ZOCOR) 20 MG tablet TAKE 1 TABLET BY MOUTH AT BEDTIME FOR CHOLESTEROL 90 tablet 1   Vedolizumab (ENTYVIO IV) Inject into the vein.     dexamethasone (DECADRON) 4 MG tablet Take 1 tab 3 x day - 3 days, then 2 x day - 3 days, then 1 tab daily (Patient not taking: Reported on 08/21/2021) 20 tablet 0   tiZANidine (ZANAFLEX) 4 MG tablet Take  1/2 to 1 tablet  3 x /day  as needed for Muscle Spasm & Pain (Patient not taking: Reported on 08/21/2021) 90 tablet 0   No current facility-administered medications on file prior to visit.    Allergies  Allergen Reactions   Fosamax [Alendronate Sodium] Other (See Comments)    GI upset    Singulair [Montelukast Sodium] Other (See Comments)    Makes pt jittery   Wellbutrin [Bupropion] Hives   Sulfa Antibiotics Rash     Current Problems (verified) Patient Active Problem List   Diagnosis Date Noted   History of ulcerative colitis 07/10/2021   Complete right bundle branch block (RBBB) 05/15/2021   Body mass index (BMI) of 19.0-19.9 in adult 05/12/2021   Bilateral nephrolithiasis 09/21/2020   Senile purpura ([place]) 08/17/2020   Bilateral temporomandibular joint pain 06/15/2020   Vertigo 03/25/2019   Spondylolisthesis at L4-L5 level 11/25/2018   Colitis 12/21/2014   Medication management 04/20/2014   Essential hypertension    Hyperlipidemia, mixed    Hypothyroid    GERD    Asthma    DJD (degenerative joint disease)    Vitamin D deficiency    Osteoporosis    Allergy     Screening Tests Immunization History  Administered Date(s) Administered   Influenza, High Dose Seasonal PF 07/23/2014, 08/23/2015, 07/10/2016, 08/27/2017, 08/22/2018, 08/25/2019, 08/30/2020   PFIZER(Purple Top)SARS-COV-2 Vaccination 12/10/2019, 12/31/2019, 08/16/2020, 04/14/2021   Pneumococcal Conjugate-13 05/05/2015   Pneumococcal-Unspecified 08/22/2011   Tdap 08/22/2011   Zoster Recombinat (Shingrix) 09/13/2017, 01/01/2018   Zoster, Live 06/20/2006    Preventative Care: Last colonoscopy: 04/2019 repeat due 2025 Upper GI:04/2019 Last mammogram: 01/05/21 neg, Hx Breast Cx. Last pap smear/pelvic exam: 2012 DEXA: 12/26/20 fem T-2.5, stable, planned in 2022 by Dr. Layne Benton  Prior vaccinations: TD or Tdap: 2012, DUE 2022  Influenza: 08/21/2021 Pneumococcal: 2012  Prevnar13: 2016 Shingrix: 2/2, 2019 Covid 19: 3/3, 2021, pfizer   Names of Other Physician/Practitioners you currently use: 1. [facility] here for primary care 2.Eye Exam has appt 09/06/2021 3. Dentist 2021, 08/31/2021   Patient Care Team: Unk Pinto, MD as PCP - General (Internal Medicine) Jerrell Belfast, MD as Consulting Physician (Otolaryngology) Kristeen Miss, MD as Consulting Physician (Neurosurgery) Clarene Essex, MD as Consulting Physician (Gastroenterology) Mammography, [facility] (Diagnostic Radiology) Truitt Merle, MD as Consulting Physician (Hematology) Bo Merino, MD as Consulting Physician (Rheumatology)  SURGICAL HISTORY She  has a past surgical history that includes Mohs surgery (2005); Tonsillectomy (age 86); Dilation and curettage of uterus; Dilatation & curettage/hysteroscopy with myosure (N/A, 10/27/2014); Back surgery (2020); Hip surgery (Right, 04/2020); and Cataract extraction, bilateral (Bilateral, 2020). FAMILY HISTORY Her family history includes Autism in her son; Autism spectrum disorder in her son;  Cancer in her father, maternal grandmother, mother, and sister; Colitis in her son; Colon cancer (age of onset: 26) in her brother; Diabetes in her brother and maternal grandfather; Healthy in her son; Hyperlipidemia in her brother, father, maternal grandfather, maternal grandmother, mother, paternal grandfather, paternal grandmother, and sister; Hypertension in her brother and mother; Stroke in her maternal grandfather. SOCIAL HISTORY She  reports that she quit smoking about 20 years ago. Her smoking use included cigarettes. She has never used smokeless tobacco. She reports current alcohol use. She reports that she does not use drugs.   MEDICARE WELLNESS OBJECTIVES: Physical activity: Current Exercise Habits: Structured exercise class, Type of exercise: walking, Time (Minutes): 20, Frequency (Times/Week): 3, Weekly Exercise (Minutes/Week): 60, Intensity: Mild, Exercise limited by: orthopedic condition(s) Cardiac risk factors: Cardiac Risk Factors include: dyslipidemia;hypertension;sedentary lifestyle;advanced age (>85mn, >>35women) Depression/mood screen:   Depression screen [facility]/9 08/21/2021  Decreased Interest 1  Down, Depressed, Hopeless 1  PHQ - 2 Score 2  Altered sleeping 0  Change in appetite 2  Trouble concentrating 1  Moving slowly or fidgety/restless 1  Suicidal thoughts 0   PHQ-9 Score 6  Difficult doing work/chores Somewhat difficult    ADLs:  In your present state of health, do you have any difficulty performing the following activities: 08/21/2021  Hearing? Y  Vision? N  Difficulty concentrating or making decisions? N  Walking or climbing stairs? N  Dressing or bathing? N  Doing errands, shopping? N  Some recent data might be hidden      Cognitive Testing  Alert? Yes  Normal Appearance?Yes  Oriented to person? Yes  Place? Yes   Time? Yes  Recall of three objects?  Yes  Can perform simple calculations? Yes  Displays appropriate judgment?Yes  Can read the correct time from a watch face?Yes  EOL planning: Does Patient Have a Medical Advance Directive?: Yes Type of Advance Directive: Healthcare Power of Attorney, Living will Does patient want to make changes to medical advance directive?: No - Patient declined Copy of HBlissin Chart?: No - copy requested  Review of Systems  Constitutional:  Positive for malaise/fatigue and weight loss. Negative for chills, diaphoresis and fever.  HENT:  Negative for congestion, ear discharge, ear pain, nosebleeds, sinus pain, sore throat and tinnitus.   Eyes:  Negative for blurred vision, double vision, photophobia, pain, discharge and redness.  Respiratory:  Negative for cough, hemoptysis, sputum production, shortness of breath, wheezing and stridor.   Cardiovascular:  Negative for chest pain, palpitations, orthopnea, claudication, leg swelling and PND.  Gastrointestinal:  Positive for diarrhea and nausea (on Keflex). Negative for abdominal pain, blood in stool, constipation, heartburn, melena and vomiting.  Genitourinary:  Negative for dysuria, flank pain, frequency, hematuria and urgency.  Musculoskeletal:  Positive for back pain, joint pain (right hip) and neck pain. Negative for falls and myalgias.       S/P lumbar fusion.  Skin:  Positive for rash. Negative for itching.        Continued senile purpura rash on right shin  Neurological:  Positive for weakness. Negative for dizziness, tingling, tremors, sensory change, speech change, focal weakness, seizures, loss of consciousness and headaches.  Endo/Heme/Allergies:  Negative for environmental allergies and polydipsia. Bruises/bleeds easily.  Psychiatric/Behavioral:  Positive for depression. Negative for hallucinations, memory loss, substance abuse and suicidal ideas. The patient is not nervous/anxious and does not have insomnia.     Objective:     Today's Vitals   08/21/21 0935  BP: 122/68  Pulse: 84  Temp: 97.7 F (36.5 C)  SpO2: 99%  Weight: 109 lb (49.4 kg)   Body mass index is 18.71 kg/m.  General appearance: alert, no distress, WD/WN, female HEENT: normocephalic, sclerae anicteric, TMs pearly, nares patent, no discharge or erythema, pharynx normal Oral cavity: MMM, no lesions Neck: supple, no lymphadenopathy, no thyromegaly, no masses Heart: RRR, normal S1, S2, no murmurs Lungs: CTA bilaterally, no wheezes, rhonchi, or rales Abdomen: +bs, soft, non tender, non distended, no masses, no hepatomegaly, no splenomegaly Musculoskeletal: nontender, no swelling, no obvious deformity Extremities: no edema, no cyanosis, no clubbing Pulses: 2+ symmetric, upper and lower extremities, normal cap refill Neurological: alert, oriented x 3, CN2-12 intact, strength normal upper extremities and lower extremities, sensation normal throughout, DTRs 2+ throughout, no cerebellar signs, gait slow steady with cane  Psychiatric: normal affect, behavior normal, pleasant  Derm: no rashes, concerning lesions; has scattered small ecchymosis to bil upper extremities  Medicare Attestation I have personally reviewed: The patient's medical and social history Their use of alcohol, tobacco or illicit drugs Their current medications and supplements The patient's functional ability including ADLs,fall risks, home safety risks,  cognitive, and hearing and visual impairment Diet and physical activities Evidence for depression or mood disorders  The patient's weight, height, BMI, and visual acuity have been recorded in the chart.  I have made referrals, counseling, and provided education to the patient based on review of the above and I have provided the patient with a written personalized care plan for preventive services.     Magda Bernheim, NP   08/21/2021

## 2021-08-20 ENCOUNTER — Telehealth: Payer: Self-pay | Admitting: Hematology

## 2021-08-20 NOTE — Telephone Encounter (Signed)
I called pt today to review her lab from 07/27/21 and urine test from 08/01/21. Both were negative for M-protein and light chain levels were normal. 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 No evidence of multiple myeloma. She has not been feeling well lately and was found to be mildly anemia 2 days ago. I suggest her to take some prenatal MVI, and call me if her anemia does not resolve in the next few months. I otherwise do not plan to see her back.   Truitt Merle  08/20/2021

## 2021-08-21 ENCOUNTER — Encounter: Payer: Self-pay | Admitting: Nurse Practitioner

## 2021-08-21 ENCOUNTER — Other Ambulatory Visit: Payer: Self-pay

## 2021-08-21 ENCOUNTER — Ambulatory Visit (INDEPENDENT_AMBULATORY_CARE_PROVIDER_SITE_OTHER): Payer: Medicare Other | Admitting: Nurse Practitioner

## 2021-08-21 VITALS — BP 122/68 | HR 84 | Temp 97.7°F | Wt 109.0 lb

## 2021-08-21 DIAGNOSIS — M159 Polyosteoarthritis, unspecified: Secondary | ICD-10-CM

## 2021-08-21 DIAGNOSIS — J45909 Unspecified asthma, uncomplicated: Secondary | ICD-10-CM

## 2021-08-21 DIAGNOSIS — E782 Mixed hyperlipidemia: Secondary | ICD-10-CM

## 2021-08-21 DIAGNOSIS — M1990 Unspecified osteoarthritis, unspecified site: Secondary | ICD-10-CM | POA: Diagnosis not present

## 2021-08-21 DIAGNOSIS — K50919 Crohn's disease, unspecified, with unspecified complications: Secondary | ICD-10-CM | POA: Diagnosis not present

## 2021-08-21 DIAGNOSIS — M8000XG Age-related osteoporosis with current pathological fracture, unspecified site, subsequent encounter for fracture with delayed healing: Secondary | ICD-10-CM

## 2021-08-21 DIAGNOSIS — K529 Noninfective gastroenteritis and colitis, unspecified: Secondary | ICD-10-CM

## 2021-08-21 DIAGNOSIS — K2100 Gastro-esophageal reflux disease with esophagitis, without bleeding: Secondary | ICD-10-CM | POA: Diagnosis not present

## 2021-08-21 DIAGNOSIS — Z0000 Encounter for general adult medical examination without abnormal findings: Secondary | ICD-10-CM

## 2021-08-21 DIAGNOSIS — I10 Essential (primary) hypertension: Secondary | ICD-10-CM | POA: Diagnosis not present

## 2021-08-21 DIAGNOSIS — D692 Other nonthrombocytopenic purpura: Secondary | ICD-10-CM

## 2021-08-21 DIAGNOSIS — I776 Arteritis, unspecified: Secondary | ICD-10-CM

## 2021-08-21 DIAGNOSIS — I4510 Unspecified right bundle-branch block: Secondary | ICD-10-CM

## 2021-08-21 DIAGNOSIS — E559 Vitamin D deficiency, unspecified: Secondary | ICD-10-CM | POA: Diagnosis not present

## 2021-08-21 DIAGNOSIS — Z23 Encounter for immunization: Secondary | ICD-10-CM

## 2021-08-21 DIAGNOSIS — E039 Hypothyroidism, unspecified: Secondary | ICD-10-CM | POA: Diagnosis not present

## 2021-08-21 DIAGNOSIS — Z79899 Other long term (current) drug therapy: Secondary | ICD-10-CM | POA: Diagnosis not present

## 2021-08-21 LAB — COMPLETE METABOLIC PANEL WITH GFR
AG Ratio: 2 (calc) (ref 1.0–2.5)
ALT: 13 U/L (ref 6–29)
AST: 17 U/L (ref 10–35)
Albumin: 3.6 g/dL (ref 3.6–5.1)
Alkaline phosphatase (APISO): 44 U/L (ref 37–153)
BUN: 10 mg/dL (ref 7–25)
CO2: 24 mmol/L (ref 20–32)
Calcium: 8.6 mg/dL (ref 8.6–10.4)
Chloride: 103 mmol/L (ref 98–110)
Creat: 0.75 mg/dL (ref 0.60–0.95)
Globulin: 1.8 g/dL (calc) — ABNORMAL LOW (ref 1.9–3.7)
Glucose, Bld: 79 mg/dL (ref 65–99)
Potassium: 4.2 mmol/L (ref 3.5–5.3)
Sodium: 135 mmol/L (ref 135–146)
Total Bilirubin: 0.3 mg/dL (ref 0.2–1.2)
Total Protein: 5.4 g/dL — ABNORMAL LOW (ref 6.1–8.1)
eGFR: 80 mL/min/{1.73_m2} (ref >=60)

## 2021-08-21 LAB — CBC WITH DIFFERENTIAL/PLATELET
Absolute Monocytes: 493 cells/uL (ref 200–950)
Basophils Absolute: 22 cells/uL (ref 0–200)
Basophils Relative: 0.4 %
Eosinophils Absolute: 101 cells/uL (ref 15–500)
Eosinophils Relative: 1.8 %
HCT: 31.1 % — ABNORMAL LOW (ref 35.0–45.0)
Hemoglobin: 10.2 g/dL — ABNORMAL LOW (ref 11.7–15.5)
Lymphs Abs: 762 cells/uL — ABNORMAL LOW (ref 850–3900)
MCH: 30.8 pg (ref 27.0–33.0)
MCHC: 32.8 g/dL (ref 32.0–36.0)
MCV: 94 fL (ref 80.0–100.0)
MPV: 10.1 fL (ref 7.5–12.5)
Monocytes Relative: 8.8 %
Neutro Abs: 4222 cells/uL (ref 1500–7800)
Neutrophils Relative %: 75.4 %
Platelets: 261 10*3/uL (ref 140–400)
RBC: 3.31 10*6/uL — ABNORMAL LOW (ref 3.80–5.10)
RDW: 13.6 % (ref 11.0–15.0)
Total Lymphocyte: 13.6 %
WBC: 5.6 10*3/uL (ref 3.8–10.8)

## 2021-08-21 LAB — TSH: TSH: 5.97 mIU/L — ABNORMAL HIGH (ref 0.40–4.50)

## 2021-08-21 LAB — CORTISOL: Cortisol, Plasma: 18.8 ug/dL

## 2021-08-21 LAB — C-REACTIVE PROTEIN: CRP: 9.1 mg/L — ABNORMAL HIGH (ref <8.0)

## 2021-08-21 NOTE — Patient Instructions (Signed)
Please take 1 levothyroxine tablet Tuesday-Thursday- Saturday and 1/2 tab Mon, Wed, Fri, [place]   Know what a healthy weight is for you (roughly BMI <25) and aim to maintain this   Aim for 7+ servings of fruits and vegetables daily   70-80+ fluid ounces of water or unsweet tea for healthy kidneys   Limit to max 1 drink of alcohol per day; avoid smoking/tobacco   Limit animal fats in diet for cholesterol and heart health - choose grass fed whenever available   Avoid highly processed foods, and foods high in saturated/trans fats   Aim for low stress - take time to unwind and care for your mental health   Aim for 150 min of moderate intensity exercise weekly for heart health, and weights twice weekly for bone health   Aim for 7-9 hours of sleep daily

## 2021-08-23 ENCOUNTER — Other Ambulatory Visit: Payer: Self-pay | Admitting: Sports Medicine

## 2021-08-23 DIAGNOSIS — M25552 Pain in left hip: Secondary | ICD-10-CM | POA: Diagnosis not present

## 2021-08-23 DIAGNOSIS — M5416 Radiculopathy, lumbar region: Secondary | ICD-10-CM | POA: Diagnosis not present

## 2021-08-23 DIAGNOSIS — M810 Age-related osteoporosis without current pathological fracture: Secondary | ICD-10-CM | POA: Diagnosis not present

## 2021-08-25 DIAGNOSIS — M25552 Pain in left hip: Secondary | ICD-10-CM | POA: Diagnosis not present

## 2021-08-25 DIAGNOSIS — M545 Low back pain, unspecified: Secondary | ICD-10-CM | POA: Diagnosis not present

## 2021-08-25 LAB — C. DIFFICILE GDH AND TOXIN A/B
GDH ANTIGEN: NOT DETECTED
MICRO NUMBER:: 12453005
SPECIMEN QUALITY:: ADEQUATE
TOXIN A AND B: NOT DETECTED

## 2021-08-25 LAB — CALPROTECTIN: Calprotectin: 135 mcg/g — ABNORMAL HIGH

## 2021-08-28 ENCOUNTER — Ambulatory Visit (INDEPENDENT_AMBULATORY_CARE_PROVIDER_SITE_OTHER): Payer: Medicare Other | Admitting: Family Medicine

## 2021-08-28 ENCOUNTER — Encounter: Payer: Self-pay | Admitting: Family Medicine

## 2021-08-28 VITALS — Ht 64.0 in | Wt 109.0 lb

## 2021-08-28 DIAGNOSIS — M8000XA Age-related osteoporosis with current pathological fracture, unspecified site, initial encounter for fracture: Secondary | ICD-10-CM

## 2021-08-28 NOTE — Patient Instructions (Signed)
Nice to meet you  Please send me a message in MyChart with any questions or updates.  We'll keep you updated with updates on the medicine.   --Dr. Raeford Razor

## 2021-08-28 NOTE — Progress Notes (Signed)
Debbie Houston - 80 y.o. female MRN [id number]  Date of birth: [birth year]  SUBJECTIVE:  Including CC & ROS.  No chief complaint on file.   Debbie Houston is a 80 y.o. female that is presenting with a nonunion of the right femur fracture.  She has a long history of osteoporosis and treated with Prolia.  In the summer 2020 she had surgery with an intramedullary rod.  Has been dealing with nonunion since that time.  Has been using a bone stimulator.   Review of Systems See HPI   HISTORY: Past Medical, Surgical, Social, and Family History Reviewed & Updated per EMR.   Pertinent Historical Findings include:  Past Medical History:  Diagnosis Date   Arthritis    Benign labile hypertension    Cancer ([place])    skin cancer on face   Colitis    DJD (degenerative joint disease)    Endometrial polyp    GERD (gastroesophageal reflux disease)    History of basal cell carcinoma excision    History of esophagitis    HOH (hard of hearing)    right ear better than left  -- wears no aides   Hyperlipidemia    Hypothyroidism    Osteopenia    Prediabetes    S/P right hip fracture 08/17/2020   Vitamin D deficiency    Wears glasses     Past Surgical History:  Procedure Laterality Date   BACK SURGERY  2020   CATARACT EXTRACTION, BILATERAL Bilateral 2020   DILATATION & CURETTAGE/HYSTEROSCOPY WITH MYOSURE N/A 10/27/2014   Procedure: DILATATION & CURETTAGE/HYSTEROSCOPY WITH MYOSURE;  Surgeon: Darlyn Chamber, MD;  Location: [place];  Service: Gynecology;  Laterality: N/A;   DILATION AND CURETTAGE OF UTERUS     HIP SURGERY Right 04/2020   rod and screw in right hip    MOHS SURGERY  2005   LEFT NASAL BRIDGE FOR BASAL CELL   TONSILLECTOMY  age 39    Family History  Problem Relation Age of Onset   Hypertension Mother    Cancer Mother        COLON   Hyperlipidemia Mother    Cancer Father        BREAST WITH BRAIN METS   Hyperlipidemia Father    Cancer Sister        COLON  (TEENS), BREAST (63)   Hyperlipidemia Sister    Diabetes Brother    Hyperlipidemia Brother    Hypertension Brother    Colon cancer Brother 55   Cancer Maternal Grandmother        RENAL   Hyperlipidemia Maternal Grandmother    Diabetes Maternal Grandfather    Stroke Maternal Grandfather    Hyperlipidemia Maternal Grandfather    Hyperlipidemia Paternal Grandmother    Hyperlipidemia Paternal Grandfather    Autism Son    Colitis Son    Autism spectrum disorder Son    Healthy Son     Social History   Socioeconomic History   Marital status: Married    Spouse name: Not on file   Number of children: Not on file   Years of education: Not on file   Highest education level: Not on file  Occupational History   Not on file  Tobacco Use   Smoking status: Former    Years: 15.00    Types: Cigarettes    Quit date: 2002    Years since quitting: 20.7   Smokeless tobacco: Never   Tobacco comments:  Former light smoker, quit 15+ years ago, can't recall  Vaping Use   Vaping Use: Never used  Substance and Sexual Activity   Alcohol use: Yes    Comment: Rarely   Drug use: No   Sexual activity: Yes    Partners: Male    Birth control/protection: Post-menopausal  Other Topics Concern   Not on file  Social History Narrative   Not on file   Social Determinants of Health   Financial Resource Strain: Not on file  Food Insecurity: Not on file  Transportation Needs: Not on file  Physical Activity: Not on file  Stress: Not on file  Social Connections: Not on file  Intimate Partner Violence: Not At Risk   Fear of Current or Ex-Partner: No   Emotionally Abused: No   Physically Abused: No   Sexually Abused: No     PHYSICAL EXAM:  VS: Ht 5' 4"  (1.626 m)   Wt 109 lb (49.4 kg)   BMI 18.71 kg/m  Physical Exam Gen: NAD, alert, cooperative with exam, well-appearing      ASSESSMENT & PLAN:   Osteoporosis with current pathological fracture She has been dealing with a nonunion  right intertrochanteric fracture with multiple fracture fragments with most recent DEXA scan was showing osteoporosis.  Has been treated with Prolia during this time.  Currently having a bone stimulator to try to heal the nonunion which has been refractory to treatment thus far. -Counseled on home exercise therapy and supportive care. -Pursue Evenity.

## 2021-08-28 NOTE — Assessment & Plan Note (Signed)
She has been dealing with a nonunion right intertrochanteric fracture with multiple fracture fragments with most recent DEXA scan was showing osteoporosis.  Has been treated with Prolia during this time.  Currently having a bone stimulator to try to heal the nonunion which has been refractory to treatment thus far. -Counseled on home exercise therapy and supportive care. -Pursue Evenity.

## 2021-08-29 NOTE — Progress Notes (Signed)
============================================================ -   Test results slightly outside the reference range are not unusual. If there is anything important, I will review this with you,  otherwise it is considered normal test values.  If you have further questions,  please do not hesitate to contact me at the office or via My Chart.  ============================================================ ============================================================  -  Calprotectin test for inflammatory Colitis is OK - essentially Normal  &   - C. Difficile test is  also Negative ============================================================ ============================================================

## 2021-08-30 ENCOUNTER — Other Ambulatory Visit: Payer: Medicare Other

## 2021-09-01 DIAGNOSIS — K51919 Ulcerative colitis, unspecified with unspecified complications: Secondary | ICD-10-CM | POA: Diagnosis not present

## 2021-09-05 ENCOUNTER — Other Ambulatory Visit: Payer: Self-pay | Admitting: Nurse Practitioner

## 2021-09-06 DIAGNOSIS — Z9849 Cataract extraction status, unspecified eye: Secondary | ICD-10-CM | POA: Diagnosis not present

## 2021-09-06 DIAGNOSIS — H16223 Keratoconjunctivitis sicca, not specified as Sjogren's, bilateral: Secondary | ICD-10-CM | POA: Diagnosis not present

## 2021-09-06 DIAGNOSIS — H04123 Dry eye syndrome of bilateral lacrimal glands: Secondary | ICD-10-CM | POA: Diagnosis not present

## 2021-09-06 DIAGNOSIS — H52223 Regular astigmatism, bilateral: Secondary | ICD-10-CM | POA: Diagnosis not present

## 2021-09-06 DIAGNOSIS — Z961 Presence of intraocular lens: Secondary | ICD-10-CM | POA: Diagnosis not present

## 2021-09-06 DIAGNOSIS — H35033 Hypertensive retinopathy, bilateral: Secondary | ICD-10-CM | POA: Diagnosis not present

## 2021-09-06 DIAGNOSIS — H5203 Hypermetropia, bilateral: Secondary | ICD-10-CM | POA: Diagnosis not present

## 2021-09-06 DIAGNOSIS — H524 Presbyopia: Secondary | ICD-10-CM | POA: Diagnosis not present

## 2021-09-06 DIAGNOSIS — I10 Essential (primary) hypertension: Secondary | ICD-10-CM | POA: Diagnosis not present

## 2021-09-12 DIAGNOSIS — M25552 Pain in left hip: Secondary | ICD-10-CM | POA: Diagnosis not present

## 2021-09-13 DIAGNOSIS — Z23 Encounter for immunization: Secondary | ICD-10-CM | POA: Diagnosis not present

## 2021-09-14 ENCOUNTER — Other Ambulatory Visit: Payer: Self-pay | Admitting: Nurse Practitioner

## 2021-09-14 DIAGNOSIS — K52839 Microscopic colitis, unspecified: Principal | ICD-10-CM

## 2021-09-14 DIAGNOSIS — M47812 Spondylosis without myelopathy or radiculopathy, cervical region: Secondary | ICD-10-CM | POA: Diagnosis not present

## 2021-09-14 DIAGNOSIS — E039 Hypothyroidism, unspecified: Secondary | ICD-10-CM

## 2021-09-14 MED ORDER — COLESTIPOL 1 GRAM TABLET
ORAL_TABLET | Freq: Two times a day (BID) | ORAL | 11 refills | 30 days | Status: CP
Start: 2021-09-14 — End: 2022-09-14

## 2021-09-15 DIAGNOSIS — K50919 Crohn's disease, unspecified, with unspecified complications: Secondary | ICD-10-CM | POA: Diagnosis not present

## 2021-09-15 DIAGNOSIS — R197 Diarrhea, unspecified: Secondary | ICD-10-CM | POA: Diagnosis not present

## 2021-09-17 DIAGNOSIS — N390 Urinary tract infection, site not specified: Secondary | ICD-10-CM | POA: Diagnosis not present

## 2021-09-17 DIAGNOSIS — R300 Dysuria: Secondary | ICD-10-CM | POA: Diagnosis not present

## 2021-09-17 DIAGNOSIS — N750 Cyst of Bartholin's gland: Secondary | ICD-10-CM | POA: Diagnosis not present

## 2021-09-20 ENCOUNTER — Telehealth: Payer: Self-pay

## 2021-09-20 ENCOUNTER — Encounter: Payer: Self-pay | Admitting: Adult Health

## 2021-09-20 ENCOUNTER — Ambulatory Visit (INDEPENDENT_AMBULATORY_CARE_PROVIDER_SITE_OTHER): Payer: Medicare Other | Admitting: Adult Health

## 2021-09-20 ENCOUNTER — Other Ambulatory Visit: Payer: Self-pay

## 2021-09-20 VITALS — BP 124/72 | HR 60 | Temp 97.5°F | Wt 108.0 lb

## 2021-09-20 DIAGNOSIS — N750 Cyst of Bartholin's gland: Secondary | ICD-10-CM | POA: Diagnosis not present

## 2021-09-20 NOTE — Progress Notes (Signed)
Assessment and Plan:  Amos was seen today for bartholin's cyst.  Diagnoses and all orders for this visit:  Infected cyst of Bartholin's gland duct Vs Skene's gland; difficult to visualize on exam today with swelling and tenderness; Not improving with abx; urgent referral to GYN ideally today or tomorrow for I&D; ED precautions given if getting much worse -     Ambulatory referral to Gynecology  Further disposition pending results of labs. Discussed med's effects and SE's.   Over 15 minutes of exam, counseling, chart review, and critical decision making was performed.   Future Appointments  Date Time Provider [place]  11/21/2021  9:30 AM Unk Pinto, MD [facility] None  02/08/2022 10:20 AM Ofilia Neas, PA-C [facility] None  05/15/2022  3:00 PM Unk Pinto, MD [facility] None  08/21/2022 10:00 AM Magda Bernheim, NP [facility] None    ------------------------------------------------------------------------------------------------------------------   HPI BP 124/72   Pulse 60   Temp (!) 97.5 F (36.4 C)   Wt 108 lb (49 kg)   SpO2 99%   BMI 18.54 kg/m  80 y.o.female presents for follow up on infected Bartholin's cyst.   She reports sx began abruptly 4 days ago, pain and swelling in left labia, presented to UC where she was diagnosed with bartholin's gland cyst infection. She was initiated on augmentin and doxycycline, has completed 6 doses thus far, no improvement, very tender, has been having some sweats/chills at night. She has been taking tylenol at night for pain with some improvement. Requesting referral to GYN.   Not recently sexually active. No hx of abnormal, aged out and hasn't seen GYN in several years.   Past Medical History:  Diagnosis Date   Arthritis    Benign labile hypertension    Cancer (HCC)    skin cancer on face   Colitis    DJD (degenerative joint disease)    Endometrial polyp    GERD (gastroesophageal reflux disease)    History of basal  cell carcinoma excision    History of esophagitis    HOH (hard of hearing)    right ear better than left  -- wears no aides   Hyperlipidemia    Hypothyroidism    Osteopenia    Prediabetes    S/P right hip fracture 08/17/2020   Vitamin D deficiency    Wears glasses      Allergies  Allergen Reactions   Fosamax [Alendronate Sodium] Other (See Comments)    GI upset    Singulair [Montelukast Sodium] Other (See Comments)    Makes pt jittery   Wellbutrin [Bupropion] Hives   Sulfa Antibiotics Rash    Current Outpatient Medications on File Prior to Visit  Medication Sig   acetaminophen (TYLENOL) 500 MG tablet Take 500 mg by mouth at bedtime as needed for moderate pain or headache.    Ascorbic Acid (VITAMIN C) 1000 MG tablet Take 1,000 mg by mouth daily.    aspirin EC 81 MG tablet Take 81 mg by mouth daily.    bisoprolol (ZEBETA) 5 MG tablet TAKE 1/2 TABLET BY MOUTH EVERY DAY IN THE EVENING FOR BLOOD PRESSURE   Calcium Carbonate-Vitamin D 600-400 MG-UNIT tablet Take 1 tablet by mouth daily.   Cholecalciferol 50 MCG (2000 UT) TABS Take by mouth.   Cinnamon 500 MG TABS Take 500 mg by mouth daily.    Coenzyme Q10 (COQ10) 200 MG CAPS Take 200 mg by mouth daily.   colestipol (COLESTID) 1 g tablet    dicyclomine (BENTYL) 20  MG tablet Take   1 tablet     3 x /day     before Meals   levothyroxine (SYNTHROID) 125 MCG tablet TAKE 1 TABLET BY MOUTH DAILY ON AN EMPTY STOMACH WITH ONLY WATER FOR 30 MINUTES NO ANTACID MEDS, CALCIUM OR MAGNESIUM FOR 4 HOURS   loratadine (CLARITIN) 10 MG tablet Take by mouth.   Multiple Vitamin (THERA VITAMIN PO) Take 1 tablet by mouth daily.   Omega-3 Fatty Acids (FISH OIL) 1200 MG CAPS Take 1,200 mg by mouth daily.    pantoprazole (PROTONIX) 40 MG tablet Take 1 tablet  2 x /day  for Heartburn & Indigestion   Polyethyl Glycol-Propyl Glycol 0.4-0.3 % SOLN Place 1 drop into both eyes at bedtime.   Probiotic Product (PROBIOTIC PO) Take 1 capsule by mouth daily.   PROLIA  60 MG/ML SOSY injection Inject 60 mg into the skin every 6 (six) months.   simvastatin (ZOCOR) 20 MG tablet TAKE 1 TABLET BY MOUTH AT BEDTIME FOR CHOLESTEROL   Vedolizumab (ENTYVIO IV) Inject into the vein.   cephALEXin (KEFLEX) 500 MG capsule Take 1 capsule 4 x /day with Meals & Bedtime for Infection   No current facility-administered medications on file prior to visit.    ROS: all negative except above.   Physical Exam:  BP 124/72   Pulse 60   Temp (!) 97.5 F (36.4 C)   Wt 108 lb (49 kg)   SpO2 99%   BMI 18.54 kg/m   General Appearance: Well nourished, in no apparent distress. Eyes: conjunctiva no swelling or erythema ENT/Mouth: mask in place; Hearing normal.  Neck: Supple Respiratory: Respiratory effort normal, BS equal bilaterally without rales, rhonchi, wheezing or stridor.  Cardio: RRR with no MRGs. Brisk peripheral pulses without edema.  Abdomen: Soft, + BS.  Non tender, no guarding, rebound, hernias, masses. Lymphatics: Non tender without lymphadenopathy.  Neuro: Normal muscle tone Psych: Awake and oriented X 3, normal affect, Insight and Judgment appropriate.  GU: left anterior labia swollen, erythematous and enlarged/firm extending up above pubis, tenderness limits detailed exam. No fluctuance, rash, ulcer visualized.     Izora Ribas, NP 11:51 AM [facility]

## 2021-09-20 NOTE — Telephone Encounter (Addendum)
Pt informed of below.  She will call us when she is ready to schedule a nurse visit to start Evenity.

## 2021-09-20 NOTE — Telephone Encounter (Signed)
Left message for patient to call back to inform of below and schedule nurse visit to start Evenity.

## 2021-09-20 NOTE — Telephone Encounter (Signed)
Pt ready for scheduling on or after 09/20/21  Out-of-pocket cost due at time of visit: $0.00  Primary: Medicare [place] co-insurance: 20% (approximately $255) Admin fee co-insurance: 20% (approximately $25)  Secondary: BCBS [place] co-insurance: Covers Medicare Part B co-insurance and deductible.  Admin fee co-insurance: Covers Medicare Part B co-insurance and deductible.   Deductible: $233 of $233 met  Prior Auth: not required PA# Valid:   ** This summary of benefits is an estimation of the patient's out-of-pocket cost. Exact cost may vary based on individual plan coverage.

## 2021-09-21 ENCOUNTER — Ambulatory Visit (INDEPENDENT_AMBULATORY_CARE_PROVIDER_SITE_OTHER): Payer: Medicare Other | Admitting: Obstetrics & Gynecology

## 2021-09-21 ENCOUNTER — Encounter: Payer: Self-pay | Admitting: Obstetrics & Gynecology

## 2021-09-21 VITALS — BP 106/64 | HR 82 | Resp 16 | Wt 106.0 lb

## 2021-09-21 DIAGNOSIS — N764 Abscess of vulva: Secondary | ICD-10-CM

## 2021-09-21 MED ORDER — AMOXICILLIN-POT CLAVULANATE 875-125 MG PO TABS: 1.0000 | ORAL_TABLET | Freq: Two times a day (BID) | ORAL | 0 refills | Status: DC

## 2021-09-21 NOTE — Progress Notes (Signed)
Debbie Houston 1941-09-24 [id number]   History:    80 y.o. [id number]  RP:  New patient presenting for Left painful swollen vulva x about 1 week  HPI: Seen at Urgent Care on Sunday 10/30th and started on Augmentin/Doxy.  Still swollen, red and painful.  Started draining a little bit of pus that she sees on her underwear x today.  Applying cold and warm compresses on it.  No fever.  Past medical history,surgical history, family history and social history were all reviewed and documented in the EPIC chart.  Gynecologic History No LMP recorded. Patient is postmenopausal.  Obstetric History OB History  Gravida Para Term Preterm AB Living  3 2 2  0 1 2  SAB IAB Ectopic Multiple Live Births  1 0 0 0      # Outcome Date GA Lbr Len/2nd Weight Sex Delivery Anes PTL Lv  3 SAB           2 Term           1 Term              ROS: A ROS was performed and pertinent positives and negatives are included in the history.  GENERAL: No fevers or chills. HEENT: No change in vision, no earache, sore throat or sinus congestion. NECK: No pain or stiffness. CARDIOVASCULAR: No chest pain or pressure. No palpitations. PULMONARY: No shortness of breath, cough or wheeze. GASTROINTESTINAL: No abdominal pain, nausea, vomiting or diarrhea, melena or bright red blood per rectum. GENITOURINARY: No urinary frequency, urgency, hesitancy or dysuria. MUSCULOSKELETAL: No joint or muscle pain, no back pain, no recent trauma. DERMATOLOGIC: No rash, no itching, no lesions. ENDOCRINE: No polyuria, polydipsia, no heat or cold intolerance. No recent change in weight. HEMATOLOGICAL: No anemia or easy bruising or bleeding. NEUROLOGIC: No headache, seizures, numbness, tingling or weakness. PSYCHIATRIC: No depression, no loss of interest in normal activity or change in sleep pattern.     Exam:   BP 106/64   Pulse 82   Resp 16   Wt 106 lb (48.1 kg)   BMI 18.19 kg/m   Body mass index is 18.19 kg/m.  General appearance :  Well developed well nourished female. No acute distress  Pelvic: Vulva:  Left vulva with erythema, induration, tenderness from a large abscess spreading from the left pubis to the posterior left vulva (Not in typical deep location towards the vagina like a Bartholin Gland abscess).  Mild drainage of pus at the posterior vulva.    Informed written consent obtained for drainage:  Betadine prep.  Local anesthesia with Lidocaine 1%.  Incision for drainage where it was already draining mildly.                Anus: Normal   Assessment/Plan:  80 y.o. female   1. Left Vulvar abscess Left vulva with erythema, induration, tenderness from a large abscess spreading from the left pubis to the posterior left vulva (Not in typical deep location towards the vagina like a Bartholin Gland abscess).  Mild drainage of pus at the posterior vulva. Culture of the pus done.  Patient already on Doxy/Augmentin.  Will await abscess culture and sensitivity to decide on changing ABTx or not. - Wound culture  Other orders - doxycycline (MONODOX) 100 MG capsule; Take 100 mg by mouth 2 (two) times daily. - amoxicillin-clavulanate (AUGMENTIN) 875-125 MG tablet; Take 1 tablet by mouth 2 (two) times daily. Prolongation of treatment for a total of 14 days.  Princess Bruins MD, 3:33 PM 09/21/2021

## 2021-09-24 LAB — WOUND CULTURE
MICRO NUMBER:: 12589941
SPECIMEN QUALITY:: ADEQUATE

## 2021-09-25 ENCOUNTER — Telehealth: Payer: Self-pay

## 2021-09-25 MED ORDER — CLINDAMYCIN HCL 300 MG PO CAPS: 300.0000 mg | ORAL_CAPSULE | Freq: Four times a day (QID) | ORAL | 0 refills | Status: DC

## 2021-09-25 NOTE — Telephone Encounter (Signed)
Patient is concerned about her would culture pos for MRSA and if she is on correct antibiotic. She is taking Augmentin. (Looking at Rx in chart I do not think quantity prescribed was adequate for prolonged treatment of 2 weeks?)

## 2021-09-25 NOTE — Telephone Encounter (Signed)
I spoke with patient and let her know that Dr. Dellis Filbert reviewed wound culture results and recommended a change in the antibiotic since the Augmentin was not tested for sensitivity.  Patient said she is better. The swelling has gone down and it is not as sore.  Advise Dr. Marguerita Merles recommend Clindamycin and will be sending the new Rx over to pharmacy this afternoon.

## 2021-09-26 ENCOUNTER — Telehealth: Payer: Self-pay

## 2021-09-26 DIAGNOSIS — S72141K Displaced intertrochanteric fracture of right femur, subsequent encounter for closed fracture with nonunion: Secondary | ICD-10-CM | POA: Diagnosis not present

## 2021-09-26 NOTE — Telephone Encounter (Signed)
Left message for pt to return our call.  Per Dr. Dellis Filbert ok to start Clindamycin is she experience diarrhea we can switch to Flagyl.

## 2021-09-26 NOTE — Telephone Encounter (Signed)
Patient picked up the Rx for Clindamycin that you prescribed for her yesterday.  She said she read the insert and there is a recommendation if patient has colitis to check with physician. She does have colitis and is very prone to diarrhea.    She is not taking it yet and continuing Augmentin until she is advised.

## 2021-09-26 NOTE — Telephone Encounter (Signed)
Patient informed with below.

## 2021-09-29 ENCOUNTER — Other Ambulatory Visit: Payer: Self-pay

## 2021-09-29 ENCOUNTER — Encounter: Payer: Self-pay | Admitting: Obstetrics & Gynecology

## 2021-09-29 ENCOUNTER — Ambulatory Visit (INDEPENDENT_AMBULATORY_CARE_PROVIDER_SITE_OTHER): Payer: Medicare Other | Admitting: Obstetrics & Gynecology

## 2021-09-29 VITALS — BP 108/70

## 2021-09-29 DIAGNOSIS — B3731 Acute candidiasis of vulva and vagina: Secondary | ICD-10-CM

## 2021-09-29 DIAGNOSIS — N764 Abscess of vulva: Secondary | ICD-10-CM | POA: Diagnosis not present

## 2021-09-29 MED ORDER — TERCONAZOLE 0.8 % VA CREA: 1.0000 | TOPICAL_CREAM | Freq: Every day | VAGINAL | 2 refills | Status: AC

## 2021-09-29 NOTE — Progress Notes (Signed)
    Debbie Houston February 03, 1941 [id number]        80 y.o.  [id number]   RP: F/U Vulvar abscess  HPI: Much improved on Clindamycin x 7 days so far.  Minimal left vulvar pain.  Not draining anymore.  Mild vulvar itching.  No fever.   OB History  Gravida Para Term Preterm AB Living  3 2 2  0 1 2  SAB IAB Ectopic Multiple Live Births  1 0 0 0      # Outcome Date GA Lbr Len/2nd Weight Sex Delivery Anes PTL Lv  3 SAB           2 Term           1 Term             Past medical history,surgical history, problem list, medications, allergies, family history and social history were all reviewed and documented in the EPIC chart.   Directed ROS with pertinent positives and negatives documented in the history of present illness/assessment and plan.  Exam:  Vitals:   09/29/21 1520  BP: 108/70   General appearance:  Normal  Gynecologic exam: Vulva:  Left erythema resolved.  Much improved induration and swelling.  No longer draining pus. NT.  Abscess culture:  MRSA sensitive to Clinda   Assessment/Plan:  80 y.o. [id number]   1. Left Vulvar abscess Much improved left vulvar abscess to MRSA after drainage and clindamycin antibiotic therapy for 7 days so far.  We will continue with clindamycin for another 7 days, total treatment of 14 days.  We will continue with warm sits baths and warm compresses at least twice a day.  Precautions reviewed.  Follow-up in 1 week.  2. Vulvovaginitis due to yeast Vulvovaginitis d/t yeast clinically.  Will treat with Terazol 3.  Usage reviewed and prescription sent to pharmacy.  Other orders - terconazole (TERAZOL 3) 0.8 % vaginal cream; Place 1 applicator vaginally at bedtime for 3 days.   Princess Bruins MD, 3:53 PM 09/29/2021

## 2021-10-01 ENCOUNTER — Encounter: Payer: Self-pay | Admitting: Obstetrics & Gynecology

## 2021-10-03 DIAGNOSIS — M47812 Spondylosis without myelopathy or radiculopathy, cervical region: Secondary | ICD-10-CM | POA: Diagnosis not present

## 2021-10-06 ENCOUNTER — Other Ambulatory Visit: Payer: Self-pay | Admitting: Orthopedic Surgery

## 2021-10-06 ENCOUNTER — Other Ambulatory Visit: Payer: Self-pay

## 2021-10-06 ENCOUNTER — Ambulatory Visit (INDEPENDENT_AMBULATORY_CARE_PROVIDER_SITE_OTHER): Payer: Medicare Other | Admitting: Obstetrics & Gynecology

## 2021-10-06 VITALS — BP 138/70

## 2021-10-06 DIAGNOSIS — Z22322 Carrier or suspected carrier of Methicillin resistant Staphylococcus aureus: Secondary | ICD-10-CM | POA: Diagnosis not present

## 2021-10-06 DIAGNOSIS — M25551 Pain in right hip: Secondary | ICD-10-CM

## 2021-10-06 DIAGNOSIS — N764 Abscess of vulva: Secondary | ICD-10-CM

## 2021-10-06 NOTE — Progress Notes (Signed)
    Debbie Houston 1941/10/13 [id number]        80 y.o.  [id number]   RP: F/U Vulvar Abscess  HPI: No vulvar pain, drainage, redness or swelling.  No fever.  MRSA sensitive to Clinda.  On Clinda PO x 11 days, 3 days to go.  C/O a new rash at the thorax x this morning.  No itching, no difficulty breathing.  Applied Hydroxycortisone 1% on it.   OB History  Gravida Para Term Preterm AB Living  3 2 2  0 1 2  SAB IAB Ectopic Multiple Live Births  1 0 0 0      # Outcome Date GA Lbr Len/2nd Weight Sex Delivery Anes PTL Lv  3 SAB           2 Term           1 Term             Past medical history,surgical history, problem list, medications, allergies, family history and social history were all reviewed and documented in the EPIC chart.   Directed ROS with pertinent positives and negatives documented in the history of present illness/assessment and plan.  Exam:  Vitals:   10/06/21 1508  BP: 138/70   General appearance:  Normal  Gynecologic exam: Rt vulva normal.  Lt vulva normal to inspection.  No erythema, no edema, no cyst/abscess.  No drainage.  On palpation, no tenderness.  Minimal area of residual induration.      Assessment/Plan:  80 y.o. [id number]   1. Left Vulvar abscess Resolved left vulvar abscess that on clindamycin.  The MRSA bacteria found on culture was sensitive to clindamycin.  We will finish the 3 days left of clindamycin to finish that 14-day treatment.  The localized rash on the thorax is probably not due to clindamycin, but precautions reviewed with patient.  2. MRSA (methicillin resistant staph aureus) culture positive  Counseling on MRSA.  Recommendations made to inform the medical staff of her positive MRSA culture if she presents to the hospital in the coming year.  Princess Bruins MD, 3:39 PM 10/06/2021

## 2021-10-07 ENCOUNTER — Encounter: Payer: Self-pay | Admitting: Obstetrics & Gynecology

## 2021-10-09 ENCOUNTER — Other Ambulatory Visit: Payer: Self-pay

## 2021-10-09 ENCOUNTER — Encounter: Payer: Self-pay | Admitting: Nurse Practitioner

## 2021-10-09 ENCOUNTER — Ambulatory Visit (INDEPENDENT_AMBULATORY_CARE_PROVIDER_SITE_OTHER): Payer: Medicare Other | Admitting: Nurse Practitioner

## 2021-10-09 VITALS — BP 120/62 | HR 54 | Temp 97.5°F | Wt 108.6 lb

## 2021-10-09 DIAGNOSIS — L270 Generalized skin eruption due to drugs and medicaments taken internally: Secondary | ICD-10-CM

## 2021-10-09 MED ORDER — DEXAMETHASONE 1 MG PO TABS: ORAL_TABLET | ORAL | 0 refills | Status: DC

## 2021-10-09 MED ORDER — BISACODYL 5 MG TABLET,DELAYED RELEASE
ORAL_TABLET | Freq: Once | ORAL | 0 refills | 1 days | Status: CP
Start: 2021-10-09 — End: 2021-10-10
  Filled 2021-10-10: qty 2, 1d supply, fill #0

## 2021-10-09 MED ORDER — POLYETHYLENE GLYCOL 3350 17 GRAM/DOSE ORAL POWDER
Freq: Once | ORAL | 0 refills | 1.00000 days | Status: CP
Start: 2021-10-09 — End: 2021-10-10
  Filled 2021-10-10: qty 238, 1d supply, fill #0
  Filled 2021-10-10: qty 119, 1d supply, fill #0

## 2021-10-09 MED ORDER — SIMETHICONE 125 MG CHEWABLE TABLET
ORAL_TABLET | 0 refills | 0 days | Status: CP
Start: 2021-10-09 — End: ?
  Filled 2021-10-10: qty 4, 2d supply, fill #0

## 2021-10-09 NOTE — Patient Instructions (Signed)
Drug Rash A drug rash occurs when a medicine causes a change in the color or texture of the skin. It can develop minutes, hours, or days after you take the medicine. The rash may appear on a small area of skin or all over your body. What are the causes? This condition may be caused by one of these three conditions: An allergic reaction to the medicine. An unwanted side effect of a certain medicine. Extreme sensitivity to sunlight caused by the medicine. What increases the risk? If you take any of these medicines that make your skin sensitive to light and are exposed to sunlight, it can make you more likely to develop this condition: Antibiotics, including tetracyclines and sulfa medicines. Antifungals. Antihistamines. Diuretics. Retinoids, such as isotretinoin. Statins. NSAIDs. What are the signs or symptoms? Symptoms of this condition include: Redness. Tiny bumps. Peeling. Itching. Itchy welts (hives). Swelling. How is this diagnosed? This condition may be diagnosed based on: A physical exam. Tests to find out which medicine caused the rash. These tests may include: Skin tests. Blood tests. How is this treated? This condition is treated with medicines, including: Antihistamine. This may be given to relieve itching. NSAIDs. These may be given to reduce swelling and to treat pain. A steroid medicine. This may be given to reduce swelling. The rash usually goes away when you stop taking the medicine that caused it. Follow these instructions at home: Take over-the-counter and prescription medicines only as told by your health care provider. Tell all your health care providers about any medicine reactions that you have had in the past. If your rash was caused by sensitivity to sunlight, and while your rash is healing: Avoid being in the sun if possible, especially when it is strongest, usually between 10 a.m. and 4 p.m. Cover your skin with pants, long sleeves, and a hat when you  are exposed to sunlight. If you have hives: Take a cool shower or use a cool compress to relieve itchiness. Take over-the-counter antihistamines, as recommended by your health care provider, until the hives are gone. Hives are not contagious. Keep all follow-up visits. This is important. Contact a health care provider if: You have fever. Your rash is not going away. Your rash gets worse. Your rash comes back. You have high-pitched whistling sounds when you breathe, most often when you breathe out (wheezing) or coughing. Get help right away if: You start to have breathing problems. You start to have shortness of breath. Your face or throat starts to swell. You have severe weakness with dizziness or fainting. You have chest pain. Your skin starts to blister and peel. These symptoms may represent a serious problem that is an emergency. Do not wait to see if the symptoms will go away. Get medical help right away. Call your local emergency services (911 in the [place] Do not drive yourself to the hospital. Summary A drug rash occurs when a medicine causes a change in the color or texture of the skin. The rash may appear on a small area of skin or all over your body. It can develop minutes, hours, or days after you take the medicine. Your health care provider will do various tests to determine what medicine caused your rash. The rash may be treated with medicine to relieve itching, swelling, and pain. This information is not intended to replace advice given to you by your health care provider. Make sure you discuss any questions you have with your health care provider. Document Revised: 04/17/2021 Document  Reviewed: 04/17/2021 Elsevier Patient Education  [place].

## 2021-10-09 NOTE — Progress Notes (Signed)
Assessment and Plan: Debbie Houston was seen today for acute visit.  Diagnoses and all orders for this visit:  Drug eruption -     dexamethasone (DECADRON) 1 MG tablet; Take 3 tabs for 3 days, 2 tabs for 3 days 1 tab for 5 days. Take with food Use Benadryl 25 mg every 6 hours and 50 mg at bedtime If she develops difficulty swallowing, tongue swelling, shortness of breath she is to go to the ER.    Further disposition pending results of labs. Discussed med's effects and SE's.   Over 20 minutes of exam, counseling, chart review, and critical decision making was performed.   Future Appointments  Date Time Provider [place]  10/25/2021  9:30 AM Rex Kras, DO [facility] None  10/26/2021  2:20 PM [facility] 1 [facility] [facility]  11/21/2021  9:30 AM Unk Pinto, MD [facility] None  02/08/2022 10:20 AM Ofilia Neas, PA-C [facility] None  05/15/2022  3:00 PM Unk Pinto, MD [facility] None  08/21/2022 10:00 AM Magda Bernheim, NP [facility] None    ------------------------------------------------------------------------------------------------------------------   HPI BP 120/62   Pulse (!) 54   Temp (!) 97.5 F (36.4 C)   Wt 108 lb 9.6 oz (49.3 kg)   SpO2 98%   BMI 18.64 kg/m  80 y.o.female presents for rash  Pt developed a rash on trunk of body 4 days ago , not pruritic. Has been on Clindamycin for 10 days. Today she is stopping Clindamycin. She has been taking Benadryl x several days.  It has not helped the rash stop. She is reluctant to take steroids.  Denies difficulty breathing, swelling of tongue.   BP Readings from Last 3 Encounters:  10/09/21 120/62  10/06/21 138/70  09/29/21 108/70    BMI is Body mass index is 18.64 kg/m., she has not been working on diet and exercise. Wt Readings from Last 3 Encounters:  10/09/21 108 lb 9.6 oz (49.3 kg)  09/21/21 106 lb (48.1 kg)  09/20/21 108 lb (49 kg)     Past Medical History:  Diagnosis Date   Arthritis    Benign labile  hypertension    Cancer (HCC)    skin cancer on face   Colitis    DJD (degenerative joint disease)    Endometrial polyp    GERD (gastroesophageal reflux disease)    History of basal cell carcinoma excision    History of esophagitis    HOH (hard of hearing)    right ear better than left  -- wears no aides   Hyperlipidemia    Hypothyroidism    Osteopenia    Prediabetes    S/P right hip fracture 08/17/2020   Vitamin D deficiency    Wears glasses      Allergies  Allergen Reactions   Fosamax [Alendronate Sodium] Other (See Comments)    GI upset    Singulair [Montelukast Sodium] Other (See Comments)    Makes pt jittery   Wellbutrin [Bupropion] Hives   Sulfa Antibiotics Rash    Current Outpatient Medications on File Prior to Visit  Medication Sig   acetaminophen (TYLENOL) 500 MG tablet Take 500 mg by mouth at bedtime as needed for moderate pain or headache.    Ascorbic Acid (VITAMIN C) 1000 MG tablet Take 1,000 mg by mouth daily.    aspirin EC 81 MG tablet Take 81 mg by mouth daily.    bisoprolol (ZEBETA) 5 MG tablet TAKE 1/2 TABLET BY MOUTH EVERY DAY IN THE EVENING FOR BLOOD  PRESSURE   Calcium Carbonate-Vitamin D 600-400 MG-UNIT tablet Take 1 tablet by mouth daily.   cetirizine (ZYRTEC) 10 MG tablet Take 10 mg by mouth daily.   Cholecalciferol 50 MCG (2000 UT) TABS Take by mouth.   Cinnamon 500 MG TABS Take 500 mg by mouth daily.    clindamycin (CLEOCIN) 300 MG capsule Take 1 capsule (300 mg total) by mouth 4 (four) times daily for 14 days.   Coenzyme Q10 (COQ10) 200 MG CAPS Take 200 mg by mouth daily.   colestipol (COLESTID) 1 g tablet    dicyclomine (BENTYL) 20 MG tablet Take   1 tablet     3 x /day     before Meals   levothyroxine (SYNTHROID) 125 MCG tablet TAKE 1 TABLET BY MOUTH DAILY ON AN EMPTY STOMACH WITH ONLY WATER FOR 30 MINUTES NO ANTACID MEDS, CALCIUM OR MAGNESIUM FOR 4 HOURS   loratadine (CLARITIN) 10 MG tablet Take by mouth.   Multiple Vitamin (THERA VITAMIN PO)  Take 1 tablet by mouth daily.   Omega-3 Fatty Acids (FISH OIL) 1200 MG CAPS Take 1,200 mg by mouth daily.    pantoprazole (PROTONIX) 40 MG tablet Take 1 tablet  2 x /day  for Heartburn & Indigestion   Polyethyl Glycol-Propyl Glycol 0.4-0.3 % SOLN Place 1 drop into both eyes at bedtime.   Probiotic Product (PROBIOTIC PO) Take 1 capsule by mouth daily.   PROLIA 60 MG/ML SOSY injection Inject 60 mg into the skin every 6 (six) months.   simvastatin (ZOCOR) 20 MG tablet TAKE 1 TABLET BY MOUTH AT BEDTIME FOR CHOLESTEROL   Vedolizumab (ENTYVIO IV) Inject into the vein.   No current facility-administered medications on file prior to visit.    ROS: all negative except above.   Physical Exam:  BP 120/62   Pulse (!) 54   Temp (!) 97.5 F (36.4 C)   Wt 108 lb 9.6 oz (49.3 kg)   SpO2 98%   BMI 18.64 kg/m   General Appearance: Well nourished, in no apparent distress. Eyes: PERRLA, EOMs, conjunctiva no swelling or erythema Sinuses: No Frontal/maxillary tenderness ENT/Mouth: Ext aud canals clear, TMs without erythema, bulging. No erythema, swelling, or exudate on post pharynx.  Tonsils not swollen or erythematous. Hearing normal.  Neck: Supple, thyroid normal.  Respiratory: Respiratory effort normal, BS equal bilaterally without rales, rhonchi, wheezing or stridor.  Cardio: RRR with no MRGs. Brisk peripheral pulses without edema.  Abdomen: Soft, + BS.  Non tender, no guarding, rebound, hernias, masses. Lymphatics: Non tender without lymphadenopathy.  Musculoskeletal: Full ROM, 5/5 strength, normal gait.  Skin: Warm, dry . Drug eruption rash on trunk Neuro: Cranial nerves intact. Normal muscle tone, no cerebellar symptoms. Sensation intact.  Psych: Awake and oriented X 3, normal affect, Insight and Judgment appropriate.     Magda Bernheim, NP 2:49 PM [facility]

## 2021-10-16 ENCOUNTER — Encounter: Admit: 2021-10-16 | Discharge: 2021-10-16 | Payer: MEDICARE | Attending: Anesthesiology | Primary: Anesthesiology

## 2021-10-16 ENCOUNTER — Ambulatory Visit: Admit: 2021-10-16 | Discharge: 2021-10-16 | Payer: MEDICARE

## 2021-10-16 DIAGNOSIS — D649 Anemia, unspecified: Secondary | ICD-10-CM | POA: Diagnosis not present

## 2021-10-16 DIAGNOSIS — Z87891 Personal history of nicotine dependence: Secondary | ICD-10-CM | POA: Diagnosis not present

## 2021-10-16 DIAGNOSIS — I10 Essential (primary) hypertension: Secondary | ICD-10-CM | POA: Diagnosis not present

## 2021-10-16 DIAGNOSIS — K3189 Other diseases of stomach and duodenum: Secondary | ICD-10-CM | POA: Diagnosis not present

## 2021-10-16 DIAGNOSIS — K523 Indeterminate colitis: Secondary | ICD-10-CM | POA: Diagnosis not present

## 2021-10-16 DIAGNOSIS — K52839 Microscopic colitis, unspecified: Secondary | ICD-10-CM | POA: Diagnosis not present

## 2021-10-16 DIAGNOSIS — Z7982 Long term (current) use of aspirin: Secondary | ICD-10-CM | POA: Diagnosis not present

## 2021-10-16 DIAGNOSIS — Z7952 Long term (current) use of systemic steroids: Secondary | ICD-10-CM | POA: Diagnosis not present

## 2021-10-16 DIAGNOSIS — Z79890 Hormone replacement therapy (postmenopausal): Secondary | ICD-10-CM | POA: Diagnosis not present

## 2021-10-16 DIAGNOSIS — M1990 Unspecified osteoarthritis, unspecified site: Secondary | ICD-10-CM | POA: Diagnosis not present

## 2021-10-16 DIAGNOSIS — H9190 Unspecified hearing loss, unspecified ear: Secondary | ICD-10-CM | POA: Diagnosis not present

## 2021-10-16 DIAGNOSIS — K449 Diaphragmatic hernia without obstruction or gangrene: Secondary | ICD-10-CM | POA: Diagnosis not present

## 2021-10-16 DIAGNOSIS — Z79899 Other long term (current) drug therapy: Secondary | ICD-10-CM | POA: Diagnosis not present

## 2021-10-16 DIAGNOSIS — K6389 Other specified diseases of intestine: Secondary | ICD-10-CM | POA: Diagnosis not present

## 2021-10-16 DIAGNOSIS — K219 Gastro-esophageal reflux disease without esophagitis: Secondary | ICD-10-CM | POA: Diagnosis not present

## 2021-10-16 DIAGNOSIS — K2950 Unspecified chronic gastritis without bleeding: Secondary | ICD-10-CM | POA: Diagnosis not present

## 2021-10-16 DIAGNOSIS — E039 Hypothyroidism, unspecified: Secondary | ICD-10-CM | POA: Diagnosis not present

## 2021-10-16 DIAGNOSIS — Z882 Allergy status to sulfonamides status: Secondary | ICD-10-CM | POA: Diagnosis not present

## 2021-10-16 DIAGNOSIS — E7800 Pure hypercholesterolemia, unspecified: Secondary | ICD-10-CM | POA: Diagnosis not present

## 2021-10-16 LAB — HM COLONOSCOPY

## 2021-10-24 NOTE — Progress Notes (Signed)
Date:  10/25/2021   ID:  Debbie Houston, DOB October 16, 1941, MRN [id number]  PCP:  Unk Pinto, MD  Cardiologist:  Rex Kras, DO, [facility]  (established care 10/25/2021)  REASON FOR CONSULT: Pre-operative cardiovascular examination  REQUESTING PHYSICIAN:  Unk Pinto, MD [address] [place],  [place] [address]  Chief Complaint  Patient presents with  .  Pre-operative cardiovascular examination  . New Patient (Initial Visit)    Referred by Dr. Zachery Dakins    HPI  Debbie Houston is a 80 y.o. Caucasian female who presents to the office with a chief complaint of " clearance." Patient's past medical history and cardiovascular risk factors include:hypertension, right bundle branch block, hyperlipidemia, GERD, neuropathy, osteoporosis, former smoker, post menopausal, advance age.   She is referred to the office at the request of Unk Pinto, MD for evaluation of pre-operative cardiovascular examination.  Patient plans to undergo right hip conversion from IM nail to total hip replacement with Dr. Tamala Julian, [facility] orthopedic specialist, date to be determined.  Review of systems are positive for chest pain.  Occurring for the last 1 year, intermittent, has been contributing her symptoms to her underlying GERD, not effort related, does not resolve with resting, soft limiting, duration for a few minutes, nonradiating, unable to quantify intensity, describes it as a tightness like sensation, no diaphoresis.  Patient has not had any prior cardiac work-up.  Denies prior history of coronary artery disease, myocardial infarction, congestive heart failure, deep venous thrombosis, pulmonary embolism, stroke, transient ischemic attack.  FUNCTIONAL STATUS: Water aerobics regularly however, limited now due to hip and back pain.    ALLERGIES: Allergies  Allergen Reactions  . Fosamax [Alendronate Sodium] Other (See Comments)    GI upset   . Singulair [Montelukast  Sodium] Other (See Comments)    Makes pt jittery  . Wellbutrin [Bupropion] Hives  . Clindamycin/Lincomycin Rash  . Sulfa Antibiotics Rash    MEDICATION LIST PRIOR TO VISIT: Current Meds  Medication Sig  . acetaminophen (TYLENOL) 500 MG tablet Take 500 mg by mouth at bedtime as needed for moderate pain or headache.   . Ascorbic Acid (VITAMIN C) 1000 MG tablet Take 1,000 mg by mouth daily.   Marland Kitchen aspirin EC 81 MG tablet Take 81 mg by mouth daily.   . bisoprolol (ZEBETA) 5 MG tablet TAKE 1/2 TABLET BY MOUTH EVERY DAY IN THE EVENING FOR BLOOD PRESSURE  . Calcium Carbonate-Vitamin D 600-400 MG-UNIT tablet Take 1 tablet by mouth daily.  . cetirizine (ZYRTEC) 10 MG tablet Take 10 mg by mouth daily.  . Cholecalciferol 50 MCG (2000 UT) TABS Take by mouth.  . Cinnamon 500 MG TABS Take 500 mg by mouth daily.   . Coenzyme Q10 (COQ10) 200 MG CAPS Take 200 mg by mouth daily.  . colestipol (COLESTID) 1 g tablet   . levothyroxine (SYNTHROID) 125 MCG tablet TAKE 1 TABLET BY MOUTH DAILY ON AN EMPTY STOMACH WITH ONLY WATER FOR 30 MINUTES NO ANTACID MEDS, CALCIUM OR MAGNESIUM FOR 4 HOURS  . loratadine (CLARITIN) 10 MG tablet Take by mouth.  . Multiple Vitamin (THERA VITAMIN PO) Take 1 tablet by mouth daily.  . Omega-3 Fatty Acids (FISH OIL) 1200 MG CAPS Take 1,200 mg by mouth daily.   . pantoprazole (PROTONIX) 40 MG tablet Take 1 tablet  2 x /day  for Heartburn & Indigestion  . Polyethyl Glycol-Propyl Glycol 0.4-0.3 % SOLN Place 1 drop into both eyes at bedtime.  . Probiotic Product (PROBIOTIC  PO) Take 1 capsule by mouth daily.  Marland Kitchen PROLIA 60 MG/ML SOSY injection Inject 60 mg into the skin every 6 (six) months.  . simvastatin (ZOCOR) 20 MG tablet TAKE 1 TABLET BY MOUTH AT BEDTIME FOR CHOLESTEROL  . Vedolizumab (ENTYVIO IV) Inject into the vein.     PAST MEDICAL HISTORY: Past Medical History:  Diagnosis Date  . Arthritis   . Benign labile hypertension   . Cancer ([place])    skin cancer on face  . Colitis    . DJD (degenerative joint disease)   . Endometrial polyp   . GERD (gastroesophageal reflux disease)   . History of basal cell carcinoma excision   . History of esophagitis   . HOH (hard of hearing)    right ear better than left  -- wears no aides  . Hyperlipidemia   . Hypothyroidism   . Osteopenia   . Prediabetes   . S/P right hip fracture 08/17/2020  . Vitamin D deficiency   . Wears glasses     PAST SURGICAL HISTORY: Past Surgical History:  Procedure Laterality Date  . BACK SURGERY  2020  . CATARACT EXTRACTION, BILATERAL Bilateral 2020  . DILATATION & CURETTAGE/HYSTEROSCOPY WITH MYOSURE N/A 10/27/2014   Procedure: DILATATION & CURETTAGE/HYSTEROSCOPY WITH MYOSURE;  Surgeon: Darlyn Chamber, MD;  Location: [facility];  Service: Gynecology;  Laterality: N/A;  . DILATION AND CURETTAGE OF UTERUS    . HIP SURGERY Right 04/2020   rod and screw in right hip   . MOHS SURGERY  2005   LEFT NASAL BRIDGE FOR BASAL CELL  . TONSILLECTOMY  age 68    FAMILY HISTORY: The patient family history includes Autism in her son; Autism spectrum disorder in her son; Cancer in her father, maternal grandmother, mother, and sister; Colitis in her son; Colon cancer (age of onset: 39) in her brother; Diabetes in her brother and maternal grandfather; Healthy in her son; Hyperlipidemia in her brother, father, maternal grandfather, maternal grandmother, mother, paternal grandfather, paternal grandmother, and sister; Hypertension in her brother and mother; Stroke in her maternal grandfather.  SOCIAL HISTORY:  The patient  reports that she quit smoking about 20 years ago. Her smoking use included cigarettes. She has a 3.75 pack-year smoking history. She has never used smokeless tobacco. She reports that she does not currently use alcohol. She reports that she does not use drugs.  REVIEW OF SYSTEMS: Review of Systems  Constitutional: Negative for chills and fever.  HENT:  Negative for hoarse voice  and nosebleeds.   Eyes:  Negative for discharge, double vision and pain.  Cardiovascular:  Positive for chest pain. Negative for claudication, dyspnea on exertion, leg swelling, near-syncope, orthopnea, palpitations, paroxysmal nocturnal dyspnea and syncope.  Respiratory:  Negative for hemoptysis and shortness of breath.   Musculoskeletal:  Negative for muscle cramps and myalgias.  Gastrointestinal:  Negative for abdominal pain, constipation, diarrhea, hematemesis, hematochezia, melena, nausea and vomiting.  Neurological:  Negative for dizziness and light-headedness.   PHYSICAL EXAM: Vitals with BMI 10/25/2021 10/09/2021 10/06/2021  Height 5' 4"  - -  Weight 109 lbs 6 oz 108 lbs 10 oz -  BMI 00.17 - -  Systolic 494 496 759  Diastolic 72 62 70  Pulse 70 54 -    CONSTITUTIONAL: Well-developed and well-nourished. No acute distress.  SKIN: Skin is warm and dry. No rash noted. No cyanosis. No pallor. No jaundice HEAD: Normocephalic and atraumatic.  EYES: No scleral icterus MOUTH/THROAT: Moist oral membranes.  NECK:  No JVD present. No thyromegaly noted. No carotid bruits  LYMPHATIC: No visible cervical adenopathy.  CHEST Normal respiratory effort. No intercostal retractions  LUNGS: Clear to auscultation bilaterally.  No stridor. No wheezes. No rales.  CARDIOVASCULAR: Regular rate and rhythm, positive S1-S2, no murmurs rubs or gallops appreciated. ABDOMINAL: Obese, soft, nontender, nondistended, positive bowel sounds all 4 quadrants. No apparent ascites.  EXTREMITIES: No peripheral edema, warm to touch, +2 bilateral DP and PT pulses HEMATOLOGIC: No significant bruising NEUROLOGIC: Oriented to person, place, and time. Nonfocal. Normal muscle tone.  PSYCHIATRIC: Normal mood and affect. Normal behavior. Cooperative  CARDIAC DATABASE: EKG: 10/25/2021: NSR, 69 bpm, RBBB, without underlying injury pattern.   Echocardiogram: No results found for this or any previous visit from the past 1095  days.   Stress Testing: No results found for this or any previous visit from the past 1095 days.  Heart Catheterization: None  LABORATORY DATA: CBC Latest Ref Rng & Units 08/18/2021 08/06/2021 06/30/2021  WBC 3.8 - 10.8 Thousand/uL 5.6 11.3(H) 8.2  Hemoglobin 11.7 - 15.5 g/dL 10.2(L) 12.1 12.7  Hematocrit 35.0 - 45.0 % 31.1(L) 36.7 39.1  Platelets 140 - 400 Thousand/uL 261 355 226    CMP Latest Ref Rng & Units 08/18/2021 08/06/2021 07/10/2021  Glucose 65 - 99 mg/dL 79 100(H) -  BUN 7 - 25 mg/dL 10 12 -  Creatinine 0.60 - 0.95 mg/dL 0.75 0.86 -  Sodium 135 - 146 mmol/L 135 130(L) -  Potassium 3.5 - 5.3 mmol/L 4.2 3.8 -  Chloride 98 - 110 mmol/L 103 92(L) -  CO2 20 - 32 mmol/L 24 25 -  Calcium 8.6 - 10.4 mg/dL 8.6 10.6(H) -  Total Protein 6.1 - 8.1 g/dL 5.4(L) 7.3 5.9(L)  Total Bilirubin 0.2 - 1.2 mg/dL 0.3 0.8 -  Alkaline Phos 38 - 126 U/L - 49 -  AST 10 - 35 U/L 17 17 -  ALT 6 - 29 U/L 13 12 -    Lipid Panel     Component Value Date/Time   CHOL 158 05/15/2021 1513   TRIG 108 05/15/2021 1513   HDL 63 05/15/2021 1513   CHOLHDL 2.5 05/15/2021 1513   VLDL 13 05/03/2017 1207   LDLCALC 76 05/15/2021 1513    No components found for: NTPROBNP No results for input(s): PROBNP in the last 8760 hours. Recent Labs    05/15/21 1513 06/14/21 0922 08/18/21 0927  TSH 0.18* 1.42 5.97*    BMP Recent Labs    11/23/20 1428 05/15/21 1513 06/14/21 0922 06/30/21 1122 08/06/21 1258 08/18/21 0927  NA 132* 134*   < > 134* 130* 135  K 3.6 4.3   < > 4.6 3.8 4.2  CL 98 100   < > 101 92* 103  CO2 23 27   < > 26 25 24   GLUCOSE 134* 135*   < > 90 100* 79  BUN 12 13   < > 9 12 10   CREATININE 0.89 0.76   < > 0.67 0.86 0.75  CALCIUM 9.2 9.3   < > 8.7 10.6* 8.6  GFRNONAA 62 75  --   --  >60  --   GFRAA 71 86  --   --   --   --    < > = values in this interval not displayed.    HEMOGLOBIN A1C Lab Results  Component Value Date   HGBA1C 5.5 05/15/2021   MPG 111 05/15/2021     IMPRESSION:    ICD-10-CM   1.  Pre-operative cardiovascular examination  Z01.810 EKG 12-Lead    PCV ECHOCARDIOGRAM COMPLETE    PCV MYOCARDIAL PERFUSION WITH LEXISCAN    2. Precordial pain  R07.2 PCV ECHOCARDIOGRAM COMPLETE    PCV MYOCARDIAL PERFUSION WITH LEXISCAN    3. Benign hypertension  I10     4. Mixed hyperlipidemia  E78.2     5. RBBB  I45.10 PCV ECHOCARDIOGRAM COMPLETE    PCV MYOCARDIAL PERFUSION WITH LEXISCAN    6. Former smoker  Z87.891        RECOMMENDATIONS: Debbie Houston is a 80 y.o. Caucasian female whose past medical history and cardiac risk factors include: hypertension, right bundle branch block, hyperlipidemia, GERD, neuropathy, osteoporosis, former smoker, post menopausal, advance age.   Pre-operative cardiovascular examination Planning to undergo right hip replacement, date to be determined EKG: Normal sinus rhythm with right bundle branch block. Review of systems positive for precordial discomfort as described above. Given her age, risk factors, and upcoming noncardiac surgery the shared decision was to proceed with echocardiogram and stress test. Further recommendations to follow  Precordial pain Precordial discomfort appears to be predominately noncardiac. However, given her upcoming noncardiac surgery recommended ischemic work-up. Given her underlying right bundle branch block and the inability to exercise due to right hip pain recommend pharmacological stress test. Echocardiogram will be ordered to evaluate for structural heart disease and left ventricular systolic function.  Benign hypertension Blood pressures are within acceptable range. Medications reconciled. Currently managed by primary care provider.  Mixed hyperlipidemia Currently on simvastatin Last LDL 76 mg/dL. Does not endorse myalgias. Currently managed by primary care provider.  RBBB Monitor for now.  Former smoker Educated on the importance of continued smoking  cessation.  FINAL MEDICATION LIST END OF ENCOUNTER: No orders of the defined types were placed in this encounter.   Medications Discontinued During This Encounter  Medication Reason  . dicyclomine (BENTYL) 20 MG tablet   . dexamethasone (DECADRON) 1 MG tablet      Current Outpatient Medications:  .  acetaminophen (TYLENOL) 500 MG tablet, Take 500 mg by mouth at bedtime as needed for moderate pain or headache. , Disp: , Rfl:  .  Ascorbic Acid (VITAMIN C) 1000 MG tablet, Take 1,000 mg by mouth daily. , Disp: , Rfl:  .  aspirin EC 81 MG tablet, Take 81 mg by mouth daily. , Disp: , Rfl:  .  bisoprolol (ZEBETA) 5 MG tablet, TAKE 1/2 TABLET BY MOUTH EVERY DAY IN THE EVENING FOR BLOOD PRESSURE, Disp: 45 tablet, Rfl: 3 .  Calcium Carbonate-Vitamin D 600-400 MG-UNIT tablet, Take 1 tablet by mouth daily., Disp: , Rfl:  .  cetirizine (ZYRTEC) 10 MG tablet, Take 10 mg by mouth daily., Disp: , Rfl:  .  Cholecalciferol 50 MCG (2000 UT) TABS, Take by mouth., Disp: , Rfl:  .  Cinnamon 500 MG TABS, Take 500 mg by mouth daily. , Disp: , Rfl:  .  Coenzyme Q10 (COQ10) 200 MG CAPS, Take 200 mg by mouth daily., Disp: , Rfl:  .  colestipol (COLESTID) 1 g tablet, , Disp: , Rfl:  .  levothyroxine (SYNTHROID) 125 MCG tablet, TAKE 1 TABLET BY MOUTH DAILY ON AN EMPTY STOMACH WITH ONLY WATER FOR 30 MINUTES NO ANTACID MEDS, CALCIUM OR MAGNESIUM FOR 4 HOURS, Disp: 90 tablet, Rfl: 0 .  loratadine (CLARITIN) 10 MG tablet, Take by mouth., Disp: , Rfl:  .  Multiple Vitamin (THERA VITAMIN PO), Take 1 tablet by mouth daily., Disp: , Rfl:  .  Omega-3 Fatty Acids (FISH OIL) 1200 MG CAPS, Take 1,200 mg by mouth daily. , Disp: , Rfl:  .  pantoprazole (PROTONIX) 40 MG tablet, Take 1 tablet  2 x /day  for Heartburn & Indigestion, Disp: 180 tablet, Rfl: 0 .  Polyethyl Glycol-Propyl Glycol 0.4-0.3 % SOLN, Place 1 drop into both eyes at bedtime., Disp: , Rfl:  .  Probiotic Product (PROBIOTIC PO), Take 1 capsule by mouth daily., Disp:  , Rfl:  .  PROLIA 60 MG/ML SOSY injection, Inject 60 mg into the skin every 6 (six) months., Disp: , Rfl:  .  simvastatin (ZOCOR) 20 MG tablet, TAKE 1 TABLET BY MOUTH AT BEDTIME FOR CHOLESTEROL, Disp: 90 tablet, Rfl: 1 .  Vedolizumab (ENTYVIO IV), Inject into the vein., Disp: , Rfl:   Orders Placed This Encounter  Procedures  . PCV MYOCARDIAL PERFUSION WITH LEXISCAN  . EKG 12-Lead  . PCV ECHOCARDIOGRAM COMPLETE    There are no Patient Instructions on file for this visit.   --Continue cardiac medications as reconciled in final medication list. --Return in about 6 months (around 04/25/2022) for Follow up post -op . Or sooner if needed. --Continue follow-up with your primary care physician regarding the management of your other chronic comorbid conditions.  Patient's questions and concerns were addressed to her satisfaction. She voices understanding of the instructions provided during this encounter.   This note was created using a voice recognition software as a result there may be grammatical errors inadvertently enclosed that do not reflect the nature of this encounter. Every attempt is made to correct such errors.  Rex Kras, [place], [facility]  Pager: [phone] Office: [phone]

## 2021-10-25 ENCOUNTER — Encounter: Payer: Self-pay | Admitting: Cardiology

## 2021-10-25 ENCOUNTER — Other Ambulatory Visit: Payer: Self-pay

## 2021-10-25 ENCOUNTER — Ambulatory Visit: Payer: Medicare Other | Admitting: Cardiology

## 2021-10-25 VITALS — BP 139/72 | HR 70 | Temp 97.8°F | Resp 18 | Ht 64.0 in | Wt 109.4 lb

## 2021-10-25 DIAGNOSIS — Z87891 Personal history of nicotine dependence: Secondary | ICD-10-CM | POA: Diagnosis not present

## 2021-10-25 DIAGNOSIS — I10 Essential (primary) hypertension: Secondary | ICD-10-CM | POA: Diagnosis not present

## 2021-10-25 DIAGNOSIS — E782 Mixed hyperlipidemia: Secondary | ICD-10-CM

## 2021-10-25 DIAGNOSIS — R072 Precordial pain: Secondary | ICD-10-CM | POA: Diagnosis not present

## 2021-10-25 DIAGNOSIS — I4510 Unspecified right bundle-branch block: Secondary | ICD-10-CM | POA: Diagnosis not present

## 2021-10-25 DIAGNOSIS — Z01810 Encounter for preprocedural cardiovascular examination: Secondary | ICD-10-CM | POA: Diagnosis not present

## 2021-10-26 ENCOUNTER — Telehealth: Payer: Self-pay | Admitting: Internal Medicine

## 2021-10-26 ENCOUNTER — Ambulatory Visit
Admission: RE | Admit: 2021-10-26 | Discharge: 2021-10-26 | Disposition: A | Discharge status: Home / Self Care | Payer: Medicare Other | Source: Ambulatory Visit | Attending: Orthopedic Surgery | Admitting: Orthopedic Surgery

## 2021-10-26 DIAGNOSIS — Z981 Arthrodesis status: Secondary | ICD-10-CM | POA: Diagnosis not present

## 2021-10-26 DIAGNOSIS — D259 Leiomyoma of uterus, unspecified: Secondary | ICD-10-CM | POA: Diagnosis not present

## 2021-10-26 DIAGNOSIS — M1611 Unilateral primary osteoarthritis, right hip: Secondary | ICD-10-CM | POA: Diagnosis not present

## 2021-10-26 DIAGNOSIS — M25551 Pain in right hip: Secondary | ICD-10-CM

## 2021-10-26 NOTE — Chronic Care Management (AMB) (Signed)
  Chronic Care Management   Outreach Note  10/26/2021 Name: Debbie Houston MRN: [id number] DOB: 28-Mar-1941  Referred by: Unk Pinto, MD Reason for referral : No chief complaint on file.   An unsuccessful telephone outreach was attempted today. The patient was referred to the pharmacist for assistance with care management and care coordination.   Follow Up Plan:   Tatjana Dellinger Upstream Scheduler

## 2021-10-26 NOTE — Progress Notes (Signed)
  Chronic Care Management   Note  10/26/2021 Name: CANAAN PRUE MRN: [id number] DOB: [birth year]  [name] is a 80 y.o. year old female who is a primary care patient of Unk Pinto, MD. I reached out to [name] by phone today in response to a referral sent by Ms. Kreg Shropshire PCP, Unk Pinto, MD.   Ms. Agro was given information about Chronic Care Management services today including:  CCM service includes personalized support from designated clinical staff supervised by her physician, including individualized plan of care and coordination with other care providers 24/7 contact phone numbers for assistance for urgent and routine care needs. Service will only be billed when office clinical staff spend 20 minutes or more in a month to coordinate care. Only one practitioner may furnish and bill the service in a calendar month. The patient may stop CCM services at any time (effective at the end of the month) by phone call to the office staff.   Patient agreed to services and verbal consent obtained.   Follow up plan:   Tatjana Secretary/administrator

## 2021-10-27 DIAGNOSIS — K51919 Ulcerative colitis, unspecified with unspecified complications: Secondary | ICD-10-CM | POA: Diagnosis not present

## 2021-10-29 NOTE — Telephone Encounter (Signed)
Please follow up with pt regarding scheduling Evenity injection.   If pt chooses not to proceed with Evenity inj, please let me know so that pt can be archived in [employer] portal.

## 2021-10-31 ENCOUNTER — Telehealth: Payer: Self-pay | Admitting: Cardiology

## 2021-11-01 ENCOUNTER — Encounter: Payer: Self-pay | Admitting: Internal Medicine

## 2021-11-02 ENCOUNTER — Other Ambulatory Visit: Payer: Self-pay

## 2021-11-02 ENCOUNTER — Ambulatory Visit: Payer: Medicare Other

## 2021-11-02 DIAGNOSIS — R072 Precordial pain: Secondary | ICD-10-CM

## 2021-11-02 DIAGNOSIS — Z01810 Encounter for preprocedural cardiovascular examination: Secondary | ICD-10-CM

## 2021-11-02 DIAGNOSIS — I4510 Unspecified right bundle-branch block: Secondary | ICD-10-CM

## 2021-11-02 DIAGNOSIS — M25551 Pain in right hip: Secondary | ICD-10-CM | POA: Diagnosis not present

## 2021-11-02 NOTE — Telephone Encounter (Signed)
Left message for patient to call back  

## 2021-11-02 NOTE — Telephone Encounter (Addendum)
I spoke with patient- she states she has had some "other issues" and she has a tentative hip replacement surgery scheduled in January 2023. She is going to speak with her surgeon today and let us know when she wants to start Evenity.

## 2021-11-14 NOTE — Progress Notes (Signed)
Future Appointments  Date Time Provider Department  11/15/2021 10:30 AM Unk Pinto, MD [facility]  11/21/2021  9:30 AM Unk Pinto, MD [facility]  12/13/2021 10:30 AM Newton Pigg, [facility]  02/08/2022 10:20 AM Ofilia Neas, PA-C [facility]  04/24/2022 10:30 AM Rex Kras, DO [facility]  05/15/2022  3:00 PM Unk Pinto, MD [facility]  08/21/2022 10:00 AM Magda Bernheim, NP [facility]    History of Present Illness:     Patient is a very nice 80 yo MWF with HTN, HLD, Hypothyroidism, GERD, Vit D Def who presents with 2 weeks of head  /sinus congestion, fullness in ears, sore thtoat and dry cough. Denies fevers, chills, sweats rash, dyspnea.    Medications    levothyroxine  125 MCG tablet, TAKE 1 TABLET  DAILY    PROLIA 60 MG/ML  injec, Inject 60 mg into the skin every 6  months.    bisoprolol  5 MG tablet, TAKE 1/2 TABLET EVERY DAY   simvastatin  20 MG tablet, TAKE 1    cetirizine 10 MG tablet, Take 10 mg daily.   loratadine 10 MG tablet, Take dail;y   acetaminophen  500 MG tablet, Take  at bedtime as needed    VITAMIN C 1000 MG tablet, Take h daily.    Calcium Carbonate-Vitamin D 600-400 MG-UNIT tablet, Take 1 tablet  daily.   Cholecalciferol 2000 u, Take daily   Cinnamon 500 MG TABS, Take  daily.    Coenzyme Q10  200 MG CAPS, Take  daily.   Multiple Vitamin, Take 1 tablet daily.   Omega-3 FISH OIL 1200 MG CAPS, Take  daily.    pantoprazole  40 MG tablet, Take 1 tablet  2 x /day     Polyethyl Glycol-Propyl Glycol 0.4-0.3 % SOLN, Place 1 drop into both eyes at bedtime.   Probiotic , Take 1 capsule by mouth daily.   VENTYVIO IV, Inject into the vein.  Problem list She has Essential hypertension; Hyperlipidemia, mixed; Hypothyroid; GERD; Asthma; DJD (degenerative joint disease); Vitamin D deficiency; Osteoporosis with current pathological fracture; Allergy; Medication management; Colitis; Spondylolisthesis at L4-L5 level; Vertigo; Bilateral temporomandibular joint  pain; Senile purpura ([place]); Bilateral nephrolithiasis; Body mass index (BMI) of 19.0-19.9 in adult; Complete right bundle branch block (RBBB); and History of ulcerative colitis on their problem list.   Observations/Objective:  BP 128/78    Pulse 63    Temp 97.9 F (36.6 C)    Resp 16    Ht 5' 4"  (1.626 m)    Wt 108 lb 3.2 oz (49.1 kg)    SpO2 96%    BMI 18.57 kg/m   HEENT - EACs - Nl. TMs sl retracted . (+) tender Maxillary sinus areas . N/O/P - clear Neck - supple. No sig Cx LNs. Chest - few scattered rales.S. Cor - Nl HS. RRR w/o sig MGR. PP 1(+). No edema. MS- FROM w/o deformities.  Gait Nl. Neuro -  Nl w/o focal abnormalities.  Assessment and Plan:  1. Subacute maxillary sinusitis  - dexamethasone 2 MG tablet; Take 1 tab 3 x day - 3 days, then 2 x day - 3 days, then 1 tab daily   Dispense: 20 tablet  - pseudoephedrine  120 MG 12 hr tablet;   Take  1 tablet  2 x /day (every 12 hours)  Dispense: 60 tablet;  - azithromycin  250 MG tablet;  Take 2 tablets with Food on  Day 1, then 1 tablet Daily   Dispense: 6  each; Refill: 1  2. Bilateral acute serous otitis media  - dexamethasone  2 MG tablet;  Take 1 tab 3 x day - 3 days, then 2 x day - 3 days, then 1 tab daily   Dispense: 20 tablet  - pseudoephedrine  120 MG 12 hr tablet; Take  1 tablet  2 x /day (every 12 hours)  Dispense: 60 tablet  Follow Up Instructions:       I discussed the assessment and treatment plan with the patient. The patient was provided an opportunity to ask questions and all were answered. The patient agreed with the plan and demonstrated an understanding of the instructions.       The patient was advised to call back or seek an in-person evaluation if the symptoms worsen or if the condition fails to improve as anticipated.   Kirtland Bouchard, MD

## 2021-11-15 ENCOUNTER — Encounter: Payer: Self-pay | Admitting: Internal Medicine

## 2021-11-15 ENCOUNTER — Other Ambulatory Visit: Payer: Self-pay

## 2021-11-15 ENCOUNTER — Ambulatory Visit (INDEPENDENT_AMBULATORY_CARE_PROVIDER_SITE_OTHER): Payer: Medicare Other | Admitting: Internal Medicine

## 2021-11-15 VITALS — BP 128/78 | HR 63 | Temp 97.9°F | Resp 16 | Ht 64.0 in | Wt 108.2 lb

## 2021-11-15 DIAGNOSIS — J0100 Acute maxillary sinusitis, unspecified: Secondary | ICD-10-CM | POA: Diagnosis not present

## 2021-11-15 DIAGNOSIS — Z20822 Contact with and (suspected) exposure to covid-19: Secondary | ICD-10-CM | POA: Diagnosis not present

## 2021-11-15 DIAGNOSIS — H6503 Acute serous otitis media, bilateral: Secondary | ICD-10-CM | POA: Diagnosis not present

## 2021-11-15 MED ORDER — AZITHROMYCIN 250 MG PO TABS: ORAL_TABLET | ORAL | 1 refills | Status: DC

## 2021-11-15 MED ORDER — DEXAMETHASONE 2 MG PO TABS: ORAL_TABLET | ORAL | 0 refills | Status: DC

## 2021-11-15 MED ORDER — PSEUDOEPHEDRINE HCL ER 120 MG PO TB12: ORAL_TABLET | ORAL | 0 refills | Status: AC

## 2021-11-17 MED ORDER — PEG 3350-ELECTROLYTES 236 GRAM-22.74 GRAM-6.74 GRAM-5.86 GRAM SOLUTION
Freq: Once | ORAL | 0 refills | 1.00000 days | Status: CP
Start: 2021-11-17 — End: 2021-11-18

## 2021-11-19 DIAGNOSIS — A419 Sepsis, unspecified organism: Secondary | ICD-10-CM

## 2021-11-19 HISTORY — DX: Sepsis, unspecified organism: A41.9

## 2021-11-20 ENCOUNTER — Encounter: Payer: Self-pay | Admitting: Internal Medicine

## 2021-11-20 MED FILL — PEG 3350-ELECTROLYTES 236 GRAM-22.74 GRAM-6.74 GRAM-5.86 GRAM SOLUTION: ORAL | 1 days supply | Qty: 4000 | Fill #0

## 2021-11-20 NOTE — Progress Notes (Signed)
Future Appointments  Date Time Provider Department  11/21/2021  9:30 AM Unk Pinto, MD [facility]  12/13/2021 10:30 AM Newton Pigg, [facility]  02/08/2022 10:20 AM Ofilia Neas, PA-C [facility]  04/24/2022 10:30 AM Rex Kras, DO [facility]  05/15/2022  3:00 PM       CPE  Unk Pinto, MD [facility]  08/21/2022 10:00 AM       Wellness Magda Bernheim, NP [facility]    History of Present Illness:       [name] presents for 6 month follow up with HTN, HLD, Pre-Diabetes and Vitamin D Deficiency.  Patient is al so followed by Dr Watt Climes for GERD & Collagenous Colitis.       Patient is treated for HTN circa 2015 & BP has been controlled at home. Todays BP is at goal - 124/72. Patient has had no complaints of any cardiac type chest pain, palpitations, dyspnea / orthopnea / PND, dizziness, claudication, or dependent edema.       Hyperlipidemia is controlled with diet & Zocor Patient denies myalgias or other med SEs. Last Lipids were  Lab Results  Component Value Date   CHOL 158 05/15/2021   HDL 63 05/15/2021   LDLCALC 76 05/15/2021   TRIG 108 05/15/2021   CHOLHDL 2.5 05/15/2021     Also, the patient has history of PreDiabetes (A1c 5.7% /2019)  and has had no symptoms of reactive hypoglycemia, diabetic polys, paresthesias or visual blurring.  Last A1c was at goal :  Lab Results  Component Value Date   HGBA1C 5.5 05/15/2021        Further, the patient also has history of Vitamin D Deficiency and supplements vitamin D without any suspected side-effects. Last vitamin D was still low :   Lab Results  Component Value Date   VD25OH 47 11/23/2020     Current Outpatient Medications on File Prior to Visit  Medication Sig   acetaminophen (TYLENOL) 500 MG tablet Take 500 mg by mouth at bedtime as needed for moderate pain or headache.    Ascorbic Acid (VITAMIN C) 1000 MG tablet Take 1,000 mg by mouth daily.    aspirin EC 81 MG tablet Take  daily.     azithromycin (ZITHROMAX) 250 MG tablet Take 2 tablets with Food on  Day 1, then 1 tablet Daily with Food for Sinusitis / Bronchitis   bisoprolol (ZEBETA) 5 MG tablet TAKE 1/2 TABLET BY MOUTH EVERY DAY IN THE EVENING FOR BLOOD PRESSURE   Calcium Carbonate-Vitamin D 600-400 MG-UNIT tablet Take 1 tablet by mouth daily.   cetirizine (ZYRTEC) 10 MG tablet Take 10 mg by mouth daily.   Cholecalciferol 50 MCG (2000 UT) TABS Take by mouth.   Cinnamon 500 MG TABS Take 500 mg by mouth daily.    Coenzyme Q10 (COQ10) 200 MG CAPS Take 200 mg by mouth daily.   colestipol (COLESTID) 1 g tablet    dexamethasone  2 MG tablet Take 1 tab 3 x day - 3 days, then 2 x day - 3 days, then 1 tab daily   levothyroxine 125 MCG tablet TAKE 1 TABLET  DAILY    loratadine 10 MG tablet Take by mouth.   Multiple Vitamin (THERA VITAMIN ) Take 1 tablet by mouth daily.   Omega-3 Fatty Acids (FISH OIL) 1200 MG CAPS Take 1,200 mg by mouth daily.    pantoprazole  40 MG tablet Take 1 tablet  2 x /day  for Heartburn & Indigestion  Polyethyl Glycol-Propyl Glycol 0.4-0.3 % SOLN Place 1 drop into both eyes at bedtime.   Probiotic  Take 1 capsule by mouth daily.   PROLIA 60 MG/ML SOSY injection Inject 60 mg into the skin every 6 (six) months.   pseudoephedrine  120 MG 12 hr tablet Take  1 tablet  2 x /day (every 12 hours)  for Head and Chest Congestion   simvastatin  20 MG tablet TAKE 1 TABLET BY MOUTH AT BEDTIME FOR CHOLESTEROL   Vedolizumab (ENTYVIO IV) Inject into the vein.   No current facility-administered medications on file prior to visit.     Allergies  Allergen Reactions   Fosamax [Alendronate Sodium] Other (See Comments)    GI upset    Singulair [Montelukast Sodium] Other (See Comments)    Makes pt jittery   Wellbutrin [Bupropion] Hives   Clindamycin/Lincomycin Rash   Sulfa Antibiotics Rash     PMHx:   Past Medical History:  Diagnosis Date   Arthritis    Benign labile hypertension    Cancer (Ray)    skin  cancer on face   Colitis    DJD (degenerative joint disease)    Endometrial polyp    GERD (gastroesophageal reflux disease)    History of basal cell carcinoma excision    History of esophagitis    HOH (hard of hearing)    right ear better than left  -- wears no aides   Hyperlipidemia    Hypothyroidism    Osteopenia    Prediabetes    S/P right hip fracture 08/17/2020   Vitamin D deficiency    Wears glasses      Immunization History  Administered Date(s) Administered   Influenza, High Dose Seasonal PF 07/23/2014, 08/23/2015, 07/10/2016, 08/27/2017, 08/22/2018, 08/25/2019, 08/30/2020, 08/21/2021   PFIZER SARS-COV-2 Vaccination 12/10/2019, 12/31/2019, 08/16/2020, 04/14/2021   Pneumococcal -13 05/05/2015   Pneumococcal-23 08/22/2011   Tdap 08/22/2011   Zoster Recombinat (Shingrix) 09/13/2017, 01/01/2018   Zoster, Live 06/20/2006     Past Surgical History:  Procedure Laterality Date   BACK SURGERY  2020   CATARACT EXTRACTION, BILATERAL Bilateral 2020   DILATATION & CURETTAGE/HYSTEROSCOPY WITH MYOSURE N/A 10/27/2014   Procedure: DILATATION & CURETTAGE/HYSTEROSCOPY WITH MYOSURE;  Surgeon: Darlyn Chamber, MD;  Location: [facility];  Service: Gynecology;  Laterality: N/A;   DILATION AND CURETTAGE OF UTERUS     HIP SURGERY Right 04/2020   rod and screw in right hip    MOHS SURGERY  2005   LEFT NASAL BRIDGE FOR BASAL CELL   TONSILLECTOMY  age 68    FHx:    Reviewed / unchanged  SHx:    Reviewed / unchanged   Systems Review:  Constitutional: Denies fever, chills, wt changes, headaches, insomnia, fatigue, night sweats, change in appetite. Eyes: Denies redness, blurred vision, diplopia, discharge, itchy, watery eyes.  ENT: Denies discharge, congestion, post nasal drip, epistaxis, sore throat, earache, hearing loss, dental pain, tinnitus, vertigo, sinus pain, snoring.  CV: Denies chest pain, palpitations, irregular heartbeat, syncope, dyspnea, diaphoresis, orthopnea,  PND, claudication or edema. Respiratory: denies cough, dyspnea, DOE, pleurisy, hoarseness, laryngitis, wheezing.  Gastrointestinal: Denies dysphagia, odynophagia, heartburn, reflux, water brash, abdominal pain or cramps, nausea, vomiting, bloating, diarrhea, constipation, hematemesis, melena, hematochezia  or hemorrhoids. Genitourinary: Denies dysuria, frequency, urgency, nocturia, hesitancy, discharge, hematuria or flank pain. Musculoskeletal: Denies arthralgias, myalgias, stiffness, jt. swelling, pain, limping or strain/sprain.  Skin: Denies pruritus, rash, hives, warts, acne, eczema or change in skin lesion(s). Neuro: No weakness, tremor,  incoordination, spasms, paresthesia or pain. Psychiatric: Denies confusion, memory loss or sensory loss. Endo: Denies change in weight, skin or hair change.  Heme/Lymph: No excessive bleeding, bruising or enlarged lymph nodes.  Physical Exam  BP 124/72    Pulse 67    Temp 97.9 F (36.6 C)    Resp 16    Ht 5' 4"  (1.626 m)    Wt 109 lb 12.8 oz (49.8 kg)    SpO2 99%    BMI 18.85 kg/m   Appears  well nourished, well groomed  and in no distress.  Eyes: PERRLA, EOMs, conjunctiva no swelling or erythema. Sinuses: No frontal/maxillary tenderness ENT/Mouth: EAC's clear, TM's nl w/o erythema, bulging. Nares clear w/o erythema, swelling, exudates. Oropharynx clear without erythema or exudates. Oral hygiene is good. Tongue normal, non obstructing. Hearing intact.  Neck: Supple. Thyroid not palpable. Car 2+/2+ without bruits, nodes or JVD. Chest: Respirations nl with BS clear & equal w/o rales, rhonchi, wheezing or stridor.  Cor: Heart sounds normal w/ regular rate and rhythm without sig. murmurs, gallops, clicks or rubs. Peripheral pulses normal and equal  without edema.  Abdomen: Soft & bowel sounds normal. Non-tender w/o guarding, rebound, hernias, masses or organomegaly.  Lymphatics: Unremarkable.  Musculoskeletal: Full ROM all peripheral extremities, joint  stability, 5/5 strength and normal gait.  Skin: Warm, dry without exposed rashes, lesions or ecchymosis apparent.  Neuro: Cranial nerves intact, reflexes equal bilaterally. Sensory-motor testing grossly intact. Tendon reflexes grossly intact.  Pysch: Alert & oriented x 3.  Insight and judgement nl & appropriate. No ideations.  Assessment and Plan:  - Continue medication, monitor blood pressure at home.  - Continue DASH diet.  Reminder to go to the ER if any CP,  SOB, nausea, dizziness, severe HA, changes vision/speech.   1. Essential hypertension  - CBC with Differential/Platelet - COMPLETE METABOLIC PANEL WITH GFR - Magnesium - TSH  2. Hyperlipidemia, mixed  - Continue diet/meds, exercise,& lifestyle modifications.  - Continue monitor periodic cholesterol/liver & renal functions    - Lipid panel - TSH  3. Abnormal glucose  - Hemoglobin A1c - Insulin, random  4. Vitamin D deficiency  - Continue diet, exercise  - Lifestyle modifications.  - Monitor appropriate labs. - Continue supplementation. - VITAMIN D 25 Hydroxy   5. Medication management  - CBC with Differential/Platelet - COMPLETE METABOLIC PANEL WITH GFR - Magnesium - Lipid panel - TSH - Hemoglobin A1c - Insulin, random - VITAMIN D 25 Hydroxy          Discussed  regular exercise, BP monitoring, weight control to achieve/maintain BMI less than 25 and discussed med and SE's. Recommended labs to assess and monitor clinical status with further disposition pending results of labs.  I discussed the assessment and treatment plan with the patient. The patient was provided an opportunity to ask questions and all were answered. The patient agreed with the plan and demonstrated an understanding of the instructions.  I provided over 30 minutes of exam, counseling, chart review and  complex critical decision making.        The patient was advised to call back or seek an in-person evaluation if the symptoms worsen or if  the condition fails to improve as anticipated.   Kirtland Bouchard, MD

## 2021-11-20 NOTE — Patient Instructions (Signed)

## 2021-11-21 ENCOUNTER — Other Ambulatory Visit: Payer: Self-pay

## 2021-11-21 ENCOUNTER — Ambulatory Visit: Payer: Medicare Other | Admitting: Nurse Practitioner

## 2021-11-21 ENCOUNTER — Encounter: Payer: Self-pay | Admitting: Internal Medicine

## 2021-11-21 ENCOUNTER — Ambulatory Visit (INDEPENDENT_AMBULATORY_CARE_PROVIDER_SITE_OTHER): Payer: Medicare Other | Admitting: Internal Medicine

## 2021-11-21 VITALS — BP 124/72 | HR 67 | Temp 97.9°F | Resp 16 | Ht 64.0 in | Wt 109.8 lb

## 2021-11-21 DIAGNOSIS — E039 Hypothyroidism, unspecified: Secondary | ICD-10-CM | POA: Diagnosis not present

## 2021-11-21 DIAGNOSIS — E559 Vitamin D deficiency, unspecified: Secondary | ICD-10-CM | POA: Diagnosis not present

## 2021-11-21 DIAGNOSIS — R7309 Other abnormal glucose: Secondary | ICD-10-CM | POA: Diagnosis not present

## 2021-11-21 DIAGNOSIS — E782 Mixed hyperlipidemia: Secondary | ICD-10-CM

## 2021-11-21 DIAGNOSIS — Z79899 Other long term (current) drug therapy: Secondary | ICD-10-CM

## 2021-11-21 DIAGNOSIS — I10 Essential (primary) hypertension: Secondary | ICD-10-CM

## 2021-11-22 LAB — COMPLETE METABOLIC PANEL WITH GFR
AG Ratio: 2.1 (calc) (ref 1.0–2.5)
ALT: 16 U/L (ref 6–29)
AST: 21 U/L (ref 10–35)
Albumin: 4.2 g/dL (ref 3.6–5.1)
Alkaline phosphatase (APISO): 41 U/L (ref 37–153)
BUN: 10 mg/dL (ref 7–25)
CO2: 27 mmol/L (ref 20–32)
Calcium: 9.4 mg/dL (ref 8.6–10.4)
Chloride: 105 mmol/L (ref 98–110)
Creat: 0.78 mg/dL (ref 0.60–0.95)
Globulin: 2 g/dL (calc) (ref 1.9–3.7)
Glucose, Bld: 86 mg/dL (ref 65–99)
Potassium: 4.2 mmol/L (ref 3.5–5.3)
Sodium: 138 mmol/L (ref 135–146)
Total Bilirubin: 0.5 mg/dL (ref 0.2–1.2)
Total Protein: 6.2 g/dL (ref 6.1–8.1)
eGFR: 77 mL/min/{1.73_m2} (ref >=60)

## 2021-11-22 LAB — LIPID PANEL
Cholesterol: 163 mg/dL (ref <200)
HDL: 60 mg/dL (ref >=50)
LDL Cholesterol (Calc): 84 mg/dL (calc)
Non-HDL Cholesterol (Calc): 103 mg/dL (calc) (ref <130)
Total CHOL/HDL Ratio: 2.7 (calc) (ref <5.0)
Triglycerides: 95 mg/dL (ref <150)

## 2021-11-22 LAB — CBC WITH DIFFERENTIAL/PLATELET
Absolute Monocytes: 589 cells/uL (ref 200–950)
Basophils Absolute: 38 cells/uL (ref 0–200)
Basophils Relative: 0.7 %
Eosinophils Absolute: 119 cells/uL (ref 15–500)
Eosinophils Relative: 2.2 %
HCT: 39.7 % (ref 35.0–45.0)
Hemoglobin: 12.8 g/dL (ref 11.7–15.5)
Lymphs Abs: 1372 cells/uL (ref 850–3900)
MCH: 31.4 pg (ref 27.0–33.0)
MCHC: 32.2 g/dL (ref 32.0–36.0)
MCV: 97.3 fL (ref 80.0–100.0)
MPV: 10.7 fL (ref 7.5–12.5)
Monocytes Relative: 10.9 %
Neutro Abs: 3283 cells/uL (ref 1500–7800)
Neutrophils Relative %: 60.8 %
Platelets: 213 10*3/uL (ref 140–400)
RBC: 4.08 10*6/uL (ref 3.80–5.10)
RDW: 14.6 % (ref 11.0–15.0)
Total Lymphocyte: 25.4 %
WBC: 5.4 10*3/uL (ref 3.8–10.8)

## 2021-11-22 LAB — MAGNESIUM: Magnesium: 2 mg/dL (ref 1.5–2.5)

## 2021-11-22 LAB — HEMOGLOBIN A1C
Hgb A1c MFr Bld: 5.5 % of total Hgb (ref <5.7)
Mean Plasma Glucose: 111 mg/dL
eAG (mmol/L): 6.2 mmol/L

## 2021-11-22 LAB — VITAMIN D 25 HYDROXY (VIT D DEFICIENCY, FRACTURES): Vit D, 25-Hydroxy: 64 ng/mL (ref 30–100)

## 2021-11-22 LAB — INSULIN, RANDOM: Insulin: 2 u[IU]/mL

## 2021-11-22 LAB — TSH: TSH: 2.23 mIU/L (ref 0.40–4.50)

## 2021-11-22 NOTE — Progress Notes (Signed)
============================================================ °-   Test results slightly outside the reference range are not unusual. If there is anything important, I will review this with you,  otherwise it is considered normal test values.  If you have further questions,  please do not hesitate to contact me at the office or via My Chart.  ============================================================ ============================================================  -  Total Chol = 163   &   LDL Chol = Both  Excellent   - Very low risk for Heart Attack  / Stroke ============================================================ ============================================================  -  A1c - Normal - No Diabetes  - Great ! ============================================================ ============================================================  -  Vitamin D =- 64 - Excellent - Please keep dose same  ============================================================ ============================================================  -  All Else - CBC - Kidneys - Electrolytes - Liver - Magnesium & Thyroid    - all  Normal / OK ===========================================================  ===========================================================   - Keep up the [place] [employer]  !    ===========================================================  ===========================================================

## 2021-11-23 DIAGNOSIS — M25551 Pain in right hip: Secondary | ICD-10-CM | POA: Diagnosis not present

## 2021-12-01 DIAGNOSIS — Z20822 Contact with and (suspected) exposure to covid-19: Secondary | ICD-10-CM | POA: Diagnosis not present

## 2021-12-04 ENCOUNTER — Ambulatory Visit: Payer: Medicare Other

## 2021-12-04 ENCOUNTER — Other Ambulatory Visit: Payer: Self-pay

## 2021-12-04 DIAGNOSIS — R072 Precordial pain: Secondary | ICD-10-CM | POA: Diagnosis not present

## 2021-12-04 DIAGNOSIS — I4510 Unspecified right bundle-branch block: Secondary | ICD-10-CM | POA: Diagnosis not present

## 2021-12-04 DIAGNOSIS — Z01810 Encounter for preprocedural cardiovascular examination: Secondary | ICD-10-CM

## 2021-12-07 DIAGNOSIS — M25551 Pain in right hip: Secondary | ICD-10-CM | POA: Diagnosis not present

## 2021-12-11 ENCOUNTER — Telehealth: Payer: Self-pay

## 2021-12-11 NOTE — Telephone Encounter (Signed)
Patient returned call to confirm upcoming visit w/ CPP. Went over pre-call questions with patient.  Total time spent: 15 minutes Debbie Houston, [facility]

## 2021-12-13 ENCOUNTER — Other Ambulatory Visit: Payer: Self-pay

## 2021-12-13 ENCOUNTER — Ambulatory Visit: Payer: Medicare Other | Admitting: Pharmacist

## 2021-12-13 VITALS — BP 134/83 | HR 85

## 2021-12-13 DIAGNOSIS — E782 Mixed hyperlipidemia: Secondary | ICD-10-CM

## 2021-12-13 DIAGNOSIS — I10 Essential (primary) hypertension: Secondary | ICD-10-CM

## 2021-12-13 DIAGNOSIS — M8000XA Age-related osteoporosis with current pathological fracture, unspecified site, initial encounter for fracture: Secondary | ICD-10-CM

## 2021-12-13 DIAGNOSIS — K2100 Gastro-esophageal reflux disease with esophagitis, without bleeding: Secondary | ICD-10-CM

## 2021-12-13 DIAGNOSIS — Z79899 Other long term (current) drug therapy: Secondary | ICD-10-CM

## 2021-12-13 DIAGNOSIS — E039 Hypothyroidism, unspecified: Secondary | ICD-10-CM

## 2021-12-15 NOTE — Progress Notes (Signed)
Initial Pharmacist Visit   Debbie Houston  [id number] 81 years, Female  DOB: 1941-06-17  M: [phone] Care Team: Loree Fee    __________________________________________________ Clinical Summary Summary Situation:: Debbie Houston is a friendly 81 year old patient who presents for CCM initial visit. Pt is scheduled for a total hip conversion on 01/22/2022 which she is nervous about. Background:: Patient has a past medical history of HTN, Asthma, Gastroesophageal Reflux Disease, Hypothyroidism, Osteoporosis, HLD, senile purpura, Complete right bundle branch block, Colitis, DJD,  Vitamin D deficiency, Spondylolisthesis, Bilateral nephrolithiasis. Patient unfortunately is not drinking enough water and enjoys drinking sweet tea. Patient has not been exercising much due to hip pain, and noticed a slight increase in her blood pressure in the 130s/70-80s Assessment:: Despite slight elevations in BP, Patients blood pressure is currently controlled at &lt;140/80 mmHg. Pt does have GERD symptoms daily which she states are manageable. Pt trying to avoid trigger foods. Recommendations:: Encouraged patient to continue checking BP daily Encouraged patient to start drinking at least 64 ounces of water daily Pt would like to receive Pneumovax 23 vaccine at next visit [place]:: CCM Services:  This encounter meets complex CCM services and moderate to high medical decision making.  Prior to outreach and patient consent for Chronic Care Management, I referred this patient for services after reviewing the nominated patient list or from a personal encounter with the patient.  I have personally reviewed this encounter including the documentation in this note and have collaborated with the care management provider regarding care management and care coordination activities to include development and update of the comprehensive care plan I am certifying that I agree with  the content of this note and encounter as supervising physician.   Visit Details Patient scheduled for CCM visit with the clinical pharmacist.  Patient is referred for CCM by their PCP and CPP is under general PCP supervision.: At least 2 of these conditions are expected to last 12 months or longer and patient is at significant risk for acute exacerbations and/or functional decline.  Patient has consented to participation in [place] program. Visit Type: Phone Call Date of Upcoming Visit: 12/13/2021  Patient Chart Prep [facility])  Chronic Conditions Patient's Chronic Conditions: Hypertension (HTN), Asthma, Gastroesophageal Reflux Disease (GERD), Hypothyroidism, Osteopenia or Osteoporosis, Hyperlipidemia/Dyslipidemia (HLD), Senile purpura, Complete right bundle branch block, Colitis, DJD,  Vitamin D deficiency, Spondylolisthesis, Bilateral nephrolithiasis  Doctor and Hospital Visits Were there PCP Visits in last 6 months?: Yes Visit #1: 06/30/21-Dana Demetra Shiner, NP (PCP)-Pt. presented for acute visit c/o headache and dizziness.  STARTED  prednisone 20 MG 1 pill 3 x a day for 3 days, 1 pill 2 x a day x 3 days, 1 pill a day x 5 days with food   STOPPED  Dexamethasone 4 MG Take 1 tab 3 x day - 3 days, then 2 x day - 3 days, then 1 tab daily (Completed Course) Meloxicam 15 MG Take   1/2 to 1 tablet  Daily with Food for Pain &amp; Inflammation (Completed Course)  Visit #2: 07/06/21-Dana Mull, NP-Pt. presented for f/u of vasculitis. No medication changes. Visit #3: 07/18/21-Dr. McKeown-Pt. presented for complicated hx/o recently suspected vasculitis with elevated hsCRP.  STARTED  Meloxicam 15 MG Take  1/2 to 1 tablet  Daily  with Food  for Pain &amp; Inflammation  Visit #4: 07/28/21-Dr. McKeown-Pt. presented for10 day f/u of treating musculoskeletal neck pain and bilateral  Hip trochanteric Bursitis with Meloxicam.  She relates  intolerance to Meloxicam with diarrhea , so she only took one  dose.  STARTED  tizanidine HCl 4 MG Take  1/2 to 1 tablet  3 x /day  as needed for Muscle Spasm &amp; Pain   STOPPED  Losartan Potassium 50 MG TAKE 1/2 TABLET IN THE EVENING FOR BLOOD PRESSURE GOAL&lt;140/80. IF GREATER THAN THIS IN TWO WEEKS, INCREASE TO 1 TABLET DAILY  Visit #5: 08/15/21-Dr. McKeown-Pt. presented with c/o sinus congestion &amp; pressure for about 2 weeks.  STARTED  Cephalexin 500 MG Take 1 capsule 4 x /day with Meals &amp; Bedtime for Infection Dexamethasone 4 MG Take 1 tab 3 x day - 3 days, then 2 x day - 3 days, then 1 tab daily  Visit #6: 08/21/21-Dana Mull, NP-Pt. presented for AWV.  STOPPED  Dexamethasone 4 MG Take 1 tab 3 x day - 3 days, then 2 x day - 3 days, then 1 tab daily tizanidine HCl 4 MG Take  1/2 to 1 tablet  3 x /day  as needed for Muscle Spasm &amp; Pain  Additional Visits: 09/20/21-Ashley Corbett, NP-Pt. presented for a Bartholin's cyst.  STOPPED  Cephalexin 500 MG Take 1 capsule 4 x /day with Meals &amp; Bedtime for Infection   10/09/21-Dana Mull, NP- Pt. presented for drug eruption from clindamycin.  STARTED  Dexamethasone 1 MG Take 3 tabs for 3 days, 2 tabs for 3 days 1 tab for 5 days. Take with food   STOPPED  Clindamycin HCl 300 mg Oral 4 times daily   11/15/21-Dr. McKeown-Pt. presented w/ 2 weeks of head/sinus congestion, fullness in ears, sore throat, and dry cough.  STARTED  Azithromycin 250 MG Take 2 tablets with Food on  Day 1, then 1 tablet Daily with Food for Sinusitis / Bronchitis Dexamethasone 2 MG Take 1 tab 3 x day - 3 days, then 2 x day - 3 days, then 1 tab daily Pseudoephedrine HCl 120 MG Take  1 tablet  2 x /day (every 12 hours)  for Head and Chest Congestion   11/21/21-Dr. McKeown-Pt. presented for 6 month f/u. No medication changes noted. Were there Specialist Visits in last 6 months?: Yes Visit #1: 06/19/21-Olivia Conception Oms, MSW LCSW ([facility])-No information available. Visit  #2: 07/10/21-DeveshwarAbel Presto, MD (Rheumatology)-Pt. was seen for rash. No medication changes noted. Visit #3: 07/11/21-Millie Dascomb Long, MD (Gastroenterology)-Pt was seen for microscopic colitis. No medication changes noted. Visit #4: 07/27/21-Feng, Krista Blue, MD (Oncology)-Pt. presented for f/u for Abnormal SPEP.  STOPPED  Meloxicam 15 MG Take  1/2 to 1 tablet  Daily  with Food  for Pain &amp; Inflammation  Visit #5: 08/02/21-Eichman MD, Brien Mates (Neurosurgery)-No information available. Visit #6: 08/10/21-DeveshwarAbel Presto, MD (Rheumatology)-Pt. presented for rash. No medication changes noted. Additional Visits: 08/28/21-Schmitz, Enid Baas, MD (Sports Medicine)-Pt. presented with a nonunion of the right femur fracture. No medication changes noted.  09/17/21-Kavan, Tula Nakayama, PA-C (Urgent care)-Pt. presented for UTI symptoms and Bartholin cyst.  Will treat with oral antibiotics, Doxy and Augmentin as patient has sulfa allergy. No dosage given on RXs that were prescribed.  09/21/21-Lavoie, Truddie Hidden, MD (Obstetrics and Gynecology)-Pt. presented w/ left vulvar abscess.  STARTED  Clindamycin HCl 300 mg Oral 4 times daily   09/29/21-Lavoie, Marie-Lyne, MD (Obstetrics and Gynecology) F/U Vulvar abscess  STARTED  Terconazole 0.8 % 1 applicator Vaginal Daily at bedtime   10/03/21-Eichman MD, Brien Mates (Neurosurgery)-No information available.  10/06/21-Lavoie, Truddie Hidden, MD (Obstetrics and Gynecology)-F/U Vulvar Abscess. No medication.  10/25/21-Tolia, Sunit, DO (Cardiology)-Pt. presented for  pre-op clearance exam.  STOPPED  Dexamethasone 1 MG Take 3 tabs for 3 days, 2 tabs for 3 days 1 tab for 5 days. Take with food. Dicyclomine HCl 20 MG Take   1 tablet  3 x /day  before Meals   Was there a Hospital Visit in last 30 days?: No Were there other Hospital Visits in last 6 months?: Yes Visit #1: 08/06/21-Pt. presented for endorsed pain to the left side of her nose with  associated swelling.  She also endorsed some mild congestion, follows with body aches, subjective fever, and now having some abdominal discomfort.  Abdominal pain is described as a achy sensation mild to moderate in severity.  No medication changes noted.  Medication Information Are there any Medication discrepancies?: Yes Details:  Levothyroxine 151mg-09/05/21 (&gt;5 days gap) 90DS by [facility] Bisoprolol 590m09/21/22 (&gt;5 days gap) 30DS by [facility] Simvastatin 204m6/06/22 (&gt;5 days gap) 90DS by [facility] Pantoprazole 39m45m/20/21 (&gt;5 days gap) 90DS by [facility]  Are there any Medication adherence gaps (beyond 5 days past due)?: Yes Details:  Levothyroxine 125mc52m/18/22 (&gt;5 days gap) 90DS by [facility] Bisoprolol 5mg-01m1/22 (&gt;5 days gap) 30DS by [facility] Simvastatin 20mg-024m/22 (&gt;5 days gap) 90DS by [facility] Pantoprazole 39mg-0463m21 (&gt;5 days gap) 90DS by [facility]  Medication adherence rates for the STAR rating drugs:  Bisoprolol 5mg-09/223m2 30DS Simvastatin 20mg-06/066m 90DS  List Patient's current Care Gaps: No current Care Gaps identified   Pre-Call Questions (HC) [facility] Yes Visit Type: Phone Call May we confirm what is the best phone # for the pharmacist to call you?: [phone] date/time: 12/13/21 at 10:30AM Have you seen any other providers since your last visit?: No Any changes in your medicines or health?: No Any side effects from any medications?: No Do you have any symptoms or problems not managed by your medications?: No What are your top 2 health concerns right now?:  Pt. Stated "Getting a new hip" she is having surgery on right hip on march 6th Numbness  What are your top 2 medication concerns right now?: Other Details: No medication concerns at this time Has your Dr asked that you take BP, BG or special diet at home?: Other Details: Pt. stated "no" Do you get any type of exercise  on a regular basis?: No What are your top 1 or 2 goals for your health?:  Be able to walk w/o pain  What are you doing to try to reach these goals?: Taking medications What, if any, problems do you have getting your medications from the pharmacy?: None Is there anything else that you would like to discuss during the appointment?: No Disease Assessments  Subjective Information Current BP: 124/72 Current HR: 67 taken on: 11/21/2021 Previous BP: 128/78 Previous HR: 63 taken on: 11/15/2021 Weight: 109 BMI: 18.85 Last GFR: 77 taken on: 11/21/2021 Why did the patient present?: CCM initial visit Marital status?: Married Details: Thomas WalAzra Abrell level?: College grEmerson ElectricPrevious work?: Pt is now retired. She was a kindergartOncologistars What does the patient do during the day?: Pt mostly does housework, and goes to grocery store. Pt is limited due to hip pain; pt used to participate in water aerobics and walking. frustrating. It has made it difficult to walk. Pt has upcoming hip surgery 01/22/22 and is nervous. Who does the patient spend their time with and what do they do?: Spends time with Husband. Sister is a retired nurse and Marine scientists time with her. She keeps in touch  with previous coworkers and high-school friends Lifestyle habits such as diet and exercise?: Diet: Breakfast: Honey nut cheerios, Lunch: yogurt or sandwich. and a wholesome dinner. Coffee: couple of cups in the morning and a decaf at night Water: Not much. drinks a lot of sweet tea Alcohol, tobacco, and illicit drug usage?: None What is the patient's sleep pattern?: Trouble staying asleep, Other (with free form text) Details: Uses bathroom 3-4 times a night.  How many hours per night does patient typically sleep?: 8-9hours Patient pleased with health care they are receiving?: Yes Family, occupational, and living circumstances relevant to overall health?: Her family history includes Autism in her  son; Autism spectrum disorder in her son; Cancer in her father, maternal grandmother, mother, and sister; Colitis in her son; Colon cancer (age of onset: 48) in her brother; Diabetes in her brother and maternal grandfather; Healthy in her son; Hyperlipidemia in her brother, father, maternal grandfather, maternal grandmother, mother, paternal grandfather, paternal grandmother, and sister; Hypertension in her brother and mother; Stroke in her maternal grandfather. Name and location of Current pharmacy: [facility] [place], [address] AT [place] Current Rx insurance plan: UHC Are meds synced by current pharmacy?: No Are meds delivered by current pharmacy?: No - delivery available but patient prefers to not use Would patient benefit from direct intervention of clinical lead in dispensing process to optimize clinical outcomes?: Yes Are [facility] pharmacy services available where patient lives?: Yes Is patient disadvantaged to use [facility]?: No [facility] services reviewed with patient and patient wishes to change pharmacy?: No Select reason patient declined to change pharmacies: Prefers to go to the pharmacy / reason to leave the house Does patient experience delays in picking up medications due to transportation concerns (getting to pharmacy)?: No Any additional demeanor/mood notes?: Donnie Gedeon is a friendly 81 year old patient who presents for CCM initial visit. Pt is scheduled for a total hip conversion on 2/3 which she is nervous about.  Hypertension (HTN) Assess this condition today?: Yes Is patient able to obtain BP reading today?: Yes BP today is: 134/83 Heart Rate is: 85 Goal: <140/90 mmHG Hypertension Stage: Stage 1 (SBP: 130-139 or DBP: 80-89) Patient has tried and failed: Hip pain: Is Patient checking BP at home?: Yes Pt. home BP readings are ranging: 120-130s/80s How often does patient miss taking their blood  pressure medications?: Never Has patient experienced hypotension, dizziness, falls or bradycardia?: Yes Provide Details: Occasional dizziness BP RPM device: Does patient qualify?: Yes We discussed: DASH diet:  following a diet emphasizing fruits and vegetables and low-fat dairy products along with whole grains, fish, poultry, and nuts. Reducing red meats and sugars., Proper Home BP Measurement Assessment:: Controlled Drug: bisoprolol (ZEBETA) 5 MG tablet TAKE 1/2 TABLET BY MOUTH EVERY DAY IN THE EVENING FOR BLOOD PRESSURE Assessment: Appropriate, Effective, Safe, Accessible Additional Info: Pt concerned about higher BP readings which could be due to increasing hip pain. HC Follow up: N/A Pharmacist Follow up: Monitor blood pressure daily, monitor heart rate  Hyperlipidemia/Dyslipidemia (HLD) Last Lipid panel on: 11/21/2021 TC (Goal<200): 163 LDL: 84 HDL (Goal>40): 60 TG (Goal<150): 95 ASCVD 10-year risk?is:: N/A due to Age > 79 Assess this condition today?: Yes LDL Goal: <100 Has patient tried and failed any HLD Medications?: No Check present secondary causes (below) that can lead to increased cholesterol levels (multi-choice optional): Beta blockers, Hypothyroidism We discussed: How to reduce cholesterol through diet/weight management and physical  activity., Encouraged increasing fiber to a daily intake of 10-25g/day Assessment:: Controlled Drug: Simvastatin 76m QHS Assessment: Appropriate, Effective, Safe, Accessible Drug: Colestipol 1g take 4 tabs daily in the afternoon Assessment: Appropriate, Effective, Safe, Accessible HC Follow up: N/A Pharmacist Follow up: assess lipid panel, LFTs, s/s rhabdo, and GI symptoms  GERD Assess this condition today?: Yes How frequently do you have symptoms of GERD or reflux?: daily Does patient experience difficulty or pain with swallowing?: No What OTC medications have you tried and what was the response?: Pepcid - not effective Sucralfate -  Not effective Is the patient a smoker or exposed to second-hand smoke?: No How long have you taken prescription medications for GERD?: Protonix since 2018 What potential triggering foods have you identified and tried to limit intake in your diet?: spicy foods Have these changes been successful in reducing reflux symptoms?: No Details: Pt has been unable to avoid spicy foods. We discussed: Identifying and avoiding / limiting trigger foods, Limiting caffeine intake, Remaining upright for 2-3 hours after eating and avoiding eating for 2-3 hours before bedtime, Eating smaller, more frequent meals Assessment:: Controlled Drug: pantoprazole (PROTONIX) 40 MG tablet Take 1 tablet 2 x /day for Heartburn & Indigestion Assessment: Appropriate, Effective, Safe, Accessible HC Follow up: N/A Pharmacist Follow up: Continue to avoid food triggers, monitor GERD symptoms  Osteopenia or Osteoporosis Current T-score: -2.5 taken on: 12/26/2020 Current Vitamin D 25-OH: 64 taken on: 12/11/2021 Previous Vitamin D 25-OH: 47 taken on: 11/23/2020 Assess this condition today?: Yes Patient has: Osteoporosis In the past 12 months, have you fallen?: No Is there adequate lighting in your home to reduce the risk of falls?: Yes Dietary calcium intake: Supplementation Risk Factors: PPI use We discussed: Weight bearing exercises (walking, light weights, resistance training), Dietary calcium intake, Fall prevention Assessment:: Controlled Drug: Prolia 666mevery 6 months Assessment: Appropriate, Effective, Safe, Accessible HC Follow up: N/A Pharmacist Follow up: assess fall risk, DEXA due 12/2022  Hypothyroidism Current TSH: 2.23 taken on: 11/21/2021 Previous TSH: 5.97 taken on: 08/17/2021 Current T4: 1.4 taken on: 10/22/2019 Current T3: 62 taken on: 10/22/2019 Assess this condition today?: Yes We discussed: Proper administration of levothyroxine (empty stomach, 30 min before breakfast and with no other meds /  vitamins) Assessment:: Controlled Drug: levothyroxine (SYNTHROID) 125 MCG tablet TAKE 1 TABLET BY MOUTH DAILY ON AN EMPTY STOMACH  Assessment: Appropriate, Effective, Safe, Accessible HC Follow up: N/A Pharmacist Follow up: Assess TSH level, assess for symptoms of hypo/hyperthyroidism  Exercise, Diet and Non-Drug Coordination Needs Additional exercise counseling points. We discussed: incorporating flexibility, balance, and strength training exercises Additional diet counseling points. We discussed: aiming to consume at least 8 cups of water day, limiting caffeine intake Discussed Non-Drug Care Coordination Needs: Yes Does Patient have Medication financial barriers?: No  Accountable Health Communities Health-Related Social Needs Screening Tool -  SDOH  ([email or web address] is your living situation today? (ref #1): I have a steady place to live Think about the place you live. Do you have problems with any of the following? (ref #2): None of the above Within the past 12 months, you worried that your food would run out before you got money to buy more (ref #3): Never true Within the past 12 months, the food you bought just didn't last and you didn't have money to get more (ref #4): Never true In the past 12 months, has lack of reliable transportation kept you from medical appointments, meetings, work or from getting things needed for daily living? (ref #  5): No In the past 12 months, has the electric, gas, oil, or water company threatened to shut off services in your home? (ref #6): No How often does anyone, including family and friends, physically hurt you? (ref #7): Never (1) How often does anyone, including family and friends, insult or talk down to you? (ref #8): Never (1) How often does anyone, including friends and family, threaten you with harm? (ref #9): Never (1) How often does anyone, including family and friends, scream or curse  at you? (ref #10): Never (1) Engagement Notes Newton Pigg on 12/13/2021 12:41 PM HC Chart/ CP prep: 88 Minutes-LM-12/11/21 CPP Chart Review: 26 min CPP Office Visit: 37 min CPP Office Visit Documentation: 60 min CPP Coordination of Care: [facility] Care Plan Completion: CPP Care Plan Review: 17 min   Engagement Notes Newton Pigg on 12/13/2021 12:38 PM CPP F/u: 05/15/22: Ok Edwards w McKeown 06/05/22 10:30am: CCM follow up office visit (assess thyroid, how did hip surgery go and how is pain? BP, GERD)  Care Gaps: Pneumonia vaccine: Pt to receive at next visit COVID booster: Pt educated               Rachelle Hora. Jeannett Senior, PharmD  Clinical Pharmacist  [email or web address]  [phone]

## 2021-12-17 NOTE — Progress Notes (Signed)
Future Appointments  Date Time Provider Department  12/18/2021  3:30 PM Unk Pinto, MD [facility]  01/09/2022  9:00 AM [facility] PAT 1 [facility]  02/08/2022 10:20 AM Ofilia Neas, PA-C [facility]  04/24/2022 10:30 AM Rex Kras, DO [facility]  05/15/2022  3:00 PM Unk Pinto, MD [facility]  06/05/2022 10:30 AM Newton Pigg, [facility]  08/21/2022 10:00 AM Magda Bernheim, NP [facility]    History of Present Illness:                                         [name]  with HTN, HLD, GERD/Collagenous Colitis,  Pre-Diabetes and Vitamin D Deficiency  presents with c/o Right ear pains.     Medications    levothyroxine 125 MCG tablet, TAKE 1 TABLET  DAILY    PROLIA 60 MG/ML SOSY inj.  Inject 60 mg into the skin every 6 months.   bisoprolol 5 MG tablet, TAKE 1/2 TABLET EVERY DAY   simvastatin 20 MG tablet, TAKE 1 TABLET every other day   cetirizine  10 MG tablet, Take  at bedtime.   loratadine  10 MG tablet, Take daily. Alternate between Zyrtec   pseudoephedrine 120 MG 12 hr tablet, Take  1 tablet  2 x /day  Current Outpatient Medications (Analgesics):    acetaminophen 500 MG tablet, Take 500 mg  at bedtime as needed for moderate pain    aspirin EC 81 MG tablet, Take  at bedtime.    VITAMIN C 1000 MG tablet, Take  daily.    Calcium Carbonate-Vitamin D 600-400 MG-UNIT tablet, Take 1 tablet  daily.   Cholecalciferol 50 MCG (2000 UT) TABS, Take 1,000 Units in the morning and at bedtime.   Cinnamon 500 MG TABS, Take daily.    Coenzyme Q10 200 MG CAPS, Take daily.   Multiple Vitamin,Take 1 tablet daily.   Omega-3 Fatty Acids 1200 MG CAPS, Take 1,200 mg daily.    pantoprazole 40 MG tablet, Take 1 tablet  2 x /day     Polyethyl Glycol-Propyl Glycol 0.4-0.3 % SOLN, Place 1 drop into both eyes at bedtime.   PROBIOTIC , Take 1 capsule  daily.   ENTYVIO IV, Inject into the vein every 6 (six) months.  Problem list She has Essential hypertension; Hyperlipidemia, mixed;  Hypothyroid; GERD; Asthma; DJD (degenerative joint disease); Vitamin D deficiency; Osteoporosis with current pathological fracture; Allergy; Medication management; Colitis; Spondylolisthesis at L4-L5 level; Vertigo; Bilateral temporomandibular joint pain; Senile purpura ([place]); Bilateral nephrolithiasis; Body mass index (BMI) of 19.0-19.9 in adult; Complete right bundle branch block (RBBB); and History of ulcerative colitis on their problem list.   Observations/Objective:  BP 120/78    Pulse 69    Temp 97.9 F (36.6 C)    Resp 16    Ht 5' 4"  (1.626 m)    Wt 112 lb (50.8 kg)    SpO2 96%    BMI 19.22 kg/m   HEENT -  TMjts - exquisitely tender.  EACs patent . TMs sl retracted. N/O/P clear  Neck - supple.  Chest - Clear equal BS. Cor - Nl HS. RRR w/o sig MGR. PP 1(+). No edema. MS- FROM w/o deformities.  Gait Nl. Neuro -  Nl w/o focal abnormalities.   Assessment and Plan:  1. Bilateral chronic serous otitis media   2. Bilateral temporomandibular joint pain  - dexamethasone  2 MG tablet;  Take 1 tab 3 x day for 3 days, then 2 x day for  3 days,  then 1 tab daily for 5 days then 1 tablet every other day  \ Dispense: 35 tablet; Refill: 0  Follow Up Instructions:       I discussed the assessment and treatment plan with the patient. The patient was provided an opportunity to ask questions and all were answered. The patient agreed with the plan and demonstrated an understanding of the instructions.       The patient was advised to call back or seek an in-person evaluation if the symptoms worsen or if the condition fails to improve as anticipated.    Kirtland Bouchard, MD

## 2021-12-18 ENCOUNTER — Ambulatory Visit (INDEPENDENT_AMBULATORY_CARE_PROVIDER_SITE_OTHER): Payer: Medicare Other | Admitting: Internal Medicine

## 2021-12-18 ENCOUNTER — Other Ambulatory Visit: Payer: Self-pay | Admitting: Nurse Practitioner

## 2021-12-18 ENCOUNTER — Encounter: Payer: Self-pay | Admitting: Internal Medicine

## 2021-12-18 ENCOUNTER — Other Ambulatory Visit: Payer: Self-pay

## 2021-12-18 VITALS — BP 120/78 | HR 69 | Temp 97.9°F | Resp 16 | Ht 64.0 in | Wt 112.0 lb

## 2021-12-18 DIAGNOSIS — M26623 Arthralgia of bilateral temporomandibular joint: Secondary | ICD-10-CM

## 2021-12-18 DIAGNOSIS — E039 Hypothyroidism, unspecified: Secondary | ICD-10-CM | POA: Diagnosis not present

## 2021-12-18 DIAGNOSIS — H6523 Chronic serous otitis media, bilateral: Secondary | ICD-10-CM | POA: Diagnosis not present

## 2021-12-18 DIAGNOSIS — E782 Mixed hyperlipidemia: Secondary | ICD-10-CM | POA: Diagnosis not present

## 2021-12-18 DIAGNOSIS — K2100 Gastro-esophageal reflux disease with esophagitis, without bleeding: Secondary | ICD-10-CM | POA: Diagnosis not present

## 2021-12-18 DIAGNOSIS — I10 Essential (primary) hypertension: Secondary | ICD-10-CM | POA: Diagnosis not present

## 2021-12-18 MED ORDER — DEXAMETHASONE 2 MG PO TABS: ORAL_TABLET | ORAL | 0 refills | Status: DC

## 2021-12-25 ENCOUNTER — Telehealth: Payer: Self-pay | Admitting: Family Medicine

## 2021-12-25 NOTE — Telephone Encounter (Signed)
Forwarding msg to med asst to re-verify pt's Medicare Benefits for [employer], she has decided DOES want to take it.--Advised pt we need to chk her benefits & see if Pre-auth req'd -- her appt is scheduled for  2/14 unless rescheduling needed.  --glh

## 2021-12-25 NOTE — Telephone Encounter (Signed)
Per Theadora Rama: Patient is ready for scheduling and has no OOP cost for [employer]. She is scheduled for nurse visit on 01/02/22.

## 2021-12-25 NOTE — Telephone Encounter (Signed)
Patient is ready for scheduling and has no OOP cost for [employer]. She is scheduled for nurse visit on 01/02/22.

## 2021-12-26 ENCOUNTER — Ambulatory Visit: Admit: 2021-12-26 | Discharge: 2021-12-26 | Payer: MEDICARE | Attending: Internal Medicine | Primary: Internal Medicine

## 2021-12-26 DIAGNOSIS — K529 Noninfective gastroenteritis and colitis, unspecified: Principal | ICD-10-CM

## 2021-12-26 DIAGNOSIS — Z681 Body mass index (BMI) 19 or less, adult: Secondary | ICD-10-CM | POA: Diagnosis not present

## 2021-12-27 DIAGNOSIS — E673 Hypervitaminosis D: Secondary | ICD-10-CM | POA: Diagnosis not present

## 2021-12-27 DIAGNOSIS — K51919 Ulcerative colitis, unspecified with unspecified complications: Secondary | ICD-10-CM | POA: Diagnosis not present

## 2021-12-28 DIAGNOSIS — J3489 Other specified disorders of nose and nasal sinuses: Secondary | ICD-10-CM | POA: Diagnosis not present

## 2021-12-28 DIAGNOSIS — H903 Sensorineural hearing loss, bilateral: Secondary | ICD-10-CM | POA: Diagnosis not present

## 2021-12-28 DIAGNOSIS — Z87891 Personal history of nicotine dependence: Secondary | ICD-10-CM | POA: Diagnosis not present

## 2021-12-28 DIAGNOSIS — Z974 Presence of external hearing-aid: Secondary | ICD-10-CM | POA: Diagnosis not present

## 2021-12-28 DIAGNOSIS — J383 Other diseases of vocal cords: Secondary | ICD-10-CM | POA: Diagnosis not present

## 2021-12-28 DIAGNOSIS — H6983 Other specified disorders of Eustachian tube, bilateral: Secondary | ICD-10-CM | POA: Diagnosis not present

## 2021-12-28 DIAGNOSIS — H7402 Tympanosclerosis, left ear: Secondary | ICD-10-CM | POA: Diagnosis not present

## 2022-01-02 ENCOUNTER — Ambulatory Visit (INDEPENDENT_AMBULATORY_CARE_PROVIDER_SITE_OTHER): Payer: Medicare Other | Admitting: *Deleted

## 2022-01-02 DIAGNOSIS — M8000XA Age-related osteoporosis with current pathological fracture, unspecified site, initial encounter for fracture: Secondary | ICD-10-CM | POA: Diagnosis not present

## 2022-01-02 NOTE — Progress Notes (Signed)
Patient is here for nurse visit for her 1st Evenity injection. Patient received bilateral [place] arm injections. She waited in the office 15 minutes. She tolerated injections well. She will return in 1 month for her next Evenity injection.

## 2022-01-02 NOTE — Progress Notes (Signed)
Sent message, via epic in basket, requesting orders in epic from surgeon.  

## 2022-01-04 ENCOUNTER — Ambulatory Visit: Payer: Self-pay | Admitting: Physician Assistant

## 2022-01-04 DIAGNOSIS — M25551 Pain in right hip: Secondary | ICD-10-CM | POA: Diagnosis not present

## 2022-01-04 DIAGNOSIS — S72141K Displaced intertrochanteric fracture of right femur, subsequent encounter for closed fracture with nonunion: Secondary | ICD-10-CM

## 2022-01-04 NOTE — H&P (Signed)
TOTAL HIP ADMISSION H&P  Patient is admitted for right total hip arthroplasty.  Subjective:  Chief Complaint: right hip pain  HPI: Debbie Houston, 81 y.o. female, has a history of pain and functional disability in the right hip(s) due to trauma and patient has failed non-surgical conservative treatments for greater than 12 weeks to include NSAID's and/or analgesics, use of assistive devices, activity modification, and bone stimulator .  Onset of symptoms was gradual starting 2 years ago with gradually worsening course since that time.The patient noted prior procedures of the hip to include right hip femoral nail  on the right hip(s).  Patient currently rates pain in the right hip at 8 out of 10 with activity. Patient has night pain, worsening of pain with activity and weight bearing, pain that interfers with activities of daily living, and pain with passive range of motion. Patient has evidence of joint space narrowing and nonunion right intertrochanteric hip fracture  by imaging studies. This condition presents safety issues increasing the risk of falls. This patient has had  nonunion right intertrochanteric hip fracture .  There is no current active infection.  Patient Active Problem List   Diagnosis Date Noted   History of ulcerative colitis 07/10/2021   Complete right bundle branch block (RBBB) 05/15/2021   Body mass index (BMI) of 19.0-19.9 in adult 05/12/2021   Bilateral nephrolithiasis 09/21/2020   Senile purpura ([place]) 08/17/2020   Bilateral temporomandibular joint pain 06/15/2020   Vertigo 03/25/2019   Spondylolisthesis at L4-L5 level 11/25/2018   Colitis 12/21/2014   Medication management 04/20/2014   Essential hypertension    Hyperlipidemia, mixed    Hypothyroid    GERD    Asthma    DJD (degenerative joint disease)    Vitamin D deficiency    Osteoporosis with current pathological fracture    Allergy    Past Medical History:  Diagnosis Date   Arthritis    Benign labile  hypertension    Cancer ([place])    skin cancer on face   Colitis    DJD (degenerative joint disease)    Endometrial polyp    GERD (gastroesophageal reflux disease)    History of basal cell carcinoma excision    History of esophagitis    HOH (hard of hearing)    right ear better than left  -- wears no aides   Hyperlipidemia    Hypothyroidism    Osteopenia    Prediabetes    S/P right hip fracture 08/17/2020   Vitamin D deficiency    Wears glasses     Past Surgical History:  Procedure Laterality Date   BACK SURGERY  2020   CATARACT EXTRACTION, BILATERAL Bilateral 2020   DILATATION & CURETTAGE/HYSTEROSCOPY WITH MYOSURE N/A 10/27/2014   Procedure: DILATATION & CURETTAGE/HYSTEROSCOPY WITH MYOSURE;  Surgeon: Darlyn Chamber, MD;  Location: [facility];  Service: Gynecology;  Laterality: N/A;   DILATION AND CURETTAGE OF UTERUS     HIP SURGERY Right 04/2020   rod and screw in right hip    MOHS SURGERY  2005   LEFT NASAL BRIDGE FOR BASAL CELL   TONSILLECTOMY  age 17    Current Outpatient Medications  Medication Sig Dispense Refill Last Dose   acetaminophen (TYLENOL) 500 MG tablet Take 500 mg by mouth at bedtime as needed for moderate pain or headache.       Ascorbic Acid (VITAMIN C) 1000 MG tablet Take 1,000 mg by mouth daily.       aspirin EC  81 MG tablet Take 81 mg by mouth at bedtime.      bisoprolol (ZEBETA) 5 MG tablet TAKE 1/2 TABLET BY MOUTH EVERY DAY IN THE EVENING FOR BLOOD PRESSURE 45 tablet 3    Calcium Carbonate-Vitamin D 600-400 MG-UNIT tablet Take 1 tablet by mouth daily.      cetirizine (ZYRTEC) 10 MG tablet Take 10 mg by mouth at bedtime.      Cholecalciferol 50 MCG (2000 UT) TABS Take 1,000 Units by mouth in the morning and at bedtime.      Cinnamon 500 MG TABS Take 500 mg by mouth daily.       Coenzyme Q10 (COQ10) 200 MG CAPS Take 200 mg by mouth daily.      colestipol (COLESTID) 1 g tablet Take 4 g by mouth daily.      dexamethasone (DECADRON) 2 MG tablet  Take 1 tab 3 x day for 3 days, then 2 x day for  3 days, then 1 tab daily for 5 days then 1 tablet every other day 35 tablet 0    levothyroxine (SYNTHROID) 125 MCG tablet TAKE 1 TABLET BY MOUTH DAILY ON AN EMPTY STOMACH WITH ONLY WATER FOR 30 MINUTES(NO ANTACIDS MEDICATIONS, CALCIUM OR MAGNESIUM FOR 4 HOURS) 90 tablet 0    loratadine (CLARITIN) 10 MG tablet Take 10 mg by mouth daily. Alternate between Zyrtec      Multiple Vitamin (THERA VITAMIN PO) Take 1 tablet by mouth daily.      Omega-3 Fatty Acids (FISH OIL) 1200 MG CAPS Take 1,200 mg by mouth daily.       pantoprazole (PROTONIX) 40 MG tablet Take 1 tablet  2 x /day  for Heartburn & Indigestion 180 tablet 0    Polyethyl Glycol-Propyl Glycol 0.4-0.3 % SOLN Place 1 drop into both eyes at bedtime.      Probiotic Product (PROBIOTIC PO) Take 1 capsule by mouth daily.      PROLIA 60 MG/ML SOSY injection Inject 60 mg into the skin every 6 (six) months.      pseudoephedrine (SUDAFED) 120 MG 12 hr tablet Take  1 tablet  2 x /day (every 12 hours)  for Head and Chest Congestion 60 tablet 0    simvastatin (ZOCOR) 20 MG tablet TAKE 1 TABLET BY MOUTH AT BEDTIME FOR CHOLESTEROL (Patient taking differently: Take 20 mg by mouth every other day. TAKE 1 TABLET BY MOUTH AT BEDTIME FOR CHOLESTEROL) 90 tablet 1    Vedolizumab (ENTYVIO IV) Inject into the vein every 6 (six) months.      No current facility-administered medications for this visit.   Allergies  Allergen Reactions   Fosamax [Alendronate Sodium] Other (See Comments)    GI upset    Singulair [Montelukast Sodium] Other (See Comments)    Makes pt jittery   Wellbutrin [Bupropion] Hives   Clindamycin/Lincomycin Rash   Sulfa Antibiotics Rash    Social History   Tobacco Use   Smoking status: Former    Packs/day: 0.25    Years: 15.00    Pack years: 3.75    Types: Cigarettes    Quit date: 2002    Years since quitting: 21.1   Smokeless tobacco: Never   Tobacco comments:    Former light smoker,  quit 15+ years ago, can't recall  Substance Use Topics   Alcohol use: Not Currently    Family History  Problem Relation Age of Onset   Hypertension Mother    Cancer Mother  COLON   Hyperlipidemia Mother    Cancer Father        BREAST WITH BRAIN METS   Hyperlipidemia Father    Cancer Sister        COLON (TEENS), BREAST (72)   Hyperlipidemia Sister    Diabetes Brother    Hyperlipidemia Brother    Hypertension Brother    Colon cancer Brother 9   Cancer Maternal Grandmother        RENAL   Hyperlipidemia Maternal Grandmother    Diabetes Maternal Grandfather    Stroke Maternal Grandfather    Hyperlipidemia Maternal Grandfather    Hyperlipidemia Paternal Grandmother    Hyperlipidemia Paternal Grandfather    Autism Son    Colitis Son    Autism spectrum disorder Son    Healthy Son      Review of Systems  Constitutional:  Positive for activity change.  HENT:  Positive for hearing loss.   Musculoskeletal:  Positive for arthralgias.  All other systems reviewed and are negative.  Objective:  Physical Exam Constitutional:      General: She is not in acute distress.    Appearance: Normal appearance.  HENT:     Head: Normocephalic and atraumatic.  Eyes:     Extraocular Movements: Extraocular movements intact.     Pupils: Pupils are equal, round, and reactive to light.  Cardiovascular:     Rate and Rhythm: Normal rate and regular rhythm.     Pulses: Normal pulses.     Heart sounds: Murmur heard.  Pulmonary:     Effort: Pulmonary effort is normal. No respiratory distress.     Breath sounds: Normal breath sounds.  Abdominal:     General: Abdomen is flat. Bowel sounds are normal. There is no distension.     Palpations: Abdomen is soft.     Tenderness: There is no abdominal tenderness.  Musculoskeletal:     Cervical back: Normal range of motion and neck supple.     Comments: On examination she ambulates with a mildly antalgic gait. Incisions are well healed. She  tolerates the range of motion, flexion to 90 degrees, internal rotation to 0 degrees  , external rotation to 30 degrees. Mild pain throughout range of motion. Painless knee range of motion. No knee or ankle effusion.   Skin:    General: Skin is warm and dry.     Findings: No erythema or rash.  Neurological:     General: No focal deficit present.     Mental Status: She is alert and oriented to person, place, and time.  Psychiatric:        Mood and Affect: Mood normal.        Behavior: Behavior normal.    Vital signs in last 24 hours: @VSRANGES @  Labs:   Estimated body mass index is 19.22 kg/m as calculated from the following:   Height as of 12/18/21: 5' 4"  (1.626 m).   Weight as of 12/18/21: 50.8 kg.   Imaging Review Plain radiographs demonstrate  nonunion right intertrochanteric hip fracture  degenerative joint disease of the right hip(s). The bone quality appears to be good for age and reported activity level.      Assessment/Plan:  End stage arthritis, right hip(s)  The patient history, physical examination, clinical judgement of the provider and imaging studies are consistent with end stage degenerative joint disease of the right hip(s) and total hip arthroplasty is deemed medically necessary. The treatment options including medical management, injection therapy, arthroscopy and arthroplasty were  discussed at length. The risks and benefits of total hip arthroplasty were presented and reviewed. The risks due to aseptic loosening, infection, stiffness, dislocation/subluxation,  thromboembolic complications and other imponderables were discussed.  The patient acknowledged the explanation, agreed to proceed with the plan and consent was signed. Patient is being admitted for inpatient treatment for surgery, pain control, PT, OT, prophylactic antibiotics, VTE prophylaxis, progressive ambulation and ADL's and discharge planning.The patient is planning to be discharged home with home  health services   Anticipated LOS equal to or greater than 2 midnights due to - Age 48 and older with one or more of the following:  - Obesity  - Expected need for hospital services (PT, OT, Nursing) required for safe  discharge  - Anticipated need for postoperative skilled nursing care or inpatient rehab  - diabetes OR   - Unanticipated findings during/Post Surgery: None  - Patient is a high risk of re-admission due to: None

## 2022-01-04 NOTE — H&P (View-Only) (Signed)
TOTAL HIP ADMISSION H&P  Patient is admitted for right total hip arthroplasty.  Subjective:  Chief Complaint: right hip pain  HPI: Debbie Houston, 81 y.o. female, has a history of pain and functional disability in the right hip(s) due to trauma and patient has failed non-surgical conservative treatments for greater than 12 weeks to include NSAID's and/or analgesics, use of assistive devices, activity modification, and bone stimulator .  Onset of symptoms was gradual starting 2 years ago with gradually worsening course since that time.The patient noted prior procedures of the hip to include right hip femoral nail  on the right hip(s).  Patient currently rates pain in the right hip at 8 out of 10 with activity. Patient has night pain, worsening of pain with activity and weight bearing, pain that interfers with activities of daily living, and pain with passive range of motion. Patient has evidence of joint space narrowing and nonunion right intertrochanteric hip fracture  by imaging studies. This condition presents safety issues increasing the risk of falls. This patient has had  nonunion right intertrochanteric hip fracture .  There is no current active infection.  Patient Active Problem List   Diagnosis Date Noted   History of ulcerative colitis 07/10/2021   Complete right bundle branch block (RBBB) 05/15/2021   Body mass index (BMI) of 19.0-19.9 in adult 05/12/2021   Bilateral nephrolithiasis 09/21/2020   Senile purpura ([place]) 08/17/2020   Bilateral temporomandibular joint pain 06/15/2020   Vertigo 03/25/2019   Spondylolisthesis at L4-L5 level 11/25/2018   Colitis 12/21/2014   Medication management 04/20/2014   Essential hypertension    Hyperlipidemia, mixed    Hypothyroid    GERD    Asthma    DJD (degenerative joint disease)    Vitamin D deficiency    Osteoporosis with current pathological fracture    Allergy    Past Medical History:  Diagnosis Date   Arthritis    Benign labile  hypertension    Cancer ([place])    skin cancer on face   Colitis    DJD (degenerative joint disease)    Endometrial polyp    GERD (gastroesophageal reflux disease)    History of basal cell carcinoma excision    History of esophagitis    HOH (hard of hearing)    right ear better than left  -- wears no aides   Hyperlipidemia    Hypothyroidism    Osteopenia    Prediabetes    S/P right hip fracture 08/17/2020   Vitamin D deficiency    Wears glasses     Past Surgical History:  Procedure Laterality Date   BACK SURGERY  2020   CATARACT EXTRACTION, BILATERAL Bilateral 2020   DILATATION & CURETTAGE/HYSTEROSCOPY WITH MYOSURE N/A 10/27/2014   Procedure: DILATATION & CURETTAGE/HYSTEROSCOPY WITH MYOSURE;  Surgeon: Darlyn Chamber, MD;  Location: [facility];  Service: Gynecology;  Laterality: N/A;   DILATION AND CURETTAGE OF UTERUS     HIP SURGERY Right 04/2020   rod and screw in right hip    MOHS SURGERY  2005   LEFT NASAL BRIDGE FOR BASAL CELL   TONSILLECTOMY  age 72    Current Outpatient Medications  Medication Sig Dispense Refill Last Dose   acetaminophen (TYLENOL) 500 MG tablet Take 500 mg by mouth at bedtime as needed for moderate pain or headache.       Ascorbic Acid (VITAMIN C) 1000 MG tablet Take 1,000 mg by mouth daily.       aspirin EC  81 MG tablet Take 81 mg by mouth at bedtime.      bisoprolol (ZEBETA) 5 MG tablet TAKE 1/2 TABLET BY MOUTH EVERY DAY IN THE EVENING FOR BLOOD PRESSURE 45 tablet 3    Calcium Carbonate-Vitamin D 600-400 MG-UNIT tablet Take 1 tablet by mouth daily.      cetirizine (ZYRTEC) 10 MG tablet Take 10 mg by mouth at bedtime.      Cholecalciferol 50 MCG (2000 UT) TABS Take 1,000 Units by mouth in the morning and at bedtime.      Cinnamon 500 MG TABS Take 500 mg by mouth daily.       Coenzyme Q10 (COQ10) 200 MG CAPS Take 200 mg by mouth daily.      colestipol (COLESTID) 1 g tablet Take 4 g by mouth daily.      dexamethasone (DECADRON) 2 MG tablet  Take 1 tab 3 x day for 3 days, then 2 x day for  3 days, then 1 tab daily for 5 days then 1 tablet every other day 35 tablet 0    levothyroxine (SYNTHROID) 125 MCG tablet TAKE 1 TABLET BY MOUTH DAILY ON AN EMPTY STOMACH WITH ONLY WATER FOR 30 MINUTES(NO ANTACIDS MEDICATIONS, CALCIUM OR MAGNESIUM FOR 4 HOURS) 90 tablet 0    loratadine (CLARITIN) 10 MG tablet Take 10 mg by mouth daily. Alternate between Zyrtec      Multiple Vitamin (THERA VITAMIN PO) Take 1 tablet by mouth daily.      Omega-3 Fatty Acids (FISH OIL) 1200 MG CAPS Take 1,200 mg by mouth daily.       pantoprazole (PROTONIX) 40 MG tablet Take 1 tablet  2 x /day  for Heartburn & Indigestion 180 tablet 0    Polyethyl Glycol-Propyl Glycol 0.4-0.3 % SOLN Place 1 drop into both eyes at bedtime.      Probiotic Product (PROBIOTIC PO) Take 1 capsule by mouth daily.      PROLIA 60 MG/ML SOSY injection Inject 60 mg into the skin every 6 (six) months.      pseudoephedrine (SUDAFED) 120 MG 12 hr tablet Take  1 tablet  2 x /day (every 12 hours)  for Head and Chest Congestion 60 tablet 0    simvastatin (ZOCOR) 20 MG tablet TAKE 1 TABLET BY MOUTH AT BEDTIME FOR CHOLESTEROL (Patient taking differently: Take 20 mg by mouth every other day. TAKE 1 TABLET BY MOUTH AT BEDTIME FOR CHOLESTEROL) 90 tablet 1    Vedolizumab (ENTYVIO IV) Inject into the vein every 6 (six) months.      No current facility-administered medications for this visit.   Allergies  Allergen Reactions   Fosamax [Alendronate Sodium] Other (See Comments)    GI upset    Singulair [Montelukast Sodium] Other (See Comments)    Makes pt jittery   Wellbutrin [Bupropion] Hives   Clindamycin/Lincomycin Rash   Sulfa Antibiotics Rash    Social History   Tobacco Use   Smoking status: Former    Packs/day: 0.25    Years: 15.00    Pack years: 3.75    Types: Cigarettes    Quit date: 2002    Years since quitting: 21.1   Smokeless tobacco: Never   Tobacco comments:    Former light smoker,  quit 15+ years ago, can't recall  Substance Use Topics   Alcohol use: Not Currently    Family History  Problem Relation Age of Onset   Hypertension Mother    Cancer Mother  COLON   Hyperlipidemia Mother    Cancer Father        BREAST WITH BRAIN METS   Hyperlipidemia Father    Cancer Sister        COLON (TEENS), BREAST (53)   Hyperlipidemia Sister    Diabetes Brother    Hyperlipidemia Brother    Hypertension Brother    Colon cancer Brother 61   Cancer Maternal Grandmother        RENAL   Hyperlipidemia Maternal Grandmother    Diabetes Maternal Grandfather    Stroke Maternal Grandfather    Hyperlipidemia Maternal Grandfather    Hyperlipidemia Paternal Grandmother    Hyperlipidemia Paternal Grandfather    Autism Son    Colitis Son    Autism spectrum disorder Son    Healthy Son      Review of Systems  Constitutional:  Positive for activity change.  HENT:  Positive for hearing loss.   Musculoskeletal:  Positive for arthralgias.  All other systems reviewed and are negative.  Objective:  Physical Exam Constitutional:      General: She is not in acute distress.    Appearance: Normal appearance.  HENT:     Head: Normocephalic and atraumatic.  Eyes:     Extraocular Movements: Extraocular movements intact.     Pupils: Pupils are equal, round, and reactive to light.  Cardiovascular:     Rate and Rhythm: Normal rate and regular rhythm.     Pulses: Normal pulses.     Heart sounds: Murmur heard.  Pulmonary:     Effort: Pulmonary effort is normal. No respiratory distress.     Breath sounds: Normal breath sounds.  Abdominal:     General: Abdomen is flat. Bowel sounds are normal. There is no distension.     Palpations: Abdomen is soft.     Tenderness: There is no abdominal tenderness.  Musculoskeletal:     Cervical back: Normal range of motion and neck supple.     Comments: On examination she ambulates with a mildly antalgic gait. Incisions are well healed. She  tolerates the range of motion, flexion to 90 degrees, internal rotation to 0 degrees  , external rotation to 30 degrees. Mild pain throughout range of motion. Painless knee range of motion. No knee or ankle effusion.   Skin:    General: Skin is warm and dry.     Findings: No erythema or rash.  Neurological:     General: No focal deficit present.     Mental Status: She is alert and oriented to person, place, and time.  Psychiatric:        Mood and Affect: Mood normal.        Behavior: Behavior normal.    Vital signs in last 24 hours: @VSRANGES @  Labs:   Estimated body mass index is 19.22 kg/m as calculated from the following:   Height as of 12/18/21: 5' 4"  (1.626 m).   Weight as of 12/18/21: 50.8 kg.   Imaging Review Plain radiographs demonstrate  nonunion right intertrochanteric hip fracture  degenerative joint disease of the right hip(s). The bone quality appears to be good for age and reported activity level.      Assessment/Plan:  End stage arthritis, right hip(s)  The patient history, physical examination, clinical judgement of the provider and imaging studies are consistent with end stage degenerative joint disease of the right hip(s) and total hip arthroplasty is deemed medically necessary. The treatment options including medical management, injection therapy, arthroscopy and arthroplasty were  discussed at length. The risks and benefits of total hip arthroplasty were presented and reviewed. The risks due to aseptic loosening, infection, stiffness, dislocation/subluxation,  thromboembolic complications and other imponderables were discussed.  The patient acknowledged the explanation, agreed to proceed with the plan and consent was signed. Patient is being admitted for inpatient treatment for surgery, pain control, PT, OT, prophylactic antibiotics, VTE prophylaxis, progressive ambulation and ADL's and discharge planning.The patient is planning to be discharged home with home  health services   Anticipated LOS equal to or greater than 2 midnights due to - Age 69 and older with one or more of the following:  - Obesity  - Expected need for hospital services (PT, OT, Nursing) required for safe  discharge  - Anticipated need for postoperative skilled nursing care or inpatient rehab  - diabetes OR   - Unanticipated findings during/Post Surgery: None  - Patient is a high risk of re-admission due to: None

## 2022-01-08 DIAGNOSIS — H16223 Keratoconjunctivitis sicca, not specified as Sjogren's, bilateral: Secondary | ICD-10-CM | POA: Diagnosis not present

## 2022-01-08 DIAGNOSIS — H52223 Regular astigmatism, bilateral: Secondary | ICD-10-CM | POA: Diagnosis not present

## 2022-01-08 DIAGNOSIS — H16143 Punctate keratitis, bilateral: Secondary | ICD-10-CM | POA: Diagnosis not present

## 2022-01-08 DIAGNOSIS — H5203 Hypermetropia, bilateral: Secondary | ICD-10-CM | POA: Diagnosis not present

## 2022-01-08 DIAGNOSIS — H524 Presbyopia: Secondary | ICD-10-CM | POA: Diagnosis not present

## 2022-01-08 NOTE — Telephone Encounter (Addendum)
Evenity Injection Schedule Inj #1 - 01/02/22 Inj #2 - 02/14/22 Inj #3 - 03/21/22 Inj #4 - 04/23/22 Inj #5 - due 05/24/22 Inj #6  Inj #7  Inj #8  Inj #9  Inj #10  Inj #11  Inj #12

## 2022-01-08 NOTE — Patient Instructions (Addendum)
DUE TO COVID-19 ONLY ONE VISITOR IS ALLOWED TO COME WITH YOU AND STAY IN THE WAITING ROOM ONLY DURING PRE OP AND PROCEDURE DAY OF SURGERY IF YOU ARE GOING HOME AFTER SURGERY. IF YOU ARE SPENDING THE NIGHT 2 PEOPLE MAY VISIT WITH YOU IN YOUR PRIVATE ROOM AFTER SURGERY UNTIL VISITING  HOURS ARE OVER AT 800 PM AND 1  VISITOR  MAY  SPEND THE NIGHT.   YOU NEED TO HAVE A COVID 19 TEST ON__3/2_____ @_8 :45______, THIS TEST MUST BE DONE BEFORE SURGERY,                Debbie Houston     Your procedure is scheduled on: 01/22/22   Report to [facility] Main  Entrance   Report to short stay at 5:15 AM     Call this number if you have problems the morning of surgery [phone]   No food after midnight.    You may have clear liquid until 4:30 AM.    At 4:15 AM drink pre surgery drink.   Nothing by mouth after 4:30 AM.     CLEAR LIQUID DIET   Foods Allowed                                                                     Foods Excluded  Coffee and tea, regular and decaf                             liquids that you cannot  Plain Jell-O any favor except red or purple                                           see through such as: Fruit ices (not with fruit pulp)                                     milk, soups, orange juice  Iced Popsicles                                    All solid food Carbonated beverages, regular and diet                                    Cranberry, grape and apple juices Sports drinks like Gatorade Lightly seasoned clear broth or consume(fat free) Sugar    BRUSH YOUR TEETH MORNING OF SURGERY AND RINSE YOUR MOUTH OUT, NO CHEWING GUM CANDY OR MINTS.     Take these medicines the morning of surgery with A SIP OF WATER: Synthroid, Pantoprazole  Stop taking _ASA 81 mg__________on __________as instructed by _____________.  Stop taking ____________as directed by your Surgeon/Cardiologist.  Contact your Surgeon/Cardiologist for instructions on Anticoagulant  Therapy prior to surgery.  You may not have any metal on your body including hair pins and              piercings  Do not wear jewelry, make-up, lotions, powders or perfumes, deodorant             Do not wear nail polish on your fingernails.  Do not shave  48 hours prior to surgery.               Do not bring valuables to the hospital. [place].  Contacts, dentures or bridgework may not be worn into surgery.  Leave suitcase in the car. After surgery it may be brought to your room.              [facility] - Preparing for Surgery Before surgery, you can play an important role.  Because skin is not sterile, your skin needs to be as free of germs as possible.  You can reduce the number of germs on your skin by washing with CHG (chlorahexidine gluconate) soap before surgery.  CHG is an antiseptic cleaner which kills germs and bonds with the skin to continue killing germs even after washing. Please DO NOT use if you have an allergy to CHG or antibacterial soaps.  If your skin becomes reddened/irritated stop using the CHG and inform your nurse when you arrive at Short Stay. Do not shave (including legs and underarms) for at least 48 hours prior to the first CHG shower.   Please follow these instructions carefully:  1.  Shower with CHG Soap the night before surgery and the  morning of Surgery.  2.  If you choose to wash your hair, wash your hair first as usual with your  normal  shampoo.  3.  After you shampoo, rinse your hair and body thoroughly to remove the  shampoo.                            4.  Use CHG as you would any other liquid soap.  You can apply chg directly  to the skin and wash                       Gently with a scrungie or clean washcloth.  5.  Apply the CHG Soap to your body ONLY FROM THE NECK DOWN.   Do not use on face/ open                           Wound or open sores. Avoid contact with eyes,  ears mouth and genitals (private parts).                       Wash face,  Genitals (private parts) with your normal soap.             6.  Wash thoroughly, paying special attention to the area where your surgery  will be performed.  7.  Thoroughly rinse your body with warm water from the neck down.  8.  DO NOT shower/wash with your normal soap after using and rinsing off  the CHG Soap.                9.  Pat yourself dry with a clean towel.  10.  Wear clean pajamas.            11.  Place clean sheets on your bed the night of your first shower and do not  sleep with pets. Day of Surgery : Do not apply any lotions/deodorants the morning of surgery.  Please wear clean clothes to the hospital/surgery center.  FAILURE TO FOLLOW THESE INSTRUCTIONS MAY RESULT IN THE CANCELLATION OF YOUR SURGERY   ________________________________________________________________________   Incentive Spirometer  An incentive spirometer is a tool that can help keep your lungs clear and active. This tool measures how well you are filling your lungs with each breath. Taking long deep breaths may help reverse or decrease the chance of developing breathing (pulmonary) problems (especially infection) following: A long period of time when you are unable to move or be active. BEFORE THE PROCEDURE  If the spirometer includes an indicator to show your best effort, your nurse or respiratory therapist will set it to a desired goal. If possible, sit up straight or lean slightly forward. Try not to slouch. Hold the incentive spirometer in an upright position. INSTRUCTIONS FOR USE  Sit on the edge of your bed if possible, or sit up as far as you can in bed or on a chair. Hold the incentive spirometer in an upright position. Breathe out normally. Place the mouthpiece in your mouth and seal your lips tightly around it. Breathe in slowly and as deeply as possible, raising the piston or the ball toward the top of the  column. Hold your breath for 3-5 seconds or for as long as possible. Allow the piston or ball to fall to the bottom of the column. Remove the mouthpiece from your mouth and breathe out normally. Rest for a few seconds and repeat Steps 1 through 7 at least 10 times every 1-2 hours when you are awake. Take your time and take a few normal breaths between deep breaths. The spirometer may include an indicator to show your best effort. Use the indicator as a goal to work toward during each repetition. After each set of 10 deep breaths, practice coughing to be sure your lungs are clear. If you have an incision (the cut made at the time of surgery), support your incision when coughing by placing a pillow or rolled up towels firmly against it. Once you are able to get out of bed, walk around indoors and cough well. You may stop using the incentive spirometer when instructed by your caregiver.  RISKS AND COMPLICATIONS Take your time so you do not get dizzy or light-headed. If you are in pain, you may need to take or ask for pain medication before doing incentive spirometry. It is harder to take a deep breath if you are having pain. AFTER USE Rest and breathe slowly and easily. It can be helpful to keep track of a log of your progress. Your caregiver can provide you with a simple table to help with this. If you are using the spirometer at home, follow these instructions: [place] IF:  You are having difficultly using the spirometer. You have trouble using the spirometer as often as instructed. Your pain medication is not giving enough relief while using the spirometer. You develop fever of 100.5 F (38.1 C) or higher. SEEK IMMEDIATE MEDICAL CARE IF:  You cough up bloody sputum that had not been present before. You develop fever of 102 F (38.9 C) or greater. You develop worsening pain at or near the incision site. MAKE  SURE YOU:  Understand these instructions. Will watch your  condition. Will get help right away if you are not doing well or get worse. Document Released: 03/18/2007 Document Revised: 01/28/2012 Document Reviewed: 05/19/2007 [facility] Patient Information 2014 [place], [place].   ________________________________________________________________________

## 2022-01-09 ENCOUNTER — Encounter (HOSPITAL_COMMUNITY)
Admission: RE | Admit: 2022-01-09 | Discharge: 2022-01-09 | Disposition: A | Discharge status: Home / Self Care | Payer: Medicare Other | Source: Ambulatory Visit | Attending: Orthopedic Surgery | Admitting: Orthopedic Surgery

## 2022-01-09 ENCOUNTER — Other Ambulatory Visit: Payer: Self-pay

## 2022-01-09 ENCOUNTER — Encounter (HOSPITAL_COMMUNITY): Payer: Self-pay

## 2022-01-09 VITALS — BP 143/92 | HR 71 | Temp 98.6°F | Resp 18 | Ht 65.0 in | Wt 110.0 lb

## 2022-01-09 DIAGNOSIS — Y939 Activity, unspecified: Secondary | ICD-10-CM | POA: Diagnosis not present

## 2022-01-09 DIAGNOSIS — Z01812 Encounter for preprocedural laboratory examination: Secondary | ICD-10-CM | POA: Diagnosis not present

## 2022-01-09 DIAGNOSIS — S72141K Displaced intertrochanteric fracture of right femur, subsequent encounter for closed fracture with nonunion: Secondary | ICD-10-CM | POA: Diagnosis not present

## 2022-01-09 DIAGNOSIS — Z01818 Encounter for other preprocedural examination: Secondary | ICD-10-CM

## 2022-01-09 HISTORY — DX: Myoneural disorder, unspecified: G70.9

## 2022-01-09 LAB — TYPE AND SCREEN
ABO/RH(D): A POS
Antibody Screen: NEGATIVE

## 2022-01-09 LAB — CBC WITH DIFFERENTIAL/PLATELET
Abs Immature Granulocytes: 0.07 10*3/uL (ref 0.00–0.07)
Basophils Absolute: 0.1 10*3/uL (ref 0.0–0.1)
Basophils Relative: 1 %
Eosinophils Absolute: 0.2 10*3/uL (ref 0.0–0.5)
Eosinophils Relative: 4 %
HCT: 40.9 % (ref 36.0–46.0)
Hemoglobin: 13.2 g/dL (ref 12.0–15.0)
Immature Granulocytes: 1 %
Lymphocytes Relative: 24 %
Lymphs Abs: 1.6 10*3/uL (ref 0.7–4.0)
MCH: 32.4 pg (ref 26.0–34.0)
MCHC: 32.3 g/dL (ref 30.0–36.0)
MCV: 100.2 fL — ABNORMAL HIGH (ref 80.0–100.0)
Monocytes Absolute: 0.7 10*3/uL (ref 0.1–1.0)
Monocytes Relative: 10 %
Neutro Abs: 4.1 10*3/uL (ref 1.7–7.7)
Neutrophils Relative %: 60 %
Platelets: 182 10*3/uL (ref 150–400)
RBC: 4.08 MIL/uL (ref 3.87–5.11)
RDW: 14.8 % (ref 11.5–15.5)
WBC: 6.7 10*3/uL (ref 4.0–10.5)
nRBC: 0 % (ref 0.0–0.2)

## 2022-01-09 LAB — SURGICAL PCR SCREEN
MRSA, PCR: NEGATIVE
Staphylococcus aureus: NEGATIVE

## 2022-01-09 LAB — COMPREHENSIVE METABOLIC PANEL
ALT: 18 U/L (ref 0–44)
AST: 20 U/L (ref 15–41)
Albumin: 3.6 g/dL (ref 3.5–5.0)
Alkaline Phosphatase: 31 U/L — ABNORMAL LOW (ref 38–126)
Anion gap: 6 (ref 5–15)
BUN: 16 mg/dL (ref 8–23)
CO2: 26 mmol/L (ref 22–32)
Calcium: 8.8 mg/dL — ABNORMAL LOW (ref 8.9–10.3)
Chloride: 103 mmol/L (ref 98–111)
Creatinine, Ser: 0.87 mg/dL (ref 0.44–1.00)
GFR, Estimated: >60 mL/min (ref >60)
Glucose, Bld: 81 mg/dL (ref 70–99)
Potassium: 4.4 mmol/L (ref 3.5–5.1)
Sodium: 135 mmol/L (ref 135–145)
Total Bilirubin: 0.5 mg/dL (ref 0.3–1.2)
Total Protein: 6 g/dL — ABNORMAL LOW (ref 6.5–8.1)

## 2022-01-09 NOTE — Progress Notes (Signed)
COVID test- 01/18/22 at 8:45 am  Bowel prep reminder:NA  PCP -Dr. Magnus Sinning  Cardiologist -   Chest x-ray - 08/06/21-epic EKG - 10/25/21-epic Stress Test - 12/04/21-epic ECHO - 11/04/21-epic Cardiac Cath - no Pacemaker/ICD device last checked:NA  Sleep Study - no CPAP -   Fasting Blood Sugar - NA Checks Blood Sugar _____ times a day  Blood Thinner Instructions:ASA 81/ Dr. Melford Aase Aspirin Instructions:Stop 7 days prior to DOS/ Dr. Zachery Dakins Last Dose:01/14/22  Anesthesia review: yes  Patient denies shortness of breath, fever, cough and chest pain at PAT appointment Pt has no SOB with any activities.  Patient verbalized understanding of instructions that were given to them at the PAT appointment. Patient was also instructed that they will need to review over the PAT instructions again at home before surgery. yes

## 2022-01-10 ENCOUNTER — Other Ambulatory Visit (HOSPITAL_COMMUNITY): Payer: Self-pay

## 2022-01-11 DIAGNOSIS — Z1231 Encounter for screening mammogram for malignant neoplasm of breast: Secondary | ICD-10-CM | POA: Diagnosis not present

## 2022-01-11 LAB — HM MAMMOGRAPHY

## 2022-01-11 NOTE — Anesthesia Preprocedure Evaluation (Addendum)
Anesthesia Evaluation  ?Patient identified by MRN, date of birth, ID band ?Patient awake ? ? ? ?Reviewed: ?Allergy & Precautions, NPO status , Patient's Chart, lab work & pertinent test results, reviewed documented beta blocker date and time  ? ?History of Anesthesia Complications ?Negative for: history of anesthetic complications ? ?Airway ?Mallampati: II ? ?TM Distance: >3 FB ?Neck ROM: Full ? ? ? Dental ? ?(+) Missing,  ?  ?Pulmonary ?asthma , former smoker,  ?  ?Pulmonary exam normal ? ? ? ? ? ? ? Cardiovascular ?hypertension, Pt. on medications and Pt. on home beta blockers ?+ Peripheral Vascular Disease  ?Normal cardiovascular exam ? ?RBBB ?  ?Neuro/Psych ?negative neurological ROS ? negative psych ROS  ? GI/Hepatic ?Neg liver ROS, GERD  Medicated and Controlled,  ?Endo/Other  ?Hypothyroidism  ? Renal/GU ?negative Renal ROS  ?negative genitourinary ?  ?Musculoskeletal ? ?(+) Arthritis ,  ? Abdominal ?  ?Peds ? Hematology ?negative hematology ROS ?(+)   ?Anesthesia Other Findings ?Day of surgery medications reviewed with patient. ? Reproductive/Obstetrics ?negative OB ROS ? ?  ? ? ? ? ? ? ? ? ? ? ? ? ? ?  ?  ? ? ? ? ? ? ?Anesthesia Physical ?Anesthesia Plan ? ?ASA: 2 ? ?Anesthesia Plan: Spinal  ? ?Post-op Pain Management: Tylenol PO (pre-op)  ? ?Induction:  ? ?PONV Risk Score and Plan: 3 and Treatment may vary due to age or medical condition, Ondansetron, Propofol infusion and Dexamethasone ? ?Airway Management Planned: Natural Airway and Simple Face Mask ? ?Additional Equipment: None ? ?Intra-op Plan:  ? ?Post-operative Plan:  ? ?Informed Consent: I have reviewed the patients History and Physical, chart, labs and discussed the procedure including the risks, benefits and alternatives for the proposed anesthesia with the patient or authorized representative who has indicated his/her understanding and acceptance.  ? ? ? ? ? ?Plan Discussed with: CRNA ? ?Anesthesia Plan Comments:  (See PAT note 01/09/2022, Konrad Felix Ward, PA-C)  ? ? ? ? ?Anesthesia Quick Evaluation ? ?

## 2022-01-11 NOTE — Progress Notes (Signed)
Anesthesia Chart Review   Case: [id number] Date/Time: 01/22/22 0700   Procedure: CONVERSION TO TOTAL HIP (Right: Hip)   Anesthesia type: Spinal   Pre-op diagnosis: NONUNION RIGHT HIP FRACTURE   Location: [facility] ROOM 08 / [facility]   Surgeons: Willaim Sheng, MD       DISCUSSION:81 y.o. former smoker with h/o GERD, hypothyroidism, HTN, nonunion right hip fracture scheduled for above procedure 01/22/2022 with Dr. Charlies Constable.   Pt seen by cardiology 10/25/2021 for preoperative evaluation.  Per OV note, "Pre-operative cardiovascular examination Planning to undergo right hip replacement, date to be determined EKG: Normal sinus rhythm with right bundle branch block. Review of systems positive for precordial discomfort as described above. Given her age, risk factors, and upcoming noncardiac surgery the shared decision was to proceed with echocardiogram and stress test. Further recommendations to follow"  Echo 11/02/2021 with EF 60-65%, no valvular abnormalities.   Low risk stress test 12/04/2021.   Anticipate pt can proceed with planned procedure barring acute status change.   VS: BP (!) 143/92    Pulse 71    Temp 37 C (Oral)    Resp 18    Ht 5' 5"  (1.651 m)    Wt 49.9 kg    SpO2 98%    BMI 18.30 kg/m   PROVIDERS: Unk Pinto, MD is PCP   Rex Kras, DO is Cardiologist  LABS: Labs reviewed: Acceptable for surgery. (all labs ordered are listed, but only abnormal results are displayed)  Labs Reviewed  CBC WITH DIFFERENTIAL/PLATELET - Abnormal; Notable for the following components:      Result Value   MCV 100.2 (*)    All other components within normal limits  COMPREHENSIVE METABOLIC PANEL - Abnormal; Notable for the following components:   Calcium 8.8 (*)    Total Protein 6.0 (*)    Alkaline Phosphatase 31 (*)    All other components within normal limits  SURGICAL PCR SCREEN  TYPE AND SCREEN     IMAGES:   EKG:   CV: Lexiscan (with Mod Bruce protocol) Nuclear  stress test 12/04/2021: Nondiagnostic ECG stress. The heart rate response was consistent with walking Lexiscan stress and achieved 84% of MPHR.  Myocardial perfusion is normal. Overall LV systolic function is normal without regional wall motion abnormalities. Stress LV EF: 52%.  No previous exam available for comparison. Low risk.   Echocardiogram 11/02/2021:  Normal LV systolic function with visual EF 60-65%. Left ventricle cavity  is normal in size. Normal left ventricular wall thickness. Normal global  wall motion. Normal diastolic filling pattern, normal LAP.  No significant valvular abnormalities.  No prior study for comparison. Past Medical History:  Diagnosis Date   Arthritis    Benign labile hypertension    Cancer ([place])    skin cancer on face   Colitis    DJD (degenerative joint disease)    Endometrial polyp    GERD (gastroesophageal reflux disease)    History of basal cell carcinoma excision    History of esophagitis    HOH (hard of hearing)    right ear better than left  -- wears no aides   Hyperlipidemia    Hypothyroidism    Neuromuscular disorder ([place])    neuropathy in feet   Osteopenia    Prediabetes    S/P right hip fracture 08/17/2020   Vitamin D deficiency    Wears glasses     Past Surgical History:  Procedure Laterality Date   BACK SURGERY  2020  CATARACT EXTRACTION, BILATERAL Bilateral 2020   DILATATION & CURETTAGE/HYSTEROSCOPY WITH MYOSURE N/A 10/27/2014   Procedure: DILATATION & CURETTAGE/HYSTEROSCOPY WITH MYOSURE;  Surgeon: Darlyn Chamber, MD;  Location: [facility];  Service: Gynecology;  Laterality: N/A;   DILATION AND CURETTAGE OF UTERUS     HIP SURGERY Right 04/2020   rod and screw in right hip    MOHS SURGERY  2005   LEFT NASAL BRIDGE FOR BASAL CELL   TONSILLECTOMY  age 81    MEDICATIONS:  acetaminophen (TYLENOL) 500 MG tablet   Ascorbic Acid (VITAMIN C) 1000 MG tablet   aspirin EC 81 MG tablet   bisoprolol (ZEBETA) 5 MG  tablet   Calcium Carbonate-Vit D-Min (CALCIUM 1200 PO)   cetirizine (ZYRTEC) 10 MG tablet   CINNAMON PO   Coenzyme Q10 (COQ10) 200 MG CAPS   colestipol (COLESTID) 1 g tablet   dexamethasone (DECADRON) 2 MG tablet   levothyroxine (SYNTHROID) 125 MCG tablet   loratadine (CLARITIN) 10 MG tablet   Multiple Vitamin (THERA VITAMIN PO)   Omega-3 Fatty Acids (FISH OIL) 1200 MG CAPS   pantoprazole (PROTONIX) 40 MG tablet   Polyethyl Glycol-Propyl Glycol 0.4-0.3 % SOLN   Probiotic Product (PROBIOTIC PO)   PROLIA 60 MG/ML SOSY injection   pseudoephedrine (SUDAFED) 120 MG 12 hr tablet   simvastatin (ZOCOR) 20 MG tablet   Vedolizumab (ENTYVIO IV)   No current facility-administered medications for this encounter.     Konrad Felix Ward, PA-C [facility]-Surgical Testing [phone]

## 2022-01-12 DIAGNOSIS — H1132 Conjunctival hemorrhage, left eye: Secondary | ICD-10-CM | POA: Diagnosis not present

## 2022-01-12 DIAGNOSIS — H524 Presbyopia: Secondary | ICD-10-CM | POA: Diagnosis not present

## 2022-01-12 DIAGNOSIS — H52223 Regular astigmatism, bilateral: Secondary | ICD-10-CM | POA: Diagnosis not present

## 2022-01-12 DIAGNOSIS — H5203 Hypermetropia, bilateral: Secondary | ICD-10-CM | POA: Diagnosis not present

## 2022-01-15 ENCOUNTER — Ambulatory Visit: Payer: Medicare Other | Admitting: Internal Medicine

## 2022-01-15 DIAGNOSIS — R490 Dysphonia: Secondary | ICD-10-CM | POA: Diagnosis not present

## 2022-01-15 DIAGNOSIS — R1310 Dysphagia, unspecified: Secondary | ICD-10-CM | POA: Diagnosis not present

## 2022-01-15 DIAGNOSIS — E039 Hypothyroidism, unspecified: Secondary | ICD-10-CM | POA: Diagnosis not present

## 2022-01-15 DIAGNOSIS — K219 Gastro-esophageal reflux disease without esophagitis: Secondary | ICD-10-CM | POA: Diagnosis not present

## 2022-01-15 DIAGNOSIS — Z79890 Hormone replacement therapy (postmenopausal): Secondary | ICD-10-CM | POA: Diagnosis not present

## 2022-01-16 DIAGNOSIS — K52839 Microscopic colitis, unspecified: Principal | ICD-10-CM

## 2022-01-16 MED ORDER — PANTOPRAZOLE 40 MG TABLET,DELAYED RELEASE
ORAL_TABLET | 3 refills | 0 days | Status: CP
Start: 2022-01-16 — End: ?

## 2022-01-16 NOTE — Care Plan (Signed)
Ortho Bundle Case Management Note  Patient Details  Name: Debbie Houston MRN: [id number] Date of Birth: 05-12-1941  Spoke with patient prior to surgery. She will discharge to home with sister and husband to assist. She has equipment at home. hHPT referral to [place] and [place] set up with [place]. Patient and MD in agreement with plan. Choice offered                     DME Arranged:    DME Agency:     HH Arranged:  PT HH Agency:  [facility]  Additional Comments: Please contact me with any questions of if this plan should need to change.  Ladell Heads,  [facility]  [phone] 01/16/2022, 11:41 AM

## 2022-01-18 ENCOUNTER — Other Ambulatory Visit: Payer: Self-pay

## 2022-01-18 ENCOUNTER — Encounter (HOSPITAL_COMMUNITY)
Admission: RE | Admit: 2022-01-18 | Discharge: 2022-01-18 | Disposition: A | Discharge status: Home / Self Care | Payer: Medicare Other | Source: Ambulatory Visit | Attending: Orthopedic Surgery | Admitting: Orthopedic Surgery

## 2022-01-18 DIAGNOSIS — Z01812 Encounter for preprocedural laboratory examination: Secondary | ICD-10-CM | POA: Insufficient documentation

## 2022-01-18 DIAGNOSIS — Z20822 Contact with and (suspected) exposure to covid-19: Secondary | ICD-10-CM | POA: Diagnosis not present

## 2022-01-18 DIAGNOSIS — Z01818 Encounter for other preprocedural examination: Secondary | ICD-10-CM

## 2022-01-18 LAB — SARS CORONAVIRUS 2 (TAT 6-24 HRS): SARS Coronavirus 2: NEGATIVE

## 2022-01-22 ENCOUNTER — Inpatient Hospital Stay (HOSPITAL_COMMUNITY): Payer: Medicare Other

## 2022-01-22 ENCOUNTER — Inpatient Hospital Stay (HOSPITAL_COMMUNITY)
Admission: RE | Admit: 2022-01-22 | Discharge: 2022-01-23 | DRG: 522 | Disposition: A | Discharge status: Home Health | Payer: Medicare Other | Attending: Orthopedic Surgery | Admitting: Orthopedic Surgery

## 2022-01-22 ENCOUNTER — Inpatient Hospital Stay (HOSPITAL_COMMUNITY): Payer: Medicare Other | Admitting: Physician Assistant

## 2022-01-22 ENCOUNTER — Encounter (HOSPITAL_COMMUNITY): Payer: Self-pay | Admitting: Orthopedic Surgery

## 2022-01-22 ENCOUNTER — Inpatient Hospital Stay (HOSPITAL_COMMUNITY): Payer: Medicare Other | Admitting: Certified Registered"

## 2022-01-22 ENCOUNTER — Other Ambulatory Visit: Payer: Self-pay

## 2022-01-22 ENCOUNTER — Encounter (HOSPITAL_COMMUNITY)
Admission: RE | Disposition: A | Discharge status: Home Health | Payer: Self-pay | Source: Home / Self Care | Attending: Orthopedic Surgery

## 2022-01-22 ENCOUNTER — Encounter: Payer: Self-pay | Admitting: Internal Medicine

## 2022-01-22 DIAGNOSIS — Z96641 Presence of right artificial hip joint: Secondary | ICD-10-CM | POA: Diagnosis not present

## 2022-01-22 DIAGNOSIS — Z79890 Hormone replacement therapy (postmenopausal): Secondary | ICD-10-CM

## 2022-01-22 DIAGNOSIS — Z823 Family history of stroke: Secondary | ICD-10-CM | POA: Diagnosis not present

## 2022-01-22 DIAGNOSIS — W19XXXD Unspecified fall, subsequent encounter: Secondary | ICD-10-CM | POA: Diagnosis present

## 2022-01-22 DIAGNOSIS — Z881 Allergy status to other antibiotic agents status: Secondary | ICD-10-CM

## 2022-01-22 DIAGNOSIS — Z87891 Personal history of nicotine dependence: Secondary | ICD-10-CM | POA: Diagnosis not present

## 2022-01-22 DIAGNOSIS — I4510 Unspecified right bundle-branch block: Secondary | ICD-10-CM | POA: Diagnosis present

## 2022-01-22 DIAGNOSIS — S72141A Displaced intertrochanteric fracture of right femur, initial encounter for closed fracture: Secondary | ICD-10-CM | POA: Diagnosis not present

## 2022-01-22 DIAGNOSIS — Z7982 Long term (current) use of aspirin: Secondary | ICD-10-CM

## 2022-01-22 DIAGNOSIS — I10 Essential (primary) hypertension: Secondary | ICD-10-CM | POA: Diagnosis present

## 2022-01-22 DIAGNOSIS — Z9889 Other specified postprocedural states: Secondary | ICD-10-CM

## 2022-01-22 DIAGNOSIS — Z882 Allergy status to sulfonamides status: Secondary | ICD-10-CM

## 2022-01-22 DIAGNOSIS — Z79899 Other long term (current) drug therapy: Secondary | ICD-10-CM | POA: Diagnosis not present

## 2022-01-22 DIAGNOSIS — Z888 Allergy status to other drugs, medicaments and biological substances status: Secondary | ICD-10-CM

## 2022-01-22 DIAGNOSIS — S72141K Displaced intertrochanteric fracture of right femur, subsequent encounter for closed fracture with nonunion: Principal | ICD-10-CM | POA: Diagnosis present

## 2022-01-22 DIAGNOSIS — Z83438 Family history of other disorder of lipoprotein metabolism and other lipidemia: Secondary | ICD-10-CM | POA: Diagnosis not present

## 2022-01-22 DIAGNOSIS — Z833 Family history of diabetes mellitus: Secondary | ICD-10-CM | POA: Diagnosis not present

## 2022-01-22 DIAGNOSIS — M8580 Other specified disorders of bone density and structure, unspecified site: Secondary | ICD-10-CM | POA: Diagnosis present

## 2022-01-22 DIAGNOSIS — Z85828 Personal history of other malignant neoplasm of skin: Secondary | ICD-10-CM

## 2022-01-22 DIAGNOSIS — I739 Peripheral vascular disease, unspecified: Secondary | ICD-10-CM | POA: Diagnosis present

## 2022-01-22 DIAGNOSIS — Z8249 Family history of ischemic heart disease and other diseases of the circulatory system: Secondary | ICD-10-CM | POA: Diagnosis not present

## 2022-01-22 DIAGNOSIS — E782 Mixed hyperlipidemia: Secondary | ICD-10-CM | POA: Diagnosis present

## 2022-01-22 DIAGNOSIS — Z800 Family history of malignant neoplasm of digestive organs: Secondary | ICD-10-CM

## 2022-01-22 DIAGNOSIS — K219 Gastro-esophageal reflux disease without esophagitis: Secondary | ICD-10-CM | POA: Diagnosis present

## 2022-01-22 DIAGNOSIS — Z471 Aftercare following joint replacement surgery: Secondary | ICD-10-CM | POA: Diagnosis not present

## 2022-01-22 DIAGNOSIS — E039 Hypothyroidism, unspecified: Secondary | ICD-10-CM | POA: Diagnosis present

## 2022-01-22 DIAGNOSIS — M810 Age-related osteoporosis without current pathological fracture: Secondary | ICD-10-CM | POA: Diagnosis present

## 2022-01-22 HISTORY — PX: CONVERSION TO TOTAL HIP: SHX5784

## 2022-01-22 SURGERY — CONVERSION, PREVIOUS HIP SURGERY, TO TOTAL HIP ARTHROPLASTY
Anesthesia: Spinal | Site: Hip | Laterality: Right

## 2022-01-22 MED ORDER — 0.9 % SODIUM CHLORIDE (POUR BTL) OPTIME
TOPICAL | Status: DC | PRN
  Administered 2022-01-22: 1000 mL

## 2022-01-22 MED ORDER — WATER FOR IRRIGATION, STERILE IR SOLN
Status: DC | PRN
  Administered 2022-01-22: 2000 mL

## 2022-01-22 MED ORDER — BUPIVACAINE LIPOSOME 1.3 % IJ SUSP
INTRAMUSCULAR | Status: AC
  Filled 2022-01-22: qty 20

## 2022-01-22 MED ORDER — TRANEXAMIC ACID-NACL 1000-0.7 MG/100ML-% IV SOLN
1000.0000 mg | INTRAVENOUS | Status: AC
Start: 2022-01-22 — End: 2022-01-22
  Administered 2022-01-22: 1000 mg via INTRAVENOUS
  Filled 2022-01-22: qty 100

## 2022-01-22 MED ORDER — PROPOFOL 500 MG/50ML IV EMUL
INTRAVENOUS | Status: AC
  Filled 2022-01-22: qty 50

## 2022-01-22 MED ORDER — ONDANSETRON HCL 4 MG/2ML IJ SOLN
INTRAMUSCULAR | Status: DC | PRN
  Administered 2022-01-22: 4 mg via INTRAVENOUS

## 2022-01-22 MED ORDER — POVIDONE-IODINE 10 % EX SWAB: 2.0000 "application " | Freq: Once | CUTANEOUS | Status: DC

## 2022-01-22 MED ORDER — SODIUM CHLORIDE 0.9 % IV SOLN: INTRAVENOUS | Status: DC

## 2022-01-22 MED ORDER — ORAL CARE MOUTH RINSE: 15.0000 mL | Freq: Once | OROMUCOSAL | Status: AC

## 2022-01-22 MED ORDER — ACETAMINOPHEN 500 MG PO TABS
1000.0000 mg | ORAL_TABLET | Freq: Once | ORAL | Status: AC
  Administered 2022-01-22: 1000 mg via ORAL
  Filled 2022-01-22: qty 2

## 2022-01-22 MED ORDER — FENTANYL CITRATE (PF) 100 MCG/2ML IJ SOLN
INTRAMUSCULAR | Status: AC
  Filled 2022-01-22: qty 2

## 2022-01-22 MED ORDER — FENTANYL CITRATE (PF) 100 MCG/2ML IJ SOLN
INTRAMUSCULAR | Status: DC | PRN
  Administered 2022-01-22: 50 ug via INTRAVENOUS

## 2022-01-22 MED ORDER — ONDANSETRON HCL 4 MG/2ML IJ SOLN
INTRAMUSCULAR | Status: AC
  Filled 2022-01-22: qty 2

## 2022-01-22 MED ORDER — SIMVASTATIN 20 MG PO TABS
20.0000 mg | ORAL_TABLET | ORAL | Status: DC
  Administered 2022-01-23: 20 mg via ORAL
  Filled 2022-01-22: qty 1

## 2022-01-22 MED ORDER — BISOPROLOL FUMARATE 5 MG PO TABS
5.0000 mg | ORAL_TABLET | Freq: Every day | ORAL | Status: DC
  Administered 2022-01-22 – 2022-01-23 (×2): 5 mg via ORAL
  Filled 2022-01-22 (×2): qty 1

## 2022-01-22 MED ORDER — SENNA 8.6 MG PO TABS
1.0000 | ORAL_TABLET | Freq: Two times a day (BID) | ORAL | Status: DC
  Administered 2022-01-22: 8.6 mg via ORAL
  Filled 2022-01-22: qty 1

## 2022-01-22 MED ORDER — ACETAMINOPHEN 325 MG PO TABS
325.0000 mg | ORAL_TABLET | Freq: Four times a day (QID) | ORAL | Status: DC | PRN
  Administered 2022-01-23: 650 mg via ORAL
  Filled 2022-01-22: qty 2

## 2022-01-22 MED ORDER — VANCOMYCIN HCL IN DEXTROSE 1-5 GM/200ML-% IV SOLN
1000.0000 mg | Freq: Once | INTRAVENOUS | Status: AC
  Administered 2022-01-22: 1000 mg via INTRAVENOUS
  Filled 2022-01-22: qty 200

## 2022-01-22 MED ORDER — SODIUM CHLORIDE (PF) 0.9 % IJ SOLN
INTRAMUSCULAR | Status: AC
  Filled 2022-01-22: qty 50

## 2022-01-22 MED ORDER — PROPOFOL 10 MG/ML IV BOLUS
INTRAVENOUS | Status: DC | PRN
  Administered 2022-01-22: 100 mg via INTRAVENOUS

## 2022-01-22 MED ORDER — ASCORBIC ACID 500 MG PO TABS
1000.0000 mg | ORAL_TABLET | Freq: Every day | ORAL | Status: DC
  Administered 2022-01-23: 1000 mg via ORAL
  Filled 2022-01-22: qty 2

## 2022-01-22 MED ORDER — PANTOPRAZOLE SODIUM 40 MG PO TBEC
40.0000 mg | DELAYED_RELEASE_TABLET | Freq: Two times a day (BID) | ORAL | Status: DC
  Administered 2022-01-22 – 2022-01-23 (×2): 40 mg via ORAL
  Filled 2022-01-22 (×2): qty 1

## 2022-01-22 MED ORDER — LACTATED RINGERS IV SOLN
INTRAVENOUS | Status: DC
Start: 2022-01-22 — End: 2022-01-22

## 2022-01-22 MED ORDER — OXYCODONE HCL 5 MG/5ML PO SOLN: 5.0000 mg | Freq: Once | ORAL | Status: DC | PRN

## 2022-01-22 MED ORDER — ONDANSETRON HCL 4 MG PO TABS
4.0000 mg | ORAL_TABLET | Freq: Four times a day (QID) | ORAL | Status: DC | PRN
  Administered 2022-01-23: 4 mg via ORAL
  Filled 2022-01-22: qty 1

## 2022-01-22 MED ORDER — FENTANYL CITRATE PF 50 MCG/ML IJ SOSY
25.0000 ug | PREFILLED_SYRINGE | INTRAMUSCULAR | Status: DC | PRN
  Administered 2022-01-22: 50 ug via INTRAVENOUS

## 2022-01-22 MED ORDER — FENTANYL CITRATE PF 50 MCG/ML IJ SOSY
PREFILLED_SYRINGE | INTRAMUSCULAR | Status: AC
  Administered 2022-01-22: 50 ug via INTRAVENOUS
  Filled 2022-01-22: qty 3

## 2022-01-22 MED ORDER — ACETAMINOPHEN 500 MG PO TABS: 1000.0000 mg | ORAL_TABLET | Freq: Once | ORAL | Status: DC

## 2022-01-22 MED ORDER — ZOLPIDEM TARTRATE 5 MG PO TABS: 5.0000 mg | ORAL_TABLET | Freq: Every evening | ORAL | Status: DC | PRN

## 2022-01-22 MED ORDER — SODIUM CHLORIDE (PF) 0.9 % IJ SOLN
INTRAMUSCULAR | Status: DC | PRN
  Administered 2022-01-22: 60 mL

## 2022-01-22 MED ORDER — DEXAMETHASONE SODIUM PHOSPHATE 10 MG/ML IJ SOLN
INTRAMUSCULAR | Status: DC | PRN
  Administered 2022-01-22: 4 mg via INTRAVENOUS

## 2022-01-22 MED ORDER — KETOROLAC TROMETHAMINE 15 MG/ML IJ SOLN
7.5000 mg | Freq: Four times a day (QID) | INTRAMUSCULAR | Status: AC
  Administered 2022-01-22 – 2022-01-23 (×3): 7.5 mg via INTRAVENOUS
  Filled 2022-01-22 (×3): qty 1

## 2022-01-22 MED ORDER — BUPIVACAINE LIPOSOME 1.3 % IJ SUSP
INTRAMUSCULAR | Status: DC | PRN
  Administered 2022-01-22: 20 mL

## 2022-01-22 MED ORDER — BUPIVACAINE LIPOSOME 1.3 % IJ SUSP: 10.0000 mL | Freq: Once | INTRAMUSCULAR | Status: DC

## 2022-01-22 MED ORDER — DEXAMETHASONE SODIUM PHOSPHATE 10 MG/ML IJ SOLN
INTRAMUSCULAR | Status: AC
  Filled 2022-01-22: qty 1

## 2022-01-22 MED ORDER — PHENYLEPHRINE HCL-NACL 20-0.9 MG/250ML-% IV SOLN
INTRAVENOUS | Status: DC | PRN
  Administered 2022-01-22: 50 ug/min via INTRAVENOUS

## 2022-01-22 MED ORDER — LACTATED RINGERS IV SOLN: INTRAVENOUS | Status: DC

## 2022-01-22 MED ORDER — CEFAZOLIN SODIUM-DEXTROSE 2-4 GM/100ML-% IV SOLN
2.0000 g | INTRAVENOUS | Status: AC
  Administered 2022-01-22: 2 g via INTRAVENOUS
  Filled 2022-01-22: qty 100

## 2022-01-22 MED ORDER — PROPOFOL 1000 MG/100ML IV EMUL
INTRAVENOUS | Status: AC
  Filled 2022-01-22: qty 100

## 2022-01-22 MED ORDER — ONDANSETRON HCL 4 MG/2ML IJ SOLN: 4.0000 mg | Freq: Four times a day (QID) | INTRAMUSCULAR | Status: DC | PRN

## 2022-01-22 MED ORDER — OXYCODONE HCL 5 MG PO TABS: 5.0000 mg | ORAL_TABLET | Freq: Once | ORAL | Status: DC | PRN

## 2022-01-22 MED ORDER — VANCOMYCIN HCL IN DEXTROSE 1-5 GM/200ML-% IV SOLN
1000.0000 mg | Freq: Once | INTRAVENOUS | Status: AC
Start: 2022-01-22 — End: 2022-01-22
  Administered 2022-01-22: 1000 mg via INTRAVENOUS
  Filled 2022-01-22 (×2): qty 200

## 2022-01-22 MED ORDER — DROPERIDOL 2.5 MG/ML IJ SOLN: 0.6250 mg | Freq: Once | INTRAMUSCULAR | Status: DC | PRN

## 2022-01-22 MED ORDER — PROPOFOL 10 MG/ML IV BOLUS
INTRAVENOUS | Status: AC
  Filled 2022-01-22: qty 20

## 2022-01-22 MED ORDER — SODIUM CHLORIDE 0.9 % IR SOLN
Status: DC | PRN
  Administered 2022-01-22: 3000 mL
  Administered 2022-01-22: 1000 mL

## 2022-01-22 MED ORDER — BUPIVACAINE HCL (PF) 0.5 % IJ SOLN
INTRAMUSCULAR | Status: DC | PRN
  Administered 2022-01-22: 3 mL via INTRATHECAL

## 2022-01-22 MED ORDER — OXYCODONE HCL 5 MG PO TABS
5.0000 mg | ORAL_TABLET | ORAL | Status: DC | PRN
  Administered 2022-01-22 – 2022-01-23 (×2): 10 mg via ORAL
  Filled 2022-01-22 (×2): qty 2

## 2022-01-22 MED ORDER — PHENOL 1.4 % MT LIQD: 1.0000 | OROMUCOSAL | Status: DC | PRN

## 2022-01-22 MED ORDER — PHENYLEPHRINE 40 MCG/ML (10ML) SYRINGE FOR IV PUSH (FOR BLOOD PRESSURE SUPPORT)
PREFILLED_SYRINGE | INTRAVENOUS | Status: AC
  Filled 2022-01-22: qty 10

## 2022-01-22 MED ORDER — DOCUSATE SODIUM 100 MG PO CAPS
100.0000 mg | ORAL_CAPSULE | Freq: Two times a day (BID) | ORAL | Status: DC
  Administered 2022-01-22 – 2022-01-23 (×2): 100 mg via ORAL
  Filled 2022-01-22 (×2): qty 1

## 2022-01-22 MED ORDER — HYDROMORPHONE HCL 1 MG/ML IJ SOLN: 0.5000 mg | INTRAMUSCULAR | Status: DC | PRN

## 2022-01-22 MED ORDER — CEFAZOLIN SODIUM-DEXTROSE 1-4 GM/50ML-% IV SOLN
1.0000 g | Freq: Four times a day (QID) | INTRAVENOUS | Status: AC
  Administered 2022-01-22 (×2): 1 g via INTRAVENOUS
  Filled 2022-01-22 (×2): qty 50

## 2022-01-22 MED ORDER — DIPHENHYDRAMINE HCL 12.5 MG/5ML PO ELIX: 12.5000 mg | ORAL_SOLUTION | ORAL | Status: DC | PRN

## 2022-01-22 MED ORDER — PROPOFOL 500 MG/50ML IV EMUL
INTRAVENOUS | Status: DC | PRN
  Administered 2022-01-22: 150 ug/kg/min via INTRAVENOUS

## 2022-01-22 MED ORDER — ASPIRIN 81 MG PO CHEW
81.0000 mg | CHEWABLE_TABLET | Freq: Two times a day (BID) | ORAL | Status: DC
  Administered 2022-01-22 – 2022-01-23 (×2): 81 mg via ORAL
  Filled 2022-01-22 (×2): qty 1

## 2022-01-22 MED ORDER — SODIUM CHLORIDE (PF) 0.9 % IJ SOLN
INTRAMUSCULAR | Status: AC
  Filled 2022-01-22: qty 10

## 2022-01-22 MED ORDER — MENTHOL 3 MG MT LOZG: 1.0000 | LOZENGE | OROMUCOSAL | Status: DC | PRN

## 2022-01-22 MED ORDER — EPHEDRINE 5 MG/ML INJ
INTRAVENOUS | Status: AC
  Filled 2022-01-22: qty 5

## 2022-01-22 MED ORDER — LEVOTHYROXINE SODIUM 125 MCG PO TABS
125.0000 ug | ORAL_TABLET | ORAL | Status: DC
  Administered 2022-01-22: 125 ug via ORAL

## 2022-01-22 MED ORDER — CHLORHEXIDINE GLUCONATE 0.12 % MT SOLN
15.0000 mL | Freq: Once | OROMUCOSAL | Status: AC
  Administered 2022-01-22: 15 mL via OROMUCOSAL

## 2022-01-22 SURGICAL SUPPLY — 72 items
ADH SKN CLS APL DERMABOND .7 (GAUZE/BANDAGES/DRESSINGS) ×1
APL PRP STRL LF DISP 70% ISPRP (MISCELLANEOUS) ×2
BAG COUNTER SPONGE SURGICOUNT (BAG) IMPLANT
BAG DECANTER FOR FLEXI CONT (MISCELLANEOUS) ×1 IMPLANT
BAG SPEC THK2 15X12 ZIP CLS (MISCELLANEOUS) ×1
BAG SPNG CNTER NS LX DISP (BAG)
BAG ZIPLOCK 12X15 (MISCELLANEOUS) ×2 IMPLANT
BLADE SAW RECIPROCATING 77.5 (BLADE) ×1 IMPLANT
BLADE SAW SAG 25X90X1.19 (BLADE) IMPLANT
CHLORAPREP W/TINT 26 (MISCELLANEOUS) ×4 IMPLANT
COVER SURGICAL LIGHT HANDLE (MISCELLANEOUS) ×2 IMPLANT
DERMABOND ADVANCED (GAUZE/BANDAGES/DRESSINGS) ×1
DERMABOND ADVANCED .7 DNX12 (GAUZE/BANDAGES/DRESSINGS) ×1 IMPLANT
DRAPE 3/4 80X56 (DRAPES) ×4 IMPLANT
DRAPE C-ARM 42X120 X-RAY (DRAPES) ×1 IMPLANT
DRAPE C-ARMOR (DRAPES) ×1 IMPLANT
DRAPE HIP W/POCKET STRL (MISCELLANEOUS) ×2 IMPLANT
DRAPE INCISE IOBAN 66X45 STRL (DRAPES) ×2 IMPLANT
DRAPE INCISE IOBAN 85X60 (DRAPES) ×2 IMPLANT
DRAPE POUCH INSTRU U-SHP 10X18 (DRAPES) ×2 IMPLANT
DRAPE SURG 17X11 SM STRL (DRAPES) ×2 IMPLANT
DRAPE U-SHAPE 47X51 STRL (DRAPES) ×2 IMPLANT
DRESSING AQUACEL AG SP 3.5X10 (GAUZE/BANDAGES/DRESSINGS) ×1 IMPLANT
DRSG AQUACEL AG SP 3.5X10 (GAUZE/BANDAGES/DRESSINGS) ×2
ELECT BLADE TIP CTD 4 INCH (ELECTRODE) ×2 IMPLANT
ELECT REM PT RETURN 15FT ADLT (MISCELLANEOUS) ×2 IMPLANT
GLOVE SRG 8 PF TXTR STRL LF DI (GLOVE) ×1 IMPLANT
GLOVE SURG ENC TEXT LTX SZ8 (GLOVE) ×4 IMPLANT
GLOVE SURG UNDER POLY LF SZ8 (GLOVE) ×2
GOWN STRL REUS W/TWL XL LVL3 (GOWN DISPOSABLE) ×2 IMPLANT
HANDPIECE INTERPULSE COAX TIP (DISPOSABLE)
HEAD BIOLOX HIP 28/+4 (Joint) IMPLANT
HIP BIOLOX HD 28/+4 (Joint) ×2 IMPLANT
HIP MODULAR SYS 21MM P 0V40 (Hips) ×2 IMPLANT
HOLDER FOLEY CATH W/STRAP (MISCELLANEOUS) ×2 IMPLANT
HOOD PEEL AWAY FLYTE STAYCOOL (MISCELLANEOUS) ×6 IMPLANT
KIT BASIN OR (CUSTOM PROCEDURE TRAY) ×2 IMPLANT
KIT TURNOVER KIT A (KITS) IMPLANT
LINER 42MM E (Orthopedic Implant) ×1 IMPLANT
LINER ADM MDM INS 28/48 42E (Liner) ×1 IMPLANT
MANIFOLD NEPTUNE II (INSTRUMENTS) ×2 IMPLANT
MARKER SKIN DUAL TIP RULER LAB (MISCELLANEOUS) ×2 IMPLANT
NEEDLE HYPO 22GX1.5 SAFETY (NEEDLE) IMPLANT
NS IRRIG 1000ML POUR BTL (IV SOLUTION) ×2 IMPLANT
PACK TOTAL JOINT (CUSTOM PROCEDURE TRAY) ×2 IMPLANT
PROTECTOR NERVE ULNAR (MISCELLANEOUS) ×2 IMPLANT
RETRIEVER SUT HEWSON (MISCELLANEOUS) ×2 IMPLANT
SCREW HEX LP 6.5X20 (Screw) ×1 IMPLANT
SCREW HEX LP 6.5X25 (Screw) ×1 IMPLANT
SEALER BIPOLAR AQUA 6.0 (INSTRUMENTS) ×2 IMPLANT
SET HNDPC FAN SPRY TIP SCT (DISPOSABLE) IMPLANT
SHELL CLUSTERHOLE ACETABULAR 5 (Shell) ×1 IMPLANT
SPIKE FLUID TRANSFER (MISCELLANEOUS) ×6 IMPLANT
SPONGE T-LAP 18X18 ~~LOC~~+RFID (SPONGE) ×6 IMPLANT
STEM FEM CON DIST 18X115 (Stem) ×1 IMPLANT
SUCTION FRAZIER HANDLE 12FR (TUBING) ×2
SUCTION TUBE FRAZIER 12FR DISP (TUBING) ×1 IMPLANT
SUT BONE WAX W31G (SUTURE) ×2 IMPLANT
SUT ETHIBOND #5 BRAIDED 30INL (SUTURE) ×2 IMPLANT
SUT ETHIBOND 2 V 37 (SUTURE) ×1 IMPLANT
SUT MNCRL AB 3-0 PS2 18 (SUTURE) ×2 IMPLANT
SUT STRATAFIX 0 PDS 27 VIOLET (SUTURE) ×2
SUT STRATAFIX PDO 1 14 VIOLET (SUTURE) ×2
SUT STRATFX PDO 1 14 VIOLET (SUTURE) ×1
SUT VIC AB 2-0 CT2 27 (SUTURE) ×4 IMPLANT
SUTURE STRATFX 0 PDS 27 VIOLET (SUTURE) ×1 IMPLANT
SUTURE STRATFX PDO 1 14 VIOLET (SUTURE) ×1 IMPLANT
SYR 20ML LL LF (SYRINGE) ×4 IMPLANT
SYSTEM MODULAR HIP 21MM P 0V40 (Hips) IMPLANT
TOWEL OR 17X26 10 PK STRL BLUE (TOWEL DISPOSABLE) ×2 IMPLANT
TRAY FOLEY MTR SLVR 16FR STAT (SET/KITS/TRAYS/PACK) ×2 IMPLANT
WATER STERILE IRR 1000ML POUR (IV SOLUTION) ×4 IMPLANT

## 2022-01-22 NOTE — Op Note (Signed)
01/22/2022  11:06 AM  PATIENT:  Debbie Houston   MRN: [id number]  PRE-OPERATIVE DIAGNOSIS: Right intertrochanteric femur fracture nonunion  POST-OPERATIVE DIAGNOSIS:  same  PROCEDURE:  Procedure(s): Removal of cephalomedullary nail, conversion to total hip arthroplasty, repair of abductor tendon and greater troches nonunion  PREOPERATIVE INDICATIONS:    Debbie Houston is an 81 y.o. female who has a diagnosis of right intertrochanteric femur fracture nonunion.  She sustained intertrochanteric femur fracture following a fall in June 2021.  She was fixed with a cephalomedullary nail at [facility].  Postoperatively she had significant subsidence of her fracture and persistent nonunion.  Repeat CT scan confirmed nonunited intertrochanteric femur fracture.  The patient is significant pain and discomfort.  Aspiration of the right hip January 5 showed no growth.  She continued to have pain and dysfunction and elected for surgical management after failing conservative treatment.  The risks benefits and alternatives were discussed with the patient including but not limited to the risks of nonoperative treatment, versus surgical intervention including infection, bleeding, nerve injury, periprosthetic fracture, the need for revision surgery, dislocation, leg length discrepancy, blood clots, cardiopulmonary complications, morbidity, mortality, among others, and they were willing to proceed.     OPERATIVE REPORT     SURGEON:  Weber Cooks, MD    ASSISTANT: Darron Doom, RNFA, (Present throughout the entire procedure,  necessary for completion of procedure in a timely manner, assisting with retraction, instrumentation, and closure)     ANESTHESIA:  Spinal  ESTIMATED BLOOD LOSS: 250cc    COMPLICATIONS:  None.     UNIQUE ASPECTS OF THE CASE: Intertrochanteric nonunion involving the lesser trochanter posterior aspect of the greater trochanter.  Relatively small posterior greater troches fragment  with fibrous union measuring approximately 2.5 x 3.5 cm.  This was repaired side-to-side to the anterior trochanter through the abductor tendon with #2 Ethibond  COMPONENTS:   Removal of Synthes cephalomedullary nail Implantation of 52 mm Trident 2 acetabular shell with 2 screws, 28/48 MDM liner, 42 mm outer ball, 28+4 ceramic inner ball, 115 mm distal restoration modular stem, 21+0 proximal body.  Implant Name Type Inv. Item Serial No. Manufacturer Lot No. LRB No. Used Action  SHELL CLUSTERHOLE ACETABULAR 5 - [id number] Shell SHELL CLUSTERHOLE ACETABULAR 5  [employer] [id number] Right 1 Implanted  SCREW HEX LP 6.5X20 - [id number] Screw SCREW HEX LP 6.5X20  [employer] [id number] Right 1 Implanted  LINER E - [id number] Orthopedic Implant LINER E  [employer] [id number] Right 1 Implanted  SCREW HEX LP 6.5X25 - [id number] Screw SCREW HEX LP 6.5X25  [employer] [id number] Right 1 Implanted  RESTORATION MODULAR HIP SYSTEM [id number]    [employer] [id number] Right 1 Implanted  HIP MODULAR SYS P 0V40 - [id number] Hips HIP MODULAR SYS P 0V40  [employer] [id number] Right 1 Implanted  LINER ADM MDM INS 28/48 42E - [id number] Liner LINER ADM MDM INS 28/48 42E  [employer] [id number] Right 1 Implanted  HIP BIOLOX HD 28/+4 - [id number] Joint HIP BIOLOX HD 28/+4  [employer] [id number] Right 1 Implanted      PROCEDURE IN DETAIL:   The patient was met in the holding area and  identified.  The appropriate hip was identified and marked at the operative site.  The patient was then transported to the OR  and  placed under anesthesia.  At that point, the patient was  placed in the lateral decubitus position with the operative side up and  secured to the operating room table  and all bony prominences padded. A subaxillary role was also placed.    The operative lower extremity was prepped from the iliac crest to the distal leg.  Sterile draping was  performed.  Time out was performed prior to incision.      A routine posterolateral approach was utilized via sharp dissection  carried down to the subcutaneous tissue.  Incisions from her intramedullary nail were far away from my incision and were not utilized. Gross bleeders were Bovie coagulated.  The iliotibial band was identified and incised along the length of the skin incision through the glute max fascia.  Charnley retractor was placed with care to protect the sciatic nerve posteriorly.  With the hip internally rotated, the external rotators and capsule were released as a single sleeve and tagged with a a #5 Ethibond.  The hip was then dislocated.   The hip was then relocated and the cephalomedullary screw was engaged and successfully removed.  Nail was then engaged from the top and also disimpacted this was done atraumatically without any complications.  There was noted to be a nonunited piece of posterior greater trochanter that was overhanging the neck.  This was partially resected with a reciprocating saw to allow for exposure of the femoral neck.  Given subsidence of the femoral neck and head the femoral neck was resected at the level of the lesser trochanter.  The lesser trochanter was also noted to have a fibrous union.    I then exposed the deep acetabulum, cleared out any tissue including the ligamentum teres.  After adequate visualization, I excised the labrum.  I then started reaming with a 46 mm reamer, first medializing to the floor of the cotyloid fossa, and then in the position of the cup aiming towards the greater sciatic notch, matching the version of the transverse acetabular ligament and tucked under the anterior wall. I reamed up to 52 mm reamer with good bony bed preparation and a 52 mm cup was chosen.  The real cup was then impacted into place.  Appropriate version and inclination was confirmed clinically matching their bony anatomy, and also with the use of the jig.  I placed 2  screws in the posterior superior quadrant to augment fixation.  Given patient's spinal fusion and nonunion of the abductors for additional stability elected to use a dual mobility liner. A MDM liner was placed and impacted. It was confirmed to be appropriately seated and the acetabular retractors were removed.    I then prepared the proximal femur using Charnley awl.  Fluoroscopy was used to confirm that we were not in varus or apex anterior with our reaming.  We sequentially reamed the proximal femur for 115 mm distal body.  We reamed up to a 18 mm distal body which had excellent cortical purchase and fit.  Fluoroscopy confirmed that we were in good position on the AP and lateral localize centrally with her distal stem.  We open the final distal body which was impacted and had excellent purchase.  It was seated to the appropriate depth.  We then reamed for proximal body up to a 21 mm proximal body.  We first trialed with a standard +0 proximal body with about 30 degrees of anteversion.  A +0 trial head ball was first utilized.   There was no impingement with full extension and 90 degrees external rotation.  The hip was stable at the position of sleep and with 90 degrees flexion and  70 degrees of internal rotation.  Leg lengths were were felt to still be slightly short.  To add offset and length elected to trial a +4 ball.  With this had much better stability and felt the likely length had been restored.  Hip was stable up to 90 degrees flexion and over 80 degrees of internal rotation.  The final proximal body was then opened and impacted.  The screw was then inserted and final tightened.  Final proximal body was placed in about 30 degrees of anteversion.  This was again trialed with a +4 femoral head trial ball and had excellent stability.  The final dual mobility ball construct was opened and assembled.  Inner ball was confirmed to be freely mobile in the outer ball.  I impacted the real head ball  into place. The hip was then reduced and taken through a range of motion. There was no impingement with full extension and 90 degrees external rotation.  The hip was stable at the position of sleep and with 90 degrees flexion and 80 degrees of internal rotation. Leg lengths were  again assessed and felt to be restored.  Final fluoroscopic images were obtained and confirmed excellent position of components without fracture.  The posterior capsule was then closed with #5 Ethibond.   The abductor tendon of the posterior trochanter piece which still had good gluteus medius attached was then repaired side to side to the remaining intact trochanter using #2 Ethibond.    I then irrigated the hip copiously again with pulse lavage. Periarticular injection was then performed with Exparel.   We repaired the fascia #1 barbed suture, followed by 0 barbed suture for the subcutaneous fat.  Skin was closed with 2-0 Vicryl and 3-0 Monocryl.  Dermabond and Aquacel dressing were applied. The patient was then awakened and returned to PACU in stable and satisfactory condition.  Leg lengths in the supine position were assessed and felt to be clinically equal. There were no complications.  Post op recs: WB: WBAT RLE, posterior hip precautions x6 weeks Abx: Vanc + ancef x23 hours post op, followed by cefadroxil 500 BID x7 days given hx of prior surgery Imaging: PACU pelvis Xray Dressing: Aquacell, keep intact until follow up DVT prophylaxis: Aspirin 81BID starting POD1 Follow up: 2 weeks after surgery for a wound check with Dr. Blanchie Dessert at [facility].  Address: [address], [place], [place] [address]  Office Phone: [phone]   Weber Cooks, MD Orthopedic Surgeon

## 2022-01-22 NOTE — Evaluation (Signed)
Physical Therapy Evaluation ?Patient Details ?Name: Debbie Houston ?MRN: [id number] ?DOB: 05-11-1941 ?Today's Date: 01/22/2022 ? ?History of Present Illness ? 81 YO female S/P removal right femor hardware for Nonunion fracture since 2021, S/P and posterior THA on 01/22/22.  ?Clinical Impression ? The patient is very spry and ready to ambulate. Cues for safety.cues for Posterior hip precautions, placed roll between  legs for support to decreased internal rotation. Patient has  a friend who will be assisting at DC, Patient's spouse is unable to assist. ? Pt admitted with above diagnosis.  Pt currently with functional limitations due to the deficits listed below (see PT Problem List). Pt will benefit from skilled PT to increase their independence and safety with mobility to allow discharge to the venue listed below.   ? ?   ? ?Recommendations for follow up therapy are one component of a multi-disciplinary discharge planning process, led by the attending physician.  Recommendations may be updated based on patient status, additional functional criteria and insurance authorization. ? ?Follow Up Recommendations Follow physician's recommendations for discharge plan and follow up therapies (recommend HHPT) ? ?  ?Assistance Recommended at Discharge Frequent or constant Supervision/Assistance  ?Patient can return home with the following ? A little help with walking and/or transfers;Assistance with cooking/housework;Assist for transportation;Help with stairs or ramp for entrance;A little help with bathing/dressing/bathroom ? ?  ?Equipment Recommendations None recommended by PT  ?Recommendations for Other Services ?    ?  ?Functional Status Assessment Patient has had a recent decline in their functional status and demonstrates the ability to make significant improvements in function in a reasonable and predictable amount of time.  ? ?  ?Precautions / Restrictions Precautions ?Precautions: Posterior Hip ?Precaution Booklet Issued: Yes  (comment) ?Restrictions ?Weight Bearing Restrictions: No  ? ?  ? ?Mobility ? Bed Mobility ?Overal bed mobility: Needs Assistance ?Bed Mobility: Supine to Sit ?  ?  ?Supine to sit: Min assist ?  ?  ?General bed mobility comments: cues for hip precautions, Patient  tends to rotate right hip Internal when leaning  to get uo from bed ?  ? ?Transfers ?Overall transfer level: Needs assistance ?Equipment used: Rolling Cotham (2 wheels) ?Transfers: Sit to/from Stand ?Sit to Stand: Min assist ?  ?  ?  ?  ?  ?General transfer comment: cues for position inside RW, ?  ? ?Ambulation/Gait ?Ambulation/Gait assistance: Min assist ?Gait Distance (Feet): 20 Feet ?Assistive device: Rolling Spates (2 wheels) ?Gait Pattern/deviations: Step-to pattern, Step-through pattern ?Gait velocity: decr ?  ?  ?General Gait Details: patient felt a little dizzy so limited distance ? ?Stairs ?  ?  ?  ?  ?  ? ?Wheelchair Mobility ?  ? ?Modified Rankin (Stroke Patients Only) ?  ? ?  ? ?Balance Overall balance assessment: Needs assistance ?Sitting-balance support: No upper extremity supported, Feet supported ?Sitting balance-Leahy Scale: Good ?  ?  ?Standing balance support: During functional activity, Bilateral upper extremity supported, Reliant on assistive device for balance ?Standing balance-Leahy Scale: Poor ?  ?  ?  ?  ?  ?  ?  ?  ?  ?  ?  ?  ?   ? ? ? ?Pertinent Vitals/Pain Pain Assessment ?Pain Assessment: 0-10 ?Pain Score: 4  ?Pain Location: right hip ?Pain Descriptors / Indicators: Aching, Grimacing ?Pain Intervention(s): Monitored during session, Premedicated before session, Ice applied  ? ? ?Home Living Family/patient expects to be discharged to:: Private residence ?Living Arrangements: Spouse/significant other;Children ?  ?  ?  ?  ?  ?  ?  ?  ?   ?  ?  Prior Function   ?  ?  ?  ?  ?  ?  ?  ?  ?  ? ? ?Hand Dominance  ?   ? ?  ?Extremity/Trunk Assessment  ? Upper Extremity Assessment ?Upper Extremity Assessment: Overall WFL for tasks assessed ?   ? ?Lower Extremity Assessment ?Lower Extremity Assessment: RLE deficits/detail ?RLE Deficits / Details: able to slide leg to bed edge with min assist and cues for posterior hipprecautions ?  ? ?   ?Communication  ?    ?Cognition Arousal/Alertness: Awake/alert ?Behavior During Therapy: [facility] for tasks assessed/performed, Impulsive ?Overall Cognitive Status: Within Functional Limits for tasks assessed ?  ?  ?  ?  ?  ?  ?  ?  ?  ?  ?  ?  ?  ?  ?  ?  ?  ?  ?  ? ?  ?General Comments   ? ?  ?Exercises    ? ?Assessment/Plan  ?  ?PT Assessment Patient needs continued PT services  ?PT Problem List Decreased strength;Decreased mobility;Decreased safety awareness;Decreased knowledge of precautions;Decreased activity tolerance;Decreased balance;Decreased cognition;Pain ? ?   ?  ?PT Treatment Interventions DME instruction;Therapeutic activities;Gait training;Therapeutic exercise;Patient/family education;Stair training;Functional mobility training   ? ?PT Goals (Current goals can be found in the Care Plan section)  ?Acute Rehab PT Goals ?Patient Stated Goal: go home, not have pain ?PT Goal Formulation: With patient/family ?Time For Goal Achievement: 01/29/22 ?Potential to Achieve Goals: Good ? ?  ?Frequency 7X/week ?  ? ? ?Co-evaluation   ?  ?  ?  ?  ? ? ?  ?AM-PAC PT "6 Clicks" Mobility  ?Outcome Measure Help needed turning from your back to your side while in a flat bed without using bedrails?: A Lot ?Help needed moving from lying on your back to sitting on the side of a flat bed without using bedrails?: A Lot ?Help needed moving to and from a bed to a chair (including a wheelchair)?: A Little ?Help needed standing up from a chair using your arms (e.g., wheelchair or bedside chair)?: A Little ?Help needed to walk in hospital room?: A Little ?Help needed climbing 3-5 steps with a railing? : A Lot ?6 Click Score: 15 ? ?  ?End of Session Equipment Utilized During Treatment: Gait belt ?Activity Tolerance: Patient tolerated treatment  well ?Patient left: in chair;with call bell/phone within reach ?Nurse Communication: Mobility status ?PT Visit Diagnosis: Unsteadiness on feet (R26.81) ?  ? ?Time: 8937-3428 ?PT Time Calculation (min) (ACUTE ONLY): 34 min ? ? ?Charges:   PT Evaluation ?$PT Eval Low Complexity: 1 Low ?PT Treatments ?$Gait Training: 8-22 mins ?  ?   ? ? ?Tresa Endo PT ?Acute Rehabilitation Services ?Pager [phone] ?Office [phone] ? ?Debbie Houston ?01/22/2022, 4:56 PM ? ?

## 2022-01-22 NOTE — Interval H&P Note (Signed)
The patient has been re-examined, and the chart reviewed, and there have been no interval changes to the documented history and physical.  Chronic right intertroch nonunion with significant shortening and subsidence. Plan for removal of nail and conversion to total hip with a revision femoral stem. ? ?The operative side was examined and the patient was confirmed to have. Sens DPN, SPN, TN intact, Motor EHL, ext, flex 5/5, and DP 2+, PT 2+, No significant edema. ? ?The risks, benefits, and alternatives have been discussed at length with patient, and the patient is willing to proceed.  Right hip marked. Consent has been signed. ? ?

## 2022-01-22 NOTE — Anesthesia Postprocedure Evaluation (Signed)
Anesthesia Post Note ? ?Patient: Debbie Houston ? ?Procedure(s) Performed: CONVERSION TO TOTAL HIP (Right: Hip) ? ?  ? ?Patient location during evaluation: PACU ?Anesthesia Type: General ?Level of consciousness: awake and alert ?Pain management: pain level controlled ?Vital Signs Assessment: post-procedure vital signs reviewed and stable ?Respiratory status: spontaneous breathing, nonlabored ventilation and respiratory function stable ?Cardiovascular status: blood pressure returned to baseline ?Postop Assessment: no apparent nausea or vomiting ?Anesthetic complications: no ? ? ?No notable events documented. ?  ?  ?  ? ?Marthenia Rolling ? ? ? ? ?

## 2022-01-22 NOTE — Anesthesia Procedure Notes (Signed)
Procedure Name: LMA Insertion ?Date/Time: 01/22/2022 8:01 AM ?Performed by: Rosaland Lao, CRNA ?Pre-anesthesia Checklist: Patient identified, Emergency Drugs available, Suction available and Patient being monitored ?Patient Re-evaluated:Patient Re-evaluated prior to induction ?Oxygen Delivery Method: Circle system utilized ?Preoxygenation: Pre-oxygenation with 100% oxygen ?Induction Type: IV induction ?LMA: LMA inserted ?LMA Size: 4.0 ?Tube type: Oral ?Number of attempts: 1 ?Placement Confirmation: positive ETCO2 and breath sounds checked- equal and bilateral ?Tube secured with: Tape ?Dental Injury: Teeth and Oropharynx as per pre-operative assessment  ? ? ? ? ?

## 2022-01-22 NOTE — Anesthesia Procedure Notes (Signed)
Spinal ? ?Patient location during procedure: OR ?Start time: 01/22/2022 7:46 AM ?End time: 01/22/2022 7:50 AM ?Reason for block: surgical anesthesia ?Staffing ?Performed: anesthesiologist  ?Anesthesiologist: Brennan Bailey, MD ?Preanesthetic Checklist ?Completed: patient identified, IV checked, risks and benefits discussed, surgical consent, monitors and equipment checked, pre-op evaluation and timeout performed ?Spinal Block ?Patient position: sitting ?Prep: DuraPrep and site prepped and draped ?Patient monitoring: continuous pulse ox, blood pressure and heart rate ?Approach: right paramedian ?Location: L3-4 ?Injection technique: single-shot ?Needle ?Needle type: Pencan  ?Needle gauge: 24 G ?Needle length: 9 cm ?Assessment ?Events: CSF return ?Additional Notes ?Risks, benefits, and alternative discussed. Patient gave consent to procedure. Prepped and draped in sitting position. Patient sedated but responsive to voice. Unable to access interspinous space from midline approach, clear CSF obtained from right paramedian. Free flow of CSF prior to injection, but unable to aspirate after injection. No pain or paraesthesias with injection. Patient tolerated procedure well. Vital signs stable. Tawny Asal, MD ? ? ? ? ?

## 2022-01-22 NOTE — Transfer of Care (Signed)
Immediate Anesthesia Transfer of Care Note ? ?Patient: Debbie Houston ? ?Procedure(s) Performed: CONVERSION TO TOTAL HIP (Right: Hip) ? ?Patient Location: PACU ? ?Anesthesia Type:General and Spinal ? ?Level of Consciousness: drowsy and responds to stimulation ? ?Airway & Oxygen Therapy: Patient Spontanous Breathing and Patient connected to face mask oxygen ? ?Post-op Assessment: Report given to RN and Post -op Vital signs reviewed and stable ? ?Post vital signs: Reviewed and stable ? ?Last Vitals:  ?Vitals Value Taken Time  ?BP 136/80 01/22/22 1137  ?Temp    ?Pulse 64 01/22/22 1138  ?Resp 14 01/22/22 1138  ?SpO2 100 % 01/22/22 1138  ?Vitals shown include unvalidated device data. ? ?Last Pain:  ?Vitals:  ? 01/22/22 0620  ?TempSrc:   ?PainSc: 0-No pain  ?   ? ?  ? ?Complications: No notable events documented. ?

## 2022-01-23 ENCOUNTER — Encounter (HOSPITAL_COMMUNITY): Payer: Self-pay | Admitting: Orthopedic Surgery

## 2022-01-23 LAB — CBC
HCT: 29.8 % — ABNORMAL LOW (ref 36.0–46.0)
Hemoglobin: 9.9 g/dL — ABNORMAL LOW (ref 12.0–15.0)
MCH: 32.9 pg (ref 26.0–34.0)
MCHC: 33.2 g/dL (ref 30.0–36.0)
MCV: 99 fL (ref 80.0–100.0)
Platelets: 219 10*3/uL (ref 150–400)
RBC: 3.01 MIL/uL — ABNORMAL LOW (ref 3.87–5.11)
RDW: 14.3 % (ref 11.5–15.5)
WBC: 7.4 10*3/uL (ref 4.0–10.5)
nRBC: 0 % (ref 0.0–0.2)

## 2022-01-23 LAB — BASIC METABOLIC PANEL
Anion gap: 5 (ref 5–15)
BUN: 13 mg/dL (ref 8–23)
CO2: 26 mmol/L (ref 22–32)
Calcium: 8.2 mg/dL — ABNORMAL LOW (ref 8.9–10.3)
Chloride: 103 mmol/L (ref 98–111)
Creatinine, Ser: 0.73 mg/dL (ref 0.44–1.00)
GFR, Estimated: >60 mL/min (ref >60)
Glucose, Bld: 114 mg/dL — ABNORMAL HIGH (ref 70–99)
Potassium: 3.3 mmol/L — ABNORMAL LOW (ref 3.5–5.1)
Sodium: 134 mmol/L — ABNORMAL LOW (ref 135–145)

## 2022-01-23 MED ORDER — ASPIRIN EC 81 MG PO TBEC
81.0000 mg | DELAYED_RELEASE_TABLET | Freq: Two times a day (BID) | ORAL | 0 refills | Status: AC
Start: 2022-01-23 — End: ?

## 2022-01-23 MED ORDER — CEFADROXIL 500 MG PO CAPS: 500.0000 mg | ORAL_CAPSULE | Freq: Two times a day (BID) | ORAL | 0 refills | Status: AC

## 2022-01-23 MED ORDER — ONDANSETRON HCL 4 MG PO TABS: 4.0000 mg | ORAL_TABLET | Freq: Three times a day (TID) | ORAL | 0 refills | Status: AC | PRN

## 2022-01-23 MED ORDER — TRAMADOL HCL 50 MG PO TABS
50.0000 mg | ORAL_TABLET | Freq: Four times a day (QID) | ORAL | 0 refills | Status: AC | PRN
Start: 2022-01-23 — End: 2022-01-30

## 2022-01-23 MED ORDER — CEFADROXIL 500 MG PO CAPS
500.0000 mg | ORAL_CAPSULE | Freq: Two times a day (BID) | ORAL | Status: DC
  Administered 2022-01-23: 500 mg via ORAL
  Filled 2022-01-23: qty 1

## 2022-01-23 NOTE — Progress Notes (Signed)
Physical Therapy Treatment ?Patient Details ?Name: Debbie Houston ?MRN: [id number] ?DOB: 1941/07/20 ?Today's Date: 01/23/2022 ? ? ?History of Present Illness 81 YO female S/P removal right femor hardware for Nonunion fracture since 2021, S/P and posterior THA on 01/22/22. ? ?  ?PT Comments  ? ? Sister present  for instruction in steps using  right rail and 1 cane. Patient has met PT goals.   ?Recommendations for follow up therapy are one component of a multi-disciplinary discharge planning process, led by the attending physician.  Recommendations may be updated based on patient status, additional functional criteria and insurance authorization. ? ?Follow Up Recommendations ? Follow physician's recommendations for discharge plan and follow up therapies ?  ?  ?Assistance Recommended at Discharge Set up Supervision/Assistance  ?Patient can return home with the following A little help with walking and/or transfers;Assistance with cooking/housework;Assist for transportation;Help with stairs or ramp for entrance;A little help with bathing/dressing/bathroom ?  ?Equipment Recommendations ? None recommended by PT  ?  ?Recommendations for Other Services   ? ? ?  ?Precautions / Restrictions Precautions ?Precautions: Posterior Hip;Fall  ?  ? ?Mobility ? Bed Mobility ?  ?  ?  ?  ?  ?  ?  ?General bed mobility comments: in recliner ?  ? ?Transfers ?Overall transfer level: Needs assistance ?Equipment used: Rolling Hable (2 wheels) ?Transfers: Sit to/from Stand ?Sit to Stand: Min guard ?  ?  ?  ?  ?  ?General transfer comment: cues for safety, stands when RW not near her ?  ? ?Ambulation/Gait ?Ambulation/Gait assistance: Min guard ?Gait Distance (Feet): 80 Feet ?Assistive device: Rolling [place] (2 wheels) ?Gait Pattern/deviations: Step-to pattern, Step-through pattern ?  ?  ?  ?General Gait Details: cues for safety and sequence ? ? ?Stairs ?Stairs: Yes ?Stairs assistance: Min assist ?Stair Management: One rail Right, Forwards, With  cane ?Number of Stairs: 2 ?General stair comments: sister present for instruction ? ? ?Wheelchair Mobility ?  ? ?Modified Rankin (Stroke Patients Only) ?  ? ? ?  ?Balance Overall balance assessment: Needs assistance ?Sitting-balance support: No upper extremity supported, Feet supported ?Sitting balance-Leahy Scale: Good ?  ?  ?Standing balance support: During functional activity, Bilateral upper extremity supported, Reliant on assistive device for balance ?Standing balance-Leahy Scale: Fair ?  ?  ?  ?  ?  ?  ?  ?  ?  ?  ?  ?  ?  ? ?  ?Cognition Arousal/Alertness: Awake/alert ?Behavior During Therapy: Impulsive ?  ?  ?  ?  ?  ?  ?  ?  ?  ?  ?  ?  ?  ?  ?  ?  ?  ?  ?  ?  ? ?  ?Exercises  ? ?  ?General Comments   ?  ?  ? ?Pertinent Vitals/Pain Pain Assessment ?Pain Score: 3  ?Pain Location: right hip ?Pain Descriptors / Indicators: Tender ?Pain Intervention(s): Premedicated before session  ? ? ?Home Living   ?  ?  ?  ?  ?  ?  ?  ?  ?  ?   ?  ?Prior Function    ?  ?  ?   ? ?PT Goals (current goals can now be found in the care plan section) Progress towards PT goals: Progressing toward goals ? ?  ?Frequency ? ? ? 7X/week ? ? ? ?  ?PT Plan Current plan remains appropriate  ? ? ?Co-evaluation   ?  ?  ?  ?  ? ?  ?  AM-PAC PT "6 Clicks" Mobility   ?Outcome Measure ? Help needed turning from your back to your side while in a flat bed without using bedrails?: A Little ?Help needed moving from lying on your back to sitting on the side of a flat bed without using bedrails?: A Little ?Help needed moving to and from a bed to a chair (including a wheelchair)?: A Little ?Help needed standing up from a chair using your arms (e.g., wheelchair or bedside chair)?: A Little ?Help needed to walk in hospital room?: A Little ?Help needed climbing 3-5 steps with a railing? : A Little ?6 Click Score: 18 ? ?  ?End of Session Equipment Utilized During Treatment: Gait belt ?Activity Tolerance: Patient tolerated treatment well ?Patient left: in  chair;with call bell/phone within reach;with chair alarm set;with family/visitor present ?Nurse Communication: Mobility status ?PT Visit Diagnosis: Unsteadiness on feet (R26.81) ?  ? ? ?Time: 1207-1228 ?PT Time Calculation (min) (ACUTE ONLY): 21 min ? ?Charges:  1 gait 8-22 mins          ?          ? ?Tresa Endo PT ?Acute Rehabilitation Services ?Pager [phone] ?Office [phone] ? ? ? ?Claretha Cooper ?01/23/2022, 12:36 PM ? ?

## 2022-01-23 NOTE — Discharge Summary (Signed)
Physician Discharge Summary  Patient ID: Debbie Houston MRN: [id number] DOB/AGE: 81-Sep-1942 81 y.o.  Admit date: 01/22/2022 Discharge date: 01/23/2022  Admission Diagnoses:  Intertrochanteric fracture of right femur, closed, with nonunion, subsequent encounter  Discharge Diagnoses:  Principal Problem:   Intertrochanteric fracture of right femur, closed, with nonunion, subsequent encounter   Past Medical History:  Diagnosis Date   Arthritis    Benign labile hypertension    Cancer (HCC)    skin cancer on face   Colitis    DJD (degenerative joint disease)    Endometrial polyp    GERD (gastroesophageal reflux disease)    History of basal cell carcinoma excision    History of esophagitis    HOH (hard of hearing)    right ear better than left  -- wears no aides   Hyperlipidemia    Hypothyroidism    Neuromuscular disorder (HCC)    neuropathy in feet   Osteopenia    Prediabetes    S/P right hip fracture 08/17/2020   Vitamin D deficiency    Wears glasses     Surgeries: Procedure(s): CONVERSION TO TOTAL HIP on 01/22/2022   Consultants (if any):   Discharged Condition: Improved  Hospital Course: Debbie Houston is an 81 y.o. female who was admitted 01/22/2022 with a diagnosis of Intertrochanteric fracture of right femur, closed, with nonunion, subsequent encounter and went to the operating room on 01/22/2022 and underwent the above named procedures.    She was given perioperative antibiotics:  Anti-infectives (From admission, onward)    Start     Dose/Rate Route Frequency Ordered Stop   01/23/22 0640  cefadroxil (DURICEF) capsule 500 mg        500 mg Oral 2 times daily 01/23/22 0641 01/30/22 0959   01/23/22 0000  cefadroxil (DURICEF) 500 MG capsule        500 mg Oral 2 times daily 01/23/22 1212 01/30/22 2359   01/22/22 2000  vancomycin (VANCOCIN) IVPB 1000 mg/200 mL premix        1,000 mg 200 mL/hr over 60 Minutes Intravenous  Once 01/22/22 1337 01/22/22 2246   01/22/22 1430   ceFAZolin (ANCEF) IVPB 1 g/50 mL premix        1 g 100 mL/hr over 30 Minutes Intravenous Every 6 hours 01/22/22 1337 01/22/22 2129   01/22/22 0715  vancomycin (VANCOCIN) IVPB 1000 mg/200 mL premix        1,000 mg 200 mL/hr over 60 Minutes Intravenous  Once 01/22/22 0704 01/22/22 0733   01/22/22 0600  ceFAZolin (ANCEF) IVPB 2g/100 mL premix        2 g 200 mL/hr over 30 Minutes Intravenous On call to O.R. 01/22/22 7846 01/22/22 0810     .  She was given sequential compression devices, early ambulation, and aspirin for DVT prophylaxis.  She benefited maximally from the hospital stay and there were no complications.    Recent vital signs:  Vitals:   01/23/22 0858 01/23/22 1055  BP: 119/73 114/78  Pulse: 79 73  Resp: 16 18  Temp: 98.1 F (36.7 C) 98.2 F (36.8 C)  SpO2: 94% 98%    Recent laboratory studies:  Lab Results  Component Value Date   HGB 9.9 (L) 01/23/2022   HGB 13.2 01/09/2022   HGB 12.8 11/21/2021   Lab Results  Component Value Date   WBC 7.4 01/23/2022   PLT 219 01/23/2022   Lab Results  Component Value Date   INR 1.0 08/06/2021   Lab Results  Component Value Date   NA 134 (L) 01/23/2022   K 3.3 (L) 01/23/2022   CL 103 01/23/2022   CO2 26 01/23/2022   BUN 13 01/23/2022   CREATININE 0.73 01/23/2022   GLUCOSE 114 (H) 01/23/2022    Discharge Medications:   Allergies as of 01/23/2022       Reactions   Fosamax [alendronate Sodium] Other (See Comments)   GI upset    Singulair [montelukast Sodium] Other (See Comments)   Makes pt jittery   Wellbutrin [bupropion] Hives   Clindamycin/lincomycin Rash   Sulfa Antibiotics Rash        Medication List     TAKE these medications    acetaminophen 500 MG tablet Commonly known as: TYLENOL Take 500 mg by mouth at bedtime as needed for moderate pain or headache.   aspirin EC 81 MG tablet Take 1 tablet (81 mg total) by mouth in the morning and at bedtime. What changed: when to take this   bisoprolol  5 MG tablet Commonly known as: ZEBETA TAKE 1/2 TABLET BY MOUTH EVERY DAY IN THE EVENING FOR BLOOD PRESSURE   CALCIUM 1200 PO Take 1,200 mg by mouth daily.   cefadroxil 500 MG capsule Commonly known as: DURICEF Take 1 capsule (500 mg total) by mouth 2 (two) times daily for 7 days.   cetirizine 10 MG tablet Commonly known as: ZYRTEC Take 10 mg by mouth at bedtime.   CINNAMON PO Take 2,000 mg by mouth daily.   colestipol 1 g tablet Commonly known as: COLESTID Take 4 g by mouth daily.   CoQ10 200 MG Caps Take 200 mg by mouth daily.   dexamethasone 2 MG tablet Commonly known as: DECADRON Take 1 tab 3 x day for 3 days, then 2 x day for  3 days, then 1 tab daily for 5 days then 1 tablet every other day   ENTYVIO IV Inject 1 Dose into the vein every 30 (thirty) days.   Fish Oil 1200 MG Caps Take 1,200 mg by mouth daily.   levothyroxine 125 MCG tablet Commonly known as: SYNTHROID TAKE 1 TABLET BY MOUTH DAILY ON AN EMPTY STOMACH WITH ONLY WATER FOR 30 MINUTES(NO ANTACIDS MEDICATIONS, CALCIUM OR MAGNESIUM FOR 4 HOURS) What changed:  how much to take how to take this when to take this additional instructions   loratadine 10 MG tablet Commonly known as: CLARITIN Take 10 mg by mouth daily as needed for allergies.   ondansetron 4 MG tablet Commonly known as: Zofran Take 1 tablet (4 mg total) by mouth every 8 (eight) hours as needed for up to 14 days for nausea or vomiting.   pantoprazole 40 MG tablet Commonly known as: Protonix Take 1 tablet  2 x /day  for Heartburn & Indigestion   Polyethyl Glycol-Propyl Glycol 0.4-0.3 % Soln Place 1 drop into both eyes at bedtime.   PROBIOTIC PO Take 1 capsule by mouth daily.   Prolia 60 MG/ML Sosy injection Generic drug: denosumab Inject 60 mg into the skin every 6 (six) months.   pseudoephedrine 120 MG 12 hr tablet Commonly known as: SUDAFED Take  1 tablet  2 x /day (every 12 hours)  for Head and Chest Congestion    simvastatin 20 MG tablet Commonly known as: ZOCOR TAKE 1 TABLET BY MOUTH AT BEDTIME FOR CHOLESTEROL What changed:  how much to take how to take this when to take this   THERA VITAMIN PO Take 1 tablet by mouth daily.   traMADol 50  MG tablet Commonly known as: Ultram Take 1 tablet (50 mg total) by mouth every 6 (six) hours as needed for up to 7 days.   vitamin C 1000 MG tablet Take 1,000 mg by mouth daily.        Diagnostic Studies: DG C-Arm 1-60 Min-No Report  Result Date: 01/22/2022 Fluoroscopy was utilized by the requesting physician.  No radiographic interpretation.   DG C-Arm 1-60 Min-No Report  Result Date: 01/22/2022 Fluoroscopy was utilized by the requesting physician.  No radiographic interpretation.   DG C-Arm 1-60 Min-No Report  Result Date: 01/22/2022 Fluoroscopy was utilized by the requesting physician.  No radiographic interpretation.   DG HIP UNILAT WITH PELVIS 1V RIGHT  Result Date: 01/22/2022 CLINICAL DATA:  Fluoroscopic assistance for right hip arthroplasty EXAM: DG HIP (WITH OR WITHOUT PELVIS) 1V RIGHT COMPARISON:  08/23/2021 FINDINGS: Fluoroscopic images show interval removal of intramedullary rod and arthroplasty in the right hip. No fracture is seen. Fluoroscopic time was 14 seconds. Estimated radiation dose is 1.8 mGy. IMPRESSION: Fluoroscopic assistance was provided for right hip arthroplasty. Electronically Signed   By: Ernie Avena M.D.   On: 01/22/2022 10:56   DG HIP UNILAT W OR W/O PELVIS 2-3 VIEWS RIGHT  Result Date: 01/22/2022 CLINICAL DATA:  Right hip arthroplasty EXAM: DG HIP (WITH OR WITHOUT PELVIS) 2-3V RIGHT COMPARISON:  08/23/2021 FINDINGS: Interval postsurgical changes from right total hip arthroplasty. Arthroplasty components are in their expected alignment. No periprosthetic fracture or evidence of other complication. Expected postoperative changes within the overlying soft tissues. IMPRESSION: Satisfactory postoperative appearance  status post right total hip arthroplasty. Electronically Signed   By: Duanne Guess D.O.   On: 01/22/2022 12:56    Disposition: Discharge disposition: 01-Home or Self Care       Discharge Instructions     Call MD / Call 911   Complete by: As directed    If you experience chest pain or shortness of breath, CALL 911 and be transported to the hospital emergency room.  If you develope a fever above 101 F, pus (white drainage) or increased drainage or redness at the wound, or calf pain, call your surgeon's office.   Constipation Prevention   Complete by: As directed    Drink plenty of fluids.  Prune juice may be helpful.  You may use a stool softener, such as Colace (over the counter) 100 mg twice a day.  Use MiraLax (over the counter) for constipation as needed.   Diet - low sodium heart healthy   Complete by: As directed    Follow the hip precautions as taught in Physical Therapy   Complete by: As directed    Increase activity slowly as tolerated   Complete by: As directed    Post-operative opioid taper instructions:   Complete by: As directed    POST-OPERATIVE OPIOID TAPER INSTRUCTIONS: It is important to wean off of your opioid medication as soon as possible. If you do not need pain medication after your surgery it is ok to stop day one. Opioids include: Codeine, Hydrocodone(Norco, Vicodin), Oxycodone(Percocet, oxycontin) and hydromorphone amongst others.  Long term and even short term use of opiods can cause: Increased pain response Dependence Constipation Depression Respiratory depression And more.  Withdrawal symptoms can include Flu like symptoms Nausea, vomiting And more Techniques to manage these symptoms Hydrate well Eat regular healthy meals Stay active Use relaxation techniques(deep breathing, meditating, yoga) Do Not substitute Alcohol to help with tapering If you have been on opioids for less than  two weeks and do not have pain than it is ok to stop all  together.  Plan to wean off of opioids This plan should start within one week post op of your joint replacement. Maintain the same interval or time between taking each dose and first decrease the dose.  Cut the total daily intake of opioids by one tablet each day Next start to increase the time between doses. The last dose that should be eliminated is the evening dose.           Follow-up Information     Joen Laura, MD. Go on 02/08/2022.   Specialty: Orthopedic Surgery Why: Your appointment is scheduled for 3:45 Contact information: [address] [place] [address] [phone]         [facility] Follow up.   Specialty: Home Health Services Why: HHPT will provide 6 home visits prior to starting outpatient physical therpy Contact information: [address] [place] [address] [phone]         [facility], [place]. Go on 02/08/2022.   Why: Your outpatient physical therapy appointment is at 2:30. Please arrive at 2:15 to complete your paperwork. You will see the MD after your therapy session Contact information: [facility] [address] [place] [address] [phone]                    Discharge Instructions      INSTRUCTIONS AFTER JOINT REPLACEMENT   Remove items at home which could result in a fall. This includes throw rugs or furniture in walking pathways ICE to the affected joint every three hours while awake for 30 minutes at a time, for at least the first 3-5 days, and then as needed for pain and swelling.  Continue to use ice for pain and swelling. You may notice swelling that will progress down to the foot and ankle.  This is normal after surgery.  Elevate your leg when you are not up walking on it.   Continue to use the breathing machine you got in the hospital (incentive spirometer) which will help keep your temperature down.  It is common for your  temperature to cycle up and down following surgery, especially at night when you are not up moving around and exerting yourself.  The breathing machine keeps your lungs expanded and your temperature down.   DIET:  As you were doing prior to hospitalization, we recommend a well-balanced diet.  DRESSING / WOUND CARE / SHOWERING  Keep the surgical dressing until follow up.  The dressing is water proof, so you can shower without any extra covering.  IF THE DRESSING FALLS OFF or the wound gets wet inside, change the dressing with sterile gauze.  Please use good hand washing techniques before changing the dressing.  Do not use any lotions or creams on the incision until instructed by your surgeon.    ACTIVITY  Increase activity slowly as tolerated, but follow the weight bearing instructions below.   No driving for 6 weeks or until further direction given by your physician.  You cannot drive while taking narcotics.  No lifting or carrying greater than 10 lbs. until further directed by your surgeon. Avoid periods of inactivity such as sitting longer than an hour when not asleep. This helps prevent blood clots.  You may return to work once you are authorized by your doctor.     WEIGHT BEARING   Weight bearing  as tolerated with assist device (Parenteau, cane, etc) as directed, use it as long as suggested by your surgeon or therapist, typically at least 4-6 weeks.   EXERCISES  Results after joint replacement surgery are often greatly improved when you follow the exercise, range of motion and muscle strengthening exercises prescribed by your doctor. Safety measures are also important to protect the joint from further injury. Any time any of these exercises cause you to have increased pain or swelling, decrease what you are doing until you are comfortable again and then slowly increase them. If you have problems or questions, call your caregiver or physical therapist for advice.   Rehabilitation is  important following a joint replacement. After just a few days of immobilization, the muscles of the leg can become weakened and shrink (atrophy).  These exercises are designed to build up the tone and strength of the thigh and leg muscles and to improve motion. Often times heat used for twenty to thirty minutes before working out will loosen up your tissues and help with improving the range of motion but do not use heat for the first two weeks following surgery (sometimes heat can increase post-operative swelling).   These exercises can be done on a training (exercise) mat, on the floor, on a table or on a bed. Use whatever works the best and is most comfortable for you.    Use music or television while you are exercising so that the exercises are a pleasant break in your day. This will make your life better with the exercises acting as a break in your routine that you can look forward to.   Perform all exercises about fifteen times, three times per day or as directed.  You should exercise both the operative leg and the other leg as well.  Exercises include:   Quad Sets - Tighten up the muscle on the front of the thigh (Quad) and hold for 5-10 seconds.   Straight Leg Raises - With your knee straight (if you were given a brace, keep it on), lift the leg to 60 degrees, hold for 3 seconds, and slowly lower the leg.  Perform this exercise against resistance later as your leg gets stronger.  Leg Slides: Lying on your back, slowly slide your foot toward your buttocks, bending your knee up off the floor (only go as far as is comfortable). Then slowly slide your foot back down until your leg is flat on the floor again.  Angel Wings: Lying on your back spread your legs to the side as far apart as you can without causing discomfort.  Hamstring Strength:  Lying on your back, push your heel against the floor with your leg straight by tightening up the muscles of your buttocks.  Repeat, but this time bend your knee to  a comfortable angle, and push your heel against the floor.  You may put a pillow under the heel to make it more comfortable if necessary.   A rehabilitation program following joint replacement surgery can speed recovery and prevent re-injury in the future due to weakened muscles. Contact your doctor or a physical therapist for more information on knee rehabilitation.    CONSTIPATION  Constipation is defined medically as fewer than three stools per week and severe constipation as less than one stool per week.  Even if you have a regular bowel pattern at home, your normal regimen is likely to be disrupted due to multiple reasons following surgery.  Combination of anesthesia, postoperative narcotics, change  in appetite and fluid intake all can affect your bowels.   YOU MUST use at least one of the following options; they are listed in order of increasing strength to get the job done.  They are all available over the counter, and you may need to use some, POSSIBLY even all of these options:    Drink plenty of fluids (prune juice may be helpful) and high fiber foods Colace 100 mg by mouth twice a day  Senokot for constipation as directed and as needed Dulcolax (bisacodyl), take with full glass of water  Miralax (polyethylene glycol) once or twice a day as needed.  If you have tried all these things and are unable to have a bowel movement in the first 3-4 days after surgery call either your surgeon or your primary doctor.    If you experience loose stools or diarrhea, hold the medications until you stool forms back up.  If your symptoms do not get better within 1 week or if they get worse, check with your doctor.  If you experience "the worst abdominal pain ever" or develop nausea or vomiting, please contact the office immediately for further recommendations for treatment.   ITCHING:  If you experience itching with your medications, try taking only a single pain pill, or even half a pain pill at a  time.  You can also use Benadryl over the counter for itching or also to help with sleep.   TED HOSE STOCKINGS:  Use stockings on both legs until for at least 2 weeks or as directed by physician office. They may be removed at night for sleeping.  MEDICATIONS:  See your medication summary on the After Visit Summary that nursing will review with you.  You may have some home medications which will be placed on hold until you complete the course of blood thinner medication.  It is important for you to complete the blood thinner medication as prescribed.   Blood clot prevention (DVT Prophylaxis): After surgery you are at an increased risk for a blood clot. you were prescribed a blood thinner, Aspirin 81mg , to be taken twice daily for a total of 4 weeks from surgery to help reduce your risk of getting a blood clot. This will help prevent a blood clot. Signs of a pulmonary embolus (blood clot in the lungs) include sudden short of breath, feeling lightheaded or dizzy, chest pain with a deep breath, rapid pulse rapid breathing. Signs of a blood clot in your arms or legs include new unexplained swelling and cramping, warm, red or darkened skin around the painful area. Please call the office or 911 right away if these signs or symptoms develop.  PRECAUTIONS:  If you experience chest pain or shortness of breath - call 911 immediately for transfer to the hospital emergency department.   If you develop a fever greater that 101 F, purulent drainage from wound, increased redness or drainage from wound, foul odor from the wound/dressing, or calf pain - CONTACT YOUR SURGEON.                                                   FOLLOW-UP APPOINTMENTS:  If you do not already have a post-op appointment, please call the office for an appointment to be seen by your surgeon.  Guidelines for how soon to be seen are listed in your  After Visit Summary, but are typically between 2-3 weeks after surgery.  OTHER INSTRUCTIONS:     POST-OPERATIVE OPIOID TAPER INSTRUCTIONS: It is important to wean off of your opioid medication as soon as possible. If you do not need pain medication after your surgery it is ok to stop day one. Opioids include: Codeine, Hydrocodone(Norco, Vicodin), Oxycodone(Percocet, oxycontin) and hydromorphone amongst others.  Long term and even short term use of opiods can cause: Increased pain response Dependence Constipation Depression Respiratory depression And more.  Withdrawal symptoms can include Flu like symptoms Nausea, vomiting And more Techniques to manage these symptoms Hydrate well Eat regular healthy meals Stay active Use relaxation techniques(deep breathing, meditating, yoga) Do Not substitute Alcohol to help with tapering If you have been on opioids for less than two weeks and do not have pain than it is ok to stop all together.  Plan to wean off of opioids This plan should start within one week post op of your joint replacement. Maintain the same interval or time between taking each dose and first decrease the dose.  Cut the total daily intake of opioids by one tablet each day Next start to increase the time between doses. The last dose that should be eliminated is the evening dose.   MAKE SURE YOU:  Understand these instructions.  Get help right away if you are not doing well or get worse.    Thank you for letting us be a part of your medical care team.  It is a privilege we respect greatly.  We hope these instructions will help you stay on track for a fast and full recovery!            Signed: Ameirah Khatoon A Autry Prust 01/23/2022, 12:13 PM

## 2022-01-23 NOTE — Discharge Instructions (Signed)
INSTRUCTIONS AFTER JOINT REPLACEMENT   Remove items at home which could result in a fall. This includes throw rugs or furniture in walking pathways ICE to the affected joint every three hours while awake for 30 minutes at a time, for at least the first 3-5 days, and then as needed for pain and swelling.  Continue to use ice for pain and swelling. You may notice swelling that will progress down to the foot and ankle.  This is normal after surgery.  Elevate your leg when you are not up walking on it.   Continue to use the breathing machine you got in the hospital (incentive spirometer) which will help keep your temperature down.  It is common for your temperature to cycle up and down following surgery, especially at night when you are not up moving around and exerting yourself.  The breathing machine keeps your lungs expanded and your temperature down.   DIET:  As you were doing prior to hospitalization, we recommend a well-balanced diet.  DRESSING / WOUND CARE / SHOWERING  Keep the surgical dressing until follow up.  The dressing is water proof, so you can shower without any extra covering.  IF THE DRESSING FALLS OFF or the wound gets wet inside, change the dressing with sterile gauze.  Please use good hand washing techniques before changing the dressing.  Do not use any lotions or creams on the incision until instructed by your surgeon.    ACTIVITY  Increase activity slowly as tolerated, but follow the weight bearing instructions below.   No driving for 6 weeks or until further direction given by your physician.  You cannot drive while taking narcotics.  No lifting or carrying greater than 10 lbs. until further directed by your surgeon. Avoid periods of inactivity such as sitting longer than an hour when not asleep. This helps prevent blood clots.  You may return to work once you are authorized by your doctor.     WEIGHT BEARING   Weight bearing as tolerated with assist device ([place], cane,  etc) as directed, use it as long as suggested by your surgeon or therapist, typically at least 4-6 weeks.   EXERCISES  Results after joint replacement surgery are often greatly improved when you follow the exercise, range of motion and muscle strengthening exercises prescribed by your doctor. Safety measures are also important to protect the joint from further injury. Any time any of these exercises cause you to have increased pain or swelling, decrease what you are doing until you are comfortable again and then slowly increase them. If you have problems or questions, call your caregiver or physical therapist for advice.   Rehabilitation is important following a joint replacement. After just a few days of immobilization, the muscles of the leg can become weakened and shrink (atrophy).  These exercises are designed to build up the tone and strength of the thigh and leg muscles and to improve motion. Often times heat used for twenty to thirty minutes before working out will loosen up your tissues and help with improving the range of motion but do not use heat for the first two weeks following surgery (sometimes heat can increase post-operative swelling).   These exercises can be done on a training (exercise) mat, on the floor, on a table or on a bed. Use whatever works the best and is most comfortable for you.    Use music or television while you are exercising so that the exercises are a pleasant break in your  day. This will make your life better with the exercises acting as a break in your routine that you can look forward to.   Perform all exercises about fifteen times, three times per day or as directed.  You should exercise both the operative leg and the other leg as well.  Exercises include:   Quad Sets - Tighten up the muscle on the front of the thigh (Quad) and hold for 5-10 seconds.   Straight Leg Raises - With your knee straight (if you were given a brace, keep it on), lift the leg to 60  degrees, hold for 3 seconds, and slowly lower the leg.  Perform this exercise against resistance later as your leg gets stronger.  Leg Slides: Lying on your back, slowly slide your foot toward your buttocks, bending your knee up off the floor (only go as far as is comfortable). Then slowly slide your foot back down until your leg is flat on the floor again.  Angel Wings: Lying on your back spread your legs to the side as far apart as you can without causing discomfort.  Hamstring Strength:  Lying on your back, push your heel against the floor with your leg straight by tightening up the muscles of your buttocks.  Repeat, but this time bend your knee to a comfortable angle, and push your heel against the floor.  You may put a pillow under the heel to make it more comfortable if necessary.   A rehabilitation program following joint replacement surgery can speed recovery and prevent re-injury in the future due to weakened muscles. Contact your doctor or a physical therapist for more information on knee rehabilitation.    CONSTIPATION  Constipation is defined medically as fewer than three stools per week and severe constipation as less than one stool per week.  Even if you have a regular bowel pattern at home, your normal regimen is likely to be disrupted due to multiple reasons following surgery.  Combination of anesthesia, postoperative narcotics, change in appetite and fluid intake all can affect your bowels.   YOU MUST use at least one of the following options; they are listed in order of increasing strength to get the job done.  They are all available over the counter, and you may need to use some, POSSIBLY even all of these options:    Drink plenty of fluids (prune juice may be helpful) and high fiber foods Colace 100 mg by mouth twice a day  Senokot for constipation as directed and as needed Dulcolax (bisacodyl), take with full glass of water  Miralax (polyethylene glycol) once or twice a day as  needed.  If you have tried all these things and are unable to have a bowel movement in the first 3-4 days after surgery call either your surgeon or your primary doctor.    If you experience loose stools or diarrhea, hold the medications until you stool forms back up.  If your symptoms do not get better within 1 week or if they get worse, check with your doctor.  If you experience "the worst abdominal pain ever" or develop nausea or vomiting, please contact the office immediately for further recommendations for treatment.   ITCHING:  If you experience itching with your medications, try taking only a single pain pill, or even half a pain pill at a time.  You can also use Benadryl over the counter for itching or also to help with sleep.   TED HOSE STOCKINGS:  Use stockings on both  legs until for at least 2 weeks or as directed by physician office. They may be removed at night for sleeping.  MEDICATIONS:  See your medication summary on the After Visit Summary that nursing will review with you.  You may have some home medications which will be placed on hold until you complete the course of blood thinner medication.  It is important for you to complete the blood thinner medication as prescribed.   Blood clot prevention (DVT Prophylaxis): After surgery you are at an increased risk for a blood clot. you were prescribed a blood thinner, Aspirin 72m, to be taken twice daily for a total of 4 weeks from surgery to help reduce your risk of getting a blood clot. This will help prevent a blood clot. Signs of a pulmonary embolus (blood clot in the lungs) include sudden short of breath, feeling lightheaded or dizzy, chest pain with a deep breath, rapid pulse rapid breathing. Signs of a blood clot in your arms or legs include new unexplained swelling and cramping, warm, red or darkened skin around the painful area. Please call the office or 911 right away if these signs or symptoms develop.  PRECAUTIONS:  If you  experience chest pain or shortness of breath - call 911 immediately for transfer to the hospital emergency department.   If you develop a fever greater that 101 F, purulent drainage from wound, increased redness or drainage from wound, foul odor from the wound/dressing, or calf pain - CONTACT YOUR SURGEON.                                                   FOLLOW-UP APPOINTMENTS:  If you do not already have a post-op appointment, please call the office for an appointment to be seen by your surgeon.  Guidelines for how soon to be seen are listed in your After Visit Summary, but are typically between 2-3 weeks after surgery.  OTHER INSTRUCTIONS:    POST-OPERATIVE OPIOID TAPER INSTRUCTIONS: It is important to wean off of your opioid medication as soon as possible. If you do not need pain medication after your surgery it is ok to stop day one. Opioids include: Codeine, Hydrocodone(Norco, Vicodin), Oxycodone(Percocet, oxycontin) and hydromorphone amongst others.  Long term and even short term use of opiods can cause: Increased pain response Dependence Constipation Depression Respiratory depression And more.  Withdrawal symptoms can include Flu like symptoms Nausea, vomiting And more Techniques to manage these symptoms Hydrate well Eat regular healthy meals Stay active Use relaxation techniques(deep breathing, meditating, yoga) Do Not substitute Alcohol to help with tapering If you have been on opioids for less than two weeks and do not have pain than it is ok to stop all together.  Plan to wean off of opioids This plan should start within one week post op of your joint replacement. Maintain the same interval or time between taking each dose and first decrease the dose.  Cut the total daily intake of opioids by one tablet each day Next start to increase the time between doses. The last dose that should be eliminated is the evening dose.   MAKE SURE YOU:  Understand these  instructions.  Get help right away if you are not doing well or get worse.    Thank you for letting [facility] a part of your medical care team.  It is a privilege we respect greatly.  We hope these instructions will help you stay on track for a fast and full recovery!

## 2022-01-23 NOTE — Progress Notes (Signed)
Physical Therapy Treatment ?Patient Details ?Name: Debbie [place] ?MRN: [id number] ?DOB: 05/28/1941 ?Today's Date: 01/23/2022 ? ? ?History of Present Illness 81 YO female S/P removal right femor hardware for Nonunion fracture since 2021, S/P and posterior THA on 01/22/22. ? ?  ?PT Comments  ? ? Patient requires frequent cue for posterior hip precautions and safety, stands without RW. Patient progressing. Will see again for step instruction with sister.   ?Recommendations for follow up therapy are one component of a multi-disciplinary discharge planning process, led by the attending physician.  Recommendations may be updated based on patient status, additional functional criteria and insurance authorization. ? ?Follow Up Recommendations ? Follow physician's recommendations for discharge plan and follow up therapies ?  ?  ?Assistance Recommended at Discharge Frequent or constant Supervision/Assistance  ?Patient can return home with the following A little help with walking and/or transfers;Assistance with cooking/housework;Assist for transportation;Help with stairs or ramp for entrance;A little help with bathing/dressing/bathroom ?  ?Equipment Recommendations ?    ?  ?Recommendations for Other Services   ? ? ?  ?Precautions / Restrictions Precautions ?Precautions: Posterior Hip;Fall  ?  ? ?Mobility ? Bed Mobility ?  ?  ?  ?  ?  ?  ?  ?General bed mobility comments: in recliner ?  ? ?Transfers ?Overall transfer level: Needs assistance ?Equipment used: Rolling [place] (2 wheels) ?Transfers: Sit to/from Stand ?Sit to Stand: Min guard ?  ?  ?  ?  ?  ?General transfer comment: cues for safety, stands when RW not near her ?  ? ?Ambulation/Gait ?Ambulation/Gait assistance: Min guard ?Gait Distance (Feet): 80 Feet ?Assistive device: Rolling Briseno (2 wheels) ?Gait Pattern/deviations: Step-to pattern, Step-through pattern ?  ?  ?  ?General Gait Details: cues for safety and sequence ? ? ?Stairs ?Stairs: Yes ?Stairs assistance: Min  assist ?Stair Management: One rail Right, Forwards, With cane ?Number of Stairs: 5 ?General stair comments: multimodal cues for safety ? ? ?Wheelchair Mobility ?  ? ?Modified Rankin (Stroke Patients Only) ?  ? ? ?  ?Balance Overall balance assessment: Needs assistance ?Sitting-balance support: No upper extremity supported, Feet supported ?Sitting balance-Leahy Scale: Good ?  ?  ?Standing balance support: During functional activity, Bilateral upper extremity supported, Reliant on assistive device for balance ?Standing balance-Leahy Scale: Fair ?  ?  ?  ?  ?  ?  ?  ?  ?  ?  ?  ?  ?  ? ?  ?Cognition Arousal/Alertness: Awake/alert ?Behavior During Therapy: Impulsive ?  ?  ?  ?  ?  ?  ?  ?  ?  ?  ?  ?  ?  ?  ?  ?  ?  ?  ?  ?  ? ?  ?Exercises Total Joint Exercises ?Ankle Circles/Pumps: AROM, Both, 10 reps ?Quad Sets: AROM, Both, 10 reps ?Short Arc Quad: AROM, Right, 10 reps ?Heel Slides: AROM, Right, 10 reps ?Hip ABduction/ADduction: AROM, Right, 10 reps ?Long Arc Quad: AROM, Right, 10 reps ? ?  ?General Comments   ?  ?  ? ?Pertinent Vitals/Pain Pain Assessment ?Pain Score: 3  ?Pain Location: right hip ?Pain Descriptors / Indicators: Tender ?Pain Intervention(s): Monitored during session, Premedicated before session  ? ? ?Home Living   ?  ?  ?  ?  ?  ?  ?  ?  ?  ?   ?  ?Prior Function    ?  ?  ?   ? ?PT Goals (current goals can now  be found in the care plan section) Progress towards PT goals: Progressing toward goals ? ?  ?Frequency ? ? ? 7X/week ? ? ? ?  ?PT Plan Current plan remains appropriate  ? ? ?Co-evaluation   ?  ?  ?  ?  ? ?  ?AM-PAC PT "6 Clicks" Mobility   ?Outcome Measure ? Help needed turning from your back to your side while in a flat bed without using bedrails?: A Little ?Help needed moving from lying on your back to sitting on the side of a flat bed without using bedrails?: A Little ?Help needed moving to and from a bed to a chair (including a wheelchair)?: A Little ?Help needed standing up from a chair  using your arms (e.g., wheelchair or bedside chair)?: A Little ?Help needed to walk in hospital room?: A Little ?Help needed climbing 3-5 steps with a railing? : A Little ?6 Click Score: 18 ? ?  ?End of Session Equipment Utilized During Treatment: Gait belt ?Activity Tolerance: Patient tolerated treatment well ?Patient left: in chair;with call bell/phone within reach;with chair alarm set ?Nurse Communication: Mobility status ?PT Visit Diagnosis: Unsteadiness on feet (R26.81) ?  ? ? ?Time: 9470-7615 ?PT Time Calculation (min) (ACUTE ONLY): 47 min ? ?Charges:  $Gait Training: 8-22 mins ?$Therapeutic Exercise: 8-22 mins ?$Self Care/Home Management: 8-22          ?          ? ?Tresa Endo PT ?Acute Rehabilitation Services ?Pager [phone] ?Office [phone] ? ? ? ?[place]bie [place] ?01/23/2022, 12:33 PM ? ?

## 2022-01-23 NOTE — Progress Notes (Signed)
? ? ? ?  Subjective: ? ?Patient reports pain as mild.  Denies distal n/t. Worked well with PT yesterday ambulating 20 feet. Eager to go home once clear by PT. ? ?Objective:  ? ?VITALS:   ?Vitals:  ? 01/22/22 1342 01/22/22 2138 01/23/22 0151 01/23/22 0418  ?BP: 128/68 117/71 126/70 121/76  ?Pulse: 61 81 77 87  ?Resp: _0 ?Temp: 98 ?F (36.7 ?C) 98.7 ?F (37.1 ?C) 99 ?F (37.2 ?C) 99.1 ?F (37.3 ?C)  ?TempSrc: Oral Oral    ?SpO2: 98% 97% 96% 95%  ?Weight:      ?Height:      ? ? ?Sensation intact distally ?Intact pulses distally ?Dorsiflexion/Plantar flexion intact ?Incision: dressing C/D/I ?No cellulitis present ?Compartment soft ? ? ?Lab Results  ?Component Value Date  ? WBC 7.4 01/23/2022  ? HGB 9.9 (L) 01/23/2022  ? HCT 29.8 (L) 01/23/2022  ? MCV 99.0 01/23/2022  ? PLT 219 01/23/2022  ? ?BMET ?   ?Component Value Date/Time  ? NA 134 (L) 01/23/2022 0319  ? K 3.3 (L) 01/23/2022 0319  ? CL 103 01/23/2022 0319  ? CO2 26 01/23/2022 0319  ? GLUCOSE 114 (H) 01/23/2022 0319  ? BUN 13 01/23/2022 0319  ? CREATININE 0.73 01/23/2022 0319  ? CREATININE 0.78 11/21/2021 1021  ? CALCIUM 8.2 (L) 01/23/2022 0319  ? EGFR 77 11/21/2021 1021  ? GFRNONAA >60 01/23/2022 0319  ? GFRNONAA 75 05/15/2021 1513  ? ? ? ? ?Xray: post op xrays show total hip implants in good position without adverse features ? ?Assessment/Plan: ?1 Day Post-Op  ? ?Principal Problem: ?  Intertrochanteric fracture of right femur, closed, with nonunion, subsequent encounter ? ?S/p R hip removal of hardware and conversion THA ? ?Post op recs: ?WB: WBAT RLE, posterior hip precautions x6 weeks ?Abx: Vanc + ancef x23 hours post op, followed by cefadroxil 500 BID x7 days given hx of prior surgery ?Imaging: PACU pelvis Xray ?Dressing: Aquacell, keep intact until follow up ?DVT prophylaxis: Aspirin 81BID starting POD1 ?Follow up: 2 weeks after surgery for a wound check with Dr. Zachery Dakins at [facility].  ?Address: [address] [place], [place],  [place] [address]  ?Office Phone: [phone] ? ? ? ?Kenia Teagarden A Khamani Daniely ?01/23/2022, 6:42 AM ? ? ?Charlies Constable, MD ? ?Contact information:   ?Weekdays 7am-5pm epic message Dr. Zachery Dakins, or call office for patient follow up: [phone] ?After hours and holidays please check [email or web address] for group call information for [facility] ? ?  ?

## 2022-01-24 ENCOUNTER — Telehealth: Payer: Self-pay

## 2022-01-24 DIAGNOSIS — K529 Noninfective gastroenteritis and colitis, unspecified: Secondary | ICD-10-CM | POA: Diagnosis not present

## 2022-01-24 DIAGNOSIS — K219 Gastro-esophageal reflux disease without esophagitis: Secondary | ICD-10-CM | POA: Diagnosis not present

## 2022-01-24 DIAGNOSIS — E559 Vitamin D deficiency, unspecified: Secondary | ICD-10-CM | POA: Diagnosis not present

## 2022-01-24 DIAGNOSIS — I4510 Unspecified right bundle-branch block: Secondary | ICD-10-CM | POA: Diagnosis not present

## 2022-01-24 DIAGNOSIS — M8580 Other specified disorders of bone density and structure, unspecified site: Secondary | ICD-10-CM | POA: Diagnosis not present

## 2022-01-24 DIAGNOSIS — I10 Essential (primary) hypertension: Secondary | ICD-10-CM | POA: Diagnosis not present

## 2022-01-24 DIAGNOSIS — R7303 Prediabetes: Secondary | ICD-10-CM | POA: Diagnosis not present

## 2022-01-24 DIAGNOSIS — H9193 Unspecified hearing loss, bilateral: Secondary | ICD-10-CM | POA: Diagnosis not present

## 2022-01-24 DIAGNOSIS — M810 Age-related osteoporosis without current pathological fracture: Secondary | ICD-10-CM | POA: Diagnosis not present

## 2022-01-24 DIAGNOSIS — Z471 Aftercare following joint replacement surgery: Secondary | ICD-10-CM | POA: Diagnosis not present

## 2022-01-24 DIAGNOSIS — M4316 Spondylolisthesis, lumbar region: Secondary | ICD-10-CM | POA: Diagnosis not present

## 2022-01-24 DIAGNOSIS — Z9089 Acquired absence of other organs: Secondary | ICD-10-CM | POA: Diagnosis not present

## 2022-01-24 DIAGNOSIS — Z79899 Other long term (current) drug therapy: Secondary | ICD-10-CM | POA: Diagnosis not present

## 2022-01-24 DIAGNOSIS — K5190 Ulcerative colitis, unspecified, without complications: Secondary | ICD-10-CM | POA: Diagnosis not present

## 2022-01-24 DIAGNOSIS — N840 Polyp of corpus uteri: Secondary | ICD-10-CM | POA: Diagnosis not present

## 2022-01-24 DIAGNOSIS — Z7982 Long term (current) use of aspirin: Secondary | ICD-10-CM | POA: Diagnosis not present

## 2022-01-24 DIAGNOSIS — Z96641 Presence of right artificial hip joint: Secondary | ICD-10-CM | POA: Diagnosis not present

## 2022-01-24 DIAGNOSIS — Z87891 Personal history of nicotine dependence: Secondary | ICD-10-CM | POA: Diagnosis not present

## 2022-01-24 DIAGNOSIS — D692 Other nonthrombocytopenic purpura: Secondary | ICD-10-CM | POA: Diagnosis not present

## 2022-01-24 DIAGNOSIS — E039 Hypothyroidism, unspecified: Secondary | ICD-10-CM | POA: Diagnosis not present

## 2022-01-24 DIAGNOSIS — J45909 Unspecified asthma, uncomplicated: Secondary | ICD-10-CM | POA: Diagnosis not present

## 2022-01-24 DIAGNOSIS — E782 Mixed hyperlipidemia: Secondary | ICD-10-CM | POA: Diagnosis not present

## 2022-01-24 DIAGNOSIS — Z85828 Personal history of other malignant neoplasm of skin: Secondary | ICD-10-CM | POA: Diagnosis not present

## 2022-01-24 DIAGNOSIS — K209 Esophagitis, unspecified without bleeding: Secondary | ICD-10-CM | POA: Diagnosis not present

## 2022-01-24 NOTE — Telephone Encounter (Signed)
LM-01/24/22-Called patient for Hospital discharge asessment.  ? ?Total time spent: 7 Minutes ?Vanetta Shawl, [facility] ?

## 2022-01-25 NOTE — Progress Notes (Unsigned)
Office Visit Note  Patient: Debbie Houston             Date of Birth: [birth year]           MRN: [id number]             PCP: Unk Pinto, MD Referring: Unk Pinto, MD Visit Date: 02/08/2022 Occupation: @GUAROCC @  Subjective:  No chief complaint on file.   History of Present Illness: Debbie Houston is a 81 y.o. female ***   Activities of Daily Living:  Patient reports morning stiffness for *** {minute/hour:19697}.   Patient {ACTIONS;DENIES/REPORTS:21021675::"Denies"} nocturnal pain.  Difficulty dressing/grooming: {ACTIONS;DENIES/REPORTS:21021675::"Denies"} Difficulty climbing stairs: {ACTIONS;DENIES/REPORTS:21021675::"Denies"} Difficulty getting out of chair: {ACTIONS;DENIES/REPORTS:21021675::"Denies"} Difficulty using hands for taps, buttons, cutlery, and/or writing: {ACTIONS;DENIES/REPORTS:21021675::"Denies"}  No Rheumatology ROS completed.   PMFS History:  Patient Active Problem List   Diagnosis Date Noted   Intertrochanteric fracture of right femur, closed, with nonunion, subsequent encounter 01/22/2022   History of ulcerative colitis 07/10/2021   Complete right bundle branch block (RBBB) 05/15/2021   Body mass index (BMI) of 19.0-19.9 in adult 05/12/2021   Bilateral nephrolithiasis 09/21/2020   Senile purpura ([place]) 08/17/2020   Bilateral temporomandibular joint pain 06/15/2020   Vertigo 03/25/2019   Spondylolisthesis at L4-L5 level 11/25/2018   Colitis 12/21/2014   Medication management 04/20/2014   Essential hypertension    Hyperlipidemia, mixed    Hypothyroid    GERD    Asthma    DJD (degenerative joint disease)    Vitamin D deficiency    Osteoporosis with current pathological fracture    Allergy     Past Medical History:  Diagnosis Date   Arthritis    Benign labile hypertension    Cancer ([place])    skin cancer on face   Colitis    DJD (degenerative joint disease)    Endometrial polyp    GERD (gastroesophageal reflux disease)    History of  basal cell carcinoma excision    History of esophagitis    HOH (hard of hearing)    right ear better than left  -- wears no aides   Hyperlipidemia    Hypothyroidism    Neuromuscular disorder ([place])    neuropathy in feet   Osteopenia    Prediabetes    S/P right hip fracture 08/17/2020   Vitamin D deficiency    Wears glasses     Family History  Problem Relation Age of Onset   Hypertension Mother    Cancer Mother        COLON   Hyperlipidemia Mother    Cancer Father        BREAST WITH BRAIN METS   Hyperlipidemia Father    Cancer Sister        COLON (TEENS), BREAST (35)   Hyperlipidemia Sister    Diabetes Brother    Hyperlipidemia Brother    Hypertension Brother    Colon cancer Brother 22   Cancer Maternal Grandmother        RENAL   Hyperlipidemia Maternal Grandmother    Diabetes Maternal Grandfather    Stroke Maternal Grandfather    Hyperlipidemia Maternal Grandfather    Hyperlipidemia Paternal Grandmother    Hyperlipidemia Paternal Grandfather    Autism Son    Colitis Son    Autism spectrum disorder Son    Healthy Son    Past Surgical History:  Procedure Laterality Date   BACK SURGERY  2020   CATARACT EXTRACTION, BILATERAL Bilateral 2020   CONVERSION TO TOTAL HIP Right  01/22/2022   Procedure: CONVERSION TO TOTAL HIP;  Surgeon: Willaim Sheng, MD;  Location: [facility];  Service: Orthopedics;  Laterality: Right;   DILATATION & CURETTAGE/HYSTEROSCOPY WITH MYOSURE N/A 10/27/2014   Procedure: DILATATION & CURETTAGE/HYSTEROSCOPY WITH MYOSURE;  Surgeon: Darlyn Chamber, MD;  Location: [facility];  Service: Gynecology;  Laterality: N/A;   DILATION AND CURETTAGE OF UTERUS     HIP SURGERY Right 04/2020   rod and screw in right hip    MOHS SURGERY  2005   LEFT NASAL BRIDGE FOR BASAL CELL   TONSILLECTOMY  age 89   Social History   Social History Narrative   Not on file   Immunization History  Administered Date(s) Administered   Influenza, High Dose  Seasonal PF 07/23/2014, 08/23/2015, 07/10/2016, 08/27/2017, 08/22/2018, 08/25/2019, 08/30/2020, 08/21/2021   PFIZER(Purple Top)SARS-COV-2 Vaccination 12/10/2019, 12/31/2019, 08/16/2020, 04/14/2021   Pneumococcal Conjugate-13 05/05/2015   Pneumococcal-Unspecified 08/22/2011   Tdap 08/22/2011   Zoster Recombinat (Shingrix) 09/13/2017, 01/01/2018   Zoster, Live 06/20/2006     Objective: Vital Signs: There were no vitals taken for this visit.   Physical Exam   Musculoskeletal Exam: ***  CDAI Exam: CDAI Score: -- Patient Global: --; Provider Global: -- Swollen: --; Tender: -- Joint Exam 02/08/2022   No joint exam has been documented for this visit   There is currently no information documented on the homunculus. Go to the Rheumatology activity and complete the homunculus joint exam.  Investigation: No additional findings.  Imaging: DG C-Arm 1-60 Min-No Report  Result Date: 01/22/2022 Fluoroscopy was utilized by the requesting physician.  No radiographic interpretation.   DG C-Arm 1-60 Min-No Report  Result Date: 01/22/2022 Fluoroscopy was utilized by the requesting physician.  No radiographic interpretation.   DG C-Arm 1-60 Min-No Report  Result Date: 01/22/2022 Fluoroscopy was utilized by the requesting physician.  No radiographic interpretation.   DG HIP UNILAT WITH PELVIS 1V RIGHT  Result Date: 01/22/2022 CLINICAL DATA:  Fluoroscopic assistance for right hip arthroplasty EXAM: DG HIP (WITH OR WITHOUT PELVIS) 1V RIGHT COMPARISON:  08/23/2021 FINDINGS: Fluoroscopic images show interval removal of intramedullary rod and arthroplasty in the right hip. No fracture is seen. Fluoroscopic time was 14 seconds. Estimated radiation dose is 1.8 mGy. IMPRESSION: Fluoroscopic assistance was provided for right hip arthroplasty. Electronically Signed   By: Elmer Picker M.D.   On: 01/22/2022 10:56   DG HIP UNILAT W OR W/O PELVIS 2-3 VIEWS RIGHT  Result Date: 01/22/2022 CLINICAL DATA:   Right hip arthroplasty EXAM: DG HIP (WITH OR WITHOUT PELVIS) 2-3V RIGHT COMPARISON:  08/23/2021 FINDINGS: Interval postsurgical changes from right total hip arthroplasty. Arthroplasty components are in their expected alignment. No periprosthetic fracture or evidence of other complication. Expected postoperative changes within the overlying soft tissues. IMPRESSION: Satisfactory postoperative appearance status post right total hip arthroplasty. Electronically Signed   By: Davina Poke D.O.   On: 01/22/2022 12:56    Recent Labs: Lab Results  Component Value Date   WBC 7.4 01/23/2022   HGB 9.9 (L) 01/23/2022   PLT 219 01/23/2022   NA 134 (L) 01/23/2022   K 3.3 (L) 01/23/2022   CL 103 01/23/2022   CO2 26 01/23/2022   GLUCOSE 114 (H) 01/23/2022   BUN 13 01/23/2022   CREATININE 0.73 01/23/2022   BILITOT 0.5 01/09/2022   ALKPHOS 31 (L) 01/09/2022   AST 20 01/09/2022   ALT 18 01/09/2022   PROT 6.0 (L) 01/09/2022   ALBUMIN 3.6 01/09/2022   CALCIUM  8.2 (L) 01/23/2022   GFRAA 86 05/15/2021    Speciality Comments: No specialty comments available.  Procedures:  No procedures performed Allergies: Fosamax [alendronate sodium], Singulair [montelukast sodium], Wellbutrin [bupropion], Clindamycin/lincomycin, and Sulfa antibiotics   Assessment / Plan:     Visit Diagnoses: Positive ANA (antinuclear antibody)  Primary osteoarthritis of both hands  Primary osteoarthritis of both feet  DDD (degenerative disc disease), cervical  Spondylolisthesis at L4-L5 level  Osteopenia of multiple sites  Vitamin D deficiency  Abnormal SPEP  History of ulcerative colitis  Essential hypertension  History of asthma  History of hypothyroidism  History of gastroesophageal reflux (GERD)  History of hyperlipidemia  Orders: No orders of the defined types were placed in this encounter.  No orders of the defined types were placed in this encounter.   Face-to-face time spent with patient was  *** minutes. Greater than 50% of time was spent in counseling and coordination of care.  Follow-Up Instructions: No follow-ups on file.   Ofilia Neas, PA-C  Note - This record has been created using Dragon software.  Chart creation errors have been sought, but may not always  have been located. Such creation errors do not reflect on  the standard of medical care.

## 2022-01-25 NOTE — Telephone Encounter (Signed)
ED Discharge  ?[place], [place]  [id number] ?60 years, Female  DOB: 1941/02/14  M: [id number] ?Care Team: Rhys Martini, [place]  ?______________________________________________ ?Emergency Room Discharge Details ?1st Attempt at engaging patient - Date/Time: 01/24/22 at 1:54PM ?What led you to go to the Emergency Room?:  ?Pt. had right hip total replacement. ? ?Was it trauma event or true emergency? ?(Chest pain, loss of consciousness, stroke, motor vehicle accident, fracture, fall, confusion, etc.): Yes ?Was it recommended that you follow up with your health care provider or another provider?: Yes ?Do you already have a follow up with your Provider or specialist?: Yes ?Do you feel comfortable and do you understand the instructions you received from the Emergency Room on warning signs or symptoms that would indicate a need to call the doctor(s), or be seen before your next appointment?: Yes ?Med review completed - Med list from Emergency Room discharge reviewed with patient and changes updated in clinic EMR: Yes ?Were any of the following done while you were in the Emergency Room?: MRI, Medication changes (addressed above in reconciliation), Procedures, Specialist referral ?Do you have someone at home to help you since you?ve been discharged from the hospital/ER/Urgent Care?: No ?Do you have any barriers to receiving the healthcare recommended for you?: Other ?Details: No barriers per pt. ?Are there any questions or concerns you have regarding your Emergency Room visit?: No per pt. ?Engagement Notes ?Vanetta Shawl on 01/24/2022 01:59 PM ? ?Clinical Lead Follow Up ?ED discharge protocol reviewed and: No Follow-Up indicated ?Engagement Notes ?Newton Pigg on 01/25/2022 03:48 PM ?CP assessment time: 5 min ? ?

## 2022-01-26 DIAGNOSIS — M4316 Spondylolisthesis, lumbar region: Secondary | ICD-10-CM | POA: Diagnosis not present

## 2022-01-26 DIAGNOSIS — J45909 Unspecified asthma, uncomplicated: Secondary | ICD-10-CM | POA: Diagnosis not present

## 2022-01-26 DIAGNOSIS — I10 Essential (primary) hypertension: Secondary | ICD-10-CM | POA: Diagnosis not present

## 2022-01-26 DIAGNOSIS — K529 Noninfective gastroenteritis and colitis, unspecified: Secondary | ICD-10-CM | POA: Diagnosis not present

## 2022-01-26 DIAGNOSIS — Z471 Aftercare following joint replacement surgery: Secondary | ICD-10-CM | POA: Diagnosis not present

## 2022-01-26 DIAGNOSIS — E782 Mixed hyperlipidemia: Secondary | ICD-10-CM | POA: Diagnosis not present

## 2022-01-27 ENCOUNTER — Other Ambulatory Visit: Payer: Self-pay | Admitting: Adult Health

## 2022-01-27 LAB — AEROBIC/ANAEROBIC CULTURE W GRAM STAIN (SURGICAL/DEEP WOUND): Culture: NO GROWTH

## 2022-01-28 DIAGNOSIS — M1611 Unilateral primary osteoarthritis, right hip: Secondary | ICD-10-CM | POA: Diagnosis not present

## 2022-01-28 MED ORDER — APIXABAN 5 MG PO TABS: 5.0000 mg | ORAL_TABLET | Freq: Two times a day (BID) | ORAL | 0 refills | Status: DC

## 2022-01-28 NOTE — Telephone Encounter (Signed)
Spoke to patient who is s/p R hip conversion to THA on 3/6. Was also seen in urgent care today by Dr. Alfonso Ramus. She has had increased swelling in the right lower leg. Stable. Mildly painful. Concern for DVT vs post op dependent edema. No respiratory symptoms. Will start her on eliquis 5 BID, she had been taking aspirin for DVT prophylaxis, and we will arrange a doppler US tomorrow morning to eval for possible DVT. If develops respiratory symptoms will need to go to the ER. ?

## 2022-01-29 ENCOUNTER — Other Ambulatory Visit (HOSPITAL_COMMUNITY): Payer: Self-pay | Admitting: Internal Medicine

## 2022-01-29 ENCOUNTER — Ambulatory Visit (HOSPITAL_COMMUNITY)
Admission: RE | Admit: 2022-01-29 | Discharge: 2022-01-29 | Disposition: A | Discharge status: Home / Self Care | Payer: Medicare Other | Source: Ambulatory Visit | Attending: Cardiovascular Disease | Admitting: Cardiovascular Disease

## 2022-01-29 ENCOUNTER — Other Ambulatory Visit: Payer: Self-pay

## 2022-01-29 DIAGNOSIS — M79604 Pain in right leg: Secondary | ICD-10-CM | POA: Diagnosis not present

## 2022-01-30 DIAGNOSIS — J45909 Unspecified asthma, uncomplicated: Secondary | ICD-10-CM | POA: Diagnosis not present

## 2022-01-30 DIAGNOSIS — E782 Mixed hyperlipidemia: Secondary | ICD-10-CM | POA: Diagnosis not present

## 2022-01-30 DIAGNOSIS — Z471 Aftercare following joint replacement surgery: Secondary | ICD-10-CM | POA: Diagnosis not present

## 2022-01-30 DIAGNOSIS — K529 Noninfective gastroenteritis and colitis, unspecified: Secondary | ICD-10-CM | POA: Diagnosis not present

## 2022-01-30 DIAGNOSIS — I10 Essential (primary) hypertension: Secondary | ICD-10-CM | POA: Diagnosis not present

## 2022-01-30 DIAGNOSIS — M4316 Spondylolisthesis, lumbar region: Secondary | ICD-10-CM | POA: Diagnosis not present

## 2022-01-31 DIAGNOSIS — I10 Essential (primary) hypertension: Secondary | ICD-10-CM | POA: Diagnosis not present

## 2022-01-31 DIAGNOSIS — Z471 Aftercare following joint replacement surgery: Secondary | ICD-10-CM | POA: Diagnosis not present

## 2022-01-31 DIAGNOSIS — J45909 Unspecified asthma, uncomplicated: Secondary | ICD-10-CM | POA: Diagnosis not present

## 2022-01-31 DIAGNOSIS — K529 Noninfective gastroenteritis and colitis, unspecified: Secondary | ICD-10-CM | POA: Diagnosis not present

## 2022-01-31 DIAGNOSIS — M4316 Spondylolisthesis, lumbar region: Secondary | ICD-10-CM | POA: Diagnosis not present

## 2022-01-31 DIAGNOSIS — E782 Mixed hyperlipidemia: Secondary | ICD-10-CM | POA: Diagnosis not present

## 2022-02-02 DIAGNOSIS — E782 Mixed hyperlipidemia: Secondary | ICD-10-CM | POA: Diagnosis not present

## 2022-02-02 DIAGNOSIS — K529 Noninfective gastroenteritis and colitis, unspecified: Secondary | ICD-10-CM | POA: Diagnosis not present

## 2022-02-02 DIAGNOSIS — M25571 Pain in right ankle and joints of right foot: Secondary | ICD-10-CM | POA: Diagnosis not present

## 2022-02-02 DIAGNOSIS — M1611 Unilateral primary osteoarthritis, right hip: Secondary | ICD-10-CM | POA: Diagnosis not present

## 2022-02-02 DIAGNOSIS — I10 Essential (primary) hypertension: Secondary | ICD-10-CM | POA: Diagnosis not present

## 2022-02-02 DIAGNOSIS — Z471 Aftercare following joint replacement surgery: Secondary | ICD-10-CM | POA: Diagnosis not present

## 2022-02-02 DIAGNOSIS — M4316 Spondylolisthesis, lumbar region: Secondary | ICD-10-CM | POA: Diagnosis not present

## 2022-02-02 DIAGNOSIS — J45909 Unspecified asthma, uncomplicated: Secondary | ICD-10-CM | POA: Diagnosis not present

## 2022-02-06 ENCOUNTER — Telehealth: Payer: Self-pay | Admitting: Family Medicine

## 2022-02-06 DIAGNOSIS — M1611 Unilateral primary osteoarthritis, right hip: Secondary | ICD-10-CM | POA: Diagnosis not present

## 2022-02-06 DIAGNOSIS — K529 Noninfective gastroenteritis and colitis, unspecified: Secondary | ICD-10-CM | POA: Diagnosis not present

## 2022-02-06 DIAGNOSIS — I10 Essential (primary) hypertension: Secondary | ICD-10-CM | POA: Diagnosis not present

## 2022-02-06 DIAGNOSIS — J45909 Unspecified asthma, uncomplicated: Secondary | ICD-10-CM | POA: Diagnosis not present

## 2022-02-06 DIAGNOSIS — E782 Mixed hyperlipidemia: Secondary | ICD-10-CM | POA: Diagnosis not present

## 2022-02-06 DIAGNOSIS — Z471 Aftercare following joint replacement surgery: Secondary | ICD-10-CM | POA: Diagnosis not present

## 2022-02-06 DIAGNOSIS — M4316 Spondylolisthesis, lumbar region: Secondary | ICD-10-CM | POA: Diagnosis not present

## 2022-02-06 NOTE — Telephone Encounter (Signed)
Pt informed of below.  She wants to wait 1 more week to start Evenity. Due to her recent hip replacement she is unable to ride in the car.  ?

## 2022-02-06 NOTE — Telephone Encounter (Signed)
Left VM for patient. If she calls back please have her speak with a nurse/CMA and inform that we can start the evenity at any point even if she had a hip replacement performed.  ? ?If any questions then please take the best time and phone number to call and I will try to call her back.  ? ?Rosemarie Ax, MD ?[facility] ?02/06/2022, 2:48 PM ? ? ?

## 2022-02-06 NOTE — Telephone Encounter (Signed)
Pt left msg while office clsd for lunch requesting provider call her back w/ Advice regarding her upcoming Evenity injection & recommendations of she should get it since she just had Hip replacement surgery. ? ?--Forwarding message to provider for review & to contact pt @ [phone] ? ?-glh ?

## 2022-02-07 ENCOUNTER — Ambulatory Visit: Payer: Medicare Other

## 2022-02-08 ENCOUNTER — Ambulatory Visit: Payer: Medicare Other | Admitting: Physician Assistant

## 2022-02-08 DIAGNOSIS — R262 Difficulty in walking, not elsewhere classified: Secondary | ICD-10-CM | POA: Diagnosis not present

## 2022-02-08 DIAGNOSIS — M1611 Unilateral primary osteoarthritis, right hip: Secondary | ICD-10-CM | POA: Diagnosis not present

## 2022-02-08 DIAGNOSIS — Z8639 Personal history of other endocrine, nutritional and metabolic disease: Secondary | ICD-10-CM

## 2022-02-08 DIAGNOSIS — Z8709 Personal history of other diseases of the respiratory system: Secondary | ICD-10-CM

## 2022-02-08 DIAGNOSIS — M25651 Stiffness of right hip, not elsewhere classified: Secondary | ICD-10-CM | POA: Diagnosis not present

## 2022-02-08 DIAGNOSIS — Z96641 Presence of right artificial hip joint: Secondary | ICD-10-CM | POA: Diagnosis not present

## 2022-02-08 DIAGNOSIS — R778 Other specified abnormalities of plasma proteins: Secondary | ICD-10-CM

## 2022-02-08 DIAGNOSIS — I10 Essential (primary) hypertension: Secondary | ICD-10-CM

## 2022-02-08 DIAGNOSIS — M6281 Muscle weakness (generalized): Secondary | ICD-10-CM | POA: Diagnosis not present

## 2022-02-08 DIAGNOSIS — M8589 Other specified disorders of bone density and structure, multiple sites: Secondary | ICD-10-CM

## 2022-02-08 DIAGNOSIS — M19041 Primary osteoarthritis, right hand: Secondary | ICD-10-CM

## 2022-02-08 DIAGNOSIS — E559 Vitamin D deficiency, unspecified: Secondary | ICD-10-CM

## 2022-02-08 DIAGNOSIS — Z8719 Personal history of other diseases of the digestive system: Secondary | ICD-10-CM

## 2022-02-08 DIAGNOSIS — M4316 Spondylolisthesis, lumbar region: Secondary | ICD-10-CM

## 2022-02-08 DIAGNOSIS — R768 Other specified abnormal immunological findings in serum: Secondary | ICD-10-CM

## 2022-02-08 DIAGNOSIS — M19071 Primary osteoarthritis, right ankle and foot: Secondary | ICD-10-CM

## 2022-02-08 DIAGNOSIS — M5030 Other cervical disc degeneration, unspecified cervical region: Secondary | ICD-10-CM

## 2022-02-12 DIAGNOSIS — R262 Difficulty in walking, not elsewhere classified: Secondary | ICD-10-CM | POA: Diagnosis not present

## 2022-02-12 DIAGNOSIS — M6281 Muscle weakness (generalized): Secondary | ICD-10-CM | POA: Diagnosis not present

## 2022-02-12 DIAGNOSIS — M1611 Unilateral primary osteoarthritis, right hip: Secondary | ICD-10-CM | POA: Diagnosis not present

## 2022-02-12 DIAGNOSIS — M25651 Stiffness of right hip, not elsewhere classified: Secondary | ICD-10-CM | POA: Diagnosis not present

## 2022-02-12 DIAGNOSIS — Z96641 Presence of right artificial hip joint: Secondary | ICD-10-CM | POA: Diagnosis not present

## 2022-02-14 ENCOUNTER — Ambulatory Visit (INDEPENDENT_AMBULATORY_CARE_PROVIDER_SITE_OTHER): Payer: Medicare Other | Admitting: *Deleted

## 2022-02-14 DIAGNOSIS — M8000XG Age-related osteoporosis with current pathological fracture, unspecified site, subsequent encounter for fracture with delayed healing: Secondary | ICD-10-CM | POA: Diagnosis not present

## 2022-02-14 NOTE — Progress Notes (Signed)
Patient is here for nurse visit for her Evenity injection. Patient received bilateral [place] arm injections.  She tolerated injections well. She will return in 1 month for her next Evenity injection. ?

## 2022-02-15 DIAGNOSIS — R262 Difficulty in walking, not elsewhere classified: Secondary | ICD-10-CM | POA: Diagnosis not present

## 2022-02-15 DIAGNOSIS — Z96641 Presence of right artificial hip joint: Secondary | ICD-10-CM | POA: Diagnosis not present

## 2022-02-15 DIAGNOSIS — M1611 Unilateral primary osteoarthritis, right hip: Secondary | ICD-10-CM | POA: Diagnosis not present

## 2022-02-15 DIAGNOSIS — M6281 Muscle weakness (generalized): Secondary | ICD-10-CM | POA: Diagnosis not present

## 2022-02-15 DIAGNOSIS — M25651 Stiffness of right hip, not elsewhere classified: Secondary | ICD-10-CM | POA: Diagnosis not present

## 2022-02-19 DIAGNOSIS — M1611 Unilateral primary osteoarthritis, right hip: Secondary | ICD-10-CM | POA: Diagnosis not present

## 2022-02-19 DIAGNOSIS — M6281 Muscle weakness (generalized): Secondary | ICD-10-CM | POA: Diagnosis not present

## 2022-02-19 DIAGNOSIS — R262 Difficulty in walking, not elsewhere classified: Secondary | ICD-10-CM | POA: Diagnosis not present

## 2022-02-19 DIAGNOSIS — M25651 Stiffness of right hip, not elsewhere classified: Secondary | ICD-10-CM | POA: Diagnosis not present

## 2022-02-19 DIAGNOSIS — Z96641 Presence of right artificial hip joint: Secondary | ICD-10-CM | POA: Diagnosis not present

## 2022-02-21 ENCOUNTER — Encounter: Payer: Self-pay | Admitting: Nurse Practitioner

## 2022-02-21 ENCOUNTER — Ambulatory Visit (INDEPENDENT_AMBULATORY_CARE_PROVIDER_SITE_OTHER): Payer: Medicare Other | Admitting: Nurse Practitioner

## 2022-02-21 VITALS — BP 122/70 | HR 69 | Temp 97.9°F | Resp 16 | Ht 64.0 in | Wt 113.4 lb

## 2022-02-21 DIAGNOSIS — I951 Orthostatic hypotension: Secondary | ICD-10-CM

## 2022-02-21 DIAGNOSIS — R42 Dizziness and giddiness: Secondary | ICD-10-CM

## 2022-02-21 DIAGNOSIS — R5383 Other fatigue: Secondary | ICD-10-CM

## 2022-02-21 DIAGNOSIS — Z9889 Other specified postprocedural states: Secondary | ICD-10-CM

## 2022-02-21 NOTE — Progress Notes (Addendum)
Assessment and Plan: ? ?Debbie Houston was seen today for a follow up. ? ?Diagnoses and all order for this visit: ? ?1. Orthostatic hypotension/dizziness/fatigue ? ?Discussed importance of staying well hydrated. ?Moving slowy to prevent any increase in syncope/falls. ?Continue flonase, OTC antihistamine, pantoprazole. ?Assess for further FVD, blood loss, anemia. ?Continue apt with Dr. Wilburn Cornelia for updated hearing test. ? ?- CBC with Differential/Platelet ?- COMPLETE METABOLIC PANEL WITH GFR ? ? ?2. Status post hip surgery ? ?Continue to follow with PT, surgery. ?RTC if s/s fail to improve or worsen over time.  ? ? ?Further disposition pending results of labs. Discussed med's effects and SE's.   ?Over 30 minutes of exam, counseling, chart review, and critical decision making was performed.  ? ?Future Appointments  ?Date Time Provider [place]  ?03/13/2022 10:20 AM Ofilia Neas, PA-C [facility] None  ?03/21/2022 11:30 AM [facility] NURSE [facility]  ?04/24/2022 10:30 AM Tolia, Sunit, DO [facility] None  ?05/15/2022  3:00 PM Unk Pinto, MD [facility] None  ?06/05/2022 10:30 AM Newton Pigg, RPH [facility] None  ?08/21/2022 10:00 AM Magda Bernheim, NP [facility] None  ? ? ?------------------------------------------------------------------------------------------------------------------ ? ? ?HPI ?BP 122/70 Comment: 124/70 laying, 114/68 standing  Pulse 69   Temp 97.9 ?F (36.6 ?C)   Resp 16   Ht 5' 4"  (1.626 m)   Wt 113 lb 6.4 oz (51.4 kg)   SpO2 95%   BMI 19.47 kg/m?  ? ?81 y.o.female presents with c/o dizziness, right ear fullness, nasal drainage, pain in right side of neck, headache.  On 12/28/21 she was seen by Dr. West Carbo, Otolaryngology and noted to have eustacian tube dysfunction and asymmetric sensorineural hearing loss.  She was asked to follow up with Dr. Wilburn Cornelia for an updated hearing test.  She was instructed to continue flonase, OTC antihistamine, Pantoprazole.  Reports compliance.  Has scheduled apt  with Dr. Wilburn Cornelia next week.   ? ?She is also 4 week s/p right hip surgery.  She is working with PT and overall doing well.  Upon review of surgical encounter, she had a blood transfusion.  Her hospital lab work 01/23/22 revealed a mild volume deficit; hyponatremia (134), hypokalemia (3,3), no AKI, Hgb of 9.9.  She will follow up with surgery next week.  ? ?She has asymmetric sensorineural hearing loss and wears hearing aids, but does not wear them consistently.  Never a smoker.   Orthostatics were positive.  BP is stable in clinic. She is not staying well hydrated.   ? ?Orthostatics:   ?Lying:  124/70 ?Sitting:  122/70 ?Standing:  114/68 ? ?Past Medical History:  ?Diagnosis Date  ? Arthritis   ? Benign labile hypertension   ? Cancer [facility])   ? skin cancer on face  ? Colitis   ? DJD (degenerative joint disease)   ? Endometrial polyp   ? GERD (gastroesophageal reflux disease)   ? History of basal cell carcinoma excision   ? History of esophagitis   ? HOH (hard of hearing)   ? right ear better than left  -- wears no aides  ? Hyperlipidemia   ? Hypothyroidism   ? Neuromuscular disorder ([place])   ? neuropathy in feet  ? Osteopenia   ? Prediabetes   ? S/P right hip fracture 08/17/2020  ? Vitamin D deficiency   ? Wears glasses   ?  ? ?Allergies  ?Allergen Reactions  ? Fosamax [Alendronate Sodium] Other (See Comments)  ?  GI upset   ? Singulair [Montelukast Sodium] Other (See  Comments)  ?  Makes pt jittery  ? Wellbutrin [Bupropion] Hives  ? Clindamycin/Lincomycin Rash  ? Sulfa Antibiotics Rash  ? ? ?Current Outpatient Medications on File Prior to Visit  ?Medication Sig  ? acetaminophen (TYLENOL) 500 MG tablet Take 500 mg by mouth at bedtime as needed for moderate pain or headache.   ? Ascorbic Acid (VITAMIN C) 1000 MG tablet Take 1,000 mg by mouth daily.   ? aspirin EC 81 MG tablet Take 1 tablet (81 mg total) by mouth in the morning and at bedtime.  ? bisoprolol (ZEBETA) 5 MG tablet TAKE 1/2 TABLET BY MOUTH EVERY DAY IN THE  EVENING FOR BLOOD PRESSURE  ? Calcium Carbonate-Vit D-Min (CALCIUM 1200 PO) Take 1,200 mg by mouth daily.  ? cetirizine (ZYRTEC) 10 MG tablet Take 10 mg by mouth at bedtime.  ? CINNAMON PO Take 2,000 mg by mouth daily.  ? Coenzyme Q10 (COQ10) 200 MG CAPS Take 200 mg by mouth daily.  ? colestipol (COLESTID) 1 g tablet Take 4 g by mouth daily.  ? levothyroxine (SYNTHROID) 125 MCG tablet TAKE 1 TABLET BY MOUTH DAILY ON AN EMPTY STOMACH WITH ONLY WATER FOR 30 MINUTES(NO ANTACIDS MEDICATIONS, CALCIUM OR MAGNESIUM FOR 4 HOURS) (Patient taking differently: Take 125 mcg by mouth every other day.)  ? loratadine (CLARITIN) 10 MG tablet Take 10 mg by mouth daily as needed for allergies.  ? Multiple Vitamin (THERA VITAMIN PO) Take 1 tablet by mouth daily.  ? Omega-3 Fatty Acids (FISH OIL) 1200 MG CAPS Take 1,200 mg by mouth daily.   ? pantoprazole (PROTONIX) 40 MG tablet Take 1 tablet  2 x /day  for Heartburn & Indigestion  ? Polyethyl Glycol-Propyl Glycol 0.4-0.3 % SOLN Place 1 drop into both eyes at bedtime.  ? Probiotic Product (PROBIOTIC PO) Take 1 capsule by mouth daily.  ? PROLIA 60 MG/ML SOSY injection Inject 60 mg into the skin every 6 (six) months.  ? simvastatin (ZOCOR) 20 MG tablet Take  1 tablet  at Bedtime  for Cholesterol                                                 /                        TAKE             BY MOUTH  ? Vedolizumab (ENTYVIO IV) Inject 1 Dose into the vein every 30 (thirty) days.  ? apixaban (ELIQUIS) 5 MG TABS tablet Take 1 tablet (5 mg total) by mouth 2 (two) times daily.  ? dexamethasone (DECADRON) 2 MG tablet Take 1 tab 3 x day for 3 days, then 2 x day for  3 days, then 1 tab daily for 5 days then 1 tablet every other day (Patient not taking: Reported on 01/05/2022)  ? pseudoephedrine (SUDAFED) 120 MG 12 hr tablet Take  1 tablet  2 x /day (every 12 hours)  for Head and Chest Congestion (Patient not taking: Reported on 01/05/2022)  ? ?No current facility-administered medications on file prior to  visit.  ? ? ?ROS: all negative except what is noted in the HPI.  ? ?Physical Exam: ? ?BP 122/70 Comment: 124/70 laying, 114/68 standing  Pulse 69   Temp 97.9 ?F (36.6 ?C)   Resp 16  Ht 5' 4"  (1.626 m)   Wt 113 lb 6.4 oz (51.4 kg)   SpO2 95%   BMI 19.47 kg/m?  ? ?General Appearance: NAD.  Awake, conversant and cooperative. ?Eyes: PERRLA, EOMs intact.  Sclera white.  Conjunctiva without erythema. ?Sinuses: No frontal/maxillary tenderness.  No nasal discharge. Nares patent.  ?ENT/Mouth: Ext aud canals clear.  Bilateral TMs w/DOL and without erythema or bulging. Hearing intact.  Posterior pharynx without swelling or exudate.  Tonsils without swelling or erythema.  ?Neck: Supple.  No masses, nodules or thyromegaly. ?Respiratory: Effort is regular with non-labored breathing. Breath sounds are equal bilaterally without rales, rhonchi, wheezing or stridor.  ?Cardio: RRR with no MRGs. Brisk peripheral pulses without edema.  ?Abdomen: Active BS in all four quadrants.  Soft and non-tender without guarding, rebound tenderness, hernias or masses. ?Lymphatics: Non tender without lymphadenopathy.  ?Musculoskeletal: Full ROM, 5/5 strength, normal ambulation.  No clubbing or cyanosis. ?Skin: Appropriate color for ethnicity. Warm without rashes, lesions, ecchymosis, ulcers.  ?Neuro: CN II-XII grossly normal. Normal muscle tone without cerebellar symptoms and intact sensation.   ?Psych: AO X 3,  appropriate mood and affect, insight and judgment.  ?  ? ?Darrol Jump, NP ?9:47 AM ?[facility] ? ?

## 2022-02-22 ENCOUNTER — Other Ambulatory Visit: Payer: Self-pay | Admitting: Nurse Practitioner

## 2022-02-22 DIAGNOSIS — D649 Anemia, unspecified: Secondary | ICD-10-CM

## 2022-02-22 LAB — COMPLETE METABOLIC PANEL WITH GFR
AG Ratio: 2.1 (calc) (ref 1.0–2.5)
ALT: 11 U/L (ref 6–29)
AST: 19 U/L (ref 10–35)
Albumin: 4 g/dL (ref 3.6–5.1)
Alkaline phosphatase (APISO): 55 U/L (ref 37–153)
BUN: 11 mg/dL (ref 7–25)
CO2: 28 mmol/L (ref 20–32)
Calcium: 9.7 mg/dL (ref 8.6–10.4)
Chloride: 102 mmol/L (ref 98–110)
Creat: 0.77 mg/dL (ref 0.60–0.95)
Globulin: 1.9 g/dL (calc) (ref 1.9–3.7)
Glucose, Bld: 87 mg/dL (ref 65–99)
Potassium: 4.5 mmol/L (ref 3.5–5.3)
Sodium: 136 mmol/L (ref 135–146)
Total Bilirubin: 0.4 mg/dL (ref 0.2–1.2)
Total Protein: 5.9 g/dL — ABNORMAL LOW (ref 6.1–8.1)
eGFR: 78 mL/min/{1.73_m2} (ref >=60)

## 2022-02-22 LAB — CBC WITH DIFFERENTIAL/PLATELET
Absolute Monocytes: 529 cells/uL (ref 200–950)
Basophils Absolute: 51 cells/uL (ref 0–200)
Basophils Relative: 1.1 %
Eosinophils Absolute: 262 cells/uL (ref 15–500)
Eosinophils Relative: 5.7 %
HCT: 32.4 % — ABNORMAL LOW (ref 35.0–45.0)
Hemoglobin: 10.5 g/dL — ABNORMAL LOW (ref 11.7–15.5)
Lymphs Abs: 1132 cells/uL (ref 850–3900)
MCH: 31.7 pg (ref 27.0–33.0)
MCHC: 32.4 g/dL (ref 32.0–36.0)
MCV: 97.9 fL (ref 80.0–100.0)
MPV: 10.7 fL (ref 7.5–12.5)
Monocytes Relative: 11.5 %
Neutro Abs: 2627 cells/uL (ref 1500–7800)
Neutrophils Relative %: 57.1 %
Platelets: 273 10*3/uL (ref 140–400)
RBC: 3.31 10*6/uL — ABNORMAL LOW (ref 3.80–5.10)
RDW: 13.8 % (ref 11.0–15.0)
Total Lymphocyte: 24.6 %
WBC: 4.6 10*3/uL (ref 3.8–10.8)

## 2022-02-26 DIAGNOSIS — H6983 Other specified disorders of Eustachian tube, bilateral: Secondary | ICD-10-CM | POA: Diagnosis not present

## 2022-02-26 DIAGNOSIS — H903 Sensorineural hearing loss, bilateral: Secondary | ICD-10-CM | POA: Diagnosis not present

## 2022-02-26 DIAGNOSIS — H906 Mixed conductive and sensorineural hearing loss, bilateral: Secondary | ICD-10-CM | POA: Diagnosis not present

## 2022-02-27 DIAGNOSIS — M25651 Stiffness of right hip, not elsewhere classified: Secondary | ICD-10-CM | POA: Diagnosis not present

## 2022-02-27 DIAGNOSIS — K51919 Ulcerative colitis, unspecified with unspecified complications: Secondary | ICD-10-CM | POA: Diagnosis not present

## 2022-02-27 DIAGNOSIS — M6281 Muscle weakness (generalized): Secondary | ICD-10-CM | POA: Diagnosis not present

## 2022-02-27 DIAGNOSIS — M1611 Unilateral primary osteoarthritis, right hip: Secondary | ICD-10-CM | POA: Diagnosis not present

## 2022-02-27 DIAGNOSIS — R262 Difficulty in walking, not elsewhere classified: Secondary | ICD-10-CM | POA: Diagnosis not present

## 2022-02-27 DIAGNOSIS — Z96641 Presence of right artificial hip joint: Secondary | ICD-10-CM | POA: Diagnosis not present

## 2022-02-27 NOTE — Progress Notes (Deleted)
Office Visit Note  Patient: Debbie Houston             Date of Birth: 1941/06/15           MRN: [id number]             PCP: Unk Pinto, MD Referring: Unk Pinto, MD Visit Date: 03/13/2022 Occupation: @GUAROCC @  Subjective:    History of Present Illness: Debbie Houston is a 81 y.o. female with history of positive ANA, osteoarthritis, and DDD.    Activities of Daily Living:  Patient reports morning stiffness for *** {minute/hour:19697}.   Patient {ACTIONS;DENIES/REPORTS:21021675::"Denies"} nocturnal pain.  Difficulty dressing/grooming: {ACTIONS;DENIES/REPORTS:21021675::"Denies"} Difficulty climbing stairs: {ACTIONS;DENIES/REPORTS:21021675::"Denies"} Difficulty getting out of chair: {ACTIONS;DENIES/REPORTS:21021675::"Denies"} Difficulty using hands for taps, buttons, cutlery, and/or writing: {ACTIONS;DENIES/REPORTS:21021675::"Denies"}  No Rheumatology ROS completed.   PMFS History:  Patient Active Problem List   Diagnosis Date Noted  . Intertrochanteric fracture of right femur, closed, with nonunion, subsequent encounter 01/22/2022  . History of ulcerative colitis 07/10/2021  . Complete right bundle branch block (RBBB) 05/15/2021  . Body mass index (BMI) of 19.0-19.9 in adult 05/12/2021  . Bilateral nephrolithiasis 09/21/2020  . Senile purpura ([place]) 08/17/2020  . Bilateral temporomandibular joint pain 06/15/2020  . Vertigo 03/25/2019  . Spondylolisthesis at L4-L5 level 11/25/2018  . Colitis 12/21/2014  . Medication management 04/20/2014  . Essential hypertension   . Hyperlipidemia, mixed   . Hypothyroid   . GERD   . Asthma   . DJD (degenerative joint disease)   . Vitamin D deficiency   . Osteoporosis with current pathological fracture   . Allergy     Past Medical History:  Diagnosis Date  . Arthritis   . Benign labile hypertension   . Cancer ([place])    skin cancer on face  . Colitis   . DJD (degenerative joint disease)   . Endometrial polyp   . GERD  (gastroesophageal reflux disease)   . History of basal cell carcinoma excision   . History of esophagitis   . HOH (hard of hearing)    right ear better than left  -- wears no aides  . Hyperlipidemia   . Hypothyroidism   . Neuromuscular disorder ([facility])    neuropathy in feet  . Osteopenia   . Prediabetes   . S/P right hip fracture 08/17/2020  . Vitamin D deficiency   . Wears glasses     Family History  Problem Relation Age of Onset  . Hypertension Mother   . Cancer Mother        COLON  . Hyperlipidemia Mother   . Cancer Father        BREAST WITH BRAIN METS  . Hyperlipidemia Father   . Cancer Sister        COLON (TEENS), BREAST (102)  . Hyperlipidemia Sister   . Diabetes Brother   . Hyperlipidemia Brother   . Hypertension Brother   . Colon cancer Brother 38  . Cancer Maternal Grandmother        RENAL  . Hyperlipidemia Maternal Grandmother   . Diabetes Maternal Grandfather   . Stroke Maternal Grandfather   . Hyperlipidemia Maternal Grandfather   . Hyperlipidemia Paternal Grandmother   . Hyperlipidemia Paternal Grandfather   . Autism Son   . Colitis Son   . Autism spectrum disorder Son   . Healthy Son    Past Surgical History:  Procedure Laterality Date  . BACK SURGERY  2020  . CATARACT EXTRACTION, BILATERAL Bilateral 2020  . CONVERSION TO  TOTAL HIP Right 01/22/2022   Procedure: CONVERSION TO TOTAL HIP;  Surgeon: Willaim Sheng, MD;  Location: [facility];  Service: Orthopedics;  Laterality: Right;  . DILATATION & CURETTAGE/HYSTEROSCOPY WITH MYOSURE N/A 10/27/2014   Procedure: DILATATION & CURETTAGE/HYSTEROSCOPY WITH MYOSURE;  Surgeon: Darlyn Chamber, MD;  Location: [facility];  Service: Gynecology;  Laterality: N/A;  . DILATION AND CURETTAGE OF UTERUS    . HIP SURGERY Right 04/2020   rod and screw in right hip   . MOHS SURGERY  2005   LEFT NASAL BRIDGE FOR BASAL CELL  . TONSILLECTOMY  age 50   Social History   Social History Narrative  . Not on file    Immunization History  Administered Date(s) Administered  . Influenza, High Dose Seasonal PF 07/23/2014, 08/23/2015, 07/10/2016, 08/27/2017, 08/22/2018, 08/25/2019, 08/30/2020, 08/21/2021  . PFIZER(Purple Top)SARS-COV-2 Vaccination 12/10/2019, 12/31/2019, 08/16/2020, 04/14/2021  . Pneumococcal Conjugate-13 05/05/2015  . Pneumococcal-Unspecified 08/22/2011  . Tdap 08/22/2011  . Zoster Recombinat (Shingrix) 09/13/2017, 01/01/2018  . Zoster, Live 06/20/2006     Objective: Vital Signs: There were no vitals taken for this visit.   Physical Exam Vitals and nursing note reviewed.  Constitutional:      Appearance: She is well-developed.  HENT:     Head: Normocephalic and atraumatic.  Eyes:     Conjunctiva/sclera: Conjunctivae normal.  Cardiovascular:     Rate and Rhythm: Normal rate and regular rhythm.     Heart sounds: Normal heart sounds.  Pulmonary:     Effort: Pulmonary effort is normal.     Breath sounds: Normal breath sounds.  Abdominal:     General: Bowel sounds are normal.     Palpations: Abdomen is soft.  Musculoskeletal:     Cervical back: Normal range of motion.  Skin:    General: Skin is warm and dry.     Capillary Refill: Capillary refill takes less than 2 seconds.  Neurological:     Mental Status: She is alert and oriented to person, place, and time.  Psychiatric:        Behavior: Behavior normal.     Musculoskeletal Exam: ***  CDAI Exam: CDAI Score: -- Patient Global: --; Provider Global: -- Swollen: --; Tender: -- Joint Exam 03/13/2022   No joint exam has been documented for this visit   There is currently no information documented on the homunculus. Go to the Rheumatology activity and complete the homunculus joint exam.  Investigation: No additional findings.  Imaging: VAS [place] LOWER EXTREMITY VENOUS (DVT)  Result Date: 01/31/2022  Lower Venous DVT Study Patient Name:  Debbie Houston  Date of Exam:   01/29/2022 Medical Rec #: [id number]        Accession #:    [id number] Date of Birth: 1941/06/09       Patient Gender: F Patient Age:   35 years Exam Location:  [facility] Procedure:      VAS [place] LOWER EXTREMITY VENOUS (DVT) Referring Phys: Gwyndolyn Saxon [place] --------------------------------------------------------------------------------  Indications: Swelling. Other Indications: Swelling in right calf since hip surgery. Patient denies any                    shortness of breath. Risk Factors: Surgery 01/22/22-Total right hip replacement. Anticoagulation: Eliquis. Performing Technologist: Wilkie Aye RVT  Examination Guidelines: A complete evaluation includes B-mode imaging, spectral Doppler, color Doppler, and power Doppler as needed of all accessible portions of each vessel. Bilateral testing is considered an integral part of a complete examination. Limited examinations  for reoccurring indications may be performed as noted. The reflux portion of the exam is performed with the patient in reverse Trendelenburg.  +---------+---------------+---------+-----------+----------+--------------+ RIGHT    CompressibilityPhasicitySpontaneityPropertiesThrombus Aging +---------+---------------+---------+-----------+----------+--------------+ CFV      Full           Yes      Yes                                 +---------+---------------+---------+-----------+----------+--------------+ SFJ      Full           Yes      Yes                                 +---------+---------------+---------+-----------+----------+--------------+ FV Prox  Full           Yes      Yes                                 +---------+---------------+---------+-----------+----------+--------------+ FV Mid   Full           Yes      Yes                                 +---------+---------------+---------+-----------+----------+--------------+ FV DistalFull           Yes      Yes                                  +---------+---------------+---------+-----------+----------+--------------+ PFV      Full                                                        +---------+---------------+---------+-----------+----------+--------------+ POP      Full           Yes      Yes                                 +---------+---------------+---------+-----------+----------+--------------+ PTV      Full           Yes      Yes                                 +---------+---------------+---------+-----------+----------+--------------+ PERO     Full           Yes      Yes                                 +---------+---------------+---------+-----------+----------+--------------+ Gastroc  Full                                                        +---------+---------------+---------+-----------+----------+--------------+ GSV  Full           Yes      Yes                                 +---------+---------------+---------+-----------+----------+--------------+   +----+---------------+---------+-----------+----------+--------------+ LEFTCompressibilityPhasicitySpontaneityPropertiesThrombus Aging +----+---------------+---------+-----------+----------+--------------+ CFV Full           Yes      Yes                                 +----+---------------+---------+-----------+----------+--------------+  Summary: RIGHT: - No evidence of deep vein thrombosis in the lower extremity. No indirect evidence of obstruction proximal to the inguinal ligament. interstial fluid in the calf  LEFT: - No evidence of common femoral vein obstruction.  *See table(s) above for measurements and observations. Electronically signed by Kathlyn Sacramento MD on 01/31/2022 at 5:03:33 PM.    Final     Recent Labs: Lab Results  Component Value Date   WBC 4.6 02/21/2022   HGB 10.5 (L) 02/21/2022   PLT 273 02/21/2022   NA 136 02/21/2022   K 4.5 02/21/2022   CL 102 02/21/2022   CO2 28 02/21/2022   GLUCOSE 87  02/21/2022   BUN 11 02/21/2022   CREATININE 0.77 02/21/2022   BILITOT 0.4 02/21/2022   ALKPHOS 31 (L) 01/09/2022   AST 19 02/21/2022   ALT 11 02/21/2022   PROT 5.9 (L) 02/21/2022   ALBUMIN 3.6 01/09/2022   CALCIUM 9.7 02/21/2022   GFRAA 86 05/15/2021    Speciality Comments: No specialty comments available.  Procedures:  No procedures performed Allergies: Fosamax [alendronate sodium], Singulair [montelukast sodium], Wellbutrin [bupropion], Clindamycin/lincomycin, and Sulfa antibiotics   Assessment / Plan:     Visit Diagnoses: No diagnosis found.  Orders: No orders of the defined types were placed in this encounter.  No orders of the defined types were placed in this encounter.   Face-to-face time spent with patient was *** minutes. Greater than 50% of time was spent in counseling and coordination of care.  Follow-Up Instructions: No follow-ups on file.   Earnestine Mealing, CMA  Note - This record has been created using Editor, commissioning.  Chart creation errors have been sought, but may not always  have been located. Such creation errors do not reflect on  the standard of medical care.

## 2022-03-01 ENCOUNTER — Other Ambulatory Visit: Payer: Medicare Other

## 2022-03-01 DIAGNOSIS — R262 Difficulty in walking, not elsewhere classified: Secondary | ICD-10-CM | POA: Diagnosis not present

## 2022-03-01 DIAGNOSIS — M1611 Unilateral primary osteoarthritis, right hip: Secondary | ICD-10-CM | POA: Diagnosis not present

## 2022-03-01 DIAGNOSIS — Z96641 Presence of right artificial hip joint: Secondary | ICD-10-CM | POA: Diagnosis not present

## 2022-03-01 DIAGNOSIS — M6281 Muscle weakness (generalized): Secondary | ICD-10-CM | POA: Diagnosis not present

## 2022-03-01 DIAGNOSIS — M25651 Stiffness of right hip, not elsewhere classified: Secondary | ICD-10-CM | POA: Diagnosis not present

## 2022-03-02 ENCOUNTER — Other Ambulatory Visit: Payer: Self-pay | Admitting: Nurse Practitioner

## 2022-03-02 ENCOUNTER — Other Ambulatory Visit: Payer: Medicare Other

## 2022-03-02 DIAGNOSIS — D649 Anemia, unspecified: Secondary | ICD-10-CM

## 2022-03-02 DIAGNOSIS — M1611 Unilateral primary osteoarthritis, right hip: Secondary | ICD-10-CM | POA: Diagnosis not present

## 2022-03-03 LAB — IRON,TIBC AND FERRITIN PANEL
%SAT: 19 % (calc) (ref 16–45)
Ferritin: 26 ng/mL (ref 16–288)
Iron: 69 ug/dL (ref 45–160)
TIBC: 363 mcg/dL (calc) (ref 250–450)

## 2022-03-05 DIAGNOSIS — Z96641 Presence of right artificial hip joint: Secondary | ICD-10-CM | POA: Diagnosis not present

## 2022-03-05 DIAGNOSIS — M6281 Muscle weakness (generalized): Secondary | ICD-10-CM | POA: Diagnosis not present

## 2022-03-05 DIAGNOSIS — M25651 Stiffness of right hip, not elsewhere classified: Secondary | ICD-10-CM | POA: Diagnosis not present

## 2022-03-05 DIAGNOSIS — M1611 Unilateral primary osteoarthritis, right hip: Secondary | ICD-10-CM | POA: Diagnosis not present

## 2022-03-05 DIAGNOSIS — R262 Difficulty in walking, not elsewhere classified: Secondary | ICD-10-CM | POA: Diagnosis not present

## 2022-03-08 DIAGNOSIS — M6281 Muscle weakness (generalized): Secondary | ICD-10-CM | POA: Diagnosis not present

## 2022-03-08 DIAGNOSIS — M1611 Unilateral primary osteoarthritis, right hip: Secondary | ICD-10-CM | POA: Diagnosis not present

## 2022-03-08 DIAGNOSIS — M25651 Stiffness of right hip, not elsewhere classified: Secondary | ICD-10-CM | POA: Diagnosis not present

## 2022-03-08 DIAGNOSIS — Z96641 Presence of right artificial hip joint: Secondary | ICD-10-CM | POA: Diagnosis not present

## 2022-03-08 DIAGNOSIS — R262 Difficulty in walking, not elsewhere classified: Secondary | ICD-10-CM | POA: Diagnosis not present

## 2022-03-13 ENCOUNTER — Ambulatory Visit: Payer: Medicare Other | Admitting: Physician Assistant

## 2022-03-13 DIAGNOSIS — M8589 Other specified disorders of bone density and structure, multiple sites: Secondary | ICD-10-CM

## 2022-03-13 DIAGNOSIS — I10 Essential (primary) hypertension: Secondary | ICD-10-CM

## 2022-03-13 DIAGNOSIS — M19041 Primary osteoarthritis, right hand: Secondary | ICD-10-CM

## 2022-03-13 DIAGNOSIS — Z8709 Personal history of other diseases of the respiratory system: Secondary | ICD-10-CM

## 2022-03-13 DIAGNOSIS — R778 Other specified abnormalities of plasma proteins: Secondary | ICD-10-CM

## 2022-03-13 DIAGNOSIS — M4316 Spondylolisthesis, lumbar region: Secondary | ICD-10-CM

## 2022-03-13 DIAGNOSIS — M19071 Primary osteoarthritis, right ankle and foot: Secondary | ICD-10-CM

## 2022-03-13 DIAGNOSIS — R21 Rash and other nonspecific skin eruption: Secondary | ICD-10-CM

## 2022-03-13 DIAGNOSIS — Z8639 Personal history of other endocrine, nutritional and metabolic disease: Secondary | ICD-10-CM

## 2022-03-13 DIAGNOSIS — E559 Vitamin D deficiency, unspecified: Secondary | ICD-10-CM

## 2022-03-13 DIAGNOSIS — M5030 Other cervical disc degeneration, unspecified cervical region: Secondary | ICD-10-CM

## 2022-03-13 DIAGNOSIS — R768 Other specified abnormal immunological findings in serum: Secondary | ICD-10-CM

## 2022-03-13 DIAGNOSIS — Z8719 Personal history of other diseases of the digestive system: Secondary | ICD-10-CM

## 2022-03-13 NOTE — Progress Notes (Signed)
? ?Office Visit Note ? ?Patient: Debbie Houston             ?Date of Birth: Mar 15, 1941           ?MRN: [id number]             ?PCP: Unk Pinto, MD ?Referring: Unk Pinto, MD ?Visit Date: 03/14/2022 ?Occupation: @GUAROCC @ ? ?Subjective:  ?Pain in both hands  ? ?History of Present Illness: Debbie Houston is a 81 y.o. female with history of positive ANA, osteoarthritis, and DDD. She continues to experience intermittent arthralgias and joint stiffness.   ?Patient reports that [place] and fractured her right hip.  She had persistent pain in the right hip despite undergoing surgical repair. She was found to have a nonunion fracture of the right hip.  She has been under the care of Dr. Zachery Dakins due to chronic pain in her right hip, and she recnetly underwent a total hip replacement on 01/22/2022.  She continues to have some discomfort but overall her pain and ROM are improving.  She takes tylenol for pain relief.  She continues to go to PT.   ?She has ongoing pain in both hands due to osteoarthritis.  Her right CMC joint has been causing increased discomfort recently. For the past 1 month she has had pain and signs of an infection at the nailbed of her right thumb and 5th fingertip.  She has been applying neosporin and wearing band-aids during the day and cotton gloves at night. ?She also is experiencing stiffness in both knee joints but denies any joint swelling.  ?She denies any new rashes or other autoimmune symptoms.  ? ? ?Activities of Daily Living:  ?Patient reports morning stiffness for all day. ?Patient Reports nocturnal pain.  ?Difficulty dressing/grooming: Denies ?Difficulty climbing stairs: Reports ?Difficulty getting out of chair: Denies ?Difficulty using hands for taps, buttons, cutlery, and/or writing: Reports ? ?Review of Systems  ?Constitutional:  Positive for fatigue.  ?HENT:  Positive for mouth sores, mouth dryness and nose dryness.   ?Eyes:  Positive for dryness.  Negative for pain and itching.  ?Respiratory:  Negative for shortness of breath and difficulty breathing.   ?Cardiovascular:  Negative for chest pain and palpitations.  ?Gastrointestinal:  Positive for diarrhea. Negative for blood in stool and constipation.  ?Endocrine: Negative for increased urination.  ?Genitourinary:  Positive for involuntary urination. Negative for difficulty urinating.  ?Musculoskeletal:  Positive for joint pain, joint pain, joint swelling, myalgias, morning stiffness, muscle tenderness and myalgias.  ?Skin:  Negative for color change, rash and redness.  ?Allergic/Immunologic: Negative for susceptible to infections.  ?Neurological:  Positive for dizziness, numbness, headaches and weakness. Negative for memory loss.  ?Hematological:  Positive for bruising/bleeding tendency.  ?Psychiatric/Behavioral:  Negative for confusion.   ? ?PMFS History:  ?Patient Active Problem List  ? Diagnosis Date Noted  ? Intertrochanteric fracture of right femur, closed, with nonunion, subsequent encounter 01/22/2022  ? History of ulcerative colitis 07/10/2021  ? Complete right bundle branch block (RBBB) 05/15/2021  ? Body mass index (BMI) of 19.0-19.9 in adult 05/12/2021  ? Bilateral nephrolithiasis 09/21/2020  ? Senile purpura ([place]) 08/17/2020  ? Bilateral temporomandibular joint pain 06/15/2020  ? Vertigo 03/25/2019  ? Spondylolisthesis at L4-L5 level 11/25/2018  ? Colitis 12/21/2014  ? Medication management 04/20/2014  ? Essential hypertension   ? Hyperlipidemia, mixed   ? Hypothyroid   ? GERD   ? Asthma   ? DJD (degenerative joint disease)   ?  Vitamin D deficiency   ? Osteoporosis with current pathological fracture   ? Allergy   ?  ?Past Medical History:  ?Diagnosis Date  ? Arthritis   ? Benign labile hypertension   ? Cancer [facility])   ? skin cancer on face  ? Colitis   ? DJD (degenerative joint disease)   ? Endometrial polyp   ? GERD (gastroesophageal reflux disease)   ? History of basal cell carcinoma excision   ?  History of esophagitis   ? HOH (hard of hearing)   ? right ear better than left  -- wears no aides  ? Hyperlipidemia   ? Hypothyroidism   ? Neuromuscular disorder ([place])   ? neuropathy in feet  ? Osteopenia   ? Prediabetes   ? S/P right hip fracture 08/17/2020  ? Vitamin D deficiency   ? Wears glasses   ?  ?Family History  ?Problem Relation Age of Onset  ? Hypertension Mother   ? Cancer Mother   ?     COLON  ? Hyperlipidemia Mother   ? Cancer Father   ?     BREAST WITH BRAIN METS  ? Hyperlipidemia Father   ? Cancer Sister   ?     COLON (TEENS), BREAST (50)  ? Hyperlipidemia Sister   ? Diabetes Brother   ? Hyperlipidemia Brother   ? Hypertension Brother   ? Colon cancer Brother 28  ? Cancer Maternal Grandmother   ?     RENAL  ? Hyperlipidemia Maternal Grandmother   ? Diabetes Maternal Grandfather   ? Stroke Maternal Grandfather   ? Hyperlipidemia Maternal Grandfather   ? Hyperlipidemia Paternal Grandmother   ? Hyperlipidemia Paternal Grandfather   ? Autism Son   ? Colitis Son   ? Autism spectrum disorder Son   ? Healthy Son   ? ?Past Surgical History:  ?Procedure Laterality Date  ? BACK SURGERY  2020  ? CATARACT EXTRACTION, BILATERAL Bilateral 2020  ? CONVERSION TO TOTAL HIP Right 01/22/2022  ? Procedure: CONVERSION TO TOTAL HIP;  Surgeon: Willaim Sheng, MD;  Location: [facility];  Service: Orthopedics;  Laterality: Right;  ? DILATATION & CURETTAGE/HYSTEROSCOPY WITH MYOSURE N/A 10/27/2014  ? Procedure: [place];  Surgeon: Darlyn Chamber, MD;  Location: [facility];  Service: Gynecology;  Laterality: N/A;  ? DILATION AND CURETTAGE OF UTERUS    ? HIP SURGERY Right 04/2020  ? rod and screw in right hip   ? MOHS SURGERY  2005  ? LEFT NASAL BRIDGE FOR BASAL CELL  ? TONSILLECTOMY  age 29  ? ?Social History  ? ?Social History Narrative  ? Not on file  ? ?Immunization History  ?Administered Date(s) Administered  ? Influenza, High Dose Seasonal PF 07/23/2014, 08/23/2015,  07/10/2016, 08/27/2017, 08/22/2018, 08/25/2019, 08/30/2020, 08/21/2021  ? PFIZER(Purple Top)SARS-COV-2 Vaccination 12/10/2019, 12/31/2019, 08/16/2020, 04/14/2021  ? Pneumococcal Conjugate-13 05/05/2015  ? Pneumococcal-Unspecified 08/22/2011  ? Tdap 08/22/2011  ? Zoster Recombinat (Shingrix) 09/13/2017, 01/01/2018  ? Zoster, Live 06/20/2006  ?  ? ?Objective: ?Vital Signs: BP (!) 141/81 (BP Location: Left Arm, Patient Position: Sitting, Cuff Size: Normal)   Pulse 71   Ht 5' 5"  (1.651 m)   Wt 111 lb 12.8 oz (50.7 kg)   BMI 18.60 kg/m?   ? ?Physical Exam ?Vitals and nursing note reviewed.  ?Constitutional:   ?   Appearance: She is well-developed.  ?HENT:  ?   Head: Normocephalic and atraumatic.  ?Eyes:  ?  Conjunctiva/sclera: Conjunctivae normal.  ?Cardiovascular:  ?   Rate and Rhythm: Normal rate and regular rhythm.  ?   Heart sounds: Normal heart sounds.  ?Pulmonary:  ?   Effort: Pulmonary effort is normal.  ?   Breath sounds: Normal breath sounds.  ?Abdominal:  ?   General: Bowel sounds are normal.  ?   Palpations: Abdomen is soft.  ?Musculoskeletal:  ?   Cervical back: Normal range of motion.  ?Skin: ?   General: Skin is warm and dry.  ?   Capillary Refill: Capillary refill takes less than 2 seconds.  ?   Comments: Paronychia-right 1st and 5th digit.  No fluctuance noted.    ?Neurological:  ?   Mental Status: She is alert and oriented to person, place, and time.  ?Psychiatric:     ?   Behavior: Behavior normal.  ?  ? ?Musculoskeletal Exam: C-spine has limited range of motion.  No midline spinal tenderness or SI joint tenderness was noted.  Shoulder joints, elbow joints, and wrist joints have good range of motion.  Severe PIP and DIP thickening consistent with osteoarthritis of both hands.  Subluxation of all DIP joints noted.  Severe CMC joint prominence and thickening noted.  Tenderness over the right CMC joint noted.  Incomplete fist formation due to the severity of osteoarthritis in her hands.  Right hip  replacement has good range of motion with minimal discomfort.  Left hip has good range of motion with no groin pain.  Knee joints have good range of motion with some crepitus bilaterally.  Ankle joints have good ra

## 2022-03-14 ENCOUNTER — Encounter: Payer: Self-pay | Admitting: Physician Assistant

## 2022-03-14 ENCOUNTER — Ambulatory Visit (INDEPENDENT_AMBULATORY_CARE_PROVIDER_SITE_OTHER): Payer: Medicare Other | Admitting: Physician Assistant

## 2022-03-14 ENCOUNTER — Encounter: Payer: Self-pay | Admitting: Nurse Practitioner

## 2022-03-14 ENCOUNTER — Ambulatory Visit (INDEPENDENT_AMBULATORY_CARE_PROVIDER_SITE_OTHER): Payer: Medicare Other | Admitting: Nurse Practitioner

## 2022-03-14 VITALS — BP 141/81 | HR 71 | Ht 65.0 in | Wt 111.8 lb

## 2022-03-14 VITALS — BP 128/70 | HR 76 | Temp 97.9°F | Resp 16 | Ht 65.0 in | Wt 112.4 lb

## 2022-03-14 DIAGNOSIS — R768 Other specified abnormal immunological findings in serum: Secondary | ICD-10-CM | POA: Diagnosis not present

## 2022-03-14 DIAGNOSIS — M5030 Other cervical disc degeneration, unspecified cervical region: Secondary | ICD-10-CM

## 2022-03-14 DIAGNOSIS — M19042 Primary osteoarthritis, left hand: Secondary | ICD-10-CM

## 2022-03-14 DIAGNOSIS — Z8719 Personal history of other diseases of the digestive system: Secondary | ICD-10-CM | POA: Diagnosis not present

## 2022-03-14 DIAGNOSIS — I10 Essential (primary) hypertension: Secondary | ICD-10-CM | POA: Diagnosis not present

## 2022-03-14 DIAGNOSIS — R21 Rash and other nonspecific skin eruption: Secondary | ICD-10-CM

## 2022-03-14 DIAGNOSIS — M4316 Spondylolisthesis, lumbar region: Secondary | ICD-10-CM

## 2022-03-14 DIAGNOSIS — M19041 Primary osteoarthritis, right hand: Secondary | ICD-10-CM | POA: Diagnosis not present

## 2022-03-14 DIAGNOSIS — R42 Dizziness and giddiness: Secondary | ICD-10-CM

## 2022-03-14 DIAGNOSIS — R778 Other specified abnormalities of plasma proteins: Secondary | ICD-10-CM

## 2022-03-14 DIAGNOSIS — Z8639 Personal history of other endocrine, nutritional and metabolic disease: Secondary | ICD-10-CM

## 2022-03-14 DIAGNOSIS — M19072 Primary osteoarthritis, left ankle and foot: Secondary | ICD-10-CM

## 2022-03-14 DIAGNOSIS — E559 Vitamin D deficiency, unspecified: Secondary | ICD-10-CM | POA: Diagnosis not present

## 2022-03-14 DIAGNOSIS — Z8709 Personal history of other diseases of the respiratory system: Secondary | ICD-10-CM

## 2022-03-14 DIAGNOSIS — L03011 Cellulitis of right finger: Secondary | ICD-10-CM | POA: Diagnosis not present

## 2022-03-14 DIAGNOSIS — M8589 Other specified disorders of bone density and structure, multiple sites: Secondary | ICD-10-CM

## 2022-03-14 DIAGNOSIS — M19071 Primary osteoarthritis, right ankle and foot: Secondary | ICD-10-CM | POA: Diagnosis not present

## 2022-03-14 DIAGNOSIS — R519 Headache, unspecified: Secondary | ICD-10-CM | POA: Diagnosis not present

## 2022-03-14 DIAGNOSIS — Z96641 Presence of right artificial hip joint: Secondary | ICD-10-CM

## 2022-03-14 NOTE — Progress Notes (Signed)
Assessment and Plan: ? ?Debbie Houston was seen today for an episodic visit. ? ?Diagnoses and all order for this visit: ? ? ?1. Acute nonintractable headache, unspecified headache type ?Lie in darkened room and apply cold packs as needed for pain. ?Asked to keep headache diary. ?Stay well hydrated. ?Report to ER for any worsening symptoms. ? ? ?2. Dizziness ? ?- CBC with Differential/Platelet ?- COMPLETE METABOLIC PANEL WITH GFR ? ?3. Rash and nonspecific skin eruption ?Continue current treatment. ?Continue to monitor. ?Contact office for any worsening symptoms. ? ?Notify office for further evaluation and treatment, questions or concerns if s/s fail to improve. The risks and benefits of my recommendations, as well as other treatment options were discussed with the patient today. Questions were answered. ? ?Further disposition pending results of labs. Discussed med's effects and SE's.   ? ?Over 15 minutes of exam, counseling, chart review, and critical decision making was performed.  ? ?Future Appointments  ?Date Time Provider [place]  ?03/21/2022 11:30 AM [facility] NURSE [facility]  ?04/24/2022 10:30 AM Tolia, Sunit, DO [facility] None  ?05/15/2022  3:00 PM Unk Pinto, MD [facility] None  ?06/05/2022 10:30 AM Carleene Mains, RPH [facility] None  ?08/21/2022 10:00 AM Magda Bernheim, NP [facility] None  ?09/14/2022 11:00 AM Bo Merino, MD [facility] None  ? ? ?------------------------------------------------------------------------------------------------------------------ ? ? ?HPI ?BP 128/70   Pulse 76   Temp 97.9 ?F (36.6 ?C)   Resp 16   Ht 5' 5"  (1.651 m)   Wt 112 lb 6.4 oz (51 kg)   SpO2 98%   BMI 18.70 kg/m?  ? ? Debbie Houston is a 81 y.o. female who presents for evaluation of headache. Symptoms began about 7 days ago. Generally, the headaches a week and occur continuously. The headaches do not seem to be related to any time of the day. The headaches are usually moderate, dull, and throbbing and are  located in the front part of the head.  The patient rates her most severe headaches a 5 on a scale from 1 to 10. Recently, the headaches have been increasing in severity. Work attendance or other daily activities are affected by the headaches. Precipitating factors include: none which have been determined. The headaches are usually not preceded by an aura. Associated neurologic symptoms:  none . The patient denies decreased physical activity, dizziness, loss of balance, muscle weakness, numbness of extremities, speech difficulties, vision problems, and vomiting in the early morning. Home treatment has included acetaminophen, darkening the room, resting, and sleeping with some improvement. Other history includes: allergic rhinitis. Family history includes no known family members with significant headaches. ? ?She is also concerned for a rash along her nail bed of bilateral hands that has been present for the last 3 days.  She has applied OTC neosporin abx cream with some relief.  Denies any fever, chills, N/V, spreading of rash.   ? ? ? ?Past Medical History:  ?Diagnosis Date  ? Arthritis   ? Benign labile hypertension   ? Cancer [facility])   ? skin cancer on face  ? Colitis   ? DJD (degenerative joint disease)   ? Endometrial polyp   ? GERD (gastroesophageal reflux disease)   ? History of basal cell carcinoma excision   ? History of esophagitis   ? HOH (hard of hearing)   ? right ear better than left  -- wears no aides  ? Hyperlipidemia   ? Hypothyroidism   ? Neuromuscular disorder ([place])   ?  neuropathy in feet  ? Osteopenia   ? Prediabetes   ? S/P right hip fracture 08/17/2020  ? Vitamin D deficiency   ? Wears glasses   ?  ? ?Allergies  ?Allergen Reactions  ? Fosamax [Alendronate Sodium] Other (See Comments)  ?  GI upset   ? Singulair [Montelukast Sodium] Other (See Comments)  ?  Makes pt jittery  ? Wellbutrin [Bupropion] Hives  ? Clindamycin/Lincomycin Rash  ? Sulfa Antibiotics Rash  ? ? ?Current Outpatient Medications  on File Prior to Visit  ?Medication Sig  ? acetaminophen (TYLENOL) 500 MG tablet Take 500 mg by mouth at bedtime as needed for moderate pain or headache.   ? Ascorbic Acid (VITAMIN C) 1000 MG tablet Take 1,000 mg by mouth daily.   ? aspirin EC 81 MG tablet Take 1 tablet (81 mg total) by mouth in the morning and at bedtime.  ? bisoprolol (ZEBETA) 5 MG tablet TAKE 1/2 TABLET BY MOUTH EVERY DAY IN THE EVENING FOR BLOOD PRESSURE  ? Calcium Carbonate-Vit D-Min (CALCIUM 1200 PO) Take 1,200 mg by mouth daily.  ? cetirizine (ZYRTEC) 10 MG tablet Take 10 mg by mouth at bedtime.  ? CINNAMON PO Take 2,000 mg by mouth daily.  ? Coenzyme Q10 (COQ10) 200 MG CAPS Take 200 mg by mouth daily.  ? colestipol (COLESTID) 1 g tablet Take 4 g by mouth daily.  ? levothyroxine (SYNTHROID) 125 MCG tablet TAKE 1 TABLET BY MOUTH DAILY ON AN EMPTY STOMACH WITH ONLY WATER FOR 30 MINUTES(NO ANTACIDS MEDICATIONS, CALCIUM OR MAGNESIUM FOR 4 HOURS) (Patient taking differently: Take 125 mcg by mouth every other day.)  ? Multiple Vitamin (THERA VITAMIN PO) Take 1 tablet by mouth daily.  ? Omega-3 Fatty Acids (FISH OIL) 1200 MG CAPS Take 1,200 mg by mouth daily.   ? pantoprazole (PROTONIX) 40 MG tablet Take 1 tablet  2 x /day  for Heartburn & Indigestion  ? Probiotic Product (PROBIOTIC PO) Take 1 capsule by mouth daily.  ? PROLIA 60 MG/ML SOSY injection Inject 60 mg into the skin every 6 (six) months.  ? simvastatin (ZOCOR) 20 MG tablet Take  1 tablet  at Bedtime  for Cholesterol                                                 /                        TAKE             BY MOUTH  ? Vedolizumab (ENTYVIO IV) Inject 1 Dose into the vein every 30 (thirty) days.  ? apixaban (ELIQUIS) 5 MG TABS tablet Take 1 tablet (5 mg total) by mouth 2 (two) times daily.  ? dexamethasone (DECADRON) 2 MG tablet Take 1 tab 3 x day for 3 days, then 2 x day for  3 days, then 1 tab daily for 5 days then 1 tablet every other day (Patient not taking: Reported on 01/05/2022)  ?  loratadine (CLARITIN) 10 MG tablet Take 10 mg by mouth daily as needed for allergies. (Patient not taking: Reported on 03/14/2022)  ? Polyethyl Glycol-Propyl Glycol 0.4-0.3 % SOLN Place 1 drop into both eyes at bedtime. (Patient not taking: Reported on 03/14/2022)  ? pseudoephedrine (SUDAFED) 120 MG 12 hr tablet Take  1 tablet  2 x /day (every 12 hours)  for Head and Chest Congestion (Patient not taking: Reported on 01/05/2022)  ? ?No current facility-administered medications on file prior to visit.  ? ? ?ROS: all negative except what is noted in the HPI.  ? ?Physical Exam: ? ?BP 128/70   Pulse 76   Temp 97.9 ?F (36.6 ?C)   Resp 16   Ht 5' 5"  (1.651 m)   Wt 112 lb 6.4 oz (51 kg)   SpO2 98%   BMI 18.70 kg/m?  ? ?General Appearance: NAD.  Awake, conversant and cooperative. ?Eyes: PERRLA, EOMs intact.  Sclera white.  Conjunctiva without erythema. ?Sinuses: No frontal/maxillary tenderness.  No nasal discharge. Nares patent.  ?ENT/Mouth: Ext aud canals clear.  Bilateral TMs w/DOL and without erythema or bulging. Hearing intact.  Posterior pharynx without swelling or exudate.  Tonsils without swelling or erythema.  ?Neck: Supple.  No masses, nodules or thyromegaly. ?Respiratory: Effort is regular with non-labored breathing. Breath sounds are equal bilaterally without rales, rhonchi, wheezing or stridor.  ?Cardio: RRR with no MRGs. Brisk peripheral pulses without edema.  ?Abdomen: Active BS in all four quadrants.  Soft and non-tender without guarding, rebound tenderness, hernias or masses. ?Lymphatics: Non tender without lymphadenopathy.  ?Musculoskeletal: Full ROM, 5/5 strength, normal ambulation.  No clubbing or cyanosis. ?Skin: Right index and pinky finger with mild erythema along nail bed.  Left index finger with mild erythema along nail bed.   ?Neuro: CN II-XII grossly normal. Normal muscle tone without cerebellar symptoms and intact sensation.   ?Psych: AO X 3,  appropriate mood and affect, insight and judgment.   ?  ? ?Darrol Jump, NP ?2:52 PM ?[facility] ? ?

## 2022-03-15 DIAGNOSIS — M1611 Unilateral primary osteoarthritis, right hip: Secondary | ICD-10-CM | POA: Diagnosis not present

## 2022-03-15 DIAGNOSIS — M25651 Stiffness of right hip, not elsewhere classified: Secondary | ICD-10-CM | POA: Diagnosis not present

## 2022-03-15 DIAGNOSIS — R262 Difficulty in walking, not elsewhere classified: Secondary | ICD-10-CM | POA: Diagnosis not present

## 2022-03-15 DIAGNOSIS — Z96641 Presence of right artificial hip joint: Secondary | ICD-10-CM | POA: Diagnosis not present

## 2022-03-15 DIAGNOSIS — M6281 Muscle weakness (generalized): Secondary | ICD-10-CM | POA: Diagnosis not present

## 2022-03-15 LAB — CBC WITH DIFFERENTIAL/PLATELET
Absolute Monocytes: 581 cells/uL (ref 200–950)
Basophils Absolute: 40 cells/uL (ref 0–200)
Basophils Relative: 0.9 %
Eosinophils Absolute: 172 cells/uL (ref 15–500)
Eosinophils Relative: 3.9 %
HCT: 33 % — ABNORMAL LOW (ref 35.0–45.0)
Hemoglobin: 10.5 g/dL — ABNORMAL LOW (ref 11.7–15.5)
Lymphs Abs: 1575 cells/uL (ref 850–3900)
MCH: 30.3 pg (ref 27.0–33.0)
MCHC: 31.8 g/dL — ABNORMAL LOW (ref 32.0–36.0)
MCV: 95.1 fL (ref 80.0–100.0)
MPV: 10.8 fL (ref 7.5–12.5)
Monocytes Relative: 13.2 %
Neutro Abs: 2033 cells/uL (ref 1500–7800)
Neutrophils Relative %: 46.2 %
Platelets: 272 10*3/uL (ref 140–400)
RBC: 3.47 10*6/uL — ABNORMAL LOW (ref 3.80–5.10)
RDW: 13.5 % (ref 11.0–15.0)
Total Lymphocyte: 35.8 %
WBC: 4.4 10*3/uL (ref 3.8–10.8)

## 2022-03-15 LAB — COMPLETE METABOLIC PANEL WITH GFR
AG Ratio: 2.1 (calc) (ref 1.0–2.5)
ALT: 12 U/L (ref 6–29)
AST: 19 U/L (ref 10–35)
Albumin: 4 g/dL (ref 3.6–5.1)
Alkaline phosphatase (APISO): 54 U/L (ref 37–153)
BUN: 13 mg/dL (ref 7–25)
CO2: 28 mmol/L (ref 20–32)
Calcium: 9.3 mg/dL (ref 8.6–10.4)
Chloride: 106 mmol/L (ref 98–110)
Creat: 0.83 mg/dL (ref 0.60–0.95)
Globulin: 1.9 g/dL (calc) (ref 1.9–3.7)
Glucose, Bld: 104 mg/dL — ABNORMAL HIGH (ref 65–99)
Potassium: 4 mmol/L (ref 3.5–5.3)
Sodium: 140 mmol/L (ref 135–146)
Total Bilirubin: 0.4 mg/dL (ref 0.2–1.2)
Total Protein: 5.9 g/dL — ABNORMAL LOW (ref 6.1–8.1)
eGFR: 71 mL/min/{1.73_m2} (ref >=60)

## 2022-03-19 ENCOUNTER — Telehealth: Payer: Self-pay | Admitting: Nurse Practitioner

## 2022-03-19 DIAGNOSIS — R197 Diarrhea, unspecified: Principal | ICD-10-CM

## 2022-03-19 DIAGNOSIS — K529 Noninfective gastroenteritis and colitis, unspecified: Principal | ICD-10-CM

## 2022-03-19 DIAGNOSIS — M810 Age-related osteoporosis without current pathological fracture: Secondary | ICD-10-CM | POA: Diagnosis not present

## 2022-03-19 DIAGNOSIS — M7989 Other specified soft tissue disorders: Secondary | ICD-10-CM | POA: Diagnosis not present

## 2022-03-19 NOTE — Telephone Encounter (Signed)
Pt said her finger is not getting any better, she tried to send a message through my chart but wasn't able to. Wanting to know what she should do next.  ?

## 2022-03-21 ENCOUNTER — Ambulatory Visit (INDEPENDENT_AMBULATORY_CARE_PROVIDER_SITE_OTHER): Payer: Medicare Other | Admitting: *Deleted

## 2022-03-21 ENCOUNTER — Encounter: Payer: Self-pay | Admitting: *Deleted

## 2022-03-21 ENCOUNTER — Other Ambulatory Visit: Payer: Self-pay | Admitting: Nurse Practitioner

## 2022-03-21 DIAGNOSIS — M6281 Muscle weakness (generalized): Secondary | ICD-10-CM | POA: Diagnosis not present

## 2022-03-21 DIAGNOSIS — M8000XG Age-related osteoporosis with current pathological fracture, unspecified site, subsequent encounter for fracture with delayed healing: Secondary | ICD-10-CM | POA: Diagnosis not present

## 2022-03-21 DIAGNOSIS — R21 Rash and other nonspecific skin eruption: Secondary | ICD-10-CM

## 2022-03-21 DIAGNOSIS — M1611 Unilateral primary osteoarthritis, right hip: Secondary | ICD-10-CM | POA: Diagnosis not present

## 2022-03-21 DIAGNOSIS — K529 Noninfective gastroenteritis and colitis, unspecified: Secondary | ICD-10-CM | POA: Diagnosis not present

## 2022-03-21 DIAGNOSIS — R197 Diarrhea, unspecified: Secondary | ICD-10-CM | POA: Diagnosis not present

## 2022-03-21 DIAGNOSIS — Z96641 Presence of right artificial hip joint: Secondary | ICD-10-CM | POA: Diagnosis not present

## 2022-03-21 DIAGNOSIS — M25651 Stiffness of right hip, not elsewhere classified: Secondary | ICD-10-CM | POA: Diagnosis not present

## 2022-03-21 DIAGNOSIS — R262 Difficulty in walking, not elsewhere classified: Secondary | ICD-10-CM | POA: Diagnosis not present

## 2022-03-21 MED ORDER — CEPHALEXIN 500 MG PO CAPS: ORAL_CAPSULE | ORAL | 0 refills | Status: DC

## 2022-03-21 NOTE — Progress Notes (Signed)
Patient is here for nurse visit for her 3rd Evenity injection. Patient received bilateral [place] arm injections. She tolerated injections well. She will return in 1 month for her next Evenity injection.  ?

## 2022-03-28 ENCOUNTER — Telehealth: Admit: 2022-03-28 | Discharge: 2022-03-29 | Payer: MEDICARE | Attending: Internal Medicine | Primary: Internal Medicine

## 2022-03-28 DIAGNOSIS — M810 Age-related osteoporosis without current pathological fracture: Secondary | ICD-10-CM | POA: Diagnosis not present

## 2022-03-28 DIAGNOSIS — R5383 Other fatigue: Secondary | ICD-10-CM | POA: Diagnosis not present

## 2022-03-28 DIAGNOSIS — R799 Abnormal finding of blood chemistry, unspecified: Secondary | ICD-10-CM | POA: Diagnosis not present

## 2022-03-28 DIAGNOSIS — M2550 Pain in unspecified joint: Secondary | ICD-10-CM | POA: Diagnosis not present

## 2022-03-28 DIAGNOSIS — E559 Vitamin D deficiency, unspecified: Secondary | ICD-10-CM | POA: Diagnosis not present

## 2022-03-28 DIAGNOSIS — M19071 Primary osteoarthritis, right ankle and foot: Secondary | ICD-10-CM | POA: Diagnosis not present

## 2022-03-28 DIAGNOSIS — R197 Diarrhea, unspecified: Secondary | ICD-10-CM | POA: Diagnosis not present

## 2022-03-28 DIAGNOSIS — K529 Noninfective gastroenteritis and colitis, unspecified: Secondary | ICD-10-CM | POA: Diagnosis not present

## 2022-03-28 DIAGNOSIS — K52839 Microscopic colitis, unspecified: Secondary | ICD-10-CM | POA: Diagnosis not present

## 2022-03-28 DIAGNOSIS — K5100 Ulcerative (chronic) pancolitis without complications: Secondary | ICD-10-CM | POA: Diagnosis not present

## 2022-03-28 DIAGNOSIS — Z1159 Encounter for screening for other viral diseases: Secondary | ICD-10-CM | POA: Diagnosis not present

## 2022-03-28 MED ORDER — LOPERAMIDE 2 MG TABLET
ORAL_TABLET | Freq: Four times a day (QID) | ORAL | 3 refills | 30 days | Status: CP | PRN
Start: 2022-03-28 — End: ?

## 2022-03-29 DIAGNOSIS — R197 Diarrhea, unspecified: Secondary | ICD-10-CM | POA: Diagnosis not present

## 2022-03-29 DIAGNOSIS — K5100 Ulcerative (chronic) pancolitis without complications: Secondary | ICD-10-CM | POA: Diagnosis not present

## 2022-03-29 DIAGNOSIS — Z1159 Encounter for screening for other viral diseases: Secondary | ICD-10-CM | POA: Diagnosis not present

## 2022-03-29 DIAGNOSIS — R799 Abnormal finding of blood chemistry, unspecified: Secondary | ICD-10-CM | POA: Diagnosis not present

## 2022-03-29 DIAGNOSIS — K529 Noninfective gastroenteritis and colitis, unspecified: Secondary | ICD-10-CM | POA: Diagnosis not present

## 2022-04-03 ENCOUNTER — Telehealth: Payer: Self-pay | Admitting: Family Medicine

## 2022-04-03 ENCOUNTER — Other Ambulatory Visit (HOSPITAL_COMMUNITY): Payer: Self-pay | Admitting: Sports Medicine

## 2022-04-03 ENCOUNTER — Ambulatory Visit (HOSPITAL_COMMUNITY)
Admission: RE | Admit: 2022-04-03 | Discharge: 2022-04-03 | Disposition: A | Discharge status: Home / Self Care | Payer: Medicare Other | Source: Ambulatory Visit | Attending: Cardiology | Admitting: Cardiology

## 2022-04-03 DIAGNOSIS — M79661 Pain in right lower leg: Secondary | ICD-10-CM | POA: Diagnosis not present

## 2022-04-03 DIAGNOSIS — M79662 Pain in left lower leg: Secondary | ICD-10-CM | POA: Diagnosis not present

## 2022-04-03 NOTE — Telephone Encounter (Signed)
Provider left note to move Evenity Appt 6/5 out 3wks --Cld pt left her a msg, no answer--glh ?

## 2022-04-05 ENCOUNTER — Encounter: Payer: Self-pay | Admitting: Nurse Practitioner

## 2022-04-05 ENCOUNTER — Ambulatory Visit (INDEPENDENT_AMBULATORY_CARE_PROVIDER_SITE_OTHER): Payer: Medicare Other | Admitting: Nurse Practitioner

## 2022-04-05 VITALS — BP 122/64 | HR 75 | Temp 97.7°F | Wt 114.0 lb

## 2022-04-05 DIAGNOSIS — M79644 Pain in right finger(s): Secondary | ICD-10-CM | POA: Diagnosis not present

## 2022-04-05 DIAGNOSIS — R600 Localized edema: Secondary | ICD-10-CM | POA: Diagnosis not present

## 2022-04-05 DIAGNOSIS — L539 Erythematous condition, unspecified: Secondary | ICD-10-CM | POA: Diagnosis not present

## 2022-04-05 DIAGNOSIS — S41101A Unspecified open wound of right upper arm, initial encounter: Secondary | ICD-10-CM

## 2022-04-05 MED ORDER — CEPHALEXIN 500 MG PO CAPS: ORAL_CAPSULE | ORAL | 0 refills | Status: DC

## 2022-04-05 NOTE — Progress Notes (Signed)
Assessment and Plan:  Debbie Houston was seen today for an episodic visit.  Diagnoses and all order for this visit:   1. Non-healing wound of right upper extremity Suggest sugar/betadine slurry mixture to apply to affected right 5th nail bed. May continue to soak in Epsom salt. If not healed in 2 weeks, consider culture.   - cephALEXin (KEFLEX) 500 MG capsule; Take 1 capsule 4 x/day after meals & bedtime for infection  Dispense: 20 capsule; Refill: 0  2. Finger pain, right Continue Tylenol PRN  3. Erythema Continue to monitor for any increase in erythema, streaking, fever, chills. Notify office.    4. Localized edema Continue to monitor for any increase in erythema, streaking, fever, chills. Notify office.     Notify office for further evaluation and treatment, questions or concerns if s/s fail to improve. The risks and benefits of my recommendations, as well as other treatment options were discussed with the patient today. Questions were answered.  Further disposition pending results of labs. Discussed med's effects and SE's.    Over 15 minutes of exam, counseling, chart review, and critical decision making was performed.   Future Appointments  Date Time Provider [place]  04/23/2022 11:15 AM [facility] NURSE [facility]  04/24/2022 10:30 AM Rex Kras, DO [facility] None  05/15/2022  3:00 PM Unk Pinto, MD [facility] None  06/05/2022 10:30 AM Carleene Mains, RPH [facility] None  08/21/2022 10:00 AM Magda Bernheim, NP [facility] None  09/14/2022 11:00 AM Bo Merino, MD [facility] None    ------------------------------------------------------------------------------------------------------------------   HPI BP 122/64   Pulse 75   Temp 97.7 F (36.5 C)   Wt 114 lb (51.7 kg)   SpO2 98%   BMI 18.97 kg/m    Debbie Houston is a 81 y.o. female who presents for evaluation of non healing wound on right 5th digit of the finger.  She last saw Rheumatology 03/14/22.   She has ongoing pain in both hands due to osteoarthritis.  Her right CMC joint has been causing increased discomfort. For the past 1 month she has had pain and signs of an infection at the nailbed of her right thumb and 5th fingertip.  She has been applying neosporin and wearing band-aids during the day and cotton gloves at night.  She was seen last OV and treated with Keflex.  She noticed improvement in other areas that were affected, but the 5th digit has continued to remain painful, erythematous, edematous.  She has been soaking in warm epsom salt and taking Tylenol with some effectiveness.    Of note, she has a hx of UC and reports flaring over the last several weeks.     Past Medical History:  Diagnosis Date   Arthritis    Benign labile hypertension    Cancer ([facility])    skin cancer on face   Colitis    DJD (degenerative joint disease)    Endometrial polyp    GERD (gastroesophageal reflux disease)    History of basal cell carcinoma excision    History of esophagitis    HOH (hard of hearing)    right ear better than left  -- wears no aides   Hyperlipidemia    Hypothyroidism    Neuromuscular disorder ([place])    neuropathy in feet   Osteopenia    Prediabetes    S/P right hip fracture 08/17/2020   Vitamin D deficiency    Wears glasses      Allergies  Allergen Reactions  Fosamax [Alendronate Sodium] Other (See Comments)    GI upset    Singulair [Montelukast Sodium] Other (See Comments)    Makes pt jittery   Wellbutrin [Bupropion] Hives   Clindamycin/Lincomycin Rash   Sulfa Antibiotics Rash    Current Outpatient Medications on File Prior to Visit  Medication Sig   acetaminophen (TYLENOL) 500 MG tablet Take 500 mg by mouth at bedtime as needed for moderate pain or headache.    Ascorbic Acid (VITAMIN C) 1000 MG tablet Take 1,000 mg by mouth daily.    aspirin EC 81 MG tablet Take 1 tablet (81 mg total) by mouth in the morning and at bedtime.   bisoprolol (ZEBETA) 5 MG tablet  TAKE 1/2 TABLET BY MOUTH EVERY DAY IN THE EVENING FOR BLOOD PRESSURE   Calcium Carbonate-Vit D-Min (CALCIUM 1200 PO) Take 1,200 mg by mouth daily.   cetirizine (ZYRTEC) 10 MG tablet Take 10 mg by mouth at bedtime.   CINNAMON PO Take 2,000 mg by mouth daily.   Coenzyme Q10 (COQ10) 200 MG CAPS Take 200 mg by mouth daily.   colestipol (COLESTID) 1 g tablet Take 4 g by mouth daily.   cycloSPORINE (RESTASIS) 0.05 % ophthalmic emulsion Place 1 drop into both eyes 2 (two) times daily.   levothyroxine (SYNTHROID) 125 MCG tablet TAKE 1 TABLET BY MOUTH DAILY ON AN EMPTY STOMACH WITH ONLY WATER FOR 30 MINUTES(NO ANTACIDS MEDICATIONS, CALCIUM OR MAGNESIUM FOR 4 HOURS) (Patient taking differently: Take 125 mcg by mouth every other day.)   loratadine (CLARITIN) 10 MG tablet Take 10 mg by mouth daily as needed for allergies.   Multiple Vitamin (THERA VITAMIN PO) Take 1 tablet by mouth daily.   Omega-3 Fatty Acids (FISH OIL) 1200 MG CAPS Take 1,200 mg by mouth daily.    pantoprazole (PROTONIX) 40 MG tablet Take 1 tablet  2 x /day  for Heartburn & Indigestion   Probiotic Product (PROBIOTIC PO) Take 1 capsule by mouth daily.   PROLIA 60 MG/ML SOSY injection Inject 60 mg into the skin every 6 (six) months.   simvastatin (ZOCOR) 20 MG tablet Take  1 tablet  at Bedtime  for Cholesterol                                                 /                        TAKE             BY MOUTH   Vedolizumab (ENTYVIO IV) Inject 1 Dose into the vein every 30 (thirty) days.   apixaban (ELIQUIS) 5 MG TABS tablet Take 1 tablet (5 mg total) by mouth 2 (two) times daily.   cephALEXin (KEFLEX) 500 MG capsule Take 1 capsule 4 x/day after meals & bedtime for infection (Patient not taking: Reported on 04/05/2022)   dexamethasone (DECADRON) 2 MG tablet Take 1 tab 3 x day for 3 days, then 2 x day for  3 days, then 1 tab daily for 5 days then 1 tablet every other day (Patient not taking: Reported on 01/05/2022)   Polyethyl Glycol-Propyl Glycol  0.4-0.3 % SOLN Place 1 drop into both eyes at bedtime. (Patient not taking: Reported on 03/14/2022)   pseudoephedrine (SUDAFED) 120 MG 12 hr tablet Take  1 tablet  2 x /day (  every 12 hours)  for Head and Chest Congestion (Patient not taking: Reported on 01/05/2022)   No current facility-administered medications on file prior to visit.    ROS: all negative except what is noted in the HPI.   Physical Exam:  BP 122/64   Pulse 75   Temp 97.7 F (36.5 C)   Wt 114 lb (51.7 kg)   SpO2 98%   BMI 18.97 kg/m   General Appearance: NAD.  Awake, conversant and cooperative. Eyes: PERRLA, EOMs intact.  Sclera white.  Conjunctiva without erythema. Sinuses: No frontal/maxillary tenderness.  No nasal discharge. Nares patent.  ENT/Mouth: Ext aud canals clear.  Bilateral TMs w/DOL and without erythema or bulging. Hearing intact.  Posterior pharynx without swelling or exudate.  Tonsils without swelling or erythema.  Neck: Supple.  No masses, nodules or thyromegaly. Respiratory: Effort is regular with non-labored breathing. Breath sounds are equal bilaterally without rales, rhonchi, wheezing or stridor.  Cardio: RRR with no MRGs. Brisk peripheral pulses without edema.  Abdomen: Active BS in all four quadrants.  Soft and non-tender without guarding, rebound tenderness, hernias or masses. Lymphatics: Non tender without lymphadenopathy.  Musculoskeletal: Full ROM, 5/5 strength, normal ambulation.  No clubbing or cyanosis. Skin: Right 5th nail bed with moderate erythema along nail bed.   Neuro: CN II-XII grossly normal. Normal muscle tone without cerebellar symptoms and intact sensation.   Psych: AO X 3,  appropriate mood and affect, insight and judgment.     Darrol Jump, NP 11:49 AM [facility]

## 2022-04-05 NOTE — Patient Instructions (Signed)
Mix Betadine and White Sugar together to make a paste and apply to affected area up to three times a day.  Wound Care, Adult Taking care of your wound properly can help to prevent pain, infection, and scarring. It can also help your wound heal more quickly. Follow instructions from your health care provider about how to care for your wound. Supplies needed: Soap and water. Wound cleanser, saline, or germ-free (sterile) water. Gauze. If needed, a clean bandage (dressing) or other type of wound dressing material to cover or place in the wound. Follow your health care provider's instructions about what dressing supplies to use. Cream or topical ointment to apply to the wound, if told by your health care provider. How to care for your wound Cleaning the wound Ask your health care provider how to clean the wound. This may include: Using mild soap and water, a wound cleanser, saline, or sterile water. Using a clean gauze to pat the wound dry after cleaning it. Do not rub or scrub the wound. Dressing care Wash your hands with soap and water for at least 20 seconds before and after you change the dressing. If soap and water are not available, use hand sanitizer. Change your dressing as told by your health care provider. This may include: Cleaning or rinsing out (irrigating) the wound. Application of cream or topical ointment, if told by your health care provider. Placing a dressing over the wound or in the wound (packing). Covering the wound with an outer dressing. Leave stitches (sutures), staples, skin glue, or adhesive strips in place. These skin closures may need to stay in place for 2 weeks or longer. If adhesive strip edges start to loosen and curl up, you may trim the loose edges. Do not remove adhesive strips completely unless your health care provider tells you to do that. Ask your health care provider when you can leave the wound uncovered. Checking for infection Check your wound area  every day for signs of infection. Check for: More redness, swelling, or pain. Fluid or blood. Warmth. Pus or a bad smell.  Follow these instructions at home Medicines If you were prescribed an antibiotic medicine, cream, or ointment, take or apply it as told by your health care provider. Do not stop using the antibiotic even if your condition improves. If you were prescribed pain medicine, take it 30 minutes before you do any wound care or as told by your health care provider. Take over-the-counter and prescription medicines only as told by your health care provider. Eating and drinking Eat a diet that includes protein, vitamin A, vitamin C, and other nutrient-rich foods to help the wound heal. Foods rich in protein include meat, fish, eggs, dairy, beans, and nuts. Foods rich in vitamin A include carrots and dark green, leafy vegetables. Foods rich in vitamin C include citrus fruits, tomatoes, broccoli, and peppers. Drink enough fluid to keep your urine pale yellow. General instructions Do not take baths, swim, or use a hot tub until your health care provider approves. Ask your health care provider if you may take showers. You may only be allowed to take sponge baths. Do not scratch or pick at the wound. Keep it covered as told by your health care provider. Return to your normal activities as told by your health care provider. Ask your health care provider what activities are safe for you. Protect your wound from the sun when you are outside for the first 6 months, or for as long as told by  your health care provider. Cover up the scar area or apply sunscreen that has an SPF of at least 84. Do not use any products that contain nicotine or tobacco. These products include cigarettes, chewing tobacco, and vaping devices, such as e-cigarettes. If you need help quitting, ask your health care provider. Keep all follow-up visits. This is important. Contact a health care provider if: You received a  tetanus shot and you have swelling, severe pain, redness, or bleeding at the injection site. Your pain is not controlled with medicine. You have any of these signs of infection: More redness, swelling, or pain around the wound. Fluid or blood coming from the wound. Warmth coming from the wound. A fever or chills. You are nauseous or you vomit. You are dizzy. You have a new rash or hardness around the wound. Get help right away if: You have a red streak of skin near the area around your wound. Pus or a bad smell coming from the wound. Your wound has been closed with staples, sutures, skin glue, or adhesive strips and it begins to open up and separate. Your wound is bleeding, and the bleeding does not stop with gentle pressure. These symptoms may represent a serious problem that is an emergency. Do not wait to see if the symptoms will go away. Get medical help right away. Call your local emergency services (911 in the [place] Do not drive yourself to the hospital. Summary Always wash your hands with soap and water for at least 20 seconds before and after changing your dressing. Change your dressing as told by your health care provider. To help with healing, eat foods that are rich in protein, vitamin A, vitamin C, and other nutrients. Check your wound every day for signs of infection. Contact your health care provider if you think that your wound is infected. This information is not intended to replace advice given to you by your health care provider. Make sure you discuss any questions you have with your health care provider. Document Revised: 03/14/2021 Document Reviewed: 03/14/2021 Elsevier Patient Education  [place].

## 2022-04-11 DIAGNOSIS — M47812 Spondylosis without myelopathy or radiculopathy, cervical region: Secondary | ICD-10-CM | POA: Diagnosis not present

## 2022-04-13 DIAGNOSIS — M19071 Primary osteoarthritis, right ankle and foot: Secondary | ICD-10-CM | POA: Diagnosis not present

## 2022-04-13 DIAGNOSIS — M25551 Pain in right hip: Secondary | ICD-10-CM | POA: Diagnosis not present

## 2022-04-18 NOTE — Telephone Encounter (Signed)
Closing encounter

## 2022-04-19 ENCOUNTER — Ambulatory Visit: Payer: Medicare Other | Admitting: Nurse Practitioner

## 2022-04-19 DIAGNOSIS — M19071 Primary osteoarthritis, right ankle and foot: Secondary | ICD-10-CM | POA: Diagnosis not present

## 2022-04-22 NOTE — Progress Notes (Unsigned)
Assessment and Plan:  There are no diagnoses linked to this encounter.    Further disposition pending results of labs. Discussed med's effects and SE's.   Over 30 minutes of exam, counseling, chart review, and critical decision making was performed.   Future Appointments  Date Time Provider [place]  04/23/2022 10:30 AM Magda Bernheim, NP [facility] None  04/23/2022 11:15 AM [facility] NURSE [facility]  04/24/2022 10:30 AM Rex Kras, DO [facility] None  05/14/2022 10:30 AM Rosemarie Ax, MD [facility]  05/15/2022  3:00 PM Unk Pinto, MD [facility] None  06/05/2022 10:30 AM Carleene Mains, RPH [facility] None  08/21/2022 10:00 AM Magda Bernheim, NP [facility] None  09/14/2022 11:00 AM Bo Merino, MD [facility] None    ------------------------------------------------------------------------------------------------------------------   HPI There were no vitals taken for this visit. 81 y.o.female presents for  Past Medical History:  Diagnosis Date   Arthritis    Benign labile hypertension    Cancer ([place])    skin cancer on face   Colitis    DJD (degenerative joint disease)    Endometrial polyp    GERD (gastroesophageal reflux disease)    History of basal cell carcinoma excision    History of esophagitis    HOH (hard of hearing)    right ear better than left  -- wears no aides   Hyperlipidemia    Hypothyroidism    Neuromuscular disorder ([place])    neuropathy in feet   Osteopenia    Prediabetes    S/P right hip fracture 08/17/2020   Vitamin D deficiency    Wears glasses      Allergies  Allergen Reactions   Fosamax [Alendronate Sodium] Other (See Comments)    GI upset    Singulair [Montelukast Sodium] Other (See Comments)    Makes pt jittery   Wellbutrin [Bupropion] Hives   Clindamycin/Lincomycin Rash   Sulfa Antibiotics Rash    Current Outpatient Medications on File Prior to Visit  Medication Sig   acetaminophen (TYLENOL) 500 MG tablet Take 500 mg by mouth  at bedtime as needed for moderate pain or headache.    apixaban (ELIQUIS) 5 MG TABS tablet Take 1 tablet (5 mg total) by mouth 2 (two) times daily.   Ascorbic Acid (VITAMIN C) 1000 MG tablet Take 1,000 mg by mouth daily.    aspirin EC 81 MG tablet Take 1 tablet (81 mg total) by mouth in the morning and at bedtime.   bisoprolol (ZEBETA) 5 MG tablet TAKE 1/2 TABLET BY MOUTH EVERY DAY IN THE EVENING FOR BLOOD PRESSURE   Calcium Carbonate-Vit D-Min (CALCIUM 1200 PO) Take 1,200 mg by mouth daily.   cephALEXin (KEFLEX) 500 MG capsule Take 1 capsule 4 x/day after meals & bedtime for infection   cetirizine (ZYRTEC) 10 MG tablet Take 10 mg by mouth at bedtime.   CINNAMON PO Take 2,000 mg by mouth daily.   Coenzyme Q10 (COQ10) 200 MG CAPS Take 200 mg by mouth daily.   colestipol (COLESTID) 1 g tablet Take 4 g by mouth daily.   cycloSPORINE (RESTASIS) 0.05 % ophthalmic emulsion Place 1 drop into both eyes 2 (two) times daily.   dexamethasone (DECADRON) 2 MG tablet Take 1 tab 3 x day for 3 days, then 2 x day for  3 days, then 1 tab daily for 5 days then 1 tablet every other day (Patient not taking: Reported on 01/05/2022)   levothyroxine (SYNTHROID) 125 MCG tablet TAKE 1 TABLET BY MOUTH DAILY ON AN  EMPTY STOMACH WITH ONLY WATER FOR 30 MINUTES(NO ANTACIDS MEDICATIONS, CALCIUM OR MAGNESIUM FOR 4 HOURS) (Patient taking differently: Take 125 mcg by mouth every other day.)   loratadine (CLARITIN) 10 MG tablet Take 10 mg by mouth daily as needed for allergies.   Multiple Vitamin (THERA VITAMIN PO) Take 1 tablet by mouth daily.   Omega-3 Fatty Acids (FISH OIL) 1200 MG CAPS Take 1,200 mg by mouth daily.    pantoprazole (PROTONIX) 40 MG tablet Take 1 tablet  2 x /day  for Heartburn & Indigestion   Polyethyl Glycol-Propyl Glycol 0.4-0.3 % SOLN Place 1 drop into both eyes at bedtime. (Patient not taking: Reported on 03/14/2022)   Probiotic Product (PROBIOTIC PO) Take 1 capsule by mouth daily.   PROLIA 60 MG/ML SOSY  injection Inject 60 mg into the skin every 6 (six) months.   pseudoephedrine (SUDAFED) 120 MG 12 hr tablet Take  1 tablet  2 x /day (every 12 hours)  for Head and Chest Congestion (Patient not taking: Reported on 01/05/2022)   simvastatin (ZOCOR) 20 MG tablet Take  1 tablet  at Bedtime  for Cholesterol                                                 /                        TAKE             BY MOUTH   Vedolizumab (ENTYVIO IV) Inject 1 Dose into the vein every 30 (thirty) days.   No current facility-administered medications on file prior to visit.    ROS: all negative except above.   Physical Exam:  There were no vitals taken for this visit.  General Appearance: Well nourished, in no apparent distress. Eyes: PERRLA, EOMs, conjunctiva no swelling or erythema Sinuses: No Frontal/maxillary tenderness ENT/Mouth: Ext aud canals clear, TMs without erythema, bulging. No erythema, swelling, or exudate on post pharynx.  Tonsils not swollen or erythematous. Hearing normal.  Neck: Supple, thyroid normal.  Respiratory: Respiratory effort normal, BS equal bilaterally without rales, rhonchi, wheezing or stridor.  Cardio: RRR with no MRGs. Brisk peripheral pulses without edema.  Abdomen: Soft, + BS.  Non tender, no guarding, rebound, hernias, masses. Lymphatics: Non tender without lymphadenopathy.  Musculoskeletal: Full ROM, 5/5 strength, normal gait.  Skin: Warm, dry without rashes, lesions, ecchymosis.  Neuro: Cranial nerves intact. Normal muscle tone, no cerebellar symptoms. Sensation intact.  Psych: Awake and oriented X 3, normal affect, Insight and Judgment appropriate.     Magda Bernheim, NP 12:10 PM [facility]

## 2022-04-23 ENCOUNTER — Ambulatory Visit: Payer: Medicare Other

## 2022-04-23 ENCOUNTER — Ambulatory Visit (INDEPENDENT_AMBULATORY_CARE_PROVIDER_SITE_OTHER): Payer: Medicare Other | Admitting: Nurse Practitioner

## 2022-04-23 ENCOUNTER — Encounter: Payer: Self-pay | Admitting: Nurse Practitioner

## 2022-04-23 VITALS — BP 142/78 | HR 65 | Temp 97.5°F | Wt 109.0 lb

## 2022-04-23 DIAGNOSIS — I10 Essential (primary) hypertension: Secondary | ICD-10-CM | POA: Diagnosis not present

## 2022-04-23 DIAGNOSIS — S41101A Unspecified open wound of right upper arm, initial encounter: Secondary | ICD-10-CM

## 2022-04-23 MED ORDER — MUPIROCIN 2 % EX OINT: 1.0000 "application " | TOPICAL_OINTMENT | Freq: Two times a day (BID) | CUTANEOUS | 0 refills | Status: DC

## 2022-04-24 ENCOUNTER — Ambulatory Visit: Payer: Medicare Other | Admitting: Cardiology

## 2022-04-25 DIAGNOSIS — K51919 Ulcerative colitis, unspecified with unspecified complications: Secondary | ICD-10-CM | POA: Diagnosis not present

## 2022-04-27 ENCOUNTER — Ambulatory Visit: Payer: Medicare Other | Admitting: Cardiology

## 2022-04-27 ENCOUNTER — Encounter: Payer: Self-pay | Admitting: Cardiology

## 2022-04-27 VITALS — BP 126/81 | HR 64 | Temp 97.5°F | Resp 18 | Ht 65.0 in | Wt 108.6 lb

## 2022-04-27 DIAGNOSIS — Z87891 Personal history of nicotine dependence: Secondary | ICD-10-CM

## 2022-04-27 DIAGNOSIS — Z712 Person consulting for explanation of examination or test findings: Secondary | ICD-10-CM | POA: Diagnosis not present

## 2022-04-27 DIAGNOSIS — I4510 Unspecified right bundle-branch block: Secondary | ICD-10-CM

## 2022-04-27 DIAGNOSIS — E782 Mixed hyperlipidemia: Secondary | ICD-10-CM

## 2022-04-27 DIAGNOSIS — I10 Essential (primary) hypertension: Secondary | ICD-10-CM

## 2022-04-27 NOTE — Progress Notes (Signed)
Date:  04/27/2022   ID:  Debbie Houston, DOB 1941/01/20, MRN [id number]  PCP:  Darrol Jump, NP  Cardiologist:  Rex Kras, DO, [facility]  (established care 10/25/2021)  Date: 04/27/22 Last Office Visit: October 25, 2021 low  Chief Complaint  Patient presents with   Follow-up    Post surgery. Review test results.  Reevaluation of chest pain    HPI  Debbie Houston is a 81 y.o. Caucasian female whose past medical history and cardiovascular risk factors include:hypertension, right bundle branch block, hyperlipidemia, GERD, neuropathy, osteoporosis, former smoker, post menopausal, advance age.   Was referred to the practice back in December 2022 for preoperative risk ratification prior to right hip replacement.  Given her precordial discomfort, risk factors, and upcoming surgery at this shared decision was to proceed with an echo and stress test.  Results reviewed with her in great detail and noted below for further reference.  Patient has undergone surgery and had no cardiovascular complications and she is healing well.  She was actively doing water aerobics regularly but due to the recent surgery she has held off.  She has not had any recurrence of precordial chest pain since last office visit.  ALLERGIES: Allergies  Allergen Reactions   Fosamax [Alendronate Sodium] Other (See Comments)    GI upset    Singulair [Montelukast Sodium] Other (See Comments)    Makes pt jittery   Wellbutrin [Bupropion] Hives   Clindamycin/Lincomycin Rash   Sulfa Antibiotics Rash    MEDICATION LIST PRIOR TO VISIT: Current Meds  Medication Sig   acetaminophen (TYLENOL) 500 MG tablet Take 500 mg by mouth at bedtime as needed for moderate pain or headache.    Ascorbic Acid (VITAMIN C) 1000 MG tablet Take 1,000 mg by mouth daily.    aspirin EC 81 MG tablet Take 1 tablet (81 mg total) by mouth in the morning and at bedtime.   bisoprolol (ZEBETA) 5 MG tablet TAKE 1/2 TABLET BY MOUTH EVERY DAY IN THE  EVENING FOR BLOOD PRESSURE   Calcium Carbonate-Vit D-Min (CALCIUM 1200 PO) Take 1,200 mg by mouth daily.   cetirizine (ZYRTEC) 10 MG tablet Take 10 mg by mouth at bedtime.   CINNAMON PO Take 2,000 mg by mouth daily.   Coenzyme Q10 (COQ10) 200 MG CAPS Take 200 mg by mouth daily.   colestipol (COLESTID) 1 g tablet Take 4 g by mouth daily.   cycloSPORINE (RESTASIS) 0.05 % ophthalmic emulsion Place 1 drop into both eyes 2 (two) times daily.   levothyroxine (SYNTHROID) 125 MCG tablet TAKE 1 TABLET BY MOUTH DAILY ON AN EMPTY STOMACH WITH ONLY WATER FOR 30 MINUTES(NO ANTACIDS MEDICATIONS, CALCIUM OR MAGNESIUM FOR 4 HOURS) (Patient taking differently: Take 125 mcg by mouth every other day.)   loratadine (CLARITIN) 10 MG tablet Take 10 mg by mouth daily as needed for allergies.   Multiple Vitamin (THERA VITAMIN PO) Take 1 tablet by mouth daily.   mupirocin ointment (BACTROBAN) 2 % Apply 1 application. topically 2 (two) times daily.   Omega-3 Fatty Acids (FISH OIL) 1200 MG CAPS Take 1,200 mg by mouth daily.    pantoprazole (PROTONIX) 40 MG tablet Take 1 tablet  2 x /day  for Heartburn & Indigestion   Probiotic Product (PROBIOTIC PO) Take 1 capsule by mouth daily.   PROLIA 60 MG/ML SOSY injection Inject 60 mg into the skin every 6 (six) months.   pseudoephedrine (SUDAFED) 120 MG 12 hr tablet Take  1 tablet  2 x /  day (every 12 hours)  for Head and Chest Congestion   simvastatin (ZOCOR) 20 MG tablet Take  1 tablet  at Bedtime  for Cholesterol                                                 /                        TAKE             BY MOUTH     PAST MEDICAL HISTORY: Past Medical History:  Diagnosis Date   Arthritis    Benign labile hypertension    Cancer ([place])    skin cancer on face   Colitis    DJD (degenerative joint disease)    Endometrial polyp    GERD (gastroesophageal reflux disease)    History of basal cell carcinoma excision    History of esophagitis    HOH (hard of hearing)    right ear  better than left  -- wears no aides   Hyperlipidemia    Hypothyroidism    Neuromuscular disorder ([place])    neuropathy in feet   Osteopenia    Prediabetes    S/P right hip fracture 08/17/2020   Vitamin D deficiency    Wears glasses     PAST SURGICAL HISTORY: Past Surgical History:  Procedure Laterality Date   BACK SURGERY  2020   CATARACT EXTRACTION, BILATERAL Bilateral 2020   CONVERSION TO TOTAL HIP Right 01/22/2022   Procedure: CONVERSION TO TOTAL HIP;  Surgeon: Willaim Sheng, MD;  Location: [facility];  Service: Orthopedics;  Laterality: Right;   DILATATION & CURETTAGE/HYSTEROSCOPY WITH MYOSURE N/A 10/27/2014   Procedure: DILATATION & CURETTAGE/HYSTEROSCOPY WITH MYOSURE;  Surgeon: Darlyn Chamber, MD;  Location: [place];  Service: Gynecology;  Laterality: N/A;   DILATION AND CURETTAGE OF UTERUS     HIP SURGERY Right 04/2020   rod and screw in right hip    MOHS SURGERY  2005   LEFT NASAL BRIDGE FOR BASAL CELL   TONSILLECTOMY  age 58    FAMILY HISTORY: The patient family history includes Autism in her son; Autism spectrum disorder in her son; Cancer in her father, maternal grandmother, mother, and sister; Colitis in her son; Colon cancer (age of onset: 58) in her brother; Diabetes in her brother and maternal grandfather; Healthy in her son; Hyperlipidemia in her brother, father, maternal grandfather, maternal grandmother, mother, paternal grandfather, paternal grandmother, and sister; Hypertension in her brother and mother; Stroke in her maternal grandfather.  SOCIAL HISTORY:  The patient  reports that she quit smoking about 21 years ago. Her smoking use included cigarettes. She has a 3.75 pack-year smoking history. She has never been exposed to tobacco smoke. She has never used smokeless tobacco. She reports that she does not currently use alcohol. She reports that she does not use drugs.  REVIEW OF SYSTEMS: Review of Systems  Cardiovascular:  Negative for chest  pain, dyspnea on exertion, leg swelling, palpitations and syncope.  Respiratory:  Negative for shortness of breath.     PHYSICAL EXAM:    04/27/2022    9:31 AM 04/23/2022   10:29 AM 04/05/2022   11:28 AM  Vitals with BMI  Height 5' 5"     Weight 108 lbs 10 oz 109 lbs  114 lbs  BMI 16.10  96.04  Systolic 540 981 191  Diastolic 81 78 64  Pulse 64 65 75    CONSTITUTIONAL: Well-developed and well-nourished. No acute distress.  SKIN: Skin is warm and dry. No rash noted. No cyanosis. No pallor. No jaundice HEAD: Normocephalic and atraumatic.  EYES: No scleral icterus MOUTH/THROAT: Moist oral membranes.  NECK: No JVD present. No thyromegaly noted. No carotid bruits  CHEST Normal respiratory effort. No intercostal retractions  LUNGS: Clear to auscultation bilaterally.  No stridor. No wheezes. No rales.  CARDIOVASCULAR: Regular rate and rhythm, positive S1-S2, no murmurs rubs or gallops appreciated. ABDOMINAL: Obese, soft, nontender, nondistended, positive bowel sounds all 4 quadrants. No apparent ascites.  EXTREMITIES: No peripheral edema, warm to touch, +2 bilateral DP and PT pulses HEMATOLOGIC: No significant bruising NEUROLOGIC: Oriented to person, place, and time. Nonfocal. Normal muscle tone.  PSYCHIATRIC: Normal mood and affect. Normal behavior. Cooperative  CARDIAC DATABASE: EKG: 04/27/2022: NSR, 63 bpm, right bundle branch block, without underlying injury pattern.   Echocardiogram: 11/02/2021:  Normal LV systolic function with visual EF 60-65%. Left ventricle cavity is normal in size. Normal left ventricular wall thickness. Normal global wall motion. Normal diastolic filling pattern, normal LAP.  No significant valvular abnormalities.  No prior study for comparison.   Stress Testing: Lexiscan (with Mod Bruce protocol) Nuclear stress test 12/04/2020: Nondiagnostic ECG stress. The heart rate response was consistent with walking Lexiscan stress and achieved 84% of MPHR.  Myocardial  perfusion is normal. Overall LV systolic function is normal without regional wall motion abnormalities. Stress LV EF: 52%.  No previous exam available for comparison. Low risk.     Heart Catheterization: None  LABORATORY DATA:    Latest Ref Rng & Units 03/14/2022    3:32 PM 02/21/2022   10:24 AM 01/23/2022    3:19 AM  CBC  WBC 3.8 - 10.8 Thousand/uL 4.4  4.6  7.4   Hemoglobin 11.7 - 15.5 g/dL 10.5  10.5  9.9   Hematocrit 35.0 - 45.0 % 33.0  32.4  29.8   Platelets 140 - 400 Thousand/uL 272  273  219        Latest Ref Rng & Units 03/14/2022    3:32 PM 02/21/2022   10:24 AM 01/23/2022    3:19 AM  CMP  Glucose 65 - 99 mg/dL 104  87  114   BUN 7 - 25 mg/dL 13  11  13    Creatinine 0.60 - 0.95 mg/dL 0.83  0.77  0.73   Sodium 135 - 146 mmol/L 140  136  134   Potassium 3.5 - 5.3 mmol/L 4.0  4.5  3.3   Chloride 98 - 110 mmol/L 106  102  103   CO2 20 - 32 mmol/L 28  28  26    Calcium 8.6 - 10.4 mg/dL 9.3  9.7  8.2   Total Protein 6.1 - 8.1 g/dL 5.9  5.9    Total Bilirubin 0.2 - 1.2 mg/dL 0.4  0.4    AST 10 - 35 U/L 19  19    ALT 6 - 29 U/L 12  11      Lipid Panel     Component Value Date/Time   CHOL 163 11/21/2021 1021   TRIG 95 11/21/2021 1021   HDL 60 11/21/2021 1021   CHOLHDL 2.7 11/21/2021 1021   VLDL 13 05/03/2017 1207   LDLCALC 84 11/21/2021 1021    No components found for: "NTPROBNP" No results for input(s): "PROBNP"  in the last 8760 hours. Recent Labs    06/14/21 0922 08/18/21 0927 11/21/21 1021  TSH 1.42 5.97* 2.23    BMP Recent Labs    05/15/21 1513 06/14/21 0922 08/06/21 1258 08/18/21 0927 01/09/22 0952 01/23/22 0319 02/21/22 1024 03/14/22 1532  NA 134*   < > 130*   < > 135 134* 136 140  K 4.3   < > 3.8   < > 4.4 3.3* 4.5 4.0  CL 100   < > 92*   < > 103 103 102 106  CO2 27   < > 25   < > 26 26 28 28   GLUCOSE 135*   < > 100*   < > 81 114* 87 104*  BUN 13   < > 12   < > 16 13 11 13   CREATININE 0.76   < > 0.86   < > 0.87 0.73 0.77 0.83  CALCIUM 9.3    < > 10.6*   < > 8.8* 8.2* 9.7 9.3  GFRNONAA 75  --  >60  --  >60 >60  --   --   GFRAA 86  --   --   --   --   --   --   --    < > = values in this interval not displayed.    HEMOGLOBIN A1C Lab Results  Component Value Date   HGBA1C 5.5 11/21/2021   MPG 111 11/21/2021    IMPRESSION:    ICD-10-CM   1. Benign hypertension  I10 EKG 12-Lead    2. Mixed hyperlipidemia  E78.2     3. RBBB  I45.10     4. Encounter to discuss test results  Z71.2     5. Former smoker  Z87.891        RECOMMENDATIONS: AGGIE DOUSE is a 81 y.o. Caucasian female whose past medical history and cardiac risk factors include: hypertension, right bundle branch block, hyperlipidemia, GERD, neuropathy, osteoporosis, former smoker, post menopausal, advance age.   Patient presents today for reevaluation of precordial chest pain and review test results that were performed as part of her preoperative work-up.  Since last office visit she has not had any precordial discomfort.  Echocardiogram notes preserved LVEF with no significant valvular heart disease and nuclear stress test was reported to be overall low risk study.  Patient is still recovering from her recent surgery and therefore functional status has been limited.  Patient states that when she is fully recovered she plans to restart water aerobics.  We discussed long-term follow-up care as either once a year or as needed.  Patient states that she would feel more comfortable following up once a year given her age and comorbidities.  I have requested that she follows up after her annual well visit with PCP so labs are available for review and questions and concerns can be addressed.  She is more than welcome to come back sooner if change in clinical status.  FINAL MEDICATION LIST END OF ENCOUNTER: No orders of the defined types were placed in this encounter.   Medications Discontinued During This Encounter  Medication Reason   Polyethyl Glycol-Propyl Glycol  0.4-0.3 % SOLN    Vedolizumab (ENTYVIO IV) Change in therapy     Current Outpatient Medications:    acetaminophen (TYLENOL) 500 MG tablet, Take 500 mg by mouth at bedtime as needed for moderate pain or headache. , Disp: , Rfl:    Ascorbic Acid (VITAMIN C) 1000 MG tablet, Take  1,000 mg by mouth daily. , Disp: , Rfl:    aspirin EC 81 MG tablet, Take 1 tablet (81 mg total) by mouth in the morning and at bedtime., Disp: 60 tablet, Rfl: 0   bisoprolol (ZEBETA) 5 MG tablet, TAKE 1/2 TABLET BY MOUTH EVERY DAY IN THE EVENING FOR BLOOD PRESSURE, Disp: 45 tablet, Rfl: 3   Calcium Carbonate-Vit D-Min (CALCIUM 1200 PO), Take 1,200 mg by mouth daily., Disp: , Rfl:    cetirizine (ZYRTEC) 10 MG tablet, Take 10 mg by mouth at bedtime., Disp: , Rfl:    CINNAMON PO, Take 2,000 mg by mouth daily., Disp: , Rfl:    Coenzyme Q10 (COQ10) 200 MG CAPS, Take 200 mg by mouth daily., Disp: , Rfl:    colestipol (COLESTID) 1 g tablet, Take 4 g by mouth daily., Disp: , Rfl:    cycloSPORINE (RESTASIS) 0.05 % ophthalmic emulsion, Place 1 drop into both eyes 2 (two) times daily., Disp: , Rfl:    levothyroxine (SYNTHROID) 125 MCG tablet, TAKE 1 TABLET BY MOUTH DAILY ON AN EMPTY STOMACH WITH ONLY WATER FOR 30 MINUTES(NO ANTACIDS MEDICATIONS, CALCIUM OR MAGNESIUM FOR 4 HOURS) (Patient taking differently: Take 125 mcg by mouth every other day.), Disp: 90 tablet, Rfl: 0   loratadine (CLARITIN) 10 MG tablet, Take 10 mg by mouth daily as needed for allergies., Disp: , Rfl:    Multiple Vitamin (THERA VITAMIN PO), Take 1 tablet by mouth daily., Disp: , Rfl:    mupirocin ointment (BACTROBAN) 2 %, Apply 1 application. topically 2 (two) times daily., Disp: 22 g, Rfl: 0   Omega-3 Fatty Acids (FISH OIL) 1200 MG CAPS, Take 1,200 mg by mouth daily. , Disp: , Rfl:    pantoprazole (PROTONIX) 40 MG tablet, Take 1 tablet  2 x /day  for Heartburn & Indigestion, Disp: 180 tablet, Rfl: 0   Probiotic Product (PROBIOTIC PO), Take 1 capsule by mouth  daily., Disp: , Rfl:    PROLIA 60 MG/ML SOSY injection, Inject 60 mg into the skin every 6 (six) months., Disp: , Rfl:    pseudoephedrine (SUDAFED) 120 MG 12 hr tablet, Take  1 tablet  2 x /day (every 12 hours)  for Head and Chest Congestion, Disp: 60 tablet, Rfl: 0   simvastatin (ZOCOR) 20 MG tablet, Take  1 tablet  at Bedtime  for Cholesterol                                                 /                        TAKE             BY MOUTH, Disp: 90 tablet, Rfl: 3  Orders Placed This Encounter  Procedures   EKG 12-Lead    There are no Patient Instructions on file for this visit.   --Continue cardiac medications as reconciled in final medication list. --Return in about 1 year (around 04/28/2023) for Annual follow-up. Or sooner if needed. --Continue follow-up with your primary care physician regarding the management of your other chronic comorbid conditions.  Patient's questions and concerns were addressed to her satisfaction. She voices understanding of the instructions provided during this encounter.   This note was created using a voice recognition software as a result there may be grammatical errors  inadvertently enclosed that do not reflect the nature of this encounter. Every attempt is made to correct such errors.  Rex Kras, [place], [facility]  Pager: [phone] Office: [phone]

## 2022-05-07 DIAGNOSIS — E039 Hypothyroidism, unspecified: Secondary | ICD-10-CM | POA: Diagnosis not present

## 2022-05-07 DIAGNOSIS — Z79890 Hormone replacement therapy (postmenopausal): Secondary | ICD-10-CM | POA: Diagnosis not present

## 2022-05-08 ENCOUNTER — Ambulatory Visit: Admit: 2022-05-08 | Discharge: 2022-05-09 | Payer: MEDICARE | Attending: Internal Medicine | Primary: Internal Medicine

## 2022-05-08 DIAGNOSIS — K529 Noninfective gastroenteritis and colitis, unspecified: Principal | ICD-10-CM

## 2022-05-08 DIAGNOSIS — Z681 Body mass index (BMI) 19 or less, adult: Secondary | ICD-10-CM | POA: Diagnosis not present

## 2022-05-10 DIAGNOSIS — M47812 Spondylosis without myelopathy or radiculopathy, cervical region: Secondary | ICD-10-CM | POA: Diagnosis not present

## 2022-05-11 ENCOUNTER — Other Ambulatory Visit: Payer: Self-pay

## 2022-05-11 ENCOUNTER — Emergency Department (HOSPITAL_BASED_OUTPATIENT_CLINIC_OR_DEPARTMENT_OTHER): Payer: Medicare Other | Admitting: Radiology

## 2022-05-11 ENCOUNTER — Encounter (HOSPITAL_BASED_OUTPATIENT_CLINIC_OR_DEPARTMENT_OTHER): Payer: Self-pay

## 2022-05-11 ENCOUNTER — Emergency Department (HOSPITAL_BASED_OUTPATIENT_CLINIC_OR_DEPARTMENT_OTHER)
Admission: EM | Admit: 2022-05-11 | Discharge: 2022-05-11 | Disposition: A | Discharge status: Home / Self Care | Payer: Medicare Other | Attending: Emergency Medicine | Admitting: Emergency Medicine

## 2022-05-11 DIAGNOSIS — Z7982 Long term (current) use of aspirin: Secondary | ICD-10-CM | POA: Diagnosis not present

## 2022-05-11 DIAGNOSIS — R9431 Abnormal electrocardiogram [ECG] [EKG]: Secondary | ICD-10-CM | POA: Diagnosis not present

## 2022-05-11 DIAGNOSIS — M25512 Pain in left shoulder: Secondary | ICD-10-CM | POA: Diagnosis not present

## 2022-05-11 MED ORDER — MORPHINE SULFATE (PF) 4 MG/ML IV SOLN
4.0000 mg | Freq: Once | INTRAVENOUS | Status: AC
  Administered 2022-05-11: 4 mg via INTRAMUSCULAR
  Filled 2022-05-11: qty 1

## 2022-05-11 MED ORDER — HYDROCODONE-ACETAMINOPHEN 5-325 MG PO TABS
1.0000 | ORAL_TABLET | Freq: Once | ORAL | Status: AC
  Administered 2022-05-11: 1 via ORAL
  Filled 2022-05-11: qty 1

## 2022-05-11 MED ORDER — DEXAMETHASONE 4 MG PO TABS
6.0000 mg | ORAL_TABLET | Freq: Once | ORAL | Status: AC
  Administered 2022-05-11: 6 mg via ORAL
  Filled 2022-05-11: qty 2

## 2022-05-11 MED ORDER — OXYCODONE-ACETAMINOPHEN 5-325 MG PO TABS: 1.0000 | ORAL_TABLET | Freq: Three times a day (TID) | ORAL | 0 refills | Status: DC | PRN

## 2022-05-11 NOTE — ED Triage Notes (Signed)
Patient here POV from Home.  Endorses Left Shoulder and Left Back Pain. That began after Patient had "Cervical Neck Ablation" yesterday in the Afternoon. Worse Last PM but awoke this AM with Intense Pain to Same. No Fall or Injury to Same. Patient was bent over for Operation for approximately 30 Minutes.  Pain is Localized to Stated Area and Worsens with certain Movement.   No SOB. No N/V.   NAD Noted during Triage. A&Ox4. GCS 15. Ambulatory.

## 2022-05-13 ENCOUNTER — Emergency Department (HOSPITAL_COMMUNITY)
Admission: EM | Admit: 2022-05-13 | Discharge: 2022-05-14 | Disposition: A | Discharge status: Home / Self Care | Payer: Medicare Other | Attending: Emergency Medicine | Admitting: Emergency Medicine

## 2022-05-13 ENCOUNTER — Encounter (HOSPITAL_COMMUNITY): Payer: Self-pay | Admitting: Emergency Medicine

## 2022-05-13 ENCOUNTER — Other Ambulatory Visit: Payer: Self-pay

## 2022-05-13 ENCOUNTER — Emergency Department (HOSPITAL_COMMUNITY): Payer: Medicare Other

## 2022-05-13 DIAGNOSIS — G8918 Other acute postprocedural pain: Secondary | ICD-10-CM | POA: Diagnosis not present

## 2022-05-13 DIAGNOSIS — M2550 Pain in unspecified joint: Secondary | ICD-10-CM | POA: Diagnosis not present

## 2022-05-13 DIAGNOSIS — Z79899 Other long term (current) drug therapy: Secondary | ICD-10-CM | POA: Diagnosis not present

## 2022-05-13 DIAGNOSIS — E039 Hypothyroidism, unspecified: Secondary | ICD-10-CM | POA: Insufficient documentation

## 2022-05-13 DIAGNOSIS — R0789 Other chest pain: Secondary | ICD-10-CM | POA: Insufficient documentation

## 2022-05-13 DIAGNOSIS — R079 Chest pain, unspecified: Secondary | ICD-10-CM

## 2022-05-13 DIAGNOSIS — Z7982 Long term (current) use of aspirin: Secondary | ICD-10-CM | POA: Insufficient documentation

## 2022-05-13 DIAGNOSIS — R509 Fever, unspecified: Secondary | ICD-10-CM | POA: Insufficient documentation

## 2022-05-13 DIAGNOSIS — I10 Essential (primary) hypertension: Secondary | ICD-10-CM | POA: Insufficient documentation

## 2022-05-13 DIAGNOSIS — Z85828 Personal history of other malignant neoplasm of skin: Secondary | ICD-10-CM | POA: Diagnosis not present

## 2022-05-13 DIAGNOSIS — R7989 Other specified abnormal findings of blood chemistry: Secondary | ICD-10-CM | POA: Diagnosis not present

## 2022-05-13 LAB — CBC
HCT: 35 % — ABNORMAL LOW (ref 36.0–46.0)
Hemoglobin: 11.2 g/dL — ABNORMAL LOW (ref 12.0–15.0)
MCH: 29.3 pg (ref 26.0–34.0)
MCHC: 32 g/dL (ref 30.0–36.0)
MCV: 91.6 fL (ref 80.0–100.0)
Platelets: 146 10*3/uL — ABNORMAL LOW (ref 150–400)
RBC: 3.82 MIL/uL — ABNORMAL LOW (ref 3.87–5.11)
RDW: 16.4 % — ABNORMAL HIGH (ref 11.5–15.5)
WBC: 11 10*3/uL — ABNORMAL HIGH (ref 4.0–10.5)
nRBC: 0 % (ref 0.0–0.2)

## 2022-05-13 LAB — BASIC METABOLIC PANEL
Anion gap: 9 (ref 5–15)
BUN: 15 mg/dL (ref 8–23)
CO2: 23 mmol/L (ref 22–32)
Calcium: 9.4 mg/dL (ref 8.9–10.3)
Chloride: 101 mmol/L (ref 98–111)
Creatinine, Ser: 0.77 mg/dL (ref 0.44–1.00)
GFR, Estimated: >60 mL/min (ref >60)
Glucose, Bld: 113 mg/dL — ABNORMAL HIGH (ref 70–99)
Potassium: 4.1 mmol/L (ref 3.5–5.1)
Sodium: 133 mmol/L — ABNORMAL LOW (ref 135–145)

## 2022-05-13 LAB — TROPONIN I (HIGH SENSITIVITY): Troponin I (High Sensitivity): <2 ng/L (ref <18)

## 2022-05-13 LAB — D-DIMER, QUANTITATIVE: D-Dimer, Quant: 1.72 ug/mL-FEU — ABNORMAL HIGH (ref 0.00–0.50)

## 2022-05-13 MED ORDER — HYDROCODONE-ACETAMINOPHEN 5-325 MG PO TABS
1.0000 | ORAL_TABLET | Freq: Once | ORAL | Status: AC
  Administered 2022-05-13: 1 via ORAL
  Filled 2022-05-13: qty 1

## 2022-05-13 MED ORDER — SODIUM CHLORIDE 0.9 % IV BOLUS
500.0000 mL | Freq: Once | INTRAVENOUS | Status: AC
  Administered 2022-05-13: 500 mL via INTRAVENOUS

## 2022-05-13 MED ORDER — KETOROLAC TROMETHAMINE 15 MG/ML IJ SOLN
15.0000 mg | Freq: Once | INTRAMUSCULAR | Status: AC
  Administered 2022-05-13: 15 mg via INTRAVENOUS
  Filled 2022-05-13: qty 1

## 2022-05-13 MED ORDER — MORPHINE SULFATE (PF) 2 MG/ML IV SOLN
2.0000 mg | Freq: Once | INTRAVENOUS | Status: AC
  Administered 2022-05-13: 2 mg via INTRAVENOUS
  Filled 2022-05-13: qty 1

## 2022-05-13 MED ORDER — ONDANSETRON HCL 4 MG/2ML IJ SOLN
4.0000 mg | Freq: Once | INTRAMUSCULAR | Status: AC
  Administered 2022-05-13: 4 mg via INTRAVENOUS
  Filled 2022-05-13: qty 2

## 2022-05-13 NOTE — ED Triage Notes (Signed)
Patient arrives ambulatory by POV with spouse. Patient states she went to [facility] two days for ongoing left shoulder and left back pain. Patient states she had a cervical neck ablation. Reports fever today. Took percocet this am.

## 2022-05-14 ENCOUNTER — Emergency Department (HOSPITAL_COMMUNITY): Payer: Medicare Other

## 2022-05-14 ENCOUNTER — Encounter (HOSPITAL_COMMUNITY): Payer: Self-pay

## 2022-05-14 ENCOUNTER — Ambulatory Visit: Payer: Medicare Other | Admitting: Family Medicine

## 2022-05-14 DIAGNOSIS — R0789 Other chest pain: Secondary | ICD-10-CM | POA: Diagnosis not present

## 2022-05-14 DIAGNOSIS — R7989 Other specified abnormal findings of blood chemistry: Secondary | ICD-10-CM | POA: Diagnosis not present

## 2022-05-14 MED ORDER — IOHEXOL 350 MG/ML SOLN
75.0000 mL | Freq: Once | INTRAVENOUS | Status: AC | PRN
  Administered 2022-05-14: 75 mL via INTRAVENOUS

## 2022-05-14 MED ORDER — SODIUM CHLORIDE (PF) 0.9 % IJ SOLN
INTRAMUSCULAR | Status: AC
  Filled 2022-05-14: qty 50

## 2022-05-15 ENCOUNTER — Ambulatory Visit: Payer: Medicare Other | Admitting: Internal Medicine

## 2022-05-15 ENCOUNTER — Encounter: Payer: Self-pay | Admitting: Internal Medicine

## 2022-05-15 DIAGNOSIS — R197 Diarrhea, unspecified: Principal | ICD-10-CM

## 2022-05-15 DIAGNOSIS — K5100 Ulcerative (chronic) pancolitis without complications: Principal | ICD-10-CM

## 2022-05-15 NOTE — Progress Notes (Signed)
C  A  N  C  E  L  L  E  D    D A  Y        O F           A  P  P ' T                                                                                                                                                                                                                                    Annual Screening/Preventative Visit &  Comprehensive Evaluation &  Examination   Future Appointments  Date Time Provider Department  05/15/2022           CPE  3:00 PM Lucky Cowboy, MD [facility]  06/05/2022 10:30 AM Sundra Aland, RPH [facility]  08/21/2022          Wellness 10:00 AM Raynelle Dick, NP [facility]  09/14/2022 11:00 AM Pollyann Savoy, MD [facility]  04/29/2023           CPE 10:30 AM [name], DO [facility]        [name] presents for a Screening /Preventative Visit & comprehensive evaluation and management of multiple medical co-morbidities.  Patient has been followed for HTN, HLD, Pre-Diabetes, Osteoporosis,  Hypothyroidism and Vitamin D Deficiency.                  In Jan 2020 , patient had a L4-L5 decompression / Stabilization of Spondylolisthesis by Dr Debbie Houston for lumbar radiculopathy and neurogenic claudication.        On May 10, 2022, patient had a cervical ablation by Dr. Lorrine Houston. Then patient was seen in the ER on 6/23 and 6/25 for neg w/u of pain & referred back to her surgeon         HTN predates since 2013. Patient's BP has been controlled at home and patient denies any cardiac symptoms as chest pain, palpitations, shortness of breath, dizziness or ankle swelling. Today's          Patient's hyperlipidemia is controlled with diet and Simvastatin. Patient denies myalgias or other medication SE's. Last lipids were at  goal :  Lab Results  Component Value Date   CHOL 163 11/21/2021   HDL 60 11/21/2021   LDLCALC 84 11/21/2021   TRIG 95 11/21/2021   CHOLHDL 2.7  11/21/2021                                                     Patient has Hypothyroidism and has been on Thyroid Replacement since 1990.        Patient has hx/o  prediabetes (A1c 5.7% /2019) and patient denies reactive hypoglycemic symptoms, visual blurring, diabetic polys or paresthesias. Last A1c was normal & at goal :  Lab Results  Component Value Date   HGBA1C 5.5 11/21/2021         Finally, patient has history of Vitamin D Deficiency and last Vitamin D was at goal :  Lab Results  Component Value Date   VD25OH 64 11/21/2021     Current Outpatient Medications on File Prior to Visit  Medication Sig   acetaminophen (TYLENOL) 500 MG tablet Take 500 mg by mouth at bedtime as needed for moderate pain or headache.    Ascorbic Acid (VITAMIN C) 1000 MG tablet Take 1,000 mg by mouth daily.    aspirin EC 81 MG tablet Take 1 tablet (81 mg total) by mouth in the morning and at bedtime.   bisoprolol (ZEBETA) 5 MG tablet TAKE 1/2 TABLET BY MOUTH EVERY DAY IN THE EVENING FOR BLOOD PRESSURE   Calcium Carbonate-Vit D-Min (CALCIUM 1200 PO) Take 1,200 mg by mouth daily.   cetirizine (ZYRTEC) 10 MG tablet Take 10 mg by mouth at bedtime.   CINNAMON PO Take 2,000 mg by mouth daily.   Coenzyme Q10 (COQ10) 200 MG CAPS Take 200 mg by mouth daily.   colestipol (COLESTID) 1 g tablet Take 4 g by mouth daily.   cycloSPORINE (RESTASIS) 0.05 % ophthalmic emulsion Place 1 drop into both eyes 2 (two) times daily.   levothyroxine (SYNTHROID) 125 MCG tablet TAKE 1 TABLET BY MOUTH DAILY ON AN EMPTY STOMACH WITH ONLY WATER FOR 30 MINUTES(NO ANTACIDS MEDICATIONS, CALCIUM OR MAGNESIUM FOR 4 HOURS) (Patient taking differently: Take 125 mcg by mouth every other day.)   loratadine (CLARITIN) 10 MG tablet Take 10 mg by mouth daily as needed for allergies.   Multiple  Vitamin (THERA VITAMIN PO) Take 1 tablet by mouth daily.   mupirocin ointment (BACTROBAN) 2 % Apply 1 application. topically 2 (two) times daily.   Omega-3 Fatty Acids (FISH OIL) 1200 MG CAPS Take 1,200 mg by mouth daily.    oxyCODONE-acetaminophen (PERCOCET/ROXICET) 5-325 MG tablet Take 1 tablet by mouth every 8 (eight) hours as needed for up to 10 doses for severe pain.   pantoprazole (PROTONIX) 40 MG tablet Take 1 tablet  2 x /day  for Heartburn & Indigestion   Probiotic Product (PROBIOTIC PO) Take 1 capsule by mouth daily.   PROLIA 60 MG/ML SOSY injection Inject 60 mg into the skin every 6 (six) months.   pseudoephedrine (SUDAFED) 120 MG 12 hr tablet Take  1 tablet  2 x /day (every 12 hours)  for Head and Chest Congestion   simvastatin (ZOCOR) 20 MG tablet Take  1 tablet  at Bedtime  for Cholesterol                                                 /  TAKE             BY MOUTH   No current facility-administered medications on file prior to visit.     Allergies  Allergen Reactions   Fosamax [Alendronate Sodium] Other (See Comments)    GI upset    Singulair [Montelukast Sodium] Other (See Comments)    Makes pt jittery   Wellbutrin [Bupropion] Hives   Clindamycin/Lincomycin Rash   Sulfa Antibiotics Rash     Past Medical History:  Diagnosis Date   Arthritis    Benign labile hypertension    Cancer (HCC)    skin cancer on face   Colitis    DJD (degenerative joint disease)    Endometrial polyp    GERD (gastroesophageal reflux disease)    History of basal cell carcinoma excision    History of esophagitis    HOH (hard of hearing)    right ear better than left  -- wears no aides   Hyperlipidemia    Hypothyroidism    Neuromuscular disorder (HCC)    neuropathy in feet   Osteopenia    Prediabetes    S/P right hip fracture 08/17/2020   Vitamin D deficiency    Wears glasses      Health Maintenance  Topic Date Due   Pneumonia Vaccine 72+ Years old (2 -  PPSV23 if available, else PCV20) 05/04/2016   COVID-19 Vaccine (5 - Booster for Pfizer series) 06/09/2021   TETANUS/TDAP  08/21/2022 (Originally 08/21/2021)   INFLUENZA VACCINE  06/19/2022   MAMMOGRAM  01/11/2023   COLONOSCOPY (Pts 45-31yrs Insurance coverage will need to be confirmed)  10/16/2026   DEXA SCAN  Completed   Zoster Vaccines- Shingrix  Completed   HPV VACCINES  Aged Out     Immunization History  Administered Date(s) Administered   Influenza, High Dose Seasonal PF 07/23/2014, 08/23/2015, 07/10/2016, 08/27/2017, 08/22/2018, 08/25/2019, 08/30/2020, 08/21/2021   PFIZER(Purple Top)SARS-COV-2 Vaccination 12/10/2019, 12/31/2019, 08/16/2020, 04/14/2021   Pneumococcal Conjugate-13 05/05/2015   Pneumococcal-Unspecified 08/22/2011   Tdap 08/22/2011   Zoster Recombinat (Shingrix) 09/13/2017, 01/01/2018   Zoster, Live 06/20/2006     05/13/2019 Colonoscopy -  Dr Ewing Schlein / [facility] with a dx/o Collagenous Colitis.  Last Colon - 10/16/2021 - Dr Vidal Schwalbe, Biopsy of Nodular sigmoid mucosa - Path never received.  Last EGD - 10/17/2019 - Dr Marland Mcalpine -   Last MGM -    Past Surgical History:  Procedure Laterality Date   BACK SURGERY  2020   CATARACT EXTRACTION, BILATERAL Bilateral 2020   CONVERSION TO TOTAL HIP Right 01/22/2022   Procedure: CONVERSION TO TOTAL HIP;  Surgeon: Joen Laura, MD;  Location: [facility];  Service: Orthopedics;  Laterality: Right;   DILATATION & CURETTAGE/HYSTEROSCOPY WITH MYOSURE N/A 10/27/2014   Procedure: DILATATION & CURETTAGE/HYSTEROSCOPY WITH MYOSURE;  Surgeon: Juluis Mire, MD;  Location: [facility] [place];  Service: Gynecology;  Laterality: N/A;   DILATION AND CURETTAGE OF UTERUS     HIP SURGERY Right 04/2020   rod and screw in right hip    MOHS SURGERY  2005   LEFT NASAL BRIDGE FOR BASAL CELL   TONSILLECTOMY  age 60     Family History  Problem Relation Age of Onset   Hypertension Mother    Cancer Mother        COLON    Hyperlipidemia Mother    Cancer Father        BREAST WITH BRAIN METS   Hyperlipidemia Father    Cancer Sister  COLON (TEENS), BREAST (50)   Hyperlipidemia Sister    Diabetes Brother    Hyperlipidemia Brother    Hypertension Brother    Colon cancer Brother 70   Cancer Maternal Grandmother        RENAL   Hyperlipidemia Maternal Grandmother    Diabetes Maternal Grandfather    Stroke Maternal Grandfather    Hyperlipidemia Maternal Grandfather    Hyperlipidemia Paternal Grandmother    Hyperlipidemia Paternal Grandfather    Autism Son    Colitis Son    Autism spectrum disorder Son    Healthy Son      Social History   Tobacco Use   Smoking status: Former    Packs/day: 0.25    Years: 15.00    Total pack years: 3.75    Types: Cigarettes    Quit date: 2002    Years since quitting: 21.4    Passive exposure: Never   Smokeless tobacco: Never   Tobacco comments:    Former light smoker, quit 15+ years ago, can't recall  Vaping Use   Vaping Use: Never used  Substance Use Topics   Alcohol use: Not Currently   Drug use: No      ROS Constitutional: Denies fever, chills, weight loss/gain, headaches, insomnia,  night sweats, and change in appetite. Does c/o fatigue. Eyes: Denies redness, blurred vision, diplopia, discharge, itchy, watery eyes.  ENT: Denies discharge, congestion, post nasal drip, epistaxis, sore throat, earache, hearing loss, dental pain, Tinnitus, Vertigo, Sinus pain, snoring.  Cardio: Denies chest pain, palpitations, irregular heartbeat, syncope, dyspnea, diaphoresis, orthopnea, PND, claudication, edema Respiratory: denies cough, dyspnea, DOE, pleurisy, hoarseness, laryngitis, wheezing.  Gastrointestinal: Denies dysphagia, heartburn, reflux, water brash, pain, cramps, nausea, vomiting, bloating, diarrhea, constipation, hematemesis, melena, hematochezia, jaundice, hemorrhoids Genitourinary: Denies dysuria, frequency, urgency, nocturia, hesitancy, discharge,  hematuria, flank pain Breast: Breast lumps, nipple discharge, bleeding.  Musculoskeletal: Denies arthralgia, myalgia, stiffness, Jt. Swelling, pain, limp, and strain/sprain. Denies falls. Skin: Denies puritis, rash, hives, warts, acne, eczema, changing in skin lesion Neuro: No weakness, tremor, incoordination, spasms, paresthesia, pain Psychiatric: Denies confusion, memory loss, sensory loss. Denies Depression. Endocrine: Denies change in weight, skin, hair change, nocturia, and paresthesia, diabetic polys, visual blurring, hyper / hypo glycemic episodes.  Heme/Lymph: No excessive bleeding, bruising, enlarged lymph nodes.  Physical Exam  There were no vitals taken for this visit.  General Appearance: Well nourished, well groomed and in no apparent distress.  Eyes: PERRLA, EOMs, conjunctiva no swelling or erythema, normal fundi and vessels. Sinuses: No frontal/maxillary tenderness ENT/Mouth: EACs patent / TMs  nl. Nares clear without erythema, swelling, mucoid exudates. Oral hygiene is good. No erythema, swelling, or exudate. Tongue normal, non-obstructing. Tonsils not swollen or erythematous. Hearing normal.  Neck: Supple, thyroid not palpable. No bruits, nodes or JVD. Respiratory: Respiratory effort normal.  BS equal and clear bilateral without rales, rhonci, wheezing or stridor. Cardio: Heart sounds are normal with regular rate and rhythm and no murmurs, rubs or gallops. Peripheral pulses are normal and equal bilaterally without edema. No aortic or femoral bruits. Chest: symmetric with normal excursions and percussion. Breasts: Symmetric, without lumps, nipple discharge, retractions, or fibrocystic changes.  Abdomen: Flat, soft with bowel sounds active. Nontender, no guarding, rebound, hernias, masses, or organomegaly.  Lymphatics: Non tender without lymphadenopathy.  Genitourinary:  Musculoskeletal: Full ROM all peripheral extremities, joint stability, 5/5 strength, and normal  gait. Skin: Warm and dry without rashes, lesions, cyanosis, clubbing or  ecchymosis.  Neuro: Cranial nerves intact, reflexes equal bilaterally. Normal muscle  tone, no cerebellar symptoms. Sensation intact.  Pysch: Alert and oriented X 3, normal affect, Insight and Judgment appropriate.    Assessment and Plan  1. Annual Preventative Screening Examination  1. Essential hypertension  - EKG 12-Lead - Urinalysis, Routine w reflex microscopic - Microalbumin / creatinine urine ratio - CBC with Differential/Platelet - COMPLETE METABOLIC PANEL WITH GFR - Magnesium - TSH  2. Hyperlipidemia, mixed  - EKG 12-Lead - Lipid panel - TSH  3. Abnormal glucose  - EKG 12-Lead - Hemoglobin A1c - Insulin, random  4. Vitamin D deficiency  - VITAMIN D 25 Hydroxy   5. Hypothyroidism, unspecified type  - TSH  6. Gastroesophageal reflux disease   - CBC with Differential/Platelet  7. Complete right bundle branch block (RBBB)   8. Age-related osteoporosis with current pathological fracture  - COMPLETE METABOLIC PANEL WITH GFR  9. Screening for colorectal cancer   10. Screening for heart disease  - EKG 12-Lead  11. FHx: heart disease  - EKG 12-Lead  12. Medication management  - Urinalysis, Routine w reflex microscopic - Microalbumin / creatinine urine ratio - CBC with Differential/Platelet - COMPLETE METABOLIC PANEL WITH GFR - Magnesium - Lipid panel - TSH - Hemoglobin A1c - Insulin, random - VITAMIN D 25 Hydroxy           Patient was counseled in prudent diet to achieve/maintain BMI less than 25 for weight control, BP monitoring, regular exercise and medications. Discussed med's effects and SE's. Screening labs and tests as requested with regular follow-up as recommended. Over 40 minutes of exam, counseling, chart review and high complex critical decision making was performed.   Marinus Maw, MD

## 2022-05-16 ENCOUNTER — Ambulatory Visit (INDEPENDENT_AMBULATORY_CARE_PROVIDER_SITE_OTHER): Payer: Medicare Other | Admitting: Adult Health

## 2022-05-16 ENCOUNTER — Telehealth: Payer: Self-pay

## 2022-05-16 ENCOUNTER — Encounter: Payer: Self-pay | Admitting: Adult Health

## 2022-05-16 VITALS — BP 104/66 | HR 93 | Temp 97.5°F | Wt 109.0 lb

## 2022-05-16 DIAGNOSIS — G4486 Cervicogenic headache: Secondary | ICD-10-CM | POA: Diagnosis not present

## 2022-05-16 DIAGNOSIS — M47812 Spondylosis without myelopathy or radiculopathy, cervical region: Secondary | ICD-10-CM | POA: Diagnosis not present

## 2022-05-16 DIAGNOSIS — K649 Unspecified hemorrhoids: Secondary | ICD-10-CM

## 2022-05-16 DIAGNOSIS — K6289 Other specified diseases of anus and rectum: Secondary | ICD-10-CM

## 2022-05-16 MED ORDER — HYDROCORTISONE ACETATE 25 MG RE SUPP: 25.0000 mg | Freq: Two times a day (BID) | RECTAL | 1 refills | Status: DC | PRN

## 2022-05-16 NOTE — Patient Instructions (Signed)

## 2022-05-16 NOTE — Progress Notes (Signed)
Assessment and Plan:  Katoria was seen today for gi problem.  Diagnoses and all orders for this visit:  Rectal pain Hemorrhoids, unspecified hemorrhoid type Likely due to recent constipation episode, has resolved No gross bleeding Has GI follow up for IBD/infusions next week Bowel management reviewed, soften stools as needed to avoid straining Sitz baths, biget/peri bottle to avoid excess irritation from paper Will give steroid suppositories short term, switch back to prep H once improved if needed Follow up GI if recurrent bowel concerns, frank bleeding that doesn't improve quickly  -     hydrocortisone (ANUSOL-HC) 25 MG suppository; Place 1 suppository (25 mg total) rectally 2 (two) times daily as needed for hemorrhoids or anal itching.  Discussed med's effects and SE's.   Over 15 minutes of exam, counseling, chart review, and critical decision making was performed.   Future Appointments  Date Time Provider [place]  06/05/2022 10:30 AM Carleene Mains, RPH [facility] None  06/18/2022  2:00 PM Unk Pinto, MD [facility] None  08/21/2022 10:00 AM Alycia Rossetti, NP [facility] None  09/14/2022 11:00 AM Bo Merino, MD [facility] None  04/29/2023 10:30 AM Terri Skains, Sun[place], DO [facility] None    ------------------------------------------------------------------------------------------------------------------   HPI BP 104/66   Pulse 93   Temp (!) 97.5 F (36.4 C)   Wt 109 lb (49.4 kg)   SpO2 97%   BMI 18.14 kg/m  80 y.o.female with hx of UC and GERD presents for evaluation of rectal pain.   IBD followed by [facility], has been doing well with remicade infusions, was due today but was postponed until next week. She reports recently had neck/shoulder pain, dx tendonitis, was prescribed narcotics and became very constipated, was able to pass but has had rectal tenderness, had brief rectal bleeding which has resolved, prep H helped some but still very tender to sit.   Past  Medical History:  Diagnosis Date   Arthritis    Benign labile hypertension    Cancer (HCC)    skin cancer on face   Colitis    DJD (degenerative joint disease)    Endometrial polyp    GERD (gastroesophageal reflux disease)    History of basal cell carcinoma excision    History of esophagitis    HOH (hard of hearing)    right ear better than left  -- wears no aides   Hyperlipidemia    Hypothyroidism    Neuromuscular disorder ([place])    neuropathy in feet   Osteopenia    Prediabetes    S/P right hip fracture 08/17/2020   Vitamin D deficiency    Wears glasses      Allergies  Allergen Reactions   Fosamax [Alendronate Sodium] Other (See Comments)    GI upset    Singulair [Montelukast Sodium] Other (See Comments)    Makes pt jittery   Wellbutrin [Bupropion] Hives   Clindamycin/Lincomycin Rash   Sulfa Antibiotics Rash    Current Outpatient Medications on File Prior to Visit  Medication Sig   acetaminophen (TYLENOL) 500 MG tablet Take 500 mg by mouth at bedtime as needed for moderate pain or headache.    Ascorbic Acid (VITAMIN C) 1000 MG tablet Take 1,000 mg by mouth daily.    aspirin EC 81 MG tablet Take 1 tablet (81 mg total) by mouth in the morning and at bedtime.   bisoprolol (ZEBETA) 5 MG tablet TAKE 1/2 TABLET BY MOUTH EVERY DAY IN THE EVENING FOR BLOOD PRESSURE   Calcium Carbonate-Vit D-Min (CALCIUM 1200  PO) Take 1,200 mg by mouth daily.   cetirizine (ZYRTEC) 10 MG tablet Take 10 mg by mouth at bedtime.   CINNAMON PO Take 2,000 mg by mouth daily.   Coenzyme Q10 (COQ10) 200 MG CAPS Take 200 mg by mouth daily.   colestipol (COLESTID) 1 g tablet Take 4 g by mouth daily.   cycloSPORINE (RESTASIS) 0.05 % ophthalmic emulsion Place 1 drop into both eyes 2 (two) times daily.   levothyroxine (SYNTHROID) 125 MCG tablet TAKE 1 TABLET BY MOUTH DAILY ON AN EMPTY STOMACH WITH ONLY WATER FOR 30 MINUTES(NO ANTACIDS MEDICATIONS, CALCIUM OR MAGNESIUM FOR 4 HOURS) (Patient taking  differently: Take 125 mcg by mouth every other day.)   loratadine (CLARITIN) 10 MG tablet Take 10 mg by mouth daily as needed for allergies.   Multiple Vitamin (THERA VITAMIN PO) Take 1 tablet by mouth daily.   mupirocin ointment (BACTROBAN) 2 % Apply 1 application. topically 2 (two) times daily.   Omega-3 Fatty Acids (FISH OIL) 1200 MG CAPS Take 1,200 mg by mouth daily.    pantoprazole (PROTONIX) 40 MG tablet Take 1 tablet  2 x /day  for Heartburn & Indigestion   Probiotic Product (PROBIOTIC PO) Take 1 capsule by mouth daily.   PROLIA 60 MG/ML SOSY injection Inject 60 mg into the skin every 6 (six) months.   simvastatin (ZOCOR) 20 MG tablet Take  1 tablet  at Bedtime  for Cholesterol                                                 /                        TAKE             BY MOUTH   oxyCODONE-acetaminophen (PERCOCET/ROXICET) 5-325 MG tablet Take 1 tablet by mouth every 8 (eight) hours as needed for up to 10 doses for severe pain. (Patient not taking: Reported on 05/16/2022)   pseudoephedrine (SUDAFED) 120 MG 12 hr tablet Take  1 tablet  2 x /day (every 12 hours)  for Head and Chest Congestion (Patient not taking: Reported on 05/16/2022)   No current facility-administered medications on file prior to visit.    ROS: all negative except above.   Physical Exam:  BP 104/66   Pulse 93   Temp (!) 97.5 F (36.4 C)   Wt 109 lb (49.4 kg)   SpO2 97%   BMI 18.14 kg/m   General Appearance: Well nourished, in no apparent distress. Eyes: conjunctiva no swelling or erythema ENT/Mouth: mask in place; Hearing normal.  Neck: Supple, thyroid normal.  Respiratory: Respiratory effort normal, Cardio: Appears well perfused Abdomen: Soft, + BS.  Non tender, no guarding, rebound, hernias, masses. Lymphatics: Non tender without lymphadenopathy.  Musculoskeletal: no obvious deformity; normal gait.  Skin: Warm, dry without rashes, lesions, ecchymosis.  Neuro: Normal muscle tone Psych: Awake and oriented X  3, normal affect, Insight and Judgment appropriate.  Rectal exam: tenderness noted (generalized), external hemorrhoids noted (not clearly inflamed), sphincter tone normal, no palpable mass.  Izora Ribas, NP 4:39 PM [facility]

## 2022-05-16 NOTE — Telephone Encounter (Signed)
LM-05/16/22-Called pt. And completed ED discharge protocol. Pt. Stated she is feeling better, but had trouble with taking Oyxcodone that was prescribed in ED. Pt. Noted she experienced constipation and was impacted. Pt. Denied nausea or vomiting. Offered assistance in scheduling f/u visit w/ Dr. Melford Aase and ortho, but pt. Declined. Pt. Agreed to call me with appointment dates when she schedules visits today. Will contact pt. If she does not return call.  Total time spent: 25 min.  LM-05/16/22-Pt. returned call and scheduled visit on July 6th W/ Dr. Hal Morales (Ortho) and appointment W/ Liane Comber, NP today at 2:30PM.  Total time spent: 3 min.

## 2022-05-18 DIAGNOSIS — K2100 Gastro-esophageal reflux disease with esophagitis, without bleeding: Secondary | ICD-10-CM | POA: Diagnosis not present

## 2022-05-18 DIAGNOSIS — E782 Mixed hyperlipidemia: Secondary | ICD-10-CM | POA: Diagnosis not present

## 2022-05-18 DIAGNOSIS — E039 Hypothyroidism, unspecified: Secondary | ICD-10-CM | POA: Diagnosis not present

## 2022-05-18 DIAGNOSIS — I10 Essential (primary) hypertension: Secondary | ICD-10-CM | POA: Diagnosis not present

## 2022-05-24 DIAGNOSIS — M810 Age-related osteoporosis without current pathological fracture: Secondary | ICD-10-CM | POA: Diagnosis not present

## 2022-05-24 DIAGNOSIS — M542 Cervicalgia: Secondary | ICD-10-CM | POA: Diagnosis not present

## 2022-05-25 DIAGNOSIS — K51919 Ulcerative colitis, unspecified with unspecified complications: Secondary | ICD-10-CM | POA: Diagnosis not present

## 2022-05-28 ENCOUNTER — Encounter: Payer: Self-pay | Admitting: Nurse Practitioner

## 2022-05-28 ENCOUNTER — Ambulatory Visit (INDEPENDENT_AMBULATORY_CARE_PROVIDER_SITE_OTHER): Payer: Medicare Other | Admitting: Nurse Practitioner

## 2022-05-28 ENCOUNTER — Ambulatory Visit: Payer: Medicare Other

## 2022-05-28 VITALS — BP 110/62 | HR 84 | Temp 97.7°F | Wt 108.0 lb

## 2022-05-28 DIAGNOSIS — L539 Erythematous condition, unspecified: Secondary | ICD-10-CM | POA: Diagnosis not present

## 2022-05-28 DIAGNOSIS — R600 Localized edema: Secondary | ICD-10-CM

## 2022-05-28 DIAGNOSIS — L03818 Cellulitis of other sites: Secondary | ICD-10-CM | POA: Diagnosis not present

## 2022-05-28 NOTE — Patient Instructions (Signed)

## 2022-05-28 NOTE — Progress Notes (Signed)
Assessment and Plan:  Debbie Houston was seen today for an episodic visit.  Diagnoses and all order for this visit:  1. Cellulitis of other specified site Area outlined with permanent marker. Continue to watch for spreading of redness outside the marked line, warmth, pain. Contact office if noticed. Area seems to be improving according to pt-will continue to monitor.   2. Erythema   3. Localized edema   Notify office for further evaluation and treatment, questions or concerns if s/s fail to improve. The risks and benefits of my recommendations, as well as other treatment options were discussed with the patient today. Questions were answered.  Further disposition pending results of labs. Discussed med's effects and SE's.    Over 20 minutes of exam, counseling, chart review, and critical decision making was performed.   Future Appointments  Date Time Provider [place]  05/31/2022 10:15 AM [facility] NURSE [facility]  06/05/2022 10:30 AM Carleene Mains, RPH [facility] None  06/18/2022  2:00 PM Unk Pinto, MD [facility] None  08/21/2022 10:00 AM Alycia Rossetti, NP [facility] None  09/14/2022 11:00 AM Bo Merino, MD [facility] None  04/29/2023 10:30 AM Rex Kras, DO [facility] None    ------------------------------------------------------------------------------------------------------------------   HPI BP 110/62   Pulse 84   Temp 97.7 F (36.5 C)   Wt 108 lb (49 kg)   SpO2 94%   BMI 17.97 kg/m   Subjective:    Debbie Houston is a 81 y.o. female who presents for evaluation of a possible skin infection located right posterior calf.  Occurred x1 week ago. Symptoms include erythema located posterior calf . Patient denies no pain, chills, fever greater than 100, and N/V . Precipitating event: insect bite. Treatment to date has included none.  She has noticed the areas has been decreasing in size of redness.     Past Medical History:  Diagnosis Date    Arthritis    Benign labile hypertension    Cancer (HCC)    skin cancer on face   Colitis    DJD (degenerative joint disease)    Endometrial polyp    GERD (gastroesophageal reflux disease)    History of basal cell carcinoma excision    History of esophagitis    HOH (hard of hearing)    right ear better than left  -- wears no aides   Hyperlipidemia    Hypothyroidism    Neuromuscular disorder ([place])    neuropathy in feet   Osteopenia    Prediabetes    S/P right hip fracture 08/17/2020   Vitamin D deficiency    Wears glasses      Allergies  Allergen Reactions   Fosamax [Alendronate Sodium] Other (See Comments)    GI upset    Singulair [Montelukast Sodium] Other (See Comments)    Makes pt jittery   Wellbutrin [Bupropion] Hives   Clindamycin/Lincomycin Rash   Sulfa Antibiotics Rash    Current Outpatient Medications on File Prior to Visit  Medication Sig   acetaminophen (TYLENOL) 500 MG tablet Take 500 mg by mouth at bedtime as needed for moderate pain or headache.    Ascorbic Acid (VITAMIN C) 1000 MG tablet Take 1,000 mg by mouth daily.    aspirin EC 81 MG tablet Take 1 tablet (81 mg total) by mouth in the morning and at bedtime.   bisoprolol (ZEBETA) 5 MG tablet TAKE 1/2 TABLET BY MOUTH EVERY DAY IN THE EVENING FOR BLOOD PRESSURE   Calcium Carbonate-Vit D-Min (CALCIUM 1200 PO)  Take 1,200 mg by mouth daily.   cetirizine (ZYRTEC) 10 MG tablet Take 10 mg by mouth at bedtime.   CINNAMON PO Take 2,000 mg by mouth daily.   Coenzyme Q10 (COQ10) 200 MG CAPS Take 200 mg by mouth daily.   colestipol (COLESTID) 1 g tablet Take 4 g by mouth daily.   cycloSPORINE (RESTASIS) 0.05 % ophthalmic emulsion Place 1 drop into both eyes 2 (two) times daily.   levothyroxine (SYNTHROID) 125 MCG tablet TAKE 1 TABLET BY MOUTH DAILY ON AN EMPTY STOMACH WITH ONLY WATER FOR 30 MINUTES(NO ANTACIDS MEDICATIONS, CALCIUM OR MAGNESIUM FOR 4 HOURS) (Patient taking differently: Take 125 mcg by mouth every other  day.)   loratadine (CLARITIN) 10 MG tablet Take 10 mg by mouth daily as needed for allergies.   Multiple Vitamin (THERA VITAMIN PO) Take 1 tablet by mouth daily.   mupirocin ointment (BACTROBAN) 2 % Apply 1 application. topically 2 (two) times daily.   Omega-3 Fatty Acids (FISH OIL) 1200 MG CAPS Take 1,200 mg by mouth daily.    pantoprazole (PROTONIX) 40 MG tablet Take 1 tablet  2 x /day  for Heartburn & Indigestion   Probiotic Product (PROBIOTIC PO) Take 1 capsule by mouth daily.   PROLIA 60 MG/ML SOSY injection Inject 60 mg into the skin every 6 (six) months.   pseudoephedrine (SUDAFED) 120 MG 12 hr tablet Take  1 tablet  2 x /day (every 12 hours)  for Head and Chest Congestion   simvastatin (ZOCOR) 20 MG tablet Take  1 tablet  at Bedtime  for Cholesterol                                                 /                        TAKE             BY MOUTH   hydrocortisone (ANUSOL-HC) 25 MG suppository Place 1 suppository (25 mg total) rectally 2 (two) times daily as needed for hemorrhoids or anal itching.   No current facility-administered medications on file prior to visit.    ROS: all negative except what is noted in the HPI.   Physical Exam:  BP 110/62   Pulse 84   Temp 97.7 F (36.5 C)   Wt 108 lb (49 kg)   SpO2 94%   BMI 17.97 kg/m   General Appearance: NAD.  Awake, conversant and cooperative. Eyes: PERRLA, EOMs intact.  Sclera white.  Conjunctiva without erythema. Sinuses: No frontal/maxillary tenderness.  No nasal discharge. Nares patent.  ENT/Mouth: Ext aud canals clear.  Bilateral TMs w/DOL and without erythema or bulging. Hearing intact.  Posterior pharynx without swelling or exudate.  Tonsils without swelling or erythema.  Neck: Supple.  No masses, nodules or thyromegaly. Respiratory: Effort is regular with non-labored breathing. Breath sounds are equal bilaterally without rales, rhonchi, wheezing or stridor.  Cardio: RRR with no MRGs. Brisk peripheral pulses without  edema.  Abdomen: Active BS in all four quadrants.  Soft and non-tender without guarding, rebound tenderness, hernias or masses. Lymphatics: Non tender without lymphadenopathy.  Musculoskeletal: Full ROM, 5/5 strength, normal ambulation.  No clubbing or cyanosis. Skin: Right posterior calf with approximately 5 mm round flat area of erythema, mild warmth, mild edema. Center area with healing 1  mm round abrasion. Non-tender to touch.  Surrounding skin WNL and intact. Neuro: CN II-XII grossly normal. Normal muscle tone without cerebellar symptoms and intact sensation.   Psych: AO X 3,  appropriate mood and affect, insight and judgment.     Darrol Jump, NP 3:18 PM [facility]

## 2022-05-31 ENCOUNTER — Ambulatory Visit: Payer: Medicare Other

## 2022-05-31 ENCOUNTER — Encounter: Payer: Self-pay | Admitting: Nurse Practitioner

## 2022-05-31 ENCOUNTER — Telehealth: Payer: Self-pay

## 2022-05-31 MED ORDER — DOXYCYCLINE HYCLATE 100 MG PO CAPS: ORAL_CAPSULE | ORAL | 0 refills | Status: DC

## 2022-05-31 NOTE — Telephone Encounter (Signed)
LM-05/31/22-Called pt. And confirmed CP focus outreach for 7/18. Pt. Aware time for appointment will be between 3-5pm. Connected pt. Directly to [facility] d/t right leg (cellulitis) pain. Pt. Noted she was seen by Darrol Jump, NP a few days ago and was told to call and get antibiotics if it didn't go away in a few days.    Total time spent: 15 min.

## 2022-06-05 ENCOUNTER — Ambulatory Visit: Payer: Medicare Other | Admitting: Pharmacy Technician

## 2022-06-05 DIAGNOSIS — Z79899 Other long term (current) drug therapy: Secondary | ICD-10-CM

## 2022-06-05 NOTE — Progress Notes (Signed)
Reached patient today for Outreach call. Patient has no complaints other than increased diarrhea on antibiotics being managed with Immodium. Has 1 dose left. States cellulitis has now changed to a light pink color. No fever, no weeping, still some swelling, no blisters. She states the pain in her neck/shoulder has resolved. She was counseled to call care team if there are any changes from baseline. No copay or medication barriers.   Marda Stalker, PharmD Clinical Pharmacist [email or web address] [phone]  CPP Prep: 40mn CPP Televisit: 143m

## 2022-06-06 NOTE — Telephone Encounter (Signed)
Left message for patient to call back  

## 2022-06-07 DIAGNOSIS — R197 Diarrhea, unspecified: Secondary | ICD-10-CM | POA: Diagnosis not present

## 2022-06-07 DIAGNOSIS — K5100 Ulcerative (chronic) pancolitis without complications: Secondary | ICD-10-CM | POA: Diagnosis not present

## 2022-06-08 DIAGNOSIS — H43393 Other vitreous opacities, bilateral: Secondary | ICD-10-CM | POA: Diagnosis not present

## 2022-06-08 DIAGNOSIS — H43813 Vitreous degeneration, bilateral: Secondary | ICD-10-CM | POA: Diagnosis not present

## 2022-06-08 DIAGNOSIS — H43391 Other vitreous opacities, right eye: Secondary | ICD-10-CM | POA: Diagnosis not present

## 2022-06-11 NOTE — Telephone Encounter (Signed)
Addendum 05/25/22-Chart Prep started, Reviewing OV, Consults, Hospital visits, Labs and medication changes.  Chart prep completed.  Total time spent: 58 min

## 2022-06-14 DIAGNOSIS — M47812 Spondylosis without myelopathy or radiculopathy, cervical region: Secondary | ICD-10-CM | POA: Diagnosis not present

## 2022-06-14 DIAGNOSIS — Z681 Body mass index (BMI) 19 or less, adult: Secondary | ICD-10-CM | POA: Diagnosis not present

## 2022-06-14 NOTE — Telephone Encounter (Signed)
Patient has nurse visit on 06/19/22 @ 9:30.

## 2022-06-15 DIAGNOSIS — R197 Diarrhea, unspecified: Principal | ICD-10-CM

## 2022-06-15 DIAGNOSIS — K5100 Ulcerative (chronic) pancolitis without complications: Principal | ICD-10-CM

## 2022-06-17 ENCOUNTER — Encounter: Payer: Self-pay | Admitting: Internal Medicine

## 2022-06-17 NOTE — Patient Instructions (Signed)

## 2022-06-17 NOTE — Progress Notes (Unsigned)
Annual Screening/Preventative Visit &  Comprehensive Evaluation &  Examination   Future Appointments  Date Time Provider Department  06/18/2022                     cpe  2:00 PM Unk Pinto, MD [facility]  08/21/2022                    wellness 10:00 AM Alycia Rossetti, NP [facility]  09/14/2022 11:00 AM Bo Merino, MD [facility]  04/29/2023 10:30 AM Rex Kras, DO [facility]  06/24/2023                      cpe  2:00 PM Unk Pinto, MD [facility]          [name] presents for a Screening /Preventative Visit & comprehensive evaluation and management of multiple medical co-morbidities.  Patient has been followed for HTN, HLD, Pre-Diabetes, Osteoporosis,  Hypothyroidism and Vitamin D Deficiency.                     In Jan 2020 , patient had a L4-L5 decompression / Stabilization of Spondylolisthesis by Dr Ellene Route for lumbar radiculopathy and neurogenic claudication.        On May 10, 2022, patient had a cervical ablation by Dr. Davy Pique. Then patient was seen in the ER on 6/23 and 6/25 for neg w/u of pain & referred back to her surgeon         HTN predates since 2013. Patient's BP has been controlled at home and patient denies any cardiac symptoms as chest pain, palpitations, shortness of breath, dizziness or ankle swelling. Today's          Patient's hyperlipidemia is controlled with diet and Simvastatin. Patient denies myalgias or other medication SE's. Last lipids were at goal :  Lab Results  Component Value Date   CHOL 163 11/21/2021   HDL 60 11/21/2021   LDL CALC 84 11/21/2021   TRIG 95 11/21/2021   CHOLHDL 2.7 11/21/2021                                                     Patient has Hypothyroidism and has been on Thyroid Replacement since 1990.        Patient has hx/o  prediabetes (A1c 5.7% /2019) and patient denies reactive hypoglycemic symptoms, visual blurring, diabetic polys or paresthesias. Last A1c was normal & at goal :  Lab  Results  Component Value Date   HGBA1C 5.5 11/21/2021         Finally, patient has history of Vitamin D Deficiency and last Vitamin D was at goal :  Lab Results  Component Value Date   VD25OH 64 11/21/2021     Current Outpatient Medications on File Prior to Visit  Medication Sig   acetaminophen (TYLENOL) 500 MG tablet Take at bedtime as needed for moderate pain or headache.    Ascorbic Acid (VITAMIN C) 1000 MG tablet Take daily.    aspirin EC 81 MG tablet Take 1 tablet  in the morning and at bedtime.   bisoprolol (ZEBETA) 5 MG tablet TAKE 1/2 TABLET EVERY DAY   Calcium Carbonate-Vit D-Min 1200  Take  daily.   cetirizine 10 MG tablet Take  at bedtime.  CINNAMON 2,000 mg  Take daily.   Coenzyme Q10  Take 200 mg  daily.   colestipo1  tablet Take 4 g daily.   RESTASIS 0.05 % ophth emulsion Place 1 drop into both eyes 2  times daily.      ANUSOL-HC  25 MG suppository Place 1 suppository  rectally 2 times daily as needed   levothyroxine  125 MCG tablet  Take 125 mcg every other day   loratadine ( 10 MG tablet Take daily as needed    Multiple Vitamin  Take 1 tablet daily.   mupirocin ointment (BACTROBAN) 2 % Apply 1 application. topically 2  times daily.   Omega-3 FISH OIL 1200 MG CAPS Take  daily.    pantoprazole 40 MG tablet Take 1 tablet  2 x /day    Probiotic  Take 1 capsule daily.   PROLIA 60 MG/ML injec Inject 60 mg into the skin every 6 months.   pseudoephedrine 120 MG 12 hr tablet Take  1 tablet  2 x /day     simvastatin (ZOCOR) 20 MG tablet Take  1 tablet  at Bedtime      Allergies  Allergen Reactions   Fosamax [Alendronate Sodium] Other (See Comments)    GI upset    Singulair [Montelukast Sodium] Other (See Comments)    Makes pt jittery   Wellbutrin [Bupropion] Hives   Clindamycin/Lincomycin Rash   Sulfa Antibiotics Rash     Past Medical History:  Diagnosis Date   Arthritis    Benign labile hypertension    Cancer ([place])    skin cancer on face   Colitis     DJD (degenerative joint disease)    Endometrial polyp    GERD (gastroesophageal reflux disease)    History of basal cell carcinoma excision    History of esophagitis    HOH (hard of hearing)    right ear better than left  -- wears no aides   Hyperlipidemia    Hypothyroidism    Neuromuscular disorder ([place])    neuropathy in feet   Osteopenia    Prediabetes    S/P right hip fracture 08/17/2020   Vitamin D deficiency    Wears glasses      Health Maintenance  Topic Date Due   Pneumonia Vaccine 34+ Years old (2 - PPSV23 or PCV20) 05/04/2016   COVID-19 Vaccine (5 - Booster for Pfizer series) 06/09/2021   TETANUS/TDAP  08/21/2022 (Originally 08/21/2021)   INFLUENZA VACCINE  06/19/2022   MAMMOGRAM  01/11/2023   DEXA SCAN  Completed   Zoster Vaccines- Shingrix  Completed   HPV VACCINES  Aged Out    Immunization History  Administered Date(s) Administered   Influenza, High Dose Seasonal PF  08/22/2018, 08/25/2019, 08/30/2020, 08/21/2021   PFIZER- SARS-COV-2 Vacc 12/10/2019, 12/31/2019, 08/16/2020, 04/14/2021   Pneumococcal -13 05/05/2015   Pneumococcal-23 08/22/2011   Tdap 08/22/2011   Zoster Recombinat (Shingrix) 09/13/2017, 01/01/2018   Zoster, Live 06/20/2006     05/13/2019 Colonoscopy -  Dr Watt Climes / [facility] with a dx/o Collagenous Colitis.  Last Colon - 10/16/2021 - Dr Deatra Ina, Biopsy of Nodular sigmoid mucosa - Path never received.  Last EGD - 10/17/2019 - Dr Alfredia Ferguson -   Last MGM - 01/11/2022   Past Surgical History:  Procedure Laterality Date   BACK SURGERY  2020   CATARACT EXTRACTION, BILATERAL Bilateral 2020   CONVERSION TO TOTAL HIP Right 01/22/2022   Procedure: CONVERSION TO TOTAL HIP;  Surgeon: Willaim Sheng, MD;  Location: [facility];  Service: Orthopedics;  Laterality: Right;   DILATATION & CURETTAGE/HYSTEROSCOPY WITH MYOSURE N/A 10/27/2014   Procedure: DILATATION & CURETTAGE/HYSTEROSCOPY WITH MYOSURE;  Surgeon: Darlyn Chamber, MD;  Location: [facility];  Service: Gynecology;  Laterality: N/A;   DILATION AND CURETTAGE OF UTERUS     HIP SURGERY Right 04/2020   rod and screw in right hip    MOHS SURGERY  2005   LEFT NASAL BRIDGE FOR BASAL CELL   TONSILLECTOMY  age 61     Family History  Problem Relation Age of Onset   Hypertension Mother    Cancer Mother        COLON   Hyperlipidemia Mother    Cancer Father        BREAST WITH BRAIN METS   Hyperlipidemia Father    Cancer Sister        COLON (TEENS), BREAST (23)   Hyperlipidemia Sister    Diabetes Brother    Hyperlipidemia Brother    Hypertension Brother    Colon cancer Brother 65   Cancer Maternal Grandmother        RENAL   Hyperlipidemia Maternal Grandmother    Diabetes Maternal Grandfather    Stroke Maternal Grandfather    Hyperlipidemia Maternal Grandfather    Hyperlipidemia Paternal Grandmother    Hyperlipidemia Paternal Grandfather    Autism Son    Colitis Son    Autism spectrum disorder Son    Healthy Son      Social History   Tobacco Use   Smoking status: Former    Packs/day: 0.25    Years: 15.00    Total pack years: 3.75    Types: Cigarettes    Quit date: 2002    Years since quitting: 21.5    Passive exposure: Never   Smokeless tobacco: Never   Tobacco comments:    Former light smoker, quit 15+ years ago, can't recall  Vaping Use   Vaping Use: Never used  Substance Use Topics   Alcohol use: Not Currently   Drug use: No      ROS Constitutional: Denies fever, chills, weight loss/gain, headaches, insomnia,  night sweats, and change in appetite. Does c/o fatigue. Eyes: Denies redness, blurred vision, diplopia, discharge, itchy, watery eyes.  ENT: Denies discharge, congestion, post nasal drip, epistaxis, sore throat, earache, hearing loss, dental pain, Tinnitus, Vertigo, Sinus pain, snoring.  Cardio: Denies chest pain, palpitations, irregular heartbeat, syncope, dyspnea, diaphoresis, orthopnea, PND, claudication,  edema Respiratory: denies cough, dyspnea, DOE, pleurisy, hoarseness, laryngitis, wheezing.  Gastrointestinal: Denies dysphagia, heartburn, reflux, water brash, pain, cramps, nausea, vomiting, bloating, diarrhea, constipation, hematemesis, melena, hematochezia, jaundice, hemorrhoids Genitourinary: Denies dysuria, frequency, urgency, nocturia, hesitancy, discharge, hematuria, flank pain Breast: Breast lumps, nipple discharge, bleeding.  Musculoskeletal: Denies arthralgia, myalgia, stiffness, Jt. Swelling, pain, limp, and strain/sprain. Denies falls. Skin: Denies puritis, rash, hives, warts, acne, eczema, changing in skin lesion Neuro: No weakness, tremor, incoordination, spasms, paresthesia, pain Psychiatric: Denies confusion, memory loss, sensory loss. Denies Depression. Endocrine: Denies change in weight, skin, hair change, nocturia, and paresthesia, diabetic polys, visual blurring, hyper / hypo glycemic episodes.  Heme/Lymph: No excessive bleeding, bruising, enlarged lymph nodes.  Physical Exam  There were no vitals taken for this visit.  General Appearance: Well nourished, well groomed and in no apparent distress.  Eyes: PERRLA, EOMs, conjunctiva no swelling or erythema, normal fundi and vessels. Sinuses: No frontal/maxillary tenderness ENT/Mouth: EACs patent / TMs  nl. Nares clear without erythema, swelling, mucoid exudates.  Oral hygiene is good. No erythema, swelling, or exudate. Tongue normal, non-obstructing. Tonsils not swollen or erythematous. Hearing normal.  Neck: Supple, thyroid not palpable. No bruits, nodes or JVD. Respiratory: Respiratory effort normal.  BS equal and clear bilateral without rales, rhonci, wheezing or stridor. Cardio: Heart sounds are normal with regular rate and rhythm and no murmurs, rubs or gallops. Peripheral pulses are normal and equal bilaterally without edema. No aortic or femoral bruits. Chest: symmetric with normal excursions and percussion. Breasts:  Symmetric, without lumps, nipple discharge, retractions, or fibrocystic changes.  Abdomen: Flat, soft with bowel sounds active. Nontender, no guarding, rebound, hernias, masses, or organomegaly.  Lymphatics: Non tender without lymphadenopathy.  Musculoskeletal: Full ROM all peripheral extremities, joint stability, 5/5 strength, and normal gait. Skin: Warm and dry without rashes, lesions, cyanosis, clubbing or  ecchymosis.  Neuro: Cranial nerves intact, reflexes equal bilaterally. Normal muscle tone, no cerebellar symptoms. Sensation intact.  Pysch: Alert and oriented X 3, normal affect, Insight and Judgment appropriate.    Assessment and Plan    1. Essential hypertension  - EKG 12-Lead - Urinalysis, Routine w reflex microscopic - Microalbumin / creatinine urine ratio - CBC with Differential/Platelet - COMPLETE METABOLIC PANEL WITH GFR - Magnesium - TSH  2. Hyperlipidemia, mixed  - EKG 12-Lead - Lipid panel - TSH  3. Abnormal glucose  - EKG 12-Lead - Hemoglobin A1c - Insulin, random  4. Vitamin D deficiency  - VITAMIN D 25 Hydroxy   5. Hypothyroidism, unspecified type  - TSH  6. Gastroesophageal reflux disease   - CBC with Differential/Platelet  7. Complete right bundle branch block (RBBB)   8. Age-related osteoporosis with current pathological fracture  - COMPLETE METABOLIC PANEL WITH GFR  9. Screening for colorectal cancer   10. Screening for heart disease  - EKG 12-Lead  11. FHx: heart disease  - EKG 12-Lead  12. Medication management  - Urinalysis, Routine w reflex microscopic - Microalbumin / creatinine urine ratio - CBC with Differential/Platelet - COMPLETE METABOLIC PANEL WITH GFR - Magnesium - Lipid panel - TSH - Hemoglobin A1c - Insulin, random - VITAMIN D 25 Hydroxy           Patient was counseled in prudent diet to achieve/maintain BMI less than 25 for weight control, BP monitoring, regular exercise and medications. Discussed  med's effects and SE's. Screening labs and tests as requested with regular follow-up as recommended. Over 40 minutes of exam, counseling, chart review and high complex critical decision making was performed.   Kirtland Bouchard, MD

## 2022-06-18 ENCOUNTER — Encounter: Payer: Self-pay | Admitting: Internal Medicine

## 2022-06-18 ENCOUNTER — Ambulatory Visit (INDEPENDENT_AMBULATORY_CARE_PROVIDER_SITE_OTHER): Payer: Medicare Other | Admitting: Internal Medicine

## 2022-06-18 VITALS — BP 108/68 | HR 72 | Temp 97.8°F | Resp 17 | Ht 64.0 in | Wt 109.6 lb

## 2022-06-18 DIAGNOSIS — I4510 Unspecified right bundle-branch block: Secondary | ICD-10-CM

## 2022-06-18 DIAGNOSIS — E039 Hypothyroidism, unspecified: Secondary | ICD-10-CM | POA: Diagnosis not present

## 2022-06-18 DIAGNOSIS — E559 Vitamin D deficiency, unspecified: Secondary | ICD-10-CM | POA: Diagnosis not present

## 2022-06-18 DIAGNOSIS — Z8249 Family history of ischemic heart disease and other diseases of the circulatory system: Secondary | ICD-10-CM

## 2022-06-18 DIAGNOSIS — Z79899 Other long term (current) drug therapy: Secondary | ICD-10-CM

## 2022-06-18 DIAGNOSIS — R7309 Other abnormal glucose: Secondary | ICD-10-CM

## 2022-06-18 DIAGNOSIS — K529 Noninfective gastroenteritis and colitis, unspecified: Secondary | ICD-10-CM

## 2022-06-18 DIAGNOSIS — Z1211 Encounter for screening for malignant neoplasm of colon: Secondary | ICD-10-CM

## 2022-06-18 DIAGNOSIS — K2100 Gastro-esophageal reflux disease with esophagitis, without bleeding: Secondary | ICD-10-CM | POA: Diagnosis not present

## 2022-06-18 DIAGNOSIS — E782 Mixed hyperlipidemia: Secondary | ICD-10-CM | POA: Diagnosis not present

## 2022-06-18 DIAGNOSIS — I10 Essential (primary) hypertension: Secondary | ICD-10-CM

## 2022-06-18 DIAGNOSIS — Z136 Encounter for screening for cardiovascular disorders: Secondary | ICD-10-CM | POA: Diagnosis not present

## 2022-06-18 DIAGNOSIS — M8000XA Age-related osteoporosis with current pathological fracture, unspecified site, initial encounter for fracture: Secondary | ICD-10-CM

## 2022-06-18 MED ORDER — SODIUM CHLORIDE 0.9 % IV SOLN: 4.0200 mg/kg | Freq: Once | INTRAVENOUS | Status: DC

## 2022-06-19 ENCOUNTER — Other Ambulatory Visit: Payer: Self-pay | Admitting: Internal Medicine

## 2022-06-19 ENCOUNTER — Ambulatory Visit (INDEPENDENT_AMBULATORY_CARE_PROVIDER_SITE_OTHER): Payer: Medicare Other | Admitting: *Deleted

## 2022-06-19 DIAGNOSIS — N3000 Acute cystitis without hematuria: Secondary | ICD-10-CM

## 2022-06-19 DIAGNOSIS — M8000XG Age-related osteoporosis with current pathological fracture, unspecified site, subsequent encounter for fracture with delayed healing: Secondary | ICD-10-CM | POA: Diagnosis not present

## 2022-06-19 LAB — COMPLETE METABOLIC PANEL WITH GFR
AG Ratio: 1.8 (calc) (ref 1.0–2.5)
ALT: 12 U/L (ref 6–29)
AST: 21 U/L (ref 10–35)
Albumin: 3.9 g/dL (ref 3.6–5.1)
Alkaline phosphatase (APISO): 45 U/L (ref 37–153)
BUN: 14 mg/dL (ref 7–25)
CO2: 26 mmol/L (ref 20–32)
Calcium: 9.7 mg/dL (ref 8.6–10.4)
Chloride: 104 mmol/L (ref 98–110)
Creat: 0.88 mg/dL (ref 0.60–0.95)
Globulin: 2.2 g/dL (calc) (ref 1.9–3.7)
Glucose, Bld: 109 mg/dL — ABNORMAL HIGH (ref 65–99)
Potassium: 3.9 mmol/L (ref 3.5–5.3)
Sodium: 139 mmol/L (ref 135–146)
Total Bilirubin: 0.4 mg/dL (ref 0.2–1.2)
Total Protein: 6.1 g/dL (ref 6.1–8.1)
eGFR: 66 mL/min/{1.73_m2} (ref >=60)

## 2022-06-19 LAB — VITAMIN D 25 HYDROXY (VIT D DEFICIENCY, FRACTURES): Vit D, 25-Hydroxy: 78 ng/mL (ref 30–100)

## 2022-06-19 LAB — CBC WITH DIFFERENTIAL/PLATELET
Absolute Monocytes: 524 cells/uL (ref 200–950)
Basophils Absolute: 22 cells/uL (ref 0–200)
Basophils Relative: 0.7 %
Eosinophils Absolute: 81 cells/uL (ref 15–500)
Eosinophils Relative: 2.6 %
HCT: 32.4 % — ABNORMAL LOW (ref 35.0–45.0)
Hemoglobin: 10.7 g/dL — ABNORMAL LOW (ref 11.7–15.5)
Lymphs Abs: 1221 cells/uL (ref 850–3900)
MCH: 30.3 pg (ref 27.0–33.0)
MCHC: 33 g/dL (ref 32.0–36.0)
MCV: 91.8 fL (ref 80.0–100.0)
MPV: 10.7 fL (ref 7.5–12.5)
Monocytes Relative: 16.9 %
Neutro Abs: 1252 cells/uL — ABNORMAL LOW (ref 1500–7800)
Neutrophils Relative %: 40.4 %
Platelets: 221 10*3/uL (ref 140–400)
RBC: 3.53 10*6/uL — ABNORMAL LOW (ref 3.80–5.10)
RDW: 16.9 % — ABNORMAL HIGH (ref 11.0–15.0)
Total Lymphocyte: 39.4 %
WBC: 3.1 10*3/uL — ABNORMAL LOW (ref 3.8–10.8)

## 2022-06-19 LAB — HEMOGLOBIN A1C
Hgb A1c MFr Bld: 5.7 % of total Hgb — ABNORMAL HIGH (ref <5.7)
Mean Plasma Glucose: 117 mg/dL
eAG (mmol/L): 6.5 mmol/L

## 2022-06-19 LAB — INSULIN, RANDOM: Insulin: 10.8 u[IU]/mL

## 2022-06-19 LAB — URINALYSIS, ROUTINE W REFLEX MICROSCOPIC
Bilirubin Urine: NEGATIVE
Glucose, UA: NEGATIVE
Hgb urine dipstick: NEGATIVE
Hyaline Cast: NONE SEEN /LPF
Ketones, ur: NEGATIVE
Nitrite: NEGATIVE
Protein, ur: NEGATIVE
RBC / HPF: NONE SEEN /HPF (ref 0–2)
Specific Gravity, Urine: 1.006 (ref 1.001–1.035)
Squamous Epithelial / HPF: NONE SEEN /HPF (ref <=5)
pH: 6 (ref 5.0–8.0)

## 2022-06-19 LAB — MICROALBUMIN / CREATININE URINE RATIO
Creatinine, Urine: 34 mg/dL (ref 20–275)
Microalb, Ur: <0.2 mg/dL

## 2022-06-19 LAB — MICROSCOPIC MESSAGE

## 2022-06-19 LAB — LIPID PANEL
Cholesterol: 132 mg/dL (ref <200)
HDL: 59 mg/dL (ref >=50)
LDL Cholesterol (Calc): 59 mg/dL (calc)
Non-HDL Cholesterol (Calc): 73 mg/dL (calc) (ref <130)
Total CHOL/HDL Ratio: 2.2 (calc) (ref <5.0)
Triglycerides: 64 mg/dL (ref <150)

## 2022-06-19 LAB — TSH: TSH: 2.22 mIU/L (ref 0.40–4.50)

## 2022-06-19 LAB — MAGNESIUM: Magnesium: 1.6 mg/dL (ref 1.5–2.5)

## 2022-06-19 MED ORDER — ROMOSOZUMAB-AQQG 105 MG/1.17ML ~~LOC~~ SOSY
210.0000 mg | PREFILLED_SYRINGE | Freq: Once | SUBCUTANEOUS | Status: AC
  Administered 2022-06-19: 210 mg via SUBCUTANEOUS

## 2022-06-19 NOTE — Telephone Encounter (Signed)
Error below. Patient did not receive her 4th Evenity on 04/23/22. She received her 4th Evenity on 06/19/22.

## 2022-06-19 NOTE — Progress Notes (Signed)
Patient is here for nurse visit for her 4th Evenity injection. Patient received bilateral [place] arm injections. She tolerated injections well. She will return in 1 month for her next Evenity injection.

## 2022-06-19 NOTE — Progress Notes (Signed)
<><><><><><><><><><><><><><><><><><><><><><><><><><><><><><><><><> <><><><><><><><><><><><><><><><><><><><><><><><><><><><><><><><><> -   Test results slightly outside the reference range are not unusual. If there is anything important, I will review this with you,  otherwise it is considered normal test values.  If you have further questions,  please do not hesitate to contact me at the office or via My Chart.  <><><><><><><><><><><><><><><><><><><><><><><><><><><><><><><><><> <><><><><><><><><><><><><><><><><><><><><><><><><><><><><><><><><>  -  U/A is suspicious for UTI ,  so have asked angie to do a Urine Culture  (U/C) to check for bacterial Infection <><><><><><><><><><><><><><><><><><><><><><><><><><><><><><><><><>  -  Mild chronic anemia is stable  <><><><><><><><><><><><><><><><><><><><><><><><><><><><><><><><><>  -  A1c = 5.7% - Glucose is borderline elevated, So    - Avoid Sweets, Candy & White Stuff   - White Rice, [place], White Flour  - Breads &  Pasta <><><><><><><><><><><><><><><><><><><><><><><><><><><><><><><><><>  -   Magnesium  -     -  very  low- goal is betw 2.0 - 2.5,   - So..........Marland Kitchen  Recommend that you take Magnesium 500 mg tablet x 2 tablets / Daily                           ( Suggest get a Magnesium CHELATE from [employer]                                                                                  - less likely to cause Diarrhea )    - also important to eat lots of  leafy green vegetables   - spinach - Kale - collards - greens - okra - asparagus  - broccoli - quinoa - squash - almonds   - black, red, white beans  -  peas - green beans <><><><><><><><><><><><><><><><><><><><><><><><><><><><><><><><><> <><><><><><><><><><><><><><><><><><><><><><><><><><><><><><><><><>  -  Vitamin D= 78 - Excellent  - Please keep dose same  <><><><><><><><><><><><><><><><><><><><><><><><><><><><><><><><><>  -  Total Chol = 132    &   LDL Chol = 59     -     Both . Excellent   - Very low risk for Heart Attack  / Stroke <><><><><><><><><><><><><><><><><><><><><><><><><><><><><><><><><>  -  All Else - CBC - Kidneys - Electrolytes - Liver - Magnesium & Thyroid    - all  Normal / OK <><><><><><><><><><><><><><><><><><><><><><><><><><><><><><><><><> <><><><><><><><><><><><><><><><><><><><><><><><><><><><><><><><><>                         -

## 2022-06-20 DIAGNOSIS — N3000 Acute cystitis without hematuria: Secondary | ICD-10-CM | POA: Diagnosis not present

## 2022-06-20 DIAGNOSIS — K5100 Ulcerative (chronic) pancolitis without complications: Secondary | ICD-10-CM | POA: Diagnosis not present

## 2022-06-20 NOTE — Telephone Encounter (Addendum)
Evenity Injection Schedule Inj #1 - 01/02/22 Inj #2 - 02/14/22 Inj #3 - 03/21/22 Inj #4 - 06/19/22 Inj #5 - 07/27/22 Inj #6 - 08/28/22 Inj #7 - 09/28/22 Inj #8 - 10/29/22 VOB initiated for 2024: COVERAGE DETAILS: Evenity and administration will be subject to a $240.00 deductible ($0.00 met) and 20.0%. The benefits provided on this Verification of Benefits form are the patient's In-Network benefits for Evenity. We have provided Medical benefits only. At this time [employer] does not provide Pharmacy benefits for Evenity. COVERAGE DETAILS: For the Secondary MD Purchase access option, this is a Medicare Supplement Plan F and it covers the Medicare Part B deductible, co-insurance and 100% of the excess charges. The benefits provided on this Verification of Benefits form are the patient's In-Network benefits for Evenity. We have provided Medical benefits only. At this time [employer] does not provide Pharmacy benefits for Evenity. OOP Cost due at time of visit: $0 Inj #9 - 12/17/22 Inj #10 - 01/18/23 Inj #11 - 02/20/23 Inj #12 - 03/26/23  Evenity injection series complete

## 2022-06-22 ENCOUNTER — Encounter (HOSPITAL_COMMUNITY): Payer: Self-pay | Admitting: Emergency Medicine

## 2022-06-22 ENCOUNTER — Inpatient Hospital Stay (HOSPITAL_COMMUNITY)
Admission: EM | Admit: 2022-06-22 | Discharge: 2022-06-24 | DRG: 871 | Disposition: A | Discharge status: Home / Self Care | Payer: Medicare Other | Attending: Internal Medicine | Admitting: Internal Medicine

## 2022-06-22 ENCOUNTER — Emergency Department (HOSPITAL_COMMUNITY): Payer: Medicare Other

## 2022-06-22 ENCOUNTER — Other Ambulatory Visit: Payer: Self-pay | Admitting: Internal Medicine

## 2022-06-22 ENCOUNTER — Other Ambulatory Visit: Payer: Self-pay

## 2022-06-22 DIAGNOSIS — Z20822 Contact with and (suspected) exposure to covid-19: Secondary | ICD-10-CM | POA: Diagnosis present

## 2022-06-22 DIAGNOSIS — R0689 Other abnormalities of breathing: Secondary | ICD-10-CM | POA: Diagnosis not present

## 2022-06-22 DIAGNOSIS — R7303 Prediabetes: Secondary | ICD-10-CM | POA: Diagnosis present

## 2022-06-22 DIAGNOSIS — K219 Gastro-esophageal reflux disease without esophagitis: Secondary | ICD-10-CM | POA: Diagnosis present

## 2022-06-22 DIAGNOSIS — Z96641 Presence of right artificial hip joint: Secondary | ICD-10-CM | POA: Diagnosis present

## 2022-06-22 DIAGNOSIS — Z79890 Hormone replacement therapy (postmenopausal): Secondary | ICD-10-CM

## 2022-06-22 DIAGNOSIS — D61818 Other pancytopenia: Secondary | ICD-10-CM | POA: Diagnosis present

## 2022-06-22 DIAGNOSIS — I10 Essential (primary) hypertension: Secondary | ICD-10-CM | POA: Diagnosis not present

## 2022-06-22 DIAGNOSIS — R000 Tachycardia, unspecified: Secondary | ICD-10-CM | POA: Diagnosis not present

## 2022-06-22 DIAGNOSIS — R531 Weakness: Secondary | ICD-10-CM | POA: Diagnosis not present

## 2022-06-22 DIAGNOSIS — E559 Vitamin D deficiency, unspecified: Secondary | ICD-10-CM | POA: Diagnosis present

## 2022-06-22 DIAGNOSIS — D638 Anemia in other chronic diseases classified elsewhere: Secondary | ICD-10-CM | POA: Diagnosis present

## 2022-06-22 DIAGNOSIS — K51919 Ulcerative colitis, unspecified with unspecified complications: Secondary | ICD-10-CM | POA: Diagnosis not present

## 2022-06-22 DIAGNOSIS — Z79899 Other long term (current) drug therapy: Secondary | ICD-10-CM

## 2022-06-22 DIAGNOSIS — E782 Mixed hyperlipidemia: Secondary | ICD-10-CM | POA: Diagnosis present

## 2022-06-22 DIAGNOSIS — E876 Hypokalemia: Secondary | ICD-10-CM | POA: Diagnosis present

## 2022-06-22 DIAGNOSIS — Z800 Family history of malignant neoplasm of digestive organs: Secondary | ICD-10-CM | POA: Diagnosis not present

## 2022-06-22 DIAGNOSIS — Z823 Family history of stroke: Secondary | ICD-10-CM | POA: Diagnosis not present

## 2022-06-22 DIAGNOSIS — I4510 Unspecified right bundle-branch block: Secondary | ICD-10-CM | POA: Diagnosis present

## 2022-06-22 DIAGNOSIS — R6510 Systemic inflammatory response syndrome (SIRS) of non-infectious origin without acute organ dysfunction: Secondary | ICD-10-CM | POA: Diagnosis present

## 2022-06-22 DIAGNOSIS — L0390 Cellulitis, unspecified: Secondary | ICD-10-CM | POA: Diagnosis present

## 2022-06-22 DIAGNOSIS — D696 Thrombocytopenia, unspecified: Secondary | ICD-10-CM | POA: Diagnosis present

## 2022-06-22 DIAGNOSIS — M6588 Other synovitis and tenosynovitis, other site: Secondary | ICD-10-CM | POA: Diagnosis present

## 2022-06-22 DIAGNOSIS — Z0389 Encounter for observation for other suspected diseases and conditions ruled out: Secondary | ICD-10-CM | POA: Diagnosis not present

## 2022-06-22 DIAGNOSIS — M25531 Pain in right wrist: Secondary | ICD-10-CM | POA: Diagnosis not present

## 2022-06-22 DIAGNOSIS — Z85828 Personal history of other malignant neoplasm of skin: Secondary | ICD-10-CM

## 2022-06-22 DIAGNOSIS — E039 Hypothyroidism, unspecified: Secondary | ICD-10-CM | POA: Diagnosis present

## 2022-06-22 DIAGNOSIS — K2100 Gastro-esophageal reflux disease with esophagitis, without bleeding: Secondary | ICD-10-CM | POA: Diagnosis not present

## 2022-06-22 DIAGNOSIS — N390 Urinary tract infection, site not specified: Secondary | ICD-10-CM | POA: Diagnosis not present

## 2022-06-22 DIAGNOSIS — A4151 Sepsis due to Escherichia coli [E. coli]: Secondary | ICD-10-CM | POA: Diagnosis present

## 2022-06-22 DIAGNOSIS — R410 Disorientation, unspecified: Secondary | ICD-10-CM | POA: Diagnosis not present

## 2022-06-22 DIAGNOSIS — M65931 Unspecified synovitis and tenosynovitis, right forearm: Secondary | ICD-10-CM | POA: Diagnosis present

## 2022-06-22 DIAGNOSIS — L03113 Cellulitis of right upper limb: Principal | ICD-10-CM

## 2022-06-22 DIAGNOSIS — M65831 Other synovitis and tenosynovitis, right forearm: Secondary | ICD-10-CM | POA: Diagnosis present

## 2022-06-22 DIAGNOSIS — A419 Sepsis, unspecified organism: Secondary | ICD-10-CM | POA: Diagnosis not present

## 2022-06-22 DIAGNOSIS — R0902 Hypoxemia: Secondary | ICD-10-CM | POA: Diagnosis not present

## 2022-06-22 DIAGNOSIS — Z7982 Long term (current) use of aspirin: Secondary | ICD-10-CM

## 2022-06-22 DIAGNOSIS — Z8249 Family history of ischemic heart disease and other diseases of the circulatory system: Secondary | ICD-10-CM

## 2022-06-22 DIAGNOSIS — E871 Hypo-osmolality and hyponatremia: Secondary | ICD-10-CM | POA: Diagnosis present

## 2022-06-22 DIAGNOSIS — N3000 Acute cystitis without hematuria: Secondary | ICD-10-CM

## 2022-06-22 DIAGNOSIS — Z833 Family history of diabetes mellitus: Secondary | ICD-10-CM | POA: Diagnosis not present

## 2022-06-22 DIAGNOSIS — Z87891 Personal history of nicotine dependence: Secondary | ICD-10-CM | POA: Diagnosis not present

## 2022-06-22 DIAGNOSIS — K529 Noninfective gastroenteritis and colitis, unspecified: Secondary | ICD-10-CM | POA: Diagnosis not present

## 2022-06-22 DIAGNOSIS — G9341 Metabolic encephalopathy: Secondary | ICD-10-CM | POA: Diagnosis present

## 2022-06-22 DIAGNOSIS — N200 Calculus of kidney: Secondary | ICD-10-CM | POA: Diagnosis not present

## 2022-06-22 DIAGNOSIS — D649 Anemia, unspecified: Secondary | ICD-10-CM | POA: Diagnosis present

## 2022-06-22 DIAGNOSIS — E785 Hyperlipidemia, unspecified: Secondary | ICD-10-CM | POA: Diagnosis present

## 2022-06-22 LAB — COMPREHENSIVE METABOLIC PANEL
ALT: 16 U/L (ref 0–44)
AST: 28 U/L (ref 15–41)
Albumin: 3.4 g/dL — ABNORMAL LOW (ref 3.5–5.0)
Alkaline Phosphatase: 42 U/L (ref 38–126)
Anion gap: 11 (ref 5–15)
BUN: 9 mg/dL (ref 8–23)
CO2: 22 mmol/L (ref 22–32)
Calcium: 9 mg/dL (ref 8.9–10.3)
Chloride: 103 mmol/L (ref 98–111)
Creatinine, Ser: 0.86 mg/dL (ref 0.44–1.00)
GFR, Estimated: >60 mL/min (ref >60)
Glucose, Bld: 148 mg/dL — ABNORMAL HIGH (ref 70–99)
Potassium: 3.3 mmol/L — ABNORMAL LOW (ref 3.5–5.1)
Sodium: 136 mmol/L (ref 135–145)
Total Bilirubin: 0.6 mg/dL (ref 0.3–1.2)
Total Protein: 5.9 g/dL — ABNORMAL LOW (ref 6.5–8.1)

## 2022-06-22 LAB — URINE CULTURE
MICRO NUMBER:: 13727418
SPECIMEN QUALITY:: ADEQUATE

## 2022-06-22 LAB — PROTIME-INR
INR: 1.1 (ref 0.8–1.2)
Prothrombin Time: 14.4 seconds (ref 11.4–15.2)

## 2022-06-22 LAB — APTT: aPTT: 28 seconds (ref 24–36)

## 2022-06-22 LAB — LACTIC ACID, PLASMA: Lactic Acid, Venous: 1.6 mmol/L (ref 0.5–1.9)

## 2022-06-22 MED ORDER — IBUPROFEN 200 MG PO TABS
400.0000 mg | ORAL_TABLET | Freq: Once | ORAL | Status: DC
  Filled 2022-06-22: qty 1

## 2022-06-22 MED ORDER — CIPROFLOXACIN HCL 250 MG PO TABS: ORAL_TABLET | ORAL | 0 refills | Status: DC

## 2022-06-22 MED ORDER — LACTATED RINGERS IV BOLUS (SEPSIS)
1000.0000 mL | Freq: Once | INTRAVENOUS | Status: AC
  Administered 2022-06-22: 1000 mL via INTRAVENOUS

## 2022-06-22 MED ORDER — LACTATED RINGERS IV SOLN: INTRAVENOUS | Status: DC

## 2022-06-22 MED ORDER — SODIUM CHLORIDE 0.9 % IV SOLN
2.0000 g | INTRAVENOUS | Status: DC
  Administered 2022-06-22 – 2022-06-23 (×2): 2 g via INTRAVENOUS
  Filled 2022-06-22 (×2): qty 20

## 2022-06-22 NOTE — ED Triage Notes (Signed)
Per EMS, pt from home w/ family.  This morning diagnosed w/ UTI (drove self).  Around 5pm she began to get confused, very weak (could not get up) and hot to touch.    650 Tylenol given 131m NS 18 G L AC RR 27 150/86 94% RA  HR 114

## 2022-06-22 NOTE — ED Provider Notes (Signed)
[facility] EMERGENCY DEPARTMENT Provider Note   CSN: [id number] Arrival date & time: 06/22/22  2249     History {Add pertinent medical, surgical, social history, OB history to HPI:1} Chief Complaint  Patient presents with   Fever   Altered Mental Status    Debbie Houston is a 81 y.o. female.  Patient presents to the emergency department for evaluation of fever, generalized weakness and confusion.  Patient reportedly saw her doctor earlier today and was diagnosed with a UTI.  Symptoms have developed over the course of the day since then.  She       Home Medications Prior to Admission medications   Medication Sig Start Date End Date Taking? Authorizing Provider  acetaminophen (TYLENOL) 500 MG tablet Take 500 mg by mouth at bedtime as needed for moderate pain or headache.     [provider]  Ascorbic Acid (VITAMIN C) 1000 MG tablet Take 1,000 mg by mouth daily.     [provider]  aspirin EC 81 MG tablet Take 1 tablet (81 mg total) by mouth in the morning and at bedtime. 01/23/22   Willaim Sheng, MD  bisoprolol (ZEBETA) 5 MG tablet TAKE 1/2 TABLET BY MOUTH EVERY DAY IN THE EVENING FOR BLOOD PRESSURE 08/09/21   Alycia Rossetti, NP  Calcium Carbonate-Vit D-Min (CALCIUM 1200 PO) Take 1,200 mg by mouth daily.    [provider]  cetirizine (ZYRTEC) 10 MG tablet Take 10 mg by mouth at bedtime.    [provider]  CINNAMON PO Take 2,000 mg by mouth daily.    [provider]  ciprofloxacin (CIPRO) 250 MG tablet Take  1 tablet  2 x /day  with Food for UTI 06/22/22   Unk Pinto, MD  Coenzyme Q10 (COQ10) 200 MG CAPS Take 200 mg by mouth daily.    [provider]  colestipol (COLESTID) 1 g tablet Take 4 g by mouth daily. 07/26/20   [provider]  cycloSPORINE (RESTASIS) 0.05 % ophthalmic emulsion Place 1 drop into both eyes 2 (two) times daily.    [provider]  levothyroxine (SYNTHROID)  125 MCG tablet TAKE 1 TABLET BY MOUTH DAILY ON AN EMPTY STOMACH WITH ONLY WATER FOR 30 MINUTES(NO ANTACIDS MEDICATIONS, CALCIUM OR MAGNESIUM FOR 4 HOURS) Patient taking differently: Take 125 mcg by mouth every other day. 12/18/21   Alycia Rossetti, NP  loratadine (CLARITIN) 10 MG tablet Take 10 mg by mouth daily as needed for allergies.    [provider]  Multiple Vitamin (THERA VITAMIN PO) Take 1 tablet by mouth daily.    [provider]  mupirocin ointment (BACTROBAN) 2 % Apply 1 application. topically 2 (two) times daily. 04/23/22   Alycia Rossetti, NP  Omega-3 Fatty Acids (FISH OIL) 1200 MG CAPS Take 1,200 mg by mouth daily.     [provider]  pantoprazole (PROTONIX) 40 MG tablet Take 1 tablet  2 x /day  for Heartburn & Indigestion 03/08/20   Unk Pinto, MD  Probiotic Product (PROBIOTIC PO) Take 1 capsule by mouth daily.    [provider]  PROLIA 60 MG/ML SOSY injection Inject 60 mg into the skin every 6 (six) months. 01/28/20   [provider]  pseudoephedrine (SUDAFED) 120 MG 12 hr tablet Take  1 tablet  2 x /day (every 12 hours)  for Head and Chest Congestion 11/15/21   Unk Pinto, MD  simvastatin (ZOCOR) 20 MG tablet Take  1 tablet  at Bedtime  for Cholesterol                                                 /                        TAKE             BY MOUTH 01/27/22   Unk Pinto, MD      Allergies    Fosamax [alendronate sodium], Singulair [montelukast sodium], Wellbutrin [bupropion], Clindamycin/lincomycin, and Sulfa antibiotics    Review of Systems   Review of Systems  Physical Exam Updated Vital Signs BP (!) 143/77 (BP Location: Right Arm)   Pulse (!) 119   Temp (!) 103 F (39.4 C) (Oral)   Resp (!) 21   Ht 5' 4"  (1.626 m)   Wt 49.9 kg   SpO2 95%   BMI 18.88 kg/m  Physical Exam Vitals and nursing note reviewed.  Constitutional:      General: She is not in acute distress.    Appearance: She is  well-developed.  HENT:     Head: Normocephalic and atraumatic.     Mouth/Throat:     Mouth: Mucous membranes are moist.  Eyes:     General: Vision grossly intact. Gaze aligned appropriately.     Extraocular Movements: Extraocular movements intact.     Conjunctiva/sclera: Conjunctivae normal.  Cardiovascular:     Rate and Rhythm: Regular rhythm. Tachycardia present.     Pulses: Normal pulses.     Heart sounds: Normal heart sounds, S1 normal and S2 normal. No murmur heard.    No friction rub. No gallop.  Pulmonary:     Effort: Pulmonary effort is normal. No respiratory distress.     Breath sounds: Normal breath sounds.  Abdominal:     General: Bowel sounds are normal.     Palpations: Abdomen is soft.     Tenderness: There is no abdominal tenderness. There is no guarding or rebound.     Hernia: No hernia is present.  Musculoskeletal:        General: No swelling.     Cervical back: Full passive range of motion without pain, normal range of motion and neck supple. No spinous process tenderness or muscular tenderness. Normal range of motion.     Right lower leg: No edema.     Left lower leg: No edema.  Skin:    General: Skin is warm and dry.     Capillary Refill: Capillary refill takes less than 2 seconds.     Findings: No ecchymosis, erythema, rash or wound.  Neurological:     General: No focal deficit present.     Mental Status: She is alert. She is confused.     GCS: GCS eye subscore is 4. GCS verbal subscore is 5. GCS motor subscore is 6.     Cranial Nerves: Cranial nerves 2-12 are intact.     Sensory: Sensation is intact.     Motor: Motor function is intact.     Coordination: Coordination is intact.  Psychiatric:        Attention and Perception: Attention normal.        Mood and Affect: Mood normal.        Speech: Speech normal.        Behavior: Behavior normal.  ED Results / Procedures / Treatments   Labs (all labs ordered are listed, but only abnormal results are  displayed) Labs Reviewed  RESP PANEL BY RT-PCR (FLU A&B, COVID) ARPGX2  CULTURE, BLOOD (ROUTINE X 2)  CULTURE, BLOOD (ROUTINE X 2)  URINE CULTURE  LACTIC ACID, PLASMA  LACTIC ACID, PLASMA  COMPREHENSIVE METABOLIC PANEL  CBC WITH DIFFERENTIAL/PLATELET  PROTIME-INR  APTT  URINALYSIS, ROUTINE W REFLEX MICROSCOPIC    EKG None  Radiology No results found.  Procedures Procedures  {Document cardiac monitor, telemetry assessment procedure when appropriate:1}  Medications Ordered in ED Medications  lactated ringers infusion (has no administration in time range)  lactated ringers bolus 1,000 mL (has no administration in time range)  cefTRIAXone (ROCEPHIN) 2 g in sodium chloride 0.9 % 100 mL IVPB (has no administration in time range)    ED Course/ Medical Decision Making/ A&P                           Medical Decision Making Amount and/or Complexity of Data Reviewed Labs: ordered. Radiology: ordered. ECG/medicine tests: ordered.  Risk Prescription drug management.   ***  {Document critical care time when appropriate:1} {Document review of labs and clinical decision tools ie heart score, Chads2Vasc2 etc:1}  {Document your independent review of radiology images, and any outside records:1} {Document your discussion with family members, caretakers, and with consultants:1} {Document social determinants of health affecting pt's care:1} {Document your decision making why or why not admission, treatments were needed:1} Final Clinical Impression(s) / ED Diagnoses Final diagnoses:  None    Rx / DC Orders ED Discharge Orders     None

## 2022-06-22 NOTE — Progress Notes (Signed)
<><><><><><><><><><><><><><><><><><><><><><><><><><><><><><><><><> <><><><><><><><><><><><><><><><><><><><><><><><><><><><><><><><><>  -   Urine culture (+) positive for Infection  - Cipro antibiotic sent in for 2 x /day for 7 days   - Please call to schedule Nurse visit in 1 month to                               recheck urine to assure infection cleared up.   <><><><><><><><><><><><><><><><><><><><><><><><><><><><><><><><><> <><><><><><><><><><><><><><><><><><><><><><><><><><><><><><><><><>

## 2022-06-23 ENCOUNTER — Inpatient Hospital Stay (HOSPITAL_COMMUNITY): Payer: Medicare Other

## 2022-06-23 ENCOUNTER — Emergency Department (HOSPITAL_COMMUNITY): Payer: Medicare Other

## 2022-06-23 ENCOUNTER — Encounter (HOSPITAL_COMMUNITY): Payer: Self-pay | Admitting: Internal Medicine

## 2022-06-23 DIAGNOSIS — I4510 Unspecified right bundle-branch block: Secondary | ICD-10-CM | POA: Diagnosis present

## 2022-06-23 DIAGNOSIS — M65831 Other synovitis and tenosynovitis, right forearm: Secondary | ICD-10-CM | POA: Diagnosis present

## 2022-06-23 DIAGNOSIS — L03113 Cellulitis of right upper limb: Secondary | ICD-10-CM | POA: Diagnosis not present

## 2022-06-23 DIAGNOSIS — G9341 Metabolic encephalopathy: Secondary | ICD-10-CM | POA: Diagnosis present

## 2022-06-23 DIAGNOSIS — Z79899 Other long term (current) drug therapy: Secondary | ICD-10-CM | POA: Diagnosis not present

## 2022-06-23 DIAGNOSIS — N200 Calculus of kidney: Secondary | ICD-10-CM | POA: Diagnosis not present

## 2022-06-23 DIAGNOSIS — K529 Noninfective gastroenteritis and colitis, unspecified: Secondary | ICD-10-CM

## 2022-06-23 DIAGNOSIS — N390 Urinary tract infection, site not specified: Secondary | ICD-10-CM | POA: Diagnosis present

## 2022-06-23 DIAGNOSIS — A419 Sepsis, unspecified organism: Secondary | ICD-10-CM | POA: Diagnosis not present

## 2022-06-23 DIAGNOSIS — Z833 Family history of diabetes mellitus: Secondary | ICD-10-CM | POA: Diagnosis not present

## 2022-06-23 DIAGNOSIS — E559 Vitamin D deficiency, unspecified: Secondary | ICD-10-CM | POA: Diagnosis present

## 2022-06-23 DIAGNOSIS — Z87891 Personal history of nicotine dependence: Secondary | ICD-10-CM | POA: Diagnosis not present

## 2022-06-23 DIAGNOSIS — E782 Mixed hyperlipidemia: Secondary | ICD-10-CM

## 2022-06-23 DIAGNOSIS — K2100 Gastro-esophageal reflux disease with esophagitis, without bleeding: Secondary | ICD-10-CM | POA: Diagnosis not present

## 2022-06-23 DIAGNOSIS — Z800 Family history of malignant neoplasm of digestive organs: Secondary | ICD-10-CM | POA: Diagnosis not present

## 2022-06-23 DIAGNOSIS — R7303 Prediabetes: Secondary | ICD-10-CM | POA: Diagnosis present

## 2022-06-23 DIAGNOSIS — K219 Gastro-esophageal reflux disease without esophagitis: Secondary | ICD-10-CM | POA: Diagnosis present

## 2022-06-23 DIAGNOSIS — E876 Hypokalemia: Secondary | ICD-10-CM | POA: Diagnosis not present

## 2022-06-23 DIAGNOSIS — E039 Hypothyroidism, unspecified: Secondary | ICD-10-CM | POA: Diagnosis present

## 2022-06-23 DIAGNOSIS — A4151 Sepsis due to Escherichia coli [E. coli]: Secondary | ICD-10-CM | POA: Diagnosis present

## 2022-06-23 DIAGNOSIS — L0390 Cellulitis, unspecified: Secondary | ICD-10-CM | POA: Diagnosis present

## 2022-06-23 DIAGNOSIS — I10 Essential (primary) hypertension: Secondary | ICD-10-CM | POA: Diagnosis present

## 2022-06-23 DIAGNOSIS — Z79890 Hormone replacement therapy (postmenopausal): Secondary | ICD-10-CM | POA: Diagnosis not present

## 2022-06-23 DIAGNOSIS — R531 Weakness: Secondary | ICD-10-CM

## 2022-06-23 DIAGNOSIS — D696 Thrombocytopenia, unspecified: Secondary | ICD-10-CM | POA: Diagnosis present

## 2022-06-23 DIAGNOSIS — Z96641 Presence of right artificial hip joint: Secondary | ICD-10-CM | POA: Diagnosis present

## 2022-06-23 DIAGNOSIS — Z85828 Personal history of other malignant neoplasm of skin: Secondary | ICD-10-CM | POA: Diagnosis not present

## 2022-06-23 DIAGNOSIS — D61818 Other pancytopenia: Secondary | ICD-10-CM | POA: Diagnosis present

## 2022-06-23 DIAGNOSIS — D638 Anemia in other chronic diseases classified elsewhere: Secondary | ICD-10-CM | POA: Diagnosis present

## 2022-06-23 DIAGNOSIS — E871 Hypo-osmolality and hyponatremia: Secondary | ICD-10-CM | POA: Diagnosis present

## 2022-06-23 DIAGNOSIS — R6510 Systemic inflammatory response syndrome (SIRS) of non-infectious origin without acute organ dysfunction: Secondary | ICD-10-CM

## 2022-06-23 DIAGNOSIS — M6588 Other synovitis and tenosynovitis, other site: Secondary | ICD-10-CM | POA: Diagnosis present

## 2022-06-23 DIAGNOSIS — D649 Anemia, unspecified: Secondary | ICD-10-CM

## 2022-06-23 DIAGNOSIS — Z8249 Family history of ischemic heart disease and other diseases of the circulatory system: Secondary | ICD-10-CM | POA: Diagnosis not present

## 2022-06-23 DIAGNOSIS — M25531 Pain in right wrist: Secondary | ICD-10-CM | POA: Diagnosis present

## 2022-06-23 DIAGNOSIS — Z7982 Long term (current) use of aspirin: Secondary | ICD-10-CM | POA: Diagnosis not present

## 2022-06-23 DIAGNOSIS — Z20822 Contact with and (suspected) exposure to covid-19: Secondary | ICD-10-CM | POA: Diagnosis present

## 2022-06-23 DIAGNOSIS — Z823 Family history of stroke: Secondary | ICD-10-CM | POA: Diagnosis not present

## 2022-06-23 LAB — URIC ACID: Uric Acid, Serum: 5.5 mg/dL (ref 2.5–7.1)

## 2022-06-23 LAB — URINALYSIS, ROUTINE W REFLEX MICROSCOPIC
Bilirubin Urine: NEGATIVE
Glucose, UA: NEGATIVE mg/dL
Hgb urine dipstick: NEGATIVE
Ketones, ur: NEGATIVE mg/dL
Leukocytes,Ua: NEGATIVE
Nitrite: NEGATIVE
Protein, ur: NEGATIVE mg/dL
Specific Gravity, Urine: 1.008 (ref 1.005–1.030)
pH: 7 (ref 5.0–8.0)

## 2022-06-23 LAB — CBC WITH DIFFERENTIAL/PLATELET
Abs Immature Granulocytes: 0 10*3/uL (ref 0.00–0.07)
Basophils Absolute: 0 10*3/uL (ref 0.0–0.1)
Basophils Relative: 0 %
Eosinophils Absolute: 0.2 10*3/uL (ref 0.0–0.5)
Eosinophils Relative: 3 %
HCT: 30.2 % — ABNORMAL LOW (ref 36.0–46.0)
Hemoglobin: 10.2 g/dL — ABNORMAL LOW (ref 12.0–15.0)
Lymphocytes Relative: 4 %
Lymphs Abs: 0.3 10*3/uL — ABNORMAL LOW (ref 0.7–4.0)
MCH: 31.1 pg (ref 26.0–34.0)
MCHC: 33.8 g/dL (ref 30.0–36.0)
MCV: 92.1 fL (ref 80.0–100.0)
Monocytes Absolute: 0.1 10*3/uL (ref 0.1–1.0)
Monocytes Relative: 2 %
Neutro Abs: 5.8 10*3/uL (ref 1.7–7.7)
Neutrophils Relative %: 91 %
Platelets: 138 10*3/uL — ABNORMAL LOW (ref 150–400)
RBC: 3.28 MIL/uL — ABNORMAL LOW (ref 3.87–5.11)
RDW: 18 % — ABNORMAL HIGH (ref 11.5–15.5)
WBC: 6.4 10*3/uL (ref 4.0–10.5)
nRBC: 0 % (ref 0.0–0.2)
nRBC: 0 /100 WBC

## 2022-06-23 LAB — RESPIRATORY PANEL BY PCR

## 2022-06-23 LAB — RESP PANEL BY RT-PCR (FLU A&B, COVID) ARPGX2
Influenza A by PCR: NEGATIVE
Influenza B by PCR: NEGATIVE
SARS Coronavirus 2 by RT PCR: NEGATIVE

## 2022-06-23 LAB — C DIFFICILE QUICK SCREEN W PCR REFLEX
C Diff antigen: NEGATIVE
C Diff interpretation: NOT DETECTED
C Diff toxin: NEGATIVE

## 2022-06-23 LAB — C-REACTIVE PROTEIN: CRP: 2.1 mg/dL — ABNORMAL HIGH (ref <1.0)

## 2022-06-23 LAB — LACTIC ACID, PLASMA: Lactic Acid, Venous: 1.4 mmol/L (ref 0.5–1.9)

## 2022-06-23 LAB — SEDIMENTATION RATE: Sed Rate: 35 mm/hr — ABNORMAL HIGH (ref 0–22)

## 2022-06-23 MED ORDER — SODIUM CHLORIDE 0.9% FLUSH
3.0000 mL | Freq: Two times a day (BID) | INTRAVENOUS | Status: DC
  Administered 2022-06-23 – 2022-06-24 (×3): 3 mL via INTRAVENOUS

## 2022-06-23 MED ORDER — ACETAMINOPHEN 325 MG PO TABS: 650.0000 mg | ORAL_TABLET | Freq: Four times a day (QID) | ORAL | Status: DC | PRN

## 2022-06-23 MED ORDER — BISOPROLOL FUMARATE 5 MG PO TABS
2.5000 mg | ORAL_TABLET | Freq: Every day | ORAL | Status: DC
  Administered 2022-06-23 – 2022-06-24 (×2): 2.5 mg via ORAL
  Filled 2022-06-23 (×2): qty 0.5

## 2022-06-23 MED ORDER — CYCLOSPORINE 0.05 % OP EMUL
1.0000 [drp] | Freq: Two times a day (BID) | OPHTHALMIC | Status: DC
  Administered 2022-06-23 – 2022-06-24 (×3): 1 [drp] via OPHTHALMIC
  Filled 2022-06-23 (×4): qty 30

## 2022-06-23 MED ORDER — PANTOPRAZOLE SODIUM 40 MG PO TBEC
40.0000 mg | DELAYED_RELEASE_TABLET | Freq: Two times a day (BID) | ORAL | Status: DC
  Administered 2022-06-23 – 2022-06-24 (×3): 40 mg via ORAL
  Filled 2022-06-23 (×3): qty 1

## 2022-06-23 MED ORDER — IOHEXOL 9 MG/ML PO SOLN
ORAL | Status: AC
  Filled 2022-06-23: qty 1000

## 2022-06-23 MED ORDER — COLESTIPOL HCL 1 G PO TABS
4.0000 g | ORAL_TABLET | Freq: Every day | ORAL | Status: DC
  Administered 2022-06-23 – 2022-06-24 (×2): 4 g via ORAL
  Filled 2022-06-23 (×2): qty 4

## 2022-06-23 MED ORDER — ASPIRIN 81 MG PO TBEC
81.0000 mg | DELAYED_RELEASE_TABLET | Freq: Every day | ORAL | Status: DC
  Administered 2022-06-23 – 2022-06-24 (×2): 81 mg via ORAL
  Filled 2022-06-23 (×2): qty 1

## 2022-06-23 MED ORDER — LOPERAMIDE HCL 2 MG PO CAPS
4.0000 mg | ORAL_CAPSULE | Freq: Three times a day (TID) | ORAL | Status: DC | PRN
Start: 2022-06-23 — End: 2022-06-24
  Administered 2022-06-23 – 2022-06-24 (×2): 4 mg via ORAL
  Filled 2022-06-23 (×2): qty 2

## 2022-06-23 MED ORDER — FENTANYL CITRATE PF 50 MCG/ML IJ SOSY
12.5000 ug | PREFILLED_SYRINGE | INTRAMUSCULAR | Status: AC | PRN
  Administered 2022-06-23 (×2): 12.5 ug via INTRAVENOUS
  Filled 2022-06-23 (×2): qty 1

## 2022-06-23 MED ORDER — ACETAMINOPHEN 650 MG RE SUPP: 650.0000 mg | Freq: Four times a day (QID) | RECTAL | Status: DC | PRN

## 2022-06-23 MED ORDER — IOHEXOL 9 MG/ML PO SOLN
500.0000 mL | ORAL | Status: AC
  Administered 2022-06-23 (×2): 500 mL via ORAL

## 2022-06-23 MED ORDER — LORATADINE 10 MG PO TABS
10.0000 mg | ORAL_TABLET | Freq: Every day | ORAL | Status: DC | PRN
Start: 2022-06-23 — End: 2022-06-24

## 2022-06-23 MED ORDER — FENTANYL CITRATE PF 50 MCG/ML IJ SOSY: 25.0000 ug | PREFILLED_SYRINGE | INTRAMUSCULAR | Status: DC | PRN

## 2022-06-23 MED ORDER — LACTATED RINGERS IV SOLN: INTRAVENOUS | Status: AC

## 2022-06-23 MED ORDER — IOHEXOL 300 MG/ML  SOLN
100.0000 mL | Freq: Once | INTRAMUSCULAR | Status: AC | PRN
  Administered 2022-06-23: 100 mL via INTRAVENOUS

## 2022-06-23 MED ORDER — PSEUDOEPHEDRINE HCL ER 120 MG PO TB12
120.0000 mg | ORAL_TABLET | Freq: Every day | ORAL | Status: DC | PRN
Start: 2022-06-23 — End: 2022-06-24

## 2022-06-23 MED ORDER — ONDANSETRON HCL 4 MG/2ML IJ SOLN
4.0000 mg | Freq: Four times a day (QID) | INTRAMUSCULAR | Status: DC | PRN
  Administered 2022-06-23: 4 mg via INTRAVENOUS
  Filled 2022-06-23: qty 2

## 2022-06-23 MED ORDER — FENTANYL CITRATE PF 50 MCG/ML IJ SOSY
12.5000 ug | PREFILLED_SYRINGE | Freq: Once | INTRAMUSCULAR | Status: AC
  Administered 2022-06-23: 12.5 ug via INTRAVENOUS
  Filled 2022-06-23: qty 1

## 2022-06-23 MED ORDER — ALBUTEROL SULFATE (2.5 MG/3ML) 0.083% IN NEBU: 2.5000 mg | INHALATION_SOLUTION | Freq: Four times a day (QID) | RESPIRATORY_TRACT | Status: DC | PRN

## 2022-06-23 MED ORDER — ONDANSETRON HCL 4 MG PO TABS: 4.0000 mg | ORAL_TABLET | Freq: Four times a day (QID) | ORAL | Status: DC | PRN

## 2022-06-23 MED ORDER — HYDROCODONE-ACETAMINOPHEN 5-325 MG PO TABS
1.0000 | ORAL_TABLET | Freq: Four times a day (QID) | ORAL | Status: DC | PRN
  Administered 2022-06-23 – 2022-06-24 (×4): 1 via ORAL
  Filled 2022-06-23 (×5): qty 1

## 2022-06-23 MED ORDER — SIMVASTATIN 20 MG PO TABS
20.0000 mg | ORAL_TABLET | Freq: Every day | ORAL | Status: DC
  Administered 2022-06-23: 20 mg via ORAL
  Filled 2022-06-23: qty 1

## 2022-06-23 MED ORDER — POTASSIUM CHLORIDE CRYS ER 20 MEQ PO TBCR
40.0000 meq | EXTENDED_RELEASE_TABLET | ORAL | Status: AC
Start: 2022-06-23 — End: 2022-06-23
  Administered 2022-06-23: 40 meq via ORAL
  Filled 2022-06-23: qty 2

## 2022-06-23 MED ORDER — POTASSIUM CHLORIDE CRYS ER 20 MEQ PO TBCR
40.0000 meq | EXTENDED_RELEASE_TABLET | ORAL | Status: DC
Start: 2022-06-23 — End: 2022-06-23
  Filled 2022-06-23: qty 2

## 2022-06-23 MED ORDER — LEVOTHYROXINE SODIUM 100 MCG PO TABS
125.0000 ug | ORAL_TABLET | Freq: Every day | ORAL | Status: DC
  Administered 2022-06-24: 125 ug via ORAL
  Filled 2022-06-23: qty 1

## 2022-06-23 MED ORDER — ENOXAPARIN SODIUM 40 MG/0.4ML IJ SOSY
40.0000 mg | PREFILLED_SYRINGE | INTRAMUSCULAR | Status: DC
Start: 2022-06-23 — End: 2022-06-24

## 2022-06-23 MED ORDER — SACCHAROMYCES BOULARDII 250 MG PO CAPS
250.0000 mg | ORAL_CAPSULE | Freq: Two times a day (BID) | ORAL | Status: DC
  Administered 2022-06-23 – 2022-06-24 (×3): 250 mg via ORAL
  Filled 2022-06-23 (×4): qty 1

## 2022-06-23 NOTE — ED Notes (Signed)
Currently eating breakfast.

## 2022-06-23 NOTE — ED Notes (Signed)
Debbie Houston moved to room c/o pain to right hand. Pt has movement movement to the right hand and some swelling to the wrist and fingers of the right wrist.

## 2022-06-23 NOTE — Consult Note (Addendum)
Debbie Houston is an 81 y.o. female.   Chief Complaint: wrist pain HPI: 81 yo female present with friend states she has pain in bilateral wrists, right greater than left.  Has been present for 2 days.  Does not note any injury to wrist.  Describes as being like labor pains that come and go.  Admitted to hospitalist service for possible infection.  Allergies:  Allergies  Allergen Reactions   Fosamax [Alendronate Sodium] Other (See Comments)    GI upset    Singulair [Montelukast Sodium] Other (See Comments)    Makes pt jittery   Wellbutrin [Bupropion] Hives   Clindamycin/Lincomycin Rash   Sulfa Antibiotics Rash    Past Medical History:  Diagnosis Date   Arthritis    Benign labile hypertension    Cancer ([place])    skin cancer on face   Colitis    DJD (degenerative joint disease)    Endometrial polyp    GERD (gastroesophageal reflux disease)    History of basal cell carcinoma excision    History of esophagitis    HOH (hard of hearing)    right ear better than left  -- wears no aides   Hyperlipidemia    Hypothyroidism    Neuromuscular disorder ([place])    neuropathy in feet   Osteopenia    Prediabetes    S/P right hip fracture 08/17/2020   Vitamin D deficiency    Wears glasses     Past Surgical History:  Procedure Laterality Date   BACK SURGERY  2020   CATARACT EXTRACTION, BILATERAL Bilateral 2020   CONVERSION TO TOTAL HIP Right 01/22/2022   Procedure: CONVERSION TO TOTAL HIP;  Surgeon: Willaim Sheng, MD;  Location: [facility];  Service: Orthopedics;  Laterality: Right;   DILATATION & CURETTAGE/HYSTEROSCOPY WITH MYOSURE N/A 10/27/2014   Procedure: DILATATION & CURETTAGE/HYSTEROSCOPY WITH MYOSURE;  Surgeon: Darlyn Chamber, MD;  Location: [facility];  Service: Gynecology;  Laterality: N/A;   DILATION AND CURETTAGE OF UTERUS     HIP SURGERY Right 04/2020   rod and screw in right hip    MOHS SURGERY  2005   LEFT NASAL BRIDGE FOR BASAL CELL   TONSILLECTOMY  age 51     Family History: Family History  Problem Relation Age of Onset   Hypertension Mother    Cancer Mother        COLON   Hyperlipidemia Mother    Cancer Father        BREAST WITH BRAIN METS   Hyperlipidemia Father    Cancer Sister        COLON (TEENS), BREAST (31)   Hyperlipidemia Sister    Diabetes Brother    Hyperlipidemia Brother    Hypertension Brother    Colon cancer Brother 48   Cancer Maternal Grandmother        RENAL   Hyperlipidemia Maternal Grandmother    Diabetes Maternal Grandfather    Stroke Maternal Grandfather    Hyperlipidemia Maternal Grandfather    Hyperlipidemia Paternal Grandmother    Hyperlipidemia Paternal Grandfather    Autism Son    Colitis Son    Autism spectrum disorder Son    Healthy Son     Social History:   reports that she quit smoking about 21 years ago. Her smoking use included cigarettes. She has a 3.75 pack-year smoking history. She has never been exposed to tobacco smoke. She has never used smokeless tobacco. She reports that she does not currently use alcohol. She  reports that she does not use drugs.  Medications: Facility-Administered Medications Prior to Admission  Medication Dose Route Frequency Provider Last Rate Last Admin   inFLIXimab (REMICADE) 4.02 mg/kg = 200 mg in sodium chloride 0.9 % 250 mL infusion  4.02 mg/kg Intravenous Once Unk Pinto, MD       Medications Prior to Admission  Medication Sig Dispense Refill   acetaminophen (TYLENOL) 500 MG tablet Take 500 mg by mouth at bedtime as needed for moderate pain or headache.      Ascorbic Acid (VITAMIN C) 1000 MG tablet Take 1,000 mg by mouth daily.      aspirin EC 81 MG tablet Take 1 tablet (81 mg total) by mouth in the morning and at bedtime. (Patient taking differently: Take 81 mg by mouth daily.) 60 tablet 0   bisoprolol (ZEBETA) 5 MG tablet TAKE 1/2 TABLET BY MOUTH EVERY DAY IN THE EVENING FOR BLOOD PRESSURE (Patient taking differently: Take 2.5 mg by mouth daily.) 45  tablet 3   Calcium Carbonate-Vit D-Min (CALCIUM 1200 PO) Take 1,200 mg by mouth daily.     CINNAMON PO Take 2,000 mg by mouth daily.     Coenzyme Q10 (COQ10) 200 MG CAPS Take 200 mg by mouth daily.     colestipol (COLESTID) 1 g tablet Take 4 g by mouth daily.     cycloSPORINE (RESTASIS) 0.05 % ophthalmic emulsion Place 1 drop into both eyes 2 (two) times daily.     levothyroxine (SYNTHROID) 125 MCG tablet TAKE 1 TABLET BY MOUTH DAILY ON AN EMPTY STOMACH WITH ONLY WATER FOR 30 MINUTES(NO ANTACIDS MEDICATIONS, CALCIUM OR MAGNESIUM FOR 4 HOURS) (Patient taking differently: Take 125 mcg by mouth daily before breakfast.) 90 tablet 0   loperamide (IMODIUM) 2 MG capsule Take 4 mg by mouth 2 (two) times daily.     loratadine (CLARITIN) 10 MG tablet Take 10 mg by mouth daily as needed for allergies.     Multiple Vitamin (THERA VITAMIN PO) Take 1 tablet by mouth daily.     Omega-3 Fatty Acids (FISH OIL) 1200 MG CAPS Take 1,200 mg by mouth daily.      pantoprazole (PROTONIX) 40 MG tablet Take 1 tablet  2 x /day  for Heartburn & Indigestion (Patient taking differently: Take 40 mg by mouth 2 (two) times daily.) 180 tablet 0   Probiotic Product (PROBIOTIC PO) Take 1 capsule by mouth daily.     PROLIA 60 MG/ML SOSY injection Inject 60 mg into the skin every 6 (six) months.     pseudoephedrine (SUDAFED) 120 MG 12 hr tablet Take  1 tablet  2 x /day (every 12 hours)  for Head and Chest Congestion (Patient taking differently: Take 120 mg by mouth daily as needed for congestion (Head and chest congestion).) 60 tablet 0   simvastatin (ZOCOR) 20 MG tablet Take  1 tablet  at Bedtime  for Cholesterol                                                 /                        TAKE             BY MOUTH (Patient taking differently: Take 20 mg by mouth daily at 6 PM.) 90 tablet 3  Results for orders placed or performed during the hospital encounter of 06/22/22 (from the past 48 hour(s))  Lactic acid, plasma     Status: None    Collection Time: 06/22/22 11:05 PM  Result Value Ref Range   Lactic Acid, Venous 1.6 0.5 - 1.9 mmol/L    Comment: Performed at [facility], [address], [place], [place] [address]  Comprehensive metabolic panel     Status: Abnormal   Collection Time: 06/22/22 11:05 PM  Result Value Ref Range   Sodium 136 135 - 145 mmol/L   Potassium 3.3 (L) 3.5 - 5.1 mmol/L   Chloride 103 98 - 111 mmol/L   CO2 22 22 - 32 mmol/L   Glucose, Bld 148 (H) 70 - 99 mg/dL    Comment: Glucose reference range applies only to samples taken after fasting for at least 8 hours.   BUN 9 8 - 23 mg/dL   Creatinine, Ser 0.86 0.44 - 1.00 mg/dL   Calcium 9.0 8.9 - 10.3 mg/dL   Total Protein 5.9 (L) 6.5 - 8.1 g/dL   Albumin 3.4 (L) 3.5 - 5.0 g/dL   AST 28 15 - 41 U/L   ALT 16 0 - 44 U/L   Alkaline Phosphatase 42 38 - 126 U/L   Total Bilirubin 0.6 0.3 - 1.2 mg/dL   GFR, Estimated >60 >60 mL/min    Comment: (NOTE) Calculated using the CKD-EPI Creatinine Equation (2021)    Anion gap 11 5 - 15    Comment: Performed at [facility], [place] [address], [place], [place] [address]  CBC with Differential     Status: Abnormal   Collection Time: 06/22/22 11:05 PM  Result Value Ref Range   WBC 6.4 4.0 - 10.5 K/uL   RBC 3.28 (L) 3.87 - 5.11 MIL/uL   Hemoglobin 10.2 (L) 12.0 - 15.0 g/dL   HCT 30.2 (L) 36.0 - 46.0 %   MCV 92.1 80.0 - 100.0 fL   MCH 31.1 26.0 - 34.0 pg   MCHC 33.8 30.0 - 36.0 g/dL   RDW 18.0 (H) 11.5 - 15.5 %   Platelets 138 (L) 150 - 400 K/uL   nRBC 0.0 0.0 - 0.2 %   Neutrophils Relative % 91 %   Neutro Abs 5.8 1.7 - 7.7 K/uL   Lymphocytes Relative 4 %   Lymphs Abs 0.3 (L) 0.7 - 4.0 K/uL   Monocytes Relative 2 %   Monocytes Absolute 0.1 0.1 - 1.0 K/uL   Eosinophils Relative 3 %   Eosinophils Absolute 0.2 0.0 - 0.5 K/uL   Basophils Relative 0 %   Basophils Absolute 0.0 0.0 - 0.1 K/uL   nRBC 0 0 /100 WBC   Abs Immature Granulocytes 0.00 0.00 - 0.07 K/uL    Comment: Performed at [facility], [place] [address] [place], [place] [address]  Protime-INR     Status: None   Collection Time: 06/22/22 11:05 PM  Result Value Ref Range   Prothrombin Time 14.4 11.4 - 15.2 seconds   INR 1.1 0.8 - 1.2    Comment: (NOTE) INR goal varies based on device and disease states. Performed at [facility], [place] [address], [place], [place] [address]   APTT     Status: None   Collection Time: 06/22/22 11:05 PM  Result Value Ref Range   aPTT 28 24 - 36 seconds    Comment: Performed at [place],  [place] [address]  Uric acid     Status: None   Collection Time: 06/22/22 11:05 PM  Result Value Ref Range   Uric Acid, Serum 5.5 2.5 - 7.1 mg/dL    Comment: Performed at [facility] [address], [place], [place] [address]  Blood Culture (routine x 2)     Status: None (Preliminary result)   Collection Time: 06/22/22 11:08 PM   Specimen: BLOOD RIGHT FOREARM  Result Value Ref Range   Specimen Description BLOOD RIGHT FOREARM    Special Requests      BOTTLES DRAWN AEROBIC AND ANAEROBIC Blood Culture results may not be optimal due to an excessive volume of blood received in culture bottles   Culture      NO GROWTH < 12 HOURS Performed at [facility] [address], [place], [place] [address]    Report Status PENDING   Blood Culture (routine x 2)     Status: None (Preliminary result)   Collection Time: 06/22/22 11:10 PM   Specimen: BLOOD  Result Value Ref Range   Specimen Description BLOOD RIGHT ANTECUBITAL    Special Requests      BOTTLES DRAWN AEROBIC AND ANAEROBIC Blood Culture results may not be optimal due to an excessive volume of blood received in culture bottles   Culture      NO GROWTH < 12 HOURS Performed at [facility] [address], [place], [place] [address]    Report Status PENDING   Resp Panel by RT-PCR (Flu A&B, Covid) Anterior Nasal Swab     Status: None   Collection Time: 06/22/22 11:41 PM   Specimen:  Anterior Nasal Swab  Result Value Ref Range   SARS Coronavirus 2 by RT PCR NEGATIVE NEGATIVE    Comment: (NOTE) SARS-CoV-2 target nucleic acids are NOT DETECTED.  The SARS-CoV-2 RNA is generally detectable in upper respiratory specimens during the acute phase of infection. The lowest concentration of SARS-CoV-2 viral copies this assay can detect is 138 copies/mL. A negative result does not preclude SARS-Cov-2 infection and should not be used as the sole basis for treatment or other patient management decisions. A negative result may occur with  improper specimen collection/handling, submission of specimen other than nasopharyngeal swab, presence of viral mutation(s) within the areas targeted by this assay, and inadequate number of viral copies(<138 copies/mL). A negative result must be combined with clinical observations, patient history, and epidemiological information. The expected result is Negative.  Fact Sheet for Patients:  EntrepreneurPulse.com.au  Fact Sheet for Healthcare Providers:  IncredibleEmployment.be  This test is no t yet approved or cleared by the [place] FDA and  has been authorized for detection and/or diagnosis of SARS-CoV-2 by FDA under an Emergency Use Authorization (EUA). This EUA will remain  in effect (meaning this test can be used) for the duration of the COVID-19 declaration under Section 564(b)(1) of the Act, 21 U.S.C.section 360bbb-3(b)(1), unless the authorization is terminated  or revoked sooner.       Influenza A by PCR NEGATIVE NEGATIVE   Influenza B by PCR NEGATIVE NEGATIVE    Comment: (NOTE) The Xpert Xpress SARS-CoV-2/FLU/RSV plus assay is intended as an aid in the diagnosis of influenza from Nasopharyngeal swab specimens and should not be used as a sole basis for treatment. Nasal washings and aspirates are unacceptable for Xpert Xpress SARS-CoV-2/FLU/RSV testing.  Fact Sheet for  Patients: EntrepreneurPulse.com.au  Fact Sheet for Healthcare Providers: IncredibleEmployment.be  This test is not yet approved  or cleared by the [place] and has been authorized for detection and/or diagnosis of SARS-CoV-2 by FDA under an Emergency Use Authorization (EUA). This EUA will remain in effect (meaning this test can be used) for the duration of the COVID-19 declaration under Section 564(b)(1) of the Act, 21 U.S.C. section 360bbb-3(b)(1), unless the authorization is terminated or revoked.  Performed at [facility], [place] [address], [place], [place] [address]   Urinalysis, Routine w reflex microscopic     Status: Abnormal   Collection Time: 06/23/22 12:15 AM  Result Value Ref Range   Color, Urine STRAW (A) YELLOW   APPearance CLEAR CLEAR   Specific Gravity, Urine 1.008 1.005 - 1.030   pH 7.0 5.0 - 8.0   Glucose, UA NEGATIVE NEGATIVE mg/dL   Hgb urine dipstick NEGATIVE NEGATIVE   Bilirubin Urine NEGATIVE NEGATIVE   Ketones, ur NEGATIVE NEGATIVE mg/dL   Protein, ur NEGATIVE NEGATIVE mg/dL   Nitrite NEGATIVE NEGATIVE   Leukocytes,Ua NEGATIVE NEGATIVE    Comment: Performed at [facility] [address] [place], [place] [address]  Lactic acid, plasma     Status: None   Collection Time: 06/23/22  3:20 AM  Result Value Ref Range   Lactic Acid, Venous 1.4 0.5 - 1.9 mmol/L    Comment: Performed at [facility] [address], [place], [place] [address]  Sedimentation rate     Status: Abnormal   Collection Time: 06/23/22  3:20 AM  Result Value Ref Range   Sed Rate 35 (H) 0 - 22 mm/hr    Comment: Performed at [facility] [address] [place], [place] [address]  C-reactive protein     Status: Abnormal   Collection Time: 06/23/22  3:20 AM  Result Value Ref Range   CRP 2.1 (H) <1.0 mg/dL    Comment: Performed at [facility], [place] [address], [place], [place] [address]  C Difficile Quick Screen  w PCR reflex     Status: None   Collection Time: 06/23/22 10:34 AM   Specimen: Stool  Result Value Ref Range   C Diff antigen NEGATIVE NEGATIVE   C Diff toxin NEGATIVE NEGATIVE   C Diff interpretation No C. difficile detected.     Comment: Performed at [facility], [place] [address] [place], [place] [address]    DG Wrist Complete Right  Result Date: 06/23/2022 CLINICAL DATA:  Right wrist pain and swelling. EXAM: RIGHT WRIST - COMPLETE 3+ VIEW COMPARISON:  None Available. FINDINGS: No evidence of acute fracture or dislocation. A bony density is seen at the ulnar styloid which may be related to old trauma. Severe degenerative changes are present at the first carpometacarpal joint. Mild degenerative changes are present at the first metacarpal phalangeal joint. There is calcification of the TFCC and articular cartilage at the radiocarpal joints. Soft tissue swelling is noted at the wrist. IMPRESSION: 1. No acute fracture or dislocation. 2. Degenerative changes at the wrist, first carpometacarpal and metacarpal phalangeal joints. Electronically Signed   By: Brett Fairy M.D.   On: 06/23/2022 01:25   DG Chest Port 1 View  Result Date: 06/22/2022 CLINICAL DATA:  Possible sepsis EXAM: PORTABLE CHEST 1 VIEW COMPARISON:  05/13/2022 FINDINGS: The heart size and mediastinal contours are within normal limits. Both lungs are clear. The visualized skeletal structures are unremarkable. IMPRESSION: No active disease. Electronically Signed   By: Inez Catalina M.D.   On: 06/22/2022 23:34  Blood pressure 129/68, pulse 80, temperature 98.3 F (36.8 C), temperature source Oral, resp. rate 18, height 5' 4"  (1.626 m), weight 49.9 kg, SpO2 99 %.  General appearance: alert, cooperative, and appears stated age Head: Normocephalic, without obvious abnormality, atraumatic Neck: supple, symmetrical, trachea midline Extremities: Intact sensation and capillary refill all digits.  +epl/fpl/io.  No wounds. Right  wrist with swelling over 4th dorsal compartment tendons just distal to joint.  Tender to palpation in this area.  The swelling moves with motion of the digits.  Non tender at cmc joint, anatomic snuffbox, radiocarpal joint line and ulnocarpal joint line.  No volar tenderness.  No pain with passive motion of wrist.  Minimal rubor and warmth.  No proximal streaking.  Left wrist non tender to palpation.  Good range of motion. Pulses: 2+ and symmetric Skin: Skin color, texture, turgor normal. No rashes or lesions Neurologic: Grossly normal Incision/Wound: none  Assessment/Plan Right wrist extensor tenosynovitis.  Afebrile, normal WBC.  Does not appear septic joint or infectious tenosynovitis at this time.  Recommend wrist splint if IV in right forearm can be discontinued.  Antiinflammatories if able to take, otherwise tylenol.  Patient wishes to avoid steroids.  Warm or cool compresses okay if they provide symptomatic relief.  Leanora Cover 06/23/2022, 3:18 PM

## 2022-06-23 NOTE — ED Notes (Signed)
Given applesauce to eat.

## 2022-06-23 NOTE — H&P (Addendum)
History and Physical    Patient: Debbie Houston GBM:[id number] DOB: 02-11-1941 DOA: 06/22/2022 DOS: the patient was seen and examined on 06/23/2022 PCP: Unk Pinto, MD  Patient coming from: Home with family via EMS  Chief Complaint:  Chief Complaint  Patient presents with   Fever   Altered Mental Status   HPI: [name] is a 81 y.o. female with medical history significant of hypertension, hyperlipidemia, hypothyroidism, microscopic colitis on Remicade, prediabetes, hard of hearing, and GERD who presents with complaints of fever and weakness.  She had gone to her primary care provider for her annual screening and prevention visit on 7/31.  Due to an abnormal white blood cell count and urinalysis have been obtained which was noted to be abnormal.  Her primary had sent off a urine culture which revealed revealed E. coli which was resistant to a Augmentin and ampicillin.  She had been prescribed ciprofloxacin 250 mg twice daily and had started it yesterday.  Prior to the patient starting the ciprofloxacin and being told that she had a UTI she had gone for her infusion of Remicade earlier that day.  Over the last month or so patient reports that she has been having 4-5 bowel movements per day which was unusual for her and in the last few weeks she weeks had been having incontinence of her bowels.  She had been taking Imodium up to 3 times daily along with her colstipol in an effort to try and control symptoms.  She had recently had a calprotectin stool level obtained 2 weeks ago which was noted to be 114.  The patient also reports that she has had abdominal pain mostly in the right upper quadrant in addition to the diarrhea.  Denied having any cough, chest pain, vomiting, dysuria, recent antibiotics, recent sick contacts, or blood in stool/urine.  Sometime after 5 PM yesterday she began to have joint pains in both arms and legs.  It was reported that she cannot is although appears at her baseline at  this time.  She was noted to be warm to the touch.  Patient was so weak to the point where she was unable to ambulate, and normally gets around without need of assistance.  She also reported acute swelling and pain of her right wrist that previously was not present.  Denies having any injury or trauma to the right wrist.  She had received the IV infusion of Remicade on the right arm, but IV was placed in the antecubital fossa.  In route with EMS patient was noted to be tachycardic and tachypneic with all other vital signs maintained.  Given acetaminophen 650 mg p.o. and 100 mL of normal saline IV fluids.  Upon admission into the emergency department patient was noted to be febrile up to 103 F, pulse 119, respirations 21, all other vital signs relatively maintained.  Labs noted WBC 6.4, hemoglobin 10.2, platelets 138, potassium 3.3, glucose 148, lactic acid 1.6-> 1.4, CRP 2.1, and ESR 35.  Chest x-ray was unremarkable.  Influenza and COVID-19 screening was negative.  Urinalysis was unremarkable.  Blood and urine cultures were obtained.  X-rays of the right wrist showed no acute abnormality with degenerative change.  Patient has been given 1 L lactated Ringer's, 100 mg of ibuprofen p.o., fentanyl 12.5 mcg IV, and Rocephin 2 g IV.  Case had been discussed with Dr. Fredna Dow of hand surgery who reviewed imaging's and recommended continuation of antibiotics.  Review of Systems: As mentioned in the history of  present illness. All other systems reviewed and are negative. Past Medical History:  Diagnosis Date   Arthritis    Benign labile hypertension    Cancer (HCC)    skin cancer on face   Colitis    DJD (degenerative joint disease)    Endometrial polyp    GERD (gastroesophageal reflux disease)    History of basal cell carcinoma excision    History of esophagitis    HOH (hard of hearing)    right ear better than left  -- wears no aides   Hyperlipidemia    Hypothyroidism    Neuromuscular disorder ([place])     neuropathy in feet   Osteopenia    Prediabetes    S/P right hip fracture 08/17/2020   Vitamin D deficiency    Wears glasses    Past Surgical History:  Procedure Laterality Date   BACK SURGERY  2020   CATARACT EXTRACTION, BILATERAL Bilateral 2020   CONVERSION TO TOTAL HIP Right 01/22/2022   Procedure: CONVERSION TO TOTAL HIP;  Surgeon: Willaim Sheng, MD;  Location: [facility];  Service: Orthopedics;  Laterality: Right;   DILATATION & CURETTAGE/HYSTEROSCOPY WITH MYOSURE N/A 10/27/2014   Procedure: DILATATION & CURETTAGE/HYSTEROSCOPY WITH MYOSURE;  Surgeon: Darlyn Chamber, MD;  Location: [facility];  Service: Gynecology;  Laterality: N/A;   DILATION AND CURETTAGE OF UTERUS     HIP SURGERY Right 04/2020   rod and screw in right hip    MOHS SURGERY  2005   LEFT NASAL BRIDGE FOR BASAL CELL   TONSILLECTOMY  age 65   Social History:  reports that she quit smoking about 21 years ago. Her smoking use included cigarettes. She has a 3.75 pack-year smoking history. She has never been exposed to tobacco smoke. She has never used smokeless tobacco. She reports that she does not currently use alcohol. She reports that she does not use drugs.  Allergies  Allergen Reactions   Fosamax [Alendronate Sodium] Other (See Comments)    GI upset    Singulair [Montelukast Sodium] Other (See Comments)    Makes pt jittery   Wellbutrin [Bupropion] Hives   Clindamycin/Lincomycin Rash   Sulfa Antibiotics Rash    Family History  Problem Relation Age of Onset   Hypertension Mother    Cancer Mother        COLON   Hyperlipidemia Mother    Cancer Father        BREAST WITH BRAIN METS   Hyperlipidemia Father    Cancer Sister        COLON (TEENS), BREAST (52)   Hyperlipidemia Sister    Diabetes Brother    Hyperlipidemia Brother    Hypertension Brother    Colon cancer Brother 4   Cancer Maternal Grandmother        RENAL   Hyperlipidemia Maternal Grandmother    Diabetes Maternal  Grandfather    Stroke Maternal Grandfather    Hyperlipidemia Maternal Grandfather    Hyperlipidemia Paternal Grandmother    Hyperlipidemia Paternal Grandfather    Autism Son    Colitis Son    Autism spectrum disorder Son    Healthy Son     Prior to Admission medications   Medication Sig Start Date End Date Taking? Authorizing Provider  acetaminophen (TYLENOL) 500 MG tablet Take 500 mg by mouth at bedtime as needed for moderate pain or headache.     [provider]  Ascorbic Acid (VITAMIN C) 1000 MG tablet Take 1,000 mg by mouth daily.  [provider]  aspirin EC 81 MG tablet Take 1 tablet (81 mg total) by mouth in the morning and at bedtime. 01/23/22   Willaim Sheng, MD  bisoprolol (ZEBETA) 5 MG tablet TAKE 1/2 TABLET BY MOUTH EVERY DAY IN THE EVENING FOR BLOOD PRESSURE 08/09/21   Alycia Rossetti, NP  Calcium Carbonate-Vit D-Min (CALCIUM 1200 PO) Take 1,200 mg by mouth daily.    [provider]  cetirizine (ZYRTEC) 10 MG tablet Take 10 mg by mouth at bedtime.    [provider]  CINNAMON PO Take 2,000 mg by mouth daily.    [provider]  ciprofloxacin (CIPRO) 250 MG tablet Take  1 tablet  2 x /day  with Food for UTI 06/22/22   Unk Pinto, MD  Coenzyme Q10 (COQ10) 200 MG CAPS Take 200 mg by mouth daily.    [provider]  colestipol (COLESTID) 1 g tablet Take 4 g by mouth daily. 07/26/20   [provider]  cycloSPORINE (RESTASIS) 0.05 % ophthalmic emulsion Place 1 drop into both eyes 2 (two) times daily.    [provider]  levothyroxine (SYNTHROID) 125 MCG tablet TAKE 1 TABLET BY MOUTH DAILY ON AN EMPTY STOMACH WITH ONLY WATER FOR 30 MINUTES(NO ANTACIDS MEDICATIONS, CALCIUM OR MAGNESIUM FOR 4 HOURS) Patient taking differently: Take 125 mcg by mouth every other day. 12/18/21   Alycia Rossetti, NP  loratadine (CLARITIN) 10 MG tablet Take 10 mg by mouth daily as needed for allergies.    [provider]  Multiple Vitamin (THERA VITAMIN PO) Take 1 tablet by mouth daily.    [provider]  mupirocin ointment (BACTROBAN) 2 % Apply 1 application. topically 2 (two) times daily. 04/23/22   Alycia Rossetti, NP  Omega-3 Fatty Acids (FISH OIL) 1200 MG CAPS Take 1,200 mg by mouth daily.     [provider]  pantoprazole (PROTONIX) 40 MG tablet Take 1 tablet  2 x /day  for Heartburn & Indigestion 03/08/20   Unk Pinto, MD  Probiotic Product (PROBIOTIC PO) Take 1 capsule by mouth daily.    [provider]  PROLIA 60 MG/ML SOSY injection Inject 60 mg into the skin every 6 (six) months. 01/28/20   [provider]  pseudoephedrine (SUDAFED) 120 MG 12 hr tablet Take  1 tablet  2 x /day (every 12 hours)  for Head and Chest Congestion 11/15/21   Unk Pinto, MD  simvastatin (ZOCOR) 20 MG tablet Take  1 tablet  at Bedtime  for Cholesterol                                                 /                        TAKE             BY MOUTH 01/27/22   Unk Pinto, MD    Physical Exam: Vitals:   06/23/22 0415 06/23/22 0445 06/23/22 0600 06/23/22 0700  BP: 132/78 111/72 116/78 117/80  Pulse: 98 92 96 86  Resp: 19 18 18 15   Temp:      TempSrc:      SpO2: 97% (!) 88% 93% 91%  Weight:      Height:       Constitutional: Elderly female who  appears to be some discomfort Eyes: PERRL, lids and conjunctivae normal ENMT: Mucous membranes are moist.  .  Neck: normal, supple  Respiratory: clear to auscultation bilaterally, no wheezing, no crackles. Normal respiratory effort. No accessory muscle use.  Cardiovascular: Regular rate and rhythm, no murmurs / rubs / gallops. No lower extremity edema.   Abdomen: Tenderness to palpation appreciated most in the right upper quadrant of the abdomen.  Bowel sounds present all 4 quadrants. Musculoskeletal: no clubbing / cyanosis.  Right wrist swelling present.  Patient able to move right wrist, but does endorse some  pain. Skin: Erythema present of the right wrist. Neurologic: CN 2-12 grossly intact. Sensation intact, DTR normal. Strength 5/5 in all 4.  Psychiatric: Normal judgment and insight. Alert and oriented x 3. Normal mood.   Data Reviewed:    Assessment and Plan:  SIRS cellulitis of the right wrist Plan admission to the emergency department patient was noted to be febrile up to 103 F with tachycardia meeting SIRS criteria.  WBC was 6.4 with lactic acid within normal limits. Initial source thought to be urine, but urine appears to be noninfected at this time.  Influenza and COVID-19 screening were negative.  She was complaining of pain in her right wrist to have redness and erythema present there ESR was 35 with CRP 2.9.  Patient was noted to have full range of motion of the wrist making septic joint seem less likely.  Case had been discussed with Dr. Fredna Dow of hand surgery over the phone who reviewed imaging, and recommended continuation of antibiotics.  Blood and cultures had been obtained.  She was started on empiric antibiotics of Rocephin.  Currently question possibility of cellulitis of the right wrist as a cause of symptoms.  On the differential includes reaction from Remicade infusion. -Admit to a medical telemetry bed -Follow-up blood and urine cultures -Add on respiratory virus panel -Add on uric acid -Continue empiric antibiotics of Rocephin -Pain medications as needed for -Acetaminophen as needed for fever  Acute metabolic encephalopathy It was noted that around 5 PM yesterday patient began to get confused and very weak and unable to get up and was warm to the touch.  Patient had only taken 1 dose of ciprofloxacin prior to onset of symptoms.  She appears to be now back to baseline. -Neurochecks  Inflammatory Bowel disease diarrhea (acute) She reports that she has been having 4-5 bowel movements per day on average over the last month and has started having stool incontinence.   Patient was noted to have right upper quadrant abdominal pain on physical exam patient is on Remicade and followed by [facility] gastroenterology in the outpatient setting previous diagnosis of microscopic colitis is listed.   She had recently had a stool calprotectin level done on 7/20 which was within normal limits.  Also ESR and CRP were within normal limits on 7/31. She just received her Remicade infusion yesterday prior to onset of symptoms.  -Monitor intake and output -Check C. difficile and GI panel -Check CT scan of the abdomen pelvis with contrast -Case had been discussed with [facility] over the phone, but no other recommendations at this time.  Consider formally consulting if needed once more information is obtained.  Hypokalemia Acute.  Initial potassium 3.3.  Likely related to diarrhea patient recently has been having. -Give 40 mEq of potassium chloride p.o.  -Continue to monitor and replace as needed  Generalized weakness Acute.  Patient was noted to be acutely weak for which she  was unable to get up.  Normally patient ambulates without assistance. -PT/OT to evaluate and treat  Thrombocytopenia Acute.  Platelet count noted to be as low as 138.  No reports of bleeding.  Possibly related to acute infusion given. -Recheck CBC tomorrow morning  UTI (ruled out) Patient reports that she never had any symptoms of dysuria.  Urine culture from 8/2 was positive for E. coli with 50,000- 100,000 needs for which she had been started on ciprofloxacin.  However patient only took 1 dose and repeat urinalysis did not show any concern for infection.  Hypothyroidism TSH noted to be 2.22 on 06/18/2022. -Continue levothyroxine  Normocytic anemia Chronic.  Hemoglobin 10.2 g/dL which appears around patient's baseline.  Patient noted to have elevated RDW, but iron studies noted to be relatively within normal limits earlier this year. -Continue to monitor  Essential hypertension Home medication regimen  includes bisoprolol 2.5 mg daily. -Continue bisoprolol as tolerated  Prediabetes On admission glucose 148.  Last hemoglobin A1c 5.7 on 06/18/2022.  She is not on any medication for treatment. -Continue outpatient follow-up  Hyperlipidemia -Continue simvastatin  GERD -Continue Protonix  DVT prophylaxis: Lovenox Advance Care Planning:   Code Status: Full Code   Consults: None Family Communication: Patient's sister updated at bedside  Severity of Illness: The appropriate patient status for this patient is INPATIENT. Inpatient status is judged to be reasonable and necessary in order to provide the required intensity of service to ensure the patient's safety. The patient's presenting symptoms, physical exam findings, and initial radiographic and laboratory data in the context of their chronic comorbidities is felt to place them at high risk for further clinical deterioration. Furthermore, it is not anticipated that the patient will be medically stable for discharge from the hospital within 2 midnights of admission.   * I certify that at the point of admission it is my clinical judgment that the patient will require inpatient hospital care spanning beyond 2 midnights from the point of admission due to high intensity of service, high risk for further deterioration and high frequency of surveillance required.*  Author: Norval Morton, MD 06/23/2022 7:19 AM  For on call review [email or web address].

## 2022-06-23 NOTE — Sepsis Progress Note (Signed)
Following for sepsis monitoring ?

## 2022-06-23 NOTE — Plan of Care (Signed)

## 2022-06-23 NOTE — Evaluation (Signed)
Occupational Therapy Evaluation Patient Details Name: Debbie Houston MRN: [id number] DOB: 1941/04/04 Today's Date: 06/23/2022   History of Present Illness 81 y.o. female with medical history significant of hypertension, hyperlipidemia, hypothyroidism, microscopic colitis on Remicade, prediabetes, hard of hearing, and GERD who presents with complaints of fever and weakness.   Clinical Impression   Patient admitted for the diagnosis above.  Current deficit is wrist pain and loose stools.  PTA she lives with her spouse, who has dementia, and her son.  The patient cares for her spouse, and needed no assist with mobility, ADL or iADL.  The patient and her sister endorse she is at her baseline for mentation, and she is up and moving well, needing generalized supervision for mobility in the halls.  No significant OT needs in the acute setting, and no post acute OT anticipated.        Recommendations for follow up therapy are one component of a multi-disciplinary discharge planning process, led by the attending physician.  Recommendations may be updated based on patient status, additional functional criteria and insurance authorization.   Follow Up Recommendations  Follow physician's recommendations for discharge plan and follow up therapies    Assistance Recommended at Discharge PRN  Patient can return home with the following      Functional Status Assessment  Patient has not had a recent decline in their functional status  Equipment Recommendations  None recommended by OT    Recommendations for Other Services  PT Consult     Precautions / Restrictions Precautions Precautions: Other (comment) Precaution Comments: Loose stool Restrictions Weight Bearing Restrictions: No      Mobility Bed Mobility Overal bed mobility: Modified Independent                  Transfers Overall transfer level: Modified independent                        Balance Overall balance  assessment: Mild deficits observed, not formally tested                                         ADL either performed or assessed with clinical judgement   ADL Overall ADL's : At baseline                                       General ADL Comments: currenlty needing assist with buttons and snaps due to pain in her wrists impacting grip     Vision Baseline Vision/History: 1 Wears glasses Patient Visual Report: No change from baseline       Perception Perception Perception: Within Functional Limits   Praxis Praxis Praxis: Intact    Pertinent Vitals/Pain Pain Assessment Pain Assessment: Faces Faces Pain Scale: Hurts little more Pain Location: R wrist Pain Descriptors / Indicators: Sharp Pain Intervention(s): Monitored during session     Hand Dominance Left   Extremity/Trunk Assessment Upper Extremity Assessment Upper Extremity Assessment: RUE deficits/detail;LUE deficits/detail RUE Deficits / Details: weak grip RUE Sensation: decreased light touch RUE Coordination: decreased fine motor LUE Deficits / Details: weak grip LUE Sensation: WNL LUE Coordination: decreased fine motor   Lower Extremity Assessment Lower Extremity Assessment: Defer to PT evaluation   Cervical / Trunk Assessment Cervical / Trunk Assessment: Normal   Communication Communication  Communication: No difficulties   Cognition Arousal/Alertness: Awake/alert Behavior During Therapy: WFL for tasks assessed/performed Overall Cognitive Status: Within Functional Limits for tasks assessed                                       General Comments   VSS on RA    Exercises     Shoulder Instructions      Home Living Family/patient expects to be discharged to:: Private residence Living Arrangements: Spouse/significant other;Children Available Help at Discharge: Family;Available 24 hours/day Type of Home: House Home Access: Stairs to enter [employer] of Steps: 2 Entrance Stairs-Rails: None Home Layout: One level     Bathroom Shower/Tub: Tub/shower unit;Walk-in shower   Bathroom Toilet: Standard Bathroom Accessibility: Yes How Accessible: Accessible via [place] Home Equipment: [place] (2 wheels);Cane - single point;BSC/3in1;Shower seat;Adaptive equipment Adaptive Equipment: Reacher Additional Comments: Care for spouse with dementia      Prior Functioning/Environment Prior Level of Function : Independent/Modified Independent;Driving                        OT Problem List: Pain      OT Treatment/Interventions:      OT Goals(Current goals can be found in the care plan section) Acute Rehab OT Goals Patient Stated Goal: Hoping to return home tomorrow OT Goal Formulation: With patient Time For Goal Achievement: 07/03/22 Potential to Achieve Goals: Fair  OT Frequency:      Co-evaluation              AM-PAC OT "6 Clicks" Daily Activity     Outcome Measure Help from another person eating meals?: None Help from another person taking care of personal grooming?: None Help from another person toileting, which includes using toliet, bedpan, or urinal?: None Help from another person bathing (including washing, rinsing, drying)?: None Help from another person to put on and taking off regular upper body clothing?: None Help from another person to put on and taking off regular lower body clothing?: None 6 Click Score: 24   End of Session Nurse Communication: Mobility status  Activity Tolerance: Patient tolerated treatment well Patient left: in bed;with call bell/phone within reach;with family/visitor present  OT Visit Diagnosis: Pain                Time: 1420-1440 OT Time Calculation (min): 20 min Charges:  OT General Charges $OT Visit: 1 Visit OT Evaluation $OT Eval Moderate Complexity: 1 Mod  06/23/2022  RP, OTR/L  Acute Rehabilitation Services  Office:  [phone]   Metta Clines 06/23/2022, 2:45 PM

## 2022-06-24 DIAGNOSIS — A419 Sepsis, unspecified organism: Secondary | ICD-10-CM | POA: Diagnosis present

## 2022-06-24 DIAGNOSIS — M65831 Other synovitis and tenosynovitis, right forearm: Secondary | ICD-10-CM

## 2022-06-24 DIAGNOSIS — E876 Hypokalemia: Secondary | ICD-10-CM | POA: Diagnosis not present

## 2022-06-24 DIAGNOSIS — G9341 Metabolic encephalopathy: Secondary | ICD-10-CM | POA: Diagnosis not present

## 2022-06-24 DIAGNOSIS — N390 Urinary tract infection, site not specified: Secondary | ICD-10-CM

## 2022-06-24 LAB — CBC
HCT: 26.8 % — ABNORMAL LOW (ref 36.0–46.0)
Hemoglobin: 8.9 g/dL — ABNORMAL LOW (ref 12.0–15.0)
MCH: 30.7 pg (ref 26.0–34.0)
MCHC: 33.2 g/dL (ref 30.0–36.0)
MCV: 92.4 fL (ref 80.0–100.0)
Platelets: 78 10*3/uL — ABNORMAL LOW (ref 150–400)
RBC: 2.9 MIL/uL — ABNORMAL LOW (ref 3.87–5.11)
RDW: 18.5 % — ABNORMAL HIGH (ref 11.5–15.5)
WBC: 6.4 10*3/uL (ref 4.0–10.5)
nRBC: 0 % (ref 0.0–0.2)

## 2022-06-24 LAB — BASIC METABOLIC PANEL
Anion gap: 9 (ref 5–15)
BUN: 11 mg/dL (ref 8–23)
CO2: 22 mmol/L (ref 22–32)
Calcium: 8.5 mg/dL — ABNORMAL LOW (ref 8.9–10.3)
Chloride: 103 mmol/L (ref 98–111)
Creatinine, Ser: 0.76 mg/dL (ref 0.44–1.00)
GFR, Estimated: >60 mL/min (ref >60)
Glucose, Bld: 98 mg/dL (ref 70–99)
Potassium: 3.2 mmol/L — ABNORMAL LOW (ref 3.5–5.1)
Sodium: 134 mmol/L — ABNORMAL LOW (ref 135–145)

## 2022-06-24 LAB — URINE CULTURE

## 2022-06-24 MED ORDER — POTASSIUM CHLORIDE CRYS ER 20 MEQ PO TBCR
40.0000 meq | EXTENDED_RELEASE_TABLET | Freq: Once | ORAL | Status: DC
Start: 2022-06-24 — End: 2022-06-24

## 2022-06-24 MED ORDER — CEFDINIR 300 MG PO CAPS
300.0000 mg | ORAL_CAPSULE | Freq: Two times a day (BID) | ORAL | Status: DC
  Filled 2022-06-24 (×2): qty 1

## 2022-06-24 MED ORDER — CEFDINIR 300 MG PO CAPS: 300.0000 mg | ORAL_CAPSULE | Freq: Two times a day (BID) | ORAL | 0 refills | Status: AC

## 2022-06-24 NOTE — Discharge Summary (Addendum)
Physician Discharge Summary   Patient: Debbie Houston MRN: [id number] DOB: [birth year]  Admit date:     06/22/2022  Discharge date: 06/24/22  Discharge Physician: Debbie Houston   PCP: Debbie Pinto, MD   Recommendations at discharge:    Patient will continue with Cefdinir for one more day to complete 5 days of effective antibiotic therapy for drug resistant E coli urine infection. Continue local hot or cold compresses to her right hand as needed.   Discharge Diagnoses: Principal Problem:   Extensor tenosynovitis of right wrist Active Problems:   Sepsis secondary to UTI ([place])   Acute metabolic encephalopathy   Hypokalemia   Inflammatory bowel disease   Thrombocytopenia (HCC)   Hypothyroid   Essential hypertension   GERD   Hyperlipidemia, mixed   Prediabetes  Resolved Problems:   * No resolved hospital problems. [facility] Course: [name] was admitted to the hospital with the working diagnosis of right wrist cellulitis.  81 yo female with the past medical history of hypertension, dyslipidemia, hypothyroidism, and colitis on Infliximab who presented with fever and altered mental status. On 07/31 she was diagnosed with urinary tract infection due to E coli resistant to Augmentin and ampicillin, she was treated with ciprofloxacin, first dose 06/22/22. About 12 hrs prior to admission she developed severe weakness, not able to ambulate, right wrist with edema and erythema. On her initial physical examination her temperature was 103, HR 119, RR 21 02 saturation 91%, lungs with no wheezing or rales, heart with S1 and S2 present and tachycardic, abdomen with tenderness to palpation right upper quadrant. Right wrist with erythema and edema.   Na 136, K 3,3 CL 103, bicarbonate 22, glucose 148 bun 9 cr 0,86  Wbc 6,4 hgb 10,2 plt 138   Ct abdomen and pelvis with no acute changes. Chest radiograph with no infiltrates, no cardiomegaly.   EKG 117, right bundle branch block,  qtc 510, sinus rhythm with no significant ST segment or T wave changes.   Right wrist radiograph with no acute fracture or dislocation.   Hand surgery evaluation, right wrist extensor tenosynovitis.   Plan to transition antibiotic therapy to oral and complete full course of 5 days, for drug resistant E coli urine infection.  Follow up as outpatient.       Assessment and Plan: * Extensor tenosynovitis of right wrist Right hand cellulitis has been ruled out. No significant edema or erythema,  Continue analgesics as needed.  Warm or cool compresses as needed.  Avoid flouroquinolones.   Sepsis secondary to UTI [facility]) UTI with drug resistant E coli, present on admission.  Patient had 4 days so fay of effective antibiotic therapy, will complete with one more day with cefdinir and plan to follow up as outpatient.   Acute metabolic encephalopathy Resolved, patient back to her baseline at the time of her discharge.   Hypokalemia Hyponatremia,  Renal function stable with serum cr at 0,76, K ids 3,2 and serum Na 134 Plan to add 40 meq Kcl before her discharge and follow up electrolytes as outpatient.   Inflammatory bowel disease Patient has been on infliximab as outpatient.  Plan to follow up before new infusion, possible arthritis related to infusion.  Diarrhea is improving.   Thrombocytopenia (HCC) Pancytopenia with hgb at 8,9, wbdc 6.4 and plt at 78. Anemia of chronic disease.  Plan to follow up as outpatient.   Hypothyroid Continue with levothyroxine   Essential hypertension Continue blood pressure monitoring   GERD Continue  ppi   Hyperlipidemia, mixed Patient on statin therapy.   Prediabetes Follow up as outpatient  Fasting glucose at the time of discharge is 98          Consultants: hand surgery  Procedures performed: none   Disposition: Home Diet recommendation:  Discharge Diet Orders (From admission, onward)     Start     Ordered   06/24/22 0000   Diet - low sodium heart healthy        06/24/22 1430           Cardiac diet DISCHARGE MEDICATION: Allergies as of 06/24/2022       Reactions   Fosamax [alendronate Sodium] Other (See Comments)   GI upset    Singulair [montelukast Sodium] Other (See Comments)   Makes pt jittery   Wellbutrin [bupropion] Hives   Clindamycin/lincomycin Rash   Sulfa Antibiotics Rash        Medication List     TAKE these medications    acetaminophen 500 MG tablet Commonly known as: TYLENOL Take 500 mg by mouth at bedtime as needed for moderate pain or headache.   aspirin EC 81 MG tablet Take 1 tablet (81 mg total) by mouth in the morning and at bedtime. What changed: when to take this   bisoprolol 5 MG tablet Commonly known as: ZEBETA TAKE 1/2 TABLET BY MOUTH EVERY DAY IN THE EVENING FOR BLOOD PRESSURE What changed: See the new instructions.   CALCIUM 1200 PO Take 1,200 mg by mouth daily.   cefdinir 300 MG capsule Commonly known as: OMNICEF Take 1 capsule (300 mg total) by mouth every 12 (twelve) hours for 1 day.   CINNAMON PO Take 2,000 mg by mouth daily.   colestipol 1 g tablet Commonly known as: COLESTID Take 4 g by mouth daily.   CoQ10 200 MG Caps Take 200 mg by mouth daily.   cycloSPORINE 0.05 % ophthalmic emulsion Commonly known as: RESTASIS Place 1 drop into both eyes 2 (two) times daily.   Fish Oil 1200 MG Caps Take 1,200 mg by mouth daily.   levothyroxine 125 MCG tablet Commonly known as: SYNTHROID TAKE 1 TABLET BY MOUTH DAILY ON AN EMPTY STOMACH WITH ONLY WATER FOR 30 MINUTES(NO ANTACIDS MEDICATIONS, CALCIUM OR MAGNESIUM FOR 4 HOURS) What changed:  how much to take how to take this when to take this additional instructions   loperamide 2 MG capsule Commonly known as: IMODIUM Take 4 mg by mouth 2 (two) times daily.   loratadine 10 MG tablet Commonly known as: CLARITIN Take 10 mg by mouth daily as needed for allergies.   pantoprazole 40 MG  tablet Commonly known as: Protonix Take 1 tablet  2 x /day  for Heartburn & Indigestion What changed:  how much to take how to take this when to take this additional instructions   PROBIOTIC PO Take 1 capsule by mouth daily.   Prolia 60 MG/ML Sosy injection Generic drug: denosumab Inject 60 mg into the skin every 6 (six) months.   pseudoephedrine 120 MG 12 hr tablet Commonly known as: SUDAFED Take  1 tablet  2 x /day (every 12 hours)  for Head and Chest Congestion What changed:  how much to take how to take this when to take this reasons to take this additional instructions   simvastatin 20 MG tablet Commonly known as: ZOCOR Take  1 tablet  at Bedtime  for Cholesterol                                                 /  TAKE             BY MOUTH What changed:  how much to take how to take this when to take this additional instructions   THERA VITAMIN PO Take 1 tablet by mouth daily.   vitamin C 1000 MG tablet Take 1,000 mg by mouth daily.        Discharge Exam: Filed Weights   06/22/22 2256  Weight: 49.9 kg   BP 116/85 (BP Location: Left Arm)   Pulse 60   Temp 98.4 F (36.9 C) (Oral)   Resp 16   Ht 5' 4"  (1.626 m)   Wt 49.9 kg   SpO2 98%   BMI 18.88 kg/m   Patient with no further hand pain, no confusion and mentation at her baseline, her sister is at the bedside  Neurology awake and alert ENT with no pallor Cardiovascular with S1 and S2 present and rhythmic Respiratory with no wheezing  Abdomen soft No lower extremity edema Right hand dorsum with no erythema or edema.     Condition at discharge: stable  The results of significant diagnostics from this hospitalization (including imaging, microbiology, ancillary and laboratory) are listed below for reference.   Imaging Studies: CT ABDOMEN PELVIS W CONTRAST  Result Date: 06/23/2022 CLINICAL DATA:  Acute abdominal pain EXAM: CT ABDOMEN AND PELVIS WITH CONTRAST TECHNIQUE:  Multidetector CT imaging of the abdomen and pelvis was performed using the standard protocol following bolus administration of intravenous contrast. RADIATION DOSE REDUCTION: This exam was performed according to the departmental dose-optimization program which includes automated exposure control, adjustment of the mA and/or kV according to patient size and/or use of iterative reconstruction technique. CONTRAST:  158m OMNIPAQUE IOHEXOL 300 MG/ML  SOLN COMPARISON:  08/06/2021 FINDINGS: Lower chest: Scattered hypoventilatory changes at the lung bases. No acute pleural or parenchymal lung disease. Hepatobiliary: Stable hepatic cysts. Otherwise no acute liver abnormality. The gallbladder is unremarkable. No biliary duct dilation. Pancreas: Unremarkable. No pancreatic ductal dilatation or surrounding inflammatory changes. Spleen: Normal in size without focal abnormality. Adrenals/Urinary Tract: There are bilateral nonobstructing renal calculi, measuring up to 4 mm on the right and 5 mm on the left. No obstructive uropathy. Normal bilateral renal parenchymal enhancement. The adrenals and bladder are grossly unremarkable. Stomach/Bowel: No bowel obstruction or ileus. No bowel wall thickening or inflammatory change. Vascular/Lymphatic: Aortic atherosclerosis. No enlarged abdominal or pelvic lymph nodes. Reproductive: Stable calcified uterine fibroids.  No adnexal masses. Other: No free fluid or free intraperitoneal gas. No abdominal wall hernia. Musculoskeletal: Unremarkable appearance of a right hip arthroplasty. Prior L4-5 posterior fusion as well as L3 through L5 vertebral augmentation. Reconstructed images demonstrate no additional findings. IMPRESSION: 1. No acute intra-abdominal or intrapelvic process. 2. Stable bilateral nonobstructing renal calculi. 3. Aortic Atherosclerosis (ICD10-I70.0) and Emphysema (ICD10-J43.9). Electronically Signed   By: MRanda NgoM.D.   On: 06/23/2022 16:39   DG Wrist Complete  Right  Result Date: 06/23/2022 CLINICAL DATA:  Right wrist pain and swelling. EXAM: RIGHT WRIST - COMPLETE 3+ VIEW COMPARISON:  None Available. FINDINGS: No evidence of acute fracture or dislocation. A bony density is seen at the ulnar styloid which may be related to old trauma. Severe degenerative changes are present at the first carpometacarpal joint. Mild degenerative changes are present at the first metacarpal phalangeal joint. There is calcification of the TFCC and articular cartilage at the radiocarpal joints. Soft tissue swelling is noted at the wrist. IMPRESSION: 1. No acute fracture or dislocation. 2. Degenerative changes at the  wrist, first carpometacarpal and metacarpal phalangeal joints. Electronically Signed   By: Brett Fairy M.D.   On: 06/23/2022 01:25   DG Chest Port 1 View  Result Date: 06/22/2022 CLINICAL DATA:  Possible sepsis EXAM: PORTABLE CHEST 1 VIEW COMPARISON:  05/13/2022 FINDINGS: The heart size and mediastinal contours are within normal limits. Both lungs are clear. The visualized skeletal structures are unremarkable. IMPRESSION: No active disease. Electronically Signed   By: Inez Catalina M.D.   On: 06/22/2022 23:34    Microbiology: Results for orders placed or performed during the hospital encounter of 06/22/22  Blood Culture (routine x 2)     Status: None (Preliminary result)   Collection Time: 06/22/22 11:08 PM   Specimen: BLOOD RIGHT FOREARM  Result Value Ref Range Status   Specimen Description BLOOD RIGHT FOREARM  Final   Special Requests   Final    BOTTLES DRAWN AEROBIC AND ANAEROBIC Blood Culture results may not be optimal due to an excessive volume of blood received in culture bottles   Culture   Final    NO GROWTH 2 DAYS Performed at [facility], [place] [address], [place], [place] [address]    Report Status PENDING  Incomplete  Blood Culture (routine x 2)     Status: None (Preliminary result)   Collection Time: 06/22/22 11:10 PM   Specimen: BLOOD   Result Value Ref Range Status   Specimen Description BLOOD RIGHT ANTECUBITAL  Final   Special Requests   Final    BOTTLES DRAWN AEROBIC AND ANAEROBIC Blood Culture results may not be optimal due to an excessive volume of blood received in culture bottles   Culture   Final    NO GROWTH 2 DAYS Performed at [facility], [place] [address], [place],  [address]    Report Status PENDING  Incomplete  Resp Panel by RT-PCR (Flu A&B, Covid) Anterior Nasal Swab     Status: None   Collection Time: 06/22/22 11:41 PM   Specimen: Anterior Nasal Swab  Result Value Ref Range Status   SARS Coronavirus 2 by RT PCR NEGATIVE NEGATIVE Final    Comment: (NOTE) SARS-CoV-2 target nucleic acids are NOT DETECTED.  The SARS-CoV-2 RNA is generally detectable in upper respiratory specimens during the acute phase of infection. The lowest concentration of SARS-CoV-2 viral copies this assay can detect is 138 copies/mL. A negative result does not preclude SARS-Cov-2 infection and should not be used as the sole basis for treatment or other patient management decisions. A negative result may occur with  improper specimen collection/handling, submission of specimen other than nasopharyngeal swab, presence of viral mutation(s) within the areas targeted by this assay, and inadequate number of viral copies(<138 copies/mL). A negative result must be combined with clinical observations, patient history, and epidemiological information. The expected result is Negative.  Fact Sheet for Patients:  EntrepreneurPulse.com.au  Fact Sheet for Healthcare Providers:  IncredibleEmployment.be  This test is no t yet approved or cleared by the [place] FDA and  has been authorized for detection and/or diagnosis of SARS-CoV-2 by FDA under an Emergency Use Authorization (EUA). This EUA will remain  in effect (meaning this test can be used) for the duration of the COVID-19  declaration under Section 564(b)(1) of the Act, 21 U.S.C.section 360bbb-3(b)(1), unless the authorization is terminated  or revoked sooner.       Influenza A by PCR NEGATIVE NEGATIVE Final   Influenza B by PCR NEGATIVE NEGATIVE Final    Comment: (NOTE)  The Xpert Xpress SARS-CoV-2/FLU/RSV plus assay is intended as an aid in the diagnosis of influenza from Nasopharyngeal swab specimens and should not be used as a sole basis for treatment. Nasal washings and aspirates are unacceptable for Xpert Xpress SARS-CoV-2/FLU/RSV testing.  Fact Sheet for Patients: EntrepreneurPulse.com.au  Fact Sheet for Healthcare Providers: IncredibleEmployment.be  This test is not yet approved or cleared by the [place] FDA and has been authorized for detection and/or diagnosis of SARS-CoV-2 by FDA under an Emergency Use Authorization (EUA). This EUA will remain in effect (meaning this test can be used) for the duration of the COVID-19 declaration under Section 564(b)(1) of the Act, 21 U.S.C. section 360bbb-3(b)(1), unless the authorization is terminated or revoked.  Performed at [facility], [place] [address], [place], [place] [address]   Respiratory (~20 pathogens) panel by PCR     Status: None   Collection Time: 06/22/22 11:41 PM   Specimen: Nasopharyngeal Swab; Respiratory  Result Value Ref Range Status   Adenovirus NOT DETECTED NOT DETECTED Final   Coronavirus 229E NOT DETECTED NOT DETECTED Final    Comment: (NOTE) The Coronavirus on the Respiratory Panel, DOES NOT test for the novel  Coronavirus (2019 nCoV)    Coronavirus HKU1 NOT DETECTED NOT DETECTED Final   Coronavirus NL63 NOT DETECTED NOT DETECTED Final   Coronavirus OC43 NOT DETECTED NOT DETECTED Final   Metapneumovirus NOT DETECTED NOT DETECTED Final   Rhinovirus / Enterovirus NOT DETECTED NOT DETECTED Final   Influenza A NOT DETECTED NOT DETECTED Final   Influenza B NOT DETECTED NOT  DETECTED Final   Parainfluenza Virus 1 NOT DETECTED NOT DETECTED Final   Parainfluenza Virus 2 NOT DETECTED NOT DETECTED Final   Parainfluenza Virus 3 NOT DETECTED NOT DETECTED Final   Parainfluenza Virus 4 NOT DETECTED NOT DETECTED Final   Respiratory Syncytial Virus NOT DETECTED NOT DETECTED Final   Bordetella pertussis NOT DETECTED NOT DETECTED Final   Bordetella Parapertussis NOT DETECTED NOT DETECTED Final   Chlamydophila pneumoniae NOT DETECTED NOT DETECTED Final   Mycoplasma pneumoniae NOT DETECTED NOT DETECTED Final    Comment: Performed at [facility], [place]. [address], [place], [place] [address]  Urine Culture     Status: Abnormal   Collection Time: 06/23/22 12:15 AM   Specimen: In/Out Cath Urine  Result Value Ref Range Status   Specimen Description IN/OUT CATH URINE  Final   Special Requests   Final    NONE Performed at [facility], [place] [address], [place], [place] [address]    Culture MULTIPLE SPECIES PRESENT, SUGGEST RECOLLECTION (A)  Final   Report Status 06/24/2022 FINAL  Final  C Difficile Quick Screen w PCR reflex     Status: None   Collection Time: 06/23/22 10:34 AM   Specimen: Stool  Result Value Ref Range Status   C Diff antigen NEGATIVE NEGATIVE Final   C Diff toxin NEGATIVE NEGATIVE Final   C Diff interpretation No C. difficile detected.  Final    Comment: Performed at [facility], [place] [address], [place], [place] [address]    Labs: CBC: Recent Labs  Lab 06/18/22 1527 06/22/22 2305 06/24/22 0139  WBC 3.1* 6.4 6.4  NEUTROABS 1,252* 5.8  --   HGB 10.7* 10.2* 8.9*  HCT 32.4* 30.2* 26.8*  MCV 91.8 92.1 92.4  PLT 221 138* 78*   Basic Metabolic Panel: Recent Labs  Lab 06/18/22 1527 06/22/22 2305 06/24/22 0139  NA 139 136 134*  K 3.9 3.3* 3.2*  CL 104 103 103  CO2 26 22 22   GLUCOSE 109* 148* 98  BUN 14 9 11   CREATININE 0.88 0.86 0.76  CALCIUM 9.7 9.0 8.5*  MG 1.6  --   --    Liver Function Tests: Recent Labs  Lab  06/18/22 1527 06/22/22 2305  AST 21 28  ALT 12 16  ALKPHOS  --  42  BILITOT 0.4 0.6  PROT 6.1 5.9*  ALBUMIN  --  3.4*   CBG: No results for input(s): "GLUCAP" in the last 168 hours.  Discharge time spent: greater than 30 minutes.  Signed: Tawni Millers, MD [facility] 06/24/2022

## 2022-06-24 NOTE — Progress Notes (Signed)
PT AVS reviewed and pt verbalized understanding of all Dc teaching and instructions. Ivs removed without complications. PT leaving with sister as transportation.

## 2022-06-24 NOTE — Assessment & Plan Note (Signed)
Resolved, patient back to her baseline at the time of her discharge.

## 2022-06-24 NOTE — Hospital Course (Addendum)
Debbie Houston was admitted to the hospital with the working diagnosis of right wrist cellulitis.  81 yo female with the past medical history of hypertension, dyslipidemia, hypothyroidism, and colitis on Infliximab who presented with fever and altered mental status. On 07/31 she was diagnosed with urinary tract infection due to E coli resistant to Augmentin and ampicillin, she was treated with ciprofloxacin, first dose 06/22/22. About 12 hrs prior to admission she developed severe weakness, not able to ambulate, right wrist with edema and erythema. On her initial physical examination her temperature was 103, HR 119, RR 21 02 saturation 91%, lungs with no wheezing or rales, heart with S1 and S2 present and tachycardic, abdomen with tenderness to palpation right upper quadrant. Right wrist with erythema and edema.   Na 136, K 3,3 CL 103, bicarbonate 22, glucose 148 bun 9 cr 0,86  Wbc 6,4 hgb 10,2 plt 138   Ct abdomen and pelvis with no acute changes. Chest radiograph with no infiltrates, no cardiomegaly.   EKG 117, right bundle branch block, qtc 510, sinus rhythm with no significant ST segment or T wave changes.   Right wrist radiograph with no acute fracture or dislocation.   Hand surgery evaluation, right wrist extensor tenosynovitis.   Plan to transition antibiotic therapy to oral and complete full course of 5 days, for drug resistant E coli urine infection.  Follow up as outpatient.

## 2022-06-24 NOTE — Progress Notes (Signed)
PT Cancellation Note  Patient Details Name: Debbie Houston MRN: [id number] DOB: Apr 13, 1941   Cancelled Treatment:    Reason Eval/Treat Not Completed: PT screened, no needs identified, will sign off  Patient has been walking around entire unit without difficulty (per pt report). Noted no balance issues with OT evaluation. Will sign off   [place]  Office [phone]  Rexanne Mano 06/24/2022, 2:19 PM

## 2022-06-24 NOTE — Assessment & Plan Note (Signed)
Follow up as outpatient  Fasting glucose at the time of discharge is 98

## 2022-06-24 NOTE — Assessment & Plan Note (Addendum)
Right hand cellulitis has been ruled out. No significant edema or erythema,  Continue analgesics as needed.  Warm or cool compresses as needed.  Avoid flouroquinolones.

## 2022-06-24 NOTE — Assessment & Plan Note (Signed)
Continue ppi

## 2022-06-24 NOTE — Assessment & Plan Note (Signed)
Continue blood pressure monitoring

## 2022-06-24 NOTE — Assessment & Plan Note (Addendum)
UTI with drug resistant E coli, present on admission.  Patient had 4 days so fay of effective antibiotic therapy, will complete with one more day with cefdinir and plan to follow up as outpatient.

## 2022-06-24 NOTE — Assessment & Plan Note (Signed)
Hyponatremia,  Renal function stable with serum cr at 0,76, K ids 3,2 and serum Na 134 Plan to add 40 meq Kcl before her discharge and follow up electrolytes as outpatient.

## 2022-06-24 NOTE — Assessment & Plan Note (Addendum)
Patient has been on infliximab as outpatient.  Plan to follow up before new infusion, possible arthritis related to infusion.  Diarrhea is improving.

## 2022-06-24 NOTE — Assessment & Plan Note (Signed)
Pancytopenia with hgb at 8,9, wbdc 6.4 and plt at 78. Anemia of chronic disease.  Plan to follow up as outpatient.

## 2022-06-24 NOTE — Assessment & Plan Note (Signed)
Continue with levothyroxine

## 2022-06-24 NOTE — Assessment & Plan Note (Signed)
Patient on statin therapy.

## 2022-06-25 ENCOUNTER — Telehealth: Payer: Self-pay

## 2022-06-25 LAB — GASTROINTESTINAL PANEL BY PCR, STOOL (REPLACES STOOL CULTURE)

## 2022-06-25 NOTE — Telephone Encounter (Signed)
Called patient on 06/25/2022 , 4:25 PM in an attempt to reach the patient for a hospital follow up.   Admit date: 06/22/22 Discharge: 06/24/22   She does not have any questions or concerns about medications from the hospital admission. The patient's medications were reviewed over the phone, they were counseled to bring in all current medications to the hospital follow up visit.   I advised the patient to call if any questions or concerns arise about the hospital admission or medications    Home health was not started in the hospital.  All questions were answered and a follow up appointment was made.   Prior to Admission medications   Medication Sig Start Date End Date Taking? Authorizing Provider  acetaminophen (TYLENOL) 500 MG tablet Take 500 mg by mouth at bedtime as needed for moderate pain or headache.     [provider]  Ascorbic Acid (VITAMIN C) 1000 MG tablet Take 1,000 mg by mouth daily.     [provider]  aspirin EC 81 MG tablet Take 1 tablet (81 mg total) by mouth in the morning and at bedtime. Patient taking differently: Take 81 mg by mouth daily. 01/23/22   Willaim Sheng, MD  bisoprolol (ZEBETA) 5 MG tablet TAKE 1/2 TABLET BY MOUTH EVERY DAY IN THE EVENING FOR BLOOD PRESSURE Patient taking differently: Take 2.5 mg by mouth daily. 08/09/21   Alycia Rossetti, NP  Calcium Carbonate-Vit D-Min (CALCIUM 1200 PO) Take 1,200 mg by mouth daily.    [provider]  cefdinir (OMNICEF) 300 MG capsule Take 1 capsule (300 mg total) by mouth every 12 (twelve) hours for 1 day. 06/24/22 06/25/22  Arrien, Jimmy Picket, MD  CINNAMON PO Take 2,000 mg by mouth daily.    [provider]  Coenzyme Q10 (COQ10) 200 MG CAPS Take 200 mg by mouth daily.    [provider]  colestipol (COLESTID) 1 g tablet Take 4 g by mouth daily. 07/26/20   [provider]  cycloSPORINE (RESTASIS) 0.05 % ophthalmic emulsion Place 1 drop into both eyes 2 (two) times  daily.    [provider]  levothyroxine (SYNTHROID) 125 MCG tablet TAKE 1 TABLET BY MOUTH DAILY ON AN EMPTY STOMACH WITH ONLY WATER FOR 30 MINUTES(NO ANTACIDS MEDICATIONS, CALCIUM OR MAGNESIUM FOR 4 HOURS) Patient taking differently: Take 125 mcg by mouth daily before breakfast. 12/18/21   Alycia Rossetti, NP  loperamide (IMODIUM) 2 MG capsule Take 4 mg by mouth 2 (two) times daily. 06/04/22   [provider]  loratadine (CLARITIN) 10 MG tablet Take 10 mg by mouth daily as needed for allergies.    [provider]  Multiple Vitamin (THERA VITAMIN PO) Take 1 tablet by mouth daily.    [provider]  Omega-3 Fatty Acids (FISH OIL) 1200 MG CAPS Take 1,200 mg by mouth daily.     [provider]  pantoprazole (PROTONIX) 40 MG tablet Take 1 tablet  2 x /day  for Heartburn & Indigestion Patient taking differently: Take 40 mg by mouth 2 (two) times daily. 03/08/20   Unk Pinto, MD  Probiotic Product (PROBIOTIC PO) Take 1 capsule by mouth daily.    [provider]  PROLIA 60 MG/ML SOSY injection Inject 60 mg into the skin every 6 (six) months. 01/28/20   [provider]  pseudoephedrine (SUDAFED) 120 MG 12 hr tablet Take  1 tablet  2 x /day (every 12 hours)  for Head and Chest Congestion Patient taking  differently: Take 120 mg by mouth daily as needed for congestion (Head and chest congestion). 11/15/21   Unk Pinto, MD  simvastatin (ZOCOR) 20 MG tablet Take  1 tablet  at Bedtime  for Cholesterol                                                 /                        TAKE             BY MOUTH Patient taking differently: Take 20 mg by mouth daily at 6 PM. 01/27/22   Unk Pinto, MD

## 2022-06-25 NOTE — Telephone Encounter (Signed)
LM-06/25/22-Called pt. To complete ED discharge protocol. Unable to reach pt. [place] X1.  Total time spent: 2 min.

## 2022-06-26 NOTE — Telephone Encounter (Signed)
LM-06/26/22-Called pt. To complete ED discharge protocol. Unable to reach pt. [place] X2.  Total time spent: 2 min.

## 2022-06-27 ENCOUNTER — Encounter: Payer: Self-pay | Admitting: Nurse Practitioner

## 2022-06-27 ENCOUNTER — Ambulatory Visit (INDEPENDENT_AMBULATORY_CARE_PROVIDER_SITE_OTHER): Payer: Medicare Other | Admitting: Nurse Practitioner

## 2022-06-27 VITALS — BP 146/76 | HR 83 | Temp 97.5°F | Ht 64.0 in | Wt 109.0 lb

## 2022-06-27 DIAGNOSIS — R202 Paresthesia of skin: Secondary | ICD-10-CM | POA: Diagnosis not present

## 2022-06-27 DIAGNOSIS — E861 Hypovolemia: Secondary | ICD-10-CM | POA: Diagnosis not present

## 2022-06-27 DIAGNOSIS — D649 Anemia, unspecified: Secondary | ICD-10-CM

## 2022-06-27 DIAGNOSIS — G9341 Metabolic encephalopathy: Secondary | ICD-10-CM | POA: Diagnosis not present

## 2022-06-27 DIAGNOSIS — I10 Essential (primary) hypertension: Secondary | ICD-10-CM | POA: Diagnosis not present

## 2022-06-27 DIAGNOSIS — Z79899 Other long term (current) drug therapy: Secondary | ICD-10-CM

## 2022-06-27 DIAGNOSIS — L03818 Cellulitis of other sites: Secondary | ICD-10-CM | POA: Diagnosis not present

## 2022-06-27 DIAGNOSIS — Z09 Encounter for follow-up examination after completed treatment for conditions other than malignant neoplasm: Secondary | ICD-10-CM

## 2022-06-27 DIAGNOSIS — N390 Urinary tract infection, site not specified: Secondary | ICD-10-CM

## 2022-06-27 DIAGNOSIS — A419 Sepsis, unspecified organism: Secondary | ICD-10-CM | POA: Diagnosis not present

## 2022-06-27 DIAGNOSIS — K580 Irritable bowel syndrome with diarrhea: Secondary | ICD-10-CM

## 2022-06-27 LAB — CULTURE, BLOOD (ROUTINE X 2)
Culture: NO GROWTH
Culture: NO GROWTH

## 2022-06-27 MED ORDER — GABAPENTIN 100 MG PO CAPS: 100.0000 mg | ORAL_CAPSULE | Freq: Every day | ORAL | 0 refills | Status: DC

## 2022-06-27 NOTE — Patient Instructions (Signed)
Paresthesia Paresthesia is a burning or prickling feeling. This feeling can happen in any part of the body. It often happens in the hands, arms, legs, or feet. Usually, it is not painful. In most cases, the feeling goes away in a short time and is not a sign of a serious problem. If you have paresthesia that lasts a long time, you need to see your doctor. Follow these instructions at home: Nutrition Eat a healthy diet. This includes: Eating foods that are high in fiber. These include beans, whole grains, and fresh fruits and vegetables. Limiting foods that are high in fat and sugar. These include fried or sweet foods.  Alcohol use  Do not drink alcohol if: Your doctor tells you not to drink. You are pregnant, may be pregnant, or are planning to become pregnant. If you drink alcohol: Limit how much you have to: 0-1 drink a day for women. 0-2 drinks a day for men. Know how much alcohol is in your drink. In the [place] one drink equals one 12 oz bottle of beer (355 mL), one 5 oz glass of wine (148 mL), or one 1 oz glass of hard liquor (44 mL). General instructions Take over-the-counter and prescription medicines only as told by your doctor. Do not smoke or use any products that contain nicotine or tobacco. If you need help quitting, ask your doctor. If you have diabetes, work with your doctor to make sure your blood sugar stays in a healthy range. If your feet feel numb: Check for redness, warmth, and swelling every day. Wear padded socks and comfortable shoes. These help protect your feet. Keep all follow-up visits. Contact a doctor if: You have paresthesia that gets worse or does not go away. You lose feeling (have numbness) after an injury. Your burning or prickling feeling gets worse when you walk. You have pain or cramps. You feel dizzy or you faint. You have a rash. Get help right away if: You feel weak or have new weakness in an arm or leg. You have trouble walking or  moving. You have problems speaking, understanding, or seeing. You feel confused. You cannot control when you pee (urinate) or poop (have a bowel movement). These symptoms may be an emergency. Get help right away. Call 911. Do not wait to see if the symptoms will go away. Do not drive yourself to the hospital. Summary Paresthesia is a burning or prickling feeling. It often happens in the hands, arms, legs, or feet. In most cases, the feeling goes away in a short time and is not a sign of a serious problem. If you have paresthesia that lasts a long time, you need to be seen by your doctor. This information is not intended to replace advice given to you by your health care provider. Make sure you discuss any questions you have with your health care provider. Document Revised: 07/17/2021 Document Reviewed: 07/17/2021 Elsevier Patient Education  [place].

## 2022-06-27 NOTE — Telephone Encounter (Signed)
LM-06/27/22-Called pt. And asked how she was feeling. Pt. Noted the UTI is not bothering her, that she is more concerned about  numbness in both hands and fingers. Pt. Stated it won't go away, and stated she has an appointment this afternoon to see Darrol Jump, NP. Educated patient on UTI prevention- drink plenty of fluids daily, urinate before and after intercourse, urinate at least every 4 hours, and avoid using scented feminine hygiene products. Also educated pt. On perineal care- wipe front to back after using the bathroom as wiping from back to front can transfer bacteria from the anus and rectum closer to the urethral opening, making a UTI more likely to develop. Informed pt. Wiping from front to back pushes unwanted bacteria away from the urethral opening. Informed pt. That feminine hygiene products, such as deodorants, douches and powders, often contain fragrances and other ingredients that can potentially irritate sensitive genital tissues and the urethra. Informed pt. these products can also upset the vaginal microbiome and result in the overgrowth of harmful bacteria that can lead to a UTI especially w/ pt. being in a immunocompromised state. Pt. Verbalized understanding and agreed. Advised pt. To seek medical attention ASAP w/ PCP if she has the following s/s- bloody urine,  fever, Nausea and/or vomiting, severe pain in the abdomen, back, or sides. Pt. Verbalized understanding and agreed. Encouraged patient to reach out if she has any questions, or concerns. Pt. Verbalized in agreement.    Total time spent: 20 min.

## 2022-06-27 NOTE — Progress Notes (Signed)
Hospital follow up  Assessment and Plan: Hospital visit follow up for:   1. Hospital discharge follow-up   2. Cellulitis of other specified site Resolved  - CBC with Differential/Platelet  3. Acute metabolic encephalopathy Resolved  - CBC with Differential/Platelet  4. Irritable bowel syndrome with diarrhea Hx of IBD/UC Discussed stopping Remicade d/t SE x2 after injections. Continue to remain well hydrated. Continue to monitor  5. Fluid volume deficit  - COMPLETE METABOLIC PANEL WITH GFR - Magnesium  6. Low hemoglobin  - CBC with Differential/Platelet  7. Sepsis secondary to UTI ([place])  - Urinalysis w microscopic + reflex cultur  8. Normocytic anemia  - CBC with Differential/Platelet - Iron, TIBC and Ferritin Panel  9. Essential hypertension Discussed DASH (Dietary Approaches to Stop Hypertension) DASH diet is lower in sodium than a typical American diet. Cut back on foods that are high in saturated fat, cholesterol, and trans fats. Eat more whole-grain foods, fish, poultry, and nuts Remain active and exercise as tolerated daily.  Monitor BP at home-Call if greater than 130/80.   - CBC with Differential/Platelet - COMPLETE METABOLIC PANEL WITH GFR  10. Paresthesia Start Gabapentin as directed.  - gabapentin (NEURONTIN) 100 MG capsule; Take 1 capsule (100 mg total) by mouth at bedtime.  Dispense: 30 capsule; Refill: 0  11. Medication management All medications discussed and reviewed in full. All questions and concerns regarding medications addressed.    - CBC with Differential/Platelet - COMPLETE METABOLIC PANEL WITH GFR - Urinalysis w microscopic + reflex cultur - Magnesium - Iron, TIBC and Ferritin Panel  Orders Placed This Encounter  Procedures   Urine Culture   CBC with Differential/Platelet   COMPLETE METABOLIC PANEL WITH GFR   Urinalysis w microscopic + reflex cultur   Magnesium   Iron, TIBC and Ferritin Panel   REFLEXIVE URINE CULTURE    Meds ordered this encounter  Medications   gabapentin (NEURONTIN) 100 MG capsule    Sig: Take 1 capsule (100 mg total) by mouth at bedtime.    Dispense:  30 capsule    Refill:  0    Order Specific Question:   Supervising Provider    Answer:   Debbie Houston [phone]   All medications were reviewed with patient and family and fully reconciled. All questions answered fully, and patient and family members were encouraged to call the office with any further questions or concerns. Discussed goal to avoid readmission related to this diagnosis.  There are no discontinued medications.  Over 40 minutes of exam, counseling, chart review, and complex, high/moderate level critical decision making was performed this visit.   Future Appointments  Date Time Provider [place]  07/04/2022  2:30 PM Darrol Jump, NP [facility] None  07/27/2022 11:30 AM [facility] NURSE [facility]  09/14/2022 11:00 AM Bo Merino, MD [facility] None  09/18/2022 11:00 AM Debbie Rossetti, NP [facility] None  12/25/2022 10:30 AM Debbie Pinto, MD [facility] None  04/29/2023 10:30 AM Rex Kras, DO [facility] None  06/25/2023  2:00 PM Debbie Pinto, MD [facility] None     HPI Debbie Houston presents for follow up for transition from recent hospitalization or SNIF stay. Admit date to the hospital was 06/22/22, patient was discharged from the hospital on 06/24/22 and our clinical staff contacted the office the day after discharge to set up a follow up appointment. The discharge summary, medications, and diagnostic test results were reviewed before meeting with the patient. The patient was admitted for:   complaints of fever and  weakness.  She had visited PCP for annual screening and prevention visit on 7/31.  Due to an abnormal white blood cell count and urinalysis have been obtained which was noted to be abnormal.  PCP sent off a urine culture which revealed revealed E. coli which was resistant to a Augmentin and  ampicillin.  She had been prescribed ciprofloxacin 250 mg twice daily and had started it 06/21/22.  Prior to the patient starting the ciprofloxacin and being told that she had a UTI she had gone for her infusion of Remicade earlier that day.  Patient feels this is the start of may of her problems.  Over the last month or so patient reports that she has been having 4-5 bowel movements per day which was unusual for her and in the last few weeks she weeks had been having incontinence of her bowels.  She has hx of UC/IBD.  She had been taking Imodium up to 3 times daily along with her colstipol in an effort to try and control symptoms.  She had recently had a calprotectin stool level obtained 2 weeks ago which was noted to be 114.  The patient also reports that she has had abdominal pain mostly in the right upper quadrant in addition to the diarrhea.  Denied having any cough, chest pain, vomiting, dysuria, recent antibiotics, recent sick contacts, or blood in stool/urine.  Reported associated joint pains in both arms and legs after the Remicade, which was also present during the first injection. Patient was so weak to the point where she was unable to ambulate, and normally gets around without need of assistance.  She also reported acute swelling and pain of her right wrist that previously was not present which hospital treated as SIRS/cellulitis of right wrist.  She had received the IV infusion of Remicade on the right arm, but IV was placed in the antecubital fossa.   Upon admission into the emergency department patient was noted to be febrile up to 103 F, pulse 119, respirations 21, all other vital signs relatively maintained.  Labs noted WBC 6.4, hemoglobin 10.2, platelets 138, potassium 3.3, glucose 148, lactic acid 1.6-> 1.4, CRP 2.1, and ESR 35.  Chest x-ray was unremarkable.  Influenza and COVID-19 screening was negative.  Urinalysis was unremarkable.  Blood and urine cultures were obtained.  X-rays of the  right wrist showed no acute abnormality with degenerative change.  Patient has been given 1 L lactated Ringer's, 100 mg of ibuprofen p.o., fentanyl 12.5 mcg IV, and Rocephin 2 g IV.    During her hospital stay she was treated for SIRS, cellulitis or righ wrist, acute metabolic encephalopathy secondary to UTI, FVD, IBD, hypokalemia, weakness, thrombocytopenia, normocytic anemia.  She reports today that she is feeling a BLE numbness and tingling to her feet.  She feels this is a continued reaction from the Remicade.  She no longer wants to continue this medication.    Images while in the hospital: CT ABDOMEN PELVIS W CONTRAST  Result Date: 06/23/2022 CLINICAL DATA:  Acute abdominal pain EXAM: CT ABDOMEN AND PELVIS WITH CONTRAST TECHNIQUE: Multidetector CT imaging of the abdomen and pelvis was performed using the standard protocol following bolus administration of intravenous contrast. RADIATION DOSE REDUCTION: This exam was performed according to the departmental dose-optimization program which includes automated exposure control, adjustment of the mA and/or kV according to patient size and/or use of iterative reconstruction technique. CONTRAST:  166m OMNIPAQUE IOHEXOL 300 MG/ML  SOLN COMPARISON:  08/06/2021 FINDINGS: Lower chest: Scattered  hypoventilatory changes at the lung bases. No acute pleural or parenchymal lung disease. Hepatobiliary: Stable hepatic cysts. Otherwise no acute liver abnormality. The gallbladder is unremarkable. No biliary duct dilation. Pancreas: Unremarkable. No pancreatic ductal dilatation or surrounding inflammatory changes. Spleen: Normal in size without focal abnormality. Adrenals/Urinary Tract: There are bilateral nonobstructing renal calculi, measuring up to 4 mm on the right and 5 mm on the left. No obstructive uropathy. Normal bilateral renal parenchymal enhancement. The adrenals and bladder are grossly unremarkable. Stomach/Bowel: No bowel obstruction or ileus. No bowel wall  thickening or inflammatory change. Vascular/Lymphatic: Aortic atherosclerosis. No enlarged abdominal or pelvic lymph nodes. Reproductive: Stable calcified uterine fibroids.  No adnexal masses. Other: No free fluid or free intraperitoneal gas. No abdominal wall hernia. Musculoskeletal: Unremarkable appearance of a right hip arthroplasty. Prior L4-5 posterior fusion as well as L3 through L5 vertebral augmentation. Reconstructed images demonstrate no additional findings. IMPRESSION: 1. No acute intra-abdominal or intrapelvic process. 2. Stable bilateral nonobstructing renal calculi. 3. Aortic Atherosclerosis (ICD10-I70.0) and Emphysema (ICD10-J43.9). Electronically Signed   By: Randa Ngo M.D.   On: 06/23/2022 16:39   DG Wrist Complete Right  Result Date: 06/23/2022 CLINICAL DATA:  Right wrist pain and swelling. EXAM: RIGHT WRIST - COMPLETE 3+ VIEW COMPARISON:  None Available. FINDINGS: No evidence of acute fracture or dislocation. A bony density is seen at the ulnar styloid which may be related to old trauma. Severe degenerative changes are present at the first carpometacarpal joint. Mild degenerative changes are present at the first metacarpal phalangeal joint. There is calcification of the TFCC and articular cartilage at the radiocarpal joints. Soft tissue swelling is noted at the wrist. IMPRESSION: 1. No acute fracture or dislocation. 2. Degenerative changes at the wrist, first carpometacarpal and metacarpal phalangeal joints. Electronically Signed   By: Brett Fairy M.D.   On: 06/23/2022 01:25   DG Chest Port 1 View  Result Date: 06/22/2022 CLINICAL DATA:  Possible sepsis EXAM: PORTABLE CHEST 1 VIEW COMPARISON:  05/13/2022 FINDINGS: The heart size and mediastinal contours are within normal limits. Both lungs are clear. The visualized skeletal structures are unremarkable. IMPRESSION: No active disease. Electronically Signed   By: Inez Catalina M.D.   On: 06/22/2022 23:34     Current Outpatient  Medications (Endocrine & Metabolic):    levothyroxine (SYNTHROID) 125 MCG tablet, TAKE 1 TABLET BY MOUTH DAILY ON AN EMPTY STOMACH WITH ONLY WATER FOR 30 MINUTES(NO ANTACIDS MEDICATIONS, CALCIUM OR MAGNESIUM FOR 4 HOURS) (Patient taking differently: Take 125 mcg by mouth daily before breakfast.)   PROLIA 60 MG/ML SOSY injection, Inject 60 mg into the skin every 6 (six) months.   Current Outpatient Medications (Cardiovascular):    bisoprolol (ZEBETA) 5 MG tablet, TAKE 1/2 TABLET BY MOUTH EVERY DAY IN THE EVENING FOR BLOOD PRESSURE (Patient taking differently: Take 2.5 mg by mouth daily.)   colestipol (COLESTID) 1 g tablet, Take 4 g by mouth daily.   simvastatin (ZOCOR) 20 MG tablet, Take  1 tablet  at Bedtime  for Cholesterol                                                 /                        TAKE  BY MOUTH (Patient taking differently: Take 20 mg by mouth daily at 6 PM.)   Current Outpatient Medications (Respiratory):    loratadine (CLARITIN) 10 MG tablet, Take 10 mg by mouth daily as needed for allergies.   pseudoephedrine (SUDAFED) 120 MG 12 hr tablet, Take  1 tablet  2 x /day (every 12 hours)  for Head and Chest Congestion (Patient taking differently: Take 120 mg by mouth daily as needed for congestion (Head and chest congestion).)   Current Outpatient Medications (Analgesics):    acetaminophen (TYLENOL) 500 MG tablet, Take 500 mg by mouth at bedtime as needed for moderate pain or headache.    aspirin EC Debbie MG tablet, Take 1 tablet (Debbie mg total) by mouth in the morning and at bedtime. (Patient taking differently: Take Debbie mg by mouth daily.)     Current Outpatient Medications (Other):    Ascorbic Acid (VITAMIN C) 1000 MG tablet, Take 1,000 mg by mouth daily.    Calcium Carbonate-Vit D-Min (CALCIUM 1200 PO), Take 1,200 mg by mouth daily.   CINNAMON PO, Take 2,000 mg by mouth daily.   Coenzyme Q10 (COQ10) 200 MG CAPS, Take 200 mg by mouth daily.   cycloSPORINE (RESTASIS)  0.05 % ophthalmic emulsion, Place 1 drop into both eyes 2 (two) times daily.   loperamide (IMODIUM) 2 MG capsule, Take 4 mg by mouth 2 (two) times daily.   Multiple Vitamin (THERA VITAMIN PO), Take 1 tablet by mouth daily.   Omega-3 Fatty Acids (FISH OIL) 1200 MG CAPS, Take 1,200 mg by mouth daily.    pantoprazole (PROTONIX) 40 MG tablet, Take 1 tablet  2 x /day  for Heartburn & Indigestion (Patient taking differently: Take 40 mg by mouth 2 (two) times daily.)   Probiotic Product (PROBIOTIC PO), Take 1 capsule by mouth daily.  Current Facility-Administered Medications (Other):    inFLIXimab (REMICADE) 4.02 mg/kg = 200 mg in sodium chloride 0.9 % 250 mL infusion  Past Medical History:  Diagnosis Date   Arthritis    Benign labile hypertension    Cancer (HCC)    skin cancer on face   Colitis    DJD (degenerative joint disease)    Endometrial polyp    GERD (gastroesophageal reflux disease)    History of basal cell carcinoma excision    History of esophagitis    HOH (hard of hearing)    right ear better than left  -- wears no aides   Hyperlipidemia    Hypothyroidism    Neuromuscular disorder ([place])    neuropathy in feet   Osteopenia    Prediabetes    S/P right hip fracture 08/17/2020   Vitamin D deficiency    Wears glasses      Allergies  Allergen Reactions   Fosamax [Alendronate Sodium] Other (See Comments)    GI upset    Singulair [Montelukast Sodium] Other (See Comments)    Makes pt jittery   Wellbutrin [Bupropion] Hives   Clindamycin/Lincomycin Rash   Sulfa Antibiotics Rash    ROS: all negative except above.   Physical Exam: Filed Weights   06/27/22 1531  Weight: 109 lb (49.4 kg)   BP (!) 146/76   Pulse 83   Temp (!) 97.5 F (Debbie.4 C)   Ht 5' 4" (1.626 m)   Wt 109 lb (49.4 kg)   SpO2 96%   BMI 18.71 kg/m  General Appearance: Well nourished, in no apparent distress. Eyes: PERRLA, EOMs, conjunctiva no swelling or erythema Sinuses: No Frontal/maxillary  tenderness  ENT/Mouth: Ext aud canals clear, TMs without erythema, bulging. No erythema, swelling, or exudate on post pharynx.  Tonsils not swollen or erythematous. Hearing normal.  Neck: Supple, thyroid normal.  Respiratory: Respiratory effort normal, BS equal bilaterally without rales, rhonchi, wheezing or stridor.  Cardio: RRR with no MRGs. Brisk peripheral pulses without edema.  Abdomen: Soft, + BS.  Non tender, no guarding, rebound, hernias, masses. Lymphatics: Non tender without lymphadenopathy.  Musculoskeletal: Full ROM, 5/5 strength, normal gait.  Skin: Warm, dry without rashes, lesions, ecchymosis.  Neuro: Cranial nerves intact. Normal muscle tone, no cerebellar symptoms. Sensation intact.  Psych: Awake and oriented X 3, normal affect, Insight and Judgment appropriate.     Darrol Jump, NP 3:53 PM [facility]

## 2022-06-27 NOTE — Telephone Encounter (Signed)
LM-06/27/22-Called pt. To complete ED discharge and educated patient on peri care to prevent UTI's. Third attempt reaching patient unsuccessful. [place] x3.    Total time spent: 2 min

## 2022-06-29 LAB — CBC WITH DIFFERENTIAL/PLATELET
Absolute Monocytes: 519 cells/uL (ref 200–950)
Basophils Absolute: 40 cells/uL (ref 0–200)
Basophils Relative: 0.7 %
Eosinophils Absolute: 143 cells/uL (ref 15–500)
Eosinophils Relative: 2.5 %
HCT: 30 % — ABNORMAL LOW (ref 35.0–45.0)
Hemoglobin: 10.1 g/dL — ABNORMAL LOW (ref 11.7–15.5)
Lymphs Abs: 1562 cells/uL (ref 850–3900)
MCH: 30.6 pg (ref 27.0–33.0)
MCHC: 33.7 g/dL (ref 32.0–36.0)
MCV: 90.9 fL (ref 80.0–100.0)
MPV: 10.7 fL (ref 7.5–12.5)
Monocytes Relative: 9.1 %
Neutro Abs: 3437 cells/uL (ref 1500–7800)
Neutrophils Relative %: 60.3 %
Platelets: 195 10*3/uL (ref 140–400)
RBC: 3.3 10*6/uL — ABNORMAL LOW (ref 3.80–5.10)
RDW: 16.9 % — ABNORMAL HIGH (ref 11.0–15.0)
Total Lymphocyte: 27.4 %
WBC: 5.7 10*3/uL (ref 3.8–10.8)

## 2022-06-29 LAB — COMPLETE METABOLIC PANEL WITH GFR
AG Ratio: 1.5 (calc) (ref 1.0–2.5)
ALT: 14 U/L (ref 6–29)
AST: 19 U/L (ref 10–35)
Albumin: 3.6 g/dL (ref 3.6–5.1)
Alkaline phosphatase (APISO): 48 U/L (ref 37–153)
BUN: 13 mg/dL (ref 7–25)
CO2: 27 mmol/L (ref 20–32)
Calcium: 9 mg/dL (ref 8.6–10.4)
Chloride: 103 mmol/L (ref 98–110)
Creat: 0.71 mg/dL (ref 0.60–0.95)
Globulin: 2.4 g/dL (calc) (ref 1.9–3.7)
Glucose, Bld: 119 mg/dL — ABNORMAL HIGH (ref 65–99)
Potassium: 3.8 mmol/L (ref 3.5–5.3)
Sodium: 137 mmol/L (ref 135–146)
Total Bilirubin: 0.3 mg/dL (ref 0.2–1.2)
Total Protein: 6 g/dL — ABNORMAL LOW (ref 6.1–8.1)
eGFR: 86 mL/min/{1.73_m2} (ref >=60)

## 2022-06-29 LAB — URINALYSIS W MICROSCOPIC + REFLEX CULTURE
Bacteria, UA: NONE SEEN /HPF
Bilirubin Urine: NEGATIVE
Glucose, UA: NEGATIVE
Hgb urine dipstick: NEGATIVE
Hyaline Cast: NONE SEEN /LPF
Ketones, ur: NEGATIVE
Nitrites, Initial: NEGATIVE
Protein, ur: NEGATIVE
RBC / HPF: NONE SEEN /HPF (ref 0–2)
Specific Gravity, Urine: 1.015 (ref 1.001–1.035)
Squamous Epithelial / HPF: NONE SEEN /HPF (ref <=5)
WBC, UA: NONE SEEN /HPF (ref 0–5)
pH: 7 (ref 5.0–8.0)

## 2022-06-29 LAB — IRON,TIBC AND FERRITIN PANEL
%SAT: 16 % (calc) (ref 16–45)
Ferritin: 72 ng/mL (ref 16–288)
Iron: 56 ug/dL (ref 45–160)
TIBC: 348 mcg/dL (calc) (ref 250–450)

## 2022-06-29 LAB — URINE CULTURE
MICRO NUMBER:: 13760657
Result:: NO GROWTH
SPECIMEN QUALITY:: ADEQUATE

## 2022-06-29 LAB — MAGNESIUM: Magnesium: 1.6 mg/dL (ref 1.5–2.5)

## 2022-06-29 LAB — CULTURE INDICATED

## 2022-07-02 ENCOUNTER — Encounter: Payer: Self-pay | Admitting: Nurse Practitioner

## 2022-07-02 DIAGNOSIS — A419 Sepsis, unspecified organism: Secondary | ICD-10-CM

## 2022-07-03 ENCOUNTER — Other Ambulatory Visit: Payer: Medicare Other

## 2022-07-03 ENCOUNTER — Other Ambulatory Visit: Payer: Self-pay

## 2022-07-03 DIAGNOSIS — N390 Urinary tract infection, site not specified: Secondary | ICD-10-CM | POA: Diagnosis not present

## 2022-07-03 DIAGNOSIS — A419 Sepsis, unspecified organism: Secondary | ICD-10-CM | POA: Diagnosis not present

## 2022-07-04 ENCOUNTER — Inpatient Hospital Stay: Payer: Medicare Other | Admitting: Nurse Practitioner

## 2022-07-06 LAB — URINALYSIS W MICROSCOPIC + REFLEX CULTURE
Bacteria, UA: NONE SEEN /HPF
Bilirubin Urine: NEGATIVE
Glucose, UA: NEGATIVE
Hgb urine dipstick: NEGATIVE
Hyaline Cast: NONE SEEN /LPF
Ketones, ur: NEGATIVE
Nitrites, Initial: NEGATIVE
Protein, ur: NEGATIVE
Specific Gravity, Urine: 1.012 (ref 1.001–1.035)
pH: 6.5 (ref 5.0–8.0)

## 2022-07-06 LAB — URINE CULTURE
MICRO NUMBER:: 13786753
SPECIMEN QUALITY:: ADEQUATE

## 2022-07-06 LAB — CULTURE INDICATED

## 2022-07-09 ENCOUNTER — Encounter: Payer: Self-pay | Admitting: Nurse Practitioner

## 2022-07-10 ENCOUNTER — Ambulatory Visit (INDEPENDENT_AMBULATORY_CARE_PROVIDER_SITE_OTHER): Payer: Medicare Other | Admitting: Nurse Practitioner

## 2022-07-10 ENCOUNTER — Ambulatory Visit
Admission: RE | Admit: 2022-07-10 | Discharge: 2022-07-10 | Disposition: A | Discharge status: Home / Self Care | Payer: Medicare Other | Source: Ambulatory Visit | Attending: Nurse Practitioner | Admitting: Nurse Practitioner

## 2022-07-10 ENCOUNTER — Encounter: Payer: Self-pay | Admitting: Nurse Practitioner

## 2022-07-10 ENCOUNTER — Other Ambulatory Visit: Payer: Self-pay | Admitting: Nurse Practitioner

## 2022-07-10 VITALS — BP 110/62 | HR 77 | Temp 97.7°F | Ht 64.0 in | Wt 106.8 lb

## 2022-07-10 DIAGNOSIS — M79642 Pain in left hand: Secondary | ICD-10-CM | POA: Diagnosis not present

## 2022-07-10 DIAGNOSIS — D649 Anemia, unspecified: Secondary | ICD-10-CM

## 2022-07-10 DIAGNOSIS — M810 Age-related osteoporosis without current pathological fracture: Secondary | ICD-10-CM

## 2022-07-10 DIAGNOSIS — R202 Paresthesia of skin: Secondary | ICD-10-CM | POA: Diagnosis not present

## 2022-07-10 DIAGNOSIS — Z8719 Personal history of other diseases of the digestive system: Secondary | ICD-10-CM | POA: Diagnosis not present

## 2022-07-10 DIAGNOSIS — M79641 Pain in right hand: Secondary | ICD-10-CM

## 2022-07-10 DIAGNOSIS — E538 Deficiency of other specified B group vitamins: Secondary | ICD-10-CM

## 2022-07-10 DIAGNOSIS — R200 Anesthesia of skin: Secondary | ICD-10-CM | POA: Diagnosis not present

## 2022-07-10 NOTE — Progress Notes (Signed)
Assessment and Plan:  Debbie Houston was seen today for a follow up.  Diagnoses and all order for this visit:  1. Normocytic anemia Considering Hemoglobin has not returned to baseline, will review levels and adjust POC as needed.  - CBC with Differential/Platelet  2. B12 deficiency Due to reports of numbness/tingling will obtain b12 levels for review.  - Vitamin B12  3. Bilateral hand pain Continue to follow up with pain management, neurology and orthopedics as scheduled.  - DG Cervical Spine Complete; Future  4. Numbness and tingling Increase Gabapentin to 200-300 mg HS.  - DG Cervical Spine Complete; Future  5. History of ulcerative colitis   6. Age-related osteoporosis without current pathological fracture    We discussed how multiple medications may be contributing to symptoms.  I have asked her to discuss these medications with providers in hopes to possibly discontinue.  Continue to monitor for any increase in fever, chills, N/V, diarrhea, changes to bowel habits, blood in stool.  Notify office for further evaluation and treatment, questions or concerns if s/s fail to improve. The risks and benefits of my recommendations, as well as other treatment options were discussed with the patient today. Questions were answered.  Further disposition pending results of labs. Discussed med's effects and SE's.    Over 20 minutes of exam, counseling, chart review, and critical decision making was performed.   Future Appointments  Date Time Provider [place]  07/27/2022 11:30 AM [facility] NURSE [facility]  09/14/2022 11:00 AM Bo Merino, MD [facility] None  09/18/2022 11:00 AM Alycia Rossetti, NP [facility] None  12/25/2022 10:30 AM Unk Pinto, MD [facility] None  04/29/2023 10:30 AM Rex Kras, DO [facility] None  06/25/2023  2:00 PM Unk Pinto, MD [facility] None     ------------------------------------------------------------------------------------------------------------------   HPI BP 110/62   Pulse 77   Temp 97.7 F (36.5 C)   Ht 5' 4"  (1.626 m)   Wt 106 lb 12.8 oz (48.4 kg)   SpO2 98%   BMI 18.33 kg/m   81 y.o.female presents for ongoing complaints of BUE hand pain with associated numbness and tingling.  She was seen x1 week ago and provided Gabapentin 100 mg HS but reports this has not helped.  Reports pain with trying to brush teeth, holding the tooth brush, holding the steering wheel of the car, changing the gear shift. She feels useless at home because she is unable to use her hands without having pain.  She is also having pain wake her from sleep at night.  Of note, she presented to the ED on 05/11/2022 with c/o acute pain of the left shoulder after completing a cervical neck ablation procedure by pain medicine specialist .  She was able to leave the practice, but once she returned home and woke the next morning she developed worsening shoulder pain, most notably when trying to reach for something.  She stated during that time that her injections were at the base of her head and did not involve the shoulder.   She was provided Norco, Morphine and Decadron for pain management.  Septic joint ws rule out and she was asked to perform gentle ROM exercises.  Subsequently on 05/13/22 she returned back to ED for new presentation of pain that occurred in the chest, and all throughout her body.  CTA was performed which was negative for PE or other acute intrathoracic process.  She also reported developing a low grade fever.  She has been taking Prolia for tmt  of Osteoporosis and reports recently starting Evenity.    She has also started Remicade which she reports having an initial reaction to, however, continued with a second dose and also had a reaction.  She is no longer taking this medication.  She is unsure if having a reaction to this medication  triggered the increase in BUE hand pain.  She plans to follow up with Weston Anna, Ortho this Friday (3 days) for s/p hip surgery.   Past Medical History:  Diagnosis Date   Arthritis    Benign labile hypertension    Cancer (HCC)    skin cancer on face   Colitis    DJD (degenerative joint disease)    Endometrial polyp    GERD (gastroesophageal reflux disease)    History of basal cell carcinoma excision    History of esophagitis    HOH (hard of hearing)    right ear better than left  -- wears no aides   Hyperlipidemia    Hypothyroidism    Neuromuscular disorder ([place])    neuropathy in feet   Osteopenia    Prediabetes    S/P right hip fracture 08/17/2020   Vitamin D deficiency    Wears glasses      Allergies  Allergen Reactions   Remicade [Infliximab]    Fosamax [Alendronate Sodium] Other (See Comments)    GI upset    Singulair [Montelukast Sodium] Other (See Comments)    Makes pt jittery   Wellbutrin [Bupropion] Hives   Clindamycin/Lincomycin Rash   Sulfa Antibiotics Rash    Current Outpatient Medications on File Prior to Visit  Medication Sig   acetaminophen (TYLENOL) 500 MG tablet Take 500 mg by mouth at bedtime as needed for moderate pain or headache.    Ascorbic Acid (VITAMIN C) 1000 MG tablet Take 1,000 mg by mouth daily.    aspirin EC 81 MG tablet Take 1 tablet (81 mg total) by mouth in the morning and at bedtime. (Patient taking differently: Take 81 mg by mouth daily.)   bisoprolol (ZEBETA) 5 MG tablet TAKE 1/2 TABLET BY MOUTH EVERY DAY IN THE EVENING FOR BLOOD PRESSURE (Patient taking differently: Take 2.5 mg by mouth daily.)   Calcium Carbonate-Vit D-Min (CALCIUM 1200 PO) Take 1,200 mg by mouth daily.   CINNAMON PO Take 2,000 mg by mouth daily.   Coenzyme Q10 (COQ10) 200 MG CAPS Take 200 mg by mouth daily.   colestipol (COLESTID) 1 g tablet Take 4 g by mouth daily.   cycloSPORINE (RESTASIS) 0.05 % ophthalmic emulsion Place 1 drop into both eyes 2 (two)  times daily.   gabapentin (NEURONTIN) 100 MG capsule Take 1 capsule (100 mg total) by mouth at bedtime.   levothyroxine (SYNTHROID) 125 MCG tablet TAKE 1 TABLET BY MOUTH DAILY ON AN EMPTY STOMACH WITH ONLY WATER FOR 30 MINUTES(NO ANTACIDS MEDICATIONS, CALCIUM OR MAGNESIUM FOR 4 HOURS) (Patient taking differently: Take 125 mcg by mouth daily before breakfast.)   loperamide (IMODIUM) 2 MG capsule Take 4 mg by mouth 2 (two) times daily.   loratadine (CLARITIN) 10 MG tablet Take 10 mg by mouth daily as needed for allergies.   Multiple Vitamin (THERA VITAMIN PO) Take 1 tablet by mouth daily.   Omega-3 Fatty Acids (FISH OIL) 1200 MG CAPS Take 1,200 mg by mouth daily.    pantoprazole (PROTONIX) 40 MG tablet Take 1 tablet  2 x /day  for Heartburn & Indigestion (Patient taking differently: Take 40 mg by mouth 2 (two) times daily.)  Probiotic Product (PROBIOTIC PO) Take 1 capsule by mouth daily.   PROLIA 60 MG/ML SOSY injection Inject 60 mg into the skin every 6 (six) months.   pseudoephedrine (SUDAFED) 120 MG 12 hr tablet Take  1 tablet  2 x /day (every 12 hours)  for Head and Chest Congestion (Patient taking differently: Take 120 mg by mouth daily as needed for congestion (Head and chest congestion).)   simvastatin (ZOCOR) 20 MG tablet Take  1 tablet  at Bedtime  for Cholesterol                                                 /                        TAKE             BY MOUTH (Patient taking differently: Take 20 mg by mouth daily at 6 PM.)   Current Facility-Administered Medications on File Prior to Visit  Medication   inFLIXimab (REMICADE) 4.02 mg/kg = 200 mg in sodium chloride 0.9 % 250 mL infusion    ROS: all negative except what is noted in the HPI.   Physical Exam:  BP 110/62   Pulse 77   Temp 97.7 F (36.5 C)   Ht 5' 4"  (1.626 m)   Wt 106 lb 12.8 oz (48.4 kg)   SpO2 98%   BMI 18.33 kg/m   General Appearance: NAD.  Awake, conversant and cooperative. Eyes: PERRLA, EOMs intact.  Sclera  white.  Conjunctiva without erythema. Sinuses: No frontal/maxillary tenderness.  No nasal discharge. Nares patent.  ENT/Mouth: Ext aud canals clear.  Bilateral TMs w/DOL and without erythema or bulging. Hearing intact.  Posterior pharynx without swelling or exudate.  Tonsils without swelling or erythema.  Neck: Supple.  No masses, nodules or thyromegaly. Respiratory: Effort is regular with non-labored breathing. Breath sounds are equal bilaterally without rales, rhonchi, wheezing or stridor.  Cardio: RRR with no MRGs. Brisk peripheral pulses without edema.  Abdomen: Active BS in all four quadrants.  Soft and non-tender without guarding, rebound tenderness, hernias or masses. Lymphatics: Non tender without lymphadenopathy.  Musculoskeletal: Full ROM, 5/5 strength, normal ambulation.  No clubbing or cyanosis. Skin: Appropriate color for ethnicity. Warm without rashes, lesions, ecchymosis, ulcers.  Neuro: CN II-XII grossly normal. Normal muscle tone without cerebellar symptoms and intact sensation.   Psych: AO X 3,  appropriate mood and affect, insight and judgment.     Darrol Jump, NP 2:05 PM [facility]

## 2022-07-11 LAB — CBC WITH DIFFERENTIAL/PLATELET
Absolute Monocytes: 451 cells/uL (ref 200–950)
Basophils Absolute: 41 cells/uL (ref 0–200)
Basophils Relative: 0.9 %
Eosinophils Absolute: 101 cells/uL (ref 15–500)
Eosinophils Relative: 2.2 %
HCT: 31.7 % — ABNORMAL LOW (ref 35.0–45.0)
Hemoglobin: 10.5 g/dL — ABNORMAL LOW (ref 11.7–15.5)
Lymphs Abs: 1302 cells/uL (ref 850–3900)
MCH: 31.3 pg (ref 27.0–33.0)
MCHC: 33.1 g/dL (ref 32.0–36.0)
MCV: 94.3 fL (ref 80.0–100.0)
MPV: 10.5 fL (ref 7.5–12.5)
Monocytes Relative: 9.8 %
Neutro Abs: 2705 cells/uL (ref 1500–7800)
Neutrophils Relative %: 58.8 %
Platelets: 266 10*3/uL (ref 140–400)
RBC: 3.36 10*6/uL — ABNORMAL LOW (ref 3.80–5.10)
RDW: 16.8 % — ABNORMAL HIGH (ref 11.0–15.0)
Total Lymphocyte: 28.3 %
WBC: 4.6 10*3/uL (ref 3.8–10.8)

## 2022-07-11 LAB — VITAMIN B12: Vitamin B-12: 681 pg/mL (ref 200–1100)

## 2022-07-12 ENCOUNTER — Encounter: Payer: Self-pay | Admitting: Neurology

## 2022-07-12 ENCOUNTER — Encounter: Payer: Self-pay | Admitting: Nurse Practitioner

## 2022-07-12 ENCOUNTER — Other Ambulatory Visit: Payer: Self-pay | Admitting: Nurse Practitioner

## 2022-07-12 DIAGNOSIS — R202 Paresthesia of skin: Secondary | ICD-10-CM

## 2022-07-12 DIAGNOSIS — M79641 Pain in right hand: Secondary | ICD-10-CM

## 2022-07-13 DIAGNOSIS — M25551 Pain in right hip: Secondary | ICD-10-CM | POA: Diagnosis not present

## 2022-07-17 ENCOUNTER — Ambulatory Visit: Admit: 2022-07-17 | Discharge: 2022-07-18 | Payer: MEDICARE | Attending: Internal Medicine | Primary: Internal Medicine

## 2022-07-17 DIAGNOSIS — D649 Anemia, unspecified: Principal | ICD-10-CM

## 2022-07-17 DIAGNOSIS — D509 Iron deficiency anemia, unspecified: Principal | ICD-10-CM

## 2022-07-17 DIAGNOSIS — K529 Noninfective gastroenteritis and colitis, unspecified: Principal | ICD-10-CM

## 2022-07-17 DIAGNOSIS — Z681 Body mass index (BMI) 19 or less, adult: Secondary | ICD-10-CM | POA: Diagnosis not present

## 2022-07-17 MED ORDER — MESALAMINE 1.2 GRAM TABLET,DELAYED RELEASE
ORAL_TABLET | Freq: Every day | ORAL | 3 refills | 30 days | Status: CP
Start: 2022-07-17 — End: ?

## 2022-07-18 ENCOUNTER — Encounter: Payer: Self-pay | Admitting: Nurse Practitioner

## 2022-07-19 DIAGNOSIS — K2100 Gastro-esophageal reflux disease with esophagitis, without bleeding: Secondary | ICD-10-CM | POA: Diagnosis not present

## 2022-07-19 DIAGNOSIS — I10 Essential (primary) hypertension: Secondary | ICD-10-CM | POA: Diagnosis not present

## 2022-07-19 DIAGNOSIS — E782 Mixed hyperlipidemia: Secondary | ICD-10-CM | POA: Diagnosis not present

## 2022-07-19 DIAGNOSIS — E039 Hypothyroidism, unspecified: Secondary | ICD-10-CM | POA: Diagnosis not present

## 2022-07-25 ENCOUNTER — Telehealth: Payer: Self-pay | Admitting: *Deleted

## 2022-07-25 DIAGNOSIS — M47812 Spondylosis without myelopathy or radiculopathy, cervical region: Secondary | ICD-10-CM | POA: Diagnosis not present

## 2022-07-25 DIAGNOSIS — M79642 Pain in left hand: Secondary | ICD-10-CM | POA: Diagnosis not present

## 2022-07-25 DIAGNOSIS — M79641 Pain in right hand: Secondary | ICD-10-CM | POA: Diagnosis not present

## 2022-07-25 NOTE — Telephone Encounter (Signed)
-----   Message from Elberta Spaniel sent at 07/25/2022 10:19 AM EDT ----- Regarding: Hand numbness Pt cld to seeks advise if she should have be Evenity on Fri 9/8, she has been experiencing some significant bilateral hand numbness and is feeling very cautious about getting the injection until they find out what is causing the numbness.  Please contact pt w/ Advice

## 2022-07-26 ENCOUNTER — Ambulatory Visit (INDEPENDENT_AMBULATORY_CARE_PROVIDER_SITE_OTHER): Payer: Medicare Other | Admitting: Nurse Practitioner

## 2022-07-26 ENCOUNTER — Other Ambulatory Visit: Payer: Self-pay | Admitting: Nurse Practitioner

## 2022-07-26 ENCOUNTER — Other Ambulatory Visit: Payer: Self-pay

## 2022-07-26 ENCOUNTER — Encounter: Payer: Self-pay | Admitting: Nurse Practitioner

## 2022-07-26 VITALS — BP 122/62 | HR 75 | Temp 97.7°F | Ht 64.0 in | Wt 108.6 lb

## 2022-07-26 DIAGNOSIS — M79642 Pain in left hand: Secondary | ICD-10-CM | POA: Diagnosis not present

## 2022-07-26 DIAGNOSIS — R202 Paresthesia of skin: Secondary | ICD-10-CM | POA: Diagnosis not present

## 2022-07-26 DIAGNOSIS — Z1211 Encounter for screening for malignant neoplasm of colon: Secondary | ICD-10-CM | POA: Diagnosis not present

## 2022-07-26 DIAGNOSIS — Z1212 Encounter for screening for malignant neoplasm of rectum: Secondary | ICD-10-CM | POA: Diagnosis not present

## 2022-07-26 DIAGNOSIS — M79641 Pain in right hand: Secondary | ICD-10-CM | POA: Diagnosis not present

## 2022-07-26 DIAGNOSIS — Z79899 Other long term (current) drug therapy: Secondary | ICD-10-CM | POA: Diagnosis not present

## 2022-07-26 LAB — POC HEMOCCULT BLD/STL (HOME/3-CARD/SCREEN)
Card #2 Fecal Occult Blod, POC: POSITIVE
Card #3 Fecal Occult Blood, POC: POSITIVE
Fecal Occult Blood, POC: POSITIVE — AB

## 2022-07-26 MED ORDER — GABAPENTIN 100 MG PO CAPS: ORAL_CAPSULE | ORAL | 0 refills | Status: DC

## 2022-07-26 MED ORDER — ROPINIROLE HCL 0.25 MG PO TABS
0.2500 mg | ORAL_TABLET | Freq: Three times a day (TID) | ORAL | 0 refills | Status: DC
Start: 2022-07-26 — End: 2022-09-18

## 2022-07-26 NOTE — Patient Instructions (Signed)
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 Take Gabapentin at night.  Take Roprinrole during the day Okay to take up to 4,000 mg of Tylenol in 24 hours. Copper Fit ICE Compression Gloves Infused with Menthol, Black, Large/X-Large [employer]

## 2022-07-26 NOTE — Progress Notes (Unsigned)
She saw the Neurosurgeon yesterday, the one that did her ablation.    Nov 3rd Patel.  Neuro  She will get avitinty shot tomorrow  She started on a new medication with gastro called asocol   Refervto gi  She saw [place] nurse surgery and spine on 9620 and 23. Did you break? Was recommended that patient follow up with PCP to get a refill of gabapentin. Asking to escalate the dose. Discussed consideration of cervical epidural injection at C6 C7.

## 2022-07-26 NOTE — Telephone Encounter (Signed)
Left VM for patient. If she calls back please have her speak with a nurse/CMA and inform that it should be safe taking the evenity. The altered sensation is likely coming from some other source. We could always postpone for a few days/weeks on the evenity if should would like.   If any questions then please take the best time and phone number to call and I will try to call her back.   Rosemarie Ax, MD [facility] 07/26/2022, 1:29 PM

## 2022-07-27 ENCOUNTER — Ambulatory Visit (INDEPENDENT_AMBULATORY_CARE_PROVIDER_SITE_OTHER): Payer: Medicare Other | Admitting: *Deleted

## 2022-07-27 ENCOUNTER — Encounter: Payer: Self-pay | Admitting: *Deleted

## 2022-07-27 DIAGNOSIS — M8000XG Age-related osteoporosis with current pathological fracture, unspecified site, subsequent encounter for fracture with delayed healing: Secondary | ICD-10-CM | POA: Diagnosis not present

## 2022-07-27 MED ORDER — ROMOSOZUMAB-AQQG 105 MG/1.17ML ~~LOC~~ SOSY
210.0000 mg | PREFILLED_SYRINGE | Freq: Once | SUBCUTANEOUS | Status: AC
  Administered 2022-07-27: 210 mg via SUBCUTANEOUS

## 2022-07-27 NOTE — Progress Notes (Signed)
Patient is here for nurse visit for her 5th Evenity injection. Patient received bilateral [place] arm injections. She tolerated injections well. She will return in 1 month for her next Evenity injection.

## 2022-07-30 ENCOUNTER — Telehealth: Payer: Self-pay | Admitting: *Deleted

## 2022-07-30 NOTE — Chronic Care Management (AMB) (Signed)
  Care Coordination   Note   07/30/2022 Name: Debbie Houston MRN: [id number] DOB: 1941/01/01  Debbie Houston is a 81 y.o. year old female who sees Unk Pinto, MD for primary care. I reached out to Debbie Houston by phone today to offer care coordination services.  [name] was given information about Care Coordination services today including:   The Care Coordination services include support from the care team which includes your Nurse Coordinator, Clinical Social Worker, or Pharmacist.  The Care Coordination team is here to help remove barriers to the health concerns and goals most important to you. Care Coordination services are voluntary, and the patient may decline or stop services at any time by request to their care team member.   Care Coordination Consent Status: Patient agreed to services and verbal consent obtained.   Follow up plan:  Telephone appointment with care coordination team member scheduled for:  08/09/22  Encounter Outcome:  Pt. Scheduled  [place]  Direct Dial: [phone]

## 2022-08-01 ENCOUNTER — Encounter: Payer: Self-pay | Admitting: Internal Medicine

## 2022-08-06 ENCOUNTER — Ambulatory Visit (INDEPENDENT_AMBULATORY_CARE_PROVIDER_SITE_OTHER): Payer: Medicare Other | Admitting: Nurse Practitioner

## 2022-08-06 ENCOUNTER — Encounter: Payer: Self-pay | Admitting: Nurse Practitioner

## 2022-08-06 VITALS — BP 122/70 | HR 75 | Temp 97.7°F | Ht 64.0 in | Wt 110.4 lb

## 2022-08-06 DIAGNOSIS — R3911 Hesitancy of micturition: Secondary | ICD-10-CM

## 2022-08-06 DIAGNOSIS — R350 Frequency of micturition: Secondary | ICD-10-CM | POA: Diagnosis not present

## 2022-08-06 DIAGNOSIS — R300 Dysuria: Secondary | ICD-10-CM | POA: Diagnosis not present

## 2022-08-06 MED ORDER — NITROFURANTOIN MONOHYD MACRO 100 MG PO CAPS: 100.0000 mg | ORAL_CAPSULE | Freq: Two times a day (BID) | ORAL | 0 refills | Status: AC

## 2022-08-06 NOTE — Patient Instructions (Signed)
Urinary Tract Infection, Adult A urinary tract infection (UTI) is an infection of any part of the urinary tract. The urinary tract includes: The kidneys. The ureters. The bladder. The urethra. These organs make, store, and get rid of pee (urine) in the body. What are the causes? This infection is caused by germs (bacteria) in your genital area. These germs grow and cause swelling (inflammation) of your urinary tract. What increases the risk? The following factors may make you more likely to develop this condition: Using a small, thin tube (catheter) to drain pee. Not being able to control when you pee or poop (incontinence). Being female. If you are female, these things can increase the risk: Using these methods to prevent pregnancy: A medicine that kills sperm (spermicide). A device that blocks sperm (diaphragm). Having low levels of a female hormone (estrogen). Being pregnant. You are more likely to develop this condition if: You have genes that add to your risk. You are sexually active. You take antibiotic medicines. You have trouble peeing because of: A prostate that is bigger than normal, if you are female. A blockage in the part of your body that drains pee from the bladder. A kidney stone. A nerve condition that affects your bladder. Not getting enough to drink. Not peeing often enough. You have other conditions, such as: Diabetes. A weak disease-fighting system (immune system). Sickle cell disease. Gout. Injury of the spine. What are the signs or symptoms? Symptoms of this condition include: Needing to pee right away. Peeing small amounts often. Pain or burning when peeing. Blood in the pee. Pee that smells bad or not like normal. Trouble peeing. Pee that is cloudy. Fluid coming from the vagina, if you are female. Pain in the belly or lower back. Other symptoms include: Vomiting. Not feeling hungry. Feeling mixed up (confused). This may be the first symptom in  older adults. Being tired and grouchy (irritable). A fever. Watery poop (diarrhea). How is this treated? Taking antibiotic medicine. Taking other medicines. Drinking enough water. In some cases, you may need to see a specialist. Follow these instructions at home:  Medicines Take over-the-counter and prescription medicines only as told by your doctor. If you were prescribed an antibiotic medicine, take it as told by your doctor. Do not stop taking it even if you start to feel better. General instructions Make sure you: Pee until your bladder is empty. Do not hold pee for a long time. Empty your bladder after sex. Wipe from front to back after peeing or pooping if you are a female. Use each tissue one time when you wipe. Drink enough fluid to keep your pee pale yellow. Keep all follow-up visits. Contact a doctor if: You do not get better after 1-2 days. Your symptoms go away and then come back. Get help right away if: You have very bad back pain. You have very bad pain in your lower belly. You have a fever. You have chills. You feeling like you will vomit or you vomit. Summary A urinary tract infection (UTI) is an infection of any part of the urinary tract. This condition is caused by germs in your genital area. There are many risk factors for a UTI. Treatment includes antibiotic medicines. Drink enough fluid to keep your pee pale yellow. This information is not intended to replace advice given to you by your health care provider. Make sure you discuss any questions you have with your health care provider. Document Revised: 06/17/2020 Document Reviewed: 06/17/2020 Elsevier Patient Education    2023 Elsevier Inc.  

## 2022-08-06 NOTE — Progress Notes (Signed)
Assessment and Plan:  Debbie Houston was seen today for an episodic visit.  Diagnoses and all order for this visit:  1. Dysuria Stay well hydrated to keep system flushed. Consider cranberry supplement. Monitor for increase in back pain, fever, chills, N/V.   Contact office if noticed.  - Urinalysis, Routine w reflex microscopic - Urine Culture - nitrofurantoin, macrocrystal-monohydrate, (MACROBID) 100 MG capsule; Take 1 capsule (100 mg total) by mouth 2 (two) times daily for 5 days.  Dispense: 10 capsule; Refill: 0  2. Urinary frequency   3. Hesitancy of micturition   Notify office for further evaluation and treatment, questions or concerns if s/s fail to improve. The risks and benefits of my recommendations, as well as other treatment options were discussed with the patient today. Questions were answered.  Further disposition pending results of labs. Discussed med's effects and SE's.    Over 15 minutes of exam, counseling, chart review, and critical decision making was performed.   Future Appointments  Date Time Provider [place]  08/09/2022  2:00 PM Lazaro Arms, RN [facility] None  08/28/2022 10:00 AM [facility] NURSE [facility]  09/14/2022 11:00 AM Bo Merino, MD [facility] None  09/18/2022 11:00 AM Alycia Rossetti, NP [facility] None  09/21/2022  2:50 PM Narda Amber K, DO [facility] None  12/25/2022 10:30 AM Unk Pinto, MD [facility] None  04/29/2023 10:30 AM Rex Kras, DO [facility] None  06/25/2023  2:00 PM Unk Pinto, MD [facility] None    ------------------------------------------------------------------------------------------------------------------   HPI BP 122/70   Pulse 75   Temp 97.7 F (36.5 C)   Ht 5' 4"  (1.626 m)   Wt 110 lb 6.4 oz (50.1 kg)   SpO2 99%   BMI 18.95 kg/m    Patient complains of dysuria, frequency, and urgency. She has had symptoms for 3 days.  Patient denies back pain, fever, stomach ache, and vaginal discharge.  Patient does not have a history of recurrent UTI. Patient does not have a history of pyelonephritis.   She took an in home OTC UTI test which resulted as positive.      Past Medical History:  Diagnosis Date   Arthritis    Benign labile hypertension    Cancer (HCC)    skin cancer on face   Colitis    DJD (degenerative joint disease)    Endometrial polyp    GERD (gastroesophageal reflux disease)    History of basal cell carcinoma excision    History of esophagitis    HOH (hard of hearing)    right ear better than left  -- wears no aides   Hyperlipidemia    Hypothyroidism    Neuromuscular disorder ([place])    neuropathy in feet   Osteopenia    Prediabetes    S/P right hip fracture 08/17/2020   Vitamin D deficiency    Wears glasses      Allergies  Allergen Reactions   Remicade [Infliximab]    Fosamax [Alendronate Sodium] Other (See Comments)    GI upset    Singulair [Montelukast Sodium] Other (See Comments)    Makes pt jittery   Wellbutrin [Bupropion] Hives   Clindamycin/Lincomycin Rash   Sulfa Antibiotics Rash    Current Outpatient Medications on File Prior to Visit  Medication Sig   acetaminophen (TYLENOL) 500 MG tablet Take 500 mg by mouth at bedtime as needed for moderate pain or headache.    Ascorbic Acid (VITAMIN C) 1000 MG tablet Take 1,000 mg by mouth daily.  aspirin EC 81 MG tablet Take 1 tablet (81 mg total) by mouth in the morning and at bedtime. (Patient taking differently: Take 81 mg by mouth daily.)   bisoprolol (ZEBETA) 5 MG tablet TAKE 1/2 TABLET BY MOUTH EVERY DAY IN THE EVENING FOR BLOOD PRESSURE   Calcium Carbonate-Vit D-Min (CALCIUM 1200 PO) Take 1,200 mg by mouth daily.   CINNAMON PO Take 2,000 mg by mouth daily.   Coenzyme Q10 (COQ10) 200 MG CAPS Take 200 mg by mouth daily.   colestipol (COLESTID) 1 g tablet Take 4 g by mouth daily.   cycloSPORINE (RESTASIS) 0.05 % ophthalmic emulsion Place 1 drop into both eyes 2 (two) times daily.   gabapentin  (NEURONTIN) 100 MG capsule Take 2 caps at bedtime.   levothyroxine (SYNTHROID) 125 MCG tablet TAKE 1 TABLET BY MOUTH DAILY ON AN EMPTY STOMACH WITH ONLY WATER FOR 30 MINUTES(NO ANTACIDS MEDICATIONS, CALCIUM OR MAGNESIUM FOR 4 HOURS) (Patient taking differently: Take 125 mcg by mouth daily before breakfast.)   loperamide (IMODIUM) 2 MG capsule Take 4 mg by mouth 2 (two) times daily.   loratadine (CLARITIN) 10 MG tablet Take 10 mg by mouth daily as needed for allergies.   Mesalamine (ASACOL PO) Take by mouth. Take 2 tablets once daily   Multiple Vitamin (THERA VITAMIN PO) Take 1 tablet by mouth daily.   Omega-3 Fatty Acids (FISH OIL) 1200 MG CAPS Take 1,200 mg by mouth daily.    pantoprazole (PROTONIX) 40 MG tablet Take 1 tablet  2 x /day  for Heartburn & Indigestion (Patient taking differently: Take 40 mg by mouth 2 (two) times daily.)   Probiotic Product (PROBIOTIC PO) Take 1 capsule by mouth daily.   PROLIA 60 MG/ML SOSY injection Inject 60 mg into the skin every 6 (six) months.   pseudoephedrine (SUDAFED) 120 MG 12 hr tablet Take  1 tablet  2 x /day (every 12 hours)  for Head and Chest Congestion (Patient taking differently: Take 120 mg by mouth daily as needed for congestion (Head and chest congestion).)   rOPINIRole (REQUIP) 0.25 MG tablet Take 1 tablet (0.25 mg total) by mouth 3 (three) times daily.   simvastatin (ZOCOR) 20 MG tablet Take  1 tablet  at Bedtime  for Cholesterol                                                 /                        TAKE             BY MOUTH (Patient taking differently: Take 20 mg by mouth daily at 6 PM.)   Current Facility-Administered Medications on File Prior to Visit  Medication   inFLIXimab (REMICADE) 4.02 mg/kg = 200 mg in sodium chloride 0.9 % 250 mL infusion    ROS: all negative except what is noted in the HPI.   Physical Exam:  BP 122/70   Pulse 75   Temp 97.7 F (36.5 C)   Ht 5' 4"  (1.626 m)   Wt 110 lb 6.4 oz (50.1 kg)   SpO2 99%   BMI  18.95 kg/m   General Appearance: NAD.  Awake, conversant and cooperative. Eyes: PERRLA, EOMs intact.  Sclera white.  Conjunctiva without erythema. Sinuses: No frontal/maxillary tenderness.  No nasal  discharge. Nares patent.  ENT/Mouth: Ext aud canals clear.  Bilateral TMs w/DOL and without erythema or bulging. Hearing intact.  Posterior pharynx without swelling or exudate.  Tonsils without swelling or erythema.  Neck: Supple.  No masses, nodules or thyromegaly. Respiratory: Effort is regular with non-labored breathing. Breath sounds are equal bilaterally without rales, rhonchi, wheezing or stridor.  Cardio: RRR with no MRGs. Brisk peripheral pulses without edema.  Abdomen: Active BS in all four quadrants.  Soft and non-tender without guarding, rebound tenderness, hernias or masses. Lymphatics: Non tender without lymphadenopathy.  Musculoskeletal: Full ROM, 5/5 strength, normal ambulation.  No clubbing or cyanosis. Skin: Appropriate color for ethnicity. Warm without rashes, lesions, ecchymosis, ulcers.  Neuro: CN II-XII grossly normal. Normal muscle tone without cerebellar symptoms and intact sensation.   Psych: AO X 3,  appropriate mood and affect, insight and judgment.     Darrol Jump, NP 2:08 PM [facility]

## 2022-08-09 ENCOUNTER — Ambulatory Visit: Payer: Self-pay

## 2022-08-09 LAB — URINALYSIS, ROUTINE W REFLEX MICROSCOPIC
Bacteria, UA: NONE SEEN /HPF
Bilirubin Urine: NEGATIVE
Glucose, UA: NEGATIVE
Hgb urine dipstick: NEGATIVE
Hyaline Cast: NONE SEEN /LPF
Ketones, ur: NEGATIVE
Nitrite: NEGATIVE
Protein, ur: NEGATIVE
RBC / HPF: NONE SEEN /HPF (ref 0–2)
Specific Gravity, Urine: 1.006 (ref 1.001–1.035)
Squamous Epithelial / HPF: NONE SEEN /HPF (ref <=5)
pH: 6 (ref 5.0–8.0)

## 2022-08-09 LAB — URINE CULTURE
MICRO NUMBER:: 13931544
SPECIMEN QUALITY:: ADEQUATE

## 2022-08-09 LAB — MICROSCOPIC MESSAGE

## 2022-08-09 NOTE — Patient Outreach (Signed)
  Care Coordination   Initial Visit Note   08/09/2022 Name: DRENDA SOBECKI MRN: [id number] DOB: [birth year]  [name] is a 81 y.o. year old female who sees Unk Pinto, MD for primary care. I spoke with  [name] by phone today.  What matters to the patients health and wellness today?  The patient had no question or concerns.    Goals Addressed             This Visit's Progress    COMPLETED: Care Coordniation Activities - no Follow up required        Care Coordination Interventions: Active listening / Reflection utilized  Emotional Support Provided Problem [place] strategies reviewed Discussed/.Educated Care Coordination Program 2.   Discussed/.Educated Annual Wellness Visit 3.   Discussed/.Educated Social Determinates of Health 4.   Please inform PCP if services needed in the future         SDOH assessments and interventions completed:  Yes  SDOH Interventions Today    Flowsheet Row Most Recent Value  SDOH Interventions   Food Insecurity Interventions Intervention Not Indicated  Transportation Interventions Intervention Not Indicated        Care Coordination Interventions Activated:  Yes  Care Coordination Interventions:  Yes, provided   Follow up plan: No further intervention required.   Encounter Outcome:  Pt. Visit Completed   Lazaro Arms RN, BSN, [place] [facility]   Phone: [phone]

## 2022-08-09 NOTE — Patient Instructions (Signed)
Visit Information  Thank you for allowing me to share the  care coordination services that are available to you as part of your health plan and services through your primary care provider. Please reach out to your Office if the care coordination team may be of assistance to you in the future.   Lazaro Arms RN, BSN, [facility] Care Management Coordinator [facility]   Phone: [phone]

## 2022-08-10 ENCOUNTER — Encounter: Payer: Self-pay | Admitting: Nurse Practitioner

## 2022-08-10 DIAGNOSIS — D649 Anemia, unspecified: Secondary | ICD-10-CM | POA: Diagnosis not present

## 2022-08-10 DIAGNOSIS — D509 Iron deficiency anemia, unspecified: Secondary | ICD-10-CM | POA: Diagnosis not present

## 2022-08-10 DIAGNOSIS — K529 Noninfective gastroenteritis and colitis, unspecified: Secondary | ICD-10-CM | POA: Diagnosis not present

## 2022-08-13 DIAGNOSIS — Z23 Encounter for immunization: Secondary | ICD-10-CM | POA: Diagnosis not present

## 2022-08-16 DIAGNOSIS — H1132 Conjunctival hemorrhage, left eye: Secondary | ICD-10-CM | POA: Diagnosis not present

## 2022-08-16 DIAGNOSIS — H26493 Other secondary cataract, bilateral: Secondary | ICD-10-CM | POA: Diagnosis not present

## 2022-08-16 DIAGNOSIS — H52223 Regular astigmatism, bilateral: Secondary | ICD-10-CM | POA: Diagnosis not present

## 2022-08-16 DIAGNOSIS — H524 Presbyopia: Secondary | ICD-10-CM | POA: Diagnosis not present

## 2022-08-16 DIAGNOSIS — H5203 Hypermetropia, bilateral: Secondary | ICD-10-CM | POA: Diagnosis not present

## 2022-08-21 ENCOUNTER — Ambulatory Visit: Payer: Medicare Other | Admitting: Nurse Practitioner

## 2022-08-22 ENCOUNTER — Encounter: Payer: Self-pay | Admitting: Internal Medicine

## 2022-08-27 ENCOUNTER — Telehealth: Payer: Self-pay

## 2022-08-27 NOTE — Telephone Encounter (Signed)
LM-08/27/22-Chart review started, Reviewing OV, Consults, Hospital visits, Labs and medication changes. Chart review complete for General Review Call. Will call patient at another time to complete.  Total time spent: 25 min.

## 2022-08-28 ENCOUNTER — Ambulatory Visit (INDEPENDENT_AMBULATORY_CARE_PROVIDER_SITE_OTHER): Payer: Medicare Other | Admitting: *Deleted

## 2022-08-28 ENCOUNTER — Telehealth: Payer: Self-pay

## 2022-08-28 DIAGNOSIS — M8000XG Age-related osteoporosis with current pathological fracture, unspecified site, subsequent encounter for fracture with delayed healing: Secondary | ICD-10-CM | POA: Diagnosis not present

## 2022-08-28 MED ORDER — ROMOSOZUMAB-AQQG 105 MG/1.17ML ~~LOC~~ SOSY
210.0000 mg | PREFILLED_SYRINGE | Freq: Once | SUBCUTANEOUS | Status: AC
  Administered 2022-08-28: 210 mg via SUBCUTANEOUS

## 2022-08-28 NOTE — Telephone Encounter (Signed)
LM-08/28/22-Pt. Returned call and I informed her I was reaching out to complete general review call which entails a few questions regarding her health. Pt. Stated "I am actually late for a doctors appointment, can I call back at another time?" Told pt. No rush, she can return call whenever she gets a chance. Pt. Verbalized understanding and agreed to c/b at another time.  Total time spent: 4 min.

## 2022-08-28 NOTE — Telephone Encounter (Signed)
LM-08/28/22-Called pt. To inform her per CP she has no other recommendations for neuropathy because all other treatments cause dizziness as well, and to discuss with her neurologist. Unable to reach pt. Left detailed message informing her to consult w/ her neuro per CP and advised pt. To call if she has any further questions or concerns.   Total time spent: 3 min.

## 2022-08-28 NOTE — Progress Notes (Signed)
Patient is here for nurse visit for her 6th Evenity injection. Patient received bilateral Gloster arm injections. She tolerated injections well. She will return in 1 month for her next Evenity injection.

## 2022-08-28 NOTE — Telephone Encounter (Signed)
LM-08/28/22-Pt. Returned call and completed general review call.  Pt. stated she is doing okay other than constant numbness in her hands and both feet that she acquired after having sepsis from a UTI back on 06/22/22 and would like to know if CP has any recommendations for an alternative med for Gabapentin d/t dizziness. Informed pt. I will send report to CP for review and will call her back. Pt. Verbalized understanding and agreed.  Total time spent: 18 min.

## 2022-08-29 DIAGNOSIS — M810 Age-related osteoporosis without current pathological fracture: Secondary | ICD-10-CM | POA: Diagnosis not present

## 2022-08-29 DIAGNOSIS — M542 Cervicalgia: Secondary | ICD-10-CM | POA: Diagnosis not present

## 2022-08-29 DIAGNOSIS — M25511 Pain in right shoulder: Secondary | ICD-10-CM | POA: Diagnosis not present

## 2022-08-31 NOTE — Progress Notes (Signed)
Office Visit Note  Patient: Debbie Houston             Date of Birth: [birth year]           MRN: [id number]             PCP: Unk Pinto, MD Referring: Unk Pinto, MD Visit Date: 09/14/2022 Occupation: @GUAROCC @  Subjective:  Pain in joints  History of Present Illness: Debbie Houston is a 81 y.o. female with history of osteoarthritis and degenerative disc disease.  She states she continues to have pain and discomfort in her bilateral hands and her bilateral feet.  She feels tingling on the tip of her fingers and her toes.  She also has nocturnal pain in her hands at times.  Her right total hip replacement is doing well.  She continues to have neck and lower back pain.  She states she had a reaction to Remicade infusions and she discontinued Remicade infusions.  She is on Asacol now and is followed by [facility] gastroenterologist.  She had a bone density in 2022 which showed osteoporosis.  She states currently she is on combination of Evenity and Prolia by Dr. Layne Benton.  She is on calcium and vitamin D.  She is walking for exercise.  Activities of Daily Living:  Patient reports morning stiffness for 5 minutes.   Patient Reports nocturnal pain.  Difficulty dressing/grooming: Denies Difficulty climbing stairs: Reports Difficulty getting out of chair: Reports Difficulty using hands for taps, buttons, cutlery, and/or writing: Reports  Review of Systems  Constitutional:  Positive for fatigue.  HENT:  Negative for mouth sores and mouth dryness.   Eyes:  Positive for dryness.  Respiratory:  Negative for shortness of breath.   Cardiovascular:  Negative for chest pain and palpitations.  Gastrointestinal:  Positive for diarrhea. Negative for blood in stool and constipation.  Endocrine: Negative for increased urination.  Genitourinary:  Negative for involuntary urination.  Musculoskeletal:  Positive for joint pain, gait problem, joint pain, myalgias, morning stiffness and myalgias.  Skin:   Negative for color change, rash and sensitivity to sunlight.  Allergic/Immunologic: Negative for susceptible to infections.  Neurological:  Positive for dizziness and numbness. Negative for headaches.  Hematological:  Negative for swollen glands.  Psychiatric/Behavioral:  Negative for depressed mood and sleep disturbance. The patient is nervous/anxious.     PMFS History:  Patient Active Problem List   Diagnosis Date Noted   Sepsis secondary to UTI ([place]) 06/24/2022   Extensor tenosynovitis of right wrist 83/41/9622   Acute metabolic encephalopathy 29/79/8921   Hypokalemia 06/23/2022   Thrombocytopenia ([place]) 06/23/2022   Prediabetes 06/23/2022   Intertrochanteric fracture of right femur, closed, with nonunion, subsequent encounter 01/22/2022   History of ulcerative colitis 07/10/2021   Complete right bundle branch block (RBBB) 05/15/2021   Body mass index (BMI) of 19.0-19.9 in adult 05/12/2021   Bilateral nephrolithiasis 09/21/2020   Senile purpura ([place]) 08/17/2020   Bilateral temporomandibular joint pain 06/15/2020   Vertigo 03/25/2019   Spondylolisthesis at L4-L5 level 11/25/2018   Inflammatory bowel disease 12/21/2014   Medication management 04/20/2014   Essential hypertension    Hyperlipidemia, mixed    Hypothyroid    GERD    Asthma    DJD (degenerative joint disease)    Vitamin D deficiency    Osteoporosis with current pathological fracture    Allergy     Past Medical History:  Diagnosis Date   Arthritis    Benign labile hypertension    Cancer ([place])  skin cancer on face   Colitis    DJD (degenerative joint disease)    Endometrial polyp    GERD (gastroesophageal reflux disease)    History of basal cell carcinoma excision    History of esophagitis    HOH (hard of hearing)    right ear better than left  -- wears no aides   Hyperlipidemia    Hypothyroidism    Neuromuscular disorder ([place])    neuropathy in feet   Osteopenia    Prediabetes    S/P right hip  fracture 08/17/2020   Vitamin D deficiency    Wears glasses     Family History  Problem Relation Age of Onset   Hypertension Mother    Cancer Mother        COLON   Hyperlipidemia Mother    Cancer Father        BREAST WITH BRAIN METS   Hyperlipidemia Father    Cancer Sister        COLON (TEENS), BREAST (22)   Hyperlipidemia Sister    Diabetes Brother    Hyperlipidemia Brother    Hypertension Brother    Colon cancer Brother 73   Cancer Maternal Grandmother        RENAL   Hyperlipidemia Maternal Grandmother    Diabetes Maternal Grandfather    Stroke Maternal Grandfather    Hyperlipidemia Maternal Grandfather    Hyperlipidemia Paternal Grandmother    Hyperlipidemia Paternal Grandfather    Autism Son    Colitis Son    Autism spectrum disorder Son    Healthy Son    Past Surgical History:  Procedure Laterality Date   BACK SURGERY  2020   CATARACT EXTRACTION, BILATERAL Bilateral 2020   CONVERSION TO TOTAL HIP Right 01/22/2022   Procedure: CONVERSION TO TOTAL HIP;  Surgeon: Willaim Sheng, MD;  Location: [facility];  Service: Orthopedics;  Laterality: Right;   DILATATION & CURETTAGE/HYSTEROSCOPY WITH MYOSURE N/A 10/27/2014   Procedure: DILATATION & CURETTAGE/HYSTEROSCOPY WITH MYOSURE;  Surgeon: Darlyn Chamber, MD;  Location: [facility];  Service: Gynecology;  Laterality: N/A;   DILATION AND CURETTAGE OF UTERUS     HIP SURGERY Right 04/2020   rod and screw in right hip    MOHS SURGERY  2005   LEFT NASAL BRIDGE FOR BASAL CELL   TONSILLECTOMY  age 69   Social History   Social History Narrative   Not on file   Immunization History  Administered Date(s) Administered   Influenza, High Dose Seasonal PF 07/23/2014, 08/23/2015, 07/10/2016, 08/27/2017, 08/22/2018, 08/25/2019, 08/30/2020, 08/21/2021   PFIZER(Purple Top)SARS-COV-2 Vaccination 12/10/2019, 12/31/2019, 08/16/2020, 04/14/2021   Pneumococcal Conjugate-13 05/05/2015   Pneumococcal-Unspecified 08/22/2011    Tdap 08/22/2011   Zoster Recombinat (Shingrix) 09/13/2017, 01/01/2018   Zoster, Live 06/20/2006     Objective: Vital Signs: BP (!) 155/84 (BP Location: Left Arm, Patient Position: Sitting, Cuff Size: Normal)   Pulse 66   Resp 13   Ht 5' 5"  (1.651 m)   Wt 110 lb 3.2 oz (50 kg)   BMI 18.34 kg/m    Physical Exam Vitals and nursing note reviewed.  Constitutional:      Appearance: She is well-developed.  HENT:     Head: Normocephalic and atraumatic.  Eyes:     Conjunctiva/sclera: Conjunctivae normal.  Cardiovascular:     Rate and Rhythm: Normal rate and regular rhythm.     Heart sounds: Normal heart sounds.  Pulmonary:     Effort: Pulmonary effort is normal.  Breath sounds: Normal breath sounds.  Abdominal:     General: Bowel sounds are normal.     Palpations: Abdomen is soft.  Musculoskeletal:     Cervical back: Normal range of motion.  Lymphadenopathy:     Cervical: No cervical adenopathy.  Skin:    General: Skin is warm and dry.     Capillary Refill: Capillary refill takes less than 2 seconds.  Neurological:     Mental Status: She is alert and oriented to person, place, and time.  Psychiatric:        Behavior: Behavior normal.      Musculoskeletal Exam: Limited lateral rotation of the cervical spine.  She has painful limited range of motion of the lumbar spine.  Shoulder joints, elbow joints, wrist joints with good range of motion.  She had bilateral PIP and DIP thickening with subluxation of several of her DIP joints.  Right hip joint is replaced.  Hip joints and knee joints with good range of motion.  There was no tenderness over ankles.  She had PIP and DIP thickening in her feet consistent with osteoarthritis.  CDAI Exam: CDAI Score: -- Patient Global: --; Provider Global: -- Swollen: --; Tender: -- Joint Exam 09/14/2022   No joint exam has been documented for this visit   There is currently no information documented on the homunculus. Go to the  Rheumatology activity and complete the homunculus joint exam.  Investigation: No additional findings.  Imaging: No results found.  Recent Labs: Lab Results  Component Value Date   WBC 4.6 07/10/2022   HGB 10.5 (L) 07/10/2022   PLT 266 07/10/2022   NA 137 06/27/2022   K 3.8 06/27/2022   CL 103 06/27/2022   CO2 27 06/27/2022   GLUCOSE 119 (H) 06/27/2022   BUN 13 06/27/2022   CREATININE 0.71 06/27/2022   BILITOT 0.3 06/27/2022   ALKPHOS 42 06/22/2022   AST 19 06/27/2022   ALT 14 06/27/2022   PROT 6.0 (L) 06/27/2022   ALBUMIN 3.4 (L) 06/22/2022   CALCIUM 9.0 06/27/2022   GFRAA 86 05/15/2021    Speciality Comments: No specialty comments available.  Procedures:  No procedures performed Allergies: Remicade [infliximab], Fosamax [alendronate sodium], Singulair [montelukast sodium], Wellbutrin [bupropion], Clindamycin/lincomycin, and Sulfa antibiotics   Assessment / Plan:     Visit Diagnoses: Positive ANA (antinuclear antibody) -  - 10/22/19: ANA 1:80 NS, 01/06/20: ENA negative.  She has no clinical features of autoimmune disease.  There is no history of oral ulcers, nasal ulcers, malar rash, photosensitivity, Raynaud's phenomenon or lymphadenopathy.  Primary osteoarthritis of both hands-he has severe osteoarthritis involving bilateral hands.  She had bilateral PIP and DIP thickening and CMC subluxation.  She has subluxation of several of her DIP joints.  She has been experiencing numbness on the tip of her fingers most likely due to severe osteoarthritis.  Joint protection muscle strengthening was discussed.  A handout on hand exercises was given.  She is taking Tylenol on as needed basis.  She was referred to physical therapy by Dr. Layne Benton.  She complains of numbness in her fingertips.  She has been referred to neurology by her PCP.  She denies typical symptoms of carpal tunnel syndrome.  S/P hip replacement, right - Dr. Zachery Dakins.  She had good range of motion of her right hip  joint.  Primary osteoarthritis of both feet-bilateral PIP and DIP thickening.  She continues rhythm discomfort in her feet.  Proper fitting shoes were advised.  Gait instability-she has some  gait instability.  She related to lower extremity weakness.  She has an appointment coming up with the physical therapy.  DDD (degenerative disc disease), cervical-she had limited lateral rotation of the cervical spine.  She continues to have some C-spine discomfort.  Spondylolisthesis at L4-L5 level -she is followed by Dr. Ellene Route.  She has chronic discomfort in her lower back.  Core strengthening exercises were discussed.  Age-related osteoporosis without current pathological fracture -I reviewed her DEXA scan from December 26, 2020 T score was -2.5 BMD 0.569 in the lumbar spine.  She has been on Prolia injections and Evenity by Dr. Layne Benton.  Calcium rich diet and daily exercise was emphasized.  Vitamin D deficiency  Abnormal SPEP  History of ulcerative colitis-patient states that She had a reaction to Remicade and it was discontinued.  She is seeing [facility] gastroenterologist.  She is on Asacol.  Essential hypertension  History of hyperlipidemia  History of gastroesophageal reflux (GERD)  History of asthma  History of hypothyroidism  Orders: No orders of the defined types were placed in this encounter.  No orders of the defined types were placed in this encounter.    Follow-Up Instructions: Return in about 6 months (around 03/16/2023) for Osteoarthritis.   Bo Merino, MD  Note - This record has been created using Editor, commissioning.  Chart creation errors have been sought, but may not always  have been located. Such creation errors do not reflect on  the standard of medical care.

## 2022-09-04 ENCOUNTER — Other Ambulatory Visit: Payer: Self-pay | Admitting: Nurse Practitioner

## 2022-09-04 DIAGNOSIS — R202 Paresthesia of skin: Secondary | ICD-10-CM

## 2022-09-05 DIAGNOSIS — M25551 Pain in right hip: Secondary | ICD-10-CM | POA: Diagnosis not present

## 2022-09-05 DIAGNOSIS — M170 Bilateral primary osteoarthritis of knee: Secondary | ICD-10-CM | POA: Diagnosis not present

## 2022-09-14 ENCOUNTER — Encounter: Payer: Self-pay | Admitting: Rheumatology

## 2022-09-14 ENCOUNTER — Ambulatory Visit: Payer: Medicare Other | Attending: Rheumatology | Admitting: Rheumatology

## 2022-09-14 VITALS — BP 155/84 | HR 66 | Resp 13 | Ht 65.0 in | Wt 110.2 lb

## 2022-09-14 DIAGNOSIS — R778 Other specified abnormalities of plasma proteins: Secondary | ICD-10-CM

## 2022-09-14 DIAGNOSIS — R768 Other specified abnormal immunological findings in serum: Secondary | ICD-10-CM

## 2022-09-14 DIAGNOSIS — Z8719 Personal history of other diseases of the digestive system: Secondary | ICD-10-CM

## 2022-09-14 DIAGNOSIS — M4316 Spondylolisthesis, lumbar region: Secondary | ICD-10-CM | POA: Diagnosis not present

## 2022-09-14 DIAGNOSIS — M8589 Other specified disorders of bone density and structure, multiple sites: Secondary | ICD-10-CM

## 2022-09-14 DIAGNOSIS — M810 Age-related osteoporosis without current pathological fracture: Secondary | ICD-10-CM | POA: Diagnosis not present

## 2022-09-14 DIAGNOSIS — Z96641 Presence of right artificial hip joint: Secondary | ICD-10-CM

## 2022-09-14 DIAGNOSIS — Z8709 Personal history of other diseases of the respiratory system: Secondary | ICD-10-CM | POA: Diagnosis not present

## 2022-09-14 DIAGNOSIS — L03011 Cellulitis of right finger: Secondary | ICD-10-CM

## 2022-09-14 DIAGNOSIS — M19072 Primary osteoarthritis, left ankle and foot: Secondary | ICD-10-CM | POA: Diagnosis not present

## 2022-09-14 DIAGNOSIS — E559 Vitamin D deficiency, unspecified: Secondary | ICD-10-CM | POA: Diagnosis not present

## 2022-09-14 DIAGNOSIS — M5030 Other cervical disc degeneration, unspecified cervical region: Secondary | ICD-10-CM | POA: Diagnosis not present

## 2022-09-14 DIAGNOSIS — M19042 Primary osteoarthritis, left hand: Secondary | ICD-10-CM | POA: Insufficient documentation

## 2022-09-14 DIAGNOSIS — R2681 Unsteadiness on feet: Secondary | ICD-10-CM

## 2022-09-14 DIAGNOSIS — Z8639 Personal history of other endocrine, nutritional and metabolic disease: Secondary | ICD-10-CM | POA: Diagnosis not present

## 2022-09-14 DIAGNOSIS — M19041 Primary osteoarthritis, right hand: Secondary | ICD-10-CM

## 2022-09-14 DIAGNOSIS — M19071 Primary osteoarthritis, right ankle and foot: Secondary | ICD-10-CM | POA: Diagnosis not present

## 2022-09-14 DIAGNOSIS — I10 Essential (primary) hypertension: Secondary | ICD-10-CM | POA: Diagnosis not present

## 2022-09-14 NOTE — Patient Instructions (Signed)
Hand Exercises Hand exercises can be helpful for almost anyone. These exercises can strengthen the hands, improve flexibility and movement, and increase blood flow to the hands. These results can make work and daily tasks easier. Hand exercises can be especially helpful for people who have joint pain from arthritis or have nerve damage from overuse (carpal tunnel syndrome). These exercises can also help people who have injured a hand. Exercises Most of these hand exercises are gentle stretching and motion exercises. It is usually safe to do them often throughout the day. Warming up your hands before exercise may help to reduce stiffness. You can do this with gentle massage or by placing your hands in warm water for 10-15 minutes. It is normal to feel some stretching, pulling, tightness, or mild discomfort as you begin new exercises. This will gradually improve. Stop an exercise right away if you feel sudden, severe pain or your pain gets worse. Ask your health care provider which exercises are best for you. Knuckle bend or "claw" fist  Stand or sit with your arm, hand, and all five fingers pointed straight up. Make sure to keep your wrist straight during the exercise. Gently bend your fingers down toward your palm until the tips of your fingers are touching the top of your palm. Keep your big knuckle straight and just bend the small knuckles in your fingers. Hold this position for __________ seconds. Straighten (extend) your fingers back to the starting position. Repeat this exercise 5-10 times with each hand. Full finger fist  Stand or sit with your arm, hand, and all five fingers pointed straight up. Make sure to keep your wrist straight during the exercise. Gently bend your fingers into your palm until the tips of your fingers are touching the middle of your palm. Hold this position for __________ seconds. Extend your fingers back to the starting position, stretching every joint fully. Repeat  this exercise 5-10 times with each hand. Straight fist Stand or sit with your arm, hand, and all five fingers pointed straight up. Make sure to keep your wrist straight during the exercise. Gently bend your fingers at the big knuckle, where your fingers meet your hand, and the middle knuckle. Keep the knuckle at the tips of your fingers straight and try to touch the bottom of your palm. Hold this position for __________ seconds. Extend your fingers back to the starting position, stretching every joint fully. Repeat this exercise 5-10 times with each hand. Tabletop  Stand or sit with your arm, hand, and all five fingers pointed straight up. Make sure to keep your wrist straight during the exercise. Gently bend your fingers at the big knuckle, where your fingers meet your hand, as far down as you can while keeping the small knuckles in your fingers straight. Think of forming a tabletop with your fingers. Hold this position for __________ seconds. Extend your fingers back to the starting position, stretching every joint fully. Repeat this exercise 5-10 times with each hand. Finger spread  Place your hand flat on a table with your palm facing down. Make sure your wrist stays straight as you do this exercise. Spread your fingers and thumb apart from each other as far as you can until you feel a gentle stretch. Hold this position for __________ seconds. Bring your fingers and thumb tight together again. Hold this position for __________ seconds. Repeat this exercise 5-10 times with each hand. Making circles  Stand or sit with your arm, hand, and all five fingers pointed  straight up. Make sure to keep your wrist straight during the exercise. Make a circle by touching the tip of your thumb to the tip of your index finger. Hold for __________ seconds. Then open your hand wide. Repeat this motion with your thumb and each finger on your hand. Repeat this exercise 5-10 times with each hand. Thumb  motion  Sit with your forearm resting on a table and your wrist straight. Your thumb should be facing up toward the ceiling. Keep your fingers relaxed as you move your thumb. Lift your thumb up as high as you can toward the ceiling. Hold for __________ seconds. Bend your thumb across your palm as far as you can, reaching the tip of your thumb for the small finger (pinkie) side of your palm. Hold for __________ seconds. Repeat this exercise 5-10 times with each hand. Grip strengthening  Hold a stress ball or other soft ball in the middle of your hand. Slowly increase the pressure, squeezing the ball as much as you can without causing pain. Think of bringing the tips of your fingers into the middle of your palm. All of your finger joints should bend when doing this exercise. Hold your squeeze for __________ seconds, then relax. Repeat this exercise 5-10 times with each hand. Contact a health care provider if: Your hand pain or discomfort gets much worse when you do an exercise. Your hand pain or discomfort does not improve within 2 hours after you exercise. If you have any of these problems, stop doing these exercises right away. Do not do them again unless your health care provider says that you can. Get help right away if: You develop sudden, severe hand pain or swelling. If this happens, stop doing these exercises right away. Do not do them again unless your health care provider says that you can. This information is not intended to replace advice given to you by your health care provider. Make sure you discuss any questions you have with your health care provider. Document Revised: 02/23/2021 Document Reviewed: 02/23/2021 Elsevier Patient Education  [place].

## 2022-09-15 DIAGNOSIS — Z8619 Personal history of other infectious and parasitic diseases: Secondary | ICD-10-CM | POA: Diagnosis not present

## 2022-09-15 DIAGNOSIS — I10 Essential (primary) hypertension: Secondary | ICD-10-CM | POA: Diagnosis not present

## 2022-09-17 NOTE — Progress Notes (Unsigned)
MEDICARE ANNUAL WELLNESS VISIT AND FOLLOW UP  Assessment:   Debbie Houston was seen today for follow-up and medicare wellness.  Diagnoses and all orders for this visit:  Encounter for Medicare annual wellness exam Yearly  Essential hypertension - continue medications, DASH diet, exercise and monitor at home. Call if greater than 272/53.  - CBC  Uncomplicated asthma, unspecified asthma severity, unspecified whether persistent Doing well on current regimen, continue with benefit Continue to monitor  Colitis Having worsening episodes of diarrhea Continue to follow with Dr. Laverta Baltimore  Senile Purpura(HCC) Has followed with Dr Estanislado Pandy  GERD Continue PPI/H2 blocker, diet discussed  Hypothyroidism, unspecified type Last TSH elevated Will change dosage to 166mg 1 tab T-TH-Sat and 1/2 tab the other days -TSH  Osteoarthritis, unspecified osteoarthritis type, unspecified site Doing well, tylenol PRN, Dr. CFrench Anafollows  Osteoporosis with delayed healing of right intertrochanteric fracture of femur Continue Calcium & Vit D Dr. BLayne Bentonfollowing for prolia, she is to be starting a new injection for bone growth but had to wait until impetigo infection resolved. 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 HDoreatha MartinCurrently using bone stimulator  Complete Right Bundle Branch Block Monitor  Spondylolisthesis at L4-L5 level S/P lumbar fusion.  Doing well Following with Dr EEllene Route Mixed hyperlipidemia Continue current regimen Discussed dietary and exercise modifications  - CMP, Lipid panel  Vitamin D deficiency Continue Vit D supplementation  Vertigo Monitor; meclizine PRN if needed  Urinary urgency - Routine UA with reflex microscopic - Urine culture Will treat pending culture results     Over 40 minutes of exam, counseling, chart review and critical decision  making was performed Future Appointments  Date Time Provider DMadison 09/21/2022  2:50 PM PNarda AmberK, DO [facility] None  09/28/2022 10:50 AM SRosemarie Ax MD [facility] 12/25/2022 10:30 AM MUnk Pinto MD [facility] None  03/14/2023  1:40 PM DBo Merino MD [facility] None  04/29/2023 10:30 AM TRex Kras DO [facility] None  06/25/2023  2:00 PM MUnk Pinto MD [facility] None     Plan:   During the course of the visit the patient was educated and counseled about appropriate screening and preventive services including:   Pneumococcal vaccine  Prevnar 13 Influenza vaccine Td vaccine Screening electrocardiogram Bone densitometry screening Colorectal cancer screening Diabetes screening Glaucoma screening Nutrition counseling  Advanced directives: requested   Subjective:  Debbie BOUSKAis a 81y.o. female who presents for Medicare Annual Wellness Visit and 3 month follow up.   She had fall with R hip/fremur fracture in June 2021 on vacation at the beach, had IM nail placed. She has displaced intertrochanteric fracture of right femur,  closed fracture with nonunion The bone did not heal correctly and is currently undergoing treatments with bone stimulator and following with Dr. HDoreatha Martin bone stimulator currently, may require an additional surgery in the future  She had a DEXA 12/26/20 that showed left femoral neck with T -2.5. Following with Dr. BLayne Bentonon prolia. She is also doing eventia once a month.  She has been noticing urinary urgency, denies hematuria and dysuria.  She is worried for UTI.  Pt had abnormal SPEP, was seen by Dr. FBurr Medico no signs of multiple myeloma. Continues to follow.  [facility] and [facility] out Crohns. Continues to have episodes of diarrhea - uses Immodium, having multiple episodes daily.  Continues to Have neck pain that radiates into head. Tylenol will help some  Following with Dr.  Davy Pique.  She continues to have burning and numbness in hands, legs, feet. Gabapentin did not help   BMI is Body mass index is 19.19 kg/m., she has been working on diet and exercise. Wt Readings from Last 3 Encounters:  09/18/22 111 lb 12.8 oz (50.7 kg)  09/14/22 110 lb 3.2 oz (50 kg)  08/06/22 110 lb 6.4 oz (50.1 kg)    Her blood pressure has been controlled at home runs 100-130/70-80's, today their BP is BP: 126/80  BP Readings from Last 3 Encounters:  09/18/22 126/80  09/14/22 (!) 155/84  08/06/22 122/70  She does workout. She denies chest pain, shortness of breath, dizziness.   She is on cholesterol medication (simvastatin 20 mg, cholestipol 2 g daily) and denies myalgias. Her cholesterol is at goal. The cholesterol last visit was:   Lab Results  Component Value Date   CHOL 132 06/18/2022   HDL 59 06/18/2022   LDLCALC 59 06/18/2022   TRIG 64 06/18/2022   CHOLHDL 2.2 06/18/2022   . She has been working on diet and exercise. and denies Last A1C in the office was:  Lab Results  Component Value Date   HGBA1C 5.7 (H) 06/18/2022   Last GFR: Lab Results  Component Value Date   GFRNONAA >60 06/24/2022   She is on thyroid medication. Currently taking Levothyroxine 125 mcg 1 tab Tues and Thurs and 1/2 tab the other days Lab Results  Component Value Date   TSH 2.22 06/18/2022   Patient is on Vitamin D supplement.   Lab Results  Component Value Date   VD25OH 78 06/18/2022      Medication Review: Current Outpatient Medications on File Prior to Visit  Medication Sig Dispense Refill   acetaminophen (TYLENOL) 500 MG tablet Take 500 mg by mouth at bedtime as needed for moderate pain or headache.      Ascorbic Acid (VITAMIN C) 1000 MG tablet Take 1,000 mg by mouth daily.      aspirin EC 81 MG tablet Take 1 tablet (81 mg total) by mouth in the morning and at bedtime. (Patient taking differently: Take 81 mg by mouth daily.) 60 tablet 0   bisoprolol (ZEBETA) 5 MG tablet TAKE 1/2  TABLET BY MOUTH EVERY DAY IN THE EVENING FOR BLOOD PRESSURE 45 tablet 3   Calcium Carbonate-Vit D-Min (CALCIUM 1200 PO) Take 1,200 mg by mouth daily.     CINNAMON PO Take 2,000 mg by mouth daily.     Coenzyme Q10 (COQ10) 200 MG CAPS Take 200 mg by mouth daily.     colestipol (COLESTID) 1 g tablet Take 4 g by mouth daily.     cycloSPORINE (RESTASIS) 0.05 % ophthalmic emulsion Place 1 drop into both eyes 2 (two) times daily.     gabapentin (NEURONTIN) 100 MG capsule TAKE 2 CAPSULES BY MOUTH AT BEDTIME (Patient taking differently: Take 100 mg by mouth at bedtime. TAKE 2 CAPSULES BY MOUTH AT BEDTIME) 60 capsule 0   levothyroxine (SYNTHROID) 125 MCG tablet TAKE 1 TABLET BY MOUTH DAILY ON AN EMPTY STOMACH WITH ONLY WATER FOR 30 MINUTES(NO ANTACIDS MEDICATIONS, CALCIUM OR MAGNESIUM FOR 4 HOURS) (Patient taking differently: Take 125 mcg by mouth daily before breakfast.) 90 tablet 0   loperamide (IMODIUM) 2 MG capsule Take 4 mg by mouth 2 (two) times daily.     loratadine (CLARITIN) 10 MG tablet Take 10 mg by mouth  daily as needed for allergies.     Mesalamine (ASACOL PO) Take by mouth. Take 2 tablets once daily     Multiple Vitamin (THERA VITAMIN PO) Take 1 tablet by mouth daily.     Omega-3 Fatty Acids (FISH OIL) 1200 MG CAPS Take 1,200 mg by mouth daily.      pantoprazole (PROTONIX) 40 MG tablet Take 1 tablet  2 x /day  for Heartburn & Indigestion (Patient taking differently: Take 40 mg by mouth 2 (two) times daily.) 180 tablet 0   Probiotic Product (PROBIOTIC PO) Take 1 capsule by mouth daily.     PROLIA 60 MG/ML SOSY injection Inject 60 mg into the skin every 6 (six) months.     pseudoephedrine (SUDAFED) 120 MG 12 hr tablet Take  1 tablet  2 x /day (every 12 hours)  for Head and Chest Congestion (Patient taking differently: Take 120 mg by mouth daily as needed for congestion (Head and chest congestion).) 60 tablet 0   simvastatin (ZOCOR) 20 MG tablet Take  1 tablet  at Bedtime  for Cholesterol                                                  /                        TAKE             BY MOUTH (Patient taking differently: Take 20 mg by mouth daily at 6 PM.) 90 tablet 3   rOPINIRole (REQUIP) 0.25 MG tablet Take 1 tablet (0.25 mg total) by mouth 3 (three) times daily. (Patient not taking: Reported on 09/18/2022) 90 tablet 0   Current Facility-Administered Medications on File Prior to Visit  Medication Dose Route Frequency Provider Last Rate Last Admin   inFLIXimab (REMICADE) 4.02 mg/kg = 200 mg in sodium chloride 0.9 % 250 mL infusion  4.02 mg/kg Intravenous Once Unk Pinto, MD        Allergies  Allergen Reactions   Remicade [Infliximab]    Fosamax [Alendronate Sodium] Other (See Comments)    GI upset    Singulair [Montelukast Sodium] Other (See Comments)    Makes pt jittery   Wellbutrin [Bupropion] Hives   Clindamycin/Lincomycin Rash   Sulfa Antibiotics Rash    Current Problems (verified) Patient Active Problem List   Diagnosis Date Noted   Sepsis secondary to UTI ([place]) 06/24/2022   Extensor tenosynovitis of right wrist 69/62/9528   Acute metabolic encephalopathy 41/32/4401   Hypokalemia 06/23/2022   Thrombocytopenia ([place]) 06/23/2022   Prediabetes 06/23/2022   Intertrochanteric fracture of right femur, closed, with nonunion, subsequent encounter 01/22/2022   History of ulcerative colitis 07/10/2021   Complete right bundle branch block (RBBB) 05/15/2021   Body mass index (BMI) of 19.0-19.9 in adult 05/12/2021   Bilateral nephrolithiasis 09/21/2020   Senile purpura ([place]) 08/17/2020   Bilateral temporomandibular joint pain 06/15/2020   Vertigo 03/25/2019   Spondylolisthesis at L4-L5 level 11/25/2018   Inflammatory bowel disease 12/21/2014   Medication management 04/20/2014   Essential hypertension    Hyperlipidemia, mixed    Hypothyroid    GERD    Asthma    DJD (degenerative joint disease)    Vitamin D deficiency    Osteoporosis with current pathological fracture     Allergy  Screening Tests Immunization History  Administered Date(s) Administered   Influenza, High Dose Seasonal PF 07/23/2014, 08/23/2015, 07/10/2016, 08/27/2017, 08/22/2018, 08/25/2019, 08/30/2020, 08/21/2021, 08/21/2022   Moderna SARS-COV2 Booster Vaccination 08/21/2022   PFIZER(Purple Top)SARS-COV-2 Vaccination 12/10/2019, 12/31/2019, 08/16/2020, 04/14/2021   Pneumococcal Conjugate-13 05/05/2015   Pneumococcal-Unspecified 08/22/2011   Tdap 08/22/2011   Zoster Recombinat (Shingrix) 09/13/2017, 01/01/2018   Zoster, Live 06/20/2006   Will get tetanus if develops a cut  Preventative Care: Last colonoscopy: 04/2019 repeat due 2025 Upper GI:04/2019 Last mammogram: 12/2021 neg, Hx Breast Cx. Last pap smear/pelvic exam: 2012 DEXA: 12/26/20 fem T-2.5, stable, planned in 2022 by Dr. Layne Benton  Prior vaccinations: TD or Tdap: 2012, DUE 2022  Influenza: 08/21/2021 Pneumococcal: 2012  Prevnar13: 2016 Shingrix: 2/2, 2019 Covid 19: 3/3, 2021, pfizer   Names of Other Physician/Practitioners you currently use: 1. [facility] here for primary care 2.Eye Exam 07/2022 3. Dentist 07/2022  Patient Care Team: Unk Pinto, MD as PCP - General (Internal Medicine) Jerrell Belfast, MD as Consulting Physician (Otolaryngology) Kristeen Miss, MD as Consulting Physician (Neurosurgery) Mammography, [place] (Diagnostic Radiology) Truitt Merle, MD as Consulting Physician (Hematology) Bo Merino, MD as Consulting Physician (Rheumatology) Unk Pinto, MD as Referring Physician (Internal Medicine) Lazaro Arms, RN as [place] [facility]  SURGICAL HISTORY She  has a past surgical history that includes Mohs surgery (2005); Tonsillectomy (age 26); Dilation and curettage of uterus; Dilatation & curettage/hysteroscopy with myosure (N/A, 10/27/2014); Back surgery (2020); Hip surgery (Right, 04/2020); Cataract extraction, bilateral (Bilateral,  2020); and Conversion to total hip (Right, 01/22/2022). FAMILY HISTORY Her family history includes Autism in her son; Autism spectrum disorder in her son; Cancer in her father, maternal grandmother, mother, and sister; Colitis in her son; Colon cancer (age of onset: 17) in her brother; Diabetes in her brother and maternal grandfather; Healthy in her son; Hyperlipidemia in her brother, father, maternal grandfather, maternal grandmother, mother, paternal grandfather, paternal grandmother, and sister; Hypertension in her brother and mother; Stroke in her maternal grandfather. SOCIAL HISTORY She  reports that she quit smoking about 21 years ago. Her smoking use included cigarettes. She has a 3.75 pack-year smoking history. She has never been exposed to tobacco smoke. She has never used smokeless tobacco. She reports that she does not currently use alcohol. She reports that she does not use drugs.   MEDICARE WELLNESS OBJECTIVES: Physical activity: Current Exercise Habits: The patient does not participate in regular exercise at present, Exercise limited by: orthopedic condition(s) Cardiac risk factors: Cardiac Risk Factors include: advanced age (>99mn, >>10women);hypertension;sedentary lifestyle Depression/mood screen:      09/18/2022   11:21 AM  Depression screen PHQ 2/9  Decreased Interest 0  Down, Depressed, Hopeless 0  PHQ - 2 Score 0    ADLs:     09/18/2022   11:21 AM 06/23/2022   11:00 AM  In your present state of health, do you have any difficulty performing the following activities:  Hearing? 0 1  Vision? 0 0  Difficulty concentrating or making decisions? 0 0  Walking or climbing stairs? 0 0  Dressing or bathing? 0 0  Doing errands, shopping? 0 0      Cognitive Testing  Alert? Yes  Normal Appearance?Yes  Oriented to person? Yes  Place? Yes   Time? Yes  Recall of three objects?  Yes  Can perform simple calculations? Yes  Displays appropriate judgment?Yes  Can read the correct  time from a watch face?Yes  EOL planning: Does Patient  Have a Medical Advance Directive?: Yes Type of Advance Directive: Healthcare Power of Attorney, Living will Does patient want to make changes to medical advance directive?: No - Patient declined Copy of [place] in Chart?: No - copy requested  Review of Systems  Constitutional:  Positive for malaise/fatigue and weight loss. Negative for chills, diaphoresis and fever.  HENT:  Negative for congestion, ear discharge, ear pain, nosebleeds, sinus pain, sore throat and tinnitus.   Eyes:  Negative for blurred vision, double vision, photophobia, pain, discharge and redness.  Respiratory:  Negative for cough, hemoptysis, sputum production, shortness of breath, wheezing and stridor.   Cardiovascular:  Negative for chest pain, palpitations, orthopnea, claudication, leg swelling and PND.  Gastrointestinal:  Positive for diarrhea. Negative for abdominal pain, blood in stool, constipation, heartburn, melena, nausea and vomiting.  Genitourinary:  Positive for urgency. Negative for dysuria, flank pain, frequency and hematuria.  Musculoskeletal:  Positive for back pain, joint pain (hands) and neck pain. Negative for falls and myalgias.       S/P lumbar fusion.  Skin:  Positive for rash. Negative for itching.       Continued senile purpura rash on right shin  Neurological:  Positive for weakness. Negative for dizziness, tingling, tremors, sensory change, speech change, focal weakness, seizures, loss of consciousness and headaches.  Endo/Heme/Allergies:  Negative for environmental allergies and polydipsia. Bruises/bleeds easily.  Psychiatric/Behavioral:  Positive for depression. Negative for hallucinations, memory loss, substance abuse and suicidal ideas. The patient is not nervous/anxious and does not have insomnia.      Objective:     Today's Vitals   09/18/22 1059  BP: 126/80  Pulse: 75  Temp: 97.7 F (36.5 C)  SpO2: 97%   Weight: 111 lb 12.8 oz (50.7 kg)  Height: 5' 4" (1.626 m)   Body mass index is 19.19 kg/m.  General appearance: thin pleasant female alert, no distress HEENT: normocephalic, sclerae anicteric, TMs pearly, nares patent, no discharge or erythema, pharynx normal Oral cavity: MMM, no lesions Neck: supple, no lymphadenopathy, no thyromegaly, no masses Heart: RRR, normal S1, S2, no murmurs Lungs: CTA bilaterally, no wheezes, rhonchi, or rales Abdomen: +bs, soft, non tender, non distended, no masses, no hepatomegaly, no splenomegaly Musculoskeletal: nontender, no swelling, no obvious deformity Extremities: no edema, no cyanosis, no clubbing Pulses: 2+ symmetric, upper and lower extremities, normal cap refill Neurological: alert, oriented x 3, CN2-12 intact, strength normal upper extremities and lower extremities, sensation normal throughout, DTRs 2+ throughout, no cerebellar signs, gait slow steady with cane  Psychiatric: normal affect, behavior normal, pleasant  Derm: no rashes, concerning lesions; has scattered small ecchymosis to bil upper extremities  Medicare Attestation I have personally reviewed: The patient's medical and social history Their use of alcohol, tobacco or illicit drugs Their current medications and supplements The patient's functional ability including ADLs,fall risks, home safety risks, cognitive, and hearing and visual impairment Diet and physical activities Evidence for depression or mood disorders  The patient's weight, height, BMI, and visual acuity have been recorded in the chart.  I have made referrals, counseling, and provided education to the patient based on review of the above and I have provided the patient with a written personalized care plan for preventive services.     Alycia Rossetti, NP   09/18/2022

## 2022-09-18 ENCOUNTER — Encounter: Payer: Self-pay | Admitting: Nurse Practitioner

## 2022-09-18 ENCOUNTER — Ambulatory Visit (INDEPENDENT_AMBULATORY_CARE_PROVIDER_SITE_OTHER): Payer: Medicare Other | Admitting: Nurse Practitioner

## 2022-09-18 VITALS — BP 126/80 | HR 75 | Temp 97.7°F | Ht 64.0 in | Wt 111.8 lb

## 2022-09-18 DIAGNOSIS — Z0001 Encounter for general adult medical examination with abnormal findings: Secondary | ICD-10-CM | POA: Diagnosis not present

## 2022-09-18 DIAGNOSIS — M4316 Spondylolisthesis, lumbar region: Secondary | ICD-10-CM | POA: Diagnosis not present

## 2022-09-18 DIAGNOSIS — R3915 Urgency of urination: Secondary | ICD-10-CM

## 2022-09-18 DIAGNOSIS — E782 Mixed hyperlipidemia: Secondary | ICD-10-CM | POA: Diagnosis not present

## 2022-09-18 DIAGNOSIS — R7309 Other abnormal glucose: Secondary | ICD-10-CM

## 2022-09-18 DIAGNOSIS — K2100 Gastro-esophageal reflux disease with esophagitis, without bleeding: Secondary | ICD-10-CM | POA: Diagnosis not present

## 2022-09-18 DIAGNOSIS — E559 Vitamin D deficiency, unspecified: Secondary | ICD-10-CM

## 2022-09-18 DIAGNOSIS — Z0000 Encounter for general adult medical examination without abnormal findings: Secondary | ICD-10-CM

## 2022-09-18 DIAGNOSIS — D692 Other nonthrombocytopenic purpura: Secondary | ICD-10-CM

## 2022-09-18 DIAGNOSIS — M159 Polyosteoarthritis, unspecified: Secondary | ICD-10-CM

## 2022-09-18 DIAGNOSIS — I10 Essential (primary) hypertension: Secondary | ICD-10-CM | POA: Diagnosis not present

## 2022-09-18 DIAGNOSIS — Z79899 Other long term (current) drug therapy: Secondary | ICD-10-CM

## 2022-09-18 DIAGNOSIS — E039 Hypothyroidism, unspecified: Secondary | ICD-10-CM

## 2022-09-18 DIAGNOSIS — I4510 Unspecified right bundle-branch block: Secondary | ICD-10-CM

## 2022-09-18 DIAGNOSIS — R6889 Other general symptoms and signs: Secondary | ICD-10-CM | POA: Diagnosis not present

## 2022-09-18 DIAGNOSIS — M8000XA Age-related osteoporosis with current pathological fracture, unspecified site, initial encounter for fracture: Secondary | ICD-10-CM

## 2022-09-18 NOTE — Patient Instructions (Signed)

## 2022-09-19 LAB — LIPID PANEL
Cholesterol: 150 mg/dL (ref <200)
HDL: 55 mg/dL (ref >=50)
LDL Cholesterol (Calc): 76 mg/dL (calc)
Non-HDL Cholesterol (Calc): 95 mg/dL (calc) (ref <130)
Total CHOL/HDL Ratio: 2.7 (calc) (ref <5.0)
Triglycerides: 108 mg/dL (ref <150)

## 2022-09-19 LAB — CBC WITH DIFFERENTIAL/PLATELET
Absolute Monocytes: 570 cells/uL (ref 200–950)
Basophils Absolute: 42 cells/uL (ref 0–200)
Basophils Relative: 0.7 %
Eosinophils Absolute: 18 cells/uL (ref 15–500)
Eosinophils Relative: 0.3 %
HCT: 35.6 % (ref 35.0–45.0)
Hemoglobin: 12 g/dL (ref 11.7–15.5)
Lymphs Abs: 1218 cells/uL (ref 850–3900)
MCH: 30.5 pg (ref 27.0–33.0)
MCHC: 33.7 g/dL (ref 32.0–36.0)
MCV: 90.4 fL (ref 80.0–100.0)
MPV: 10.3 fL (ref 7.5–12.5)
Monocytes Relative: 9.5 %
Neutro Abs: 4152 cells/uL (ref 1500–7800)
Neutrophils Relative %: 69.2 %
Platelets: 231 10*3/uL (ref 140–400)
RBC: 3.94 10*6/uL (ref 3.80–5.10)
RDW: 12.8 % (ref 11.0–15.0)
Total Lymphocyte: 20.3 %
WBC: 6 10*3/uL (ref 3.8–10.8)

## 2022-09-19 LAB — URINALYSIS, ROUTINE W REFLEX MICROSCOPIC
Bilirubin Urine: NEGATIVE
Glucose, UA: NEGATIVE
Hgb urine dipstick: NEGATIVE
Ketones, ur: NEGATIVE
Leukocytes,Ua: NEGATIVE
Nitrite: NEGATIVE
Protein, ur: NEGATIVE
Specific Gravity, Urine: 1.007 (ref 1.001–1.035)
pH: 6.5 (ref 5.0–8.0)

## 2022-09-19 LAB — COMPLETE METABOLIC PANEL WITH GFR
AG Ratio: 1.8 (calc) (ref 1.0–2.5)
ALT: 15 U/L (ref 6–29)
AST: 19 U/L (ref 10–35)
Albumin: 4.2 g/dL (ref 3.6–5.1)
Alkaline phosphatase (APISO): 81 U/L (ref 37–153)
BUN: 13 mg/dL (ref 7–25)
CO2: 26 mmol/L (ref 20–32)
Calcium: 9.4 mg/dL (ref 8.6–10.4)
Chloride: 100 mmol/L (ref 98–110)
Creat: 0.73 mg/dL (ref 0.60–0.95)
Globulin: 2.3 g/dL (calc) (ref 1.9–3.7)
Glucose, Bld: 91 mg/dL (ref 65–99)
Potassium: 5.1 mmol/L (ref 3.5–5.3)
Sodium: 133 mmol/L — ABNORMAL LOW (ref 135–146)
Total Bilirubin: 0.4 mg/dL (ref 0.2–1.2)
Total Protein: 6.5 g/dL (ref 6.1–8.1)
eGFR: 83 mL/min/{1.73_m2} (ref >=60)

## 2022-09-19 LAB — URINE CULTURE
MICRO NUMBER:: 14125352
Result:: NO GROWTH
SPECIMEN QUALITY:: ADEQUATE

## 2022-09-19 LAB — TSH: TSH: 0.48 mIU/L (ref 0.40–4.50)

## 2022-09-20 NOTE — Progress Notes (Signed)
[facility] Clinic Note - Initial Visit   Date: 09/21/2022   Debbie Houston MRN: [id number] DOB: 1941/09/07   Dear Dr. Melford Aase:  Thank you for your kind referral of Debbie Houston for consultation of bilateral hand pain. Although her history is well known to you, please allow [place] to reiterate it for the purpose of our medical record. The patient was to the clinic by self.    Debbie Houston is a 81 y.o. left-handed female with ulcerative colitis, hyperlipidemia, GERD, asthma, hypothyroidism, OA, cervical spondylosis, lumbar spondylolisthesis at L4-5 (followed by Dr. Ellene Route)  presenting for evaluation of bilateral hand pain and feet paresthesias  IMPRESSION/PLAN: Subacute onset of bilateral hand and feet paresthesias, need to evaluate for polyneuropathy/polyradiculoneuorpathy vs entrapment neuropathy with overlapping radiculopathy with EDX.    - NCS/EMG of the right arm and leg  - Start Cymbalta 65m daily.  Unable to tolerate gabapentin at 2049md  Return to clinic in 4 months  ------------------------------------------------------------- History of present illness: She received Remicade infusion for Crohn's colitis in July and following this developed UTI.  She was hospitalized in August for urosepsis.  While hospitalized, she began having numbness, tingling, and sharp shooting pain over the hands and feet.  Numbness is constant and she has episodic sharp pain.  She tried gabapentin 20069mut stopped this due to dizziness.   She complains of weakness with difficulty with opening jars/grip.  She has some imbalance, walks unassisted and does not use a cane. Over the past three months, symptoms remain unchanged.  She denies having any numbness/tingling prior to her hospitalization.   She lives at home with husband and high-functioning autistic son.   Out-side paper records, electronic medical record, and images have been reviewed where available and summarized  as:  Lab Results  Component Value Date   HGBA1C 5.7 (H) 06/18/2022   Lab Results  Component Value Date   VITTOIZTIWP80 998/22/2023   Lab Results  Component Value Date   TSH 0.48 09/18/2022   Lab Results  Component Value Date   ESRSEDRATE 35 (H) 06/23/2022    Past Medical History:  Diagnosis Date   Arthritis    Benign labile hypertension    [place]   Colitis    DJD (degenerative joint disease)    Endometrial polyp    GERD (gastroesophageal reflux disease)    History of basal cell carcinoma excision    History of esophagitis    HOH (hard of hearing)    right ear better than left  -- wears no aides   Hyperlipidemia    Hypothyroidism    [place]   Osteopenia    Prediabetes    S/P right hip fracture 08/17/2020   Vitamin D deficiency    Wears glasses     Past Surgical History:  Procedure Laterality Date   BACK SURGERY  2020   CATARACT EXTRACTION, BILATERAL Bilateral 2020   CONVERSION TO TOTAL HIP Right 01/22/2022   Procedure: CONVERSION TO TOTAL HIP;  Surgeon: MarWillaim ShengD;  Location: [facility];  Service: Orthopedics;  Laterality: Right;   DILATATION & CURETTAGE/HYSTEROSCOPY WITH MYOSURE N/A 10/27/2014   Procedure: DILATATION & CURETTAGE/HYSTEROSCOPY WITH MYOSURE;  Surgeon: JohDarlyn ChamberD;  [facility];  Laterality: N/A;   DILATION AND CURETTAGE OF UTERUS     HIP SURGERY Right 04/2020   rod and  screw in right hip    MOHS SURGERY  2005   LEFT NASAL BRIDGE FOR BASAL CELL   TONSILLECTOMY  age 19     Medications:  Outpatient Encounter Medications as of 09/21/2022  Medication Sig   acetaminophen (TYLENOL) 500 MG tablet Take 500 mg by mouth at bedtime as needed for moderate pain or headache.    Ascorbic Acid (VITAMIN C) 1000 MG tablet Take 1,000 mg by mouth daily.    aspirin EC 81 MG tablet Take 1 tablet (81 mg total) by mouth in the morning  and at bedtime. (Patient taking differently: Take 81 mg by mouth daily.)   bisoprolol (ZEBETA) 5 MG tablet TAKE 1/2 TABLET BY MOUTH EVERY DAY IN THE EVENING FOR BLOOD PRESSURE   Calcium Carbonate-Vit D-Min (CALCIUM 1200 PO) Take 1,200 mg by mouth daily.   CINNAMON PO Take 2,000 mg by mouth daily.   Coenzyme Q10 (COQ10) 200 MG CAPS Take 200 mg by mouth daily.   colestipol (COLESTID) 1 g tablet Take 4 g by mouth daily.   cycloSPORINE (RESTASIS) 0.05 % ophthalmic emulsion Place 1 drop into both eyes 2 (two) times daily.   levothyroxine (SYNTHROID) 125 MCG tablet TAKE 1 TABLET BY MOUTH DAILY ON AN EMPTY STOMACH WITH ONLY WATER FOR 30 MINUTES(NO ANTACIDS MEDICATIONS, CALCIUM OR MAGNESIUM FOR 4 HOURS) (Patient taking differently: Take 125 mcg by mouth daily before breakfast.)   loperamide (IMODIUM) 2 MG capsule Take 4 mg by mouth 2 (two) times daily.   loratadine (CLARITIN) 10 MG tablet Take 10 mg by mouth daily as needed for allergies.   Mesalamine (ASACOL PO) Take by mouth. Take 2 tablets once daily   Multiple Vitamin (THERA VITAMIN PO) Take 1 tablet by mouth daily.   Omega-3 Fatty Acids (FISH OIL) 1200 MG CAPS Take 1,200 mg by mouth daily.    pantoprazole (PROTONIX) 40 MG tablet Take 1 tablet  2 x /day  for Heartburn & Indigestion (Patient taking differently: Take 40 mg by mouth 2 (two) times daily.)   Probiotic Product (PROBIOTIC PO) Take 1 capsule by mouth daily.   PROLIA 60 MG/ML SOSY injection Inject 60 mg into the skin every 6 (six) months.   pseudoephedrine (SUDAFED) 120 MG 12 hr tablet Take  1 tablet  2 x /day (every 12 hours)  for Head and Chest Congestion (Patient taking differently: Take 120 mg by mouth daily as needed for congestion (Head and chest congestion).)   simvastatin (ZOCOR) 20 MG tablet Take  1 tablet  at Bedtime  for Cholesterol                                                 /                        TAKE             BY MOUTH (Patient taking differently: Take 20 mg by mouth daily  at 6 PM.)   gabapentin (NEURONTIN) 100 MG capsule TAKE 2 CAPSULES BY MOUTH AT BEDTIME (Patient not taking: Reported on 09/21/2022)   Facility-Administered Encounter Medications as of 09/21/2022  Medication   inFLIXimab (REMICADE) 4.02 mg/kg = 200 mg in sodium chloride 0.9 % 250 mL infusion    Allergies:  Allergies  Allergen Reactions   Remicade [Infliximab]    Fosamax [  Alendronate Sodium] Other (See Comments)    GI upset    Singulair [Montelukast Sodium] Other (See Comments)    Makes pt jittery   Wellbutrin [Bupropion] Hives   Clindamycin/Lincomycin Rash   Sulfa Antibiotics Rash    Family History: Family History  Problem Relation Age of Onset   Hypertension Mother    Cancer Mother        COLON   Hyperlipidemia Mother    Cancer Father        BREAST WITH BRAIN METS   Hyperlipidemia Father    Cancer Sister        COLON (TEENS), BREAST (62)   Hyperlipidemia Sister    Diabetes Brother    Hyperlipidemia Brother    Hypertension Brother    Colon cancer Brother 11   Cancer Maternal Grandmother        RENAL   Hyperlipidemia Maternal Grandmother    Diabetes Maternal Grandfather    Stroke Maternal Grandfather    Hyperlipidemia Maternal Grandfather    Hyperlipidemia Paternal Grandmother    Hyperlipidemia Paternal Grandfather    Autism Son    Colitis Son    Autism spectrum disorder Son    Healthy Son     Social History: Social History   Tobacco Use   Smoking status: Former    Packs/day: 0.25    Years: 15.00    Total pack years: 3.75    Types: Cigarettes    Quit date: 2002    Years since quitting: 21.8    Passive exposure: Never   Smokeless tobacco: Never   Tobacco comments:    Former light smoker, quit 15+ years ago, can't recall  Vaping Use   Vaping Use: Never used  Substance Use Topics   Alcohol use: Not Currently   Drug use: No   Social History   Social History Narrative   Are you right handed or left handed? Left Handed    Are you currently employed ?  No   What is your current occupation? No. Use to teacher [employer]    Do you live at home alone? No   Who lives with you? Lives with husband    What type of home do you live in: 1 story or 2 story? One story home               Vital Signs:  BP (!) 158/79   Pulse 81   Ht 5' 4"  (1.626 m)   Wt 112 lb (50.8 kg)   SpO2 97%   BMI 19.22 kg/m   Neurological Exam: MENTAL STATUS including orientation to time, place, person, recent and remote memory, attention span and concentration, language, and fund of knowledge is normal.  Speech is not dysarthric.  CRANIAL NERVES: II:  No visual field defects.    III-IV-VI: Pupils equal round and reactive to light.  Normal conjugate, extra-ocular eye movements in all directions of gaze.  No nystagmus.  No ptosis.   V:  Normal facial sensation.    VII:  Normal facial symmetry and movements.   VIII:  Normal hearing and vestibular function.   IX-X:  Normal palatal movement.   XI:  Normal shoulder shrug and head rotation.   XII:  Normal tongue strength and range of motion, no deviation or fasciculation.  MOTOR:  Generalized loss of muscle bulk throughout.  Bilateral ABP atrophy. No fasciculations or abnormal movements.  No pronator drift.   Upper Extremity:  Right  Left  Deltoid  5/5   5/5  Biceps  5/5   5/5   Triceps  5/5   5/5   Wrist extensors  5/5   5/5   Wrist flexors  5/5   5/5   Finger extensors  5/5   5/5   Finger flexors  5/5   5/5   Dorsal interossei  5/5   5/5   Abductor pollicis  4/5   4/5   Tone (Ashworth scale)  0  0   Lower Extremity:  Right  Left  Hip flexors  5/5   5/5   Knee flexors  5/5   5/5   Knee extensors  5/5   5/5   Dorsiflexors  5/5   5/5   Plantarflexors  5/5   5/5   Toe extensors  5/5   5/5   Toe flexors  5/5   5/5   Tone (Ashworth scale)  0  0   MSRs:                                           Right        Left brachioradialis 2+  2+  biceps 2+  2+  triceps 2+  2+  patellar 2+  2+  ankle jerk 1+  1+   plantar response down  down   SENSORY:  Reduced vibration at the toes bilaterally.  Pin prick and temperature intact throughout.   Romberg's sign absent.   COORDINATION/GAIT: Normal finger-to- nose-finger.  Intact rapid alternating movements bilaterally.  Gait narrow based and stable. Tandem and stressed gait intact.    Thank you for allowing me to participate in patient's care.  If I can answer any additional questions, I would be pleased to do so.    Sincerely,    Avamae Dehaan K. Posey Pronto, DO

## 2022-09-21 ENCOUNTER — Ambulatory Visit (INDEPENDENT_AMBULATORY_CARE_PROVIDER_SITE_OTHER): Payer: Medicare Other | Admitting: Neurology

## 2022-09-21 ENCOUNTER — Encounter: Payer: Self-pay | Admitting: Neurology

## 2022-09-21 VITALS — BP 158/79 | HR 81 | Ht 64.0 in | Wt 112.0 lb

## 2022-09-21 DIAGNOSIS — R202 Paresthesia of skin: Secondary | ICD-10-CM | POA: Diagnosis not present

## 2022-09-21 MED ORDER — DULOXETINE HCL 30 MG PO CPEP: 30.0000 mg | ORAL_CAPSULE | Freq: Every day | ORAL | 3 refills | Status: DC

## 2022-09-21 NOTE — Patient Instructions (Addendum)
Nerve testing of the right arm and leg  Start Cymbalta 48m daily

## 2022-09-25 DIAGNOSIS — K52839 Microscopic colitis, unspecified: Principal | ICD-10-CM

## 2022-09-25 MED ORDER — COLESTIPOL 1 GRAM TABLET
ORAL_TABLET | 8 refills | 0 days | Status: CP
Start: 2022-09-25 — End: ?

## 2022-09-28 ENCOUNTER — Ambulatory Visit (INDEPENDENT_AMBULATORY_CARE_PROVIDER_SITE_OTHER): Payer: Medicare Other | Admitting: Family Medicine

## 2022-09-28 ENCOUNTER — Encounter: Payer: Self-pay | Admitting: Family Medicine

## 2022-09-28 VITALS — BP 118/64 | Ht 65.0 in | Wt 110.0 lb

## 2022-09-28 DIAGNOSIS — M8000XG Age-related osteoporosis with current pathological fracture, unspecified site, subsequent encounter for fracture with delayed healing: Secondary | ICD-10-CM

## 2022-09-28 MED ORDER — ROMOSOZUMAB-AQQG 105 MG/1.17ML ~~LOC~~ SOSY
210.0000 mg | PREFILLED_SYRINGE | Freq: Once | SUBCUTANEOUS | Status: AC
  Administered 2022-09-28: 210 mg via SUBCUTANEOUS

## 2022-09-28 NOTE — Assessment & Plan Note (Signed)
Acute on chronic in nature.  She is doing well with modalities in place. -Counseled on supportive care. -[place].

## 2022-09-28 NOTE — Progress Notes (Signed)
  Debbie Houston - 81 y.o. female MRN [id number]  Date of birth: Jan 20, 1941  SUBJECTIVE:  Including CC & ROS.  No chief complaint on file.   Debbie Houston is a 81 y.o. female that is following up for her osteoporosis.  She does not report any adverse effects of the medication.   Review of Systems See HPI   HISTORY: Past Medical, Surgical, Social, and Family History Reviewed & Updated per EMR.   Pertinent Historical Findings include:  Past Medical History:  Diagnosis Date   Arthritis    Benign labile hypertension    Cancer ([place])    skin cancer on face   Colitis    DJD (degenerative joint disease)    Endometrial polyp    GERD (gastroesophageal reflux disease)    History of basal cell carcinoma excision    History of esophagitis    HOH (hard of hearing)    right ear better than left  -- wears no aides   Hyperlipidemia    Hypothyroidism    Neuromuscular disorder ([place])    neuropathy in feet   Osteopenia    Prediabetes    S/P right hip fracture 08/17/2020   Vitamin D deficiency    Wears glasses     Past Surgical History:  Procedure Laterality Date   BACK SURGERY  2020   CATARACT EXTRACTION, BILATERAL Bilateral 2020   CONVERSION TO TOTAL HIP Right 01/22/2022   Procedure: CONVERSION TO TOTAL HIP;  Surgeon: Willaim Sheng, MD;  Location: [facility];  Service: Orthopedics;  Laterality: Right;   DILATATION & CURETTAGE/HYSTEROSCOPY WITH MYOSURE N/A 10/27/2014   Procedure: DILATATION & CURETTAGE/HYSTEROSCOPY WITH MYOSURE;  Surgeon: Darlyn Chamber, MD;  Location: [facility];  Service: Gynecology;  Laterality: N/A;   DILATION AND CURETTAGE OF UTERUS     HIP SURGERY Right 04/2020   rod and screw in right hip    MOHS SURGERY  2005   LEFT NASAL BRIDGE FOR BASAL CELL   TONSILLECTOMY  age 76     PHYSICAL EXAM:  VS: BP 118/64   Ht 5' 5"  (1.651 m)   Wt 110 lb (49.9 kg)   BMI 18.30 kg/m  Physical Exam Gen: NAD, alert, cooperative with exam, well-appearing MSK:   Neurovascularly intact       ASSESSMENT & PLAN:   Osteoporosis with current pathological fracture Acute on chronic in nature.  She is doing well with modalities in place. -Counseled on supportive care. -[place].

## 2022-10-02 DIAGNOSIS — H26493 Other secondary cataract, bilateral: Secondary | ICD-10-CM | POA: Diagnosis not present

## 2022-10-02 DIAGNOSIS — H18413 Arcus senilis, bilateral: Secondary | ICD-10-CM | POA: Diagnosis not present

## 2022-10-02 DIAGNOSIS — H26491 Other secondary cataract, right eye: Secondary | ICD-10-CM | POA: Diagnosis not present

## 2022-10-02 DIAGNOSIS — H04123 Dry eye syndrome of bilateral lacrimal glands: Secondary | ICD-10-CM | POA: Diagnosis not present

## 2022-10-02 DIAGNOSIS — Z961 Presence of intraocular lens: Secondary | ICD-10-CM | POA: Diagnosis not present

## 2022-10-03 DIAGNOSIS — M25562 Pain in left knee: Secondary | ICD-10-CM | POA: Diagnosis not present

## 2022-10-03 DIAGNOSIS — M25511 Pain in right shoulder: Secondary | ICD-10-CM | POA: Diagnosis not present

## 2022-10-03 DIAGNOSIS — M25561 Pain in right knee: Secondary | ICD-10-CM | POA: Diagnosis not present

## 2022-10-03 DIAGNOSIS — M542 Cervicalgia: Secondary | ICD-10-CM | POA: Diagnosis not present

## 2022-10-09 ENCOUNTER — Ambulatory Visit (INDEPENDENT_AMBULATORY_CARE_PROVIDER_SITE_OTHER): Payer: Medicare Other | Admitting: Neurology

## 2022-10-09 DIAGNOSIS — R202 Paresthesia of skin: Secondary | ICD-10-CM

## 2022-10-09 DIAGNOSIS — G629 Polyneuropathy, unspecified: Secondary | ICD-10-CM

## 2022-10-09 NOTE — Procedures (Signed)
[facility]  [address]  [place], [place] [address] Tel: [phone] Fax:  [phone] Test Date:  10/09/2022  Patient: Debbie Houston DOB: [birth year] Physician: Narda Amber, DO  Sex: Female Height: 5' 5"  Ref Phys: Narda Amber, DO  ID#: [id number]   Technician:    Patient Complaints: This is an 81 year old female referred for evaluation of paresthesias involving the hands and feet.  NCV & EMG Findings: Extensive electrodiagnostic testing of the right upper and lower extremity shows:  Right median and superficial peroneal sensory responses are absent.  Right ulnar sensory response shows prolonged latency (4.6 ms).  Right radial and sural sensory responses are within normal limits. Right median motor response shows prolonged latency (5.6 ms) and reduced amplitude (4.5 mV).  Right ulnar motor response shows reduced amplitude (6.0 mV) and decreased conduction velocity (A Elbow-B Elbow, 48 m/s).  Right peroneal motor response at the extensor digitorum brevis is reduced, and normal at the tibialis anterior.  Right tibial motor response shows reduced amplitude (2.2 mV). In the right upper extremity, chronic motor axon loss changes are seen affecting the first dorsal interosseous and abductor pollicis brevis muscles, without accompanying active denervation. In the right lower extremity, chronic motor axonal loss changes are seen affecting the L5 myotome.   Impression: This is a complex study.  Findings are most suggestive of a non-length dependent and chronic sensorimotor polyneuropathy, with axonal and demyelinating features. Alternatively, a polyneuropathy with superimposed median and ulnar entrapment neuropathies in the upper extremity is possible.  Nerve ultrasound recommended to further evaluate. Chronic L5 radiculopathy affecting the right lower extremity, mild.   ___________________________ Narda Amber, DO    Nerve Conduction Studies Anti Sensory Summary  Table   Stim Site NR Peak (ms) Norm Peak (ms) O-P Amp (V) Norm O-P Amp  Right Median Anti Sensory (2nd Digit)  32C  Wrist NR  <3.8  >10  Right Radial Anti Sensory (Base 1st Digit)  32C  Wrist    2.5 <2.8 10.9 >10  Right Sup Peroneal Anti Sensory (Ant Lat Mall)  32C  12 cm NR  <4.6  >3  Right Sural Anti Sensory (Lat Mall)  32C  Calf    2.7 <4.6 5.0 >3  Right Ulnar Anti Sensory (5th Digit)  32C  Wrist    4.6 <3.2 7.8 >5   Motor Summary Table   Stim Site NR Onset (ms) Norm Onset (ms) O-P Amp (mV) Norm O-P Amp Site1 Site2 Delta-0 (ms) Dist (cm) Vel (m/s) Norm Vel (m/s)  Right Median Motor (Abd Poll Brev)  32C  Wrist    5.6 <4.0 4.5 >5 Elbow Wrist 5.4 27.0 50 >50  Elbow    11.0  4.2         Right Peroneal Motor (Ext Dig Brev)  32C  Ankle    4.1 <6.0 0.9 >2.5 B Fib Ankle 8.4 34.0 40 >40  B Fib    12.5  0.8  Poplt B Fib 1.6 7.0 44 >40  Poplt    14.1  0.7         Right Peroneal TA Motor (Tib Ant)  32C  Fib Head    3.5 <4.5 5.7 >3 Poplit Fib Head 1.3 7.0 54 >40  Poplit    4.8  5.4         Right Tibial Motor (Abd Hall Brev)  32C  Ankle    4.4 <6.0 2.2 >4 Knee Ankle 10.2 41.0 40 >40  Knee  14.6  1.0         Right Ulnar Motor (Abd Dig Minimi)  32C  Wrist    2.7 <3.1 6.0 >7 B Elbow Wrist 3.5 21.0 60 >50  B Elbow    6.2  5.3  A Elbow B Elbow 2.1 10.0 48 >50  A Elbow    8.3  4.9          H Reflex Studies   NR H-Lat (ms) Lat Norm (ms) L-R H-Lat (ms)  Right Tibial (Gastroc)  32C     32.65 <35    EMG   Side Muscle Ins Act Fibs Fasc Recrt Dur. Amp. Poly. Activation Comment  Right 1stDorInt Nml Nml Nml 1- 1+ 1+ 1+ Nml N/A  Right AntTibialis Nml Nml Nml 1- 1+ 1+ 1+ Nml N/A  Right Gastroc Nml Nml Nml Nml Nml Nml Nml Nml N/A  Right Flex Dig Long Nml Nml Nml 1- 1+ 1+ 1+ Nml N/A  Right RectFemoris Nml Nml Nml Nml Nml Nml Nml Nml N/A  Right GluteusMed Nml Nml Nml 1- 1+ 1+ 1+ Nml N/A  Right Abd Poll Brev Nml Nml Nml 1- 1+ 1+ 1+ Nml N/A  Right PronatorTeres Nml Nml Nml Nml Nml  Nml Nml Nml N/A  Right Biceps Nml Nml Nml Nml Nml Nml Nml Nml N/A  Right Triceps Nml Nml Nml Nml Nml Nml Nml Nml N/A  Right Deltoid Nml Nml Nml Nml Nml Nml Nml Nml N/A  Right FlexCarpiUln Nml Nml Nml Nml Nml Nml Nml Nml N/A      Waveforms:

## 2022-10-16 DIAGNOSIS — M170 Bilateral primary osteoarthritis of knee: Secondary | ICD-10-CM | POA: Diagnosis not present

## 2022-10-18 DIAGNOSIS — M25561 Pain in right knee: Secondary | ICD-10-CM | POA: Diagnosis not present

## 2022-10-18 DIAGNOSIS — M542 Cervicalgia: Secondary | ICD-10-CM | POA: Diagnosis not present

## 2022-10-18 DIAGNOSIS — M25562 Pain in left knee: Secondary | ICD-10-CM | POA: Diagnosis not present

## 2022-10-18 DIAGNOSIS — M25511 Pain in right shoulder: Secondary | ICD-10-CM | POA: Diagnosis not present

## 2022-10-19 ENCOUNTER — Telehealth: Payer: Self-pay | Admitting: Anesthesiology

## 2022-10-19 NOTE — Telephone Encounter (Signed)
Pt is sch for 10-31-22

## 2022-10-19 NOTE — Telephone Encounter (Signed)
Patient called to request her EMG results.

## 2022-10-19 NOTE — Telephone Encounter (Signed)
See lab results. Patient has been notified.

## 2022-10-22 ENCOUNTER — Other Ambulatory Visit (INDEPENDENT_AMBULATORY_CARE_PROVIDER_SITE_OTHER): Payer: Medicare Other

## 2022-10-22 ENCOUNTER — Other Ambulatory Visit: Payer: Self-pay

## 2022-10-22 DIAGNOSIS — G629 Polyneuropathy, unspecified: Secondary | ICD-10-CM

## 2022-10-22 DIAGNOSIS — R202 Paresthesia of skin: Secondary | ICD-10-CM | POA: Diagnosis not present

## 2022-10-22 LAB — FOLATE: Folate: >23.8 ng/mL (ref >5.9)

## 2022-10-22 LAB — SEDIMENTATION RATE: Sed Rate: 19 mm/hr (ref 0–30)

## 2022-10-22 LAB — C-REACTIVE PROTEIN: CRP: <1 mg/dL (ref 0.5–20.0)

## 2022-10-23 ENCOUNTER — Telehealth: Admit: 2022-10-23 | Payer: MEDICARE | Attending: Internal Medicine | Primary: Internal Medicine

## 2022-10-23 DIAGNOSIS — K52839 Microscopic colitis, unspecified: Secondary | ICD-10-CM | POA: Diagnosis not present

## 2022-10-23 NOTE — Progress Notes (Deleted)
Assessment and Plan:  There are no diagnoses linked to this encounter.    Further disposition pending results of labs. Discussed med's effects and SE's.   Over 30 minutes of exam, counseling, chart review, and critical decision making was performed.   Future Appointments  Date Time Provider [place]  10/24/2022 11:00 AM Alycia Rossetti, NP [facility] None  10/29/2022 11:15 AM [facility] NURSE [facility]  10/31/2022 10:30 AM Narda Amber K, DO [facility]BN[facility] None  12/25/2022 10:30 AM Unk Pinto, MD [facility] None  01/21/2023  3:30 PM Narda Amber K, DO LBN[facility] None  03/14/2023  1:40 PM Bo Merino, MD CR-GSO None  04/29/2023 10:30 AM Rex Kras, DO PCV-[facility] None  06/25/2023  2:00 PM Unk Pinto, MD [facility] None    ------------------------------------------------------------------------------------------------------------------   HPI There were no vitals taken for this visit. 81 y.o.female presents for  Past Medical History:  Diagnosis Date   Arthritis    Benign labile hypertension    Cancer ([place])    skin cancer on face   Colitis    DJD (degenerative joint disease)    Endometrial polyp    GERD (gastroesophageal reflux disease)    History of basal cell carcinoma excision    History of esophagitis    HOH (hard of hearing)    right ear better than left  -- wears no aides   Hyperlipidemia    Hypothyroidism    Neuromuscular disorder ([place])    neuropathy in feet   Osteopenia    Prediabetes    S/P right hip fracture 08/17/2020   Vitamin D deficiency    Wears glasses      Allergies  Allergen Reactions   Remicade [Infliximab]    Fosamax [Alendronate Sodium] Other (See Comments)    GI upset    Singulair [Montelukast Sodium] Other (See Comments)    Makes pt jittery   Wellbutrin [Bupropion] Hives   Clindamycin/Lincomycin Rash   Sulfa Antibiotics Rash    Current Outpatient Medications on File Prior to Visit  Medication Sig   acetaminophen (TYLENOL)  500 MG tablet Take 500 mg by mouth at bedtime as needed for moderate pain or headache.    Ascorbic Acid (VITAMIN C) 1000 MG tablet Take 1,000 mg by mouth daily.    aspirin EC 81 MG tablet Take 1 tablet (81 mg total) by mouth in the morning and at bedtime. (Patient taking differently: Take 81 mg by mouth daily.)   bisoprolol (ZEBETA) 5 MG tablet TAKE 1/2 TABLET BY MOUTH EVERY DAY IN THE EVENING FOR BLOOD PRESSURE   Calcium Carbonate-Vit D-Min (CALCIUM 1200 PO) Take 1,200 mg by mouth daily.   CINNAMON PO Take 2,000 mg by mouth daily.   Coenzyme Q10 (COQ10) 200 MG CAPS Take 200 mg by mouth daily.   colestipol (COLESTID) 1 g tablet Take 4 g by mouth daily.   cycloSPORINE (RESTASIS) 0.05 % ophthalmic emulsion Place 1 drop into both eyes 2 (two) times daily.   DULoxetine (CYMBALTA) 30 MG capsule Take 1 capsule (30 mg total) by mouth daily.   levothyroxine (SYNTHROID) 125 MCG tablet TAKE 1 TABLET BY MOUTH DAILY ON AN EMPTY STOMACH WITH ONLY WATER FOR 30 MINUTES(NO ANTACIDS MEDICATIONS, CALCIUM OR MAGNESIUM FOR 4 HOURS) (Patient taking differently: Take 125 mcg by mouth daily before breakfast.)   loperamide (IMODIUM) 2 MG capsule Take 4 mg by mouth 2 (two) times daily.   loratadine (CLARITIN) 10 MG tablet Take 10 mg by mouth daily as needed for allergies.   Mesalamine (ASACOL  PO) Take by mouth. Take 2 tablets once daily   Multiple Vitamin (THERA VITAMIN PO) Take 1 tablet by mouth daily.   Omega-3 Fatty Acids (FISH OIL) 1200 MG CAPS Take 1,200 mg by mouth daily.    pantoprazole (PROTONIX) 40 MG tablet Take 1 tablet  2 x /day  for Heartburn & Indigestion (Patient taking differently: Take 40 mg by mouth 2 (two) times daily.)   Probiotic Product (PROBIOTIC PO) Take 1 capsule by mouth daily.   PROLIA 60 MG/ML SOSY injection Inject 60 mg into the skin every 6 (six) months.   pseudoephedrine (SUDAFED) 120 MG 12 hr tablet Take  1 tablet  2 x /day (every 12 hours)  for Head and Chest Congestion (Patient taking  differently: Take 120 mg by mouth daily as needed for congestion (Head and chest congestion).)   simvastatin (ZOCOR) 20 MG tablet Take  1 tablet  at Bedtime  for Cholesterol                                                 /                        TAKE             BY MOUTH (Patient taking differently: Take 20 mg by mouth daily at 6 PM.)   Current Facility-Administered Medications on File Prior to Visit  Medication   inFLIXimab (REMICADE) 4.02 mg/kg = 200 mg in sodium chloride 0.9 % 250 mL infusion    ROS: all negative except above.   Physical Exam:  There were no vitals taken for this visit.  General Appearance: Well nourished, in no apparent distress. Eyes: PERRLA, EOMs, conjunctiva no swelling or erythema Sinuses: No Frontal/maxillary tenderness ENT/Mouth: Ext aud canals clear, TMs without erythema, bulging. No erythema, swelling, or exudate on post pharynx.  Tonsils not swollen or erythematous. Hearing normal.  Neck: Supple, thyroid normal.  Respiratory: Respiratory effort normal, BS equal bilaterally without rales, rhonchi, wheezing or stridor.  Cardio: RRR with no MRGs. Brisk peripheral pulses without edema.  Abdomen: Soft, + BS.  Non tender, no guarding, rebound, hernias, masses. Lymphatics: Non tender without lymphadenopathy.  Musculoskeletal: Full ROM, 5/5 strength, normal gait.  Skin: Warm, dry without rashes, lesions, ecchymosis.  Neuro: Cranial nerves intact. Normal muscle tone, no cerebellar symptoms. Sensation intact.  Psych: Awake and oriented X 3, normal affect, Insight and Judgment appropriate.     Alycia Rossetti, NP 1:06 PM [facility]

## 2022-10-24 ENCOUNTER — Ambulatory Visit: Payer: Medicare Other | Admitting: Nurse Practitioner

## 2022-10-24 ENCOUNTER — Encounter: Payer: Self-pay | Admitting: Internal Medicine

## 2022-10-24 DIAGNOSIS — M25562 Pain in left knee: Secondary | ICD-10-CM | POA: Diagnosis not present

## 2022-10-24 DIAGNOSIS — M25511 Pain in right shoulder: Secondary | ICD-10-CM | POA: Diagnosis not present

## 2022-10-24 DIAGNOSIS — M25561 Pain in right knee: Secondary | ICD-10-CM | POA: Diagnosis not present

## 2022-10-24 DIAGNOSIS — M542 Cervicalgia: Secondary | ICD-10-CM | POA: Diagnosis not present

## 2022-10-25 LAB — PROTEIN ELECTROPHORESIS, SERUM
Albumin ELP: 3.8 g/dL (ref 3.8–4.8)
Alpha 1: 0.3 g/dL (ref 0.2–0.3)
Alpha 2: 0.6 g/dL (ref 0.5–0.9)
Beta 2: 0.2 g/dL (ref 0.2–0.5)
Beta Globulin: 0.4 g/dL (ref 0.4–0.6)
Gamma Globulin: 0.6 g/dL — ABNORMAL LOW (ref 0.8–1.7)
Total Protein: 5.9 g/dL — ABNORMAL LOW (ref 6.1–8.1)

## 2022-10-25 LAB — IMMUNOFIXATION ELECTROPHORESIS
IgG (Immunoglobin G), Serum: 741 mg/dL (ref 600–1540)
IgM, Serum: 46 mg/dL — ABNORMAL LOW (ref 50–300)
Immunoglobulin A: 48 mg/dL — ABNORMAL LOW (ref 70–320)

## 2022-10-25 LAB — COPPER, SERUM: Copper: 108 ug/dL (ref 70–175)

## 2022-10-29 ENCOUNTER — Ambulatory Visit (INDEPENDENT_AMBULATORY_CARE_PROVIDER_SITE_OTHER): Payer: Medicare Other

## 2022-10-29 VITALS — Ht 65.0 in | Wt 110.0 lb

## 2022-10-29 DIAGNOSIS — M8000XG Age-related osteoporosis with current pathological fracture, unspecified site, subsequent encounter for fracture with delayed healing: Secondary | ICD-10-CM

## 2022-10-29 MED ORDER — ROMOSOZUMAB-AQQG 105 MG/1.17ML ~~LOC~~ SOSY
210.0000 mg | PREFILLED_SYRINGE | Freq: Once | SUBCUTANEOUS | Status: AC
  Administered 2022-10-29: 210 mg via SUBCUTANEOUS

## 2022-10-29 NOTE — Progress Notes (Signed)
Future Appointments  Date Time Provider Department  10/30/2022  2:30 PM Unk Pinto, MD [facility]  10/31/2022 10:30 AM Narda Amber K, DO [facility]  12/25/2022 10:30 AM Unk Pinto, MD [facility]  01/21/2023  3:30 PM Narda Amber K, DO [facility]  03/14/2023  1:40 PM Bo Merino, MD [facility]  04/29/2023 10:30 AM Rex Kras, DO [facility]  06/25/2023  2:00 PM Unk Pinto, MD [facility]    History of Present Illness:    The patient is a very  nice 81 y.o. MWF with  HTN, HLD, Pre-Diabetes, Osteoporosis,  Hypothyroidism and Vitamin D Deficiency  who presents with concern of possible UTI . She had (+) Ecoli on Aug 2 & was treated with Cipro. Then she subsequent had  negative urine cultures on Aug 9 & Aug 15. In Mid Sept  ( 9/18) U/C was (+) for Ecoli & was trearted with  Macrobid x 5 days.  Repeat U/C  on 10/31 had no growth.  She presents now with c/o dysuria & reports cloudy. Also reports occasional fever to 100-101 deg.         Medications    levothyroxine 125 MCG tablet, TAKE 1 TABLET  DAILY    PROLIA 60 MG/ML SOSY injection, Inject 60 mg into the skin every 6 (six) months.   bisoprolol ( 5 MG tablet, TAKE 1/2 TABLET  EVERY DAY    colestipol ( 1 g tablet, Take 4 g  daily.   simvastatin  20 MG tablet, Take  1 tablet  at Bedtime    loratadine  10 MG tablet, Take 1 daily as needed for allergies.   pseudoephedrine  120 MG 12 hr tablet, Take  1 tablet  2 x /day   acetaminophen  500 MG tablet, Take 500 mg by mouth at   aspirin EC 81 MG , Take 81 mg by mouth daily.)   VITAMIN C  1000 MG tablet, Take  daily.    Calcium Carbonate-Vit D-Min (CALCIUM 1200 PO), Take  daily.   CINNAMON PO, Take 2,000 mg by mouth daily.   Coenzyme Q10 (COQ10) 200 MG CAPS, Take 200 mg by mouth daily.   cycloSPORINE (RESTASIS) 0.05 % ophthalmic emulsion, Place 1 drop into both eyes 2  times daily.   DULoxetine (CYMBALTA) 30 MG capsule, Take 1 capsule daily.   loperamide (IMODIUM) 2 MG capsule, Take  4 mg  2  times daily.   Mesalamine (ASACOL PO), Take 2 tablets once daily   Multiple Vitamin , Take 1 tablet  daily.   Omega-3  FISH OIL 1200 MG CAPS, Take  daily.    pantoprazole 40 MG tablet, Take 1 tablet  2 x /day      inFLIXimab (REMICADE) 4.02 mg/kg = 200 mg in sodium chloride 0.9 % 250 mL infusion  Problem list She has Essential hypertension; Hyperlipidemia, mixed; Hypothyroid; GERD; Asthma; DJD (degenerative joint disease); Vitamin D deficiency; Osteoporosis with current pathological fracture; Allergy; Medication management; Inflammatory bowel disease; Spondylolisthesis at L4-L5 level; Vertigo; Bilateral temporomandibular joint pain; Senile purpura ([place]); Bilateral nephrolithiasis; Body mass index (BMI) of 19.0-19.9 in adult; Complete right bundle branch block (RBBB); History of ulcerative colitis; Intertrochanteric fracture of right femur, closed, with nonunion, subsequent encounter; Extensor tenosynovitis of right wrist; Acute metabolic encephalopathy; Hypokalemia; Thrombocytopenia ([place]); Prediabetes; and Sepsis secondary to UTI ([place]) on their problem list.   Observations/Objective:  BP 138/82   Pulse 78   Temp 97.9 F (36.6 C)   Resp 17   Ht 5' 5"  (1.651  m)   Wt 111 lb 12.8 oz (50.7 kg)   SpO2 99%   BMI 18.60 kg/m   HEENT - WNL. Neck - supple.  Chest - Clear equal BS. No CVAT. Cor - Nl HS. RRR w/o sig MGR. PP 1(+). No edema. Abd - Sl suprapubic tenderness.  MS- FROM w/o deformities.  Gait Nl. Neuro -  Nl w/o focal abnormalities.  Assessment and Plan:  1. Dysuria  - Urinalysis, Routine w reflex microscopic - Urine Culture  Follow Up Instructions:       I discussed the assessment and treatment plan with the patient. The patient was provided an opportunity to ask questions and all were answered. The patient agreed with the plan and demonstrated an understanding of the instructions.       The patient was advised to call back or seek an in-person evaluation if the  symptoms worsen or if the condition fails to improve as anticipated.    Kirtland Bouchard, MD

## 2022-10-29 NOTE — Progress Notes (Signed)
Patient is here for nurse visit for her 8th Evenity injection. Patient received bilateral  arm injections. She tolerated injections well. She will return in 1 month for her next Evenity injection.

## 2022-10-30 ENCOUNTER — Ambulatory Visit (INDEPENDENT_AMBULATORY_CARE_PROVIDER_SITE_OTHER): Payer: Medicare Other | Admitting: Internal Medicine

## 2022-10-30 ENCOUNTER — Encounter: Payer: Self-pay | Admitting: Internal Medicine

## 2022-10-30 VITALS — BP 138/82 | HR 78 | Temp 97.9°F | Resp 17 | Ht 65.0 in | Wt 111.8 lb

## 2022-10-30 DIAGNOSIS — R300 Dysuria: Secondary | ICD-10-CM

## 2022-10-31 ENCOUNTER — Encounter: Payer: Self-pay | Admitting: Internal Medicine

## 2022-10-31 ENCOUNTER — Ambulatory Visit (INDEPENDENT_AMBULATORY_CARE_PROVIDER_SITE_OTHER): Payer: Medicare Other | Admitting: Neurology

## 2022-10-31 ENCOUNTER — Encounter: Payer: Self-pay | Admitting: Neurology

## 2022-10-31 VITALS — BP 133/81 | HR 74 | Ht 65.0 in | Wt 109.0 lb

## 2022-10-31 DIAGNOSIS — G610 Guillain-Barre syndrome: Secondary | ICD-10-CM | POA: Diagnosis not present

## 2022-10-31 LAB — URINE CULTURE
MICRO NUMBER:: 14304967
SPECIMEN QUALITY:: ADEQUATE

## 2022-10-31 LAB — URINALYSIS, ROUTINE W REFLEX MICROSCOPIC
Bilirubin Urine: NEGATIVE
Glucose, UA: NEGATIVE
Hgb urine dipstick: NEGATIVE
Hyaline Cast: NONE SEEN /LPF
Ketones, ur: NEGATIVE
Nitrite: NEGATIVE
Protein, ur: NEGATIVE
Specific Gravity, Urine: 1.007 (ref 1.001–1.035)
Squamous Epithelial / HPF: NONE SEEN /HPF (ref <=5)
pH: 6.5 (ref 5.0–8.0)

## 2022-10-31 LAB — MICROSCOPIC MESSAGE

## 2022-10-31 MED ORDER — DULOXETINE HCL 30 MG PO CPEP: 30.0000 mg | ORAL_CAPSULE | Freq: Two times a day (BID) | ORAL | 3 refills | Status: DC

## 2022-10-31 NOTE — Patient Instructions (Addendum)
Increase Cymbalta to 2m twice daily  We will order lumbar puncture to further investigate your symptoms.  Start to use a cane/hiking stick to help with your balance  I will see you back after your results are back.

## 2022-10-31 NOTE — Progress Notes (Signed)
Follow-up Visit   Date: 10/31/2022    Debbie Houston MRN: [id number] DOB: February 07, 1941    Debbie Houston is a 81 y.o. left-handed Caucasian female with ulcerative colitis, hyperlipidemia, GERD, asthma, hypothyroidism, OA, cervical spondylosis, lumbar spondylolisthesis at L4-5 (followed by Dr. Ellene Route)  returning to the clinic for follow-up of bilateral hand and feet pain/paresthesias.  The patient was accompanied to the clinic by self.  IMPRESSION/PLAN: Polyradiculoneuropathy affecting the hands and feet.  Results of NCS/EMG reviewed and shows non-length dependent neuropathy with demyelinating and axonal changes in a patchy distribution. Given the subacute onset of symptoms, non-length dependent process, history of ulcerative colitis, and EMG findings, need to evaluate for immune-mediated pathology, such as CIDP with CSF testing.   - Check CSF cell count and diff, protein, glucose, IgG index, oligoclonal bands, myelin basic protein, flow cytology, ACE  - Increase Cymbalta to 39m twice daily  - Fall precautions discussed, encouraged to use a cane  Follow-up after testing  --------------------------------------------- History of present illness: She received Remicade infusion for Crohn's colitis in July and following this developed UTI.  She was hospitalized in August for urosepsis.  While hospitalized, she began having numbness, tingling, and sharp shooting pain over the hands and feet.  Numbness is constant and she has episodic sharp pain.  She tried gabapentin 2047mbut stopped this due to dizziness.   She complains of weakness with difficulty with opening jars/grip.  She has some imbalance, walks unassisted and does not use a cane. Over the past three months, symptoms remain unchanged.  She denies having any numbness/tingling prior to her hospitalization.    She lives at home with husband and high-functioning autistic son.   UPDATE 10/31/2022:  She is here for follow-up and discuss  results of EMG and labs.  There has been no change in the numbness of her hands. She continues to have difficulty with holding onto objects and frequently drops them.  She also endorse imbalance and had one fall which results in bruise over her right eye.  Tingling has improved slightly on Cymbalta 3046mnd she would like to consider increasing the dose. No new complaints.    Medications:  Current Outpatient Medications on File Prior to Visit  Medication Sig Dispense Refill   acetaminophen (TYLENOL) 500 MG tablet Take 500 mg by mouth at bedtime as needed for moderate pain or headache.      Ascorbic Acid (VITAMIN C) 1000 MG tablet Take 1,000 mg by mouth daily.      aspirin EC 81 MG tablet Take 1 tablet (81 mg total) by mouth in the morning and at bedtime. (Patient taking differently: Take 81 mg by mouth daily.) 60 tablet 0   bisoprolol (ZEBETA) 5 MG tablet TAKE 1/2 TABLET BY MOUTH EVERY DAY IN THE EVENING FOR BLOOD PRESSURE 45 tablet 3   Calcium Carbonate-Vit D-Min (CALCIUM 1200 PO) Take 1,200 mg by mouth daily.     CINNAMON PO Take 2,000 mg by mouth daily.     Coenzyme Q10 (COQ10) 200 MG CAPS Take 200 mg by mouth daily.     colestipol (COLESTID) 1 g tablet Take 4 g by mouth daily.     cycloSPORINE (RESTASIS) 0.05 % ophthalmic emulsion Place 1 drop into both eyes 2 (two) times daily.     DULoxetine (CYMBALTA) 30 MG capsule Take 1 capsule (30 mg total) by mouth daily. 30 capsule 3   levothyroxine (SYNTHROID) 125 MCG tablet TAKE 1 TABLET BY MOUTH DAILY ON AN  EMPTY STOMACH WITH ONLY WATER FOR 30 MINUTES(NO ANTACIDS MEDICATIONS, CALCIUM OR MAGNESIUM FOR 4 HOURS) (Patient taking differently: Take 125 mcg by mouth daily before breakfast.) 90 tablet 0   loperamide (IMODIUM) 2 MG capsule Take 4 mg by mouth 2 (two) times daily.     loratadine (CLARITIN) 10 MG tablet Take 10 mg by mouth daily as needed for allergies.     Mesalamine (ASACOL PO) Take by mouth. Take 2 tablets once daily     Multiple Vitamin  (THERA VITAMIN PO) Take 1 tablet by mouth daily.     Omega-3 Fatty Acids (FISH OIL) 1200 MG CAPS Take 1,200 mg by mouth daily.      pantoprazole (PROTONIX) 40 MG tablet Take 1 tablet  2 x /day  for Heartburn & Indigestion (Patient taking differently: Take 40 mg by mouth 2 (two) times daily.) 180 tablet 0   Probiotic Product (PROBIOTIC PO) Take 1 capsule by mouth daily.     PROLIA 60 MG/ML SOSY injection Inject 60 mg into the skin every 6 (six) months.     pseudoephedrine (SUDAFED) 120 MG 12 hr tablet Take  1 tablet  2 x /day (every 12 hours)  for Head and Chest Congestion (Patient taking differently: Take 120 mg by mouth daily as needed for congestion (Head and chest congestion).) 60 tablet 0   simvastatin (ZOCOR) 20 MG tablet Take  1 tablet  at Bedtime  for Cholesterol                                                 /                        TAKE             BY MOUTH (Patient taking differently: Take 20 mg by mouth daily at 6 PM.) 90 tablet 3   Current Facility-Administered Medications on File Prior to Visit  Medication Dose Route Frequency Provider Last Rate Last Admin   inFLIXimab (REMICADE) 4.02 mg/kg = 200 mg in sodium chloride 0.9 % 250 mL infusion  4.02 mg/kg Intravenous Once Unk Pinto, MD        Allergies:  Allergies  Allergen Reactions   Remicade [Infliximab]    Fosamax [Alendronate Sodium] Other (See Comments)    GI upset    Singulair [Montelukast Sodium] Other (See Comments)    Makes pt jittery   Wellbutrin [Bupropion] Hives   Clindamycin/Lincomycin Rash   Sulfa Antibiotics Rash    Vital Signs:  BP 133/81   Pulse 74   Ht _0  (1.651 m)   Wt 109 lb (49.4 kg)   SpO2 99%   BMI 18.14 kg/m   Neurological Exam: MENTAL STATUS including orientation to time, place, person, recent and remote memory, attention span and concentration, language, and fund of knowledge is normal.  Speech is not dysarthric.  CRANIAL NERVES:   Pupils equal round and reactive to light.  Normal  conjugate, extra-ocular eye movements in all directions of gaze.  No ptosis .  Face is symmetric.   MOTOR:  Motor strength is 5/5 in all extremities, except 4/5 intrinsic hand muscles.  Generalized loss of muscle bulk with bilateral ABP atrophy, fasciculations or abnormal movements.  No pronator drift.  Tone is normal.    MSRs:  Reflexes are 2+/4 throughout,  except absent at the ankles.  SENSORY:  Intact to vibration throughout, except trace at the ankles bilaterally.  COORDINATION/GAIT:  Normal finger-to- nose-finger.  Intact rapid alternating movements bilaterally.  Gait wide based and slightly unstable, unassisted  Data: Labs 10/22/2022:  CRP < 1.0, ESR 19, folate > 23, copper 108, SPEP with IFE poorly defined IgG and kappa  NCS/EMG of the arms 10/09/2022: This is a complex study.  Findings are most suggestive of a non-length dependent and chronic sensorimotor polyneuropathy, with axonal and demyelinating features. Alternatively, a polyneuropathy with superimposed median and ulnar entrapment neuropathies in the upper extremity is possible.  Nerve ultrasound recommended to further evaluate. Chronic L5 radiculopathy affecting the right lower extremity, mild.   Thank you for allowing me to participate in patient's care.  If I can answer any additional questions, I would be pleased to do so.    Sincerely,    Via Rosado K. Posey Pronto, DO

## 2022-11-01 ENCOUNTER — Other Ambulatory Visit: Payer: Self-pay

## 2022-11-01 DIAGNOSIS — M25511 Pain in right shoulder: Secondary | ICD-10-CM | POA: Diagnosis not present

## 2022-11-01 DIAGNOSIS — M25561 Pain in right knee: Secondary | ICD-10-CM | POA: Diagnosis not present

## 2022-11-01 DIAGNOSIS — M542 Cervicalgia: Secondary | ICD-10-CM | POA: Diagnosis not present

## 2022-11-01 DIAGNOSIS — G610 Guillain-Barre syndrome: Secondary | ICD-10-CM

## 2022-11-01 DIAGNOSIS — G629 Polyneuropathy, unspecified: Secondary | ICD-10-CM

## 2022-11-01 DIAGNOSIS — M25562 Pain in left knee: Secondary | ICD-10-CM | POA: Diagnosis not present

## 2022-11-01 NOTE — Progress Notes (Signed)
<><><><><><><><><><><><><><><><><><><><><><><><><><><><><><><><><> <><><><><><><><><><><><><><><><><><><><><><><><><><><><><><><><><>  -   Urine culture is Negative,     ie, No Infection                     & No Antibiotic needed  <><><><><><><><><><><><><><><><><><><><><><><><><><><><><><><><><> <><><><><><><><><><><><><><><><><><><><><><><><><><><><><><><><><>

## 2022-11-14 DIAGNOSIS — R197 Diarrhea, unspecified: Principal | ICD-10-CM

## 2022-11-14 MED ORDER — LOPERAMIDE 2 MG TABLET
ORAL_TABLET | Freq: Four times a day (QID) | ORAL | 3 refills | 30 days | Status: CP | PRN
Start: 2022-11-14 — End: ?

## 2022-11-16 MED ORDER — MESALAMINE 1.2 GRAM TABLET,DELAYED RELEASE
ORAL_TABLET | Freq: Every day | ORAL | 8 refills | 30 days | Status: CP
Start: 2022-11-16 — End: ?

## 2022-11-20 DIAGNOSIS — M25561 Pain in right knee: Secondary | ICD-10-CM | POA: Diagnosis not present

## 2022-11-20 DIAGNOSIS — M25562 Pain in left knee: Secondary | ICD-10-CM | POA: Diagnosis not present

## 2022-11-20 DIAGNOSIS — M542 Cervicalgia: Secondary | ICD-10-CM | POA: Diagnosis not present

## 2022-11-20 DIAGNOSIS — M25511 Pain in right shoulder: Secondary | ICD-10-CM | POA: Diagnosis not present

## 2022-11-22 ENCOUNTER — Telehealth: Payer: Self-pay | Admitting: Anesthesiology

## 2022-11-22 NOTE — Telephone Encounter (Signed)
Debbie Houston from [facility] left message with AN stating she needs clarification about an order. Call back number is [phone].

## 2022-11-22 NOTE — Telephone Encounter (Signed)
[facility] is wanting to know if patient needs Flow Cytometry or Cytology? They are wanting to clarify before patients appt tomorrow.

## 2022-11-22 NOTE — Telephone Encounter (Signed)
Spoke to Dr. Posey Pronto and she stated Cytology.   Called Kim at [facility] and informed her the order is Cytology. She will update order.

## 2022-11-22 NOTE — Telephone Encounter (Signed)
Flow cytology.  Thank you.

## 2022-11-22 NOTE — Discharge Instructions (Signed)

## 2022-11-23 ENCOUNTER — Other Ambulatory Visit (HOSPITAL_COMMUNITY)
Admission: RE | Admit: 2022-11-23 | Discharge: 2022-11-23 | Disposition: A | Discharge status: Home / Self Care | Payer: Medicare Other | Source: Ambulatory Visit | Attending: Neurology | Admitting: Neurology

## 2022-11-23 ENCOUNTER — Ambulatory Visit
Admission: RE | Admit: 2022-11-23 | Discharge: 2022-11-23 | Disposition: A | Discharge status: Home / Self Care | Payer: Medicare Other | Source: Ambulatory Visit | Attending: Neurology | Admitting: Neurology

## 2022-11-23 VITALS — BP 154/88 | HR 65

## 2022-11-23 DIAGNOSIS — G610 Guillain-Barre syndrome: Secondary | ICD-10-CM | POA: Diagnosis not present

## 2022-11-23 DIAGNOSIS — R836 Abnormal cytological findings in cerebrospinal fluid: Secondary | ICD-10-CM | POA: Diagnosis not present

## 2022-11-23 DIAGNOSIS — G629 Polyneuropathy, unspecified: Secondary | ICD-10-CM

## 2022-11-23 NOTE — Progress Notes (Signed)
1 vial of blood drawn from pts LAC to be sent off with [facility] lab work. 1 successful attempt, pt tolerated well. Gauze and tape applied after.

## 2022-11-27 LAB — CYTOLOGY - NON PAP

## 2022-11-27 NOTE — Progress Notes (Deleted)
THIS ENCOUNTER IS A VIRTUAL VISIT DUE TO COVID-19 - PATIENT WAS NOT SEEN IN THE OFFICE.  PATIENT HAS CONSENTED TO VIRTUAL VISIT / TELEMEDICINE VISIT   Virtual Visit via {[email or web address]} Note  I connected with  Debbie Houston on 11/28/2022 by {[email or web address]}.  I verified that I am speaking with the correct person using two identifiers.    I discussed the limitations of evaluation and management by telemedicine and the availability of in person appointments. The patient expressed understanding and agreed to proceed.  History of Present Illness:  There were no vitals taken for this visit. 82 y.o. patient contacted office reporting URI sx ***. she tested positive by ***. OV was conducted by telephone to minimize exposure. This patient *** vaccinated for covid 19, last ***  Sx began *** days ago with ***  Treatments tried so far: ***  Exposures: ***   Medications  Current Outpatient Medications (Endocrine & Metabolic):    levothyroxine (SYNTHROID) 125 MCG tablet, TAKE 1 TABLET BY MOUTH DAILY ON AN EMPTY STOMACH WITH ONLY WATER FOR 30 MINUTES(NO ANTACIDS MEDICATIONS, CALCIUM OR MAGNESIUM FOR 4 HOURS) (Patient taking differently: Take 125 mcg by mouth daily before breakfast.)   PROLIA 60 MG/ML SOSY injection, Inject 60 mg into the skin every 6 (six) months.   Current Outpatient Medications (Cardiovascular):    bisoprolol (ZEBETA) 5 MG tablet, TAKE 1/2 TABLET BY MOUTH EVERY DAY IN THE EVENING FOR BLOOD PRESSURE   colestipol (COLESTID) 1 g tablet, Take 4 g by mouth daily.   simvastatin (ZOCOR) 20 MG tablet, Take  1 tablet  at Bedtime  for Cholesterol                                                 /                        TAKE             BY MOUTH (Patient taking differently: Take 20 mg by mouth daily at 6 PM.)   Current Outpatient Medications (Respiratory):    loratadine (CLARITIN) 10 MG tablet, Take 10 mg by mouth daily as needed for allergies.   pseudoephedrine  (SUDAFED) 120 MG 12 hr tablet, Take  1 tablet  2 x /day (every 12 hours)  for Head and Chest Congestion (Patient taking differently: Take 120 mg by mouth daily as needed for congestion (Head and chest congestion).)   Current Outpatient Medications (Analgesics):    acetaminophen (TYLENOL) 500 MG tablet, Take 500 mg by mouth at bedtime as needed for moderate pain or headache.    aspirin EC 81 MG tablet, Take 1 tablet (81 mg total) by mouth in the morning and at bedtime. (Patient taking differently: Take 81 mg by mouth daily.)     Current Outpatient Medications (Other):    Ascorbic Acid (VITAMIN C) 1000 MG tablet, Take 1,000 mg by mouth daily.    Calcium Carbonate-Vit D-Min (CALCIUM 1200 PO), Take 1,200 mg by mouth daily.   CINNAMON PO, Take 2,000 mg by mouth daily.   Coenzyme Q10 (COQ10) 200 MG CAPS, Take 200 mg by mouth daily.   cycloSPORINE (RESTASIS) 0.05 % ophthalmic emulsion, Place 1 drop into both eyes 2 (two) times daily.   DULoxetine (CYMBALTA) 30 MG capsule, Take 1 capsule (30 mg total) by mouth 2 (  two) times daily.   loperamide (IMODIUM) 2 MG capsule, Take 4 mg by mouth 2 (two) times daily.   Mesalamine (ASACOL PO), Take by mouth. Take 2 tablets once daily   Multiple Vitamin (THERA VITAMIN PO), Take 1 tablet by mouth daily.   Omega-3 Fatty Acids (FISH OIL) 1200 MG CAPS, Take 1,200 mg by mouth daily.    pantoprazole (PROTONIX) 40 MG tablet, Take 1 tablet  2 x /day  for Heartburn & Indigestion (Patient taking differently: Take 40 mg by mouth 2 (two) times daily.)   Probiotic Product (PROBIOTIC PO), Take 1 capsule by mouth daily.  Current Facility-Administered Medications (Other):    inFLIXimab (REMICADE) 4.02 mg/kg = 200 mg in sodium chloride 0.9 % 250 mL infusion  Allergies:  Allergies  Allergen Reactions   Remicade [Infliximab]    Fosamax [Alendronate Sodium] Other (See Comments)    GI upset    Singulair [Montelukast Sodium] Other (See Comments)    Makes pt jittery    Wellbutrin [Bupropion] Hives   Clindamycin/Lincomycin Rash   Sulfa Antibiotics Rash    Problem list She has Essential hypertension; Hyperlipidemia, mixed; Hypothyroid; GERD; Asthma; DJD (degenerative joint disease); Vitamin D deficiency; Osteoporosis with current pathological fracture; Allergy; Spondylolisthesis at L4-L5 level; Bilateral temporomandibular joint pain; Senile purpura ([place]); Bilateral nephrolithiasis; Body mass index (BMI) of 19.0-19.9 in adult; Complete right bundle branch block (RBBB); History of ulcerative colitis; Intertrochanteric fracture of right femur, closed, with nonunion, subsequent encounter; Extensor tenosynovitis of right wrist; Thrombocytopenia ([place]); Prediabetes; and Sepsis secondary to UTI [facility]) on their problem list.   Social History:   reports that she quit smoking about 22 years ago. Her smoking use included cigarettes. She has a 3.75 pack-year smoking history. She has never been exposed to tobacco smoke. She has never used smokeless tobacco. She reports that she does not currently use alcohol. She reports that she does not use drugs.  Observations/Objective:  General : Well sounding patient in no apparent distress HEENT: no hoarseness, no cough for duration of visit Lungs: speaks in complete sentences, no audible wheezing, no apparent distress Neurological: alert, oriented x 3 Psychiatric: pleasant, judgement appropriate   Assessment and Plan:  Covid 19 Covid 19 positive per rapid screening test in *** Risk factors include: *** Symptoms are: mild*** Due to co morbid conditions and risk factors, discussed antivirals *** Immue support reviewed *** Take tylenol PRN temp 101+ Push hydration Regular ambulation or calf exercises exercises for clot prevention and 81 mg ASA unless contraindicated Sx supportive therapy suggested Follow up via mychart or telephone if needed Advised patient obtain O2 monitor; present to ED if persistently <90% or with severe  dyspnea, CP, fever uncontrolled by tylenol, confusion, sudden decline       Should remain in isolation 5 days from testing positive and then wear a mask when around other people for the following 5 days   Follow Up Instructions:  I discussed the assessment and treatment plan with the patient. The patient was provided an opportunity to ask questions and all were answered. The patient agreed with the plan and demonstrated an understanding of the instructions.   The patient was advised to call back or seek an in-person evaluation if the symptoms worsen or if the condition fails to improve as anticipated.  I provided *** minutes of non-face-to-face time during this encounter.   Alycia Rossetti, NP

## 2022-11-28 ENCOUNTER — Ambulatory Visit (INDEPENDENT_AMBULATORY_CARE_PROVIDER_SITE_OTHER): Payer: Medicare Other | Admitting: Nurse Practitioner

## 2022-11-28 ENCOUNTER — Encounter: Payer: Self-pay | Admitting: Nurse Practitioner

## 2022-11-28 ENCOUNTER — Ambulatory Visit: Payer: Medicare Other | Admitting: Nurse Practitioner

## 2022-11-28 VITALS — BP 149/77 | HR 100 | Temp 98.0°F

## 2022-11-28 DIAGNOSIS — U071 COVID-19: Secondary | ICD-10-CM | POA: Diagnosis not present

## 2022-11-28 MED ORDER — MOLNUPIRAVIR EUA 200MG CAPSULE: 4.0000 | ORAL_CAPSULE | Freq: Two times a day (BID) | ORAL | 0 refills | Status: AC

## 2022-11-28 NOTE — Progress Notes (Signed)
THIS ENCOUNTER IS A VIRTUAL VISIT DUE TO COVID-19 - PATIENT WAS NOT SEEN IN THE OFFICE.  PATIENT HAS CONSENTED TO VIRTUAL VISIT / TELEMEDICINE VISIT   Virtual Visit via telephone Note  I connected with  Debbie Houston on 11/28/2022 by telephone.  I verified that I am speaking with the correct person using two identifiers.    I discussed the limitations of evaluation and management by telemedicine and the availability of in person appointments. The patient expressed understanding and agreed to proceed.  History of Present Illness:  BP (!) 149/77   Pulse 100   Temp 98 F (36.7 C)   SpO2 97%  82 y.o. patient contacted office reporting URI sx fever, HA, body aches, SOB, nausea, yellow phlegm, nasal congestion, cough, fatigue, sore throat, chills. she tested positive by home test yesterday. OV was conducted by telephone to minimize exposure. This patient has vaccinated for covid 19, last 08/2022.  Sx began 2 days ago with "stuffiness."  Treatments tried so far: Tyelnol  Exposures: Possible hospital visit for lumbar puncture.   Medications  Current Outpatient Medications (Endocrine & Metabolic):    levothyroxine (SYNTHROID) 125 MCG tablet, TAKE 1 TABLET BY MOUTH DAILY ON AN EMPTY STOMACH WITH ONLY WATER FOR 30 MINUTES(NO ANTACIDS MEDICATIONS, CALCIUM OR MAGNESIUM FOR 4 HOURS) (Patient taking differently: Take 125 mcg by mouth daily before breakfast.)   PROLIA 60 MG/ML SOSY injection, Inject 60 mg into the skin every 6 (six) months.   Current Outpatient Medications (Cardiovascular):    bisoprolol (ZEBETA) 5 MG tablet, TAKE 1/2 TABLET BY MOUTH EVERY DAY IN THE EVENING FOR BLOOD PRESSURE   colestipol (COLESTID) 1 g tablet, Take 4 g by mouth daily.   simvastatin (ZOCOR) 20 MG tablet, Take  1 tablet  at Bedtime  for Cholesterol                                                 /                        TAKE             BY MOUTH (Patient taking differently: Take 20 mg by mouth daily at 6  PM.)   Current Outpatient Medications (Respiratory):    loratadine (CLARITIN) 10 MG tablet, Take 10 mg by mouth daily as needed for allergies.   pseudoephedrine (SUDAFED) 120 MG 12 hr tablet, Take  1 tablet  2 x /day (every 12 hours)  for Head and Chest Congestion (Patient taking differently: Take 120 mg by mouth daily as needed for congestion (Head and chest congestion).)   Current Outpatient Medications (Analgesics):    acetaminophen (TYLENOL) 500 MG tablet, Take 500 mg by mouth at bedtime as needed for moderate pain or headache.    aspirin EC 81 MG tablet, Take 1 tablet (81 mg total) by mouth in the morning and at bedtime. (Patient taking differently: Take 81 mg by mouth daily.)     Current Outpatient Medications (Other):    Ascorbic Acid (VITAMIN C) 1000 MG tablet, Take 1,000 mg by mouth daily.    Calcium Carbonate-Vit D-Min (CALCIUM 1200 PO), Take 1,200 mg by mouth daily.   CINNAMON PO, Take 2,000 mg by mouth daily.   Coenzyme Q10 (COQ10) 200 MG CAPS, Take 200 mg by mouth daily.  cycloSPORINE (RESTASIS) 0.05 % ophthalmic emulsion, Place 1 drop into both eyes 2 (two) times daily.   DULoxetine (CYMBALTA) 30 MG capsule, Take 1 capsule (30 mg total) by mouth 2 (two) times daily.   loperamide (IMODIUM) 2 MG capsule, Take 4 mg by mouth 2 (two) times daily.   Mesalamine (ASACOL PO), Take by mouth. Take 2 tablets once daily   Multiple Vitamin (THERA VITAMIN PO), Take 1 tablet by mouth daily.   Omega-3 Fatty Acids (FISH OIL) 1200 MG CAPS, Take 1,200 mg by mouth daily.    pantoprazole (PROTONIX) 40 MG tablet, Take 1 tablet  2 x /day  for Heartburn & Indigestion (Patient taking differently: Take 40 mg by mouth 2 (two) times daily.)   Probiotic Product (PROBIOTIC PO), Take 1 capsule by mouth daily.  Current Facility-Administered Medications (Other):    inFLIXimab (REMICADE) 4.02 mg/kg = 200 mg in sodium chloride 0.9 % 250 mL infusion  Allergies:  Allergies  Allergen Reactions   Remicade  [Infliximab]    Fosamax [Alendronate Sodium] Other (See Comments)    GI upset    Singulair [Montelukast Sodium] Other (See Comments)    Makes pt jittery   Wellbutrin [Bupropion] Hives   Clindamycin/Lincomycin Rash   Sulfa Antibiotics Rash    Problem list She has Essential hypertension; Hyperlipidemia, mixed; Hypothyroid; GERD; Asthma; DJD (degenerative joint disease); Vitamin D deficiency; Osteoporosis with current pathological fracture; Allergy; Spondylolisthesis at L4-L5 level; Bilateral temporomandibular joint pain; Senile purpura ([place]); Bilateral nephrolithiasis; Body mass index (BMI) of 19.0-19.9 in adult; Complete right bundle branch block (RBBB); History of ulcerative colitis; Intertrochanteric fracture of right femur, closed, with nonunion, subsequent encounter; Extensor tenosynovitis of right wrist; Thrombocytopenia ([place]); Prediabetes; and Sepsis secondary to UTI [facility]) on their problem list.   Social History:   reports that she quit smoking about 22 years ago. Her smoking use included cigarettes. She has a 3.75 pack-year smoking history. She has never been exposed to tobacco smoke. She has never used smokeless tobacco. She reports that she does not currently use alcohol. She reports that she does not use drugs.  Observations/Objective:  General : Well sounding patient in no apparent distress HEENT: no hoarseness, no cough for duration of visit Lungs: speaks in complete sentences, no audible wheezing, no apparent distress Neurological: alert, oriented x 3 Psychiatric: pleasant, judgement appropriate   Assessment and Plan:  Covid 19 Covid 19 positive per rapid screening test in home Risk factors include: Asthma,  Symptoms are: moderate Due to co morbid conditions and risk factors, discussed antivirals Molnupiravir Immue support reviewed Vitamin C, D and zinc Take tylenol PRN temp 101+ Push hydration Regular ambulation or calf exercises exercises for clot prevention and 81 mg  ASA unless contraindicated Sx supportive therapy suggested Follow up via mychart or telephone if needed Advised patient obtain O2 monitor; present to ED if persistently <90% or with severe dyspnea, CP, fever uncontrolled by tylenol, confusion, sudden decline       Should remain in isolation 5 days from testing positive and then wear a mask when around other people for the following 5 days  1. COVID-19 Positive  - molnupiravir EUA (LAGEVRIO) 200 mg CAPS capsule; Take 4 capsules (800 mg total) by mouth 2 (two) times daily for 5 days.  Dispense: 40 capsule; Refill: 0   Follow Up Instructions:  I discussed the assessment and treatment plan with the patient. The patient was provided an opportunity to ask questions and all were answered. The patient agreed with the plan  and demonstrated an understanding of the instructions.   The patient was advised to call back or seek an in-person evaluation if the symptoms worsen or if the condition fails to improve as anticipated.  I provided 15 minutes of non-face-to-face time during this encounter.   Darrol Jump, NP

## 2022-11-29 LAB — CSF CELL COUNT WITH DIFFERENTIAL
RBC Count, CSF: 2 cells/uL — ABNORMAL HIGH
TOTAL NUCLEATED CELL: 0 cells/uL (ref 0–5)

## 2022-11-29 LAB — GLUCOSE, CSF: Glucose, CSF: 74 mg/dL (ref 40–80)

## 2022-11-29 LAB — ANGIOTENSIN CONVERTING ENZYME, CSF: ANGIOTENSIN CONVERTING ENZYME ( ACE) CSF: 7 U/L (ref <=15)

## 2022-11-29 LAB — LYME DISEASE ABS IGG, IGM, IFA, CSF
Lyme Disease AB (IgG), IBL: NOT DETECTED
Lyme Disease AB (IgM), IBL: NOT DETECTED

## 2022-11-29 LAB — MYELIN BASIC PROTEIN, CSF: Myelin Basic Protein: <2 mcg/L (ref <=4.0)

## 2022-11-29 LAB — PROTEIN, CSF: Total Protein, CSF: 46 mg/dL (ref 15–60)

## 2022-11-29 LAB — OLIGOCLONAL BANDS, CSF + SERM: Oligo Bands: ABSENT

## 2022-11-30 ENCOUNTER — Telehealth: Payer: Self-pay | Admitting: Neurology

## 2022-11-30 NOTE — Telephone Encounter (Signed)
[facility] is calling with critical lab results

## 2022-11-30 NOTE — Telephone Encounter (Signed)
Called [facility] and they stated that their is no critical results for patient and [facility] may have worded it wrong and meant to say STAT. They will fax labs over for Dr. Posey Pronto. Made Dr. Posey Pronto aware of this.

## 2022-12-03 ENCOUNTER — Ambulatory Visit: Payer: Medicare Other

## 2022-12-10 ENCOUNTER — Ambulatory Visit: Payer: Medicare Other | Admitting: Neurology

## 2022-12-14 MED ORDER — PREDNISONE 10 MG TABLET
ORAL_TABLET | Freq: Every day | ORAL | 0 refills | 28 days | Status: CP
Start: 2022-12-14 — End: 2023-01-11

## 2022-12-17 ENCOUNTER — Ambulatory Visit (INDEPENDENT_AMBULATORY_CARE_PROVIDER_SITE_OTHER): Payer: Medicare Other

## 2022-12-17 DIAGNOSIS — M8000XG Age-related osteoporosis with current pathological fracture, unspecified site, subsequent encounter for fracture with delayed healing: Secondary | ICD-10-CM

## 2022-12-17 MED ORDER — ROMOSOZUMAB-AQQG 105 MG/1.17ML ~~LOC~~ SOSY
210.0000 mg | PREFILLED_SYRINGE | Freq: Once | SUBCUTANEOUS | Status: AC
  Administered 2022-12-17: 210 mg via SUBCUTANEOUS

## 2022-12-17 NOTE — Progress Notes (Signed)
Patient is here for nurse visit for her 9th Evenity injection. Patient received bilateral [place] [employer] arm injections. She tolerated injections well. She will return in 1 month for her next Evenity injection.

## 2022-12-18 ENCOUNTER — Telehealth: Admit: 2022-12-18 | Discharge: 2022-12-19 | Payer: MEDICARE | Attending: Internal Medicine | Primary: Internal Medicine

## 2022-12-18 DIAGNOSIS — K5100 Ulcerative (chronic) pancolitis without complications: Secondary | ICD-10-CM | POA: Diagnosis not present

## 2022-12-18 MED ORDER — VANCOMYCIN 125 MG CAPSULE
ORAL_CAPSULE | Freq: Three times a day (TID) | ORAL | 0 refills | 30 days | Status: CP
Start: 2022-12-18 — End: ?

## 2022-12-19 ENCOUNTER — Ambulatory Visit (INDEPENDENT_AMBULATORY_CARE_PROVIDER_SITE_OTHER): Payer: Medicare Other | Admitting: Neurology

## 2022-12-19 ENCOUNTER — Encounter: Payer: Self-pay | Admitting: Neurology

## 2022-12-19 VITALS — BP 153/92 | HR 81 | Ht 65.0 in | Wt 104.0 lb

## 2022-12-19 DIAGNOSIS — G629 Polyneuropathy, unspecified: Secondary | ICD-10-CM | POA: Diagnosis not present

## 2022-12-19 NOTE — Patient Instructions (Signed)
Continue Cymbalta '30mg'$  twice daily  Return to clinic in 4 months

## 2022-12-19 NOTE — Progress Notes (Signed)
Follow-up Visit   Date: 12/19/2022    Debbie Houston MRN: [id number] DOB: 1941-05-18    Debbie Houston is a 82 y.o. left-handed Caucasian female with ulcerative colitis, hyperlipidemia, GERD, asthma, hypothyroidism, OA, cervical spondylosis, lumbar spondylolisthesis at L4-5 (followed by Dr. Ellene Route)  returning to the clinic for follow-up of bilateral hand and feet pain/paresthesias.  The patient was accompanied to the clinic by self.  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 CSF testing for polyradiculoneuropathy returned normal.  She has a recent flare of colitis and is on prednisone for this.  Patiend was asked to monitor symptoms while on prednisone and if there is any improvement, we can consider trial of solumedrol.  In the meantime, continue Cymbalta 30 twice daily.  Safety precautions at home discussed.   Return to clinic in 4 months, or sooner as needed  --------------------------------------------- History of present illness: She received Remicade infusion for Crohn's colitis in July and following this developed UTI.  She was hospitalized in August for urosepsis.  While hospitalized, she began having numbness, tingling, and sharp shooting pain over the hands and feet.  Numbness is constant and she has episodic sharp pain.  She tried gabapentin '200mg'$  but stopped this due to dizziness.   She complains of weakness with difficulty with opening jars/grip.  She has some imbalance, walks unassisted and does not use a cane. Over the past three months, symptoms remain unchanged.  She denies having any numbness/tingling prior to her hospitalization.    She lives at home with husband and high-functioning autistic son.   UPDATE 10/31/2022:  She is here for follow-up and discuss results of EMG and labs.  There has been no change in the numbness of her hands. She continues to have  difficulty with holding onto objects and frequently drops them.  She also endorse imbalance and had one fall which results in bruise over her right eye.  Tingling has improved slightly on Cymbalta '30mg'$  and she would like to consider increasing the dose. No new complaints.    UPDATE 12/19/2022:  She is here for follow-up. CSF testing returned normal without signs of inflammation.  She continues have numbness of the hands and reports droping things frequently.   She denies significant tingling or burning of the hands.  She is tolerating Cymbalta '30mg'$  twice daily.  Over the past few weeks, her colitis has been giving her more problems and she is currently on a steroid taper.   Medications:  Current Outpatient Medications on File Prior to Visit  Medication Sig Dispense Refill   acetaminophen (TYLENOL) 500 MG tablet Take 500 mg by mouth at bedtime as needed for moderate pain or headache.      amoxicillin (AMOXIL) 500 MG tablet SMARTSIG:4 Tablet(s) By Mouth     Ascorbic Acid (VITAMIN C) 1000 MG tablet Take 1,000 mg by mouth daily.      aspirin EC 81 MG tablet Take 1 tablet (81 mg total) by mouth in the morning and at bedtime. (Patient taking differently: Take 81 mg by mouth daily.) 60 tablet 0   bisoprolol (ZEBETA) 5 MG tablet TAKE 1/2 TABLET BY MOUTH EVERY DAY IN THE EVENING FOR BLOOD PRESSURE 45 tablet 3   Calcium Carbonate-Vit D-Min (CALCIUM 1200 PO) Take 1,200 mg by mouth daily.     CINNAMON PO Take 2,000 mg by mouth daily.     Coenzyme Q10 (COQ10) 200 MG CAPS Take  200 mg by mouth daily.     colestipol (COLESTID) 1 g tablet Take 4 g by mouth daily.     cycloSPORINE (RESTASIS) 0.05 % ophthalmic emulsion Place 1 drop into both eyes 2 (two) times daily.     DULoxetine (CYMBALTA) 30 MG capsule Take 1 capsule (30 mg total) by mouth 2 (two) times daily. 180 capsule 3   levothyroxine (SYNTHROID) 125 MCG tablet TAKE 1 TABLET BY MOUTH DAILY ON AN EMPTY STOMACH WITH ONLY WATER FOR 30 MINUTES(NO ANTACIDS  MEDICATIONS, CALCIUM OR MAGNESIUM FOR 4 HOURS) (Patient taking differently: Take 125 mcg by mouth daily before breakfast.) 90 tablet 0   loperamide (IMODIUM) 2 MG capsule Take 4 mg by mouth 2 (two) times daily.     loratadine (CLARITIN) 10 MG tablet Take 10 mg by mouth daily as needed for allergies.     Mesalamine (ASACOL PO) Take by mouth. Take 2 tablets once daily     Multiple Vitamin (THERA VITAMIN PO) Take 1 tablet by mouth daily.     Omega-3 Fatty Acids (FISH OIL) 1200 MG CAPS Take 1,200 mg by mouth daily.      pantoprazole (PROTONIX) 40 MG tablet Take 1 tablet  2 x /day  for Heartburn & Indigestion (Patient taking differently: Take 40 mg by mouth 2 (two) times daily.) 180 tablet 0   predniSONE (DELTASONE) 10 MG tablet Take by mouth.     Probiotic Product (PROBIOTIC PO) Take 1 capsule by mouth daily.     PROLIA 60 MG/ML SOSY injection Inject 60 mg into the skin every 6 (six) months.     pseudoephedrine (SUDAFED) 120 MG 12 hr tablet Take  1 tablet  2 x /day (every 12 hours)  for Head and Chest Congestion (Patient taking differently: Take 120 mg by mouth daily as needed for congestion (Head and chest congestion).) 60 tablet 0   simvastatin (ZOCOR) 20 MG tablet Take  1 tablet  at Bedtime  for Cholesterol                                                 /                        TAKE             BY MOUTH (Patient taking differently: Take 20 mg by mouth daily at 6 PM.) 90 tablet 3   Current Facility-Administered Medications on File Prior to Visit  Medication Dose Route Frequency Provider Last Rate Last Admin   inFLIXimab (REMICADE) 4.02 mg/kg = 200 mg in sodium chloride 0.9 % 250 mL infusion  4.02 mg/kg Intravenous Once Unk Pinto, MD        Allergies:  Allergies  Allergen Reactions   Remicade [Infliximab]    Fosamax [Alendronate Sodium] Other (See Comments)    GI upset    Singulair [Montelukast Sodium] Other (See Comments)    Makes pt jittery   Wellbutrin [Bupropion] Hives    Clindamycin/Lincomycin Rash   Sulfa Antibiotics Rash    Vital Signs:  BP (!) 153/92   Pulse 81   Ht '5\' 5"'$  (1.651 m)   Wt 104 lb (47.2 kg)   SpO2 98%   BMI 17.31 kg/m   Neurological Exam: MENTAL STATUS including orientation to time, place, person, recent and remote memory, attention span  and concentration, language, and fund of knowledge is normal.  Speech is not dysarthric.  CRANIAL NERVES:   Pupils equal round and reactive to light.  Normal conjugate, extra-ocular eye movements in all directions of gaze.  No ptosis .  Face is symmetric.   MOTOR:  Motor strength is 5/5 in all extremities, except 4/5 intrinsic hand muscles.  Generalized loss of muscle bulk with bilateral ABP atrophy, fasciculations or abnormal movements.  No pronator drift.  Tone is normal.    MSRs:  Reflexes are 2+/4 throughout, except absent at the ankles.  SENSORY:  Intact to vibration throughout, except trace at the ankles bilaterally.  COORDINATION/GAIT:  Intact rapid alternating movements bilaterally.  Gait wide based and slightly unstable, unassisted  Data: Labs 10/22/2022:  CRP < 1.0, ESR 19, folate > 23, copper 108, SPEP with IFE poorly defined IgG and kappa  Lab Results  Component Value Date   VITAMINB12 681 07/10/2022    NCS/EMG of the arms 10/09/2022: This is a complex study.  Findings are most suggestive of a non-length dependent and chronic sensorimotor polyneuropathy, with axonal and demyelinating features. Alternatively, a polyneuropathy with superimposed median and ulnar entrapment neuropathies in the upper extremity is possible.  Nerve ultrasound recommended to further evaluate. Chronic L5 radiculopathy affecting the right lower extremity, mild.  CSF 11/23/2022:  R2 W0 G74 P46  OCB absent, MBP < 2.0, Lyme neg, ACE neg, cytology neg   Thank you for allowing me to participate in patient's care.  If I can answer any additional questions, I would be pleased to do so.    Sincerely,    Renesmay Nesbitt K.  Posey Pronto, DO

## 2022-12-20 DIAGNOSIS — K5100 Ulcerative (chronic) pancolitis without complications: Principal | ICD-10-CM

## 2022-12-20 MED ORDER — USTEKINUMAB 90 MG/ML SUBCUTANEOUS SYRINGE
SUBCUTANEOUS | 2 refills | 56 days | Status: CP
Start: 2022-12-20 — End: ?

## 2022-12-24 ENCOUNTER — Encounter: Payer: Self-pay | Admitting: Internal Medicine

## 2022-12-24 NOTE — Patient Instructions (Signed)

## 2022-12-24 NOTE — Progress Notes (Signed)
Future Appointments  Date Time Provider Department  12/25/2022                         6 mo ov 10:30 AM Unk Pinto, MD [facility]  03/14/2023  1:40 PM Bo Merino, MD [facility]  04/22/2023  2:30 PM Narda Amber K, DO [facility]  04/29/2023 10:30 AM Rex Kras, DO [facility]  06/25/2023                         cpe  2:00 PM Unk Pinto, MD [facility]    History of Present Illness:       This very nice 82 y.o. MWF presents for 6 month follow up with HTN, HLD, Pre-Diabetes and Vitamin D Deficiency.  Patient is followed by GI for GERD & is on Asacol  & Prednisone  Collagenous Colitis.    Dr Corey Harold follows patient for polyradiculoneuropathy of hands & feet.        Patient is treated for HTN circa 2015 & BP has been controlled at home. Today's BP is at goal -  136/80 .   Patient has had no complaints of any cardiac type chest pain, palpitations, dyspnea Vertell Limber /PND, dizziness, claudication or dependent edema.       Hyperlipidemia is controlled with diet & Zocor Patient denies myalgias or other med SE's. Last Lipids were at goal :  Lab Results  Component Value Date   CHOL 150 09/18/2022   HDL 55 09/18/2022   LDLCALC 76 09/18/2022   TRIG 108 09/18/2022   CHOLHDL 2.7 09/18/2022     Also, the patient has history of PreDiabetes (A1c 5.7% /2019)  and has had no symptoms of reactive hypoglycemia, diabetic polys, paresthesias or visual blurring.  Last A1c was near goal :  Lab Results  Component Value Date   HGBA1C 5.7 (H) 06/18/2022                                                          Further, the patient also has history of Vitamin D Deficiency and supplements vitamin D without any suspected side-effects. Last vitamin D was at goal :   Lab Results  Component Value Date   VD25OH 78 06/18/2022       Current Outpatient Medications  Medication Instructions   acetaminophen (TYLENOL) 500 mg, Oral, At bedtime PRN   amoxicillin (AMOXIL) 500 MG tablet SMARTSIG:4  Tablet(s) By Mouth   aspirin EC  81 mg , Oral, 2 times daily   bisoprolol (ZEBETA) 5 MG tablet TAKE 1/2 TABLET EVERY DAY    CALCIUM 1200 1,200 mg, Oral, Daily   CINNAMON  2,000 mg, Oral, Daily   colestipol  4 g, Oral, Daily   CoQ10 200 mg, Oral, Daily   RESTASIS 0.05 % opth emulsion 1 drop, Both Eyes, 2 times daily   DULoxetine (CYMBALTA) 30 mg,2 times daily   Fish Oil 1,200 mg, Oral, Daily   levothyroxine  125 MCG tablet TAKE 1 TABLET DAILY    loperamide (IMODIUM) 4 mg, Oral, 2 times daily   loratadine (CLARITIN) 10 mg, Oral, Daily PRN   Mesalamine (ASACOL PO) Oral, Take 2 tablets once daily   Multiple Vitamin (THERA VITAMIN) 1 tablet, Oral, Daily   pantoprazole (  PROTONIX) 40 MG tablet Take 1 tablet  2 x /day  for Heartburn & Indigestion   predniSONE  10 MG tablet Oral   PROBIOTIC 1 capsule, Oral, Daily   Prolia 60 mg, Subcutaneous, Every 6 months   pseudoephedrine 120 MG 12 hr tablet Take  1 tablet  2 x /day (every 12 hours)  for Head and Chest Congestion   simvastatin (ZOCOR) 20 MG tablet Take  1 tablet  at Bedtime   vitamin C 1,000 mg, Oral, Daily     Allergies  Allergen Reactions   Fosamax [Alendronate Sodium] Other (See Comments)    GI upset    Singulair [Montelukast Sodium] Other (See Comments)    Makes pt jittery   Wellbutrin [Bupropion] Hives   Clindamycin/Lincomycin Rash   Sulfa Antibiotics Rash     PMHx:   Past Medical History:  Diagnosis Date   Arthritis    Benign labile hypertension    Cancer (HCC)    skin cancer on face   Colitis    DJD (degenerative joint disease)    Endometrial polyp    GERD (gastroesophageal reflux disease)    History of basal cell carcinoma excision    History of esophagitis    HOH (hard of hearing)    right ear better than left  -- wears no aides   Hyperlipidemia    Hypothyroidism    Osteopenia    Prediabetes    S/P right hip fracture 08/17/2020   Vitamin D deficiency    Wears glasses      Immunization History   Administered Date(s) Administered   Influenza, High Dose Seasonal PF 07/23/2014, 08/23/2015, 07/10/2016, 08/27/2017, 08/22/2018, 08/25/2019, 08/30/2020, 08/21/2021   PFIZER SARS-COV-2 Vaccination 12/10/2019, 12/31/2019, 08/16/2020, 04/14/2021   Pneumococcal -13 05/05/2015   Pneumococcal-23 08/22/2011   Tdap 08/22/2011   Zoster Recombinat (Shingrix) 09/13/2017, 01/01/2018   Zoster, Live 06/20/2006     Past Surgical History:  Procedure Laterality Date   BACK SURGERY  2020   CATARACT EXTRACTION, BILATERAL Bilateral 2020   DILATATION & CURETTAGE/HYSTEROSCOPY WITH MYOSURE N/A 10/27/2014   Procedure: [place];  Surgeon: Darlyn Chamber, MD;  Location: [facility];  Service: Gynecology;  Laterality: N/A;   DILATION AND CURETTAGE OF UTERUS     HIP SURGERY Right 04/2020   rod and screw in right hip    MOHS SURGERY  2005   LEFT NASAL BRIDGE FOR BASAL CELL   TONSILLECTOMY  age 83    FHx:    Reviewed / unchanged  SHx:    Reviewed / unchanged   Systems Review:  Constitutional: Denies fever, chills, wt changes, headaches, insomnia, fatigue, night sweats, change in appetite. Eyes: Denies redness, blurred vision, diplopia, discharge, itchy, watery eyes.  ENT: Denies discharge, congestion, post nasal drip, epistaxis, sore throat, earache, hearing loss, dental pain, tinnitus, vertigo, sinus pain, snoring.  CV: Denies chest pain, palpitations, irregular heartbeat, syncope, dyspnea, diaphoresis, orthopnea, PND, claudication or edema. Respiratory: denies cough, dyspnea, DOE, pleurisy, hoarseness, laryngitis, wheezing.  Gastrointestinal: Denies dysphagia, odynophagia, heartburn, reflux, water brash, abdominal pain or cramps, nausea, vomiting, bloating, diarrhea, constipation, hematemesis, melena, hematochezia  or hemorrhoids. Genitourinary: Denies dysuria, frequency, urgency, nocturia, hesitancy, discharge, hematuria or flank pain. Musculoskeletal:  Denies arthralgias, myalgias, stiffness, jt. swelling, pain, limping or strain/sprain.  Skin: Denies pruritus, rash, hives, warts, acne, eczema or change in skin lesion(s). Neuro: No weakness, tremor, incoordination, spasms, paresthesia or pain. Psychiatric: Denies confusion, memory loss or sensory loss. Endo: Denies change  in weight, skin or hair change.  Heme/Lymph: No excessive bleeding, bruising or enlarged lymph nodes.  Physical Exam  BP 136/80   Pulse 66   Temp 97.6 F (36.4 C)   Resp 16   Ht 5' 5"$  (1.651 m)   Wt 105 lb 12.8 oz (48 kg)   SpO2 99%   BMI 17.61 kg/m   Appears  well nourished, well groomed  and in no distress.  Eyes: PERRLA, EOMs, conjunctiva no swelling or erythema. Sinuses: No frontal/maxillary tenderness ENT/Mouth: EAC's clear, TM's nl w/o erythema, bulging. Nares clear w/o erythema, swelling, exudates. Oropharynx clear without erythema or exudates. Oral hygiene is good. Tongue normal, non obstructing. Hearing intact.  Neck: Supple. Thyroid not palpable. Car 2+/2+ without bruits, nodes or JVD. Chest: Respirations nl with BS clear & equal w/o rales, rhonchi, wheezing or stridor.  Cor: Heart sounds normal w/ regular rate and rhythm without sig. murmurs, gallops, clicks or rubs. Peripheral pulses normal and equal  without edema.  Abdomen: Soft & bowel sounds normal. Non-tender w/o guarding, rebound, hernias, masses or organomegaly.  Lymphatics: Unremarkable.  Musculoskeletal: Full ROM all peripheral extremities, joint stability, 5/5 strength and normal gait.  Skin: Warm, dry without exposed rashes, lesions or ecchymosis apparent.  Neuro: Cranial nerves intact, reflexes equal bilaterally. Sensory-motor testing grossly intact. Tendon reflexes grossly intact.  Pysch: Alert & oriented x 3.  Insight and judgement nl & appropriate. No ideations.  Assessment and Plan:  1. Essential hypertension  - Continue medication, monitor blood pressure at home.  - Continue  DASH diet.  Reminder to go to the ER if any CP,  SOB, nausea, dizziness, severe HA, changes vision/speech.     - CBC with Differential/Platelet - COMPLETE METABOLIC PANEL WITH GFR - Magnesium - TSH  2. Hyperlipidemia, mixed  - Continue diet/meds, exercise,& lifestyle modifications.  - Continue monitor periodic cholesterol/liver & renal functions    - Lipid panel - TSH  3. Abnormal glucose  - Continue diet, exercise  - Lifestyle modifications.  - Monitor appropriate labs    - Hemoglobin A1c - Insulin, random  4. Vitamin D deficiency  - Continue supplementation. - VITAMIN D 25 Hydroxy   5. Medication management  - CBC with Differential/Platelet - COMPLETE METABOLIC PANEL WITH GFR - Magnesium - Lipid panel - TSH - Hemoglobin A1c - Insulin, random - VITAMIN D 25 Hydroxy          Discussed  regular exercise, BP monitoring, weight control to achieve/maintain BMI less than 25 and discussed med and SE's. Recommended labs to assess and monitor clinical status with further disposition pending results of labs.  I discussed the assessment and treatment plan with the patient. The patient was provided an opportunity to ask questions and all were answered. The patient agreed with the plan and demonstrated an understanding of the instructions.  I provided over 30 minutes of exam, counseling, chart review and  complex critical decision making.        The patient was advised to call back or seek an in-person evaluation if the symptoms worsen or if the condition fails to improve as anticipated.   Kirtland Bouchard, MD.

## 2022-12-25 ENCOUNTER — Ambulatory Visit (INDEPENDENT_AMBULATORY_CARE_PROVIDER_SITE_OTHER): Payer: Medicare Other | Admitting: Internal Medicine

## 2022-12-25 ENCOUNTER — Encounter: Payer: Self-pay | Admitting: Internal Medicine

## 2022-12-25 VITALS — BP 136/80 | HR 66 | Temp 97.6°F | Resp 16 | Ht 65.0 in | Wt 105.8 lb

## 2022-12-25 DIAGNOSIS — K52831 Collagenous colitis: Secondary | ICD-10-CM

## 2022-12-25 DIAGNOSIS — R7309 Other abnormal glucose: Secondary | ICD-10-CM

## 2022-12-25 DIAGNOSIS — Z79899 Other long term (current) drug therapy: Secondary | ICD-10-CM | POA: Diagnosis not present

## 2022-12-25 DIAGNOSIS — R300 Dysuria: Secondary | ICD-10-CM | POA: Diagnosis not present

## 2022-12-25 DIAGNOSIS — E559 Vitamin D deficiency, unspecified: Secondary | ICD-10-CM

## 2022-12-25 DIAGNOSIS — E782 Mixed hyperlipidemia: Secondary | ICD-10-CM | POA: Diagnosis not present

## 2022-12-25 DIAGNOSIS — I10 Essential (primary) hypertension: Secondary | ICD-10-CM

## 2022-12-25 MED ORDER — PREDNISONE 10 MG PO TABS: ORAL_TABLET | ORAL | Status: DC

## 2022-12-25 MED ORDER — MESALAMINE 800 MG PO TBEC: DELAYED_RELEASE_TABLET | ORAL | Status: DC

## 2022-12-25 MED ORDER — BISOPROLOL FUMARATE 5 MG PO TABS: ORAL_TABLET | ORAL | 3 refills | Status: DC

## 2022-12-25 MED ORDER — VANCOMYCIN HCL 125 MG PO CAPS: ORAL_CAPSULE | ORAL | Status: DC

## 2022-12-26 DIAGNOSIS — K5100 Ulcerative (chronic) pancolitis without complications: Principal | ICD-10-CM

## 2022-12-26 LAB — URINE CULTURE
MICRO NUMBER:: 14526077
SPECIMEN QUALITY:: ADEQUATE

## 2022-12-26 LAB — MAGNESIUM: Magnesium: 1.9 mg/dL (ref 1.5–2.5)

## 2022-12-26 LAB — CBC WITH DIFFERENTIAL/PLATELET
Absolute Monocytes: 498 cells/uL (ref 200–950)
Basophils Absolute: 42 cells/uL (ref 0–200)
Basophils Relative: 0.5 %
Eosinophils Absolute: 91 cells/uL (ref 15–500)
Eosinophils Relative: 1.1 %
HCT: 37.8 % (ref 35.0–45.0)
Hemoglobin: 12.6 g/dL (ref 11.7–15.5)
Lymphs Abs: 905 cells/uL (ref 850–3900)
MCH: 31.7 pg (ref 27.0–33.0)
MCHC: 33.3 g/dL (ref 32.0–36.0)
MCV: 95 fL (ref 80.0–100.0)
MPV: 10.4 fL (ref 7.5–12.5)
Monocytes Relative: 6 %
Neutro Abs: 6765 cells/uL (ref 1500–7800)
Neutrophils Relative %: 81.5 %
Platelets: 259 10*3/uL (ref 140–400)
RBC: 3.98 10*6/uL (ref 3.80–5.10)
RDW: 14.7 % (ref 11.0–15.0)
Total Lymphocyte: 10.9 %
WBC: 8.3 10*3/uL (ref 3.8–10.8)

## 2022-12-26 LAB — COMPLETE METABOLIC PANEL WITH GFR
AG Ratio: 2 (calc) (ref 1.0–2.5)
ALT: 14 U/L (ref 6–29)
AST: 16 U/L (ref 10–35)
Albumin: 4.3 g/dL (ref 3.6–5.1)
Alkaline phosphatase (APISO): 71 U/L (ref 37–153)
BUN: 11 mg/dL (ref 7–25)
CO2: 28 mmol/L (ref 20–32)
Calcium: 9.5 mg/dL (ref 8.6–10.4)
Chloride: 101 mmol/L (ref 98–110)
Creat: 0.79 mg/dL (ref 0.60–0.95)
Globulin: 2.2 g/dL (calc) (ref 1.9–3.7)
Glucose, Bld: 83 mg/dL (ref 65–99)
Potassium: 4.4 mmol/L (ref 3.5–5.3)
Sodium: 136 mmol/L (ref 135–146)
Total Bilirubin: 0.4 mg/dL (ref 0.2–1.2)
Total Protein: 6.5 g/dL (ref 6.1–8.1)
eGFR: 75 mL/min/{1.73_m2} (ref >=60)

## 2022-12-26 LAB — INSULIN, RANDOM: Insulin: 4.2 u[IU]/mL

## 2022-12-26 LAB — LIPID PANEL
Cholesterol: 167 mg/dL (ref <200)
HDL: 70 mg/dL (ref >=50)
LDL Cholesterol (Calc): 75 mg/dL (calc)
Non-HDL Cholesterol (Calc): 97 mg/dL (calc) (ref <130)
Total CHOL/HDL Ratio: 2.4 (calc) (ref <5.0)
Triglycerides: 139 mg/dL (ref <150)

## 2022-12-26 LAB — URINALYSIS, ROUTINE W REFLEX MICROSCOPIC
Bilirubin Urine: NEGATIVE
Glucose, UA: NEGATIVE
Hgb urine dipstick: NEGATIVE
Ketones, ur: NEGATIVE
Leukocytes,Ua: NEGATIVE
Nitrite: NEGATIVE
Protein, ur: NEGATIVE
Specific Gravity, Urine: 1.003 (ref 1.001–1.035)
pH: 7.5 (ref 5.0–8.0)

## 2022-12-26 LAB — TSH: TSH: 1.49 mIU/L (ref 0.40–4.50)

## 2022-12-26 LAB — HEMOGLOBIN A1C
Hgb A1c MFr Bld: 5.7 % of total Hgb — ABNORMAL HIGH (ref <5.7)
Mean Plasma Glucose: 117 mg/dL
eAG (mmol/L): 6.5 mmol/L

## 2022-12-26 LAB — VITAMIN D 25 HYDROXY (VIT D DEFICIENCY, FRACTURES): Vit D, 25-Hydroxy: 59 ng/mL (ref 30–100)

## 2022-12-26 NOTE — Progress Notes (Signed)
<><><><><><><><><><><><><><><><><><><><><><><><><><><><><><><><><> <><><><><><><><><><><><><><><><><><><><><><><><><><><><><><><><><> -   Test results slightly outside the reference range are not unusual. If there is anything important, I will review this with you,  otherwise it is considered normal test values.  If you have further questions,  please do not hesitate to contact me at the office or via My Chart.  <><><><><><><><><><><><><><><><><><><><><><><><><><><><><><><><><> <><><><><><><><><><><><><><><><><><><><><><><><><><><><><><><><><>  -  A1c = 5.& %  - Still borderline  -  sl elevated Blood sugar   So . . .  Marland Kitchen  - Avoid Sweets, Candy & White Stuff   - White Rice, [place], [place] Flour  - Breads &  Pasta <><><><><><><><><><><><><><><><><><><><><><><><><><><><><><><><><> <><><><><><><><><><><><><><><><><><><><><><><><><><><><><><><><><>   - Urine culture - is Negative = No Infection.  <><><><><><><><><><><><><><><><><><><><><><><><><><><><><><><><><> <><><><><><><><><><><><><><><><><><><><><><><><><><><><><><><><><>  -

## 2022-12-30 ENCOUNTER — Encounter: Payer: Self-pay | Admitting: Internal Medicine

## 2023-01-15 ENCOUNTER — Ambulatory Visit: Admit: 2023-01-15 | Discharge: 2023-01-16 | Payer: MEDICARE

## 2023-01-15 DIAGNOSIS — K5100 Ulcerative (chronic) pancolitis without complications: Secondary | ICD-10-CM | POA: Diagnosis not present

## 2023-01-17 DIAGNOSIS — K5100 Ulcerative (chronic) pancolitis without complications: Principal | ICD-10-CM

## 2023-01-17 DIAGNOSIS — Z1231 Encounter for screening mammogram for malignant neoplasm of breast: Secondary | ICD-10-CM | POA: Diagnosis not present

## 2023-01-17 LAB — HM MAMMOGRAPHY

## 2023-01-18 ENCOUNTER — Ambulatory Visit (INDEPENDENT_AMBULATORY_CARE_PROVIDER_SITE_OTHER): Payer: Medicare Other

## 2023-01-18 DIAGNOSIS — M8000XG Age-related osteoporosis with current pathological fracture, unspecified site, subsequent encounter for fracture with delayed healing: Secondary | ICD-10-CM | POA: Diagnosis not present

## 2023-01-18 MED ORDER — ROMOSOZUMAB-AQQG 105 MG/1.17ML ~~LOC~~ SOSY
210.0000 mg | PREFILLED_SYRINGE | Freq: Once | SUBCUTANEOUS | Status: AC
  Administered 2023-01-18: 210 mg via SUBCUTANEOUS

## 2023-01-18 NOTE — Progress Notes (Signed)
Patient is here for nurse visit for her 10th Evenity injection. Patient received bilateral [place] arm injections. She tolerated injections well. She will return in 1 month for her next Evenity injection.

## 2023-01-21 ENCOUNTER — Other Ambulatory Visit: Payer: Self-pay

## 2023-01-21 ENCOUNTER — Ambulatory Visit: Payer: Medicare Other | Admitting: Neurology

## 2023-01-21 ENCOUNTER — Encounter: Payer: Self-pay | Admitting: Nurse Practitioner

## 2023-01-21 ENCOUNTER — Ambulatory Visit (INDEPENDENT_AMBULATORY_CARE_PROVIDER_SITE_OTHER): Payer: Medicare Other | Admitting: Nurse Practitioner

## 2023-01-21 VITALS — BP 124/70 | HR 87 | Temp 97.7°F | Ht 65.0 in | Wt 109.6 lb

## 2023-01-21 DIAGNOSIS — Z1152 Encounter for screening for COVID-19: Secondary | ICD-10-CM | POA: Diagnosis not present

## 2023-01-21 DIAGNOSIS — K5100 Ulcerative (chronic) pancolitis without complications: Secondary | ICD-10-CM | POA: Diagnosis not present

## 2023-01-21 DIAGNOSIS — R6889 Other general symptoms and signs: Secondary | ICD-10-CM | POA: Diagnosis not present

## 2023-01-21 DIAGNOSIS — T887XXA Unspecified adverse effect of drug or medicament, initial encounter: Secondary | ICD-10-CM

## 2023-01-21 LAB — POC COVID19 BINAXNOW: SARS Coronavirus 2 Ag: NEGATIVE

## 2023-01-21 LAB — POCT INFLUENZA A/B
Influenza A, POC: NEGATIVE
Influenza B, POC: NEGATIVE

## 2023-01-21 NOTE — Progress Notes (Signed)
Assessment and Plan:  Debbie Houston was seen today for acute visit.  Diagnoses and all orders for this visit:  Flu-like symptoms -     POCT Influenza A/B- neg  Encounter for screening for COVID-19 -     POC COVID-19- neg  Ulcerative pancolitis without complication (HCC)/Medication side effect  Follows with Dr. Laverta Baltimore Was started on Stelara 1 week ago- has been having dizziness, headache, abdominal pain, joint pain, vomiting , low grade fever and runny nose Physical exam is negative for URI, sinusitis, covid and flu Believe could be side effects from medication Stelara- advised to call GI for further treatment        Further disposition pending results of labs. Discussed med's effects and SE's.   Over 30 minutes of exam, counseling, chart review, and critical decision making was performed.   Future Appointments  Date Time Provider [place]  02/20/2023 11:30 AM [facility] NURSE [facility]  03/14/2023  1:40 PM Bo Merino, MD [facility] None  03/25/2023 11:00 AM Alycia Rossetti, NP [facility] None  04/22/2023  2:30 PM Narda Amber K, DO [facility] None  04/29/2023 10:30 AM Rex Kras, DO [facility] None  06/25/2023  2:00 PM Unk Pinto, MD [facility] None    ------------------------------------------------------------------------------------------------------------------   HPI BP 124/70   Pulse 87   Temp 97.7 F (36.5 C)   Ht '5\' 5"'$  (1.651 m)   Wt 109 lb 9.6 oz (49.7 kg)   SpO2 95%   BMI 18.24 kg/m   82 y.o.female presents for dizziness, headache, runny nose, abdominal pain, joint pain vomiting and low grade fever x several days. She did have stelara injections last week.  It was her first injection  She is having some more difficulty swallowing and hoarseness.  She does follow with Dr. Denton Lank for thyroid and has an upcoming appointment. Had U/S of thyroid 12/2021 that was negative Past Medical History:  Diagnosis Date   Arthritis    Benign labile hypertension    Cancer  (HCC)    skin cancer on face   Colitis    DJD (degenerative joint disease)    Endometrial polyp    GERD (gastroesophageal reflux disease)    History of basal cell carcinoma excision    History of esophagitis    HOH (hard of hearing)    right ear better than left  -- wears no aides   Hyperlipidemia    Hypothyroidism    Neuromuscular disorder ([place])    neuropathy in feet   Osteopenia    Prediabetes    S/P right hip fracture 08/17/2020   Vitamin D deficiency    Wears glasses      Allergies  Allergen Reactions   Remicade [Infliximab]    Fosamax [Alendronate Sodium] Other (See Comments)    GI upset    Singulair [Montelukast Sodium] Other (See Comments)    Makes pt jittery   Wellbutrin [Bupropion] Hives   Clindamycin/Lincomycin Rash   Sulfa Antibiotics Rash    Current Outpatient Medications on File Prior to Visit  Medication Sig   acetaminophen (TYLENOL) 500 MG tablet Take 500 mg by mouth at bedtime as needed for moderate pain or headache.    Ascorbic Acid (VITAMIN C) 1000 MG tablet Take 1,000 mg by mouth daily.    aspirin EC 81 MG tablet Take 1 tablet (81 mg total) by mouth in the morning and at bedtime. (Patient taking differently: Take 81 mg by mouth daily.)   bisoprolol (ZEBETA) 5 MG tablet Take 1 tablet every Morning  for BP   Calcium Carbonate-Vit D-Min (CALCIUM 1200 PO) Take 1,200 mg by mouth daily.   CINNAMON PO Take 2,000 mg by mouth daily.   Coenzyme Q10 (COQ10) 200 MG CAPS Take 200 mg by mouth daily.   colestipol (COLESTID) 1 g tablet Take 4 g by mouth daily.   cycloSPORINE (RESTASIS) 0.05 % ophthalmic emulsion Place 1 drop into both eyes 2 (two) times daily.   DULoxetine (CYMBALTA) 30 MG capsule Take 1 capsule (30 mg total) by mouth 2 (two) times daily. (Patient not taking: Reported on 01/21/2023)   levothyroxine (SYNTHROID) 125 MCG tablet TAKE 1 TABLET BY MOUTH DAILY ON AN EMPTY STOMACH WITH ONLY WATER FOR 30 MINUTES(NO ANTACIDS MEDICATIONS, CALCIUM OR MAGNESIUM FOR 4  HOURS) (Patient taking differently: Take 125 mcg by mouth daily before breakfast.)   loperamide (IMODIUM) 2 MG capsule Take 4 mg by mouth 2 (two) times daily.   loratadine (CLARITIN) 10 MG tablet Take 10 mg by mouth daily as needed for allergies.   Mesalamine (ASACOL PO) Take by mouth. Take 2 tablets once daily   Mesalamine 800 MG TBEC Takes 1.2 grams Daily   Multiple Vitamin (THERA VITAMIN PO) Take 1 tablet by mouth daily.   Omega-3 Fatty Acids (FISH OIL) 1200 MG CAPS Take 1,200 mg by mouth daily.    pantoprazole (PROTONIX) 40 MG tablet Take 1 tablet  2 x /day  for Heartburn & Indigestion (Patient taking differently: Take 40 mg by mouth 2 (two) times daily.)   predniSONE (DELTASONE) 10 MG tablet Takes 1 tablet 2 x /day   Probiotic Product (PROBIOTIC PO) Take 1 capsule by mouth daily.   PROLIA 60 MG/ML SOSY injection Inject 60 mg into the skin every 6 (six) months.   pseudoephedrine (SUDAFED) 120 MG 12 hr tablet Take  1 tablet  2 x /day (every 12 hours)  for Head and Chest Congestion (Patient taking differently: Take 120 mg by mouth daily as needed for congestion (Head and chest congestion).)   simvastatin (ZOCOR) 20 MG tablet Take  1 tablet  at Bedtime  for Cholesterol                                                 /                        TAKE             BY MOUTH (Patient taking differently: Take 20 mg by mouth daily at 6 PM.)   vancomycin (VANCOCIN) 125 MG capsule Takes 1 capsule 3 x / day   No current facility-administered medications on file prior to visit.    ROS: all negative except above.   Physical Exam:  BP 124/70   Pulse 87   Temp 97.7 F (36.5 C)   Ht '5\' 5"'$  (1.651 m)   Wt 109 lb 9.6 oz (49.7 kg)   SpO2 95%   BMI 18.24 kg/m   General Appearance: Well nourished, in no apparent distress. Eyes: PERRLA, EOMs, conjunctiva no swelling or erythema Sinuses: No Frontal/maxillary tenderness ENT/Mouth: Ext aud canals clear, TMs without erythema, bulging. No erythema, swelling, or  exudate on post pharynx. Hearing normal.  Neck: Supple, thyroid normal.  Respiratory: Respiratory effort normal, BS equal bilaterally without rales, rhonchi, wheezing or stridor.  Cardio: RRR with no MRGs.  Brisk peripheral pulses without edema.  Abdomen: Soft, + BS.  Non tender, no guarding, rebound, hernias, masses. Lymphatics: Non tender without lymphadenopathy.  Musculoskeletal: Full ROM, 5/5 strength, normal gait.  Skin: Warm, dry without rashes, lesions, ecchymosis.  Neuro: Cranial nerves intact. Normal muscle tone, no cerebellar symptoms. Sensation intact.  Psych: Awake and oriented X 3, normal affect, Insight and Judgment appropriate.     Alycia Rossetti, NP 3:30 PM [facility]

## 2023-01-21 NOTE — Patient Instructions (Signed)

## 2023-01-23 NOTE — Unmapped (Signed)
[facility] Specialty Medication Onboarding    Specialty Medication: STELARA 90 mg/mL Syrg syringe (ustekinumab)  Prior Authorization: Approved   Financial Assistance: No - MAPs has reached maximum number of attempts to obtain necessary information from the patient in regards to the financial assistance process  Final Copay/Day Supply: $3071.27 / 56 DAYS     Insurance Restrictions: None     Notes to Pharmacist:     The triage team has completed the benefits investigation and has determined that the patient is able to fill this medication at [facility]. Please contact the patient to complete the onboarding or follow up with the prescribing physician as needed.

## 2023-01-24 NOTE — Unmapped (Unsigned)
Specialty Medication(s): Stelara    Ms.Hamblin has been dis-enrolled from the [facility] specialty pharmacy services due to enrollment in a manufacturer assistance program that sends medicine directly to the patient.    Additional information provided to the patient: [facility] [phone]    Traci Bennett, PharmD  [facility] Specialty Pharmacist TWICE DAILY 240 tablet 8    cycloSPORINE (RESTASIS) 0.05 % ophthalmic emulsion Administer 1 drop to both eyes every twelve (12) hours.      denosumab (PROLIA SUBQ) Inject under the skin every six (6) months.      fluticasone propionate (FLONASE) 50 mcg/actuation nasal spray 2 sprays into each nostril.      Lactobacillus acidophilus 10 billion cell cap Take 1 capsule by mouth daily.      levothyroxine (SYNTHROID) 125 MCG tablet Take 1 tablet (125 mcg total) by mouth daily before breakfast.      loperamide (IMODIUM A-D) 2 mg tablet Take 1 tablet (2 mg total) by mouth four (4) times a day as needed for diarrhea. 120 tablet 3    loratadine (CLARITIN) 10 mg tablet Take 1 tablet (10 mg total) by mouth daily.      mesalamine (LIALDA) 1.2 gram DR tablet Take 2 tablets (2.4 g total) by mouth in the morning. 60 tablet 8    multivitamin therapeutic with minerals (THERA-M) 27-0.4 mg Tab Take 1 tablet by mouth daily.      omega-3 fatty acids-fish oil 360-1,200 mg cap Take 1,200 mg by mouth.      pantoprazole (PROTONIX) 40 MG tablet 40 mg PO BID prior to breakfast and prior to dinner 180 tablet 3    pseudoephedrine (SUDAFED) 120 mg 12 hr tablet Take  1 tablet  2 x /day (every 12 hours)  for Head and Chest Congestion      simvastatin (ZOCOR) 20 MG tablet Take 1 tablet (20 mg total) by mouth daily before breakfast.      ustekinumab (STELARA) 90 mg/mL Syrg syringe Inject the contents of 1 syringe (90 mg) under the skin every 8 weeks. 1 mL 2    vancomycin (VANCOCIN) 125 MG capsule Take 1 capsule (125 mg total) by mouth Three (3) times a day. 90 capsule 0     No current facility-administered medications for this visit.       Allergies   Allergen Reactions    Bupropion Hives and Other (See Comments)    Montelukast Sodium Other (See Comments)     Reaction:  Makes pt jittery  Makes pt jittery      Alendronate Sodium Other (See Comments) and Rash     Reaction:  GI upset   GI upset       Clindamycin Rash    Sulfa (Sulfonamide Antibiotics) Rash       Patient Active Problem List   Diagnosis    Pancolitis (CMS-HCC)       Reviewed and up to date in Epic.    Appropriateness of Therapy     Acute infections noted within Epic:  Rule Out C. Diff  Patient reported infection: {Blank single:19197::None,***- patient reported to provider,***- pharmacy reported to provider}    Is medication and dose appropriate based on diagnosis and infection status? {Blank single:19197::Yes,No - evidence provided by prescriber in *** note}    Prescription has been clinically reviewed: {Blank single:19197::Yes,***}      Baseline Quality of Life Assessment      {DiseaseSpecificQOL:73897}    Financial Information  Medication Assistance provided: {sscmedassist:65303}    Anticipated copay of $*** reviewed with patient. Verified delivery address.    Delivery Information     Scheduled delivery date: ***    Expected start date: ***    Patient was notified of new phone menu: {Blank:19197::Yes,No}    Medication will be delivered via {Blank:19197::UPS,Next Day Courier,Same Day Courier,Clinic Courier - *** clinic,***} to the {Blank:19197::prescription,temporary} address in Epic WAM.  This shipment {Blank single:19197::will,will not} require a signature.      Explained the services we provide at [facility] and that each month we would call to set up refills.  Stressed importance of returning phone calls so that we could ensure they receive their medications in time each month.  Informed patient that we should be setting up refills 7-10 days prior to when they will run out of medication.  A pharmacist will reach out to perform a clinical assessment periodically.  Informed patient that a welcome packet, containing information about our pharmacy and other support services, a Notice of Privacy Practices, and a drug information handout will be sent.      The patient or caregiver noted above participated in the development of this care plan and knows that they can request review of or adjustments to the care plan at any time.      Patient or caregiver verbalized understanding of the above information as well as how to contact the pharmacy at [phone] option 4 with any questions/concerns.  The pharmacy is open Monday through Friday 8:30am-4:30pm.  A pharmacist is available 24/7 via pager to answer any clinical questions they may have.    Patient Specific Needs     Does the patient have any physical, cognitive, or cultural barriers? {Blank single:19197::No,Yes - ***}    Does the patient have adequate living arrangements? (i.e. the ability to store and take their medication appropriately) {Blank single:19197::Yes,No - ***}    Did you identify any home environmental safety or security hazards? {Blank single:19197::No,Yes - ***}    Patient prefers to have medications discussed with  {Blank single:19197::Patient,Family Member,Caregiver,Other}     Is the patient or caregiver able to read and understand education materials at a high school level or above? {Blank single:19197::No,Yes}    Patient's primary language is  {Blank single:19197::English,Spanish,***}     Is the patient high risk? {sschighriskpts:78327}    SOCIAL DETERMINANTS OF HEALTH     At the [facility], we have learned that life circumstances - like trouble affording food, housing, utilities, or transportation can affect the health of many of our patients.   That is why we wanted to ask: are you currently experiencing any life circumstances that are negatively impacting your health and/or quality of life? {YES/NO/PATIENTDECLINED:93004}    Social Determinants of Health     Financial Resource Strain: Not on file   Internet Connectivity: Not on file   Food Insecurity: No Food Insecurity (12/13/2021)    Received from [facility]    Hunger Vital Sign     Worried About Running Out of Food in the Last Year: Never true     Ran Out of Food in the Last Year: Never true   Tobacco Use: Medium Risk (01/15/2023)    Patient History     Smoking Tobacco Use: Former     Smokeless Tobacco Use: Never     Passive Exposure: Not on file   Housing/Utilities: Not on file   Alcohol Use: Not on file   Transportation Needs:  No Transportation Needs (12/13/2021)    Received from [facility] - Transportation     Lack of Transportation (Medical): No     Lack of Transportation (Non-Medical): No   Substance Use: Not on file   Health Literacy: Not on file   Physical Activity: Not on file   Interpersonal Safety: Not on file   Stress: Not on file   Intimate Partner Violence: Not At Risk (04/27/2021)    Received from [facility]    Humiliation, Afraid, Rape, and Kick questionnaire     Fear of Current or Ex-Partner: No     Emotionally Abused: No     Physically Abused: No     Sexually Abused: No   Depression: Not at risk (02/26/2022)    Received from [facility]    PHQ-2     Patient Health Questionnaire-2 Score: 0   Social Connections: Not on file       Would you be willing to receive help with any of the needs that you have identified today? {Yes/No/Not applicable:93005}       Traci Bennett, PharmD  [facility] Specialty Pharmacist

## 2023-01-25 ENCOUNTER — Telehealth: Payer: Self-pay

## 2023-01-25 NOTE — Progress Notes (Signed)
Chart Prep for focused outreach started, completed and uploaded to [employer] for CPP review. Reviewed office visits, specialists visits, hospital visits, medications, adherence, and labs. Left detailed voicemail to return call.    Time Spent: 15 min.   Hildred Alamin, CTL

## 2023-01-28 NOTE — Unmapped (Signed)
Patient with worsening uncontolled diarrhea, with microscopic colitis that has now evolved, based on most recent colonoscopy in 2022, she has Crohn's colitis    Prior therapies having included:    Mesalamine  Prednisone - partial response  Vedolizumab - lack of response  Infliximab, initial response but severe infectious complications  Ustekinumab, lack of response (although only received infusion, but no response to this)    Recommendation:  As her last colonoscopy was in 2022, recommend restaging with colonoscopy  Will consider upadacitinib as next step  Low threshold for hospitalization should she become dehydrated            Traci Bennett D. Traci Deguire MD, MPH  Professor of Medicine  [place] [facility] [place] at [facility] of Gastroenterology and Hepatology

## 2023-01-29 ENCOUNTER — Ambulatory Visit: Payer: Self-pay | Admitting: Pharmacist

## 2023-01-29 ENCOUNTER — Encounter: Payer: Self-pay | Admitting: Internal Medicine

## 2023-01-29 DIAGNOSIS — K52839 Microscopic colitis, unspecified: Principal | ICD-10-CM

## 2023-01-29 DIAGNOSIS — I10 Essential (primary) hypertension: Secondary | ICD-10-CM

## 2023-01-29 DIAGNOSIS — K5100 Ulcerative (chronic) pancolitis without complications: Secondary | ICD-10-CM

## 2023-01-29 DIAGNOSIS — Z79899 Other long term (current) drug therapy: Secondary | ICD-10-CM

## 2023-01-29 MED ORDER — COLESTIPOL 1 GRAM TABLET
ORAL_TABLET | Freq: Three times a day (TID) | ORAL | 0 refills | 0 days
Start: 2023-01-29 — End: ?

## 2023-01-29 NOTE — Progress Notes (Signed)
Focused Pharmacist Outreach     1. BP/HR: stable per pt, no readings over 150/90  2. Collitis- recent flares, following closely with GI. Started Stelara and received 1st infusion- then in 2 weeks will start follow up injections. Has not seen benefit so far, but is willing to be patient. Has scheduled colonoscopy for next month to assess colitis staging.   3. No other medication changes, denies any falls or injuries. Denies any concerns with dehydration at this time. Will follow up with pt in 2 months after colonoscopy and injections on colitis staging and progress.     CPP Telemedicine time: 10 min

## 2023-01-29 NOTE — Unmapped (Signed)
Colonoscopy  Procedure #1     Procedure #2   [id number]  MRN   long to do   Endoscopist     Is the patient's health insurance [address], [facility]), or [employer]?     Urgent procedure     Are you pregnant?     Are you in the process of scheduling or awaiting results of a heart ultrasound, stress test, or catheterization to evaluate new or worsening chest pain, dizziness, or shortness of breath?     Do you take: Plavix (clopidogrel), Coumadin (warfarin), Lovenox (enoxaparin), Pradaxa (dabigatran), Effient (prasugrel), Xarelto (rivaroxaban), Eliquis (apixaban), Pletal (cilostazol), or Brilinta (ticagrelor)?          Which of the above medications are you taking?          What is the name of the medical practice that manages this medication?          What is the name of the medical provider who manages this medication?     Do you have hemophilia, von Willebrand disease, or low platelets?     Do you have a pacemaker or implanted cardiac defibrillator?     Has a [place] [facility] provider specified the location(s)?     Which location(s) did the [facility] provider specify?        [facility]        [place]        [facility]        [facility]Mod Sed[facility]     Is procedure indication for variceal banding (this does NOT include variceal screening)?     Have you had a heart attack, stroke or heart stent placement within the past 6 months?     Month of event     Year of event (ONLY ENTER LAST 2 DIGITS)        5  Height (feet)   5  Height (inches)   110  Weight (pounds)   18.3  BMI          Did the ordering provider specify a bowel prep?          What bowel prep was specified?     Do you have chronic kidney disease?     Do you have chronic constipation or have you had poor quality bowel preps for past colonoscopies?   TRUE  Do you have Crohn's disease or ulcerative colitis?     Have you had weight loss surgery?          When you walk around your house or grocery store, do you have to stop and rest due to shortness of breath, chest pain, or light-headedness?     Do you ever use supplemental oxygen?     Have you been hospitalized for cirrhosis of the liver or heart failure in the last 12 months?     Have you been treated for mouth or throat cancer with radiation or surgery?     Have you been told that it is difficult for doctors to insert a breathing tube in you during anesthesia?     Have you had a heart or lung transplant?          Are you on dialysis?     Do you have cirrhosis of the liver?     Do you have myasthenia gravis?     Is the patient a prisoner?          Have you been diagnosed with sleep apnea or do  you wear a CPAP machine at night?     Are you younger than 30?     Have you previously received propofol sedation administered by an anesthesiologist for a GI procedure?     Do you drink an average of more than 3 drinks of alcohol per day?     Do you regularly take suboxone or any prescription medications for chronic pain?     Do you regularly take Ativan, Klonopin, Xanax, Valium, lorazepam, clonazepam, alprazolam, or diazepam?     Have you previously had difficulty with sedation during a GI procedure?     Have you been diagnosed with PTSD?     Are you allergic to fentanyl or midazolam (Versed)?     Do you take medications for HIV?   ################# ## ###################################################################################################################   MRN:          [id number]   Anticoag Review:  No   Nurse Triage:  No   GI Clinic Consult:  No   Procedure(s):  Colonoscopy     0   [facility]  [facility]     [facility]     [place]     [facility]-Mod Sed   Endoscopist:  long to do    Urgent:            No   Prep:               Miralax Prep                  ################# ## ###################################################################################################################

## 2023-01-30 MED ORDER — COLESTIPOL 1 GRAM TABLET
0 refills | 0 days
Start: 2023-01-30 — End: ?

## 2023-02-01 NOTE — Unmapped (Signed)
Left message on 02/01/23 for Traci Bennett regarding upcoming GI procedure on 02/06/23 at [facility] location at 0900 with arrival time of 0800. Detailed start date of low fiber diet and clear liquid diet/prep, medications to hold or adjust as well as needing to have driver. Reinforced that failure to follow the diet and prep instructions could result in procedure getting rescheduled. Left number [phone] for return call if there are any questions after reviewing instructions.

## 2023-02-04 DIAGNOSIS — E559 Vitamin D deficiency, unspecified: Secondary | ICD-10-CM | POA: Diagnosis not present

## 2023-02-04 DIAGNOSIS — M810 Age-related osteoporosis without current pathological fracture: Secondary | ICD-10-CM | POA: Diagnosis not present

## 2023-02-04 DIAGNOSIS — R5383 Other fatigue: Secondary | ICD-10-CM | POA: Diagnosis not present

## 2023-02-04 DIAGNOSIS — Z780 Asymptomatic menopausal state: Secondary | ICD-10-CM | POA: Diagnosis not present

## 2023-02-04 DIAGNOSIS — M85852 Other specified disorders of bone density and structure, left thigh: Secondary | ICD-10-CM | POA: Diagnosis not present

## 2023-02-06 ENCOUNTER — Encounter: Admit: 2023-02-06 | Discharge: 2023-02-06 | Payer: MEDICARE | Attending: Specialist | Primary: Specialist

## 2023-02-06 ENCOUNTER — Ambulatory Visit: Admit: 2023-02-06 | Discharge: 2023-02-06 | Payer: MEDICARE

## 2023-02-06 DIAGNOSIS — Z882 Allergy status to sulfonamides status: Secondary | ICD-10-CM | POA: Diagnosis not present

## 2023-02-06 DIAGNOSIS — E7800 Pure hypercholesterolemia, unspecified: Secondary | ICD-10-CM | POA: Diagnosis not present

## 2023-02-06 DIAGNOSIS — K529 Noninfective gastroenteritis and colitis, unspecified: Secondary | ICD-10-CM | POA: Diagnosis not present

## 2023-02-06 DIAGNOSIS — D649 Anemia, unspecified: Secondary | ICD-10-CM | POA: Diagnosis not present

## 2023-02-06 DIAGNOSIS — K648 Other hemorrhoids: Secondary | ICD-10-CM | POA: Diagnosis not present

## 2023-02-06 DIAGNOSIS — Z79890 Hormone replacement therapy (postmenopausal): Secondary | ICD-10-CM | POA: Diagnosis not present

## 2023-02-06 DIAGNOSIS — I10 Essential (primary) hypertension: Secondary | ICD-10-CM | POA: Diagnosis not present

## 2023-02-06 DIAGNOSIS — K219 Gastro-esophageal reflux disease without esophagitis: Secondary | ICD-10-CM | POA: Diagnosis not present

## 2023-02-06 DIAGNOSIS — H9190 Unspecified hearing loss, unspecified ear: Secondary | ICD-10-CM | POA: Diagnosis not present

## 2023-02-06 DIAGNOSIS — Z79899 Other long term (current) drug therapy: Secondary | ICD-10-CM | POA: Diagnosis not present

## 2023-02-06 DIAGNOSIS — E039 Hypothyroidism, unspecified: Secondary | ICD-10-CM | POA: Diagnosis not present

## 2023-02-06 DIAGNOSIS — Z87891 Personal history of nicotine dependence: Secondary | ICD-10-CM | POA: Diagnosis not present

## 2023-02-06 MED ADMIN — lactated Ringers infusion: 10 mL/h | INTRAVENOUS | @ 13:00:00 | Stop: 2023-02-06

## 2023-02-06 MED ADMIN — Propofol (DIPRIVAN) injection: INTRAVENOUS | @ 13:00:00 | Stop: 2023-02-06

## 2023-02-06 MED ADMIN — propofol (DIPRIVAN) infusion 10 mg/mL: INTRAVENOUS | @ 13:00:00 | Stop: 2023-02-06

## 2023-02-07 DIAGNOSIS — A0472 Enterocolitis due to Clostridium difficile, not specified as recurrent: Principal | ICD-10-CM

## 2023-02-07 DIAGNOSIS — K5100 Ulcerative (chronic) pancolitis without complications: Principal | ICD-10-CM

## 2023-02-07 MED ORDER — FIDAXOMICIN 200 MG TABLET
ORAL_TABLET | Freq: Two times a day (BID) | ORAL | 0 refills | 10 days | Status: CP
Start: 2023-02-07 — End: ?

## 2023-02-07 MED ORDER — VANCOMYCIN 125 MG CAPSULE
ORAL_CAPSULE | Freq: Four times a day (QID) | ORAL | 0 refills | 30 days | Status: CP
Start: 2023-02-07 — End: ?

## 2023-02-07 NOTE — Unmapped (Signed)
Patient is c diff positive, this is a recurrence for her. Recent colonoscopy showed improvement in prior inflammation. Recommendations:  Continue stelara for colitis  For right now, hold colestid  Plan to start fidaxomycin 2 x daily for 10 days or vanco 125 mg QID x 1 month, whichever is covered better by insurance, data show that longer course of vanco are needed with IBD  Update me on symptoms in 1-2 weeks  May consider bezlotoxumab towards the end of her treatment period.            Deandria Klute D. Coley Kulikowski MD, MPH  Professor of Medicine  [place] [facility] [place] at [facility] of Gastroenterology and Hepatology

## 2023-02-08 DIAGNOSIS — K529 Noninfective gastroenteritis and colitis, unspecified: Secondary | ICD-10-CM | POA: Diagnosis not present

## 2023-02-11 NOTE — Unmapped (Addendum)
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 Loreli Dollar.    Interval History:  Patient reports doing mildly better after starting vancomycin therapy for her C.diff infection. She is on day 5 of therapy for C.diff (started 3/22). She reports no issues with the vancomycin and no missed doses. She continues to take her Lialda and finished her prednisone course last week. She reports no NSAID use besides a baby aspirin before bed. She reports using Imodium, few doses a day, for her diarrhea related to C.diff. She received her Stelara induction infusion on 2/27 and is due for her first maintenance injection on 4/22. Of note, patient's copay is ~$3000 per injection. She has not filled out the paperwork for manufacturer assistance yet. Patient's sister, [name] [phone]), will be administering the Stelara injections for the patient. I spoke with Darel Hong and she has watched videos of how to administer the injections and is a retired Facilities manager.      Evaluation of IBD Biologic Therapy: ustekinumab  Time on Current Therapy: Day 28  (started 2/27)     - Last Dose: 2/27     - Adherence: start of subcutaneous injections on 4/22     - Symptoms:  # of BM/day: 6-7   Stool Consistency: Loose, some watery   Stool Urgency: Almost every BM   Nocturnal BM: none   Blood in Stool: none   Abdominal Pain: none   Nausea/Vomiting: none   Any Sickness: Ongoing C.diff infection   NSAID use: Baby aspirin at night   Antidiarrheal use: Imodium, few doses a day      - Adverse Effects: None    Other IBD Therapies:  [x]  5-ASA: mesalamine 1.2 g daily  []  Immunomodulators  []  Corticosteroids       MEDICATIONS/ALLERGIES:     Current Outpatient Medications   Medication Instructions    acetaminophen (TYLENOL) 500 mg, Oral    ascorbic acid (vitamin C) (VITAMIN C) 1,000 mg, Oral    aspirin (ECOTRIN) 81 mg, Oral, Daily (standard)    bisoprolol (ZEBETA) 10 mg, Oral, Daily (standard)    bisoproloL-hydrochlorothiazide (ZIAC) 2.5-6.25 mg per tablet 1 tablet, Oral, Daily    calcium carbonate-vit D3-min 600 mg calcium- 400 unit Tab Oral    cetirizine (ZYRTEC) 10 mg, Oral    cholecalciferol (vitamin D3-50 mcg (2,000 unit)) 2,000 Units, Oral, Daily (standard)    cinnamon bark 500 mg, Oral    coenzyme Q10 200 mg, Oral, Daily (standard)    colestipol (COLESTID) 4 g, Oral, 3 times a day (standard)    cycloSPORINE (RESTASIS) 0.05 % ophthalmic emulsion 1 drop, Both Eyes, Every 12 hours    denosumab (PROLIA SUBQ) Subcutaneous, Every 6 months    fidaxomicin (DIFICID) 200 mg, Oral, 2 times a day (standard)    fluticasone propionate (FLONASE) 50 mcg/actuation nasal spray 2 sprays, Nasal    Lactobacillus acidophilus 10 billion cell cap 1 capsule, Oral, Daily (standard)    levothyroxine (SYNTHROID) 125 mcg, Oral, Daily before breakfast    loperamide (IMODIUM A-D) 2 mg, Oral, 4 times a day PRN    loratadine (CLARITIN) 10 mg tablet 1 tablet, Oral, Daily (standard)    mesalamine (LIALDA) 2.4 g, Oral, Daily    multivitamin therapeutic with minerals (THERA-M) 27-0.4 mg Tab 1 tablet, Oral,  Daily (standard)    omega-3 fatty acids-fish oil 360-1,200 mg cap 1,200 mg, Oral    pantoprazole (PROTONIX) 40 MG tablet 40 mg PO BID prior to breakfast and prior to dinner    pseudoephedrine (SUDAFED) 120 mg 12 hr tablet Take  1 tablet  2 x /day (every 12 hours)  for Head and Chest Congestion    simvastatin (ZOCOR) 20 mg, Oral, Daily before breakfast    ustekinumab (STELARA) 90 mg/mL Syrg syringe Inject the contents of 1 syringe (90 mg) under the skin every 8 weeks.    vancomycin (VANCOCIN) 125 mg, Oral, 4 times a day       Allergies   Allergen Reactions    Bupropion Hives and Other (See Comments)    Montelukast Sodium Other (See Comments)     Reaction:  Makes pt jittery  Makes pt jittery Alendronate Sodium Other (See Comments) and Rash     Reaction:  GI upset   GI upset       Clindamycin Rash    Sulfa (Sulfonamide Antibiotics) Rash         IBD HISTORY:     Year of disease onset:  years ago  Diagnosis:  Indeterminate Colitis  Age at onset:   28-40 yr old (A2)  Location:  Unknown  Behavior:  Colitis  Perianal Dz:  No    Brief IBD Disease Course:    Diagnosed years' ago with microscopic colitis, chronic entocort use with suboptimal control of diarrhea, osteopenia and fracture after a fall. Trial of entyvio wtihout clinical response. Colonoscopy with chronic colitis changes, thickening on CT, and elevated fecal calpro (387). Diagnosis changed to indeterminate colitis, initiation of remicade monotherapy     Endoscopy:      Colonoscopy 2022: Nodular mucosa in the sigmoid colon, in the descending colon, in the ascending colon and in the cecum.    Imaging:    CT AP W Contrast 06/23/22:   1. No acute intra-abdominal or intrapelvic process.     Extraintestinal Manifestations:   []  Joint Pains:  []  Ocular:  []  Skin:  []  Oral ulcers:  []  Thrombosis:  []  PSC:  []  Other:    Prior IBD Therapy:  [x]  5-ASAs - mesalamine  [x]  Oral corticosteroids - budesonide, prednisone  []  Intravenous corticosteroids  []  Antibiotics  []  Thiopurines   []  Methotrexate  [x]  Anti-TNF therapies - infliximab (increased infections)  [x]  Anti-Integrin therapies - vedolizumab (lack of response)  []  Anti-Interleukin therapies  []  Anti-JAK therapies  []  Cyclosporine  []  Clinical trial medication  []  Other (Please specify):    IBD Health Maintenance    Vaccine Date   Influenza 08/13/2022   Pneumococcal --   COVID-19 08/13/2022   Zoster 01/01/2018   Hepatitis B --       [x]  Iron Deficiency  Lab Results   Component Value Date    FERRITIN 27.0 08/10/2022    LABIRON 25 03/29/2022    HGB 11.5 08/10/2022    HGB 11.5 08/10/2022       []  Vitamin D Deficiency  Lab Results   Component Value Date    VITDTOTAL 57.6 07/26/2020          RELEVANT LABS, DATA, INDICES:     Lab Results   Component Value Date    HBSAG Negative 03/29/2022    QFTTBGOLD Negative 07/26/2020       Lab Results   Component Value Date    WBC 4.4 08/10/2022    WBC 4.4 08/10/2022  HGB 11.5 08/10/2022    HGB 11.5 08/10/2022    HCT 35.2 08/10/2022    HCT 35.2 08/10/2022    PLT 237 08/10/2022    PLT 237 08/10/2022       Lab Results   Component Value Date    NA 137 06/18/2022    K 4.4 06/18/2022    CL 102 06/18/2022    CO2 23 06/18/2022    BUN 12 06/18/2022    CREATININE 0.87 06/18/2022    GLU 88 05/11/2021    CALCIUM 9.8 06/18/2022       Lab Results   Component Value Date    BILITOT 0.4 06/18/2022    BILIDIR 0.12 06/18/2022    PROT 6.1 06/18/2022    ALBUMIN 4.9 05/11/2021    ALT 15 08/10/2022    AST 22 08/10/2022    ALKPHOS 78 08/10/2022       Lab Results   Component Value Date    CRP 1 06/18/2022    STOOL CALPROTECTIN 114 06/07/2022         ASSESSMENT & RECOMMENDATIONS:     Indeterminate Colitis  Recent colonoscopy shows improvement in inflammation. Unable to determine full effects of ustekinumab therapy due to ongoing C.diff infection  - Continue therapy with ustekinumab; first maintenance injection due 4/22  - Continue mesalamine 1.2 grams daily    Clostridium difficile infection  Slight improvement on vancomycin however only 5 days into therapy  - Continue vancomycin 125 mg QID x 28 days  - Hold loperamide and colestipol for now given ongoing infection    Will follow up in 4 weeks    The patient reports they are physically located in [place] and is currently: at home. I conducted a phone visit.  I spent 23 minutes on the phone call with the patient on the date of service .     --------------------------------------------    Edsel Petrin, PharmD, BCPS, CPP  Clinical Pharmacist Practitioner - GI/IBD  [facility]

## 2023-02-12 ENCOUNTER — Institutional Professional Consult (permissible substitution): Admit: 2023-02-12 | Discharge: 2023-02-13 | Payer: MEDICARE

## 2023-02-12 DIAGNOSIS — Z7982 Long term (current) use of aspirin: Secondary | ICD-10-CM | POA: Diagnosis not present

## 2023-02-12 DIAGNOSIS — Z882 Allergy status to sulfonamides status: Secondary | ICD-10-CM | POA: Diagnosis not present

## 2023-02-12 DIAGNOSIS — Z888 Allergy status to other drugs, medicaments and biological substances status: Secondary | ICD-10-CM | POA: Diagnosis not present

## 2023-02-12 DIAGNOSIS — K5100 Ulcerative (chronic) pancolitis without complications: Secondary | ICD-10-CM | POA: Diagnosis not present

## 2023-02-12 DIAGNOSIS — Z881 Allergy status to other antibiotic agents status: Secondary | ICD-10-CM | POA: Diagnosis not present

## 2023-02-12 NOTE — Unmapped (Signed)
It was a pleasure seeing you today! The following was discussed at today's visit:  Continue vancomycin four times a day for your C.diff infection  Stop Imodium   Return Stelara Manufacturer Assistance paperwork to [place] to help with copay assistance    Please reach out if you have any questions or concerns. Thank you!    Edsel Petrin, PharmD, BCPS, CPP  Clinical Pharmacist Practitioner, GI/IBD  [phone]

## 2023-02-14 ENCOUNTER — Encounter: Payer: Self-pay | Admitting: Internal Medicine

## 2023-02-14 ENCOUNTER — Ambulatory Visit (INDEPENDENT_AMBULATORY_CARE_PROVIDER_SITE_OTHER): Payer: Medicare Other | Admitting: Nurse Practitioner

## 2023-02-14 ENCOUNTER — Encounter: Payer: Self-pay | Admitting: Nurse Practitioner

## 2023-02-14 ENCOUNTER — Other Ambulatory Visit: Payer: Self-pay

## 2023-02-14 VITALS — BP 130/90 | HR 78 | Temp 98.9°F | Ht 65.0 in | Wt 112.2 lb

## 2023-02-14 DIAGNOSIS — R197 Diarrhea, unspecified: Secondary | ICD-10-CM

## 2023-02-14 DIAGNOSIS — A0472 Enterocolitis due to Clostridium difficile, not specified as recurrent: Secondary | ICD-10-CM

## 2023-02-14 DIAGNOSIS — Z1152 Encounter for screening for COVID-19: Secondary | ICD-10-CM

## 2023-02-14 DIAGNOSIS — R6889 Other general symptoms and signs: Secondary | ICD-10-CM

## 2023-02-14 LAB — POC COVID19 BINAXNOW: SARS Coronavirus 2 Ag: NEGATIVE

## 2023-02-14 LAB — POCT INFLUENZA A/B
Influenza A, POC: NEGATIVE
Influenza B, POC: NEGATIVE

## 2023-02-14 NOTE — Patient Instructions (Signed)
Dehydration, Adult Dehydration is a condition in which there is not enough water or other fluids in the body. This happens when a person loses more fluids than they take in. Important organs cannot work right without the right amount of fluids. Any loss of fluids from the body can cause dehydration. Dehydration can be mild, worse, or very bad. It should be treated right away to keep it from getting very bad. What are the causes? Conditions that cause loss of water in the body. They include: Watery poop (diarrhea). Vomiting. Sweating a lot. Fever. Infection. Peeing (urinating) a lot. Not drinking enough fluids. Certain medicines, such as medicines that take extra fluid out of the body (diuretics). Lack of safe drinking water. Not being able to get enough water and food. What increases the risk? Having a long-term (chronic) illness that has not been treated the right way, such as: Diabetes. Heart disease. Kidney disease. Being 65 years of age or older. Having a disability. Living in a place that is high above the ground or sea (high in altitude). The thinner, drier air causes more fluid loss. Doing exercises that put stress on your body for a long time. Being active when in hot places. What are the signs or symptoms? Symptoms of dehydration depend on how bad it is. Mild or worse dehydration Thirst. Dry lips or dry mouth. Feeling dizzy or light-headed. Muscle cramps. Passing little pee or dark pee. Pee may be the color of tea. Headache. Very bad dehydration Changes in skin. Skin may: Be cold to the touch (clammy). Be blotchy or pale. Not go back to normal right after you pinch it and let it go. Little or no tears, pee, or sweat. Fast breathing. Low blood pressure. Weak pulse. Pulse that is more than 100 beats a minute when you are sitting still. Other changes, such as: Feeling very thirsty. Eyes that look hollow (sunken). Cold hands and feet. Being confused. Being very  tired (lethargic) or having trouble waking from sleep. Losing weight. Loss of consciousness. How is this treated? Treatment for this condition depends on how bad your dehydration is. Treatment should start right away. Do not wait until your condition gets very bad. Very bad dehydration is an emergency. You will need to go to a hospital. Mild or worse dehydration can be treated at home. You may be asked to: Drink more fluids. Drink an oral rehydration solution (ORS). This drink gives you the right amount of fluids, salts, and minerals (electrolytes). Very bad dehydration can be treated: With fluids through an IV tube. By correcting low levels of electrolytes in the body. By treating the problem that caused your dehydration. Follow these instructions at home: Oral rehydration solution If told by your doctor, drink an ORS: Make an ORS. Use instructions on the package. Start by drinking small amounts, about  cup (120 mL) every 5-10 minutes. Slowly drink more until you have had the amount that your doctor said to have.  Eating and drinking  Drink enough clear fluid to keep your pee pale yellow. If you were told to drink an ORS, finish the ORS first. Then, start slowly drinking other clear fluids. Drink fluids such as: Water. Do not drink only water. Doing that can make the salt (sodium) level in your body get too low. Water from ice chips you suck on. Fruit juice that you have added water to (diluted). Low-calorie sports drinks. Eat foods that have the right amounts of salts and minerals, such as bananas, oranges, potatoes,   tomatoes, or spinach. Do not drink alcohol. Avoid drinks that have caffeine or sugar. These include:: High-calorie sports drinks. Fruit juice that you did not add water to. Soda. Coffee or energy drinks. Avoid foods that are greasy or have a lot of fat or sugar. General instructions Take over-the-counter and prescription medicines only as told by your doctor. Do  not take sodium tablets. Doing that can make the salt level in your body get too high. Return to your normal activities as told by your doctor. Ask your doctor what activities are safe for you. Keep all follow-up visits. Your doctor may check and change your treatment. Contact a doctor if: You have pain in your belly (abdomen) and the pain: Gets worse. Stays in one place. You have a rash. You have a stiff neck. You get angry or annoyed more easily than normal. You are more tired or have a harder time waking than normal. You feel weak or dizzy. You feel very thirsty. Get help right away if: You have any symptoms of very bad dehydration. You vomit every time you eat or drink. Your vomiting gets worse, does not go away, or you vomit blood or green stuff. You are getting treatment, but symptoms are getting worse. You have a fever. You have a very bad headache. You have: Diarrhea that gets worse or does not go away. Blood in your poop (stool). This may cause poop to look black and tarry. No pee in 6-8 hours. Only a small amount of pee in 6-8 hours, and the pee is very dark. You have trouble breathing. These symptoms may be an emergency. Get help right away. Call 911. Do not wait to see if the symptoms will go away. Do not drive yourself to the hospital. This information is not intended to replace advice given to you by your health care provider. Make sure you discuss any questions you have with your health care provider. Document Revised: 06/04/2022 Document Reviewed: 06/04/2022 Elsevier Patient Education  2023 [employer] Inc.  

## 2023-02-14 NOTE — Progress Notes (Signed)
Assessment and Plan:  Debbie Houston was seen today for an episodic visit.  Diagnoses and all order for this visit:  Flu-like symptoms Negative  - POCT Influenza A/B  Encounter for screening for COVID-19 Negative  - POC COVID-19  Diarrhea of presumed infectious origin Stay well hydrated by drinking 2-3L fluid/water daily. Discussed possible IV hydration if any FVD.  - CBC with Differential/Platelet - COMPLETE METABOLIC PANEL WITH GFR - Magnesium  C. difficile diarrhea Confirmed by colonoscopy Continue Vancomycin   Notify office for further evaluation and treatment, questions or concerns if s/s fail to improve. The risks and benefits of my recommendations, as well as other treatment options were discussed with the patient today. Questions were answered.  Further disposition pending results of labs. Discussed med's effects and SE's.    Over 20 minutes of exam, counseling, chart review, and critical decision making was performed.   Future Appointments  Date Time Provider [place]  02/20/2023 11:30 AM [facility] NURSE [facility]  03/14/2023  1:40 PM Bo Merino, MD [facility] None  03/25/2023 11:00 AM Alycia Rossetti, NP [facility] None  04/22/2023  2:30 PM Narda Amber K, DO [facility] None  04/29/2023 10:30 AM Rex Kras, DO [facility] None  06/25/2023  2:00 PM Unk Pinto, MD [facility] None    ------------------------------------------------------------------------------------------------------------------   HPI BP (!) 130/90   Pulse 78   Temp 98.9 F (37.2 C)   Ht 5\' 5"  (1.651 m)   Wt 112 lb 3.2 oz (50.9 kg)   SpO2 96%   BMI 18.67 kg/m    Patient complains of symptoms of a URI. Symptoms include achiness, fatigue, chills, fatigue, diarrhea, nausea.  States that she had a colonoscopy on 02/06/23 and was found to have C Diff.  She started tmt with Vancomycin and is continuing to take as directed. She did have diarrhea prior to onset of other symptoms. Onset  of symptoms was 1 week ago, and has been unchanged since that time. Treatment to date: none. Denies fever, chills, vomiting.    Past Medical History:  Diagnosis Date   Arthritis    Benign labile hypertension    Cancer (HCC)    skin cancer on face   Colitis    DJD (degenerative joint disease)    Endometrial polyp    GERD (gastroesophageal reflux disease)    History of basal cell carcinoma excision    History of esophagitis    HOH (hard of hearing)    right ear better than left  -- wears no aides   Hyperlipidemia    Hypothyroidism    Neuromuscular disorder ([place])    neuropathy in feet   Osteopenia    Prediabetes    S/P right hip fracture 08/17/2020   Vitamin D deficiency    Wears glasses      Allergies  Allergen Reactions   Remicade [Infliximab]    Fosamax [Alendronate Sodium] Other (See Comments)    GI upset    Singulair [Montelukast Sodium] Other (See Comments)    Makes pt jittery   Wellbutrin [Bupropion] Hives   Clindamycin/Lincomycin Rash   Sulfa Antibiotics Rash    Current Outpatient Medications on File Prior to Visit  Medication Sig   acetaminophen (TYLENOL) 500 MG tablet Take 500 mg by mouth at bedtime as needed for moderate pain or headache.    Ascorbic Acid (VITAMIN C) 1000 MG tablet Take 1,000 mg by mouth daily.    aspirin EC 81 MG tablet Take 1 tablet (81 mg total) by  mouth in the morning and at bedtime. (Patient taking differently: Take 81 mg by mouth daily.)   bisoprolol (ZEBETA) 5 MG tablet Take 1 tablet every Morning for BP   Calcium Carbonate-Vit D-Min (CALCIUM 1200 PO) Take 1,200 mg by mouth daily.   CINNAMON PO Take 2,000 mg by mouth daily.   Coenzyme Q10 (COQ10) 200 MG CAPS Take 200 mg by mouth daily.   cycloSPORINE (RESTASIS) 0.05 % ophthalmic emulsion Place 1 drop into both eyes 2 (two) times daily.   levothyroxine (SYNTHROID) 125 MCG tablet TAKE 1 TABLET BY MOUTH DAILY ON AN EMPTY STOMACH WITH ONLY WATER FOR 30 MINUTES(NO ANTACIDS MEDICATIONS,  CALCIUM OR MAGNESIUM FOR 4 HOURS) (Patient taking differently: Take 125 mcg by mouth daily before breakfast.)   loratadine (CLARITIN) 10 MG tablet Take 10 mg by mouth daily as needed for allergies.   Mesalamine (ASACOL PO) Take by mouth. Take 2 tablets once daily   Multiple Vitamin (THERA VITAMIN PO) Take 1 tablet by mouth daily.   Omega-3 Fatty Acids (FISH OIL) 1200 MG CAPS Take 1,200 mg by mouth daily.    pantoprazole (PROTONIX) 40 MG tablet Take 1 tablet  2 x /day  for Heartburn & Indigestion (Patient taking differently: Take 40 mg by mouth 2 (two) times daily.)   Probiotic Product (PROBIOTIC PO) Take 1 capsule by mouth daily.   PROLIA 60 MG/ML SOSY injection Inject 60 mg into the skin every 6 (six) months.   pseudoephedrine (SUDAFED) 120 MG 12 hr tablet Take  1 tablet  2 x /day (every 12 hours)  for Head and Chest Congestion (Patient taking differently: Take 120 mg by mouth daily as needed for congestion (Head and chest congestion).)   simvastatin (ZOCOR) 20 MG tablet Take  1 tablet  at Bedtime  for Cholesterol                                                 /                        TAKE             BY MOUTH (Patient taking differently: Take 20 mg by mouth daily at 6 PM.)   vancomycin (VANCOCIN) 125 MG capsule Take 125 mg by mouth 4 (four) times daily.   colestipol (COLESTID) 1 g tablet Take 4 g by mouth daily. (Patient not taking: Reported on 02/14/2023)   loperamide (IMODIUM) 2 MG capsule Take 4 mg by mouth 2 (two) times daily. (Patient not taking: Reported on 02/14/2023)   No current facility-administered medications on file prior to visit.    ROS: all negative except what is noted in the HPI.   Physical Exam:  BP (!) 130/90   Pulse 78   Temp 98.9 F (37.2 C)   Ht 5\' 5"  (1.651 m)   Wt 112 lb 3.2 oz (50.9 kg)   SpO2 96%   BMI 18.67 kg/m   General Appearance: NAD.  Awake, conversant and cooperative. Eyes: PERRLA, EOMs intact.  Sclera white.  Conjunctiva without erythema. Sinuses:  No frontal/maxillary tenderness.  No nasal discharge. Nares patent.  ENT/Mouth: Ext aud canals clear.  Bilateral TMs w/DOL and without erythema or bulging. Hearing intact.  Posterior pharynx without swelling or exudate.  Tonsils without swelling or erythema.  Neck: Supple.  No  masses, nodules or thyromegaly. Respiratory: Effort is regular with non-labored breathing. Breath sounds are equal bilaterally without rales, rhonchi, wheezing or stridor.  Cardio: RRR with no MRGs. Brisk peripheral pulses without edema.  Abdomen: Active BS in all four quadrants.  Soft and non-tender without guarding, rebound tenderness, hernias or masses. Lymphatics: Non tender without lymphadenopathy.  Musculoskeletal: Full ROM, 5/5 strength, normal ambulation.  No clubbing or cyanosis. Skin: Appropriate color for ethnicity. Warm without rashes, lesions, ecchymosis, ulcers.  Neuro: CN II-XII grossly normal. Normal muscle tone without cerebellar symptoms and intact sensation.   Psych: AO X 3,  appropriate mood and affect, insight and judgment.     Darrol Jump, NP 12:27 PM [facility]

## 2023-02-15 LAB — CBC WITH DIFFERENTIAL/PLATELET
Absolute Monocytes: 490 cells/uL (ref 200–950)
Basophils Absolute: 40 cells/uL (ref 0–200)
Basophils Relative: 0.7 %
Eosinophils Absolute: 171 cells/uL (ref 15–500)
Eosinophils Relative: 3 %
HCT: 36.1 % (ref 35.0–45.0)
Hemoglobin: 12 g/dL (ref 11.7–15.5)
Lymphs Abs: 1112 cells/uL (ref 850–3900)
MCH: 31.5 pg (ref 27.0–33.0)
MCHC: 33.2 g/dL (ref 32.0–36.0)
MCV: 94.8 fL (ref 80.0–100.0)
MPV: 10.5 fL (ref 7.5–12.5)
Monocytes Relative: 8.6 %
Neutro Abs: 3887 cells/uL (ref 1500–7800)
Neutrophils Relative %: 68.2 %
Platelets: 237 10*3/uL (ref 140–400)
RBC: 3.81 10*6/uL (ref 3.80–5.10)
RDW: 13.8 % (ref 11.0–15.0)
Total Lymphocyte: 19.5 %
WBC: 5.7 10*3/uL (ref 3.8–10.8)

## 2023-02-15 LAB — COMPLETE METABOLIC PANEL WITH GFR
AG Ratio: 1.9 (calc) (ref 1.0–2.5)
ALT: 14 U/L (ref 6–29)
AST: 22 U/L (ref 10–35)
Albumin: 4 g/dL (ref 3.6–5.1)
Alkaline phosphatase (APISO): 58 U/L (ref 37–153)
BUN: 10 mg/dL (ref 7–25)
CO2: 26 mmol/L (ref 20–32)
Calcium: 9.2 mg/dL (ref 8.6–10.4)
Chloride: 103 mmol/L (ref 98–110)
Creat: 0.76 mg/dL (ref 0.60–0.95)
Globulin: 2.1 g/dL (calc) (ref 1.9–3.7)
Glucose, Bld: 87 mg/dL (ref 65–99)
Potassium: 3.8 mmol/L (ref 3.5–5.3)
Sodium: 138 mmol/L (ref 135–146)
Total Bilirubin: 0.3 mg/dL (ref 0.2–1.2)
Total Protein: 6.1 g/dL (ref 6.1–8.1)
eGFR: 79 mL/min/{1.73_m2} (ref >=60)

## 2023-02-15 LAB — HM COLONOSCOPY

## 2023-02-15 LAB — MAGNESIUM: Magnesium: 1.8 mg/dL (ref 1.5–2.5)

## 2023-02-17 DIAGNOSIS — I10 Essential (primary) hypertension: Secondary | ICD-10-CM | POA: Diagnosis not present

## 2023-02-17 DIAGNOSIS — E782 Mixed hyperlipidemia: Secondary | ICD-10-CM | POA: Diagnosis not present

## 2023-02-17 DIAGNOSIS — E039 Hypothyroidism, unspecified: Secondary | ICD-10-CM | POA: Diagnosis not present

## 2023-02-17 DIAGNOSIS — K2100 Gastro-esophageal reflux disease with esophagitis, without bleeding: Secondary | ICD-10-CM | POA: Diagnosis not present

## 2023-02-19 DIAGNOSIS — M810 Age-related osteoporosis without current pathological fracture: Secondary | ICD-10-CM | POA: Diagnosis not present

## 2023-02-20 ENCOUNTER — Ambulatory Visit (INDEPENDENT_AMBULATORY_CARE_PROVIDER_SITE_OTHER): Payer: Medicare Other | Admitting: *Deleted

## 2023-02-20 DIAGNOSIS — M8000XG Age-related osteoporosis with current pathological fracture, unspecified site, subsequent encounter for fracture with delayed healing: Secondary | ICD-10-CM | POA: Diagnosis not present

## 2023-02-20 MED ORDER — ROMOSOZUMAB-AQQG 105 MG/1.17ML ~~LOC~~ SOSY
210.0000 mg | PREFILLED_SYRINGE | Freq: Once | SUBCUTANEOUS | Status: AC
  Administered 2023-02-20: 210 mg via SUBCUTANEOUS

## 2023-02-20 NOTE — Progress Notes (Signed)
Patient is here for nurse visit for her 11th Evenity injection. Patient received bilateral Zion arm injections. She tolerated injections well. She will return in 1 month for her next Evenity injection.

## 2023-02-25 DIAGNOSIS — K52839 Microscopic colitis, unspecified: Principal | ICD-10-CM

## 2023-02-25 MED ORDER — PANTOPRAZOLE 40 MG TABLET,DELAYED RELEASE
ORAL_TABLET | 3 refills | 0 days | Status: CP
Start: 2023-02-25 — End: ?

## 2023-02-25 NOTE — Unmapped (Signed)
Last visit:12/18/22    Next visit:    Labs:08/10/22    Medication refilled: Protonix refill [phone]

## 2023-02-26 ENCOUNTER — Other Ambulatory Visit: Payer: Self-pay

## 2023-02-26 MED ORDER — SIMVASTATIN 20 MG PO TABS: ORAL_TABLET | ORAL | 3 refills | Status: AC

## 2023-02-26 MED ORDER — MESALAMINE 1.2 GRAM TABLET,DELAYED RELEASE
ORAL_TABLET | Freq: Two times a day (BID) | ORAL | 5 refills | 30 days | Status: CP
Start: 2023-02-26 — End: ?

## 2023-02-26 NOTE — Unmapped (Signed)
Patient c/o onging diarrhea and urgency. Taking mesalamine 2/4g in the morning and reports mornings are better but later in the day she has increased symptoms.     Per Dr. Jacqulyn Bath, on to increase mesalamine 2.4g BID.  New RX sent to pharmacy  Patient aware

## 2023-02-27 NOTE — Unmapped (Addendum)
Update: 03/07/23  Per Santina Evans with [facility], patient has been approved for Stelara drug assistance with [facility]. They are calling her to arrange shipment today. Injection dose due 03/12/23        02/28/2023  Hello! Thanks for looping [place] in.  We will follow up with [facility] directly and confirm info/patient status, then update referral thereafter.  Thanks so much!  ___________________________________________________________________________  Jannifer Franklin, CPhT - Pharmacy Technician Supervisor - Medication Assistance Program  Department of Pharmacy  [facility]  p [phone] - f [phone]  Morrie [email or web address]         02/27/2023  Rema Jasmine with [employer] received order from [facility] to supply patient with Stelara injections.  This is a new pathway [facility] is using in order to help patients with mfg drug assistance.    Rx sent to [facility] today, along with supporting documentation. Patient's Stelara loading dose was received on 01/15/23. First injection dose will be due 03/12/23.    Patient has been updated  Message routed to [facility] to update referral

## 2023-03-01 NOTE — Progress Notes (Signed)
Office Visit Note  Patient: Debbie Houston             Date of Birth: 11/01/1941           MRN: [id number]             PCP: Lucky Cowboy, MD Referring: Lucky Cowboy, MD Visit Date: 03/14/2023 Occupation: @GUAROCC @  Subjective:  No chief complaint on file.   History of Present Illness: Debbie Houston is a 82 y.o. female with history of osteoarthritis, degenerative disc disease and ulcerative colitis.  She states that she has been experiencing diarrhea for the last 1 month.  She was diagnosed with C. difficile infection and was started on vancomycin.  She was also started on Stelara for ulcerative colitis flare.  She has been on vancomycin for 1 month now.  She continues to have diarrhea and urgency.  She continues to have pain and discomfort in her bilateral hands, right hip and her feet.  She has not noticed any joint swelling.  She continues to have pain and discomfort in her neck and lower back.  She plans to see her neurologist for the neck pain.  She has been going to Dr. Cleophas Dunker and is still is on Prolia and Evenity combination.    Activities of Daily Living:  Patient reports morning stiffness for all day. Patient Reports nocturnal pain.  Difficulty dressing/grooming: Denies Difficulty climbing stairs: Denies Difficulty getting out of chair: Denies Difficulty using hands for taps, buttons, cutlery, and/or writing: Reports  Review of Systems  Constitutional:  Positive for fatigue.  HENT:  Positive for mouth sores and mouth dryness.   Eyes:  Positive for dryness.  Respiratory:  Negative for shortness of breath.   Cardiovascular:  Negative for chest pain and palpitations.  Gastrointestinal:  Positive for diarrhea. Negative for blood in stool and constipation.  Endocrine: Positive for increased urination.  Genitourinary:  Negative for involuntary urination.  Musculoskeletal:  Positive for joint pain, joint pain, joint swelling, myalgias, muscle weakness, morning  stiffness, muscle tenderness and myalgias. Negative for gait problem.  Skin:  Negative for color change, rash, hair loss and sensitivity to sunlight.  Allergic/Immunologic: Positive for susceptible to infections.  Neurological:  Positive for dizziness. Negative for headaches.  Hematological:  Negative for swollen glands.  Psychiatric/Behavioral:  Negative for depressed mood and sleep disturbance. The patient is not nervous/anxious.     PMFS History:  Patient Active Problem List   Diagnosis Date Noted   Sepsis secondary to UTI 06/24/2022   Extensor tenosynovitis of right wrist 06/23/2022   Thrombocytopenia 06/23/2022   Prediabetes 06/23/2022   Intertrochanteric fracture of right femur, closed, with nonunion, subsequent encounter 01/22/2022   History of ulcerative colitis 07/10/2021   Complete right bundle branch block (RBBB) 05/15/2021   Body mass index (BMI) of 19.0-19.9 in adult 05/12/2021   Bilateral nephrolithiasis 09/21/2020   Senile purpura 08/17/2020   Bilateral temporomandibular joint pain 06/15/2020   Spondylolisthesis at L4-L5 level 11/25/2018   Essential hypertension    Hyperlipidemia, mixed    Hypothyroid    GERD    Asthma    DJD (degenerative joint disease)    Vitamin D deficiency    Osteoporosis with current pathological fracture    Allergy     Past Medical History:  Diagnosis Date   Arthritis    Benign labile hypertension    Cancer    skin cancer on face   Colitis    DJD (degenerative joint disease)  Endometrial polyp    GERD (gastroesophageal reflux disease)    History of basal cell carcinoma excision    History of esophagitis    HOH (hard of hearing)    right ear better than left  -- wears no aides   Hyperlipidemia    Hypothyroidism    Neuromuscular disorder    neuropathy in feet   Osteopenia    Prediabetes    S/P right hip fracture 08/17/2020   Sepsis 2023   per patient   Vitamin D deficiency    Wears glasses     Family History  Problem  Relation Age of Onset   Hypertension Mother    Cancer Mother        COLON   Hyperlipidemia Mother    Cancer Father        BREAST WITH BRAIN METS   Hyperlipidemia Father    Cancer Sister        COLON (TEENS), BREAST (50)   Hyperlipidemia Sister    Diabetes Brother    Hyperlipidemia Brother    Hypertension Brother    Colon cancer Brother 50   Cancer Maternal Grandmother        RENAL   Hyperlipidemia Maternal Grandmother    Diabetes Maternal Grandfather    Stroke Maternal Grandfather    Hyperlipidemia Maternal Grandfather    Hyperlipidemia Paternal Grandmother    Hyperlipidemia Paternal Grandfather    Autism Son    Colitis Son    Autism spectrum disorder Son    Healthy Son    Past Surgical History:  Procedure Laterality Date   BACK SURGERY  2020   CATARACT EXTRACTION, BILATERAL Bilateral 2020   CONVERSION TO TOTAL HIP Right 01/22/2022   Procedure: CONVERSION TO TOTAL HIP;  Surgeon: Joen Laura, MD;  Location: [facility];  Service: Orthopedics;  Laterality: Right;   DILATATION & CURETTAGE/HYSTEROSCOPY WITH MYOSURE N/A 10/27/2014   Procedure: DILATATION & CURETTAGE/HYSTEROSCOPY WITH MYOSURE;  Surgeon: Juluis Mire, MD;  Location: [facility];  Service: Gynecology;  Laterality: N/A;   DILATION AND CURETTAGE OF UTERUS     HIP SURGERY Right 04/2020   rod and screw in right hip    MOHS SURGERY  2005   LEFT NASAL BRIDGE FOR BASAL CELL   TONSILLECTOMY  age 58   Social History   Social History Narrative   Are you right handed or left handed? Left Handed    Are you currently employed ? No   What is your current occupation? No. Use to teacher [employer]    Do you live at home alone? No   Who lives with you? Lives with husband    What type of home do you live in: 1 story or 2 story? One story home              Immunization History  Administered Date(s) Administered   Influenza, High Dose Seasonal PF 07/23/2014, 08/23/2015, 07/10/2016, 08/27/2017,  08/22/2018, 08/25/2019, 08/30/2020, 08/21/2021, 08/21/2022   Moderna SARS-COV2 Booster Vaccination 08/21/2022   PFIZER(Purple Top)SARS-COV-2 Vaccination 12/10/2019, 12/31/2019, 08/16/2020, 04/14/2021   Pneumococcal Conjugate-13 05/05/2015   Pneumococcal-Unspecified 08/22/2011   Tdap 08/22/2011   Zoster Recombinat (Shingrix) 09/13/2017, 01/01/2018   Zoster, Live 06/20/2006     Objective: Vital Signs: BP (!) 140/80 (BP Location: Left Arm, Patient Position: Sitting, Cuff Size: Normal)   Pulse 69   Resp 13   Ht 5' 4.5" (1.638 m)   Wt 110 lb 6.4 oz (50.1 kg)   BMI 18.66 kg/m  Physical Exam Vitals and nursing note reviewed.  Constitutional:      Appearance: She is well-developed.  HENT:     Head: Normocephalic and atraumatic.  Eyes:     Conjunctiva/sclera: Conjunctivae normal.  Cardiovascular:     Rate and Rhythm: Normal rate and regular rhythm.     Heart sounds: Normal heart sounds.  Pulmonary:     Effort: Pulmonary effort is normal.     Breath sounds: Normal breath sounds.  Abdominal:     General: Bowel sounds are normal.     Palpations: Abdomen is soft.  Musculoskeletal:     Cervical back: Normal range of motion.  Lymphadenopathy:     Cervical: No cervical adenopathy.  Skin:    General: Skin is warm and dry.     Capillary Refill: Capillary refill takes less than 2 seconds.  Neurological:     Mental Status: She is alert and oriented to person, place, and time.  Psychiatric:        Behavior: Behavior normal.      Musculoskeletal Exam: She had full rotation, flexion extension with discomfort.  She had limited range of motion of her lumbar spine with discomfort on palpation over lower lumbar region.  Shoulder joints, elbow joints, wrist joints were in good range of motion.  She had bilateral PIP and DIP thickening with subluxation of several of her DIP joints.  No synovitis was noted.  Hip joints and knee joints in good range of motion.  There was no tenderness over  ankles or MTPs.  CDAI Exam: CDAI Score: -- Patient Global: --; Provider Global: -- Swollen: --; Tender: -- Joint Exam 03/14/2023   No joint exam has been documented for this visit   There is currently no information documented on the homunculus. Go to the Rheumatology activity and complete the homunculus joint exam.  Investigation: No additional findings.  Imaging: No results found.  Recent Labs: Lab Results  Component Value Date   WBC 5.7 02/14/2023   HGB 12.0 02/14/2023   PLT 237 02/14/2023   NA 138 02/14/2023   K 3.8 02/14/2023   CL 103 02/14/2023   CO2 26 02/14/2023   GLUCOSE 87 02/14/2023   BUN 10 02/14/2023   CREATININE 0.76 02/14/2023   BILITOT 0.3 02/14/2023   ALKPHOS 42 06/22/2022   AST 22 02/14/2023   ALT 14 02/14/2023   PROT 6.1 02/14/2023   ALBUMIN 3.4 (L) 06/22/2022   CALCIUM 9.2 02/14/2023   GFRAA 86 05/15/2021    Speciality Comments: Stelara March 06, 2023  Procedures:  No procedures performed Allergies: Remicade [infliximab], Fosamax [alendronate sodium], Singulair [montelukast sodium], Wellbutrin [bupropion], Clindamycin/lincomycin, and Sulfa antibiotics   Assessment / Plan:     Visit Diagnoses: Positive ANA (antinuclear antibody) - 10/22/19: ANA 1:80 NS, 01/06/20: ENA negative.  She has no clinical features of autoimmune disease.  Patient denies any history of oral ulcers, nasal ulcers, malar rash, photosensitivity, inflammatory arthritis, Raynaud's or lymphadenopathy.  Primary osteoarthritis of both hands-she has severe osteoarthritis involving bilateral hands with incomplete fist formation and decreased grip strength.  She also has CMC subluxation.  Joint protection muscle strengthening was discussed.  She has tried physical therapy in the past.  S/P hip replacement, right - Dr. Blanchie Dessert.  She continues to have discomfort in the right hip joint off-and-on.  She had good range of motion.  Primary osteoarthritis of both feet-she complains of  discomfort in her bilateral feet.  Bilateral PIP and DIP thickening with no synovitis was noted.  DDD (degenerative disc disease), cervical-she has been experiencing creased discomfort in her cervical spine.  She had limited range of motion on lateral rotation, flexion and extension.  She plans to see her neurologist.  Range of motion exercises were discussed.  Spondylolisthesis at L4-L5 level - she is followed by Dr. Danielle Dess.  She has chronic lower back pain.  Core strengthening exercises were discussed.  Age-related osteoporosis without current pathological fracture - DEXA scan from December 26, 2020 T score was -2.5 BMD 0.569 in the lumbar spine.  She has been on Prolia injections and Evenity by Dr. Cleophas Dunker.  Vitamin D deficiency-she takes vitamin D supplement.  Abnormal SPEP  History of ulcerative colitis - patient states that She had a reaction to Remicade and it was discontinued.  She is seeing [facility] gastroenterologist.  She is on Asacol.  Patient states she was started on Stelara infusions in April 2024.  She is uncertain if it is helping at this time.  She denies any adverse side effects from the Stelara.  Clostridioides difficile infection-patient states she was diagnosed with C. difficile infection about a month ago.  She was started on vancomycin which she has been taking for a month now.  She will have follow-up appointment with her gastroenterologist.  History of gastroesophageal reflux (GERD)  History of hyperlipidemia  Essential hypertension  History of hypothyroidism  History of asthma  Orders: No orders of the defined types were placed in this encounter.  No orders of the defined types were placed in this encounter.    Follow-Up Instructions: Return in about 6 months (around 09/13/2023) for Osteoarthritis.   Pollyann Savoy, MD  Note - This record has been created using Animal nutritionist.  Chart creation errors have been sought, but may not always  have been  located. Such creation errors do not reflect on  the standard of medical care.

## 2023-03-04 ENCOUNTER — Encounter: Payer: Self-pay | Admitting: *Deleted

## 2023-03-07 NOTE — Progress Notes (Signed)
Assessment and Plan:  Debbie Houston was seen today for otalgia.  Diagnoses and all orders for this visit:  Essential hypertension - continue medications, DASH diet, exercise and monitor at home. Call if greater than 130/80.    Seasonal allergies Please change from Claritin to generic allegra or Zyrtec Use Mucinex twice a day for the next several days Continue Flonase 1 spray each nostril twice a day If symptoms do not improve in the next week notify the office      Further disposition pending results of labs. Discussed med's effects and SE's.   Over 30 minutes of exam, counseling, chart review, and critical decision making was performed.   Future Appointments  Date Time Provider Department Center  03/14/2023  1:40 PM Pollyann Savoy, MD [facility] None  03/25/2023 11:00 AM Raynelle Dick, NP [facility] None  03/26/2023 11:30 AM [facility] NURSE [facility]  04/22/2023  2:30 PM [name], DO [facility] None  04/29/2023 10:30 AM [name], DO [facility] None  06/25/2023  2:00 PM Lucky Cowboy, MD [facility] None    ------------------------------------------------------------------------------------------------------------------   HPI BP 132/80   Pulse 73   Temp 97.7 F (36.5 C)   Ht  (1.651 m)   Wt 110 lb (49.9 kg)   SpO2 98%   BMI 18.30 kg/m   82 y.o.female presents for complaints of congestion, headaches, dizziness, pain in ears x several weeks. She is on Claritin and flonase for allergies.   Currently on Vancomycin for C. Difficile- has been on x 4 weeks.    BP is currently well controlled with Bisoprolol 5 mg. Denies headaches, chest pain , shortness of breath and dizziness BP Readings from Last 3 Encounters:  03/08/23 132/80  02/14/23 (!) 130/90  01/21/23 124/70   BMI is Body mass index is 18.3 kg/m. Wt Readings from Last 3 Encounters:  03/08/23 110 lb (49.9 kg)  02/14/23 112 lb 3.2 oz (50.9 kg)  01/21/23 109 lb 9.6 oz (49.7 kg)    Past Medical History:   Diagnosis Date   Arthritis    Benign labile hypertension    Cancer    skin cancer on face   Colitis    DJD (degenerative joint disease)    Endometrial polyp    GERD (gastroesophageal reflux disease)    History of basal cell carcinoma excision    History of esophagitis    HOH (hard of hearing)    right ear better than left  -- wears no aides   Hyperlipidemia    Hypothyroidism    Neuromuscular disorder    neuropathy in feet   Osteopenia    Prediabetes    S/P right hip fracture 08/17/2020   Vitamin D deficiency    Wears glasses      Allergies  Allergen Reactions   Remicade [Infliximab]    Fosamax [Alendronate Sodium] Other (See Comments)    GI upset    Singulair [Montelukast Sodium] Other (See Comments)    Makes pt jittery   Wellbutrin [Bupropion] Hives   Clindamycin/Lincomycin Rash   Sulfa Antibiotics Rash    Current Outpatient Medications on File Prior to Visit  Medication Sig   acetaminophen (TYLENOL) 500 MG tablet Take 500 mg by mouth at bedtime as needed for moderate pain or headache.    Ascorbic Acid (VITAMIN C) 1000 MG tablet Take 1,000 mg by mouth daily.    aspirin EC 81 MG tablet Take 1 tablet (81 mg total) by mouth in the morning and at bedtime. (Patient taking  differently: Take 81 mg by mouth daily.)   bisoprolol (ZEBETA) 5 MG tablet Take 1 tablet every Morning for BP   Calcium Carbonate-Vit D-Min (CALCIUM 1200 PO) Take 1,200 mg by mouth daily.   CINNAMON PO Take 2,000 mg by mouth daily.   Coenzyme Q10 (COQ10) 200 MG CAPS Take 200 mg by mouth daily.   cycloSPORINE (RESTASIS) 0.05 % ophthalmic emulsion Place 1 drop into both eyes 2 (two) times daily.   Fluticasone Propionate (FLONASE NA) Place into the nose.   levothyroxine (SYNTHROID) 125 MCG tablet TAKE 1 TABLET BY MOUTH DAILY ON AN EMPTY STOMACH WITH ONLY WATER FOR 30 MINUTES(NO ANTACIDS MEDICATIONS, CALCIUM OR MAGNESIUM FOR 4 HOURS) (Patient taking differently: Take 125 mcg by mouth daily before breakfast.)    loratadine (CLARITIN) 10 MG tablet Take 10 mg by mouth daily as needed for allergies.   Mesalamine (ASACOL PO) Take by mouth. Take once daily   Multiple Vitamin (THERA VITAMIN PO) Take 1 tablet by mouth daily.   Omega-3 Fatty Acids (FISH OIL) 1200 MG CAPS Take 1,200 mg by mouth daily.    pantoprazole (PROTONIX) 40 MG tablet Take 1 tablet  2 x /day  for Heartburn & Indigestion (Patient taking differently: Take 40 mg by mouth 2 (two) times daily.)   Probiotic Product (PROBIOTIC PO) Take 1 capsule by mouth daily.   PROLIA 60 MG/ML SOSY injection Inject 60 mg into the skin every 6 (six) months.   pseudoephedrine (SUDAFED) 120 MG 12 hr tablet Take  1 tablet  2 x /day (every 12 hours)  for Head and Chest Congestion (Patient taking differently: Take 120 mg by mouth daily as needed for congestion (Head and chest congestion).)   simvastatin (ZOCOR) 20 MG tablet Take 1 tablet at Bedtime for Cholesterol   STELARA 45 MG/0.5ML SOLN Inject into the skin.   vancomycin (VANCOCIN) 125 MG capsule Take 125 mg by mouth 4 (four) times daily.   colestipol (COLESTID) 1 g tablet Take 4 g by mouth daily. (Patient not taking: Reported on 02/14/2023)   loperamide (IMODIUM) 2 MG capsule Take 4 mg by mouth 2 (two) times daily. (Patient not taking: Reported on 02/14/2023)   No current facility-administered medications on file prior to visit.    ROS: all negative except above.   Physical Exam:  BP 132/80   Pulse 73   Temp 97.7 F (36.5 C)   Ht  (1.651 m)   Wt 110 lb (49.9 kg)   SpO2 98%   BMI 18.30 kg/m   General Appearance: Well nourished, in no apparent distress. Eyes: PERRLA, EOMs, conjunctiva no swelling or erythema Sinuses: No Frontal/maxillary tenderness ENT/Mouth: Ext aud canals clear, TMs without erythema, bulging. Fluid noted in left ear. No erythema, swelling, or exudate on post pharynx.  Tonsils not swollen or erythematous. Hearing normal.  Neck: Supple, thyroid normal.  Respiratory:  Respiratory effort normal, BS equal bilaterally without rales, rhonchi, wheezing or stridor.  Cardio: RRR with no MRGs. Brisk peripheral pulses without edema.  Abdomen: Soft, + BS.  Non tender, no guarding, rebound, hernias, masses. Lymphatics: Non tender without lymphadenopathy.  Musculoskeletal: Full ROM, 5/5 strength, normal gait.  Skin: Warm, dry without rashes, lesions, ecchymosis.  Neuro: Cranial nerves intact. Normal muscle tone, no cerebellar symptoms. Sensation intact.  Psych: Awake and oriented X 3, normal affect, Insight and Judgment appropriate.     Raynelle Dick, NP 11:40 AM [facility]

## 2023-03-08 ENCOUNTER — Encounter: Payer: Self-pay | Admitting: Nurse Practitioner

## 2023-03-08 ENCOUNTER — Ambulatory Visit (INDEPENDENT_AMBULATORY_CARE_PROVIDER_SITE_OTHER): Payer: Medicare Other | Admitting: Nurse Practitioner

## 2023-03-08 VITALS — BP 132/80 | HR 73 | Temp 97.7°F | Ht 65.0 in | Wt 110.0 lb

## 2023-03-08 DIAGNOSIS — I10 Essential (primary) hypertension: Secondary | ICD-10-CM

## 2023-03-08 DIAGNOSIS — J302 Other seasonal allergic rhinitis: Secondary | ICD-10-CM

## 2023-03-08 NOTE — Patient Instructions (Addendum)
Please change from Claritin to generic allegra or Zyrtec Use Mucinex twice a day for the next several days Continue Flonase 1 spray each nostril twice a day  Allergies, Adult An allergy is a condition that causes the body's defense system (immune system) to react too strongly to an allergen. An allergen is a substance that is harmless to most people but can cause a reaction in some people. Allergies often affect the nose (allergic rhinitis), eyes (conjunctivitis), skin (atopic dermatitis), and stomach. They can be mild, moderate, or severe. They cannot spread from person to person. Allergies can start at any age. In some cases, they may go away as you get older. What are the causes? Allergies are caused by allergens. These may be: Outdoor allergens. These include pollen, car fumes, and mold. Indoor allergens. These include dust, smoke, mold, and pet dander. Other allergens. These include foods, medicines, scents, and insect bites or stings. What increases the risk? You are more likely to have allergies if you have: Family members with allergies. Family members who have a condition that may be caused by allergens, such as asthma. What are the signs or symptoms? Symptoms depend on how severe your allergy is. Mild to moderate symptoms Runny nose, stuffy nose (nasal congestion), or sneezing. Itchy mouth, ears, or throat. Postnasal drip. This is a feeling of mucus dripping down the back of your throat. Sore throat. Itchy, red, watery, or puffy eyes. Skin rash, or itchy, red, swollen areas of skin (hives). Stomach cramps or bloating. Severe symptoms A bad allergy to food, medicine, or insect bites may cause a severe reaction (anaphylactic reaction). Symptoms include: A red face. Coughing or making high-pitched whistling sounds when you breathe, most often when you breathe out (wheezing). Swollen lips, tongue, or mouth. A tight or swollen throat. Chest pain or tightness, or a fast  heartbeat. Trouble breathing or shortness of breath. Pain in your abdomen. Vomiting or diarrhea. Feeling dizzy or fainting. How is this diagnosed? Allergies are diagnosed based on your symptoms, your family and medical history, and a physical exam. You may also have tests, such as: Skin tests. These may be done to see how your skin reacts to allergens. Tests include: Skin prick test. For this test, the allergen is put in your body through a small prick in the skin. Intradermal skin test. For this test, a small amount of the allergen is put under the first layer of your skin. Patch test. For this test, a small amount of the allergen is placed on your skin. The area is covered and then checked after a few days. Blood tests. A challenge test. For this test, you eat or breathe in the allergen to see if you have a reaction. You may also be asked to: Keep a food diary. This means writing down all the foods, drinks, and symptoms you have in a day. Try an elimination diet. To do this: Stop eating certain foods. Add those foods back one by one to find out if any of them cause a reaction. How is this treated?     Treatment for allergies depends on your symptoms. It may include: Cold, wet cloths (cold compresses). These can be used to soothe itching and swelling. Eye drops or nasal sprays. A saline solution to clear out your nose and keep it moist (nasal irrigation). A saline solution is made of salt and water. A humidifier. This can add moisture to the air. Skin creams. These can treat rashes or itching. Diet changes to  cut out foods that cause allergies. Being exposed again and again to tiny amounts of allergens. This can help your body build a defense against them (tolerance). This process is called immunotherapy. It may be done using: Allergy shots. This is when you get a shot of the allergen. Sublingual immunotherapy. This is when you take a small dose of the allergen under your  tongue. Allergy medicines (antihistamines) or other medicines. These can help block the allergic reaction. Using an auto-injector pen. An auto-injector pen is a device filled with medicine that gives an emergency shot of epinephrine. Your health care provider will teach you how to use it. Follow these instructions at home: Medicines  Take or apply over-the-counter and prescription medicines only as told by your provider. Always carry your auto-injector pen if you are at risk of an anaphylactic reaction. Give yourself the shot as told by your provider. Eating and drinking Follow instructions from your provider about what you may eat and drink. Drink enough fluid to keep your pee (urine) pale yellow. General instructions Wear a medical alert bracelet or necklace if you have had an anaphylactic reaction in the past. Avoid known allergens when you can. Keep all follow-up visits. Your provider will watch your symptoms and talk about treatment options with you. Contact a health care provider if: Your symptoms do not get better with treatment. Get help right away if: You have any symptoms of anaphylactic reaction. You use an auto-injector pen. You will need more medical care even if the medicine seems to be working. An anaphylactic reaction may happen again within 72 hours (rebound anaphylaxis). These symptoms may be an emergency. Use the auto-injector pen right away. Then call 911. Do not wait to see if the symptoms will go away. Do not drive yourself to the hospital. This information is not intended to replace advice given to you by your health care provider. Make sure you discuss any questions you have with your health care provider. Document Revised: 07/18/2022 Document Reviewed: 07/18/2022 Elsevier Patient Education  2023 [employer].

## 2023-03-14 ENCOUNTER — Ambulatory Visit: Payer: Medicare Other | Attending: Rheumatology | Admitting: Rheumatology

## 2023-03-14 ENCOUNTER — Encounter: Payer: Self-pay | Admitting: Rheumatology

## 2023-03-14 VITALS — BP 140/80 | HR 69 | Resp 13 | Ht 64.5 in | Wt 110.4 lb

## 2023-03-14 DIAGNOSIS — M19071 Primary osteoarthritis, right ankle and foot: Secondary | ICD-10-CM | POA: Insufficient documentation

## 2023-03-14 DIAGNOSIS — A498 Other bacterial infections of unspecified site: Secondary | ICD-10-CM | POA: Diagnosis not present

## 2023-03-14 DIAGNOSIS — M19041 Primary osteoarthritis, right hand: Secondary | ICD-10-CM | POA: Diagnosis not present

## 2023-03-14 DIAGNOSIS — R778 Other specified abnormalities of plasma proteins: Secondary | ICD-10-CM | POA: Insufficient documentation

## 2023-03-14 DIAGNOSIS — M19072 Primary osteoarthritis, left ankle and foot: Secondary | ICD-10-CM

## 2023-03-14 DIAGNOSIS — I10 Essential (primary) hypertension: Secondary | ICD-10-CM | POA: Insufficient documentation

## 2023-03-14 DIAGNOSIS — M810 Age-related osteoporosis without current pathological fracture: Secondary | ICD-10-CM

## 2023-03-14 DIAGNOSIS — Z96641 Presence of right artificial hip joint: Secondary | ICD-10-CM | POA: Diagnosis not present

## 2023-03-14 DIAGNOSIS — Z8719 Personal history of other diseases of the digestive system: Secondary | ICD-10-CM | POA: Diagnosis not present

## 2023-03-14 DIAGNOSIS — E559 Vitamin D deficiency, unspecified: Secondary | ICD-10-CM | POA: Diagnosis not present

## 2023-03-14 DIAGNOSIS — R768 Other specified abnormal immunological findings in serum: Secondary | ICD-10-CM | POA: Diagnosis not present

## 2023-03-14 DIAGNOSIS — M19042 Primary osteoarthritis, left hand: Secondary | ICD-10-CM

## 2023-03-14 DIAGNOSIS — Z8709 Personal history of other diseases of the respiratory system: Secondary | ICD-10-CM | POA: Diagnosis not present

## 2023-03-14 DIAGNOSIS — M4316 Spondylolisthesis, lumbar region: Secondary | ICD-10-CM

## 2023-03-14 DIAGNOSIS — Z8639 Personal history of other endocrine, nutritional and metabolic disease: Secondary | ICD-10-CM | POA: Insufficient documentation

## 2023-03-14 DIAGNOSIS — R2681 Unsteadiness on feet: Secondary | ICD-10-CM

## 2023-03-14 DIAGNOSIS — M5030 Other cervical disc degeneration, unspecified cervical region: Secondary | ICD-10-CM

## 2023-03-19 DIAGNOSIS — M47812 Spondylosis without myelopathy or radiculopathy, cervical region: Secondary | ICD-10-CM | POA: Diagnosis not present

## 2023-03-19 DIAGNOSIS — M79641 Pain in right hand: Secondary | ICD-10-CM | POA: Diagnosis not present

## 2023-03-19 DIAGNOSIS — G4486 Cervicogenic headache: Secondary | ICD-10-CM | POA: Diagnosis not present

## 2023-03-20 NOTE — Progress Notes (Deleted)
3 MONTH FOLLOW UP  Assessment:    Essential hypertension - continue medications, DASH diet, exercise and monitor at home. Call if greater than 130/80.   Uncomplicated asthma, unspecified asthma severity, unspecified whether persistent Doing well on current regimen, continue with benefit Continue to monitor  Colitis Having worsening episodes of diarrhea Continue to follow with Dr. Jacqulyn Bath  Senile Purpura Has followed with Dr Corliss Skains  GERD Continue PPI/H2 blocker, diet discussed  Hypothyroidism, unspecified type Last TSH elevated Will change dosage to 1 tab T-TH-Sat and 1/2 tab the other days  Osteoarthritis, unspecified osteoarthritis type, unspecified site Doing well, tylenol PRN, Dr. Madelon Lips follows  Osteoporosis with delayed healing of right intertrochanteric fracture of femur Continue Calcium & Vit D Dr. Cleophas Dunker following for prolia, she is to be starting a new injection for bone growth but had to wait until impetigo infection resolved. 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 Jena Gauss Currently using bone stimulator  Complete Right Bundle Branch Block Monitor  Spondylolisthesis at L4-L5 level S/P lumbar fusion.  Doing well Following with Dr Danielle Dess  Mixed hyperlipidemia Continue current regimen Discussed dietary and exercise modifications   Vitamin D deficiency Continue Vit D supplementation  Complete Right Bundle Branch Block Monitor symptoms  Vertigo Monitor; meclizine PRN if needed  TMJ pain Avoid gum, hard foods, heat may help, follow up with dentist; did see ENT  Impetigo Can stop keflex- continue Mupirocin and monitor symptoms  Over 40 minutes of exam, counseling, chart review and critical decision making was performed Future Appointments  Date Time Provider Department Center  03/25/2023 11:00 AM Raynelle Dick, NP  [facility] None  03/26/2023 11:30 AM [facility] NURSE [facility]  04/22/2023  2:30 PM [name], DO [facility] None  04/29/2023 10:30 AM [name], DO [facility] None  06/25/2023  2:00 PM Lucky Cowboy, MD [facility] None  09/12/2023  1:10 PM [name], PA-C [facility] None       Subjective:  Debbie Houston is a 82 y.o. female who presents for Medicare Annual Wellness Visit and 3 month follow up.   She had fall with R hip/fremur fracture in June 2021 on vacation at the beach, had IM nail placed. She has displaced intertrochanteric fracture of right femur,  closed fracture with nonunion The bone did not heal correctly and is currently undergoing treatments with bone stimulator and following with Dr. Jena Gauss- bone stimulator currently, may require an additional surgery in the future  She had a DEXA 12/26/20 that showed left femoral neck with T -2.5. Following with Dr. Cleophas Dunker on prolia, and is to begin a shot for bone growthbut was delayed due to impetigo infection  Pt was taking Keflex for Impetigo. Started having more nausea with Keflex and hasn't been eating much. Impetigo has improved and almost completely resolved.  Pt had abnormal SPEP, was seen by Dr. Mosetta Putt- no signs of multiple myeloma. Continues to follow.  Pt is seeing Dr. Jacqulyn Bath in [facility] for ulcerative colitis and ruling out Crohns. Continues to have episodes of diarrhea   Continues to Have neck pain that radiates into head. Tylenol will help some  Following with Dr. Lorrine Kin.  She has had vertigo, TMJ pain, some mildly asymmetric hearing, saw ENT and now has hearing aids.   BMI is There is no height or weight on file to calculate BMI., she has been working on diet and exercise. Wt Readings from  Last 3 Encounters:  03/14/23 110 lb 6.4 oz (50.1 kg)  03/08/23 110 lb (49.9 kg)  02/14/23 112 lb 3.2 oz (50.9 kg)    Her blood pressure has been controlled at home, today their BP is   She does workout. She denies chest pain, shortness of  breath, dizziness.   She is on cholesterol medication (simvastatin 20 mg, cholestipol 2 g daily) and denies myalgias. Her cholesterol is at goal. The cholesterol last visit was:   Lab Results  Component Value Date   CHOL 167 12/25/2022   HDL 70 12/25/2022   LDLCALC 75 12/25/2022   TRIG 139 12/25/2022   CHOLHDL 2.4 12/25/2022   . She has been working on diet and exercise. and denies Last A1C in the office was:  Lab Results  Component Value Date   HGBA1C 5.7 (H) 12/25/2022   Last GFR: Lab Results  Component Value Date   GFRNONAA >60 06/24/2022   She is on thyroid medication. Currently taking Levothyroxine 125 mcg 1 tab Tues and Thurs and 1/2 tab the other days Lab Results  Component Value Date   TSH 1.49 12/25/2022   Patient is on Vitamin D supplement.   Lab Results  Component Value Date   VD25OH 59 12/25/2022      Medication Review: Current Outpatient Medications on File Prior to Visit  Medication Sig Dispense Refill   acetaminophen (TYLENOL) 500 MG tablet Take 500 mg by mouth at bedtime as needed for moderate pain or headache.      Ascorbic Acid (VITAMIN C) 1000 MG tablet Take 1,000 mg by mouth daily.      aspirin EC 81 MG tablet Take 1 tablet (81 mg total) by mouth in the morning and at bedtime. (Patient taking differently: Take 81 mg by mouth daily.) 60 tablet 0   bisoprolol (ZEBETA) 5 MG tablet Take 1 tablet every Morning for BP 90 tablet 3   Calcium Carbonate-Vit D-Min (CALCIUM 1200 PO) Take 1,200 mg by mouth daily.     CINNAMON PO Take 2,000 mg by mouth daily.     Coenzyme Q10 (COQ10) 200 MG CAPS Take 200 mg by mouth daily.     colestipol (COLESTID) 1 g tablet Take 4 g by mouth daily. (Patient not taking: Reported on 02/14/2023)     cycloSPORINE (RESTASIS) 0.05 % ophthalmic emulsion Place 1 drop into both eyes 2 (two) times daily.     Fluticasone Propionate (FLONASE NA) Place into the nose.     levothyroxine (SYNTHROID) 125 MCG tablet TAKE 1 TABLET BY MOUTH DAILY ON  AN EMPTY STOMACH WITH ONLY WATER FOR 30 MINUTES(NO ANTACIDS MEDICATIONS, CALCIUM OR MAGNESIUM FOR 4 HOURS) (Patient taking differently: Take 125 mcg by mouth daily before breakfast.) 90 tablet 0   loperamide (IMODIUM) 2 MG capsule Take 4 mg by mouth as needed.     loratadine (CLARITIN) 10 MG tablet Take 10 mg by mouth daily as needed for allergies.     Mesalamine (ASACOL PO) Take by mouth. Take once daily     Multiple Vitamin (THERA VITAMIN PO) Take 1 tablet by mouth daily.     Omega-3 Fatty Acids (FISH OIL) 1200 MG CAPS Take 1,200 mg by mouth daily.      pantoprazole (PROTONIX) 40 MG tablet Take 1 tablet  2 x /day  for Heartburn & Indigestion (Patient taking differently: Take 40 mg by mouth 2 (two) times daily.) 180 tablet 0   Probiotic Product (PROBIOTIC PO) Take 1 capsule by mouth daily.  PROLIA 60 MG/ML SOSY injection Inject 60 mg into the skin every 6 (six) months.     pseudoephedrine (SUDAFED) 120 MG 12 hr tablet Take  1 tablet  2 x /day (every 12 hours)  for Head and Chest Congestion (Patient taking differently: Take 120 mg by mouth daily as needed for congestion (Head and chest congestion).) 60 tablet 0   simvastatin (ZOCOR) 20 MG tablet Take 1 tablet at Bedtime for Cholesterol 90 tablet 3   STELARA 45 MG/0.5ML SOLN Inject into the skin.     vancomycin (VANCOCIN) 125 MG capsule Take 125 mg by mouth 4 (four) times daily.     No current facility-administered medications on file prior to visit.    Allergies  Allergen Reactions   Remicade [Infliximab]    Fosamax [Alendronate Sodium] Other (See Comments)    GI upset    Singulair [Montelukast Sodium] Other (See Comments)    Makes pt jittery   Wellbutrin [Bupropion] Hives   Clindamycin/Lincomycin Rash   Sulfa Antibiotics Rash    Current Problems (verified) Patient Active Problem List   Diagnosis Date Noted   Sepsis secondary to UTI (HCC) 06/24/2022   Extensor tenosynovitis of right wrist 06/23/2022   Thrombocytopenia (HCC)  06/23/2022   Prediabetes 06/23/2022   Intertrochanteric fracture of right femur, closed, with nonunion, subsequent encounter 01/22/2022   History of ulcerative colitis 07/10/2021   Complete right bundle branch block (RBBB) 05/15/2021   Body mass index (BMI) of 19.0-19.9 in adult 05/12/2021   Bilateral nephrolithiasis 09/21/2020   Senile purpura (HCC) 08/17/2020   Bilateral temporomandibular joint pain 06/15/2020   Spondylolisthesis at L4-L5 level 11/25/2018   Essential hypertension    Hyperlipidemia, mixed    Hypothyroid    GERD    Asthma    DJD (degenerative joint disease)    Vitamin D deficiency    Osteoporosis with current pathological fracture    Allergy     Screening Tests Immunization History  Administered Date(s) Administered   Influenza, High Dose Seasonal PF 07/23/2014, 08/23/2015, 07/10/2016, 08/27/2017, 08/22/2018, 08/25/2019, 08/30/2020, 08/21/2021, 08/21/2022   Moderna SARS-COV2 Booster Vaccination 08/21/2022   PFIZER(Purple Top)SARS-COV-2 Vaccination 12/10/2019, 12/31/2019, 08/16/2020, 04/14/2021   Pneumococcal Conjugate-13 05/05/2015   Pneumococcal-Unspecified 08/22/2011   Tdap 08/22/2011   Zoster Recombinat (Shingrix) 09/13/2017, 01/01/2018   Zoster, Live 06/20/2006     Names of Other Physician/Practitioners you currently use: 1. [facility] here for primary care 2.Eye Exam has appt 09/06/2021 3. Dentist 2021, 08/31/2021   Patient Care Team: Lucky Cowboy, MD as PCP - General (Internal Medicine) Osborn Coho, MD (Inactive) as Consulting Physician (Otolaryngology) Barnett Abu, MD as Consulting Physician (Neurosurgery) Mammography, [place] (Diagnostic Radiology) Malachy Mood, MD as Consulting Physician (Hematology) Pollyann Savoy, MD as Consulting Physician (Rheumatology) Lucky Cowboy, MD as Referring Physician (Internal Medicine) Juanell Fairly, RN as [facility] Care Management Glendale Chard, DO as Consulting Physician (Neurology)  SURGICAL HISTORY She  has a past surgical history that includes Mohs surgery (2005); Tonsillectomy (age 74); Dilation and curettage of uterus; Dilatation & curettage/hysteroscopy with myosure (N/A, 10/27/2014); Back surgery (2020); Hip surgery (Right, 04/2020); Cataract extraction, bilateral (Bilateral, 2020); and Conversion to total hip (Right, 01/22/2022). FAMILY HISTORY Her family history includes Autism in her son; Autism spectrum disorder in her son; Cancer in her father, maternal grandmother, mother, and sister; Colitis in her son; Colon cancer (age of onset: 45) in her brother; Diabetes in her brother and maternal grandfather; Healthy in her son; Hyperlipidemia in  her brother, father, maternal grandfather, maternal grandmother, mother, paternal grandfather, paternal grandmother, and sister; Hypertension in her brother and mother; Stroke in her maternal grandfather. SOCIAL HISTORY She  reports that she quit smoking about 22 years ago. Her smoking use included cigarettes. She has a 3.75 pack-year smoking history. She has never been exposed to tobacco smoke. She has never used smokeless tobacco. She reports that she does not currently use alcohol. She reports that she does not use drugs.    Review of Systems  Constitutional:  Positive for malaise/fatigue and weight loss. Negative for chills, diaphoresis and fever.  HENT:  Negative for congestion, ear discharge, ear pain, nosebleeds, sinus pain, sore throat and tinnitus.   Eyes:  Negative for blurred vision, double vision, photophobia, pain, discharge and redness.  Respiratory:  Negative for cough, hemoptysis, sputum production, shortness of breath, wheezing and stridor.   Cardiovascular:  Negative for chest pain, palpitations, orthopnea, claudication, leg swelling and PND.  Gastrointestinal:  Positive for diarrhea and nausea (on Keflex). Negative for abdominal pain, blood in stool, constipation, heartburn,  melena and vomiting.  Genitourinary:  Negative for dysuria, flank pain, frequency, hematuria and urgency.  Musculoskeletal:  Positive for back pain, joint pain (right hip) and neck pain. Negative for falls and myalgias.       S/P lumbar fusion.  Skin:  Positive for rash. Negative for itching.       Continued senile purpura rash on right shin  Neurological:  Positive for weakness. Negative for dizziness, tingling, tremors, sensory change, speech change, focal weakness, seizures, loss of consciousness and headaches.  Endo/Heme/Allergies:  Negative for environmental allergies and polydipsia. Bruises/bleeds easily.  Psychiatric/Behavioral:  Positive for depression. Negative for hallucinations, memory loss, substance abuse and suicidal ideas. The patient is not nervous/anxious and does not have insomnia.      Objective:     There were no vitals filed for this visit.  There is no height or weight on file to calculate BMI.  General appearance: alert, no distress, WD/WN, female HEENT: normocephalic, sclerae anicteric, TMs pearly, nares patent, no discharge or erythema, pharynx normal Oral cavity: MMM, no lesions Neck: supple, no lymphadenopathy, no thyromegaly, no masses Heart: RRR, normal S1, S2, no murmurs Lungs: CTA bilaterally, no wheezes, rhonchi, or rales Abdomen: +bs, soft, non tender, non distended, no masses, no hepatomegaly, no splenomegaly Musculoskeletal: nontender, no swelling, no obvious deformity Extremities: no edema, no cyanosis, no clubbing Pulses: 2+ symmetric, upper and lower extremities, normal cap refill Neurological: alert, oriented x 3, CN2-12 intact, strength normal upper extremities and lower extremities, sensation normal throughout, DTRs 2+ throughout, no cerebellar signs, gait slow steady with cane  Psychiatric: normal affect, behavior normal, pleasant  Derm: no rashes, concerning lesions; has scattered small ecchymosis to bil upper extremities    Raynelle Dick, NP   03/20/2023

## 2023-03-25 ENCOUNTER — Ambulatory Visit: Payer: Medicare Other | Admitting: Nurse Practitioner

## 2023-03-26 ENCOUNTER — Telehealth: Payer: Self-pay | Admitting: Pharmacist

## 2023-03-26 ENCOUNTER — Encounter: Payer: Self-pay | Admitting: *Deleted

## 2023-03-26 ENCOUNTER — Ambulatory Visit (INDEPENDENT_AMBULATORY_CARE_PROVIDER_SITE_OTHER): Payer: Medicare Other | Admitting: *Deleted

## 2023-03-26 DIAGNOSIS — M8000XG Age-related osteoporosis with current pathological fracture, unspecified site, subsequent encounter for fracture with delayed healing: Secondary | ICD-10-CM

## 2023-03-26 MED ORDER — ROMOSOZUMAB-AQQG 105 MG/1.17ML ~~LOC~~ SOSY
210.0000 mg | PREFILLED_SYRINGE | Freq: Once | SUBCUTANEOUS | Status: AC
  Administered 2023-03-26: 210 mg via SUBCUTANEOUS

## 2023-03-26 NOTE — Progress Notes (Signed)
Patient is here for nurse visit for her 12th Evenity injection. Patient received bilateral [place] arm injections. She tolerated injections well. She will follow up with Dr. Cleophas Dunker.

## 2023-03-26 NOTE — Progress Notes (Signed)
HC focus outreach prep started. Reviewing office visits, specialists visits, hospital visits, adherence, medications, and labs. Pt confirmed phone visit on 03/28/2023 at 4pm.  (12 mins)

## 2023-03-27 ENCOUNTER — Ambulatory Visit: Payer: Medicare Other

## 2023-03-27 VITALS — BP 122/64 | HR 70 | Temp 97.6°F | Ht 63.5 in | Wt 110.0 lb

## 2023-03-27 DIAGNOSIS — R300 Dysuria: Secondary | ICD-10-CM

## 2023-03-27 NOTE — Progress Notes (Unsigned)
3 MONTH FOLLOW UP  Assessment:   Debbie Houston was seen today for follow-up and medicare wellness.  Diagnoses and all orders for this visit:   Essential hypertension - continue medications, DASH diet, exercise and monitor at home. Call if greater than 130/80.  - CBC  Uncomplicated asthma, unspecified asthma severity, unspecified whether persistent Doing well on current regimen, continue with benefit Continue to monitor  Colitis Having worsening episodes of diarrhea Continue to follow with Dr. Jacqulyn Bath  Senile Purpura(HCC) Has followed with Dr Corliss Skains  GERD Continue PPI/H2 blocker, diet discussed  Hypothyroidism, unspecified type Last TSH elevated Will change dosage to 1 tab T-TH-Sat and 1/2 tab the other days -TSH  Osteoarthritis, unspecified osteoarthritis type, unspecified site Doing well, tylenol PRN, Dr. Madelon Lips follows  Osteoporosis with delayed healing of right intertrochanteric fracture of femur Continue Calcium & Vit D Dr. Cleophas Dunker following for prolia, she is to be starting a new injection for bone growth but had to wait until impetigo infection resolved. 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 Jena Gauss Currently using bone stimulator  Complete Right Bundle Branch Block Monitor  Spondylolisthesis at L4-L5 level S/P lumbar fusion.  Doing well Following with Dr Danielle Dess  Mixed hyperlipidemia Continue current regimen Discussed dietary and exercise modifications  - CMP, Lipid panel  Vitamin D deficiency Continue Vit D supplementation  Medication Management      Over 40 minutes of exam, counseling, chart review and critical decision making was performed Future Appointments  Date Time Provider Department Center  03/28/2023 11:30 AM Raynelle Dick, NP [facility] None  04/22/2023  2:30 PM [name], DO [facility] None  04/29/2023 10:30  AM [name], DO [facility] None  06/25/2023  2:00 PM Lucky Cowboy, MD [facility] None  09/12/2023  1:10 PM [name], PA-C [facility] None       Subjective:  Debbie Houston is a 82 y.o. female who presents for Medicare Annual Wellness Visit and 3 month follow up.   She had fall with R hip/fremur fracture in June 2021 on vacation at the beach, had IM nail placed. She has displaced intertrochanteric fracture of right femur,  closed fracture with nonunion The bone did not heal correctly and is currently undergoing treatments with bone stimulator and following with Dr. Jena Gauss- bone stimulator currently, may require an additional surgery in the future  She had a DEXA 12/26/20 that showed left femoral neck with T -2.5. Following with Dr. Cleophas Dunker on prolia. She is also doing eventia once a month.  She has been noticing urinary urgency, denies hematuria and dysuria.  She is worried for UTI.  Pt had abnormal SPEP, was seen by Dr. Mosetta Putt- no signs of multiple myeloma. Continues to follow.  Pt is seeing Dr. Jacqulyn Bath in [facility] for ulcerative colitis and ruling out Crohns. Continues to have episodes of diarrhea - uses Immodium, having multiple episodes daily.   Continues to Have neck pain that radiates into head. Tylenol will help some  Following with Dr. Lorrine Kin.  She continues to have burning and numbness in hands, legs, feet. Gabapentin did not help   BMI is There is no height or weight on file to calculate BMI., she has been working on diet and exercise. Wt Readings from Last 3 Encounters:  03/14/23 110 lb 6.4 oz (50.1 kg)  03/08/23 110 lb (49.9 kg)  02/14/23 112 lb 3.2 oz (50.9 kg)  Her blood pressure has been controlled at home runs 100-130/70-80's, today their BP is    BP Readings from Last 3 Encounters:  03/14/23 (!) 140/80  03/08/23 132/80  02/14/23 (!) 130/90  She does workout. She denies chest pain, shortness of breath, dizziness.   She is on cholesterol medication (simvastatin 20  mg, cholestipol 2 g daily) and denies myalgias. Her cholesterol is at goal. The cholesterol last visit was:   Lab Results  Component Value Date   CHOL 167 12/25/2022   HDL 70 12/25/2022   LDLCALC 75 12/25/2022   TRIG 139 12/25/2022   CHOLHDL 2.4 12/25/2022   . She has been working on diet and exercise. and denies Last A1C in the office was:  Lab Results  Component Value Date   HGBA1C 5.7 (H) 12/25/2022   Last GFR: Lab Results  Component Value Date   GFRNONAA >60 06/24/2022   She is on thyroid medication. Currently taking Levothyroxine 125 mcg 1 tab Tues and Thurs and 1/2 tab the other days Lab Results  Component Value Date   TSH 1.49 12/25/2022   Patient is on Vitamin D supplement.   Lab Results  Component Value Date   VD25OH 59 12/25/2022      Medication Review: Current Outpatient Medications on File Prior to Visit  Medication Sig Dispense Refill   acetaminophen (TYLENOL) 500 MG tablet Take 500 mg by mouth at bedtime as needed for moderate pain or headache.      Ascorbic Acid (VITAMIN C) 1000 MG tablet Take 1,000 mg by mouth daily.      aspirin EC 81 MG tablet Take 1 tablet (81 mg total) by mouth in the morning and at bedtime. (Patient taking differently: Take 81 mg by mouth daily.) 60 tablet 0   bisoprolol (ZEBETA) 5 MG tablet Take 1 tablet every Morning for BP 90 tablet 3   Calcium Carbonate-Vit D-Min (CALCIUM 1200 PO) Take 1,200 mg by mouth daily.     CINNAMON PO Take 2,000 mg by mouth daily.     Coenzyme Q10 (COQ10) 200 MG CAPS Take 200 mg by mouth daily.     colestipol (COLESTID) 1 g tablet Take 4 g by mouth daily. (Patient not taking: Reported on 02/14/2023)     cycloSPORINE (RESTASIS) 0.05 % ophthalmic emulsion Place 1 drop into both eyes 2 (two) times daily.     Fluticasone Propionate (FLONASE NA) Place into the nose.     levothyroxine (SYNTHROID) 125 MCG tablet TAKE 1 TABLET BY MOUTH DAILY ON AN EMPTY STOMACH WITH ONLY WATER FOR 30 MINUTES(NO ANTACIDS MEDICATIONS,  CALCIUM OR MAGNESIUM FOR 4 HOURS) (Patient taking differently: Take 125 mcg by mouth daily before breakfast.) 90 tablet 0   loperamide (IMODIUM) 2 MG capsule Take 4 mg by mouth as needed.     loratadine (CLARITIN) 10 MG tablet Take 10 mg by mouth daily as needed for allergies.     Mesalamine (ASACOL PO) Take by mouth. Take once daily     Multiple Vitamin (THERA VITAMIN PO) Take 1 tablet by mouth daily.     Omega-3 Fatty Acids (FISH OIL) 1200 MG CAPS Take 1,200 mg by mouth daily.      pantoprazole (PROTONIX) 40 MG tablet Take 1 tablet  2 x /day  for Heartburn & Indigestion (Patient taking differently: Take 40 mg by mouth 2 (two) times daily.) 180 tablet 0   Probiotic Product (PROBIOTIC PO) Take 1 capsule by mouth daily.     PROLIA 60 MG/ML SOSY  injection Inject 60 mg into the skin every 6 (six) months.     pseudoephedrine (SUDAFED) 120 MG 12 hr tablet Take  1 tablet  2 x /day (every 12 hours)  for Head and Chest Congestion (Patient taking differently: Take 120 mg by mouth daily as needed for congestion (Head and chest congestion).) 60 tablet 0   simvastatin (ZOCOR) 20 MG tablet Take 1 tablet at Bedtime for Cholesterol 90 tablet 3   STELARA 45 MG/0.5ML SOLN Inject into the skin.     vancomycin (VANCOCIN) 125 MG capsule Take 125 mg by mouth 4 (four) times daily.     No current facility-administered medications on file prior to visit.    Allergies  Allergen Reactions   Remicade [Infliximab]    Fosamax [Alendronate Sodium] Other (See Comments)    GI upset    Singulair [Montelukast Sodium] Other (See Comments)    Makes pt jittery   Wellbutrin [Bupropion] Hives   Clindamycin/Lincomycin Rash   Sulfa Antibiotics Rash    Current Problems (verified) Patient Active Problem List   Diagnosis Date Noted   Sepsis secondary to UTI (HCC) 06/24/2022   Extensor tenosynovitis of right wrist 06/23/2022   Thrombocytopenia (HCC) 06/23/2022   Prediabetes 06/23/2022   Intertrochanteric fracture of right  femur, closed, with nonunion, subsequent encounter 01/22/2022   History of ulcerative colitis 07/10/2021   Complete right bundle branch block (RBBB) 05/15/2021   Body mass index (BMI) of 19.0-19.9 in adult 05/12/2021   Bilateral nephrolithiasis 09/21/2020   Senile purpura (HCC) 08/17/2020   Bilateral temporomandibular joint pain 06/15/2020   Spondylolisthesis at L4-L5 level 11/25/2018   Essential hypertension    Hyperlipidemia, mixed    Hypothyroid    GERD    Asthma    DJD (degenerative joint disease)    Vitamin D deficiency    Osteoporosis with current pathological fracture    Allergy     Names of Other Physician/Practitioners you currently use: 1. [facility] here for primary care 2.Eye Exam 07/2022 3. Dentist 07/2022  Patient Care Team: Lucky Cowboy, MD as PCP - General (Internal Medicine) Osborn Coho, MD (Inactive) as Consulting Physician (Otolaryngology) Barnett Abu, MD as Consulting Physician (Neurosurgery) Mammography, [place] (Diagnostic Radiology) Malachy Mood, MD as Consulting Physician (Hematology) Pollyann Savoy, MD as Consulting Physician (Rheumatology) Lucky Cowboy, MD as Referring Physician (Internal Medicine) Juanell Fairly, RN as [facility] Care Management Glendale Chard, DO as Consulting Physician (Neurology)  SURGICAL HISTORY She  has a past surgical history that includes Mohs surgery (2005); Tonsillectomy (age 62); Dilation and curettage of uterus; Dilatation & curettage/hysteroscopy with myosure (N/A, 10/27/2014); Back surgery (2020); Hip surgery (Right, 04/2020); Cataract extraction, bilateral (Bilateral, 2020); and Conversion to total hip (Right, 01/22/2022). FAMILY HISTORY Her family history includes Autism in her son; Autism spectrum disorder in her son; Cancer in her father, maternal grandmother, mother, and sister; Colitis in her son; Colon cancer (age of onset: 59) in her brother; Diabetes in  her brother and maternal grandfather; Healthy in her son; Hyperlipidemia in her brother, father, maternal grandfather, maternal grandmother, mother, paternal grandfather, paternal grandmother, and sister; Hypertension in her brother and mother; Stroke in her maternal grandfather. SOCIAL HISTORY She  reports that she quit smoking about 22 years ago. Her smoking use included cigarettes. She has a 3.75 pack-year smoking history. She has never been exposed to tobacco smoke. She has never used smokeless tobacco. She reports that she does not currently use alcohol. She reports that she  does not use drugs.    Review of Systems  Constitutional:  Positive for malaise/fatigue and weight loss. Negative for chills, diaphoresis and fever.  HENT:  Negative for congestion, ear discharge, ear pain, nosebleeds, sinus pain, sore throat and tinnitus.   Eyes:  Negative for blurred vision, double vision, photophobia, pain, discharge and redness.  Respiratory:  Negative for cough, hemoptysis, sputum production, shortness of breath, wheezing and stridor.   Cardiovascular:  Negative for chest pain, palpitations, orthopnea, claudication, leg swelling and PND.  Gastrointestinal:  Positive for diarrhea. Negative for abdominal pain, blood in stool, constipation, heartburn, melena, nausea and vomiting.  Genitourinary:  Positive for urgency. Negative for dysuria, flank pain, frequency and hematuria.  Musculoskeletal:  Positive for back pain, joint pain (hands) and neck pain. Negative for falls and myalgias.       S/P lumbar fusion.  Skin:  Positive for rash. Negative for itching.       Continued senile purpura rash on right shin  Neurological:  Positive for weakness. Negative for dizziness, tingling, tremors, sensory change, speech change, focal weakness, seizures, loss of consciousness and headaches.  Endo/Heme/Allergies:  Negative for environmental allergies and polydipsia. Bruises/bleeds easily.  Psychiatric/Behavioral:   Positive for depression. Negative for hallucinations, memory loss, substance abuse and suicidal ideas. The patient is not nervous/anxious and does not have insomnia.      Objective:     There were no vitals filed for this visit.  There is no height or weight on file to calculate BMI.  General appearance: thin pleasant female alert, no distress HEENT: normocephalic, sclerae anicteric, TMs pearly, nares patent, no discharge or erythema, pharynx normal Oral cavity: MMM, no lesions Neck: supple, no lymphadenopathy, no thyromegaly, no masses Heart: RRR, normal S1, S2, no murmurs Lungs: CTA bilaterally, no wheezes, rhonchi, or rales Abdomen: +bs, soft, non tender, non distended, no masses, no hepatomegaly, no splenomegaly Musculoskeletal: nontender, no swelling, no obvious deformity Extremities: no edema, no cyanosis, no clubbing Pulses: 2+ symmetric, upper and lower extremities, normal cap refill Neurological: alert, oriented x 3, CN2-12 intact, strength normal upper extremities and lower extremities, sensation normal throughout, DTRs 2+ throughout, no cerebellar signs, gait slow steady with cane  Psychiatric: normal affect, behavior normal, pleasant  Derm: no rashes, concerning lesions; has scattered small ecchymosis to bil upper extremities     Raynelle Dick, NP   03/27/2023

## 2023-03-28 ENCOUNTER — Telehealth: Payer: Self-pay | Admitting: *Deleted

## 2023-03-28 ENCOUNTER — Encounter: Payer: Self-pay | Admitting: Nurse Practitioner

## 2023-03-28 ENCOUNTER — Ambulatory Visit (INDEPENDENT_AMBULATORY_CARE_PROVIDER_SITE_OTHER): Payer: Medicare Other | Admitting: Nurse Practitioner

## 2023-03-28 VITALS — BP 122/64 | HR 70 | Temp 97.7°F | Ht 63.5 in | Wt 110.2 lb

## 2023-03-28 DIAGNOSIS — R300 Dysuria: Secondary | ICD-10-CM

## 2023-03-28 DIAGNOSIS — M159 Polyosteoarthritis, unspecified: Secondary | ICD-10-CM

## 2023-03-28 DIAGNOSIS — R7309 Other abnormal glucose: Secondary | ICD-10-CM

## 2023-03-28 DIAGNOSIS — I4510 Unspecified right bundle-branch block: Secondary | ICD-10-CM | POA: Diagnosis not present

## 2023-03-28 DIAGNOSIS — Z79899 Other long term (current) drug therapy: Secondary | ICD-10-CM

## 2023-03-28 DIAGNOSIS — M4316 Spondylolisthesis, lumbar region: Secondary | ICD-10-CM

## 2023-03-28 DIAGNOSIS — K52831 Collagenous colitis: Secondary | ICD-10-CM | POA: Diagnosis not present

## 2023-03-28 DIAGNOSIS — E559 Vitamin D deficiency, unspecified: Secondary | ICD-10-CM | POA: Diagnosis not present

## 2023-03-28 DIAGNOSIS — D692 Other nonthrombocytopenic purpura: Secondary | ICD-10-CM

## 2023-03-28 DIAGNOSIS — I10 Essential (primary) hypertension: Secondary | ICD-10-CM | POA: Diagnosis not present

## 2023-03-28 DIAGNOSIS — E782 Mixed hyperlipidemia: Secondary | ICD-10-CM

## 2023-03-28 DIAGNOSIS — J45909 Unspecified asthma, uncomplicated: Secondary | ICD-10-CM

## 2023-03-28 DIAGNOSIS — E039 Hypothyroidism, unspecified: Secondary | ICD-10-CM | POA: Diagnosis not present

## 2023-03-28 LAB — URINALYSIS, ROUTINE W REFLEX MICROSCOPIC
Bilirubin Urine: NEGATIVE
Protein, ur: NEGATIVE
RBC / HPF: NONE SEEN /HPF (ref 0–2)
Specific Gravity, Urine: 1.009 (ref 1.001–1.035)

## 2023-03-28 MED ORDER — NITROFURANTOIN MONOHYD MACRO 100 MG PO CAPS
100.0000 mg | ORAL_CAPSULE | Freq: Two times a day (BID) | ORAL | 0 refills | Status: AC
Start: 2023-03-28 — End: 2023-04-04

## 2023-03-28 MED ORDER — MELOXICAM 7.5 MG PO TABS
7.5000 mg | ORAL_TABLET | Freq: Every day | ORAL | 2 refills | Status: DC
Start: 2023-03-28 — End: 2023-04-29

## 2023-03-28 NOTE — Telephone Encounter (Addendum)
CRN called and spoke with patient for scheduled follow up.   1. HTN: Blood pressures have been stable at home. She will continue home monitoring. BP ranges between 100-130s/70-80s.   2: UTI: Completed annual wellness visit today. She states it is suspected that she has a UTI and was placed on Macrobid 100mg  twice daily x 7 days. She has picked this medication up from the pharmacy and knows to complete full course even if she starts to feel better.  3: Diarrhea: Continues with some diarrhea from her ulcerative colitis, but says it is not as bad. She just finished a course of antibiotics (Vancomycin) last week for c-diff.   4: Pain: Reports pain in head and neck d/t some pinched nerves. She is being followed by Ortho for this.  Next outreach: 2 months  CRN Phone call: 5 min CRN Documentation 3 min

## 2023-03-28 NOTE — Patient Instructions (Signed)
Arthritis Arthritis means joint pain. It can also mean joint disease. A joint is a place where bones come together. There are more than 100 types of arthritis. What are the causes? Wear and tear of a joint. This is the most common cause. Too much of a chemical called uric acid in the blood, which leads to pain in the joint (gout). Pain and swelling (inflammation) in a joint. Infection of a joint. Injuries in the joint. A reaction to medicines (allergy). In some cases, the cause may not be known. What are the signs or symptoms? Pain in a joint when moving. Redness at a joint. Swelling at a joint. Stiffness at a joint. Warmth coming from the joint. A fever. A feeling of being sick. How is this treated? This condition may be treated with: Treating the cause, if it is known. Rest. Raising (elevating) the joint. Putting cold or hot packs on the joint. Medicines to treat symptoms and reduce pain and swelling. Shots (injections) of medicines, such as cortisone, into the joint. You may also be told to make changes in your life, such as doing exercises and losing weight. Follow these instructions at home: Medicines Take over-the-counter and prescription medicines only as told by your doctor. Do not take aspirin for pain if your doctor says that you may have gout. Activity Rest your joint if your doctor tells you to. Avoid activities that make the pain worse. Exercise your joint regularly as told by your doctor. Try doing exercises like: Swimming. Water aerobics. Biking. Walking. Managing pain, stiffness, and swelling     If told, put ice on the affected area. To do this: Put ice in a plastic bag. Place a towel between your skin and the bag. Leave the ice on for 20 minutes, 2-3 times a day. Take off the ice if your skin turns bright red. This is very important. If you cannot feel pain, heat, or cold, you have a greater risk of damage to the area. If your joint is swollen, raise  (elevate) it above the level of your heart if told by your doctor. If your joint feels stiff in the morning, try taking a warm shower. If told, put heat on the affected area. Do this as often as told by your doctor. Use the heat source that your doctor recommends, such as a moist heat pack or a heating pad. If you have diabetes, do not apply heat without asking your doctor. To apply heat: Place a towel between your skin and the heat source. Leave the heat on for 20-30 minutes. Take off the heat if your skin turns bright red. This is very important. If you cannot feel pain, heat, or cold, you have a greater risk of getting burned. General instructions Maintain a healthy weight. Follow instructions from your doctor for weight control. Do not smoke or use any products that contain nicotine or tobacco. If you need help quitting, ask your doctor. Keep all follow-up visits. Where to find more information National Institutes of Health: www.niams.nih.gov Contact a doctor if: The pain gets worse. You have a fever. Get help right away if: You have very bad pain in your joint. You have swelling in your joint. Your joint is red. Many joints become painful and swollen. You have very bad back pain. Your leg is very weak. Summary Arthritis means joint pain. It can also mean joint disease. A joint is a place where bones come together. The most common cause of this condition is wear and   tear of a joint. Symptoms of this condition include redness, swelling, or stiffness of the joint. This condition is treated with rest, raising the joint, medicines, and putting cold or hot packs on the joint. Follow your doctor's instructions about medicines, activity, exercises, and other home care treatments. This information is not intended to replace advice given to you by your health care provider. Make sure you discuss any questions you have with your health care provider. Document Revised: 08/15/2021 Document  Reviewed: 08/15/2021 Elsevier Patient Education  2023 Elsevier Inc.  

## 2023-03-29 LAB — COMPLETE METABOLIC PANEL WITH GFR
AG Ratio: 2.1 (calc) (ref 1.0–2.5)
ALT: 14 U/L (ref 6–29)
AST: 18 U/L (ref 10–35)
Albumin: 4.2 g/dL (ref 3.6–5.1)
Alkaline phosphatase (APISO): 60 U/L (ref 37–153)
BUN: 11 mg/dL (ref 7–25)
CO2: 25 mmol/L (ref 20–32)
Calcium: 9.2 mg/dL (ref 8.6–10.4)
Chloride: 100 mmol/L (ref 98–110)
Creat: 0.76 mg/dL (ref 0.60–0.95)
Globulin: 2 g/dL (calc) (ref 1.9–3.7)
Glucose, Bld: 91 mg/dL (ref 65–99)
Potassium: 4.4 mmol/L (ref 3.5–5.3)
Sodium: 133 mmol/L — ABNORMAL LOW (ref 135–146)
Total Bilirubin: 0.4 mg/dL (ref 0.2–1.2)
Total Protein: 6.2 g/dL (ref 6.1–8.1)
eGFR: 79 mL/min/{1.73_m2} (ref >=60)

## 2023-03-29 LAB — LIPID PANEL
Cholesterol: 169 mg/dL (ref <200)
HDL: 59 mg/dL (ref >=50)
LDL Cholesterol (Calc): 92 mg/dL (calc)
Non-HDL Cholesterol (Calc): 110 mg/dL (calc) (ref <130)
Total CHOL/HDL Ratio: 2.9 (calc) (ref <5.0)
Triglycerides: 87 mg/dL (ref <150)

## 2023-03-29 LAB — CBC WITH DIFFERENTIAL/PLATELET
Absolute Monocytes: 592 cells/uL (ref 200–950)
Basophils Absolute: 52 cells/uL (ref 0–200)
Basophils Relative: 0.8 %
Eosinophils Absolute: 98 cells/uL (ref 15–500)
Eosinophils Relative: 1.5 %
HCT: 37.4 % (ref 35.0–45.0)
Hemoglobin: 12.6 g/dL (ref 11.7–15.5)
Lymphs Abs: 1086 cells/uL (ref 850–3900)
MCH: 32.2 pg (ref 27.0–33.0)
MCHC: 33.7 g/dL (ref 32.0–36.0)
MCV: 95.7 fL (ref 80.0–100.0)
MPV: 10.6 fL (ref 7.5–12.5)
Monocytes Relative: 9.1 %
Neutro Abs: 4674 cells/uL (ref 1500–7800)
Neutrophils Relative %: 71.9 %
Platelets: 243 10*3/uL (ref 140–400)
RBC: 3.91 10*6/uL (ref 3.80–5.10)
RDW: 12.7 % (ref 11.0–15.0)
Total Lymphocyte: 16.7 %
WBC: 6.5 10*3/uL (ref 3.8–10.8)

## 2023-03-29 LAB — TSH: TSH: 0.82 mIU/L (ref 0.40–4.50)

## 2023-03-29 NOTE — Telephone Encounter (Signed)
Evenity injection series complete.  Would you like to transition pt to Prolia?

## 2023-03-29 NOTE — Telephone Encounter (Signed)
Debbie Houston, patient has stated she will follow up with Dr. Cleophas Dunker for her next steps regarding her osteoporosis treatment. Thank you!

## 2023-03-30 LAB — URINE CULTURE
MICRO NUMBER:: 14931503
SPECIMEN QUALITY:: ADEQUATE

## 2023-03-30 LAB — MICROSCOPIC MESSAGE

## 2023-03-30 LAB — URINALYSIS, ROUTINE W REFLEX MICROSCOPIC
Glucose, UA: NEGATIVE
Hgb urine dipstick: NEGATIVE
Hyaline Cast: NONE SEEN /LPF
Ketones, ur: NEGATIVE
Nitrite: NEGATIVE
Squamous Epithelial / HPF: NONE SEEN /HPF (ref <=5)
pH: 6.5 (ref 5.0–8.0)

## 2023-04-19 DIAGNOSIS — E039 Hypothyroidism, unspecified: Secondary | ICD-10-CM

## 2023-04-19 DIAGNOSIS — I10 Essential (primary) hypertension: Secondary | ICD-10-CM

## 2023-04-19 DIAGNOSIS — K2100 Gastro-esophageal reflux disease with esophagitis, without bleeding: Secondary | ICD-10-CM

## 2023-04-19 DIAGNOSIS — E782 Mixed hyperlipidemia: Secondary | ICD-10-CM

## 2023-04-19 NOTE — Telephone Encounter (Signed)
CARE PLAN  Printed on: 04/19/2023 Houston,Debbie  __________________________________________________  Ongoing Problems Problem: CP: 1. Actions for Patient  Goal 1: Review and act on the below actions as discussed with your clinical pharmacist Priority: High  Achieve by: 04/19/2023  Long-term   Care Team Interventions:   1. Action 1: Continue home BP monitoring and record in a log for review Done on: 04/19/2023  2. Action 2:  Contact PCP office or CCM team if UTI symptoms re-occur Done on: 04/19/2023  3. Action 3: Continue all medications as prescribed. Done on: 04/19/2023  4. Next CRN outreach: 2 months Done on: 04/19/2023 Problem: CP: 2. Chronic Care Management (CCM) Team  Goal 1: Contact your clinical pharmacist and care team with any questions or concerns Priority: Medium  Achieve by: 04/19/2023  Long-term   Care Team Interventions:   1. Team Phone #: [phone] Done on: 04/19/2023  2. Clinical Pharmacist: Yolande Jolly Done on: 04/19/2023  3. Coordinating Registered Nurse: Candiss Norse Done on: 04/19/2023  4. Health Concierge: Maryellen Pile Done on: 04/19/2023 Problem: CP: Hypertension (HTN)  Goal 1: Maintain Blood Pressure less than 130/80.      Your current BP is 122/64  (03/28/23) Priority: Medium  Achieve by: 04/19/2023  Long-term   Care Team Interventions:   1. Recommend following the DASH diet, which emphasizes fruits and vegetables and low-fat dairy products along with whole grains, fish, poultry, and nuts. Reduce red meats and sugars. Done on: 04/19/2023  2. Recommended Patient continue checking home BP readings 3 times per week Done on: 04/19/2023  3. Educated patient to call PCP office if you develop episodes of low blood pressure, dizziness, falls or increased headaches, and/or swelling of legs and feet. Done on: 04/19/2023  4. Continue taking Bisoprolol as prescribed Done on: 04/19/2023 Problem: CP: Osteopenia or Osteoporosis  Goal  1: Prevent fractures and disabilities related to osteoporosis Priority: High  Achieve by: 04/19/2023  Long-term   Care Team Interventions:   1. Educated Patient on home safety and fall prevention. To avoid falls, recommend removing any loose throw rugs from walkways and moving any objects obstructing pathways in the home. Ensure there is adequate lighting, especially if moving around the house at night. Contact your primary care provider or pharmacist if any of the medications you take make you feel dizzy or too drowsy. Done on: 04/19/2023  2. Recommend doing weight bearing exercises, such as walking, light weights, and/or resistance training as tolerated. Done on: 04/19/2023  3. Continue taking Calcium Carbonate-Vit D and Prolia as prescribed. Done on: 04/19/2023 Problem: CP: Preventative health care screenings  Goal 1: Patient will have all recommended preventative health care screening(s) Priority: Medium  Achieve by: 04/19/2023   Care Team Interventions:   1. Annual Wellness Visit already completed on 03/28/23  04/19/23 Care plan revision: 13 min

## 2023-04-22 ENCOUNTER — Encounter: Payer: Self-pay | Admitting: Neurology

## 2023-04-22 ENCOUNTER — Ambulatory Visit (INDEPENDENT_AMBULATORY_CARE_PROVIDER_SITE_OTHER): Payer: Medicare Other | Admitting: Neurology

## 2023-04-22 ENCOUNTER — Telehealth: Payer: Self-pay

## 2023-04-22 VITALS — BP 116/69 | HR 76 | Ht 63.5 in | Wt 112.0 lb

## 2023-04-22 DIAGNOSIS — G610 Guillain-Barre syndrome: Secondary | ICD-10-CM

## 2023-04-22 DIAGNOSIS — M542 Cervicalgia: Secondary | ICD-10-CM | POA: Diagnosis not present

## 2023-04-22 DIAGNOSIS — G629 Polyneuropathy, unspecified: Secondary | ICD-10-CM

## 2023-04-22 DIAGNOSIS — D696 Thrombocytopenia, unspecified: Secondary | ICD-10-CM

## 2023-04-22 DIAGNOSIS — M26623 Arthralgia of bilateral temporomandibular joint: Secondary | ICD-10-CM

## 2023-04-22 MED ORDER — DULOXETINE HCL 30 MG PO CPEP
30.0000 mg | ORAL_CAPSULE | Freq: Two times a day (BID) | ORAL | 3 refills | Status: DC
Start: 2023-04-22 — End: 2023-07-31

## 2023-04-22 NOTE — Patient Instructions (Signed)
Start Cymbalta 30mg  twice daily  We will refer you for neck physical therapy   Return to clinic 6 months

## 2023-04-22 NOTE — Progress Notes (Signed)
Follow-up Visit   Date: 04/22/2023    [name] MRN: [id number] DOB: 24-Mar-1941    Debbie Houston is a 82 y.o. left-handed Caucasian female with ulcerative colitis, hyperlipidemia, GERD, asthma, hypothyroidism, OA, cervical spondylosis, lumbar spondylolisthesis at L4-5 (followed by Dr. Danielle Dess)  returning to the clinic for follow-up of bilateral hand and feet pain/paresthesias.  The patient was accompanied to the clinic by self.  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 CSF testing for polyradiculoneuropathy returned normal.  She continues to have predominately numbness in the hands.  If symptoms get worse, consider trial of solumedrol.  For now, I will restart Cymbalta 30mg  BID.  For neck pain, start out-patient neck PT.  She was seeing neurosurgery who offered ESI which she will consider if symptoms get worse.  Return to clinic in 6 months   --------------------------------------------- History of present illness: She received Remicade infusion for Crohn's colitis in July and following this developed UTI.  She was hospitalized in August for urosepsis.  While hospitalized, she began having numbness, tingling, and sharp shooting pain over the hands and feet.  Numbness is constant and she has episodic sharp pain.  She tried gabapentin 200mg  but stopped this due to dizziness.   She complains of weakness with difficulty with opening jars/grip.  She has some imbalance, walks unassisted and does not use a cane. Over the past three months, symptoms remain unchanged.  She denies having any numbness/tingling prior to her hospitalization.    She lives at home with husband and high-functioning autistic son.   UPDATE 10/31/2022:  She is here for follow-up and discuss results of EMG and labs.  There has been no change in the numbness of her hands. She continues to have difficulty  with holding onto objects and frequently drops them.  She also endorse imbalance and had one fall which results in bruise over her right eye.  Tingling has improved slightly on Cymbalta 30mg  and she would like to consider increasing the dose. No new complaints.    UPDATE 12/19/2022:  She is here for follow-up. CSF testing returned normal without signs of inflammation.  She continues have numbness of the hands and reports droping things frequently.   She denies significant tingling or burning of the hands.  She is tolerating Cymbalta 30mg  twice daily.  Over the past few weeks, her colitis has been giving her more problems and she is currently on a steroid taper.   UPDATE 04/22/2023:  She is here for follow-up visit.  She reports having ongoing numbness in the hands and has difficulty with fine motor tasks, such as putting on her hearing aids.  She denies any pain.  She was started on Stelara for UC by gastroenterology.  She has not noticed any significant change to her neuropathy.   Medications:  Current Outpatient Medications on File Prior to Visit  Medication Sig Dispense Refill   acetaminophen (TYLENOL) 500 MG tablet Take 500 mg by mouth at bedtime as needed for moderate pain or headache.      Ascorbic Acid (VITAMIN C) 1000 MG tablet Take 1,000 mg by mouth daily.      aspirin EC 81 MG tablet Take 1 tablet (81 mg total) by mouth in the morning and at bedtime. (Patient taking differently: Take 81 mg by mouth daily.) 60 tablet 0   bisoprolol (ZEBETA) 5 MG tablet Take 1 tablet every Morning for BP 90 tablet  3   Calcium Carbonate-Vit D-Min (CALCIUM 1200 PO) Take 1,200 mg by mouth daily.     CINNAMON PO Take 2,000 mg by mouth daily.     Coenzyme Q10 (COQ10) 200 MG CAPS Take 200 mg by mouth daily.     cycloSPORINE (RESTASIS) 0.05 % ophthalmic emulsion Place 1 drop into both eyes 2 (two) times daily.     Fluticasone Propionate (FLONASE NA) Place into the nose.     levothyroxine (SYNTHROID) 125 MCG tablet  TAKE 1 TABLET BY MOUTH DAILY ON AN EMPTY STOMACH WITH ONLY WATER FOR 30 MINUTES(NO ANTACIDS MEDICATIONS, CALCIUM OR MAGNESIUM FOR 4 HOURS) (Patient taking differently: Take 125 mcg by mouth daily before breakfast.) 90 tablet 0   loperamide (IMODIUM) 2 MG capsule Take 4 mg by mouth as needed.     loratadine (CLARITIN) 10 MG tablet Take 10 mg by mouth daily as needed for allergies.     meloxicam (MOBIC) 7.5 MG tablet Take 1 tablet (7.5 mg total) by mouth daily. 30 tablet 2   Mesalamine (ASACOL PO) Take by mouth. Take once daily     Multiple Vitamin (THERA VITAMIN PO) Take 1 tablet by mouth daily.     Omega-3 Fatty Acids (FISH OIL) 1200 MG CAPS Take 1,200 mg by mouth daily.      pantoprazole (PROTONIX) 40 MG tablet Take 1 tablet  2 x /day  for Heartburn & Indigestion (Patient taking differently: Take 40 mg by mouth 2 (two) times daily.) 180 tablet 0   Probiotic Product (PROBIOTIC PO) Take 1 capsule by mouth daily.     PROLIA 60 MG/ML SOSY injection Inject 60 mg into the skin every 6 (six) months.     pseudoephedrine (SUDAFED) 120 MG 12 hr tablet Take  1 tablet  2 x /day (every 12 hours)  for Head and Chest Congestion (Patient taking differently: Take 120 mg by mouth daily as needed for congestion (Head and chest congestion).) 60 tablet 0   simvastatin (ZOCOR) 20 MG tablet Take 1 tablet at Bedtime for Cholesterol 90 tablet 3   STELARA 45 MG/0.5ML SOLN Inject into the skin.     No current facility-administered medications on file prior to visit.    Allergies:  Allergies  Allergen Reactions   Remicade [Infliximab]    Fosamax [Alendronate Sodium] Other (See Comments)    GI upset    Singulair [Montelukast Sodium] Other (See Comments)    Makes pt jittery   Wellbutrin [Bupropion] Hives   Clindamycin/Lincomycin Rash   Sulfa Antibiotics Rash    Vital Signs:  BP 116/69   Pulse 76   Ht 5' 3.5" (1.613 m)   Wt 112 lb (50.8 kg)   SpO2 98%   BMI 19.53 kg/m   Neurological Exam: MENTAL STATUS  including orientation to time, place, person, recent and remote memory, attention span and concentration, language, and fund of knowledge is normal.  Speech is not dysarthric.  CRANIAL NERVES:   Pupils equal round and reactive to light.  Normal conjugate, extra-ocular eye movements in all directions of gaze.  No ptosis .  Face is symmetric.   MOTOR:  Motor strength is 5/5 in all extremities, except 4/5 intrinsic hand muscles.  Generalized loss of muscle bulk with bilateral ABP atrophy, fasciculations or abnormal movements.  No pronator drift.  Tone is normal.    MSRs:  Reflexes are 2+/4 throughout, except absent at the ankles.  SENSORY:  Intact to vibration throughout, except trace at the ankles bilaterally.  COORDINATION/GAIT:  Gait wide based and slightly unstable, unassisted  Data: Labs 10/22/2022:  CRP < 1.0, ESR 19, folate > 23, copper 108, SPEP with IFE poorly defined IgG and kappa  Lab Results  Component Value Date   VITAMINB12 681 07/10/2022    NCS/EMG of the arms 10/09/2022: This is a complex study.  Findings are most suggestive of a non-length dependent and chronic sensorimotor polyneuropathy, with axonal and demyelinating features. Alternatively, a polyneuropathy with superimposed median and ulnar entrapment neuropathies in the upper extremity is possible.  Nerve ultrasound recommended to further evaluate. Chronic L5 radiculopathy affecting the right lower extremity, mild.  CSF 11/23/2022:  R2 W0 G74 P46  OCB absent, MBP < 2.0, Lyme neg, ACE neg, cytology neg   Thank you for allowing me to participate in patient's care.  If I can answer any additional questions, I would be pleased to do so.    Sincerely,    Brandonn Capelli K. Allena Katz, DO

## 2023-04-22 NOTE — Telephone Encounter (Signed)
Does she have any vision changes or headaches?  If so, we can check labs for sedimentation rate and CRP.  If not, then have her continue to monitor and if it gets worse she can return for evaluation.

## 2023-04-22 NOTE — Telephone Encounter (Signed)
When discharging patient today she forgot to mention to Dr. Allena Katz that she has a pain in her right temple that comes and goes. She wanted to let Dr. Allena Katz know incase it was something she should be concerned about. She stated she is not sure if it might be related to TMJ.

## 2023-04-23 DIAGNOSIS — H43393 Other vitreous opacities, bilateral: Secondary | ICD-10-CM | POA: Diagnosis not present

## 2023-04-23 DIAGNOSIS — H52223 Regular astigmatism, bilateral: Secondary | ICD-10-CM | POA: Diagnosis not present

## 2023-04-23 DIAGNOSIS — H5203 Hypermetropia, bilateral: Secondary | ICD-10-CM | POA: Diagnosis not present

## 2023-04-23 DIAGNOSIS — H524 Presbyopia: Secondary | ICD-10-CM | POA: Diagnosis not present

## 2023-04-23 NOTE — Telephone Encounter (Signed)
Called patient and asked her is she has any vision changes or headaches? Patient stated sometimes she does. I informed patient that Dr. Allena Katz would like to get some labs checked on her Sedimentation rate and CRP. Patient stated she is getting ready to head out of town for a few days but will come in next week to have labs done. Patient is aware to call our office before she comes in to ensure that the lab is open.

## 2023-04-29 ENCOUNTER — Encounter: Payer: Self-pay | Admitting: Cardiology

## 2023-04-29 ENCOUNTER — Ambulatory Visit: Payer: Medicare Other | Admitting: Cardiology

## 2023-04-29 VITALS — BP 137/75 | HR 62 | Resp 16 | Ht 63.0 in | Wt 111.8 lb

## 2023-04-29 DIAGNOSIS — I4510 Unspecified right bundle-branch block: Secondary | ICD-10-CM | POA: Diagnosis not present

## 2023-04-29 DIAGNOSIS — I10 Essential (primary) hypertension: Secondary | ICD-10-CM

## 2023-04-29 DIAGNOSIS — E782 Mixed hyperlipidemia: Secondary | ICD-10-CM | POA: Diagnosis not present

## 2023-04-29 DIAGNOSIS — Z87891 Personal history of nicotine dependence: Secondary | ICD-10-CM

## 2023-04-29 NOTE — Progress Notes (Signed)
Date:  04/29/2023   ID:  Debbie Houston, DOB March 06, 1941, MRN [id number]  PCP:  Debbie Cowboy, MD  Cardiologist:  Debbie Lerner, DO, [facility]  (established care 10/25/2021)  Date: 04/29/23 Last Office Visit: 04/27/2022  Chief Complaint  Patient presents with   Hypertension   Follow-up    1 year    HPI  Debbie Houston is a 82 y.o. Caucasian female whose past medical history and cardiovascular risk factors include:hypertension, right bundle branch block, hyperlipidemia, GERD, neuropathy, osteoporosis, former smoker, post menopausal, advance age.   Initially referred to the practice for preoperative restratification.  She underwent echo and stress test in the past which were within acceptable limits.  At the last office visit recommended he do annual follow-up visit or on as needed basis.  However, given her age and risk factors patient preferred annual follow-up visits.  She presents for 1 year visit.  She denies anginal chest pain or heart failure symptoms.  No hospitalizations or urgent care visits for cardiovascular reasons.  Outside labs from May 2024 independently reviewed and noted below for further reference.  ALLERGIES: Allergies  Allergen Reactions   Remicade [Infliximab]    Fosamax [Alendronate Sodium] Other (See Comments)    GI upset    Singulair [Montelukast Sodium] Other (See Comments)    Makes pt jittery   Wellbutrin [Bupropion] Hives   Clindamycin/Lincomycin Rash   Sulfa Antibiotics Rash    MEDICATION LIST PRIOR TO VISIT: Current Meds  Medication Sig   acetaminophen (TYLENOL) 500 MG tablet Take 500 mg by mouth at bedtime as needed for moderate pain or headache.    Ascorbic Acid (VITAMIN C) 1000 MG tablet Take 1,000 mg by mouth daily.    aspirin EC 81 MG tablet Take 1 tablet (81 mg total) by mouth in the morning and at bedtime. (Patient taking differently: Take 81 mg by mouth daily.)   bisoprolol (ZEBETA) 5 MG tablet Take 1 tablet every Morning for BP   Calcium  Carbonate-Vit D-Min (CALCIUM 1200 PO) Take 1,200 mg by mouth daily.   CINNAMON PO Take 2,000 mg by mouth daily.   Coenzyme Q10 (COQ10) 200 MG CAPS Take 200 mg by mouth daily.   cycloSPORINE (RESTASIS) 0.05 % ophthalmic emulsion Place 1 drop into both eyes 2 (two) times daily.   DULoxetine (CYMBALTA) 30 MG capsule Take 1 capsule (30 mg total) by mouth 2 (two) times daily.   Fluticasone Propionate (FLONASE NA) Place into the nose.   levothyroxine (SYNTHROID) 125 MCG tablet TAKE 1 TABLET BY MOUTH DAILY ON AN EMPTY STOMACH WITH ONLY WATER FOR 30 MINUTES(NO ANTACIDS MEDICATIONS, CALCIUM OR MAGNESIUM FOR 4 HOURS) (Patient taking differently: Take 125 mcg by mouth daily before breakfast.)   loperamide (IMODIUM) 2 MG capsule Take 4 mg by mouth as needed.   loratadine (CLARITIN) 10 MG tablet Take 10 mg by mouth daily as needed for allergies.   Mesalamine (ASACOL PO) Take by mouth. Take once daily   Multiple Vitamin (THERA VITAMIN PO) Take 1 tablet by mouth daily.   Omega-3 Fatty Acids (FISH OIL) 1200 MG CAPS Take 1,200 mg by mouth daily.    pantoprazole (PROTONIX) 40 MG tablet Take 1 tablet  2 x /day  for Heartburn & Indigestion (Patient taking differently: Take 40 mg by mouth 2 (two) times daily.)   Probiotic Product (PROBIOTIC PO) Take 1 capsule by mouth daily.   PROLIA 60 MG/ML SOSY injection Inject 60 mg into the skin every 6 (six) months.  pseudoephedrine (SUDAFED) 120 MG 12 hr tablet Take  1 tablet  2 x /day (every 12 hours)  for Head and Chest Congestion (Patient taking differently: Take 120 mg by mouth daily as needed for congestion (Head and chest congestion).)   simvastatin (ZOCOR) 20 MG tablet Take 1 tablet at Bedtime for Cholesterol   STELARA 45 MG/0.5ML SOLN Inject into the skin.     PAST MEDICAL HISTORY: Past Medical History:  Diagnosis Date   Arthritis    Benign labile hypertension    Cancer (HCC)    skin cancer on face   Colitis    DJD (degenerative joint disease)    Endometrial  polyp    GERD (gastroesophageal reflux disease)    History of basal cell carcinoma excision    History of esophagitis    HOH (hard of hearing)    right ear better than left  -- wears no aides   Hyperlipidemia    Hypothyroidism    Neuromuscular disorder (HCC)    neuropathy in feet   Osteopenia    Prediabetes    S/P right hip fracture 08/17/2020   Sepsis (HCC) 2023   per patient   Vitamin D deficiency    Wears glasses     PAST SURGICAL HISTORY: Past Surgical History:  Procedure Laterality Date   BACK SURGERY  2020   CATARACT EXTRACTION, BILATERAL Bilateral 2020   CONVERSION TO TOTAL HIP Right 01/22/2022   Procedure: CONVERSION TO TOTAL HIP;  Surgeon: Joen Laura, MD;  Location: [facility];  Service: Orthopedics;  Laterality: Right;   DILATATION & CURETTAGE/HYSTEROSCOPY WITH MYOSURE N/A 10/27/2014   Procedure: DILATATION & CURETTAGE/HYSTEROSCOPY WITH MYOSURE;  Surgeon: Juluis Mire, MD;  Location: [facility] ;  Service: Gynecology;  Laterality: N/A;   DILATION AND CURETTAGE OF UTERUS     HIP SURGERY Right 04/2020   rod and screw in right hip    MOHS SURGERY  2005   LEFT NASAL BRIDGE FOR BASAL CELL   TONSILLECTOMY  age 83    FAMILY HISTORY: The patient family history includes Autism in her son; Autism spectrum disorder in her son; Cancer in her father, maternal grandmother, mother, and sister; Colitis in her son; Colon cancer (age of onset: 77) in her brother; Diabetes in her brother and maternal grandfather; Healthy in her son; Hyperlipidemia in her brother, father, maternal grandfather, maternal grandmother, mother, paternal grandfather, paternal grandmother, and sister; Hypertension in her brother and mother; Stroke in her maternal grandfather.  SOCIAL HISTORY:  The patient  reports that she quit smoking about 22 years ago. Her smoking use included cigarettes. She has a 3.75 pack-year smoking history. She has never been exposed to tobacco smoke. She has never  used smokeless tobacco. She reports that she does not currently use alcohol. She reports that she does not use drugs.  REVIEW OF SYSTEMS: Review of Systems  Cardiovascular:  Negative for chest pain, dyspnea on exertion, leg swelling, palpitations and syncope.  Respiratory:  Negative for shortness of breath.     PHYSICAL EXAM:    04/29/2023   10:09 AM 04/22/2023    2:19 PM 03/28/2023   11:43 AM  Vitals with BMI  Height 5\' 3"  5' 3.5" 5' 3.5"  Weight 111 lbs 13 oz 112 lbs 110 lbs  BMI 19.81 19.53 19.18  Systolic 137 116 147  Diastolic 75 69 64  Pulse 62 76 70   Orthostatic VS for the past 72 hrs (Last 3 readings):  Orthostatic BP Patient  Position BP Location Cuff Size Orthostatic Pulse  04/29/23 1022 137/80 Standing Left Arm Normal 75  04/29/23 1021 134/82 Sitting Left Arm Normal 66  04/29/23 1020 141/81 Supine Left Arm Normal 62    Physical Exam  Constitutional: No distress.  Age appropriate, hemodynamically stable.   Neck: No JVD present.  Cardiovascular: Normal rate, regular rhythm, S1 normal, S2 normal, intact distal pulses and normal pulses. Exam reveals no gallop, no S3 and no S4.  No murmur heard. Pulmonary/Chest: Effort normal and breath sounds normal. No stridor. She has no wheezes. She has no rales.  Abdominal: Soft. Bowel sounds are normal. She exhibits no distension. There is no abdominal tenderness.  Musculoskeletal:        General: No edema.     Cervical back: Neck supple.  Neurological: She is alert and oriented to person, place, and time. She has intact cranial nerves (2-12).  Skin: Skin is warm and moist.   CARDIAC DATABASE: EKG: April 29, 2023: Sinus rhythm, 62 bpm, right bundle branch block.  Echocardiogram: 11/02/2021:  Normal LV systolic function with visual EF 60-65%. Left ventricle cavity is normal in size. Normal left ventricular wall thickness. Normal global wall motion. Normal diastolic filling pattern, normal LAP.  No significant valvular  abnormalities.  No prior study for comparison.   Stress Testing: Lexiscan (with Mod Bruce protocol) Nuclear stress test 12/04/2020: Nondiagnostic ECG stress. The heart rate response was consistent with walking Lexiscan stress and achieved 84% of MPHR.  Myocardial perfusion is normal. Overall LV systolic function is normal without regional wall motion abnormalities. Stress LV EF: 52%.  No previous exam available for comparison. Low risk.     Heart Catheterization: None  LABORATORY DATA:    Latest Ref Rng & Units 03/28/2023   12:03 PM 02/14/2023   12:49 PM 12/25/2022   10:32 AM  CBC  WBC 3.8 - 10.8 Thousand/uL 6.5  5.7  8.3   Hemoglobin 11.7 - 15.5 g/dL 16.1  09.6  04.5   Hematocrit 35.0 - 45.0 % 37.4  36.1  37.8   Platelets 140 - 400 Thousand/uL 243  237  259        Latest Ref Rng & Units 03/28/2023   12:03 PM 02/14/2023   12:49 PM 12/25/2022   10:32 AM  CMP  Glucose 65 - 99 mg/dL 91  87  83   BUN 7 - 25 mg/dL 11  10  11    Creatinine 0.60 - 0.95 mg/dL 4.09  8.11  9.14   Sodium 135 - 146 mmol/L 133  138  136   Potassium 3.5 - 5.3 mmol/L 4.4  3.8  4.4   Chloride 98 - 110 mmol/L 100  103  101   CO2 20 - 32 mmol/L 25  26  28    Calcium 8.6 - 10.4 mg/dL 9.2  9.2  9.5   Total Protein 6.1 - 8.1 g/dL 6.2  6.1  6.5   Total Bilirubin 0.2 - 1.2 mg/dL 0.4  0.3  0.4   AST 10 - 35 U/L 18  22  16    ALT 6 - 29 U/L 14  14  14      Lipid Panel     Component Value Date/Time   CHOL 169 03/28/2023 1203   TRIG 87 03/28/2023 1203   HDL 59 03/28/2023 1203   CHOLHDL 2.9 03/28/2023 1203   VLDL 13 05/03/2017 1207   LDLCALC 92 03/28/2023 1203    No components found for: "NTPROBNP" No results for input(s): "  PROBNP" in the last 8760 hours. Recent Labs    09/18/22 0000 12/25/22 1032 03/28/23 1203  TSH 0.48 1.49 0.82    BMP Recent Labs    05/13/22 1920 06/18/22 1527 06/22/22 2305 06/24/22 0139 06/27/22 1641 12/25/22 1032 02/14/23 1249 03/28/23 1203  NA 133*   < > 136 134*   < > 136  138 133*  K 4.1   < > 3.3* 3.2*   < > 4.4 3.8 4.4  CL 101   < > 103 103   < > 101 103 100  CO2 23   < > 22 22   < > 28 26 25   GLUCOSE 113*   < > 148* 98   < > 83 87 91  BUN 15   < > 9 11   < > 11 10 11   CREATININE 0.77   < > 0.86 0.76   < > 0.79 0.76 0.76  CALCIUM 9.4   < > 9.0 8.5*   < > 9.5 9.2 9.2  GFRNONAA >60  --  >60 >60  --   --   --   --    < > = values in this interval not displayed.    HEMOGLOBIN A1C Lab Results  Component Value Date   HGBA1C 5.7 (H) 12/25/2022   MPG 117 12/25/2022    IMPRESSION:    ICD-10-CM   1. Benign hypertension  I10 EKG 12-Lead    2. RBBB  I45.10        RECOMMENDATIONS: Debbie Houston is a 82 y.o. Caucasian female whose past medical history and cardiac risk factors include: hypertension, right bundle branch block, hyperlipidemia, GERD, neuropathy, osteoporosis, former smoker, post menopausal, advance age.   Patient presents today for 1 year follow-up visit.  Denies any anginal discomfort or heart failure symptoms.  EKG today shows sinus rhythm with right bundle branch block.  Labs from February/May 2024 independently reviewed.  Hemoglobin A1c within acceptable limits.  LDL 92 mg/dL. Renal function stable.  Reviewed prior echo and stress test results as part of medical decision making at today's office visit.  No additional testing is warranted at this time.  Recommended either 1 year follow-up visits or on as-needed basis.  FINAL MEDICATION LIST END OF ENCOUNTER: No orders of the defined types were placed in this encounter.   Medications Discontinued During This Encounter  Medication Reason   meloxicam (MOBIC) 7.5 MG tablet      Current Outpatient Medications:    acetaminophen (TYLENOL) 500 MG tablet, Take 500 mg by mouth at bedtime as needed for moderate pain or headache. , Disp: , Rfl:    Ascorbic Acid (VITAMIN C) 1000 MG tablet, Take 1,000 mg by mouth daily. , Disp: , Rfl:    aspirin EC 81 MG tablet, Take 1 tablet (81 mg  total) by mouth in the morning and at bedtime. (Patient taking differently: Take 81 mg by mouth daily.), Disp: 60 tablet, Rfl: 0   bisoprolol (ZEBETA) 5 MG tablet, Take 1 tablet every Morning for BP, Disp: 90 tablet, Rfl: 3   Calcium Carbonate-Vit D-Min (CALCIUM 1200 PO), Take 1,200 mg by mouth daily., Disp: , Rfl:    CINNAMON PO, Take 2,000 mg by mouth daily., Disp: , Rfl:    Coenzyme Q10 (COQ10) 200 MG CAPS, Take 200 mg by mouth daily., Disp: , Rfl:    cycloSPORINE (RESTASIS) 0.05 % ophthalmic emulsion, Place 1 drop into both eyes 2 (two) times daily., Disp: , Rfl:  DULoxetine (CYMBALTA) 30 MG capsule, Take 1 capsule (30 mg total) by mouth 2 (two) times daily., Disp: 180 capsule, Rfl: 3   Fluticasone Propionate (FLONASE NA), Place into the nose., Disp: , Rfl:    levothyroxine (SYNTHROID) 125 MCG tablet, TAKE 1 TABLET BY MOUTH DAILY ON AN EMPTY STOMACH WITH ONLY WATER FOR 30 MINUTES(NO ANTACIDS MEDICATIONS, CALCIUM OR MAGNESIUM FOR 4 HOURS) (Patient taking differently: Take 125 mcg by mouth daily before breakfast.), Disp: 90 tablet, Rfl: 0   loperamide (IMODIUM) 2 MG capsule, Take 4 mg by mouth as needed., Disp: , Rfl:    loratadine (CLARITIN) 10 MG tablet, Take 10 mg by mouth daily as needed for allergies., Disp: , Rfl:    Mesalamine (ASACOL PO), Take by mouth. Take once daily, Disp: , Rfl:    Multiple Vitamin (THERA VITAMIN PO), Take 1 tablet by mouth daily., Disp: , Rfl:    Omega-3 Fatty Acids (FISH OIL) 1200 MG CAPS, Take 1,200 mg by mouth daily. , Disp: , Rfl:    pantoprazole (PROTONIX) 40 MG tablet, Take 1 tablet  2 x /day  for Heartburn & Indigestion (Patient taking differently: Take 40 mg by mouth 2 (two) times daily.), Disp: 180 tablet, Rfl: 0   Probiotic Product (PROBIOTIC PO), Take 1 capsule by mouth daily., Disp: , Rfl:    PROLIA 60 MG/ML SOSY injection, Inject 60 mg into the skin every 6 (six) months., Disp: , Rfl:    pseudoephedrine (SUDAFED) 120 MG 12 hr tablet, Take  1 tablet  2 x  /day (every 12 hours)  for Head and Chest Congestion (Patient taking differently: Take 120 mg by mouth daily as needed for congestion (Head and chest congestion).), Disp: 60 tablet, Rfl: 0   simvastatin (ZOCOR) 20 MG tablet, Take 1 tablet at Bedtime for Cholesterol, Disp: 90 tablet, Rfl: 3   STELARA 45 MG/0.5ML SOLN, Inject into the skin., Disp: , Rfl:   Orders Placed This Encounter  Procedures   EKG 12-Lead    There are no Patient Instructions on file for this visit.   --Continue cardiac medications as reconciled in final medication list. --Return in about 1 year (around 04/28/2024), or if symptoms worsen or fail to improve, for Annual follow up visit. Or sooner if needed. --Continue follow-up with your primary care physician regarding the management of your other chronic comorbid conditions.  Patient's questions and concerns were addressed to her satisfaction. She voices understanding of the instructions provided during this encounter.   This note was created using a voice recognition software as a result there may be grammatical errors inadvertently enclosed that do not reflect the nature of this encounter. Every attempt is made to correct such errors.  Debbie Houston, [place], [facility]  Pager:  [phone] Office: [phone]

## 2023-04-30 ENCOUNTER — Other Ambulatory Visit (INDEPENDENT_AMBULATORY_CARE_PROVIDER_SITE_OTHER): Payer: Medicare Other

## 2023-04-30 DIAGNOSIS — G610 Guillain-Barre syndrome: Secondary | ICD-10-CM | POA: Diagnosis not present

## 2023-04-30 DIAGNOSIS — D696 Thrombocytopenia, unspecified: Secondary | ICD-10-CM

## 2023-04-30 DIAGNOSIS — M26623 Arthralgia of bilateral temporomandibular joint: Secondary | ICD-10-CM

## 2023-04-30 LAB — C-REACTIVE PROTEIN: CRP: <1 mg/dL (ref 0.5–20.0)

## 2023-04-30 LAB — SEDIMENTATION RATE: Sed Rate: 10 mm/hr (ref 0–30)

## 2023-05-01 DIAGNOSIS — M542 Cervicalgia: Secondary | ICD-10-CM | POA: Diagnosis not present

## 2023-05-06 DIAGNOSIS — M542 Cervicalgia: Secondary | ICD-10-CM | POA: Diagnosis not present

## 2023-05-10 DIAGNOSIS — M542 Cervicalgia: Secondary | ICD-10-CM | POA: Diagnosis not present

## 2023-05-13 DIAGNOSIS — M542 Cervicalgia: Secondary | ICD-10-CM | POA: Diagnosis not present

## 2023-05-15 ENCOUNTER — Encounter: Payer: Self-pay | Admitting: Nurse Practitioner

## 2023-05-15 ENCOUNTER — Ambulatory Visit (INDEPENDENT_AMBULATORY_CARE_PROVIDER_SITE_OTHER): Payer: Medicare Other | Admitting: Nurse Practitioner

## 2023-05-15 VITALS — BP 128/72 | HR 74 | Temp 97.6°F | Wt 113.6 lb

## 2023-05-15 DIAGNOSIS — N3000 Acute cystitis without hematuria: Secondary | ICD-10-CM

## 2023-05-15 DIAGNOSIS — N3946 Mixed incontinence: Secondary | ICD-10-CM

## 2023-05-15 DIAGNOSIS — K52831 Collagenous colitis: Secondary | ICD-10-CM | POA: Diagnosis not present

## 2023-05-15 MED ORDER — NITROFURANTOIN MONOHYD MACRO 100 MG PO CAPS
100.0000 mg | ORAL_CAPSULE | Freq: Two times a day (BID) | ORAL | 0 refills | Status: AC
Start: 2023-05-15 — End: 2023-05-20

## 2023-05-15 NOTE — Patient Instructions (Signed)
Urinary Tract Infection, Adult  A urinary tract infection (UTI) is an infection of any part of the urinary tract. The urinary tract includes the kidneys, ureters, bladder, and urethra. These organs make, store, and get rid of urine in the body. An upper UTI affects the ureters and kidneys. A lower UTI affects the bladder and urethra. What are the causes? Most urinary tract infections are caused by bacteria in your genital area around your urethra, where urine leaves your body. These bacteria grow and cause inflammation of your urinary tract. What increases the risk? You are more likely to develop this condition if: You have a urinary catheter that stays in place. You are not able to control when you urinate or have a bowel movement (incontinence). You are female and you: Use a spermicide or diaphragm for birth control. Have low estrogen levels. Are pregnant. You have certain genes that increase your risk. You are sexually active. You take antibiotic medicines. You have a condition that causes your flow of urine to slow down, such as: An enlarged prostate, if you are female. Blockage in your urethra. A kidney stone. A nerve condition that affects your bladder control (neurogenic bladder). Not getting enough to drink, or not urinating often. You have certain medical conditions, such as: Diabetes. A weak disease-fighting system (immunesystem). Sickle cell disease. Gout. Spinal cord injury. What are the signs or symptoms? Symptoms of this condition include: Needing to urinate right away (urgency). Frequent urination. This may include small amounts of urine each time you urinate. Pain or burning with urination. Blood in the urine. Urine that smells bad or unusual. Trouble urinating. Cloudy urine. Vaginal discharge, if you are female. Pain in the abdomen or the lower back. You may also have: Vomiting or a decreased appetite. Confusion. Irritability or tiredness. A fever or  chills. Diarrhea. The first symptom in older adults may be confusion. In some cases, they may not have any symptoms until the infection has worsened. How is this diagnosed? This condition is diagnosed based on your medical history and a physical exam. You may also have other tests, including: Urine tests. Blood tests. Tests for STIs (sexually transmitted infections). If you have had more than one UTI, a cystoscopy or imaging studies may be done to determine the cause of the infections. How is this treated? Treatment for this condition includes: Antibiotic medicine. Over-the-counter medicines to treat discomfort. Drinking enough water to stay hydrated. If you have frequent infections or have other conditions such as a kidney stone, you may need to see a health care provider who specializes in the urinary tract (urologist). In rare cases, urinary tract infections can cause sepsis. Sepsis is a life-threatening condition that occurs when the body responds to an infection. Sepsis is treated in the hospital with IV antibiotics, fluids, and other medicines. Follow these instructions at home:  Medicines Take over-the-counter and prescription medicines only as told by your health care provider. If you were prescribed an antibiotic medicine, take it as told by your health care provider. Do not stop using the antibiotic even if you start to feel better. General instructions Make sure you: Empty your bladder often and completely. Do not hold urine for long periods of time. Empty your bladder after sex. Wipe from front to back after urinating or having a bowel movement if you are female. Use each tissue only one time when you wipe. Drink enough fluid to keep your urine pale yellow. Keep all follow-up visits. This is important. Contact a health   care provider if: Your symptoms do not get better after 1-2 days. Your symptoms go away and then return. Get help right away if: You have severe pain in  your back or your lower abdomen. You have a fever or chills. You have nausea or vomiting. Summary A urinary tract infection (UTI) is an infection of any part of the urinary tract, which includes the kidneys, ureters, bladder, and urethra. Most urinary tract infections are caused by bacteria in your genital area. Treatment for this condition often includes antibiotic medicines. If you were prescribed an antibiotic medicine, take it as told by your health care provider. Do not stop using the antibiotic even if you start to feel better. Keep all follow-up visits. This is important. This information is not intended to replace advice given to you by your health care provider. Make sure you discuss any questions you have with your health care provider. Document Revised: 06/12/2020 Document Reviewed: 06/17/2020 Elsevier Patient Education  2024 Elsevier Inc.  

## 2023-05-15 NOTE — Progress Notes (Addendum)
Assessment and Plan:  Debbie Houston was seen today for an episodic visit.  Diagnoses and all order for this visit:  Acute cystitis without hematuria/incontinence Start Macrobid as directed. Continue to stay well hydrated to keep urinary system well flushed Continue cranberry supplement to help aide in reduction of bacteria to bladder wall. Contact office for any increase in fever, chills, N/V.     - nitrofurantoin, macrocrystal-monohydrate, (MACROBID) 100 MG capsule; Take 1 capsule (100 mg total) by mouth 2 (two) times daily for 5 days.  Dispense: 10 capsule; Refill: 0  Orders Placed This Encounter  Procedures   Urine Culture   Urinalysis, Routine w reflex microscopic    Collitis Continue Stelara Continue to follow with GI     Continue to monitor for any increase in fever, chills, N/V, diarrhea, changes to bowel habits, blood in stool.  Notify office for further evaluation and treatment, questions or concerns if s/s fail to improve. The risks and benefits of my recommendations, as well as other treatment options were discussed with the patient today. Questions were answered.  Further disposition pending results of labs. Discussed med's effects and SE's.    Over 15 minutes of exam, counseling, chart review, and critical decision making was performed.   Future Appointments  Date Time Provider Department Center  07/10/2023 11:00 AM Lucky Cowboy, MD [facility] None  09/12/2023  1:10 PM [name], PA-C [facility] None  10/22/2023  1:30 PM [name], DO [facility] None  04/28/2024 10:00 AM Tolia, Sunit, DO [facility] None    ------------------------------------------------------------------------------------------------------------------   HPI BP 128/72   Pulse 74   Temp 97.6 F (36.4 C)   Wt 113 lb 9.6 oz (51.5 kg)   SpO2 98%   BMI 20.12 kg/m    Patient complains of dysuria, frequency, hesitancy, suprapubic pressure, urgency, and low back pain . She has had  symptoms for 2 days. Patient also complains of fever. Patient denies congestion, cough, headache, rhinitis, sorethroat, stomach ache, and vaginal discharge. Patient does have a history of recurrent UTI. Patient does have a history of pyelonephritis. Wears a daily pad for urinary urge and stress incontinence as well as for fecal incontinence d/t colitis.  She has recently started Stelara injections, last 05/09/23.   Past Medical History:  Diagnosis Date   Arthritis    Benign labile hypertension    Cancer (HCC)    skin cancer on face   Colitis    DJD (degenerative joint disease)    Endometrial polyp    GERD (gastroesophageal reflux disease)    History of basal cell carcinoma excision    History of esophagitis    HOH (hard of hearing)    right ear better than left  -- wears no aides   Hyperlipidemia    Hypothyroidism    Neuromuscular disorder (HCC)    neuropathy in feet   Osteopenia    Prediabetes    S/P right hip fracture 08/17/2020   Sepsis (HCC) 2023   per patient   Vitamin D deficiency    Wears glasses      Allergies  Allergen Reactions   Remicade [Infliximab]    Fosamax [Alendronate Sodium] Other (See Comments)    GI upset    Singulair [Montelukast Sodium] Other (See Comments)    Makes pt jittery   Wellbutrin [Bupropion] Hives   Clindamycin/Lincomycin Rash   Sulfa Antibiotics Rash    Current Outpatient Medications on File Prior to Visit  Medication Sig   acetaminophen (TYLENOL) 500  MG tablet Take 500 mg by mouth at bedtime as needed for moderate pain or headache.    Ascorbic Acid (VITAMIN C) 1000 MG tablet Take 1,000 mg by mouth daily.    aspirin EC 81 MG tablet Take 1 tablet (81 mg total) by mouth in the morning and at bedtime. (Patient taking differently: Take 81 mg by mouth daily.)   bisoprolol (ZEBETA) 5 MG tablet Take 1 tablet every Morning for BP   Calcium Carbonate-Vit D-Min (CALCIUM 1200 PO) Take 1,200 mg by mouth daily.   CINNAMON PO Take 2,000 mg by mouth  daily.   Coenzyme Q10 (COQ10) 200 MG CAPS Take 200 mg by mouth daily.   cycloSPORINE (RESTASIS) 0.05 % ophthalmic emulsion Place 1 drop into both eyes 2 (two) times daily.   Fluticasone Propionate (FLONASE NA) Place into the nose.   levothyroxine (SYNTHROID) 125 MCG tablet TAKE 1 TABLET BY MOUTH DAILY ON AN EMPTY STOMACH WITH ONLY WATER FOR 30 MINUTES(NO ANTACIDS MEDICATIONS, CALCIUM OR MAGNESIUM FOR 4 HOURS) (Patient taking differently: Take 125 mcg by mouth daily before breakfast.)   loperamide (IMODIUM) 2 MG capsule Take 4 mg by mouth as needed.   loratadine (CLARITIN) 10 MG tablet Take 10 mg by mouth daily as needed for allergies.   Mesalamine (ASACOL PO) Take by mouth. Take once daily   Multiple Vitamin (THERA VITAMIN PO) Take 1 tablet by mouth daily.   Omega-3 Fatty Acids (FISH OIL) 1200 MG CAPS Take 1,200 mg by mouth daily.    pantoprazole (PROTONIX) 40 MG tablet Take 1 tablet  2 x /day  for Heartburn & Indigestion (Patient taking differently: Take 40 mg by mouth 2 (two) times daily.)   Probiotic Product (PROBIOTIC PO) Take 1 capsule by mouth daily.   PROLIA 60 MG/ML SOSY injection Inject 60 mg into the skin every 6 (six) months.   pseudoephedrine (SUDAFED) 120 MG 12 hr tablet Take  1 tablet  2 x /day (every 12 hours)  for Head and Chest Congestion (Patient taking differently: Take 120 mg by mouth daily as needed for congestion (Head and chest congestion).)   simvastatin (ZOCOR) 20 MG tablet Take 1 tablet at Bedtime for Cholesterol   STELARA 45 MG/0.5ML SOLN Inject into the skin.   DULoxetine (CYMBALTA) 30 MG capsule Take 1 capsule (30 mg total) by mouth 2 (two) times daily. (Patient not taking: Reported on 05/15/2023)   No current facility-administered medications on file prior to visit.    ROS: all negative except what is noted in the HPI.   Physical Exam:  BP 128/72   Pulse 74   Temp 97.6 F (36.4 C)   Wt 113 lb 9.6 oz (51.5 kg)   SpO2 98%   BMI 20.12 kg/m   General  Appearance: NAD.  Awake, conversant and cooperative. Eyes: PERRLA, EOMs intact.  Sclera white.  Conjunctiva without erythema. Sinuses: No frontal/maxillary tenderness.  No nasal discharge. Nares patent.  ENT/Mouth: Ext aud canals clear.  Bilateral TMs w/DOL and without erythema or bulging. Hearing intact.  Posterior pharynx without swelling or exudate.  Tonsils without swelling or erythema.  Neck: Supple.  No masses, nodules or thyromegaly. Respiratory: Effort is regular with non-labored breathing. Breath sounds are equal bilaterally without rales, rhonchi, wheezing or stridor.  Cardio: RRR with no MRGs. Brisk peripheral pulses without edema.  Abdomen: Active BS in all four quadrants.  Soft and non-tender without guarding, rebound tenderness, hernias or masses. Lymphatics: Non tender without lymphadenopathy.  Musculoskeletal: Full ROM, 5/5 strength,  normal ambulation.  No clubbing or cyanosis. Skin: Appropriate color for ethnicity. Warm without rashes, lesions, ecchymosis, ulcers.  Neuro: CN II-XII grossly normal. Normal muscle tone without cerebellar symptoms and intact sensation.   Psych: AO X 3,  appropriate mood and affect, insight and judgment.     Adela Glimpse, NP 4:09 PM [facility]

## 2023-05-15 NOTE — Addendum Note (Signed)
Addended by: Adela Glimpse on: 05/15/2023 04:52 PM   Modules accepted: Orders

## 2023-05-16 DIAGNOSIS — M542 Cervicalgia: Secondary | ICD-10-CM | POA: Diagnosis not present

## 2023-05-16 LAB — URINALYSIS, ROUTINE W REFLEX MICROSCOPIC
Bacteria, UA: NONE SEEN /HPF
Ketones, ur: NEGATIVE
Nitrite: NEGATIVE
Protein, ur: NEGATIVE
Specific Gravity, Urine: 1.006 (ref 1.001–1.035)
WBC, UA: >=60 /HPF — AB (ref 0–5)

## 2023-05-16 LAB — URINE CULTURE: MICRO NUMBER:: 15131472

## 2023-05-18 ENCOUNTER — Encounter: Payer: Self-pay | Admitting: Nurse Practitioner

## 2023-05-18 LAB — URINALYSIS, ROUTINE W REFLEX MICROSCOPIC
Bilirubin Urine: NEGATIVE
Glucose, UA: NEGATIVE
Hyaline Cast: NONE SEEN /LPF
RBC / HPF: NONE SEEN /HPF (ref 0–2)
Squamous Epithelial / HPF: NONE SEEN /HPF (ref <=5)
pH: 6 (ref 5.0–8.0)

## 2023-05-18 LAB — URINE CULTURE: SPECIMEN QUALITY:: ADEQUATE

## 2023-05-18 LAB — MICROSCOPIC MESSAGE

## 2023-05-20 ENCOUNTER — Encounter: Payer: Self-pay | Admitting: Nurse Practitioner

## 2023-05-20 DIAGNOSIS — M542 Cervicalgia: Secondary | ICD-10-CM | POA: Diagnosis not present

## 2023-05-22 DIAGNOSIS — M542 Cervicalgia: Secondary | ICD-10-CM | POA: Diagnosis not present

## 2023-05-27 DIAGNOSIS — E038 Other specified hypothyroidism: Secondary | ICD-10-CM | POA: Diagnosis not present

## 2023-05-27 DIAGNOSIS — E063 Autoimmune thyroiditis: Secondary | ICD-10-CM | POA: Diagnosis not present

## 2023-05-28 DIAGNOSIS — M542 Cervicalgia: Secondary | ICD-10-CM | POA: Diagnosis not present

## 2023-05-31 DIAGNOSIS — M542 Cervicalgia: Secondary | ICD-10-CM | POA: Diagnosis not present

## 2023-06-03 DIAGNOSIS — M542 Cervicalgia: Secondary | ICD-10-CM | POA: Diagnosis not present

## 2023-06-05 DIAGNOSIS — R109 Unspecified abdominal pain: Secondary | ICD-10-CM | POA: Diagnosis not present

## 2023-06-05 DIAGNOSIS — R197 Diarrhea, unspecified: Secondary | ICD-10-CM | POA: Diagnosis not present

## 2023-06-06 DIAGNOSIS — M542 Cervicalgia: Secondary | ICD-10-CM | POA: Diagnosis not present

## 2023-06-11 DIAGNOSIS — M542 Cervicalgia: Secondary | ICD-10-CM | POA: Diagnosis not present

## 2023-06-13 DIAGNOSIS — M542 Cervicalgia: Secondary | ICD-10-CM | POA: Diagnosis not present

## 2023-06-17 NOTE — Progress Notes (Unsigned)
Assessment and Plan: Debbie Houston was seen today for urinary tract infection.  Diagnoses and all orders for this visit:  Essential hypertension - continue medications, DASH diet, exercise and monitor at home. Call if greater than 130/80.   Abnormal urine Had recent infection which turned to sepsis and then had C. Diff- completed antibiotic course Will treat pending urine results -     Urinalysis, Routine w reflex microscopic -     Urine Culture  Chronic fatigue Advised to restart Cymbalta for mood and pain Continue Vit D supplementation -     CBC with Differential/Platelet -     COMPLETE METABOLIC PANEL WITH GFR  Medication management -     CBC with Differential/Platelet -     COMPLETE METABOLIC PANEL WITH GFR -     Urinalysis, Routine w reflex microscopic -     Urine Culture       Further disposition pending results of labs. Discussed med's effects and SE's.   Over 30 minutes of exam, counseling, chart review, and critical decision making was performed.   Future Appointments  Date Time Provider Department Center  07/10/2023 11:00 AM Lucky Cowboy, MD [facility] None  09/12/2023  1:10 PM [name], PA-C [facility] None  10/22/2023  1:30 PM [name], DO [facility] None  04/28/2024 10:00 AM Tolia, Sunit, DO [facility] None    ------------------------------------------------------------------------------------------------------------------   HPI BP 126/68   Pulse 70   Temp 97.7 F (36.5 C)   Ht 5' 3.5" (1.613 m)   Wt 111 lb 9.6 oz (50.6 kg)   SpO2 97%   BMI 19.46 kg/m   82 y.o.female presents for complaints of low grade fever x several weeks.  She just got over C. Diff infection. Denies dysuria, hematuria, frequency and urgency. Has fatigue, occ headache.  She is taking Claritin daily.   She has a large raised skin lesion on back of left thigh. It does not itch or hurt.  Has been present for approximately several weeks. It is more noticeable and has become 2  different colors.  Has appt scheduled with dermatology 08/02/23   BP well controlled with bisoprolol 5 mg every day. BP Readings from Last 3 Encounters:  06/18/23 126/68  05/15/23 128/72  04/29/23 137/75  Denies headaches, chest pain, shortness of breath and dizziness   BMI is Body mass index is 19.46 kg/m., she has been working on diet and exercise. Wt Readings from Last 3 Encounters:  06/18/23 111 lb 9.6 oz (50.6 kg)  05/15/23 113 lb 9.6 oz (51.5 kg)  04/29/23 111 lb 12.8 oz (50.7 kg)     Past Medical History:  Diagnosis Date   Arthritis    Benign labile hypertension    Cancer (HCC)    skin cancer on face   Colitis    DJD (degenerative joint disease)    Endometrial polyp    GERD (gastroesophageal reflux disease)    History of basal cell carcinoma excision    History of esophagitis    HOH (hard of hearing)    right ear better than left  -- wears no aides   Hyperlipidemia    Hypothyroidism    Neuromuscular disorder (HCC)    neuropathy in feet   Osteopenia    Prediabetes    S/P right hip fracture 08/17/2020   Sepsis (HCC) 2023   per patient   Vitamin D deficiency    Wears glasses      Allergies  Allergen Reactions   Remicade [Infliximab]  Fosamax [Alendronate Sodium] Other (See Comments)    GI upset    Singulair [Montelukast Sodium] Other (See Comments)    Makes pt jittery   Wellbutrin [Bupropion] Hives   Clindamycin/Lincomycin Rash   Sulfa Antibiotics Rash    Current Outpatient Medications on File Prior to Visit  Medication Sig   acetaminophen (TYLENOL) 500 MG tablet Take 500 mg by mouth at bedtime as needed for moderate pain or headache.    Ascorbic Acid (VITAMIN C) 1000 MG tablet Take 1,000 mg by mouth daily.    aspirin EC 81 MG tablet Take 1 tablet (81 mg total) by mouth in the morning and at bedtime. (Patient taking differently: Take 81 mg by mouth daily.)   bisoprolol (ZEBETA) 5 MG tablet Take 1 tablet every Morning for BP   Calcium Carbonate-Vit  D-Min (CALCIUM 1200 PO) Take 1,200 mg by mouth daily.   CINNAMON PO Take 2,000 mg by mouth daily.   Coenzyme Q10 (COQ10) 200 MG CAPS Take 200 mg by mouth daily.   cycloSPORINE (RESTASIS) 0.05 % ophthalmic emulsion Place 1 drop into both eyes 2 (two) times daily.   Fluticasone Propionate (FLONASE NA) Place into the nose.   levothyroxine (SYNTHROID) 125 MCG tablet TAKE 1 TABLET BY MOUTH DAILY ON AN EMPTY STOMACH WITH ONLY WATER FOR 30 MINUTES(NO ANTACIDS MEDICATIONS, CALCIUM OR MAGNESIUM FOR 4 HOURS) (Patient taking differently: Take 125 mcg by mouth daily before breakfast.)   loperamide (IMODIUM) 2 MG capsule Take 4 mg by mouth as needed.   loratadine (CLARITIN) 10 MG tablet Take 10 mg by mouth daily as needed for allergies.   Mesalamine (ASACOL PO) Take by mouth. Take once daily   Multiple Vitamin (THERA VITAMIN PO) Take 1 tablet by mouth daily.   Omega-3 Fatty Acids (FISH OIL) 1200 MG CAPS Take 1,200 mg by mouth daily.    pantoprazole (PROTONIX) 40 MG tablet Take 1 tablet  2 x /day  for Heartburn & Indigestion (Patient taking differently: Take 40 mg by mouth 2 (two) times daily.)   Probiotic Product (PROBIOTIC PO) Take 1 capsule by mouth daily.   PROLIA 60 MG/ML SOSY injection Inject 60 mg into the skin every 6 (six) months.   pseudoephedrine (SUDAFED) 120 MG 12 hr tablet Take  1 tablet  2 x /day (every 12 hours)  for Head and Chest Congestion (Patient taking differently: Take 120 mg by mouth daily as needed for congestion (Head and chest congestion).)   simvastatin (ZOCOR) 20 MG tablet Take 1 tablet at Bedtime for Cholesterol   STELARA 45 MG/0.5ML SOLN Inject into the skin.   DULoxetine (CYMBALTA) 30 MG capsule Take 1 capsule (30 mg total) by mouth 2 (two) times daily. (Patient not taking: Reported on 05/15/2023)   No current facility-administered medications on file prior to visit.    ROS: all negative except above.   Physical Exam:  BP 126/68   Pulse 70   Temp 97.7 F (36.5 C)   Ht  5' 3.5" (1.613 m)   Wt 111 lb 9.6 oz (50.6 kg)   SpO2 97%   BMI 19.46 kg/m   General Appearance: Well nourished, in no apparent distress. Eyes: PERRLA, EOMs, conjunctiva no swelling or erythema Sinuses: No Frontal/maxillary tenderness ENT/Mouth: Ext aud canals clear, TMs without erythema, bulging. No erythema, swelling, or exudate on post pharynx.  Hearing normal.  Neck: Supple, thyroid normal.  Respiratory: Respiratory effort normal, BS equal bilaterally without rales, rhonchi, wheezing or stridor.  Cardio: RRR with no MRGs. Brisk  peripheral pulses without edema.  Abdomen: Soft, + BS.  Non tender, no guarding, rebound, hernias, masses. Lymphatics: Non tender without lymphadenopathy.  Musculoskeletal: Full ROM, 5/5 strength, normal gait.  Skin: Warm, dry . 1-2 cm raised lesion on back of left thigh multicolored. Neuro: Cranial nerves intact. Normal muscle tone, no cerebellar symptoms. Sensation intact.  Psych: Awake and oriented X 3, normal affect, Insight and Judgment appropriate.     Raynelle Dick, NP 1:49 PM [facility].

## 2023-06-18 ENCOUNTER — Ambulatory Visit (INDEPENDENT_AMBULATORY_CARE_PROVIDER_SITE_OTHER): Payer: Medicare Other | Admitting: Nurse Practitioner

## 2023-06-18 ENCOUNTER — Encounter: Payer: Self-pay | Admitting: Nurse Practitioner

## 2023-06-18 VITALS — BP 126/68 | HR 70 | Temp 97.7°F | Ht 63.5 in | Wt 111.6 lb

## 2023-06-18 DIAGNOSIS — I10 Essential (primary) hypertension: Secondary | ICD-10-CM | POA: Diagnosis not present

## 2023-06-18 DIAGNOSIS — Z79899 Other long term (current) drug therapy: Secondary | ICD-10-CM

## 2023-06-18 DIAGNOSIS — R8290 Unspecified abnormal findings in urine: Secondary | ICD-10-CM

## 2023-06-18 DIAGNOSIS — R5382 Chronic fatigue, unspecified: Secondary | ICD-10-CM

## 2023-06-18 LAB — CBC WITH DIFFERENTIAL/PLATELET
Absolute Monocytes: 546 cells/uL (ref 200–950)
Basophils Absolute: 50 cells/uL (ref 0–200)
Basophils Relative: 0.8 %
Eosinophils Absolute: 167 cells/uL (ref 15–500)
Eosinophils Relative: 2.7 %
HCT: 36 % (ref 35.0–45.0)
Hemoglobin: 12.3 g/dL (ref 11.7–15.5)
Lymphs Abs: 1221 cells/uL (ref 850–3900)
MCH: 32.2 pg (ref 27.0–33.0)
MCHC: 34.2 g/dL (ref 32.0–36.0)
MCV: 94.2 fL (ref 80.0–100.0)
MPV: 10.7 fL (ref 7.5–12.5)
Monocytes Relative: 8.8 %
Neutro Abs: 4216 cells/uL (ref 1500–7800)
Neutrophils Relative %: 68 %
Platelets: 205 10*3/uL (ref 140–400)
RBC: 3.82 10*6/uL (ref 3.80–5.10)
RDW: 13.2 % (ref 11.0–15.0)
Total Lymphocyte: 19.7 %
WBC: 6.2 10*3/uL (ref 3.8–10.8)

## 2023-06-18 NOTE — Patient Instructions (Signed)
Stop Claritin and begin generic Allegra daily Continue Flonase daily  Will have urine results tomorrow and culture on Thursday- push fluids    Allergic Rhinitis, Adult  Allergic rhinitis is an allergic reaction that affects the mucous membrane inside the nose. The mucous membrane is the tissue that produces mucus. There are two types of allergic rhinitis: Seasonal. This type is also called hay fever and happens only during certain seasons. Perennial. This type can happen at any time of the year. Allergic rhinitis cannot be spread from person to person. This condition can be mild, bad, or very bad. It can develop at any age and may be outgrown. What are the causes? This condition is caused by allergens. These are things that can cause an allergic reaction. Allergens may differ for seasonal allergic rhinitis and perennial allergic rhinitis. Seasonal allergic rhinitis is caused by pollen. Pollen can come from grasses, trees, and weeds. Perennial allergic rhinitis may be caused by: Dust mites. Proteins in a pet's pee (urine), saliva, or dander. Dander is dead skin cells from a pet. Smoke, mold, or car fumes. Remains of or waste from insects such as cockroaches. What increases the risk? You are more likely to develop this condition if you have a family history of allergies or other conditions related to allergies, including: Allergic conjunctivitis. This is irritation and swelling of parts of the eyes and eyelids. Asthma. This condition affects the lungs and makes it hard to breathe. Atopic dermatitis or eczema. This is long term (chronic) irritation and swelling of the skin. Food allergies. What are the signs or symptoms? Symptoms of this condition include: Sneezing or coughing. A stuffy nose (nasal congestion), itchy nose, or nasal discharge. Itchy eyes and tearing of the eyes. A feeling of mucus dripping down the back of your throat (postnasal drip). This may cause a sore  throat. Trouble sleeping. Tiredness. Headache. How is this diagnosed? This condition may be diagnosed with your symptoms, your medical history, and a physical exam. Your health care provider may check for related conditions, such as: Asthma. Pink eye. This is eye swelling and irritation caused by infection (conjunctivitis). Ear infection. Upper respiratory infection. This is an infection in the nose, throat, or upper airways. You may also have tests to find out which allergens cause your symptoms. These may include skin tests or blood tests. How is this treated? There is no cure for this condition, but treatment can help control symptoms. Treatment may include: Taking medicines that block allergy symptoms, such as corticosteroids (anti-inflammatories) and antihistamines. Medicine may be given as a shot, nasal spray, or pill. Avoiding any allergens. Being exposed again and again to tiny amounts of allergens to help you build a defense against allergens (allergenimmunotherapy). This is done if other treatments have not helped. It may include: Allergy shots. These are injected medicines that have small amounts of an allergen in them. Sublingual immunotherapy. This involves taking small doses of a medicine with an allergen in it under your tongue. If these treatments do not work, your provider may prescribe newer, stronger medicines. Follow these instructions at home: Avoiding allergens Find out what you are allergic to and avoid those allergens. These are some things you can do to help avoid allergens: If you have perennial allergies: Replace carpet with wood, tile, or vinyl flooring. Carpet can trap dander and dust. Do not smoke. Do not allow smoking in your home Change your heating and air conditioning filters at least once a month. If you have seasonal allergies,  take these steps during allergy season: Keep windows closed as much as possible. Plan outdoor activities when pollen counts are  lowest. Check pollen counts before you plan outdoor activities When coming indoors, change clothing and shower before sitting on furniture or bedding. If you have a pet in the house that produces allergens: Keep the pet out of the bedroom. Vacuum, sweep, and dust regularly. General instructions Take over-the-counter and prescription medicines only as told by your provider. Drink enough fluid to keep your pee pale yellow. Where to find more information American Academy of Allergy, Asthma & Immunology: aaaai.org Contact a health care provider if: You have a fever. You develop a cough that does not go away. You make high-pitched whistling sounds when you breathe, most often when you breathe out (wheeze). Your symptoms slow you down or stop you from doing your normal activities each day. Get help right away if: You have shortness of breath. This symptom may be an emergency. Get help right away. Call 911. Do not wait to see if the symptoms will go away. Do not drive yourself to the hospital. This information is not intended to replace advice given to you by your health care provider. Make sure you discuss any questions you have with your health care provider. Document Revised: 07/16/2022 Document Reviewed: 07/16/2022 Elsevier Patient Education  2024 [employer].

## 2023-06-19 DIAGNOSIS — M542 Cervicalgia: Secondary | ICD-10-CM | POA: Diagnosis not present

## 2023-06-24 ENCOUNTER — Encounter: Payer: Medicare Other | Admitting: Internal Medicine

## 2023-06-25 ENCOUNTER — Encounter: Payer: Self-pay | Admitting: Internal Medicine

## 2023-06-25 ENCOUNTER — Encounter: Payer: Medicare Other | Admitting: Internal Medicine

## 2023-06-26 DIAGNOSIS — M542 Cervicalgia: Secondary | ICD-10-CM | POA: Diagnosis not present

## 2023-06-28 DIAGNOSIS — Z9849 Cataract extraction status, unspecified eye: Secondary | ICD-10-CM | POA: Diagnosis not present

## 2023-06-28 DIAGNOSIS — H53143 Visual discomfort, bilateral: Secondary | ICD-10-CM | POA: Diagnosis not present

## 2023-06-28 DIAGNOSIS — H04123 Dry eye syndrome of bilateral lacrimal glands: Secondary | ICD-10-CM | POA: Diagnosis not present

## 2023-06-28 DIAGNOSIS — Z961 Presence of intraocular lens: Secondary | ICD-10-CM | POA: Diagnosis not present

## 2023-07-02 DIAGNOSIS — M542 Cervicalgia: Secondary | ICD-10-CM | POA: Diagnosis not present

## 2023-07-03 DIAGNOSIS — D485 Neoplasm of uncertain behavior of skin: Secondary | ICD-10-CM | POA: Diagnosis not present

## 2023-07-03 DIAGNOSIS — Z85828 Personal history of other malignant neoplasm of skin: Secondary | ICD-10-CM | POA: Diagnosis not present

## 2023-07-03 DIAGNOSIS — B078 Other viral warts: Secondary | ICD-10-CM | POA: Diagnosis not present

## 2023-07-03 NOTE — Telephone Encounter (Signed)
Done

## 2023-07-10 ENCOUNTER — Encounter: Payer: Medicare Other | Admitting: Internal Medicine

## 2023-07-10 DIAGNOSIS — M542 Cervicalgia: Secondary | ICD-10-CM | POA: Diagnosis not present

## 2023-07-11 DIAGNOSIS — R1310 Dysphagia, unspecified: Secondary | ICD-10-CM | POA: Diagnosis not present

## 2023-07-11 DIAGNOSIS — K219 Gastro-esophageal reflux disease without esophagitis: Secondary | ICD-10-CM | POA: Diagnosis not present

## 2023-07-11 DIAGNOSIS — J387 Other diseases of larynx: Secondary | ICD-10-CM | POA: Diagnosis not present

## 2023-07-12 DIAGNOSIS — K529 Noninfective gastroenteritis and colitis, unspecified: Secondary | ICD-10-CM | POA: Diagnosis not present

## 2023-07-16 DIAGNOSIS — M542 Cervicalgia: Secondary | ICD-10-CM | POA: Diagnosis not present

## 2023-07-19 MED ORDER — PREDNISONE 10 MG TABLET
ORAL_TABLET | Freq: Every day | ORAL | 0 refills | 30 days | Status: CP
Start: 2023-07-19 — End: ?

## 2023-07-23 DIAGNOSIS — M542 Cervicalgia: Secondary | ICD-10-CM | POA: Diagnosis not present

## 2023-07-29 ENCOUNTER — Telehealth: Admit: 2023-07-29 | Discharge: 2023-07-30 | Payer: MEDICARE | Attending: Internal Medicine | Primary: Internal Medicine

## 2023-07-29 DIAGNOSIS — K523 Indeterminate colitis: Secondary | ICD-10-CM | POA: Diagnosis not present

## 2023-07-29 MED ORDER — VANCOMYCIN 125 MG CAPSULE
ORAL_CAPSULE | Freq: Two times a day (BID) | ORAL | 0 refills | 30 days | Status: CP
Start: 2023-07-29 — End: ?

## 2023-07-30 DIAGNOSIS — E559 Vitamin D deficiency, unspecified: Secondary | ICD-10-CM | POA: Diagnosis not present

## 2023-07-30 DIAGNOSIS — K52839 Microscopic colitis, unspecified: Secondary | ICD-10-CM | POA: Diagnosis not present

## 2023-07-31 ENCOUNTER — Ambulatory Visit (INDEPENDENT_AMBULATORY_CARE_PROVIDER_SITE_OTHER): Payer: Medicare Other | Admitting: Internal Medicine

## 2023-07-31 ENCOUNTER — Encounter: Payer: Self-pay | Admitting: Internal Medicine

## 2023-07-31 VITALS — BP 130/78 | HR 70 | Temp 97.7°F | Resp 16 | Ht 64.0 in | Wt 111.2 lb

## 2023-07-31 DIAGNOSIS — I4510 Unspecified right bundle-branch block: Secondary | ICD-10-CM | POA: Diagnosis not present

## 2023-07-31 DIAGNOSIS — E039 Hypothyroidism, unspecified: Secondary | ICD-10-CM | POA: Diagnosis not present

## 2023-07-31 DIAGNOSIS — M8000XA Age-related osteoporosis with current pathological fracture, unspecified site, initial encounter for fracture: Secondary | ICD-10-CM

## 2023-07-31 DIAGNOSIS — Z79899 Other long term (current) drug therapy: Secondary | ICD-10-CM | POA: Diagnosis not present

## 2023-07-31 DIAGNOSIS — E782 Mixed hyperlipidemia: Secondary | ICD-10-CM

## 2023-07-31 DIAGNOSIS — Z136 Encounter for screening for cardiovascular disorders: Secondary | ICD-10-CM | POA: Diagnosis not present

## 2023-07-31 DIAGNOSIS — Z8249 Family history of ischemic heart disease and other diseases of the circulatory system: Secondary | ICD-10-CM

## 2023-07-31 DIAGNOSIS — E559 Vitamin D deficiency, unspecified: Secondary | ICD-10-CM

## 2023-07-31 DIAGNOSIS — K2100 Gastro-esophageal reflux disease with esophagitis, without bleeding: Secondary | ICD-10-CM | POA: Diagnosis not present

## 2023-07-31 DIAGNOSIS — K52831 Collagenous colitis: Secondary | ICD-10-CM

## 2023-07-31 DIAGNOSIS — R7309 Other abnormal glucose: Secondary | ICD-10-CM

## 2023-07-31 DIAGNOSIS — Z1211 Encounter for screening for malignant neoplasm of colon: Secondary | ICD-10-CM

## 2023-07-31 DIAGNOSIS — I10 Essential (primary) hypertension: Secondary | ICD-10-CM | POA: Diagnosis not present

## 2023-07-31 NOTE — Patient Instructions (Signed)

## 2023-07-31 NOTE — Progress Notes (Signed)
Annual Screening/Preventative Visit &  Comprehensive Evaluation &  Examination   Future Appointments  Date Time Provider Department  07/31/2023                      cpe 10:00 AM Debbie Cowboy, MD [facility]  09/12/2023  1:10 PM Debbie Bienenstock, PA-C [facility]  10/22/2023  1:30 PM Debbie Sickle K, DO [facility]  04/28/2024 10:00 AM Debbie Lerner, DO [facility]  08/06/2024                       cpe 10:00 AM Debbie Cowboy, MD [facility]          This very nice 82 y.o. MWF presents for a Screening /Preventative Visit & comprehensive evaluation and management of multiple medical co-morbidities.  Patient has been followed for HTN, HLD, Pre-Diabetes, Osteoporosis,  Hypothyroidism and Vitamin D Deficiency.    Dr Debbie Houston follows patient for polyradiculoneuropathy of hands & feet.  \        Patient is followed by Dr Debbie Houston / GI at [facility] for  GERD & is on  Vancomycin, Asacol  & Prednisone for  Collagenous Colitis.                      In Jan 2020 , patient had a L4-L5 decompression / Stabilization of Spondylolisthesis by Dr Debbie Houston for lumbar radiculopathy and neurogenic claudication.        On May 10, 2022, patient had a cervical ablation by Dr. Lorrine Houston / Gyn . Then patient was seen in the ER on 6/23 and 6/25 for neg w/u of pain & referred back to her surgeon         HTN predates since 2015. Patient's BP has been controlled at home and patient denies any cardiac symptoms as chest pain, palpitations, shortness of breath, dizziness or ankle swelling. Today's BP is at goal - 130/78 .        Patient's hyperlipidemia is controlled with diet and Simvastatin. Patient denies myalgias or other medication SE's. Last lipids were at goal :  Lab Results  Component Value Date   CHOL 163 11/21/2021   HDL 60 11/21/2021   LDL CALC 84 11/21/2021   TRIG 95 11/21/2021   CHOLHDL 2.7 11/21/2021                                                     Patient has Hypothyroidism and has been on  Thyroid Replacement since 1990.        Patient has hx/o  prediabetes (A1c 5.7% /2019) and patient denies reactive hypoglycemic symptoms, visual blurring, diabetic polys or paresthesias. Last A1c was normal & at goal :  Lab Results  Component Value Date   HGBA1C 5.5 11/21/2021         Finally, patient has history of Vitamin D Deficiency and last Vitamin D was at goal_:  Lab Results  Component Value Date   VD25OH 64 11/21/2021     Current Outpatient Medications on File Prior to Visit  Medication Sig   acetaminophen (TYLENOL) 500 MG tablet Take at bedtime as needed for moderate pain or headache.    Ascorbic Acid (VITAMIN C) 1000 MG tablet Take daily.    aspirin EC 81 MG tablet Take  1 tablet  in the morning and at bedtime.   bisoprolol (ZEBETA) 5 MG tablet TAKE 1/2 TABLET EVERY DAY   Calcium Carbonate-Vit D-Min 1200  Take  daily.   cetirizine 10 MG tablet Take  at bedtime.   CINNAMON 2,000 mg  Take daily.   Coenzyme Q10  Take 200 mg  daily.   colestipo1  tablet Take 4 g daily.   RESTASIS 0.05 % ophth emulsion Place 1 drop into both eyes 2  times daily.      ANUSOL-HC  25 MG suppository Place 1 suppository  rectally 2 times daily as needed   levothyroxine  125 MCG tablet  Take 125 mcg every other day   loratadine 10 MG tablet Take daily as needed    Multiple Vitamin  Take 1 tablet daily.   mupirocin ointment (BACTROBAN) 2 % Apply 1 application. topically 2  times daily.   Omega-3 FISH OIL 1200 MG CAPS Take  daily.    pantoprazole 40 MG tablet Take 1 tablet  2 x /day    Probiotic  Take 1 capsule daily.   PROLIA 60 MG/ML injec Inject 60 mg into the skin every 6 months.   pseudoephedrine 120 MG 12 hr tablet Take  1 tablet  2 x /day     Remicade  Infusion     simvastatin (ZOCOR) 20 MG tablet Take  1 tablet  at Bedtime      Allergies  Allergen Reactions   Fosamax [Alendronate Sodium] Other (See Comments)    GI upset    Singulair [Montelukast Sodium] Other (See Comments)     Makes pt jittery   Wellbutrin [Bupropion] Hives   Clindamycin/Lincomycin Rash   Sulfa Antibiotics Rash     Past Medical History:  Diagnosis Date   Arthritis    Benign labile hypertension    Cancer (HCC)    skin cancer on face   Colitis    DJD (degenerative joint disease)    Endometrial polyp    GERD (gastroesophageal reflux disease)    History of basal cell carcinoma excision    History of esophagitis    HOH (hard of hearing)    right ear better than left  -- wears no aides   Hyperlipidemia    Hypothyroidism    Neuromuscular disorder (HCC)    neuropathy in feet   Osteopenia    Prediabetes    S/P right hip fracture 08/17/2020   Vitamin D deficiency    Wears glasses      Health Maintenance  Topic Date Due   Pneumonia Vaccine 31+ Years old (2 - PPSV23 or PCV20) 05/04/2016   COVID-19 Vaccine (5 - Booster for Pfizer series) 06/09/2021   TETANUS/TDAP  08/21/2022 (Originally 08/21/2021)   INFLUENZA VACCINE  06/19/2022   MAMMOGRAM  01/11/2023   DEXA SCAN  Completed   Zoster Vaccines- Shingrix  Completed   HPV VACCINES  Aged Out    Immunization History  Administered Date(s) Administered   Influenza, High Dose Seasonal PF  08/22/2018, 08/25/2019, 08/30/2020, 08/21/2021   PFIZER- SARS-COV-2 Vacc 12/10/2019, 12/31/2019, 08/16/2020, 04/14/2021   Pneumococcal -13 05/05/2015   Pneumococcal-23 08/22/2011   Tdap 08/22/2011   Zoster Recombinat (Shingrix) 09/13/2017, 01/01/2018   Zoster, Live 06/20/2006     05/13/2019 Colonoscopy -  Dr Debbie [facility] / [facility] with a dx/o Collagenous Colitis.  Last Colon - 10/16/2021 - Dr Debbie Houston, Biopsy of Nodular sigmoid mucosa - Path never received.  Last EGD - 10/17/2019 - Dr Debbie Houston -  Last MGM - 01/11/2022   Past Surgical History:  Procedure Laterality Date   BACK SURGERY  2020   CATARACT EXTRACTION, BILATERAL Bilateral 2020   CONVERSION TO TOTAL HIP Right 01/22/2022   Procedure: CONVERSION TO TOTAL HIP;  Surgeon: Debbie Laura, MD;  Location: [facility];  Service: Orthopedics;  Laterality: Right;   DILATATION & CURETTAGE/HYSTEROSCOPY WITH MYOSURE N/A 10/27/2014   Procedure: DILATATION & CURETTAGE/HYSTEROSCOPY WITH MYOSURE;  Surgeon: Debbie Mire, MD;  Location: [facility];  Service: Gynecology;  Laterality: N/A;   DILATION AND CURETTAGE OF UTERUS     HIP SURGERY Right 04/2020   rod and screw in right hip    MOHS SURGERY  2005   LEFT NASAL BRIDGE FOR BASAL CELL   TONSILLECTOMY  age 23     Family History  Problem Relation Age of Onset   Hypertension Mother    Cancer Mother        COLON   Hyperlipidemia Mother    Cancer Father        BREAST WITH BRAIN METS   Hyperlipidemia Father    Cancer Sister        COLON (TEENS), BREAST (50)   Hyperlipidemia Sister    Diabetes Brother    Hyperlipidemia Brother    Hypertension Brother    Colon cancer Brother 56   Cancer Maternal Grandmother        RENAL   Hyperlipidemia Maternal Grandmother    Diabetes Maternal Grandfather    Stroke Maternal Grandfather    Hyperlipidemia Maternal Grandfather    Hyperlipidemia Paternal Grandmother    Hyperlipidemia Paternal Grandfather    Autism Son    Colitis Son    Autism spectrum disorder Son    Healthy Son      Social History   Tobacco Use   Smoking status: Former    Packs/day: 0.25    Years: 15.00    Total pack years: 3.75    Types: Cigarettes    Quit date: 2002    Years since quitting: 21.5    Passive exposure: Never   Smokeless tobacco: Never   Tobacco comments:    Former light smoker, quit 15+ years ago, can't recall  Vaping Use   Vaping Use: Never used  Substance Use Topics   Alcohol use: Not Currently   Drug use: No      ROS Constitutional: Denies fever, chills, weight loss/gain, headaches, insomnia,  night sweats, and change in appetite. Does c/o fatigue. Eyes: Denies redness, blurred vision, diplopia, discharge, itchy, watery eyes.  ENT: Denies discharge, congestion, post  nasal drip, epistaxis, sore throat, earache, hearing loss, dental pain, Tinnitus, Vertigo, Sinus pain, snoring.  Cardio: Denies chest pain, palpitations, irregular heartbeat, syncope, dyspnea, diaphoresis, orthopnea, PND, claudication, edema Respiratory: denies cough, dyspnea, DOE, pleurisy, hoarseness, laryngitis, wheezing.  Gastrointestinal: Denies dysphagia, heartburn, reflux, water brash, pain, cramps, nausea, vomiting, bloating, diarrhea, constipation, hematemesis, melena, hematochezia, jaundice, hemorrhoids Genitourinary: Denies dysuria, frequency, urgency, nocturia, hesitancy, discharge, hematuria, flank pain Breast: Breast lumps, nipple discharge, bleeding.  Musculoskeletal: Denies arthralgia, myalgia, stiffness, Jt. Swelling, pain, limp, and strain/sprain. Denies falls. Skin: Denies puritis, rash, hives, warts, acne, eczema, changing in skin lesion Neuro: No weakness, tremor, incoordination, spasms, paresthesia, pain Psychiatric: Denies confusion, memory loss, sensory loss. Denies Depression. Endocrine: Denies change in weight, skin, hair change, nocturia, and paresthesia, diabetic polys, visual blurring, hyper / hypo glycemic episodes.  Heme/Lymph: No excessive bleeding, bruising, enlarged lymph nodes.  Physical Exam  BP 130/78  Pulse 70   Temp 97.7 F (36.5 C)   Resp 16   Ht 5\' 4"  (1.626 m)   Wt 111 lb 3.2 oz (50.4 kg)   SpO2 99%   BMI 19.09 kg/m   General Appearance: Well nourished, well groomed and in no apparent distress.  Eyes: PERRLA, EOMs, conjunctiva no swelling or erythema, normal fundi and vessels. Sinuses: No frontal/maxillary tenderness ENT/Mouth: EACs patent / TMs  nl. Nares clear without erythema, swelling, mucoid exudates. Oral hygiene is good. No erythema, swelling, or exudate. Tongue normal, non-obstructing. Tonsils not swollen or erythematous. Hearing normal.  Neck: Supple, thyroid not palpable. No bruits, nodes or JVD. Respiratory: Respiratory effort  normal.  BS equal and clear bilateral without rales, rhonci, wheezing or stridor. Cardio: Heart sounds are normal with regular rate and rhythm and no murmurs, rubs or gallops. Peripheral pulses are normal and equal bilaterally without edema. No aortic or femoral bruits. Chest: symmetric with normal excursions and percussion. Breasts: Symmetric, without lumps, nipple discharge, retractions, or fibrocystic changes.  Abdomen: Flat, soft with bowel sounds active. Nontender, no guarding, rebound, hernias, masses, or organomegaly.  Lymphatics: Non tender without lymphadenopathy.  Musculoskeletal: Full ROM all peripheral extremities, joint stability, 5/5 strength, and normal gait. Skin: Warm and dry without rashes, lesions, cyanosis, clubbing or  ecchymosis.  Neuro: Cranial nerves intact, reflexes equal bilaterally. Normal muscle tone, no cerebellar symptoms. Sensation intact.  Pysch: Alert and oriented X 3, normal affect, Insight and Judgment appropriate.    Assessment and Plan    1. Essential hypertension  - EKG 12-Lead - Urinalysis, Routine w reflex microscopic - Microalbumin / creatinine urine ratio - CBC with Differential/Platelet - COMPLETE METABOLIC PANEL WITH GFR - Magnesium - TSH  2. Hyperlipidemia, mixed  - EKG 12-Lead - Lipid panel - TSH  3. Abnormal glucose  - EKG 12-Lead - Hemoglobin A1c - Insulin, random  4. Vitamin D deficiency  - VITAMIN D 25 Hydroxy   5. Hypothyroidism,  - TSH  6. Gastroesophageal reflux disease   - CBC with Differential/Platelet  7. Complete right bundle branch block (RBBB)   8. Age-related osteoporosis with current pathological fracture  - COMPLETE METABOLIC PANEL WITH GFR  9. Screening for colorectal cancer   10. Screening for heart disease  - EKG 12-Lead  11. FHx: heart disease  - EKG 12-Lead  12. Medication management  - Urinalysis, Routine w reflex microscopic - Microalbumin / creatinine urine ratio - CBC with  Differential/Platelet - COMPLETE METABOLIC PANEL WITH GFR - Magnesium - Lipid panel - TSH - Hemoglobin A1c - Insulin, random - VITAMIN D 25 Hydroxy           Patient was counseled in prudent diet to achieve/maintain BMI less than 25 for weight control, BP monitoring, regular exercise and medications. Discussed med's effects and SE's. Screening labs and tests as requested with regular follow-up as recommended. Over 40 minutes of exam, counseling, chart review and high complex critical decision making was performed.   Marinus Maw, MD

## 2023-08-01 DIAGNOSIS — Z85828 Personal history of other malignant neoplasm of skin: Secondary | ICD-10-CM | POA: Diagnosis not present

## 2023-08-01 DIAGNOSIS — L821 Other seborrheic keratosis: Secondary | ICD-10-CM | POA: Diagnosis not present

## 2023-08-01 DIAGNOSIS — D225 Melanocytic nevi of trunk: Secondary | ICD-10-CM | POA: Diagnosis not present

## 2023-08-01 DIAGNOSIS — D692 Other nonthrombocytopenic purpura: Secondary | ICD-10-CM | POA: Diagnosis not present

## 2023-08-01 LAB — HEMOGLOBIN A1C
Hgb A1c MFr Bld: 5.6 %{Hb} (ref <5.7)
Mean Plasma Glucose: 114 mg/dL
eAG (mmol/L): 6.3 mmol/L

## 2023-08-01 LAB — TEST AUTHORIZATION: TEST CODE:: 63991023117306

## 2023-08-01 LAB — URINALYSIS, ROUTINE W REFLEX MICROSCOPIC
Bilirubin Urine: NEGATIVE
Glucose, UA: NEGATIVE
Hgb urine dipstick: NEGATIVE
Ketones, ur: NEGATIVE
Leukocytes,Ua: NEGATIVE
Nitrite: NEGATIVE
Protein, ur: NEGATIVE
Specific Gravity, Urine: 1.006 (ref 1.001–1.035)
pH: 6 (ref 5.0–8.0)

## 2023-08-01 LAB — LIPID PANEL
Cholesterol: 173 mg/dL (ref <200)
HDL: 75 mg/dL (ref >=50)
LDL Cholesterol (Calc): 77 mg/dL
Non-HDL Cholesterol (Calc): 98 mg/dL (ref <130)
Total CHOL/HDL Ratio: 2.3 (calc) (ref <5.0)
Triglycerides: 119 mg/dL (ref <150)

## 2023-08-01 LAB — MICROALBUMIN / CREATININE URINE RATIO
Creatinine, Urine: 39 mg/dL (ref 20–275)
Microalb, Ur: <0.2 mg/dL

## 2023-08-01 LAB — MAGNESIUM: Magnesium: 1.9 mg/dL (ref 1.5–2.5)

## 2023-08-01 LAB — TSH: TSH: 11.4 m[IU]/L — ABNORMAL HIGH (ref 0.40–4.50)

## 2023-08-01 LAB — INSULIN, RANDOM: Insulin: 19.7 u[IU]/mL — ABNORMAL HIGH

## 2023-08-01 NOTE — Progress Notes (Signed)
<>*<>*<>*<>*<>*<>*<>*<>*<>*<>*<>*<>*<>*<>*<>*<>*<>*<>*<>*<>*<>*<>*<>*<>*<> <>*<>*<>*<>*<>*<>*<>*<>*<>*<>*<>*<>*<>*<>*<>*<>*<>*<>*<>*<>*<>*<>*<>*<>*<>  -  Test results slightly outside the reference range are not unusual. If there is anything important, I will review this with you,  otherwise it is considered normal test values.  If you have further questions,  please do not hesitate to contact me at the office or via My Chart.   <>*<>*<>*<>*<>*<>*<>*<>*<>*<>*<>*<>*<>*<>*<>*<>*<>*<>*<>*<>*<>*<>*<>*<>*<> <>*<>*<>*<>*<>*<>*<>*<>*<>*<>*<>*<>*<>*<>*<>*<>*<>*<>*<>*<>*<>*<>*<>*<>*<>  - TSH is elevated which suggests that your thyroid hormone  is very low   - > Have you stopped or changed your dose or how you take it ?         Are you taking any Biotin supplements which will                                         cause incorrect Thyroid measurements like this  !                         (( Very Important that you do NOT take Biotin Supplements  !  ))      Are you taking it on an empty stomach ,                              1st thing in the morning with only water for 1 hour and                                 no Antacids, Calcium , Magnesium, Antacids or                                 medicines that affect stomach acid ?    Please call office &                Let  me know the dose of thyroid that you are taking  &                                                                                      how you take it !  Thanks  <>*<>*<>*<>*<>*<>*<>*<>*<>*<>*<>*<>*<>*<>*<>*<>*<>*<>*<>*<>*<>*<>*<>*<>*<> <>*<>*<>*<>*<>*<>*<>*<>*<>*<>*<>*<>*<>*<>*<>*<>*<>*<>*<>*<>*<>*<>*<>*<>*<>  -  A1c - Normal - No Diabetes  - Great  !  <>*<>*<>*<>*<>*<>*<>*<>*<>*<>*<>*<>*<>*<>*<>*<>*<>*<>*<>*<>*<>*<>*<>*<>*<> <>*<>*<>*<>*<>*<>*<>*<>*<>*<>*<>*<>*<>*<>*<>*<>*<>*<>*<>*<>*<>*<>*<>*<>*<>  -  Chol = 173   &  LDL = 77    Excellent   - Very low risk for Heart Attack  /  Stroke  <>*<>*<>*<>*<>*<>*<>*<>*<>*<>*<>*<>*<>*<>*<>*<>*<>*<>*<>*<>*<>*<>*<>*<>*<> <>*<>*<>*<>*<>*<>*<>*<>*<>*<>*<>*<>*<>*<>*<>*<>*<>*<>*<>*<>*<>*<>*<>*<>*<>  -  All Else - CBC - Kidneys - Electrolytes - Liver b &  Magnesium - all  Normal / OK  <>*<>*<>*<>*<>*<>*<>*<>*<>*<>*<>*<>*<>*<>*<>*<>*<>*<>*<>*<>*<>*<>*<>*<>*<> <>*<>*<>*<>*<>*<>*<>*<>*<>*<>*<>*<>*<>*<>*<>*<>*<>*<>*<>*<>*<>*<>*<>*<>*<>

## 2023-08-05 DIAGNOSIS — Z23 Encounter for immunization: Secondary | ICD-10-CM | POA: Diagnosis not present

## 2023-08-12 ENCOUNTER — Encounter: Payer: Self-pay | Admitting: Nurse Practitioner

## 2023-08-12 ENCOUNTER — Ambulatory Visit (INDEPENDENT_AMBULATORY_CARE_PROVIDER_SITE_OTHER): Payer: Medicare Other | Admitting: Nurse Practitioner

## 2023-08-12 VITALS — BP 128/74 | HR 76 | Temp 97.9°F | Ht 63.5 in | Wt 111.2 lb

## 2023-08-12 DIAGNOSIS — R42 Dizziness and giddiness: Secondary | ICD-10-CM | POA: Diagnosis not present

## 2023-08-12 DIAGNOSIS — E039 Hypothyroidism, unspecified: Secondary | ICD-10-CM | POA: Diagnosis not present

## 2023-08-12 DIAGNOSIS — I10 Essential (primary) hypertension: Secondary | ICD-10-CM | POA: Diagnosis not present

## 2023-08-12 MED ORDER — MECLIZINE HCL 25 MG PO TABS
ORAL_TABLET | ORAL | 0 refills | Status: AC
Start: 2023-08-12 — End: ?

## 2023-08-12 NOTE — Patient Instructions (Signed)
Vertigo Vertigo is the feeling that you or the things around you are moving or spinning when they're not. It's different than feeling dizzy. It can also cause: Loss of balance. Trouble standing or walking. Nausea and vomiting. This feeling can come and go at any time. It can last from a few seconds to minutes or even hours. It may go away on its own or be treated with medicine. What are the types of vertigo? There are two types of vertigo: Peripheral vertigo happens when parts of your inner ear don't work like they should. This is the more common type. Central vertigo happens when your brain and spinal cord don't work like they should. Your health care provider will do tests to find out what kind of vertigo you have. This will help them decide on the right treatment for you. Follow these instructions at home: Eating and drinking Drink enough fluid to keep your pee (urine) pale yellow. Do not drink alcohol. Activity When you get up in the morning, first sit up on the side of the bed. When you feel okay, stand slowly while holding onto something. Move slowly. Avoid sudden body or head movements. Avoid certain positions, as told by your provider. Use a cane if you have trouble standing or walking. Sit down right away if you feel unsteady. Place items in your home so they're easy for you to reach without bending or leaning over. Return to normal activities when you're told. Ask what things are safe for you to do. General instructions Take your medicines only as told by your provider. Contact a health care provider if: Your medicines don't help or make your vertigo worse. You get new symptoms. You have a fever. You have nausea or vomiting. Your family or friends spot any changes in how you're acting. A part of your body goes numb. You feel tingling and prickling in a part of your body. You get very bad headaches. Get help right away if: You're always dizzy or you faint. You have a  stiff neck. You have trouble moving or speaking. Your hands, arms, or legs feel weak. Your hearing or eyesight changes. These symptoms may be an emergency. Call 911 right away. Do not wait to see if the symptoms will go away. Do not drive yourself to the hospital. This information is not intended to replace advice given to you by your health care provider. Make sure you discuss any questions you have with your health care provider. Document Revised: 02/08/2023 Document Reviewed: 02/08/2023 Elsevier Patient Education  2024 [employer].

## 2023-08-12 NOTE — Progress Notes (Signed)
Assessment and Plan:  Diagnoses and all orders for this visit:  Essential hypertension - continue medications, DASH diet, exercise and monitor at home. Call if greater than 130/80.    Vertigo Change position slowly and use Meclizine as needed Continue to push fluids If symptoms worsen or are not improved by next week notify the office -     meclizine (ANTIVERT) 25 MG tablet; 1/2-1 pill up to 3 times daily for motion sickness/dizziness  Hypothyroidism Continue Levothyroxine 125 mcg 1 tab daily except Wednesday take 2 pills.  Will recheck TSH at next visit     Further disposition pending results of labs. Discussed med's effects and SE's.   Over 30 minutes of exam, counseling, chart review, and critical decision making was performed.   Future Appointments  Date Time Provider Department Center  09/12/2023  1:10 PM [name], PA-C [facility] None  09/23/2023 10:30 AM Raynelle Dick, NP [facility] None  10/22/2023  1:30 PM [name], DO [facility] None  12/26/2023 10:30 AM Lucky Cowboy, MD [facility] None  03/30/2024 10:30 AM Raynelle Dick, NP [facility] None  04/28/2024 10:00 AM [name], DO [facility]  08/06/2024 10:00 AM Lucky Cowboy, MD [facility] None    ------------------------------------------------------------------------------------------------------------------   HPI BP 128/74   Pulse 76   Temp 97.9 F (36.6 C)   Ht 5' 3.5" (1.613 m)   Wt 111 lb 3.2 oz (50.4 kg)   SpO2 98%   BMI 19.39 kg/m   82 y.o.female presents for more severe dizziness x 3 days, will break out into sweats intermittently. She had a bout of colitis and was restarted on Vancomycin and prednisone on 07/29/23.  Describes as the room spinning when changing position.  Dizziness is not worse when lying flat.   She is trying to stay well hydrated.  Currently diarrhea is gone.  Lab Results  Component Value Date   EGFR 77 06/18/2023     BP is currently well controlled  with bisoprolol 5 mg every day  BP Readings from Last 3 Encounters:  08/12/23 128/74  07/31/23 130/78  06/18/23 126/68   BMI is Body mass index is 19.39 kg/m., she has not been working on diet and exercise. Wt Readings from Last 3 Encounters:  08/12/23 111 lb 3.2 oz (50.4 kg)  07/31/23 111 lb 3.2 oz (50.4 kg)  06/18/23 111 lb 9.6 oz (50.6 kg)   Taking Levothyroxine 125 mcg daily except Wednesdays takes 2 tabs.  Lab Results  Component Value Date   TSH 11.40 (H) 07/31/2023   T3TOTAL 62 (L) 10/22/2019    Past Medical History:  Diagnosis Date   Arthritis    Benign labile hypertension    Cancer (HCC)    skin cancer on face   Colitis    DJD (degenerative joint disease)    Endometrial polyp    GERD (gastroesophageal reflux disease)    History of basal cell carcinoma excision    History of esophagitis    HOH (hard of hearing)    right ear better than left  -- wears no aides   Hyperlipidemia    Hypothyroidism    Neuromuscular disorder (HCC)    neuropathy in feet   Osteopenia    Prediabetes    S/P right hip fracture 08/17/2020   Sepsis (HCC) 2023   per patient   Vitamin D deficiency    Wears glasses      Allergies  Allergen Reactions   Remicade [Infliximab]    Fosamax [Alendronate  Sodium] Other (See Comments)    GI upset    Singulair [Montelukast Sodium] Other (See Comments)    Makes pt jittery   Wellbutrin [Bupropion] Hives   Clindamycin/Lincomycin Rash   Sulfa Antibiotics Rash    Current Outpatient Medications on File Prior to Visit  Medication Sig   acetaminophen (TYLENOL) 500 MG tablet Take 500 mg by mouth at bedtime as needed for moderate pain or headache.    Ascorbic Acid (VITAMIN C) 1000 MG tablet Take 1,000 mg by mouth daily.    aspirin EC 81 MG tablet Take 1 tablet (81 mg total) by mouth in the morning and at bedtime. (Patient taking differently: Take 81 mg by mouth daily.)   bisoprolol (ZEBETA) 5 MG tablet Take 1 tablet every Morning for BP   Calcium  Carbonate-Vit D-Min (CALCIUM 1200 PO) Take 1,200 mg by mouth daily.   CINNAMON PO Take 2,000 mg by mouth daily.   Coenzyme Q10 (COQ10) 200 MG CAPS Take 200 mg by mouth daily.   COLESTIPOL HCL PO Take by mouth. 2 tabs BID   cycloSPORINE (RESTASIS) 0.05 % ophthalmic emulsion Place 1 drop into both eyes 2 (two) times daily.   Fluticasone Propionate (FLONASE NA) Place into the nose.   levothyroxine (SYNTHROID) 125 MCG tablet TAKE 1 TABLET BY MOUTH DAILY ON AN EMPTY STOMACH WITH ONLY WATER FOR 30 MINUTES(NO ANTACIDS MEDICATIONS, CALCIUM OR MAGNESIUM FOR 4 HOURS) (Patient taking differently: Take 125 mcg by mouth daily before breakfast.)   loperamide (IMODIUM) 2 MG capsule Take 4 mg by mouth as needed.   loratadine (CLARITIN) 10 MG tablet Take 10 mg by mouth daily as needed for allergies.   Mesalamine (ASACOL PO) Take by mouth. Takes 2 tablets, BID   Multiple Vitamin (THERA VITAMIN PO) Take 1 tablet by mouth daily.   Omega-3 Fatty Acids (FISH OIL) 1200 MG CAPS Take 1,200 mg by mouth daily.    pantoprazole (PROTONIX) 40 MG tablet Take 1 tablet  2 x /day  for Heartburn & Indigestion (Patient taking differently: Take 40 mg by mouth 2 (two) times daily.)   predniSONE (DELTASONE) 10 MG tablet Take 1 tablet by mouth daily.   Probiotic Product (PROBIOTIC PO) Take 1 capsule by mouth daily.   PROLIA 60 MG/ML SOSY injection Inject 60 mg into the skin every 6 (six) months.   pseudoephedrine (SUDAFED) 120 MG 12 hr tablet Take  1 tablet  2 x /day (every 12 hours)  for Head and Chest Congestion (Patient taking differently: Take 120 mg by mouth daily as needed for congestion (Head and chest congestion).)   simvastatin (ZOCOR) 20 MG tablet Take 1 tablet at Bedtime for Cholesterol   STELARA 45 MG/0.5ML SOLN Inject into the skin.   vancomycin (VANCOCIN) 125 MG capsule Take by mouth.   No current facility-administered medications on file prior to visit.    ROS: all negative except above.   Physical Exam:  BP  128/74   Pulse 76   Temp 97.9 F (36.6 C)   Ht 5' 3.5" (1.613 m)   Wt 111 lb 3.2 oz (50.4 kg)   SpO2 98%   BMI 19.39 kg/m   General Appearance:Thin pleasant female , in no apparent distress. Eyes: PERRLA, EOMs, conjunctiva no swelling or erythema Sinuses: No Frontal/maxillary tenderness ENT/Mouth: Ext aud canals clear, TMs without erythema, bulging. No erythema, swelling, or exudate on post pharynx.  Marland Kitchen Hearing normal.  Neck: Supple, thyroid normal.  Respiratory: Respiratory effort normal, BS equal bilaterally without rales, rhonchi,  wheezing or stridor.  Cardio: RRR with no MRGs. Brisk peripheral pulses without edema.  Abdomen: Soft, + BS.  Non tender, no guarding, rebound, hernias, masses. Lymphatics: Non tender without lymphadenopathy.  Musculoskeletal: Full ROM, 5/5 strength, normal gait.  Skin: Warm, dry without rashes, lesions, ecchymosis.  Neuro: Cranial nerves intact. Normal muscle tone.Sensation intact. Heel/toe walk normal. Normal fast alternating movements. Normal fingertip to nose.  Psych: Awake and oriented X 3, normal affect, Insight and Judgment appropriate.     Raynelle Dick, NP 4:13 PM [facility]

## 2023-08-13 DIAGNOSIS — M542 Cervicalgia: Secondary | ICD-10-CM | POA: Diagnosis not present

## 2023-08-19 ENCOUNTER — Institutional Professional Consult (permissible substitution): Admit: 2023-08-19 | Discharge: 2023-08-20 | Payer: MEDICARE | Attending: Internal Medicine | Primary: Internal Medicine

## 2023-08-19 DIAGNOSIS — K52839 Microscopic colitis, unspecified: Secondary | ICD-10-CM | POA: Diagnosis not present

## 2023-08-19 MED ORDER — VANCOMYCIN 125 MG CAPSULE
ORAL_CAPSULE | Freq: Two times a day (BID) | ORAL | 0 refills | 30 days | Status: CP
Start: 2023-08-19 — End: ?

## 2023-08-21 DIAGNOSIS — K5100 Ulcerative (chronic) pancolitis without complications: Principal | ICD-10-CM

## 2023-08-21 MED ORDER — VELSIPITY 2 MG TABLET
ORAL_TABLET | Freq: Every day | ORAL | 2 refills | 0 days | Status: CP
Start: 2023-08-21 — End: ?
  Filled 2023-09-17: qty 30, 30d supply, fill #0

## 2023-08-21 NOTE — Unmapped (Signed)
Update: 09/18/23  Velsipity scheduled for delivery today.  Referral to CPP completed.  Safety labs due in 3 months.  Message routed to pt with update.      Update: 09/12/23  Etrasimod (Velsipity) 2 mg has been authorized  EKG and Eye exam completed - uploaded to Media.  Skin checks annually by Derm  Cleared to start drug per Dr. Jacqulyn Bath      08/21/23  MEDICATION ORDER ENTRY DOCUMENTATION    The following medication(s) have been sent to pharmacy for insurance authorization:    Medication: Etrasimod  Dose & Frequency: 2 mg daily  Pharmacy: [facility]   TB - 03/29/22  Hep B - 03/29/22    Requesting Physician: Dr. Loreli Dollar    Notes: Failed Stelara. Refer to progress notes from 08/19/23  Baseline tests:  EKG - up to date  CBC, CMP up to date  Shingrix - up to date  eye exam, skin check - will need        Ernestine Mcmurray, RN  IBD Clinical Coordinator  [phone]

## 2023-08-22 DIAGNOSIS — K5100 Ulcerative (chronic) pancolitis without complications: Principal | ICD-10-CM

## 2023-08-23 DIAGNOSIS — K5100 Ulcerative (chronic) pancolitis without complications: Principal | ICD-10-CM

## 2023-08-26 NOTE — Progress Notes (Unsigned)
Assessment and Plan:  There are no diagnoses linked to this encounter.    Further disposition pending results of labs. Discussed med's effects and SE's.   Over 30 minutes of exam, counseling, chart review, and critical decision making was performed.   Future Appointments  Date Time Provider Department Center  08/27/2023  2:30 PM Raynelle Dick, NP [facility] None  09/12/2023  1:10 PM [name], PA-C [facility] None  09/23/2023 10:30 AM Raynelle Dick, NP [facility] None  10/22/2023  1:30 PM [name], DO [facility] None  12/26/2023 10:30 AM Lucky Cowboy, MD [facility] None  03/30/2024 10:30 AM Raynelle Dick, NP [facility] None  08/06/2024 10:00 AM Lucky Cowboy, MD [facility] None    ------------------------------------------------------------------------------------------------------------------   HPI There were no vitals taken for this visit. 82 y.o.female presents for  Past Medical History:  Diagnosis Date   Arthritis    Benign labile hypertension    Cancer (HCC)    skin cancer on face   Colitis    DJD (degenerative joint disease)    Endometrial polyp    GERD (gastroesophageal reflux disease)    History of basal cell carcinoma excision    History of esophagitis    HOH (hard of hearing)    right ear better than left  -- wears no aides   Hyperlipidemia    Hypothyroidism    Neuromuscular disorder (HCC)    neuropathy in feet   Osteopenia    Prediabetes    S/P right hip fracture 08/17/2020   Sepsis (HCC) 2023   per patient   Vitamin D deficiency    Wears glasses      Allergies  Allergen Reactions   Remicade [Infliximab]    Fosamax [Alendronate Sodium] Other (See Comments)    GI upset    Singulair [Montelukast Sodium] Other (See Comments)    Makes pt jittery   Wellbutrin [Bupropion] Hives   Clindamycin/Lincomycin Rash   Sulfa Antibiotics Rash    Current Outpatient Medications on File Prior to Visit  Medication Sig   acetaminophen  (TYLENOL) 500 MG tablet Take 500 mg by mouth at bedtime as needed for moderate pain or headache.    Ascorbic Acid (VITAMIN C) 1000 MG tablet Take 1,000 mg by mouth daily.    aspirin EC 81 MG tablet Take 1 tablet (81 mg total) by mouth in the morning and at bedtime. (Patient taking differently: Take 81 mg by mouth daily.)   bisoprolol (ZEBETA) 5 MG tablet Take 1 tablet every Morning for BP   Calcium Carbonate-Vit D-Min (CALCIUM 1200 PO) Take 1,200 mg by mouth daily.   CINNAMON PO Take 2,000 mg by mouth daily.   Coenzyme Q10 (COQ10) 200 MG CAPS Take 200 mg by mouth daily.   COLESTIPOL HCL PO Take by mouth. 2 tabs BID   cycloSPORINE (RESTASIS) 0.05 % ophthalmic emulsion Place 1 drop into both eyes 2 (two) times daily.   Fluticasone Propionate (FLONASE NA) Place into the nose.   levothyroxine (SYNTHROID) 125 MCG tablet TAKE 1 TABLET BY MOUTH DAILY ON AN EMPTY STOMACH WITH ONLY WATER FOR 30 MINUTES(NO ANTACIDS MEDICATIONS, CALCIUM OR MAGNESIUM FOR 4 HOURS) (Patient taking differently: Take 125 mcg by mouth daily before breakfast.)   loperamide (IMODIUM) 2 MG capsule Take 4 mg by mouth as needed.   loratadine (CLARITIN) 10 MG tablet Take 10 mg by mouth daily as needed for allergies.   meclizine (ANTIVERT) 25 MG tablet 1/2-1 pill up to 3 times daily for motion sickness/dizziness  Mesalamine (ASACOL PO) Take by mouth. Takes 2 tablets, BID   Multiple Vitamin (THERA VITAMIN PO) Take 1 tablet by mouth daily.   Omega-3 Fatty Acids (FISH OIL) 1200 MG CAPS Take 1,200 mg by mouth daily.    pantoprazole (PROTONIX) 40 MG tablet Take 1 tablet  2 x /day  for Heartburn & Indigestion (Patient taking differently: Take 40 mg by mouth 2 (two) times daily.)   predniSONE (DELTASONE) 10 MG tablet Take 1 tablet by mouth daily.   Probiotic Product (PROBIOTIC PO) Take 1 capsule by mouth daily.   PROLIA 60 MG/ML SOSY injection Inject 60 mg into the skin every 6 (six) months.   pseudoephedrine (SUDAFED) 120 MG 12 hr tablet  Take  1 tablet  2 x /day (every 12 hours)  for Head and Chest Congestion (Patient taking differently: Take 120 mg by mouth daily as needed for congestion (Head and chest congestion).)   simvastatin (ZOCOR) 20 MG tablet Take 1 tablet at Bedtime for Cholesterol   STELARA 45 MG/0.5ML SOLN Inject into the skin.   vancomycin (VANCOCIN) 125 MG capsule Take by mouth.   No current facility-administered medications on file prior to visit.    ROS: all negative except above.   Physical Exam:  There were no vitals taken for this visit.  General Appearance: Well nourished, in no apparent distress. Eyes: PERRLA, EOMs, conjunctiva no swelling or erythema Sinuses: No Frontal/maxillary tenderness ENT/Mouth: Ext aud canals clear, TMs without erythema, bulging. No erythema, swelling, or exudate on post pharynx.  Tonsils not swollen or erythematous. Hearing normal.  Neck: Supple, thyroid normal.  Respiratory: Respiratory effort normal, BS equal bilaterally without rales, rhonchi, wheezing or stridor.  Cardio: RRR with no MRGs. Brisk peripheral pulses without edema.  Abdomen: Soft, + BS.  Non tender, no guarding, rebound, hernias, masses. Lymphatics: Non tender without lymphadenopathy.  Musculoskeletal: Full ROM, 5/5 strength, normal gait.  Skin: Warm, dry without rashes, lesions, ecchymosis.  Neuro: Cranial nerves intact. Normal muscle tone, no cerebellar symptoms. Sensation intact.  Psych: Awake and oriented X 3, normal affect, Insight and Judgment appropriate.     Raynelle Dick, NP 4:13 PM [facility]

## 2023-08-27 ENCOUNTER — Ambulatory Visit (INDEPENDENT_AMBULATORY_CARE_PROVIDER_SITE_OTHER): Payer: Medicare Other | Admitting: Nurse Practitioner

## 2023-08-27 ENCOUNTER — Encounter: Payer: Self-pay | Admitting: Nurse Practitioner

## 2023-08-27 VITALS — BP 118/70 | HR 77 | Temp 97.9°F | Ht 63.5 in | Wt 114.0 lb

## 2023-08-27 DIAGNOSIS — K644 Residual hemorrhoidal skin tags: Secondary | ICD-10-CM

## 2023-08-27 DIAGNOSIS — K5100 Ulcerative (chronic) pancolitis without complications: Secondary | ICD-10-CM

## 2023-08-27 DIAGNOSIS — I10 Essential (primary) hypertension: Secondary | ICD-10-CM

## 2023-08-27 NOTE — Patient Instructions (Signed)
Hemorrhoids Hemorrhoids are swollen veins that may form: In the butt (rectum). These are called internal hemorrhoids. Around the opening of the butt (anus). These are called external hemorrhoids. Most hemorrhoids do not cause very bad problems. They often get better with changes to your lifestyle and what you eat. What are the causes? Having trouble pooping (constipation) or watery poop (diarrhea). Pushing too hard when you poop. Pregnancy. Being very overweight (obese). Sitting for too long. Riding a bike for a long time. Heavy lifting or other things that take a lot of effort. Anal sex. What are the signs or symptoms? Pain. Itching or soreness in the butt. Bleeding from the butt. Leaking poop. Swelling. One or more lumps around the opening of your butt. How is this treated? In most cases, hemorrhoids can be treated at home. You may be told to: Change what you eat. Make changes to your lifestyle. If these treatments do not help, you may need to have a procedure done. Your doctor may need to: Place rubber bands at the bottom of the hemorrhoids to make them fall off. Put medicine into the hemorrhoids to shrink them. Shine a type of light on the hemorrhoids to cause them to fall off. Do surgery to get rid of the hemorrhoids. Follow these instructions at home: Medicines Take over-the-counter and prescription medicines only as told by your doctor. Use creams with medicine in them or medicines that you put in your butt as told by your doctor. Eating and drinking  Eat foods that have a lot of fiber in them. These include whole grains, beans, nuts, fruits, and vegetables. Ask your doctor about taking products that have fiber added to them (fibersupplements). Take in less fat. You can do this by: Eating low-fat dairy products. Eating less red meat. Staying away from processed foods. Drink enough fluid to keep your pee (urine) pale yellow. Managing pain and swelling  Take a  warm-water bath (sitz bath) for 20 minutes to ease pain. Do this 3-4 times a day. You may do this in a bathtub. You may also use a portable sitz bath that fits over the toilet. If told, put ice on the painful area. It may help to use ice between your warm baths. Put ice in a plastic bag. Place a towel between your skin and the bag. Leave the ice on for 20 minutes, 2-3 times a day. If your skin turns bright red, take off the ice right away to prevent skin damage. The risk of damage is higher if you cannot feel pain, heat, or cold. General instructions Exercise. Ask your doctor how much and what kind of exercise is best for you. Go to the bathroom when you need to poop. Do not wait. Try not to push too hard when you poop. Keep your butt dry and clean. Use wet toilet paper or moist towelettes after you poop. Do not sit on the toilet for a long time. Contact a doctor if: You have pain and swelling that do not get better with treatment. You have trouble pooping. You cannot poop. You have pain or swelling outside the area of the hemorrhoids. Get help right away if: You have bleeding from the butt that will not stop. This information is not intended to replace advice given to you by your health care provider. Make sure you discuss any questions you have with your health care provider. Document Revised: 07/18/2022 Document Reviewed: 07/18/2022 Elsevier Patient Education  2024 [employer]  

## 2023-08-27 NOTE — Progress Notes (Signed)
Assessment and Plan:  Debbie Houston was seen today for acute visit.  Diagnoses and all orders for this visit:  Essential hypertension - continue medications, DASH diet, exercise and monitor at home. Call if greater than 130/80.   External hemorrhoid Use Witch Hazel wipes or preparation H Monitor  Ulcerative Pancolitis - Follows with GI in [place]- to start on new drug , Velsipity and needed UTD EKG which was just done 07/31/23- needs to get eye exam before starting  Further disposition pending results of labs. Discussed med's effects and SE's.   Over 30 minutes of exam, counseling, chart review, and critical decision making was performed.   Future Appointments  Date Time Provider Department Center  09/12/2023  1:10 PM [name], PA-C [facility] None  09/23/2023 10:30 AM Raynelle Dick, NP [facility] None  10/22/2023  1:30 PM [name], DO [facility] None  12/26/2023 10:30 AM Lucky Cowboy, MD [facility] None  03/30/2024 10:30 AM Raynelle Dick, NP [facility] None  08/06/2024 10:00 AM Lucky Cowboy, MD [facility] None    ------------------------------------------------------------------------------------------------------------------   HPI BP 118/70   Pulse 77   Temp 97.9 F (36.6 C)   Ht 5' 3.5" (1.613 m)   Wt 114 lb (51.7 kg)   SpO2 95%   BMI 19.88 kg/m   82 y.o.female presents for swollen bottom, uncertain if hemorrhoids. Uncertain how long this has been present.  Denies bleeding, itching and pain   Follows with GI in [facility] for pancolitis.  Still having persistent diarrhea- to start on new drug , Velsipity and needed UTD EKG which was just done 07/31/23- needs to get eye exam before starting  BP well controlled with Bisoprolol 5 mg every day  BP Readings from Last 3 Encounters:  08/27/23 118/70  08/12/23 128/74  07/31/23 130/78  Denies headaches, chest pain, shortness of breath and dizziness   BMI is Body mass index is 19.88 kg/m., she has been  working on diet and exercise. Wt Readings from Last 3 Encounters:  08/27/23 114 lb (51.7 kg)  08/12/23 111 lb 3.2 oz (50.4 kg)  07/31/23 111 lb 3.2 oz (50.4 kg)      Past Medical History:  Diagnosis Date   Arthritis    Benign labile hypertension    Cancer (HCC)    skin cancer on face   Colitis    DJD (degenerative joint disease)    Endometrial polyp    GERD (gastroesophageal reflux disease)    History of basal cell carcinoma excision    History of esophagitis    HOH (hard of hearing)    right ear better than left  -- wears no aides   Hyperlipidemia    Hypothyroidism    Neuromuscular disorder (HCC)    neuropathy in feet   Osteopenia    Prediabetes    S/P right hip fracture 08/17/2020   Sepsis (HCC) 2023   per patient   Vitamin D deficiency    Wears glasses      Allergies  Allergen Reactions   Remicade [Infliximab]    Fosamax [Alendronate Sodium] Other (See Comments)    GI upset    Singulair [Montelukast Sodium] Other (See Comments)    Makes pt jittery   Wellbutrin [Bupropion] Hives   Clindamycin/Lincomycin Rash   Sulfa Antibiotics Rash    Current Outpatient Medications on File Prior to Visit  Medication Sig   acetaminophen (TYLENOL) 500 MG tablet Take 500 mg by mouth at bedtime as needed for moderate pain or  headache.    Ascorbic Acid (VITAMIN C) 1000 MG tablet Take 1,000 mg by mouth daily.    aspirin EC 81 MG tablet Take 1 tablet (81 mg total) by mouth in the morning and at bedtime. (Patient taking differently: Take 81 mg by mouth daily.)   bisoprolol (ZEBETA) 5 MG tablet Take 1 tablet every Morning for BP   Calcium Carbonate-Vit D-Min (CALCIUM 1200 PO) Take 1,200 mg by mouth daily.   CINNAMON PO Take 2,000 mg by mouth daily.   Coenzyme Q10 (COQ10) 200 MG CAPS Take 200 mg by mouth daily.   COLESTIPOL HCL PO Take by mouth. 2 tabs BID   cycloSPORINE (RESTASIS) 0.05 % ophthalmic emulsion Place 1 drop into both eyes 2 (two) times daily.   Fluticasone Propionate  (FLONASE NA) Place into the nose.   levothyroxine (SYNTHROID) 125 MCG tablet TAKE 1 TABLET BY MOUTH DAILY ON AN EMPTY STOMACH WITH ONLY WATER FOR 30 MINUTES(NO ANTACIDS MEDICATIONS, CALCIUM OR MAGNESIUM FOR 4 HOURS) (Patient taking differently: Take 125 mcg by mouth daily before breakfast.)   loperamide (IMODIUM) 2 MG capsule Take 4 mg by mouth as needed.   loratadine (CLARITIN) 10 MG tablet Take 10 mg by mouth daily as needed for allergies.   meclizine (ANTIVERT) 25 MG tablet 1/2-1 pill up to 3 times daily for motion sickness/dizziness   Mesalamine (ASACOL PO) Take by mouth. Takes 2 tablets, BID   Multiple Vitamin (THERA VITAMIN PO) Take 1 tablet by mouth daily.   Omega-3 Fatty Acids (FISH OIL) 1200 MG CAPS Take 1,200 mg by mouth daily.    pantoprazole (PROTONIX) 40 MG tablet Take 1 tablet  2 x /day  for Heartburn & Indigestion (Patient taking differently: Take 40 mg by mouth 2 (two) times daily.)   Probiotic Product (PROBIOTIC PO) Take 1 capsule by mouth daily.   PROLIA 60 MG/ML SOSY injection Inject 60 mg into the skin every 6 (six) months.   pseudoephedrine (SUDAFED) 120 MG 12 hr tablet Take  1 tablet  2 x /day (every 12 hours)  for Head and Chest Congestion (Patient taking differently: Take 120 mg by mouth daily as needed for congestion (Head and chest congestion).)   simvastatin (ZOCOR) 20 MG tablet Take 1 tablet at Bedtime for Cholesterol   STELARA 45 MG/0.5ML SOLN Inject into the skin.   vancomycin (VANCOCIN) 125 MG capsule Take by mouth.   predniSONE (DELTASONE) 10 MG tablet Take 1 tablet by mouth daily. (Patient not taking: Reported on 08/27/2023)   No current facility-administered medications on file prior to visit.    ROS: all negative except above.   Physical Exam:  BP 118/70   Pulse 77   Temp 97.9 F (36.6 C)   Ht 5' 3.5" (1.613 m)   Wt 114 lb (51.7 kg)   SpO2 95%   BMI 19.88 kg/m   General Appearance: Well nourished, in no apparent distress. Eyes: PERRLA, EOMs,  conjunctiva no swelling or erythema Respiratory: Respiratory effort normal, BS equal bilaterally without rales, rhonchi, wheezing or stridor.  Cardio: RRR with no MRGs. Brisk peripheral pulses without edema.  Abdomen: Soft, + BS.   Musculoskeletal: Full ROM, 5/5 strength, normal gait.  Skin: Warm, dry without rashes, lesions, ecchymosis.  RECTAL: normal rectal, no masses, external hemorrhoids non-thrombosed. Psych: Awake and oriented X 3, normal affect, Insight and Judgment appropriate.     Raynelle Dick, NP 2:51 PM [facility]

## 2023-08-29 NOTE — Progress Notes (Deleted)
Office Visit Note  Patient: Debbie Houston             Date of Birth: [birth year]           MRN: [id number]             PCP: Lucky Cowboy, MD Referring: Lucky Cowboy, MD Visit Date: 09/12/2023 Occupation: @GUAROCC @  Subjective:    History of Present Illness: Debbie Houston is a 82 y.o. female with history of positive ANA and osteoarthritis.     Activities of Daily Living:  Patient reports morning stiffness for *** {minute/hour:19697}.   Patient {ACTIONS;DENIES/REPORTS:21021675::"Denies"} nocturnal pain.  Difficulty dressing/grooming: {ACTIONS;DENIES/REPORTS:21021675::"Denies"} Difficulty climbing stairs: {ACTIONS;DENIES/REPORTS:21021675::"Denies"} Difficulty getting out of chair: {ACTIONS;DENIES/REPORTS:21021675::"Denies"} Difficulty using hands for taps, buttons, cutlery, and/or writing: {ACTIONS;DENIES/REPORTS:21021675::"Denies"}  No Rheumatology ROS completed.   PMFS History:  Patient Active Problem List   Diagnosis Date Noted   Sepsis secondary to UTI (HCC) 06/24/2022   Extensor tenosynovitis of right wrist 06/23/2022   Thrombocytopenia (HCC) 06/23/2022   Prediabetes 06/23/2022   Intertrochanteric fracture of right femur, closed, with nonunion, subsequent encounter 01/22/2022   History of ulcerative colitis 07/10/2021   Complete right bundle branch block (RBBB) 05/15/2021   Body mass index (BMI) of 19.0-19.9 in adult 05/12/2021   Bilateral nephrolithiasis 09/21/2020   Senile purpura (HCC) 08/17/2020   Bilateral temporomandibular joint pain 06/15/2020   Spondylolisthesis at L4-L5 level 11/25/2018   Essential hypertension    Hyperlipidemia, mixed    Hypothyroid    GERD    Asthma    DJD (degenerative joint disease)    Vitamin D deficiency    Osteoporosis with current pathological fracture    Allergy     Past Medical History:  Diagnosis Date   Arthritis    Benign labile hypertension    Cancer (HCC)    skin cancer on face   Colitis    DJD  (degenerative joint disease)    Endometrial polyp    GERD (gastroesophageal reflux disease)    History of basal cell carcinoma excision    History of esophagitis    HOH (hard of hearing)    right ear better than left  -- wears no aides   Hyperlipidemia    Hypothyroidism    Neuromuscular disorder (HCC)    neuropathy in feet   Osteopenia    Prediabetes    S/P right hip fracture 08/17/2020   Sepsis (HCC) 2023   per patient   Vitamin D deficiency    Wears glasses     Family History  Problem Relation Age of Onset   Hypertension Mother    Cancer Mother        COLON   Hyperlipidemia Mother    Cancer Father        BREAST WITH BRAIN METS   Hyperlipidemia Father    Cancer Sister        COLON (TEENS), BREAST (50)   Hyperlipidemia Sister    Diabetes Brother    Hyperlipidemia Brother    Hypertension Brother    Colon cancer Brother 34   Cancer Maternal Grandmother        RENAL   Hyperlipidemia Maternal Grandmother    Diabetes Maternal Grandfather    Stroke Maternal Grandfather    Hyperlipidemia Maternal Grandfather    Hyperlipidemia Paternal Grandmother    Hyperlipidemia Paternal Grandfather    Autism Son    Colitis Son    Autism spectrum disorder Son    Healthy Son    Past Surgical History:  Procedure Laterality Date   BACK SURGERY  2020   CATARACT EXTRACTION, BILATERAL Bilateral 2020   CONVERSION TO TOTAL HIP Right 01/22/2022   Procedure: CONVERSION TO TOTAL HIP;  Surgeon: Joen Laura, MD;  Location: [facility];  Service: Orthopedics;  Laterality: Right;   DILATATION & CURETTAGE/HYSTEROSCOPY WITH MYOSURE N/A 10/27/2014   Procedure: DILATATION & CURETTAGE/HYSTEROSCOPY WITH MYOSURE;  Surgeon: Juluis Mire, MD;  Location: [facility] [place];  Service: Gynecology;  Laterality: N/A;   DILATION AND CURETTAGE OF UTERUS     HIP SURGERY Right 04/2020   rod and screw in right hip    MOHS SURGERY  2005   LEFT NASAL BRIDGE FOR BASAL CELL   TONSILLECTOMY  age 20    Social History   Social History Narrative   Are you right handed or left handed? Left Handed    Are you currently employed ? No   What is your current occupation? No. Use to teacher [employer]    Do you live at home alone? No   Who lives with you? Lives with husband    What type of home do you live in: 1 story or 2 story? One story home              Immunization History  Administered Date(s) Administered   Influenza, High Dose Seasonal PF 07/23/2014, 08/23/2015, 07/10/2016, 08/27/2017, 08/22/2018, 08/25/2019, 08/30/2020, 08/21/2021, 08/21/2022, 08/05/2023   Moderna SARS-COV2 Booster Vaccination 08/21/2022   PFIZER(Purple Top)SARS-COV-2 Vaccination 12/10/2019, 12/31/2019, 08/16/2020, 04/14/2021   Pneumococcal Conjugate-13 05/05/2015   Pneumococcal-Unspecified 08/22/2011   Tdap 08/22/2011   Unspecified SARS-COV-2 Vaccination 08/05/2023   Zoster Recombinant(Shingrix) 09/13/2017, 01/01/2018   Zoster, Live 06/20/2006     Objective: Vital Signs: There were no vitals taken for this visit.   Physical Exam Vitals and nursing note reviewed.  Constitutional:      Appearance: She is well-developed.  HENT:     Head: Normocephalic and atraumatic.  Eyes:     Conjunctiva/sclera: Conjunctivae normal.  Cardiovascular:     Rate and Rhythm: Normal rate and regular rhythm.     Heart sounds: Normal heart sounds.  Pulmonary:     Effort: Pulmonary effort is normal.     Breath sounds: Normal breath sounds.  Abdominal:     General: Bowel sounds are normal.     Palpations: Abdomen is soft.  Musculoskeletal:     Cervical back: Normal range of motion.  Lymphadenopathy:     Cervical: No cervical adenopathy.  Skin:    General: Skin is warm and dry.     Capillary Refill: Capillary refill takes less than 2 seconds.  Neurological:     Mental Status: She is alert and oriented to person, place, and time.  Psychiatric:        Behavior: Behavior normal.      Musculoskeletal Exam:  ***  CDAI Exam: CDAI Score: -- Patient Global: --; Provider Global: -- Swollen: --; Tender: -- Joint Exam 09/12/2023   No joint exam has been documented for this visit   There is currently no information documented on the homunculus. Go to the Rheumatology activity and complete the homunculus joint exam.  Investigation: No additional findings.  Imaging: No results found.  Recent Labs: Lab Results  Component Value Date   WBC 6.2 06/18/2023   HGB 12.3 06/18/2023   PLT 205 06/18/2023   NA 132 (L) 06/18/2023   K 4.6 06/18/2023   CL 99 06/18/2023   CO2 25 06/18/2023   GLUCOSE 91  06/18/2023   BUN 15 06/18/2023   CREATININE 0.77 06/18/2023   BILITOT 0.4 06/18/2023   ALKPHOS 42 06/22/2022   AST 20 06/18/2023   ALT 15 06/18/2023   PROT 6.0 (L) 06/18/2023   ALBUMIN 3.4 (L) 06/22/2022   CALCIUM 9.3 06/18/2023   GFRAA 86 05/15/2021    Speciality Comments: Stelara March 06, 2023  Procedures:  No procedures performed Allergies: Remicade [infliximab], Fosamax [alendronate sodium], Singulair [montelukast sodium], Wellbutrin [bupropion], Clindamycin/lincomycin, and Sulfa antibiotics   Assessment / Plan:     Visit Diagnoses: Positive ANA (antinuclear antibody)  Primary osteoarthritis of both hands  S/P hip replacement, right  Primary osteoarthritis of both feet  DDD (degenerative disc disease), cervical  Spondylolisthesis at L4-L5 level  Age-related osteoporosis without current pathological fracture  Vitamin D deficiency  Abnormal SPEP  History of ulcerative colitis  Clostridioides difficile infection  History of gastroesophageal reflux (GERD)  History of hyperlipidemia  Essential hypertension  History of hypothyroidism  History of asthma  Gait instability  Orders: No orders of the defined types were placed in this encounter.  No orders of the defined types were placed in this encounter.   Face-to-face time spent with patient was *** minutes.  Greater than 50% of time was spent in counseling and coordination of care.  Follow-Up Instructions: No follow-ups on file.   [name], PA-C  Note - This record has been created using Dragon software.  Chart creation errors have been sought, but may not always  have been located. Such creation errors do not reflect on  the standard of medical care.

## 2023-09-05 ENCOUNTER — Ambulatory Visit: Payer: Medicare Other | Admitting: Nurse Practitioner

## 2023-09-05 ENCOUNTER — Encounter: Payer: Self-pay | Admitting: Nurse Practitioner

## 2023-09-05 VITALS — BP 134/80 | HR 79 | Temp 97.9°F | Ht 63.5 in | Wt 113.0 lb

## 2023-09-05 DIAGNOSIS — H53143 Visual discomfort, bilateral: Secondary | ICD-10-CM | POA: Diagnosis not present

## 2023-09-05 DIAGNOSIS — Z961 Presence of intraocular lens: Secondary | ICD-10-CM | POA: Diagnosis not present

## 2023-09-05 DIAGNOSIS — H5203 Hypermetropia, bilateral: Secondary | ICD-10-CM | POA: Diagnosis not present

## 2023-09-05 DIAGNOSIS — H04123 Dry eye syndrome of bilateral lacrimal glands: Secondary | ICD-10-CM | POA: Diagnosis not present

## 2023-09-05 DIAGNOSIS — H524 Presbyopia: Secondary | ICD-10-CM | POA: Diagnosis not present

## 2023-09-05 DIAGNOSIS — L03011 Cellulitis of right finger: Secondary | ICD-10-CM | POA: Diagnosis not present

## 2023-09-05 DIAGNOSIS — H52223 Regular astigmatism, bilateral: Secondary | ICD-10-CM | POA: Diagnosis not present

## 2023-09-05 DIAGNOSIS — H26492 Other secondary cataract, left eye: Secondary | ICD-10-CM | POA: Diagnosis not present

## 2023-09-05 DIAGNOSIS — H43393 Other vitreous opacities, bilateral: Secondary | ICD-10-CM | POA: Diagnosis not present

## 2023-09-05 DIAGNOSIS — I10 Essential (primary) hypertension: Secondary | ICD-10-CM | POA: Diagnosis not present

## 2023-09-05 DIAGNOSIS — H35033 Hypertensive retinopathy, bilateral: Secondary | ICD-10-CM | POA: Diagnosis not present

## 2023-09-05 DIAGNOSIS — Z9841 Cataract extraction status, right eye: Secondary | ICD-10-CM | POA: Diagnosis not present

## 2023-09-05 MED ORDER — TRIAMCINOLONE ACETONIDE 0.5 % EX OINT
1.0000 | TOPICAL_OINTMENT | Freq: Two times a day (BID) | CUTANEOUS | 0 refills | Status: DC
Start: 2023-09-05 — End: 2024-08-13

## 2023-09-05 MED ORDER — AZITHROMYCIN 250 MG PO TABS
ORAL_TABLET | ORAL | 0 refills | Status: DC
Start: 2023-09-05 — End: 2023-09-23

## 2023-09-05 NOTE — Progress Notes (Signed)
Assessment and Plan:  Nioma was seen today for acute visit.  Diagnoses and all orders for this visit:  Essential hypertension - continue medications-bisoprolol 5 mg every day , DASH diet, exercise and monitor at home. Call if greater than 130/80.    Cellulitis of finger, right  -     azithromycin (ZITHROMAX) 250 MG tablet; 1 tab daily for 10 days. -     triamcinolone ointment (KENALOG) 0.5 %; Apply 1 Application topically 2 (two) times daily.      Further disposition pending results of labs. Discussed med's effects and SE's.   Over 30 minutes of exam, counseling, chart review, and critical decision making was performed.   Future Appointments  Date Time Provider Department Center  09/12/2023  1:10 PM [name], PA-C [facility] None  09/23/2023 10:30 AM Raynelle Dick, NP [facility] None  10/22/2023  1:30 PM [name], DO [facility] None  12/26/2023 10:30 AM Lucky Cowboy, MD [facility] None  03/30/2024 10:30 AM Raynelle Dick, NP [facility] None  08/06/2024 10:00 AM Lucky Cowboy, MD [facility] None    ------------------------------------------------------------------------------------------------------------------   HPI BP 134/80   Pulse 79   Temp 97.9 F (36.6 C)   Ht 5' 3.5" (1.613 m)   Wt 113 lb (51.3 kg)   SpO2 98%   BMI 19.70 kg/m   82 y.o.female presents for 1st 2 fingers on right hand throbbing with pain , warm and red for approximately 1 week. She has been soaking her fingers in epsom salts and denies any injuries to the fingers.  She has been on long term Vacomycin for colitis.    BP well controlled with bisoprolol 5 mg every day BP Readings from Last 3 Encounters:  09/05/23 134/80  08/27/23 118/70  08/12/23 128/74  Denies headaches, chest pain, shortness of breath and dizziness   BMI is Body mass index is 19.7 kg/m., she has been working on diet and exercise. Wt Readings from Last 3 Encounters:  09/05/23 113 lb (51.3 kg)  08/27/23 114 lb  (51.7 kg)  08/12/23 111 lb 3.2 oz (50.4 kg)    Past Medical History:  Diagnosis Date   Arthritis    Benign labile hypertension    Cancer (HCC)    skin cancer on face   Colitis    DJD (degenerative joint disease)    Endometrial polyp    GERD (gastroesophageal reflux disease)    History of basal cell carcinoma excision    History of esophagitis    HOH (hard of hearing)    right ear better than left  -- wears no aides   Hyperlipidemia    Hypothyroidism    Neuromuscular disorder (HCC)    neuropathy in feet   Osteopenia    Prediabetes    S/P right hip fracture 08/17/2020   Sepsis (HCC) 2023   per patient   Vitamin D deficiency    Wears glasses      Allergies  Allergen Reactions   Remicade [Infliximab]    Fosamax [Alendronate Sodium] Other (See Comments)    GI upset    Singulair [Montelukast Sodium] Other (See Comments)    Makes pt jittery   Wellbutrin [Bupropion] Hives   Clindamycin/Lincomycin Rash   Sulfa Antibiotics Rash    Current Outpatient Medications on File Prior to Visit  Medication Sig   acetaminophen (TYLENOL) 500 MG tablet Take 500 mg by mouth at bedtime as needed for moderate pain or headache.    Ascorbic Acid (VITAMIN C) 1000 MG  tablet Take 1,000 mg by mouth daily.    aspirin EC 81 MG tablet Take 1 tablet (81 mg total) by mouth in the morning and at bedtime. (Patient taking differently: Take 81 mg by mouth daily.)   bisoprolol (ZEBETA) 5 MG tablet Take 1 tablet every Morning for BP   Calcium Carbonate-Vit D-Min (CALCIUM 1200 PO) Take 1,200 mg by mouth daily.   CINNAMON PO Take 2,000 mg by mouth daily.   Coenzyme Q10 (COQ10) 200 MG CAPS Take 200 mg by mouth daily.   COLESTIPOL HCL PO Take by mouth. 2 tabs BID   cycloSPORINE (RESTASIS) 0.05 % ophthalmic emulsion Place 1 drop into both eyes 2 (two) times daily.   Fluticasone Propionate (FLONASE NA) Place into the nose.   levothyroxine (SYNTHROID) 125 MCG tablet TAKE 1 TABLET BY MOUTH DAILY ON AN EMPTY  STOMACH WITH ONLY WATER FOR 30 MINUTES(NO ANTACIDS MEDICATIONS, CALCIUM OR MAGNESIUM FOR 4 HOURS) (Patient taking differently: Take 125 mcg by mouth daily before breakfast.)   loperamide (IMODIUM) 2 MG capsule Take 4 mg by mouth as needed.   loratadine (CLARITIN) 10 MG tablet Take 10 mg by mouth daily as needed for allergies.   meclizine (ANTIVERT) 25 MG tablet 1/2-1 pill up to 3 times daily for motion sickness/dizziness   Mesalamine (ASACOL PO) Take by mouth. Takes 2 tablets, BID   Multiple Vitamin (THERA VITAMIN PO) Take 1 tablet by mouth daily.   Omega-3 Fatty Acids (FISH OIL) 1200 MG CAPS Take 1,200 mg by mouth daily.    pantoprazole (PROTONIX) 40 MG tablet Take 1 tablet  2 x /day  for Heartburn & Indigestion (Patient taking differently: Take 40 mg by mouth 2 (two) times daily.)   Probiotic Product (PROBIOTIC PO) Take 1 capsule by mouth daily.   PROLIA 60 MG/ML SOSY injection Inject 60 mg into the skin every 6 (six) months.   pseudoephedrine (SUDAFED) 120 MG 12 hr tablet Take  1 tablet  2 x /day (every 12 hours)  for Head and Chest Congestion (Patient taking differently: Take 120 mg by mouth daily as needed for congestion (Head and chest congestion).)   simvastatin (ZOCOR) 20 MG tablet Take 1 tablet at Bedtime for Cholesterol   STELARA 45 MG/0.5ML SOLN Inject into the skin.   vancomycin (VANCOCIN) 125 MG capsule Take by mouth.   No current facility-administered medications on file prior to visit.    ROS: all negative except above.   Physical Exam:  BP 134/80   Pulse 79   Temp 97.9 F (36.6 C)   Ht 5' 3.5" (1.613 m)   Wt 113 lb (51.3 kg)   SpO2 98%   BMI 19.70 kg/m   General Appearance: Thin pleasant female, in no apparent distress. Eyes: PERRLA, EOMs, conjunctiva no swelling or erythema  Neck: Supple, thyroid normal.  Respiratory: Respiratory effort normal, BS equal bilaterally without rales, rhonchi, wheezing or stridor.  Cardio: RRR with no MRGs. Brisk peripheral pulses  without edema.  Abdomen: Soft, + BS.  Non tender, no guarding, rebound, hernias, masses. Lymphatics: Non tender without lymphadenopathy.  Musculoskeletal: Full ROM, 5/5 strength, normal gait.  Skin: Warm, dry . Right index and middle finger reddened, warm to touch and have bright right spot on tip of each finger- tender to the touch Neuro: Cranial nerves intact. Normal muscle tone, no cerebellar symptoms. Sensation intact.  Psych: Awake and oriented X 3, normal affect, Insight and Judgment appropriate.     Raynelle Dick, NP 4:00 PM [facility]

## 2023-09-05 NOTE — Patient Instructions (Addendum)
1st 2 fingers on right hand throbbing with pain , warm and red for approximately 1 week Right index and middle finger reddened, warm to touch and have bright right spot on tip of each finger- tender to the touch Start: -     azithromycin (ZITHROMAX) 250 MG tablet; 1 tab daily for 10 days. -     triamcinolone ointment (KENALOG) 0.5 %; Apply 1 Application topically 2 (two) times daily.  Please call GI doctor in [facility] and advise to determine if possible vancomycin reaction

## 2023-09-11 NOTE — Progress Notes (Signed)
Assessment and Plan:  Debbie Houston was seen today for acute visit.  Diagnoses and all orders for this visit:  Essential hypertension - continue medications-bisoprolol 5 mg every day , DASH diet, exercise and monitor at home. Call if greater than 130/80.    Cellulitis of finger, right Improved greatly Complete course of Zithromax and triamcinolone Monitor and if recurs notify the office    Further disposition pending results of labs. Discussed med's effects and SE's.   Over 30 minutes of exam, counseling, chart review, and critical decision making was performed.   Future Appointments  Date Time Provider Department Center  09/23/2023 10:30 AM Raynelle Dick, NP [facility] None  10/01/2023  3:10 PM [name], PA-C [facility] None  10/22/2023  1:30 PM [name], DO [facility] None  12/26/2023 10:30 AM Lucky Cowboy, MD [facility] None  03/30/2024 10:30 AM Raynelle Dick, NP [facility] None  08/06/2024 10:00 AM Lucky Cowboy, MD [facility] None    ------------------------------------------------------------------------------------------------------------------   HPI BP 122/76   Pulse 73   Temp 97.9 F (36.6 C)   Ht 5' 3.5" (1.613 m)   Wt 110 lb 12.8 oz (50.3 kg)   SpO2 98%   BMI 19.32 kg/m   82 y.o.female presents for 1st 2 fingers on right hand throbbing with pain , warm and red and treated with zithromax and triamcinolone daily  Her pain has improved and redness has almost completely resolved.    BP well controlled with bisoprolol 5 mg every day BP Readings from Last 3 Encounters:  09/13/23 122/76  09/05/23 134/80  08/27/23 118/70  Denies headaches, chest pain, shortness of breath and dizziness   BMI is Body mass index is 19.32 kg/m., she has been working on diet and exercise. Wt Readings from Last 3 Encounters:  09/13/23 110 lb 12.8 oz (50.3 kg)  09/05/23 113 lb (51.3 kg)  08/27/23 114 lb (51.7 kg)    Past Medical History:  Diagnosis Date   Arthritis     Benign labile hypertension    Cancer (HCC)    skin cancer on face   Colitis    DJD (degenerative joint disease)    Endometrial polyp    GERD (gastroesophageal reflux disease)    History of basal cell carcinoma excision    History of esophagitis    HOH (hard of hearing)    right ear better than left  -- wears no aides   Hyperlipidemia    Hypothyroidism    Neuromuscular disorder (HCC)    neuropathy in feet   Osteopenia    Prediabetes    S/P right hip fracture 08/17/2020   Sepsis (HCC) 2023   per patient   Vitamin D deficiency    Wears glasses      Allergies  Allergen Reactions   Remicade [Infliximab]    Fosamax [Alendronate Sodium] Other (See Comments)    GI upset    Singulair [Montelukast Sodium] Other (See Comments)    Makes pt jittery   Wellbutrin [Bupropion] Hives   Clindamycin/Lincomycin Rash   Sulfa Antibiotics Rash    Current Outpatient Medications on File Prior to Visit  Medication Sig   acetaminophen (TYLENOL) 500 MG tablet Take 500 mg by mouth at bedtime as needed for moderate pain or headache.    Ascorbic Acid (VITAMIN C) 1000 MG tablet Take 1,000 mg by mouth daily.    aspirin EC 81 MG tablet Take 1 tablet (81 mg total) by mouth in the morning and at bedtime. (Patient taking differently:  Take 81 mg by mouth daily.)   azithromycin (ZITHROMAX) 250 MG tablet 1 tab daily for 10 days.   bisoprolol (ZEBETA) 5 MG tablet Take 1 tablet every Morning for BP   Calcium Carbonate-Vit D-Min (CALCIUM 1200 PO) Take 1,200 mg by mouth daily.   CINNAMON PO Take 2,000 mg by mouth daily.   Coenzyme Q10 (COQ10) 200 MG CAPS Take 200 mg by mouth daily.   COLESTIPOL HCL PO Take by mouth. 2 tabs BID   cycloSPORINE (RESTASIS) 0.05 % ophthalmic emulsion Place 1 drop into both eyes 2 (two) times daily.   Fluticasone Propionate (FLONASE NA) Place into the nose.   levothyroxine (SYNTHROID) 125 MCG tablet TAKE 1 TABLET BY MOUTH DAILY ON AN EMPTY STOMACH WITH ONLY WATER FOR 30 MINUTES(NO  ANTACIDS MEDICATIONS, CALCIUM OR MAGNESIUM FOR 4 HOURS) (Patient taking differently: Take 125 mcg by mouth daily before breakfast.)   loperamide (IMODIUM) 2 MG capsule Take 4 mg by mouth as needed.   loratadine (CLARITIN) 10 MG tablet Take 10 mg by mouth daily as needed for allergies.   meclizine (ANTIVERT) 25 MG tablet 1/2-1 pill up to 3 times daily for motion sickness/dizziness   Mesalamine (ASACOL PO) Take by mouth. Takes 2 tablets, BID   Multiple Vitamin (THERA VITAMIN PO) Take 1 tablet by mouth daily.   Omega-3 Fatty Acids (FISH OIL) 1200 MG CAPS Take 1,200 mg by mouth daily.    pantoprazole (PROTONIX) 40 MG tablet Take 1 tablet  2 x /day  for Heartburn & Indigestion (Patient taking differently: Take 40 mg by mouth 2 (two) times daily.)   Probiotic Product (PROBIOTIC PO) Take 1 capsule by mouth daily.   PROLIA 60 MG/ML SOSY injection Inject 60 mg into the skin every 6 (six) months.   pseudoephedrine (SUDAFED) 120 MG 12 hr tablet Take  1 tablet  2 x /day (every 12 hours)  for Head and Chest Congestion (Patient taking differently: Take 120 mg by mouth daily as needed for congestion (Head and chest congestion).)   simvastatin (ZOCOR) 20 MG tablet Take 1 tablet at Bedtime for Cholesterol   STELARA 45 MG/0.5ML SOLN Inject into the skin.   triamcinolone ointment (KENALOG) 0.5 % Apply 1 Application topically 2 (two) times daily.   vancomycin (VANCOCIN) 125 MG capsule Take by mouth.   No current facility-administered medications on file prior to visit.    ROS: all negative except above.   Physical Exam:  BP 122/76   Pulse 73   Temp 97.9 F (36.6 C)   Ht 5' 3.5" (1.613 m)   Wt 110 lb 12.8 oz (50.3 kg)   SpO2 98%   BMI 19.32 kg/m   General Appearance: Thin pleasant female, in no apparent distress. Eyes: PERRLA, EOMs, conjunctiva no swelling or erythema  Neck: Supple, thyroid normal.  Respiratory: Respiratory effort normal, BS equal bilaterally without rales, rhonchi, wheezing or  stridor.  Cardio: RRR with no MRGs. Brisk peripheral pulses without edema.  Abdomen: Soft, + BS.  Non tender, no guarding, rebound, hernias, masses. Lymphatics: Non tender without lymphadenopathy.  Musculoskeletal: Full ROM, 5/5 strength, normal gait.  Skin: Warm, dry . Right index and middle finger no longer red, tenderness mild Neuro: Cranial nerves intact. Normal muscle tone, no cerebellar symptoms. Sensation intact.  Psych: Awake and oriented X 3, normal affect, Insight and Judgment appropriate.     Raynelle Dick, NP 11:22 AM [facility]

## 2023-09-12 ENCOUNTER — Ambulatory Visit: Payer: Medicare Other | Admitting: Physician Assistant

## 2023-09-12 DIAGNOSIS — Z8719 Personal history of other diseases of the digestive system: Secondary | ICD-10-CM

## 2023-09-12 DIAGNOSIS — M4316 Spondylolisthesis, lumbar region: Secondary | ICD-10-CM

## 2023-09-12 DIAGNOSIS — A498 Other bacterial infections of unspecified site: Secondary | ICD-10-CM

## 2023-09-12 DIAGNOSIS — M810 Age-related osteoporosis without current pathological fracture: Secondary | ICD-10-CM

## 2023-09-12 DIAGNOSIS — R778 Other specified abnormalities of plasma proteins: Secondary | ICD-10-CM

## 2023-09-12 DIAGNOSIS — E559 Vitamin D deficiency, unspecified: Secondary | ICD-10-CM

## 2023-09-12 DIAGNOSIS — Z8709 Personal history of other diseases of the respiratory system: Secondary | ICD-10-CM

## 2023-09-12 DIAGNOSIS — M19071 Primary osteoarthritis, right ankle and foot: Secondary | ICD-10-CM

## 2023-09-12 DIAGNOSIS — R768 Other specified abnormal immunological findings in serum: Secondary | ICD-10-CM

## 2023-09-12 DIAGNOSIS — M5030 Other cervical disc degeneration, unspecified cervical region: Secondary | ICD-10-CM

## 2023-09-12 DIAGNOSIS — R2681 Unsteadiness on feet: Secondary | ICD-10-CM

## 2023-09-12 DIAGNOSIS — Z8639 Personal history of other endocrine, nutritional and metabolic disease: Secondary | ICD-10-CM

## 2023-09-12 DIAGNOSIS — M19041 Primary osteoarthritis, right hand: Secondary | ICD-10-CM

## 2023-09-12 DIAGNOSIS — I10 Essential (primary) hypertension: Secondary | ICD-10-CM

## 2023-09-12 DIAGNOSIS — Z96641 Presence of right artificial hip joint: Secondary | ICD-10-CM

## 2023-09-12 NOTE — Unmapped (Signed)
[facility] Specialty Medication Onboarding    Specialty Medication: VELSIPITY 2 mg Tab (etrasimod)  Prior Authorization: Approved   Financial Assistance: No - copay  <$25  Final Copay/Day Supply: $0 / 30 days    Insurance Restrictions: None     Notes to Pharmacist:   Credit Card on File: not applicable    The triage team has completed the benefits investigation and has determined that the patient is able to fill this medication at [facility]. Please contact the patient to complete the onboarding or follow up with the prescribing physician as needed.

## 2023-09-13 ENCOUNTER — Encounter: Payer: Self-pay | Admitting: Nurse Practitioner

## 2023-09-13 ENCOUNTER — Ambulatory Visit (INDEPENDENT_AMBULATORY_CARE_PROVIDER_SITE_OTHER): Payer: Medicare Other | Admitting: Nurse Practitioner

## 2023-09-13 VITALS — BP 122/76 | HR 73 | Temp 97.9°F | Ht 63.5 in | Wt 110.8 lb

## 2023-09-13 DIAGNOSIS — L03011 Cellulitis of right finger: Secondary | ICD-10-CM | POA: Diagnosis not present

## 2023-09-13 DIAGNOSIS — I10 Essential (primary) hypertension: Secondary | ICD-10-CM

## 2023-09-13 NOTE — Patient Instructions (Signed)
GENERAL HEALTH GOALS   Know what a healthy weight is for you (roughly BMI <25) and aim to maintain this   Aim for 7+ servings of fruits and vegetables daily   70-80+ fluid ounces of water or unsweet tea for healthy kidneys   Limit to max 1 drink of alcohol per day; avoid smoking/tobacco   Limit animal fats in diet for cholesterol and heart health - choose grass fed whenever available   Avoid highly processed foods, and foods high in saturated/trans fats   Aim for low stress - take time to unwind and care for your mental health   Aim for 150 min of moderate intensity exercise weekly for heart health, and weights twice weekly for bone health   Aim for 7-9 hours of sleep daily  

## 2023-09-13 NOTE — Unmapped (Signed)
[facility]    Patient Onboarding/Medication Counseling    Ms.Traci Bennett is a 82 y.o. female with UC who I am counseling today on initiation of therapy.  I am speaking to the patient.    Was a Nurse, learning disability used for this call? No    Verified patient's date of birth / HIPAA.    Specialty medication(s) to be sent: Inflammatory Disorders: Velsipity      Non-specialty medications/supplies to be sent: n/a      Medications not needed at this time: n/a         Velsipity (etrasimod)    Medication & Administration     Dosage: Ulcerative colitis: Take 1 tablet (2 mg) by mouth once daily     Administration: Take by mouth once daily, with or without food. Swallow whole. Do not chew, break, or crush.     Adherence/Missed dose instructions: take missed dose as soon as you remember. If it is close to the time of your next dose, skip the dose and resume with your next scheduled dose.    Lab tests required prior to treatment initiation:  Hepatitis B: Negative: 07/10/2021  Tuberculosis: Completed:  03/29/2022  CBC: Completed: 07/30/2023  LFTs: Completed: 07/30/2023  Ophthalmic exam: 09/05/2023  EKG: Completed - 09/05/2023  For female patients: Not of childbearing age    Goals of Therapy     Ulcerative colitis:   Remission, endoscopic improvement, and endoscopic-histologic improvement  Reduction in rectal bleeding    Side Effects & Monitoring Parameters     Dizziness or headache    The following side effects should be reported to the provider:  Signs of allergic reaction, like rash; hives; itching; red, swollen, blistered or peeling skin; wheezing; trouble breathing     Contraindications, Warnings, & Precautions     Have had a heart attack, chest pain (unstable angina), stroke or mini stroke (transient ischemic attack or TIA), and certain types of heart failure requiring hospitalization in the last 6 months.  Have or have had a history of unusual heartbeats (arrhythmia) that is not corrected by a pacemaker.    Drug/Food Interactions     Medication list reviewed in Epic. The patient was instructed to inform the care team before taking any new medications or supplements.   risk rating of C: Etrasimod may enhance the bradycardic effect of Bradycardia-Causing Agents.  Monitor for decreased heart rate - okay to start and will monitor HR .        Storage, Handling Precautions, & Disposal   Store this medication at room temperature.  Store in a dry place  Keep out of reach of children and pets      Current Medications (including OTC/herbals), Comorbidities and Allergies     Current Outpatient Medications   Medication Sig Dispense Refill    acetaminophen (TYLENOL) 500 MG tablet Take 1 tablet (500 mg total) by mouth.      ascorbic acid, vitamin C, (VITAMIN C) 1000 MG tablet Take 1 tablet (1,000 mg total) by mouth.      aspirin (ECOTRIN) 81 MG tablet Take 1 tablet (81 mg total) by mouth daily.      atenolol (TENORMIN) 25 MG tablet 1/2 tablet Orally Once a day      bisoprolol (ZEBETA) 10 MG tablet Take 1 tablet (10 mg total) by mouth daily.      bisoproloL-hydrochlorothiazide (ZIAC) 2.5-6.25 mg per tablet Take 1 tablet by mouth in the morning.      calcium carbonate 1,500  mg (600 mg elem calcium) tablet 1 tab Orally once a day      calcium carbonate-vit D3-min 600 mg calcium- 400 unit Tab Take by mouth.      cetirizine (ZYRTEC) 10 MG tablet Take 1 tablet (10 mg total) by mouth.      cholecalciferol, vitamin D3-50 mcg, 2,000 unit,, 50 mcg (2,000 unit) tablet Take 1 tablet (50 mcg total) by mouth daily.      cinnamon bark 500 mg capsule Take 500 mg by mouth.      coenzyme Q10 200 mg capsule Take 1 capsule (200 mg total) by mouth daily.      colestipol (COLESTID) 1 gram tablet Take 4 tablets (4 g total) by mouth Three (3) times a day. 360 tablet 1    cycloSPORINE (RESTASIS) 0.05 % ophthalmic emulsion Administer 1 drop to both eyes every twelve (12) hours.      denosumab (PROLIA SUBQ) Inject under the skin every six (6) months.      etrasimod (VELSIPITY) 2 mg Tab Take 1 tablet (2 mg) by mouth in the morning. 30 tablet 2    famotidine (PEPCID) 40 MG tablet Take 10 mg by mouth every evening.      flaxseed oil 1,000 mg cap 1 tab Orally Once a day      fluticasone propionate (FLONASE) 50 mcg/actuation nasal spray 2 sprays into each nostril.      Lactobacillus acidophilus 10 billion cell cap Take 1 capsule by mouth daily.      levothyroxine (SYNTHROID) 125 MCG tablet Take 1 tablet (125 mcg total) by mouth daily before breakfast.      loperamide (IMODIUM A-D) 2 mg tablet Take 1 tablet (2 mg total) by mouth four (4) times a day as needed for diarrhea. 120 tablet 3    loratadine (CLARITIN) 10 mg tablet Take 1 tablet (10 mg total) by mouth daily.      mesalamine (LIALDA) 1.2 gram DR tablet Take 2 tablets (2.4 g total) by mouth two (2) times a day. 120 tablet 5    multivitamin therapeutic with minerals (THERA-M) 27-0.4 mg Tab Take 1 tablet by mouth daily.      multivitamin with minerals (DAILY MULTIVITAMIN-MINERALS) tablet Take by mouth.      omega-3 acid ethyl esters (LOVAZA) 1 gram capsule Take by mouth.      omega-3 fatty acids-fish oil 360-1,200 mg cap Take 1,200 mg by mouth.      pantoprazole (PROTONIX) 40 MG tablet TAKE 1 TABLET BY MOUTH TWICE DAILY BEFORE BREAKFAST AND BEFORE DINNER 180 tablet 3    simvastatin (ZOCOR) 20 MG tablet Take 1 tablet (20 mg total) by mouth daily before breakfast.      vancomycin (VANCOCIN) 125 MG capsule Take 1 capsule (125 mg total) by mouth two (2) times a day. 60 capsule 0     No current facility-administered medications for this visit.       Allergies   Allergen Reactions    Bupropion Hives and Other (See Comments)    Montelukast Other (See Comments)     Reaction:  Makes pt jittery    Reaction:  Makes pt jittery      Reaction:  Makes pt jittery Makes pt jittery      Makes pt jittery    Montelukast Sodium Other (See Comments)     Reaction:  Makes pt jittery  Makes pt jittery      Alendronate Sodium Other (See Comments) and Rash     Reaction:  GI upset   GI upset       Clindamycin Rash    Other Rash    Sulfa (Sulfonamide Antibiotics) Rash       Patient Active Problem List   Diagnosis    Pancolitis (CMS-HCC)       Reviewed and up to date in Epic.    Appropriateness of Therapy     Acute infections noted within Epic:  No active infections  Patient reported infection: None    Is the medication and dose appropriate based on diagnosis, medication list, comorbidities, allergies, medical history, patient???s ability to self-administer the medication, and therapeutic goals? Yes    Prescription has been clinically reviewed: Yes      Baseline Quality of Life Assessment      How many days over the past month did your UC  keep you from your normal activities? For example, brushing your teeth or getting up in the morning. Patient declined to answer    Financial Information     Medication Assistance provided: Prior Authorization    Anticipated copay of $0 reviewed with patient. Verified delivery address.    Delivery Information     Scheduled delivery date: 10/30    Expected start date: 10/30      Medication will be delivered via UPS to the prescription address in [facility].  This shipment will not require a signature.      Explained the services we provide at [facility] and that each month we would call to set up refills.  Stressed importance of returning phone calls so that we could ensure they receive their medications in time each month.  Informed patient that we should be setting up refills 7-10 days prior to when they will run out of medication.  A pharmacist will reach out to perform a clinical assessment periodically.  Informed patient that a welcome packet, containing information about our pharmacy and other support services, a Notice of Privacy Practices, and a drug information handout will be sent.      The patient or caregiver noted above participated in the development of this care plan and knows that they can request review of or adjustments to the care plan at any time.      Patient or caregiver verbalized understanding of the above information as well as how to contact the pharmacy at [phone] option 4 with any questions/concerns.  The pharmacy is open Monday through Friday 8:30am-4:30pm.  A pharmacist is available 24/7 via pager to answer any clinical questions they may have.    Patient Specific Needs     Does the patient have any physical, cognitive, or cultural barriers? No    Does the patient have adequate living arrangements? (i.e. the ability to store and take their medication appropriately) Yes    Did you identify any home environmental safety or security hazards? No    Patient prefers to have medications discussed with  Patient     Is the patient or caregiver able to read and understand education materials at a high school level or above? Yes    Patient's primary language is  English     Is the patient high risk? No    SOCIAL DETERMINANTS OF HEALTH     At the [facility], we have learned that life circumstances - like trouble affording food, housing, utilities, or transportation can affect the health of many of our patients.   That is why we wanted to ask: are you  currently experiencing any life circumstances that are negatively impacting your health and/or quality of life? No    Social Determinants of Health     Food Insecurity: Low Risk  (07/11/2023)    Received from [facility]    Hunger Vital Sign     Worried About Running Out of Food in the Last Year: Never true     Ran Out of Food in the Last Year: Never true   Internet Connectivity: Not on file   Housing/Utilities: Not on file   Tobacco Use: Medium Risk (08/12/2023)    Received from [facility]    Patient History     Smoking Tobacco Use: Former     Smokeless Tobacco Use: Never     Passive Exposure: Never   Transportation Needs: No Transportation Needs (12/13/2021)    Received from [facility], [facility]    PRAPARE - Transportation     Lack of Transportation (Medical): No     Lack of Transportation (Non-Medical): No   Alcohol Use: Not on file   Interpersonal Safety: Unknown (09/13/2023)    Interpersonal Safety     Unsafe Where You Currently Live: Not on file     Physically Hurt by Anyone: Not on file     Abused by Anyone: Not on file   Physical Activity: Not on file   Intimate Partner Violence: Not At Risk (04/27/2021)    Received from [facility], [facility]    Humiliation, Afraid, Rape, and Kick questionnaire     Fear of Current or Ex-Partner: No     Emotionally Abused: No     Physically Abused: No     Sexually Abused: No   Stress: Not on file   Substance Use: Not on file   Social Connections: Not on file   Financial Resource Strain: Not on file   Depression: Not at risk (07/11/2023)    Received from [facility]    PHQ-2     Patient Health Questionnaire-2 Score: 0   Health Literacy: Not on file       Would you be willing to receive help with any of the needs that you have identified today? Not applicable       Teofilo Pod, PharmD  [facility] Specialty Pharmacist

## 2023-09-16 ENCOUNTER — Other Ambulatory Visit: Payer: Self-pay

## 2023-09-16 DIAGNOSIS — K529 Noninfective gastroenteritis and colitis, unspecified: Secondary | ICD-10-CM

## 2023-09-16 MED ORDER — VELSIPITY 2 MG PO TABS: 2.0000 mg | ORAL_TABLET | Freq: Every day | ORAL | Status: AC

## 2023-09-16 NOTE — Progress Notes (Signed)
Patient's GI office called to update new medication being prescribed, Velsipity. Med list updated.

## 2023-09-16 NOTE — Unmapped (Signed)
Notified by [facility] about multiple beta blockers listed on patients medication list and drug-drug interaction with new start, etrasimod. Called patient's PCP office, [facility] at [phone] to inquire about current BP medications. Office RN reports patient only on bisoprolol 5 mg daily. Notified RN about new start of etrasimod and that there is a drug-drug interaction between beta blockers and etrasimod in that they can both cause bradycardia. Based on etrasimod safety data, patient's who were started on etrasimod and on stable doses of beta blockers experienced little to no change in heart rate. Patient is okay to go ahead and start etrasimod with help from PCP to monitor HR. Medication list updated to reflect current BP medication.    Edsel Petrin, PharmD, BCPS, CPP  Clinical Pharmacist Practitioner, GI/IBD  [phone]

## 2023-09-17 NOTE — Progress Notes (Unsigned)
Office Visit Note  Patient: Debbie Houston             Date of Birth: [birth year]           MRN: [id number]             PCP: Lucky Cowboy, MD Referring: Lucky Cowboy, MD Visit Date: 10/01/2023 Occupation: @GUAROCC @  Subjective:  Recent hospitalization   History of Present Illness: Debbie Houston is a 82 y.o. female with history of positive ANA, DDD, and osteoarthritis.  Patient was recently hospitalized on 09/25/2023-09/28/2023 for management of proctitis and left buttock cellulitis.  She will underwent a flex sigmoidoscopy she was treated with IV vancomycin and Zosyn and was discharged home with linezolid and Augmentin.  Patient has a history of microscopic colitis and follows with [facility] gastroenterology.  Her current treatment regimen includes Velsipity, mesalamine, and colestipol but are currently on hold.  She was evaluated by her gastroenterologist at [facility] yesterday.  She will be scheduled for a MRI of the pelvis for further evaluation.   Patient continues to experience intermittent arthralgias and joint stiffness especially in her hands and knee joints.  She states she also has symptoms of neuropathy in her hands and feet which contribute to her symptoms.  She denies any joint swelling at this time.  She denies any Achilles tendinitis or plantar fasciitis.  She is not having any SI joint pain at this time.  She has intermittent discomfort in her C-spine.  She denies any signs or symptoms of uveitis.   Activities of Daily Living:  Patient reports morning stiffness for 5 minutes.   Patient Denies nocturnal pain.  Difficulty dressing/grooming: Denies Difficulty climbing stairs: Reports Difficulty getting out of chair: Reports Difficulty using hands for taps, buttons, cutlery, and/or writing: Reports  Review of Systems  Constitutional:  Positive for fatigue.  HENT:  Positive for mouth sores and mouth dryness.   Eyes:  Positive for dryness.  Respiratory:  Negative for shortness of  breath.   Cardiovascular:  Negative for chest pain and palpitations.  Gastrointestinal:  Positive for diarrhea. Negative for blood in stool and constipation.  Endocrine: Negative for increased urination.  Genitourinary:  Positive for involuntary urination.  Musculoskeletal:  Positive for joint pain, gait problem, joint pain, myalgias, muscle weakness, morning stiffness, muscle tenderness and myalgias. Negative for joint swelling.  Skin:  Negative for color change, rash, hair loss and sensitivity to sunlight.  Allergic/Immunologic: Positive for susceptible to infections.  Neurological:  Positive for dizziness. Negative for headaches.  Hematological:  Negative for swollen glands.  Psychiatric/Behavioral:  Negative for depressed mood and sleep disturbance. The patient is not nervous/anxious.     PMFS History:  Patient Active Problem List   Diagnosis Date Noted   Hyponatremia 09/26/2023   Cellulitis of left buttock 09/26/2023   Proctitis 09/25/2023   History of microscopic colitis 09/25/2023   GERD (gastroesophageal reflux disease) 09/25/2023   Sepsis secondary to UTI (HCC) 06/24/2022   Extensor tenosynovitis of right wrist 06/23/2022   Thrombocytopenia (HCC) 06/23/2022   Prediabetes 06/23/2022   Intertrochanteric fracture of right femur, closed, with nonunion, subsequent encounter 01/22/2022   History of ulcerative colitis 07/10/2021   Complete right bundle branch block (RBBB) 05/15/2021   Body mass index (BMI) of 19.0-19.9 in adult 05/12/2021   Bilateral nephrolithiasis 09/21/2020   Senile purpura (HCC) 08/17/2020   Bilateral temporomandibular joint pain 06/15/2020   Spondylolisthesis at L4-L5 level 11/25/2018   Essential hypertension    Hyperlipidemia  Hypothyroidism    GERD    Asthma    DJD (degenerative joint disease)    Vitamin D deficiency    Osteoporosis with current pathological fracture    Allergy     Past Medical History:  Diagnosis Date   Arthritis    Benign  labile hypertension    Cancer (HCC)    skin cancer on face   Cellulitis    Colitis    DJD (degenerative joint disease)    Endometrial polyp    GERD (gastroesophageal reflux disease)    History of basal cell carcinoma excision    History of esophagitis    HOH (hard of hearing)    right ear better than left  -- wears no aides   Hyperlipidemia    Hypothyroidism    Neuromuscular disorder (HCC)    neuropathy in feet   Osteopenia    Prediabetes    S/P right hip fracture 08/17/2020   Sepsis (HCC) 2023   per patient   Vitamin D deficiency    Wears glasses     Family History  Problem Relation Age of Onset   Hypertension Mother    Cancer Mother        COLON   Hyperlipidemia Mother    Cancer Father        BREAST WITH BRAIN METS   Hyperlipidemia Father    Cancer Sister        COLON (TEENS), BREAST (50)   Hyperlipidemia Sister    Diabetes Brother    Hyperlipidemia Brother    Hypertension Brother    Colon cancer Brother 41   Cancer Maternal Grandmother        RENAL   Hyperlipidemia Maternal Grandmother    Diabetes Maternal Grandfather    Stroke Maternal Grandfather    Hyperlipidemia Maternal Grandfather    Hyperlipidemia Paternal Grandmother    Hyperlipidemia Paternal Grandfather    Autism Son    Colitis Son    Autism spectrum disorder Son    Healthy Son    Past Surgical History:  Procedure Laterality Date   BACK SURGERY  2020   BIOPSY  09/27/2023   Procedure: BIOPSY;  Surgeon: Vida Rigger, MD;  Location: [facility] ENDOSCOPY;  Service: Gastroenterology;;   CATARACT EXTRACTION, BILATERAL Bilateral 2020   CONVERSION TO TOTAL HIP Right 01/22/2022   Procedure: CONVERSION TO TOTAL HIP;  Surgeon: Joen Laura, MD;  Location: [facility] ORS;  Service: Orthopedics;  Laterality: Right;   DILATATION & CURETTAGE/HYSTEROSCOPY WITH MYOSURE N/A 10/27/2014   Procedure: DILATATION & CURETTAGE/HYSTEROSCOPY WITH MYOSURE;  Surgeon: Juluis Mire, MD;  Location: [facility];  Service:  Gynecology;  Laterality: N/A;   DILATION AND CURETTAGE OF UTERUS     FLEXIBLE SIGMOIDOSCOPY N/A 09/27/2023   Procedure: FLEXIBLE SIGMOIDOSCOPY;  Surgeon: Vida Rigger, MD;  Location: [facility] ENDOSCOPY;  Service: Gastroenterology;  Laterality: N/A;   HIP SURGERY Right 04/2020   rod and screw in right hip    MOHS SURGERY  2005   LEFT NASAL BRIDGE FOR BASAL CELL   TONSILLECTOMY  age 38   Social History   Social History Narrative   Are you right handed or left handed? Left Handed    Are you currently employed ? No   What is your current occupation? No. Use to teacher [employer]    Do you live at home alone? No   Who lives with you? Lives with husband    What type of home do you live in: 1 story or  2 story? One story home              Immunization History  Administered Date(s) Administered   Influenza, High Dose Seasonal PF 07/23/2014, 08/23/2015, 07/10/2016, 08/27/2017, 08/22/2018, 08/25/2019, 08/30/2020, 08/21/2021, 08/21/2022, 08/05/2023   Moderna SARS-COV2 Booster Vaccination 08/21/2022   PFIZER(Purple Top)SARS-COV-2 Vaccination 12/10/2019, 12/31/2019, 08/16/2020, 04/14/2021   Pneumococcal Conjugate-13 05/05/2015   Pneumococcal-Unspecified 08/22/2011   Tdap 08/22/2011   Unspecified SARS-COV-2 Vaccination 08/05/2023   Zoster Recombinant(Shingrix) 09/13/2017, 01/01/2018   Zoster, Live 06/20/2006     Objective: Vital Signs: BP 120/72 (BP Location: Left Arm, Patient Position: Sitting, Cuff Size: Normal)   Pulse 76   Resp 16   Ht 5\' 4"  (1.626 m)   Wt 110 lb (49.9 kg)   BMI 18.88 kg/m    Physical Exam Vitals and nursing note reviewed.  Constitutional:      Appearance: She is well-developed.  HENT:     Head: Normocephalic and atraumatic.  Eyes:     Conjunctiva/sclera: Conjunctivae normal.  Cardiovascular:     Rate and Rhythm: Normal rate and regular rhythm.     Heart sounds: Normal heart sounds.  Pulmonary:     Effort: Pulmonary effort is normal.     Breath sounds: Normal  breath sounds.  Abdominal:     General: Bowel sounds are normal.     Palpations: Abdomen is soft.  Musculoskeletal:     Cervical back: Normal range of motion.  Lymphadenopathy:     Cervical: No cervical adenopathy.  Skin:    General: Skin is warm and dry.     Capillary Refill: Capillary refill takes less than 2 seconds.  Neurological:     Mental Status: She is alert and oriented to person, place, and time.  Psychiatric:        Behavior: Behavior normal.      Musculoskeletal Exam: C-spine has full range of motion with some discomfort.  Limited mobility of the lumbar spine.  No SI joint tenderness upon palpation.  Shoulder joints, elbow joints, and wrist joints have good range of motion with no synovitis.  CMC, PIP, DIP thickening.  Subluxation of several DIP joints.  No synovitis noted.  Right hip replacement has good range of motion.  Left hip joint has good range of motion no groin pain.  Knee joints have good range of motion no warmth or effusion.  Ankle joints have good range of motion with no tenderness or joint swelling.  Thickening of bilateral first MTP joints along with PIP and DIP joints.  No evidence of Achilles tendinitis or plantar fasciitis.  No synovitis or dactylitis noted.  CDAI Exam: CDAI Score: -- Patient Global: --; Provider Global: -- Swollen: --; Tender: -- Joint Exam 10/01/2023   No joint exam has been documented for this visit   There is currently no information documented on the homunculus. Go to the Rheumatology activity and complete the homunculus joint exam.  Investigation: No additional findings.  Imaging: US PELVIS LIMITED (TRANSABDOMINAL ONLY)  Result Date: 09/26/2023 CLINICAL DATA:  Cellulitis and abscess of the left lower extremity. EXAM: US PELVIS LIMITED TECHNIQUE: Ultrasound examination of the pelvic soft tissues was performed in the area of clinical concern. COMPARISON:  None Available. FINDINGS: Targeted sonographic images of the soft tissues  of the left buttock in the region of the clinical concern performed. There is subcutaneous edema. There is a 1.5 x 0.5 x 2.4 cm area of more localized complex fluid in the subcutaneous soft tissues with surrounding hyperemia. Findings  may represent phlegmonous changes or developing abscess. No discrete organized wall at this time. IMPRESSION: Probable cellulitis with a small focal accumulated subcutaneous fluid which may represent phlegmon. No drainable fluid collection at this time. The Electronically Signed   By: Elgie Collard M.D.   On: 09/26/2023 18:57   CT PELVIS W CONTRAST  Result Date: 09/25/2023 CLINICAL DATA:  Perianal abscess or fistula suspected. Pain in left buttock for 1 week. EXAM: CT PELVIS WITH CONTRAST TECHNIQUE: Multidetector CT imaging of the pelvis was performed using the standard protocol following the bolus administration of intravenous contrast. RADIATION DOSE REDUCTION: This exam was performed according to the departmental dose-optimization program which includes automated exposure control, adjustment of the mA and/or kV according to patient size and/or use of iterative reconstruction technique. CONTRAST:  80mL OMNIPAQUE IOHEXOL 300 MG/ML  SOLN COMPARISON:  06/23/2022. FINDINGS: Urinary Tract: Nonobstructive renal calculi are present bilaterally. There is no hydronephrosis. The bladder is unremarkable. Bowel: No bowel obstruction, free air, or pneumatosis. Rectal wall thickening is noted. There is perianal fat stranding on the left extending into the gluteal region and perineum on the left. No abscess or fistula is seen. There is pelvic floor laxity with inferior displacement of the rectum below the pubococcygeal line Vascular/Lymphatic: Aortic atherosclerosis. No pelvic lymphadenopathy is seen. Reproductive: Coarse calcifications are noted in the uterus, compatible with degenerating fibroids. No adnexal mass is seen. Other:  No ascites. Musculoskeletal: Total hip arthroplasty changes  are present on the right. Lumbar spinal fusion hardware is present at L4-L5. There is a compression deformity with augmentation changes at L3. IMPRESSION: 1. Rectal wall thickening with perianal fat stranding extending into the gluteal region and perineum on the left. No abscess or fistula is seen. 2. Findings compatible with pelvic floor dysfunction. 3. Bilateral nephrolithiasis. 4. Uterine fibroids. 5. Aortic atherosclerosis. Electronically Signed   By: Thornell Sartorius M.D.   On: 09/25/2023 20:23    Recent Labs: Lab Results  Component Value Date   WBC 5.7 09/28/2023   HGB 10.8 (L) 09/28/2023   PLT 208 09/28/2023   NA 135 09/28/2023   K 4.0 09/28/2023   CL 103 09/28/2023   CO2 23 09/28/2023   GLUCOSE 96 09/28/2023   BUN 9 09/28/2023   CREATININE 0.76 09/28/2023   BILITOT 0.6 09/26/2023   ALKPHOS 47 09/26/2023   AST 17 09/26/2023   ALT 15 09/26/2023   PROT 5.9 (L) 09/26/2023   ALBUMIN 3.1 (L) 09/26/2023   CALCIUM 8.7 (L) 09/28/2023   GFRAA 86 05/15/2021    Speciality Comments: Stelara March 06, 2023  Procedures:  No procedures performed Allergies: Remicade [infliximab], Fosamax [alendronate sodium], Montelukast, Singulair [montelukast sodium], Wellbutrin [bupropion], Clindamycin/lincomycin, and Sulfa antibiotics   Assessment / Plan:     Visit Diagnoses: Positive ANA (antinuclear antibody): 10/22/19: ANA 1:80 NS, 01/06/20: ENA negative.  She has no clinical features of systemic lupus.  She has not developed any new or worsening symptoms since her last office visit.  She would like to hold off on obtaining updated ANA and ENA panel today. She will notify us if she develops any new or worsening symptoms.  Warning signs and symptoms to monitor for were discussed.  She will follow up in 6 months or sooner if needed.   Primary osteoarthritis of both hands: CMC, PIP, DIP thickening consistent with osteoarthritis of both hands.  No synovitis or dactylitis noted.  Subluxation of several DIP  joints noted.  Discussed the importance of joint protection and muscle strengthening.  She was encouraged to perform hand exercises.  She has tried physical therapy in the past.  She will notify us if her symptoms persist or worsen.  S/P hip replacement, right: Dr. Blanchie Dessert. Doing well.  Good range of motion with no discomfort.  No groin pain currently.  Primary osteoarthritis of both feet: She has good range of motion of both ankle joints on examination today.  No tenderness or synovitis noted.  No evidence of Achilles tendinitis or plantar fasciitis.  Thickening of bilateral first MTP joints along with PIP and DIP joints.  No synovitis or dactylitis noted.  DDD (degenerative disc disease), cervical: C-spine has good range of motion with some discomfort with rotation.  No symptoms of radiculopathy.  Spondylolisthesis at L4-L5 level: Followed by Dr. Danielle Dess.  Limited mobility.  Age-related osteoporosis without current pathological fracture: DEXA scan from December 26, 2020 T score was -2.5 BMD 0.569 in the lumbar spine. She has been on Prolia injections and Evenity by Dr. Cleophas Dunker.   Vitamin D deficiency: She is taking a calcium and vitamin D supplement daily.   Abnormal SPEP  Other microscopic colitis: Under care of [facility] gastroenterology.  Evaluated by GI yesterday and 09/30/2023 after recent hospitalization from 09/25/2023 through 09/28/2023.  Patient was recently hospitalized for management of proctitis and cellulitis of the left buttocks.  She was treated with IV vancomycin and Zosyn inpatient and discharged on oral antibitotics. She is holding velsipity until the cellulitis resolves.  She has continued asacol and colestipol as prescribed.   Patient is awaiting approval for an MRI of the pelvis for further evaluation. She has no signs of inflammatory arthritis at this time.  No synovitis or dactylitis noted on examination today.  No evidence of Achilles tendinitis or plantar fasciitis.  No SI  joint tenderness upon palpation.  No signs or symptoms of uveitis.  She will notify us if she develops any new or worsening symptoms.  Other medical conditions are listed as follows:  Clostridioides difficile infection  History of gastroesophageal reflux (GERD)  History of hyperlipidemia  Essential hypertension: Blood pressure was 120/72 today in the office.  History of hypothyroidism  History of asthma  Gait instability  Orders: No orders of the defined types were placed in this encounter.  No orders of the defined types were placed in this encounter.    Follow-Up Instructions: Return in about 6 months (around 03/30/2024) for +ANA, Osteoarthritis, DDD.   [name], PA-C  Note - This record has been created using Dragon software.  Chart creation errors have been sought, but may not always  have been located. Such creation errors do not reflect on  the standard of medical care.

## 2023-09-19 NOTE — Progress Notes (Signed)
MEDICARE ANNUAL WELLNESS VISIT AND FOLLOW UP  Assessment:   Debbie Houston was seen today for follow-up and medicare wellness.  Diagnoses and all orders for this visit:  Encounter for Medicare annual wellness exam Yearly Mammogram UTD 12/28/22- negative DEXA last was 12/2020 T score - 2.5 Colonoscopy 02/15/23   Essential hypertension - continue medications- bisoprolol 5 mg every day , DASH diet, exercise and monitor at home. Call if greater than 130/80.  - CBC - CMP  Uncomplicated asthma, unspecified asthma severity, unspecified whether persistent Doing well on current regimen, continue with benefit Continue to monitor  Colitis/ Ulcerative pancolitis(HCC) Having worsening episodes of diarrhea Continue medications and follow with Dr. Jacqulyn Bath at [facility]  Senile Purpura(HCC) Has followed with Dr Corliss Skains  GERD Continue PPI/H2 blocker, diet discussed  Hypothyroidism, unspecified type Last TSH elevated Will change dosage to daily -TSH  Osteoarthritis, unspecified osteoarthritis type, unspecified site Doing well, tylenol PRN, Dr. Madelon Lips follows  Osteoporosis with delayed healing of right intertrochanteric fracture of femur Continue Calcium & Vit D Dr. Cleophas Dunker following for prolia, she is to be starting a new injection for bone growth but had to wait until impetigo infection resolved. Dexa 2/22 shows stable osteoporosis  Will continue to monitor   Complete Right Bundle Branch Block Monitor Follows with cardiology but her doctor has retired  Spondylolisthesis at L4-L5 level S/P lumbar fusion.  Doing well Following with Dr Danielle Dess  Mixed hyperlipidemia Continue current regimen Discussed dietary and exercise modifications  - CMP, Lipid panel  Vitamin D deficiency Continue Vit D supplementation  Vertigo Monitor; meclizine PRN if needed   Medication management -     CBC with Differential/Platelet -     COMPLETE METABOLIC PANEL WITH GFR -     Lipid panel -      TSH   Irritation of skin of perianal region Try to keep skin clean and dry Use lotrisone sparingly to effected area BID -     clotrimazole-betamethasone (LOTRISONE) cream; Apply 1 Application topically 2 (two) times daily.        Over 40 minutes of exam, counseling, chart review and critical decision making was performed Future Appointments  Date Time Provider Department Center  10/01/2023  3:10 PM [name], PA-C [facility] None  10/22/2023  1:30 PM [name], DO [facility] None  12/26/2023 10:30 AM Lucky Cowboy, MD [facility] None  03/30/2024 10:30 AM Raynelle Dick, NP [facility] None  08/06/2024 10:00 AM Lucky Cowboy, MD [facility] None  09/23/2024 10:00 AM Raynelle Dick, NP [facility] None     Plan:   During the course of the visit the patient was educated and counseled about appropriate screening and preventive services including:   Pneumococcal vaccine  Prevnar 13 Influenza vaccine Td vaccine Screening electrocardiogram Bone densitometry screening Colorectal cancer screening Diabetes screening Glaucoma screening Nutrition counseling  Advanced directives: requested   Subjective:  Debbie Houston is a 82 y.o. female who presents for Medicare Annual Wellness Visit and 3 month follow up.   She had fall with R hip/fremur fracture in June 2021 on vacation at the beach, had IM nail placed. She has displaced intertrochanteric fracture of right femur,  closed fracture with nonunion The bone did not heal correctly and is currently undergoing treatments with bone stimulator and following with Dr. Jena Gauss- bone stimulator currently, may require an additional surgery in the future  She had a DEXA 01/17/23 that showed left femoral neck with T -2.5. Following with Dr. Cleophas Dunker on prolia.  She is also doing eventia once a month.  She has improvement of her fingertips- had developed redness, warmth and swelling of fingertips 09/05/23- treated with zithromax x 10 days.    Pt had abnormal SPEP, was seen by Dr. Mosetta Putt- no signs of multiple myeloma. Continues to follow.  Pt is seeing Dr. Jacqulyn Bath in [facility] for ulcerative chronic pancolitis.  Has also had C.Diff this past year and has taken long course of Vancomycin. Is starting a new medication. Is now on Velsipity and to watch heart rate with use of this medication and bisoprolol. Continues on Vancomycin. Has an appointment for evaluation in December. She continues to have diarrhea and will normally have 5-6 episodes a day.   Continues to have issues with perianal area being irritated and red, painful and swollen.   Continues to Have neck pain that radiates into head. Tylenol will help some  Following with Dr. Lorrine Kin. Did PT and is to continue the exercises at home   She continues to have burning and numbness in hands, legs, feet. Gabapentin and cymbalta did not help   [facility] but her physician has retired.   BMI is Body mass index is 19.53 kg/m., she has been working on diet and exercise. Wt Readings from Last 3 Encounters:  09/23/23 112 lb (50.8 kg)  09/13/23 110 lb 12.8 oz (50.3 kg)  09/05/23 113 lb (51.3 kg)    Her blood pressure has been controlled at home runs 100-130/70-80's with Bisoprolol 5 mg every day , today their BP is BP: 108/68  BP Readings from Last 3 Encounters:  09/23/23 108/68  09/13/23 122/76  09/05/23 134/80  She does workout. She denies chest pain, shortness of breath, dizziness.   She is on cholesterol medication (simvastatin 20 mg, colestipol 2 g daily) and denies myalgias. Her cholesterol is at goal. The cholesterol last visit was:   Lab Results  Component Value Date   CHOL 173 07/31/2023   HDL 75 07/31/2023   LDLCALC 77 07/31/2023   TRIG 119 07/31/2023   CHOLHDL 2.3 07/31/2023   . She has been working on diet and exercise. and denies Last A1C in the office was:  Lab Results  Component Value Date   HGBA1C 5.6 07/31/2023   Last GFR: Lab  Results  Component Value Date   EGFR 77 06/18/2023    She is on thyroid medication. Currently taking Levothyroxine 125 mcg 1 tab daily Lab Results  Component Value Date   TSH 11.40 (H) 07/31/2023   Patient is on Vitamin D supplement.   Lab Results  Component Value Date   VD25OH 59 12/25/2022      Medication Review: Current Outpatient Medications on File Prior to Visit  Medication Sig Dispense Refill   acetaminophen (TYLENOL) 500 MG tablet Take 500 mg by mouth at bedtime as needed for moderate pain or headache.      Ascorbic Acid (VITAMIN C) 1000 MG tablet Take 1,000 mg by mouth daily.      aspirin EC 81 MG tablet Take 1 tablet (81 mg total) by mouth in the morning and at bedtime. (Patient taking differently: Take 81 mg by mouth daily.) 60 tablet 0   bisoprolol (ZEBETA) 5 MG tablet Take 1 tablet every Morning for BP 90 tablet 3   Calcium Carbonate-Vit D-Min (CALCIUM 1200 PO) Take 1,200 mg by mouth daily.     cetirizine (ZYRTEC ALLERGY) 10 MG tablet Take 10 mg by mouth daily.     CINNAMON PO  Take 2,000 mg by mouth daily.     Coenzyme Q10 (COQ10) 200 MG CAPS Take 200 mg by mouth daily.     COLESTIPOL HCL PO Take by mouth. 2 tabs BID     cycloSPORINE (RESTASIS) 0.05 % ophthalmic emulsion Place 1 drop into both eyes 2 (two) times daily.     Etrasimod Arginine (VELSIPITY) 2 MG TABS Take 2 mg by mouth daily.     Fluticasone Propionate (FLONASE NA) Place into the nose.     levothyroxine (SYNTHROID) 125 MCG tablet TAKE 1 TABLET BY MOUTH DAILY ON AN EMPTY STOMACH WITH ONLY WATER FOR 30 MINUTES(NO ANTACIDS MEDICATIONS, CALCIUM OR MAGNESIUM FOR 4 HOURS) (Patient taking differently: Take 125 mcg by mouth daily before breakfast.) 90 tablet 0   loperamide (IMODIUM) 2 MG capsule Take 4 mg by mouth as needed.     loratadine (CLARITIN) 10 MG tablet Take 10 mg by mouth daily as needed for allergies.     meclizine (ANTIVERT) 25 MG tablet 1/2-1 pill up to 3 times daily for motion sickness/dizziness 30  tablet 0   Mesalamine (ASACOL PO) Take by mouth. Takes 2 tablets, BID     Multiple Vitamin (THERA VITAMIN PO) Take 1 tablet by mouth daily.     Omega-3 Fatty Acids (FISH OIL) 1200 MG CAPS Take 1,200 mg by mouth daily.      pantoprazole (PROTONIX) 40 MG tablet Take 1 tablet  2 x /day  for Heartburn & Indigestion (Patient taking differently: Take 40 mg by mouth 2 (two) times daily.) 180 tablet 0   Probiotic Product (PROBIOTIC PO) Take 1 capsule by mouth daily.     PROLIA 60 MG/ML SOSY injection Inject 60 mg into the skin every 6 (six) months.     pseudoephedrine (SUDAFED) 120 MG 12 hr tablet Take  1 tablet  2 x /day (every 12 hours)  for Head and Chest Congestion (Patient taking differently: Take 120 mg by mouth daily as needed for congestion (Head and chest congestion).) 60 tablet 0   simvastatin (ZOCOR) 20 MG tablet Take 1 tablet at Bedtime for Cholesterol 90 tablet 3   Triamcinolone Acetonide (NASACORT ALLERGY 24HR NA) Place into the nose.     triamcinolone ointment (KENALOG) 0.5 % Apply 1 Application topically 2 (two) times daily. 30 g 0   vancomycin (VANCOCIN) 125 MG capsule Take by mouth.     No current facility-administered medications on file prior to visit.    Allergies  Allergen Reactions   Remicade [Infliximab]    Fosamax [Alendronate Sodium] Other (See Comments)    GI upset    Singulair [Montelukast Sodium] Other (See Comments)    Makes pt jittery   Wellbutrin [Bupropion] Hives   Clindamycin/Lincomycin Rash   Sulfa Antibiotics Rash    Current Problems (verified) Patient Active Problem List   Diagnosis Date Noted   Sepsis secondary to UTI (HCC) 06/24/2022   Extensor tenosynovitis of right wrist 06/23/2022   Thrombocytopenia (HCC) 06/23/2022   Prediabetes 06/23/2022   Intertrochanteric fracture of right femur, closed, with nonunion, subsequent encounter 01/22/2022   History of ulcerative colitis 07/10/2021   Complete right bundle branch block (RBBB) 05/15/2021   Body  mass index (BMI) of 19.0-19.9 in adult 05/12/2021   Bilateral nephrolithiasis 09/21/2020   Senile purpura (HCC) 08/17/2020   Bilateral temporomandibular joint pain 06/15/2020   Spondylolisthesis at L4-L5 level 11/25/2018   Essential hypertension    Hyperlipidemia, mixed    Hypothyroid    GERD    Asthma  DJD (degenerative joint disease)    Vitamin D deficiency    Osteoporosis with current pathological fracture    Allergy     Screening Tests Immunization History  Administered Date(s) Administered   Influenza, High Dose Seasonal PF 07/23/2014, 08/23/2015, 07/10/2016, 08/27/2017, 08/22/2018, 08/25/2019, 08/30/2020, 08/21/2021, 08/21/2022, 08/05/2023   Moderna SARS-COV2 Booster Vaccination 08/21/2022   PFIZER(Purple Top)SARS-COV-2 Vaccination 12/10/2019, 12/31/2019, 08/16/2020, 04/14/2021   Pneumococcal Conjugate-13 05/05/2015   Pneumococcal-Unspecified 08/22/2011   Tdap 08/22/2011   Unspecified SARS-COV-2 Vaccination 08/05/2023   Zoster Recombinant(Shingrix) 09/13/2017, 01/01/2018   Zoster, Live 06/20/2006    Health Maintenance  Topic Date Due   DTaP/Tdap/Td (2 - Td or Tdap) 09/22/2024 (Originally 08/21/2021)   Pneumonia Vaccine 63+ Years old (2 of 2 - PPSV23 or PCV20) 09/22/2024 (Originally 05/04/2016)   COVID-19 Vaccine (6 - 2023-24 season) 09/30/2023   MAMMOGRAM  01/17/2024   Medicare Annual Wellness (AWV)  09/22/2024   Colonoscopy  02/15/2028   INFLUENZA VACCINE  Completed   DEXA SCAN  Completed   Zoster Vaccines- Shingrix  Completed   HPV VACCINES  Aged Out   Hepatitis C Screening  Discontinued     Names of Other Physician/Practitioners you currently use: 1. [facility] here for primary care 2.Eye Exam Dr. Hyacinth Meeker 08/2023 3. Dentist 07/2023- Dr. Maurice March  Patient Care Team: Lucky Cowboy, MD as PCP - General (Internal Medicine) Osborn Coho, MD (Inactive) as Consulting Physician (Otolaryngology) Barnett Abu, MD as  Consulting Physician (Neurosurgery) Mammography, [place] (Diagnostic Radiology) Malachy Mood, MD as Consulting Physician (Hematology) Pollyann Savoy, MD as Consulting Physician (Rheumatology) Lucky Cowboy, MD as Referring Physician (Internal Medicine) Juanell Fairly, RN as [facility] Care Management Glendale Chard, DO as Consulting Physician (Neurology)  SURGICAL HISTORY She  has a past surgical history that includes Mohs surgery (2005); Tonsillectomy (age 30); Dilation and curettage of uterus; Dilatation & curettage/hysteroscopy with myosure (N/A, 10/27/2014); Back surgery (2020); Hip surgery (Right, 04/2020); Cataract extraction, bilateral (Bilateral, 2020); and Conversion to total hip (Right, 01/22/2022). FAMILY HISTORY Her family history includes Autism in her son; Autism spectrum disorder in her son; Cancer in her father, maternal grandmother, mother, and sister; Colitis in her son; Colon cancer (age of onset: 72) in her brother; Diabetes in her brother and maternal grandfather; Healthy in her son; Hyperlipidemia in her brother, father, maternal grandfather, maternal grandmother, mother, paternal grandfather, paternal grandmother, and sister; Hypertension in her brother and mother; Stroke in her maternal grandfather. SOCIAL HISTORY She  reports that she quit smoking about 22 years ago. Her smoking use included cigarettes. She started smoking about 37 years ago. She has a 3.8 pack-year smoking history. She has never been exposed to tobacco smoke. She has never used smokeless tobacco. She reports that she does not currently use alcohol. She reports that she does not use drugs.   MEDICARE WELLNESS OBJECTIVES: Physical activity: walking as tolerated  Cardiac risk factors: Cardiac Risk Factors include: advanced age (>43men, >59 women);dyslipidemia;hypertension Depression/mood screen:      09/23/2023   11:00 AM  Depression screen PHQ 2/9  Decreased Interest 0  Down, Depressed,  Hopeless 0  PHQ - 2 Score 0    ADLs:     09/23/2023   10:57 AM 12/24/2022   11:13 PM  In your present state of health, do you have any difficulty performing the following activities:  Hearing? 1 0  Comment wears hearing aids   Vision? 0 0  Difficulty concentrating or making decisions? 0 0  Walking  or climbing stairs? 1 0  Dressing or bathing? 0 0  Doing errands, shopping? 0 0  Preparing Food and eating ? N   Using the Toilet? N   In the past six months, have you accidently leaked urine? N   Do you have problems with loss of bowel control? Y   Comment pancolitis   Managing your Medications? N   Managing your Finances? N   Housekeeping or managing your Housekeeping? N       Cognitive Testing  Alert? Yes  Normal Appearance?Yes  Oriented to person? Yes  Place? Yes   Time? Yes  Recall of three objects?  Yes  Can perform simple calculations? Yes  Displays appropriate judgment?Yes  Can read the correct time from a watch face?Yes  EOL planning: Does Patient Have a Medical Advance Directive?: Yes Type of Advance Directive: Living will, Healthcare Power of Attorney Does patient want to make changes to medical advance directive?: No - Patient declined Copy of Healthcare Power of Attorney in Chart?: No - copy requested  Review of Systems  Constitutional:  Positive for malaise/fatigue and weight loss. Negative for chills, diaphoresis and fever.  HENT:  Negative for congestion, ear discharge, ear pain, nosebleeds, sinus pain, sore throat and tinnitus.   Eyes:  Negative for blurred vision, double vision, photophobia, pain, discharge and redness.  Respiratory:  Negative for cough, hemoptysis, sputum production, shortness of breath, wheezing and stridor.   Cardiovascular:  Negative for chest pain, palpitations, orthopnea, claudication, leg swelling and PND.  Gastrointestinal:  Positive for diarrhea. Negative for abdominal pain, blood in stool, constipation, heartburn, melena, nausea and  vomiting.       Hemorrhoids Irritation and tenderness of perianal region   Genitourinary:  Negative for dysuria, flank pain, frequency, hematuria and urgency.  Musculoskeletal:  Positive for back pain, joint pain (hands) and neck pain. Negative for falls and myalgias.       S/P lumbar fusion.  Skin:  Negative for itching and rash.  Neurological:  Positive for weakness. Negative for dizziness, tingling, tremors, sensory change, speech change, focal weakness, seizures, loss of consciousness and headaches.  Endo/Heme/Allergies:  Negative for environmental allergies and polydipsia. Bruises/bleeds easily.  Psychiatric/Behavioral:  Positive for depression. Negative for hallucinations, memory loss, substance abuse and suicidal ideas. The patient is not nervous/anxious and does not have insomnia.      Objective:     Today's Vitals   09/23/23 1029  BP: 108/68  Pulse: 70  Temp: 97.7 F (36.5 C)  SpO2: 98%  Weight: 112 lb (50.8 kg)  Height: 5' 3.5" (1.613 m)    Body mass index is 19.53 kg/m.  General appearance: thin pleasant female alert, no distress HEENT: normocephalic, sclerae anicteric, TMs pearly, nares patent, no discharge or erythema, pharynx normal Oral cavity: MMM, no lesions Neck: supple, no lymphadenopathy, no thyromegaly, no masses Heart: RRR, normal S1, S2, no murmurs Lungs: CTA bilaterally, no wheezes, rhonchi, or rales Abdomen: +bs, soft,  Musculoskeletal: Arthritic changes noted of hands Extremities: no edema, no cyanosis, no clubbing Pulses: 2+ symmetric, upper and lower extremities, normal cap refill Neurological: alert, oriented x 3, CN2-12 intact, strength normal upper extremities and lower extremities. Sensation decreased from feet to knees bilaterally DTRs 2+ throughout, no cerebellar signs, gait slow steady  Psychiatric: normal affect, behavior normal, pleasant  Derm: warm and dry. Erythema and mild swelling of perianal region and left buttock  Medicare  Attestation I have personally reviewed: The patient's medical and social history Their use of  alcohol, tobacco or illicit drugs Their current medications and supplements The patient's functional ability including ADLs,fall risks, home safety risks, cognitive, and hearing and visual impairment Diet and physical activities Evidence for depression or mood disorders  The patient's weight, height, BMI, and visual acuity have been recorded in the chart.  I have made referrals, counseling, and provided education to the patient based on review of the above and I have provided the patient with a written personalized care plan for preventive services.     Raynelle Dick, NP   09/23/2023

## 2023-09-23 ENCOUNTER — Ambulatory Visit (INDEPENDENT_AMBULATORY_CARE_PROVIDER_SITE_OTHER): Payer: Medicare Other | Admitting: Nurse Practitioner

## 2023-09-23 ENCOUNTER — Encounter: Payer: Self-pay | Admitting: Nurse Practitioner

## 2023-09-23 VITALS — BP 108/68 | HR 70 | Temp 97.7°F | Ht 63.5 in | Wt 112.0 lb

## 2023-09-23 DIAGNOSIS — R197 Diarrhea, unspecified: Principal | ICD-10-CM

## 2023-09-23 DIAGNOSIS — R42 Dizziness and giddiness: Secondary | ICD-10-CM

## 2023-09-23 DIAGNOSIS — R7309 Other abnormal glucose: Secondary | ICD-10-CM | POA: Diagnosis not present

## 2023-09-23 DIAGNOSIS — E559 Vitamin D deficiency, unspecified: Secondary | ICD-10-CM

## 2023-09-23 DIAGNOSIS — M159 Polyosteoarthritis, unspecified: Secondary | ICD-10-CM

## 2023-09-23 DIAGNOSIS — E782 Mixed hyperlipidemia: Secondary | ICD-10-CM

## 2023-09-23 DIAGNOSIS — I4510 Unspecified right bundle-branch block: Secondary | ICD-10-CM | POA: Diagnosis not present

## 2023-09-23 DIAGNOSIS — E039 Hypothyroidism, unspecified: Secondary | ICD-10-CM

## 2023-09-23 DIAGNOSIS — Z79899 Other long term (current) drug therapy: Secondary | ICD-10-CM | POA: Diagnosis not present

## 2023-09-23 DIAGNOSIS — J45909 Unspecified asthma, uncomplicated: Secondary | ICD-10-CM

## 2023-09-23 DIAGNOSIS — K529 Noninfective gastroenteritis and colitis, unspecified: Secondary | ICD-10-CM | POA: Diagnosis not present

## 2023-09-23 DIAGNOSIS — K5100 Ulcerative (chronic) pancolitis without complications: Secondary | ICD-10-CM

## 2023-09-23 DIAGNOSIS — M4316 Spondylolisthesis, lumbar region: Secondary | ICD-10-CM

## 2023-09-23 DIAGNOSIS — Z0001 Encounter for general adult medical examination with abnormal findings: Secondary | ICD-10-CM

## 2023-09-23 DIAGNOSIS — R6889 Other general symptoms and signs: Secondary | ICD-10-CM | POA: Diagnosis not present

## 2023-09-23 DIAGNOSIS — K6289 Other specified diseases of anus and rectum: Secondary | ICD-10-CM

## 2023-09-23 DIAGNOSIS — I10 Essential (primary) hypertension: Secondary | ICD-10-CM | POA: Diagnosis not present

## 2023-09-23 DIAGNOSIS — D692 Other nonthrombocytopenic purpura: Secondary | ICD-10-CM

## 2023-09-23 DIAGNOSIS — M8000XA Age-related osteoporosis with current pathological fracture, unspecified site, initial encounter for fracture: Secondary | ICD-10-CM | POA: Diagnosis not present

## 2023-09-23 DIAGNOSIS — K2100 Gastro-esophageal reflux disease with esophagitis, without bleeding: Secondary | ICD-10-CM | POA: Diagnosis not present

## 2023-09-23 DIAGNOSIS — Z0000 Encounter for general adult medical examination without abnormal findings: Secondary | ICD-10-CM

## 2023-09-23 MED ORDER — CLOTRIMAZOLE-BETAMETHASONE 1-0.05 % EX CREA: 1.0000 | TOPICAL_CREAM | Freq: Two times a day (BID) | CUTANEOUS | 2 refills | Status: DC

## 2023-09-23 MED ORDER — LOPERAMIDE 2 MG CAPSULE
ORAL_CAPSULE | 0 refills | 0 days
Start: 2023-09-23 — End: ?

## 2023-09-23 NOTE — Patient Instructions (Signed)
Perianal Dermatitis, Adult Perianal dermatitis is inflammation of the skin around the anus. This condition causes patches of red, irritated skin that may be itchy or painful. This condition is contagious. This means the condition can be passed from one person to another, depending on what caused it. What are the causes? This condition may be caused by: Contact with an irritant, such as stool, urine, mucus, soap, or sweat. Bacteria, especially certain types of strep bacteria. Swollen veins in the anus (hemorrhoids). Fungus. Skin conditions, such as psoriasis. Medical conditions, such as diabetes and Crohn's disease. Certain foods, especially spicy and acidic foods, and caffeinated beverages. An opening between the rectum and the skin around the anus (fistula). What increases the risk? This condition is more likely to develop in: People who are unable to control when they urinate or have a bowel movement (are incontinent). People who have trouble walking or moving around. People with thin or fragile skin. People who are taking antibiotic medicines or steroids. What are the signs or symptoms? Itchy, red patches of skin are the main symptom of this condition. The skin in this area may also be swollen and have: Cracks or tears. Small, raised bumps. Small, pus-filled blisters. Other symptoms include: Pain when passing stools. Blood in the stool. Itching around the anus. Tenderness around the anus. Holding back stools to avoid pain. How is this diagnosed? This condition is diagnosed with a medical history and physical exam. The diagnosis is confirmed by testing a sample of the skin for bacteria or fungus. How is this treated? Treatment for this condition depends on the cause. Mild cases may go away without treatment when contact with the irritant is stopped. Treatment for more severe cases of perianal dermatitis may include medicines, such as: A waterproof barrier cream. Topical or oral  corticosteroids to ease skin inflammation. Antihistamines to relieve itching. Antibiotics to treat a bacterial infection. Antifungal cream to treat a fungal infection. Severe fungal infections may also be treated with topical steroids and medicine to control itching. Follow these instructions at home:  Take over-the-counter and prescription medicines only as told by your health care provider. If you were prescribed an antibiotic medicine, use it as told by your health care provider. Do not stop using the antibiotic even if you start to feel better. Do not scratch the affected area. Wash your hands carefully with soap and warm water for at least 20 seconds after each time that you use the bathroom. If soap and water are not available, use hand sanitizer. Make sure other people in your household wash their hands frequently as well. Keep all follow-up visits. This is important. How is this prevented? Avoid contact with any substances that cause perianal inflammation. Keep the perianal area clean and dry. Contact a health care provider if: Your symptoms do not improve with treatment. Your symptoms get worse. Your symptoms go away and then return. You are not able to control your bowel movements or you are leaking stool (fecal incontinence). You have a fever. Get help right away if: You frequently pass blood in your stools. You lose weight and you do not know why. You have fluid, blood, or pus coming from the rash site. Summary Perianal dermatitis is inflammation of the skin around the anus. Itchy, red patches of skin are the main symptom of this condition. Treatment for this condition depends on the cause. Mild cases may go away without treatment when contact with the irritant is stopped. Treatment for more severe cases may include  medicines. Wash your hands carefully with soap and warm water for at least 20 seconds after each time that you use the bathroom. If soap and water are not  available, use hand sanitizer. Keep the perianal area clean and dry to prevent this condition. This information is not intended to replace advice given to you by your health care provider. Make sure you discuss any questions you have with your health care provider. Document Revised: 07/13/2020 Document Reviewed: 07/13/2020 Elsevier Patient Education  2024 [employer].

## 2023-09-24 ENCOUNTER — Telehealth: Payer: Self-pay | Admitting: Nurse Practitioner

## 2023-09-24 LAB — COMPLETE METABOLIC PANEL WITH GFR
AG Ratio: 2.1 (calc) (ref 1.0–2.5)
ALT: 14 U/L (ref 6–29)
AST: 19 U/L (ref 10–35)
Albumin: 4 g/dL (ref 3.6–5.1)
Alkaline phosphatase (APISO): 50 U/L (ref 37–153)
BUN: 10 mg/dL (ref 7–25)
CO2: 26 mmol/L (ref 20–32)
Calcium: 9.1 mg/dL (ref 8.6–10.4)
Chloride: 100 mmol/L (ref 98–110)
Creat: 0.77 mg/dL (ref 0.60–0.95)
Globulin: 1.9 g/dL (ref 1.9–3.7)
Glucose, Bld: 96 mg/dL (ref 65–139)
Potassium: 4.5 mmol/L (ref 3.5–5.3)
Sodium: 133 mmol/L — ABNORMAL LOW (ref 135–146)
Total Bilirubin: 0.5 mg/dL (ref 0.2–1.2)
Total Protein: 5.9 g/dL — ABNORMAL LOW (ref 6.1–8.1)
eGFR: 77 mL/min/{1.73_m2} (ref >=60)

## 2023-09-24 LAB — CBC WITH DIFFERENTIAL/PLATELET
Absolute Lymphocytes: 752 {cells}/uL — ABNORMAL LOW (ref 850–3900)
Absolute Monocytes: 989 {cells}/uL — ABNORMAL HIGH (ref 200–950)
Basophils Absolute: 62 {cells}/uL (ref 0–200)
Basophils Relative: 0.6 %
Eosinophils Absolute: 505 {cells}/uL — ABNORMAL HIGH (ref 15–500)
Eosinophils Relative: 4.9 %
HCT: 35.1 % (ref 35.0–45.0)
Hemoglobin: 11.8 g/dL (ref 11.7–15.5)
MCH: 33.2 pg — ABNORMAL HIGH (ref 27.0–33.0)
MCHC: 33.6 g/dL (ref 32.0–36.0)
MCV: 98.9 fL (ref 80.0–100.0)
MPV: 10.4 fL (ref 7.5–12.5)
Monocytes Relative: 9.6 %
Neutro Abs: 7993 {cells}/uL — ABNORMAL HIGH (ref 1500–7800)
Neutrophils Relative %: 77.6 %
Platelets: 209 10*3/uL (ref 140–400)
RBC: 3.55 10*6/uL — ABNORMAL LOW (ref 3.80–5.10)
RDW: 12.4 % (ref 11.0–15.0)
Total Lymphocyte: 7.3 %
WBC: 10.3 10*3/uL (ref 3.8–10.8)

## 2023-09-24 LAB — LIPID PANEL
Cholesterol: 133 mg/dL (ref <200)
HDL: 47 mg/dL — ABNORMAL LOW (ref >=50)
LDL Cholesterol (Calc): 70 mg/dL
Non-HDL Cholesterol (Calc): 86 mg/dL (ref <130)
Total CHOL/HDL Ratio: 2.8 (calc) (ref <5.0)
Triglycerides: 82 mg/dL (ref <150)

## 2023-09-24 LAB — TSH: TSH: 0.78 m[IU]/L (ref 0.40–4.50)

## 2023-09-24 MED ORDER — LOPERAMIDE 2 MG CAPSULE
ORAL_CAPSULE | 1 refills | 0 days | Status: CP
Start: 2023-09-24 — End: ?

## 2023-09-24 NOTE — Telephone Encounter (Signed)
Pt has a fever. That is her only symptom. Wants to know if you have any recommendations

## 2023-09-24 NOTE — Telephone Encounter (Signed)
Told patient to call GI doctor. She is going to let us know what they say.

## 2023-09-24 NOTE — Telephone Encounter (Signed)
Please have pt call GI doctor as she just started the new medication yesterday for her colitis.  Make sure it is not a drug reaction

## 2023-09-25 ENCOUNTER — Encounter (HOSPITAL_BASED_OUTPATIENT_CLINIC_OR_DEPARTMENT_OTHER): Payer: Self-pay | Admitting: Emergency Medicine

## 2023-09-25 ENCOUNTER — Emergency Department (HOSPITAL_BASED_OUTPATIENT_CLINIC_OR_DEPARTMENT_OTHER): Payer: Medicare Other

## 2023-09-25 ENCOUNTER — Inpatient Hospital Stay (HOSPITAL_BASED_OUTPATIENT_CLINIC_OR_DEPARTMENT_OTHER)
Admission: EM | Admit: 2023-09-25 | Discharge: 2023-09-28 | DRG: 394 | Disposition: A | Discharge status: Home / Self Care | Payer: Medicare Other | Attending: Internal Medicine | Admitting: Internal Medicine

## 2023-09-25 ENCOUNTER — Other Ambulatory Visit: Payer: Self-pay

## 2023-09-25 ENCOUNTER — Telehealth: Payer: Self-pay | Admitting: Nurse Practitioner

## 2023-09-25 DIAGNOSIS — K648 Other hemorrhoids: Secondary | ICD-10-CM | POA: Diagnosis present

## 2023-09-25 DIAGNOSIS — Z800 Family history of malignant neoplasm of digestive organs: Secondary | ICD-10-CM

## 2023-09-25 DIAGNOSIS — Z79890 Hormone replacement therapy (postmenopausal): Secondary | ICD-10-CM

## 2023-09-25 DIAGNOSIS — K603 Anal fistula, unspecified: Secondary | ICD-10-CM | POA: Diagnosis not present

## 2023-09-25 DIAGNOSIS — R6510 Systemic inflammatory response syndrome (SIRS) of non-infectious origin without acute organ dysfunction: Secondary | ICD-10-CM | POA: Insufficient documentation

## 2023-09-25 DIAGNOSIS — Z79899 Other long term (current) drug therapy: Secondary | ICD-10-CM

## 2023-09-25 DIAGNOSIS — K6289 Other specified diseases of anus and rectum: Secondary | ICD-10-CM | POA: Diagnosis not present

## 2023-09-25 DIAGNOSIS — E039 Hypothyroidism, unspecified: Secondary | ICD-10-CM | POA: Diagnosis not present

## 2023-09-25 DIAGNOSIS — I739 Peripheral vascular disease, unspecified: Secondary | ICD-10-CM | POA: Diagnosis not present

## 2023-09-25 DIAGNOSIS — L0390 Cellulitis, unspecified: Secondary | ICD-10-CM | POA: Diagnosis not present

## 2023-09-25 DIAGNOSIS — J45909 Unspecified asthma, uncomplicated: Secondary | ICD-10-CM | POA: Diagnosis not present

## 2023-09-25 DIAGNOSIS — Z8249 Family history of ischemic heart disease and other diseases of the circulatory system: Secondary | ICD-10-CM

## 2023-09-25 DIAGNOSIS — R933 Abnormal findings on diagnostic imaging of other parts of digestive tract: Secondary | ICD-10-CM | POA: Diagnosis not present

## 2023-09-25 DIAGNOSIS — Z823 Family history of stroke: Secondary | ICD-10-CM

## 2023-09-25 DIAGNOSIS — D259 Leiomyoma of uterus, unspecified: Secondary | ICD-10-CM | POA: Diagnosis not present

## 2023-09-25 DIAGNOSIS — Z9841 Cataract extraction status, right eye: Secondary | ICD-10-CM | POA: Diagnosis not present

## 2023-09-25 DIAGNOSIS — K6389 Other specified diseases of intestine: Secondary | ICD-10-CM | POA: Diagnosis not present

## 2023-09-25 DIAGNOSIS — H9190 Unspecified hearing loss, unspecified ear: Secondary | ICD-10-CM | POA: Diagnosis present

## 2023-09-25 DIAGNOSIS — K219 Gastro-esophageal reflux disease without esophagitis: Secondary | ICD-10-CM | POA: Insufficient documentation

## 2023-09-25 DIAGNOSIS — I4510 Unspecified right bundle-branch block: Secondary | ICD-10-CM | POA: Diagnosis present

## 2023-09-25 DIAGNOSIS — R7303 Prediabetes: Secondary | ICD-10-CM | POA: Diagnosis present

## 2023-09-25 DIAGNOSIS — L0231 Cutaneous abscess of buttock: Secondary | ICD-10-CM | POA: Diagnosis present

## 2023-09-25 DIAGNOSIS — Z882 Allergy status to sulfonamides status: Secondary | ICD-10-CM

## 2023-09-25 DIAGNOSIS — R609 Edema, unspecified: Secondary | ICD-10-CM | POA: Diagnosis not present

## 2023-09-25 DIAGNOSIS — N200 Calculus of kidney: Secondary | ICD-10-CM | POA: Diagnosis present

## 2023-09-25 DIAGNOSIS — N858 Other specified noninflammatory disorders of uterus: Secondary | ICD-10-CM | POA: Diagnosis not present

## 2023-09-25 DIAGNOSIS — Z7982 Long term (current) use of aspirin: Secondary | ICD-10-CM

## 2023-09-25 DIAGNOSIS — K644 Residual hemorrhoidal skin tags: Secondary | ICD-10-CM | POA: Diagnosis present

## 2023-09-25 DIAGNOSIS — Z9842 Cataract extraction status, left eye: Secondary | ICD-10-CM

## 2023-09-25 DIAGNOSIS — I10 Essential (primary) hypertension: Secondary | ICD-10-CM | POA: Diagnosis present

## 2023-09-25 DIAGNOSIS — E785 Hyperlipidemia, unspecified: Secondary | ICD-10-CM | POA: Diagnosis present

## 2023-09-25 DIAGNOSIS — Z8719 Personal history of other diseases of the digestive system: Secondary | ICD-10-CM | POA: Diagnosis not present

## 2023-09-25 DIAGNOSIS — E871 Hypo-osmolality and hyponatremia: Secondary | ICD-10-CM | POA: Diagnosis not present

## 2023-09-25 DIAGNOSIS — E782 Mixed hyperlipidemia: Secondary | ICD-10-CM

## 2023-09-25 DIAGNOSIS — I700 Atherosclerosis of aorta: Secondary | ICD-10-CM | POA: Diagnosis not present

## 2023-09-25 DIAGNOSIS — Z85828 Personal history of other malignant neoplasm of skin: Secondary | ICD-10-CM | POA: Diagnosis not present

## 2023-09-25 DIAGNOSIS — Z83438 Family history of other disorder of lipoprotein metabolism and other lipidemia: Secondary | ICD-10-CM

## 2023-09-25 DIAGNOSIS — R7982 Elevated C-reactive protein (CRP): Secondary | ICD-10-CM | POA: Diagnosis present

## 2023-09-25 DIAGNOSIS — Z888 Allergy status to other drugs, medicaments and biological substances status: Secondary | ICD-10-CM

## 2023-09-25 DIAGNOSIS — Z87891 Personal history of nicotine dependence: Secondary | ICD-10-CM | POA: Diagnosis not present

## 2023-09-25 DIAGNOSIS — K529 Noninfective gastroenteritis and colitis, unspecified: Secondary | ICD-10-CM | POA: Diagnosis not present

## 2023-09-25 DIAGNOSIS — D849 Immunodeficiency, unspecified: Secondary | ICD-10-CM | POA: Diagnosis not present

## 2023-09-25 DIAGNOSIS — K523 Indeterminate colitis: Secondary | ICD-10-CM | POA: Diagnosis present

## 2023-09-25 DIAGNOSIS — R197 Diarrhea, unspecified: Secondary | ICD-10-CM | POA: Diagnosis not present

## 2023-09-25 DIAGNOSIS — L03317 Cellulitis of buttock: Principal | ICD-10-CM | POA: Diagnosis present

## 2023-09-25 DIAGNOSIS — K52839 Microscopic colitis, unspecified: Secondary | ICD-10-CM

## 2023-09-25 DIAGNOSIS — Z833 Family history of diabetes mellitus: Secondary | ICD-10-CM

## 2023-09-25 LAB — CBC WITH DIFFERENTIAL/PLATELET
Abs Immature Granulocytes: 0.17 10*3/uL — ABNORMAL HIGH (ref 0.00–0.07)
Basophils Absolute: 0 10*3/uL (ref 0.0–0.1)
Basophils Relative: 0 %
Eosinophils Absolute: 0 10*3/uL (ref 0.0–0.5)
Eosinophils Relative: 0 %
HCT: 33.6 % — ABNORMAL LOW (ref 36.0–46.0)
Hemoglobin: 11.3 g/dL — ABNORMAL LOW (ref 12.0–15.0)
Immature Granulocytes: 1 %
Lymphocytes Relative: 4 %
Lymphs Abs: 0.6 10*3/uL — ABNORMAL LOW (ref 0.7–4.0)
MCH: 32.8 pg (ref 26.0–34.0)
MCHC: 33.6 g/dL (ref 30.0–36.0)
MCV: 97.4 fL (ref 80.0–100.0)
Monocytes Absolute: 1.2 10*3/uL — ABNORMAL HIGH (ref 0.1–1.0)
Monocytes Relative: 7 %
Neutro Abs: 14 10*3/uL — ABNORMAL HIGH (ref 1.7–7.7)
Neutrophils Relative %: 88 %
Platelets: 188 10*3/uL (ref 150–400)
RBC: 3.45 MIL/uL — ABNORMAL LOW (ref 3.87–5.11)
RDW: 13.5 % (ref 11.5–15.5)
WBC: 16 10*3/uL — ABNORMAL HIGH (ref 4.0–10.5)
nRBC: 0 % (ref 0.0–0.2)

## 2023-09-25 LAB — COMPREHENSIVE METABOLIC PANEL
ALT: 14 U/L (ref 0–44)
AST: 17 U/L (ref 15–41)
Albumin: 3.9 g/dL (ref 3.5–5.0)
Alkaline Phosphatase: 46 U/L (ref 38–126)
Anion gap: 8 (ref 5–15)
BUN: 10 mg/dL (ref 8–23)
CO2: 25 mmol/L (ref 22–32)
Calcium: 9.6 mg/dL (ref 8.9–10.3)
Chloride: 99 mmol/L (ref 98–111)
Creatinine, Ser: 0.79 mg/dL (ref 0.44–1.00)
GFR, Estimated: >60 mL/min (ref >60)
Glucose, Bld: 120 mg/dL — ABNORMAL HIGH (ref 70–99)
Potassium: 4 mmol/L (ref 3.5–5.1)
Sodium: 132 mmol/L — ABNORMAL LOW (ref 135–145)
Total Bilirubin: 0.8 mg/dL (ref <1.2)
Total Protein: 6.4 g/dL — ABNORMAL LOW (ref 6.5–8.1)

## 2023-09-25 LAB — LACTIC ACID, PLASMA: Lactic Acid, Venous: 0.8 mmol/L (ref 0.5–1.9)

## 2023-09-25 MED ORDER — SODIUM CHLORIDE 0.9 % IV SOLN
250.0000 mL | INTRAVENOUS | Status: AC | PRN
  Administered 2023-09-26: 250 mL via INTRAVENOUS

## 2023-09-25 MED ORDER — PIPERACILLIN-TAZOBACTAM 3.375 G IVPB
3.3750 g | Freq: Once | INTRAVENOUS | Status: AC
  Administered 2023-09-25: 3.375 g via INTRAVENOUS
  Filled 2023-09-25: qty 50

## 2023-09-25 MED ORDER — ENOXAPARIN SODIUM 30 MG/0.3ML IJ SOSY
30.0000 mg | PREFILLED_SYRINGE | INTRAMUSCULAR | Status: DC
Start: 2023-09-26 — End: 2023-09-28
  Administered 2023-09-26 – 2023-09-27 (×2): 30 mg via SUBCUTANEOUS
  Filled 2023-09-25 (×2): qty 0.3

## 2023-09-25 MED ORDER — IOHEXOL 300 MG/ML  SOLN
100.0000 mL | Freq: Once | INTRAMUSCULAR | Status: AC | PRN
  Administered 2023-09-25: 80 mL via INTRAVENOUS

## 2023-09-25 MED ORDER — BISACODYL 5 MG PO TBEC: 5.0000 mg | DELAYED_RELEASE_TABLET | Freq: Every day | ORAL | Status: DC | PRN

## 2023-09-25 MED ORDER — ONDANSETRON HCL 4 MG/2ML IJ SOLN
4.0000 mg | Freq: Four times a day (QID) | INTRAMUSCULAR | Status: DC | PRN
Start: 2023-09-25 — End: 2023-09-28

## 2023-09-25 MED ORDER — ACETAMINOPHEN 325 MG PO TABS
650.0000 mg | ORAL_TABLET | Freq: Four times a day (QID) | ORAL | Status: DC | PRN
  Administered 2023-09-26 – 2023-09-27 (×2): 650 mg via ORAL
  Filled 2023-09-25 (×2): qty 2

## 2023-09-25 MED ORDER — ACETAMINOPHEN 650 MG RE SUPP: 650.0000 mg | Freq: Four times a day (QID) | RECTAL | Status: DC | PRN

## 2023-09-25 MED ORDER — SODIUM CHLORIDE 0.9% FLUSH
3.0000 mL | Freq: Two times a day (BID) | INTRAVENOUS | Status: DC
  Administered 2023-09-28: 3 mL via INTRAVENOUS

## 2023-09-25 MED ORDER — ASPIRIN 81 MG PO TBEC
81.0000 mg | DELAYED_RELEASE_TABLET | Freq: Every day | ORAL | Status: DC
  Administered 2023-09-26 – 2023-09-28 (×3): 81 mg via ORAL
  Filled 2023-09-25 (×3): qty 1

## 2023-09-25 MED ORDER — ACETAMINOPHEN 325 MG PO TABS
650.0000 mg | ORAL_TABLET | Freq: Once | ORAL | Status: AC
  Administered 2023-09-25: 650 mg via ORAL
  Filled 2023-09-25: qty 2

## 2023-09-25 MED ORDER — HYDRALAZINE HCL 20 MG/ML IJ SOLN: 5.0000 mg | Freq: Four times a day (QID) | INTRAMUSCULAR | Status: DC | PRN

## 2023-09-25 MED ORDER — SODIUM CHLORIDE 0.9% FLUSH: 3.0000 mL | INTRAVENOUS | Status: DC | PRN

## 2023-09-25 MED ORDER — VANCOMYCIN HCL IN DEXTROSE 1-5 GM/200ML-% IV SOLN
1000.0000 mg | Freq: Once | INTRAVENOUS | Status: AC
  Administered 2023-09-25: 1000 mg via INTRAVENOUS
  Filled 2023-09-25: qty 200

## 2023-09-25 MED ORDER — ONDANSETRON HCL 4 MG PO TABS: 4.0000 mg | ORAL_TABLET | Freq: Four times a day (QID) | ORAL | Status: DC | PRN

## 2023-09-25 NOTE — ED Provider Notes (Signed)
[place] EMERGENCY DEPARTMENT AT [facility] Provider Note   CSN: [id number] Arrival date & time: 09/25/23  1544     History  Chief Complaint  Patient presents with   Abscess    Debbie Houston is a 82 y.o. female who presents emergency department with chief complaint of buttock abscess.  She is on mesalamine and disease modifying agent (Velsipity).  Patient reports 1 week of high fevers and worsening abscess encompassing the entirety of her left buttock.  Her sister is at bedside states "it looks terrible."  Today the abscess opened and patient is beginning to feel better.  She has not run a fever today.  She has not taken any occasion for pain.   Abscess      Home Medications Prior to Admission medications   Medication Sig Start Date End Date Taking? Authorizing Provider  acetaminophen (TYLENOL) 500 MG tablet Take 500 mg by mouth at bedtime as needed for moderate pain or headache.     [provider]  Ascorbic Acid (VITAMIN C) 1000 MG tablet Take 1,000 mg by mouth daily.     [provider]  aspirin EC 81 MG tablet Take 1 tablet (81 mg total) by mouth in the morning and at bedtime. Patient taking differently: Take 81 mg by mouth daily. 01/23/22   Joen Laura, MD  bisoprolol (ZEBETA) 5 MG tablet Take 1 tablet every Morning for BP 12/25/22   Lucky Cowboy, MD  Calcium Carbonate-Vit D-Min (CALCIUM 1200 PO) Take 1,200 mg by mouth daily.    [provider]  cetirizine (ZYRTEC ALLERGY) 10 MG tablet Take 10 mg by mouth daily.    [provider]  CINNAMON PO Take 2,000 mg by mouth daily.    [provider]  clotrimazole-betamethasone (LOTRISONE) cream Apply 1 Application topically 2 (two) times daily. 09/23/23   Raynelle Dick, NP  Coenzyme Q10 (COQ10) 200 MG CAPS Take 200 mg by mouth daily.    [provider]  COLESTIPOL HCL PO Take by mouth. 2 tabs BID    [provider]  cycloSPORINE (RESTASIS) 0.05 %  ophthalmic emulsion Place 1 drop into both eyes 2 (two) times daily.    [provider]  Etrasimod Arginine (VELSIPITY) 2 MG TABS Take 2 mg by mouth daily. 09/16/23   Adela Glimpse, NP  Fluticasone Propionate (FLONASE NA) Place into the nose.    [provider]  levothyroxine (SYNTHROID) 125 MCG tablet TAKE 1 TABLET BY MOUTH DAILY ON AN EMPTY STOMACH WITH ONLY WATER FOR 30 MINUTES(NO ANTACIDS MEDICATIONS, CALCIUM OR MAGNESIUM FOR 4 HOURS) Patient taking differently: Take 125 mcg by mouth daily before breakfast. 12/18/21   Raynelle Dick, NP  loperamide (IMODIUM) 2 MG capsule Take 4 mg by mouth as needed. 06/04/22   [provider]  loratadine (CLARITIN) 10 MG tablet Take 10 mg by mouth daily as needed for allergies.    [provider]  meclizine (ANTIVERT) 25 MG tablet 1/2-1 pill up to 3 times daily for motion sickness/dizziness 08/12/23   Raynelle Dick, NP  Mesalamine (ASACOL PO) Take by mouth. Takes 2 tablets, BID    [provider]  Multiple Vitamin (THERA VITAMIN PO) Take 1 tablet by mouth daily.    [provider]  Omega-3 Fatty Acids (FISH OIL) 1200 MG CAPS Take 1,200 mg by mouth daily.     [provider]  pantoprazole (PROTONIX) 40 MG tablet Take 1 tablet  2 x /day  for  Heartburn & Indigestion Patient taking differently: Take 40 mg by mouth 2 (two) times daily. 03/08/20   Lucky Cowboy, MD  Probiotic Product (PROBIOTIC PO) Take 1 capsule by mouth daily.    [provider]  PROLIA 60 MG/ML SOSY injection Inject 60 mg into the skin every 6 (six) months. 01/28/20   [provider]  pseudoephedrine (SUDAFED) 120 MG 12 hr tablet Take  1 tablet  2 x /day (every 12 hours)  for Head and Chest Congestion Patient taking differently: Take 120 mg by mouth daily as needed for congestion (Head and chest congestion). 11/15/21   Lucky Cowboy, MD  simvastatin (ZOCOR) 20 MG tablet Take 1 tablet at Bedtime for  Cholesterol 02/26/23   Adela Glimpse, NP  Triamcinolone Acetonide (NASACORT ALLERGY 24HR NA) Place into the nose.    [provider]  triamcinolone ointment (KENALOG) 0.5 % Apply 1 Application topically 2 (two) times daily. 09/05/23   Raynelle Dick, NP  vancomycin (VANCOCIN) 125 MG capsule Take by mouth.    [provider]      Allergies    Remicade [infliximab], Fosamax [alendronate sodium], Singulair [montelukast sodium], Wellbutrin [bupropion], Clindamycin/lincomycin, and Sulfa antibiotics    Review of Systems   Review of Systems  Physical Exam Updated Vital Signs BP (!) 140/79 (BP Location: Right Arm)   Pulse 92   Temp 98.7 F (37.1 C)   Resp 18   SpO2 97%  Physical Exam Vitals and nursing note reviewed.  Constitutional:      General: She is not in acute distress.    Appearance: She is well-developed. She is not diaphoretic.  HENT:     Head: Normocephalic and atraumatic.     Right Ear: External ear normal.     Left Ear: External ear normal.     Nose: Nose normal.     Mouth/Throat:     Mouth: Mucous membranes are moist.  Eyes:     General: No scleral icterus.    Conjunctiva/sclera: Conjunctivae normal.  Cardiovascular:     Rate and Rhythm: Normal rate and regular rhythm.     Heart sounds: Normal heart sounds. No murmur heard.    No friction rub. No gallop.  Pulmonary:     Effort: Pulmonary effort is normal. No respiratory distress.     Breath sounds: Normal breath sounds.  Abdominal:     General: Bowel sounds are normal. There is no distension.     Palpations: Abdomen is soft. There is no mass.     Tenderness: There is no abdominal tenderness. There is no guarding.  Musculoskeletal:     Cervical back: Normal range of motion.  Skin:    General: Skin is warm and dry.     Comments: Left buttock is diffusely erythematous with exquisite tenderness.  There is a large violaceous appearing area of fluctuance with purulent and sanguinous discharge.   The entire left buttock is indurated and painful to touch.  Neurological:     Mental Status: She is alert and oriented to person, place, and time.  Psychiatric:        Behavior: Behavior normal.     ED Results / Procedures / Treatments   Labs (all labs ordered are listed, but only abnormal results are displayed) Labs Reviewed  CULTURE, BLOOD (ROUTINE X 2)  CULTURE, BLOOD (ROUTINE X 2)  LACTIC ACID, PLASMA  LACTIC ACID, PLASMA  COMPREHENSIVE METABOLIC PANEL  CBC WITH DIFFERENTIAL/PLATELET    EKG None  Radiology No results found.  Procedures Procedures    Medications Ordered in ED Medications - No data to display  ED Course/ Medical Decision Making/ A&P Clinical Course as of 09/27/23 1331  Wed Sep 25, 2023  1737 WBC(!): 16.0 [AH]  1903 Awaiting CT read, admit for IV antibiotics, wound check, fever, elevated wbc [JB]  2203 Med Surg, admit  [JB]    Clinical Course User Index [AH] Arthor Captain, PA-C [JB] Barrett, Horald Chestnut, PA-C                                 Medical Decision Making 82 y/o f with immunocompromise on DMT for UC who presents with one week of fever and LARGE abscess of the left buttock. CT pendings Patient started on ABx (vanc and zosyn) Suspect she will need obs vs. admission Discussed with PA Barret at shift hand off who will follow up on the ct read and admit (+/- surgical consult based on findings)  Amount and/or Complexity of Data Reviewed Labs: ordered. Decision-making details documented in ED Course.    Details: Glu- 129 WBC 16 Mild anemia  Radiology: ordered. ECG/medicine tests: ordered.  Risk OTC drugs. Prescription drug management. Decision regarding hospitalization.           Final Clinical Impression(s) / ED Diagnoses Final diagnoses:  None    Rx / DC Orders ED Discharge Orders     None         Arthor Captain, PA-C 09/27/23 1338    Benjiman Core, MD 09/30/23 1555

## 2023-09-25 NOTE — ED Triage Notes (Signed)
Fever earlier today,  pain in left  buttock "I think its an abscess" started 1 week ago. Given abt ointment by provider but no improvement

## 2023-09-25 NOTE — Telephone Encounter (Signed)
Still having a fever? What did GI say?

## 2023-09-25 NOTE — H&P (Signed)
History and Physical    Debbie Houston:[id number] DOB: 04/06/1941 DOA: 09/25/2023  PCP: Lucky Cowboy, MD   Patient coming from: Home   Chief Complaint:  Chief Complaint  Patient presents with   Abscess    HPI:  Debbie Houston is a 82 y.o. female with medical history significant of essential hypertension, hypothyroidism, GERD, hyperlipidemia,  hypothyroidism and microscopic colitis presented to emergency department for evaluation presented to emergency department for evaluation for fever for 1 week and worsening cellulitis and pain of her left buttock.  Patient reported that she was given and antibiotic ointment by her provider without any improvement of the symptoms.  During my evaluation at the bedside patient denies any abdominal pain, nausea, vomiting, diarrhea and rectal pain.  Patient reported usually she has 4-5 bowel movements in a day.  Denies any rectal pain/burning sensation with bowel movement.  Denies any abdominal pain with bowel movement. No other complaint at this time.  Per chart review patient follows gastroenterologist at [facility].  Last clinic visit in 07/29/2023,  Recommended to continue Stelara every 8 weeks for her indeterminate colitis.  Continue Asacol 2 tabs twice daily; Colestipol 2 tabs twice daily, and prednisone 10 mg daily for diarrhea.  ED Course:  At presentation to ED patient's heart rate 92, blood pressure 140/79 and O2 sat 97% room air. CBC showing leukocytosis 16, hemoglobin 11.3 and hematocrit 33.  Platelet count 188. Serum sodium 132, blood glucose 120 otherwise unremarkable.  CT abdomen pelvis showed IMPRESSION: 1. Rectal wall thickening with perianal fat stranding extending into the gluteal region and perineum on the left. No abscess or fistula is seen. 2. Findings compatible with pelvic floor dysfunction. 3. Bilateral nephrolithiasis. 4. Uterine fibroids. 5. Aortic atherosclerosis.  With the concern for proctitis in the ED patient has  been treated with vancomycin and Zosyn.  Blood cultures has been obtained.  Spoke with on-call GI Dr. Shea Evans who recommended to check ESR, CRP, GI panel, C. difficile panel and continue broad-spectrum IV antibiotic coverage.  No suspicion of colitis flare at this time and no recommendation for to start any steroid at this moment.  Will evaluate patient in the morning.  ED physician discussed case with on-call hospitalist and patient has been accepted transfer to [facility] for further evaluation.  Review of Systems:  ROS  Past Medical History:  Diagnosis Date   Arthritis    Benign labile hypertension    Cancer (HCC)    skin cancer on face   Colitis    DJD (degenerative joint disease)    Endometrial polyp    GERD (gastroesophageal reflux disease)    History of basal cell carcinoma excision    History of esophagitis    HOH (hard of hearing)    right ear better than left  -- wears no aides   Hyperlipidemia    Hypothyroidism    Neuromuscular disorder (HCC)    neuropathy in feet   Osteopenia    Prediabetes    S/P right hip fracture 08/17/2020   Sepsis (HCC) 2023   per patient   Vitamin D deficiency    Wears glasses     Past Surgical History:  Procedure Laterality Date   BACK SURGERY  2020   CATARACT EXTRACTION, BILATERAL Bilateral 2020   CONVERSION TO TOTAL HIP Right 01/22/2022   Procedure: CONVERSION TO TOTAL HIP;  Surgeon: Joen Laura, MD;  Location: [facility];  Service: Orthopedics;  Laterality: Right;   DILATATION & CURETTAGE/HYSTEROSCOPY WITH MYOSURE  N/A 10/27/2014   Procedure: DILATATION & CURETTAGE/HYSTEROSCOPY WITH MYOSURE;  Surgeon: Juluis Mire, MD;  Location: [facility];  Service: Gynecology;  Laterality: N/A;   DILATION AND CURETTAGE OF UTERUS     HIP SURGERY Right 04/2020   rod and screw in right hip    MOHS SURGERY  2005   LEFT NASAL BRIDGE FOR BASAL CELL   TONSILLECTOMY  age 17     reports that she quit smoking about 22 years ago.  Her smoking use included cigarettes. She started smoking about 37 years ago. She has a 3.8 pack-year smoking history. She has never been exposed to tobacco smoke. She has never used smokeless tobacco. She reports that she does not currently use alcohol. She reports that she does not use drugs.  Allergies  Allergen Reactions   Remicade [Infliximab]    Fosamax [Alendronate Sodium] Other (See Comments)    GI upset    Singulair [Montelukast Sodium] Other (See Comments)    Makes pt jittery   Wellbutrin [Bupropion] Hives   Clindamycin/Lincomycin Rash   Sulfa Antibiotics Rash    Family History  Problem Relation Age of Onset   Hypertension Mother    Cancer Mother        COLON   Hyperlipidemia Mother    Cancer Father        BREAST WITH BRAIN METS   Hyperlipidemia Father    Cancer Sister        COLON (TEENS), BREAST (50)   Hyperlipidemia Sister    Diabetes Brother    Hyperlipidemia Brother    Hypertension Brother    Colon cancer Brother 12   Cancer Maternal Grandmother        RENAL   Hyperlipidemia Maternal Grandmother    Diabetes Maternal Grandfather    Stroke Maternal Grandfather    Hyperlipidemia Maternal Grandfather    Hyperlipidemia Paternal Grandmother    Hyperlipidemia Paternal Grandfather    Autism Son    Colitis Son    Autism spectrum disorder Son    Healthy Son     Prior to Admission medications   Medication Sig Start Date End Date Taking? Authorizing Provider  acetaminophen (TYLENOL) 500 MG tablet Take 500 mg by mouth at bedtime as needed for moderate pain or headache.     [provider]  Ascorbic Acid (VITAMIN C) 1000 MG tablet Take 1,000 mg by mouth daily.     [provider]  aspirin EC 81 MG tablet Take 1 tablet (81 mg total) by mouth in the morning and at bedtime. Patient taking differently: Take 81 mg by mouth daily. 01/23/22   Joen Laura, MD  bisoprolol (ZEBETA) 5 MG tablet Take 1 tablet every Morning for BP 12/25/22   Lucky Cowboy, MD  Calcium Carbonate-Vit D-Min (CALCIUM 1200 PO) Take 1,200 mg by mouth daily.    [provider]  cetirizine (ZYRTEC ALLERGY) 10 MG tablet Take 10 mg by mouth daily.    [provider]  CINNAMON PO Take 2,000 mg by mouth daily.    [provider]  clotrimazole-betamethasone (LOTRISONE) cream Apply 1 Application topically 2 (two) times daily. 09/23/23   Raynelle Dick, NP  Coenzyme Q10 (COQ10) 200 MG CAPS Take 200 mg by mouth daily.    [provider]  COLESTIPOL HCL PO Take by mouth. 2 tabs BID    [provider]  cycloSPORINE (RESTASIS) 0.05 % ophthalmic emulsion Place 1 drop into both eyes 2 (two) times daily.  [provider]  Etrasimod Arginine (VELSIPITY) 2 MG TABS Take 2 mg by mouth daily. 09/16/23   Adela Glimpse, NP  Fluticasone Propionate (FLONASE NA) Place into the nose.    [provider]  levothyroxine (SYNTHROID) 125 MCG tablet TAKE 1 TABLET BY MOUTH DAILY ON AN EMPTY STOMACH WITH ONLY WATER FOR 30 MINUTES(NO ANTACIDS MEDICATIONS, CALCIUM OR MAGNESIUM FOR 4 HOURS) Patient taking differently: Take 125 mcg by mouth daily before breakfast. 12/18/21   Raynelle Dick, NP  loperamide (IMODIUM) 2 MG capsule Take 4 mg by mouth as needed. 06/04/22   [provider]  loratadine (CLARITIN) 10 MG tablet Take 10 mg by mouth daily as needed for allergies.    [provider]  meclizine (ANTIVERT) 25 MG tablet 1/2-1 pill up to 3 times daily for motion sickness/dizziness 08/12/23   Raynelle Dick, NP  Mesalamine (ASACOL PO) Take by mouth. Takes 2 tablets, BID    [provider]  Multiple Vitamin (THERA VITAMIN PO) Take 1 tablet by mouth daily.    [provider]  Omega-3 Fatty Acids (FISH OIL) 1200 MG CAPS Take 1,200 mg by mouth daily.     [provider]  pantoprazole (PROTONIX) 40 MG tablet Take 1 tablet  2 x /day  for Heartburn & Indigestion Patient taking differently:  Take 40 mg by mouth 2 (two) times daily. 03/08/20   Lucky Cowboy, MD  Probiotic Product (PROBIOTIC PO) Take 1 capsule by mouth daily.    [provider]  PROLIA 60 MG/ML SOSY injection Inject 60 mg into the skin every 6 (six) months. 01/28/20   [provider]  pseudoephedrine (SUDAFED) 120 MG 12 hr tablet Take  1 tablet  2 x /day (every 12 hours)  for Head and Chest Congestion Patient taking differently: Take 120 mg by mouth daily as needed for congestion (Head and chest congestion). 11/15/21   Lucky Cowboy, MD  simvastatin (ZOCOR) 20 MG tablet Take 1 tablet at Bedtime for Cholesterol 02/26/23   Adela Glimpse, NP  Triamcinolone Acetonide (NASACORT ALLERGY 24HR NA) Place into the nose.    [provider]  triamcinolone ointment (KENALOG) 0.5 % Apply 1 Application topically 2 (two) times daily. 09/05/23   Raynelle Dick, NP  vancomycin (VANCOCIN) 125 MG capsule Take by mouth.    [provider]     Physical Exam: Vitals:   09/25/23 1800 09/25/23 1900 09/25/23 2007 09/25/23 2318  BP: 131/75 132/75  128/69  Pulse: 91 87  83  Resp: 18 18  16   Temp:   98.5 F (36.9 C) 98.7 F (37.1 C)  TempSrc:   Oral Oral  SpO2: 95% 91%  96%    Physical Exam   Labs on Admission: I have personally reviewed following labs and imaging studies  CBC: Recent Labs  Lab 09/23/23 1110 09/25/23 1708  WBC 10.3 16.0*  NEUTROABS 7,993* 14.0*  HGB 11.8 11.3*  HCT 35.1 33.6*  MCV 98.9 97.4  PLT 209 188   Basic Metabolic Panel: Recent Labs  Lab 09/23/23 1110 09/25/23 1708  NA 133* 132*  K 4.5 4.0  CL 100 99  CO2 26 25  GLUCOSE 96 120*  BUN 10 10  CREATININE 0.77 0.79  CALCIUM 9.1 9.6   GFR: Estimated Creatinine Clearance: 43.5 mL/min (by C-G formula based on SCr of 0.79 mg/dL). Liver Function Tests: Recent Labs  Lab 09/23/23 1110 09/25/23 1708  AST 19 17  ALT 14 14  ALKPHOS  --  46  BILITOT 0.5 0.8  PROT 5.9* 6.4*  ALBUMIN  --  3.9   No  results for input(s): "LIPASE", "AMYLASE" in the last 168 hours. No results for input(s): "AMMONIA" in the last 168 hours. Coagulation Profile: No results for input(s): "INR", "PROTIME" in the last 168 hours. Cardiac Enzymes: No results for input(s): "CKTOTAL", "CKMB", "CKMBINDEX", "TROPONINI", "TROPONINIHS" in the last 168 hours. BNP (last 3 results) No results for input(s): "BNP" in the last 8760 hours. HbA1C: No results for input(s): "HGBA1C" in the last 72 hours. CBG: No results for input(s): "GLUCAP" in the last 168 hours. Lipid Profile: Recent Labs    09/23/23 1110  CHOL 133  HDL 47*  LDLCALC 70  TRIG 82  CHOLHDL 2.8   Thyroid Function Tests: Recent Labs    09/23/23 1110  TSH 0.78   Anemia Panel: No results for input(s): "VITAMINB12", "FOLATE", "FERRITIN", "TIBC", "IRON", "RETICCTPCT" in the last 72 hours. Urine analysis:    Component Value Date/Time   COLORURINE YELLOW 07/31/2023 1008   APPEARANCEUR CLEAR 07/31/2023 1008   LABSPEC 1.006 07/31/2023 1008   PHURINE 6.0 07/31/2023 1008   GLUCOSEU NEGATIVE 07/31/2023 1008   HGBUR NEGATIVE 07/31/2023 1008   BILIRUBINUR NEGATIVE 06/23/2022 0015   KETONESUR NEGATIVE 07/31/2023 1008   PROTEINUR NEGATIVE 07/31/2023 1008   UROBILINOGEN 0.2 05/05/2015 1030   NITRITE NEGATIVE 07/31/2023 1008   LEUKOCYTESUR NEGATIVE 07/31/2023 1008    Radiological Exams on Admission: I have personally reviewed images CT PELVIS W CONTRAST  Result Date: 09/25/2023 CLINICAL DATA:  Perianal abscess or fistula suspected. Pain in left buttock for 1 week. EXAM: CT PELVIS WITH CONTRAST TECHNIQUE: Multidetector CT imaging of the pelvis was performed using the standard protocol following the bolus administration of intravenous contrast. RADIATION DOSE REDUCTION: This exam was performed according to the departmental dose-optimization program which includes automated exposure control, adjustment of the mA and/or kV according to patient size and/or use  of iterative reconstruction technique. CONTRAST:  80mL OMNIPAQUE IOHEXOL 300 MG/ML  SOLN COMPARISON:  06/23/2022. FINDINGS: Urinary Tract: Nonobstructive renal calculi are present bilaterally. There is no hydronephrosis. The bladder is unremarkable. Bowel: No bowel obstruction, free air, or pneumatosis. Rectal wall thickening is noted. There is perianal fat stranding on the left extending into the gluteal region and perineum on the left. No abscess or fistula is seen. There is pelvic floor laxity with inferior displacement of the rectum below the pubococcygeal line Vascular/Lymphatic: Aortic atherosclerosis. No pelvic lymphadenopathy is seen. Reproductive: Coarse calcifications are noted in the uterus, compatible with degenerating fibroids. No adnexal mass is seen. Other:  No ascites. Musculoskeletal: Total hip arthroplasty changes are present on the right. Lumbar spinal fusion hardware is present at L4-L5. There is a compression deformity with augmentation changes at L3. IMPRESSION: 1. Rectal wall thickening with perianal fat stranding extending into the gluteal region and perineum on the left. No abscess or fistula is seen. 2. Findings compatible with pelvic floor dysfunction. 3. Bilateral nephrolithiasis. 4. Uterine fibroids. 5. Aortic atherosclerosis. Electronically Signed   By: Thornell Sartorius M.D.   On: 09/25/2023 20:23    EKG: My personal interpretation of EKG shows:      Assessment/Plan: Principal Problem:   Proctitis Active Problems:   History of microscopic colitis   Essential hypertension   Hyperlipidemia   Hypothyroidism   GERD (gastroesophageal reflux disease)   Hyponatremia    Assessment and Plan: Proctitis History of microscopic colitis -Patient presenting with complaining of fever of 1 week and left-sided  buttock pain with concern for development of abscess/cellulitis per patient.  Treating with outpatient topical antibiotic without any improvement per patient. - Presentation to  ED patient is afebrile.  Mild leukocytosis 16. -In the ED patient has been treated with IV vancomycin and Zosyn. -Per chart review,Patient follows outpatient GI with [facility].  Last clinic visit on 07/29/2023.  Per chart review of gastroenterologist note patient is currently on Stelara every 8 weeks. For diarrhea control, continue Asacol 2 tabs twice daily; Colestipol 2 tabs twice daily, and prednisone 10 mg daily for diarrhea.  -Spoke with on-call [place] [facility] Dr. Shea Evans who recommended to check ESR, CRP, GI panel, C. difficile panel and continue broad-spectrum IV antibiotic coverage.  No suspicion of colitis flare at this time and no recommendation for to start any steroid at this moment.  Will evaluate patient in the morning. - Plan to continue continue IV vancomycin and Zosyn pharmacy consult - Pending blood cultures. -Pending blood cultures, ESR, CRP GI and C. difficile panel. - Pending pharmacy reconciliation of home medications.  History of chronic diarrhea - At bedside patient verified that patient takes colestipol.  Continue colestipol 1 g twice daily. -Holding loperamide until C. difficile panel rules out  Hyponatremia -Slight low sodium 132.  Continue to monitor serum sodium level. - Checking serum osmolarity, urine sodium and urine osmolarity.  Hypothyroidism -Waiting to verify with pharmacy before starting/resuming home levothyroxine.  Essential hypertension -Patient's blood pressure and heart rate within normal range in the ED.  Waiting for verify with pharmacy if patient taking bisoprolol at home or not.  Unable to verify with patient at the bedside -Continue hydralazine as needed.  Hyperlipidemia -Waiting verification of pharmacy patient taking Zocor at home or not.  Unable to verify with patient at the bedside  Have not resumed any home medications yet as waiting for pharmacy for home medication reconciliation  DVT prophylaxis:  Lovenox Code Status:  Full Code Diet:  Heart healthy diet Family Communication: No family member at bedside now Disposition Plan: Tentative discharge to home next 1 to 2 days. Consults: [facility] Admission status:   Observation, Med-Surg  Severity of Illness: The appropriate patient status for this patient is OBSERVATION. Observation status is judged to be reasonable and necessary in order to provide the required intensity of service to ensure the patient's safety. The patient's presenting symptoms, physical exam findings, and initial radiographic and laboratory data in the context of their medical condition is felt to place them at decreased risk for further clinical deterioration. Furthermore, it is anticipated that the patient will be medically stable for discharge from the hospital within 2 midnights of admission.     Tereasa Coop, MD [facility]  How to contact the [facility] Attending or Consulting provider 7A - 7P or covering provider during after hours 7P -7A, for this patient.  Check the care team in [facility] and look for a) attending/consulting [facility] provider listed and b) the [facility] team listed Log into [email or web address] and use [facility]'s universal password to access. If you do not have the password, please contact the hospital operator. Locate the [facility] provider you are looking for under [facility] and page to a number that you can be directly reached. If you still have difficulty reaching the provider, please page the [facility] (Director on Call) for the Hospitalists listed on amion for assistance.  09/26/2023, 1:44 AM

## 2023-09-25 NOTE — Progress Notes (Signed)
Plan of Care Note for accepted transfer   Patient: Debbie Houston MRN: [id number]   DOA: 09/25/2023  Facility requesting transfer: [facility]  Requesting Provider: Jasmine Pang, PA; Benjiman Core, MD  Reason for transfer: Cellulitis / proctitis  Facility course:   82 y/o F hx UC, immunosuppressed. with 1 wk of fever, cellulitis to the L buttock. CT with rectal wall thickening, perianal and perineal stranding, no organized abscess. Admitted for initial IV Abx to ensure improving. Got Vanc / Zosyn at ED. May need GI involvement for possible UC flare once here, not contacted yet by outside ED   Plan of care: The patient is accepted for admission to Med-surg  unit, at [facility] or [facility]    Author: Dolly Rias, MD 09/25/2023  Check [email or web address] for on-call coverage.  Nursing staff, Please call [facility] Admits & Consults System-Wide number on Amion as soon as patient's arrival, so appropriate admitting provider can evaluate the pt.

## 2023-09-25 NOTE — Progress Notes (Signed)
ED Pharmacy Antibiotic Sign Off An antibiotic consult was received from an ED provider for vancomycin/zosyn per pharmacy dosing for cellulitis. A chart review was completed to assess appropriateness.   The following one time order(s) were placed:  Vanc 1g loading dose Piperacillin-tazobactam 3.375 g  Further antibiotic and/or antibiotic pharmacy consults should be ordered by the admitting provider if indicated.   Thank you for allowing pharmacy to be a part of this patient's care.   Rutherford Nail, PharmD PGY2 Critical Care Pharmacy Resident 09/25/23 5:13 PM

## 2023-09-25 NOTE — Telephone Encounter (Signed)
Pt said you referred her to a GI doctor in [place]. She would rather go to a GI doctor in [place]. Do you have any recommendations.

## 2023-09-25 NOTE — Telephone Encounter (Signed)
LMOM to call back

## 2023-09-25 NOTE — ED Notes (Signed)
ED TO INPATIENT HANDOFF REPORT  ED Nurse Name and Phone #:   S Name/Age/Gender Debbie Houston 82 y.o. female Room/Bed: DB007/DB007  Code Status   Code Status: Prior  Home/SNF/Other Home Patient oriented to: self, place, time, and situation Is this baseline? Yes   Triage Complete: Triage complete  Chief Complaint Proctitis [K62.89]  Triage Note Fever earlier today,  pain in left  buttock "I think its an abscess" started 1 week ago. Given abt ointment by provider but no improvement     Allergies Allergies  Allergen Reactions   Remicade [Infliximab]    Fosamax [Alendronate Sodium] Other (See Comments)    GI upset    Singulair [Montelukast Sodium] Other (See Comments)    Makes pt jittery   Wellbutrin [Bupropion] Hives   Clindamycin/Lincomycin Rash   Sulfa Antibiotics Rash    Level of Care/Admitting Diagnosis ED Disposition     ED Disposition  Admit   Condition  --   Comment  Hospital Area: [facility] [[id number]]  Level of Care: Med-Surg [16]  Interfacility transfer: Yes  May place patient in observation at [facility] or [facility] if equivalent level of care is available:: Yes  Covid Evaluation: Asymptomatic - no recent exposure (last 10 days) testing not required  Diagnosis: Proctitis [161096]  Admitting Physician: Dolly Rias [[id number]]  Attending Physician: Dolly Rias [[id number]]          B Medical/Surgery History Past Medical History:  Diagnosis Date   Arthritis    Benign labile hypertension    Cancer (HCC)    skin cancer on face   Colitis    DJD (degenerative joint disease)    Endometrial polyp    GERD (gastroesophageal reflux disease)    History of basal cell carcinoma excision    History of esophagitis    HOH (hard of hearing)    right ear better than left  -- wears no aides   Hyperlipidemia    Hypothyroidism    Neuromuscular disorder (HCC)    neuropathy in feet   Osteopenia    Prediabetes    S/P right  hip fracture 08/17/2020   Sepsis (HCC) 2023   per patient   Vitamin D deficiency    Wears glasses    Past Surgical History:  Procedure Laterality Date   BACK SURGERY  2020   CATARACT EXTRACTION, BILATERAL Bilateral 2020   CONVERSION TO TOTAL HIP Right 01/22/2022   Procedure: CONVERSION TO TOTAL HIP;  Surgeon: Joen Laura, MD;  Location: [facility];  Service: Orthopedics;  Laterality: Right;   DILATATION & CURETTAGE/HYSTEROSCOPY WITH MYOSURE N/A 10/27/2014   Procedure: DILATATION & CURETTAGE/HYSTEROSCOPY WITH MYOSURE;  Surgeon: Juluis Mire, MD;  Location[facility];  Service: Gynecology;  Laterality: N/A;   DILATION AND CURETTAGE OF UTERUS     HIP SURGERY Right 04/2020   rod and screw in right hip    MOHS SURGERY  2005   LEFT NASAL BRIDGE FOR BASAL CELL   TONSILLECTOMY  age 46     A IV Location/Drains/Wounds Patient Lines/Drains/Airways Status     Active Line/Drains/Airways     Name Placement date Placement time Site Days   Peripheral IV 09/25/23 20 G 1" Left Antecubital 09/25/23  1718  Antecubital  less than 1            Intake/Output Last 24 hours  Intake/Output Summary (Last 24 hours) at 09/25/2023 2209 Last data filed at 09/25/2023 1838 Gross per 24 hour  Intake 178.14 ml  Output --  Net 178.14 ml    Labs/Imaging Results for orders placed or performed during the hospital encounter of 09/25/23 (from the past 48 hour(s))  Blood Culture (routine x 2)     Status: None (Preliminary result)   Collection Time: 09/25/23  4:57 PM   Specimen: BLOOD  Result Value Ref Range   Specimen Description      BLOOD RIGHT ANTECUBITAL Performed at [facility], [address], [place], [place] [address]    Special Requests      BOTTLES DRAWN AEROBIC AND ANAEROBIC Blood Culture results may not be optimal due to an excessive volume of blood received in culture bottles Performed at [facility], [address] [place], [place]  [address]    Culture PENDING    Report Status PENDING   Lactic acid, plasma     Status: None   Collection Time: 09/25/23  5:08 PM  Result Value Ref Range   Lactic Acid, Venous 0.8 0.5 - 1.9 mmol/L    Comment: Performed at [facility], [address], [place], [place] [address]  Comprehensive metabolic panel     Status: Abnormal   Collection Time: 09/25/23  5:08 PM  Result Value Ref Range   Sodium 132 (L) 135 - 145 mmol/L   Potassium 4.0 3.5 - 5.1 mmol/L   Chloride 99 98 - 111 mmol/L   CO2 25 22 - 32 mmol/L   Glucose, Bld 120 (H) 70 - 99 mg/dL    Comment: Glucose reference range applies only to samples taken after fasting for at least 8 hours.   BUN 10 8 - 23 mg/dL   Creatinine, Ser 9.14 0.44 - 1.00 mg/dL   Calcium 9.6 8.9 - 78.2 mg/dL   Total Protein 6.4 (L) 6.5 - 8.1 g/dL   Albumin 3.9 3.5 - 5.0 g/dL   AST 17 15 - 41 U/L   ALT 14 0 - 44 U/L   Alkaline Phosphatase 46 38 - 126 U/L   Total Bilirubin 0.8 <1.2 mg/dL   GFR, Estimated >95 >62 mL/min    Comment: (NOTE) Calculated using the CKD-EPI Creatinine Equation (2021)    Anion gap 8 5 - 15    Comment: Performed at [facility], [address] [place], [place] [address]  CBC with Differential     Status: Abnormal   Collection Time: 09/25/23  5:08 PM  Result Value Ref Range   WBC 16.0 (H) 4.0 - 10.5 K/uL   RBC 3.45 (L) 3.87 - 5.11 MIL/uL   Hemoglobin 11.3 (L) 12.0 - 15.0 g/dL   HCT 57.8 (L) 46.9 - 62.9 %   MCV 97.4 80.0 - 100.0 fL   MCH 32.8 26.0 - 34.0 pg   MCHC 33.6 30.0 - 36.0 g/dL   RDW 52.8 41.3 - 24.4 %   Platelets 188 150 - 400 K/uL   nRBC 0.0 0.0 - 0.2 %   Neutrophils Relative % 88 %   Neutro Abs 14.0 (H) 1.7 - 7.7 K/uL   Lymphocytes Relative 4 %   Lymphs Abs 0.6 (L) 0.7 - 4.0 K/uL   Monocytes Relative 7 %   Monocytes Absolute 1.2 (H) 0.1 - 1.0 K/uL   Eosinophils Relative 0 %   Eosinophils Absolute 0.0 0.0 - 0.5 K/uL   Basophils Relative 0 %   Basophils Absolute 0.0 0.0  - 0.1 K/uL   Immature Granulocytes 1 %   Abs Immature Granulocytes 0.17 (H) 0.00 -  0.07 K/uL    Comment: Performed at [facility], [address], [place], [place] [address]   CT PELVIS W CONTRAST  Result Date: 09/25/2023 CLINICAL DATA:  Perianal abscess or fistula suspected. Pain in left buttock for 1 week. EXAM: CT PELVIS WITH CONTRAST TECHNIQUE: Multidetector CT imaging of the pelvis was performed using the standard protocol following the bolus administration of intravenous contrast. RADIATION DOSE REDUCTION: This exam was performed according to the departmental dose-optimization program which includes automated exposure control, adjustment of the mA and/or kV according to patient size and/or use of iterative reconstruction technique. CONTRAST:  80mL OMNIPAQUE IOHEXOL 300 MG/ML  SOLN COMPARISON:  06/23/2022. FINDINGS: Urinary Tract: Nonobstructive renal calculi are present bilaterally. There is no hydronephrosis. The bladder is unremarkable. Bowel: No bowel obstruction, free air, or pneumatosis. Rectal wall thickening is noted. There is perianal fat stranding on the left extending into the gluteal region and perineum on the left. No abscess or fistula is seen. There is pelvic floor laxity with inferior displacement of the rectum below the pubococcygeal line Vascular/Lymphatic: Aortic atherosclerosis. No pelvic lymphadenopathy is seen. Reproductive: Coarse calcifications are noted in the uterus, compatible with degenerating fibroids. No adnexal mass is seen. Other:  No ascites. Musculoskeletal: Total hip arthroplasty changes are present on the right. Lumbar spinal fusion hardware is present at L4-L5. There is a compression deformity with augmentation changes at L3. IMPRESSION: 1. Rectal wall thickening with perianal fat stranding extending into the gluteal region and perineum on the left. No abscess or fistula is seen. 2. Findings compatible with pelvic floor dysfunction. 3. Bilateral  nephrolithiasis. 4. Uterine fibroids. 5. Aortic atherosclerosis. Electronically Signed   By: Thornell Sartorius M.D.   On: 09/25/2023 20:23    Pending Labs Unresulted Labs (From admission, onward)     Start     Ordered   09/25/23 1652  Blood Culture (routine x 2)  (Undifferentiated presentation (screening labs and basic nursing orders))  BLOOD CULTURE X 2,   STAT      09/25/23 1653            Vitals/Pain Today's Vitals   09/25/23 1745 09/25/23 1800 09/25/23 1900 09/25/23 2007  BP:  131/75 132/75   Pulse: 92 91 87   Resp: 19 18 18    Temp:    98.5 F (36.9 C)  TempSrc:    Oral  SpO2: 94% 95% 91%   PainSc:        Isolation Precautions No active isolations  Medications Medications  vancomycin (VANCOCIN) IVPB 1000 mg/200 mL premix (0 mg Intravenous Stopped 09/25/23 1838)  piperacillin-tazobactam (ZOSYN) IVPB 3.375 g (0 g Intravenous Stopped 09/25/23 1825)  iohexol (OMNIPAQUE) 300 MG/ML solution 100 mL (80 mLs Intravenous Contrast Given 09/25/23 1830)  acetaminophen (TYLENOL) tablet 650 mg (650 mg Oral Given 09/25/23 2130)    Mobility walks     Focused Assessments    R Recommendations: See Admitting Provider Note  Report given to:   Additional Notes:

## 2023-09-25 NOTE — ED Provider Notes (Signed)
  Physical Exam  BP 132/75   Pulse 87   Temp 98.5 F (36.9 C) (Oral)   Resp 18   SpO2 91%   Physical Exam Skin:    Findings: Erythema present.     Comments: Significant area of erythema and edema surrounding the left buttock, area is painful and warm to touch.     Procedures  Procedures  ED Course / MDM   Clinical Course as of 09/25/23 2203  Wed Sep 25, 2023  1737 WBC(!): 16.0 [AH]  1903 Awaiting CT read, admit for IV antibiotics, wound check, fever, elevated wbc [JB]  2203 Med Surg, admit  [JB]    Clinical Course User Index [AH] Arthor Captain, PA-C [JB] Dairon Procter, Horald Chestnut, PA-C   Medical Decision Making Amount and/or Complexity of Data Reviewed Labs: ordered. Decision-making details documented in ED Course. Radiology: ordered. ECG/medicine tests: ordered.  Risk OTC drugs. Prescription drug management. Decision regarding hospitalization.   Patient was given to me at shift handoff by Abby PA.  Patient is immunocompromise with history of 1 week of high fevers and worsening cellulitis and pain of left buttocks.  Patient reports that she was given an antibiotic ointment by her provider without any improvement in her symptoms.  Here patient has elevated white blood cell count, history of high fever but otherwise hemodynamically stable.  Blood cultures were drawn and IV antibiotics were administered.  Patient reports pain, was given Tylenol.   CT of the pelvis with contrast shows:  Rectal wall thickening with perianal fat stranding extending into the gluteal region and perineum on the left. No abscess or fistula is seen.  I have consulted hospitalist for admission, IV antibiotics. Agree to admission, patient will be transferred.        Reinaldo Raddle 09/25/23 2202    Benjiman Core, MD 09/26/23 0010

## 2023-09-25 NOTE — Unmapped (Signed)
Mychart message 09/24/23  C/o rectal pain, swelling, temp 102, and chills. Cannot sit without discomfort.  Eval by PCP yesterday and given topical meds for perianal dermatitis.    Per Dr. Jacqulyn Bath, pt advised to go to ED to be eval for abscess. Prefer [facility] ED.  We will schedule a clinic appointment 11/11 at 1330 also.    Patient verbalized understanding and agreed with POC

## 2023-09-26 ENCOUNTER — Inpatient Hospital Stay (HOSPITAL_COMMUNITY): Payer: Medicare Other

## 2023-09-26 DIAGNOSIS — R609 Edema, unspecified: Secondary | ICD-10-CM | POA: Diagnosis not present

## 2023-09-26 DIAGNOSIS — L0231 Cutaneous abscess of buttock: Secondary | ICD-10-CM | POA: Diagnosis present

## 2023-09-26 DIAGNOSIS — E871 Hypo-osmolality and hyponatremia: Secondary | ICD-10-CM | POA: Diagnosis present

## 2023-09-26 DIAGNOSIS — I739 Peripheral vascular disease, unspecified: Secondary | ICD-10-CM | POA: Diagnosis not present

## 2023-09-26 DIAGNOSIS — K603 Anal fistula, unspecified: Secondary | ICD-10-CM | POA: Diagnosis not present

## 2023-09-26 DIAGNOSIS — L03317 Cellulitis of buttock: Secondary | ICD-10-CM | POA: Diagnosis present

## 2023-09-26 DIAGNOSIS — R933 Abnormal findings on diagnostic imaging of other parts of digestive tract: Secondary | ICD-10-CM | POA: Diagnosis not present

## 2023-09-26 DIAGNOSIS — K529 Noninfective gastroenteritis and colitis, unspecified: Secondary | ICD-10-CM

## 2023-09-26 DIAGNOSIS — D849 Immunodeficiency, unspecified: Secondary | ICD-10-CM | POA: Diagnosis present

## 2023-09-26 DIAGNOSIS — D259 Leiomyoma of uterus, unspecified: Secondary | ICD-10-CM | POA: Diagnosis present

## 2023-09-26 DIAGNOSIS — Z85828 Personal history of other malignant neoplasm of skin: Secondary | ICD-10-CM | POA: Diagnosis not present

## 2023-09-26 DIAGNOSIS — K219 Gastro-esophageal reflux disease without esophagitis: Secondary | ICD-10-CM | POA: Diagnosis present

## 2023-09-26 DIAGNOSIS — Z9841 Cataract extraction status, right eye: Secondary | ICD-10-CM | POA: Diagnosis not present

## 2023-09-26 DIAGNOSIS — E039 Hypothyroidism, unspecified: Secondary | ICD-10-CM | POA: Diagnosis present

## 2023-09-26 DIAGNOSIS — I10 Essential (primary) hypertension: Secondary | ICD-10-CM | POA: Diagnosis present

## 2023-09-26 DIAGNOSIS — N200 Calculus of kidney: Secondary | ICD-10-CM | POA: Diagnosis present

## 2023-09-26 DIAGNOSIS — Z8719 Personal history of other diseases of the digestive system: Secondary | ICD-10-CM | POA: Diagnosis not present

## 2023-09-26 DIAGNOSIS — K52839 Microscopic colitis, unspecified: Secondary | ICD-10-CM | POA: Diagnosis not present

## 2023-09-26 DIAGNOSIS — Z9842 Cataract extraction status, left eye: Secondary | ICD-10-CM | POA: Diagnosis not present

## 2023-09-26 DIAGNOSIS — Z87891 Personal history of nicotine dependence: Secondary | ICD-10-CM | POA: Diagnosis not present

## 2023-09-26 DIAGNOSIS — H9190 Unspecified hearing loss, unspecified ear: Secondary | ICD-10-CM | POA: Diagnosis present

## 2023-09-26 DIAGNOSIS — Z79899 Other long term (current) drug therapy: Secondary | ICD-10-CM | POA: Diagnosis not present

## 2023-09-26 DIAGNOSIS — Z79890 Hormone replacement therapy (postmenopausal): Secondary | ICD-10-CM | POA: Diagnosis not present

## 2023-09-26 DIAGNOSIS — K648 Other hemorrhoids: Secondary | ICD-10-CM | POA: Diagnosis present

## 2023-09-26 DIAGNOSIS — E785 Hyperlipidemia, unspecified: Secondary | ICD-10-CM | POA: Diagnosis present

## 2023-09-26 DIAGNOSIS — L0390 Cellulitis, unspecified: Secondary | ICD-10-CM | POA: Diagnosis not present

## 2023-09-26 DIAGNOSIS — Z882 Allergy status to sulfonamides status: Secondary | ICD-10-CM | POA: Diagnosis not present

## 2023-09-26 DIAGNOSIS — R7303 Prediabetes: Secondary | ICD-10-CM | POA: Diagnosis present

## 2023-09-26 DIAGNOSIS — J45909 Unspecified asthma, uncomplicated: Secondary | ICD-10-CM | POA: Diagnosis present

## 2023-09-26 DIAGNOSIS — R197 Diarrhea, unspecified: Secondary | ICD-10-CM | POA: Diagnosis not present

## 2023-09-26 DIAGNOSIS — I700 Atherosclerosis of aorta: Secondary | ICD-10-CM | POA: Diagnosis present

## 2023-09-26 DIAGNOSIS — K6389 Other specified diseases of intestine: Secondary | ICD-10-CM | POA: Diagnosis not present

## 2023-09-26 DIAGNOSIS — K644 Residual hemorrhoidal skin tags: Secondary | ICD-10-CM | POA: Diagnosis present

## 2023-09-26 DIAGNOSIS — K6289 Other specified diseases of anus and rectum: Secondary | ICD-10-CM | POA: Diagnosis present

## 2023-09-26 LAB — COMPREHENSIVE METABOLIC PANEL
ALT: 15 U/L (ref 0–44)
AST: 17 U/L (ref 15–41)
Albumin: 3.1 g/dL — ABNORMAL LOW (ref 3.5–5.0)
Alkaline Phosphatase: 47 U/L (ref 38–126)
Anion gap: 11 (ref 5–15)
BUN: 8 mg/dL (ref 8–23)
CO2: 23 mmol/L (ref 22–32)
Calcium: 8.9 mg/dL (ref 8.9–10.3)
Chloride: 101 mmol/L (ref 98–111)
Creatinine, Ser: 0.79 mg/dL (ref 0.44–1.00)
GFR, Estimated: >60 mL/min (ref >60)
Glucose, Bld: 102 mg/dL — ABNORMAL HIGH (ref 70–99)
Potassium: 3.5 mmol/L (ref 3.5–5.1)
Sodium: 135 mmol/L (ref 135–145)
Total Bilirubin: 0.6 mg/dL (ref <1.2)
Total Protein: 5.9 g/dL — ABNORMAL LOW (ref 6.5–8.1)

## 2023-09-26 LAB — CBC
HCT: 32.9 % — ABNORMAL LOW (ref 36.0–46.0)
Hemoglobin: 10.8 g/dL — ABNORMAL LOW (ref 12.0–15.0)
MCH: 32.1 pg (ref 26.0–34.0)
MCHC: 32.8 g/dL (ref 30.0–36.0)
MCV: 97.9 fL (ref 80.0–100.0)
Platelets: 194 10*3/uL (ref 150–400)
RBC: 3.36 MIL/uL — ABNORMAL LOW (ref 3.87–5.11)
RDW: 13.4 % (ref 11.5–15.5)
WBC: 12.8 10*3/uL — ABNORMAL HIGH (ref 4.0–10.5)
nRBC: 0 % (ref 0.0–0.2)

## 2023-09-26 LAB — SODIUM, URINE, RANDOM: Sodium, Ur: 31 mmol/L

## 2023-09-26 LAB — C DIFFICILE QUICK SCREEN W PCR REFLEX
C Diff antigen: NEGATIVE
C Diff interpretation: NOT DETECTED
C Diff toxin: NEGATIVE

## 2023-09-26 LAB — OSMOLALITY: Osmolality: 281 mosm/kg (ref 275–295)

## 2023-09-26 LAB — OSMOLALITY, URINE: Osmolality, Ur: 288 mosm/kg — ABNORMAL LOW (ref 300–900)

## 2023-09-26 LAB — SEDIMENTATION RATE: Sed Rate: 54 mm/h — ABNORMAL HIGH (ref 0–22)

## 2023-09-26 LAB — C-REACTIVE PROTEIN: CRP: 15.1 mg/dL — ABNORMAL HIGH (ref <1.0)

## 2023-09-26 MED ORDER — VANCOMYCIN HCL 750 MG/150ML IV SOLN
750.0000 mg | INTRAVENOUS | Status: DC
  Administered 2023-09-26 – 2023-09-27 (×2): 750 mg via INTRAVENOUS
  Filled 2023-09-26 (×4): qty 150

## 2023-09-26 MED ORDER — LEVOTHYROXINE SODIUM 25 MCG PO TABS
125.0000 ug | ORAL_TABLET | Freq: Every day | ORAL | Status: DC
Start: 2023-09-27 — End: 2023-09-28
  Administered 2023-09-28: 125 ug via ORAL
  Filled 2023-09-26: qty 1

## 2023-09-26 MED ORDER — COLESTIPOL HCL 1 G PO TABS
1.0000 g | ORAL_TABLET | Freq: Two times a day (BID) | ORAL | Status: DC
Start: 2023-09-26 — End: 2023-09-28
  Administered 2023-09-26 – 2023-09-28 (×5): 1 g via ORAL
  Filled 2023-09-26 (×6): qty 1

## 2023-09-26 MED ORDER — PIPERACILLIN-TAZOBACTAM 3.375 G IVPB
3.3750 g | Freq: Three times a day (TID) | INTRAVENOUS | Status: DC
  Administered 2023-09-26 – 2023-09-28 (×8): 3.375 g via INTRAVENOUS
  Filled 2023-09-26 (×8): qty 50

## 2023-09-26 MED ORDER — SIMVASTATIN 20 MG PO TABS
20.0000 mg | ORAL_TABLET | Freq: Every day | ORAL | Status: DC
  Administered 2023-09-27: 20 mg via ORAL
  Filled 2023-09-26: qty 1

## 2023-09-26 NOTE — Progress Notes (Signed)
PT  arrived from [facility] by [facility] at this time. Oriented x4. Denies pain  /nausea at this time. Bed in low position with alarm on.Call light within reach.

## 2023-09-26 NOTE — Progress Notes (Signed)
PROGRESS NOTE    Debbie Houston  ONG:[id number] DOB: June 18, 1941 DOA: 09/25/2023 PCP: Lucky Cowboy, MD    Brief Narrative:   Debbie Houston is a 82 y.o. female with past medical history significant for HTN, HLD, hypothyroidism, GERD, microscopic colitis who presented to [facility] ED on 11/6 for fever, erythema to left buttock progressing over the last week.  She reports has been given antibiotic ointment by her PCP without improvement of symptoms.  Patient reports at baseline has 4-5 bowel movements daily, denies any abdominal pain, no nausea/vomiting, no rectal pain.  Follows with gastroenterology at [facility].  Last clinic visit 07/29/2023.  Was previously on Stelara every 8 weeks for indeterminate colitis, Asacol 2 tabs twice daily, colestipol 2 tabs twice daily and prednisone 10 mg p.o. daily but recently changed from Stelara to Velsipity.  Also with history of C. difficile, recently treated few weeks prior and remains on prophylactic dose of p.o. vancomycin.  In the ED, temperature 98.7 F, HR 92, RR 18, BP 140/79, SpO2 97% on room air.  WBC 16.0, hemoglobin 11.3, platelet count 188.  Sodium 132, potassium 4.0, chloride 99, CO2 25, glucose 120, BUN 10, creatinine 0.79.  AST 17, ALT 14, total bilirubin 0.8.  CT pelvis with contrast with rectal wall thickening, perianal fat stranding extending into the gluteal region and perineum on the left, no abscess or fistula seen, findings compatible with pelvic floor dysfunction, bilateral nephrolithiasis, uterine fibroids, aortic atherosclerosis.  EDP concerned with proctitis, started on vancomycin and Zosyn.  Discussed with on-call GI, Dr. Meridee Score; recommended check ESR, CRP, GI PCR panel, C. difficile panel and continue broad-spectrum IV antibiotics.  Not suspicious for colitis flare at this time and no recommendation to start any steroid treatment.  [facility] consulted for admission for further evaluation management of left buttock cellulitis, concern for  proctitis  Assessment & Plan:   Proctitis Left buttock cellulitis Patient presenting to ED with 1 week history of fever, left-sided buttock pain with development of cellulitis and concern for abscess.  Treated previously with outpatient topical antibiotic ointment without improvement.  WBC elevated 16.0, ESR/CRP elevated.  CT  pelvis with contrast with rectal wall thickening, perianal fat stranding extending into the gluteal region and perineum on the left, no abscess or fistula seen. -- [place] gastro[facility]ology following, appreciate assistance -- WBC 16.0>12.8 -- Ultrasound soft tissue left buttock: Pending -- Vancomycin, pharmacy consulted for dosing/monitoring -- Zosyn 3.375 g IV every 8 hours --Continue enteric precautions -- CBC daily  History of microscopic colitis Follows with gastroenterology [facility].  Current regimen includes prednisone, Velsipity, Asacol. -- [facility] following as above -- Colestipol 1 g p.o. twice daily -- C. difficile PCR: Negative -- GI PCR panel: Pending -- Fecal calprotectin: Pending   Essential hypertension -- Hold home bisoprolol for now  Hyperlipidemia -- Simvastatin 20 mg p.o. daily  Hypothyroidism -- Levothyroxine 25 mcg p.o. daily    DVT prophylaxis: enoxaparin (LOVENOX) injection 30 mg Start: 09/26/23 1000 SCDs Start: 09/25/23 2355 Place TED hose Start: 09/25/23 2355    Code Status: Full Code Family Communication:   Disposition Plan:  Level of care: Med-Surg Status is: Inpatient Remains inpatient appropriate because: IV antibiotics    Consultants:  [place] gastroenterology  Procedures:  Ultrasound soft tissue left buttock: Pending  Antimicrobials:  Vancomycin 11/6>> Zosyn 11/6>>   Subjective: Patient seen examined bedside, resting calmly.  Lying in bed.  Continues with pressure/pain to left buttock.  No fever since arrival to hospital.  Seen by GI  this morning.  Obtaining ultrasound for further evaluation of left buttock  cellulitis with concern for abscess given drainage.  WBC count improving.  Continues with diarrhea, although this is chronic for her.  Pending stool studies.  Objective: Vitals:   09/25/23 2007 09/25/23 2318 09/26/23 0500 09/26/23 0738  BP:  128/69 110/60 110/60  Pulse:  83 73 73  Resp:  16 18 18   Temp: 98.5 F (36.9 C) 98.7 F (37.1 C) 98.3 F (36.8 C) 98.3 F (36.8 C)  TempSrc: Oral Oral Oral Oral  SpO2:  96% 96%   Weight:   49.2 kg 49.2 kg  Height:    5' 3.5" (1.613 m)    Intake/Output Summary (Last 24 hours) at 09/26/2023 1347 Last data filed at 09/26/2023 1139 Gross per 24 hour  Intake 585.14 ml  Output --  Net 585.14 ml   Filed Weights   09/26/23 0500 09/26/23 0738  Weight: 49.2 kg 49.2 kg    Examination:  Physical Exam: GEN: NAD, alert and oriented x 3, thin in appearance HEENT: NCAT, PERRL, EOMI, sclera clear, MMM PULM: CTAB w/o wheezes/crackles, normal respiratory effort, on room air CV: RRR w/o M/G/R GI: abd soft, NTND, NABS, no R/G/M MSK: no peripheral edema, muscle strength globally intact 5/5 bilateral upper/lower extremities NEURO: CN II-XII intact, no focal deficits, sensation to light touch intact PSYCH: normal mood/affect Integumentary: Left buttock region with erythema, tenderness to palpation fluctuance with purulent drainage as depicted below, otherwise no other concerning rashes/lesions/wounds noted exposed skin surfaces    Data Reviewed: I have personally reviewed following labs and imaging studies  CBC: Recent Labs  Lab 09/23/23 1110 09/25/23 1708 09/26/23 0908  WBC 10.3 16.0* 12.8*  NEUTROABS 7,993* 14.0*  --   HGB 11.8 11.3* 10.8*  HCT 35.1 33.6* 32.9*  MCV 98.9 97.4 97.9  PLT 209 188 194   Basic Metabolic Panel: Recent Labs  Lab 09/23/23 1110 09/25/23 1708 09/26/23 0908  NA 133* 132* 135  K 4.5 4.0 3.5  CL 100 99 101  CO2 26 25 23   GLUCOSE 96 120* 102*  BUN 10 10 8   CREATININE 0.77 0.79 0.79  CALCIUM 9.1 9.6 8.9    GFR: Estimated Creatinine Clearance: 42.1 mL/min (by C-G formula based on SCr of 0.79 mg/dL). Liver Function Tests: Recent Labs  Lab 09/23/23 1110 09/25/23 1708 09/26/23 0908  AST 19 17 17   ALT 14 14 15   ALKPHOS  --  46 47  BILITOT 0.5 0.8 0.6  PROT 5.9* 6.4* 5.9*  ALBUMIN  --  3.9 3.1*   No results for input(s): "LIPASE", "AMYLASE" in the last 168 hours. No results for input(s): "AMMONIA" in the last 168 hours. Coagulation Profile: No results for input(s): "INR", "PROTIME" in the last 168 hours. Cardiac Enzymes: No results for input(s): "CKTOTAL", "CKMB", "CKMBINDEX", "TROPONINI" in the last 168 hours. BNP (last 3 results) No results for input(s): "PROBNP" in the last 8760 hours. HbA1C: No results for input(s): "HGBA1C" in the last 72 hours. CBG: No results for input(s): "GLUCAP" in the last 168 hours. Lipid Profile: No results for input(s): "CHOL", "HDL", "LDLCALC", "TRIG", "CHOLHDL", "LDLDIRECT" in the last 72 hours. Thyroid Function Tests: No results for input(s): "TSH", "T4TOTAL", "FREET4", "T3FREE", "THYROIDAB" in the last 72 hours. Anemia Panel: No results for input(s): "VITAMINB12", "FOLATE", "FERRITIN", "TIBC", "IRON", "RETICCTPCT" in the last 72 hours. Sepsis Labs: Recent Labs  Lab 09/25/23 1708  LATICACIDVEN 0.8    Recent Results (from the past 240 hour(s))  Blood Culture (  routine x 2)     Status: None (Preliminary result)   Collection Time: 09/25/23  4:52 PM   Specimen: BLOOD  Result Value Ref Range Status   Specimen Description   Final    BLOOD LEFT ANTECUBITAL Performed at [facility], [address], [place], [place] [address]    Special Requests   Final    BOTTLES DRAWN AEROBIC AND ANAEROBIC Blood Culture adequate volume Performed at [facility], [address], [place], [place] [address]    Culture   Final    NO GROWTH < 12 HOURS Performed at [facility], [address] [place], [place]  [address]    Report Status PENDING  Incomplete  Blood Culture (routine x 2)     Status: None (Preliminary result)   Collection Time: 09/25/23  4:57 PM   Specimen: BLOOD  Result Value Ref Range Status   Specimen Description   Final    BLOOD RIGHT ANTECUBITAL Performed at [facility], [address] [place], [place] [address]    Special Requests   Final    BOTTLES DRAWN AEROBIC AND ANAEROBIC Blood Culture results may not be optimal due to an excessive volume of blood received in culture bottles   Culture   Final    NO GROWTH < 12 HOURS Performed at [facility], [address], [place], [place] [address]    Report Status PENDING  Incomplete  C Difficile Quick Screen w PCR reflex     Status: None   Collection Time: 09/26/23 12:03 AM   Specimen: Urine, Clean Catch; Stool  Result Value Ref Range Status   C Diff antigen NEGATIVE NEGATIVE Final   C Diff toxin NEGATIVE NEGATIVE Final   C Diff interpretation No C. difficile detected.  Final    Comment: Performed at [facility], [address], [place], [place] [address]         Radiology Studies: CT PELVIS W CONTRAST  Result Date: 09/25/2023 CLINICAL DATA:  Perianal abscess or fistula suspected. Pain in left buttock for 1 week. EXAM: CT PELVIS WITH CONTRAST TECHNIQUE: Multidetector CT imaging of the pelvis was performed using the standard protocol following the bolus administration of intravenous contrast. RADIATION DOSE REDUCTION: This exam was performed according to the departmental dose-optimization program which includes automated exposure control, adjustment of the mA and/or kV according to patient size and/or use of iterative reconstruction technique. CONTRAST:  80mL OMNIPAQUE IOHEXOL 300 MG/ML  SOLN COMPARISON:  06/23/2022. FINDINGS: Urinary Tract: Nonobstructive renal calculi are present bilaterally. There is no hydronephrosis. The bladder is unremarkable. Bowel: No bowel obstruction, free air, or  pneumatosis. Rectal wall thickening is noted. There is perianal fat stranding on the left extending into the gluteal region and perineum on the left. No abscess or fistula is seen. There is pelvic floor laxity with inferior displacement of the rectum below the pubococcygeal line Vascular/Lymphatic: Aortic atherosclerosis. No pelvic lymphadenopathy is seen. Reproductive: Coarse calcifications are noted in the uterus, compatible with degenerating fibroids. No adnexal mass is seen. Other:  No ascites. Musculoskeletal: Total hip arthroplasty changes are present on the right. Lumbar spinal fusion hardware is present at L4-L5. There is a compression deformity with augmentation changes at L3. IMPRESSION: 1. Rectal wall thickening with perianal fat stranding extending into the gluteal region and perineum on the left. No abscess or fistula is seen. 2. Findings compatible with pelvic floor dysfunction. 3. Bilateral nephrolithiasis. 4. Uterine fibroids. 5. Aortic atherosclerosis. Electronically  Signed   By: Thornell Sartorius M.D.   On: 09/25/2023 20:23        Scheduled Meds:  aspirin EC  81 mg Oral Daily   colestipol  1 g Oral BID   enoxaparin (LOVENOX) injection  30 mg Subcutaneous Q24H   sodium chloride flush  3 mL Intravenous Q12H   Continuous Infusions:  sodium chloride 250 mL (09/26/23 0024)   piperacillin-tazobactam (ZOSYN)  IV 3.375 g (09/26/23 0842)   vancomycin       LOS: 0 days    Time spent: 56 minutes spent on chart review, discussion with nursing staff, consultants, updating family and interview/physical exam; more than 50% of that time was spent in counseling and/or coordination of care.    Alvira Philips [place], DO [facility] Available via Epic secure chat 7am-7pm After these hours, please refer to coverage provider listed on [email or web address] 09/26/2023, 1:47 PM

## 2023-09-26 NOTE — Progress Notes (Signed)
Patient arrived to unit, alert and oriented, Stand by assist. vss, afebrile, no signs of distress, right lower buttock cellulitis with abscess, scant/trace amount bloody drainage. Enteric prec intiated, r/o c diff. Eductated on isolation prec.  Oriented to room and environment,safety/fall prec,instructed to call for assistance. Call light/phone within reach. Bed alarm on.

## 2023-09-26 NOTE — Consult Note (Addendum)
Consultation  Referring Provider:  [facility]  Primary Care Physician:  Lucky Cowboy, MD Primary Gastroenterologist:  [facility] Reason for Consultation:   Diarrhea  LOS: 0 days          HPI:   Debbie Houston is a 82 y.o. female with past medical history significant for hypertension, GERD, hyperlipidemia, indeterminate colitis (microscopic colitis), presents for evaluation of worsening diarrhea.  Patient initially presented to the ED for fever and 1 week worsening cellulitis and pain over her left buttocks.  She was treated with an outpatient antifungal that was topical per PCP with no improvement.  She notes over the last few weeks she has had increasing diarrhea.  Her baseline is typically 3 bowel movements per days.  She does report having around 6 watery bowel movements per day mainly at night.  She reports being on vancomycin for the last few weeks (C diff negative but previous improvement with her microscopic colitis on C diff per [facility]).    She was on Stelara per her [facility] and was doing well up until few weeks ago when it stopped working for her.  Her last dose was 3 weeks ago.  This week she did 2 days of Velsipity but then her primary GI told her to stop that medication as well.  She does remain on vancomycin outpatient.  Denies melena/hematochezia.  Does have pain around her buttock.  Also has lower abdominal cramping pain.  For her diarrhea control she is on Asacol 2 tabs twice daily, colestipol 2 tabs twice daily, prednisone 10 Mg daily (this was stopped 9/30), vancomycin, and Imodium daily as well.  Prior to seeing [facility] she saw Dr. Ewing Schlein with [facility] (unable to see these records).  CT pelvis with contrast shows rectal wall thickening with perianal fat stranding extending into the gluteal region and perineum on the left.  No abscess or fistula.  Pelvic floor laxity with inferior displacement of the rectum below the pubococcygeal line CRP 15.1, sed rate 54 GI pathogen panel, C.  difficile, fecal calprotectin need to be collected Leukocytosis with WBC 12.8 (improving from 16)  Brother died this year from rectal cancer, son with Crohn's.  PREVIOUS GI WORKUP   Endoscopy and colonoscopy 09/2021, right sided colonic biopsies with chronic active colitis., crypt changes throughout, consistent with indeterminate colitis. Endoscopy did not have small bowel biopsies. Lack of response to entyvio, elevated fecal calpro at 387, changed to remicade monotherapy, infectious complications with remicade (urosepsis), this was stopped. She was started on mesalamine but unable to maintain on this alone, so was started on Stelara 12/2022. This was in conjunction with Vancomycin for C Diff infection. Colonoscopy 01/2023 endoscopically normal appearance, biopsies c/w collagenous colitis.   Previous medications include Entyvio, Remicade, mesalamine, Stelara, 2 days of Velsipity most recently, Vancomycin, prednisone  Past Medical History:  Diagnosis Date   Arthritis    Benign labile hypertension    Cancer (HCC)    skin cancer on face   Colitis    DJD (degenerative joint disease)    Endometrial polyp    GERD (gastroesophageal reflux disease)    History of basal cell carcinoma excision    History of esophagitis    HOH (hard of hearing)    right ear better than left  -- wears no aides   Hyperlipidemia    Hypothyroidism    Neuromuscular disorder (HCC)    neuropathy in feet   Osteopenia    Prediabetes  S/P right hip fracture 08/17/2020   Sepsis (HCC) 2023   per patient   Vitamin D deficiency    Wears glasses     Surgical History:  She  has a past surgical history that includes Mohs surgery (2005); Tonsillectomy (age 2); Dilation and curettage of uterus; Dilatation & curettage/hysteroscopy with myosure (N/A, 10/27/2014); Back surgery (2020); Hip surgery (Right, 04/2020); Cataract extraction, bilateral (Bilateral, 2020); and Conversion to total hip (Right, 01/22/2022). Family History:   Her family history includes Autism in her son; Autism spectrum disorder in her son; Cancer in her father, maternal grandmother, mother, and sister; Colitis in her son; Colon cancer (age of onset: 45) in her brother; Diabetes in her brother and maternal grandfather; Healthy in her son; Hyperlipidemia in her brother, father, maternal grandfather, maternal grandmother, mother, paternal grandfather, paternal grandmother, and sister; Hypertension in her brother and mother; Stroke in her maternal grandfather. Social History:   reports that she quit smoking about 22 years ago. Her smoking use included cigarettes. She started smoking about 37 years ago. She has a 3.8 pack-year smoking history. She has never been exposed to tobacco smoke. She has never used smokeless tobacco. She reports that she does not currently use alcohol. She reports that she does not use drugs.  Prior to Admission medications   Medication Sig Start Date End Date Taking? Authorizing Provider  acetaminophen (TYLENOL) 500 MG tablet Take 500 mg by mouth at bedtime as needed for moderate pain or headache.     [provider]  Ascorbic Acid (VITAMIN C) 1000 MG tablet Take 1,000 mg by mouth daily.     [provider]  aspirin EC 81 MG tablet Take 1 tablet (81 mg total) by mouth in the morning and at bedtime. Patient taking differently: Take 81 mg by mouth daily. 01/23/22   Joen Laura, MD  bisoprolol (ZEBETA) 5 MG tablet Take 1 tablet every Morning for BP 12/25/22   Lucky Cowboy, MD  Calcium Carbonate-Vit D-Min (CALCIUM 1200 PO) Take 1,200 mg by mouth daily.    [provider]  cetirizine (ZYRTEC ALLERGY) 10 MG tablet Take 10 mg by mouth daily.    [provider]  CINNAMON PO Take 2,000 mg by mouth daily.    [provider]  clotrimazole-betamethasone (LOTRISONE) cream Apply 1 Application topically 2 (two) times daily. 09/23/23   Raynelle Dick, NP  Coenzyme Q10 (COQ10) 200 MG CAPS Take  200 mg by mouth daily.    [provider]  COLESTIPOL HCL PO Take by mouth. 2 tabs BID    [provider]  cycloSPORINE (RESTASIS) 0.05 % ophthalmic emulsion Place 1 drop into both eyes 2 (two) times daily.    [provider]  Etrasimod Arginine (VELSIPITY) 2 MG TABS Take 2 mg by mouth daily. 09/16/23   Adela Glimpse, NP  Fluticasone Propionate (FLONASE NA) Place into the nose.    [provider]  levothyroxine (SYNTHROID) 125 MCG tablet TAKE 1 TABLET BY MOUTH DAILY ON AN EMPTY STOMACH WITH ONLY WATER FOR 30 MINUTES(NO ANTACIDS MEDICATIONS, CALCIUM OR MAGNESIUM FOR 4 HOURS) 12/18/21   Raynelle Dick, NP  loperamide (IMODIUM) 2 MG capsule Take 4 mg by mouth as needed. 06/04/22   [provider]  loratadine (CLARITIN) 10 MG tablet Take 10 mg by mouth daily as needed for allergies.    [provider]  meclizine (ANTIVERT) 25 MG tablet 1/2-1 pill up to 3 times daily for motion sickness/dizziness 08/12/23   Aundria Rud,  Rosalva Ferron, NP  Mesalamine (ASACOL PO) Take by mouth. Takes 2 tablets, BID    [provider]  Multiple Vitamin (THERA VITAMIN PO) Take 1 tablet by mouth daily.    [provider]  Omega-3 Fatty Acids (FISH OIL) 1200 MG CAPS Take 1,200 mg by mouth daily.     [provider]  pantoprazole (PROTONIX) 40 MG tablet Take 1 tablet  2 x /day  for Heartburn & Indigestion 03/08/20   Lucky Cowboy, MD  Probiotic Product (PROBIOTIC PO) Take 1 capsule by mouth daily.    [provider]  PROLIA 60 MG/ML SOSY injection Inject 60 mg into the skin every 6 (six) months. 01/28/20   [provider]  pseudoephedrine (SUDAFED) 120 MG 12 hr tablet Take  1 tablet  2 x /day (every 12 hours)  for Head and Chest Congestion 11/15/21   Lucky Cowboy, MD  simvastatin (ZOCOR) 20 MG tablet Take 1 tablet at Bedtime for Cholesterol 02/26/23   Adela Glimpse, NP  Triamcinolone Acetonide (NASACORT ALLERGY 24HR NA) Place into  the nose.    [provider]  triamcinolone ointment (KENALOG) 0.5 % Apply 1 Application topically 2 (two) times daily. 09/05/23   Raynelle Dick, NP  vancomycin (VANCOCIN) 125 MG capsule Take by mouth.    [provider]    Current Facility-Administered Medications  Medication Dose Route Frequency Provider Last Rate Last Admin   0.9 %  sodium chloride infusion  250 mL Intravenous PRN Janalyn Shy, Subrina, MD 10 mL/hr at 09/26/23 0024 250 mL at 09/26/23 0024   acetaminophen (TYLENOL) tablet 650 mg  650 mg Oral Q6H PRN Janalyn Shy, Subrina, MD       Or   acetaminophen (TYLENOL) suppository 650 mg  650 mg Rectal Q6H PRN Janalyn Shy, Subrina, MD       aspirin EC tablet 81 mg  81 mg Oral Daily Sundil, Subrina, MD   81 mg at 09/26/23 0843   bisacodyl (DULCOLAX) EC tablet 5 mg  5 mg Oral Daily PRN Janalyn Shy, Subrina, MD       colestipol (COLESTID) tablet 1 g  1 g Oral BID Janalyn Shy, Subrina, MD   1 g at 09/26/23 0843   enoxaparin (LOVENOX) injection 30 mg  30 mg Subcutaneous Q24H Sundil, Subrina, MD   30 mg at 09/26/23 0844   hydrALAZINE (APRESOLINE) injection 5 mg  5 mg Intravenous Q6H PRN Janalyn Shy, Subrina, MD       ondansetron [facility]) tablet 4 mg  4 mg Oral Q6H PRN Janalyn Shy, Subrina, MD       Or   ondansetron [facility]) injection 4 mg  4 mg Intravenous Q6H PRN Janalyn Shy, Subrina, MD       piperacillin-tazobactam (ZOSYN) IVPB 3.375 g  3.375 g Intravenous Q8H Bryk, Veronda P, RPH 12.5 mL/hr at 09/26/23 0842 3.375 g at 09/26/23 0842   sodium chloride flush (NS) 0.9 % injection 3 mL  3 mL Intravenous Q12H Sundil, Subrina, MD       sodium chloride flush (NS) 0.9 % injection 3 mL  3 mL Intravenous PRN Janalyn Shy, Subrina, MD       vancomycin (VANCOREADY) IVPB 750 mg/150 mL  750 mg Intravenous Q24H Juliette Mangle, RPH        Allergies as of 09/25/2023 - Review Complete 09/25/2023  Allergen Reaction Noted   Remicade [infliximab]  07/02/2022   Fosamax [alendronate sodium] Other (See Comments) 09/01/2013    Singulair [montelukast sodium] Other (See Comments) 09/01/2013   Wellbutrin [bupropion] Hives  09/01/2013   Clindamycin/lincomycin Rash 10/09/2021   Sulfa antibiotics Rash 09/01/2013    Review of Systems  Constitutional:  Positive for chills and fever. Negative for weight loss.  HENT:  Negative for hearing loss and tinnitus.   Eyes:  Negative for blurred vision and double vision.  Respiratory:  Negative for cough and hemoptysis.   Cardiovascular:  Negative for chest pain and palpitations.  Gastrointestinal:  Positive for abdominal pain and diarrhea. Negative for blood in stool, constipation, heartburn, melena, nausea and vomiting.  Genitourinary:  Negative for dysuria and urgency.  Musculoskeletal:  Negative for myalgias and neck pain.  Skin:  Negative for itching and rash.  Neurological:  Negative for seizures and loss of consciousness.  Psychiatric/Behavioral:  Negative for depression and suicidal ideas.        Physical Exam:  Vital signs in last 24 hours: Temp:  [98.3 F (36.8 C)-98.7 F (37.1 C)] 98.3 F (36.8 C) (11/07 0738) Pulse Rate:  [73-92] 73 (11/07 0738) Resp:  [16-19] 18 (11/07 0738) BP: (110-140)/(60-79) 110/60 (11/07 0738) SpO2:  [91 %-97 %] 96 % (11/07 0500) Weight:  [49.2 kg] 49.2 kg (11/07 0738) Last BM Date : 09/26/23 Last BM recorded by nurses in past 5 days Stool Type: Type 6 (Mushy consistency with ragged edges) (09/26/2023  8:52 AM)  Physical Exam Constitutional:      Appearance: Normal appearance.  HENT:     Head: Normocephalic and atraumatic.     Nose: Nose normal. No congestion.  Eyes:     General: No scleral icterus.    Extraocular Movements: Extraocular movements intact.  Cardiovascular:     Rate and Rhythm: Normal rate.  Pulmonary:     Effort: Pulmonary effort is normal. No respiratory distress.  Abdominal:     General: Bowel sounds are normal. There is no distension.     Palpations: Abdomen is soft. There is no mass.     Tenderness: There  is no abdominal tenderness. There is no guarding or rebound.     Hernia: No hernia is present.  Genitourinary:    Comments: Left buttocks with 3 inch x 1 inch area of induration and erythema with punctate head with blood and white purulent material, slight fluctuation, very tender with erythema extending into perineum.  Attempted internal exam with decreased rectal tone patient very tender.  Please see picture attached for detail. Musculoskeletal:        General: No swelling. Normal range of motion.     Cervical back: Normal range of motion and neck supple.  Skin:    General: Skin is warm and dry.  Neurological:     General: No focal deficit present.     Mental Status: She is alert and oriented to person, place, and time.  Psychiatric:        Mood and Affect: Mood normal.        Behavior: Behavior normal.        Thought Content: Thought content normal.        Judgment: Judgment normal.      LAB RESULTS: Recent Labs    09/25/23 1708 09/26/23 0908  WBC 16.0* 12.8*  HGB 11.3* 10.8*  HCT 33.6* 32.9*  PLT 188 194   BMET Recent Labs    09/25/23 1708 09/26/23 0908  NA 132* 135  K 4.0 3.5  CL 99 101  CO2 25 23  GLUCOSE 120* 102*  BUN 10 8  CREATININE 0.79 0.79  CALCIUM 9.6 8.9   LFT Recent Labs  09/26/23 0908  PROT 5.9*  ALBUMIN 3.1*  AST 17  ALT 15  ALKPHOS 47  BILITOT 0.6   PT/INR No results for input(s): "LABPROT", "INR" in the last 72 hours.  STUDIES: CT PELVIS W CONTRAST  Result Date: 09/25/2023 CLINICAL DATA:  Perianal abscess or fistula suspected. Pain in left buttock for 1 week. EXAM: CT PELVIS WITH CONTRAST TECHNIQUE: Multidetector CT imaging of the pelvis was performed using the standard protocol following the bolus administration of intravenous contrast. RADIATION DOSE REDUCTION: This exam was performed according to the departmental dose-optimization program which includes automated exposure control, adjustment of the mA and/or kV according to patient  size and/or use of iterative reconstruction technique. CONTRAST:  80mL OMNIPAQUE IOHEXOL 300 MG/ML  SOLN COMPARISON:  06/23/2022. FINDINGS: Urinary Tract: Nonobstructive renal calculi are present bilaterally. There is no hydronephrosis. The bladder is unremarkable. Bowel: No bowel obstruction, free air, or pneumatosis. Rectal wall thickening is noted. There is perianal fat stranding on the left extending into the gluteal region and perineum on the left. No abscess or fistula is seen. There is pelvic floor laxity with inferior displacement of the rectum below the pubococcygeal line Vascular/Lymphatic: Aortic atherosclerosis. No pelvic lymphadenopathy is seen. Reproductive: Coarse calcifications are noted in the uterus, compatible with degenerating fibroids. No adnexal mass is seen. Other:  No ascites. Musculoskeletal: Total hip arthroplasty changes are present on the right. Lumbar spinal fusion hardware is present at L4-L5. There is a compression deformity with augmentation changes at L3. IMPRESSION: 1. Rectal wall thickening with perianal fat stranding extending into the gluteal region and perineum on the left. No abscess or fistula is seen. 2. Findings compatible with pelvic floor dysfunction. 3. Bilateral nephrolithiasis. 4. Uterine fibroids. 5. Aortic atherosclerosis. Electronically Signed   By: Thornell Sartorius M.D.   On: 09/25/2023 20:23      Impression    History of collagenous colitis Colonoscopy 01/2023 endoscopically normal appearance, biopsies c/w collagenous colitis.  Previous medications include Entyvio, Remicade (urosepsis), mesalamine, Stelara, 2 days of Velsipity most recently, Vancomycin, prednisone. She remains on vancomycin and took first dose of Velsipity 11/4 and 11/5 CRP 15.1, sed rate 54 GI pathogen panel, C. difficile, fecal calprotectin need to be collected Leukocytosis with WBC 12.8 (improving from 16) -Await stool studies -Pending stool studies may consider flexible sigmoidoscopy  with biopsies  Cellulitis and Abscess of left buttock See physical exam CT pelvis with contrast shows rectal wall thickening with perianal fat stranding extending into the gluteal region and perineum on the left.  No abscess or fistula.  Pelvic floor laxity with inferior displacement of the rectum below the pubococcygeal line - Consider MRI of pelvis - Consult surgery, consider exam under anesthesia - Continue broad-spectrum antibiotics  Thank you for your kind consultation, we will continue to follow.   Bayley Leanna Sato  09/26/2023, 11:11 AM    Attending physician's note  I have taken a history, reviewed the chart and examined the patient. I performed a substantive portion of this encounter, including complete performance of at least one of the key components, in conjunction with the APP. I agree with the APP's note, impression and recommendations.    82 year old female with chronic diarrhea, indeterminant colitis, most recent colonoscopy with biopsies consistent with collagenous colitis.  She has been on multiple Biologics currently on Asacol and vancomycin  She has a  perianal fistula, CT with perianal fat stranding and rectal wall thickening Will obtain MRI pelvis to better characterize it.  Schedule for flexible sigmoidoscopy  with biopsies, need to exclude CMV colitis  C. difficile negative, await GI pathogen panel and fecal calprotectin Hold Asacol and vancomycin for now  Dr. Ewing Schlein is planning to take over care of patient tomorrow, she was previously followed by his group.  The patient was provided an opportunity to ask questions and all were answered. The patient agreed with the plan and demonstrated an understanding of the instructions.  Iona Beard , MD [phone]

## 2023-09-26 NOTE — Plan of Care (Signed)
  Problem: Education: Goal: Knowledge of General Education information will improve Description: Including pain rating scale, medication(s)/side effects and non-pharmacologic comfort measures Outcome: Progressing   Problem: Health Behavior/Discharge Planning: Goal: Ability to manage health-related needs will improve Outcome: Progressing   Problem: Nutrition: Goal: Adequate nutrition will be maintained Outcome: Progressing   Problem: Elimination: Goal: Will not experience complications related to urinary retention Outcome: Progressing   

## 2023-09-26 NOTE — Consult Note (Signed)
Consult Note  Debbie Houston 12-23-1940  [id number].    Requesting MD: Louie Casa Chief Complaint/Reason for Consult: gluteal cellulitis, possible abscess  HPI:  Patient is an 82 year old female with PMH HTN, HLD, hypothyroidism, GERD, and microscopic colitis who presented to the ED 11/6 with concern for gluteal abscess. She reported 1 week history of worsening cellulitis and pain of left buttock. She saw her PCP and tried topical antibiotic ointment but no improvement. She is on Stelara for her colitis.  She denies every having fistulas or abscesses in the past.  She is also having suprapubic abdominal pain with diarrhea as well.  Her brother had rectal cancer and passed aware this year and her son has crohn's disease.  She presented to [facility] where she underwent a CT that revealed proctitis, but no concern for abscess.  On exam she was noted to have cellulitis of her left buttock and a wound.  She states that she has had some bloody drainage for the last couple of days.  We have been consulted to evaluate for further recommendations.  ROS: ROS: see HPI  Family History  Problem Relation Age of Onset   Hypertension Mother    Cancer Mother        COLON   Hyperlipidemia Mother    Cancer Father        BREAST WITH BRAIN METS   Hyperlipidemia Father    Cancer Sister        COLON (TEENS), BREAST (50)   Hyperlipidemia Sister    Diabetes Brother    Hyperlipidemia Brother    Hypertension Brother    Colon cancer Brother 5   Cancer Maternal Grandmother        RENAL   Hyperlipidemia Maternal Grandmother    Diabetes Maternal Grandfather    Stroke Maternal Grandfather    Hyperlipidemia Maternal Grandfather    Hyperlipidemia Paternal Grandmother    Hyperlipidemia Paternal Grandfather    Autism Son    Colitis Son    Autism spectrum disorder Son    Healthy Son     Past Medical History:  Diagnosis Date   Arthritis    Benign labile hypertension    Cancer (HCC)    skin  cancer on face   Colitis    DJD (degenerative joint disease)    Endometrial polyp    GERD (gastroesophageal reflux disease)    History of basal cell carcinoma excision    History of esophagitis    HOH (hard of hearing)    right ear better than left  -- wears no aides   Hyperlipidemia    Hypothyroidism    Neuromuscular disorder (HCC)    neuropathy in feet   Osteopenia    Prediabetes    S/P right hip fracture 08/17/2020   Sepsis (HCC) 2023   per patient   Vitamin D deficiency    Wears glasses     Past Surgical History:  Procedure Laterality Date   BACK SURGERY  2020   CATARACT EXTRACTION, BILATERAL Bilateral 2020   CONVERSION TO TOTAL HIP Right 01/22/2022   Procedure: CONVERSION TO TOTAL HIP;  Surgeon: Joen Laura, MD;  Location: [facility];  Service: Orthopedics;  Laterality: Right;   DILATATION & CURETTAGE/HYSTEROSCOPY WITH MYOSURE N/A 10/27/2014   Procedure: DILATATION & CURETTAGE/HYSTEROSCOPY WITH MYOSURE;  Surgeon: Juluis Mire, MD;  Location: [facility];  Service: Gynecology;  Laterality: N/A;   DILATION AND CURETTAGE OF UTERUS  HIP SURGERY Right 04/2020   rod and screw in right hip    MOHS SURGERY  2005   LEFT NASAL BRIDGE FOR BASAL CELL   TONSILLECTOMY  age 27    Social History:  reports that she quit smoking about 22 years ago. Her smoking use included cigarettes. She started smoking about 37 years ago. She has a 3.8 pack-year smoking history. She has never been exposed to tobacco smoke. She has never used smokeless tobacco. She reports that she does not currently use alcohol. She reports that she does not use drugs.  Allergies:  Allergies  Allergen Reactions   Remicade [Infliximab]    Fosamax [Alendronate Sodium] Other (See Comments)    GI upset    Singulair [Montelukast Sodium] Other (See Comments)    Makes pt jittery   Wellbutrin [Bupropion] Hives   Clindamycin/Lincomycin Rash   Sulfa Antibiotics Rash    Medications Prior to  Admission  Medication Sig Dispense Refill   acetaminophen (TYLENOL) 500 MG tablet Take 500 mg by mouth at bedtime as needed for moderate pain or headache.      Ascorbic Acid (VITAMIN C) 1000 MG tablet Take 1,000 mg by mouth daily.      aspirin EC 81 MG tablet Take 1 tablet (81 mg total) by mouth in the morning and at bedtime. (Patient taking differently: Take 81 mg by mouth daily.) 60 tablet 0   bisoprolol (ZEBETA) 5 MG tablet Take 1 tablet every Morning for BP 90 tablet 3   Calcium Carbonate-Vit D-Min (CALCIUM 1200 PO) Take 1,200 mg by mouth daily.     cetirizine (ZYRTEC ALLERGY) 10 MG tablet Take 10 mg by mouth daily.     CINNAMON PO Take 2,000 mg by mouth daily.     clotrimazole-betamethasone (LOTRISONE) cream Apply 1 Application topically 2 (two) times daily. 15 g 2   Coenzyme Q10 (COQ10) 200 MG CAPS Take 200 mg by mouth daily.     COLESTIPOL HCL PO Take by mouth. 2 tabs BID     cycloSPORINE (RESTASIS) 0.05 % ophthalmic emulsion Place 1 drop into both eyes 2 (two) times daily.     Etrasimod Arginine (VELSIPITY) 2 MG TABS Take 2 mg by mouth daily.     Fluticasone Propionate (FLONASE NA) Place into the nose.     levothyroxine (SYNTHROID) 125 MCG tablet TAKE 1 TABLET BY MOUTH DAILY ON AN EMPTY STOMACH WITH ONLY WATER FOR 30 MINUTES(NO ANTACIDS MEDICATIONS, CALCIUM OR MAGNESIUM FOR 4 HOURS) 90 tablet 0   loperamide (IMODIUM) 2 MG capsule Take 4 mg by mouth as needed.     loratadine (CLARITIN) 10 MG tablet Take 10 mg by mouth daily as needed for allergies.     meclizine (ANTIVERT) 25 MG tablet 1/2-1 pill up to 3 times daily for motion sickness/dizziness 30 tablet 0   Mesalamine (ASACOL PO) Take by mouth. Takes 2 tablets, BID     Multiple Vitamin (THERA VITAMIN PO) Take 1 tablet by mouth daily.     Omega-3 Fatty Acids (FISH OIL) 1200 MG CAPS Take 1,200 mg by mouth daily.      pantoprazole (PROTONIX) 40 MG tablet Take 1 tablet  2 x /day  for Heartburn & Indigestion 180 tablet 0   Probiotic  Product (PROBIOTIC PO) Take 1 capsule by mouth daily.     PROLIA 60 MG/ML SOSY injection Inject 60 mg into the skin every 6 (six) months.     pseudoephedrine (SUDAFED) 120 MG 12 hr tablet Take  1 tablet  2 x /day (every 12 hours)  for Head and Chest Congestion 60 tablet 0   simvastatin (ZOCOR) 20 MG tablet Take 1 tablet at Bedtime for Cholesterol 90 tablet 3   Triamcinolone Acetonide (NASACORT ALLERGY 24HR NA) Place into the nose.     triamcinolone ointment (KENALOG) 0.5 % Apply 1 Application topically 2 (two) times daily. 30 g 0   vancomycin (VANCOCIN) 125 MG capsule Take by mouth.      Blood pressure 110/60, pulse 73, temperature 98.3 F (36.8 C), temperature source Oral, resp. rate 18, height 5' 3.5" (1.613 m), weight 49.2 kg, SpO2 96%. Physical Exam:  General: pleasant, WD, WN female who is laying in bed in NAD HEENT: head is normocephalic, atraumatic.  Sclera are noninjected.  PERRL.  Ears and nose without any masses or lesions.  Mouth is pink and moist Heart: regular, rate, and rhythm.   Lungs:  Respiratory effort nonlabored Abd: soft, mild suprapubic tenderness, ND, +BS, no masses, hernias, or organomegaly Buttock/skin: left gluteus with cellulitis and some induration.  Cental area of small 1x1cm wound.  No purulent drainage noted at this time.  No fluctuance noted.  Some erythema tracking towards her perineum, but this is nontender. Psych: A&Ox3 with an appropriate affect.   Results for orders placed or performed during the hospital encounter of 09/25/23 (from the past 48 hour(s))  Blood Culture (routine x 2)     Status: None (Preliminary result)   Collection Time: 09/25/23  4:52 PM   Specimen: BLOOD  Result Value Ref Range   Specimen Description      BLOOD LEFT ANTECUBITAL Performed at [facility], [address] [place], [place] [address]    Special Requests      BOTTLES DRAWN AEROBIC AND ANAEROBIC Blood Culture adequate volume Performed at [facility], [address], [place], [place] [address]    Culture      NO GROWTH < 12 HOURS Performed at [facility], [address], [place], [place] [address]    Report Status PENDING   Blood Culture (routine x 2)     Status: None (Preliminary result)   Collection Time: 09/25/23  4:57 PM   Specimen: BLOOD  Result Value Ref Range   Specimen Description      BLOOD RIGHT ANTECUBITAL Performed at [facility], [address], [place], [place] [address]    Special Requests      BOTTLES DRAWN AEROBIC AND ANAEROBIC Blood Culture results may not be optimal due to an excessive volume of blood received in culture bottles   Culture      NO GROWTH < 12 HOURS Performed at [facility], [address], [place], [place] [address]    Report Status PENDING   Lactic acid, plasma     Status: None   Collection Time: 09/25/23  5:08 PM  Result Value Ref Range   Lactic Acid, Venous 0.8 0.5 - 1.9 mmol/L    Comment: Performed at [facility], [address] [place], [place] [address]  Comprehensive metabolic panel     Status: Abnormal   Collection Time: 09/25/23  5:08 PM  Result Value Ref Range   Sodium 132 (L) 135 - 145 mmol/L   Potassium 4.0 3.5 - 5.1 mmol/L   Chloride 99 98 - 111 mmol/L   CO2 25 22 - 32 mmol/L   Glucose, Bld 120 (H) 70 - 99 mg/dL    Comment: Glucose reference range applies only to  samples taken after fasting for at least 8 hours.   BUN 10 8 - 23 mg/dL   Creatinine, Ser 1.61 0.44 - 1.00 mg/dL   Calcium 9.6 8.9 - 09.6 mg/dL   Total Protein 6.4 (L) 6.5 - 8.1 g/dL   Albumin 3.9 3.5 - 5.0 g/dL   AST 17 15 - 41 U/L   ALT 14 0 - 44 U/L   Alkaline Phosphatase 46 38 - 126 U/L   Total Bilirubin 0.8 <1.2 mg/dL   GFR, Estimated >04 >54 mL/min    Comment: (NOTE) Calculated using the CKD-EPI Creatinine Equation (2021)    Anion gap 8 5 - 15    Comment: Performed at [facility], [address] [place], [place] [address]  CBC with Differential     Status: Abnormal   Collection Time: 09/25/23  5:08 PM  Result Value Ref Range   WBC 16.0 (H) 4.0 - 10.5 K/uL   RBC 3.45 (L) 3.87 - 5.11 MIL/uL   Hemoglobin 11.3 (L) 12.0 - 15.0 g/dL   HCT 91.4 (L) 78.2 - 95.6 %   MCV 97.4 80.0 - 100.0 fL   MCH 32.8 26.0 - 34.0 pg   MCHC 33.6 30.0 - 36.0 g/dL   RDW 21.3 08.6 - 57.8 %   Platelets 188 150 - 400 K/uL   nRBC 0.0 0.0 - 0.2 %   Neutrophils Relative % 88 %   Neutro Abs 14.0 (H) 1.7 - 7.7 K/uL   Lymphocytes Relative 4 %   Lymphs Abs 0.6 (L) 0.7 - 4.0 K/uL   Monocytes Relative 7 %   Monocytes Absolute 1.2 (H) 0.1 - 1.0 K/uL   Eosinophils Relative 0 %   Eosinophils Absolute 0.0 0.0 - 0.5 K/uL   Basophils Relative 0 %   Basophils Absolute 0.0 0.0 - 0.1 K/uL   Immature Granulocytes 1 %   Abs Immature Granulocytes 0.17 (H) 0.00 - 0.07 K/uL    Comment: Performed at [facility], [address], [place], [place] [address]  C Difficile Quick Screen w PCR reflex     Status: None   Collection Time: 09/26/23 12:03 AM   Specimen: Urine, Clean Catch; Stool  Result Value Ref Range   C Diff antigen NEGATIVE NEGATIVE   C Diff toxin NEGATIVE NEGATIVE   C Diff interpretation No C. difficile detected.     Comment: Performed at [facility], [address] [place], [place] [address]  CBC     Status: Abnormal   Collection Time: 09/26/23  9:08 AM  Result Value Ref Range   WBC 12.8 (H) 4.0 - 10.5 K/uL   RBC 3.36 (L) 3.87 - 5.11 MIL/uL   Hemoglobin 10.8 (L) 12.0 - 15.0 g/dL   HCT 13.2 (L) 44.0 - 10.2 %   MCV 97.9 80.0 - 100.0 fL   MCH 32.1 26.0 - 34.0 pg   MCHC 32.8 30.0 - 36.0 g/dL   RDW 72.5 36.6 - 44.0 %   Platelets 194 150 - 400 K/uL   nRBC 0.0 0.0 - 0.2 %    Comment: Performed at [facility], [address] [place], [place] [address]  Comprehensive metabolic panel     Status: Abnormal   Collection Time: 09/26/23  9:08 AM  Result Value Ref Range    Sodium 135 135 - 145 mmol/L   Potassium 3.5 3.5 - 5.1 mmol/L   Chloride 101 98 - 111 mmol/L   CO2 23 22 - 32 mmol/L  Glucose, Bld 102 (H) 70 - 99 mg/dL    Comment: Glucose reference range applies only to samples taken after fasting for at least 8 hours.   BUN 8 8 - 23 mg/dL   Creatinine, Ser 7.82 0.44 - 1.00 mg/dL   Calcium 8.9 8.9 - 95.6 mg/dL   Total Protein 5.9 (L) 6.5 - 8.1 g/dL   Albumin 3.1 (L) 3.5 - 5.0 g/dL   AST 17 15 - 41 U/L   ALT 15 0 - 44 U/L   Alkaline Phosphatase 47 38 - 126 U/L   Total Bilirubin 0.6 <1.2 mg/dL   GFR, Estimated >21 >30 mL/min    Comment: (NOTE) Calculated using the CKD-EPI Creatinine Equation (2021)    Anion gap 11 5 - 15    Comment: Performed at [facility], [address] [place], [place] [address]  C-reactive protein     Status: Abnormal   Collection Time: 09/26/23  9:08 AM  Result Value Ref Range   CRP 15.1 (H) <1.0 mg/dL    Comment: Performed at [facility], [address], [place], [place] [address]  Sedimentation rate     Status: Abnormal   Collection Time: 09/26/23  9:08 AM  Result Value Ref Range   Sed Rate 54 (H) 0 - 22 mm/hr    Comment: Performed at [facility], [address] [place], [place] [address]  Osmolality     Status: None   Collection Time: 09/26/23  9:08 AM  Result Value Ref Range   Osmolality 281 275 - 295 mOsm/kg    Comment: Performed at [facility], [address] [place], [place] [address]  Osmolality, urine     Status: Abnormal   Collection Time: 09/26/23 10:30 AM  Result Value Ref Range   Osmolality, Ur 288 (L) 300 - 900 mOsm/kg    Comment: Performed at [facility], [address] [place], [place] [address]  Sodium, urine, random     Status: None   Collection Time: 09/26/23 10:30 AM  Result Value Ref Range   Sodium, Ur 31 mmol/L    Comment: Performed at [facility], [address] [place], [place] [address]   CT PELVIS W CONTRAST  Result Date:  09/25/2023 CLINICAL DATA:  Perianal abscess or fistula suspected. Pain in left buttock for 1 week. EXAM: CT PELVIS WITH CONTRAST TECHNIQUE: Multidetector CT imaging of the pelvis was performed using the standard protocol following the bolus administration of intravenous contrast. RADIATION DOSE REDUCTION: This exam was performed according to the departmental dose-optimization program which includes automated exposure control, adjustment of the mA and/or kV according to patient size and/or use of iterative reconstruction technique. CONTRAST:  80mL OMNIPAQUE IOHEXOL 300 MG/ML  SOLN COMPARISON:  06/23/2022. FINDINGS: Urinary Tract: Nonobstructive renal calculi are present bilaterally. There is no hydronephrosis. The bladder is unremarkable. Bowel: No bowel obstruction, free air, or pneumatosis. Rectal wall thickening is noted. There is perianal fat stranding on the left extending into the gluteal region and perineum on the left. No abscess or fistula is seen. There is pelvic floor laxity with inferior displacement of the rectum below the pubococcygeal line Vascular/Lymphatic: Aortic atherosclerosis. No pelvic lymphadenopathy is seen. Reproductive: Coarse calcifications are noted in the uterus, compatible with degenerating fibroids. No adnexal mass is seen. Other:  No ascites. Musculoskeletal: Total hip arthroplasty changes are present on the right. Lumbar spinal fusion hardware is present at L4-L5. There is a compression deformity  with augmentation changes at L3. IMPRESSION: 1. Rectal wall thickening with perianal fat stranding extending into the gluteal region and perineum on the left. No abscess or fistula is seen. 2. Findings compatible with pelvic floor dysfunction. 3. Bilateral nephrolithiasis. 4. Uterine fibroids. 5. Aortic atherosclerosis. Electronically Signed   By: Thornell Sartorius M.D.   On: 09/25/2023 20:23      Assessment/Plan Cellulitis/abscess of left buttock The patient has been seen, examined, labs,  vitals, chart, and imaging personally reviewed.  She appears to have cellulitis of her left buttock with a central wound.  This appears to have spontaneously "opened" and if any infection was present would drain from this location.  Currently there is no purulent drainage noted.  Most of this area appears cellulitic around this site with no fluctuance for further concern for undrained collection.  We would recommend continued IV abx therapy for this cellulitis.  We will continue to monitor to assure this doesn't worsen and need debridement.  FEN - heart healthy diet VTE - lovenox ID - zosyn/vanc  Proctitis with history of colitis - per medicine/GI HLD hypothyroidism  I reviewed Consultant GI notes, hospitalist notes, last 24 h vitals and pain scores, last 48 h intake and output, last 24 h labs and trends, and last 24 h imaging results.  Letha Cape, [facility] Surgery 09/26/2023, 2:12 PM Please see Amion for pager number during day hours 7:00am-4:30pm

## 2023-09-26 NOTE — Progress Notes (Signed)
   09/26/23 1333  Mobility  Activity Ambulated independently in hallway  Level of Assistance Standby assist, set-up cues, supervision of patient - no hands on  Assistive Device Other (Comment) (Iv Pole)  Distance Ambulated (ft) 150 ft  Activity Response Tolerated fair  Mobility Referral Yes  $Mobility charge 1 Mobility  Mobility Specialist Start Time (ACUTE ONLY) 1323  Mobility Specialist Stop Time (ACUTE ONLY) 1333  Mobility Specialist Time Calculation (min) (ACUTE ONLY) 10 min   Mobility Specialist: Progress Note  Pt agreeable to mobility session - received walking in hallway with RN. RN passed off to MS. Required SB using IV Pole. C/o rectum pain and slight lightheadedness with ambulation. Returned to chair with all needs met - call bell within reach.   No chair alarm placed per Nurse consent.    Barnie Mort, BS Mobility Specialist Please contact via SecureChat or Rehab office at [phone].

## 2023-09-26 NOTE — Progress Notes (Signed)
Pharmacy Antibiotic Note  Debbie Houston is a 82 y.o. female admitted on 09/25/2023 with cellulitis/proctitis.  Pharmacy has been consulted for vancomycin and Zosyn dosing.  Plan: Vancomycin 750mg  IV Q24H. Goal AUC 400-550.  Expected AUC 500. Zosyn 3.375g IV Q8H (4-hour infusion).   Temp (24hrs), Avg:98.6 F (37 C), Min:98.5 F (36.9 C), Max:98.7 F (37.1 C)  Recent Labs  Lab 09/23/23 1110 09/25/23 1708  WBC 10.3 16.0*  CREATININE 0.77 0.79  LATICACIDVEN  --  0.8    Estimated Creatinine Clearance: 43.5 mL/min (by C-G formula based on SCr of 0.79 mg/dL).    Allergies  Allergen Reactions   Remicade [Infliximab]    Fosamax [Alendronate Sodium] Other (See Comments)    GI upset    Singulair [Montelukast Sodium] Other (See Comments)    Makes pt jittery   Wellbutrin [Bupropion] Hives   Clindamycin/Lincomycin Rash   Sulfa Antibiotics Rash    Thank you for allowing pharmacy to be a part of this patient's care.  Vernard Gambles, PharmD, BCPS  09/26/2023 12:09 AM

## 2023-09-26 NOTE — Progress Notes (Signed)
   09/26/23 1545  Mobility  Activity Ambulated independently in hallway  Level of Assistance Standby assist, set-up cues, supervision of patient - no hands on  Assistive Device Other (Comment) (IV Pole)  Distance Ambulated (ft) 550 ft  Activity Response Tolerated fair  Mobility Referral Yes  $Mobility charge 1 Mobility  Mobility Specialist Start Time (ACUTE ONLY) 1531  Mobility Specialist Stop Time (ACUTE ONLY) 1545  Mobility Specialist Time Calculation (min) (ACUTE ONLY) 14 min   Mobility Specialist: Progress Note  Pt agreeable to mobility session - received in chair. Required SB using Iv Pole. C/o rectum pain and lightheadedness d/t "not eating."  Returned to chair with all needs met - call bell within reach.   Barnie Mort, BS Mobility Specialist Please contact via SecureChat or Rehab office at [phone].

## 2023-09-27 ENCOUNTER — Encounter (HOSPITAL_COMMUNITY): Payer: Self-pay | Admitting: Internal Medicine

## 2023-09-27 ENCOUNTER — Inpatient Hospital Stay (HOSPITAL_COMMUNITY): Payer: Medicare Other | Admitting: Anesthesiology

## 2023-09-27 ENCOUNTER — Encounter (HOSPITAL_COMMUNITY)
Admission: EM | Disposition: A | Discharge status: Home / Self Care | Payer: Self-pay | Source: Home / Self Care | Attending: Internal Medicine

## 2023-09-27 DIAGNOSIS — K52839 Microscopic colitis, unspecified: Secondary | ICD-10-CM

## 2023-09-27 DIAGNOSIS — K6289 Other specified diseases of anus and rectum: Secondary | ICD-10-CM | POA: Diagnosis not present

## 2023-09-27 HISTORY — PX: FLEXIBLE SIGMOIDOSCOPY: SHX5431

## 2023-09-27 HISTORY — PX: BIOPSY: SHX5522

## 2023-09-27 LAB — GASTROINTESTINAL PANEL BY PCR, STOOL (REPLACES STOOL CULTURE)

## 2023-09-27 LAB — BASIC METABOLIC PANEL
Anion gap: 9 (ref 5–15)
BUN: 7 mg/dL — ABNORMAL LOW (ref 8–23)
CO2: 22 mmol/L (ref 22–32)
Calcium: 8.7 mg/dL — ABNORMAL LOW (ref 8.9–10.3)
Chloride: 106 mmol/L (ref 98–111)
Creatinine, Ser: 0.68 mg/dL (ref 0.44–1.00)
GFR, Estimated: >60 mL/min (ref >60)
Glucose, Bld: 96 mg/dL (ref 70–99)
Potassium: 3.6 mmol/L (ref 3.5–5.1)
Sodium: 137 mmol/L (ref 135–145)

## 2023-09-27 LAB — CBC
HCT: 31.7 % — ABNORMAL LOW (ref 36.0–46.0)
Hemoglobin: 10.3 g/dL — ABNORMAL LOW (ref 12.0–15.0)
MCH: 31.6 pg (ref 26.0–34.0)
MCHC: 32.5 g/dL (ref 30.0–36.0)
MCV: 97.2 fL (ref 80.0–100.0)
Platelets: 195 10*3/uL (ref 150–400)
RBC: 3.26 MIL/uL — ABNORMAL LOW (ref 3.87–5.11)
RDW: 13.3 % (ref 11.5–15.5)
WBC: 9.5 10*3/uL (ref 4.0–10.5)
nRBC: 0 % (ref 0.0–0.2)

## 2023-09-27 LAB — MAGNESIUM: Magnesium: 1.8 mg/dL (ref 1.7–2.4)

## 2023-09-27 SURGERY — SIGMOIDOSCOPY, FLEXIBLE
Anesthesia: Monitor Anesthesia Care

## 2023-09-27 MED ORDER — POTASSIUM CHLORIDE CRYS ER 20 MEQ PO TBCR
40.0000 meq | EXTENDED_RELEASE_TABLET | Freq: Once | ORAL | Status: AC
  Administered 2023-09-27: 40 meq via ORAL
  Filled 2023-09-27: qty 2

## 2023-09-27 MED ORDER — SODIUM CHLORIDE 0.9 % IV SOLN
INTRAVENOUS | Status: DC | PRN
Start: 2023-09-27 — End: 2023-09-27

## 2023-09-27 MED ORDER — COLESTIPOL HCL 1 G PO TABS
1.0000 g | ORAL_TABLET | Freq: Two times a day (BID) | ORAL | Status: DC
  Filled 2023-09-27: qty 1

## 2023-09-27 MED ORDER — PROPOFOL 10 MG/ML IV BOLUS
INTRAVENOUS | Status: DC | PRN
  Administered 2023-09-27: 50 mg via INTRAVENOUS
  Administered 2023-09-27: 50 ug/kg/min via INTRAVENOUS

## 2023-09-27 MED ORDER — PHENYLEPHRINE HCL (PRESSORS) 10 MG/ML IV SOLN
INTRAVENOUS | Status: DC | PRN
  Administered 2023-09-27: 80 ug via INTRAVENOUS

## 2023-09-27 NOTE — Progress Notes (Signed)
Subjective: Feels much better today.    ROS: See above, otherwise other systems negative  Objective: Vital signs in last 24 hours: Temp:  [98.3 F (36.8 C)-98.9 F (37.2 C)] 98.3 F (36.8 C) (11/08 0806) Pulse Rate:  [83-89] 83 (11/08 0806) Resp:  [16-17] 17 (11/08 0806) BP: (116-133)/(73-79) 127/79 (11/08 0806) SpO2:  [96 %-98 %] 97 % (11/08 0806) Weight:  [51.4 kg] 51.4 kg (11/08 0500) Last BM Date : 09/26/23  Intake/Output from previous day: 11/07 0701 - 11/08 0700 In: 357 [P.O.:357] Out: -  Intake/Output this shift: No intake/output data recorded.  PE: Buttock: significantly less erythematous today with less induration.  Wound is clean with no purulent drainage.  No fluctuance or abscess noted.  Lab Results:  Recent Labs    09/26/23 0908 09/27/23 0627  WBC 12.8* 9.5  HGB 10.8* 10.3*  HCT 32.9* 31.7*  PLT 194 195   BMET Recent Labs    09/26/23 0908 09/27/23 0627  NA 135 137  K 3.5 3.6  CL 101 106  CO2 23 22  GLUCOSE 102* 96  BUN 8 7*  CREATININE 0.79 0.68  CALCIUM 8.9 8.7*   PT/INR No results for input(s): "LABPROT", "INR" in the last 72 hours. CMP     Component Value Date/Time   NA 137 09/27/2023 0627   K 3.6 09/27/2023 0627   CL 106 09/27/2023 0627   CO2 22 09/27/2023 0627   GLUCOSE 96 09/27/2023 0627   BUN 7 (L) 09/27/2023 0627   CREATININE 0.68 09/27/2023 0627   CREATININE 0.77 09/23/2023 1110   CALCIUM 8.7 (L) 09/27/2023 0627   PROT 5.9 (L) 09/26/2023 0908   ALBUMIN 3.1 (L) 09/26/2023 0908   AST 17 09/26/2023 0908   ALT 15 09/26/2023 0908   ALKPHOS 47 09/26/2023 0908   BILITOT 0.6 09/26/2023 0908   GFRNONAA >60 09/27/2023 0627   GFRNONAA 75 05/15/2021 1513   GFRAA 86 05/15/2021 1513   Lipase     Component Value Date/Time   LIPASE 83 (H) 12/09/2019 1524       Studies/Results: US PELVIS LIMITED (TRANSABDOMINAL ONLY)  Result Date: 09/26/2023 CLINICAL DATA:  Cellulitis and abscess of the left lower extremity. EXAM:  US PELVIS LIMITED TECHNIQUE: Ultrasound examination of the pelvic soft tissues was performed in the area of clinical concern. COMPARISON:  None Available. FINDINGS: Targeted sonographic images of the soft tissues of the left buttock in the region of the clinical concern performed. There is subcutaneous edema. There is a 1.5 x 0.5 x 2.4 cm area of more localized complex fluid in the subcutaneous soft tissues with surrounding hyperemia. Findings may represent phlegmonous changes or developing abscess. No discrete organized wall at this time. IMPRESSION: Probable cellulitis with a small focal accumulated subcutaneous fluid which may represent phlegmon. No drainable fluid collection at this time. The Electronically Signed   By: Elgie Collard M.D.   On: 09/26/2023 18:57   CT PELVIS W CONTRAST  Result Date: 09/25/2023 CLINICAL DATA:  Perianal abscess or fistula suspected. Pain in left buttock for 1 week. EXAM: CT PELVIS WITH CONTRAST TECHNIQUE: Multidetector CT imaging of the pelvis was performed using the standard protocol following the bolus administration of intravenous contrast. RADIATION DOSE REDUCTION: This exam was performed according to the departmental dose-optimization program which includes automated exposure control, adjustment of the mA and/or kV according to patient size and/or use of iterative reconstruction technique. CONTRAST:  80mL OMNIPAQUE IOHEXOL 300 MG/ML  SOLN COMPARISON:  06/23/2022.  FINDINGS: Urinary Tract: Nonobstructive renal calculi are present bilaterally. There is no hydronephrosis. The bladder is unremarkable. Bowel: No bowel obstruction, free air, or pneumatosis. Rectal wall thickening is noted. There is perianal fat stranding on the left extending into the gluteal region and perineum on the left. No abscess or fistula is seen. There is pelvic floor laxity with inferior displacement of the rectum below the pubococcygeal line Vascular/Lymphatic: Aortic atherosclerosis. No pelvic  lymphadenopathy is seen. Reproductive: Coarse calcifications are noted in the uterus, compatible with degenerating fibroids. No adnexal mass is seen. Other:  No ascites. Musculoskeletal: Total hip arthroplasty changes are present on the right. Lumbar spinal fusion hardware is present at L4-L5. There is a compression deformity with augmentation changes at L3. IMPRESSION: 1. Rectal wall thickening with perianal fat stranding extending into the gluteal region and perineum on the left. No abscess or fistula is seen. 2. Findings compatible with pelvic floor dysfunction. 3. Bilateral nephrolithiasis. 4. Uterine fibroids. 5. Aortic atherosclerosis. Electronically Signed   By: Thornell Sartorius M.D.   On: 09/25/2023 20:23    Anti-infectives: Anti-infectives (From admission, onward)    Start     Dose/Rate Route Frequency Ordered Stop   09/26/23 1800  vancomycin (VANCOREADY) IVPB 750 mg/150 mL        750 mg 150 mL/hr over 60 Minutes Intravenous Every 24 hours 09/26/23 0011     09/26/23 0015  piperacillin-tazobactam (ZOSYN) IVPB 3.375 g        3.375 g 12.5 mL/hr over 240 Minutes Intravenous Every 8 hours 09/26/23 0011     09/25/23 1715  vancomycin (VANCOCIN) IVPB 1000 mg/200 mL premix        1,000 mg 200 mL/hr over 60 Minutes Intravenous  Once 09/25/23 1712 09/25/23 1838   09/25/23 1715  piperacillin-tazobactam (ZOSYN) IVPB 3.375 g        3.375 g 12.5 mL/hr over 240 Minutes Intravenous  Once 09/25/23 1712 09/25/23 1825        Assessment/Plan Cellulitis/abscess of left buttock  -much improved today on abx therapy -[place] reviewed -no needs for I&D at this time -will order sitz bathes, but may shower as well -suspect this will continue to improve with abx therapy -we will sign off at this time.   I reviewed Consultant GI notes, hospitalist notes, last 24 h vitals and pain scores, last 48 h intake and output, last 24 h labs and trends, and last 24 h imaging results.   LOS: 1 day    Letha Cape ,  [facility] 09/27/2023, 10:28 AM Please see Amion for pager number during day hours 7:00am-4:30pm or 7:00am -11:30am on weekends

## 2023-09-27 NOTE — Progress Notes (Signed)
[name] 1:51 PM  Subjective: Patient seen and examined and well familiar to me from taking care of her in the past although I have not seen her in a few years where she has been followed at [facility] and has tried various Biologics all with some infectious complications and currently she says her diarrhea is about 4-6 times a day mostly at night and not only has she been on Stelara but 5-ASA Colestid and Imodium she has no new complaints and her hospital computer chart was reviewed and case discussed with the other GI team  Objective: Vital signs stable afebrile no acute distress exam please see preassessment evaluation demonstrating okay CBC stable white count decreased CT reviewed as well as pelvic ultrasound  Assessment: indeterminate colitis now with cellulitis and abnormal CT of the rectum  Plan: Okay to proceed with flex sig today with anesthesia assistance  [facility]  office [phone] After 5PM or if no answer call [phone]

## 2023-09-27 NOTE — Plan of Care (Signed)

## 2023-09-27 NOTE — Progress Notes (Signed)
PROGRESS NOTE    Debbie Houston  VHQ:[id number] DOB: May 24, 1941 DOA: 09/25/2023 PCP: Lucky Cowboy, MD    Brief Narrative:   Debbie Houston is a 82 y.o. female with past medical history significant for HTN, HLD, hypothyroidism, GERD, microscopic colitis who presented to [facility] ED on 11/6 for fever, erythema to left buttock progressing over the last week.  She reports has been given antibiotic ointment by her PCP without improvement of symptoms.  Patient reports at baseline has 4-5 bowel movements daily, denies any abdominal pain, no nausea/vomiting, no rectal pain.  Follows with gastroenterology at [facility]  Last clinic visit 07/29/2023.  Was previously on Stelara every 8 weeks for indeterminate colitis, Asacol 2 tabs twice daily, colestipol 2 tabs twice daily and prednisone 10 mg p.o. daily but recently changed from Stelara to Velsipity.  Also with history of C. difficile, recently treated few weeks prior and remains on prophylactic dose of p.o. vancomycin.  In the ED, temperature 98.7 F, HR 92, RR 18, BP 140/79, SpO2 97% on room air.  WBC 16.0, hemoglobin 11.3, platelet count 188.  Sodium 132, potassium 4.0, chloride 99, CO2 25, glucose 120, BUN 10, creatinine 0.79.  AST 17, ALT 14, total bilirubin 0.8.  CT pelvis with contrast with rectal wall thickening, perianal fat stranding extending into the gluteal region and perineum on the left, no abscess or fistula seen, findings compatible with pelvic floor dysfunction, bilateral nephrolithiasis, uterine fibroids, aortic atherosclerosis.  EDP concerned with proctitis, started on vancomycin and Zosyn.  Discussed with on-call GI, Dr. Meridee Score; recommended check ESR, CRP, GI PCR panel, C. difficile panel and continue broad-spectrum IV antibiotics.  Not suspicious for colitis flare at this time and no recommendation to start any steroid treatment.  TRH consulted for admission for further evaluation management of left buttock cellulitis, concern for  proctitis  Assessment & Plan:   Proctitis Left buttock cellulitis Concern for rectal cutaneous fistula Patient presenting to ED with 1 week history of fever, left-sided buttock pain with development of cellulitis and concern for abscess.  Treated previously with outpatient topical antibiotic ointment without improvement.  WBC elevated 16.0, ESR/CRP elevated.  CT  pelvis with contrast with rectal wall thickening, perianal fat stranding extending into the gluteal region and perineum on the left, no abscess or fistula seen.  Ultrasound soft tissue left buttock with probable cellulitis, small focal accumulated subcutaneous fluid which may represent phlegmon without drainable fluid collection at this time. -- [facility] gastroenterology following, appreciate assistance -- WBC 16.0>12.8>9.5 -- Vancomycin, pharmacy consulted for dosing/monitoring -- Zosyn 3.375 g IV every 8 hours -- Continue enteric precautions -- CBC daily -- Pending flex sig today, may need MR pelvis for further evaluation  History of microscopic colitis Follows with [facility] gastroenterology.  Current regimen includes prednisone, Velsipity, Asacol. -- [facility] following as above -- Colestipol 1 g p.o. twice daily -- C. difficile PCR: Negative -- GI PCR panel: Pending -- Fecal calprotectin: Pending   Essential hypertension -- Hold home bisoprolol for now  Hyperlipidemia -- Simvastatin 20 mg p.o. daily  Hypothyroidism -- Levothyroxine 25 mcg p.o. daily    DVT prophylaxis: enoxaparin (LOVENOX) injection 30 mg Start: 09/26/23 1000 SCDs Start: 09/25/23 2355 Place TED hose Start: 09/25/23 2355    Code Status: Full Code Family Communication:   Disposition Plan:  Level of care: Med-Surg Status is: Inpatient Remains inpatient appropriate because: IV antibiotics    Consultants:  [facility] General Surgery  Procedures:  Ultrasound soft tissue left buttock:  Flex sigmoidoscopy:  Pending  Antimicrobials:  Vancomycin 11/6>> Zosyn 11/6>>   Subjective: Patient seen examined bedside, resting calmly.  Lying in bed.  Pressure/pain to left buttock improved.  GI plans flex sig today.  WBC count now with normalized.  Pending fecal calprotectin and GI PCR panel.  Patient denies headache, no fever/chills/night sweats, no nausea/vomiting, no chest pain, no palpitations, no shortness of breath, no abdominal pain, no focal weakness, no fatigue, no paresthesias.  No acute events overnight per nursing staff.  Objective: Vitals:   09/26/23 2131 09/27/23 0500 09/27/23 0509 09/27/23 0806  BP: 116/77  131/76 127/79  Pulse: 87  83 83  Resp: 17  17 17   Temp: 98.8 F (37.1 C)  98.4 F (36.9 C) 98.3 F (36.8 C)  TempSrc: Oral  Oral Oral  SpO2: 98%  97% 97%  Weight:  51.4 kg    Height:        Intake/Output Summary (Last 24 hours) at 09/27/2023 0852 Last data filed at 09/26/2023 1139 Gross per 24 hour  Intake 357 ml  Output --  Net 357 ml   Filed Weights   09/26/23 0500 09/26/23 0738 09/27/23 0500  Weight: 49.2 kg 49.2 kg 51.4 kg    Examination:  Physical Exam: GEN: NAD, alert and oriented x 3, thin in appearance HEENT: NCAT, PERRL, EOMI, sclera clear, MMM PULM: CTAB w/o wheezes/crackles, normal respiratory effort, on room air CV: RRR w/o M/G/R GI: abd soft, NTND, NABS, no R/G/M MSK: no peripheral edema, muscle strength globally intact 5/5 bilateral upper/lower extremities NEURO: CN II-XII intact, no focal deficits, sensation to light touch intact PSYCH: normal mood/affect Integumentary: Left buttock region with erythema (improved 11/8), mild TTP w/ fluctuance/purulent drainage as depicted below, otherwise no other concerning rashes/lesions/wounds noted exposed skin surfaces    Data Reviewed: I have personally reviewed following labs and imaging studies  CBC: Recent Labs  Lab 09/23/23 1110 09/25/23 1708 09/26/23 0908 09/27/23 0627  WBC 10.3 16.0* 12.8* 9.5   NEUTROABS 7,993* 14.0*  --   --   HGB 11.8 11.3* 10.8* 10.3*  HCT 35.1 33.6* 32.9* 31.7*  MCV 98.9 97.4 97.9 97.2  PLT 209 188 194 195   Basic Metabolic Panel: Recent Labs  Lab 09/23/23 1110 09/25/23 1708 09/26/23 0908 09/27/23 0627  NA 133* 132* 135 137  K 4.5 4.0 3.5 3.6  CL 100 99 101 106  CO2 26 25 23 22   GLUCOSE 96 120* 102* 96  BUN 10 10 8  7*  CREATININE 0.77 0.79 0.79 0.68  CALCIUM 9.1 9.6 8.9 8.7*  MG  --   --   --  1.8   GFR: Estimated Creatinine Clearance: 44 mL/min (by C-G formula based on SCr of 0.68 mg/dL). Liver Function Tests: Recent Labs  Lab 09/23/23 1110 09/25/23 1708 09/26/23 0908  AST 19 17 17   ALT 14 14 15   ALKPHOS  --  46 47  BILITOT 0.5 0.8 0.6  PROT 5.9* 6.4* 5.9*  ALBUMIN  --  3.9 3.1*   No results for input(s): "LIPASE", "AMYLASE" in the last 168 hours. No results for input(s): "AMMONIA" in the last 168 hours. Coagulation Profile: No results for input(s): "INR", "PROTIME" in the last 168 hours. Cardiac Enzymes: No results for input(s): "CKTOTAL", "CKMB", "CKMBINDEX", "TROPONINI" in the last 168 hours. BNP (last 3 results) No results for input(s): "PROBNP" in the last 8760 hours. HbA1C: No results for input(s): "HGBA1C" in the last 72 hours. CBG: No results for input(s): "GLUCAP" in the  last 168 hours. Lipid Profile: No results for input(s): "CHOL", "HDL", "LDLCALC", "TRIG", "CHOLHDL", "LDLDIRECT" in the last 72 hours. Thyroid Function Tests: No results for input(s): "TSH", "T4TOTAL", "FREET4", "T3FREE", "THYROIDAB" in the last 72 hours. Anemia Panel: No results for input(s): "VITAMINB12", "FOLATE", "FERRITIN", "TIBC", "IRON", "RETICCTPCT" in the last 72 hours. Sepsis Labs: Recent Labs  Lab 09/25/23 1708  LATICACIDVEN 0.8    Recent Results (from the past 240 hour(s))  Blood Culture (routine x 2)     Status: None (Preliminary result)   Collection Time: 09/25/23  4:52 PM   Specimen: BLOOD  Result Value Ref Range Status    Specimen Description   Final    BLOOD LEFT ANTECUBITAL Performed at [facility], [address], [place], [place] [address]    Special Requests   Final    BOTTLES DRAWN AEROBIC AND ANAEROBIC Blood Culture adequate volume Performed at [facility], [address], [place], [place] [address]    Culture   Final    NO GROWTH 2 DAYS Performed at [facility], [address] [place], [place] [address]    Report Status PENDING  Incomplete  Blood Culture (routine x 2)     Status: None (Preliminary result)   Collection Time: 09/25/23  4:57 PM   Specimen: BLOOD  Result Value Ref Range Status   Specimen Description   Final    BLOOD RIGHT ANTECUBITAL Performed at [facility], [address], [place], [place] [address]    Special Requests   Final    BOTTLES DRAWN AEROBIC AND ANAEROBIC Blood Culture results may not be optimal due to an excessive volume of blood received in culture bottles   Culture   Final    NO GROWTH 2 DAYS Performed at [facility], [address], [place], [place] [address]    Report Status PENDING  Incomplete  C Difficile Quick Screen w PCR reflex     Status: None   Collection Time: 09/26/23 12:03 AM   Specimen: Urine, Clean Catch; Stool  Result Value Ref Range Status   C Diff antigen NEGATIVE NEGATIVE Final   C Diff toxin NEGATIVE NEGATIVE Final   C Diff interpretation No C. difficile detected.  Final    Comment: Performed at [facility], [address] [place], [place] [address]         Radiology Studies: US PELVIS LIMITED (TRANSABDOMINAL ONLY)  Result Date: 09/26/2023 CLINICAL DATA:  Cellulitis and abscess of the left lower extremity. EXAM: US PELVIS LIMITED TECHNIQUE: Ultrasound examination of the pelvic soft tissues was performed in the area of clinical concern. COMPARISON:  None Available. FINDINGS: Targeted sonographic images of the soft tissues of the left buttock in the  region of the clinical concern performed. There is subcutaneous edema. There is a 1.5 x 0.5 x 2.4 cm area of more localized complex fluid in the subcutaneous soft tissues with surrounding hyperemia. Findings may represent phlegmonous changes or developing abscess. No discrete organized wall at this time. IMPRESSION: Probable cellulitis with a small focal accumulated subcutaneous fluid which may represent phlegmon. No drainable fluid collection at this time. The Electronically Signed   By: Elgie Collard M.D.   On: 09/26/2023 18:57   CT PELVIS W CONTRAST  Result Date: 09/25/2023 CLINICAL DATA:  Perianal abscess or fistula suspected. Pain in left buttock for 1 week. EXAM: CT PELVIS WITH CONTRAST TECHNIQUE: Multidetector CT imaging of the pelvis was performed using the standard protocol following  the bolus administration of intravenous contrast. RADIATION DOSE REDUCTION: This exam was performed according to the departmental dose-optimization program which includes automated exposure control, adjustment of the mA and/or kV according to patient size and/or use of iterative reconstruction technique. CONTRAST:  80mL OMNIPAQUE IOHEXOL 300 MG/ML  SOLN COMPARISON:  06/23/2022. FINDINGS: Urinary Tract: Nonobstructive renal calculi are present bilaterally. There is no hydronephrosis. The bladder is unremarkable. Bowel: No bowel obstruction, free air, or pneumatosis. Rectal wall thickening is noted. There is perianal fat stranding on the left extending into the gluteal region and perineum on the left. No abscess or fistula is seen. There is pelvic floor laxity with inferior displacement of the rectum below the pubococcygeal line Vascular/Lymphatic: Aortic atherosclerosis. No pelvic lymphadenopathy is seen. Reproductive: Coarse calcifications are noted in the uterus, compatible with degenerating fibroids. No adnexal mass is seen. Other:  No ascites. Musculoskeletal: Total hip arthroplasty changes are present on the right.  Lumbar spinal fusion hardware is present at L4-L5. There is a compression deformity with augmentation changes at L3. IMPRESSION: 1. Rectal wall thickening with perianal fat stranding extending into the gluteal region and perineum on the left. No abscess or fistula is seen. 2. Findings compatible with pelvic floor dysfunction. 3. Bilateral nephrolithiasis. 4. Uterine fibroids. 5. Aortic atherosclerosis. Electronically Signed   By: Thornell Sartorius M.D.   On: 09/25/2023 20:23        Scheduled Meds:  aspirin EC  81 mg Oral Daily   colestipol  1 g Oral BID   enoxaparin (LOVENOX) injection  30 mg Subcutaneous Q24H   levothyroxine  125 mcg Oral Q0600   simvastatin  20 mg Oral q1800   sodium chloride flush  3 mL Intravenous Q12H   Continuous Infusions:  piperacillin-tazobactam (ZOSYN)  IV 3.375 g (09/27/23 0837)   vancomycin 750 mg (09/26/23 1842)     LOS: 1 day    Time spent: 56 minutes spent on chart review, discussion with nursing staff, consultants, updating family and interview/physical exam; more than 50% of that time was spent in counseling and/or coordination of care.    Alvira Philips [place], DO [facility] Available via Epic secure chat 7am-7pm After these hours, please refer to coverage provider listed on [email or web address] 09/27/2023, 8:52 AM

## 2023-09-27 NOTE — Anesthesia Preprocedure Evaluation (Signed)
Anesthesia Evaluation  Patient identified by MRN, date of birth, ID band Patient awake    Reviewed: Allergy & Precautions, H&P , NPO status , Patient's Chart, lab work & pertinent test results  Airway Mallampati: II   Neck ROM: full    Dental   Pulmonary asthma , former smoker   breath sounds clear to auscultation       Cardiovascular hypertension, + Peripheral Vascular Disease   Rhythm:regular Rate:Normal     Neuro/Psych    GI/Hepatic ,GERD  ,,  Endo/Other  Hypothyroidism    Renal/GU      Musculoskeletal  (+) Arthritis ,    Abdominal   Peds  Hematology   Anesthesia Other Findings   Reproductive/Obstetrics                             Anesthesia Physical Anesthesia Plan  ASA: 3  Anesthesia Plan: MAC   Post-op Pain Management:    Induction: Intravenous  PONV Risk Score and Plan: 2 and Propofol infusion and Treatment may vary due to age or medical condition  Airway Management Planned: Nasal Cannula  Additional Equipment:   Intra-op Plan:   Post-operative Plan:   Informed Consent: I have reviewed the patients History and Physical, chart, labs and discussed the procedure including the risks, benefits and alternatives for the proposed anesthesia with the patient or authorized representative who has indicated his/her understanding and acceptance.     Dental advisory given  Plan Discussed with: CRNA, Anesthesiologist and Surgeon  Anesthesia Plan Comments:        Anesthesia Quick Evaluation

## 2023-09-27 NOTE — Plan of Care (Signed)
  Problem: Education: Goal: Knowledge of General Education information will improve Description: Including pain rating scale, medication(s)/side effects and non-pharmacologic comfort measures Outcome: Progressing   Problem: Health Behavior/Discharge Planning: Goal: Ability to manage health-related needs will improve Outcome: Progressing   Problem: Elimination: Goal: Will not experience complications related to bowel motility Outcome: Progressing   Problem: Pain Management: Goal: General experience of comfort will improve Outcome: Progressing

## 2023-09-27 NOTE — Transfer of Care (Signed)
Immediate Anesthesia Transfer of Care Note  Patient: Debbie Houston  Procedure(s) Performed: FLEXIBLE SIGMOIDOSCOPY BIOPSY  Patient Location: Endoscopy Unit  Anesthesia Type:MAC  Level of Consciousness: awake, alert , and oriented  Airway & Oxygen Therapy: Patient Spontanous Breathing  Post-op Assessment: Report given to RN  Post vital signs: Reviewed and stable  Last Vitals:  Vitals Value Taken Time  BP    Temp    Pulse    Resp    SpO2      Last Pain:  Vitals:   09/27/23 1221  TempSrc: Temporal  PainSc: 0-No pain      Patients Stated Pain Goal: 0 (09/26/23 0852)  Complications: No notable events documented.

## 2023-09-27 NOTE — Progress Notes (Signed)
I have reviewed and concur with this student's documentation.   Reva Bores, RN 09/27/2023 5:48 PM

## 2023-09-27 NOTE — Op Note (Signed)
[facility] Patient Name: Debbie Houston Procedure Date : 09/27/2023 MRN: [id number] Attending MD: Vida Rigger , MD, [id number] Date of Birth: 1941/01/02 CSN: [id number] Age: 82 Admit Type: Inpatient Procedure:                Flexible Sigmoidoscopy Indications:              Diarrhea, Personal history of inflammatory bowel                            disease, Abnormal CT of the GI tract i.e. rectum Providers:                Vida Rigger, MD, Lorenza Evangelist, RN, Salley Scarlet, Technician, Maryjean Morn, CRNA Referring MD:              Medicines:                Monitored Anesthesia Care Complications:            No immediate complications. Estimated Blood Loss:     Estimated blood loss: none. Procedure:                Pre-Anesthesia Assessment:                           - Prior to the procedure, a History and Physical                            was performed, and patient medications and                            allergies were reviewed. The patient's tolerance of                            previous anesthesia was also reviewed. The risks                            and benefits of the procedure and the sedation                            options and risks were discussed with the patient.                            All questions were answered, and informed consent                            was obtained. Prior Anticoagulants: The patient has                            taken heparin, last dose was day of procedure. ASA                            Grade Assessment: II - A patient with mild systemic  disease. After reviewing the risks and benefits,                            the patient was deemed in satisfactory condition to                            undergo the procedure.                           After obtaining informed consent, the scope was                            passed under direct vision. The [id number]-[id number] ([id number]                             Olympus endoscope was introduced through the anus                            and advanced to the the descending colon. The                            flexible sigmoidoscopy was accomplished without                            difficulty. The patient tolerated the procedure                            well. The quality of the bowel preparation was                            adequate. Scope In: 1:58:37 PM Scope Out: 2:04:41 PM Total Procedure Duration: 0 hours 6 minutes 4 seconds  Findings:      Hemorrhoids were found on perianal exam.      External and internal hemorrhoids were found during retroflexion, during       perianal exam and during digital exam. The hemorrhoids were small.      The rectum, sigmoid colon, mid descending colon and distal descending       colon appeared normal. Biopsies for histology were taken with a cold       forceps from the descending colon, sigmoid colon and rectum for       evaluation of microscopic colitis.      The exam was otherwise without abnormality. Impression:               - Hemorrhoids found on perianal exam.                           - External and internal hemorrhoids.                           - The rectum, sigmoid colon, mid descending colon                            and distal descending colon are normal. Biopsied.                           -  The examination was otherwise normal. Recommendation:           - Soft diet today.                           - Continue present medications.                           - Await pathology results.                           - Return to GI clinic PRN.                           - Telephone GI clinic for pathology results in 1                            week.                           - Telephone GI clinic if symptomatic PRN. Procedure Code(s):        --- Professional ---                           [phone], Sigmoidoscopy, flexible; with biopsy, single                            or  multiple Diagnosis Code(s):        --- Professional ---                           R19.7, Diarrhea, unspecified                           Z87.19, Personal history of other diseases of the                            digestive system                           R93.3, Abnormal findings on diagnostic imaging of                            other parts of digestive tract CPT copyright 2022 [employer]. All rights reserved. The codes documented in this report are preliminary and upon coder review may  be revised to meet current compliance requirements. Vida Rigger, MD 09/27/2023 2:17:15 PM This report has been signed electronically. Number of Addenda: 0

## 2023-09-28 ENCOUNTER — Other Ambulatory Visit (HOSPITAL_COMMUNITY): Payer: Self-pay

## 2023-09-28 DIAGNOSIS — K6289 Other specified diseases of anus and rectum: Secondary | ICD-10-CM | POA: Diagnosis not present

## 2023-09-28 LAB — MAGNESIUM: Magnesium: 1.7 mg/dL (ref 1.7–2.4)

## 2023-09-28 LAB — BASIC METABOLIC PANEL
Anion gap: 9 (ref 5–15)
BUN: 9 mg/dL (ref 8–23)
CO2: 23 mmol/L (ref 22–32)
Calcium: 8.7 mg/dL — ABNORMAL LOW (ref 8.9–10.3)
Chloride: 103 mmol/L (ref 98–111)
Creatinine, Ser: 0.76 mg/dL (ref 0.44–1.00)
GFR, Estimated: >60 mL/min (ref >60)
Glucose, Bld: 96 mg/dL (ref 70–99)
Potassium: 4 mmol/L (ref 3.5–5.1)
Sodium: 135 mmol/L (ref 135–145)

## 2023-09-28 LAB — CBC
HCT: 32.8 % — ABNORMAL LOW (ref 36.0–46.0)
Hemoglobin: 10.8 g/dL — ABNORMAL LOW (ref 12.0–15.0)
MCH: 32.6 pg (ref 26.0–34.0)
MCHC: 32.9 g/dL (ref 30.0–36.0)
MCV: 99.1 fL (ref 80.0–100.0)
Platelets: 208 10*3/uL (ref 150–400)
RBC: 3.31 MIL/uL — ABNORMAL LOW (ref 3.87–5.11)
RDW: 13.2 % (ref 11.5–15.5)
WBC: 5.7 10*3/uL (ref 4.0–10.5)
nRBC: 0 % (ref 0.0–0.2)

## 2023-09-28 MED ORDER — ENOXAPARIN SODIUM 40 MG/0.4ML IJ SOSY
40.0000 mg | PREFILLED_SYRINGE | INTRAMUSCULAR | Status: DC
  Administered 2023-09-28: 40 mg via SUBCUTANEOUS
  Filled 2023-09-28: qty 0.4

## 2023-09-28 MED ORDER — AMOXICILLIN-POT CLAVULANATE 875-125 MG PO TABS
1.0000 | ORAL_TABLET | Freq: Two times a day (BID) | ORAL | 0 refills | Status: AC
  Filled 2023-09-28: qty 16, 8d supply, fill #0

## 2023-09-28 MED ORDER — LINEZOLID 600 MG PO TABS
600.0000 mg | ORAL_TABLET | Freq: Two times a day (BID) | ORAL | 0 refills | Status: AC
  Filled 2023-09-28: qty 16, 8d supply, fill #0

## 2023-09-28 NOTE — Progress Notes (Signed)
Pt discharged home this pm after going over discharge education with no concerns expressed. TOC meds given pre discharge

## 2023-09-28 NOTE — Discharge Summary (Signed)
Physician Discharge Summary  Debbie Houston UJW:[id number] DOB: [birth year] DOA: 09/25/2023  PCP: Lucky Cowboy, MD  Admit date: 09/25/2023 Discharge date: 09/28/2023  Admitted From: Home Disposition: Home  Recommendations for Outpatient Follow-up:  Follow up with PCP in 1-2 weeks Follow-up with GI, Dr. Jacqulyn Bath as scheduled Continue antibiotics with linezolid, Augmentin to complete 10-day course for left buttock cellulitis with phlegmon Follow-up fecal calprotectin which was pending at time of discharge.  Home Health: no Equipment/Devices: none  Discharge Condition: Stable CODE STATUS: Full Code Diet recommendation: Heart healthy diet  History of present illness:  Debbie Houston is a 82 y.o. female with past medical history significant for HTN, HLD, hypothyroidism, GERD, microscopic colitis who presented to [facility] ED on 11/6 for fever, erythema to left buttock progressing over the last week.  She reports has been given antibiotic ointment by her PCP without improvement of symptoms.  Patient reports at baseline has 4-5 bowel movements daily, denies any abdominal pain, no nausea/vomiting, no rectal pain.   Follows with gastroenterology at [facility].  Last clinic visit 07/29/2023.  Was previously on Stelara every 8 weeks for indeterminate colitis, Asacol 2 tabs twice daily, colestipol 2 tabs twice daily and prednisone 10 mg p.o. daily but recently changed from Stelara to Velsipity.  Also with history of C. difficile, recently treated few weeks prior and remains on prophylactic dose of p.o. vancomycin.   In the ED, temperature 98.7 F, HR 92, RR 18, BP 140/79, SpO2 97% on room air.  WBC 16.0, hemoglobin 11.3, platelet count 188.  Sodium 132, potassium 4.0, chloride 99, CO2 25, glucose 120, BUN 10, creatinine 0.79.  AST 17, ALT 14, total bilirubin 0.8.  CT pelvis with contrast with rectal wall thickening, perianal fat stranding extending into the gluteal region and perineum on the left, no abscess or  fistula seen, findings compatible with pelvic floor dysfunction, bilateral nephrolithiasis, uterine fibroids, aortic atherosclerosis.  EDP concerned with proctitis, started on vancomycin and Zosyn.  Discussed with on-call GI, Dr. Meridee Score; recommended check ESR, CRP, GI PCR panel, C. difficile panel and continue broad-spectrum IV antibiotics.  Not suspicious for colitis flare at this time and no recommendation to start any steroid treatment.  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  Treated previously with outpatient topical antibiotic ointment without improvement.  WBC elevated 16.0, ESR/CRP elevated.  CT  pelvis with contrast with rectal wall thickening, perianal fat stranding extending into the gluteal region and perineum on the left, no abscess or fistula seen.  Ultrasound soft tissue left buttock with probable cellulitis, small focal accumulated subcutaneous fluid which may represent phlegmon without drainable fluid collection at this time.  General surgery was consulted and no surgical needs identified.  Gastroenterology was consulted and patient underwent flex sigmoidoscopy with normal findings regarding rectum, sigmoid colon, descending colon and underwent biopsy, only notable for external/internal hemorrhoids.  WBC count improved from 16.0-5.7 at time of discharge.  Patient cellulitis improved remarkably with IV vancomycin and Zosyn and will continue linezolid and Augmentin on discharge to complete 10-day course.  Outpatient follow-up with PCP in 1-2 weeks.   History of microscopic colitis Follows with [facility] gastroenterology.  Current regimen includes Velsipity, mesalamine, colestipol. Fecal calprotectin  ordered and lab pending at time of discharge.  Outpatient follow-up with [facility] gastroenterology, Dr. Jacqulyn Bath as scheduled.  Hx c. difficile colitis C. difficile PCR and GI PCR panel negative on admission.  Continue oral vancomycin 120 mg p.o. twice daily   Essential hypertension Continue home bisoprolol   Hyperlipidemia Simvastatin 20 mg p.o. daily   Hypothyroidism Levothyroxine 88 mcg p.o. daily  Discharge Diagnoses:  Principal Problem:   Proctitis Active Problems:   History of microscopic colitis   Essential hypertension   Hyperlipidemia   Hypothyroidism   GERD (gastroesophageal reflux disease)   Hyponatremia   Cellulitis of left buttock    Discharge Instructions  Discharge Instructions     Call MD for:  difficulty breathing, headache or visual disturbances   Complete by: As directed    Call MD for:  extreme fatigue   Complete by: As directed    Call MD for:  persistant dizziness or light-headedness   Complete by: As directed    Call MD for:  persistant nausea and vomiting   Complete by: As directed    Call MD for:  severe uncontrolled pain   Complete by: As directed    Call MD for:  temperature >100.4   Complete by: As directed    Diet - low sodium heart healthy   Complete by: As directed    Increase activity slowly   Complete by: As directed       Allergies as of 09/28/2023       Reactions   Remicade [infliximab] Other (See Comments)   Unknown    Fosamax [alendronate Sodium] Other (See Comments)   GI upset    Montelukast Other (See Comments)   Reaction:  Makes pt jittery    Reaction:  Makes pt jittery  Reaction:  Makes pt jittery Makes pt jittery  Makes pt jittery   Singulair [montelukast Sodium] Other (See Comments)   Makes pt jittery   Wellbutrin [bupropion] Hives   Clindamycin/lincomycin Rash   Sulfa Antibiotics Rash        Medication List     TAKE these medications    acetaminophen 500 MG tablet Commonly known as: TYLENOL Take 500 mg by  mouth as needed for moderate pain (pain score 4-6) or headache.   amoxicillin-clavulanate 875-125 MG tablet Commonly known as: AUGMENTIN Take 1 tablet by mouth 2 (two) times daily for 8 days.   aspirin EC 81 MG tablet Take 1 tablet (81 mg total) by mouth in the morning and at bedtime. What changed: when to take this   bisoprolol 5 MG tablet Commonly known as: ZEBETA Take 1 tablet every Morning for BP What changed:  how much to take how to take this when to take this additional instructions   CALCIUM 1200 PO Take 1,200 mg by mouth daily.   CINNAMON PO Take 2,000 mg by mouth daily.   clotrimazole-betamethasone cream Commonly known as: LOTRISONE Apply 1 Application topically 2 (two) times daily.   COLESTIPOL HCL PO Take 2 tablets by mouth in the morning and at bedtime.   CoQ10 200 MG Caps Take 200 mg by mouth daily.   cycloSPORINE 0.05 % ophthalmic emulsion Commonly known as: RESTASIS Place 2 drops into both eyes 2 (two) times daily.   Fish Oil 1200 MG Caps Take 1,200 mg by mouth daily.   FLONASE NA Place 1 spray into the nose as needed (allergies).   levothyroxine 88 MCG tablet Commonly known as: SYNTHROID Take 88 mcg by mouth daily before breakfast.   linezolid 600 MG tablet Commonly known as:  ZYVOX Take 1 tablet (600 mg total) by mouth 2 (two) times daily for 8 days.   loperamide 2 MG capsule Commonly known as: IMODIUM Take 2 mg by mouth 2 (two) times daily as needed for diarrhea or loose stools.   meclizine 25 MG tablet Commonly known as: ANTIVERT 1/2-1 pill up to 3 times daily for motion sickness/dizziness What changed:  how much to take how to take this when to take this reasons to take this additional instructions   mesalamine 1.2 g EC tablet Commonly known as: LIALDA Take 2.4 g by mouth in the morning and at bedtime.   pantoprazole 40 MG tablet Commonly known as: Protonix Take 1 tablet  2 x /day  for Heartburn & Indigestion What changed:   how much to take how to take this when to take this additional instructions   PROBIOTIC PO Take 1 capsule by mouth daily.   Prolia 60 MG/ML Sosy injection Generic drug: denosumab Inject 60 mg into the skin every 6 (six) months.   pseudoephedrine 120 MG 12 hr tablet Commonly known as: SUDAFED Take  1 tablet  2 x /day (every 12 hours)  for Head and Chest Congestion What changed:  how much to take how to take this when to take this reasons to take this additional instructions   simvastatin 20 MG tablet Commonly known as: ZOCOR Take 1 tablet at Bedtime for Cholesterol   THERA VITAMIN PO Take 1 tablet by mouth daily.   triamcinolone ointment 0.5 % Commonly known as: KENALOG Apply 1 Application topically 2 (two) times daily.   vancomycin 125 MG capsule Commonly known as: VANCOCIN Take 125 mg by mouth in the morning and at bedtime.   Velsipity 2 MG Tabs Generic drug: Etrasimod Arginine Take 2 mg by mouth daily.   vitamin C 1000 MG tablet Take 1,000 mg by mouth daily.   ZyrTEC Allergy 10 MG tablet Generic drug: cetirizine Take 10 mg by mouth daily as needed for allergies.        Follow-up Information     Lucky Cowboy, MD. Schedule an appointment as soon as possible for a visit in 1 week(s).   Specialty: Internal Medicine Contact information: [address] [place] [address] [phone]         Gwen Pounds, MD. Go to.   Specialty: Internal Medicine Contact information: [id number] [employer] [facility] [place] [address] [phone]                Allergies  Allergen Reactions   Remicade [Infliximab] Other (See Comments)    Unknown    Fosamax [Alendronate Sodium] Other (See Comments)    GI upset    Montelukast Other (See Comments)    Reaction:  Makes pt jittery    Reaction:  Makes pt jittery  Reaction:  Makes pt jittery Makes pt jittery  Makes pt jittery   Singulair [Montelukast Sodium]  Other (See Comments)    Makes pt jittery   Wellbutrin [Bupropion] Hives   Clindamycin/Lincomycin Rash   Sulfa Antibiotics Rash    Consultations: [facility] General Surgery   Procedures/Studies: US PELVIS LIMITED (TRANSABDOMINAL ONLY)  Result Date: 09/26/2023 CLINICAL DATA:  Cellulitis and abscess of the left lower extremity. EXAM: US PELVIS LIMITED TECHNIQUE: Ultrasound examination of the pelvic soft tissues was performed in the area of clinical concern. COMPARISON:  None Available. FINDINGS: Targeted sonographic images of the soft tissues of the left buttock in the region of the clinical  concern performed. There is subcutaneous edema. There is a 1.5 x 0.5 x 2.4 cm area of more localized complex fluid in the subcutaneous soft tissues with surrounding hyperemia. Findings may represent phlegmonous changes or developing abscess. No discrete organized wall at this time. IMPRESSION: Probable cellulitis with a small focal accumulated subcutaneous fluid which may represent phlegmon. No drainable fluid collection at this time. The Electronically Signed   By: Elgie Collard M.D.   On: 09/26/2023 18:57   CT PELVIS W CONTRAST  Result Date: 09/25/2023 CLINICAL DATA:  Perianal abscess or fistula suspected. Pain in left buttock for 1 week. EXAM: CT PELVIS WITH CONTRAST TECHNIQUE: Multidetector CT imaging of the pelvis was performed using the standard protocol following the bolus administration of intravenous contrast. RADIATION DOSE REDUCTION: This exam was performed according to the departmental dose-optimization program which includes automated exposure control, adjustment of the mA and/or kV according to patient size and/or use of iterative reconstruction technique. CONTRAST:  80mL OMNIPAQUE IOHEXOL 300 MG/ML  SOLN COMPARISON:  06/23/2022. FINDINGS: Urinary Tract: Nonobstructive renal calculi are present bilaterally. There is no hydronephrosis. The bladder is unremarkable. Bowel: No  bowel obstruction, free air, or pneumatosis. Rectal wall thickening is noted. There is perianal fat stranding on the left extending into the gluteal region and perineum on the left. No abscess or fistula is seen. There is pelvic floor laxity with inferior displacement of the rectum below the pubococcygeal line Vascular/Lymphatic: Aortic atherosclerosis. No pelvic lymphadenopathy is seen. Reproductive: Coarse calcifications are noted in the uterus, compatible with degenerating fibroids. No adnexal mass is seen. Other:  No ascites. Musculoskeletal: Total hip arthroplasty changes are present on the right. Lumbar spinal fusion hardware is present at L4-L5. There is a compression deformity with augmentation changes at L3. IMPRESSION: 1. Rectal wall thickening with perianal fat stranding extending into the gluteal region and perineum on the left. No abscess or fistula is seen. 2. Findings compatible with pelvic floor dysfunction. 3. Bilateral nephrolithiasis. 4. Uterine fibroids. 5. Aortic atherosclerosis. Electronically Signed   By: Thornell Sartorius M.D.   On: 09/25/2023 20:23     Subjective: Patient seen examined bedside, resting calmly.  Sitting at edge of bed eating breakfast.  RN present at bedside.  Cellulitis markedly improved.  White count within normal limits.  Patient requesting to be discharged home today.  Has follow-up planned with her primary gastroenterologist at [facility] upcoming.  Discussed will continue antibiotics on discharge complete 10-day course.  No other specific questions or concerns at this time.  Denies headache, no dizziness, no chest pain, no palpitations, no shortness of breath, no abdominal pain, no fever/chills/night sweats, no nausea/vomiting/diarrhea, no focal weakness, no fatigue, no paresthesias.  No acute events overnight per nurse staff.  Discharge Exam: Vitals:   09/28/23 0444 09/28/23 0751  BP: 131/80 (!) 145/90  Pulse: 79 79  Resp: 18 17  Temp: 98.1 F (36.7 C) 97.9 F (36.6  C)  SpO2: 97% 99%   Vitals:   09/27/23 1958 09/28/23 0444 09/28/23 0445 09/28/23 0751  BP: 132/85 131/80  (!) 145/90  Pulse: 87 79  79  Resp: 18 18  17   Temp: 98.3 F (36.8 C) 98.1 F (36.7 C)  97.9 F (36.6 C)  TempSrc: Oral Oral  Oral  SpO2: 99% 97%  99%  Weight:   51.5 kg   Height:        Physical Exam: GEN: NAD, alert and oriented x 3, wd/wn HEENT: NCAT, PERRL, EOMI, sclera clear, MMM PULM: CTAB  w/o wheezes/crackles, normal respiratory effort, on room air CV: RRR w/o M/G/R GI: abd soft, NTND, NABS, no R/G/M MSK: no peripheral edema, muscle strength globally intact 5/5 bilateral upper/lower extremities NEURO: CN II-XII intact, no focal deficits, sensation to light touch intact PSYCH: normal mood/affect Integumentary: Left buttock region with erythema (improved 11/8 and 11/9), mild TTP w/ fluctuance as depicted below, otherwise no other concerning rashes/lesions/wounds noted exposed skin surfaces     The results of significant diagnostics from this hospitalization (including imaging, microbiology, ancillary and laboratory) are listed below for reference.     Microbiology: Recent Results (from the past 240 hour(s))  Blood Culture (routine x 2)     Status: None (Preliminary result)   Collection Time: 09/25/23  4:52 PM   Specimen: BLOOD  Result Value Ref Range Status   Specimen Description   Final    BLOOD LEFT ANTECUBITAL Performed at [facility], [address] [place], [place] [address]    Special Requests   Final    BOTTLES DRAWN AEROBIC AND ANAEROBIC Blood Culture adequate volume Performed at [facility], [address], [place], [place] [address]    Culture   Final    NO GROWTH 3 DAYS Performed at [facility], [address] [place], [place] [address]    Report Status PENDING  Incomplete  Blood Culture (routine x 2)     Status: None (Preliminary result)   Collection Time: 09/25/23  4:57 PM   Specimen:  BLOOD  Result Value Ref Range Status   Specimen Description   Final    BLOOD RIGHT ANTECUBITAL Performed at [facility], [address] [place], [place] [address]    Special Requests   Final    BOTTLES DRAWN AEROBIC AND ANAEROBIC Blood Culture results may not be optimal due to an excessive volume of blood received in culture bottles   Culture   Final    NO GROWTH 3 DAYS Performed at [facility], [address], [place], [place] [address]    Report Status PENDING  Incomplete  C Difficile Quick Screen w PCR reflex     Status: None   Collection Time: 09/26/23 12:03 AM   Specimen: Urine, Clean Catch; Stool  Result Value Ref Range Status   C Diff antigen NEGATIVE NEGATIVE Final   C Diff toxin NEGATIVE NEGATIVE Final   C Diff interpretation No C. difficile detected.  Final    Comment: Performed at [facility], [address], [place], [place] [address]  Gastrointestinal Panel by PCR , Stool     Status: None   Collection Time: 09/26/23 12:03 AM   Specimen: Urine, Clean Catch; Stool  Result Value Ref Range Status   Campylobacter species NOT DETECTED NOT DETECTED Final   Plesimonas shigelloides NOT DETECTED NOT DETECTED Final   Salmonella species NOT DETECTED NOT DETECTED Final   Yersinia enterocolitica NOT DETECTED NOT DETECTED Final   Vibrio species NOT DETECTED NOT DETECTED Final   Vibrio cholerae NOT DETECTED NOT DETECTED Final   Enteroaggregative E coli (EAEC) NOT DETECTED NOT DETECTED Final   Enteropathogenic E coli (EPEC) NOT DETECTED NOT DETECTED Final   Enterotoxigenic E coli (ETEC) NOT DETECTED NOT DETECTED Final   Shiga like toxin producing E coli (STEC) NOT DETECTED NOT DETECTED Final   Shigella/Enteroinvasive E coli (EIEC) NOT DETECTED NOT DETECTED Final   Cryptosporidium NOT DETECTED NOT DETECTED Final   Cyclospora cayetanensis NOT DETECTED NOT DETECTED Final   Entamoeba  histolytica NOT DETECTED NOT DETECTED Final   Giardia lamblia NOT  DETECTED NOT DETECTED Final   Adenovirus F40/41 NOT DETECTED NOT DETECTED Final   Astrovirus NOT DETECTED NOT DETECTED Final   Norovirus GI/GII NOT DETECTED NOT DETECTED Final   Rotavirus A NOT DETECTED NOT DETECTED Final   Sapovirus (I, II, IV, and V) NOT DETECTED NOT DETECTED Final    Comment: Performed at [facility], [address] [place], [place] [address]     Labs: BNP (last 3 results) No results for input(s): "BNP" in the last 8760 hours. Basic Metabolic Panel: Recent Labs  Lab 09/23/23 1110 09/25/23 1708 09/26/23 0908 09/27/23 0627 09/28/23 0611  NA 133* 132* 135 137 135  K 4.5 4.0 3.5 3.6 4.0  CL 100 99 101 106 103  CO2 26 25 23 22 23   GLUCOSE 96 120* 102* 96 96  BUN 10 10 8  7* 9  CREATININE 0.77 0.79 0.79 0.68 0.76  CALCIUM 9.1 9.6 8.9 8.7* 8.7*  MG  --   --   --  1.8 1.7   Liver Function Tests: Recent Labs  Lab 09/23/23 1110 09/25/23 1708 09/26/23 0908  AST 19 17 17   ALT 14 14 15   ALKPHOS  --  46 47  BILITOT 0.5 0.8 0.6  PROT 5.9* 6.4* 5.9*  ALBUMIN  --  3.9 3.1*   No results for input(s): "LIPASE", "AMYLASE" in the last 168 hours. No results for input(s): "AMMONIA" in the last 168 hours. CBC: Recent Labs  Lab 09/23/23 1110 09/25/23 1708 09/26/23 0908 09/27/23 0627 09/28/23 0611  WBC 10.3 16.0* 12.8* 9.5 5.7  NEUTROABS 7,993* 14.0*  --   --   --   HGB 11.8 11.3* 10.8* 10.3* 10.8*  HCT 35.1 33.6* 32.9* 31.7* 32.8*  MCV 98.9 97.4 97.9 97.2 99.1  PLT 209 188 194 195 208   Cardiac Enzymes: No results for input(s): "CKTOTAL", "CKMB", "CKMBINDEX", "TROPONINI" in the last 168 hours. BNP: Invalid input(s): "POCBNP" CBG: No results for input(s): "GLUCAP" in the last 168 hours. D-Dimer No results for input(s): "DDIMER" in the last 72 hours. Hgb A1c No results for input(s): "HGBA1C" in the last 72 hours. Lipid Profile No results for input(s): "CHOL", "HDL", "LDLCALC", "TRIG", "CHOLHDL", "LDLDIRECT" in the last 72 hours. Thyroid  function studies No results for input(s): "TSH", "T4TOTAL", "T3FREE", "THYROIDAB" in the last 72 hours.  Invalid input(s): "FREET3" Anemia work up No results for input(s): "VITAMINB12", "FOLATE", "FERRITIN", "TIBC", "IRON", "RETICCTPCT" in the last 72 hours. Urinalysis    Component Value Date/Time   COLORURINE YELLOW 07/31/2023 1008   APPEARANCEUR CLEAR 07/31/2023 1008   LABSPEC 1.006 07/31/2023 1008   PHURINE 6.0 07/31/2023 1008   GLUCOSEU NEGATIVE 07/31/2023 1008   HGBUR NEGATIVE 07/31/2023 1008   BILIRUBINUR NEGATIVE 06/23/2022 0015   KETONESUR NEGATIVE 07/31/2023 1008   PROTEINUR NEGATIVE 07/31/2023 1008   UROBILINOGEN 0.2 05/05/2015 1030   NITRITE NEGATIVE 07/31/2023 1008   LEUKOCYTESUR NEGATIVE 07/31/2023 1008   Sepsis Labs Recent Labs  Lab 09/25/23 1708 09/26/23 0908 09/27/23 0627 09/28/23 0611  WBC 16.0* 12.8* 9.5 5.7   Microbiology Recent Results (from the past 240 hour(s))  Blood Culture (routine x 2)     Status: None (Preliminary result)   Collection Time: 09/25/23  4:52 PM   Specimen: BLOOD  Result Value Ref Range Status   Specimen Description   Final    BLOOD LEFT ANTECUBITAL Performed at [facility], [address] [place], [place] [address]  Special Requests   Final    BOTTLES DRAWN AEROBIC AND ANAEROBIC Blood Culture adequate volume Performed at [facility], [address], [place], [place] [address]    Culture   Final    NO GROWTH 3 DAYS Performed at [facility], [address], [place], [place] [address]    Report Status PENDING  Incomplete  Blood Culture (routine x 2)     Status: None (Preliminary result)   Collection Time: 09/25/23  4:57 PM   Specimen: BLOOD  Result Value Ref Range Status   Specimen Description   Final    BLOOD RIGHT ANTECUBITAL Performed at [facility], [address] [place], [place] [address]    Special Requests   Final    BOTTLES DRAWN AEROBIC  AND ANAEROBIC Blood Culture results may not be optimal due to an excessive volume of blood received in culture bottles   Culture   Final    NO GROWTH 3 DAYS Performed at [facility], [address] [place], [place] [address]    Report Status PENDING  Incomplete  C Difficile Quick Screen w PCR reflex     Status: None   Collection Time: 09/26/23 12:03 AM   Specimen: Urine, Clean Catch; Stool  Result Value Ref Range Status   C Diff antigen NEGATIVE NEGATIVE Final   C Diff toxin NEGATIVE NEGATIVE Final   C Diff interpretation No C. difficile detected.  Final    Comment: Performed at [facility], [address] [place], [place] [address]  Gastrointestinal Panel by PCR , Stool     Status: None   Collection Time: 09/26/23 12:03 AM   Specimen: Urine, Clean Catch; Stool  Result Value Ref Range Status   Campylobacter species NOT DETECTED NOT DETECTED Final   Plesimonas shigelloides NOT DETECTED NOT DETECTED Final   Salmonella species NOT DETECTED NOT DETECTED Final   Yersinia enterocolitica NOT DETECTED NOT DETECTED Final   Vibrio species NOT DETECTED NOT DETECTED Final   Vibrio cholerae NOT DETECTED NOT DETECTED Final   Enteroaggregative E coli (EAEC) NOT DETECTED NOT DETECTED Final   Enteropathogenic E coli (EPEC) NOT DETECTED NOT DETECTED Final   Enterotoxigenic E coli (ETEC) NOT DETECTED NOT DETECTED Final   Shiga like toxin producing E coli (STEC) NOT DETECTED NOT DETECTED Final   Shigella/Enteroinvasive E coli (EIEC) NOT DETECTED NOT DETECTED Final   Cryptosporidium NOT DETECTED NOT DETECTED Final   Cyclospora cayetanensis NOT DETECTED NOT DETECTED Final   Entamoeba histolytica NOT DETECTED NOT DETECTED Final   Giardia lamblia NOT DETECTED NOT DETECTED Final   Adenovirus F40/41 NOT DETECTED NOT DETECTED Final   Astrovirus NOT DETECTED NOT DETECTED Final   Norovirus GI/GII NOT DETECTED NOT DETECTED Final   Rotavirus A NOT DETECTED NOT DETECTED Final   Sapovirus (I, II,  IV, and V) NOT DETECTED NOT DETECTED Final    Comment: Performed at [facility], [address], [place], [place] [address]     Time coordinating discharge: Over 30 minutes  SIGNED:   Alvira Philips [place], DO  [facility] 09/28/2023, 9:37 AM

## 2023-09-28 NOTE — Progress Notes (Signed)
   09/28/23 0957  Mobility  Activity Ambulated independently in hallway  Level of Assistance Standby assist, set-up cues, supervision of patient - no hands on  Assistive Device None  Distance Ambulated (ft) 450 ft  Activity Response Tolerated well  $Mobility charge 1 Mobility  Mobility Specialist Start Time (ACUTE ONLY) [id number]  Mobility Specialist Stop Time (ACUTE ONLY) [id number]  Mobility Specialist Time Calculation (min) (ACUTE ONLY) 15 min   Mobility Specialist: Progress Note  Pt agreeable to mobility session - received in bed. Required SV with no AD. Pt was asymptomatic throughout session with no complaints. Returned to chair with all needs met - call bell within reach.   Barnie Mort, BS Mobility Specialist Please contact via SecureChat or Rehab office at [phone].

## 2023-09-29 ENCOUNTER — Encounter (HOSPITAL_COMMUNITY): Payer: Self-pay | Admitting: Gastroenterology

## 2023-09-29 LAB — CALPROTECTIN, FECAL: Calprotectin, Fecal: 268 ug/g — ABNORMAL HIGH (ref 0–120)

## 2023-09-30 ENCOUNTER — Ambulatory Visit: Admit: 2023-09-30 | Discharge: 2023-10-01 | Payer: MEDICARE | Attending: Internal Medicine | Primary: Internal Medicine

## 2023-09-30 ENCOUNTER — Telehealth: Payer: Self-pay

## 2023-09-30 DIAGNOSIS — K52839 Microscopic colitis, unspecified: Principal | ICD-10-CM

## 2023-09-30 DIAGNOSIS — L03317 Cellulitis of buttock: Principal | ICD-10-CM

## 2023-09-30 DIAGNOSIS — K611 Rectal abscess: Secondary | ICD-10-CM | POA: Diagnosis not present

## 2023-09-30 LAB — CULTURE, BLOOD (ROUTINE X 2)
Culture: NO GROWTH
Culture: NO GROWTH
Special Requests: ADEQUATE

## 2023-09-30 LAB — SURGICAL PATHOLOGY

## 2023-09-30 NOTE — Transitions of Care (Post Inpatient/ED Visit) (Signed)
09/30/2023  Name: Debbie Houston MRN: [id number] DOB: 1941/10/24  Today's TOC FU Call Status: Today's TOC FU Call Status:: Successful TOC FU Call Completed TOC FU Call Complete Date: 09/30/23 Patient's Name and Date of Birth confirmed.  Transition Care Management Follow-up Telephone Call Date of Discharge: 09/28/23 Discharge Facility: [facility]) Type of Discharge: Inpatient Admission Primary Inpatient Discharge Diagnosis:: cellulitis of Right buttock How have you been since you were released from the hospital?: Better Any questions or concerns?: No  Items Reviewed: Did you receive and understand the discharge instructions provided?: Yes Medications obtained,verified, and reconciled?: Yes (Medications Reviewed) Any new allergies since your discharge?: No Dietary orders reviewed?: No Do you have support at home?: Yes People in Home: sibling(s) Name of Support/Comfort Primary Source: sister  Medications Reviewed Today: Medications Reviewed Today     Reviewed by Marcos Eke, RN (Registered Nurse) on 09/30/23 at 1654  Med List Status: <None>   Medication Order Taking? Sig Documenting Provider Last Dose Status Informant  acetaminophen (TYLENOL) 500 MG tablet [id number] No Take 500 mg by mouth as needed for moderate pain (pain score 4-6) or headache. [provider] Past Week Active Self, Pharmacy Records  amoxicillin-clavulanate (AUGMENTIN) 875-125 MG tablet [id number]  Take 1 tablet by mouth 2 (two) times daily for 8 days. [place], Alvira Philips, DO  Active   Ascorbic Acid (VITAMIN C) 1000 MG tablet [id number] No Take 1,000 mg by mouth daily.  [provider] Past Week Active Self, Pharmacy Records  aspirin EC 81 MG tablet [id number] No Take 1 tablet (81 mg total) by mouth in the morning and at bedtime.  Patient taking differently: Take 81 mg by mouth at bedtime.   Joen Laura, MD Past Week Active Self, Pharmacy Records  bisoprolol (ZEBETA) 5 MG tablet  [id number] No Take 1 tablet every Morning for BP  Patient taking differently: Take 5 mg by mouth at bedtime.   Lucky Cowboy, MD Past Week Active Self, Pharmacy Records  Calcium Carbonate-Vit D-Min (CALCIUM 1200 PO) [id number] No Take 1,200 mg by mouth daily. [provider] Past Week Active Self, Pharmacy Records  cetirizine (ZYRTEC ALLERGY) 10 MG tablet [id number] No Take 10 mg by mouth daily as needed for allergies. [provider] unk Active Self, Pharmacy Records  CINNAMON PO [id number] No Take 2,000 mg by mouth daily. [provider] Past Week Active Self, Pharmacy Records  clotrimazole-betamethasone (LOTRISONE) cream [id number] No Apply 1 Application topically 2 (two) times daily.  Patient not taking: Reported on 09/27/2023   Raynelle Dick, NP Not Taking Active Self, Pharmacy Records  Coenzyme Q10 (COQ10) 200 MG CAPS [id number] No Take 200 mg by mouth daily. [provider] Past Week Active Self, Pharmacy Records  COLESTIPOL HCL PO [id number] No Take 2 tablets by mouth in the morning and at bedtime. [provider] Past Week Active Self, Pharmacy Records           Med Note (CRUTHIS, CHLOE C   Fri Sep 27, 2023 11:12 AM) Pt is unsure of the dose of this medication. No fill hx found.   cycloSPORINE (RESTASIS) 0.05 % ophthalmic emulsion [id number] No Place 2 drops into both eyes 2 (two) times daily. [provider] 09/27/2023 Active Self, Pharmacy Records           Med Note (CRUTHIS, CHLOE C   Fri Sep 27, 2023 11:13 AM) Pt is adamant she is still using this medication. Dispense report does not support this claim.  Etrasimod Arginine (VELSIPITY) 2 MG TABS [id number] No Take 2 mg by mouth daily.  Patient not taking: Reported on 09/27/2023   Adela Glimpse, NP Not Taking Active Self, Pharmacy Records  Fluticasone Propionate [facility] NA) [id number] No Place 1 spray into the nose as needed (allergies). [provider] Past Week Active Self,  Pharmacy Records  levothyroxine (SYNTHROID) 88 MCG tablet [id number] No Take 88 mcg by mouth daily before breakfast. [provider] Past Week Active Self, Pharmacy Records  linezolid (ZYVOX) 600 MG tablet [id number]  Take 1 tablet (600 mg total) by mouth 2 (two) times daily for 8 days. [place], Alvira Philips, DO  Active   loperamide (IMODIUM) 2 MG capsule [id number] No Take 2 mg by mouth 2 (two) times daily as needed for diarrhea or loose stools. [provider] Past Week Active Self, Pharmacy Records  meclizine (ANTIVERT) 25 MG tablet [id number] No 1/2-1 pill up to 3 times daily for motion sickness/dizziness  Patient taking differently: Take 25 mg by mouth as needed for dizziness or nausea.   Raynelle Dick, NP unk Active Self, Pharmacy Records           Med Note (CRUTHIS, CHLOE C   Fri Sep 27, 2023 11:13 AM) Pt is unsure of last dose.   mesalamine (LIALDA) 1.2 g EC tablet [id number] No Take 2.4 g by mouth in the morning and at bedtime. [provider] Past Week Active Self, Pharmacy Records  Multiple Vitamin (THERA VITAMIN PO) [id number] No Take 1 tablet by mouth daily. [provider] Past Week Active Self, Pharmacy Records  Omega-3 Fatty Acids (FISH OIL) 1200 MG CAPS [id number] No Take 1,200 mg by mouth daily.  [provider] Past Week Active Self, Pharmacy Records  pantoprazole (PROTONIX) 40 MG tablet [id number] No Take 1 tablet  2 x /day  for Heartburn & Indigestion  Patient taking differently: Take 40 mg by mouth 2 (two) times daily.   Lucky Cowboy, MD Past Week Active Self, Pharmacy Records           Med Note (CRUTHIS, Marcy Siren   Fri Sep 27, 2023 11:14 AM) Pt is adamant she is still taking this medication BID. Dispense report does not support this claim.   Probiotic Product (PROBIOTIC PO) [id number] No Take 1 capsule by mouth daily. [provider] Past Week Active Self, Pharmacy Records  PROLIA 60 MG/ML SOSY injection [id number] No Inject 60 mg  into the skin every 6 (six) months. [provider] unk Active Self, Pharmacy Records           Med Note (CRUTHIS, CHLOE C   Fri Sep 27, 2023 11:15 AM) Pt was unable to recall when this medication was last injected   pseudoephedrine (SUDAFED) 120 MG 12 hr tablet [id number] No Take  1 tablet  2 x /day (every 12 hours)  for Head and Chest Congestion  Patient taking differently: Take 120 mg by mouth as needed for congestion.   Lucky Cowboy, MD unk Active Self, Pharmacy Records           Med Note (CRUTHIS, Marcy Siren   Fri Sep 27, 2023 11:15 AM) Pt is unsure of last dose.   simvastatin (ZOCOR) 20 MG tablet [id number] No Take 1 tablet at Bedtime for Cholesterol Adela Glimpse, NP Past Week Active Self, Pharmacy Records  triamcinolone ointment (KENALOG) 0.5 % [id number] No Apply 1 Application topically 2 (two) times daily.  Patient not taking: Reported on 09/27/2023   Raynelle Dick,  NP Not Taking Active Self, Pharmacy Records  vancomycin (VANCOCIN) 125 MG capsule [id number] No Take 125 mg by mouth in the morning and at bedtime. [provider] Past Week Active Self, Pharmacy Records            Home Care and Equipment/Supplies: Were Home Health Services Ordered?: No Any new equipment or medical supplies ordered?: No  Functional Questionnaire: Do you need assistance with bathing/showering or dressing?: No Do you need assistance with meal preparation?: No Do you need assistance with eating?: No Do you have difficulty maintaining continence: No Do you need assistance with getting out of bed/getting out of a chair/moving?: No Do you have difficulty managing or taking your medications?: No  Follow up appointments reviewed: PCP Follow-up appointment confirmed?: Yes Date of PCP follow-up appointment?: 10/07/23 Follow-up Provider: Has PCP follow up appt @ [facility] with [facility] Follow-up appointment confirmed?: Yes Date of Specialist follow-up  appointment?: 10/01/23 Follow-Up Specialty Provider:: Had a F/U appt MD today (11/11) w/ Internal medicine Dr. Loreli Dollar in [place]; and has f/u with Sherron Ales on 11/12 re osteoarthritis Do you need transportation to your follow-up appointment?: No Do you understand care options if your condition(s) worsen?: Yes-patient verbalized understanding   Alyse Low, RN, BA, [facility], CRRN [facility] Care Management Coordinator, Transition of Care Ph # [phone]

## 2023-09-30 NOTE — Anesthesia Postprocedure Evaluation (Signed)
Anesthesia Post Note  Patient: JAMESE COLELLA  Procedure(s) Performed: FLEXIBLE SIGMOIDOSCOPY BIOPSY     Patient location during evaluation: Endoscopy Anesthesia Type: MAC Level of consciousness: awake and alert Pain management: pain level controlled Vital Signs Assessment: post-procedure vital signs reviewed and stable Respiratory status: spontaneous breathing, nonlabored ventilation, respiratory function stable and patient connected to nasal cannula oxygen Cardiovascular status: stable and blood pressure returned to baseline Postop Assessment: no apparent nausea or vomiting Anesthetic complications: no   No notable events documented.  Last Vitals:  Vitals:   09/28/23 0444 09/28/23 0751  BP: 131/80 (!) 145/90  Pulse: 79 79  Resp: 18 17  Temp: 36.7 C 36.6 C  SpO2: 97% 99%    Last Pain:  Vitals:   09/28/23 0900  TempSrc:   PainSc: 0-No pain                 Sultan Pargas S

## 2023-09-30 NOTE — Unmapped (Signed)
 [facility]  CONSULT NOTE - INFLAMMATORY BOWEL DISEASE  09/30/2023    Demographics:  Traci Bennett is a 82 y.o. year old female seen for colitis, via PHONE    Referring physician:   Dr. 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 She has been on a number of therapies including steroids/budesonide (prior hip fracture), vedolizumab (no response), infliximab (response, but developed infectious complication of urosepsis), mesalamine, and most recently stelara without benefit. Her symptoms are predominantly diarrhea, incontinence. She also has a history of c diff infection and has been on vancomycin taper in the past (and uses this with need for other antibiotics).  .    Interval history:  At last visit, she was advised to stop prednisone, continue mesalamine and vancomycin along with the plan to start a S1P modulator. She was to continue Stelara until S1P approved/received. She was also referred for pelvic floor PT locally regarding incontinence.     Since last seen, she had developed rectal pain and swelling that had been present for 1-2 weeks. When she developed fever, she presented to the ER and was admitted for left buttock cellulitis 11/6-07/2023. CT pelvis noted rectal wall thickening, perianal fat stranding extending into the gluteal region and perineum on the left, without abscess or fistula. Flex Sigmoidoscopy done that  only showed internal/external hemorrhoids, normal mucosa, but biopsies consistent with collagenous colitis. She was treated with 10-day course of Linezolid and Augmentin for the perirectal abscess/cellulitis.     She reports currently that the additional antibiotics that she was prescribed has caused an increase in her diarrhea. She is having 4-5 x loose bowel movements daily with urgency. She has had 1 blow out yesterday and was lucky that she was wearing depends. She endorsed some mild bloody drainage from the buttock sore. She continues Asacol, Colestid and vancomycin all twice daily. She did take 2 doses of the Velsipity prior to hospital admission and being advised to hold it. She has only used ~ 2 tabs of Imodium daily at this point. She is using sitz baths soaks to help with healing. She still has left buttock tenderness but is able to sit comfortably and swelling is diminishing.    Review of Systems: positive as per HPI   Otherwise, the balance of 10 systems is negative.    Brief colitis Disease Course:    Diagnosed years' ago with microscopic colitis, chronic entocort use with suboptimal control of diarrhea, osteopenia and fracture after a fall. Trial of entyvio wtihout clinical response. Colonoscopy with chronic colitis changes, thickening on CT, and elevated fecal calpro (387). Diagnosis changed to indeterminate colitis, initiation of remicade monotherapy. Subsequent infectious complications with urosepsis. Remicade discontinued and maintained on mesalamine, with flare was initiated on stelara. Treated for concomitant c diff. Initial response,  then loss of response. Perirectal abscess/buttocks cellulitis.      Endoscopy:        09/25/2023 Flex Sig  Impression: normal findings regarding rectum, sigmoid colon, descending colon and underwent   Biopsy confirming microscopic colitis, only notable for external/internal   hemorrhoids.       02/06/2023 Colonoscopy  Impression:    - Non-thrombosed internal hemorrhoids found on perianal exam.                         -  The entire examined colon is normal. Biopsied from the right and left colon to   rule out inflammation.                          Stool sample aspirated for c. difficile.                         - The examined portion of the ileum was normal.                         - Non-bleeding hemorrhoids.    Pathology:    A. Ascending colon, biopsy:  -Microscopic colitis with features suggestive of collagenous colitis.     B. Descending colon, biopsy:  -Microscopic colitis with features suggestive of collagenous colitis.     10/16/2021 Colonoscopy   Impression:     - Nodular mucosa in the sigmoid colon, in the descending colon, in the   ascending colon and in the cecum. Biopsied.    Upper endoscopy:  Impression:    - Small hiatal hernia.                         - Erythematous mucosa in the prepyloric region of the stomach. Biopsied.                         - Normal examined duodenum.    Pathology:  Stomach, reactive foveolar hyperplasia, and mild chronic   inactive gastritis    Cecum, mildly active colitis with features of chronicity   Colon, no evidence of active colitis or increase in intraepithelial   lymphocytes     04/2019 EGD Barrett's esophagus - esophagus biopsy normal. EGD with possibe short segment barretts and normal appearing duodenum    04/2019 colonoscopy - report shows normal colon, 2 polyps removed (tubular adenomas), normal ileum.     Imaging:      09/25/2023 CT pelvis  Impression: -Rectal wall thickening with perianal fat stranding extending into the gluteal region   and perineum on the left. No abscess or fistula is seen.   -Findings compatible with pelvic floor dysfunction.   -Bilateral nephrolithiasis.   -Uterine fibroids.   -Aortic atherosclerosis.     CT AP W Contrast 06/23/22: ([facility])   1. No acute intra-abdominal or intrapelvic process.   2. Stable bilateral nonobstructing renal calculi.   3. Aortic Atherosclerosis (ICD10-I70.0) and Emphysema (ICD10-J43.9).     07/26/2020 CT  IMPRESSION:     Bilateral nephrolithiasis with question of mild hydronephrosis.     There is subtle wall thickening and inflammatory stranding involving a Terin Cragle segment of large bowel that extends from the cecum, ascending colon, transverse colon and portions of the descending colon near the splenic flexure. This likely reflects an inflammatory/infectious process such as colitis.     There is mild nonspecific circumferential thickening of the urinary bladder. Correlation with urinalysis recommended      Prior medications (type, dose, duration, response):  Entocort - recurrent courses/chronic use  Entyvio - lack of response  Remicade  Stelara - initial response (with steroids), loss of response  Vancomycin (initially for treatment of C diff), then with continued improvement of diarrhea            Past Medical History:   Past medical history:   Past Medical History:   Diagnosis Date  Arthritis     Barrett esophagus     Disease of thyroid gland     GERD (gastroesophageal reflux disease)     Hip fracture (CMS-HCC)     History of hip surgery     Hypercholesteremia     Hypertension     Microscopic colitis      Past surgical history:   Past Surgical History:   Procedure Laterality Date    BACK SURGERY      HIP FRACTURE SURGERY Right     PR COLONOSCOPY W/BIOPSY SINGLE/MULTIPLE N/A 10/16/2021    Procedure: COLONOSCOPY, FLEXIBLE, PROXIMAL TO SPLENIC FLEXURE; WITH BIOPSY, SINGLE OR MULTIPLE;  Surgeon: Vidal Schwalbe, MD;  Location: [facility];  Service: Gastroenterology    PR COLONOSCOPY W/BIOPSY SINGLE/MULTIPLE N/A 02/06/2023    Procedure: COLONOSCOPY, FLEXIBLE, PROXIMAL TO SPLENIC FLEXURE; WITH BIOPSY, SINGLE OR MULTIPLE;  Surgeon: Monte Fantasia, MD;  Location: [facility];  Service: Gastroenterology    PR UPPER GI ENDOSCOPY,BIOPSY N/A 10/16/2021    Procedure: UGI ENDOSCOPY; WITH BIOPSY, SINGLE OR MULTIPLE;  Surgeon: Vidal Schwalbe, MD;  Location: [facility];  Service: Gastroenterology     Family history:   Family History   Problem Relation Age of Onset    Colon cancer Brother     Ulcerative colitis Son      Social history:   Social History     Socioeconomic History    Marital status: Married     Spouse name: None    Number of children: None    Years of education: None    Highest education level: None Tobacco Use    Smoking status: Former    Smokeless tobacco: Never   Vaping Use    Vaping status: Never Used   Substance and Sexual Activity    Alcohol use: Not Currently     Alcohol/week: 1.0 standard drink of alcohol     Types: 1 Glasses of wine per week     Comment: every now and then    Drug use: Never     Social Determinants of Health     Food Insecurity: No Food Insecurity (09/25/2023)    Received from [facility]    Hunger Vital Sign     Worried About Running Out of Food in the Last Year: Never true     Ran Out of Food in the Last Year: Never true   Transportation Needs: No Transportation Needs (09/25/2023)    Received from [facility] - Transportation     Lack of Transportation (Medical): No     Lack of Transportation (Non-Medical): No             Allergies:     Allergies   Allergen Reactions    Bupropion Hives and Other (See Comments)    Montelukast Other (See Comments)     Reaction:  Makes pt jittery    Reaction:  Makes pt jittery      Reaction:  Makes pt jittery Makes pt jittery      Makes pt jittery    Montelukast Sodium Other (See Comments)     Reaction:  Makes pt jittery  Makes pt jittery      Alendronate Sodium Other (See Comments) and Rash     Reaction:  GI upset   GI upset       Clindamycin Rash    Other Rash    Sulfa (Sulfonamide Antibiotics) Rash  Medications:     Current Outpatient Medications   Medication Sig Dispense Refill    acetaminophen (TYLENOL) 500 MG tablet Take 1 tablet (500 mg total) by mouth.      ascorbic acid, vitamin C, (VITAMIN C) 1000 MG tablet Take 1 tablet (1,000 mg total) by mouth.      aspirin (ECOTRIN) 81 MG tablet Take 1 tablet (81 mg total) by mouth daily.      bisoprolol (ZEBETA) 5 MG tablet Take 1 tablet (5 mg total) by mouth daily.      calcium carbonate 1,500 mg (600 mg elem calcium) tablet 1 tab Orally once a day      calcium carbonate-vit D3-min 600 mg calcium- 400 unit Tab Take by mouth.      cetirizine (ZYRTEC) 10 MG tablet Take 1 tablet (10 mg total) by mouth.      cholecalciferol, vitamin D3-50 mcg, 2,000 unit,, 50 mcg (2,000 unit) tablet Take 1 tablet (50 mcg total) by mouth daily.      cinnamon bark 500 mg capsule Take 500 mg by mouth.      coenzyme Q10 200 mg capsule Take 1 capsule (200 mg total) by mouth daily.      colestipol (COLESTID) 1 gram tablet Take 4 tablets (4 g total) by mouth Three (3) times a day. 360 tablet 1    cycloSPORINE (RESTASIS) 0.05 % ophthalmic emulsion Administer 1 drop to both eyes every twelve (12) hours.      denosumab (PROLIA SUBQ) Inject under the skin every six (6) months.      etrasimod (VELSIPITY) 2 mg Tab Take 1 tablet (2 mg) by mouth in the morning. 30 tablet 2    famotidine (PEPCID) 40 MG tablet Take 10 mg by mouth every evening.      flaxseed oil 1,000 mg cap 1 tab Orally Once a day      fluticasone propionate (FLONASE) 50 mcg/actuation nasal spray 2 sprays into each nostril.      Lactobacillus acidophilus 10 billion cell cap Take 1 capsule by mouth daily.      levothyroxine (SYNTHROID) 125 MCG tablet Take 1 tablet (125 mcg total) by mouth daily before breakfast.      linezolid (ZYVOX) 600 mg tablet Take 1 tablet (600 mg total) by mouth two (2) times a day.      loperamide (IMODIUM) 2 mg capsule TAKE 1 CAPSULE BY MOUTH FOUR TIMES DAILY AS NEEDED FOR DIARRHEA 120 capsule 1    loratadine (CLARITIN) 10 mg tablet Take 1 tablet (10 mg total) by mouth daily.      mesalamine (LIALDA) 1.2 gram DR tablet Take 2 tablets (2.4 g total) by mouth two (2) times a day. 120 tablet 5    multivitamin therapeutic with minerals (THERA-M) 27-0.4 mg Tab Take 1 tablet by mouth daily.      multivitamin with minerals (DAILY MULTIVITAMIN-MINERALS) tablet Take by mouth.      omega-3 acid ethyl esters (LOVAZA) 1 gram capsule Take by mouth.      omega-3 fatty acids-fish oil 360-1,200 mg cap Take 1,200 mg by mouth.      pantoprazole (PROTONIX) 40 MG tablet TAKE 1 TABLET BY MOUTH TWICE DAILY BEFORE BREAKFAST AND BEFORE DINNER 180 tablet 3 simvastatin (ZOCOR) 20 MG tablet Take 1 tablet (20 mg total) by mouth daily before breakfast.      vancomycin (VANCOCIN) 125 MG capsule Take 1 capsule (125 mg total) by mouth two (2) times a day. 60 capsule 0  No current facility-administered medications for this visit.             Physical Exam:      BP 147/74  - Pulse 70  - Temp 36.6 ??C (97.8 ??F)  - Wt 50 kg (110 lb 4.8 oz)  - BMI 18.93 kg/m??     Wt Readings from Last 6 Encounters:   09/30/23 50 kg (110 lb 4.8 oz)   02/06/23 49.9 kg (110 lb)   01/15/23 49.7 kg (109 lb 9.6 oz)   07/17/22 48.1 kg (106 lb)   05/08/22 48.1 kg (106 lb)   12/26/21 49.4 kg (109 lb)      Constitutional:    Alert, oriented x 3, no acute distress, well nourished, and well hydrated.   Mental Status:    Thought organized, appropriate affect, pleasantly interactive, not anxious appearing.   HEENT:    conjunctiva clear, anicteric   Respiratory: Unlabored breathing.      Cardiac: Euvolemic, regular rate.      Abdomen: Soft, non-distended, tenderness mid upper and right lower quadrants, no organomegaly or masses.      Perianal/Rectal Exam Indurated anterior perirectal abscess left buttock, 1 x 1 cm opening with pus present.      Extremities:    No edema, well perfused.                 Labs, Data & Indices:     Lab Review:   Lab Results   Component Value Date    WBC 8.9 07/30/2023    RBC 4.04 07/30/2023    HGB 13.3 07/30/2023     Lab Results   Component Value Date    AST 24 07/30/2023    ALT 24 07/30/2023    BUN 13 07/30/2023    Creatinine Whole Blood, POC 0.7 09/07/2020    Creatinine 0.98 07/30/2023    CO2 23 07/30/2023    Albumin 4.9 05/11/2021    Calcium 9.4 07/30/2023     No results found for: TSH   ............................................................................................................................................Marland Kitchen          Assessment & Recommendations:     Traci Bennett is a 82 y.o. female who presents for follow up of indeterminate colitis (with colon thickening on CT and now chronic colitis changes on biopsy), lack of response to entyvio, exacerbation of symptoms with elevated fecal calpro, and no benefit on vedo transitioned to remicade monotherapy and did have clinical response. After infusion 06/22/22 patient with hospital admission for urosepsis. Discontinued remicade due to infectious complication, and maintained on mesalamine. Flare, initiated on stelara and concomitant c diff treated with vanco. Initial response, then exacerbation of symptoms. Delayed Velsipity start due to insurance but perirectal abscess symptoms were present prior to start. We will continue to hold S1P for now, continue Asacol, Colestipol and Vancomycin and proceed with MRI pelvis and colorectal consultation for possible drain.         PLAN:    Indeterminate colitis/microscopic colitis -  will hold Velsipity (with indurated abscess) and continue the mesalamine, Colestid and vancomycin. (As well as her antibiotics for the perirectal abscess) Recent flex sign exam biopsy confirmed microscopic colitis and only notable for external/internal hemorrhoids. Calprotectin after resumption of Velsipity to assess for biologic remission on current therapy ordered.   Perirectal abscess - continue Augmentin and Linezolid as prescribed along with sitz salt water baths. Obtain MRI pelvis to coordinate with appt with Dr. Neysa Hotter, colorectal surgery  Incontinence - will hold on pelvic floor PT at this  time  Reflux - will cont PPI BID, elevated head of bed, H2 blocker (pepcid) at night  Recommend updating Tdap vaccine  Return in 3 months to assess response of vancomycin and S1P therapy             Patient seen and discussed with Dr. Jacqulyn Bath.         Victorino Sparrow  IBD Nurse Practitioner    I saw the patient as part of a medically necessary shared visit with the APP. I personally performed the substantive portion of this visit which included Time/MDM: 45 minutes spent face-to-face and non-face-to-face in the care of this patient, which includes all pre, intra, and post visit time on the date of service which was specific to E/M and does not include any procedures that may have been performed.                Adrian Specht D. Ingri Diemer MD, MPH  Professor of Medicine  [place] [facility] [place] [facility]  Division of Gastroenterology and Hepatology        --------------------------------------------  [facility] Gastroenterology & Hepatology  Multidisciplinary Inflammatory Bowel Disease Clinic

## 2023-09-30 NOTE — Unmapped (Addendum)
Continue holding the Velsipity until the cellulitis heals  Continue Asacol 2 tabs and Colestipol 2 tabs twice daily  Continue Vancomycin 125 mg twice daily  Can increase Imodium up to 2 tabs four times daily  Recommend MRI pelvis to further evaluate  Meet with Dr. Neysa Hotter on the day of MRI to have colorectal consultation  Continue Sitz salt water baths and pat dry area  Will send home with stool kit for repeat Calprotectin level to be turned in  Prevention: Recommend Tdap vaccine  Return 3 months or sooner if needed

## 2023-10-01 ENCOUNTER — Encounter: Payer: Self-pay | Admitting: Physician Assistant

## 2023-10-01 ENCOUNTER — Ambulatory Visit: Payer: Medicare Other | Attending: Physician Assistant | Admitting: Physician Assistant

## 2023-10-01 VITALS — BP 120/72 | HR 76 | Resp 16 | Ht 64.0 in | Wt 110.0 lb

## 2023-10-01 DIAGNOSIS — R2681 Unsteadiness on feet: Secondary | ICD-10-CM | POA: Insufficient documentation

## 2023-10-01 DIAGNOSIS — I10 Essential (primary) hypertension: Secondary | ICD-10-CM | POA: Insufficient documentation

## 2023-10-01 DIAGNOSIS — M19041 Primary osteoarthritis, right hand: Secondary | ICD-10-CM | POA: Insufficient documentation

## 2023-10-01 DIAGNOSIS — K52838 Other microscopic colitis: Secondary | ICD-10-CM | POA: Insufficient documentation

## 2023-10-01 DIAGNOSIS — Z8709 Personal history of other diseases of the respiratory system: Secondary | ICD-10-CM | POA: Diagnosis not present

## 2023-10-01 DIAGNOSIS — M19072 Primary osteoarthritis, left ankle and foot: Secondary | ICD-10-CM | POA: Diagnosis not present

## 2023-10-01 DIAGNOSIS — M19042 Primary osteoarthritis, left hand: Secondary | ICD-10-CM | POA: Diagnosis not present

## 2023-10-01 DIAGNOSIS — A498 Other bacterial infections of unspecified site: Secondary | ICD-10-CM | POA: Insufficient documentation

## 2023-10-01 DIAGNOSIS — R778 Other specified abnormalities of plasma proteins: Secondary | ICD-10-CM | POA: Insufficient documentation

## 2023-10-01 DIAGNOSIS — M19071 Primary osteoarthritis, right ankle and foot: Secondary | ICD-10-CM | POA: Insufficient documentation

## 2023-10-01 DIAGNOSIS — E559 Vitamin D deficiency, unspecified: Secondary | ICD-10-CM | POA: Diagnosis not present

## 2023-10-01 DIAGNOSIS — M5030 Other cervical disc degeneration, unspecified cervical region: Secondary | ICD-10-CM | POA: Diagnosis not present

## 2023-10-01 DIAGNOSIS — R768 Other specified abnormal immunological findings in serum: Secondary | ICD-10-CM | POA: Insufficient documentation

## 2023-10-01 DIAGNOSIS — Z96641 Presence of right artificial hip joint: Secondary | ICD-10-CM | POA: Insufficient documentation

## 2023-10-01 DIAGNOSIS — Z8719 Personal history of other diseases of the digestive system: Secondary | ICD-10-CM | POA: Diagnosis not present

## 2023-10-01 DIAGNOSIS — M810 Age-related osteoporosis without current pathological fracture: Secondary | ICD-10-CM | POA: Insufficient documentation

## 2023-10-01 DIAGNOSIS — Z8639 Personal history of other endocrine, nutritional and metabolic disease: Secondary | ICD-10-CM | POA: Diagnosis not present

## 2023-10-01 DIAGNOSIS — M4316 Spondylolisthesis, lumbar region: Secondary | ICD-10-CM | POA: Diagnosis not present

## 2023-10-06 ENCOUNTER — Ambulatory Visit: Admit: 2023-10-06 | Discharge: 2023-10-07 | Payer: MEDICARE

## 2023-10-06 DIAGNOSIS — K611 Rectal abscess: Secondary | ICD-10-CM | POA: Diagnosis not present

## 2023-10-06 DIAGNOSIS — L03317 Cellulitis of buttock: Secondary | ICD-10-CM | POA: Diagnosis not present

## 2023-10-06 DIAGNOSIS — R609 Edema, unspecified: Secondary | ICD-10-CM | POA: Diagnosis not present

## 2023-10-06 MED ADMIN — gadopiclenol injection 5 mL: .1 mL/kg | INTRAVENOUS | @ 14:00:00 | Stop: 2023-10-06

## 2023-10-07 ENCOUNTER — Ambulatory Visit: Admit: 2023-10-07 | Discharge: 2023-10-08 | Payer: MEDICARE

## 2023-10-07 ENCOUNTER — Inpatient Hospital Stay: Payer: Medicare Other | Admitting: Nurse Practitioner

## 2023-10-07 DIAGNOSIS — K611 Rectal abscess: Principal | ICD-10-CM

## 2023-10-07 DIAGNOSIS — L03317 Cellulitis of buttock: Principal | ICD-10-CM

## 2023-10-07 NOTE — Unmapped (Signed)
[facility] Colorectal Surgery  Outpatient Clinic Note     Patient Name: Traci Bennett   MRN #: [id number]   Date of Service: October 07, 2023    Admitting Physician: Estelle June, *     Chief Complaint: Perirectal abscess      Reason for Visit   Follow up from hospitalization     Assessment   Traci Bennett is a 82 y.o. female with PMH of indeterminate colitis and left buttock cellulitis c/b perirectal abscess who presents for  post-hospitalization follow-up. Overall patient status has improved after completion of 10 day abx course. No constitutional symptoms to note and pain has subsided in anorectal region. On exam she has a well healing external opening and the induration that was present appears to subside.  I believe she likely had a cryoptoglandular abscess which led to her initial presentation.  As this was her first episode and her symptoms have subsided, as well as having no ongoing drainage.  I would not pursue any operative intervention in the immediate setting and we will hope that the initial cryptoglandular abscess/fistula that led to this closes down.  We discussed the potential for persistent symptoms and chronic fistula following anorectal abscess.  She will return to clinic in 2 weeks for evaluation    Plan   Follow up in 2 weeks  Plan to start velsipity per Dr. Jacqulyn Bath (GI specialist) pending that visit         Subjective   HPI: Traci Bennett is a 83 y.o. female with PMH of indeterminate colitis (also with a component of collagenous colitis) presenting to clinic for follow-up of hospitalization perirectal abscess.In short she was hospitalized for this in early 10/26/2023 for  3 days after presenting with gluteal cellulitis.  At the time the lesion opened and drained and she was managed with IV antibiotics and then treated with a 10-day course of Linezolid and Augmentin. Today, she presents to clinic with her sister Darel Hong for follow up. She reports overall improvement in her symptoms and denies fever, chills, or muscle aches. She denies rectal pain when sitting or ambulating, but minor tenderness and edema persist. This is greatly improved. Denies hematochezia or hematuria. Diarrhea and fecal incontinence persists with 4-5 loose stools daily and excessive flatus iso indeterminate colitis. She does not endorse any acute changes in urinary continence. She feels nauseous intermittently d/t reflux w/o episodes of vomiting. Appetite has decreased since hospitalization, primarily eating bland foods and Ensure.  She denies any systemic symptoms such as fever.    Surgical Hx:  Back surgery  Right Hip Fracture Surgery iso fall  Colonoscopy with Biopsy 10-25-21)    OB Hx:  [id number]  Two vaginal births     FH Hx:  Sister Darel Hong): Ulcerative colitis s/p colectomy  Brother: colorectal cancer (passed away 10/25/2021)  Son: ulcerative colitis     SH Hx:  Smoking: 4-5 years smoking 0.5 ppd, 30-40 years since last use, occasional glass of wine. No illicit drug use.        Review of Systems: A 12 system review of systems was negative except as noted in the HPI.    Allergies   Allergen Reactions    Bupropion Hives and Other (See Comments)    Infliximab Other (See Comments)     Unknown    Montelukast Other (See Comments)     Reaction:  Makes pt jittery    Reaction:  Makes pt jittery      Reaction:  Makes  pt jittery Makes pt jittery      Makes pt jittery    Montelukast Sodium Other (See Comments)     Reaction:  Makes pt jittery  Makes pt jittery      Alendronate Sodium Other (See Comments) and Rash     Reaction:  GI upset   GI upset       Clindamycin Rash    Other Rash    Sulfa (Sulfonamide Antibiotics) Rash     Past Medical History:   Diagnosis Date    Arthritis     Barrett esophagus     Disease of thyroid gland     GERD (gastroesophageal reflux disease)     Hip fracture (CMS-HCC)     History of hip surgery     Hypercholesteremia     Hypertension     Microscopic colitis      Past Surgical History:   Procedure Laterality Date BACK SURGERY      HIP FRACTURE SURGERY Right     PR COLONOSCOPY W/BIOPSY SINGLE/MULTIPLE N/A 10/16/2021    Procedure: COLONOSCOPY, FLEXIBLE, PROXIMAL TO SPLENIC FLEXURE; WITH BIOPSY, SINGLE OR MULTIPLE;  Surgeon: Vidal Schwalbe, MD;  Location: [facility];  Service: Gastroenterology    PR COLONOSCOPY W/BIOPSY SINGLE/MULTIPLE N/A 02/06/2023    Procedure: COLONOSCOPY, FLEXIBLE, PROXIMAL TO SPLENIC FLEXURE; WITH BIOPSY, SINGLE OR MULTIPLE;  Surgeon: Monte Fantasia, MD;  Location: [facility];  Service: Gastroenterology    PR UPPER GI ENDOSCOPY,BIOPSY N/A 10/16/2021    Procedure: UGI ENDOSCOPY; WITH BIOPSY, SINGLE OR MULTIPLE;  Surgeon: Vidal Schwalbe, MD;  Location: [facility] PROCEDURES [facility];  Service: Gastroenterology     Current Outpatient Medications   Medication Sig Dispense Refill    acetaminophen (TYLENOL) 500 MG tablet Take 1 tablet (500 mg total) by mouth.      ascorbic acid, vitamin C, (VITAMIN C) 1000 MG tablet Take 1 tablet (1,000 mg total) by mouth.      aspirin (ECOTRIN) 81 MG tablet Take 1 tablet (81 mg total) by mouth daily.      bisoprolol (ZEBETA) 5 MG tablet Take 1 tablet (5 mg total) by mouth daily.      calcium carbonate 1,500 mg (600 mg elem calcium) tablet 1 tab Orally once a day      calcium carbonate-vit D3-min 600 mg calcium- 400 unit Tab Take by mouth.      cetirizine (ZYRTEC) 10 MG tablet Take 1 tablet (10 mg total) by mouth.      cholecalciferol, vitamin D3-50 mcg, 2,000 unit,, 50 mcg (2,000 unit) tablet Take 1 tablet (50 mcg total) by mouth daily.      cinnamon bark 500 mg capsule Take 500 mg by mouth.      clotrimazole-betamethasone (LOTRISONE) 1-0.05 % cream Apply 1 Application topically Two (2) times a day (at 8am and 12:00).      coenzyme Q10 200 mg capsule Take 1 capsule (200 mg total) by mouth daily.      colestipol (COLESTID) 1 gram tablet Take 4 tablets (4 g total) by mouth Three (3) times a day. 360 tablet 1    cycloSPORINE (RESTASIS) 0.05 % ophthalmic emulsion Administer 1 drop to both eyes every twelve (12) hours.      denosumab (PROLIA SUBQ) Inject under the skin every six (6) months.      etrasimod (VELSIPITY) 2 mg Tab Take 1 tablet (2 mg) by mouth in the morning. 30 tablet 2    famotidine (PEPCID) 40  MG tablet Take 10 mg by mouth every evening.      flaxseed oil 1,000 mg cap 1 tab Orally Once a day      fluticasone propionate (FLONASE) 50 mcg/actuation nasal spray 2 sprays into each nostril.      Lactobacillus acidophilus 10 billion cell cap Take 1 capsule by mouth daily.      levothyroxine (SYNTHROID) 125 MCG tablet Take 1 tablet (125 mcg total) by mouth daily before breakfast.      linezolid (ZYVOX) 600 mg tablet Take 1 tablet (600 mg total) by mouth two (2) times a day.      loperamide (IMODIUM) 2 mg capsule TAKE 1 CAPSULE BY MOUTH FOUR TIMES DAILY AS NEEDED FOR DIARRHEA 120 capsule 1    loratadine (CLARITIN) 10 mg tablet Take 1 tablet (10 mg total) by mouth daily.      meclizine (ANTIVERT) 25 mg tablet 1/2-1 pill up to 3 times daily for motion sickness/dizziness      mesalamine (LIALDA) 1.2 gram DR tablet Take 2 tablets (2.4 g total) by mouth two (2) times a day. 120 tablet 5    multivitamin therapeutic with minerals (THERA-M) 27-0.4 mg Tab Take 1 tablet by mouth daily.      multivitamin with minerals (DAILY MULTIVITAMIN-MINERALS) tablet Take by mouth.      omega-3 acid ethyl esters (LOVAZA) 1 gram capsule Take by mouth.      omega-3 fatty acids-fish oil 360-1,200 mg cap Take 1,200 mg by mouth.      pantoprazole (PROTONIX) 40 MG tablet TAKE 1 TABLET BY MOUTH TWICE DAILY BEFORE BREAKFAST AND BEFORE DINNER 180 tablet 3    simvastatin (ZOCOR) 20 MG tablet Take 1 tablet (20 mg total) by mouth daily before breakfast.      triamcinolone (KENALOG) 0.5 % ointment Apply 1 Application topically Two (2) times a day (at 8am and 12:00).      vancomycin (VANCOCIN) 125 MG capsule Take 1 capsule (125 mg total) by mouth two (2) times a day. 60 capsule 0     No current facility-administered medications for this visit.     Family History   Problem Relation Age of Onset    Colon cancer Brother     Ulcerative colitis Son      Social History     Socioeconomic History    Marital status: Married     Spouse name: None    Number of children: None    Years of education: None    Highest education level: None   Tobacco Use    Smoking status: Former    Smokeless tobacco: Never   Advertising account planner    Vaping status: Never Used   Substance and Sexual Activity    Alcohol use: Not Currently     Alcohol/week: 1.0 standard drink of alcohol     Types: 1 Glasses of wine per week     Comment: every now and then    Drug use: Never     Social Drivers of Health     Food Insecurity: No Food Insecurity (09/30/2023)    Received [facility]    Hunger Vital Sign     Worried About Running Out of Food in the Last Year: Never true     Ran Out of Food in the Last Year: Never true   Transportation Needs: No Transportation Needs (09/30/2023)    Received from [facility]     Lack of Transportation (Medical): No  Lack of Transportation (Non-Medical): No     Objective   Vitals Signs:  BP 140/69 (BP Position: Sitting)  - Pulse 69  - Temp 36.4 ??C (97.5 ??F) (Temporal)  - Ht 162.6 cm (5' 4)  - Wt 48.6 kg (107 lb 1.6 oz)  - BMI 18.38 kg/m??     Wt Readings from Last 5 Encounters:   10/07/23 48.6 kg (107 lb 1.6 oz)   09/30/23 50 kg (110 lb 4.8 oz)   02/06/23 49.9 kg (110 lb)   01/15/23 49.7 kg (109 lb 9.6 oz)   07/17/22 48.1 kg (106 lb)      Physical Exam:  General: alert, well appearing, in no acute distress  Cardiovascular: regular rate, normotensive  Pulmonary: breathing comfortably on room air and lungs are clear to auscultation bilaterally  Abdominal: thin, soft, nondistended, and diffusely tender  Rectal exam: left-sided prior abscess opening well healing with no appreciable induration.  She is non-tender, Anal canal with no inflammation or edema on anoscopy. Extremities: warm and well perfused bilaterally, no edema  Skin: skin color, texture, and turgor are normal. No visible rashes or suspicious lesions  Musculoskeletal: no joint tenderness, deformity, swelling, or muscular tenderness noted. Full range of motion without pain  Neurologic: AAOx3. No visible tremor. Grossly intact neurologically  Psychiatric: alert and oriented to person, place, and time. Judgement and insight are appropriate

## 2023-10-16 NOTE — Unmapped (Signed)
Traci Bennett has been contacted in regards to their refill of Velsipity. At this time, they have declined refill due to medication being on hold. Refill assessment call date has been updated per the patient's request.

## 2023-10-21 ENCOUNTER — Ambulatory Visit: Admit: 2023-10-21 | Discharge: 2023-10-22 | Payer: MEDICARE

## 2023-10-21 DIAGNOSIS — K611 Rectal abscess: Principal | ICD-10-CM

## 2023-10-21 DIAGNOSIS — L03317 Cellulitis of buttock: Principal | ICD-10-CM

## 2023-10-21 NOTE — Unmapped (Signed)
Colorectal Surgery Clinic Note    Today's Date: 10/21/2023    Subjective  Traci Bennett is a 82 y.o. female  with PMH of collagenous colitis and left buttock cellulitis c/b perirectal abscess who presents for two week follow-up. She presented after he abscess had self-drained and it was improving.  She presents today for 2 week follow-up.  She is greatly improved since.  She has no induration or swelling any more.  She has no bleeding.  She has no purulent drainage.  She is tolerating a regular diet.  She has no infectious symptoms or signs.    Patient endorses diarrhea 2/2 collagenous colitis 6-7x daily, followed by Dr. Jacqulyn Bath (deferred Velsipity iso of perirectal abscess).      Objective  BP 143/83 (BP Position: Sitting, BP Cuff Size: Small)  - Pulse 66  - Temp 36.9 ??C (98.4 ??F) (Temporal)  - Ht 165.1 cm (5' 5)  - Wt 49.3 kg (108 lb 9.6 oz)  - BMI 18.07 kg/m??      Gen: alert, well appearing, in no acute distress  CV: regular rate, normotensive  Pulm: breathing comfortably on room air  Abd: soft, nondistended, and nontender to palpation.  Ext: warm and well perfused bilaterally, no edema  Rectal exam: appreciated well-healing small opening to abscess without evidence of erythema, edema. No TTP noted.       Plan  - Applied silver nitrate to the base of wound   - Discontinue clotrimazole-betamethasone (Lotrisone) cream she was applying as this may impair healing.  - OK to Start Velsipity per Gastroenterology (followed by Dr. Loreli Dollar)    Spero Curb, MD  Colorectal Surgery

## 2023-10-22 ENCOUNTER — Ambulatory Visit (INDEPENDENT_AMBULATORY_CARE_PROVIDER_SITE_OTHER): Payer: Medicare Other | Admitting: Neurology

## 2023-10-22 ENCOUNTER — Encounter: Payer: Self-pay | Admitting: Neurology

## 2023-10-22 VITALS — BP 130/77 | HR 78 | Ht 64.0 in | Wt 109.0 lb

## 2023-10-22 DIAGNOSIS — G629 Polyneuropathy, unspecified: Secondary | ICD-10-CM

## 2023-10-22 DIAGNOSIS — R519 Headache, unspecified: Secondary | ICD-10-CM

## 2023-10-22 NOTE — Patient Instructions (Signed)
MRI brain   Referral to physical therapy for balance   Recommend using a cane on uneven ground

## 2023-10-22 NOTE — Progress Notes (Signed)
Follow-up Visit   Date: 10/22/2023    Debbie Houston MRN: [id number] DOB: [birth year]    Debbie Houston is a 82 y.o. left-handed Caucasian female with ulcerative colitis, hyperlipidemia, GERD, asthma, hypothyroidism, OA, cervical spondylosis, lumbar spondylolisthesis at L4-5 (followed by Dr. Danielle Dess)  returning to the clinic for follow-up of bilateral hand and feet pain/paresthesias.  The patient was accompanied to the clinic by self.  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 CSF testing for polyradiculoneuropathy returned normal.  Symptoms are predominately numbness, therefore no role for medications.  She has previously tried gabapentin and cymbalta without benefit. I offered second opinion at an academic center, which she declined.  For her imbalance, referral will be placed for out-patient PT. New onset headaches.  No worrisome findings on exam, but with her facial numbness, I will check MRI brain wo contrast.   Return to clinic in 6 months   --------------------------------------------- History of present illness: She received Remicade infusion for Crohn's colitis in July and following this developed UTI.  She was hospitalized in August for urosepsis.  While hospitalized, she began having numbness, tingling, and sharp shooting pain over the hands and feet.  Numbness is constant and she has episodic sharp pain.  She tried gabapentin 200mg  but stopped this due to dizziness.   She complains of weakness with difficulty with opening jars/grip.  She has some imbalance, walks unassisted and does not use a cane. Over the past three months, symptoms remain unchanged.  She denies having any numbness/tingling prior to her hospitalization.    She lives at home with husband and high-functioning autistic son.   UPDATE 10/31/2022:  She is here for follow-up and discuss results of EMG and  labs.  There has been no change in the numbness of her hands. She continues to have difficulty with holding onto objects and frequently drops them.  She also endorse imbalance and had one fall which results in bruise over her right eye.  Tingling has improved slightly on Cymbalta 30mg  and she would like to consider increasing the dose. No new complaints.    UPDATE 12/19/2022:  She is here for follow-up. CSF testing returned normal without signs of inflammation.  She continues have numbness of the hands and reports droping things frequently.   She denies significant tingling or burning of the hands.  She is tolerating Cymbalta 30mg  twice daily.  Over the past few weeks, her colitis has been giving her more problems and she is currently on a steroid taper.   UPDATE 10/22/2023:  She is here for follow-up visit.  She feels that the numbness in the hands are getting worse and fine motor tasks such as writing and putting her hearing aides is difficult.  She stopped Cymbalta at the recommendation of GI due to issues with colitis. She denies significant pain.   She also complains of right sided dull headache and numbness, which is new.  Headaches are not worse with coughing, sneezing, or bending.   Medications:  Current Outpatient Medications on File Prior to Visit  Medication Sig Dispense Refill   COLESTIPOL HCL PO Take 2 tablets by mouth in the morning and at bedtime.     cycloSPORINE (RESTASIS) 0.05 % ophthalmic emulsion Place 2 drops into both eyes 2 (two) times daily.     Etrasimod Arginine (VELSIPITY) 2 MG TABS Take 2 mg by mouth daily. (Patient taking differently: Take 2 mg by  mouth daily. On Hold)     Fluticasone Propionate (FLONASE NA) Place 1 spray into the nose as needed (allergies).     levothyroxine (SYNTHROID) 88 MCG tablet Take 88 mcg by mouth daily before breakfast.     loperamide (IMODIUM) 2 MG capsule Take 2 mg by mouth 2 (two) times daily as needed for diarrhea or loose stools.      meclizine (ANTIVERT) 25 MG tablet 1/2-1 pill up to 3 times daily for motion sickness/dizziness (Patient taking differently: Take 25 mg by mouth as needed for dizziness or nausea.) 30 tablet 0   mesalamine (LIALDA) 1.2 g EC tablet Take 2.4 g by mouth in the morning and at bedtime.     Multiple Vitamin (THERA VITAMIN PO) Take 1 tablet by mouth daily.     Omega-3 Fatty Acids (FISH OIL) 1200 MG CAPS Take 1,200 mg by mouth daily.      pantoprazole (PROTONIX) 40 MG tablet Take 1 tablet  2 x /day  for Heartburn & Indigestion (Patient taking differently: Take 40 mg by mouth 2 (two) times daily.) 180 tablet 0   Probiotic Product (PROBIOTIC PO) Take 1 capsule by mouth daily.     PROLIA 60 MG/ML SOSY injection Inject 60 mg into the skin every 6 (six) months.     pseudoephedrine (SUDAFED) 120 MG 12 hr tablet Take  1 tablet  2 x /day (every 12 hours)  for Head and Chest Congestion (Patient taking differently: Take 120 mg by mouth as needed for congestion.) 60 tablet 0   simvastatin (ZOCOR) 20 MG tablet Take 1 tablet at Bedtime for Cholesterol 90 tablet 3   vancomycin (VANCOCIN) 125 MG capsule Take 125 mg by mouth in the morning and at bedtime.     acetaminophen (TYLENOL) 500 MG tablet Take 500 mg by mouth as needed for moderate pain (pain score 4-6) or headache.     Ascorbic Acid (VITAMIN C) 1000 MG tablet Take 1,000 mg by mouth daily.      aspirin EC 81 MG tablet Take 1 tablet (81 mg total) by mouth in the morning and at bedtime. (Patient taking differently: Take 81 mg by mouth at bedtime.) 60 tablet 0   bisoprolol (ZEBETA) 5 MG tablet Take 1 tablet every Morning for BP (Patient taking differently: Take 5 mg by mouth at bedtime.) 90 tablet 3   Calcium Carbonate-Vit D-Min (CALCIUM 1200 PO) Take 1,200 mg by mouth daily.     cetirizine (ZYRTEC ALLERGY) 10 MG tablet Take 10 mg by mouth daily as needed for allergies.     CINNAMON PO Take 2,000 mg by mouth daily.     clotrimazole-betamethasone (LOTRISONE) cream Apply  1 Application topically 2 (two) times daily. (Patient not taking: Reported on 10/22/2023) 15 g 2   Coenzyme Q10 (COQ10) 200 MG CAPS Take 200 mg by mouth daily.     triamcinolone ointment (KENALOG) 0.5 % Apply 1 Application topically 2 (two) times daily. (Patient not taking: Reported on 10/22/2023) 30 g 0   No current facility-administered medications on file prior to visit.    Allergies:  Allergies  Allergen Reactions   Remicade [Infliximab] Other (See Comments)    Unknown    Fosamax [Alendronate Sodium] Other (See Comments)    GI upset    Montelukast Other (See Comments)    Reaction:  Makes pt jittery    Reaction:  Makes pt jittery  Reaction:  Makes pt jittery Makes pt jittery  Makes pt jittery   Singulair [Montelukast Sodium]  Other (See Comments)    Makes pt jittery   Wellbutrin [Bupropion] Hives   Clindamycin/Lincomycin Rash   Sulfa Antibiotics Rash    Vital Signs:  BP 130/77   Pulse 78   Ht 5\' 4"  (1.626 m)   Wt 109 lb (49.4 kg)   SpO2 99%   BMI 18.71 kg/m   Neurological Exam: MENTAL STATUS including orientation to time, place, person, recent and remote memory, attention span and concentration, language, and fund of knowledge is normal.  Speech is not dysarthric.  CRANIAL NERVES:   Pupils equal round and reactive to light.  Normal conjugate, extra-ocular eye movements in all directions of gaze.  No ptosis .  Face is symmetric.   MOTOR:  Motor strength is 5/5 in all extremities, except 4/5 intrinsic hand muscles.  Generalized loss of muscle bulk with bilateral ABP atrophy, fasciculations or abnormal movements.  No pronator drift.  Tone is normal.    MSRs:  Reflexes are 2+/4 throughout, except absent at the ankles.  SENSORY:  Intact to vibration throughout, except trace at the ankles bilaterally.  Rhomberg testing is mildly positive.   COORDINATION/GAIT:   Gait wide based and slightly unstable, unassisted  Data: Labs 10/22/2022:  CRP < 1.0, ESR 19, folate > 23, copper  108, SPEP with IFE poorly defined IgG and kappa  Lab Results  Component Value Date   VITAMINB12 681 07/10/2022    NCS/EMG of the arms 10/09/2022: This is a complex study.  Findings are most suggestive of a non-length dependent and chronic sensorimotor polyneuropathy, with axonal and demyelinating features. Alternatively, a polyneuropathy with superimposed median and ulnar entrapment neuropathies in the upper extremity is possible.  Nerve ultrasound recommended to further evaluate. Chronic L5 radiculopathy affecting the right lower extremity, mild.  CSF 11/23/2022:  R2 W0 G74 P46  OCB absent, MBP < 2.0, Lyme neg, ACE neg, cytology neg   Thank you for allowing me to participate in patient's care.  If I can answer any additional questions, I would be pleased to do so.    Sincerely,    Maziah Smola K. Allena Katz, DO

## 2023-10-23 DIAGNOSIS — K52839 Microscopic colitis, unspecified: Secondary | ICD-10-CM | POA: Diagnosis not present

## 2023-10-24 DIAGNOSIS — K52839 Microscopic colitis, unspecified: Principal | ICD-10-CM

## 2023-10-24 MED ORDER — VANCOMYCIN 125 MG CAPSULE
ORAL_CAPSULE | 0 refills | 0 days
Start: 2023-10-24 — End: ?

## 2023-10-25 MED ORDER — VANCOMYCIN 125 MG CAPSULE
ORAL_CAPSULE | 1 refills | 0 days | Status: CP
Start: 2023-10-25 — End: ?

## 2023-10-25 NOTE — Unmapped (Signed)
Patient is requesting the following refill  Requested Prescriptions     Pending Prescriptions Disp Refills    vancomycin (VANCOCIN) 125 MG capsule [Pharmacy Med Name: VANCOMYCIN 125MG  CAPSULES] 60 capsule 0     Sig: TAKE 1 CAPSULE(125 MG) BY MOUTH TWICE DAILY       Recent Visits  Date Type Provider Dept   10/21/23 Office Visit Stem, Jossie Ng, MD [facility] Multispecialty Surgery [facility]   10/07/23 Office Visit Stem, Jossie Ng, MD [facility] Multispecialty [facility]   09/30/23 Office Visit Long, Ozella Almond, MD [facility] [place]   07/29/23 Telemedicine Long, Ozella Almond, MD [facility] [place]   12/18/22 Telemedicine Long, Ozella Almond, MD [facility]   Showing recent visits within past 365 days and meeting all other requirements  Future Appointments  Date Type Provider Dept   10/31/23 Appointment Jackquline Berlin, CPP [facility]   12/10/23 Appointment Long, Ozella Almond, MD [facility]   Showing future appointments within next 365 days and meeting all other requirements           Encounter for refill request:    Colonoscopy:   Appointments which have been scheduled for you      Oct 31, 2023 11:00 AM  (Arrive by 10:45 AM)  NEW PHARMD with Payal Terence Lux, CPP  [facility] GI MEDICINE [place] [facility] REGION) [address]  [facility] [address]  [place] [address]  [phone]        Dec 10, 2023 11:00 AM  (Arrive by 10:45 AM)  RETURN IBD with Monte Fantasia, MD  [facility] GI MEDICINE [place] [facility] REGION) [address]  [facility] [address]  [place] [address]  [phone]             Lab Results   Component Value Date    WBC 8.9 07/30/2023    RBC 4.04 07/30/2023    HGB 13.3 07/30/2023    HCT 40.0 07/30/2023    PLT 219 07/30/2023    ALT 24 07/30/2023    AST 24 07/30/2023    ALKPHOS 57 07/30/2023    CRP <1 07/30/2023    CREATININE 0.98 07/30/2023       refills authorized x  2

## 2023-10-26 DIAGNOSIS — K52839 Microscopic colitis, unspecified: Principal | ICD-10-CM

## 2023-10-26 MED ORDER — VANCOMYCIN 125 MG CAPSULE
ORAL_CAPSULE | Freq: Four times a day (QID) | ORAL | 0 refills | 0 days
Start: 2023-10-26 — End: ?

## 2023-10-28 MED ORDER — VANCOMYCIN 125 MG CAPSULE
ORAL_CAPSULE | Freq: Four times a day (QID) | ORAL | 2 refills | 30.00 days | Status: CP
Start: 2023-10-28 — End: ?

## 2023-10-28 NOTE — Unmapped (Signed)
Patient is requesting the following refill  Requested Prescriptions     Pending Prescriptions Disp Refills    vancomycin (VANCOCIN) 125 MG capsule [Pharmacy Med Name: VANCOMYCIN 125MG  CAPSULES] 120 capsule 0     Sig: TAKE ONE CAPSULE BY MOUTH FOUR TIMES DAILY.       Recent Visits  Date Type Provider Dept   10/21/23 Office Visit Stem, Jossie Ng, MD [facility]   10/07/23 Office Visit Stem, Jossie Ng, MD [facility] [place]   09/30/23 Office Visit Long, Ozella Almond, MD [facility] [place]   07/29/23 Telemedicine Long, Ozella Almond, MD [facility] [place]   12/18/22 Telemedicine Long, Ozella Almond, MD [facility]   Showing recent visits within past 365 days and meeting all other requirements  Future Appointments  Date Type Provider Dept   10/31/23 Appointment Jackquline Berlin, CPP [facility]   12/10/23 Appointment Long, Ozella Almond, MD [facility]   Showing future appointments within next 365 days and meeting all other requirements        Lab Results   Component Value Date    WBC 8.9 07/30/2023    RBC 4.04 07/30/2023    HGB 13.3 07/30/2023    HCT 40.0 07/30/2023    PLT 219 07/30/2023    ALT 24 07/30/2023    AST 24 07/30/2023    ALKPHOS 57 07/30/2023    CRP <1 07/30/2023    CREATININE 0.98 07/30/2023     Refills authorized x3 months

## 2023-10-31 ENCOUNTER — Institutional Professional Consult (permissible substitution): Admit: 2023-10-31 | Discharge: 2023-11-01 | Payer: MEDICARE

## 2023-10-31 DIAGNOSIS — K52839 Microscopic colitis, unspecified: Principal | ICD-10-CM

## 2023-10-31 DIAGNOSIS — K5100 Ulcerative (chronic) pancolitis without complications: Principal | ICD-10-CM

## 2023-10-31 NOTE — Unmapped (Unsigned)
[facility]  CLINICAL PHARMACIST PRACTITIONER VISIT    10/31/2023    HPI:     I saw Traci Bennett today for follow up after initiation of etrasimod for management of her Ulcerative Colitis at the request of Dr. Loreli Dollar.    Interval History:  Traci Bennett reports doing okay. She has not yet started Velsipity as she has been waiting for clearance. She has been cleared by GI surgery who does not believe the abscess on buttocks is cellulitis so okay to proceed with Velsipity for colitis. She reports 2-3 loose, watery bowel movements per day with diarrhea during the night. She has urgency and reports occasional incontinence to where she wears Depends just in case. She states some pain, gas, and bloating. She continues to take loperamide and colestipol.      Evaluation of IBD Biologic Therapy: etrasimod  Time on Current Therapy: Day 0 (not started)     - Last Dose: n/a     - Adherence: n/a     - Symptoms:  # of BM/day: 2-3   Stool Consistency: Loose, watery   Stool Urgency: Yes; occasional incontinence   Nocturnal BM: 4   Blood in Stool: none   Abdominal Pain: Some pain, bloating, gas   Nausea/Vomiting: none   Any Sickness: none   NSAID use: none   Antidiarrheal use: Loperamide, colestipol      - Adverse Effects: n/a    Other IBD Therapies:  []  5-ASA  []  Immunomodulators  []  Corticosteroids    Disease Severity Index:  Simple Clinical Colitis Activity Index:  Bowel frequency per day: 1-3 bowel movements/day= 0  Bowel frequency at night:  4-6 bowel movements/night = 2  Urgency of defecation: Incontinence = 3  Blood in stool:  None = 0  General well-being: Slightly below par = 1  Extracolonic Manifestations: None  Total score: 6       MEDICATIONS/ALLERGIES:     Current Outpatient Medications   Medication Instructions    acetaminophen (TYLENOL) 500 mg    ascorbic acid (vitamin C) (VITAMIN C) 1,000 mg    aspirin (ECOTRIN) 81 mg, Daily (standard)    bisoprolol (ZEBETA) 5 mg, Daily (standard) calcium carbonate 1,500 mg (600 mg elem calcium) tablet 1 tab Orally once a day    calcium carbonate-vit D3-min 600 mg calcium- 400 unit Tab Take by mouth.    cetirizine (ZYRTEC) 10 mg    cholecalciferol (vitamin D3-50 mcg (2,000 unit)) 2,000 Units, Daily (standard)    cinnamon bark 500 mg    coenzyme Q10 200 mg, Daily (standard)    colestipol (COLESTID) 4 g, Oral, 3 times a day (standard)    cycloSPORINE (RESTASIS) 0.05 % ophthalmic emulsion 1 drop, Every 12 hours    denosumab (PROLIA SUBQ) Every 6 months    etrasimod (VELSIPITY) 2 mg Tab Take 1 tablet (2 mg) by mouth in the morning.    famotidine (PEPCID) 10 mg, Every evening    flaxseed oil 1,000 mg cap 1 tab Orally Once a day    fluticasone propionate (FLONASE) 50 mcg/actuation nasal spray 2 sprays    Lactobacillus acidophilus 10 billion cell cap 1 capsule, Daily (standard)    levothyroxine 88 mcg, Daily (standard)    loperamide (IMODIUM) 2 mg capsule TAKE 1 CAPSULE BY MOUTH FOUR TIMES DAILY AS NEEDED FOR DIARRHEA    loratadine (CLARITIN) 10 mg tablet 1 tablet, Daily (standard)    meclizine (ANTIVERT) 25 mg tablet 1/2-1 pill up to 3 times  daily for motion sickness/dizziness    multivitamin therapeutic with minerals (THERA-M) 27-0.4 mg Tab 1 tablet, Daily (standard)    omega-3 fatty acids-fish oil 360-1,200 mg cap 1,200 mg    pantoprazole (PROTONIX) 40 MG tablet TAKE 1 TABLET BY MOUTH TWICE DAILY BEFORE BREAKFAST AND BEFORE DINNER    simvastatin (ZOCOR) 20 mg, Daily before breakfast    triamcinolone (KENALOG) 0.5 % ointment 1 Application, 2 times a day    vancomycin (VANCOCIN) 125 mg, Oral, 4 times a day       Allergies   Allergen Reactions    Bupropion Hives and Other (See Comments)    Infliximab Other (See Comments)     Unknown    Montelukast Other (See Comments)     Reaction:  Makes pt jittery    Reaction:  Makes pt jittery      Reaction:  Makes pt jittery Makes pt jittery      Makes pt jittery    Montelukast Sodium Other (See Comments)     Reaction:  Makes pt jittery  Makes pt jittery      Alendronate Sodium Other (See Comments) and Rash     Reaction:  GI upset   GI upset       Clindamycin Rash    Other Rash    Sulfa (Sulfonamide Antibiotics) Rash         IBD HISTORY:     Year of disease onset:    Diagnosis:  Indeterminate Colitis  Age at onset:   69-40 yr old (A2)  Location:  Extensive (E3)  Behavior:  Colitis  Perianal Dz:  N/A    Brief IBD Disease Course:    Diagnosed years' ago with microscopic colitis, chronic entocort use with suboptimal control of diarrhea, osteopenia and fracture after a fall. Trial of entyvio wtihout clinical response. Colonoscopy with chronic colitis changes, thickening on CT, and elevated fecal calpro (387). Diagnosis changed to indeterminate colitis, initiation of remicade monotherapy. Subsequent infectious complications with urosepsis. Remicade discontinued and maintained on mesalamine, with flare was initiated on stelara. Treated for concomitant c diff. Initial response,  then loss of response. Perirectal abscess/buttocks cellulitis.     Endoscopy:      09/25/2023 Flex Sig  Impression:     normal findings regarding rectum, sigmoid colon, descending colon and underwent   Biopsy confirming microscopic colitis, only notable for external/internal   hemorrhoids.       Extraintestinal Manifestations:   []  Joint Pains:  []  Ocular:  []  Skin:  []  Oral ulcers:  []  Thrombosis:  []  PSC:  []  Other:    Prior IBD Therapy:  [x]  5-ASAs - mesalamine  []  Oral corticosteroids  []  Intravenous corticosteroids  []  Antibiotics  []  Thiopurines   []  Methotrexate  [x]  Anti-TNF therapies - infliximab  [x]  Anti-Integrin therapies - vedolizumab  [x]  Anti-Interleukin therapies - Stelara  []  Anti-JAK therapies  []  Cyclosporine  []  Clinical trial medication  []  Other (Please specify):    IBD Health Maintenance    Vaccine Date   Influenza 08/05/2023   Pneumococcal 05/05/1915   COVID-19 1/610960   Zoster 01/01/2018. 09/13/2017, 06/20/2006   Hepatitis B --       []  Iron Deficiency  Lab Results   Component Value Date    FERRITIN 45 07/30/2023    LABIRON 23 07/30/2023    HGB 13.3 07/30/2023       []  Vitamin D Deficiency  Lab Results   Component Value Date    VITDTOTAL 57.6 07/26/2020  RELEVANT LABS, DATA, INDICES:     Lab Results   Component Value Date    HBSAG Negative 03/29/2022    QFTTBGOLD Negative 07/26/2020       Lab Results   Component Value Date    WBC 8.9 07/30/2023    HGB 13.3 07/30/2023    HCT 40.0 07/30/2023    PLT 219 07/30/2023       Lab Results   Component Value Date    NA 135 07/30/2023    K 4.4 07/30/2023    CL 100 07/30/2023    CO2 23 07/30/2023    BUN 13 07/30/2023    CREATININE 0.98 07/30/2023    GLU 88 05/11/2021    CALCIUM 9.4 07/30/2023       Lab Results   Component Value Date    BILITOT 0.4 07/30/2023    BILIDIR 0.12 06/18/2022    PROT 6.0 07/30/2023    ALBUMIN 4.9 05/11/2021    ALT 24 07/30/2023    AST 24 07/30/2023    ALKPHOS 57 07/30/2023       Lab Results   Component Value Date    CRP <1 07/30/2023    NULL 307 (H) 10/23/2023         ASSESSMENT & RECOMMENDATIONS:     Indeterminate Colitis  No assessment of therapy as patient has not start etrasimod therapy.  - Start etrasimod 2 mg daily       - Monitoring labs due in 3 months (March 2025)  - Stop Asacol  - Continue vancomycin 125 mg twice daily and colestipol 2 tabs twice daily  - Continue loperamide as needed      --------------------------------------------    Edsel Petrin, PharmD, BCPS, CPP  Clinical Pharmacist Practitioner - GI/IBD  [facility] medication  []  Other (Please specify):    IBD Health Maintenance    Vaccine Date   Influenza    Pneumococcal    COVID-19    Zoster    Hepatitis B        []  Iron Deficiency  Lab Results   Component Value Date    FERRITIN 45 07/30/2023    LABIRON 23 07/30/2023    HGB 13.3 07/30/2023       []  Vitamin D Deficiency  Lab Results   Component Value Date    VITDTOTAL 57.6 07/26/2020          RELEVANT LABS, DATA, INDICES:     Lab Results   Component Value Date    HBSAG Negative 03/29/2022    QFTTBGOLD Negative 07/26/2020       Lab Results   Component Value Date    WBC 8.9 07/30/2023    HGB 13.3 07/30/2023    HCT 40.0 07/30/2023    PLT 219 07/30/2023       Lab Results   Component Value Date    NA 135 07/30/2023    K 4.4 07/30/2023    CL 100 07/30/2023    CO2 23 07/30/2023    BUN 13 07/30/2023    CREATININE 0.98 07/30/2023    GLU 88 05/11/2021    CALCIUM 9.4 07/30/2023       Lab Results   Component Value Date    BILITOT 0.4 07/30/2023    BILIDIR 0.12 06/18/2022    PROT 6.0 07/30/2023    ALBUMIN 4.9 05/11/2021    ALT 24 07/30/2023    AST 24 07/30/2023    ALKPHOS 57 07/30/2023       Lab  Results   Component Value Date    CRP <1 07/30/2023    NULL 307 (H) 10/23/2023       No results found for: INFLIXIMAB, ANTIINFLXAB, ADALIMUMAB, AADAL, , 6TG    No results found for: 6TG, , RTPMT      ASSESSMENT & RECOMMENDATIONS:     {PK IBD Diagnosis:104755}          --------------------------------------------    Edsel Petrin, PharmD, BCPS, CPP  Clinical Pharmacist Practitioner - GI/IBD  [facility]

## 2023-10-31 NOTE — Unmapped (Addendum)
It was a pleasure seeing you today! The following was discussed at today's visit:  Start Velsipity 2 mg daily today or tomorrow  Stop Asacol  Continue vancomycin 125 mg twice daily and colestipol 2 tabs twice daily  Continue loperamide as needed    Please reach out if you have any questions or concerns. Thank you!    Edsel Petrin, PharmD, BCPS, CPP  Clinical Pharmacist Practitioner, GI/IBD  [phone]

## 2023-11-06 ENCOUNTER — Ambulatory Visit
Admission: RE | Admit: 2023-11-06 | Discharge: 2023-11-06 | Disposition: A | Discharge status: Home / Self Care | Payer: Medicare Other | Source: Ambulatory Visit | Attending: Neurology | Admitting: Neurology

## 2023-11-06 DIAGNOSIS — R200 Anesthesia of skin: Secondary | ICD-10-CM | POA: Diagnosis not present

## 2023-11-06 DIAGNOSIS — R519 Headache, unspecified: Secondary | ICD-10-CM

## 2023-11-11 NOTE — Unmapped (Addendum)
[facility] Clinical Assessment & Refill Coordination Note    Medication has been on-hold but pt is to start back up after the Holiday    Traci Bennett, DOB: [birth year]  Phone: [phone] (home)     All above HIPAA information was verified with patient.     Was a Nurse, learning disability used for this call? No    Specialty Medication(s):   Inflammatory Disorders: Velsipity     Current Outpatient Medications   Medication Sig Dispense Refill    acetaminophen (TYLENOL) 500 MG tablet Take 1 tablet (500 mg total) by mouth.      ascorbic acid, vitamin C, (VITAMIN C) 1000 MG tablet Take 1 tablet (1,000 mg total) by mouth.      aspirin (ECOTRIN) 81 MG tablet Take 1 tablet (81 mg total) by mouth daily.      bisoprolol (ZEBETA) 5 MG tablet Take 1 tablet (5 mg total) by mouth daily.      calcium carbonate 1,500 mg (600 mg elem calcium) tablet 1 tab Orally once a day      calcium carbonate-vit D3-min 600 mg calcium- 400 unit Tab Take by mouth.      cetirizine (ZYRTEC) 10 MG tablet Take 1 tablet (10 mg total) by mouth.      cholecalciferol, vitamin D3-50 mcg, 2,000 unit,, 50 mcg (2,000 unit) tablet Take 1 tablet (50 mcg total) by mouth daily.      cinnamon bark 500 mg capsule Take 500 mg by mouth.      coenzyme Q10 200 mg capsule Take 1 capsule (200 mg total) by mouth daily.      colestipol (COLESTID) 1 gram tablet Take 4 tablets (4 g total) by mouth Three (3) times a day. (Patient taking differently: Take 2 tablets (2 g total) by mouth two (2) times a day.) 360 tablet 1    cycloSPORINE (RESTASIS) 0.05 % ophthalmic emulsion Administer 1 drop to both eyes every twelve (12) hours.      denosumab (PROLIA SUBQ) Inject under the skin every six (6) months.      etrasimod (VELSIPITY) 2 mg Tab Take 1 tablet (2 mg) by mouth in the morning. 30 tablet 2    famotidine (PEPCID) 40 MG tablet Take 10 mg by mouth every evening.      flaxseed oil 1,000 mg cap 1 tab Orally Once a day      fluticasone propionate (FLONASE) 50 mcg/actuation nasal spray 2 sprays into each nostril.      Lactobacillus acidophilus 10 billion cell cap Take 1 capsule by mouth daily.      levothyroxine 88 mcg cap Take 1 capsule (88 mcg total) by mouth daily. For 6 days, skip the 7th day      loperamide (IMODIUM) 2 mg capsule TAKE 1 CAPSULE BY MOUTH FOUR TIMES DAILY AS NEEDED FOR DIARRHEA 120 capsule 1    loratadine (CLARITIN) 10 mg tablet Take 1 tablet (10 mg total) by mouth daily.      meclizine (ANTIVERT) 25 mg tablet 1/2-1 pill up to 3 times daily for motion sickness/dizziness      multivitamin therapeutic with minerals (THERA-M) 27-0.4 mg Tab Take 1 tablet by mouth daily.      omega-3 fatty acids-fish oil 360-1,200 mg cap Take 1,200 mg by mouth.      pantoprazole (PROTONIX) 40 MG tablet TAKE 1 TABLET BY MOUTH TWICE DAILY BEFORE BREAKFAST AND BEFORE DINNER 180 tablet 3    simvastatin (ZOCOR) 20 MG tablet Take 1  tablet (20 mg total) by mouth daily before breakfast.      triamcinolone (KENALOG) 0.5 % ointment Apply 1 Application topically Two (2) times a day (at 8am and 12:00).      vancomycin (VANCOCIN) 125 MG capsule TAKE ONE CAPSULE BY MOUTH FOUR TIMES DAILY. (Patient taking differently: Take 1 capsule (125 mg total) by mouth two (2) times a day.) 120 capsule 2     No current facility-administered medications for this visit.        Changes to medications: Karna reports no changes at this time.    Allergies   Allergen Reactions    Bupropion Hives and Other (See Comments)    Infliximab Other (See Comments)     Unknown    Montelukast Other (See Comments)     Reaction:  Makes pt jittery    Reaction:  Makes pt jittery      Reaction:  Makes pt jittery Makes pt jittery      Makes pt jittery    Montelukast Sodium Other (See Comments)     Reaction:  Makes pt jittery  Makes pt jittery      Alendronate Sodium Other (See Comments) and Rash     Reaction:  GI upset   GI upset       Clindamycin Rash    Other Rash    Sulfa (Sulfonamide Antibiotics) Rash       Changes to allergies: No    SPECIALTY MEDICATION ADHERENCE     Velsipity 2 mg: ~20 days of medicine on hand     Medication Adherence    Patient reported X missed doses in the last month: >5  Specialty Medication: Velsipity 2mg  - 1qd  Patient is on additional specialty medications: No  Informant: patient          Specialty medication(s) dose(s) confirmed: Regimen is correct and unchanged.     Are there any concerns with adherence? No    Adherence counseling provided? Not needed    CLINICAL MANAGEMENT AND INTERVENTION      Clinical Benefit Assessment:    Do you feel the medicine is effective or helping your condition? Yes    Clinical Benefit counseling provided? Not needed    Adverse Effects Assessment:    Are you experiencing any side effects? No    Are you experiencing difficulty administering your medicine? No    Quality of Life Assessment:    Quality of Life    Rheumatology  Oncology  Dermatology  Cystic Fibrosis          How many days over the past month did your UC  keep you from your normal activities? For example, brushing your teeth or getting up in the morning. 0    Have you discussed this with your provider? Not needed    Acute Infection Status:    Acute infections noted within Epic:  No active infections  Patient reported infection: None    Therapy Appropriateness:    Is therapy appropriate based on current medication list, adverse reactions, adherence, clinical benefit and progress toward achieving therapeutic goals? Yes, therapy is appropriate and should be continued     DISEASE/MEDICATION-SPECIFIC INFORMATION      N/A    Chronic Inflammatory Diseases: Have you experienced any flares in the last month? No  Has this been reported to your provider? Not applicable    PATIENT SPECIFIC NEEDS     Does the patient have any physical, cognitive, or cultural barriers? No    Is  the patient high risk? No    Did the patient require a clinical intervention? No    Does the patient require physician intervention or other additional services (i.e., nutrition, smoking cessation, social work)? No    SOCIAL DETERMINANTS OF HEALTH     At the [facility], we have learned that life circumstances - like trouble affording food, housing, utilities, or transportation can affect the health of many of our patients.   That is why we wanted to ask: are you currently experiencing any life circumstances that are negatively impacting your health and/or quality of life? No    Social Drivers of Health     Food Insecurity: No Food Insecurity (09/30/2023)    Received from [facility]    Hunger Vital Sign     Worried About Running Out of Food in the Last Year: Never true     Ran Out of Food in the Last Year: Never true   Internet Connectivity: Not on file   Housing/Utilities: Not on file   Tobacco Use: Medium Risk (10/31/2023)    Patient History     Smoking Tobacco Use: Former     Smokeless Tobacco Use: Never     Passive Exposure: Not on file   Transportation Needs: No Transportation Needs (09/30/2023)    Received from [facility] - Transportation     Lack of Transportation (Medical): No     Lack of Transportation (Non-Medical): No   Alcohol Use: Not on file   Interpersonal Safety: Not on file   Physical Activity: Not on file   Intimate Partner Violence: Not At Risk (09/30/2023)    Received from [facility]    Humiliation, Afraid, Rape, and Kick questionnaire     Fear of Current or Ex-Partner: No     Emotionally Abused: No     Physically Abused: No     Sexually Abused: No   Stress: Not on file   Substance Use: Not on file (09/23/2023)   Social Connections: Not on file   Financial Resource Strain: Not on file   Depression: Not at risk (07/11/2023)    Received from [facility]    PHQ-2     Patient Health Questionnaire-2 Score: 0   Health Literacy: Not on file       Would you be willing to receive help with any of the needs that you have identified today? Not applicable       SHIPPING     Specialty Medication(s) to be Shipped:   Inflammatory Disorders: Velsipity    Other medication(s) to be shipped: No additional medications requested for fill at this time     Changes to insurance: No    Delivery Scheduled: Yes, Expected medication delivery date: 12/30.     Medication will be delivered via UPS to the confirmed prescription address in [facility].    The patient will receive a drug information handout for each medication shipped and additional FDA Medication Guides as required.  Verified that patient has previously received a Conservation officer, historic buildings and a Surveyor, mining.    The patient or caregiver noted above participated in the development of this care plan and knows that they can request review of or adjustments to the care plan at any time.      All of the patient's questions and concerns have been addressed.    Teofilo Pod, PharmD   [facility] Specialty Pharmacist

## 2023-11-15 MED FILL — VELSIPITY 2 MG TABLET: ORAL | 30 days supply | Qty: 30 | Fill #1

## 2023-11-17 NOTE — Progress Notes (Unsigned)
Assessment and Plan:  Debbie Houston was seen today for acute visit.  Diagnoses and all orders for this visit:  Essential hypertension - continue medications, DASH diet, exercise and monitor at home. Call if greater than 130/80.   Hypothyroidism, unspecified type Please take your thyroid medication greater than 30 min before breakfast, separated by at least 4 hours  from antacids, calcium, iron, and multivitamins.  -     TSH  Seasonal allergic rhinitis, unspecified trigger Switch from Claritin to generic Zyrtec or Allegra If symptoms persist follow up with GI at [facility]- may need to stop Velsipity      Further disposition pending results of labs. Discussed med's effects and SE's.   Over 30 minutes of exam, counseling, chart review, and critical decision making was performed.   Future Appointments  Date Time Provider Department Center  12/26/2023 10:30 AM Lucky Cowboy, MD [facility] None  03/30/2024 10:30 AM Raynelle Dick, NP [facility] None  03/31/2024  1:20 PM Pollyann Savoy, MD [facility] None  04/21/2024  2:30 PM [name], DO [facility] None  08/06/2024 10:00 AM Lucky Cowboy, MD [facility] None  09/23/2024 10:00 AM Raynelle Dick, NP [facility] None    ------------------------------------------------------------------------------------------------------------------   HPI BP 134/78   Pulse 72   Temp 98.1 F (36.7 C)   Ht 5\' 4"  (1.626 m)   Wt 110 lb 3.2 oz (50 kg)   SpO2 96%   BMI 18.92 kg/m  82 y.o.female presents for thick mucus uncertain of color, hoarseness, dizziness when changing position, joint pain worsening. She does have Meclizine but does not take often because it makes her so tired. She started on Velsipity x 2-3 weeks and symptoms occurred around the same time  She is on Bisoprolol 5 mg daily and BP is well controlled. Denies headaches, chest pain, shortness of breath.  BP Readings from Last 3 Encounters:  11/18/23 134/78  10/22/23 130/77  10/01/23  120/72   BMI is Body mass index is 18.92 kg/m., she has not been working on diet and exercise. Wt Readings from Last 3 Encounters:  11/18/23 110 lb 3.2 oz (50 kg)  10/22/23 109 lb (49.4 kg)  10/01/23 110 lb (49.9 kg)     Past Medical History:  Diagnosis Date   Arthritis    Benign labile hypertension    Cancer (HCC)    skin cancer on face   Cellulitis    Colitis    DJD (degenerative joint disease)    Endometrial polyp    GERD (gastroesophageal reflux disease)    History of basal cell carcinoma excision    History of esophagitis    HOH (hard of hearing)    right ear better than left  -- wears no aides   Hyperlipidemia    Hypothyroidism    Neuromuscular disorder (HCC)    neuropathy in feet   Osteopenia    Prediabetes    S/P right hip fracture 08/17/2020   Sepsis (HCC) 2023   per patient   Vitamin D deficiency    Wears glasses      Allergies  Allergen Reactions   Remicade [Infliximab] Other (See Comments)    Unknown    Fosamax [Alendronate Sodium] Other (See Comments)    GI upset    Montelukast Other (See Comments)    Reaction:  Makes pt jittery    Reaction:  Makes pt jittery  Reaction:  Makes pt jittery Makes pt jittery  Makes pt jittery   Singulair [Montelukast Sodium] Other (See Comments)  Makes pt jittery   Wellbutrin [Bupropion] Hives   Clindamycin/Lincomycin Rash   Sulfa Antibiotics Rash    Current Outpatient Medications on File Prior to Visit  Medication Sig   acetaminophen (TYLENOL) 500 MG tablet Take 500 mg by mouth as needed for moderate pain (pain score 4-6) or headache.   Ascorbic Acid (VITAMIN C) 1000 MG tablet Take 1,000 mg by mouth daily.    aspirin EC 81 MG tablet Take 1 tablet (81 mg total) by mouth in the morning and at bedtime. (Patient taking differently: Take 81 mg by mouth at bedtime.)   bisoprolol (ZEBETA) 5 MG tablet Take 1 tablet every Morning for BP (Patient taking differently: Take 5 mg by mouth at bedtime.)   Calcium  Carbonate-Vit D-Min (CALCIUM 1200 PO) Take 1,200 mg by mouth daily.   CINNAMON PO Take 2,000 mg by mouth daily.   Coenzyme Q10 (COQ10) 200 MG CAPS Take 200 mg by mouth daily.   COLESTIPOL HCL PO Take 2 tablets by mouth in the morning and at bedtime.   cycloSPORINE (RESTASIS) 0.05 % ophthalmic emulsion Place 2 drops into both eyes 2 (two) times daily.   Fluticasone Propionate (FLONASE NA) Place 1 spray into the nose as needed (allergies).   levothyroxine (SYNTHROID) 88 MCG tablet Take 88 mcg by mouth daily before breakfast.   loperamide (IMODIUM) 2 MG capsule Take 2 mg by mouth 2 (two) times daily as needed for diarrhea or loose stools.   meclizine (ANTIVERT) 25 MG tablet 1/2-1 pill up to 3 times daily for motion sickness/dizziness (Patient taking differently: Take 25 mg by mouth as needed for dizziness or nausea.)   Multiple Vitamin (THERA VITAMIN PO) Take 1 tablet by mouth daily.   Omega-3 Fatty Acids (FISH OIL) 1200 MG CAPS Take 1,200 mg by mouth daily.    pantoprazole (PROTONIX) 40 MG tablet Take 1 tablet  2 x /day  for Heartburn & Indigestion (Patient taking differently: Take 40 mg by mouth 2 (two) times daily.)   Probiotic Product (PROBIOTIC PO) Take 1 capsule by mouth daily.   PROLIA 60 MG/ML SOSY injection Inject 60 mg into the skin every 6 (six) months.   pseudoephedrine (SUDAFED) 120 MG 12 hr tablet Take  1 tablet  2 x /day (every 12 hours)  for Head and Chest Congestion (Patient taking differently: Take 120 mg by mouth as needed for congestion.)   simvastatin (ZOCOR) 20 MG tablet Take 1 tablet at Bedtime for Cholesterol   vancomycin (VANCOCIN) 125 MG capsule Take 125 mg by mouth in the morning and at bedtime.   cetirizine (ZYRTEC ALLERGY) 10 MG tablet Take 10 mg by mouth daily as needed for allergies. (Patient not taking: Reported on 11/18/2023)   clotrimazole-betamethasone (LOTRISONE) cream Apply 1 Application topically 2 (two) times daily. (Patient not taking: Reported on 10/22/2023)    Etrasimod Arginine (VELSIPITY) 2 MG TABS Take 2 mg by mouth daily. (Patient taking differently: Take 2 mg by mouth daily. On Hold)   mesalamine (LIALDA) 1.2 g EC tablet Take 2.4 g by mouth in the morning and at bedtime.   triamcinolone ointment (KENALOG) 0.5 % Apply 1 Application topically 2 (two) times daily. (Patient not taking: Reported on 10/22/2023)   No current facility-administered medications on file prior to visit.    ROS: all negative except above.   Physical Exam:  BP 134/78   Pulse 72   Temp 98.1 F (36.7 C)   Ht 5\' 4"  (1.626 m)   Wt 110 lb 3.2  oz (50 kg)   SpO2 96%   BMI 18.92 kg/m   General Appearance: Well nourished, in no apparent distress. Eyes: PERRLA, EOMs, conjunctiva no swelling or erythema Sinuses: No Frontal/maxillary tenderness ENT/Mouth: Ext aud canals clear, TMs without erythema, slight bulging noted bilaterally. No erythema, swelling, or exudate on post pharynx.  Hard of Hearing - bilateral hearing aides  Neck: Supple, thyroid normal.  Respiratory: Respiratory effort normal, BS equal bilaterally without rales, rhonchi, wheezing or stridor.  Cardio: RRR with no MRGs. Brisk peripheral pulses without edema.  Abdomen: Soft, + BS.  Non tender, no guarding, rebound, hernias, masses. Lymphatics: Non tender without lymphadenopathy.  Musculoskeletal: Full ROM, 5/5 strength, normal gait.  Skin: Warm, dry without rashes, lesions, ecchymosis.  Neuro: Cranial nerves intact. Normal muscle tone, no cerebellar symptoms. Sensation intact.  Psych: Awake and oriented X 3, normal affect, Insight and Judgment appropriate.     Raynelle Dick, NP 10:19 AM [facility]

## 2023-11-18 ENCOUNTER — Encounter: Payer: Self-pay | Admitting: Nurse Practitioner

## 2023-11-18 ENCOUNTER — Other Ambulatory Visit: Payer: Self-pay

## 2023-11-18 ENCOUNTER — Ambulatory Visit (INDEPENDENT_AMBULATORY_CARE_PROVIDER_SITE_OTHER): Payer: Medicare Other | Admitting: Nurse Practitioner

## 2023-11-18 VITALS — BP 134/78 | HR 72 | Temp 98.1°F | Ht 64.0 in | Wt 110.2 lb

## 2023-11-18 DIAGNOSIS — E039 Hypothyroidism, unspecified: Secondary | ICD-10-CM | POA: Diagnosis not present

## 2023-11-18 DIAGNOSIS — J302 Other seasonal allergic rhinitis: Secondary | ICD-10-CM | POA: Diagnosis not present

## 2023-11-18 DIAGNOSIS — I10 Essential (primary) hypertension: Secondary | ICD-10-CM | POA: Diagnosis not present

## 2023-11-18 NOTE — Patient Instructions (Signed)
Switch from claritin to zyrtec or allegra Monitor symptoms and if no change in symptoms stop Velsipity to see if cause of symptoms

## 2023-11-19 ENCOUNTER — Other Ambulatory Visit: Payer: Self-pay | Admitting: Nurse Practitioner

## 2023-11-19 ENCOUNTER — Encounter: Payer: Self-pay | Admitting: Nurse Practitioner

## 2023-11-19 LAB — TSH: TSH: 0.18 m[IU]/L — ABNORMAL LOW (ref 0.40–4.50)

## 2023-11-19 MED ORDER — LEVOTHYROXINE SODIUM 88 MCG PO TABS: ORAL_TABLET | ORAL | Status: DC

## 2023-11-19 MED ORDER — LEVOTHYROXINE SODIUM 88 MCG PO TABS: ORAL_TABLET | ORAL | 3 refills | Status: AC

## 2023-11-19 NOTE — Telephone Encounter (Signed)
Can you make sure this prescription is called in like I noted

## 2023-11-20 DIAGNOSIS — M4800 Spinal stenosis, site unspecified: Secondary | ICD-10-CM

## 2023-11-20 HISTORY — DX: Spinal stenosis, site unspecified: M48.00

## 2023-11-28 DIAGNOSIS — H16143 Punctate keratitis, bilateral: Secondary | ICD-10-CM | POA: Diagnosis not present

## 2023-11-28 DIAGNOSIS — H26492 Other secondary cataract, left eye: Secondary | ICD-10-CM | POA: Diagnosis not present

## 2023-11-29 ENCOUNTER — Telehealth: Payer: Self-pay | Admitting: Neurology

## 2023-11-29 NOTE — Telephone Encounter (Signed)
 Called patient and informed her of MRI brain results. Patient verbalized understanding and had no further questions  or concerns.

## 2023-11-29 NOTE — Telephone Encounter (Signed)
 Pt called in returning a call about results from last week

## 2023-12-05 ENCOUNTER — Other Ambulatory Visit: Payer: Self-pay

## 2023-12-05 ENCOUNTER — Ambulatory Visit: Payer: PPO | Attending: Sports Medicine

## 2023-12-05 DIAGNOSIS — G8929 Other chronic pain: Secondary | ICD-10-CM | POA: Diagnosis present

## 2023-12-05 DIAGNOSIS — R262 Difficulty in walking, not elsewhere classified: Secondary | ICD-10-CM | POA: Insufficient documentation

## 2023-12-05 DIAGNOSIS — M25552 Pain in left hip: Secondary | ICD-10-CM | POA: Diagnosis present

## 2023-12-05 DIAGNOSIS — R293 Abnormal posture: Secondary | ICD-10-CM | POA: Diagnosis present

## 2023-12-05 DIAGNOSIS — M6281 Muscle weakness (generalized): Secondary | ICD-10-CM | POA: Insufficient documentation

## 2023-12-05 DIAGNOSIS — M25551 Pain in right hip: Secondary | ICD-10-CM | POA: Insufficient documentation

## 2023-12-05 DIAGNOSIS — M25562 Pain in left knee: Secondary | ICD-10-CM | POA: Insufficient documentation

## 2023-12-05 DIAGNOSIS — R252 Cramp and spasm: Secondary | ICD-10-CM | POA: Diagnosis present

## 2023-12-05 DIAGNOSIS — M5459 Other low back pain: Secondary | ICD-10-CM | POA: Insufficient documentation

## 2023-12-05 DIAGNOSIS — M25561 Pain in right knee: Secondary | ICD-10-CM | POA: Diagnosis present

## 2023-12-05 NOTE — Therapy (Addendum)
 OUTPATIENT PHYSICAL THERAPY LOWER EXTREMITY EVALUATION   Patient Name: Debbie Houston MRN: [id number] DOB:06/26/1941, 83 y.o., female Today's Date: 12/06/2023  END OF SESSION:  PT End of Session - 12/05/23 1405     Visit Number 1    Date for PT Re-Evaluation 01/30/24    Authorization Type Medicare    Progress Note Due on Visit 10    PT Start Time 1406    PT Stop Time 1448    PT Time Calculation (min) 42 min             Past Medical History:  Diagnosis Date   Arthritis    Benign labile hypertension    Cancer (HCC)    skin cancer on face   Cellulitis    Colitis    DJD (degenerative joint disease)    Endometrial polyp    GERD (gastroesophageal reflux disease)    History of basal cell carcinoma excision    History of esophagitis    HOH (hard of hearing)    right ear better than left  -- wears no aides   Hyperlipidemia    Hypothyroidism    Neuromuscular disorder (HCC)    neuropathy in feet   Osteopenia    Prediabetes    S/P right hip fracture 08/17/2020   Sepsis (HCC) 2023   per patient   Vitamin D deficiency    Wears glasses    Past Surgical History:  Procedure Laterality Date   BACK SURGERY  2020   BIOPSY  09/27/2023   Procedure: BIOPSY;  Surgeon: Vida Rigger, MD;  Location: [facility] ENDOSCOPY;  Service: Gastroenterology;;   CATARACT EXTRACTION, BILATERAL Bilateral 2020   CONVERSION TO TOTAL HIP Right 01/22/2022   Procedure: CONVERSION TO TOTAL HIP;  Surgeon: Joen Laura, MD;  Location: [facility] ORS;  Service: Orthopedics;  Laterality: Right;   DILATATION & CURETTAGE/HYSTEROSCOPY WITH MYOSURE N/A 10/27/2014   Procedure: DILATATION & CURETTAGE/HYSTEROSCOPY WITH MYOSURE;  Surgeon: Juluis Mire, MD;  Location: [facility] [place];  Service: Gynecology;  Laterality: N/A;   DILATION AND CURETTAGE OF UTERUS     FLEXIBLE SIGMOIDOSCOPY N/A 09/27/2023   Procedure: FLEXIBLE SIGMOIDOSCOPY;  Surgeon: Vida Rigger, MD;  Location: [facility] ENDOSCOPY;  Service: Gastroenterology;   Laterality: N/A;   HIP SURGERY Right 04/2020   rod and screw in right hip    MOHS SURGERY  2005   LEFT NASAL BRIDGE FOR BASAL CELL   TONSILLECTOMY  age 46   Patient Active Problem List   Diagnosis Date Noted   Hyponatremia 09/26/2023   Cellulitis of left buttock 09/26/2023   Proctitis 09/25/2023   History of microscopic colitis 09/25/2023   GERD (gastroesophageal reflux disease) 09/25/2023   Sepsis secondary to UTI (HCC) 06/24/2022   Extensor tenosynovitis of right wrist 06/23/2022   Thrombocytopenia (HCC) 06/23/2022   Prediabetes 06/23/2022   Intertrochanteric fracture of right femur, closed, with nonunion, subsequent encounter 01/22/2022   History of ulcerative colitis 07/10/2021   Complete right bundle branch block (RBBB) 05/15/2021   Body mass index (BMI) of 19.0-19.9 in adult 05/12/2021   Bilateral nephrolithiasis 09/21/2020   Senile purpura (HCC) 08/17/2020   Bilateral temporomandibular joint pain 06/15/2020   Spondylolisthesis at L4-L5 level 11/25/2018   Essential hypertension    Hyperlipidemia    Hypothyroidism    GERD    Asthma    DJD (degenerative joint disease)    Vitamin D deficiency    Osteoporosis with current pathological fracture    Allergy  PCP:   Lucky Cowboy, MD    REFERRING PROVIDER: Melina Fiddler, MD  REFERRING DIAG:  Diagnosis  M17.0 (ICD-10-CM) - Bilateral primary osteoarthritis of knee  R29.898 (ICD-10-CM) - Hip weakness  M54.50 (ICD-10-CM) - Low back pain    THERAPY DIAG:  Chronic pain of both knees  Difficulty in walking, not elsewhere classified  Muscle weakness (generalized)  Cramp and spasm  Abnormal posture  Other low back pain  Pain in right hip  Pain in left hip  Rationale for Evaluation and Treatment: Rehabilitation  ONSET DATE: 12/03/2023  SUBJECTIVE:   SUBJECTIVE STATEMENT: Patient reports she has been having bilateral knee pain for quite some time.  She has hip pain from a fall she sustained 2  years ago.  She subsequently had THA right side due to this ORIF failing.  She is typically quite active but admits she has been doing less due to the pain.  She likes to do activities with friends, go to the beach, read and watch TV.  She also has low back pain which she feels is likely due to her compensating for her hip and knee pain.  She hopes to get her pain better controlled and avoid knee replacements.    PERTINENT HISTORY: Right hip fx from fall 2 years ago, initial ORIF, then subsequent THA.   PAIN:  Are you having pain? Yes: NPRS scale: Sitting still 0/10, if I sit longer, it gets really stiff then as much as 5-6 /10 Pain location: knees, hips, low back  Pain description: aching Aggravating factors: prolonged sitting or standing Relieving factors: Tylenol arthritis  PRECAUTIONS: None  RED FLAGS: Bowel or bladder incontinence: Yes: age related    WEIGHT BEARING RESTRICTIONS: No  FALLS:  Has patient fallen in last 6 months? No  LIVING ENVIRONMENT: Lives with: lives with their family and lives with their spouse Lives in: House/apartment Stairs: Yes: Internal: 12 steps; can reach both and External: 4 steps; can reach both Has following equipment at home: None  OCCUPATION: retired  PLOF: Independent, Independent with basic ADLs, Independent with household mobility without device, Independent with community mobility without device, Independent with homemaking with ambulation, Independent with gait, Independent with transfers, Vocation/Vocational requirements: retired, and Leisure: read, watch TV, go out with friends and go to the beach  PATIENT GOALS: to be able to walk and do routine daily activities with manageable pain  NEXT MD VISIT: na  OBJECTIVE:  Note: Objective measures were completed at Evaluation unless otherwise noted.  DIAGNOSTIC FINDINGS: na  PATIENT SURVEYS:  LEFS 41/80  COGNITION: Overall cognitive status: Within functional limits for tasks  assessed     SENSATION: WFL   MUSCLE LENGTH: Hamstrings: Right 40 deg; Left 45 deg Thomas test: Right pos; Left pos  POSTURE: rounded shoulders and forward head  PALPATION: Palpable crepitus bilateral knees on seated flexion / extension  LOWER EXTREMITY ROM:  WFL  LOWER EXTREMITY MMT:  Bilateral hips generally 4-/5, bilateral knees generally 4/5, ankles 5/5  LOWER EXTREMITY SPECIAL TESTS:  Hip special tests: Luisa Hart (FABER) test: positive  and Thomas test: positive  Knee special tests: did not need this one.  FUNCTIONAL TESTS:  5 times sit to stand: 16.49 sec Timed up and go (TUG): 12.18  GAIT: Distance walked: 30 feet Assistive device utilized: None Level of assistance: Complete Independence Comments: antalgic start up then short step length with limited heel strike  TREATMENT DATE:  12/05/23 Initial eval completed and initiated HEP    PATIENT EDUCATION:  Education details: Initiated HEP Person educated: Patient Education method: Programmer, multimedia, Facilities manager, Verbal cues, and Handouts Education comprehension: verbalized understanding, returned demonstration, and verbal cues required  HOME EXERCISE PROGRAM: Access Code: [email or web address] URL: [email or web address] Date: 12/05/2023 Prepared by: Mikey Kirschner  Exercises - Standing Hamstring Stretch on Chair  - 1 x daily - 7 x weekly - 1 sets - 3 reps - 30 sec hold - Standing Quad Stretch with Table and Chair Support  - 1 x daily - 7 x weekly - 1 sets - 3 reps - 30 sec hold - Seated Piriformis Stretch with Trunk Bend  - 1 x daily - 7 x weekly - 1 sets - 3 reps - 30 sec hold  ASSESSMENT:  CLINICAL IMPRESSION: Patient is a 83 y.o. female who was seen today for physical therapy evaluation and treatment for bilateral knee and hip pain along with low back pain.  She presents with  decreased strength in bilateral hips and knees along with significant flexibility issues and elevated pain.  This has resulted in a loss of function and difficulty with ADL's and IADL's.   The right hip feels palpably abnormal in position but this could be do to severe atrophy and an incision that is minimally mobile.  She would benefit from skilled PT for LE strengthening, flexibility, core strengthening, gait training and pain control.    OBJECTIVE IMPAIRMENTS: Abnormal gait, decreased balance, decreased coordination, decreased mobility, difficulty walking, decreased ROM, decreased strength, increased fascial restrictions, increased muscle spasms, impaired flexibility, postural dysfunction, and pain.   ACTIVITY LIMITATIONS: carrying, lifting, bending, sitting, standing, squatting, sleeping, stairs, transfers, bed mobility, continence, bathing, toileting, dressing, hygiene/grooming, and caring for others  PARTICIPATION LIMITATIONS: meal prep, cleaning, laundry, driving, shopping, community activity, yard work, and church  PERSONAL FACTORS: Age, Fitness, Time since onset of injury/illness/exacerbation, and 3+ comorbidities: previous back surgery, prior hip surgery x 2, Htn, Osteopenia  are also affecting patient's functional outcome.   REHAB POTENTIAL: Fair multiple former orthopedic issues and age  CLINICAL DECISION MAKING: Evolving/moderate complexity  EVALUATION COMPLEXITY: Moderate   GOALS: Goals reviewed with patient? Yes  SHORT TERM GOALS: Target date: 01/02/2024  Pain report to be no greater than 4/10  Baseline: Goal status: INITIAL  2.  Patient will be independent with initial HEP  Baseline:  Goal status: INITIAL   LONG TERM GOALS: Target date: 01/30/2024   Patient to report pain no greater than 2/10  Baseline:  Goal status: INITIAL  2.  Patient to be independent with advanced HEP  Baseline:  Goal status: INITIAL  3.  Patient to be able to bend, stoop and squat with  pain no greater than 2/10  Baseline:  Goal status: INITIAL  4.  Patient to be able to ascend and descend steps without pain or no greater than 2/10  Baseline:  Goal status: INITIAL  5.  Patient to be able to stand or walk for at least 15 min without leg pain  Baseline:  Goal status: INITIAL  6.  LEFS score to improve by 2-4 points Baseline:  Goal status: INITIAL   PLAN:  PT FREQUENCY: 1-2x/week  PT DURATION: 8 weeks  PLANNED INTERVENTIONS: 97110-Therapeutic exercises, 97530- Therapeutic activity, [id number]- Neuromuscular re-education, 97535- Self Care, 61607- Manual therapy, [id number]- Gait training, [phone]- Aquatic Therapy, [id number]- Electrical stimulation (unattended), [id number]- Electrical stimulation (manual), [id number]- Vasopneumatic device, [id number]- Ultrasound, [id number]- Ionotophoresis 4mg /ml Dexamethasone, Patient/Family education, Balance training, Stair  training, Taping, Dry Needling, Joint mobilization, Spinal mobilization, Compression bandaging, Vestibular training, Visual/preceptual remediation/compensation, DME instructions, Cryotherapy, and Moist heat  PLAN FOR NEXT SESSION: Review HEP, Nustep, begin quad rehab   [employer] B. Mercer Peifer, PT 12/06/23 7:08 AM [facility] [address] [place], [place] [address] Phone # [phone] Fax [phone]

## 2023-12-09 ENCOUNTER — Encounter: Payer: Self-pay | Admitting: Rehabilitative and Restorative Service Providers"

## 2023-12-09 ENCOUNTER — Ambulatory Visit: Payer: PPO | Admitting: Rehabilitative and Restorative Service Providers"

## 2023-12-09 DIAGNOSIS — R293 Abnormal posture: Secondary | ICD-10-CM

## 2023-12-09 DIAGNOSIS — M25561 Pain in right knee: Secondary | ICD-10-CM | POA: Diagnosis not present

## 2023-12-09 DIAGNOSIS — M25551 Pain in right hip: Secondary | ICD-10-CM

## 2023-12-09 DIAGNOSIS — R252 Cramp and spasm: Secondary | ICD-10-CM

## 2023-12-09 DIAGNOSIS — G8929 Other chronic pain: Secondary | ICD-10-CM

## 2023-12-09 DIAGNOSIS — M25552 Pain in left hip: Secondary | ICD-10-CM

## 2023-12-09 DIAGNOSIS — R262 Difficulty in walking, not elsewhere classified: Secondary | ICD-10-CM

## 2023-12-09 DIAGNOSIS — M5459 Other low back pain: Secondary | ICD-10-CM

## 2023-12-09 DIAGNOSIS — M6281 Muscle weakness (generalized): Secondary | ICD-10-CM

## 2023-12-09 NOTE — Therapy (Signed)
OUTPATIENT PHYSICAL THERAPY TREATMENT NOTE   Patient Name: Debbie Houston MRN: [id number] DOB:[birth year], 83 y.o., female Today's Date: 12/09/2023  END OF SESSION:  PT End of Session - 12/09/23 0809     Visit Number 2    Date for PT Re-Evaluation 01/30/24    Authorization Type Medicare    Progress Note Due on Visit 10    PT Start Time 0803    PT Stop Time 0841    PT Time Calculation (min) 38 min    Activity Tolerance Patient tolerated treatment well    Behavior During Therapy WFL for tasks assessed/performed             Past Medical History:  Diagnosis Date   Arthritis    Benign labile hypertension    Cancer (HCC)    skin cancer on face   Cellulitis    Colitis    DJD (degenerative joint disease)    Endometrial polyp    GERD (gastroesophageal reflux disease)    History of basal cell carcinoma excision    History of esophagitis    HOH (hard of hearing)    right ear better than left  -- wears no aides   Hyperlipidemia    Hypothyroidism    Neuromuscular disorder (HCC)    neuropathy in feet   Osteopenia    Prediabetes    S/P right hip fracture 08/17/2020   Sepsis (HCC) 2023   per patient   Vitamin D deficiency    Wears glasses    Past Surgical History:  Procedure Laterality Date   BACK SURGERY  2020   BIOPSY  09/27/2023   Procedure: BIOPSY;  Surgeon: Vida Rigger, MD;  Location: [facility] ENDOSCOPY;  Service: Gastroenterology;;   CATARACT EXTRACTION, BILATERAL Bilateral 2020   CONVERSION TO TOTAL HIP Right 01/22/2022   Procedure: CONVERSION TO TOTAL HIP;  Surgeon: Joen Laura, MD;  Location: [facility] ORS;  Service: Orthopedics;  Laterality: Right;   DILATATION & CURETTAGE/HYSTEROSCOPY WITH MYOSURE N/A 10/27/2014   Procedure: DILATATION & CURETTAGE/HYSTEROSCOPY WITH MYOSURE;  Surgeon: Juluis Mire, MD;  Location: [facility] ;  Service: Gynecology;  Laterality: N/A;   DILATION AND CURETTAGE OF UTERUS     FLEXIBLE SIGMOIDOSCOPY N/A 09/27/2023    Procedure: FLEXIBLE SIGMOIDOSCOPY;  Surgeon: Vida Rigger, MD;  Location: [facility] ENDOSCOPY;  Service: Gastroenterology;  Laterality: N/A;   HIP SURGERY Right 04/2020   rod and screw in right hip    MOHS SURGERY  2005   LEFT NASAL BRIDGE FOR BASAL CELL   TONSILLECTOMY  age 74   Patient Active Problem List   Diagnosis Date Noted   Hyponatremia 09/26/2023   Cellulitis of left buttock 09/26/2023   Proctitis 09/25/2023   History of microscopic colitis 09/25/2023   GERD (gastroesophageal reflux disease) 09/25/2023   Sepsis secondary to UTI (HCC) 06/24/2022   Extensor tenosynovitis of right wrist 06/23/2022   Thrombocytopenia (HCC) 06/23/2022   Prediabetes 06/23/2022   Intertrochanteric fracture of right femur, closed, with nonunion, subsequent encounter 01/22/2022   History of ulcerative colitis 07/10/2021   Complete right bundle branch block (RBBB) 05/15/2021   Body mass index (BMI) of 19.0-19.9 in adult 05/12/2021   Bilateral nephrolithiasis 09/21/2020   Senile purpura (HCC) 08/17/2020   Bilateral temporomandibular joint pain 06/15/2020   Spondylolisthesis at L4-L5 level 11/25/2018   Essential hypertension    Hyperlipidemia    Hypothyroidism    GERD    Asthma    DJD (degenerative joint disease)    Vitamin  D deficiency    Osteoporosis with current pathological fracture    Allergy     PCP:   Lucky Cowboy, MD    REFERRING PROVIDER: Melina Fiddler, MD  REFERRING DIAG:  Diagnosis  M17.0 (ICD-10-CM) - Bilateral primary osteoarthritis of knee  R29.898 (ICD-10-CM) - Hip weakness  M54.50 (ICD-10-CM) - Low back pain    THERAPY DIAG:  Chronic pain of both knees  Difficulty in walking, not elsewhere classified  Muscle weakness (generalized)  Cramp and spasm  Abnormal posture  Other low back pain  Pain in right hip  Pain in left hip  Rationale for Evaluation and Treatment: Rehabilitation  ONSET DATE: 12/03/2023  SUBJECTIVE:   SUBJECTIVE STATEMENT: Patient  reports that she is having some difficulty with some of the exercises.  PERTINENT HISTORY: Right hip fx from fall 2 years ago, initial ORIF, then subsequent THA.   PAIN:  Are you having pain? Yes: NPRS scale: 4/10 Pain location: knees, hips, low back  Pain description: aching Aggravating factors: prolonged sitting or standing Relieving factors: Tylenol arthritis  PRECAUTIONS: None  RED FLAGS: Bowel or bladder incontinence: Yes: age related    WEIGHT BEARING RESTRICTIONS: No  FALLS:  Has patient fallen in last 6 months? No  LIVING ENVIRONMENT: Lives with: lives with their family and lives with their spouse Lives in: House/apartment Stairs: Yes: Internal: 12 steps; can reach both and External: 4 steps; can reach both Has following equipment at home: None  OCCUPATION: retired  PLOF: Independent, Independent with basic ADLs, Independent with household mobility without device, Independent with community mobility without device, Independent with homemaking with ambulation, Independent with gait, Independent with transfers, Vocation/Vocational requirements: retired, and Leisure: read, watch TV, go out with friends and go to the beach  PATIENT GOALS: to be able to walk and do routine daily activities with manageable pain  NEXT MD VISIT: na  OBJECTIVE:  Note: Objective measures were completed at Evaluation unless otherwise noted.  DIAGNOSTIC FINDINGS: na  PATIENT SURVEYS:  Eval:  LEFS 41/80  COGNITION: Overall cognitive status: Within functional limits for tasks assessed     SENSATION: WFL   MUSCLE LENGTH: Hamstrings: Right 40 deg; Left 45 deg Thomas test: Right pos; Left pos  POSTURE: rounded shoulders and forward head  PALPATION: Palpable crepitus bilateral knees on seated flexion / extension  LOWER EXTREMITY ROM:  WFL  LOWER EXTREMITY MMT:  Bilateral hips generally 4-/5, bilateral knees generally 4/5, ankles 5/5  LOWER EXTREMITY SPECIAL TESTS:  Hip  special tests: Luisa Hart (FABER) test: positive  and Thomas test: positive  Knee special tests: did not need this one.  FUNCTIONAL TESTS:  Eval: 5 times sit to stand: 16.49 sec Timed up and go (TUG): 12.18  1/202/2024: 3 minute walk test:  529 ft with RPE of 2-3/10  GAIT: Distance walked: 30 feet Assistive device utilized: None Level of assistance: Complete Independence Comments: antalgic start up then short step length with limited heel strike  TODAY'S TREATMENT  DATE: 12/09/2023 Nustep level 3, x6 min with PT present to discuss status Seated hamstring stretch 2x20 sec bilat Seated piriformis stretch 2x20 sec bilat Seated with 2# ankle weights:  LAQ and marching.  2x10 each bilat Seated hip adduction/pelvic floor ball squeeze 2x10 Seated hip abduction clamshells with green tband 2x10 3 minute walk test:  529 ft with RPE of 2-3/10   DATE: 12/05/23 Initial eval completed and initiated HEP    PATIENT EDUCATION:  Education details: Initiated HEP Person educated: Patient Education method: Programmer, multimedia, Facilities manager, Verbal cues, and Handouts Education comprehension: verbalized understanding, returned demonstration, and verbal cues required  HOME EXERCISE PROGRAM: Access Code: [email or web address] URL: [email or web address] Date: 12/09/2023 Prepared by: Clydie Braun Anush Wiedeman  Exercises - Standing Quad Stretch with Table and Chair Support  - 1 x daily - 7 x weekly - 1 sets - 3 reps - 30 sec hold - Seated Piriformis Stretch with Trunk Bend  - 1 x daily - 7 x weekly - 3 reps - 30 sec hold - Seated Piriformis Stretch  - 1 x daily - 7 x weekly - 2 reps - 20 sec hold - Seated Hamstring Stretch  - 1 x daily - 7 x weekly - 2 reps - 20 sec hold - Seated Long Arc Quad  - 1 x daily - 7 x weekly - 2 sets - 10 reps - Seated Pelvic Floor Contraction with Isometric Hip  Adduction  - 1 x daily - 7 x weekly - 2 sets - 10 reps - Seated Hip Abduction with Resistance  - 1 x daily - 7 x weekly - 2 sets - 10 reps  ASSESSMENT:  CLINICAL IMPRESSION: Ms Lassere presents to skilled PT reporting that she is having some difficulty with some of the exercises and needs a refresher.  Patient did better with a variation of the piriformis stretch on the right LE, so added that option to the HEP.  Additionally, pt stated that she preferred the seated hamstring stretch, so made that change.  Patient able to progress with seated exercises with use of weights.  Able to perform a 3 minute walk test to establish a baseline measurement during session today.  Patient provided with updated HEP and green theraband to continue to progress at home.  Patient states that she wants to continue at once weekly at this time, but states that she felt good with the PT session and is going to be compliant with HEP.   OBJECTIVE IMPAIRMENTS: Abnormal gait, decreased balance, decreased coordination, decreased mobility, difficulty walking, decreased ROM, decreased strength, increased fascial restrictions, increased muscle spasms, impaired flexibility, postural dysfunction, and pain.   ACTIVITY LIMITATIONS: carrying, lifting, bending, sitting, standing, squatting, sleeping, stairs, transfers, bed mobility, continence, bathing, toileting, dressing, hygiene/grooming, and caring for others  PARTICIPATION LIMITATIONS: meal prep, cleaning, laundry, driving, shopping, community activity, yard work, and church  PERSONAL FACTORS: Age, Fitness, Time since onset of injury/illness/exacerbation, and 3+ comorbidities: previous back surgery, prior hip surgery x 2, Htn, Osteopenia  are also affecting patient's functional outcome.   REHAB POTENTIAL: Fair multiple former orthopedic issues and age  CLINICAL DECISION MAKING: Evolving/moderate complexity  EVALUATION COMPLEXITY: Moderate   GOALS: Goals reviewed with  patient? Yes  SHORT TERM GOALS: Target date: 01/02/2024  Pain report to be no greater than 4/10  Baseline: Goal status: Ongoing  2.  Patient will be independent with initial HEP  Baseline:  Goal status: Ongoing   LONG TERM GOALS: Target date: 01/30/2024   Patient to  report pain no greater than 2/10  Baseline:  Goal status: INITIAL  2.  Patient to be independent with advanced HEP  Baseline:  Goal status: INITIAL  3.  Patient to be able to bend, stoop and squat with pain no greater than 2/10  Baseline:  Goal status: INITIAL  4.  Patient to be able to ascend and descend steps without pain or no greater than 2/10  Baseline:  Goal status: INITIAL  5.  Patient to be able to stand or walk for at least 15 min without leg pain  Baseline:  Goal status: INITIAL  6.  LEFS score to improve by 2-4 points Baseline:  Goal status: INITIAL   PLAN:  PT FREQUENCY: 1-2x/week  PT DURATION: 8 weeks  PLANNED INTERVENTIONS: 97110-Therapeutic exercises, 97530- Therapeutic activity, 97112- Neuromuscular re-education, 97535- Self Care, 95188- Manual therapy, [phone]- Gait training, [phone]- Aquatic Therapy, 97014- Electrical stimulation (unattended), [phone]- Electrical stimulation (manual), 97016- Vasopneumatic device, [id number]- Ultrasound, [id number]- Ionotophoresis 4mg /ml Dexamethasone, Patient/Family education, Balance training, Stair training, Taping, Dry Needling, Joint mobilization, Spinal mobilization, Compression bandaging, Vestibular training, Visual/preceptual remediation/compensation, DME instructions, Cryotherapy, and Moist heat  PLAN FOR NEXT SESSION: Review HEP, Nustep, begin quad rehab   [place], PT, DPT 12/09/23, 9:55 AM  [facility] [address], [address] [place], [place] [address] Phone # [phone] Fax [phone]

## 2023-12-10 ENCOUNTER — Ambulatory Visit: Admit: 2023-12-10 | Discharge: 2023-12-11 | Payer: MEDICARE | Attending: Internal Medicine | Primary: Internal Medicine

## 2023-12-10 DIAGNOSIS — K52839 Microscopic colitis, unspecified: Principal | ICD-10-CM

## 2023-12-10 LAB — CBC W/ AUTO DIFF
BASOPHILS ABSOLUTE COUNT: 0 10*9/L (ref 0.0–0.1)
BASOPHILS RELATIVE PERCENT: 0.4 %
EOSINOPHILS ABSOLUTE COUNT: 0.2 10*9/L (ref 0.0–0.5)
EOSINOPHILS RELATIVE PERCENT: 4 %
HEMATOCRIT: 37.2 % (ref 34.0–44.0)
HEMOGLOBIN: 12.7 g/dL (ref 11.3–14.9)
LYMPHOCYTES ABSOLUTE COUNT: 0.3 10*9/L — ABNORMAL LOW (ref 1.1–3.6)
LYMPHOCYTES RELATIVE PERCENT: 5.6 %
MEAN CORPUSCULAR HEMOGLOBIN CONC: 34 g/dL (ref 32.0–36.0)
MEAN CORPUSCULAR HEMOGLOBIN: 32.1 pg (ref 25.9–32.4)
MEAN CORPUSCULAR VOLUME: 94.3 fL (ref 77.6–95.7)
MEAN PLATELET VOLUME: 8.6 fL (ref 6.8–10.7)
MONOCYTES ABSOLUTE COUNT: 0.5 10*9/L (ref 0.3–0.8)
MONOCYTES RELATIVE PERCENT: 8.2 %
NEUTROPHILS ABSOLUTE COUNT: 4.8 10*9/L (ref 1.8–7.8)
NEUTROPHILS RELATIVE PERCENT: 81.8 %
PLATELET COUNT: 192 10*9/L (ref 150–450)
RED BLOOD CELL COUNT: 3.94 10*12/L — ABNORMAL LOW (ref 3.95–5.13)
RED CELL DISTRIBUTION WIDTH: 15.1 % (ref 12.2–15.2)
WBC ADJUSTED: 5.8 10*9/L (ref 3.6–11.2)

## 2023-12-10 LAB — COMPREHENSIVE METABOLIC PANEL
ALBUMIN: 3.7 g/dL (ref 3.4–5.0)
ALKALINE PHOSPHATASE: 48 U/L (ref 46–116)
ALT (SGPT): 15 U/L (ref 10–49)
ANION GAP: 12 mmol/L (ref 5–14)
AST (SGOT): 28 U/L (ref <=34)
BILIRUBIN TOTAL: 0.4 mg/dL (ref 0.3–1.2)
BLOOD UREA NITROGEN: 11 mg/dL (ref 9–23)
BUN / CREAT RATIO: 15
CALCIUM: 9.8 mg/dL (ref 8.7–10.4)
CHLORIDE: 100 mmol/L (ref 98–107)
CO2: 25.5 mmol/L (ref 20.0–31.0)
CREATININE: 0.72 mg/dL (ref 0.55–1.02)
EGFR CKD-EPI (2021) FEMALE: 84 mL/min/{1.73_m2} (ref >=60)
GLUCOSE RANDOM: 101 mg/dL (ref 70–179)
POTASSIUM: 4.5 mmol/L (ref 3.4–4.8)
PROTEIN TOTAL: 6.3 g/dL (ref 5.7–8.2)
SODIUM: 137 mmol/L (ref 135–145)

## 2023-12-10 LAB — C-REACTIVE PROTEIN: C-REACTIVE PROTEIN: <4 mg/L (ref <=10.0)

## 2023-12-10 NOTE — Unmapped (Addendum)
Continue Velsipity 2 mg daily  Continue Vancomycin taking it with Velsipity in the morning and second dose before bedtime  Increase Imodium 2 tabs prior to bedtime  Take Colestipol before lunch and dinner  Obtain safety labs today  Prevention: Recommend tetanus vaccine can be done locally; up to date on all others  Return in 3 months by televideo or sooner if needed        APPOINTMENT SCHEDULING FOR GI CLINIC AND GI PROCEDURES:  GI MEDICINE CLINIC  [phone] option 1   GI PROCEDURES         [phone] option 2   *To schedule, reschedule, or cancel your GI appointment, please call [phone]. If you are unable to come to an appointment, please notify us as soon as possible, preferably 24 hours in advance. Doing so may allow other patients with urgent needs to be scheduled in a cancelled appointment slot.   RADIOLOGY - to schedule imaging ordered, please call [phone] opt 1     IBD NURSE COORDINATOR CONTACT - Wallis Bamberg Yopp, RN  Phone: [phone] (direct line)   Fax: [phone]  * For urgent medical concerns after hours or on weekends and holidays, call [phone] and ask to speak to the GI Fellow on call.    * If you have a GI medical question or GI symptoms and would like to speak to your provider???s healthcare team, please contact Wallis Bamberg Yopp (contact information above) OR you can send the GI healthcare team a message through MyChart at [email or web address]    TEST RESULTS   If you have a MyChart account, your new results and a provider message will be sent to you through your MyChart account at [email or web address]. For results that require follow-up, a member of your healthcare team will also contact you directly.    PRESCRIPTION REFILL REQUESTS  To request prescription refills, please contact your pharmacy or send your healthcare team a message through your MyChart account at [email or web address]  RECORD REQUESTS  For questions related to medical records, please call Medical Records Release of Information at [phone]  FINANCIAL COUNSELOR   For billing and other financial questions/needs - please contact Mee Hives at [phone]. If you need to leave a message, please be sure to leave your full name, date of birth or MR#, best call back # and reason for call.

## 2023-12-10 NOTE — Unmapped (Signed)
[facility]  CONSULT NOTE - INFLAMMATORY BOWEL DISEASE  12/10/2023    Demographics:  Traci Bennett is a 83 y.o. year old female seen for colitis, via PHONE    Referring physician:   Dr. 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 She has been on a number of therapies including steroids/budesonide (prior hip fracture), vedolizumab (no response), infliximab (response, but developed infectious complication of urosepsis), mesalamine, and most recently stelara without benefit. Her symptoms are predominantly diarrhea, incontinence. She also has a history of c diff infection and has been on vancomycin taper in the past (and uses this with need for other antibiotics). Most recently had perirectal abscess that was drained, not thought to have a fistula in that area and no further treatment needed. she had an MRI of the pelvis that showed nonspecific edema and dermal thickening noted along the medial left gluteal cleft with a small tiny rim-enhancing fluid collection measuring up to 1.2 cm in greatest dimension reflecting a small developing abscess/fistula tract. She has completed a 10 day course of Linezolid and Augmentin and has completely healed per follow up with Dr. Neysa Hotter 10/2023.  Marland Kitchen    Interval history:    She started Velsipity 2 mg daily upon above clearance. She is having 1-2 x semi-formed bowel movements daily without urgency, pain or blood.  She continues Colestid and vancomycin twice daily taking them together. She is taking 1 tab of Imodium before bedtime at this point for nocturnal awakenings (3-4 per night). She denies incontinence. Her last Calprotectin level 307 was 10/23/2023 prior to starting Velsipity. She does notice since starting Velsipity that her BP has started to rise with home readings being high 140's systolic and 90's diastolic. However, she is taking her blood pressure medication at the same time as her colestid.    She reports good energy, stable diet and weight.      Review of Systems: positive as per HPI   Otherwise, the balance of 10 systems is negative.    Brief colitis Disease Course:      Diagnosed years' ago with microscopic colitis, chronic entocort use with suboptimal control of diarrhea, osteopenia and fracture after a fall. Trial of entyvio wtihout clinical response. Colonoscopy with chronic colitis changes, thickening on CT, and elevated fecal calpro (387). Diagnosis changed to indeterminate colitis, initiation of remicade monotherapy. Subsequent infectious complications with urosepsis. Remicade discontinued and maintained on mesalamine, with flare was initiated on stelara. Treated for concomitant c diff. Initial response, then loss of response. Perirectal abscess/buttocks cellulitis healed. Started Velsipity 10/2023.       Endoscopy:        09/25/2023 Flex Sig  Impression: normal findings regarding rectum, sigmoid colon, descending colon   and underwent biopsy confirming microscopic colitis, only notable for   external/internal hemorrhoids.       02/06/2023 Colonoscopy  Impression:    - Non-thrombosed internal hemorrhoids found on perianal exam.                         - The entire examined colon is normal. Biopsied from the right and left   colon to rule out inflammation.  Stool sample aspirated for c. difficile.                         - The examined portion of the ileum was normal.                         - Non-bleeding hemorrhoids.    Pathology:    A. Ascending colon, biopsy:  -Microscopic colitis with features suggestive of collagenous colitis.     B. Descending colon, biopsy:  -Microscopic colitis with features suggestive of collagenous colitis.     10/16/2021 Colonoscopy   Impression:     - Nodular mucosa in the sigmoid colon, in the descending colon, in the ascending colon and in the cecum. Biopsied.    Upper endoscopy:  Impression:    - Small hiatal hernia.                         - Erythematous mucosa in the prepyloric region of the stomach. Biopsied.                         - Normal examined duodenum.    Pathology:  Stomach, reactive foveolar hyperplasia, and mild chronic   inactive gastritis    Cecum, mildly active colitis with features of chronicity   Colon, no evidence of active colitis or increase in intraepithelial   lymphocytes     04/2019 EGD Barrett's esophagus - esophagus biopsy normal. EGD with possibe short segment barretts and normal appearing duodenum    04/2019 colonoscopy - report shows normal colon, 2 polyps removed (tubular adenomas), normal ileum.     Imaging:      09/25/2023 CT pelvis  Impression: -Rectal wall thickening with perianal fat stranding extending into the gluteal region   and perineum on the left. No abscess or fistula is seen.   -Findings compatible with pelvic floor dysfunction.   -Bilateral nephrolithiasis.   -Uterine fibroids.   -Aortic atherosclerosis.     CT AP W Contrast 06/23/22: ([facility])   1. No acute intra-abdominal or intrapelvic process.   2. Stable bilateral nonobstructing renal calculi.   3. Aortic Atherosclerosis (ICD10-I70.0) and Emphysema (ICD10-J43.9).     07/26/2020 CT  IMPRESSION:     Bilateral nephrolithiasis with question of mild hydronephrosis.     There is subtle wall thickening and inflammatory stranding involving a Mitesh Rosendahl segment of large bowel that extends from the cecum, ascending colon, transverse colon and portions of the descending colon near the splenic flexure. This likely reflects an inflammatory/infectious process such as colitis.     There is mild nonspecific circumferential thickening of the urinary bladder. Correlation with urinalysis recommended      Prior medications (type, dose, duration, response):  Entocort - recurrent courses/chronic use  Entyvio - lack of response  Remicade - response, but resulting infectious complications including urosepsis  Stelara - initial response (with steroids), loss of response  Vancomycin (initially for treatment of C diff), then with continued improvement of diarrhea  Velsipity - clinical response            Past Medical History:   Past medical history:   Past Medical History:   Diagnosis Date    Arthritis     Barrett esophagus     Colon polyp     Disease of thyroid gland     GERD (gastroesophageal reflux disease)  Hip fracture (CMS-HCC)     History of hip surgery     Hypercholesteremia     Hypertension     Microscopic colitis      Past surgical history:   Past Surgical History:   Procedure Laterality Date    BACK SURGERY      EYE SURGERY  2020?    cataracts    FRACTURE SURGERY  2022    hip replacememt    HIP FRACTURE SURGERY Right     JOINT REPLACEMENT  hip  2022    hip    PR COLONOSCOPY W/BIOPSY SINGLE/MULTIPLE N/A 10/16/2021    Procedure: COLONOSCOPY, FLEXIBLE, PROXIMAL TO SPLENIC FLEXURE; WITH BIOPSY, SINGLE OR MULTIPLE;  Surgeon: Vidal Schwalbe, MD;  Location: [facility];  Service: Gastroenterology    PR COLONOSCOPY W/BIOPSY SINGLE/MULTIPLE N/A 02/06/2023    Procedure: COLONOSCOPY, FLEXIBLE, PROXIMAL TO SPLENIC FLEXURE; WITH BIOPSY, SINGLE OR MULTIPLE;  Surgeon: Monte Fantasia, MD;  Location: [facility];  Service: Gastroenterology    PR UPPER GI ENDOSCOPY,BIOPSY N/A 10/16/2021    Procedure: UGI ENDOSCOPY; WITH BIOPSY, SINGLE OR MULTIPLE;  Surgeon: Vidal Schwalbe, MD;  Location: [facility];  Service: Gastroenterology     Family history:   Family History   Problem Relation Age of Onset    Colon cancer Brother     Diabetes Brother     Ulcerative colitis Son     Intellectual Disability Son     Hearing loss Mother     Cancer Father     Diabetes Maternal Grandfather     Breast cancer Sister      Social history:   Social History     Socioeconomic History    Marital status: Married   Tobacco Use Smoking status: Former     Current packs/day: 0.00     Average packs/day: 0.5 packs/day for 4.0 years (2.0 ttl pk-yrs)     Types: Cigarettes     Quit date: 08/14/1969     Years since quitting: 54.3    Smokeless tobacco: Never   Vaping Use    Vaping status: Never Used   Substance and Sexual Activity    Alcohol use: Not Currently     Alcohol/week: 1.0 standard drink of alcohol     Types: 1 Glasses of wine per week     Comment: every now and then    Drug use: Never    Sexual activity: Not Currently     Partners: Male     Birth control/protection: Abstinence     Social Drivers of Health     Food Insecurity: No Food Insecurity (09/30/2023)    Received from [facility]    Hunger Vital Sign     Worried About Running Out of Food in the Last Year: Never true     Ran Out of Food in the Last Year: Never true   Transportation Needs: No Transportation Needs (09/30/2023)    Received from [facility] - Transportation     Lack of Transportation (Medical): No     Lack of Transportation (Non-Medical): No             Allergies:     Allergies   Allergen Reactions    Bupropion Hives and Other (See Comments)    Infliximab Other (See Comments)     Unknown    Montelukast Other (See Comments)     Reaction:  Makes pt jittery    Reaction:  Makes pt jittery      Reaction:  Makes pt jittery Makes pt jittery      Makes pt jittery    Montelukast Sodium Other (See Comments)     Reaction:  Makes pt jittery  Makes pt jittery      Alendronate Sodium Other (See Comments) and Rash     Reaction:  GI upset   GI upset       Clindamycin Rash    Other Rash    Sulfa (Sulfonamide Antibiotics) Rash             Medications:     Current Outpatient Medications   Medication Sig Dispense Refill    acetaminophen (TYLENOL) 500 MG tablet Take 1 tablet (500 mg total) by mouth.      ascorbic acid, vitamin C, (VITAMIN C) 1000 MG tablet Take 1 tablet (1,000 mg total) by mouth.      aspirin (ECOTRIN) 81 MG tablet Take 1 tablet (81 mg total) by mouth daily. bisoprolol (ZEBETA) 5 MG tablet Take 1 tablet (5 mg total) by mouth daily.      calcium carbonate 1,500 mg (600 mg elem calcium) tablet 1 tab Orally once a day      calcium carbonate-vit D3-min 600 mg calcium- 400 unit Tab Take by mouth.      cetirizine (ZYRTEC) 10 MG tablet Take 1 tablet (10 mg total) by mouth.      cholecalciferol, vitamin D3-50 mcg, 2,000 unit,, 50 mcg (2,000 unit) tablet Take 1 tablet (50 mcg total) by mouth daily.      cinnamon bark 500 mg capsule Take 500 mg by mouth.      coenzyme Q10 200 mg capsule Take 1 capsule (200 mg total) by mouth daily.      colestipol (COLESTID) 1 gram tablet Take 4 tablets (4 g total) by mouth Three (3) times a day. (Patient taking differently: Take 2 tablets (2 g total) by mouth two (2) times a day.) 360 tablet 1    cycloSPORINE (RESTASIS) 0.05 % ophthalmic emulsion Administer 1 drop to both eyes every twelve (12) hours.      denosumab (PROLIA SUBQ) Inject under the skin every six (6) months.      etrasimod (VELSIPITY) 2 mg Tab Take 1 tablet (2 mg) by mouth in the morning. 30 tablet 2    famotidine (PEPCID) 40 MG tablet Take 10 mg by mouth every evening.      flaxseed oil 1,000 mg cap 1 tab Orally Once a day      fluticasone propionate (FLONASE) 50 mcg/actuation nasal spray 2 sprays into each nostril.      Lactobacillus acidophilus 10 billion cell cap Take 1 capsule by mouth daily.      levothyroxine 88 mcg cap Take 1 capsule (88 mcg total) by mouth daily. For 6 days, skip the 7th day      loperamide (IMODIUM) 2 mg capsule TAKE 1 CAPSULE BY MOUTH FOUR TIMES DAILY AS NEEDED FOR DIARRHEA 120 capsule 1    loratadine (CLARITIN) 10 mg tablet Take 1 tablet (10 mg total) by mouth daily.      meclizine (ANTIVERT) 25 mg tablet 1/2-1 pill up to 3 times daily for motion sickness/dizziness      multivitamin therapeutic with minerals (THERA-M) 27-0.4 mg Tab Take 1 tablet by mouth daily.      omega-3 fatty acids-fish oil 360-1,200 mg cap Take 1,200 mg by mouth.      pantoprazole (PROTONIX) 40 MG tablet TAKE 1  TABLET BY MOUTH TWICE DAILY BEFORE BREAKFAST AND BEFORE DINNER 180 tablet 3    simvastatin (ZOCOR) 20 MG tablet Take 1 tablet (20 mg total) by mouth daily before breakfast.      triamcinolone (KENALOG) 0.5 % ointment Apply 1 Application topically Two (2) times a day (at 8am and 12:00).      vancomycin (VANCOCIN) 125 MG capsule TAKE ONE CAPSULE BY MOUTH FOUR TIMES DAILY. (Patient taking differently: Take 1 capsule (125 mg total) by mouth two (2) times a day.) 120 capsule 2     No current facility-administered medications for this visit.             Physical Exam:      BP 147/76  - Pulse 55  - Temp 36.3 ??C (97.3 ??F) (Temporal)  - Ht 165.1 cm (5' 5)  - Wt 49 kg (108 lb)  - BMI 17.97 kg/m??     Wt Readings from Last 6 Encounters:   12/10/23 49 kg (108 lb)   10/31/23 49.1 kg (108 lb 3.2 oz)   10/21/23 49.3 kg (108 lb 9.6 oz)   10/07/23 48.6 kg (107 lb 1.6 oz)   09/30/23 50 kg (110 lb 4.8 oz)   02/06/23 49.9 kg (110 lb)      Constitutional:    Alert, oriented x 3, no acute distress, well nourished, and well hydrated.   Mental Status:    Thought organized, appropriate affect, pleasantly interactive, not anxious appearing.   HEENT:    conjunctiva clear, anicteric   Respiratory: Unlabored breathing.      Cardiac: Euvolemic, regular rate.      Abdomen: Soft, non-distended, NT, no organomegaly or masses.      Perianal/Rectal Exam Not done      Extremities:    No edema, well perfused.                 Labs, Data & Indices:     Lab Review:   Lab Results   Component Value Date    WBC 5.8 12/10/2023    WBC 8.9 07/30/2023    RBC 3.94 (L) 12/10/2023    RBC 4.04 07/30/2023    HGB 12.7 12/10/2023    HGB 13.3 07/30/2023     Lab Results   Component Value Date    AST 28 12/10/2023    AST 24 07/30/2023    ALT 15 12/10/2023    ALT 24 07/30/2023    BUN 11 12/10/2023    BUN 13 07/30/2023    Creatinine Whole Blood, POC 0.7 09/07/2020    Creatinine 0.72 12/10/2023    Creatinine 0.98 07/30/2023    CO2 25.5 12/10/2023    CO2 23 07/30/2023    Albumin 3.7 12/10/2023    Calcium 9.8 12/10/2023    Calcium 9.4 07/30/2023     No results found for: TSH   ............................................................................................................................................Marland Kitchen          Assessment & Recommendations:     Traci Bennett is a 83 y.o. female who presents for follow up of indeterminate colitis (with colon thickening on CT and now chronic colitis changes on biopsy), lack of response to entyvio, exacerbation of symptoms with elevated fecal calpro, and no benefit on vedo transitioned to remicade monotherapy and did have clinical response. After infusion 06/22/22 patient with hospital admission for urosepsis. Discontinued remicade due to infectious complication, and maintained on mesalamine. Flare, initiated on stelara and concomitant c diff treated with vanco. Initial response, then exacerbation of symptoms. Delayed Velsipity start  due to insurance and perirectal abscess but has now been on 1 month of therapy and has seen improvement of symptoms. After further discussion, pt has been taking her Colestid in the morning with her Bisoprolol, Velsipity and vancomycin. Since Colestid is a binder, we feel that she is not getting the full benefit of these medications and is advised to take Colestid prior to lunch and then dinner. She should see an improvement in her BP along with better stool consistency. She is advised to increase Imodium to 2 tabs prior to bedtime.       PLAN:    Indeterminate colitis/microscopic colitis -  will continue Velsipity and Vancomycin/ She will separate Colestid from her other medications, taking prior to lunch and dinner. Recent flex sign exam biopsy confirmed microscopic colitis and only notable for external/internal hemorrhoids. Will repeat Calprotectin in 1 month to assess for biologic remission on current therapy ordered. She has had a clinical response to the velsipity.  Perirectal abscess - healed  Incontinence - has resolved at this time  Reflux - will cont PPI BID, elevated head of bed, H2 blocker (pepcid) at night  Hypertension - monitor her blood pressure at home, the elevation is likely associated with the Velspitiy, but with her colestid timing, she may not be getting the full effect of her BP meds. Will separate timing of this, and have her follow up with her Primary care physician within the month for blood pressure check.  Recommend updating Tdap vaccine  Return in 3 months to assess response of vancomycin and S1P therapy , will schedule virtual visit           Patient seen and discussed with Dr. Jacqulyn Bath.         Victorino Sparrow  IBD Nurse Practitioner      I saw the patient as part of a medically necessary shared visit with the APP. I personally performed the substantive portion of this visit which included Time/MDM: 35 minutes spent face-to-face and non-face-to-face in the care of this patient, which includes all pre, intra, and post visit time on the date of service which was specific to E/M and does not include any procedures that may have been performed.                Kayvon Mo D. Idara [facility] MD, MPH  Professor of Medicine  [facility] [place]  Division of Gastroenterology and Hepatology        --------------------------------------------  [facility]  Multidisciplinary Inflammatory Bowel Disease Clinic

## 2023-12-11 NOTE — Addendum Note (Signed)
Addended by: Mikey Kirschner B on: 12/11/2023 11:02 AM   Modules accepted: Orders

## 2023-12-16 ENCOUNTER — Ambulatory Visit: Payer: PPO | Admitting: Rehabilitative and Restorative Service Providers"

## 2023-12-24 ENCOUNTER — Ambulatory Visit: Payer: PPO | Attending: Sports Medicine

## 2023-12-24 DIAGNOSIS — R252 Cramp and spasm: Secondary | ICD-10-CM | POA: Diagnosis not present

## 2023-12-24 DIAGNOSIS — R262 Difficulty in walking, not elsewhere classified: Secondary | ICD-10-CM | POA: Insufficient documentation

## 2023-12-24 DIAGNOSIS — M5459 Other low back pain: Secondary | ICD-10-CM | POA: Diagnosis not present

## 2023-12-24 DIAGNOSIS — M25562 Pain in left knee: Secondary | ICD-10-CM | POA: Insufficient documentation

## 2023-12-24 DIAGNOSIS — M25551 Pain in right hip: Secondary | ICD-10-CM | POA: Insufficient documentation

## 2023-12-24 DIAGNOSIS — M25561 Pain in right knee: Secondary | ICD-10-CM | POA: Diagnosis not present

## 2023-12-24 DIAGNOSIS — G8929 Other chronic pain: Secondary | ICD-10-CM | POA: Insufficient documentation

## 2023-12-24 DIAGNOSIS — M542 Cervicalgia: Secondary | ICD-10-CM | POA: Insufficient documentation

## 2023-12-24 DIAGNOSIS — M25552 Pain in left hip: Secondary | ICD-10-CM | POA: Diagnosis not present

## 2023-12-24 DIAGNOSIS — R293 Abnormal posture: Secondary | ICD-10-CM | POA: Diagnosis not present

## 2023-12-24 DIAGNOSIS — M6281 Muscle weakness (generalized): Secondary | ICD-10-CM | POA: Diagnosis not present

## 2023-12-24 NOTE — Therapy (Signed)
 OUTPATIENT PHYSICAL THERAPY TREATMENT NOTE   Patient Name: Debbie Houston MRN: [id number] DOB:23-Jul-1941, 83 y.o., female Today's Date: 12/25/2023  END OF SESSION:  PT End of Session - 12/24/23 1156     Visit Number 3    Date for PT Re-Evaluation 02/18/24    Authorization Type Medicare    Progress Note Due on Visit 10    PT Start Time 1148    PT Stop Time 1233    PT Time Calculation (min) 45 min    Activity Tolerance Patient tolerated treatment well    Behavior During Therapy WFL for tasks assessed/performed             Past Medical History:  Diagnosis Date   Arthritis    Benign labile hypertension    Cancer (HCC)    skin cancer on face   Cellulitis    Colitis    DJD (degenerative joint disease)    Endometrial polyp    GERD (gastroesophageal reflux disease)    History of basal cell carcinoma excision    History of esophagitis    HOH (hard of hearing)    right ear better than left  -- wears no aides   Hyperlipidemia    Hypothyroidism    Neuromuscular disorder (HCC)    neuropathy in feet   Osteopenia    Prediabetes    S/P right hip fracture 08/17/2020   Sepsis (HCC) 2023   per patient   Vitamin D  deficiency    Wears glasses    Past Surgical History:  Procedure Laterality Date   BACK SURGERY  2020   BIOPSY  09/27/2023   Procedure: BIOPSY;  Surgeon: Rosalie Kitchens, MD;  Location: [facility] ENDOSCOPY;  Service: Gastroenterology;;   CATARACT EXTRACTION, BILATERAL Bilateral 2020   CONVERSION TO TOTAL HIP Right 01/22/2022   Procedure: CONVERSION TO TOTAL HIP;  Surgeon: Edna Toribio LABOR, MD;  Location: [facility]S;  Service: Orthopedics;  Laterality: Right;   DILATATION & CURETTAGE/HYSTEROSCOPY WITH MYOSURE N/A 10/27/2014   Procedure: DILATATION & CURETTAGE/HYSTEROSCOPY WITH MYOSURE;  Surgeon: Norleen GORMAN Skill, MD;  Location: [facility];  Service: Gynecology;  Laterality: N/A;   DILATION AND CURETTAGE OF UTERUS     FLEXIBLE SIGMOIDOSCOPY N/A 09/27/2023   Procedure:  FLEXIBLE SIGMOIDOSCOPY;  Surgeon: Rosalie Kitchens, MD;  Location: [facility] ENDOSCOPY;  Service: Gastroenterology;  Laterality: N/A;   HIP SURGERY Right 04/2020   rod and screw in right hip    MOHS SURGERY  2005   LEFT NASAL BRIDGE FOR BASAL CELL   TONSILLECTOMY  age 46   Patient Active Problem List   Diagnosis Date Noted   Hyponatremia 09/26/2023   Cellulitis of left buttock 09/26/2023   Proctitis 09/25/2023   History of microscopic colitis 09/25/2023   GERD (gastroesophageal reflux disease) 09/25/2023   Sepsis secondary to UTI (HCC) 06/24/2022   Extensor tenosynovitis of right wrist 06/23/2022   Thrombocytopenia (HCC) 06/23/2022   Prediabetes 06/23/2022   Intertrochanteric fracture of right femur, closed, with nonunion, subsequent encounter 01/22/2022   History of ulcerative colitis 07/10/2021   Complete right bundle branch block (RBBB) 05/15/2021   Body mass index (BMI) of 19.0-19.9 in adult 05/12/2021   Bilateral nephrolithiasis 09/21/2020   Senile purpura (HCC) 08/17/2020   Bilateral temporomandibular joint pain 06/15/2020   Spondylolisthesis at L4-L5 level 11/25/2018   Essential hypertension    Hyperlipidemia    Hypothyroidism    GERD    Asthma    DJD (degenerative joint disease)    Vitamin  D deficiency    Osteoporosis with current pathological fracture    Allergy     PCP:   Tonita Fallow, MD    REFERRING PROVIDER: Orpha Asberry RAMAN, MD  REFERRING DIAG:  Diagnosis  M17.0 (ICD-10-CM) - Bilateral primary osteoarthritis of knee  R29.898 (ICD-10-CM) - Hip weakness  M54.50 (ICD-10-CM) - Low back pain    THERAPY DIAG:  Chronic pain of both knees  Difficulty in walking, not elsewhere classified  Muscle weakness (generalized)  Cramp and spasm  Abnormal posture  Pain in right hip  Pain in left hip  Other low back pain  Rationale for Evaluation and Treatment: Rehabilitation  ONSET DATE: 12/03/2023  SUBJECTIVE:   SUBJECTIVE STATEMENT: Patient reports that  she is doing better with her exercises. No questions about her exercises.    PERTINENT HISTORY: Right hip fx from fall 2 years ago, initial ORIF, then subsequent THA.   PAIN:  12/24/23: Are you having pain? Yes: NPRS scale: 3-4/10 Pain location: knees, hips, low back  Pain description: aching Aggravating factors: prolonged sitting or standing Relieving factors: Tylenol  arthritis  PRECAUTIONS: None  RED FLAGS: Bowel or bladder incontinence: Yes: age related    WEIGHT BEARING RESTRICTIONS: No  FALLS:  Has patient fallen in last 6 months? No  LIVING ENVIRONMENT: Lives with: lives with their family and lives with their spouse Lives in: House/apartment Stairs: Yes: Internal: 12 steps; can reach both and External: 4 steps; can reach both Has following equipment at home: None  OCCUPATION: retired  PLOF: Independent, Independent with basic ADLs, Independent with household mobility without device, Independent with community mobility without device, Independent with homemaking with ambulation, Independent with gait, Independent with transfers, Vocation/Vocational requirements: retired, and Leisure: read, watch TV, go out with friends and go to the beach  PATIENT GOALS: to be able to walk and do routine daily activities with manageable pain  NEXT MD VISIT: na  OBJECTIVE:  Note: Objective measures were completed at Evaluation unless otherwise noted.  DIAGNOSTIC FINDINGS: na  PATIENT SURVEYS:  Eval:  LEFS 41/80  COGNITION: Overall cognitive status: Within functional limits for tasks assessed     SENSATION: WFL   MUSCLE LENGTH: Hamstrings: Right 40 deg; Left 45 deg Thomas test: Right pos; Left pos  POSTURE: rounded shoulders and forward head  PALPATION: Palpable crepitus bilateral knees on seated flexion / extension  LOWER EXTREMITY ROM:  WFL  LOWER EXTREMITY MMT:  Bilateral hips generally 4-/5, bilateral knees generally 4/5, ankles 5/5  LOWER EXTREMITY SPECIAL  TESTS:  Hip special tests: Belvie (FABER) test: positive  and Thomas test: positive  Knee special tests: did not need this one.  FUNCTIONAL TESTS:  Eval: 5 times sit to stand: 16.49 sec Timed up and go (TUG): 12.18  1/202/2024: 3 minute walk test:  529 ft with RPE of 2-3/10  GAIT: Distance walked: 30 feet Assistive device utilized: None Level of assistance: Complete Independence Comments: antalgic start up then short step length with limited heel strike  TODAY'S TREATMENT  DATE: 12/24/2023 Nustep level 3, 8 min with PT present to discuss status Seated hamstring stretch 2x20 sec bilat Sit to stand x 10 without UE support Seated piriformis stretch 2x20 sec bilat Seated clam with yellow loop x 20 Seated with 2 1/2# ankle weights:  LAQ and marching.  2x10 each bilat Seated hip adduction/pelvic floor ball squeeze 2x10 3 minute walk test:  529 ft with RPE of 2-3/10  DATE: 12/09/2023 Nustep level 3, x6 min with PT present to discuss status Seated hamstring stretch 2x20 sec bilat Seated piriformis stretch 2x20 sec bilat Seated with 2# ankle weights:  LAQ and marching.  2x10 each bilat Seated hip adduction/pelvic floor ball squeeze 2x10 Seated hip abduction clamshells with green tband 2x10 3 minute walk test:  529 ft with RPE of 2-3/10   DATE: 12/05/23 Initial eval completed and initiated HEP    PATIENT EDUCATION:  Education details: Initiated HEP Person educated: Patient Education method: Programmer, Multimedia, Facilities Manager, Verbal cues, and Handouts Education comprehension: verbalized understanding, returned demonstration, and verbal cues required  HOME EXERCISE PROGRAM: Access Code: [email or web address] URL: [email or web address] Date: 12/09/2023 Prepared by: Jarrell Menke  Exercises - Standing Quad Stretch with Table and Chair Support  - 1 x daily -  7 x weekly - 1 sets - 3 reps - 30 sec hold - Seated Piriformis Stretch with Trunk Bend  - 1 x daily - 7 x weekly - 3 reps - 30 sec hold - Seated Piriformis Stretch  - 1 x daily - 7 x weekly - 2 reps - 20 sec hold - Seated Hamstring Stretch  - 1 x daily - 7 x weekly - 2 reps - 20 sec hold - Seated Long Arc Quad  - 1 x daily - 7 x weekly - 2 sets - 10 reps - Seated Pelvic Floor Contraction with Isometric Hip Adduction  - 1 x daily - 7 x weekly - 2 sets - 10 reps - Seated Hip Abduction with Resistance  - 1 x daily - 7 x weekly - 2 sets - 10 reps  ASSESSMENT:  CLINICAL IMPRESSION: Ms Koerber is progressing appropriately.  She was able to complete all tasks today with minimal fatigue.  She feels like she is able to do her initial HEP independently.  We added more isolated hip exercises today to assist with strength and stability.  We also emphasized stretching the quads and IT band.  We attempted prone quad stretch.  She did ok with this but would    OBJECTIVE IMPAIRMENTS: Abnormal gait, decreased balance, decreased coordination, decreased mobility, difficulty walking, decreased ROM, decreased strength, increased fascial restrictions, increased muscle spasms, impaired flexibility, postural dysfunction, and pain.   ACTIVITY LIMITATIONS: carrying, lifting, bending, sitting, standing, squatting, sleeping, stairs, transfers, bed mobility, continence, bathing, toileting, dressing, hygiene/grooming, and caring for others  PARTICIPATION LIMITATIONS: meal prep, cleaning, laundry, driving, shopping, community activity, yard work, and church  PERSONAL FACTORS: Age, Fitness, Time since onset of injury/illness/exacerbation, and 3+ comorbidities: previous back surgery, prior hip surgery x 2, Htn, Osteopenia  are also affecting patient's functional outcome.   REHAB POTENTIAL: Fair multiple former orthopedic issues and age  CLINICAL DECISION MAKING: Evolving/moderate complexity  EVALUATION COMPLEXITY:  Moderate   GOALS: Goals reviewed with patient? Yes  SHORT TERM GOALS: Target date: 01/02/2024  Pain report to be no greater than 4/10  Baseline: Goal status: Ongoing  2.  Patient will be independent with initial HEP  Baseline:  Goal status: Ongoing   LONG TERM GOALS:  Target date: 01/30/2024   Patient to report pain no greater than 2/10  Baseline:  Goal status: INITIAL  2.  Patient to be independent with advanced HEP  Baseline:  Goal status: INITIAL  3.  Patient to be able to bend, stoop and squat with pain no greater than 2/10  Baseline:  Goal status: INITIAL  4.  Patient to be able to ascend and descend steps without pain or no greater than 2/10  Baseline:  Goal status: INITIAL  5.  Patient to be able to stand or walk for at least 15 min without leg pain  Baseline:  Goal status: INITIAL  6.  LEFS score to improve by 2-4 points Baseline:  Goal status: INITIAL   PLAN:  PT FREQUENCY: 1-2x/week  PT DURATION: 8 weeks  PLANNED INTERVENTIONS: 97110-Therapeutic exercises, 97530- Therapeutic activity, 97112- Neuromuscular re-education, 97535- Self Care, 02859- Manual therapy, [phone]- Gait training, [id number]- Aquatic Therapy, 97014- Electrical stimulation (unattended), [phone]- Electrical stimulation (manual), 97016- Vasopneumatic device, [id number]- Ultrasound, [id number]- Ionotophoresis 4mg /ml Dexamethasone , Patient/Family education, Balance training, Stair training, Taping, Dry Needling, Joint mobilization, Spinal mobilization, Compression bandaging, Vestibular training, Visual/preceptual remediation/compensation, DME instructions, Cryotherapy, and Moist heat  PLAN FOR NEXT SESSION: Review HEP, Nustep, begin quad rehab   [place], PT, DPT 12/25/23, 8:28 AM  [facility] [address] [place], [place] [address] Phone # [phone] Fax [phone]

## 2023-12-25 DIAGNOSIS — M170 Bilateral primary osteoarthritis of knee: Secondary | ICD-10-CM | POA: Diagnosis not present

## 2023-12-25 DIAGNOSIS — M545 Low back pain, unspecified: Secondary | ICD-10-CM | POA: Diagnosis not present

## 2023-12-25 DIAGNOSIS — M25551 Pain in right hip: Secondary | ICD-10-CM | POA: Diagnosis not present

## 2023-12-25 NOTE — Unmapped (Signed)
Traci Bennett has been contacted in regards to their refill of Velsipity. At this time, they have declined refill due to medication being on hold due to side effects. She is planning to restart in a week and has 30 tablets on hand. Refill assessment call date has been updated per the patient's request.

## 2023-12-26 ENCOUNTER — Ambulatory Visit: Payer: BLUE CROSS/BLUE SHIELD | Admitting: Internal Medicine

## 2024-01-01 ENCOUNTER — Encounter: Payer: Self-pay | Admitting: Rehabilitation

## 2024-01-01 ENCOUNTER — Ambulatory Visit: Payer: PPO

## 2024-01-01 ENCOUNTER — Ambulatory Visit: Payer: PPO | Admitting: Rehabilitation

## 2024-01-01 DIAGNOSIS — G8929 Other chronic pain: Secondary | ICD-10-CM

## 2024-01-01 DIAGNOSIS — R262 Difficulty in walking, not elsewhere classified: Secondary | ICD-10-CM

## 2024-01-01 DIAGNOSIS — R252 Cramp and spasm: Secondary | ICD-10-CM

## 2024-01-01 DIAGNOSIS — M6281 Muscle weakness (generalized): Secondary | ICD-10-CM

## 2024-01-01 DIAGNOSIS — M25561 Pain in right knee: Secondary | ICD-10-CM | POA: Diagnosis not present

## 2024-01-01 DIAGNOSIS — M25551 Pain in right hip: Secondary | ICD-10-CM

## 2024-01-01 DIAGNOSIS — R293 Abnormal posture: Secondary | ICD-10-CM

## 2024-01-01 DIAGNOSIS — M25552 Pain in left hip: Secondary | ICD-10-CM

## 2024-01-01 DIAGNOSIS — M5459 Other low back pain: Secondary | ICD-10-CM

## 2024-01-01 NOTE — Therapy (Signed)
OUTPATIENT PHYSICAL THERAPY TREATMENT NOTE   Patient Name: Debbie Houston MRN: [id number] DOB:Sep 07, 1941, 83 y.o., female Today's Date: 01/01/2024  END OF SESSION:    Past Medical History:  Diagnosis Date   Arthritis    Benign labile hypertension    Cancer (HCC)    skin cancer on face   Cellulitis    Colitis    DJD (degenerative joint disease)    Endometrial polyp    GERD (gastroesophageal reflux disease)    History of basal cell carcinoma excision    History of esophagitis    HOH (hard of hearing)    right ear better than left  -- wears no aides   Hyperlipidemia    Hypothyroidism    Neuromuscular disorder (HCC)    neuropathy in feet   Osteopenia    Prediabetes    S/P right hip fracture 08/17/2020   Sepsis (HCC) 2023   per patient   Vitamin D deficiency    Wears glasses    Past Surgical History:  Procedure Laterality Date   BACK SURGERY  2020   BIOPSY  09/27/2023   Procedure: BIOPSY;  Surgeon: Vida Rigger, MD;  Location: [facility] ENDOSCOPY;  Service: Gastroenterology;;   CATARACT EXTRACTION, BILATERAL Bilateral 2020   CONVERSION TO TOTAL HIP Right 01/22/2022   Procedure: CONVERSION TO TOTAL HIP;  Surgeon: Joen Laura, MD;  Location: [facility];  Service: Orthopedics;  Laterality: Right;   DILATATION & CURETTAGE/HYSTEROSCOPY WITH MYOSURE N/A 10/27/2014   Procedure: DILATATION & CURETTAGE/HYSTEROSCOPY WITH MYOSURE;  Surgeon: Juluis Mire, MD;  Location: [facility];  Service: Gynecology;  Laterality: N/A;   DILATION AND CURETTAGE OF UTERUS     FLEXIBLE SIGMOIDOSCOPY N/A 09/27/2023   Procedure: FLEXIBLE SIGMOIDOSCOPY;  Surgeon: Vida Rigger, MD;  Location: [facility] ENDOSCOPY;  Service: Gastroenterology;  Laterality: N/A;   HIP SURGERY Right 04/2020   rod and screw in right hip    MOHS SURGERY  2005   LEFT NASAL BRIDGE FOR BASAL CELL   TONSILLECTOMY  age 24   Patient Active Problem List   Diagnosis Date Noted   Hyponatremia 09/26/2023   Cellulitis of left  buttock 09/26/2023   Proctitis 09/25/2023   History of microscopic colitis 09/25/2023   GERD (gastroesophageal reflux disease) 09/25/2023   Sepsis secondary to UTI (HCC) 06/24/2022   Extensor tenosynovitis of right wrist 06/23/2022   Thrombocytopenia (HCC) 06/23/2022   Prediabetes 06/23/2022   Intertrochanteric fracture of right femur, closed, with nonunion, subsequent encounter 01/22/2022   History of ulcerative colitis 07/10/2021   Complete right bundle branch block (RBBB) 05/15/2021   Body mass index (BMI) of 19.0-19.9 in adult 05/12/2021   Bilateral nephrolithiasis 09/21/2020   Senile purpura (HCC) 08/17/2020   Bilateral temporomandibular joint pain 06/15/2020   Spondylolisthesis at L4-L5 level 11/25/2018   Essential hypertension    Hyperlipidemia    Hypothyroidism    GERD    Asthma    DJD (degenerative joint disease)    Vitamin D deficiency    Osteoporosis with current pathological fracture    Allergy     PCP:   Lucky Cowboy, MD    REFERRING PROVIDER: Melina Fiddler, MD  REFERRING DIAG:  Diagnosis  M17.0 (ICD-10-CM) - Bilateral primary osteoarthritis of knee  R29.898 (ICD-10-CM) - Hip weakness  M54.50 (ICD-10-CM) - Low back pain    THERAPY DIAG:  No diagnosis found.  Rationale for Evaluation and Treatment: Rehabilitation  ONSET DATE: 12/03/2023  SUBJECTIVE:   SUBJECTIVE STATEMENT: I almost didn't  come today     PERTINENT HISTORY: Right hip fx from fall 2 years ago, initial ORIF, then subsequent THA.   PAIN:  Are you having pain? Yes: NPRS scale: 5/10 Pain location: knees, hips, low back  Pain description: aching Aggravating factors: prolonged sitting or standing Relieving factors: Tylenol arthritis  PRECAUTIONS: None  RED FLAGS: Bowel or bladder incontinence: Yes: age related    WEIGHT BEARING RESTRICTIONS: No  FALLS:  Has patient fallen in last 6 months? No  LIVING ENVIRONMENT: Lives with: lives with their family and lives with  their spouse Lives in: House/apartment Stairs: Yes: Internal: 12 steps; can reach both and External: 4 steps; can reach both Has following equipment at home: None  OCCUPATION: retired  PLOF: Independent, Independent with basic ADLs, Independent with household mobility without device, Independent with community mobility without device, Independent with homemaking with ambulation, Independent with gait, Independent with transfers, Vocation/Vocational requirements: retired, and Leisure: read, watch TV, go out with friends and go to the beach  PATIENT GOALS: to be able to walk and do routine daily activities with manageable pain  NEXT MD VISIT: na  OBJECTIVE:  Note: Objective measures were completed at Evaluation unless otherwise noted.  DIAGNOSTIC FINDINGS: na  PATIENT SURVEYS:  Eval:  LEFS 41/80  COGNITION: Overall cognitive status: Within functional limits for tasks assessed     SENSATION: WFL   MUSCLE LENGTH: Hamstrings: Right 40 deg; Left 45 deg Thomas test: Right pos; Left pos  POSTURE: rounded shoulders and forward head  PALPATION: Palpable crepitus bilateral knees on seated flexion / extension  LOWER EXTREMITY ROM:  WFL  LOWER EXTREMITY MMT:  Bilateral hips generally 4-/5, bilateral knees generally 4/5, ankles 5/5  LOWER EXTREMITY SPECIAL TESTS:  Hip special tests: Luisa Hart (FABER) test: positive  and Thomas test: positive  Knee special tests: did not need this one.  FUNCTIONAL TESTS:  Eval: 5 times sit to stand: 16.49 sec Timed up and go (TUG): 12.18  1/202/2024: 3 minute walk test:  529 ft with RPE of 2-3/10  GAIT: Distance walked: 30 feet Assistive device utilized: None Level of assistance: Complete Independence Comments: antalgic start up then short step length with limited heel strike                                                                                                                                TODAY'S TREATMENT DATE:  01/01/2024 Nustep level 3, 8 min with PT present to discuss status Seated hamstring stretch 2x20 sec bilat Sit to stand x 10 without UE support Seated piriformis stretch 2x20 sec bilat Seated clam with yellow loop x 20 Seated with 2 1/2# ankle weights:  LAQ and marching.  2x10 each bilat Seated hip adduction/pelvic floor ball squeeze 2x10 Standing in parallel bars calf stretch 2x20" bil   DATE: 12/24/2023 Nustep level 3, 8 min with PT present to discuss status Seated hamstring stretch 2x20 sec bilat Sit to stand  x 10 without UE support Seated piriformis stretch 2x20 sec bilat Seated clam with yellow loop x 20 Seated with 2 1/2# ankle weights:  LAQ and marching.  2x10 each bilat Seated hip adduction/pelvic floor ball squeeze 2x10 3 minute walk test:  529 ft with RPE of 2-3/10  DATE: 12/09/2023 Nustep level 3, x6 min with PT present to discuss status Seated hamstring stretch 2x20 sec bilat Seated piriformis stretch 2x20 sec bilat Seated with 2# ankle weights:  LAQ and marching.  2x10 each bilat Seated hip adduction/pelvic floor ball squeeze 2x10 Seated hip abduction clamshells with green tband 2x10 3 minute walk test:  529 ft with RPE of 2-3/10   DATE: 12/05/23 Initial eval completed and initiated HEP    PATIENT EDUCATION:  Education details: Initiated HEP Person educated: Patient Education method: Programmer, multimedia, Facilities manager, Verbal cues, and Handouts Education comprehension: verbalized understanding, returned demonstration, and verbal cues required  HOME EXERCISE PROGRAM: Access Code: [email or web address] URL: [email or web address] Date: 12/09/2023 Prepared by: Clydie Braun Menke  Exercises - Standing Quad Stretch with Table and Chair Support  - 1 x daily - 7 x weekly - 1 sets - 3 reps - 30 sec hold - Seated Piriformis Stretch with Trunk Bend  - 1 x daily - 7 x weekly - 3 reps - 30 sec hold - Seated Piriformis Stretch  - 1 x daily - 7 x weekly - 2 reps - 20 sec hold - Seated  Hamstring Stretch  - 1 x daily - 7 x weekly - 2 reps - 20 sec hold - Seated Long Arc Quad  - 1 x daily - 7 x weekly - 2 sets - 10 reps - Seated Pelvic Floor Contraction with Isometric Hip Adduction  - 1 x daily - 7 x weekly - 2 sets - 10 reps - Seated Hip Abduction with Resistance  - 1 x daily - 7 x weekly - 2 sets - 10 reps  ASSESSMENT:  CLINICAL IMPRESSION: Notes increase in pain with TE - mainly the Rt hip.  Pt is considering getting her hip checked. Pt also has a referral for her neck pain.  Discussed how she could give it to the front desk and they may have her finish the knee PT or may have her add the neck - but to talk to her therapist.    OBJECTIVE IMPAIRMENTS: Abnormal gait, decreased balance, decreased coordination, decreased mobility, difficulty walking, decreased ROM, decreased strength, increased fascial restrictions, increased muscle spasms, impaired flexibility, postural dysfunction, and pain.   ACTIVITY LIMITATIONS: carrying, lifting, bending, sitting, standing, squatting, sleeping, stairs, transfers, bed mobility, continence, bathing, toileting, dressing, hygiene/grooming, and caring for others  PARTICIPATION LIMITATIONS: meal prep, cleaning, laundry, driving, shopping, community activity, yard work, and church  PERSONAL FACTORS: Age, Fitness, Time since onset of injury/illness/exacerbation, and 3+ comorbidities: previous back surgery, prior hip surgery x 2, Htn, Osteopenia  are also affecting patient's functional outcome.   REHAB POTENTIAL: Fair multiple former orthopedic issues and age  CLINICAL DECISION MAKING: Evolving/moderate complexity  EVALUATION COMPLEXITY: Moderate   GOALS: Goals reviewed with patient? Yes  SHORT TERM GOALS: Target date: 01/02/2024  Pain report to be no greater than 4/10  Baseline: Goal status: Ongoing  2.  Patient will be independent with initial HEP  Baseline:  Goal status: Ongoing   LONG TERM GOALS: Target date:  01/30/2024   Patient to report pain no greater than 2/10  Baseline:  Goal status: INITIAL  2.  Patient to be independent with advanced HEP  Baseline:  Goal status: INITIAL  3.  Patient to be able to bend, stoop and squat with pain no greater than 2/10  Baseline:  Goal status: INITIAL  4.  Patient to be able to ascend and descend steps without pain or no greater than 2/10  Baseline:  Goal status: INITIAL  5.  Patient to be able to stand or walk for at least 15 min without leg pain  Baseline:  Goal status: INITIAL  6.  LEFS score to improve by 2-4 points Baseline:  Goal status: INITIAL   PLAN:  PT FREQUENCY: 1-2x/week  PT DURATION: 8 weeks  PLANNED INTERVENTIONS: 97110-Therapeutic exercises, 97530- Therapeutic activity, 97112- Neuromuscular re-education, 97535- Self Care, 16109- Manual therapy, [phone]- Gait training, [id number]- Aquatic Therapy, 97014- Electrical stimulation (unattended), [phone]- Electrical stimulation (manual), 97016- Vasopneumatic device, [id number]- Ultrasound, [id number]- Ionotophoresis 4mg /ml Dexamethasone, Patient/Family education, Balance training, Stair training, Taping, Dry Needling, Joint mobilization, Spinal mobilization, Compression bandaging, Vestibular training, Visual/preceptual remediation/compensation, DME instructions, Cryotherapy, and Moist heat  PLAN FOR NEXT SESSION: Review HEP, Nustep, begin quad rehab   [place], PT, DPT 01/01/24, 10:28 AM  [facility] [address] [place], [place] [address] Phone # [phone] Fax [phone]

## 2024-01-06 DIAGNOSIS — E039 Hypothyroidism, unspecified: Secondary | ICD-10-CM | POA: Diagnosis not present

## 2024-01-06 DIAGNOSIS — E782 Mixed hyperlipidemia: Secondary | ICD-10-CM | POA: Diagnosis not present

## 2024-01-06 DIAGNOSIS — Z0000 Encounter for general adult medical examination without abnormal findings: Secondary | ICD-10-CM | POA: Diagnosis not present

## 2024-01-06 DIAGNOSIS — R7301 Impaired fasting glucose: Secondary | ICD-10-CM | POA: Diagnosis not present

## 2024-01-06 DIAGNOSIS — Z8619 Personal history of other infectious and parasitic diseases: Secondary | ICD-10-CM | POA: Diagnosis not present

## 2024-01-06 DIAGNOSIS — K219 Gastro-esophageal reflux disease without esophagitis: Secondary | ICD-10-CM | POA: Diagnosis not present

## 2024-01-07 ENCOUNTER — Ambulatory Visit: Payer: PPO

## 2024-01-07 DIAGNOSIS — M5459 Other low back pain: Secondary | ICD-10-CM

## 2024-01-07 DIAGNOSIS — M25561 Pain in right knee: Secondary | ICD-10-CM | POA: Diagnosis not present

## 2024-01-07 DIAGNOSIS — M25552 Pain in left hip: Secondary | ICD-10-CM

## 2024-01-07 DIAGNOSIS — R262 Difficulty in walking, not elsewhere classified: Secondary | ICD-10-CM

## 2024-01-07 DIAGNOSIS — G8929 Other chronic pain: Secondary | ICD-10-CM

## 2024-01-07 DIAGNOSIS — R293 Abnormal posture: Secondary | ICD-10-CM

## 2024-01-07 DIAGNOSIS — M542 Cervicalgia: Secondary | ICD-10-CM

## 2024-01-07 DIAGNOSIS — M25551 Pain in right hip: Secondary | ICD-10-CM

## 2024-01-07 DIAGNOSIS — M6281 Muscle weakness (generalized): Secondary | ICD-10-CM

## 2024-01-07 DIAGNOSIS — R252 Cramp and spasm: Secondary | ICD-10-CM

## 2024-01-07 NOTE — Therapy (Unsigned)
OUTPATIENT PHYSICAL THERAPY TREATMENT NOTE   Patient Name: Debbie Houston MRN: [id number] DOB:[birth year], 83 y.o., female Today's Date: 01/08/2024  END OF SESSION:  PT End of Session - 01/07/24 1407     Visit Number 5    Date for PT Re-Evaluation 02/18/24    Authorization Type Medicare    Progress Note Due on Visit 10    PT Start Time 1405    PT Stop Time 1445    PT Time Calculation (min) 40 min    Activity Tolerance Patient tolerated treatment well    Behavior During Therapy WFL for tasks assessed/performed              Past Medical History:  Diagnosis Date   Arthritis    Benign labile hypertension    Cancer (HCC)    skin cancer on face   Cellulitis    Colitis    DJD (degenerative joint disease)    Endometrial polyp    GERD (gastroesophageal reflux disease)    History of basal cell carcinoma excision    History of esophagitis    HOH (hard of hearing)    right ear better than left  -- wears no aides   Hyperlipidemia    Hypothyroidism    Neuromuscular disorder (HCC)    neuropathy in feet   Osteopenia    Prediabetes    S/P right hip fracture 08/17/2020   Sepsis (HCC) 2023   per patient   Vitamin D deficiency    Wears glasses    Past Surgical History:  Procedure Laterality Date   BACK SURGERY  2020   BIOPSY  09/27/2023   Procedure: BIOPSY;  Surgeon: Vida Rigger, MD;  Location: [facility] ENDOSCOPY;  Service: Gastroenterology;;   CATARACT EXTRACTION, BILATERAL Bilateral 2020   CONVERSION TO TOTAL HIP Right 01/22/2022   Procedure: CONVERSION TO TOTAL HIP;  Surgeon: Joen Laura, MD;  Location: [facility] ORS;  Service: Orthopedics;  Laterality: Right;   DILATATION & CURETTAGE/HYSTEROSCOPY WITH MYOSURE N/A 10/27/2014   Procedure: DILATATION & CURETTAGE/HYSTEROSCOPY WITH MYOSURE;  Surgeon: Juluis Mire, MD;  Location: [facility];  Service: Gynecology;  Laterality: N/A;   DILATION AND CURETTAGE OF UTERUS     FLEXIBLE SIGMOIDOSCOPY N/A 09/27/2023    Procedure: FLEXIBLE SIGMOIDOSCOPY;  Surgeon: Vida Rigger, MD;  Location: [facility] ENDOSCOPY;  Service: Gastroenterology;  Laterality: N/A;   HIP SURGERY Right 04/2020   rod and screw in right hip    MOHS SURGERY  2005   LEFT NASAL BRIDGE FOR BASAL CELL   TONSILLECTOMY  age 62   Patient Active Problem List   Diagnosis Date Noted   Hyponatremia 09/26/2023   Cellulitis of left buttock 09/26/2023   Proctitis 09/25/2023   History of microscopic colitis 09/25/2023   GERD (gastroesophageal reflux disease) 09/25/2023   Sepsis secondary to UTI (HCC) 06/24/2022   Extensor tenosynovitis of right wrist 06/23/2022   Thrombocytopenia (HCC) 06/23/2022   Prediabetes 06/23/2022   Intertrochanteric fracture of right femur, closed, with nonunion, subsequent encounter 01/22/2022   History of ulcerative colitis 07/10/2021   Complete right bundle branch block (RBBB) 05/15/2021   Body mass index (BMI) of 19.0-19.9 in adult 05/12/2021   Bilateral nephrolithiasis 09/21/2020   Senile purpura (HCC) 08/17/2020   Bilateral temporomandibular joint pain 06/15/2020   Spondylolisthesis at L4-L5 level 11/25/2018   Essential hypertension    Hyperlipidemia    Hypothyroidism    GERD    Asthma    DJD (degenerative joint disease)  Vitamin D deficiency    Osteoporosis with current pathological fracture    Allergy     PCP:   Lucky Cowboy, MD    REFERRING PROVIDER: Melina Fiddler, MD  REFERRING DIAG:  Diagnosis  M17.0 (ICD-10-CM) - Bilateral primary osteoarthritis of knee  R29.898 (ICD-10-CM) - Hip weakness  M54.50 (ICD-10-CM) - Low back pain    THERAPY DIAG:  Cervicalgia  Chronic pain of both knees  Difficulty in walking, not elsewhere classified  Cramp and spasm  Muscle weakness (generalized)  Pain in right hip  Pain in left hip  Other low back pain  Abnormal posture  Rationale for Evaluation and Treatment: Rehabilitation  ONSET DATE: 12/03/2023  SUBJECTIVE:   SUBJECTIVE  STATEMENT: Patient reports she is doing fairly well with her knees.  She is here with new referral for her neck.    PERTINENT HISTORY: Right hip fx from fall 2 years ago, initial ORIF, then subsequent THA.   PAIN:  01/07/24 Are you having pain? Yes: NPRS scale: 3-4 neck , 0/10 Pain location: knees, hips, low back  Pain description: aching Aggravating factors: prolonged sitting or standing Relieving factors: Tylenol arthritis  PRECAUTIONS: None  RED FLAGS: Bowel or bladder incontinence: Yes: age related    WEIGHT BEARING RESTRICTIONS: No  FALLS:  Has patient fallen in last 6 months? No  LIVING ENVIRONMENT: Lives with: lives with their family and lives with their spouse Lives in: House/apartment Stairs: Yes: Internal: 12 steps; can reach both and External: 4 steps; can reach both Has following equipment at home: None  OCCUPATION: retired  PLOF: Independent, Independent with basic ADLs, Independent with household mobility without device, Independent with community mobility without device, Independent with homemaking with ambulation, Independent with gait, Independent with transfers, Vocation/Vocational requirements: retired, and Leisure: read, watch TV, go out with friends and go to the beach  PATIENT GOALS: to be able to walk and do routine daily activities with manageable pain/ to be able to turn my head and sleep without pain in my neck.   NEXT MD VISIT: na  OBJECTIVE:  Note: Objective measures were completed at Evaluation unless otherwise noted.  DIAGNOSTIC FINDINGS: na  PATIENT SURVEYS:  Eval:  LEFS 41/80  COGNITION: Overall cognitive status: Within functional limits for tasks assessed     SENSATION: WFL   MUSCLE LENGTH: Hamstrings: Right 40 deg; Left 45 deg Thomas test: Right pos; Left pos  POSTURE: rounded shoulders and forward head  PALPATION: Palpable crepitus bilateral knees on seated flexion / extension  CERVICAL ROM: Flexion: 40 deg Extension: 40  deg SB right : 25 deg SB left : 22 deg Rot right: 40 deg Rot left: 45 deg  UPPER EXTREMITY ROM:     WFL  UPPER EXTREMITY MMT:  Generally 4 to 4+/5  LOWER EXTREMITY ROM:  WFL  LOWER EXTREMITY MMT:  Bilateral hips generally 4-/5, bilateral knees generally 4/5, ankles 5/5  LOWER EXTREMITY SPECIAL TESTS:  Hip special tests: Luisa Hart (FABER) test: positive  and Thomas test: positive  Knee special tests: did not need this one.  FUNCTIONAL TESTS:  Eval: 5 times sit to stand: 16.49 sec Timed up and go (TUG): 12.18  1/202/2024: 3 minute walk test:  529 ft with RPE of 2-3/10  GAIT: Distance walked: 30 feet Assistive device utilized: None Level of assistance: Complete Independence Comments: antalgic start up then short step length with limited heel strike  TODAY'S TREATMENT DATE: 01/07/2024 Reviewed HEP Added cervical HEP including postural strengthening: completed all reps of the following exercises Tband standing shoulder extension with red band 2 x 10 Tband standing shoulder rows with red band 2 x 10 Seated bilateral shoulder ER with red band 2 x 10 Seated bilateral shoulder horizontal abduction 2 x 10 Shoulder rolls x 20 Scapular retraction x 20 Upper trap stretch x 2 holding 10 sec each on each side Levator scapular stretch x 2 holding 10 sec on each side C spine ROM : flexion, retraction, rotation right and left, side bending right and left x 5 each direction Discussed pain control techniques for neck and for knees including ice vs heat   DATE: 01/01/2024 Nustep level 3, 8 min with PT present to discuss status Seated hamstring stretch 2x20 sec bilat Sit to stand x 10 without UE support Seated piriformis stretch 2x20 sec bilat Seated clam with yellow loop x 20 Seated with 2 1/2# ankle weights:  LAQ and marching.  2x10 each bilat Seated hip  adduction/pelvic floor ball squeeze 2x10 Standing in parallel bars calf stretch 2x20" bil   DATE: 12/24/2023 Nustep level 3, 8 min with PT present to discuss status Seated hamstring stretch 2x20 sec bilat Sit to stand x 10 without UE support Seated piriformis stretch 2x20 sec bilat Seated clam with yellow loop x 20 Seated with 2 1/2# ankle weights:  LAQ and marching.  2x10 each bilat Seated hip adduction/pelvic floor ball squeeze 2x10 3 minute walk test:  529 ft with RPE of 2-3/10  DATE: 12/05/23 Initial eval completed and initiated HEP    PATIENT EDUCATION:  Education details: Initiated HEP Person educated: Patient Education method: Programmer, multimedia, Facilities manager, Verbal cues, and Handouts Education comprehension: verbalized understanding, returned demonstration, and verbal cues required  HOME EXERCISE PROGRAM: Access Code: [email or web address] URL: [email or web address] Date: 12/09/2023 Prepared by: Clydie Braun Menke  Exercises - Standing Quad Stretch with Table and Chair Support  - 1 x daily - 7 x weekly - 1 sets - 3 reps - 30 sec hold - Seated Piriformis Stretch with Trunk Bend  - 1 x daily - 7 x weekly - 3 reps - 30 sec hold - Seated Piriformis Stretch  - 1 x daily - 7 x weekly - 2 reps - 20 sec hold - Seated Hamstring Stretch  - 1 x daily - 7 x weekly - 2 reps - 20 sec hold - Seated Long Arc Quad  - 1 x daily - 7 x weekly - 2 sets - 10 reps - Seated Pelvic Floor Contraction with Isometric Hip Adduction  - 1 x daily - 7 x weekly - 2 sets - 10 reps - Seated Hip Abduction with Resistance  - 1 x daily - 7 x weekly - 2 sets - 10 reps  ASSESSMENT:  CLINICAL IMPRESSION: Patient with new referral for C spine eval.  She presents with minimal loss of ROM but painful crepitus at restricted ranges.  UE ROM and strength normal.  Upper trap and levator scapula with trigger points and taut bands.  She would benefit from C spine ROM and postural strengthening along with possible dry needling.   She would benefit from skilled PT to finish up her rehab for her knees along with treatment for her c spine and postural issues.    OBJECTIVE IMPAIRMENTS: Abnormal gait, decreased balance, decreased coordination, decreased mobility, difficulty walking, decreased ROM, decreased strength, increased fascial restrictions, increased muscle spasms, impaired flexibility, postural dysfunction, and pain.   ACTIVITY  LIMITATIONS: carrying, lifting, bending, sitting, standing, squatting, sleeping, stairs, transfers, bed mobility, continence, bathing, toileting, dressing, hygiene/grooming, and caring for others  PARTICIPATION LIMITATIONS: meal prep, cleaning, laundry, driving, shopping, community activity, yard work, and church  PERSONAL FACTORS: Age, Fitness, Time since onset of injury/illness/exacerbation, and 3+ comorbidities: previous back surgery, prior hip surgery x 2, Htn, Osteopenia  are also affecting patient's functional outcome.   REHAB POTENTIAL: Fair multiple former orthopedic issues and age  CLINICAL DECISION MAKING: Evolving/moderate complexity  EVALUATION COMPLEXITY: Moderate   GOALS: Goals reviewed with patient? Yes  SHORT TERM GOALS: Target date: 01/02/2024 ( C spine goal date 02/05/2024)   Pain report to be no greater than 4/10  Baseline: Goal status: MET for knees ( will continue to work on Cspine pain level)  2.  Patient will be independent with initial HEP for knees and Cspine Baseline:  Goal status: Ongoing   LONG TERM GOALS: Target date: 01/30/2024 (C spine long term goal date 02/19/2024)   Patient to report pain no greater than 2/10 Baseline:  Goal status:  (knee goal met, will leave in place for c spine)  2.  Patient to be independent with advanced HEP  Baseline:  Goal status: (knee goal met, will leave in place for c spine)  3.  Patient to be able to bend, stoop and squat with pain no greater than 2/10  Baseline:  Goal status: In progress (knees)  4.  Patient  to be able to ascend and descend steps without pain or no greater than 2/10  Baseline:  Goal status: In progress (knees)  5.  Patient to be able to stand or walk for at least 15 min without leg pain  Baseline:  Goal status: In progress (knees)  6.  LEFS score to improve by 2-4 points Baseline:  Goal status: In progress  7. Patient to be able to sleep through the night   Baseline:  Goal status: NEW  8. Patient to demonstrate full Cspine ROM without pain  Baseline:  Goal status: NEW   PLAN:  PT FREQUENCY: 1-2x/week  PT DURATION: 8 weeks  PLANNED INTERVENTIONS: 97110-Therapeutic exercises, 97530- Therapeutic activity, [id number]- Neuromuscular re-education, 97535- Self Care, 96045- Manual therapy, [id number]- Gait training, [phone]- Aquatic Therapy, 97014- Electrical stimulation (unattended), [phone]- Electrical stimulation (manual), 97016- Vasopneumatic device, [id number]- Ultrasound, [id number]- Ionotophoresis 4mg /ml Dexamethasone, Patient/Family education, Balance training, Stair training, Taping, Dry Needling, Joint mobilization, Spinal mobilization, Compression bandaging, Vestibular training, Visual/preceptual remediation/compensation, DME instructions, Cryotherapy, and Moist heat  PLAN FOR NEXT SESSION: Review new HEP for C spine, Nustep, continue quad rehab, possibly DN for C spine if patient agrees.     Victorino Dike B. Hartwell Vandiver, PT 01/08/24 8:31 AM [facility] [address] [place], [place] [address] Phone # [phone] Fax [phone]

## 2024-01-13 DIAGNOSIS — I10 Essential (primary) hypertension: Secondary | ICD-10-CM | POA: Diagnosis not present

## 2024-01-13 DIAGNOSIS — N1831 Chronic kidney disease, stage 3a: Secondary | ICD-10-CM | POA: Diagnosis not present

## 2024-01-14 ENCOUNTER — Ambulatory Visit: Payer: PPO

## 2024-01-14 DIAGNOSIS — R252 Cramp and spasm: Secondary | ICD-10-CM

## 2024-01-14 DIAGNOSIS — G8929 Other chronic pain: Secondary | ICD-10-CM

## 2024-01-14 DIAGNOSIS — R293 Abnormal posture: Secondary | ICD-10-CM

## 2024-01-14 DIAGNOSIS — M5459 Other low back pain: Secondary | ICD-10-CM

## 2024-01-14 DIAGNOSIS — R262 Difficulty in walking, not elsewhere classified: Secondary | ICD-10-CM

## 2024-01-14 DIAGNOSIS — M542 Cervicalgia: Secondary | ICD-10-CM

## 2024-01-14 DIAGNOSIS — M25551 Pain in right hip: Secondary | ICD-10-CM

## 2024-01-14 DIAGNOSIS — M25561 Pain in right knee: Secondary | ICD-10-CM | POA: Diagnosis not present

## 2024-01-14 DIAGNOSIS — M6281 Muscle weakness (generalized): Secondary | ICD-10-CM

## 2024-01-14 DIAGNOSIS — M25552 Pain in left hip: Secondary | ICD-10-CM

## 2024-01-14 NOTE — Therapy (Signed)
 OUTPATIENT PHYSICAL THERAPY TREATMENT NOTE   Patient Name: Debbie Houston MRN: [id number] DOB:02/13/1941, 83 y.o., female Today's Date: 01/14/2024  END OF SESSION:  PT End of Session - 01/14/24 1147     Visit Number 6    Date for PT Re-Evaluation 02/18/24    Authorization Type Medicare    Progress Note Due on Visit 10    PT Start Time 1147    PT Stop Time 1223    PT Time Calculation (min) 36 min    Activity Tolerance Patient tolerated treatment well    Behavior During Therapy WFL for tasks assessed/performed              Past Medical History:  Diagnosis Date   Arthritis    Benign labile hypertension    Cancer (HCC)    skin cancer on face   Cellulitis    Colitis    DJD (degenerative joint disease)    Endometrial polyp    GERD (gastroesophageal reflux disease)    History of basal cell carcinoma excision    History of esophagitis    HOH (hard of hearing)    right ear better than left  -- wears no aides   Hyperlipidemia    Hypothyroidism    Neuromuscular disorder (HCC)    neuropathy in feet   Osteopenia    Prediabetes    S/P right hip fracture 08/17/2020   Sepsis (HCC) 2023   per patient   Vitamin D deficiency    Wears glasses    Past Surgical History:  Procedure Laterality Date   BACK SURGERY  2020   BIOPSY  09/27/2023   Procedure: BIOPSY;  Surgeon: Vida Rigger, MD;  Location: [facility] ENDOSCOPY;  Service: Gastroenterology;;   CATARACT EXTRACTION, BILATERAL Bilateral 2020   CONVERSION TO TOTAL HIP Right 01/22/2022   Procedure: CONVERSION TO TOTAL HIP;  Surgeon: Joen Laura, MD;  Location: [facility] ORS;  Service: Orthopedics;  Laterality: Right;   DILATATION & CURETTAGE/HYSTEROSCOPY WITH MYOSURE N/A 10/27/2014   Procedure: DILATATION & CURETTAGE/HYSTEROSCOPY WITH MYOSURE;  Surgeon: Juluis Mire, MD;  Location: [facility] ;  Service: Gynecology;  Laterality: N/A;   DILATION AND CURETTAGE OF UTERUS     FLEXIBLE SIGMOIDOSCOPY N/A 09/27/2023    Procedure: FLEXIBLE SIGMOIDOSCOPY;  Surgeon: Vida Rigger, MD;  Location: [facility] ENDOSCOPY;  Service: Gastroenterology;  Laterality: N/A;   HIP SURGERY Right 04/2020   rod and screw in right hip    MOHS SURGERY  2005   LEFT NASAL BRIDGE FOR BASAL CELL   TONSILLECTOMY  age 81   Patient Active Problem List   Diagnosis Date Noted   Hyponatremia 09/26/2023   Cellulitis of left buttock 09/26/2023   Proctitis 09/25/2023   History of microscopic colitis 09/25/2023   GERD (gastroesophageal reflux disease) 09/25/2023   Sepsis secondary to UTI (HCC) 06/24/2022   Extensor tenosynovitis of right wrist 06/23/2022   Thrombocytopenia (HCC) 06/23/2022   Prediabetes 06/23/2022   Intertrochanteric fracture of right femur, closed, with nonunion, subsequent encounter 01/22/2022   History of ulcerative colitis 07/10/2021   Complete right bundle branch block (RBBB) 05/15/2021   Body mass index (BMI) of 19.0-19.9 in adult 05/12/2021   Bilateral nephrolithiasis 09/21/2020   Senile purpura (HCC) 08/17/2020   Bilateral temporomandibular joint pain 06/15/2020   Spondylolisthesis at L4-L5 level 11/25/2018   Essential hypertension    Hyperlipidemia    Hypothyroidism    GERD    Asthma    DJD (degenerative joint disease)  Vitamin D deficiency    Osteoporosis with current pathological fracture    Allergy     PCP:   Lucky Cowboy, MD    REFERRING PROVIDER: Melina Fiddler, MD  REFERRING DIAG:  Diagnosis  M17.0 (ICD-10-CM) - Bilateral primary osteoarthritis of knee  R29.898 (ICD-10-CM) - Hip weakness  M54.50 (ICD-10-CM) - Low back pain    THERAPY DIAG:  Cervicalgia  Chronic pain of both knees  Difficulty in walking, not elsewhere classified  Cramp and spasm  Muscle weakness (generalized)  Abnormal posture  Pain in left hip  Pain in right hip  Other low back pain  Rationale for Evaluation and Treatment: Rehabilitation  ONSET DATE: 12/03/2023  SUBJECTIVE:   SUBJECTIVE  STATEMENT: Patient reports she is still stiff in her neck but she is doing her exercises and feels like it is better.     PERTINENT HISTORY: Right hip fx from fall 2 years ago, initial ORIF, then subsequent THA.   PAIN:  01/07/24 Are you having pain? Yes: NPRS scale: 3-4 neck , 0/10 Pain location: knees, hips, low back  Pain description: aching Aggravating factors: prolonged sitting or standing Relieving factors: Tylenol arthritis  PRECAUTIONS: None  RED FLAGS: Bowel or bladder incontinence: Yes: age related    WEIGHT BEARING RESTRICTIONS: No  FALLS:  Has patient fallen in last 6 months? No  LIVING ENVIRONMENT: Lives with: lives with their family and lives with their spouse Lives in: House/apartment Stairs: Yes: Internal: 12 steps; can reach both and External: 4 steps; can reach both Has following equipment at home: None  OCCUPATION: retired  PLOF: Independent, Independent with basic ADLs, Independent with household mobility without device, Independent with community mobility without device, Independent with homemaking with ambulation, Independent with gait, Independent with transfers, Vocation/Vocational requirements: retired, and Leisure: read, watch TV, go out with friends and go to the beach  PATIENT GOALS: to be able to walk and do routine daily activities with manageable pain/ to be able to turn my head and sleep without pain in my neck.   NEXT MD VISIT: na  OBJECTIVE:  Note: Objective measures were completed at Evaluation unless otherwise noted.  DIAGNOSTIC FINDINGS: na  PATIENT SURVEYS:  Eval:  LEFS 41/80  COGNITION: Overall cognitive status: Within functional limits for tasks assessed     SENSATION: WFL   MUSCLE LENGTH: Hamstrings: Right 40 deg; Left 45 deg Thomas test: Right pos; Left pos  POSTURE: rounded shoulders and forward head  PALPATION: Palpable crepitus bilateral knees on seated flexion / extension  CERVICAL ROM: Flexion: 40  deg Extension: 40 deg SB right : 25 deg SB left : 22 deg Rot right: 40 deg Rot left: 45 deg  UPPER EXTREMITY ROM:     WFL  UPPER EXTREMITY MMT:  Generally 4 to 4+/5  LOWER EXTREMITY ROM:  WFL  LOWER EXTREMITY MMT:  Bilateral hips generally 4-/5, bilateral knees generally 4/5, ankles 5/5  LOWER EXTREMITY SPECIAL TESTS:  Hip special tests: Luisa Hart (FABER) test: positive  and Thomas test: positive  Knee special tests: did not need this one.  FUNCTIONAL TESTS:  Eval: 5 times sit to stand: 16.49 sec Timed up and go (TUG): 12.18  1/202/2024: 3 minute walk test:  529 ft with RPE of 2-3/10  GAIT: Distance walked: 30 feet Assistive device utilized: None Level of assistance: Complete Independence Comments: antalgic start up then short step length with limited heel strike  TODAY'S TREATMENT DATE: 01/14/2024 UBE x 6 min (3/3) level 1 (PT present to discuss status) 3 way scapular stabilization with blue loop x 5 each UE 4D ball rolls x 20 each direction on each arm Wing taps on wall x 10 Thoracic extension seated with purple ball behind back x 10 holding 2-3 seconds Tband standing shoulder extension with red band 2 x 10 Tband standing shoulder rows with red band 2 x 10 Seated bilateral shoulder ER with red band 2 x 10 Seated bilateral shoulder horizontal abduction 2 x 10 Trigger Point Dry Needling Initial Treatment: Pt instructed on Dry Needling rational, procedures, and possible side effects. Pt instructed to expect mild to moderate muscle soreness later in the day and/or into the next day.  Pt instructed in methods to reduce muscle soreness. Pt instructed to continue prescribed HEP. Because Dry Needling was performed over or adjacent to a lung field, pt was educated on S/S of pneumothorax and to seek immediate medical attention should they occur.   Patient was educated on signs and symptoms of infection and other risk factors and advised to seek medical attention should they occur.  Patient verbalized understanding of these instructions and education.  Patient Verbal Consent Given: Yes Education Handout Provided: Previously Provided Muscles Treated: bilateral upper traps Electrical Stimulation Performed: No Treatment Response/Outcome: Skilled palpation used to identify taut bands and trigger points.  Once identified, dry needling techniques used to treat these areas.  Twitch response ellicited along with palpable elongation of muscle.  Following treatment, patient reports minimal soreness.  She was able to do cervical rotation to near [facility]    DATE: 01/07/2024 Reviewed HEP Added cervical HEP including postural strengthening: completed all reps of the following exercises Tband standing shoulder extension with red band 2 x 10 Tband standing shoulder rows with red band 2 x 10 Seated bilateral shoulder ER with red band 2 x 10 Seated bilateral shoulder horizontal abduction 2 x 10 Shoulder rolls x 20 Scapular retraction x 20 Upper trap stretch x 2 holding 10 sec each on each side Levator scapular stretch x 2 holding 10 sec on each side C spine ROM : flexion, retraction, rotation right and left, side bending right and left x 5 each direction Discussed pain control techniques for neck and for knees including ice vs heat   DATE: 01/01/2024 Nustep level 3, 8 min with PT present to discuss status Seated hamstring stretch 2x20 sec bilat Sit to stand x 10 without UE support Seated piriformis stretch 2x20 sec bilat Seated clam with yellow loop x 20 Seated with 2 1/2# ankle weights:  LAQ and marching.  2x10 each bilat Seated hip adduction/pelvic floor ball squeeze 2x10 Standing in parallel bars calf stretch 2x20" bil   DATE: 12/05/23 Initial eval completed and initiated HEP    PATIENT EDUCATION:  Education details: Initiated HEP Person  educated: Patient Education method: Programmer, multimedia, Facilities manager, Verbal cues, and Handouts Education comprehension: verbalized understanding, returned demonstration, and verbal cues required  HOME EXERCISE PROGRAM: Access Code: [email or web address] URL: [email or web address] Date: 12/09/2023 Prepared by: Reather Laurence  Exercises - Standing Quad Stretch with Table and Chair Support  - 1 x daily - 7 x weekly - 1 sets - 3 reps - 30 sec hold - Seated Piriformis Stretch with Trunk Bend  - 1 x daily - 7 x weekly - 3 reps - 30 sec hold - Seated Piriformis Stretch  - 1 x daily - 7 x weekly - 2 reps - 20 sec hold - Seated  Hamstring Stretch  - 1 x daily - 7 x weekly - 2 reps - 20 sec hold - Seated Long Arc Quad  - 1 x daily - 7 x weekly - 2 sets - 10 reps - Seated Pelvic Floor Contraction with Isometric Hip Adduction  - 1 x daily - 7 x weekly - 2 sets - 10 reps - Seated Hip Abduction with Resistance  - 1 x daily - 7 x weekly - 2 sets - 10 reps  ASSESSMENT:  CLINICAL IMPRESSION: Patient had some mild relief of symptoms since last visit.  She seems to be very compliant and well motivated with her HEP.  We added several higher level postural exercises today.  She fatigued but needed no rest breaks during the 2 sets of 10 on each task.  She should continue to do well.    She would benefit from continued skilled PT to finish up her rehab for her knees along with treatment for her c spine and postural issues.    OBJECTIVE IMPAIRMENTS: Abnormal gait, decreased balance, decreased coordination, decreased mobility, difficulty walking, decreased ROM, decreased strength, increased fascial restrictions, increased muscle spasms, impaired flexibility, postural dysfunction, and pain.   ACTIVITY LIMITATIONS: carrying, lifting, bending, sitting, standing, squatting, sleeping, stairs, transfers, bed mobility, continence, bathing, toileting, dressing, hygiene/grooming, and caring for others  PARTICIPATION  LIMITATIONS: meal prep, cleaning, laundry, driving, shopping, community activity, yard work, and church  PERSONAL FACTORS: Age, Fitness, Time since onset of injury/illness/exacerbation, and 3+ comorbidities: previous back surgery, prior hip surgery x 2, Htn, Osteopenia  are also affecting patient's functional outcome.   REHAB POTENTIAL: Fair multiple former orthopedic issues and age  CLINICAL DECISION MAKING: Evolving/moderate complexity  EVALUATION COMPLEXITY: Moderate   GOALS: Goals reviewed with patient? Yes  SHORT TERM GOALS: Target date: 01/02/2024 ( C spine goal date 02/05/2024)   Pain report to be no greater than 4/10  Baseline: Goal status: MET for knees ( will continue to work on Cspine pain level)  2.  Patient will be independent with initial HEP for knees and Cspine Baseline:  Goal status: Ongoing   LONG TERM GOALS: Target date: 01/30/2024 (C spine long term goal date 02/19/2024)   Patient to report pain no greater than 2/10 Baseline:  Goal status:  (knee goal met, will leave in place for c spine)  2.  Patient to be independent with advanced HEP  Baseline:  Goal status: (knee goal met, will leave in place for c spine)  3.  Patient to be able to bend, stoop and squat with pain no greater than 2/10  Baseline:  Goal status: In progress (knees)  4.  Patient to be able to ascend and descend steps without pain or no greater than 2/10  Baseline:  Goal status: In progress (knees)  5.  Patient to be able to stand or walk for at least 15 min without leg pain  Baseline:  Goal status: In progress (knees)  6.  LEFS score to improve by 2-4 points Baseline:  Goal status: In progress  7. Patient to be able to sleep through the night   Baseline:  Goal status: NEW  8. Patient to demonstrate full Cspine ROM without pain  Baseline:  Goal status: NEW   PLAN:  PT FREQUENCY: 1-2x/week  PT DURATION: 8 weeks  PLANNED INTERVENTIONS: 97110-Therapeutic exercises, 97530-  Therapeutic activity, [id number]- Neuromuscular re-education, 97535- Self Care, 16109- Manual therapy, [id number]- Gait training, [id number]- Aquatic Therapy, 97014- Electrical stimulation (unattended), [id number]- Electrical stimulation (manual), [id number]-  Vasopneumatic device, [id number]- Ultrasound, 16109- Ionotophoresis 4mg /ml Dexamethasone, Patient/Family education, Balance training, Stair training, Taping, Dry Needling, Joint mobilization, Spinal mobilization, Compression bandaging, Vestibular training, Visual/preceptual remediation/compensation, DME instructions, Cryotherapy, and Moist heat  PLAN FOR NEXT SESSION: UBE, assess response to DN #1, progress postural strengthening.      Victorino Dike B. Laterrance Nauta, PT 01/14/24 12:33 PM [facility] [address] [place], [place] [address] Phone # [phone] Fax [phone]

## 2024-01-15 DIAGNOSIS — K52839 Microscopic colitis, unspecified: Secondary | ICD-10-CM | POA: Diagnosis not present

## 2024-01-20 DIAGNOSIS — N289 Disorder of kidney and ureter, unspecified: Secondary | ICD-10-CM | POA: Diagnosis not present

## 2024-01-21 ENCOUNTER — Ambulatory Visit: Payer: Medicare Other | Attending: Sports Medicine

## 2024-01-21 DIAGNOSIS — M6281 Muscle weakness (generalized): Secondary | ICD-10-CM

## 2024-01-21 DIAGNOSIS — R262 Difficulty in walking, not elsewhere classified: Secondary | ICD-10-CM

## 2024-01-21 DIAGNOSIS — M25552 Pain in left hip: Secondary | ICD-10-CM

## 2024-01-21 DIAGNOSIS — M542 Cervicalgia: Secondary | ICD-10-CM | POA: Diagnosis not present

## 2024-01-21 DIAGNOSIS — M25561 Pain in right knee: Secondary | ICD-10-CM | POA: Diagnosis present

## 2024-01-21 DIAGNOSIS — R293 Abnormal posture: Secondary | ICD-10-CM

## 2024-01-21 DIAGNOSIS — R252 Cramp and spasm: Secondary | ICD-10-CM | POA: Diagnosis not present

## 2024-01-21 DIAGNOSIS — M25562 Pain in left knee: Secondary | ICD-10-CM | POA: Insufficient documentation

## 2024-01-21 DIAGNOSIS — M25551 Pain in right hip: Secondary | ICD-10-CM | POA: Diagnosis not present

## 2024-01-21 DIAGNOSIS — G8929 Other chronic pain: Secondary | ICD-10-CM | POA: Diagnosis not present

## 2024-01-21 DIAGNOSIS — M5459 Other low back pain: Secondary | ICD-10-CM

## 2024-01-21 NOTE — Therapy (Signed)
 OUTPATIENT PHYSICAL THERAPY TREATMENT NOTE   Patient Name: Debbie Houston MRN: [id number] DOB:[birth year], 83 y.o., female Today's Date: 01/21/2024  END OF SESSION:  PT End of Session - 01/21/24 1406     Visit Number 7    Date for PT Re-Evaluation 02/18/24    Authorization Type Medicare    Progress Note Due on Visit 10    PT Start Time 1400    PT Stop Time 1444    PT Time Calculation (min) 44 min    Activity Tolerance Patient tolerated treatment well    Behavior During Therapy WFL for tasks assessed/performed              Past Medical History:  Diagnosis Date   Arthritis    Benign labile hypertension    Cancer (HCC)    skin cancer on face   Cellulitis    Colitis    DJD (degenerative joint disease)    Endometrial polyp    GERD (gastroesophageal reflux disease)    History of basal cell carcinoma excision    History of esophagitis    HOH (hard of hearing)    right ear better than left  -- wears no aides   Hyperlipidemia    Hypothyroidism    Neuromuscular disorder (HCC)    neuropathy in feet   Osteopenia    Prediabetes    S/P right hip fracture 08/17/2020   Sepsis (HCC) 2023   per patient   Vitamin D deficiency    Wears glasses    Past Surgical History:  Procedure Laterality Date   BACK SURGERY  2020   BIOPSY  09/27/2023   Procedure: BIOPSY;  Surgeon: Vida Rigger, MD;  Location: [facility] ENDOSCOPY;  Service: Gastroenterology;;   CATARACT EXTRACTION, BILATERAL Bilateral 2020   CONVERSION TO TOTAL HIP Right 01/22/2022   Procedure: CONVERSION TO TOTAL HIP;  Surgeon: Joen Laura, MD;  Location: [facility] ORS;  Service: Orthopedics;  Laterality: Right;   DILATATION & CURETTAGE/HYSTEROSCOPY WITH MYOSURE N/A 10/27/2014   Procedure: DILATATION & CURETTAGE/HYSTEROSCOPY WITH MYOSURE;  Surgeon: Juluis Mire, MD;  Location: [facility];  Service: Gynecology;  Laterality: N/A;   DILATION AND CURETTAGE OF UTERUS     FLEXIBLE SIGMOIDOSCOPY N/A 09/27/2023    Procedure: FLEXIBLE SIGMOIDOSCOPY;  Surgeon: Vida Rigger, MD;  Location: [facility] ENDOSCOPY;  Service: Gastroenterology;  Laterality: N/A;   HIP SURGERY Right 04/2020   rod and screw in right hip    MOHS SURGERY  2005   LEFT NASAL BRIDGE FOR BASAL CELL   TONSILLECTOMY  age 27   Patient Active Problem List   Diagnosis Date Noted   Hyponatremia 09/26/2023   Cellulitis of left buttock 09/26/2023   Proctitis 09/25/2023   History of microscopic colitis 09/25/2023   GERD (gastroesophageal reflux disease) 09/25/2023   Sepsis secondary to UTI (HCC) 06/24/2022   Extensor tenosynovitis of right wrist 06/23/2022   Thrombocytopenia (HCC) 06/23/2022   Prediabetes 06/23/2022   Intertrochanteric fracture of right femur, closed, with nonunion, subsequent encounter 01/22/2022   History of ulcerative colitis 07/10/2021   Complete right bundle branch block (RBBB) 05/15/2021   Body mass index (BMI) of 19.0-19.9 in adult 05/12/2021   Bilateral nephrolithiasis 09/21/2020   Senile purpura (HCC) 08/17/2020   Bilateral temporomandibular joint pain 06/15/2020   Spondylolisthesis at L4-L5 level 11/25/2018   Essential hypertension    Hyperlipidemia    Hypothyroidism    GERD    Asthma    DJD (degenerative joint disease)  Vitamin D deficiency    Osteoporosis with current pathological fracture    Allergy     PCP:   Lucky Cowboy, MD    REFERRING PROVIDER: Melina Fiddler, MD  REFERRING DIAG:  Diagnosis  M17.0 (ICD-10-CM) - Bilateral primary osteoarthritis of knee  R29.898 (ICD-10-CM) - Hip weakness  M54.50 (ICD-10-CM) - Low back pain    THERAPY DIAG:  Cervicalgia  Abnormal posture  Muscle weakness (generalized)  Difficulty in walking, not elsewhere classified  Chronic pain of both knees  Cramp and spasm  Pain in left hip  Pain in right hip  Other low back pain  Rationale for Evaluation and Treatment: Rehabilitation  ONSET DATE: 12/03/2023  SUBJECTIVE:   SUBJECTIVE  STATEMENT: Patient reports she had good relief for several days after the dry needling.  It started hurting again this morning.  "I wasn't too good about doing my exercises since I was here last"     PERTINENT HISTORY: Right hip fx from fall 2 years ago, initial ORIF, then subsequent THA.   PAIN:  01/07/24 Are you having pain? Yes: NPRS scale: 3-4 neck , 0/10 Pain location: knees, hips, low back  Pain description: aching Aggravating factors: prolonged sitting or standing Relieving factors: Tylenol arthritis  PRECAUTIONS: None  RED FLAGS: Bowel or bladder incontinence: Yes: age related    WEIGHT BEARING RESTRICTIONS: No  FALLS:  Has patient fallen in last 6 months? No  LIVING ENVIRONMENT: Lives with: lives with their family and lives with their spouse Lives in: House/apartment Stairs: Yes: Internal: 12 steps; can reach both and External: 4 steps; can reach both Has following equipment at home: None  OCCUPATION: retired  PLOF: Independent, Independent with basic ADLs, Independent with household mobility without device, Independent with community mobility without device, Independent with homemaking with ambulation, Independent with gait, Independent with transfers, Vocation/Vocational requirements: retired, and Leisure: read, watch TV, go out with friends and go to the beach  PATIENT GOALS: to be able to walk and do routine daily activities with manageable pain/ to be able to turn my head and sleep without pain in my neck.   NEXT MD VISIT: na  OBJECTIVE:  Note: Objective measures were completed at Evaluation unless otherwise noted.  DIAGNOSTIC FINDINGS: na  PATIENT SURVEYS:  Eval:  LEFS 41/80  COGNITION: Overall cognitive status: Within functional limits for tasks assessed     SENSATION: WFL   MUSCLE LENGTH: Hamstrings: Right 40 deg; Left 45 deg Thomas test: Right pos; Left pos  POSTURE: rounded shoulders and forward head  PALPATION: Palpable crepitus bilateral  knees on seated flexion / extension  CERVICAL ROM: Flexion: 40 deg Extension: 40 deg SB right : 25 deg SB left : 22 deg Rot right: 40 deg Rot left: 45 deg  UPPER EXTREMITY ROM:     WFL  UPPER EXTREMITY MMT:  Generally 4 to 4+/5  LOWER EXTREMITY ROM:  WFL  LOWER EXTREMITY MMT:  Bilateral hips generally 4-/5, bilateral knees generally 4/5, ankles 5/5  LOWER EXTREMITY SPECIAL TESTS:  Hip special tests: Luisa Hart (FABER) test: positive  and Thomas test: positive  Knee special tests: did not need this one.  FUNCTIONAL TESTS:  Eval: 5 times sit to stand: 16.49 sec Timed up and go (TUG): 12.18  1/202/2024: 3 minute walk test:  529 ft with RPE of 2-3/10  GAIT: Distance walked: 30 feet Assistive device utilized: None Level of assistance: Complete Independence Comments: antalgic start up then short step length with limited heel strike  TODAY'S TREATMENT DATE: 01/21/2024 UBE x 6 min (3/3) level 1 (PT present to discuss status) 3 way scapular stabilization with blue loop x 5 each UE 4D ball rolls x 20 each direction on each arm (2 lb plyo ball) Wing taps on wall x 10 Thoracic extension seated with purple ball behind back x 10 holding 5 seconds Tband standing shoulder extension with red band 2 x 10 Tband standing shoulder rows with red band 2 x 10 Seated bilateral shoulder ER with red band 2 x 10 Seated bilateral shoulder horizontal abduction 2 x 10 with red tband Palloff press 2 x 10 each side with red tband Seated shoulder flexion  and scaption with 1 lb dumbbells 2 x 10 Trigger Point Dry Needling Subsequent Treatment: Instructions provided previously at initial dry needling treatment.  Patient Verbal Consent Given: Yes Education Handout Provided: Previously Provided Muscles Treated: bilateral upper traps Electrical Stimulation Performed:  No Treatment Response/Outcome: Skilled palpation used to identify taut bands and trigger points.  Once identified, dry needling techniques used to treat these areas.  Twitch response ellicited along with palpable elongation of muscle.  Following treatment, patient reported similar soreness to last visit.      DATE: 01/14/2024 UBE x 6 min (3/3) level 1 (PT present to discuss status) 3 way scapular stabilization with blue loop x 5 each UE 4D ball rolls x 20 each direction on each arm Wing taps on wall x 10 Thoracic extension seated with purple ball behind back x 10 holding 2-3 seconds Tband standing shoulder extension with red band 2 x 10 Tband standing shoulder rows with red band 2 x 10 Seated bilateral shoulder ER with red band 2 x 10 Seated bilateral shoulder horizontal abduction 2 x 10 Trigger Point Dry Needling Initial Treatment: Pt instructed on Dry Needling rational, procedures, and possible side effects. Pt instructed to expect mild to moderate muscle soreness later in the day and/or into the next day.  Pt instructed in methods to reduce muscle soreness. Pt instructed to continue prescribed HEP. Because Dry Needling was performed over or adjacent to a lung field, pt was educated on S/S of pneumothorax and to seek immediate medical attention should they occur.  Patient was educated on signs and symptoms of infection and other risk factors and advised to seek medical attention should they occur.  Patient verbalized understanding of these instructions and education.  Patient Verbal Consent Given: Yes Education Handout Provided: Previously Provided Muscles Treated: bilateral upper traps Electrical Stimulation Performed: No Treatment Response/Outcome: Skilled palpation used to identify taut bands and trigger points.  Once identified, dry needling techniques used to treat these areas.  Twitch response ellicited along with palpable elongation of muscle.  Following treatment, patient reports  minimal soreness.  She was able to do cervical rotation to near [facility]    DATE: 01/07/2024 Reviewed HEP Added cervical HEP including postural strengthening: completed all reps of the following exercises Tband standing shoulder extension with red band 2 x 10 Tband standing shoulder rows with red band 2 x 10 Seated bilateral shoulder ER with red band 2 x 10 Seated bilateral shoulder horizontal abduction 2 x 10 Shoulder rolls x 20 Scapular retraction x 20 Upper trap stretch x 2 holding 10 sec each on each side Levator scapular stretch x 2 holding 10 sec on each side C spine ROM : flexion, retraction, rotation right and left, side bending right and left x 5 each direction Discussed pain control techniques for neck and for knees including ice  vs heat   DATE: 12/05/23 Initial eval completed and initiated HEP    PATIENT EDUCATION:  Education details: Initiated HEP Person educated: Patient Education method: Programmer, multimedia, Facilities manager, Verbal cues, and Handouts Education comprehension: verbalized understanding, returned demonstration, and verbal cues required  HOME EXERCISE PROGRAM: Access Code: [email or web address] URL: [email or web address] Date: 12/09/2023 Prepared by: Clydie Braun Menke  Exercises - Standing Quad Stretch with Table and Chair Support  - 1 x daily - 7 x weekly - 1 sets - 3 reps - 30 sec hold - Seated Piriformis Stretch with Trunk Bend  - 1 x daily - 7 x weekly - 3 reps - 30 sec hold - Seated Piriformis Stretch  - 1 x daily - 7 x weekly - 2 reps - 20 sec hold - Seated Hamstring Stretch  - 1 x daily - 7 x weekly - 2 reps - 20 sec hold - Seated Long Arc Quad  - 1 x daily - 7 x weekly - 2 sets - 10 reps - Seated Pelvic Floor Contraction with Isometric Hip Adduction  - 1 x daily - 7 x weekly - 2 sets - 10 reps - Seated Hip Abduction with Resistance  - 1 x daily - 7 x weekly - 2 sets - 10 reps  ASSESSMENT:  CLINICAL IMPRESSION: Patient had good response to DN #1.  She had  some return of discomfort this morning, however.  She admits she wasn't as compliant with her HEP this weekend.  We added seated shoulder flexion and scaption today.  She demonstrates no fatigue today.   She should continue to do well.    She would benefit from continued skilled PT to finish up her rehab for her knees along with treatment for her c spine and postural issues.    OBJECTIVE IMPAIRMENTS: Abnormal gait, decreased balance, decreased coordination, decreased mobility, difficulty walking, decreased ROM, decreased strength, increased fascial restrictions, increased muscle spasms, impaired flexibility, postural dysfunction, and pain.   ACTIVITY LIMITATIONS: carrying, lifting, bending, sitting, standing, squatting, sleeping, stairs, transfers, bed mobility, continence, bathing, toileting, dressing, hygiene/grooming, and caring for others  PARTICIPATION LIMITATIONS: meal prep, cleaning, laundry, driving, shopping, community activity, yard work, and church  PERSONAL FACTORS: Age, Fitness, Time since onset of injury/illness/exacerbation, and 3+ comorbidities: previous back surgery, prior hip surgery x 2, Htn, Osteopenia  are also affecting patient's functional outcome.   REHAB POTENTIAL: Fair multiple former orthopedic issues and age  CLINICAL DECISION MAKING: Evolving/moderate complexity  EVALUATION COMPLEXITY: Moderate   GOALS: Goals reviewed with patient? Yes  SHORT TERM GOALS: Target date: 01/02/2024 ( C spine goal date 02/05/2024)   Pain report to be no greater than 4/10  Baseline: Goal status: MET for knees ( will continue to work on Cspine pain level)  2.  Patient will be independent with initial HEP for knees and Cspine Baseline:  Goal status: MET 01/21/24   LONG TERM GOALS: Target date: 01/30/2024 (C spine long term goal date 02/19/2024)   Patient to report pain no greater than 2/10 Baseline:  Goal status:  (knee goal met, will leave in place for c spine)  2.  Patient to be  independent with advanced HEP  Baseline:  Goal status: (knee goal met, will leave in place for c spine)  3.  Patient to be able to bend, stoop and squat with pain no greater than 2/10  Baseline:  Goal status: In progress (knees)  4.  Patient to be able to ascend and descend steps without pain or no greater than  2/10  Baseline:  Goal status: In progress (knees)  5.  Patient to be able to stand or walk for at least 15 min without leg pain  Baseline:  Goal status: In progress (knees)  6.  LEFS score to improve by 2-4 points Baseline:  Goal status: In progress  7. Patient to be able to sleep through the night   Baseline:  Goal status: NEW  8. Patient to demonstrate full Cspine ROM without pain  Baseline:  Goal status: NEW   PLAN:  PT FREQUENCY: 1-2x/week  PT DURATION: 8 weeks  PLANNED INTERVENTIONS: 97110-Therapeutic exercises, 97530- Therapeutic activity, [id number]- Neuromuscular re-education, 97535- Self Care, 65784- Manual therapy, [id number]- Gait training, [phone]- Aquatic Therapy, 97014- Electrical stimulation (unattended), [id number]- Electrical stimulation (manual), 97016- Vasopneumatic device, [id number]- Ultrasound, [id number]- Ionotophoresis 4mg /ml Dexamethasone, Patient/Family education, Balance training, Stair training, Taping, Dry Needling, Joint mobilization, Spinal mobilization, Compression bandaging, Vestibular training, Visual/preceptual remediation/compensation, DME instructions, Cryotherapy, and Moist heat  PLAN FOR NEXT SESSION: UBE, assess response to DN #2, progress postural strengthening.      Victorino Dike B. Ethridge Sollenberger, PT 01/21/24 2:48 PM [facility] [address] [place], [place] [address] Phone # [phone] Fax [phone]

## 2024-01-23 DIAGNOSIS — Z1231 Encounter for screening mammogram for malignant neoplasm of breast: Secondary | ICD-10-CM | POA: Diagnosis not present

## 2024-01-24 NOTE — Unmapped (Signed)
 [facility] Refill Coordination Note    Specialty Medication(s) to be Shipped:   Inflammatory Disorders: Velsipity    Other medication(s) to be shipped: No additional medications requested for fill at this time     Traci Bennett, DOB: 1941-05-04  Phone: [phone] (home)       All above HIPAA information was verified with patient.     Was a Nurse, learning disability used for this call? No    Completed refill call assessment today to schedule patient's medication shipment from the [facility]  [phone]).  All relevant notes have been reviewed.     Specialty medication(s) and dose(s) confirmed: Regimen is correct and unchanged.   Changes to medications: Traci Bennett reports no changes at this time.  Changes to insurance: No  New side effects reported not previously addressed with a pharmacist or physician: None reported  Questions for the pharmacist: No    Confirmed patient received a Conservation officer, historic buildings and a Surveyor, mining with first shipment. The patient will receive a drug information handout for each medication shipped and additional FDA Medication Guides as required.       DISEASE/MEDICATION-SPECIFIC INFORMATION        N/A    SPECIALTY MEDICATION ADHERENCE     Medication Adherence    Patient reported X missed doses in the last month: 0  Specialty Medication: VELSIPITY 2 mg Tab  Patient is on additional specialty medications: No              Were doses missed due to medication being on hold? No    Velsipity 2 mg: 7 days of medicine on hand        REFERRAL TO PHARMACIST     Referral to the pharmacist: Not needed      [facility]     Shipping address confirmed in Epic.       Delivery Scheduled: Yes, Expected medication delivery date: 01/28/24.     Medication will be delivered via UPS to the prescription address in Epic WAM.    Traci Bennett   [facility]  Specialty Technician

## 2024-01-27 DIAGNOSIS — K5100 Ulcerative (chronic) pancolitis without complications: Principal | ICD-10-CM

## 2024-01-27 DIAGNOSIS — H6992 Unspecified Eustachian tube disorder, left ear: Secondary | ICD-10-CM | POA: Diagnosis not present

## 2024-01-27 MED FILL — VELSIPITY 2 MG TABLET: ORAL | 30 days supply | Qty: 30 | Fill #2

## 2024-01-28 DIAGNOSIS — M48061 Spinal stenosis, lumbar region without neurogenic claudication: Secondary | ICD-10-CM | POA: Diagnosis not present

## 2024-01-29 ENCOUNTER — Ambulatory Visit: Payer: Medicare Other

## 2024-01-29 ENCOUNTER — Encounter (HOSPITAL_COMMUNITY): Payer: Self-pay | Admitting: Emergency Medicine

## 2024-01-29 ENCOUNTER — Other Ambulatory Visit: Payer: Self-pay

## 2024-01-29 ENCOUNTER — Emergency Department (HOSPITAL_COMMUNITY)
Admission: EM | Admit: 2024-01-29 | Discharge: 2024-01-30 | Disposition: A | Discharge status: Home / Self Care | Attending: Emergency Medicine | Admitting: Emergency Medicine

## 2024-01-29 DIAGNOSIS — R200 Anesthesia of skin: Secondary | ICD-10-CM

## 2024-01-29 DIAGNOSIS — E039 Hypothyroidism, unspecified: Secondary | ICD-10-CM | POA: Insufficient documentation

## 2024-01-29 DIAGNOSIS — M5412 Radiculopathy, cervical region: Secondary | ICD-10-CM | POA: Insufficient documentation

## 2024-01-29 DIAGNOSIS — Z85828 Personal history of other malignant neoplasm of skin: Secondary | ICD-10-CM | POA: Diagnosis not present

## 2024-01-29 DIAGNOSIS — R5383 Other fatigue: Secondary | ICD-10-CM | POA: Diagnosis not present

## 2024-01-29 DIAGNOSIS — R9089 Other abnormal findings on diagnostic imaging of central nervous system: Secondary | ICD-10-CM | POA: Diagnosis not present

## 2024-01-29 DIAGNOSIS — I10 Essential (primary) hypertension: Secondary | ICD-10-CM | POA: Insufficient documentation

## 2024-01-29 DIAGNOSIS — E559 Vitamin D deficiency, unspecified: Secondary | ICD-10-CM | POA: Diagnosis not present

## 2024-01-29 DIAGNOSIS — M4802 Spinal stenosis, cervical region: Secondary | ICD-10-CM | POA: Diagnosis not present

## 2024-01-29 DIAGNOSIS — R202 Paresthesia of skin: Secondary | ICD-10-CM | POA: Diagnosis not present

## 2024-01-29 DIAGNOSIS — M810 Age-related osteoporosis without current pathological fracture: Secondary | ICD-10-CM | POA: Diagnosis not present

## 2024-01-29 DIAGNOSIS — M5021 Other cervical disc displacement,  high cervical region: Secondary | ICD-10-CM | POA: Diagnosis not present

## 2024-01-29 DIAGNOSIS — I6782 Cerebral ischemia: Secondary | ICD-10-CM | POA: Diagnosis not present

## 2024-01-29 LAB — CBC WITH DIFFERENTIAL/PLATELET
Abs Immature Granulocytes: 0.06 10*3/uL (ref 0.00–0.07)
Basophils Absolute: 0 10*3/uL (ref 0.0–0.1)
Basophils Relative: 1 %
Eosinophils Absolute: 0.1 10*3/uL (ref 0.0–0.5)
Eosinophils Relative: 2 %
HCT: 38.2 % (ref 36.0–46.0)
Hemoglobin: 12.7 g/dL (ref 12.0–15.0)
Immature Granulocytes: 1 %
Lymphocytes Relative: 5 %
Lymphs Abs: 0.3 10*3/uL — ABNORMAL LOW (ref 0.7–4.0)
MCH: 32.1 pg (ref 26.0–34.0)
MCHC: 33.2 g/dL (ref 30.0–36.0)
MCV: 96.5 fL (ref 80.0–100.0)
Monocytes Absolute: 0.6 10*3/uL (ref 0.1–1.0)
Monocytes Relative: 11 %
Neutro Abs: 4.3 10*3/uL (ref 1.7–7.7)
Neutrophils Relative %: 80 %
Platelets: 193 10*3/uL (ref 150–400)
RBC: 3.96 MIL/uL (ref 3.87–5.11)
RDW: 14.6 % (ref 11.5–15.5)
WBC: 5.4 10*3/uL (ref 4.0–10.5)
nRBC: 0 % (ref 0.0–0.2)

## 2024-01-29 NOTE — ED Provider Notes (Signed)
 Emergency Department Provider Note   I have reviewed the triage vital signs and the nursing notes.   HISTORY  Chief Complaint Numbness   HPI Debbie Houston is a 83 y.o. female with PMH of HTN, HLD, and neuropathy presents to the emergency department for evaluation of acute onset hand numbness and decreased coordination.  Triage note describes the left hand affected the patient states that actually seem to be both hands which began suddenly while watching TV.  She had some associated neck pain and headache at the base of her posterior head.  Denies any appreciable weakness.  She states that she had read somewhere that he should stick her tongue out to see if it is deviating and thought it was difficult to point at straight and that it might be veering to the left.  Her husband did not notice any speech disturbance.  She had no lower extremity symptoms other than her baseline neuropathy in her feet.  Her hand symptoms have since started to improve.  She is following with neurology for neck discomfort and concern for cervical radiculopathy but no recent imaging.   Past Medical History:  Diagnosis Date   Arthritis    Benign labile hypertension    Cancer (HCC)    skin cancer on face   Cellulitis    Colitis    DJD (degenerative joint disease)    Endometrial polyp    GERD (gastroesophageal reflux disease)    History of basal cell carcinoma excision    History of esophagitis    HOH (hard of hearing)    right ear better than left  -- wears no aides   Hyperlipidemia    Hypothyroidism    Neuromuscular disorder (HCC)    neuropathy in feet   Osteopenia    Prediabetes    S/P right hip fracture 08/17/2020   Sepsis (HCC) 2023   per patient   Vitamin D deficiency    Wears glasses     Review of Systems  Constitutional: No fever/chills Cardiovascular: Denies chest pain. Respiratory: Denies shortness of breath. Gastrointestinal: No abdominal pain.  No nausea, no vomiting.  Skin:  Negative for rash. Neurological: Negative for focal weakness. Positive numbness to the hands. Positive HA.   ____________________________________________   PHYSICAL EXAM:  VITAL SIGNS: ED Triage Vitals  Encounter Vitals Group     BP 01/29/24 2255 (!) 170/94     Pulse Rate 01/29/24 2255 88     Resp 01/29/24 2255 16     Temp 01/29/24 2255 98.1 F (36.7 C)     Temp src --      SpO2 01/29/24 2255 100 %     Weight 01/29/24 2252 110 lb (49.9 kg)     Height 01/29/24 2252 5\' 6"  (1.676 m)   Constitutional: Alert and oriented. Well appearing and in no acute distress. Eyes: Conjunctivae are normal.  Head: Atraumatic. Nose: No congestion/rhinnorhea. Mouth/Throat: Mucous membranes are moist.  Neck: No stridor.   Cardiovascular: Normal rate, regular rhythm. Good peripheral circulation. Grossly normal heart sounds.   Respiratory: Normal respiratory effort.  No retractions. Lungs CTAB. Gastrointestinal: Soft and nontender. No distention.  Musculoskeletal: No lower extremity tenderness nor edema. No gross deformities of extremities. Neurologic:  Normal speech and language.  5/5 strength in the bilateral upper extremities without pronator drift.  Normal sensation to the face, upper/lower extremities.  Normal to finger-to-nose testing.  No facial asymmetry.  Tongue is midline. Skin:  Skin is warm, dry and intact. No rash  noted.   ____________________________________________   LABS (all labs ordered are listed, but only abnormal results are displayed)  Labs Reviewed  COMPREHENSIVE METABOLIC PANEL - Abnormal; Notable for the following components:      Result Value   Sodium 133 (*)    Glucose, Bld 121 (*)    Total Protein 6.1 (*)    All other components within normal limits  CBC WITH DIFFERENTIAL/PLATELET - Abnormal; Notable for the following components:   Lymphs Abs 0.3 (*)    All other components within normal limits   ____________________________________________  RADIOLOGY  MR  BRAIN WO CONTRAST Result Date: 01/30/2024 CLINICAL DATA:  Acute neurologic deficit EXAM: MRI HEAD WITHOUT CONTRAST MRI CERVICAL SPINE WITHOUT CONTRAST TECHNIQUE: Multiplanar, multiecho pulse sequences of the brain and surrounding structures, and cervical spine, to include the craniocervical junction and cervicothoracic junction, were obtained without intravenous contrast. COMPARISON:  11/06/2023 FINDINGS: MRI HEAD FINDINGS Brain: No acute infarct, mass effect or extra-axial collection. No acute or chronic hemorrhage. There is multifocal hyperintense T2-weighted signal within the white matter. Generalized volume loss. The midline structures are normal. Vascular: Normal flow voids. Skull and upper cervical spine: Normal calvarium and skull base. Visualized upper cervical spine and soft tissues are normal. Sinuses/Orbits:Left mastoid effusion. Ocular lens replacements. Paranasal sinuses are clear MRI CERVICAL SPINE FINDINGS Alignment: Grade 1 anterolisthesis at C4-5 Vertebrae: No fracture, evidence of discitis, or bone lesion. Cord: Normal signal and morphology. Posterior Fossa, vertebral arteries, paraspinal tissues: Negative. Disc levels: C1-2: Unremarkable. C2-3: Normal disc space and facet joints. There is no spinal canal stenosis. No neural foraminal stenosis. C3-4: Small disc bulge. There is no spinal canal stenosis. No neural foraminal stenosis. C4-5: Right uncovertebral hypertrophy. There is no spinal canal stenosis. Mild right neural foraminal stenosis. C5-6: Small disc bulge with bilateral uncovertebral hypertrophy. There is no spinal canal stenosis. Mild right and moderate left neural foraminal stenosis. C6-7: Small disc bulge with endplate spurring. There is no spinal canal stenosis. No neural foraminal stenosis. C7-T1: Normal disc space and facet joints. There is no spinal canal stenosis. No neural foraminal stenosis. IMPRESSION: 1. No acute intracranial abnormality. 2. Chronic small vessel ischemia and  volume loss. 3. Mild right C4-5 and moderate left C5-6 neural foraminal stenosis. 4. No spinal canal stenosis. Electronically Signed   By: Deatra Robinson M.D.   On: 01/30/2024 02:16   MR Cervical Spine Wo Contrast Result Date: 01/30/2024 CLINICAL DATA:  Acute neurologic deficit EXAM: MRI HEAD WITHOUT CONTRAST MRI CERVICAL SPINE WITHOUT CONTRAST TECHNIQUE: Multiplanar, multiecho pulse sequences of the brain and surrounding structures, and cervical spine, to include the craniocervical junction and cervicothoracic junction, were obtained without intravenous contrast. COMPARISON:  11/06/2023 FINDINGS: MRI HEAD FINDINGS Brain: No acute infarct, mass effect or extra-axial collection. No acute or chronic hemorrhage. There is multifocal hyperintense T2-weighted signal within the white matter. Generalized volume loss. The midline structures are normal. Vascular: Normal flow voids. Skull and upper cervical spine: Normal calvarium and skull base. Visualized upper cervical spine and soft tissues are normal. Sinuses/Orbits:Left mastoid effusion. Ocular lens replacements. Paranasal sinuses are clear MRI CERVICAL SPINE FINDINGS Alignment: Grade 1 anterolisthesis at C4-5 Vertebrae: No fracture, evidence of discitis, or bone lesion. Cord: Normal signal and morphology. Posterior Fossa, vertebral arteries, paraspinal tissues: Negative. Disc levels: C1-2: Unremarkable. C2-3: Normal disc space and facet joints. There is no spinal canal stenosis. No neural foraminal stenosis. C3-4: Small disc bulge. There is no spinal canal stenosis. No neural foraminal stenosis. C4-5: Right uncovertebral hypertrophy. There  is no spinal canal stenosis. Mild right neural foraminal stenosis. C5-6: Small disc bulge with bilateral uncovertebral hypertrophy. There is no spinal canal stenosis. Mild right and moderate left neural foraminal stenosis. C6-7: Small disc bulge with endplate spurring. There is no spinal canal stenosis. No neural foraminal stenosis.  C7-T1: Normal disc space and facet joints. There is no spinal canal stenosis. No neural foraminal stenosis. IMPRESSION: 1. No acute intracranial abnormality. 2. Chronic small vessel ischemia and volume loss. 3. Mild right C4-5 and moderate left C5-6 neural foraminal stenosis. 4. No spinal canal stenosis. Electronically Signed   By: Deatra Robinson M.D.   On: 01/30/2024 02:16    ____________________________________________   PROCEDURES  Procedure(s) performed:   Procedures  None  ____________________________________________   INITIAL IMPRESSION / ASSESSMENT AND PLAN / ED COURSE  Pertinent labs & imaging results that were available during my care of the patient were reviewed by me and considered in my medical decision making (see chart for details).   This patient is Presenting for Evaluation of neuro symptoms, which does require a range of treatment options, and is a complaint that involves a high risk of morbidity and mortality.  The Differential Diagnoses includes but is not exclusive to alcohol, illicit or prescription medications, intracranial pathology such as stroke, intracerebral hemorrhage, fever or infectious causes including sepsis, hypoxemia, uremia, trauma, endocrine related disorders such as diabetes, hypoglycemia, thyroid-related diseases, etc.  I did obtain Additional Historical Information from husband at beside.    Clinical Laboratory Tests Ordered, included CMP without acute kidney injury or electrolyte disturbance.  CBC without anemia or leukocytosis.  Radiologic Tests Ordered, included MRI brain and c spine. I independently interpreted the images and agree with radiology interpretation.   Cardiac Monitor Tracing which shows NSR.   Social Determinants of Health Risk patient is a non-smoker.   Medical Decision Making: Summary:  Patient presents emergency department with bilateral hand symptoms beginning suddenly with some neck discomfort.  Lower suspicion for  stroke.  Patient has no focal deficits to prompt code stroke activation.  Plan for MRI brain and C-spine along with screening labs.  Reevaluation with update and discussion with patient.  MRI without stroke.  MRI cervical spine shows mild foraminal stenosis in the C-spine which may be contributing to numbness in the hands.  She has seen Dr. Danielle Dess in the past and will give her contact information for the clinic for follow-up.  Discussed management of symptoms at home, PCP follow-up, neurosurgery follow-up. No cord compression or other acute finding to prompt emergent NSG evaluation.   Patient's presentation is most consistent with acute presentation with potential threat to life or bodily function.   Disposition: discharge  ____________________________________________  FINAL CLINICAL IMPRESSION(S) / ED DIAGNOSES  Final diagnoses:  Numbness  Cervical radiculopathy    Note:  This document was prepared using Dragon voice recognition software and may include unintentional dictation errors.  Alona Bene, MD, [facility]    Twisha Vanpelt, Arlyss Repress, MD 01/30/24 [phone]

## 2024-01-29 NOTE — ED Triage Notes (Addendum)
 Patient arrives via [facility] for left hand numbness and decrease in dexterity that suddenly started at 2145. States that the symptoms have improved but sensation still feels different.    Patient reports headache and dizziness tonight. Patient also reports blurred vision ongoing for weeks but worse tonight. Hx of neuropathy but states that this felt different at first. No weakness or facial droop.

## 2024-01-30 ENCOUNTER — Emergency Department (HOSPITAL_COMMUNITY)

## 2024-01-30 DIAGNOSIS — M4802 Spinal stenosis, cervical region: Secondary | ICD-10-CM | POA: Diagnosis not present

## 2024-01-30 DIAGNOSIS — I6782 Cerebral ischemia: Secondary | ICD-10-CM | POA: Diagnosis not present

## 2024-01-30 DIAGNOSIS — R9089 Other abnormal findings on diagnostic imaging of central nervous system: Secondary | ICD-10-CM | POA: Diagnosis not present

## 2024-01-30 DIAGNOSIS — M5412 Radiculopathy, cervical region: Secondary | ICD-10-CM | POA: Diagnosis not present

## 2024-01-30 DIAGNOSIS — M5021 Other cervical disc displacement,  high cervical region: Secondary | ICD-10-CM | POA: Diagnosis not present

## 2024-01-30 LAB — COMPREHENSIVE METABOLIC PANEL
ALT: 19 U/L (ref 0–44)
AST: 26 U/L (ref 15–41)
Albumin: 3.8 g/dL (ref 3.5–5.0)
Alkaline Phosphatase: 42 U/L (ref 38–126)
Anion gap: 9 (ref 5–15)
BUN: 17 mg/dL (ref 8–23)
CO2: 26 mmol/L (ref 22–32)
Calcium: 9.3 mg/dL (ref 8.9–10.3)
Chloride: 98 mmol/L (ref 98–111)
Creatinine, Ser: 0.88 mg/dL (ref 0.44–1.00)
GFR, Estimated: >60 mL/min (ref >60)
Glucose, Bld: 121 mg/dL — ABNORMAL HIGH (ref 70–99)
Potassium: 4.1 mmol/L (ref 3.5–5.1)
Sodium: 133 mmol/L — ABNORMAL LOW (ref 135–145)
Total Bilirubin: 0.5 mg/dL (ref 0.0–1.2)
Total Protein: 6.1 g/dL — ABNORMAL LOW (ref 6.5–8.1)

## 2024-01-30 NOTE — Discharge Instructions (Signed)
 I think your symptoms may have been from a pinched nerve in your neck. Your scans did not show any sign of a stroke. Please call your PCP tomorrow for an appointment and I have listed the name of the spine clinic that you have seen in the past. Call to schedule an appointment with them as well to review your symptoms and MRI results.

## 2024-02-03 NOTE — Unmapped (Signed)
 Late entry from 01/30/2024.  Patient updated with the following; My blood pressure is running high. I was at the emergency room at [facility] last night with a very high blood pressure .Marland Kitchen192 \ 101.Marland KitchenMarland KitchenMarland KitchenI thought I was having a stroke.Marland KitchenMarland KitchenMarland KitchenI was dizzy, and I had numbness in my hands and feet. The Doctor asked if I was on any medication that.would cause high blood pressure and dizziness. I was wondering if Velsipity could be the problem? They ruled out a stroke...thank goodness!!! What do you think? Should I stop the Velsipity to see if makes a difference? Thanks, Traci Bennett    Dr. Jacqulyn Bath notified. Patient instructed to hold velsipity for now and observe BP daily with log X2weeks, then update.  Follow up with PCP for recheck. Update if changes in GI symptoms occur.  Patient verbalized understanding and in agreement with plan.

## 2024-02-04 ENCOUNTER — Ambulatory Visit: Payer: BLUE CROSS/BLUE SHIELD

## 2024-02-04 DIAGNOSIS — M5412 Radiculopathy, cervical region: Secondary | ICD-10-CM | POA: Diagnosis not present

## 2024-02-04 DIAGNOSIS — G8929 Other chronic pain: Secondary | ICD-10-CM

## 2024-02-04 DIAGNOSIS — R252 Cramp and spasm: Secondary | ICD-10-CM

## 2024-02-04 DIAGNOSIS — M6281 Muscle weakness (generalized): Secondary | ICD-10-CM

## 2024-02-04 DIAGNOSIS — R293 Abnormal posture: Secondary | ICD-10-CM

## 2024-02-04 DIAGNOSIS — M542 Cervicalgia: Secondary | ICD-10-CM | POA: Diagnosis not present

## 2024-02-04 DIAGNOSIS — M25551 Pain in right hip: Secondary | ICD-10-CM

## 2024-02-04 DIAGNOSIS — M25552 Pain in left hip: Secondary | ICD-10-CM

## 2024-02-04 DIAGNOSIS — M5459 Other low back pain: Secondary | ICD-10-CM

## 2024-02-04 DIAGNOSIS — R262 Difficulty in walking, not elsewhere classified: Secondary | ICD-10-CM

## 2024-02-04 DIAGNOSIS — I10 Essential (primary) hypertension: Secondary | ICD-10-CM | POA: Diagnosis not present

## 2024-02-04 NOTE — Therapy (Signed)
 OUTPATIENT PHYSICAL THERAPY TREATMENT NOTE   Patient Name: Debbie [place] MRN: [id number] DOB:07-Oct-1941, 83 y.o., female Today's Date: 02/04/2024  END OF SESSION:  PT End of Session - 02/04/24 1402     Visit Number 8    Date for PT Re-Evaluation 02/18/24    Authorization Type Medicare    Progress Note Due on Visit 10    PT Start Time 1402    PT Stop Time 1445    PT Time Calculation (min) 43 min    Activity Tolerance Patient tolerated treatment well    Behavior During Therapy WFL for tasks assessed/performed              Past Medical History:  Diagnosis Date   Arthritis    Benign labile hypertension    Cancer (HCC)    skin cancer on face   Cellulitis    Colitis    DJD (degenerative joint disease)    Endometrial polyp    GERD (gastroesophageal reflux disease)    History of basal cell carcinoma excision    History of esophagitis    HOH (hard of hearing)    right ear better than left  -- wears no aides   Hyperlipidemia    Hypothyroidism    Neuromuscular disorder (HCC)    neuropathy in feet   Osteopenia    Prediabetes    S/P right hip fracture 08/17/2020   Sepsis (HCC) 2023   per patient   Vitamin D deficiency    Wears glasses    Past Surgical History:  Procedure Laterality Date   BACK SURGERY  2020   BIOPSY  09/27/2023   Procedure: BIOPSY;  Surgeon: Vida Rigger, MD;  Location: [facility] ENDOSCOPY;  Service: Gastroenterology;;   CATARACT EXTRACTION, BILATERAL Bilateral 2020   CONVERSION TO TOTAL HIP Right 01/22/2022   Procedure: CONVERSION TO TOTAL HIP;  Surgeon: Joen Laura, MD;  Location: [facility] ORS;  Service: Orthopedics;  Laterality: Right;   DILATATION & CURETTAGE/HYSTEROSCOPY WITH MYOSURE N/A 10/27/2014   Procedure: DILATATION & CURETTAGE/HYSTEROSCOPY WITH MYOSURE;  Surgeon: Juluis Mire, MD;  Location: [facility];  Service: Gynecology;  Laterality: N/A;   DILATION AND CURETTAGE OF UTERUS     FLEXIBLE SIGMOIDOSCOPY N/A 09/27/2023    Procedure: FLEXIBLE SIGMOIDOSCOPY;  Surgeon: Vida Rigger, MD;  Location: [facility] ENDOSCOPY;  Service: Gastroenterology;  Laterality: N/A;   HIP SURGERY Right 04/2020   rod and screw in right hip    MOHS SURGERY  2005   LEFT NASAL BRIDGE FOR BASAL CELL   TONSILLECTOMY  age 33   Patient Active Problem List   Diagnosis Date Noted   Hyponatremia 09/26/2023   Cellulitis of left buttock 09/26/2023   Proctitis 09/25/2023   History of microscopic colitis 09/25/2023   GERD (gastroesophageal reflux disease) 09/25/2023   Sepsis secondary to UTI (HCC) 06/24/2022   Extensor tenosynovitis of right wrist 06/23/2022   Thrombocytopenia (HCC) 06/23/2022   Prediabetes 06/23/2022   Intertrochanteric fracture of right femur, closed, with nonunion, subsequent encounter 01/22/2022   History of ulcerative colitis 07/10/2021   Complete right bundle branch block (RBBB) 05/15/2021   Body mass index (BMI) of 19.0-19.9 in adult 05/12/2021   Bilateral nephrolithiasis 09/21/2020   Senile purpura (HCC) 08/17/2020   Bilateral temporomandibular joint pain 06/15/2020   Spondylolisthesis at L4-L5 level 11/25/2018   Essential hypertension    Hyperlipidemia    Hypothyroidism    GERD    Asthma    DJD (degenerative joint disease)  Vitamin D deficiency    Osteoporosis with current pathological fracture    Allergy     PCP:   Lucky Cowboy, MD    REFERRING PROVIDER: Melina Fiddler, MD  REFERRING DIAG:  Diagnosis  M17.0 (ICD-10-CM) - Bilateral primary osteoarthritis of knee  R29.898 (ICD-10-CM) - Hip weakness  M54.50 (ICD-10-CM) - Low back pain    THERAPY DIAG:  Abnormal posture  Difficulty in walking, not elsewhere classified  Muscle weakness (generalized)  Chronic pain of both knees  Cramp and spasm  Pain in left hip  Pain in right hip  Other low back pain  Rationale for Evaluation and Treatment: Rehabilitation  ONSET DATE: 12/03/2023  SUBJECTIVE:   SUBJECTIVE STATEMENT: Patient  reports she had "somewhat of an emergency" since she was here last.  She was at home and began having numbness in both arms.  She also tried some facial tests that she had been told to do if she suspected stroke.  She felt she was having some of these symptoms and had to call 911 because her spouse could not drive her due to dementia.  She was transported to hospital and cleared for CVA but CT scan shows severe stenosis.  She is being referred to neurosurgeon but will also being seeing Dr. Cleophas Dunker again before this.  Dr. Cleophas Dunker will be doing what sounds like dexa scan and possibly NCVT for C spine symptoms.     PERTINENT HISTORY: Right hip fx from fall 2 years ago, initial ORIF, then subsequent THA.   PAIN:  02/04/24 Are you having pain? Yes: NPRS scale: 0/10 neck , 0/10 Pain location: knees, hips, low back  Pain description: aching Aggravating factors: prolonged sitting or standing Relieving factors: Tylenol arthritis  PRECAUTIONS: None  RED FLAGS: Bowel or bladder incontinence: Yes: age related    WEIGHT BEARING RESTRICTIONS: No  FALLS:  Has patient fallen in last 6 months? No  LIVING ENVIRONMENT: Lives with: lives with their family and lives with their spouse Lives in: House/apartment Stairs: Yes: Internal: 12 steps; can reach both and External: 4 steps; can reach both Has following equipment at home: None  OCCUPATION: retired  PLOF: Independent, Independent with basic ADLs, Independent with household mobility without device, Independent with community mobility without device, Independent with homemaking with ambulation, Independent with gait, Independent with transfers, Vocation/Vocational requirements: retired, and Leisure: read, watch TV, go out with friends and go to the beach  PATIENT GOALS: to be able to walk and do routine daily activities with manageable pain/ to be able to turn my head and sleep without pain in my neck.   NEXT MD VISIT: na  OBJECTIVE:  Note: Objective  measures were completed at Evaluation unless otherwise noted.  DIAGNOSTIC FINDINGS: na  PATIENT SURVEYS:  Eval:  LEFS 41/80  COGNITION: Overall cognitive status: Within functional limits for tasks assessed     SENSATION: WFL   MUSCLE LENGTH: Hamstrings: Right 40 deg; Left 45 deg Thomas test: Right pos; Left pos  POSTURE: rounded shoulders and forward head  PALPATION: Palpable crepitus bilateral knees on seated flexion / extension  CERVICAL ROM: Flexion: 40 deg Extension: 40 deg SB right : 25 deg SB left : 22 deg Rot right: 40 deg Rot left: 45 deg  UPPER EXTREMITY ROM:     WFL  UPPER EXTREMITY MMT:  Generally 4 to 4+/5  LOWER EXTREMITY ROM:  WFL  LOWER EXTREMITY MMT:  Bilateral hips generally 4-/5, bilateral knees generally 4/5, ankles 5/5  LOWER EXTREMITY SPECIAL  TESTS:  Hip special tests: Luisa Hart (FABER) test: positive  and Thomas test: positive  Knee special tests: did not need this one.  FUNCTIONAL TESTS:  Eval: 5 times sit to stand: 16.49 sec Timed up and go (TUG): 12.18  1/202/2024: 3 minute walk test:  529 ft with RPE of 2-3/10  GAIT: Distance walked: 30 feet Assistive device utilized: None Level of assistance: Complete Independence Comments: antalgic start up then short step length with limited heel strike                                                                                                                                TODAY'S TREATMENT DATE: 02/04/2024 Nustep x 12 min (PT present to discuss status) Tband standing shoulder extension with yellow band 2 x 10 Tband standing shoulder rows with yellow band 2 x 10 Seated bilateral shoulder ER with yellow band 2 x 10 Seated bilateral shoulder horizontal abduction 2 x 10 with red tband Manual C spine ROM, upper trap and levator stretching, STM to upper traps and cervical paraspinals, sub occipital release   DATE: 01/21/2024 UBE x 6 min (3/3) level 1 (PT present to discuss status) 3  way scapular stabilization with blue loop x 5 each UE 4D ball rolls x 20 each direction on each arm (2 lb plyo ball) Wing taps on wall x 10 Thoracic extension seated with purple ball behind back x 10 holding 5 seconds Tband standing shoulder extension with red band 2 x 10 Tband standing shoulder rows with red band 2 x 10 Seated bilateral shoulder ER with red band 2 x 10 Seated bilateral shoulder horizontal abduction 2 x 10 with red tband Palloff press 2 x 10 each side with red tband Seated shoulder flexion  and scaption with 1 lb dumbbells 2 x 10 Trigger Point Dry Needling Subsequent Treatment: Instructions provided previously at initial dry needling treatment.  Patient Verbal Consent Given: Yes Education Handout Provided: Previously Provided Muscles Treated: bilateral upper traps Electrical Stimulation Performed: No Treatment Response/Outcome: Skilled palpation used to identify taut bands and trigger points.  Once identified, dry needling techniques used to treat these areas.  Twitch response ellicited along with palpable elongation of muscle.  Following treatment, patient reported similar soreness to last visit.      DATE: 01/14/2024 UBE x 6 min (3/3) level 1 (PT present to discuss status) 3 way scapular stabilization with blue loop x 5 each UE 4D ball rolls x 20 each direction on each arm Wing taps on wall x 10 Thoracic extension seated with purple ball behind back x 10 holding 2-3 seconds Tband standing shoulder extension with red band 2 x 10 Tband standing shoulder rows with red band 2 x 10 Seated bilateral shoulder ER with red band 2 x 10 Seated bilateral shoulder horizontal abduction 2 x 10 Trigger Point Dry Needling Initial Treatment: Pt instructed on Dry Needling rational, procedures, and possible side effects. Pt instructed to  expect mild to moderate muscle soreness later in the day and/or into the next day.  Pt instructed in methods to reduce muscle soreness. Pt instructed  to continue prescribed HEP. Because Dry Needling was performed over or adjacent to a lung field, pt was educated on S/S of pneumothorax and to seek immediate medical attention should they occur.  Patient was educated on signs and symptoms of infection and other risk factors and advised to seek medical attention should they occur.  Patient verbalized understanding of these instructions and education.  Patient Verbal Consent Given: Yes Education Handout Provided: Previously Provided Muscles Treated: bilateral upper traps Electrical Stimulation Performed: No Treatment Response/Outcome: Skilled palpation used to identify taut bands and trigger points.  Once identified, dry needling techniques used to treat these areas.  Twitch response ellicited along with palpable elongation of muscle.  Following treatment, patient reports minimal soreness.  She was able to do cervical rotation to near [facility]    DATE: 01/07/2024 Reviewed HEP Added cervical HEP including postural strengthening: completed all reps of the following exercises Tband standing shoulder extension with red band 2 x 10 Tband standing shoulder rows with red band 2 x 10 Seated bilateral shoulder ER with red band 2 x 10 Seated bilateral shoulder horizontal abduction 2 x 10 Shoulder rolls x 20 Scapular retraction x 20 Upper trap stretch x 2 holding 10 sec each on each side Levator scapular stretch x 2 holding 10 sec on each side C spine ROM : flexion, retraction, rotation right and left, side bending right and left x 5 each direction Discussed pain control techniques for neck and for knees including ice vs heat   DATE: 12/05/23 Initial eval completed and initiated HEP    PATIENT EDUCATION:  Education details: Initiated HEP Person educated: Patient Education method: Programmer, multimedia, Facilities manager, Verbal cues, and Handouts Education comprehension: verbalized understanding, returned demonstration, and verbal cues required  HOME EXERCISE  PROGRAM: Access Code: [email or web address] URL: [email or web address] Date: 12/09/2023 Prepared by: Clydie Braun Menke  Exercises - Standing Quad Stretch with Table and Chair Support  - 1 x daily - 7 x weekly - 1 sets - 3 reps - 30 sec hold - Seated Piriformis Stretch with Trunk Bend  - 1 x daily - 7 x weekly - 3 reps - 30 sec hold - Seated Piriformis Stretch  - 1 x daily - 7 x weekly - 2 reps - 20 sec hold - Seated Hamstring Stretch  - 1 x daily - 7 x weekly - 2 reps - 20 sec hold - Seated Long Arc Quad  - 1 x daily - 7 x weekly - 2 sets - 10 reps - Seated Pelvic Floor Contraction with Isometric Hip Adduction  - 1 x daily - 7 x weekly - 2 sets - 10 reps - Seated Hip Abduction with Resistance  - 1 x daily - 7 x weekly - 2 sets - 10 reps  ASSESSMENT:  CLINICAL IMPRESSION: Patient seems to have had a moment of stenosis severe enough to cause bilateral UE radicular symptoms/numbness in this case.  She was not sure what was happening and was concerned she was having a stroke.  She went by EMS to ED.  She was cleared for any emergent situation and released with suggestion that she see her orthopedist who could refer her to neurosurgeon.  She was diagnosed with cervical radiculopathy at the ED.  We modified all of her exercises to exclude excessive extension to avoid nerve root or cord compression.  We have  been focusing on the thoracic spine due to it's contributing to her low back pain.  She is developing kyphosis and the fwd trunk is causing more of her low back symptoms.  She did very well today and responded well to manual c spine ROM and continued postural strengthening.    OBJECTIVE IMPAIRMENTS: Abnormal gait, decreased balance, decreased coordination, decreased mobility, difficulty walking, decreased ROM, decreased strength, increased fascial restrictions, increased muscle spasms, impaired flexibility, postural dysfunction, and pain.   ACTIVITY LIMITATIONS: carrying, lifting, bending, sitting,  standing, squatting, sleeping, stairs, transfers, bed mobility, continence, bathing, toileting, dressing, hygiene/grooming, and caring for others  PARTICIPATION LIMITATIONS: meal prep, cleaning, laundry, driving, shopping, community activity, yard work, and church  PERSONAL FACTORS: Age, Fitness, Time since onset of injury/illness/exacerbation, and 3+ comorbidities: previous back surgery, prior hip surgery x 2, Htn, Osteopenia  are also affecting patient's functional outcome.   REHAB POTENTIAL: Fair multiple former orthopedic issues and age  CLINICAL DECISION MAKING: Evolving/moderate complexity  EVALUATION COMPLEXITY: Moderate   GOALS: Goals reviewed with patient? Yes  SHORT TERM GOALS: Target date: 01/02/2024 ( C spine goal date 02/05/2024)   Pain report to be no greater than 4/10  Baseline: Goal status: MET for knees ( will continue to work on Cspine pain level)  2.  Patient will be independent with initial HEP for knees and Cspine Baseline:  Goal status: MET 01/21/24   LONG TERM GOALS: Target date: 01/30/2024 (C spine long term goal date 02/19/2024)   Patient to report pain no greater than 2/10 Baseline:  Goal status:  (knee goal met, will leave in place for c spine)  2.  Patient to be independent with advanced HEP  Baseline:  Goal status: (knee goal met, will leave in place for c spine)  3.  Patient to be able to bend, stoop and squat with pain no greater than 2/10  Baseline:  Goal status: In progress (knees)  4.  Patient to be able to ascend and descend steps without pain or no greater than 2/10  Baseline:  Goal status: In progress (knees)  5.  Patient to be able to stand or walk for at least 15 min without leg pain  Baseline:  Goal status: In progress (knees)  6.  LEFS score to improve by 2-4 points Baseline:  Goal status: In progress  7. Patient to be able to sleep through the night   Baseline:  Goal status: NEW  8. Patient to demonstrate full Cspine ROM  without pain  Baseline:  Goal status: NEW   PLAN:  PT FREQUENCY: 1-2x/week  PT DURATION: 8 weeks  PLANNED INTERVENTIONS: 97110-Therapeutic exercises, 97530- Therapeutic activity, [id number]- Neuromuscular re-education, 97535- Self Care, 11914- Manual therapy, [id number]- Gait training, [phone]- Aquatic Therapy, 97014- Electrical stimulation (unattended), [id number]- Electrical stimulation (manual), 97016- Vasopneumatic device, [id number]- Ultrasound, [id number]- Ionotophoresis 4mg /ml Dexamethasone, Patient/Family education, Balance training, Stair training, Taping, Dry Needling, Joint mobilization, Spinal mobilization, Compression bandaging, Vestibular training, Visual/preceptual remediation/compensation, DME instructions, Cryotherapy, and Moist heat  PLAN FOR NEXT SESSION: UBE, postural strengthening, avoid over extension C spine.      Victorino Dike B. Tyrie Porzio, PT 02/04/24 9:01 PM [facility] [address], [address] [place], [place] [address] Phone # [phone] Fax [phone]

## 2024-02-05 DIAGNOSIS — I10 Essential (primary) hypertension: Secondary | ICD-10-CM | POA: Diagnosis not present

## 2024-02-05 DIAGNOSIS — R350 Frequency of micturition: Secondary | ICD-10-CM | POA: Diagnosis not present

## 2024-02-06 DIAGNOSIS — H6992 Unspecified Eustachian tube disorder, left ear: Secondary | ICD-10-CM | POA: Diagnosis not present

## 2024-02-06 DIAGNOSIS — M545 Low back pain, unspecified: Secondary | ICD-10-CM | POA: Diagnosis not present

## 2024-02-06 DIAGNOSIS — H6121 Impacted cerumen, right ear: Secondary | ICD-10-CM | POA: Diagnosis not present

## 2024-02-06 DIAGNOSIS — H903 Sensorineural hearing loss, bilateral: Secondary | ICD-10-CM | POA: Diagnosis not present

## 2024-02-11 ENCOUNTER — Ambulatory Visit (HOSPITAL_BASED_OUTPATIENT_CLINIC_OR_DEPARTMENT_OTHER): Payer: BLUE CROSS/BLUE SHIELD | Admitting: Family Medicine

## 2024-02-11 ENCOUNTER — Ambulatory Visit: Payer: BLUE CROSS/BLUE SHIELD

## 2024-02-11 DIAGNOSIS — M25552 Pain in left hip: Secondary | ICD-10-CM

## 2024-02-11 DIAGNOSIS — R293 Abnormal posture: Secondary | ICD-10-CM

## 2024-02-11 DIAGNOSIS — R262 Difficulty in walking, not elsewhere classified: Secondary | ICD-10-CM

## 2024-02-11 DIAGNOSIS — M542 Cervicalgia: Secondary | ICD-10-CM | POA: Diagnosis not present

## 2024-02-11 DIAGNOSIS — M25551 Pain in right hip: Secondary | ICD-10-CM

## 2024-02-11 DIAGNOSIS — R252 Cramp and spasm: Secondary | ICD-10-CM

## 2024-02-11 DIAGNOSIS — G8929 Other chronic pain: Secondary | ICD-10-CM

## 2024-02-11 DIAGNOSIS — M6281 Muscle weakness (generalized): Secondary | ICD-10-CM

## 2024-02-11 DIAGNOSIS — M5459 Other low back pain: Secondary | ICD-10-CM

## 2024-02-11 NOTE — Therapy (Unsigned)
 OUTPATIENT PHYSICAL THERAPY TREATMENT NOTE   Patient Name: Debbie Houston MRN: [id number] DOB:[birth year], 83 y.o., female Today's Date: 02/12/2024  END OF SESSION:  PT End of Session - 02/11/24 1402     Visit Number 9    Date for PT Re-Evaluation 02/18/24    Authorization Type Medicare    Progress Note Due on Visit 10    PT Start Time 1404    PT Stop Time 1445    PT Time Calculation (min) 41 min    Activity Tolerance Patient tolerated treatment well    Behavior During Therapy WFL for tasks assessed/performed              Past Medical History:  Diagnosis Date   Arthritis    Benign labile hypertension    Cancer (HCC)    skin cancer on face   Cellulitis    Colitis    DJD (degenerative joint disease)    Endometrial polyp    GERD (gastroesophageal reflux disease)    History of basal cell carcinoma excision    History of esophagitis    HOH (hard of hearing)    right ear better than left  -- wears no aides   Hyperlipidemia    Hypothyroidism    Neuromuscular disorder (HCC)    neuropathy in feet   Osteopenia    Prediabetes    S/P right hip fracture 08/17/2020   Sepsis (HCC) 2023   per patient   Vitamin D deficiency    Wears glasses    Past Surgical History:  Procedure Laterality Date   BACK SURGERY  2020   BIOPSY  09/27/2023   Procedure: BIOPSY;  Surgeon: Vida Rigger, MD;  Location: [facility] ENDOSCOPY;  Service: Gastroenterology;;   CATARACT EXTRACTION, BILATERAL Bilateral 2020   CONVERSION TO TOTAL HIP Right 01/22/2022   Procedure: CONVERSION TO TOTAL HIP;  Surgeon: Joen Laura, MD;  Location: [facility]S;  Service: Orthopedics;  Laterality: Right;   DILATATION & CURETTAGE/HYSTEROSCOPY WITH MYOSURE N/A 10/27/2014   Procedure: DILATATION & CURETTAGE/HYSTEROSCOPY WITH MYOSURE;  Surgeon: Juluis Mire, MD;  Location: [facility];  Service: Gynecology;  Laterality: N/A;   DILATION AND CURETTAGE OF UTERUS     FLEXIBLE SIGMOIDOSCOPY N/A 09/27/2023    Procedure: FLEXIBLE SIGMOIDOSCOPY;  Surgeon: Vida Rigger, MD;  Location: [facility] ENDOSCOPY;  Service: Gastroenterology;  Laterality: N/A;   HIP SURGERY Right 04/2020   rod and screw in right hip    MOHS SURGERY  2005   LEFT NASAL BRIDGE FOR BASAL CELL   TONSILLECTOMY  age 35   Patient Active Problem List   Diagnosis Date Noted   Hyponatremia 09/26/2023   Cellulitis of left buttock 09/26/2023   Proctitis 09/25/2023   History of microscopic colitis 09/25/2023   GERD (gastroesophageal reflux disease) 09/25/2023   Sepsis secondary to UTI (HCC) 06/24/2022   Extensor tenosynovitis of right wrist 06/23/2022   Thrombocytopenia (HCC) 06/23/2022   Prediabetes 06/23/2022   Intertrochanteric fracture of right femur, closed, with nonunion, subsequent encounter 01/22/2022   History of ulcerative colitis 07/10/2021   Complete right bundle branch block (RBBB) 05/15/2021   Body mass index (BMI) of 19.0-19.9 in adult 05/12/2021   Bilateral nephrolithiasis 09/21/2020   Senile purpura (HCC) 08/17/2020   Bilateral temporomandibular joint pain 06/15/2020   Spondylolisthesis at L4-L5 level 11/25/2018   Essential hypertension    Hyperlipidemia    Hypothyroidism    GERD    Asthma    DJD (degenerative joint disease)  Vitamin D deficiency    Osteoporosis with current pathological fracture    Allergy     PCP:   Lucky Cowboy, MD    REFERRING PROVIDER: Melina Fiddler, MD  REFERRING DIAG:  Diagnosis  M17.0 (ICD-10-CM) - Bilateral primary osteoarthritis of knee  R29.898 (ICD-10-CM) - Hip weakness  M54.50 (ICD-10-CM) - Low back pain    THERAPY DIAG:  Abnormal posture  Difficulty in walking, not elsewhere classified  Muscle weakness (generalized)  Chronic pain of both knees  Cramp and spasm  Pain in right hip  Pain in left hip  Other low back pain  Cervicalgia  Rationale for Evaluation and Treatment: Rehabilitation  ONSET DATE: 12/03/2023  SUBJECTIVE:   SUBJECTIVE  STATEMENT: Patient reports she has not had any more issues since last visit with regard to the numbness in her arms.  No pain at the moment.   PERTINENT HISTORY: Right hip fx from fall 2 years ago, initial ORIF, then subsequent THA.   PAIN:  02/11/24 Are you having pain? Yes: NPRS scale: 0/10 neck , 0/10 Pain location: knees, hips, low back  Pain description: aching Aggravating factors: prolonged sitting or standing Relieving factors: Tylenol arthritis  PRECAUTIONS: None  RED FLAGS: Bowel or bladder incontinence: Yes: age related    WEIGHT BEARING RESTRICTIONS: No  FALLS:  Has patient fallen in last 6 months? No  LIVING ENVIRONMENT: Lives with: lives with their family and lives with their spouse Lives in: House/apartment Stairs: Yes: Internal: 12 steps; can reach both and External: 4 steps; can reach both Has following equipment at home: None  OCCUPATION: retired  PLOF: Independent, Independent with basic ADLs, Independent with household mobility without device, Independent with community mobility without device, Independent with homemaking with ambulation, Independent with gait, Independent with transfers, Vocation/Vocational requirements: retired, and Leisure: read, watch TV, go out with friends and go to the beach  PATIENT GOALS: to be able to walk and do routine daily activities with manageable pain/ to be able to turn my head and sleep without pain in my neck.   NEXT MD VISIT: na  OBJECTIVE:  Note: Objective measures were completed at Evaluation unless otherwise noted.  DIAGNOSTIC FINDINGS: na  PATIENT SURVEYS:  Eval:  LEFS 41/80  COGNITION: Overall cognitive status: Within functional limits for tasks assessed     SENSATION: WFL   MUSCLE LENGTH: Hamstrings: Right 40 deg; Left 45 deg Thomas test: Right pos; Left pos  POSTURE: rounded shoulders and forward head  PALPATION: Palpable crepitus bilateral knees on seated flexion / extension  CERVICAL  ROM: Flexion: 40 deg Extension: 40 deg SB right : 25 deg SB left : 22 deg Rot right: 40 deg Rot left: 45 deg  UPPER EXTREMITY ROM:     WFL  UPPER EXTREMITY MMT:  Generally 4 to 4+/5  LOWER EXTREMITY ROM:  WFL  LOWER EXTREMITY MMT:  Bilateral hips generally 4-/5, bilateral knees generally 4/5, ankles 5/5  LOWER EXTREMITY SPECIAL TESTS:  Hip special tests: Luisa Hart (FABER) test: positive  and Thomas test: positive  Knee special tests: did not need this one.  FUNCTIONAL TESTS:  Eval: 5 times sit to stand: 16.49 sec Timed up and go (TUG): 12.18  1/202/2024: 3 minute walk test:  529 ft with RPE of 2-3/10  GAIT: Distance walked: 30 feet Assistive device utilized: None Level of assistance: Complete Independence Comments: antalgic start up then short step length with limited heel strike  TODAY'S TREATMENT DATE: 02/11/2024 Nustep x 5 min  Level 3 (PT present to discuss status) Tband standing shoulder extension with yellow band 2 x 10 Tband standing shoulder rows with yellow band 2 x 10 Seated bilateral shoulder ER with yellow band 2 x 10 Seated bilateral shoulder horizontal abduction 2 x 10 with red tband Fwd bent shoulder extension with 1 lb 2 x 10 both Fwd bent shoulder row with 1 lb 2 x 10 both Fwd bent horizontal abduction 1 lb 2 x 10 both Side lying shoulder ER with 1 lb 2 x 10 both Supine serratus punch with 1 lb 2 x 10 MELT rotations x 2 min, nods x 2 min,   DATE: 02/04/2024 Nustep x 12 min (PT present to discuss status) Tband standing shoulder extension with yellow band 2 x 10 Tband standing shoulder rows with yellow band 2 x 10 Seated bilateral shoulder ER with yellow band 2 x 10 Seated bilateral shoulder horizontal abduction 2 x 10 with red tband Manual C spine ROM, upper trap and levator stretching, STM to upper traps and cervical  paraspinals, sub occipital release   DATE: 01/21/2024 UBE x 6 min (3/3) level 1 (PT present to discuss status) 3 way scapular stabilization with blue loop x 5 each UE 4D ball rolls x 20 each direction on each arm (2 lb plyo ball) Wing taps on wall x 10 Thoracic extension seated with purple ball behind back x 10 holding 5 seconds Tband standing shoulder extension with red band 2 x 10 Tband standing shoulder rows with red band 2 x 10 Seated bilateral shoulder ER with red band 2 x 10 Seated bilateral shoulder horizontal abduction 2 x 10 with red tband Palloff press 2 x 10 each side with red tband Seated shoulder flexion  and scaption with 1 lb dumbbells 2 x 10 Trigger Point Dry Needling Subsequent Treatment: Instructions provided previously at initial dry needling treatment.  Patient Verbal Consent Given: Yes Education Handout Provided: Previously Provided Muscles Treated: bilateral upper traps Electrical Stimulation Performed: No Treatment Response/Outcome: Skilled palpation used to identify taut bands and trigger points.  Once identified, dry needling techniques used to treat these areas.  Twitch response ellicited along with palpable elongation of muscle.  Following treatment, patient reported similar soreness to last visit.     DATE: 12/05/23 Initial eval completed and initiated HEP    PATIENT EDUCATION:  Education details: Initiated HEP Person educated: Patient Education method: Programmer, multimedia, Facilities manager, Verbal cues, and Handouts Education comprehension: verbalized understanding, returned demonstration, and verbal cues required  HOME EXERCISE PROGRAM: Access Code: [email or web address] URL: [email or web address] Date: 12/09/2023 Prepared by: Clydie Braun Menke  Exercises - Standing Quad Stretch with Table and Chair Support  - 1 x daily - 7 x weekly - 1 sets - 3 reps - 30 sec hold - Seated Piriformis Stretch with Trunk Bend  - 1 x daily - 7 x weekly - 3 reps - 30 sec hold -  Seated Piriformis Stretch  - 1 x daily - 7 x weekly - 2 reps - 20 sec hold - Seated Hamstring Stretch  - 1 x daily - 7 x weekly - 2 reps - 20 sec hold - Seated Long Arc Quad  - 1 x daily - 7 x weekly - 2 sets - 10 reps - Seated Pelvic Floor Contraction with Isometric Hip Adduction  - 1 x daily - 7 x weekly - 2 sets - 10 reps - Seated Hip Abduction with Resistance  - 1 x daily -  7 x weekly - 2 sets - 10 reps  ASSESSMENT:  CLINICAL IMPRESSION: Keyia was able to tolerate addition of scapular stabilization exercises.  She continues to demonstrated fwd bent posture but is much more aware and corrects herself without cueing.  She would benefit from continuing skilled PT to address goals below.     OBJECTIVE IMPAIRMENTS: Abnormal gait, decreased balance, decreased coordination, decreased mobility, difficulty walking, decreased ROM, decreased strength, increased fascial restrictions, increased muscle spasms, impaired flexibility, postural dysfunction, and pain.   ACTIVITY LIMITATIONS: carrying, lifting, bending, sitting, standing, squatting, sleeping, stairs, transfers, bed mobility, continence, bathing, toileting, dressing, hygiene/grooming, and caring for others  PARTICIPATION LIMITATIONS: meal prep, cleaning, laundry, driving, shopping, community activity, yard work, and church  PERSONAL FACTORS: Age, Fitness, Time since onset of injury/illness/exacerbation, and 3+ comorbidities: previous back surgery, prior hip surgery x 2, Htn, Osteopenia  are also affecting patient's functional outcome.   REHAB POTENTIAL: Fair multiple former orthopedic issues and age  CLINICAL DECISION MAKING: Evolving/moderate complexity  EVALUATION COMPLEXITY: Moderate   GOALS: Goals reviewed with patient? Yes  SHORT TERM GOALS: Target date: 01/02/2024 ( C spine goal date 02/05/2024)   Pain report to be no greater than 4/10  Baseline: Goal status: MET for knees ( will continue to work on Cspine pain level)  2.   Patient will be independent with initial HEP for knees and Cspine Baseline:  Goal status: MET 01/21/24   LONG TERM GOALS: Target date: 01/30/2024 (C spine long term goal date 02/19/2024)   Patient to report pain no greater than 2/10 Baseline:  Goal status:  (knee goal met, will leave in place for c spine)  2.  Patient to be independent with advanced HEP  Baseline:  Goal status: (knee goal met, will leave in place for c spine)  3.  Patient to be able to bend, stoop and squat with pain no greater than 2/10  Baseline:  Goal status: In progress (knees)  4.  Patient to be able to ascend and descend steps without pain or no greater than 2/10  Baseline:  Goal status: In progress (knees)  5.  Patient to be able to stand or walk for at least 15 min without leg pain  Baseline:  Goal status: In progress (knees)  6.  LEFS score to improve by 2-4 points Baseline:  Goal status: In progress  7. Patient to be able to sleep through the night   Baseline:  Goal status: NEW  8. Patient to demonstrate full Cspine ROM without pain  Baseline:  Goal status: NEW   PLAN:  PT FREQUENCY: 1-2x/week  PT DURATION: 8 weeks  PLANNED INTERVENTIONS: 97110-Therapeutic exercises, 97530- Therapeutic activity, [id number]- Neuromuscular re-education, 97535- Self Care, [id number]- Manual therapy, [id number]- Gait training, [phone]- Aquatic Therapy, 97014- Electrical stimulation (unattended), [id number]- Electrical stimulation (manual), 97016- Vasopneumatic device, [id number]- Ultrasound, [id number]- Ionotophoresis 4mg /ml Dexamethasone, Patient/Family education, Balance training, Stair training, Taping, Dry Needling, Joint mobilization, Spinal mobilization, Compression bandaging, Vestibular training, Visual/preceptual remediation/compensation, DME instructions, Cryotherapy, and Moist heat  PLAN FOR NEXT SESSION: UBE, postural strengthening, avoid over extension C spine.      Victorino Dike B. Karema Tocci, PT 02/12/24 7:20 AM [facility]  [facility] [address] [place], [place] [address] Phone # [phone] Fax [phone]

## 2024-02-12 ENCOUNTER — Telehealth: Payer: Self-pay | Admitting: *Deleted

## 2024-02-12 ENCOUNTER — Ambulatory Visit (INDEPENDENT_AMBULATORY_CARE_PROVIDER_SITE_OTHER): Admitting: Family Medicine

## 2024-02-12 VITALS — BP 140/87 | Ht 65.0 in | Wt 110.0 lb

## 2024-02-12 DIAGNOSIS — M8000XG Age-related osteoporosis with current pathological fracture, unspecified site, subsequent encounter for fracture with delayed healing: Secondary | ICD-10-CM

## 2024-02-12 NOTE — Telephone Encounter (Addendum)
 Patient is ready for scheduling on or after: 03/02/24  BUY AND BILL  Out-of-pocket cost due at time of visit: $0  Primary: [employer] co-insurance: 20% (approximately $333.71) Admin fee co-insurance: $20 copay  Deductible: n/a  Prior Auth: not req'd  Secondary: BC[employer] med supp phone #: [phone] This is a medicare supplement plan. It covers all remaining charges. No OOP max or copay applies.   Deductible: n/a  Prior Auth: not req'd  PA# Valid:   ** This summary of benefits is an estimation of the patient's out-of-pocket cost. Exact cost may vary based on individual plan coverage.

## 2024-02-12 NOTE — Progress Notes (Signed)
 PCP: Sabino Dick, DO  Subjective:   HPI: Patient is a 83 y.o. female here for osteoporosis evaluation.  Patient completed Evenity just less than a year ago at Dr. Sydnee Cabal office and did well with improvement especially in lumbar spine bone density.  She has not started other medication since. Has followed up with Dr. Cleophas Dunker this month - both calcium and vitamin D were normal. Prior treatment: evenity History of Hip, Spine, or Wrist Fracture: L3 compression fracture, hip fracture Heart disease or stroke: no Cancer: yes, skin cancer Kidney Disease: no Gastric/Peptic Ulcer: no Gastric bypass surgery: no Severe GERD: yes but not severe History of seizures: no Age at Menopause: ~40 Calcium intake: 1200mg  daily Vitamin D intake: yes but unsure of  Hormone replacement therapy: yes Smoking history: former - 1 pack year Alcohol: 0 Exercise: walking daily Major dental work in past year: no Parents with hip/spine fracture: not sure  Past Medical History:  Diagnosis Date   Arthritis    Benign labile hypertension    Cancer (HCC)    skin cancer on face   Cellulitis    Colitis    DJD (degenerative joint disease)    Endometrial polyp    GERD (gastroesophageal reflux disease)    History of basal cell carcinoma excision    History of esophagitis    HOH (hard of hearing)    right ear better than left  -- wears no aides   Hyperlipidemia    Hypothyroidism    Neuromuscular disorder (HCC)    neuropathy in feet   Osteopenia    Prediabetes    S/P right hip fracture 08/17/2020   Sepsis (HCC) 2023   per patient   Vitamin D deficiency    Wears glasses     Current Outpatient Medications on File Prior to Visit  Medication Sig Dispense Refill   acetaminophen (TYLENOL) 500 MG tablet Take 500 mg by mouth as needed for moderate pain (pain score 4-6) or headache.     Ascorbic Acid (VITAMIN C) 1000 MG tablet Take 1,000 mg by mouth daily.      aspirin EC 81 MG tablet Take 1 tablet  (81 mg total) by mouth in the morning and at bedtime. (Patient taking differently: Take 81 mg by mouth at bedtime.) 60 tablet 0   bisoprolol (ZEBETA) 5 MG tablet Take 1 tablet every Morning for BP (Patient taking differently: Take 5 mg by mouth at bedtime.) 90 tablet 3   Calcium Carbonate-Vit D-Min (CALCIUM 1200 PO) Take 1,200 mg by mouth daily.     cetirizine (ZYRTEC ALLERGY) 10 MG tablet Take 10 mg by mouth daily as needed for allergies.     CINNAMON PO Take 2,000 mg by mouth daily.     clotrimazole-betamethasone (LOTRISONE) cream Apply 1 Application topically 2 (two) times daily. 15 g 2   Coenzyme Q10 (COQ10) 200 MG CAPS Take 200 mg by mouth daily.     COLESTIPOL HCL PO Take 2 tablets by mouth in the morning and at bedtime.     cycloSPORINE (RESTASIS) 0.05 % ophthalmic emulsion Place 2 drops into both eyes 2 (two) times daily.     Etrasimod Arginine (VELSIPITY) 2 MG TABS Take 2 mg by mouth daily. (Patient taking differently: Take 2 mg by mouth daily. On Hold)     Fluticasone Propionate (FLONASE NA) Place 1 spray into the nose as needed (allergies).     levothyroxine (SYNTHROID) 88 MCG tablet Take 1 tab daily except M-W-F take 1/2 tab 45 tablet  3   loperamide (IMODIUM) 2 MG capsule Take 2 mg by mouth 2 (two) times daily as needed for diarrhea or loose stools.     meclizine (ANTIVERT) 25 MG tablet 1/2-1 pill up to 3 times daily for motion sickness/dizziness (Patient taking differently: Take 25 mg by mouth as needed for dizziness or nausea.) 30 tablet 0   mesalamine (LIALDA) 1.2 g EC tablet Take 2.4 g by mouth in the morning and at bedtime.     Multiple Vitamin (THERA VITAMIN PO) Take 1 tablet by mouth daily.     Omega-3 Fatty Acids (FISH OIL) 1200 MG CAPS Take 1,200 mg by mouth daily.      pantoprazole (PROTONIX) 40 MG tablet Take 1 tablet  2 x /day  for Heartburn & Indigestion (Patient taking differently: Take 40 mg by mouth 2 (two) times daily.) 180 tablet 0   Probiotic Product (PROBIOTIC PO)  Take 1 capsule by mouth daily.     PROLIA 60 MG/ML SOSY injection Inject 60 mg into the skin every 6 (six) months.     pseudoephedrine (SUDAFED) 120 MG 12 hr tablet Take  1 tablet  2 x /day (every 12 hours)  for Head and Chest Congestion (Patient taking differently: Take 120 mg by mouth as needed for congestion.) 60 tablet 0   simvastatin (ZOCOR) 20 MG tablet Take 1 tablet at Bedtime for Cholesterol 90 tablet 3   triamcinolone ointment (KENALOG) 0.5 % Apply 1 Application topically 2 (two) times daily. 30 g 0   vancomycin (VANCOCIN) 125 MG capsule Take 125 mg by mouth in the morning and at bedtime.     No current facility-administered medications on file prior to visit.    Past Surgical History:  Procedure Laterality Date   BACK SURGERY  2020   BIOPSY  09/27/2023   Procedure: BIOPSY;  Surgeon: Vida Rigger, MD;  Location: [facility] ENDOSCOPY;  Service: Gastroenterology;;   CATARACT EXTRACTION, BILATERAL Bilateral 2020   CONVERSION TO TOTAL HIP Right 01/22/2022   Procedure: CONVERSION TO TOTAL HIP;  Surgeon: Joen Laura, MD;  Location: [facility];  Service: Orthopedics;  Laterality: Right;   DILATATION & CURETTAGE/HYSTEROSCOPY WITH MYOSURE N/A 10/27/2014   Procedure: DILATATION & CURETTAGE/HYSTEROSCOPY WITH MYOSURE;  Surgeon: Juluis Mire, MD;  Location: [facility];  Service: Gynecology;  Laterality: N/A;   DILATION AND CURETTAGE OF UTERUS     FLEXIBLE SIGMOIDOSCOPY N/A 09/27/2023   Procedure: FLEXIBLE SIGMOIDOSCOPY;  Surgeon: Vida Rigger, MD;  Location: [facility] ENDOSCOPY;  Service: Gastroenterology;  Laterality: N/A;   HIP SURGERY Right 04/2020   rod and screw in right hip    MOHS SURGERY  2005   LEFT NASAL BRIDGE FOR BASAL CELL   TONSILLECTOMY  age 1    Allergies  Allergen Reactions   Remicade [Infliximab] Other (See Comments)    Unknown    Fosamax [Alendronate Sodium] Other (See Comments)    GI upset    Montelukast Other (See Comments)    Reaction:  Makes pt jittery     Reaction:  Makes pt jittery  Reaction:  Makes pt jittery Makes pt jittery  Makes pt jittery   Singulair [Montelukast Sodium] Other (See Comments)    Makes pt jittery   Wellbutrin [Bupropion] Hives   Clindamycin/Lincomycin Rash   Sulfa Antibiotics Rash    BP (!) 140/87   Ht 5\' 5"  (1.651 m)   Wt 110 lb (49.9 kg)   BMI 18.30 kg/m       No data  to display              No data to display              Objective:  Physical Exam:  Gen: NAD, comfortable in exam room  25-OH Vit D - 56 CMP - calcium 9.5.  Globulin and sodium slightly low, otherwise normal CMP Dexa 02/04/23: L fem neck -1.7, L total femur -1.8 (from -2.4), AP spine -0.90  Assessment & Plan:  1. Severe osteoporosis - with history of L3 compression fracture and hip fracture, high risk.  Completed evenity treatment - transition to prolia to maintain improved bone density.  Calcium and vitamin D levels were normal.  Encouraged oral intake of these.    Total visit time 25 minutes including documentation.

## 2024-02-12 NOTE — Patient Instructions (Signed)
 We will look into the prolia for you with insurance. Take calcium 1200mg  daily, vitamin D 800 international units daily. You'll need to take prolia every 6 months.  About 1-2 weeks before you have this done each time we need to check your calcium and vitamin D.

## 2024-02-17 ENCOUNTER — Encounter: Payer: Self-pay | Admitting: Neurology

## 2024-02-18 ENCOUNTER — Ambulatory Visit: Payer: BLUE CROSS/BLUE SHIELD | Attending: Sports Medicine

## 2024-02-18 DIAGNOSIS — M5459 Other low back pain: Secondary | ICD-10-CM | POA: Diagnosis present

## 2024-02-18 DIAGNOSIS — R262 Difficulty in walking, not elsewhere classified: Secondary | ICD-10-CM | POA: Insufficient documentation

## 2024-02-18 DIAGNOSIS — M25562 Pain in left knee: Secondary | ICD-10-CM | POA: Insufficient documentation

## 2024-02-18 DIAGNOSIS — R002 Palpitations: Secondary | ICD-10-CM | POA: Diagnosis not present

## 2024-02-18 DIAGNOSIS — G8929 Other chronic pain: Secondary | ICD-10-CM | POA: Diagnosis present

## 2024-02-18 DIAGNOSIS — M25551 Pain in right hip: Secondary | ICD-10-CM | POA: Insufficient documentation

## 2024-02-18 DIAGNOSIS — M6281 Muscle weakness (generalized): Secondary | ICD-10-CM | POA: Diagnosis present

## 2024-02-18 DIAGNOSIS — M25561 Pain in right knee: Secondary | ICD-10-CM | POA: Diagnosis present

## 2024-02-18 DIAGNOSIS — M25552 Pain in left hip: Secondary | ICD-10-CM | POA: Insufficient documentation

## 2024-02-18 DIAGNOSIS — M542 Cervicalgia: Secondary | ICD-10-CM | POA: Diagnosis present

## 2024-02-18 DIAGNOSIS — R293 Abnormal posture: Secondary | ICD-10-CM | POA: Diagnosis present

## 2024-02-18 DIAGNOSIS — R42 Dizziness and giddiness: Secondary | ICD-10-CM | POA: Diagnosis not present

## 2024-02-18 DIAGNOSIS — I10 Essential (primary) hypertension: Secondary | ICD-10-CM | POA: Diagnosis not present

## 2024-02-18 DIAGNOSIS — R252 Cramp and spasm: Secondary | ICD-10-CM | POA: Diagnosis present

## 2024-02-18 NOTE — Therapy (Addendum)
 OUTPATIENT PHYSICAL THERAPY TREATMENT NOTE   Patient Name: Debbie Houston MRN: [id number] DOB:07/27/1941, 83 y.o., female Today's Date: 02/18/2024  END OF SESSION:  PT End of Session - 02/18/24 1409     Visit Number 10    Date for PT Re-Evaluation 04/14/24    Authorization Type Medicare    Progress Note Due on Visit 20    PT Start Time 1400    PT Stop Time 1447    PT Time Calculation (min) 47 min    Activity Tolerance Patient tolerated treatment well    Behavior During Therapy WFL for tasks assessed/performed              Past Medical History:  Diagnosis Date   Arthritis    Benign labile hypertension    Cancer (HCC)    skin cancer on face   Cellulitis    Colitis    DJD (degenerative joint disease)    Endometrial polyp    GERD (gastroesophageal reflux disease)    History of basal cell carcinoma excision    History of esophagitis    HOH (hard of hearing)    right ear better than left  -- wears no aides   Hyperlipidemia    Hypothyroidism    Neuromuscular disorder (HCC)    neuropathy in feet   Osteopenia    Prediabetes    S/P right hip fracture 08/17/2020   Sepsis (HCC) 2023   per patient   Vitamin D deficiency    Wears glasses    Past Surgical History:  Procedure Laterality Date   BACK SURGERY  2020   BIOPSY  09/27/2023   Procedure: BIOPSY;  Surgeon: Vida Rigger, MD;  Location: [facility] ENDOSCOPY;  Service: Gastroenterology;;   CATARACT EXTRACTION, BILATERAL Bilateral 2020   CONVERSION TO TOTAL HIP Right 01/22/2022   Procedure: CONVERSION TO TOTAL HIP;  Surgeon: Joen Laura, MD;  Location: [facility] ORS;  Service: Orthopedics;  Laterality: Right;   DILATATION & CURETTAGE/HYSTEROSCOPY WITH MYOSURE N/A 10/27/2014   Procedure: DILATATION & CURETTAGE/HYSTEROSCOPY WITH MYOSURE;  Surgeon: Juluis Mire, MD;  Location: [facility] [place];  Service: Gynecology;  Laterality: N/A;   DILATION AND CURETTAGE OF UTERUS     FLEXIBLE SIGMOIDOSCOPY N/A 09/27/2023    Procedure: FLEXIBLE SIGMOIDOSCOPY;  Surgeon: Vida Rigger, MD;  Location: [facility] ENDOSCOPY;  Service: Gastroenterology;  Laterality: N/A;   HIP SURGERY Right 04/2020   rod and screw in right hip    MOHS SURGERY  2005   LEFT NASAL BRIDGE FOR BASAL CELL   TONSILLECTOMY  age 72   Patient Active Problem List   Diagnosis Date Noted   Hyponatremia 09/26/2023   Cellulitis of left buttock 09/26/2023   Proctitis 09/25/2023   History of microscopic colitis 09/25/2023   GERD (gastroesophageal reflux disease) 09/25/2023   Sepsis secondary to UTI (HCC) 06/24/2022   Extensor tenosynovitis of right wrist 06/23/2022   Thrombocytopenia (HCC) 06/23/2022   Prediabetes 06/23/2022   Intertrochanteric fracture of right femur, closed, with nonunion, subsequent encounter 01/22/2022   History of ulcerative colitis 07/10/2021   Complete right bundle branch block (RBBB) 05/15/2021   Body mass index (BMI) of 19.0-19.9 in adult 05/12/2021   Bilateral nephrolithiasis 09/21/2020   Senile purpura (HCC) 08/17/2020   Bilateral temporomandibular joint pain 06/15/2020   Spondylolisthesis at L4-L5 level 11/25/2018   Essential hypertension    Hyperlipidemia    Hypothyroidism    GERD    Asthma    DJD (degenerative joint disease)  Vitamin D deficiency    Osteoporosis with current pathological fracture    Allergy     PCP:   Lucky Cowboy, MD    REFERRING PROVIDER: Melina Fiddler, MD  REFERRING DIAG:  Diagnosis  M17.0 (ICD-10-CM) - Bilateral primary osteoarthritis of knee  R29.898 (ICD-10-CM) - Hip weakness  M54.50 (ICD-10-CM) - Low back pain    THERAPY DIAG:  Abnormal posture  Cervicalgia  Muscle weakness (generalized)  Cramp and spasm  Difficulty in walking, not elsewhere classified  Pain in right hip  Pain in left hip  Other low back pain  Chronic pain of both knees  Rationale for Evaluation and Treatment: Rehabilitation  ONSET DATE: 12/03/2023  SUBJECTIVE:   SUBJECTIVE  STATEMENT: Patient reports she is generally pain free when sitting still but her pain can reach the levels mentioned below.  She admits she has not been very compliant with her HEP.  "When I think about it, I will do a few"   PERTINENT HISTORY: Right hip fx from fall 2 years ago, initial ORIF, then subsequent THA.   PAIN:  02/18/24 Are you having pain? Yes: NPRS scale: 2-3/10 back, legs 5-6, neck 4/10 Pain location: knees, hips, low back  Pain description: aching Aggravating factors: prolonged sitting or standing Relieving factors: Tylenol arthritis  PRECAUTIONS: None  RED FLAGS: Bowel or bladder incontinence: Yes: age related    WEIGHT BEARING RESTRICTIONS: No  FALLS:  Has patient fallen in last 6 months? No  LIVING ENVIRONMENT: Lives with: lives with their family and lives with their spouse Lives in: House/apartment Stairs: Yes: Internal: 12 steps; can reach both and External: 4 steps; can reach both Has following equipment at home: None  OCCUPATION: retired  PLOF: Independent, Independent with basic ADLs, Independent with household mobility without device, Independent with community mobility without device, Independent with homemaking with ambulation, Independent with gait, Independent with transfers, Vocation/Vocational requirements: retired, and Leisure: read, watch TV, go out with friends and go to the beach  PATIENT GOALS: to be able to walk and do routine daily activities with manageable pain/ to be able to turn my head and sleep without pain in my neck.   NEXT MD VISIT: na  OBJECTIVE:  Note: Objective measures were completed at Evaluation unless otherwise noted.  DIAGNOSTIC FINDINGS: na  PATIENT SURVEYS:  Eval:  LEFS 41/80  02/18/24: LEFS 56/80= 70%  COGNITION: Overall cognitive status: Within functional limits for tasks assessed     SENSATION: WFL   MUSCLE LENGTH: Initial eval: Hamstrings: Right 40 deg; Left 45 deg Thomas test: Right pos; Left  pos  02/18/24: Hamstrings: Right 50; Left 50  POSTURE: rounded shoulders and forward head  PALPATION: Palpable crepitus bilateral knees on seated flexion / extension  CERVICAL ROM: Eval: Flexion: 40 deg Extension: 40 deg SB right : 25 deg SB left : 22 deg Rot right: 40 deg Rot left: 45 deg  02/18/24: Flexion: 50 deg Extension: 40 deg SB right : 30 deg SB left : 28 deg Rot right: 50 deg Rot left: 55 deg  UPPER EXTREMITY ROM:     WFL  UPPER EXTREMITY MMT:  02/18/24: Generally 4+/5 to 5/5 bilateral upper extremities  LOWER EXTREMITY ROM:  WFL  LOWER EXTREMITY MMT:   Initial eval: Bilateral hips generally 4-/5, bilateral knees generally 4/5, ankles 5/5  02/18/24:  Bilateral hips generally 4+/5, bilateral knees generally 4+/5, ankles 5/5  LOWER EXTREMITY SPECIAL TESTS:  Hip special tests: Luisa Hart (FABER) test: positive  and Thomas test: positive  Knee special tests: did not need this one.  FUNCTIONAL TESTS:  Eval: 5 times sit to stand: 16.49 sec Timed up and go (TUG): 12.18  1/202/2024: 3 minute walk test:  529 ft with RPE of 2-3/10   02/18/24: 5 times sit to stand: 9.98 sec Timed up and go (TUG): 9.36 sec 3 min walk test: 584.9 feet   GAIT: Distance walked: 30 feet Assistive device utilized: None Level of assistance: Complete Independence Comments: antalgic start up then short step length with limited heel strike                                                                                                                                TODAY'S TREATMENT DATE: 02/18/2024 Nustep x 5 min  Level 3 (PT present to discuss status) Re-assessment completed Goals addressed Reviewed HEP and how to disperse throughout the day  DATE: 02/11/2024 Nustep x 5 min  Level 3 (PT present to discuss status) Tband standing shoulder extension with yellow band 2 x 10 Tband standing shoulder rows with yellow band 2 x 10 Seated bilateral shoulder ER with yellow band 2 x  10 Seated bilateral shoulder horizontal abduction 2 x 10 with red tband Fwd bent shoulder extension with 1 lb 2 x 10 both Fwd bent shoulder row with 1 lb 2 x 10 both Fwd bent horizontal abduction 1 lb 2 x 10 both Side lying shoulder ER with 1 lb 2 x 10 both Supine serratus punch with 1 lb 2 x 10 MELT rotations x 2 min, nods x 2 min,   DATE: 02/04/2024 Nustep x 12 min (PT present to discuss status) Tband standing shoulder extension with yellow band 2 x 10 Tband standing shoulder rows with yellow band 2 x 10 Seated bilateral shoulder ER with yellow band 2 x 10 Seated bilateral shoulder horizontal abduction 2 x 10 with red tband Manual C spine ROM, upper trap and levator stretching, STM to upper traps and cervical paraspinals, sub occipital release   DATE: 01/21/2024 UBE x 6 min (3/3) level 1 (PT present to discuss status) 3 way scapular stabilization with blue loop x 5 each UE 4D ball rolls x 20 each direction on each arm (2 lb plyo ball) Wing taps on wall x 10 Thoracic extension seated with purple ball behind back x 10 holding 5 seconds Tband standing shoulder extension with red band 2 x 10 Tband standing shoulder rows with red band 2 x 10 Seated bilateral shoulder ER with red band 2 x 10 Seated bilateral shoulder horizontal abduction 2 x 10 with red tband Palloff press 2 x 10 each side with red tband Seated shoulder flexion  and scaption with 1 lb dumbbells 2 x 10 Trigger Point Dry Needling Subsequent Treatment: Instructions provided previously at initial dry needling treatment.  Patient Verbal Consent Given: Yes Education Handout Provided: Previously Provided Muscles Treated: bilateral upper traps Electrical Stimulation Performed: No Treatment Response/Outcome: Skilled palpation used  to identify taut bands and trigger points.  Once identified, dry needling techniques used to treat these areas.  Twitch response ellicited along with palpable elongation of muscle.  Following treatment,  patient reported similar soreness to last visit.     DATE: 12/05/23 Initial eval completed and initiated HEP    PATIENT EDUCATION:  Education details: Initiated HEP Person educated: Patient Education method: Programmer, multimedia, Facilities manager, Verbal cues, and Handouts Education comprehension: verbalized understanding, returned demonstration, and verbal cues required  HOME EXERCISE PROGRAM: Access Code: [email or web address] URL: [email or web address] Date: 12/09/2023 Prepared by: Clydie Braun Menke  Exercises - Standing Quad Stretch with Table and Chair Support  - 1 x daily - 7 x weekly - 1 sets - 3 reps - 30 sec hold - Seated Piriformis Stretch with Trunk Bend  - 1 x daily - 7 x weekly - 3 reps - 30 sec hold - Seated Piriformis Stretch  - 1 x daily - 7 x weekly - 2 reps - 20 sec hold - Seated Hamstring Stretch  - 1 x daily - 7 x weekly - 2 reps - 20 sec hold - Seated Long Arc Quad  - 1 x daily - 7 x weekly - 2 sets - 10 reps - Seated Pelvic Floor Contraction with Isometric Hip Adduction  - 1 x daily - 7 x weekly - 2 sets - 10 reps - Seated Hip Abduction with Resistance  - 1 x daily - 7 x weekly - 2 sets - 10 reps  ASSESSMENT:  CLINICAL IMPRESSION: Debbie Houston was able to tolerate addition of scapular stabilization exercises.  She continues to demonstrated fwd bent posture but is much more aware and corrects herself without cueing.  She would benefit from continuing skilled PT to address goals below.     OBJECTIVE IMPAIRMENTS: Abnormal gait, decreased balance, decreased coordination, decreased mobility, difficulty walking, decreased ROM, decreased strength, increased fascial restrictions, increased muscle spasms, impaired flexibility, postural dysfunction, and pain.   ACTIVITY LIMITATIONS: carrying, lifting, bending, sitting, standing, squatting, sleeping, stairs, transfers, bed mobility, continence, bathing, toileting, dressing, hygiene/grooming, and caring for others  PARTICIPATION LIMITATIONS:  meal prep, cleaning, laundry, driving, shopping, community activity, yard work, and church  PERSONAL FACTORS: Age, Fitness, Time since onset of injury/illness/exacerbation, and 3+ comorbidities: previous back surgery, prior hip surgery x 2, Htn, Osteopenia  are also affecting patient's functional outcome.   REHAB POTENTIAL: Fair multiple former orthopedic issues and age  CLINICAL DECISION MAKING: Evolving/moderate complexity  EVALUATION COMPLEXITY: Moderate   GOALS: Goals reviewed with patient? Yes  SHORT TERM GOALS: Target date: 01/02/2024 ( C spine goal date 02/05/2024)   Pain report to be no greater than 4/10  Baseline: Goal status: MET for knees ( will continue to work on Cspine pain level)  2.  Patient will be independent with initial HEP for knees and Cspine Baseline:  Goal status: MET 01/21/24   LONG TERM GOALS: Target date: 01/30/2024 (C spine long term goal date 02/19/2024)   Patient to report pain no greater than 2/10 Baseline:  Goal status:  (knee goal met, will leave in place for c spine)  2.  Patient to be independent with advanced HEP  Baseline:  Goal status: (knee goal met, will leave in place for c spine)  3.  Patient to be able to bend, stoop and squat with pain no greater than 2/10  Baseline:  Goal status: In progress (knees)  4.  Patient to be able to ascend and descend steps without pain or no greater than 2/10  Baseline:  Goal status: In progress (knees)  5.  Patient to be able to stand or walk for at least 15 min without leg pain  Baseline:  Goal status: In progress (knees)  6.  LEFS score to improve by 2-4 points Baseline:  Goal status: In progress  7. Patient to be able to sleep through the night   Baseline:  Goal status: NEW  8. Patient to demonstrate full Cspine ROM without pain  Baseline:  Goal status: NEW   PLAN:  PT FREQUENCY: 1-2x/week  PT DURATION: 8 weeks  PLANNED INTERVENTIONS: 97110-Therapeutic exercises, 97530-  Therapeutic activity, [id number]- Neuromuscular re-education, 97535- Self Care, 16109- Manual therapy, [id number]- Gait training, [phone]- Aquatic Therapy, 97014- Electrical stimulation (unattended), [id number]- Electrical stimulation (manual), 97016- Vasopneumatic device, [id number]- Ultrasound, [id number]- Ionotophoresis 4mg /ml Dexamethasone, Patient/Family education, Balance training, Stair training, Taping, Dry Needling, Joint mobilization, Spinal mobilization, Compression bandaging, Vestibular training, Visual/preceptual remediation/compensation, DME instructions, Cryotherapy, and Moist heat  PLAN FOR NEXT SESSION: UBE, postural strengthening, avoid over extension C spine.      Victorino Dike B. Sanika Brosious, PT 02/18/24 8:55 PM  [facility] [address] [place], [place] [address] Phone # [phone] Fax [phone]

## 2024-02-18 NOTE — Addendum Note (Signed)
 Addended by: Mikey Kirschner B on: 02/18/2024 09:04 PM   Modules accepted: Orders

## 2024-02-21 DIAGNOSIS — K5100 Ulcerative (chronic) pancolitis without complications: Principal | ICD-10-CM

## 2024-02-21 MED ORDER — VELSIPITY 2 MG TABLET
ORAL_TABLET | Freq: Every day | ORAL | 0 refills | 30 days | Status: CP
Start: 2024-02-21 — End: ?
  Filled 2024-04-06: qty 30, 30d supply, fill #0

## 2024-02-21 NOTE — Unmapped (Signed)
 Patient is requesting the following refill  Requested Prescriptions     Pending Prescriptions Disp Refills    etrasimod (VELSIPITY) 2 mg Tab 30 tablet 2     Sig: Take 1 tablet (2 mg) by mouth in the morning.       Recent Visits  Date Type Provider Dept   12/10/23 Office Visit Long, Ozella Almond, MD [facility]   10/21/23 Office Visit Stem, Jossie Ng, MD [facility]   10/07/23 Office Visit Stem, Jossie Ng, MD [facility] [place]   09/30/23 Office Visit Long, Ozella Almond, MD [facility] [place]   07/29/23 Telemedicine Long, Ozella Almond, MD [facility] [place]   Showing recent visits within past 365 days and meeting all other requirements  Future Appointments  Date Type Provider Dept   04/07/24 Appointment Long, Ozella Almond, MD [facility]   Showing future appointments within next 365 days and meeting all other requirements        Lab Results   Component Value Date    WBC 5.8 12/10/2023    RBC 3.94 (L) 12/10/2023    HGB 12.7 12/10/2023    HCT 37.2 12/10/2023    PLT 192 12/10/2023    ALT 15 12/10/2023    AST 28 12/10/2023    ALKPHOS 48 12/10/2023    CRP <4.0 12/10/2023    CREATININE 0.72 12/10/2023       On hold- labs due   Message sent to patient

## 2024-02-21 NOTE — Unmapped (Signed)
 1 time refill sent to avoid lapse in therapy while patient obtains updated labwork.

## 2024-02-25 NOTE — Unmapped (Signed)
 I spoke with Traci Bennett in regards to the next shipment of Velsipity and she declined due to having about a month's supply. The patient had to stop taking it for awhile and she still has a bottle left. [name] has asked that we give her a call back in about 2 weeks, I will adjust the refill call.

## 2024-02-26 ENCOUNTER — Ambulatory Visit

## 2024-02-26 DIAGNOSIS — M5459 Other low back pain: Secondary | ICD-10-CM

## 2024-02-26 DIAGNOSIS — M25552 Pain in left hip: Secondary | ICD-10-CM

## 2024-02-26 DIAGNOSIS — R262 Difficulty in walking, not elsewhere classified: Secondary | ICD-10-CM

## 2024-02-26 DIAGNOSIS — M25551 Pain in right hip: Secondary | ICD-10-CM

## 2024-02-26 DIAGNOSIS — R293 Abnormal posture: Secondary | ICD-10-CM | POA: Diagnosis not present

## 2024-02-26 DIAGNOSIS — R252 Cramp and spasm: Secondary | ICD-10-CM

## 2024-02-26 DIAGNOSIS — M6281 Muscle weakness (generalized): Secondary | ICD-10-CM

## 2024-02-26 DIAGNOSIS — M542 Cervicalgia: Secondary | ICD-10-CM

## 2024-02-26 NOTE — Therapy (Signed)
 OUTPATIENT PHYSICAL THERAPY TREATMENT NOTE   Patient Name: Debbie Houston MRN: [id number] DOB:Nov 19, 1941, 83 y.o., female Today's Date: 02/26/2024  END OF SESSION:  PT End of Session - 02/26/24 0930     Visit Number 11    Date for PT Re-Evaluation 04/14/24    Authorization Type Medicare    Progress Note Due on Visit 20    PT Start Time 0930    PT Stop Time 1017    PT Time Calculation (min) 47 min    Activity Tolerance Patient tolerated treatment well    Behavior During Therapy WFL for tasks assessed/performed              Past Medical History:  Diagnosis Date   Arthritis    Benign labile hypertension    Cancer (HCC)    skin cancer on face   Cellulitis    Colitis    DJD (degenerative joint disease)    Endometrial polyp    GERD (gastroesophageal reflux disease)    History of basal cell carcinoma excision    History of esophagitis    HOH (hard of hearing)    right ear better than left  -- wears no aides   Hyperlipidemia    Hypothyroidism    Neuromuscular disorder (HCC)    neuropathy in feet   Osteopenia    Prediabetes    S/P right hip fracture 08/17/2020   Sepsis (HCC) 2023   per patient   Vitamin D deficiency    Wears glasses    Past Surgical History:  Procedure Laterality Date   BACK SURGERY  2020   BIOPSY  09/27/2023   Procedure: BIOPSY;  Surgeon: Vida Rigger, MD;  Location: [facility] ENDOSCOPY;  Service: Gastroenterology;;   CATARACT EXTRACTION, BILATERAL Bilateral 2020   CONVERSION TO TOTAL HIP Right 01/22/2022   Procedure: CONVERSION TO TOTAL HIP;  Surgeon: Joen Laura, MD;  Location: [facility] ORS;  Service: Orthopedics;  Laterality: Right;   DILATATION & CURETTAGE/HYSTEROSCOPY WITH MYOSURE N/A 10/27/2014   Procedure: DILATATION & CURETTAGE/HYSTEROSCOPY WITH MYOSURE;  Surgeon: Juluis Mire, MD;  Location: [facility];  Service: Gynecology;  Laterality: N/A;   DILATION AND CURETTAGE OF UTERUS     FLEXIBLE SIGMOIDOSCOPY N/A 09/27/2023    Procedure: FLEXIBLE SIGMOIDOSCOPY;  Surgeon: Vida Rigger, MD;  Location: [facility] ENDOSCOPY;  Service: Gastroenterology;  Laterality: N/A;   HIP SURGERY Right 04/2020   rod and screw in right hip    MOHS SURGERY  2005   LEFT NASAL BRIDGE FOR BASAL CELL   TONSILLECTOMY  age 79   Patient Active Problem List   Diagnosis Date Noted   Hyponatremia 09/26/2023   Cellulitis of left buttock 09/26/2023   Proctitis 09/25/2023   History of microscopic colitis 09/25/2023   GERD (gastroesophageal reflux disease) 09/25/2023   Sepsis secondary to UTI (HCC) 06/24/2022   Extensor tenosynovitis of right wrist 06/23/2022   Thrombocytopenia (HCC) 06/23/2022   Prediabetes 06/23/2022   Intertrochanteric fracture of right femur, closed, with nonunion, subsequent encounter 01/22/2022   History of ulcerative colitis 07/10/2021   Complete right bundle branch block (RBBB) 05/15/2021   Body mass index (BMI) of 19.0-19.9 in adult 05/12/2021   Bilateral nephrolithiasis 09/21/2020   Senile purpura (HCC) 08/17/2020   Bilateral temporomandibular joint pain 06/15/2020   Spondylolisthesis at L4-L5 level 11/25/2018   Essential hypertension    Hyperlipidemia    Hypothyroidism    GERD    Asthma    DJD (degenerative joint disease)  Vitamin D deficiency    Osteoporosis with current pathological fracture    Allergy     PCP:   Lucky Cowboy, MD    REFERRING PROVIDER: Melina Fiddler, MD  REFERRING DIAG:  Diagnosis  M17.0 (ICD-10-CM) - Bilateral primary osteoarthritis of knee  R29.898 (ICD-10-CM) - Hip weakness  M54.50 (ICD-10-CM) - Low back pain    THERAPY DIAG:  Pain in right hip  Pain in left hip  Other low back pain  Muscle weakness (generalized)  Cramp and spasm  Difficulty in walking, not elsewhere classified  Cervicalgia  Abnormal posture  Rationale for Evaluation and Treatment: Rehabilitation  ONSET DATE: 12/03/2023  SUBJECTIVE:   SUBJECTIVE STATEMENT: Patient reports she is  having more neck and leg pain today but thinks this may be due to working outside in her flower beds this past week when it was warm.  We discussed DN but she states that "it didn't seem to help for very long".  Explained to patient that DN is somewhat temporary but that the cumulative effect of encouraging the muscle to elongate and allow the joint space to have less contact, thus reducing the shearing and continued deterioration.    PERTINENT HISTORY: Right hip fx from fall 2 years ago, initial ORIF, then subsequent THA.   PAIN:  02/26/24 Are you having pain? Yes: NPRS scale: 2/10 back, legs 8-9, neck 8/10 Pain location: knees, hips, low back  Pain description: aching Aggravating factors: prolonged sitting or standing Relieving factors: Tylenol arthritis  PRECAUTIONS: None  RED FLAGS: Bowel or bladder incontinence: Yes: age related    WEIGHT BEARING RESTRICTIONS: No  FALLS:  Has patient fallen in last 6 months? No  LIVING ENVIRONMENT: Lives with: lives with their family and lives with their spouse Lives in: House/apartment Stairs: Yes: Internal: 12 steps; can reach both and External: 4 steps; can reach both Has following equipment at home: None  OCCUPATION: retired  PLOF: Independent, Independent with basic ADLs, Independent with household mobility without device, Independent with community mobility without device, Independent with homemaking with ambulation, Independent with gait, Independent with transfers, Vocation/Vocational requirements: retired, and Leisure: read, watch TV, go out with friends and go to the beach  PATIENT GOALS: to be able to walk and do routine daily activities with manageable pain/ to be able to turn my head and sleep without pain in my neck.   NEXT MD VISIT: na  OBJECTIVE:  Note: Objective measures were completed at Evaluation unless otherwise noted.  DIAGNOSTIC FINDINGS: na  PATIENT SURVEYS:  Eval:  LEFS 41/80  02/18/24: LEFS 56/80=  70%  COGNITION: Overall cognitive status: Within functional limits for tasks assessed     SENSATION: WFL   MUSCLE LENGTH: Initial eval: Hamstrings: Right 40 deg; Left 45 deg Thomas test: Right pos; Left pos  02/18/24: Hamstrings: Right 50; Left 50  POSTURE: rounded shoulders and forward head  PALPATION: Palpable crepitus bilateral knees on seated flexion / extension  CERVICAL ROM: Eval: Flexion: 40 deg Extension: 40 deg SB right : 25 deg SB left : 22 deg Rot right: 40 deg Rot left: 45 deg  02/18/24: Flexion: 50 deg Extension: 40 deg SB right : 30 deg SB left : 28 deg Rot right: 50 deg Rot left: 55 deg  UPPER EXTREMITY ROM:     WFL  UPPER EXTREMITY MMT:  02/18/24: Generally 4+/5 to 5/5 bilateral upper extremities  LOWER EXTREMITY ROM:  WFL  LOWER EXTREMITY MMT:   Initial eval: Bilateral hips generally 4-/5, bilateral  knees generally 4/5, ankles 5/5  02/18/24:  Bilateral hips generally 4+/5, bilateral knees generally 4+/5, ankles 5/5  LOWER EXTREMITY SPECIAL TESTS:  Hip special tests: Luisa Hart (FABER) test: positive  and Thomas test: positive  Knee special tests: did not need this one.  FUNCTIONAL TESTS:  Eval: 5 times sit to stand: 16.49 sec Timed up and go (TUG): 12.18  1/202/2024: 3 minute walk test:  529 ft with RPE of 2-3/10   02/18/24: 5 times sit to stand: 9.98 sec Timed up and go (TUG): 9.36 sec 3 min walk test: 584.9 feet   GAIT: Distance walked: 30 feet Assistive device utilized: None Level of assistance: Complete Independence Comments: antalgic start up then short step length with limited heel strike                                                                                                                                TODAY'S TREATMENT DATE: 02/26/2024 UBE x 6 min  Level 1 (3 fwd/3 back) (PT present to discuss status) Standing hamstring stretch 3 x 30 sec each LE Standing hip quad stretch 3 x 30 sec each LE Seated piriformis  stretch 3 x 30 sec each (right hip extremely tight) Tband standing shoulder extension with red band 2 x 10 Tband standing shoulder rows with red band 2 x 10 Seated bilateral shoulder ER with red band 2 x 10 Seated bilateral shoulder horizontal abduction 2 x 10 with red tband MELT rotations x 2 min, nods x 2 min, and figure 8's x 2 min.   DATE: 02/18/2024 Nustep x 5 min  Level 3 (PT present to discuss status) Re-assessment completed Goals addressed Reviewed HEP and how to disperse throughout the day  DATE: 02/11/2024 Nustep x 5 min  Level 3 (PT present to discuss status) Tband standing shoulder extension with yellow band 2 x 10 Tband standing shoulder rows with yellow band 2 x 10 Seated bilateral shoulder ER with yellow band 2 x 10 Seated bilateral shoulder horizontal abduction 2 x 10 with red tband Fwd bent shoulder extension with 1 lb 2 x 10 both Fwd bent shoulder row with 1 lb 2 x 10 both Fwd bent horizontal abduction 1 lb 2 x 10 both Side lying shoulder ER with 1 lb 2 x 10 both Supine serratus punch with 1 lb 2 x 10 MELT rotations x 2 min, nods x 2 min,   DATE: 02/04/2024 Nustep x 12 min (PT present to discuss status) Tband standing shoulder extension with yellow band 2 x 10 Tband standing shoulder rows with yellow band 2 x 10 Seated bilateral shoulder ER with yellow band 2 x 10 Seated bilateral shoulder horizontal abduction 2 x 10 with red tband Manual C spine ROM, upper trap and levator stretching, STM to upper traps and cervical paraspinals, sub occipital release   DATE: 01/21/2024 UBE x 6 min (3/3) level 1 (PT present to discuss status) 3 way scapular stabilization  with blue loop x 5 each UE 4D ball rolls x 20 each direction on each arm (2 lb plyo ball) Wing taps on wall x 10 Thoracic extension seated with purple ball behind back x 10 holding 5 seconds Tband standing shoulder extension with red band 2 x 10 Tband standing shoulder rows with red band 2 x 10 Seated bilateral  shoulder ER with red band 2 x 10 Seated bilateral shoulder horizontal abduction 2 x 10 with red tband Palloff press 2 x 10 each side with red tband Seated shoulder flexion  and scaption with 1 lb dumbbells 2 x 10 Trigger Point Dry Needling Subsequent Treatment: Instructions provided previously at initial dry needling treatment.  Patient Verbal Consent Given: Yes Education Handout Provided: Previously Provided Muscles Treated: bilateral upper traps Electrical Stimulation Performed: No Treatment Response/Outcome: Skilled palpation used to identify taut bands and trigger points.  Once identified, dry needling techniques used to treat these areas.  Twitch response ellicited along with palpable elongation of muscle.  Following treatment, patient reported similar soreness to last visit.     DATE: 12/05/23 Initial eval completed and initiated HEP    PATIENT EDUCATION:  Education details: Initiated HEP Person educated: Patient Education method: Programmer, multimedia, Facilities manager, Verbal cues, and Handouts Education comprehension: verbalized understanding, returned demonstration, and verbal cues required  HOME EXERCISE PROGRAM: Access Code: [email or web address] URL: [email or web address] Date: 12/09/2023 Prepared by: Clydie Braun Menke  Exercises - Standing Quad Stretch with Table and Chair Support  - 1 x daily - 7 x weekly - 1 sets - 3 reps - 30 sec hold - Seated Piriformis Stretch with Trunk Bend  - 1 x daily - 7 x weekly - 3 reps - 30 sec hold - Seated Piriformis Stretch  - 1 x daily - 7 x weekly - 2 reps - 20 sec hold - Seated Hamstring Stretch  - 1 x daily - 7 x weekly - 2 reps - 20 sec hold - Seated Long Arc Quad  - 1 x daily - 7 x weekly - 2 sets - 10 reps - Seated Pelvic Floor Contraction with Isometric Hip Adduction  - 1 x daily - 7 x weekly - 2 sets - 10 reps - Seated Hip Abduction with Resistance  - 1 x daily - 7 x weekly - 2 sets - 10 reps  ASSESSMENT:  CLINICAL IMPRESSION: Roxene was  experiencing more leg pain today.  This was likely due to working in her flower beds and doing a lot of bending, stooping and squatting.  She is extremely tight in her hips quads and hamstrings.  She admits she hasn't been doing her HEP and that she would likely be doing better if she was more compliant.   She would benefit from continuing skilled PT to address goals below.     OBJECTIVE IMPAIRMENTS: Abnormal gait, decreased balance, decreased coordination, decreased mobility, difficulty walking, decreased ROM, decreased strength, increased fascial restrictions, increased muscle spasms, impaired flexibility, postural dysfunction, and pain.   ACTIVITY LIMITATIONS: carrying, lifting, bending, sitting, standing, squatting, sleeping, stairs, transfers, bed mobility, continence, bathing, toileting, dressing, hygiene/grooming, and caring for others  PARTICIPATION LIMITATIONS: meal prep, cleaning, laundry, driving, shopping, community activity, yard work, and church  PERSONAL FACTORS: Age, Fitness, Time since onset of injury/illness/exacerbation, and 3+ comorbidities: previous back surgery, prior hip surgery x 2, Htn, Osteopenia  are also affecting patient's functional outcome.   REHAB POTENTIAL: Fair multiple former orthopedic issues and age  CLINICAL DECISION MAKING: Evolving/moderate complexity  EVALUATION COMPLEXITY: Moderate  GOALS: Goals reviewed with patient? Yes  SHORT TERM GOALS: Target date: 01/02/2024 ( C spine goal date 02/05/2024)   Pain report to be no greater than 4/10  Baseline: Goal status: MET for knees ( will continue to work on Cspine pain level)  2.  Patient will be independent with initial HEP for knees and Cspine Baseline:  Goal status: MET 01/21/24   LONG TERM GOALS: Target date: 01/30/2024 (C spine long term goal date 02/19/2024)   Patient to report pain no greater than 2/10 Baseline:  Goal status:  (knee goal met, will leave in place for c spine)  2.  Patient to be  independent with advanced HEP  Baseline:  Goal status: (knee goal met, will leave in place for c spine)  3.  Patient to be able to bend, stoop and squat with pain no greater than 2/10  Baseline:  Goal status: In progress (knees)  4.  Patient to be able to ascend and descend steps without pain or no greater than 2/10  Baseline:  Goal status: In progress (knees)  5.  Patient to be able to stand or walk for at least 15 min without leg pain  Baseline:  Goal status: In progress (knees)  6.  LEFS score to improve by 2-4 points Baseline:  Goal status: In progress  7. Patient to be able to sleep through the night   Baseline:  Goal status: NEW  8. Patient to demonstrate full Cspine ROM without pain  Baseline:  Goal status: NEW   PLAN:  PT FREQUENCY: 1-2x/week  PT DURATION: 8 weeks  PLANNED INTERVENTIONS: 97110-Therapeutic exercises, 97530- Therapeutic activity, [id number]- Neuromuscular re-education, 97535- Self Care, 16109- Manual therapy, [id number]- Gait training, [phone]- Aquatic Therapy, 97014- Electrical stimulation (unattended), [id number]- Electrical stimulation (manual), 97016- Vasopneumatic device, [id number]- Ultrasound, [id number]- Ionotophoresis 4mg /ml Dexamethasone, Patient/Family education, Balance training, Stair training, Taping, Dry Needling, Joint mobilization, Spinal mobilization, Compression bandaging, Vestibular training, Visual/preceptual remediation/compensation, DME instructions, Cryotherapy, and Moist heat  PLAN FOR NEXT SESSION: UBE, postural strengthening, avoid over extension C spine.      Victorino Dike B. Norelle Runnion, PT 02/26/24 11:05 AM [facility] [address] [place], [place] [address] Phone # [phone] Fax [phone]

## 2024-02-27 DIAGNOSIS — Z961 Presence of intraocular lens: Secondary | ICD-10-CM | POA: Diagnosis not present

## 2024-02-27 DIAGNOSIS — H04123 Dry eye syndrome of bilateral lacrimal glands: Secondary | ICD-10-CM | POA: Diagnosis not present

## 2024-02-27 DIAGNOSIS — H26492 Other secondary cataract, left eye: Secondary | ICD-10-CM | POA: Diagnosis not present

## 2024-02-27 DIAGNOSIS — H18413 Arcus senilis, bilateral: Secondary | ICD-10-CM | POA: Diagnosis not present

## 2024-03-02 ENCOUNTER — Other Ambulatory Visit: Payer: Self-pay | Admitting: *Deleted

## 2024-03-02 DIAGNOSIS — M810 Age-related osteoporosis without current pathological fracture: Secondary | ICD-10-CM

## 2024-03-02 DIAGNOSIS — R42 Dizziness and giddiness: Secondary | ICD-10-CM | POA: Diagnosis not present

## 2024-03-02 DIAGNOSIS — I10 Essential (primary) hypertension: Secondary | ICD-10-CM | POA: Diagnosis not present

## 2024-03-03 ENCOUNTER — Ambulatory Visit: Payer: BLUE CROSS/BLUE SHIELD

## 2024-03-03 DIAGNOSIS — H2512 Age-related nuclear cataract, left eye: Secondary | ICD-10-CM | POA: Diagnosis not present

## 2024-03-03 DIAGNOSIS — Z961 Presence of intraocular lens: Secondary | ICD-10-CM | POA: Diagnosis not present

## 2024-03-03 DIAGNOSIS — Z9842 Cataract extraction status, left eye: Secondary | ICD-10-CM | POA: Diagnosis not present

## 2024-03-03 LAB — VITAMIN D 25 HYDROXY (VIT D DEFICIENCY, FRACTURES): Vit D, 25-Hydroxy: 59.6 ng/mL (ref 30.0–100.0)

## 2024-03-06 DIAGNOSIS — R002 Palpitations: Secondary | ICD-10-CM | POA: Diagnosis not present

## 2024-03-10 ENCOUNTER — Ambulatory Visit (INDEPENDENT_AMBULATORY_CARE_PROVIDER_SITE_OTHER): Admitting: Sports Medicine

## 2024-03-10 DIAGNOSIS — M810 Age-related osteoporosis without current pathological fracture: Secondary | ICD-10-CM

## 2024-03-10 MED ORDER — DENOSUMAB 60 MG/ML ~~LOC~~ SOSY
60.0000 mg | PREFILLED_SYRINGE | Freq: Once | SUBCUTANEOUS | Status: AC
  Administered 2024-03-10: 60 mg via SUBCUTANEOUS

## 2024-03-10 NOTE — Progress Notes (Signed)
 Patient given [place] prolia injection 60mg /ml in the left arm. Patient tolerated injection well without reaction at the injection site. Patient will schedule next injection, which is 6 months from today.

## 2024-03-10 NOTE — Unmapped (Signed)
 Patient called nurse triage line regarding Velsipity  and dizziness. Patient reports dizziness has returned since resuming Velsipity  (previously held due to high blood pressures which has since resolved after PCP optimizing antihypertensive therapy). She reports dizziness is not quite positional and can occur while doing nothing. She feels unsteady on her feet like she may fall over. She reports no other changes in medications and her diarrhea is well controlled on Velsipity .    Due to improvement in diarrhea (hx of microscopic colitis), recommend to decrease Velsipity  to 2 mg PO every other day to see if it alleviates dizziness while maintaining control of diarrhea.    Will check in with patient in 2 weeks after change.    Discussed with Dr. Arman Lamprey, PharmD, BCPS, CPP  Clinical Pharmacist Practitioner, GI/IBD  [phone]

## 2024-03-11 ENCOUNTER — Ambulatory Visit: Payer: BLUE CROSS/BLUE SHIELD

## 2024-03-11 DIAGNOSIS — R293 Abnormal posture: Secondary | ICD-10-CM | POA: Diagnosis not present

## 2024-03-11 DIAGNOSIS — R252 Cramp and spasm: Secondary | ICD-10-CM

## 2024-03-11 DIAGNOSIS — M6281 Muscle weakness (generalized): Secondary | ICD-10-CM

## 2024-03-11 DIAGNOSIS — M5459 Other low back pain: Secondary | ICD-10-CM

## 2024-03-11 DIAGNOSIS — G8929 Other chronic pain: Secondary | ICD-10-CM

## 2024-03-11 DIAGNOSIS — M542 Cervicalgia: Secondary | ICD-10-CM

## 2024-03-11 DIAGNOSIS — R262 Difficulty in walking, not elsewhere classified: Secondary | ICD-10-CM

## 2024-03-11 DIAGNOSIS — M25551 Pain in right hip: Secondary | ICD-10-CM

## 2024-03-11 DIAGNOSIS — M25552 Pain in left hip: Secondary | ICD-10-CM

## 2024-03-11 NOTE — Therapy (Signed)
 OUTPATIENT PHYSICAL THERAPY TREATMENT NOTE   Patient Name: Debbie Houston MRN: [id number] DOB:1941-06-03, 83 y.o., female Today's Date: 03/11/2024  END OF SESSION:  PT End of Session - 03/11/24 1405     Visit Number 12    Date for PT Re-Evaluation 04/14/24    Authorization Type Medicare    Progress Note Due on Visit 20    PT Start Time 1405    PT Stop Time 1445    PT Time Calculation (min) 40 min    Activity Tolerance Patient tolerated treatment well    Behavior During Therapy WFL for tasks assessed/performed              Past Medical History:  Diagnosis Date   Arthritis    Benign labile hypertension    Cancer (HCC)    skin cancer on face   Cellulitis    Colitis    DJD (degenerative joint disease)    Endometrial polyp    GERD (gastroesophageal reflux disease)    History of basal cell carcinoma excision    History of esophagitis    HOH (hard of hearing)    right ear better than left  -- wears no aides   Hyperlipidemia    Hypothyroidism    Neuromuscular disorder (HCC)    neuropathy in feet   Osteopenia    Prediabetes    S/P right hip fracture 08/17/2020   Sepsis (HCC) 2023   per patient   Vitamin D  deficiency    Wears glasses    Past Surgical History:  Procedure Laterality Date   BACK SURGERY  2020   BIOPSY  09/27/2023   Procedure: BIOPSY;  Surgeon: Ozell Blunt, MD;  Location: [facility] ENDOSCOPY;  Service: Gastroenterology;;   CATARACT EXTRACTION, BILATERAL Bilateral 2020   CONVERSION TO TOTAL HIP Right 01/22/2022   Procedure: CONVERSION TO TOTAL HIP;  Surgeon: Murleen Arms, MD;  Location: [facility] ORS;  Service: Orthopedics;  Laterality: Right;   DILATATION & CURETTAGE/HYSTEROSCOPY WITH MYOSURE N/A 10/27/2014   Procedure: DILATATION & CURETTAGE/HYSTEROSCOPY WITH MYOSURE;  Surgeon: Chandler Combs, MD;  Location: [facility];  Service: Gynecology;  Laterality: N/A;   DILATION AND CURETTAGE OF UTERUS     FLEXIBLE SIGMOIDOSCOPY N/A 09/27/2023    Procedure: FLEXIBLE SIGMOIDOSCOPY;  Surgeon: Ozell Blunt, MD;  Location: [facility] ENDOSCOPY;  Service: Gastroenterology;  Laterality: N/A;   HIP SURGERY Right 04/2020   rod and screw in right hip    MOHS SURGERY  2005   LEFT NASAL BRIDGE FOR BASAL CELL   TONSILLECTOMY  age 76   Patient Active Problem List   Diagnosis Date Noted   Hyponatremia 09/26/2023   Cellulitis of left buttock 09/26/2023   Proctitis 09/25/2023   History of microscopic colitis 09/25/2023   GERD (gastroesophageal reflux disease) 09/25/2023   Sepsis secondary to UTI (HCC) 06/24/2022   Extensor tenosynovitis of right wrist 06/23/2022   Thrombocytopenia (HCC) 06/23/2022   Prediabetes 06/23/2022   Intertrochanteric fracture of right femur, closed, with nonunion, subsequent encounter 01/22/2022   History of ulcerative colitis 07/10/2021   Complete right bundle branch block (RBBB) 05/15/2021   Body mass index (BMI) of 19.0-19.9 in adult 05/12/2021   Bilateral nephrolithiasis 09/21/2020   Senile purpura (HCC) 08/17/2020   Bilateral temporomandibular joint pain 06/15/2020   Spondylolisthesis at L4-L5 level 11/25/2018   Essential hypertension    Hyperlipidemia    Hypothyroidism    GERD    Asthma    DJD (degenerative joint disease)  Vitamin D  deficiency    Osteoporosis with current pathological fracture    Allergy     PCP:   Vangie Genet, MD    REFERRING PROVIDER: Shermon Divine, MD  REFERRING DIAG:  Diagnosis  M17.0 (ICD-10-CM) - Bilateral primary osteoarthritis of knee  R29.898 (ICD-10-CM) - Hip weakness  M54.50 (ICD-10-CM) - Low back pain    THERAPY DIAG:  Pain in right hip  Pain in left hip  Other low back pain  Muscle weakness (generalized)  Chronic pain of both knees  Abnormal posture  Cervicalgia  Difficulty in walking, not elsewhere classified  Cramp and spasm  Rationale for Evaluation and Treatment: Rehabilitation  ONSET DATE: 12/03/2023  SUBJECTIVE:   SUBJECTIVE  STATEMENT: Patient reports she saw Dr. Perri Brake and she has been diagnosed with spinal stenosis.  She reports she has been having some sharp pain in her low back at times but this is only occasionally.   She understands she has a lot of arthritis.    PERTINENT HISTORY: Right hip fx from fall 2 years ago, initial ORIF, then subsequent THA.   PAIN:  02/26/24 Are you having pain? Yes: NPRS scale: 2/10 back, legs 8-9, neck 8/10 Pain location: knees, hips, low back  Pain description: aching Aggravating factors: prolonged sitting or standing Relieving factors: Tylenol  arthritis  PRECAUTIONS: None  RED FLAGS: Bowel or bladder incontinence: Yes: age related    WEIGHT BEARING RESTRICTIONS: No  FALLS:  Has patient fallen in last 6 months? No  LIVING ENVIRONMENT: Lives with: lives with their family and lives with their spouse Lives in: House/apartment Stairs: Yes: Internal: 12 steps; can reach both and External: 4 steps; can reach both Has following equipment at home: None  OCCUPATION: retired  PLOF: Independent, Independent with basic ADLs, Independent with household mobility without device, Independent with community mobility without device, Independent with homemaking with ambulation, Independent with gait, Independent with transfers, Vocation/Vocational requirements: retired, and Leisure: read, watch TV, go out with friends and go to the beach  PATIENT GOALS: to be able to walk and do routine daily activities with manageable pain/ to be able to turn my head and sleep without pain in my neck.   NEXT MD VISIT: na  OBJECTIVE:  Note: Objective measures were completed at Evaluation unless otherwise noted.  DIAGNOSTIC FINDINGS: na  PATIENT SURVEYS:  Eval:  LEFS 41/80  02/18/24: LEFS 56/80= 70%  COGNITION: Overall cognitive status: Within functional limits for tasks assessed     SENSATION: WFL   MUSCLE LENGTH: Initial eval: Hamstrings: Right 40 deg; Left 45 deg Thomas test:  Right pos; Left pos  02/18/24: Hamstrings: Right 50; Left 50  POSTURE: rounded shoulders and forward head  PALPATION: Palpable crepitus bilateral knees on seated flexion / extension  CERVICAL ROM: Eval: Flexion: 40 deg Extension: 40 deg SB right : 25 deg SB left : 22 deg Rot right: 40 deg Rot left: 45 deg  02/18/24: Flexion: 50 deg Extension: 40 deg SB right : 30 deg SB left : 28 deg Rot right: 50 deg Rot left: 55 deg  UPPER EXTREMITY ROM:     WFL  UPPER EXTREMITY MMT:  02/18/24: Generally 4+/5 to 5/5 bilateral upper extremities  LOWER EXTREMITY ROM:  WFL  LOWER EXTREMITY MMT:   Initial eval: Bilateral hips generally 4-/5, bilateral knees generally 4/5, ankles 5/5  02/18/24:  Bilateral hips generally 4+/5, bilateral knees generally 4+/5, ankles 5/5  LOWER EXTREMITY SPECIAL TESTS:  Hip special tests: Portia Brittle (FABER) test: positive  and Thomas test: positive  Knee special tests: did not need this one.  FUNCTIONAL TESTS:  Eval: 5 times sit to stand: 16.49 sec Timed up and go (TUG): 12.18  1/202/2024: 3 minute walk test:  529 ft with RPE of 2-3/10   02/18/24: 5 times sit to stand: 9.98 sec Timed up and go (TUG): 9.36 sec 3 min walk test: 584.9 feet   GAIT: Distance walked: 30 feet Assistive device utilized: None Level of assistance: Complete Independence Comments: antalgic start up then short step length with limited heel strike                                                                                                                                TODAY'S TREATMENT DATE: 03/11/2024 Seated 3 direction ball roll outs with green physio ball x 10 each direction Education on spinal stenosis and anatomy of the spine.  Proper sitting surfaces and body mechanics.  Supine hamstring stretch 10 x 10 sec hold Supine DKTC x 10 holding 10 sec each Supine PPT x 20 PPT with marching x 20 PPT with alternating arms and legs x 20 Seated mini sit ups 2 x 10 with 5  lbs Reclined position shoulder to hip x 10 each side  DATE: 02/26/2024 UBE x 6 min  Level 1 (3 fwd/3 back) (PT present to discuss status) Standing hamstring stretch 3 x 30 sec each LE Standing hip quad stretch 3 x 30 sec each LE Seated piriformis stretch 3 x 30 sec each (right hip extremely tight) Tband standing shoulder extension with red band 2 x 10 Tband standing shoulder rows with red band 2 x 10 Seated bilateral shoulder ER with red band 2 x 10 Seated bilateral shoulder horizontal abduction 2 x 10 with red tband MELT rotations x 2 min, nods x 2 min, and figure 8's x 2 min.   DATE: 02/18/2024 Nustep x 5 min  Level 3 (PT present to discuss status) Re-assessment completed Goals addressed Reviewed HEP and how to disperse throughout the day DATE: 12/05/23 Initial eval completed and initiated HEP    PATIENT EDUCATION:  Education details: Initiated HEP Person educated: Patient Education method: Programmer, multimedia, Facilities manager, Verbal cues, and Handouts Education comprehension: verbalized understanding, returned demonstration, and verbal cues required  HOME EXERCISE PROGRAM: Access Code: [email or web address] URL: [email or web address] Date: 12/09/2023 Prepared by: Chaneta Comer Menke  Exercises - Standing Quad Stretch with Table and Chair Support  - 1 x daily - 7 x weekly - 1 sets - 3 reps - 30 sec hold - Seated Piriformis Stretch with Trunk Bend  - 1 x daily - 7 x weekly - 3 reps - 30 sec hold - Seated Piriformis Stretch  - 1 x daily - 7 x weekly - 2 reps - 20 sec hold - Seated Hamstring Stretch  - 1 x daily - 7 x weekly - 2 reps - 20 sec hold - Seated Long Arc Quad  - 1 x  daily - 7 x weekly - 2 sets - 10 reps - Seated Pelvic Floor Contraction with Isometric Hip Adduction  - 1 x daily - 7 x weekly - 2 sets - 10 reps - Seated Hip Abduction with Resistance  - 1 x daily - 7 x weekly - 2 sets - 10 reps  ASSESSMENT:  CLINICAL IMPRESSION: Rolonda is doing well today.  She was able to do more  core work today.  She responds well to flexion activity.  She continues to have neck pain as well.  She was encouraged to try and practice keeping her "shoulders away from her ears".  This tends to be a default position for her.   She would benefit from continuing skilled PT to address goals below.     OBJECTIVE IMPAIRMENTS: Abnormal gait, decreased balance, decreased coordination, decreased mobility, difficulty walking, decreased ROM, decreased strength, increased fascial restrictions, increased muscle spasms, impaired flexibility, postural dysfunction, and pain.   ACTIVITY LIMITATIONS: carrying, lifting, bending, sitting, standing, squatting, sleeping, stairs, transfers, bed mobility, continence, bathing, toileting, dressing, hygiene/grooming, and caring for others  PARTICIPATION LIMITATIONS: meal prep, cleaning, laundry, driving, shopping, community activity, yard work, and church  PERSONAL FACTORS: Age, Fitness, Time since onset of injury/illness/exacerbation, and 3+ comorbidities: previous back surgery, prior hip surgery x 2, Htn, Osteopenia  are also affecting patient's functional outcome.   REHAB POTENTIAL: Fair multiple former orthopedic issues and age  CLINICAL DECISION MAKING: Evolving/moderate complexity  EVALUATION COMPLEXITY: Moderate   GOALS: Goals reviewed with patient? Yes  SHORT TERM GOALS: Target date: 01/02/2024 ( C spine goal date 02/05/2024)   Pain report to be no greater than 4/10  Baseline: Goal status: MET for knees ( will continue to work on Cspine pain level)  2.  Patient will be independent with initial HEP for knees and Cspine Baseline:  Goal status: MET 01/21/24   LONG TERM GOALS: Target date: 01/30/2024 (C spine long term goal date 02/19/2024)   Patient to report pain no greater than 2/10 Baseline:  Goal status:  (knee goal met, will leave in place for c spine)  2.  Patient to be independent with advanced HEP  Baseline:  Goal status: (knee goal met,  will leave in place for c spine)  3.  Patient to be able to bend, stoop and squat with pain no greater than 2/10  Baseline:  Goal status: In progress (knees)  4.  Patient to be able to ascend and descend steps without pain or no greater than 2/10  Baseline:  Goal status: In progress (knees)  5.  Patient to be able to stand or walk for at least 15 min without leg pain  Baseline:  Goal status: In progress (knees)  6.  LEFS score to improve by 2-4 points Baseline:  Goal status: In progress  7. Patient to be able to sleep through the night   Baseline:  Goal status: NEW  8. Patient to demonstrate full Cspine ROM without pain  Baseline:  Goal status: NEW   PLAN:  PT FREQUENCY: 1-2x/week  PT DURATION: 8 weeks  PLANNED INTERVENTIONS: 97110-Therapeutic exercises, 97530- Therapeutic activity, [id number]- Neuromuscular re-education, 97535- Self Care, [id number]- Manual therapy, [id number]- Gait training, [phone]- Aquatic Therapy, 97014- Electrical stimulation (unattended), [id number]- Electrical stimulation (manual), 97016- Vasopneumatic device, [id number]- Ultrasound, [id number]- Ionotophoresis 4mg /ml Dexamethasone , Patient/Family education, Balance training, Stair training, Taping, Dry Needling, Joint mobilization, Spinal mobilization, Compression bandaging, Vestibular training, Visual/preceptual remediation/compensation, DME instructions, Cryotherapy, and Moist heat  PLAN FOR NEXT SESSION: UBE or  Nustep, core and  postural strengthening, avoid over extension C spine.      Bridgette Campus B. Aubryana Vittorio, PT 03/11/24 2:48 PM [facility] [address], [address] [place], [place] [address] Phone # [phone] Fax [phone]

## 2024-03-12 DIAGNOSIS — K5100 Ulcerative (chronic) pancolitis without complications: Secondary | ICD-10-CM | POA: Diagnosis not present

## 2024-03-12 NOTE — Unmapped (Signed)
 [facility] has been contacted in regards to their refill of VELSIPITY  2 mg Tab (etrasimod). At this time, they have declined refill due to  has a month supply  . Refill assessment call date has been updated per the patient's request.

## 2024-03-13 LAB — HEPATIC FUNCTION PANEL
ALBUMIN: 4.4 g/dL (ref 3.7–4.7)
ALKALINE PHOSPHATASE: 57 IU/L (ref 44–121)
ALT (SGPT): 20 IU/L (ref 0–32)
AST (SGOT): 27 IU/L (ref 0–40)
BILIRUBIN DIRECT: 0.16 mg/dL (ref 0.00–0.40)
BILIRUBIN TOTAL (MG/DL) IN SER/PLAS: 0.4 mg/dL (ref 0.0–1.2)
TOTAL PROTEIN: 6 g/dL (ref 6.0–8.5)

## 2024-03-13 LAB — CBC W/ DIFFERENTIAL
BANDED NEUTROPHILS ABSOLUTE COUNT: 0.1 10*3/uL (ref 0.0–0.1)
BASOPHILS ABSOLUTE COUNT: 0.1 10*3/uL (ref 0.0–0.2)
BASOPHILS RELATIVE PERCENT: 1 %
EOSINOPHILS ABSOLUTE COUNT: 0.1 10*3/uL (ref 0.0–0.4)
EOSINOPHILS RELATIVE PERCENT: 2 %
HEMATOCRIT: 39.2 % (ref 34.0–46.6)
HEMOGLOBIN: 12.9 g/dL (ref 11.1–15.9)
IMMATURE GRANULOCYTES: 1 %
LYMPHOCYTES ABSOLUTE COUNT: 0.4 10*3/uL — ABNORMAL LOW (ref 0.7–3.1)
LYMPHOCYTES RELATIVE PERCENT: 7 %
MEAN CORPUSCULAR HEMOGLOBIN CONC: 32.9 g/dL (ref 31.5–35.7)
MEAN CORPUSCULAR HEMOGLOBIN: 32.5 pg (ref 26.6–33.0)
MEAN CORPUSCULAR VOLUME: 99 fL — ABNORMAL HIGH (ref 79–97)
MONOCYTES ABSOLUTE COUNT: 0.6 10*3/uL (ref 0.1–0.9)
MONOCYTES RELATIVE PERCENT: 10 %
NEUTROPHILS ABSOLUTE COUNT: 4.5 10*3/uL (ref 1.4–7.0)
NEUTROPHILS RELATIVE PERCENT: 79 %
PLATELET COUNT: 217 10*3/uL (ref 150–450)
RED BLOOD CELL COUNT: 3.97 x10E6/uL (ref 3.77–5.28)
RED CELL DISTRIBUTION WIDTH: 13.3 % (ref 11.7–15.4)
WHITE BLOOD CELL COUNT: 5.7 10*3/uL (ref 3.4–10.8)

## 2024-03-16 DIAGNOSIS — R197 Diarrhea, unspecified: Principal | ICD-10-CM

## 2024-03-16 DIAGNOSIS — B351 Tinea unguium: Secondary | ICD-10-CM | POA: Diagnosis not present

## 2024-03-16 DIAGNOSIS — I10 Essential (primary) hypertension: Secondary | ICD-10-CM | POA: Diagnosis not present

## 2024-03-16 DIAGNOSIS — R42 Dizziness and giddiness: Secondary | ICD-10-CM | POA: Diagnosis not present

## 2024-03-16 MED ORDER — LOPERAMIDE 2 MG CAPSULE
ORAL_CAPSULE | 1 refills | 0.00000 days
Start: 2024-03-16 — End: ?

## 2024-03-17 ENCOUNTER — Ambulatory Visit (INDEPENDENT_AMBULATORY_CARE_PROVIDER_SITE_OTHER): Admitting: Neurology

## 2024-03-17 ENCOUNTER — Ambulatory Visit: Payer: BLUE CROSS/BLUE SHIELD

## 2024-03-17 ENCOUNTER — Encounter: Payer: Self-pay | Admitting: Neurology

## 2024-03-17 VITALS — BP 142/74 | HR 97 | Ht 65.0 in | Wt 112.2 lb

## 2024-03-17 DIAGNOSIS — R252 Cramp and spasm: Secondary | ICD-10-CM

## 2024-03-17 DIAGNOSIS — M5459 Other low back pain: Secondary | ICD-10-CM

## 2024-03-17 DIAGNOSIS — G629 Polyneuropathy, unspecified: Secondary | ICD-10-CM | POA: Diagnosis not present

## 2024-03-17 DIAGNOSIS — R293 Abnormal posture: Secondary | ICD-10-CM

## 2024-03-17 DIAGNOSIS — R262 Difficulty in walking, not elsewhere classified: Secondary | ICD-10-CM

## 2024-03-17 DIAGNOSIS — G8929 Other chronic pain: Secondary | ICD-10-CM

## 2024-03-17 DIAGNOSIS — M25551 Pain in right hip: Secondary | ICD-10-CM

## 2024-03-17 DIAGNOSIS — M25552 Pain in left hip: Secondary | ICD-10-CM

## 2024-03-17 DIAGNOSIS — M542 Cervicalgia: Secondary | ICD-10-CM

## 2024-03-17 DIAGNOSIS — M6281 Muscle weakness (generalized): Secondary | ICD-10-CM

## 2024-03-17 MED ORDER — LOPERAMIDE 2 MG CAPSULE
ORAL_CAPSULE | ORAL | 5 refills | 0.00000 days | Status: CP
Start: 2024-03-17 — End: ?

## 2024-03-17 NOTE — Therapy (Unsigned)
 OUTPATIENT PHYSICAL THERAPY TREATMENT NOTE   Patient Name: Debbie Houston MRN: [id number] DOB:Jul 28, 1941, 83 y.o., female Today's Date: 03/18/2024  END OF SESSION:  PT End of Session - 03/17/24 1619     Visit Number 13    Date for PT Re-Evaluation 04/14/24    Authorization Type Medicare    Progress Note Due on Visit 20    PT Start Time 1615    PT Stop Time 1700    PT Time Calculation (min) 45 min    Activity Tolerance Patient tolerated treatment well    Behavior During Therapy WFL for tasks assessed/performed              Past Medical History:  Diagnosis Date   Arthritis    Benign labile hypertension    Cancer (HCC)    skin cancer on face   Cellulitis    Colitis    DJD (degenerative joint disease)    Endometrial polyp    GERD (gastroesophageal reflux disease)    History of basal cell carcinoma excision    History of esophagitis    HOH (hard of hearing)    right ear better than left  -- wears no aides   Hyperlipidemia    Hypothyroidism    Neuromuscular disorder (HCC)    neuropathy in feet   Osteopenia    Prediabetes    S/P right hip fracture 08/17/2020   Sepsis (HCC) 2023   per patient   Vitamin D  deficiency    Wears glasses    Past Surgical History:  Procedure Laterality Date   BACK SURGERY  2020   BIOPSY  09/27/2023   Procedure: BIOPSY;  Surgeon: Ozell Blunt, MD;  Location: [facility] ENDOSCOPY;  Service: Gastroenterology;;   CATARACT EXTRACTION, BILATERAL Bilateral 2020   CONVERSION TO TOTAL HIP Right 01/22/2022   Procedure: CONVERSION TO TOTAL HIP;  Surgeon: Murleen Arms, MD;  Location: [facility] ORS;  Service: Orthopedics;  Laterality: Right;   DILATATION & CURETTAGE/HYSTEROSCOPY WITH MYOSURE N/A 10/27/2014   Procedure: DILATATION & CURETTAGE/HYSTEROSCOPY WITH MYOSURE;  Surgeon: Chandler Combs, MD;  Location: [facility] ;  Service: Gynecology;  Laterality: N/A;   DILATION AND CURETTAGE OF UTERUS     FLEXIBLE SIGMOIDOSCOPY N/A 09/27/2023    Procedure: FLEXIBLE SIGMOIDOSCOPY;  Surgeon: Ozell Blunt, MD;  Location: [facility] ENDOSCOPY;  Service: Gastroenterology;  Laterality: N/A;   HIP SURGERY Right 04/2020   rod and screw in right hip    MOHS SURGERY  2005   LEFT NASAL BRIDGE FOR BASAL CELL   TONSILLECTOMY  age 77   Patient Active Problem List   Diagnosis Date Noted   Hyponatremia 09/26/2023   Cellulitis of left buttock 09/26/2023   Proctitis 09/25/2023   History of microscopic colitis 09/25/2023   GERD (gastroesophageal reflux disease) 09/25/2023   Sepsis secondary to UTI (HCC) 06/24/2022   Extensor tenosynovitis of right wrist 06/23/2022   Thrombocytopenia (HCC) 06/23/2022   Prediabetes 06/23/2022   Intertrochanteric fracture of right femur, closed, with nonunion, subsequent encounter 01/22/2022   History of ulcerative colitis 07/10/2021   Complete right bundle branch block (RBBB) 05/15/2021   Body mass index (BMI) of 19.0-19.9 in adult 05/12/2021   Bilateral nephrolithiasis 09/21/2020   Senile purpura (HCC) 08/17/2020   Bilateral temporomandibular joint pain 06/15/2020   Spondylolisthesis at L4-L5 level 11/25/2018   Essential hypertension    Hyperlipidemia    Hypothyroidism    GERD    Asthma    DJD (degenerative joint disease)  Vitamin D  deficiency    Osteoporosis with current pathological fracture    Allergy     PCP:   Vangie Genet, MD    REFERRING PROVIDER: Shermon Divine, MD  REFERRING DIAG:  Diagnosis  M17.0 (ICD-10-CM) - Bilateral primary osteoarthritis of knee  R29.898 (ICD-10-CM) - Hip weakness  M54.50 (ICD-10-CM) - Low back pain    THERAPY DIAG:  Pain in right hip  Pain in left hip  Other low back pain  Muscle weakness (generalized)  Difficulty in walking, not elsewhere classified  Cervicalgia  Abnormal posture  Cramp and spasm  Chronic pain of both knees  Rationale for Evaluation and Treatment: Rehabilitation  ONSET DATE: 12/03/2023  SUBJECTIVE:   SUBJECTIVE  STATEMENT: Patient reports she is doing "ok".  She states the neck still hurts and she does have some back and knee pain but she is practicing all that she is learning and this has helped keep the pain at a much lower level.      PERTINENT HISTORY: Right hip fx from fall 2 years ago, initial ORIF, then subsequent THA.   PAIN:  03/17/24 Are you having pain? Yes: NPRS scale: 2/10 back, legs  Pain location: knees, hips, low back  Pain description: aching Aggravating factors: prolonged sitting or standing Relieving factors: Tylenol  arthritis  PRECAUTIONS: None  RED FLAGS: Bowel or bladder incontinence: Yes: age related    WEIGHT BEARING RESTRICTIONS: No  FALLS:  Has patient fallen in last 6 months? No  LIVING ENVIRONMENT: Lives with: lives with their family and lives with their spouse Lives in: House/apartment Stairs: Yes: Internal: 12 steps; can reach both and External: 4 steps; can reach both Has following equipment at home: None  OCCUPATION: retired  PLOF: Independent, Independent with basic ADLs, Independent with household mobility without device, Independent with community mobility without device, Independent with homemaking with ambulation, Independent with gait, Independent with transfers, Vocation/Vocational requirements: retired, and Leisure: read, watch TV, go out with friends and go to the beach  PATIENT GOALS: to be able to walk and do routine daily activities with manageable pain/ to be able to turn my head and sleep without pain in my neck.   NEXT MD VISIT: na  OBJECTIVE:  Note: Objective measures were completed at Evaluation unless otherwise noted.  DIAGNOSTIC FINDINGS: na  PATIENT SURVEYS:  Eval:  LEFS 41/80  02/18/24: LEFS 56/80= 70%  COGNITION: Overall cognitive status: Within functional limits for tasks assessed     SENSATION: WFL   MUSCLE LENGTH: Initial eval: Hamstrings: Right 40 deg; Left 45 deg Thomas test: Right pos; Left  pos  02/18/24: Hamstrings: Right 50; Left 50  POSTURE: rounded shoulders and forward head  PALPATION: Palpable crepitus bilateral knees on seated flexion / extension  CERVICAL ROM: Eval: Flexion: 40 deg Extension: 40 deg SB right : 25 deg SB left : 22 deg Rot right: 40 deg Rot left: 45 deg  02/18/24: Flexion: 50 deg Extension: 40 deg SB right : 30 deg SB left : 28 deg Rot right: 50 deg Rot left: 55 deg  UPPER EXTREMITY ROM:     WFL  UPPER EXTREMITY MMT:  02/18/24: Generally 4+/5 to 5/5 bilateral upper extremities  LOWER EXTREMITY ROM:  WFL  LOWER EXTREMITY MMT:   Initial eval: Bilateral hips generally 4-/5, bilateral knees generally 4/5, ankles 5/5  02/18/24:  Bilateral hips generally 4+/5, bilateral knees generally 4+/5, ankles 5/5  LOWER EXTREMITY SPECIAL TESTS:  Hip special tests: Portia Brittle (FABER) test: positive  and Andy Bannister  test: positive  Knee special tests: did not need this one.  FUNCTIONAL TESTS:  Eval: 5 times sit to stand: 16.49 sec Timed up and go (TUG): 12.18  1/202/2024: 3 minute walk test:  529 ft with RPE of 2-3/10   02/18/24: 5 times sit to stand: 9.98 sec Timed up and go (TUG): 9.36 sec 3 min walk test: 584.9 feet   GAIT: Distance walked: 30 feet Assistive device utilized: None Level of assistance: Complete Independence Comments: antalgic start up then short step length with limited heel strike                                                                                                                                TODAY'S TREATMENT DATE: 03/17/2024 Nustep x 6 min level 3  Seated 3 direction ball roll outs with green physio ball x 10 each direction Seated hamstring stretch 10 x 10 sec hold Seated on blue physio ball marching x 20 Seated on blue ball alternating arm raises x 20 Seated on blue ball alternating arm and leg x 20 Seated on blue ball elbow to opposite knee x 10 on each side Seated piriformis stretch 3 x 30 sec each  side Seated mini sit ups 2 x 10 with 5 lbs Reclined position shoulder to hip x 10 each side  DATE: 03/11/2024 Seated 3 direction ball roll outs with green physio ball x 10 each direction Education on spinal stenosis and anatomy of the spine.  Proper sitting surfaces and body mechanics.  Supine hamstring stretch 10 x 10 sec hold Supine DKTC x 10 holding 10 sec each Supine PPT x 20 PPT with marching x 20 PPT with alternating arms and legs x 20 Seated mini sit ups 2 x 10 with 5 lbs Reclined position shoulder to hip x 10 each side  DATE: 02/26/2024 UBE x 6 min  Level 1 (3 fwd/3 back) (PT present to discuss status) Standing hamstring stretch 3 x 30 sec each LE Standing hip quad stretch 3 x 30 sec each LE Seated piriformis stretch 3 x 30 sec each (right hip extremely tight) Tband standing shoulder extension with red band 2 x 10 Tband standing shoulder rows with red band 2 x 10 Seated bilateral shoulder ER with red band 2 x 10 Seated bilateral shoulder horizontal abduction 2 x 10 with red tband MELT rotations x 2 min, nods x 2 min, and figure 8's x 2 min.   DATE: 02/18/2024 Nustep x 5 min  Level 3 (PT present to discuss status) Re-assessment completed Goals addressed Reviewed HEP and how to disperse throughout the day DATE: 12/05/23 Initial eval completed and initiated HEP    PATIENT EDUCATION:  Education details: Initiated HEP Person educated: Patient Education method: Programmer, multimedia, Facilities manager, Verbal cues, and Handouts Education comprehension: verbalized understanding, returned demonstration, and verbal cues required  HOME EXERCISE PROGRAM: Access Code: [email or web address] URL: [email or web address] Date: 12/09/2023 Prepared by: Robyne Christen  Exercises - Standing  Lobbyist with Table and Chair Support  - 1 x daily - 7 x weekly - 1 sets - 3 reps - 30 sec hold - Seated Piriformis Stretch with Trunk Bend  - 1 x daily - 7 x weekly - 3 reps - 30 sec hold - Seated Piriformis  Stretch  - 1 x daily - 7 x weekly - 2 reps - 20 sec hold - Seated Hamstring Stretch  - 1 x daily - 7 x weekly - 2 reps - 20 sec hold - Seated Long Arc Quad  - 1 x daily - 7 x weekly - 2 sets - 10 reps - Seated Pelvic Floor Contraction with Isometric Hip Adduction  - 1 x daily - 7 x weekly - 2 sets - 10 reps - Seated Hip Abduction with Resistance  - 1 x daily - 7 x weekly - 2 sets - 10 reps  ASSESSMENT:  CLINICAL IMPRESSION: Debbie Houston was able to do new physio ball core work with min loss of balance once she learned how to engage her core to keep her balance.  She continues to have some restriction in the right hip which likely affects her back pain.  She seems to be a little more compliant with her HEP.     She would benefit from continuing skilled PT to address goals below.     OBJECTIVE IMPAIRMENTS: Abnormal gait, decreased balance, decreased coordination, decreased mobility, difficulty walking, decreased ROM, decreased strength, increased fascial restrictions, increased muscle spasms, impaired flexibility, postural dysfunction, and pain.   ACTIVITY LIMITATIONS: carrying, lifting, bending, sitting, standing, squatting, sleeping, stairs, transfers, bed mobility, continence, bathing, toileting, dressing, hygiene/grooming, and caring for others  PARTICIPATION LIMITATIONS: meal prep, cleaning, laundry, driving, shopping, community activity, yard work, and church  PERSONAL FACTORS: Age, Fitness, Time since onset of injury/illness/exacerbation, and 3+ comorbidities: previous back surgery, prior hip surgery x 2, Htn, Osteopenia  are also affecting patient's functional outcome.   REHAB POTENTIAL: Fair multiple former orthopedic issues and age  CLINICAL DECISION MAKING: Evolving/moderate complexity  EVALUATION COMPLEXITY: Moderate   GOALS: Goals reviewed with patient? Yes  SHORT TERM GOALS: Target date: 01/02/2024 ( C spine goal date 02/05/2024)   Pain report to be no greater than 4/10   Baseline: Goal status: MET for knees ( will continue to work on Cspine pain level)  2.  Patient will be independent with initial HEP for knees and Cspine Baseline:  Goal status: MET 01/21/24   LONG TERM GOALS: Target date: 01/30/2024 (C spine long term goal date 02/19/2024)   Patient to report pain no greater than 2/10 Baseline:  Goal status:  (knee goal met, will leave in place for c spine)  2.  Patient to be independent with advanced HEP  Baseline:  Goal status: (knee goal met, will leave in place for c spine)  3.  Patient to be able to bend, stoop and squat with pain no greater than 2/10  Baseline:  Goal status: In progress (knees)  4.  Patient to be able to ascend and descend steps without pain or no greater than 2/10  Baseline:  Goal status: In progress (knees)  5.  Patient to be able to stand or walk for at least 15 min without leg pain  Baseline:  Goal status: In progress (knees)  6.  LEFS score to improve by 2-4 points Baseline:  Goal status: In progress  7. Patient to be able to sleep through the night   Baseline:  Goal status: NEW  8. Patient to demonstrate full Cspine ROM without pain  Baseline:  Goal status: NEW   PLAN:  PT FREQUENCY: 1-2x/week  PT DURATION: 8 weeks  PLANNED INTERVENTIONS: 97110-Therapeutic exercises, 97530- Therapeutic activity, [id number]- Neuromuscular re-education, 97535- Self Care, [id number]- Manual therapy, [id number]- Gait training, [phone]- Aquatic Therapy, 97014- Electrical stimulation (unattended), [id number]- Electrical stimulation (manual), 97016- Vasopneumatic device, [id number]- Ultrasound, [id number]- Ionotophoresis 4mg /ml Dexamethasone , Patient/Family education, Balance training, Stair training, Taping, Dry Needling, Joint mobilization, Spinal mobilization, Compression bandaging, Vestibular training, Visual/preceptual remediation/compensation, DME instructions, Cryotherapy, and Moist heat  PLAN FOR NEXT SESSION: UBE or Nustep, functional strengthening,  core and  postural strengthening, avoid over extension C spine.      Bridgette Campus B. Westen Dinino, PT 03/18/24 7:27 AM [facility] [address], [address] [place], [place] [address] Phone # [phone] Fax [phone]

## 2024-03-17 NOTE — Progress Notes (Signed)
 Follow-up Visit   Date: 03/17/2024    Debbie Houston MRN: [id number] DOB: October 21, 1941    Debbie Houston is a 83 y.o. left-handed Caucasian female with ulcerative colitis, hyperlipidemia, GERD, asthma, hypothyroidism, OA, cervical spondylosis, lumbar spondylolisthesis at L4-5 (followed by Dr. Ellery Guthrie)  returning to the clinic for follow-up of bilateral hand and feet pain/paresthesias.  The patient was accompanied to the clinic by self.  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 CSF testing for polyradiculoneuropathy returned normal.  Symptoms are predominately numbness, therefore no role for medications.  She has previously tried gabapentin  and cymbalta  without benefit. For  imbalance, she is doing PT.  Recommend that she start to use a cane.  Lumbar canal stenosis, she will be seeing Dr. Ellery Guthrie.    Return to clinic in 6 months   --------------------------------------------- History of present illness: She received Remicade  infusion for Crohn's colitis in July and following this developed UTI.  She was hospitalized in August for urosepsis.  While hospitalized, she began having numbness, tingling, and sharp shooting pain over the hands and feet.  Numbness is constant and she has episodic sharp pain.  She tried gabapentin  200mg  but stopped this due to dizziness.   She complains of weakness with difficulty with opening jars/grip.  She has some imbalance, walks unassisted and does not use a cane. Over the past three months, symptoms remain unchanged.  She denies having any numbness/tingling prior to her hospitalization.    She lives at home with husband and high-functioning autistic son.   UPDATE 10/31/2022:  She is here for follow-up and discuss results of EMG and labs.  There has been no change in the numbness of her hands. She continues to have difficulty with holding onto objects  and frequently drops them.  She also endorse imbalance and had one fall which results in bruise over her right eye.  Tingling has improved slightly on Cymbalta  30mg  and she would like to consider increasing the dose. No new complaints.    UPDATE 12/19/2022:  She is here for follow-up. CSF testing returned normal without signs of inflammation.  She continues have numbness of the hands and reports droping things frequently.   She denies significant tingling or burning of the hands.  She is tolerating Cymbalta  30mg  twice daily.  Over the past few weeks, her colitis has been giving her more problems and she is currently on a steroid taper.   UPDATE 10/22/2023:  She is here for follow-up visit.  She feels that the numbness in the hands are getting worse and fine motor tasks such as writing and putting her hearing aides is difficult.  She stopped Cymbalta  at the recommendation of GI due to issues with colitis. She denies significant pain.   She also complains of right sided dull headache and numbness, which is new.  Headaches are not worse with coughing, sneezing, or bending.   UPDATE 03/17/2024:  She is here for follow-up visit.  There has been no significant change in the numbness of the hands and feet.  She was found to lumbar canal stenosis by Dr. Perri Brake and will be seeing neurosurgery for further evaluation.  She has been doing PT for balance and does not noticed a marked benefit.   Medications:  Current Outpatient Medications on File Prior to Visit  Medication Sig Dispense Refill   acetaminophen  (TYLENOL ) 500 MG tablet Take 500 mg by mouth as needed for  moderate pain (pain score 4-6) or headache.     Ascorbic Acid  (VITAMIN C) 1000 MG tablet Take 1,000 mg by mouth daily.      aspirin  EC 81 MG tablet Take 1 tablet (81 mg total) by mouth in the morning and at bedtime. (Patient taking differently: Take 81 mg by mouth at bedtime.) 60 tablet 0   bisoprolol  (ZEBETA ) 5 MG tablet Take 1 tablet every Morning for  BP (Patient taking differently: Take 5 mg by mouth at bedtime.) 90 tablet 3   Calcium  Carbonate-Vit D-Min (CALCIUM  1200 PO) Take 1,200 mg by mouth daily.     cetirizine (ZYRTEC ALLERGY) 10 MG tablet Take 10 mg by mouth daily as needed for allergies.     CINNAMON PO Take 2,000 mg by mouth daily.     COLESTIPOL  HCL PO Take 2 tablets by mouth in the morning and at bedtime.     cycloSPORINE  (RESTASIS ) 0.05 % ophthalmic emulsion Place 2 drops into both eyes 2 (two) times daily.     Etrasimod Arginine  (VELSIPITY ) 2 MG TABS Take 2 mg by mouth daily. (Patient taking differently: Take 2 mg by mouth daily. On Hold)     Fluticasone Propionate (FLONASE NA) Place 1 spray into the nose as needed (allergies).     levothyroxine  (SYNTHROID ) 88 MCG tablet Take 1 tab daily except M-W-F take 1/2 tab 45 tablet 3   loperamide  (IMODIUM ) 2 MG capsule Take 2 mg by mouth 2 (two) times daily as needed for diarrhea or loose stools.     meclizine  (ANTIVERT ) 25 MG tablet 1/2-1 pill up to 3 times daily for motion sickness/dizziness (Patient taking differently: Take 25 mg by mouth as needed for dizziness or nausea.) 30 tablet 0   mesalamine  (LIALDA ) 1.2 g EC tablet Take 2.4 g by mouth in the morning and at bedtime.     Multiple Vitamin (THERA VITAMIN PO) Take 1 tablet by mouth daily.     Omega-3 Fatty Acids (FISH OIL) 1200 MG CAPS Take 1,200 mg by mouth daily.      pantoprazole  (PROTONIX ) 40 MG tablet Take 1 tablet  2 x /day  for Heartburn & Indigestion (Patient taking differently: Take 40 mg by mouth 2 (two) times daily.) 180 tablet 0   Probiotic Product (PROBIOTIC PO) Take 1 capsule by mouth daily.     PROLIA  60 MG/ML SOSY injection Inject 60 mg into the skin every 6 (six) months.     pseudoephedrine  (SUDAFED) 120 MG 12 hr tablet Take  1 tablet  2 x /day (every 12 hours)  for Head and Chest Congestion (Patient taking differently: Take 120 mg by mouth as needed for congestion.) 60 tablet 0   simvastatin  (ZOCOR ) 20 MG tablet Take  1 tablet at Bedtime for Cholesterol 90 tablet 3   triamcinolone  ointment (KENALOG ) 0.5 % Apply 1 Application topically 2 (two) times daily. 30 g 0   vancomycin  (VANCOCIN ) 125 MG capsule Take 125 mg by mouth in the morning and at bedtime.     Coenzyme Q10 (COQ10) 200 MG CAPS Take 200 mg by mouth daily. (Patient not taking: Reported on 03/17/2024)     No current facility-administered medications on file prior to visit.    Allergies:  Allergies  Allergen Reactions   Remicade  [Infliximab ] Other (See Comments)    Unknown    Fosamax [Alendronate Sodium] Other (See Comments)    GI upset    Montelukast Other (See Comments)    Reaction:  Makes pt jittery  Reaction:  Makes pt jittery  Reaction:  Makes pt jittery Makes pt jittery  Makes pt jittery   Singulair [Montelukast Sodium] Other (See Comments)    Makes pt jittery   Wellbutrin [Bupropion] Hives   Clindamycin /Lincomycin Rash   Sulfa Antibiotics Rash    Vital Signs:  BP (!) 142/74   Pulse 97   Ht 5\' 5"  (1.651 m)   Wt 112 lb 3.2 oz (50.9 kg)   SpO2 98%   BMI 18.67 kg/m   Neurological Exam: MENTAL STATUS including orientation to time, place, person, recent and remote memory, attention span and concentration, language, and fund of knowledge is normal.  Speech is not dysarthric.  CRANIAL NERVES:   Pupils equal round and reactive to light.  Normal conjugate, extra-ocular eye movements in all directions of gaze.  No ptosis .  Face is symmetric.   MOTOR:  Motor strength is 5/5 in all extremities, except 4/5 intrinsic hand muscles.  Generalized loss of muscle bulk with bilateral ABP atrophy, fasciculations or abnormal movements.  No pronator drift.  Tone is normal.    MSRs:  Reflexes are 2+/4 throughout, except 3+/4 at the knees (*new).  SENSORY:  Intact to vibration throughout, except trace at the ankles bilaterally.  Rhomberg testing is mildly positive.   COORDINATION/GAIT:   Gait wide based and slightly unstable,  unassisted  Data: Labs 10/22/2022:  CRP < 1.0, ESR 19, folate > 23, copper  108, SPEP with IFE poorly defined IgG and kappa  Lab Results  Component Value Date   VITAMINB12 681 07/10/2022    NCS/EMG of the arms 10/09/2022: This is a complex study.  Findings are most suggestive of a non-length dependent and chronic sensorimotor polyneuropathy, with axonal and demyelinating features. Alternatively, a polyneuropathy with superimposed median and ulnar entrapment neuropathies in the upper extremity is possible.  Nerve ultrasound recommended to further evaluate. Chronic L5 radiculopathy affecting the right lower extremity, mild.  CSF 11/23/2022:  R2 W0 G74 P46  OCB absent, MBP < 2.0, Lyme neg, ACE neg, cytology neg  MRI brain and cervical spine wo contrast 01/30/2024: 1. No acute intracranial abnormality. 2. Chronic small vessel ischemia and volume loss. 3. Mild right C4-5 and moderate left C5-6 neural foraminal stenosis. 4. No spinal canal stenosis.   Thank you for allowing me to participate in patient's care.  If I Houston answer any additional questions, I would be pleased to do so.    Sincerely,    Mitch Arquette K. Lydia Sams, DO

## 2024-03-17 NOTE — Unmapped (Signed)
 Patient is requesting the following refill  Requested Prescriptions     Pending Prescriptions Disp Refills    loperamide (IMODIUM) 2 mg capsule [Pharmacy Med Name: LOPERAMIDE 2MG  CAPSULES] 120 capsule 1     Sig: TAKE 1 CAPSULE BY MOUTH FOUR TIMES DAILY AS NEEDED FOR DIARRHEA       Recent Visits  Date Type Provider Dept   12/10/23 Office Visit Long, Fransisco Jack, MD [facility] [place]   10/21/23 Office Visit Stem, Artice Last, MD [facility] Multispecialty Surgery [facility] Surgery [facility]   10/07/23 Office Visit Stem, Artice Last, MD [facility]pec[facility]ty [facility] Surgery [facility]   09/30/23 Office Visit Long, Fransisco Jack, MD [facility] [place]   07/29/23 Telemedicine Long, Fransisco Jack, MD [facility]   Showing recent visits within past 365 days and meeting all other requirements  Future Appointments  Date Type Provider Dept   05/05/24 Appointment Long, Fransisco Jack, MD [facility]   Showing future appointments within next 365 days and meeting all other requirements           Encounter for refill request:    Colonoscopy:   Appointments which have been scheduled for you      May 05, 2024 1:30 PM  (Arrive by 1:15 PM)  RETURN IBD with Dois Freeze, MD  [facility] GI MEDICINE [place] [facility] REGION) [address]  [facility] 1 through 4  [place] [address]  [phone]             Lab Results   Component Value Date    WBC 5.7 03/12/2024    RBC 3.97 03/12/2024    HGB 12.9 03/12/2024    HCT 39.2 03/12/2024    PLT 217 03/12/2024    ALT 20 03/12/2024    AST 27 03/12/2024    ALKPHOS 57 03/12/2024    CRP <4.0 12/10/2023    CREATININE 0.72 12/10/2023       refills authorized x  

## 2024-03-18 DIAGNOSIS — R002 Palpitations: Secondary | ICD-10-CM | POA: Diagnosis not present

## 2024-03-23 ENCOUNTER — Ambulatory Visit: Attending: Sports Medicine

## 2024-03-23 DIAGNOSIS — R252 Cramp and spasm: Secondary | ICD-10-CM | POA: Diagnosis present

## 2024-03-23 DIAGNOSIS — R262 Difficulty in walking, not elsewhere classified: Secondary | ICD-10-CM | POA: Insufficient documentation

## 2024-03-23 DIAGNOSIS — G8929 Other chronic pain: Secondary | ICD-10-CM | POA: Diagnosis present

## 2024-03-23 DIAGNOSIS — R293 Abnormal posture: Secondary | ICD-10-CM | POA: Insufficient documentation

## 2024-03-23 DIAGNOSIS — M25552 Pain in left hip: Secondary | ICD-10-CM | POA: Diagnosis present

## 2024-03-23 DIAGNOSIS — M542 Cervicalgia: Secondary | ICD-10-CM | POA: Insufficient documentation

## 2024-03-23 DIAGNOSIS — M5459 Other low back pain: Secondary | ICD-10-CM | POA: Insufficient documentation

## 2024-03-23 DIAGNOSIS — M25551 Pain in right hip: Secondary | ICD-10-CM | POA: Diagnosis present

## 2024-03-23 DIAGNOSIS — M6281 Muscle weakness (generalized): Secondary | ICD-10-CM | POA: Diagnosis present

## 2024-03-23 DIAGNOSIS — M25561 Pain in right knee: Secondary | ICD-10-CM | POA: Diagnosis present

## 2024-03-23 DIAGNOSIS — M25562 Pain in left knee: Secondary | ICD-10-CM | POA: Diagnosis present

## 2024-03-23 NOTE — Therapy (Signed)
 OUTPATIENT PHYSICAL THERAPY TREATMENT NOTE   Patient Name: Debbie [place] MRN: [id number] DOB:11-02-1941, 83 y.o., female Today's Date: 03/23/2024  END OF SESSION:  PT End of Session - 03/23/24 1414     Visit Number 14    Date for PT Re-Evaluation 04/14/24    Authorization Type Medicare    Progress Note Due on Visit 20    PT Start Time 1400    PT Stop Time 1445    PT Time Calculation (min) 45 min    Activity Tolerance Patient tolerated treatment well    Behavior During Therapy WFL for tasks assessed/performed              Past Medical History:  Diagnosis Date   Arthritis    Benign labile hypertension    Cancer (HCC)    skin cancer on face   Cellulitis    Colitis    DJD (degenerative joint disease)    Endometrial polyp    GERD (gastroesophageal reflux disease)    History of basal cell carcinoma excision    History of esophagitis    HOH (hard of hearing)    right ear better than left  -- wears no aides   Hyperlipidemia    Hypothyroidism    Neuromuscular disorder (HCC)    neuropathy in feet   Osteopenia    Prediabetes    S/P right hip fracture 08/17/2020   Sepsis (HCC) 2023   per patient   Vitamin D  deficiency    Wears glasses    Past Surgical History:  Procedure Laterality Date   BACK SURGERY  2020   BIOPSY  09/27/2023   Procedure: BIOPSY;  Surgeon: Ozell Blunt, MD;  Location: [facility] ENDOSCOPY;  Service: Gastroenterology;;   CATARACT EXTRACTION, BILATERAL Bilateral 2020   CONVERSION TO TOTAL HIP Right 01/22/2022   Procedure: CONVERSION TO TOTAL HIP;  Surgeon: Murleen Arms, MD;  Location: [facility] ORS;  Service: Orthopedics;  Laterality: Right;   DILATATION & CURETTAGE/HYSTEROSCOPY WITH MYOSURE N/A 10/27/2014   Procedure: DILATATION & CURETTAGE/HYSTEROSCOPY WITH MYOSURE;  Surgeon: Chandler Combs, MD;  Location: [facility];  Service: Gynecology;  Laterality: N/A;   DILATION AND CURETTAGE OF UTERUS     FLEXIBLE SIGMOIDOSCOPY N/A 09/27/2023    Procedure: FLEXIBLE SIGMOIDOSCOPY;  Surgeon: Ozell Blunt, MD;  Location: [facility] ENDOSCOPY;  Service: Gastroenterology;  Laterality: N/A;   HIP SURGERY Right 04/2020   rod and screw in right hip    MOHS SURGERY  2005   LEFT NASAL BRIDGE FOR BASAL CELL   TONSILLECTOMY  age 72   Patient Active Problem List   Diagnosis Date Noted   Hyponatremia 09/26/2023   Cellulitis of left buttock 09/26/2023   Proctitis 09/25/2023   History of microscopic colitis 09/25/2023   GERD (gastroesophageal reflux disease) 09/25/2023   Sepsis secondary to UTI (HCC) 06/24/2022   Extensor tenosynovitis of right wrist 06/23/2022   Thrombocytopenia (HCC) 06/23/2022   Prediabetes 06/23/2022   Intertrochanteric fracture of right femur, closed, with nonunion, subsequent encounter 01/22/2022   History of ulcerative colitis 07/10/2021   Complete right bundle branch block (RBBB) 05/15/2021   Body mass index (BMI) of 19.0-19.9 in adult 05/12/2021   Bilateral nephrolithiasis 09/21/2020   Senile purpura (HCC) 08/17/2020   Bilateral temporomandibular joint pain 06/15/2020   Spondylolisthesis at L4-L5 level 11/25/2018   Essential hypertension    Hyperlipidemia    Hypothyroidism    GERD    Asthma    DJD (degenerative joint disease)  Vitamin D  deficiency    Osteoporosis with current pathological fracture    Allergy     PCP:   Vangie Genet, MD    REFERRING PROVIDER: Shermon Divine, MD  REFERRING DIAG:  Diagnosis  M17.0 (ICD-10-CM) - Bilateral primary osteoarthritis of knee  R29.898 (ICD-10-CM) - Hip weakness  M54.50 (ICD-10-CM) - Low back pain    THERAPY DIAG:  Chronic pain of both knees  Pain in right hip  Pain in left hip  Other low back pain  Muscle weakness (generalized)  Abnormal posture  Cramp and spasm  Difficulty in walking, not elsewhere classified  Cervicalgia  Rationale for Evaluation and Treatment: Rehabilitation  ONSET DATE: 12/03/2023  SUBJECTIVE:   SUBJECTIVE  STATEMENT: Patient reports she is doing "fine".  Just the usual aches and pains.  My neck seems to feel a little better.   PERTINENT HISTORY: Right hip fx from fall 2 years ago, initial ORIF, then subsequent THA.   PAIN:  03/17/24 Are you having pain? Yes: NPRS scale: 2/10 back, legs  Pain location: knees, hips, low back  Pain description: aching Aggravating factors: prolonged sitting or standing Relieving factors: Tylenol  arthritis  PRECAUTIONS: None  RED FLAGS: Bowel or bladder incontinence: Yes: age related    WEIGHT BEARING RESTRICTIONS: No  FALLS:  Has patient fallen in last 6 months? No  LIVING ENVIRONMENT: Lives with: lives with their family and lives with their spouse Lives in: House/apartment Stairs: Yes: Internal: 12 steps; can reach both and External: 4 steps; can reach both Has following equipment at home: None  OCCUPATION: retired  PLOF: Independent, Independent with basic ADLs, Independent with household mobility without device, Independent with community mobility without device, Independent with homemaking with ambulation, Independent with gait, Independent with transfers, Vocation/Vocational requirements: retired, and Leisure: read, watch TV, go out with friends and go to the beach  PATIENT GOALS: to be able to walk and do routine daily activities with manageable pain/ to be able to turn my head and sleep without pain in my neck.   NEXT MD VISIT: na  OBJECTIVE:  Note: Objective measures were completed at Evaluation unless otherwise noted.  DIAGNOSTIC FINDINGS: na  PATIENT SURVEYS:  Eval:  LEFS 41/80  02/18/24: LEFS 56/80= 70%  COGNITION: Overall cognitive status: Within functional limits for tasks assessed     SENSATION: WFL   MUSCLE LENGTH: Initial eval: Hamstrings: Right 40 deg; Left 45 deg Thomas test: Right pos; Left pos  02/18/24: Hamstrings: Right 50; Left 50  POSTURE: rounded shoulders and forward head  PALPATION: Palpable crepitus  bilateral knees on seated flexion / extension  CERVICAL ROM: Eval: Flexion: 40 deg Extension: 40 deg SB right : 25 deg SB left : 22 deg Rot right: 40 deg Rot left: 45 deg  02/18/24: Flexion: 50 deg Extension: 40 deg SB right : 30 deg SB left : 28 deg Rot right: 50 deg Rot left: 55 deg  UPPER EXTREMITY ROM:     WFL  UPPER EXTREMITY MMT:  02/18/24: Generally 4+/5 to 5/5 bilateral upper extremities  LOWER EXTREMITY ROM:  WFL  LOWER EXTREMITY MMT:   Initial eval: Bilateral hips generally 4-/5, bilateral knees generally 4/5, ankles 5/5  02/18/24:  Bilateral hips generally 4+/5, bilateral knees generally 4+/5, ankles 5/5  LOWER EXTREMITY SPECIAL TESTS:  Hip special tests: Portia Brittle (FABER) test: positive  and Thomas test: positive  Knee special tests: did not need this one.  FUNCTIONAL TESTS:  Eval: 5 times sit to stand: 16.49 sec Timed  up and go (TUG): 12.18  1/202/2024: 3 minute walk test:  529 ft with RPE of 2-3/10   02/18/24: 5 times sit to stand: 9.98 sec Timed up and go (TUG): 9.36 sec 3 min walk test: 584.9 feet   GAIT: Distance walked: 30 feet Assistive device utilized: None Level of assistance: Complete Independence Comments: antalgic start up then short step length with limited heel strike                                                                                                                                TODAY'S TREATMENT DATE: 03/23/2024 Nustep x 7 min 30 sec level 3  Leg Press 30 lbs x 20 Hip matrix 25 lbs 2 x 10 hip abduction and extension both sides Sit to stand with 2 lb dumbbells fwd press x 10 Sit to stand with 2 lb dumbbells overhead press x 10 Seated mini sit ups 2 x 10 with 2 x 2 lbs dumbbells  Seated in reclined position: hip to hip with 2 x 2 lb dumbbells Lateral band walks with blue loop 2 laps of apprx 15 feet in hallway with hands on wall Seated hip abduction with blue loop x 20 Seated LAQ 2 x 10 with 4 lb ankle weights Seated  hamstring curls with red tband x 20 each LE  DATE: 03/17/2024 Nustep x 6 min level 3  Seated 3 direction ball roll outs with green physio ball x 10 each direction Seated hamstring stretch 10 x 10 sec hold Seated on blue physio ball marching x 20 Seated on blue ball alternating arm raises x 20 Seated on blue ball alternating arm and leg x 20 Seated on blue ball elbow to opposite knee x 10 on each side Seated piriformis stretch 3 x 30 sec each side Seated mini sit ups 2 x 10 with 5 lbs Reclined position shoulder to hip x 10 each side  DATE: 03/11/2024 Seated 3 direction ball roll outs with green physio ball x 10 each direction Education on spinal stenosis and anatomy of the spine.  Proper sitting surfaces and body mechanics.  Supine hamstring stretch 10 x 10 sec hold Supine DKTC x 10 holding 10 sec each Supine PPT x 20 PPT with marching x 20 PPT with alternating arms and legs x 20 Seated mini sit ups 2 x 10 with 5 lbs Reclined position shoulder to hip x 10 each side  DATE: 02/26/2024 UBE x 6 min  Level 1 (3 fwd/3 back) (PT present to discuss status) Standing hamstring stretch 3 x 30 sec each LE Standing hip quad stretch 3 x 30 sec each LE Seated piriformis stretch 3 x 30 sec each (right hip extremely tight) Tband standing shoulder extension with red band 2 x 10 Tband standing shoulder rows with red band 2 x 10 Seated bilateral shoulder ER with red band 2 x 10 Seated bilateral shoulder horizontal abduction 2 x 10 with red tband  MELT rotations x 2 min, nods x 2 min, and figure 8's x 2 min.   DATE: 02/18/2024 Nustep x 5 min  Level 3 (PT present to discuss status) Re-assessment completed Goals addressed Reviewed HEP and how to disperse throughout the day DATE: 12/05/23 Initial eval completed and initiated HEP    PATIENT EDUCATION:  Education details: Initiated HEP Person educated: Patient Education method: Programmer, multimedia, Facilities manager, Verbal cues, and Handouts Education  comprehension: verbalized understanding, returned demonstration, and verbal cues required  HOME EXERCISE PROGRAM: Access Code: [email or web address] URL: [email or web address] Date: 12/09/2023 Prepared by: Chaneta Comer Menke  Exercises - Standing Quad Stretch with Table and Chair Support  - 1 x daily - 7 x weekly - 1 sets - 3 reps - 30 sec hold - Seated Piriformis Stretch with Trunk Bend  - 1 x daily - 7 x weekly - 3 reps - 30 sec hold - Seated Piriformis Stretch  - 1 x daily - 7 x weekly - 2 reps - 20 sec hold - Seated Hamstring Stretch  - 1 x daily - 7 x weekly - 2 reps - 20 sec hold - Seated Long Arc Quad  - 1 x daily - 7 x weekly - 2 sets - 10 reps - Seated Pelvic Floor Contraction with Isometric Hip Adduction  - 1 x daily - 7 x weekly - 2 sets - 10 reps - Seated Hip Abduction with Resistance  - 1 x daily - 7 x weekly - 2 sets - 10 reps  ASSESSMENT:  CLINICAL IMPRESSION: Luwana was able to complete all exercises with ease.  She is progressing toward final goals.   She would benefit from continuing skilled PT to address goals below.     OBJECTIVE IMPAIRMENTS: Abnormal gait, decreased balance, decreased coordination, decreased mobility, difficulty walking, decreased ROM, decreased strength, increased fascial restrictions, increased muscle spasms, impaired flexibility, postural dysfunction, and pain.   ACTIVITY LIMITATIONS: carrying, lifting, bending, sitting, standing, squatting, sleeping, stairs, transfers, bed mobility, continence, bathing, toileting, dressing, hygiene/grooming, and caring for others  PARTICIPATION LIMITATIONS: meal prep, cleaning, laundry, driving, shopping, community activity, yard work, and church  PERSONAL FACTORS: Age, Fitness, Time since onset of injury/illness/exacerbation, and 3+ comorbidities: previous back surgery, prior hip surgery x 2, Htn, Osteopenia  are also affecting patient's functional outcome.   REHAB POTENTIAL: Fair multiple former orthopedic issues  and age  CLINICAL DECISION MAKING: Evolving/moderate complexity  EVALUATION COMPLEXITY: Moderate   GOALS: Goals reviewed with patient? Yes  SHORT TERM GOALS: Target date: 01/02/2024 ( C spine goal date 02/05/2024)   Pain report to be no greater than 4/10  Baseline: Goal status: MET for knees ( will continue to work on Cspine pain level)  2.  Patient will be independent with initial HEP for knees and Cspine Baseline:  Goal status: MET 01/21/24   LONG TERM GOALS: Target date: 01/30/2024 (C spine long term goal date 02/19/2024)   Patient to report pain no greater than 2/10 Baseline:  Goal status:  (knee goal met, will leave in place for c spine)  2.  Patient to be independent with advanced HEP  Baseline:  Goal status: (knee goal met, will leave in place for c spine)  3.  Patient to be able to bend, stoop and squat with pain no greater than 2/10  Baseline:  Goal status: In progress (knees)  4.  Patient to be able to ascend and descend steps without pain or no greater than 2/10  Baseline:  Goal status: In progress (knees)  5.  Patient to be able to stand or walk for at least 15 min without leg pain  Baseline:  Goal status: In progress (knees)  6.  LEFS score to improve by 2-4 points Baseline:  Goal status: In progress  7. Patient to be able to sleep through the night   Baseline:  Goal status: NEW  8. Patient to demonstrate full Cspine ROM without pain  Baseline:  Goal status: NEW   PLAN:  PT FREQUENCY: 1-2x/week  PT DURATION: 8 weeks  PLANNED INTERVENTIONS: 97110-Therapeutic exercises, 97530- Therapeutic activity, [id number]- Neuromuscular re-education, 97535- Self Care, 52841- Manual therapy, [id number]- Gait training, [phone]- Aquatic Therapy, 97014- Electrical stimulation (unattended), [id number]- Electrical stimulation (manual), 97016- Vasopneumatic device, [id number]- Ultrasound, [id number]- Ionotophoresis 4mg /ml Dexamethasone , Patient/Family education, Balance training, Stair training,  Taping, Dry Needling, Joint mobilization, Spinal mobilization, Compression bandaging, Vestibular training, Visual/preceptual remediation/compensation, DME instructions, Cryotherapy, and Moist heat  PLAN FOR NEXT SESSION: UBE or Nustep, functional strengthening, core and  postural strengthening, avoid over extension C spine.      [facility] B. Eliab Closson, PT 03/23/24 5:32 PM [facility] Special[facility] Rehab [facility] [address] [place], [place] [address] Phone # [phone] Fax [phone]

## 2024-03-26 NOTE — Progress Notes (Signed)
 Office Visit Note  Patient: Debbie Houston             Date of Birth: October 25, 1941           MRN: [id number]             PCP: Espinoza, Alejandra, DO Referring: Vangie Genet, MD Visit Date: 03/31/2024 Occupation: @GUAROCC @  Subjective:  Multiple joints  History of Present Illness: Debbie Houston is a 83 y.o. female with osteoarthritis, degenerative disc disease and positive ANA.  She returns today after her last visit in October 01, 2023.  She continues to have pain and discomfort in her hands.  She has history of osteoarthritis hands. Right knee is replaced which continues to hurt.  She also has left knee joint discomfort.  She has discomfort in her feet due to underlying osteoarthritis feet.  She also has neuropathy in her hands and feet which causes discomfort. She continues to have neck pain.  She states she has an appointment coming up with Dr. Ellery Guthrie.  She has disc disease of cervical spine .  She continues to have lower back pain. She is on Prolia  by Dr. Perri Brake.  She finished a course of Evenity .  She believes that her recent bone density is better.  She is also going for physical therapy. She has microscopic colitis, patient was evaluated by gastroenterologist on December 10, 2023.  She was advised to continue Velsipity  and vancomycin .  Her perirectal abscess healed.  She was also advised PPI and H2 blockers at night.    Activities of Daily Living:  Patient reports morning stiffness for several hours.   Patient Reports nocturnal pain.  Difficulty dressing/grooming: Denies Difficulty climbing stairs: Reports Difficulty getting out of chair: Reports Difficulty using hands for taps, buttons, cutlery, and/or writing: Reports  Review of Systems  Constitutional:  Positive for fatigue.  HENT:  Positive for mouth sores and mouth dryness.   Eyes:  Positive for dryness.  Respiratory:  Negative for shortness of breath.   Cardiovascular:  Negative for chest pain and palpitations.   Gastrointestinal:  Positive for diarrhea. Negative for blood in stool and constipation.  Endocrine: Negative for increased urination.  Genitourinary:  Negative for involuntary urination.  Musculoskeletal:  Positive for joint pain, gait problem, joint pain, myalgias, muscle weakness, morning stiffness, muscle tenderness and myalgias. Negative for joint swelling.  Skin:  Negative for color change, rash, hair loss and sensitivity to sunlight.  Allergic/Immunologic: Positive for susceptible to infections.  Neurological:  Positive for dizziness. Negative for headaches.  Hematological:  Negative for swollen glands.  Psychiatric/Behavioral:  Positive for sleep disturbance. Negative for depressed mood. The patient is nervous/anxious.     PMFS History:  Patient Active Problem List   Diagnosis Date Noted   Hyponatremia 09/26/2023   Cellulitis of left buttock 09/26/2023   Proctitis 09/25/2023   History of microscopic colitis 09/25/2023   GERD (gastroesophageal reflux disease) 09/25/2023   Sepsis secondary to UTI (HCC) 06/24/2022   Extensor tenosynovitis of right wrist 06/23/2022   Thrombocytopenia (HCC) 06/23/2022   Prediabetes 06/23/2022   Intertrochanteric fracture of right femur, closed, with nonunion, subsequent encounter 01/22/2022   History of ulcerative colitis 07/10/2021   Complete right bundle branch block (RBBB) 05/15/2021   Body mass index (BMI) of 19.0-19.9 in adult 05/12/2021   Bilateral nephrolithiasis 09/21/2020   Senile purpura (HCC) 08/17/2020   Bilateral temporomandibular joint pain 06/15/2020   Spondylolisthesis at L4-L5 level 11/25/2018   Essential hypertension    Hyperlipidemia  Hypothyroidism    GERD    Asthma    DJD (degenerative joint disease)    Vitamin D  deficiency    Osteoporosis with current pathological fracture    Allergy     Past Medical History:  Diagnosis Date   Arthritis    Benign labile hypertension    Cancer (HCC)    skin cancer on face    Cellulitis    Colitis    DJD (degenerative joint disease)    Endometrial polyp    GERD (gastroesophageal reflux disease)    History of basal cell carcinoma excision    History of esophagitis    HOH (hard of hearing)    right ear better than left  -- wears no aides   Hyperlipidemia    Hypothyroidism    Neuromuscular disorder (HCC)    neuropathy in feet   Osteopenia    Prediabetes    S/P right hip fracture 08/17/2020   Sepsis (HCC) 2023   per patient   Spinal stenosis 2025   per patient   Vitamin D  deficiency    Wears glasses     Family History  Problem Relation Age of Onset   Hypertension Mother    Cancer Mother        COLON   Hyperlipidemia Mother    Cancer Father        BREAST WITH BRAIN METS   Hyperlipidemia Father    Cancer Sister        COLON (TEENS), BREAST (50)   Hyperlipidemia Sister    Diabetes Brother    Hyperlipidemia Brother    Hypertension Brother    Colon cancer Brother 77   Cancer Maternal Grandmother        RENAL   Hyperlipidemia Maternal Grandmother    Diabetes Maternal Grandfather    Stroke Maternal Grandfather    Hyperlipidemia Maternal Grandfather    Hyperlipidemia Paternal Grandmother    Hyperlipidemia Paternal Grandfather    Autism Son    Colitis Son    Autism spectrum disorder Son    Healthy Son    Past Surgical History:  Procedure Laterality Date   BACK SURGERY  2020   BIOPSY  09/27/2023   Procedure: BIOPSY;  Surgeon: Ozell Blunt, MD;  Location: [facility] ENDOSCOPY;  Service: Gastroenterology;;   CATARACT EXTRACTION, BILATERAL Bilateral 2020   CONVERSION TO TOTAL HIP Right 01/22/2022   Procedure: CONVERSION TO TOTAL HIP;  Surgeon: Murleen Arms, MD;  Location: [facility] ORS;  Service: Orthopedics;  Laterality: Right;   DILATATION & CURETTAGE/HYSTEROSCOPY WITH MYOSURE N/A 10/27/2014   Procedure: DILATATION & CURETTAGE/HYSTEROSCOPY WITH MYOSURE;  Surgeon: Chandler Combs, MD;  Location: [facility];  Service: Gynecology;  Laterality:  N/A;   DILATION AND CURETTAGE OF UTERUS     FLEXIBLE SIGMOIDOSCOPY N/A 09/27/2023   Procedure: FLEXIBLE SIGMOIDOSCOPY;  Surgeon: Ozell Blunt, MD;  Location: [facility] ENDOSCOPY;  Service: Gastroenterology;  Laterality: N/A;   HIP SURGERY Right 04/2020   rod and screw in right hip    MOHS SURGERY  2005   LEFT NASAL BRIDGE FOR BASAL CELL   TONSILLECTOMY  age 33   Social History   Social History Narrative   Are you right handed or left handed? Left Handed    Are you currently employed ? No   What is your current occupation? No. Use to teacher [employer]    Do you live at home alone? No   Who lives with you? Lives with husband    What type  of home do you live in: 1 story or 2 story? One story home              Immunization History  Administered Date(s) Administered   Influenza, High Dose Seasonal PF 07/23/2014, 08/23/2015, 07/10/2016, 08/27/2017, 08/22/2018, 08/25/2019, 08/30/2020, 08/21/2021, 08/21/2022, 08/05/2023   Moderna SARS-COV2 Booster Vaccination 08/21/2022   PFIZER(Purple Top)SARS-COV-2 Vaccination 12/10/2019, 12/31/2019, 08/16/2020, 04/14/2021   Pneumococcal Conjugate-13 05/05/2015   Pneumococcal-Unspecified 08/22/2011   Tdap 08/22/2011   Unspecified SARS-COV-2 Vaccination 08/05/2023   Zoster Recombinant(Shingrix) 09/13/2017, 01/01/2018   Zoster, Live 06/20/2006     Objective: Vital Signs: BP (!) 145/78 (BP Location: Left Arm, Patient Position: Sitting, Cuff Size: Normal)   Pulse 62   Resp 13   Ht 5\' 5"  (1.651 m)   Wt 113 lb 3.2 oz (51.3 kg)   BMI 18.84 kg/m    Physical Exam Vitals and nursing note reviewed.  Constitutional:      Appearance: She is well-developed.  HENT:     Head: Normocephalic and atraumatic.  Eyes:     Conjunctiva/sclera: Conjunctivae normal.  Cardiovascular:     Rate and Rhythm: Normal rate and regular rhythm.     Heart sounds: Normal heart sounds.  Pulmonary:     Effort: Pulmonary effort is normal.     Breath sounds: Normal breath  sounds.  Abdominal:     General: Bowel sounds are normal.     Palpations: Abdomen is soft.  Musculoskeletal:     Cervical back: Normal range of motion.  Lymphadenopathy:     Cervical: No cervical adenopathy.  Skin:    General: Skin is warm and dry.     Capillary Refill: Capillary refill takes less than 2 seconds.  Neurological:     Mental Status: She is alert and oriented to person, place, and time.  Psychiatric:        Behavior: Behavior normal.      Musculoskeletal Exam: She had limited lateral rotation of the cervical spine.  She had limited forward flexion of the lumbar spine with some discomfort.  Shoulders, elbows, wrist joints were in good range of motion.  She had bilateral CMC PIP and DIP thickening with subluxation of several DIP joints.  No synovitis was noted.  Right hip joint was replaced and had good range of motion.  Left hip joint had good range of motion.  Knee joints in good range of motion without any warmth swelling or effusion.  There was no tenderness over ankles or MTPs.  CDAI Exam: CDAI Score: -- Patient Global: --; Provider Global: -- Swollen: --; Tender: -- Joint Exam 03/31/2024   No joint exam has been documented for this visit   There is currently no information documented on the homunculus. Go to the Rheumatology activity and complete the homunculus joint exam.  Investigation: No additional findings.  Imaging: No results found.  Recent Labs: Lab Results  Component Value Date   WBC 5.4 01/29/2024   HGB 12.7 01/29/2024   PLT 193 01/29/2024   NA 133 (L) 01/29/2024   K 4.1 01/29/2024   CL 98 01/29/2024   CO2 26 01/29/2024   GLUCOSE 121 (H) 01/29/2024   BUN 17 01/29/2024   CREATININE 0.88 01/29/2024   BILITOT 0.5 01/29/2024   ALKPHOS 42 01/29/2024   AST 26 01/29/2024   ALT 19 01/29/2024   PROT 6.1 (L) 01/29/2024   ALBUMIN 3.8 01/29/2024   CALCIUM  9.3 01/29/2024   GFRAA 86 05/15/2021    Speciality Comments: Stelara  March 06, 2023  Procedures:  No procedures performed Allergies: Remicade  [infliximab ], Fosamax [alendronate sodium], Montelukast, Singulair [montelukast sodium], Wellbutrin [bupropion], Clindamycin /lincomycin, and Sulfa antibiotics   Assessment / Plan:     Visit Diagnoses: Positive ANA (antinuclear antibody) - 10/22/19: ANA 1:80 NS, 01/06/20: ENA negative.  Patient denies any history of oral ulcers, nasal ulcers, malar rash, photosensitivity, Raynaud's, lymphadenopathy or inflammatory arthritis.  She has no clinical features of systemic lupus.  Primary osteoarthritis of both hands-she had bilateral CMC PIP and DIP thickening.  Subluxation of DIP joints was noted.  Joint protection muscle strengthening was discussed.  S/P hip replacement, right - Dr. Pryor Browning.  She had good range of motion without discomfort.  Patient states that she used to do water  aerobics in the past and she feels generalized weakness.  She had no muscular weakness on the examination.  She was encouraged to go to the gym again and start doing water  aerobics.  Primary osteoarthritis of both feet-proper fitting shoes were advised.  DDD (degenerative disc disease), cervical-she has some limitation with lateral rotation of the cervical spine.  She states that she has an appointment coming up with Dr. Ellery Guthrie for neck and lower back pain.  Spondylolisthesis at L4-L5 level - Followed by Dr. Ellery Guthrie.  Age-related osteoporosis without current pathological fracture - DEXA scan from December 26, 2020 T score was -2.5 BMD 0.569 in the lumbar spine.  Patient states that she finished the course of Evenity  and is on Prolia  injections.  She has been getting DEXA scan through Dr. Perri Brake.  Vitamin D  deficiency-she is on calcium  and vitamin D .  Vitamin D  levels are followed by Dr. Perri Brake.  Abnormal SPEP  Other microscopic colitis - Under care of [facility] gastroenterology. recent hospitalization from 09/25/2023 through 09/28/2023.  Her last visit was in  January 2025.  She is on Velsipity  and vancomycin .  Patient states she had to reduce the dose of Velsipity  due to side effects.  She had a perirectal abscess which healed.  She was also advised to take PPIs and H2 blockers.  Other medical problems are listed as follows:  History of gastroesophageal reflux (GERD)  Clostridioides difficile infection  History of hyperlipidemia  Essential hypertension  History of hypothyroidism  History of asthma  Gait instability  Orders: No orders of the defined types were placed in this encounter.  No orders of the defined types were placed in this encounter.    Follow-Up Instructions: Return for Osteoarthritis, positive ANA.   Nicholas Bari, MD  Note - This record has been created using Animal nutritionist.  Chart creation errors have been sought, but may not always  have been located. Such creation errors do not reflect on  the standard of medical care.

## 2024-03-30 ENCOUNTER — Ambulatory Visit: Payer: BLUE CROSS/BLUE SHIELD | Admitting: Nurse Practitioner

## 2024-03-30 DIAGNOSIS — R519 Headache, unspecified: Secondary | ICD-10-CM | POA: Diagnosis not present

## 2024-03-30 NOTE — Addendum Note (Signed)
 Addended by: Aletha Anderson B on: 03/30/2024 05:15 PM   Modules accepted: Orders

## 2024-03-31 ENCOUNTER — Encounter: Payer: Self-pay | Admitting: Rheumatology

## 2024-03-31 ENCOUNTER — Ambulatory Visit: Payer: Medicare Other | Attending: Rheumatology | Admitting: Rheumatology

## 2024-03-31 VITALS — BP 145/76 | HR 64 | Resp 13 | Ht 65.0 in | Wt 113.2 lb

## 2024-03-31 DIAGNOSIS — R2681 Unsteadiness on feet: Secondary | ICD-10-CM

## 2024-03-31 DIAGNOSIS — Z96641 Presence of right artificial hip joint: Secondary | ICD-10-CM | POA: Diagnosis not present

## 2024-03-31 DIAGNOSIS — R778 Other specified abnormalities of plasma proteins: Secondary | ICD-10-CM | POA: Diagnosis not present

## 2024-03-31 DIAGNOSIS — A498 Other bacterial infections of unspecified site: Secondary | ICD-10-CM | POA: Diagnosis not present

## 2024-03-31 DIAGNOSIS — M810 Age-related osteoporosis without current pathological fracture: Secondary | ICD-10-CM

## 2024-03-31 DIAGNOSIS — R768 Other specified abnormal immunological findings in serum: Secondary | ICD-10-CM | POA: Diagnosis not present

## 2024-03-31 DIAGNOSIS — M5030 Other cervical disc degeneration, unspecified cervical region: Secondary | ICD-10-CM

## 2024-03-31 DIAGNOSIS — Z8719 Personal history of other diseases of the digestive system: Secondary | ICD-10-CM

## 2024-03-31 DIAGNOSIS — M4316 Spondylolisthesis, lumbar region: Secondary | ICD-10-CM | POA: Diagnosis not present

## 2024-03-31 DIAGNOSIS — Z9849 Cataract extraction status, unspecified eye: Secondary | ICD-10-CM | POA: Diagnosis not present

## 2024-03-31 DIAGNOSIS — Z8639 Personal history of other endocrine, nutritional and metabolic disease: Secondary | ICD-10-CM

## 2024-03-31 DIAGNOSIS — K52838 Other microscopic colitis: Secondary | ICD-10-CM

## 2024-03-31 DIAGNOSIS — M19071 Primary osteoarthritis, right ankle and foot: Secondary | ICD-10-CM | POA: Diagnosis not present

## 2024-03-31 DIAGNOSIS — M19072 Primary osteoarthritis, left ankle and foot: Secondary | ICD-10-CM

## 2024-03-31 DIAGNOSIS — M19041 Primary osteoarthritis, right hand: Secondary | ICD-10-CM

## 2024-03-31 DIAGNOSIS — E559 Vitamin D deficiency, unspecified: Secondary | ICD-10-CM

## 2024-03-31 DIAGNOSIS — M19042 Primary osteoarthritis, left hand: Secondary | ICD-10-CM

## 2024-03-31 DIAGNOSIS — H53143 Visual discomfort, bilateral: Secondary | ICD-10-CM | POA: Diagnosis not present

## 2024-03-31 DIAGNOSIS — I10 Essential (primary) hypertension: Secondary | ICD-10-CM

## 2024-03-31 DIAGNOSIS — Z961 Presence of intraocular lens: Secondary | ICD-10-CM | POA: Diagnosis not present

## 2024-03-31 DIAGNOSIS — Z8709 Personal history of other diseases of the respiratory system: Secondary | ICD-10-CM

## 2024-04-01 ENCOUNTER — Ambulatory Visit

## 2024-04-01 DIAGNOSIS — G8929 Other chronic pain: Secondary | ICD-10-CM

## 2024-04-01 DIAGNOSIS — M25552 Pain in left hip: Secondary | ICD-10-CM

## 2024-04-01 DIAGNOSIS — M542 Cervicalgia: Secondary | ICD-10-CM

## 2024-04-01 DIAGNOSIS — M25561 Pain in right knee: Secondary | ICD-10-CM | POA: Diagnosis not present

## 2024-04-01 DIAGNOSIS — R293 Abnormal posture: Secondary | ICD-10-CM

## 2024-04-01 DIAGNOSIS — M6281 Muscle weakness (generalized): Secondary | ICD-10-CM

## 2024-04-01 DIAGNOSIS — M25551 Pain in right hip: Secondary | ICD-10-CM

## 2024-04-01 DIAGNOSIS — R262 Difficulty in walking, not elsewhere classified: Secondary | ICD-10-CM

## 2024-04-01 DIAGNOSIS — R252 Cramp and spasm: Secondary | ICD-10-CM

## 2024-04-01 DIAGNOSIS — M5459 Other low back pain: Secondary | ICD-10-CM

## 2024-04-01 NOTE — Therapy (Signed)
 OUTPATIENT PHYSICAL THERAPY TREATMENT NOTE   Patient Name: Debbie Houston MRN: [id number] DOB:02-Dec-1940, 83 y.o., female Today's Date: 04/01/2024  END OF SESSION:  PT End of Session - 04/01/24 1409     Visit Number 15    Date for PT Re-Evaluation 04/14/24    Authorization Type Medicare    Progress Note Due on Visit 20    PT Start Time 1405    PT Stop Time 1445    PT Time Calculation (min) 40 min    Activity Tolerance Patient tolerated treatment well    Behavior During Therapy WFL for tasks assessed/performed              Past Medical History:  Diagnosis Date   Arthritis    Benign labile hypertension    Cancer (HCC)    skin cancer on face   Cellulitis    Colitis    DJD (degenerative joint disease)    Endometrial polyp    GERD (gastroesophageal reflux disease)    History of basal cell carcinoma excision    History of esophagitis    HOH (hard of hearing)    right ear better than left  -- wears no aides   Hyperlipidemia    Hypothyroidism    Neuromuscular disorder (HCC)    neuropathy in feet   Osteopenia    Prediabetes    S/P right hip fracture 08/17/2020   Sepsis (HCC) 2023   per patient   Spinal stenosis 2025   per patient   Vitamin D  deficiency    Wears glasses    Past Surgical History:  Procedure Laterality Date   BACK SURGERY  2020   BIOPSY  09/27/2023   Procedure: BIOPSY;  Surgeon: Ozell Blunt, MD;  Location: [facility] ENDOSCOPY;  Service: Gastroenterology;;   CATARACT EXTRACTION, BILATERAL Bilateral 2020   CONVERSION TO TOTAL HIP Right 01/22/2022   Procedure: CONVERSION TO TOTAL HIP;  Surgeon: Murleen Arms, MD;  Location: [facility] ORS;  Service: Orthopedics;  Laterality: Right;   DILATATION & CURETTAGE/HYSTEROSCOPY WITH MYOSURE N/A 10/27/2014   Procedure: DILATATION & CURETTAGE/HYSTEROSCOPY WITH MYOSURE;  Surgeon: Chandler Combs, MD;  Location: [facility] ;  Service: Gynecology;  Laterality: N/A;   DILATION AND CURETTAGE OF UTERUS      FLEXIBLE SIGMOIDOSCOPY N/A 09/27/2023   Procedure: FLEXIBLE SIGMOIDOSCOPY;  Surgeon: Ozell Blunt, MD;  Location: [facility] ENDOSCOPY;  Service: Gastroenterology;  Laterality: N/A;   HIP SURGERY Right 04/2020   rod and screw in right hip    MOHS SURGERY  2005   LEFT NASAL BRIDGE FOR BASAL CELL   TONSILLECTOMY  age 66   Patient Active Problem List   Diagnosis Date Noted   Hyponatremia 09/26/2023   Cellulitis of left buttock 09/26/2023   Proctitis 09/25/2023   History of microscopic colitis 09/25/2023   GERD (gastroesophageal reflux disease) 09/25/2023   Sepsis secondary to UTI (HCC) 06/24/2022   Extensor tenosynovitis of right wrist 06/23/2022   Thrombocytopenia (HCC) 06/23/2022   Prediabetes 06/23/2022   Intertrochanteric fracture of right femur, closed, with nonunion, subsequent encounter 01/22/2022   History of ulcerative colitis 07/10/2021   Complete right bundle branch block (RBBB) 05/15/2021   Body mass index (BMI) of 19.0-19.9 in adult 05/12/2021   Bilateral nephrolithiasis 09/21/2020   Senile purpura (HCC) 08/17/2020   Bilateral temporomandibular joint pain 06/15/2020   Spondylolisthesis at L4-L5 level 11/25/2018   Essential hypertension    Hyperlipidemia    Hypothyroidism    GERD    Asthma  DJD (degenerative joint disease)    Vitamin D  deficiency    Osteoporosis with current pathological fracture    Allergy     PCP:   Vangie Genet, MD    REFERRING PROVIDER: Shermon Divine, MD  REFERRING DIAG:  Diagnosis  M17.0 (ICD-10-CM) - Bilateral primary osteoarthritis of knee  R29.898 (ICD-10-CM) - Hip weakness  M54.50 (ICD-10-CM) - Low back pain    THERAPY DIAG:  Abnormal posture  Pain in right hip  Difficulty in walking, not elsewhere classified  Pain in left hip  Chronic pain of both knees  Cramp and spasm  Muscle weakness (generalized)  Other low back pain  Cervicalgia  Rationale for Evaluation and Treatment: Rehabilitation  ONSET DATE:  12/03/2023  SUBJECTIVE:   SUBJECTIVE STATEMENT: No new complaints.      PERTINENT HISTORY: Right hip fx from fall 2 years ago, initial ORIF, then subsequent THA.   PAIN:  03/17/24 Are you having pain? Yes: NPRS scale: 2/10 back, legs  Pain location: knees, hips, low back  Pain description: aching Aggravating factors: prolonged sitting or standing Relieving factors: Tylenol  arthritis  PRECAUTIONS: None  RED FLAGS: Bowel or bladder incontinence: Yes: age related   WEIGHT BEARING RESTRICTIONS: No  FALLS:  Has patient fallen in last 6 months? No  LIVING ENVIRONMENT: Lives with: lives with their family and lives with their spouse Lives in: House/apartment Stairs: Yes: Internal: 12 steps; can reach both and External: 4 steps; can reach both Has following equipment at home: None  OCCUPATION: retired  PLOF: Independent, Independent with basic ADLs, Independent with household mobility without device, Independent with community mobility without device, Independent with homemaking with ambulation, Independent with gait, Independent with transfers, Vocation/Vocational requirements: retired, and Leisure: read, watch TV, go out with friends and go to the beach  PATIENT GOALS: to be able to walk and do routine daily activities with manageable pain/ to be able to turn my head and sleep without pain in my neck.   NEXT MD VISIT: na  OBJECTIVE:  Note: Objective measures were completed at Evaluation unless otherwise noted.  DIAGNOSTIC FINDINGS: na  PATIENT SURVEYS:  Eval:  LEFS 41/80  02/18/24: LEFS 56/80= 70%  COGNITION: Overall cognitive status: Within functional limits for tasks assessed     SENSATION: WFL   MUSCLE LENGTH: Initial eval: Hamstrings: Right 40 deg; Left 45 deg Thomas test: Right pos; Left pos  02/18/24: Hamstrings: Right 50; Left 50  POSTURE: rounded shoulders and forward head  PALPATION: Palpable crepitus bilateral knees on seated flexion /  extension  CERVICAL ROM: Eval: Flexion: 40 deg Extension: 40 deg SB right : 25 deg SB left : 22 deg Rot right: 40 deg Rot left: 45 deg  02/18/24: Flexion: 50 deg Extension: 40 deg SB right : 30 deg SB left : 28 deg Rot right: 50 deg Rot left: 55 deg  UPPER EXTREMITY ROM:     WFL  UPPER EXTREMITY MMT:  02/18/24: Generally 4+/5 to 5/5 bilateral upper extremities  LOWER EXTREMITY ROM:  WFL  LOWER EXTREMITY MMT:   Initial eval: Bilateral hips generally 4-/5, bilateral knees generally 4/5, ankles 5/5  02/18/24:  Bilateral hips generally 4+/5, bilateral knees generally 4+/5, ankles 5/5  LOWER EXTREMITY SPECIAL TESTS:  Hip special tests: Portia Brittle (FABER) test: positive  and Thomas test: positive  Knee special tests: did not need this one.  FUNCTIONAL TESTS:  Eval: 5 times sit to stand: 16.49 sec Timed up and go (TUG): 12.18  1/202/2024: 3 minute walk  test:  529 ft with RPE of 2-3/10   02/18/24: 5 times sit to stand: 9.98 sec Timed up and go (TUG): 9.36 sec 3 min walk test: 584.9 feet   GAIT: Distance walked: 30 feet Assistive device utilized: None Level of assistance: Complete Independence Comments: antalgic start up then short step length with limited heel strike                                                                                                                                TODAY'S TREATMENT DATE: 04/01/2024 Nustep x 8 sec level 5 Sit to stand 2 x 5 Seated clam with yellow loop x 20 Seated LAQ 4 lbs 2 x 10 Seated march with 4 lbs 2 x 10 Seated hip ER with 4 lbs 2 x 10 3 D ball roll outs for lumbar stretching x 10 each direction Supine hamstring stretch x 5 hold 10 sec each Heel slides x 10 on each LE for knee and hip flexion using towel under heel Supine hip abduction 2 x 10 on each LE using towel   DATE: 03/23/2024 Nustep x 7 min 30 sec level 3  Leg Press 30 lbs x 20 Hip matrix 25 lbs 2 x 10 hip abduction and extension both sides Sit to  stand with 2 lb dumbbells fwd press x 10 Sit to stand with 2 lb dumbbells overhead press x 10 Seated mini sit ups 2 x 10 with 2 x 2 lbs dumbbells  Seated in reclined position: hip to hip with 2 x 2 lb dumbbells Lateral band walks with blue loop 2 laps of apprx 15 feet in hallway with hands on wall Seated hip abduction with blue loop x 20 Seated LAQ 2 x 10 with 4 lb ankle weights Seated hamstring curls with red tband x 20 each LE  DATE: 03/17/2024 Nustep x 6 min level 3  Seated 3 direction ball roll outs with green physio ball x 10 each direction Seated hamstring stretch 10 x 10 sec hold Seated on blue physio ball marching x 20 Seated on blue ball alternating arm raises x 20 Seated on blue ball alternating arm and leg x 20 Seated on blue ball elbow to opposite knee x 10 on each side Seated piriformis stretch 3 x 30 sec each side Seated mini sit ups 2 x 10 with 5 lbs Reclined position shoulder to hip x 10 each side  DATE: 12/05/23 Initial eval completed and initiated HEP    PATIENT EDUCATION:  Education details: Initiated HEP Person educated: Patient Education method: Programmer, multimedia, Facilities manager, Verbal cues, and Handouts Education comprehension: verbalized understanding, returned demonstration, and verbal cues required  HOME EXERCISE PROGRAM: Access Code: [email or web address] URL: [email or web address] Date: 12/09/2023 Prepared by: Chaneta Comer Menke  Exercises - Standing Quad Stretch with Table and Chair Support  - 1 x daily - 7 x weekly - 1 sets - 3 reps - 30 sec hold - Seated Piriformis Stretch with Trunk  Bend  - 1 x daily - 7 x weekly - 3 reps - 30 sec hold - Seated Piriformis Stretch  - 1 x daily - 7 x weekly - 2 reps - 20 sec hold - Seated Hamstring Stretch  - 1 x daily - 7 x weekly - 2 reps - 20 sec hold - Seated Long Arc Quad  - 1 x daily - 7 x weekly - 2 sets - 10 reps - Seated Pelvic Floor Contraction with Isometric Hip Adduction  - 1 x daily - 7 x weekly - 2 sets - 10  reps - Seated Hip Abduction with Resistance  - 1 x daily - 7 x weekly - 2 sets - 10 reps  ASSESSMENT:  CLINICAL IMPRESSION: Elianny continues to tolerate all tasks with minimal fatigue or pain. She is progressing toward final goals.   She would benefit from continuing skilled PT to address goals below.     OBJECTIVE IMPAIRMENTS: Abnormal gait, decreased balance, decreased coordination, decreased mobility, difficulty walking, decreased ROM, decreased strength, increased fascial restrictions, increased muscle spasms, impaired flexibility, postural dysfunction, and pain.   ACTIVITY LIMITATIONS: carrying, lifting, bending, sitting, standing, squatting, sleeping, stairs, transfers, bed mobility, continence, bathing, toileting, dressing, hygiene/grooming, and caring for others  PARTICIPATION LIMITATIONS: meal prep, cleaning, laundry, driving, shopping, community activity, yard work, and church  PERSONAL FACTORS: Age, Fitness, Time since onset of injury/illness/exacerbation, and 3+ comorbidities: previous back surgery, prior hip surgery x 2, Htn, Osteopenia are also affecting patient's functional outcome.   REHAB POTENTIAL: Fair multiple former orthopedic issues and age  CLINICAL DECISION MAKING: Evolving/moderate complexity  EVALUATION COMPLEXITY: Moderate   GOALS: Goals reviewed with patient? Yes  SHORT TERM GOALS: Target date: 01/02/2024 ( C spine goal date 02/05/2024)   Pain report to be no greater than 4/10  Baseline: Goal status: MET for knees ( will continue to work on Cspine pain level)  2.  Patient will be independent with initial HEP for knees and Cspine Baseline:  Goal status: MET 01/21/24   LONG TERM GOALS: Target date: 01/30/2024 (C spine long term goal date 02/19/2024)   Patient to report pain no greater than 2/10 Baseline:  Goal status:  (knee goal met, will leave in place for c spine)  2.  Patient to be independent with advanced HEP  Baseline:  Goal status: (knee goal  met, will leave in place for c spine)  3.  Patient to be able to bend, stoop and squat with pain no greater than 2/10  Baseline:  Goal status: In progress (knees)  4.  Patient to be able to ascend and descend steps without pain or no greater than 2/10  Baseline:  Goal status: In progress (knees)  5.  Patient to be able to stand or walk for at least 15 min without leg pain  Baseline:  Goal status: In progress (knees)  6.  LEFS score to improve by 2-4 points Baseline:  Goal status: In progress  7. Patient to be able to sleep through the night   Baseline:  Goal status: in progress  8. Patient to demonstrate full Cspine ROM without pain  Baseline:  Goal status: In progress   PLAN:  PT FREQUENCY: 1-2x/week  PT DURATION: 8 weeks  PLANNED INTERVENTIONS: 97110-Therapeutic exercises, 97530- Therapeutic activity, [id number]- Neuromuscular re-education, 97535- Self Care, 16109- Manual therapy, [id number]- Gait training, [phone]- Aquatic Therapy, 97014- Electrical stimulation (unattended), [id number]- Electrical stimulation (manual), [id number]- Vasopneumatic device, [id number]- Ultrasound, [id number]- Ionotophoresis 4mg /ml Dexamethasone , Patient/Family education, Balance training,  Stair training, Taping, Dry Needling, Joint mobilization, Spinal mobilization, Compression bandaging, Vestibular training, Visual/preceptual remediation/compensation, DME instructions, Cryotherapy, and Moist heat  PLAN FOR NEXT SESSION: UBE or Nustep, functional strengthening, core and  postural strengthening, avoid over extension C spine.      Bridgette Campus B. Josedejesus Marcum, PT 04/01/24 2:47 PM [facility] [address] [place], [place] [address] Phone # [phone] Fax [phone]

## 2024-04-02 NOTE — Unmapped (Signed)
 [facility] Clinical Assessment & Refill Coordination Note    Patient reports to be doing well since starting back on the Velsipity - understands that combination of Velsipity and bisoprolol can contribute to bradycardia and has been monitoring blood pressure and heart rate and will continue to update GI clinic with any changes     Traci Bennett, DOB: [birth year]  Phone: [phone] (home)     All above HIPAA information was verified with patient.     Was a Nurse, learning disability used for this call? No    Specialty Medication(s):   Inflammatory Disorders: Velsipity     Current Outpatient Medications   Medication Sig Dispense Refill    acetaminophen (TYLENOL) 500 MG tablet Take 1 tablet (500 mg total) by mouth.      ascorbic acid, vitamin C, (VITAMIN C) 1000 MG tablet Take 1 tablet (1,000 mg total) by mouth.      aspirin (ECOTRIN) 81 MG tablet Take 1 tablet (81 mg total) by mouth daily.      bisoprolol (ZEBETA) 5 MG tablet Take 1 tablet (5 mg total) by mouth daily.      calcium carbonate 1,500 mg (600 mg elem calcium) tablet 1 tab Orally once a day      calcium carbonate-vit D3-min 600 mg calcium- 400 unit Tab Take by mouth.      cetirizine (ZYRTEC) 10 MG tablet Take 1 tablet (10 mg total) by mouth.      cholecalciferol, vitamin D3-50 mcg, 2,000 unit,, 50 mcg (2,000 unit) tablet Take 1 tablet (50 mcg total) by mouth daily.      cinnamon bark 500 mg capsule Take 500 mg by mouth.      coenzyme Q10 200 mg capsule Take 1 capsule (200 mg total) by mouth daily.      colestipol (COLESTID) 1 gram tablet Take 4 tablets (4 g total) by mouth Three (3) times a day. (Patient taking differently: Take 2 tablets (2 g total) by mouth two (2) times a day.) 360 tablet 1    cycloSPORINE (RESTASIS) 0.05 % ophthalmic emulsion Administer 1 drop to both eyes every twelve (12) hours.      denosumab (PROLIA SUBQ) Inject under the skin every six (6) months.      etrasimod (VELSIPITY) 2 mg Tab Take 1 tablet (2 mg) by mouth in the morning. 30 tablet 0    famotidine (PEPCID) 40 MG tablet Take 10 mg by mouth every evening.      flaxseed oil 1,000 mg cap 1 tab Orally Once a day      fluticasone propionate (FLONASE) 50 mcg/actuation nasal spray 2 sprays into each nostril.      Lactobacillus acidophilus 10 billion cell cap Take 1 capsule by mouth daily.      levothyroxine 88 mcg cap Take 1 capsule (88 mcg total) by mouth daily. For 6 days, skip the 7th day      loperamide (IMODIUM) 2 mg capsule TAKE 1 CAPSULE BY MOUTH FOUR TIMES DAILY AS NEEDED FOR DIARRHEA 120 capsule 5    loratadine (CLARITIN) 10 mg tablet Take 1 tablet (10 mg total) by mouth daily.      meclizine (ANTIVERT) 25 mg tablet 1/2-1 pill up to 3 times daily for motion sickness/dizziness      multivitamin therapeutic with minerals (THERA-M) 27-0.4 mg Tab Take 1 tablet by mouth daily.      omega-3 fatty acids-fish oil 360-1,200 mg cap Take 1,200 mg by mouth.  pantoprazole (PROTONIX) 40 MG tablet TAKE 1 TABLET BY MOUTH TWICE DAILY BEFORE BREAKFAST AND BEFORE DINNER 180 tablet 3    simvastatin (ZOCOR) 20 MG tablet Take 1 tablet (20 mg total) by mouth daily before breakfast.      triamcinolone (KENALOG) 0.5 % ointment Apply 1 Application topically Two (2) times a day (at 8am and 12:00).      vancomycin (VANCOCIN) 125 MG capsule TAKE ONE CAPSULE BY MOUTH FOUR TIMES DAILY. (Patient taking differently: Take 1 capsule (125 mg total) by mouth two (2) times a day.) 120 capsule 2     No current facility-administered medications for this visit.        Changes to medications: Siarah reports no changes at this time.    Medication list has been reviewed and updated in Epic: Yes    Allergies   Allergen Reactions    Bupropion Hives and Other (See Comments)    Infliximab Other (See Comments)     Unknown    Montelukast Other (See Comments)     Reaction:  Makes pt jittery    Reaction:  Makes pt jittery      Reaction:  Makes pt jittery Makes pt jittery      Makes pt jittery    Montelukast Sodium Other (See Comments)     Reaction:  Makes pt jittery  Makes pt jittery      Alendronate Sodium Other (See Comments) and Rash     Reaction:  GI upset   GI upset       Clindamycin Rash    Other Rash    Sulfa (Sulfonamide Antibiotics) Rash       Changes to allergies: No    Allergies have been reviewed and updated in Epic: Yes    SPECIALTY MEDICATION ADHERENCE     Velsipity 2 mg: ~5 days of medicine on hand   Medication Adherence    Patient reported X missed doses in the last month: 0  Specialty Medication: Velsipity 2 mg -1qam  Patient is on additional specialty medications: No  Informant: patient          Specialty medication(s) dose(s) confirmed: Regimen is correct and unchanged.     Are there any concerns with adherence? No    Adherence counseling provided? Not needed    CLINICAL MANAGEMENT AND INTERVENTION      Clinical Benefit Assessment:    Do you feel the medicine is effective or helping your condition? Yes    Clinical Benefit counseling provided? Progress note from 4/22 shows evidence of clinical benefit    Adverse Effects Assessment:    Are you experiencing any side effects? No, was previously experiencing dizziness but has since resolved     Are you experiencing difficulty administering your medicine? No    Quality of Life Assessment:    Quality of Life    Rheumatology  Oncology  Dermatology  Cystic Fibrosis          How many days over the past month did your UC  keep you from your normal activities? For example, brushing your teeth or getting up in the morning. 0    Have you discussed this with your provider? Not needed    Acute Infection Status:    Acute infections noted within Epic:  No active infections    Patient reported infection: None    Therapy Appropriateness:    Is therapy appropriate based on current medication list, adverse reactions, adherence, clinical benefit and progress toward achieving therapeutic goals?  Yes, therapy is appropriate and should be continued     Clinical Intervention:    Was an intervention completed as part of this clinical assessment? No    DISEASE/MEDICATION-SPECIFIC INFORMATION      N/A    Chronic Inflammatory Diseases: Have you experienced any flares in the last month? No  Has this been reported to your provider? Not applicable    PATIENT SPECIFIC NEEDS     Does the patient have any physical, cognitive, or cultural barriers? No    Is the patient high risk? No    Does the patient require physician intervention or other additional services (i.e., nutrition, smoking cessation, social work)? No    Does the patient have an additional or emergency contact listed in their chart? Yes    SOCIAL DETERMINANTS OF HEALTH     At the [facility], we have learned that life circumstances - like trouble affording food, housing, utilities, or transportation can affect the health of many of our patients.   That is why we wanted to ask: are you currently experiencing any life circumstances that are negatively impacting your health and/or quality of life? No    Social Drivers of Health     Food Insecurity: No Food Insecurity (09/30/2023)    Received from [facility]    Hunger Vital Sign     Worried About Running Out of Food in the Last Year: Never true     Ran Out of Food in the Last Year: Never true   Tobacco Use: Medium Risk (12/10/2023)    Patient History     Smoking Tobacco Use: Former     Smokeless Tobacco Use: Never     Passive Exposure: Not on file   Transportation Needs: No Transportation Needs (09/30/2023)    Received from [facility] - Transportation     Lack of Transportation (Medical): No     Lack of Transportation (Non-Medical): No   Alcohol Use: Not on file   Housing: Low Risk  (07/11/2023)    Received from [facility] Stability Vital Sign     What is your living situation today?: I have a steady place to live     Think about the place you live. Do you have problems with any of the following? Choose all that apply:: None/None on this list   Physical Activity: Not on file   Utilities: Not At Risk (09/30/2023)    Received from [facility] Utilities     Threatened with loss of utilities: No   Stress: Not on file   Interpersonal Safety: Not on file   Substance Use: Not on file (09/23/2023)   Intimate Partner Violence: Not At Risk (09/30/2023)    Received from [facility]    Humiliation, Afraid, Rape, and Kick questionnaire     Fear of Current or Ex-Partner: No     Emotionally Abused: No     Physically Abused: No     Sexually Abused: No   Social Connections: Not on file   Financial Resource Strain: Not on file   Health Literacy: Not on file   Internet Connectivity: Not on file       Would you be willing to receive help with any of the needs that you have identified today? Not applicable       SHIPPING     Specialty Medication(s) to be Shipped:   Inflammatory Disorders: Velsipity    Other medication(s)  to be shipped: No additional medications requested for fill at this time     Changes to insurance: No    Cost and Payment: Patient has a $0 copay, payment information is not required.    Delivery Scheduled: Yes, Expected medication delivery date: 5/20.     Medication will be delivered via UPS to the confirmed prescription address in [facility].    The patient will receive a drug information handout for each medication shipped and additional FDA Medication Guides as required.  Verified that patient has previously received a Conservation officer, historic buildings and a Surveyor, mining.    The patient or caregiver noted above participated in the development of this care plan and knows that they can request review of or adjustments to the care plan at any time.      All of the patient's questions and concerns have been addressed.    Arlyce Berger, PharmD   [facility] Specialty Pharmacist

## 2024-04-06 ENCOUNTER — Ambulatory Visit: Payer: Medicare Other | Admitting: Neurology

## 2024-04-06 DIAGNOSIS — R519 Headache, unspecified: Secondary | ICD-10-CM | POA: Diagnosis not present

## 2024-04-07 ENCOUNTER — Ambulatory Visit

## 2024-04-07 DIAGNOSIS — R252 Cramp and spasm: Secondary | ICD-10-CM

## 2024-04-07 DIAGNOSIS — G8929 Other chronic pain: Secondary | ICD-10-CM

## 2024-04-07 DIAGNOSIS — R262 Difficulty in walking, not elsewhere classified: Secondary | ICD-10-CM

## 2024-04-07 DIAGNOSIS — R293 Abnormal posture: Secondary | ICD-10-CM

## 2024-04-07 DIAGNOSIS — M542 Cervicalgia: Secondary | ICD-10-CM

## 2024-04-07 DIAGNOSIS — M25551 Pain in right hip: Secondary | ICD-10-CM

## 2024-04-07 DIAGNOSIS — M25552 Pain in left hip: Secondary | ICD-10-CM

## 2024-04-07 DIAGNOSIS — M6281 Muscle weakness (generalized): Secondary | ICD-10-CM

## 2024-04-07 DIAGNOSIS — M5459 Other low back pain: Secondary | ICD-10-CM

## 2024-04-07 DIAGNOSIS — M25561 Pain in right knee: Secondary | ICD-10-CM | POA: Diagnosis not present

## 2024-04-07 NOTE — Therapy (Signed)
 OUTPATIENT PHYSICAL THERAPY TREATMENT NOTE   Patient Name: Debbie Houston MRN: [id number] DOB:1941/03/12, 83 y.o., female Today's Date: 04/07/2024  END OF SESSION:  PT End of Session - 04/07/24 1020     Visit Number 16    Date for PT Re-Evaluation 04/14/24    Authorization Type Medicare    Progress Note Due on Visit 20    PT Start Time 1020    PT Stop Time 1100    PT Time Calculation (min) 40 min    Activity Tolerance Patient tolerated treatment well    Behavior During Therapy WFL for tasks assessed/performed              Past Medical History:  Diagnosis Date   Arthritis    Benign labile hypertension    Cancer (HCC)    skin cancer on face   Cellulitis    Colitis    DJD (degenerative joint disease)    Endometrial polyp    GERD (gastroesophageal reflux disease)    History of basal cell carcinoma excision    History of esophagitis    HOH (hard of hearing)    right ear better than left  -- wears no aides   Hyperlipidemia    Hypothyroidism    Neuromuscular disorder (HCC)    neuropathy in feet   Osteopenia    Prediabetes    S/P right hip fracture 08/17/2020   Sepsis (HCC) 2023   per patient   Spinal stenosis 2025   per patient   Vitamin D  deficiency    Wears glasses    Past Surgical History:  Procedure Laterality Date   BACK SURGERY  2020   BIOPSY  09/27/2023   Procedure: BIOPSY;  Surgeon: Ozell Blunt, MD;  Location: [facility] ENDOSCOPY;  Service: Gastroenterology;;   CATARACT EXTRACTION, BILATERAL Bilateral 2020   CONVERSION TO TOTAL HIP Right 01/22/2022   Procedure: CONVERSION TO TOTAL HIP;  Surgeon: Murleen Arms, MD;  Location: [facility] ORS;  Service: Orthopedics;  Laterality: Right;   DILATATION & CURETTAGE/HYSTEROSCOPY WITH MYOSURE N/A 10/27/2014   Procedure: DILATATION & CURETTAGE/HYSTEROSCOPY WITH MYOSURE;  Surgeon: Chandler Combs, MD;  Location: [facility] [place];  Service: Gynecology;  Laterality: N/A;   DILATION AND CURETTAGE OF UTERUS      FLEXIBLE SIGMOIDOSCOPY N/A 09/27/2023   Procedure: FLEXIBLE SIGMOIDOSCOPY;  Surgeon: Ozell Blunt, MD;  Location: [facility] ENDOSCOPY;  Service: Gastroenterology;  Laterality: N/A;   HIP SURGERY Right 04/2020   rod and screw in right hip    MOHS SURGERY  2005   LEFT NASAL BRIDGE FOR BASAL CELL   TONSILLECTOMY  age 35   Patient Active Problem List   Diagnosis Date Noted   Hyponatremia 09/26/2023   Cellulitis of left buttock 09/26/2023   Proctitis 09/25/2023   History of microscopic colitis 09/25/2023   GERD (gastroesophageal reflux disease) 09/25/2023   Sepsis secondary to UTI (HCC) 06/24/2022   Extensor tenosynovitis of right wrist 06/23/2022   Thrombocytopenia (HCC) 06/23/2022   Prediabetes 06/23/2022   Intertrochanteric fracture of right femur, closed, with nonunion, subsequent encounter 01/22/2022   History of ulcerative colitis 07/10/2021   Complete right bundle branch block (RBBB) 05/15/2021   Body mass index (BMI) of 19.0-19.9 in adult 05/12/2021   Bilateral nephrolithiasis 09/21/2020   Senile purpura (HCC) 08/17/2020   Bilateral temporomandibular joint pain 06/15/2020   Spondylolisthesis at L4-L5 level 11/25/2018   Essential hypertension    Hyperlipidemia    Hypothyroidism    GERD    Asthma  DJD (degenerative joint disease)    Vitamin D  deficiency    Osteoporosis with current pathological fracture    Allergy     PCP:   Vangie Genet, MD    REFERRING PROVIDER: Shermon Divine, MD  REFERRING DIAG:  Diagnosis  M17.0 (ICD-10-CM) - Bilateral primary osteoarthritis of knee  R29.898 (ICD-10-CM) - Hip weakness  M54.50 (ICD-10-CM) - Low back pain    THERAPY DIAG:  Cramp and spasm  Muscle weakness (generalized)  Pain in right hip  Difficulty in walking, not elsewhere classified  Other low back pain  Abnormal posture  Pain in left hip  Chronic pain of both knees  Cervicalgia  Rationale for Evaluation and Treatment: Rehabilitation  ONSET DATE:  12/03/2023  SUBJECTIVE:   SUBJECTIVE STATEMENT: No new complaints.      PERTINENT HISTORY: Right hip fx from fall 2 years ago, initial ORIF, then subsequent THA.   PAIN:  03/17/24 Are you having pain? Yes: NPRS scale: 2/10 back, legs  Pain location: knees, hips, low back  Pain description: aching Aggravating factors: prolonged sitting or standing Relieving factors: Tylenol  arthritis  PRECAUTIONS: None  RED FLAGS: Bowel or bladder incontinence: Yes: age related   WEIGHT BEARING RESTRICTIONS: No  FALLS:  Has patient fallen in last 6 months? No  LIVING ENVIRONMENT: Lives with: lives with their family and lives with their spouse Lives in: House/apartment Stairs: Yes: Internal: 12 steps; can reach both and External: 4 steps; can reach both Has following equipment at home: None  OCCUPATION: retired  PLOF: Independent, Independent with basic ADLs, Independent with household mobility without device, Independent with community mobility without device, Independent with homemaking with ambulation, Independent with gait, Independent with transfers, Vocation/Vocational requirements: retired, and Leisure: read, watch TV, go out with friends and go to the beach  PATIENT GOALS: to be able to walk and do routine daily activities with manageable pain/ to be able to turn my head and sleep without pain in my neck.   NEXT MD VISIT: na  OBJECTIVE:  Note: Objective measures were completed at Evaluation unless otherwise noted.  DIAGNOSTIC FINDINGS: na  PATIENT SURVEYS:  Eval:  LEFS 41/80  02/18/24: LEFS 56/80= 70%  COGNITION: Overall cognitive status: Within functional limits for tasks assessed     SENSATION: WFL   MUSCLE LENGTH: Initial eval: Hamstrings: Right 40 deg; Left 45 deg Thomas test: Right pos; Left pos  02/18/24: Hamstrings: Right 50; Left 50  POSTURE: rounded shoulders and forward head  PALPATION: Palpable crepitus bilateral knees on seated flexion /  extension  CERVICAL ROM: Eval: Flexion: 40 deg Extension: 40 deg SB right : 25 deg SB left : 22 deg Rot right: 40 deg Rot left: 45 deg  02/18/24: Flexion: 50 deg Extension: 40 deg SB right : 30 deg SB left : 28 deg Rot right: 50 deg Rot left: 55 deg  UPPER EXTREMITY ROM:     WFL  UPPER EXTREMITY MMT:  02/18/24: Generally 4+/5 to 5/5 bilateral upper extremities  LOWER EXTREMITY ROM:  WFL  LOWER EXTREMITY MMT:   Initial eval: Bilateral hips generally 4-/5, bilateral knees generally 4/5, ankles 5/5  02/18/24:  Bilateral hips generally 4+/5, bilateral knees generally 4+/5, ankles 5/5  LOWER EXTREMITY SPECIAL TESTS:  Hip special tests: Portia Brittle (FABER) test: positive  and Thomas test: positive  Knee special tests: did not need this one.  FUNCTIONAL TESTS:  Eval: 5 times sit to stand: 16.49 sec Timed up and go (TUG): 12.18  1/202/2024: 3 minute walk  test:  529 ft with RPE of 2-3/10   02/18/24: 5 times sit to stand: 9.98 sec Timed up and go (TUG): 9.36 sec 3 min walk test: 584.9 feet   GAIT: Distance walked: 30 feet Assistive device utilized: None Level of assistance: Complete Independence Comments: antalgic start up then short step length with limited heel strike                                                                                                                                TODAY'S TREATMENT DATE: 04/07/2024 Nustep x 8 sec level 5 Seated clam with yellow loop x 20 Lateral band walks with blue loop x 3 laps at counter top in back Hook lying clam with yellow loop x 20 Side lying clam with no loop 2 x 10 each side Supine SLR (low range) 2 x 10 MELT technique with blue foam roller : side to side x 20, up/down x 20, figure 8's x 10 each side Seated shoulder rows with red tband (PT anchoring) x 20 Seated shoulder bilateral ER x 20 with red tband Seated shoulder horizontal abduction x 20 with red tband  DATE: 04/01/2024 Nustep x 8 sec level 5 Sit to  stand 2 x 5 Seated clam with yellow loop x 20 Seated LAQ 4 lbs 2 x 10 Seated march with 4 lbs 2 x 10 Seated hip ER with 4 lbs 2 x 10 3 D ball roll outs for lumbar stretching x 10 each direction Supine hamstring stretch x 5 hold 10 sec each Heel slides x 10 on each LE for knee and hip flexion using towel under heel Supine hip abduction 2 x 10 on each LE using towel   DATE: 03/23/2024 Nustep x 7 min 30 sec level 3  Leg Press 30 lbs x 20 Hip matrix 25 lbs 2 x 10 hip abduction and extension both sides Sit to stand with 2 lb dumbbells fwd press x 10 Sit to stand with 2 lb dumbbells overhead press x 10 Seated mini sit ups 2 x 10 with 2 x 2 lbs dumbbells  Seated in reclined position: hip to hip with 2 x 2 lb dumbbells Lateral band walks with blue loop 2 laps of apprx 15 feet in hallway with hands on wall Seated hip abduction with blue loop x 20 Seated LAQ 2 x 10 with 4 lb ankle weights Seated hamstring curls with red tband x 20 each LE  DATE: 12/05/23 Initial eval completed and initiated HEP    PATIENT EDUCATION:  Education details: Initiated HEP Person educated: Patient Education method: Programmer, multimedia, Facilities manager, Verbal cues, and Handouts Education comprehension: verbalized understanding, returned demonstration, and verbal cues required  HOME EXERCISE PROGRAM: Access Code: [email or web address] URL: [email or web address] Date: 12/09/2023 Prepared by: Robyne Christen  Exercises - Standing Quad Stretch with Table and Chair Support  - 1 x daily - 7 x weekly - 1 sets - 3 reps - 30 sec hold -  Seated Piriformis Stretch with Trunk Bend  - 1 x daily - 7 x weekly - 3 reps - 30 sec hold - Seated Piriformis Stretch  - 1 x daily - 7 x weekly - 2 reps - 20 sec hold - Seated Hamstring Stretch  - 1 x daily - 7 x weekly - 2 reps - 20 sec hold - Seated Long Arc Quad  - 1 x daily - 7 x weekly - 2 sets - 10 reps - Seated Pelvic Floor Contraction with Isometric Hip Adduction  - 1 x daily - 7 x  weekly - 2 sets - 10 reps - Seated Hip Abduction with Resistance  - 1 x daily - 7 x weekly - 2 sets - 10 reps  ASSESSMENT:  CLINICAL IMPRESSION: Haset is progressing appropriately.  She is progressing toward final goals.   She would benefit from continuing skilled PT to address goals below.     OBJECTIVE IMPAIRMENTS: Abnormal gait, decreased balance, decreased coordination, decreased mobility, difficulty walking, decreased ROM, decreased strength, increased fascial restrictions, increased muscle spasms, impaired flexibility, postural dysfunction, and pain.   ACTIVITY LIMITATIONS: carrying, lifting, bending, sitting, standing, squatting, sleeping, stairs, transfers, bed mobility, continence, bathing, toileting, dressing, hygiene/grooming, and caring for others  PARTICIPATION LIMITATIONS: meal prep, cleaning, laundry, driving, shopping, community activity, yard work, and church  PERSONAL FACTORS: Age, Fitness, Time since onset of injury/illness/exacerbation, and 3+ comorbidities: previous back surgery, prior hip surgery x 2, Htn, Osteopenia are also affecting patient's functional outcome.   REHAB POTENTIAL: Fair multiple former orthopedic issues and age  CLINICAL DECISION MAKING: Evolving/moderate complexity  EVALUATION COMPLEXITY: Moderate   GOALS: Goals reviewed with patient? Yes  SHORT TERM GOALS: Target date: 01/02/2024 ( C spine goal date 02/05/2024)   Pain report to be no greater than 4/10  Baseline: Goal status: MET for knees ( will continue to work on Cspine pain level)  2.  Patient will be independent with initial HEP for knees and Cspine Baseline:  Goal status: MET 01/21/24   LONG TERM GOALS: Target date: 01/30/2024 (C spine long term goal date 02/19/2024)   Patient to report pain no greater than 2/10 Baseline:  Goal status:  (knee goal met, will leave in place for c spine)  2.  Patient to be independent with advanced HEP  Baseline:  Goal status: (knee goal met, will  leave in place for c spine)  3.  Patient to be able to bend, stoop and squat with pain no greater than 2/10  Baseline:  Goal status: In progress (knees)  4.  Patient to be able to ascend and descend steps without pain or no greater than 2/10  Baseline:  Goal status: In progress (knees)  5.  Patient to be able to stand or walk for at least 15 min without leg pain  Baseline:  Goal status: In progress (knees)  6.  LEFS score to improve by 2-4 points Baseline:  Goal status: In progress  7. Patient to be able to sleep through the night   Baseline:  Goal status: in progress  8. Patient to demonstrate full Cspine ROM without pain  Baseline:  Goal status: In progress   PLAN:  PT FREQUENCY: 1-2x/week  PT DURATION: 8 weeks  PLANNED INTERVENTIONS: 97110-Therapeutic exercises, 97530- Therapeutic activity, [id number]- Neuromuscular re-education, 97535- Self Care, [id number]- Manual therapy, [id number]- Gait training, [phone]- Aquatic Therapy, 97014- Electrical stimulation (unattended), [id number]- Electrical stimulation (manual), [id number]- Vasopneumatic device, [id number]- Ultrasound, [id number]- Ionotophoresis 4mg /ml Dexamethasone , Patient/Family education, Balance training, Stair  training, Taping, Dry Needling, Joint mobilization, Spinal mobilization, Compression bandaging, Vestibular training, Visual/preceptual remediation/compensation, DME instructions, Cryotherapy, and Moist heat  PLAN FOR NEXT SESSION: UBE or Nustep, functional strengthening, core and  postural strengthening, avoid over extension C spine.      [facility]ridget[facility] Campus B. Jim Lundin, PT 04/07/24 6:50 PM [facility] [address], [address] [place], [place] [address] Phone # [phone] Fax [phone]

## 2024-04-08 DIAGNOSIS — M48061 Spinal stenosis, lumbar region without neurogenic claudication: Secondary | ICD-10-CM | POA: Diagnosis not present

## 2024-04-08 DIAGNOSIS — M545 Low back pain, unspecified: Secondary | ICD-10-CM | POA: Diagnosis not present

## 2024-04-14 ENCOUNTER — Ambulatory Visit

## 2024-04-14 DIAGNOSIS — M25552 Pain in left hip: Secondary | ICD-10-CM

## 2024-04-14 DIAGNOSIS — R293 Abnormal posture: Secondary | ICD-10-CM

## 2024-04-14 DIAGNOSIS — G8929 Other chronic pain: Secondary | ICD-10-CM

## 2024-04-14 DIAGNOSIS — M6281 Muscle weakness (generalized): Secondary | ICD-10-CM

## 2024-04-14 DIAGNOSIS — M542 Cervicalgia: Secondary | ICD-10-CM

## 2024-04-14 DIAGNOSIS — R262 Difficulty in walking, not elsewhere classified: Secondary | ICD-10-CM

## 2024-04-14 DIAGNOSIS — M25561 Pain in right knee: Secondary | ICD-10-CM | POA: Diagnosis not present

## 2024-04-14 DIAGNOSIS — M25551 Pain in right hip: Secondary | ICD-10-CM

## 2024-04-14 DIAGNOSIS — R252 Cramp and spasm: Secondary | ICD-10-CM

## 2024-04-14 NOTE — Therapy (Signed)
 OUTPATIENT PHYSICAL THERAPY TREATMENT NOTE PHYSICAL THERAPY DISCHARGE SUMMARY  Visits from Start of Care: 17  Current functional level related to goals / functional outcomes: See below   Remaining deficits: See below   Education / Equipment: See below   Patient agrees to discharge. Patient goals were met. Patient is being discharged due to meeting the stated rehab goals.    Patient Name: Debbie Houston MRN: [id number] DOB:Feb 03, 1941, 83 y.o., female Today's Date: 04/14/2024  END OF SESSION:  PT End of Session - 04/14/24 1412     Visit Number 17    Date for PT Re-Evaluation 04/14/24    Authorization Type Medicare    Progress Note Due on Visit 20    PT Start Time 1408    PT Stop Time 1447    PT Time Calculation (min) 39 min    Activity Tolerance Patient tolerated treatment well    Behavior During Therapy WFL for tasks assessed/performed              Past Medical History:  Diagnosis Date   Arthritis    Benign labile hypertension    Cancer (HCC)    skin cancer on face   Cellulitis    Colitis    DJD (degenerative joint disease)    Endometrial polyp    GERD (gastroesophageal reflux disease)    History of basal cell carcinoma excision    History of esophagitis    HOH (hard of hearing)    right ear better than left  -- wears no aides   Hyperlipidemia    Hypothyroidism    Neuromuscular disorder (HCC)    neuropathy in feet   Osteopenia    Prediabetes    S/P right hip fracture 08/17/2020   Sepsis (HCC) 2023   per patient   Spinal stenosis 2025   per patient   Vitamin D  deficiency    Wears glasses    Past Surgical History:  Procedure Laterality Date   BACK SURGERY  2020   BIOPSY  09/27/2023   Procedure: BIOPSY;  Surgeon: Ozell Blunt, MD;  Location: [facility] ENDOSCOPY;  Service: Gastroenterology;;   CATARACT EXTRACTION, BILATERAL Bilateral 2020   CONVERSION TO TOTAL HIP Right 01/22/2022   Procedure: CONVERSION TO TOTAL HIP;  Surgeon: Murleen Arms, MD;   Location: [facility] ORS;  Service: Orthopedics;  Laterality: Right;   DILATATION & CURETTAGE/HYSTEROSCOPY WITH MYOSURE N/A 10/27/2014   Procedure: DILATATION & CURETTAGE/HYSTEROSCOPY WITH MYOSURE;  Surgeon: Chandler Combs, MD;  Location: [facility] [place];  Service: Gynecology;  Laterality: N/A;   DILATION AND CURETTAGE OF UTERUS     FLEXIBLE SIGMOIDOSCOPY N/A 09/27/2023   Procedure: FLEXIBLE SIGMOIDOSCOPY;  Surgeon: Ozell Blunt, MD;  Location: [facility] ENDOSCOPY;  Service: Gastroenterology;  Laterality: N/A;   HIP SURGERY Right 04/2020   rod and screw in right hip    MOHS SURGERY  2005   LEFT NASAL BRIDGE FOR BASAL CELL   TONSILLECTOMY  age 24   Patient Active Problem List   Diagnosis Date Noted   Hyponatremia 09/26/2023   Cellulitis of left buttock 09/26/2023   Proctitis 09/25/2023   History of microscopic colitis 09/25/2023   GERD (gastroesophageal reflux disease) 09/25/2023   Sepsis secondary to UTI (HCC) 06/24/2022   Extensor tenosynovitis of right wrist 06/23/2022   Thrombocytopenia (HCC) 06/23/2022   Prediabetes 06/23/2022   Intertrochanteric fracture of right femur, closed, with nonunion, subsequent encounter 01/22/2022   History of ulcerative colitis 07/10/2021   Complete right bundle branch block (RBBB)  05/15/2021   Body mass index (BMI) of 19.0-19.9 in adult 05/12/2021   Bilateral nephrolithiasis 09/21/2020   Senile purpura (HCC) 08/17/2020   Bilateral temporomandibular joint pain 06/15/2020   Spondylolisthesis at L4-L5 level 11/25/2018   Essential hypertension    Hyperlipidemia    Hypothyroidism    GERD    Asthma    DJD (degenerative joint disease)    Vitamin D  deficiency    Osteoporosis with current pathological fracture    Allergy     PCP:   Vangie Genet, MD    REFERRING PROVIDER: Shermon Divine, MD  REFERRING DIAG:  Diagnosis  M17.0 (ICD-10-CM) - Bilateral primary osteoarthritis of knee  R29.898 (ICD-10-CM) - Hip weakness  M54.50 (ICD-10-CM) - Low  back pain    THERAPY DIAG:  Pain in left hip  Chronic pain of both knees  Pain in right hip  Muscle weakness (generalized)  Difficulty in walking, not elsewhere classified  Cervicalgia  Cramp and spasm  Abnormal posture  Rationale for Evaluation and Treatment: Rehabilitation  ONSET DATE: 12/03/2023  SUBJECTIVE:   SUBJECTIVE STATEMENT: "I feel better than when I started and I have learned a lot.  Now it's just about me following through."  PERTINENT HISTORY: Right hip fx from fall 2 years ago, initial ORIF, then subsequent THA.   PAIN:  03/17/24 Are you having pain? Yes: NPRS scale: 2/10 back, legs  Pain location: knees, hips, low back  Pain description: aching Aggravating factors: prolonged sitting or standing Relieving factors: Tylenol  arthritis  PRECAUTIONS: None  RED FLAGS: Bowel or bladder incontinence: Yes: age related   WEIGHT BEARING RESTRICTIONS: No  FALLS:  Has patient fallen in last 6 months? No  LIVING ENVIRONMENT: Lives with: lives with their family and lives with their spouse Lives in: House/apartment Stairs: Yes: Internal: 12 steps; can reach both and External: 4 steps; can reach both Has following equipment at home: None  OCCUPATION: retired  PLOF: Independent, Independent with basic ADLs, Independent with household mobility without device, Independent with community mobility without device, Independent with homemaking with ambulation, Independent with gait, Independent with transfers, Vocation/Vocational requirements: retired, and Leisure: read, watch TV, go out with friends and go to the beach  PATIENT GOALS: to be able to walk and do routine daily activities with manageable pain/ to be able to turn my head and sleep without pain in my neck.   NEXT MD VISIT: na  OBJECTIVE:  Note: Objective measures were completed at Evaluation unless otherwise noted.  DIAGNOSTIC FINDINGS: na  PATIENT SURVEYS:  Eval:  LEFS 41/80  02/18/24: LEFS  56/80= 70%  COGNITION: Overall cognitive status: Within functional limits for tasks assessed     SENSATION: WFL   MUSCLE LENGTH: Initial eval: Hamstrings: Right 40 deg; Left 45 deg Thomas test: Right pos; Left pos  02/18/24: Hamstrings: Right 50; Left 50  POSTURE: rounded shoulders and forward head  PALPATION: Palpable crepitus bilateral knees on seated flexion / extension  CERVICAL ROM: Eval: Flexion: 40 deg Extension: 40 deg SB right : 25 deg SB left : 22 deg Rot right: 40 deg Rot left: 45 deg  02/18/24: Flexion: 50 deg Extension: 40 deg SB right : 30 deg SB left : 28 deg Rot right: 50 deg Rot left: 55 deg  04/14/24: Flexion: 50 deg Extension: 50 deg SB right : 35 deg SB left : 35 deg Rot right: 55 deg Rot left: 65 deg  UPPER EXTREMITY ROM:     WFL  UPPER EXTREMITY MMT:  02/18/24: Generally 4+/5 to 5/5 bilateral upper extremities 04/14/24: Generally 4+/5 to 5/5 bilateral upper extremities  LOWER EXTREMITY ROM:  WFL  LOWER EXTREMITY MMT:   Initial eval: Bilateral hips generally 4-/5, bilateral knees generally 4/5, ankles 5/5  02/18/24:  Bilateral hips generally 4+/5, bilateral knees generally 4+/5, ankles 5/5  04/14/24: Bilateral hips generally 4+/5, bilateral knees generally 4+/5, ankles 5/5  LOWER EXTREMITY SPECIAL TESTS:  Hip special tests: Portia Brittle (FABER) test: positive  and Thomas test: positive  Knee special tests: did not need this one.  FUNCTIONAL TESTS:  Eval: 5 times sit to stand: 16.49 sec Timed up and go (TUG): 12.18  1/202/2024: 3 minute walk test:  529 ft with RPE of 2-3/10   02/18/24: 5 times sit to stand: 9.98 sec Timed up and go (TUG): 9.36 sec 3 min walk test: 584.9 feet   04/14/24: 5 times sit to stand: 9.08 sec Timed up and go (TUG): 9.41 sec  GAIT: Distance walked: 30 feet Assistive device utilized: None Level of assistance: Complete Independence Comments: antalgic start up then short step length with limited heel  strike                                                                                                                                TODAY'S TREATMENT DATE: 04/14/2024 Nustep x 8 min level 5 Seated clam with yellow loop x 20 Hook lying clam with yellow loop x 20 Side lying clam with blue loop 2 x 10 each side Bridging x 20 Supine SLR (low range) 2 x 10 Standing hip matrix: hip abduction, flexion and extension with 25 lbs both 2 x 10 Lateral band walks with blue loop x 3 laps at counter top in back DC assessment completed and DC plan provided  DATE: 04/07/2024 Nustep x 8 min level 5 Seated clam with yellow loop x 20 Lateral band walks with blue loop x 3 laps at counter top in back Hook lying clam with yellow loop x 20 Side lying clam with no loop 2 x 10 each side Supine SLR (low range) 2 x 10 MELT technique with blue foam roller : side to side x 20, up/down x 20, figure 8's x 10 each side Seated shoulder rows with red tband (PT anchoring) x 20 Seated shoulder bilateral ER x 20 with red tband Seated shoulder horizontal abduction x 20 with red tband  DATE: 04/01/2024 Nustep x 8 min level 5 Sit to stand 2 x 5 Seated clam with yellow loop x 20 Seated LAQ 4 lbs 2 x 10 Seated march with 4 lbs 2 x 10 Seated hip ER with 4 lbs 2 x 10 3 D ball roll outs for lumbar stretching x 10 each direction Supine hamstring stretch x 5 hold 10 sec each Heel slides x 10 on each LE for knee and hip flexion using towel under heel Supine hip abduction 2 x 10 on each LE using towel  DATE: 03/23/2024 Nustep x 7 min 30 sec level 3  Leg Press 30 lbs x 20 Hip matrix 25 lbs 2 x 10 hip abduction and extension both sides Sit to stand with 2 lb dumbbells fwd press x 10 Sit to stand with 2 lb dumbbells overhead press x 10 Seated mini sit ups 2 x 10 with 2 x 2 lbs dumbbells  Seated in reclined position: hip to hip with 2 x 2 lb dumbbells Lateral band walks with blue loop 2 laps of apprx 15 feet in hallway with  hands on wall Seated hip abduction with blue loop x 20 Seated LAQ 2 x 10 with 4 lb ankle weights Seated hamstring curls with red tband x 20 each LE  DATE: 12/05/23 Initial eval completed and initiated HEP    PATIENT EDUCATION:  Education details: Initiated HEP Person educated: Patient Education method: Programmer, multimedia, Facilities manager, Verbal cues, and Handouts Education comprehension: verbalized understanding, returned demonstration, and verbal cues required  HOME EXERCISE PROGRAM: Access Code: [email or web address] URL: [email or web address] Date: 12/09/2023 Prepared by: Chaneta Comer Menke  Exercises - Standing Quad Stretch with Table and Chair Support  - 1 x daily - 7 x weekly - 1 sets - 3 reps - 30 sec hold - Seated Piriformis Stretch with Trunk Bend  - 1 x daily - 7 x weekly - 3 reps - 30 sec hold - Seated Piriformis Stretch  - 1 x daily - 7 x weekly - 2 reps - 20 sec hold - Seated Hamstring Stretch  - 1 x daily - 7 x weekly - 2 reps - 20 sec hold - Seated Long Arc Quad  - 1 x daily - 7 x weekly - 2 sets - 10 reps - Seated Pelvic Floor Contraction with Isometric Hip Adduction  - 1 x daily - 7 x weekly - 2 sets - 10 reps - Seated Hip Abduction with Resistance  - 1 x daily - 7 x weekly - 2 sets - 10 reps  ASSESSMENT:  CLINICAL IMPRESSION: Akilah has met most of her goals.  She is compliant and independent with her HEP.  She has several areas of end stage OA especially in her neck but is managing this well with the exercises we have provided.  All objective findings have improved.  She understands DC plan well and should continue to do well. We will DC at this time.     OBJECTIVE IMPAIRMENTS: Abnormal gait, decreased balance, decreased coordination, decreased mobility, difficulty walking, decreased ROM, decreased strength, increased fascial restrictions, increased muscle spasms, impaired flexibility, postural dysfunction, and pain.   ACTIVITY LIMITATIONS: carrying, lifting, bending,  sitting, standing, squatting, sleeping, stairs, transfers, bed mobility, continence, bathing, toileting, dressing, hygiene/grooming, and caring for others  PARTICIPATION LIMITATIONS: meal prep, cleaning, laundry, driving, shopping, community activity, yard work, and church  PERSONAL FACTORS: Age, Fitness, Time since onset of injury/illness/exacerbation, and 3+ comorbidities: previous back surgery, prior hip surgery x 2, Htn, Osteopenia are also affecting patient's functional outcome.   REHAB POTENTIAL: Fair multiple former orthopedic issues and age  CLINICAL DECISION MAKING: Evolving/moderate complexity  EVALUATION COMPLEXITY: Moderate   GOALS: Goals reviewed with patient? Yes  SHORT TERM GOALS: Target date: 01/02/2024 ( C spine goal date 02/05/2024)   Pain report to be no greater than 4/10  Baseline: Goal status: MET for knees ( will continue to work on Cspine pain level)  2.  Patient will be independent with initial HEP for knees and Cspine Baseline:  Goal status: MET 01/21/24   LONG  TERM GOALS: Target date: 01/30/2024 (C spine long term goal date 02/19/2024)   Patient to report pain no greater than 2/10 Baseline:  Goal status:  MET 04/14/24  2.  Patient to be independent with advanced HEP  Baseline:  Goal status: MET 04/14/24  3.  Patient to be able to bend, stoop and squat with pain no greater than 2/10  Baseline:  Goal status: MET 04/14/24  4.  Patient to be able to ascend and descend steps without pain or no greater than 2/10  Baseline:  Goal status: Most days, occasional "bad days" Met 527/25  5.  Patient to be able to stand or walk for at least 15 min without leg pain  Baseline:  Goal status: MET 04/14/24  6.  LEFS score to improve by 2-4 points Baseline:  Goal status: did not re-test on DC  7. Patient to be able to sleep through the night   Baseline:  Goal status: MET (just wakes with significant stiffness)  8. Patient to demonstrate full Cspine ROM without  pain  Baseline:  Goal status: Partially met, full ROM but with pain   PLAN:  PT FREQUENCY: 1-2x/week  PT DURATION: 8 weeks  PLANNED INTERVENTIONS: 97110-Therapeutic exercises, 97530- Therapeutic activity, [id number]- Neuromuscular re-education, 97535- Self Care, 16109- Manual therapy, [id number]- Gait training, [phone]- Aquatic Therapy, 97014- Electrical stimulation (unattended), [id number]- Electrical stimulation (manual), 97016- Vasopneumatic device, [id number]- Ultrasound, [id number]- Ionotophoresis 4mg /ml Dexamethasone , Patient/Family education, Balance training, Stair training, Taping, Dry Needling, Joint mobilization, Spinal mobilization, Compression bandaging, Vestibular training, Visual/preceptual remediation/compensation, DME instructions, Cryotherapy, and Moist heat  PLAN FOR NEXT SESSION: DC      Joannah Gitlin B. Dannell Gortney, PT 04/14/24 5:25 PM [facility] [address], [address] [place], [place] [address] Phone # [phone] Fax [phone]

## 2024-04-15 DIAGNOSIS — R42 Dizziness and giddiness: Secondary | ICD-10-CM | POA: Diagnosis not present

## 2024-04-15 DIAGNOSIS — E871 Hypo-osmolality and hyponatremia: Secondary | ICD-10-CM | POA: Diagnosis not present

## 2024-04-21 ENCOUNTER — Ambulatory Visit: Payer: Medicare Other | Admitting: Neurology

## 2024-04-28 ENCOUNTER — Ambulatory Visit: Payer: Self-pay | Admitting: Cardiology

## 2024-04-30 DIAGNOSIS — K5100 Ulcerative (chronic) pancolitis without complications: Principal | ICD-10-CM

## 2024-04-30 DIAGNOSIS — R42 Dizziness and giddiness: Secondary | ICD-10-CM | POA: Diagnosis not present

## 2024-04-30 MED ORDER — VELSIPITY 2 MG TABLET
ORAL_TABLET | Freq: Every day | ORAL | 0 refills | 30.00000 days
Start: 2024-04-30 — End: ?

## 2024-05-01 DIAGNOSIS — L03115 Cellulitis of right lower limb: Secondary | ICD-10-CM | POA: Diagnosis not present

## 2024-05-01 MED ORDER — VELSIPITY 2 MG TABLET
ORAL_TABLET | Freq: Every day | ORAL | 0 refills | 30.00000 days | Status: CP
Start: 2024-05-01 — End: ?

## 2024-05-01 NOTE — Unmapped (Signed)
 Patient is requesting the following refill  Requested Prescriptions     Pending Prescriptions Disp Refills    etrasimod (VELSIPITY) 2 mg Tab 30 tablet 0     Sig: Take 1 tablet (2 mg) by mouth in the morning.       Recent Visits  Date Type Provider Dept   12/10/23 Office Visit Long, Gerlene Redbird, MD [facility] Medicine [facility]   10/21/23 Office Visit Stem, Dorn Sharper, MD [facility] Multispecialty Surgery [facility] Surgery [facility]   10/07/23 Office Visit Stem, Dorn Sharper, MD [facility] Multispecialty Surgery [facility] Surgery [facility]   09/30/23 Office Visit Long, Gerlene Redbird, MD [facility] Medicine [place]   07/29/23 Telemedicine Long, Gerlene Redbird, MD [facility] Medicine [place]   Showing recent visits within past 365 days and meeting all other requirements  Future Appointments  Date Type Provider Dept   05/05/24 Appointment Long, Gerlene Redbird, MD [facility] Medicine [facility]   Showing future appointments within next 365 days and meeting all other requirements           Encounter for refill request:    Colonoscopy:   Appointments which have been scheduled for you      May 05, 2024 1:30 PM  (Arrive by 1:15 PM)  RETURN IRRITABLE BOWEL DISEASE with Gerlene Redbird Law, MD  [facility] GI MEDICINE [place] [facility] REGION) [address]  [place] [facility] 1 through 4  [place] [id number]  [phone]             Lab Results   Component Value Date    WBC 5.7 03/12/2024    RBC 3.97 03/12/2024    HGB 12.9 03/12/2024    HCT 39.2 03/12/2024    PLT 217 03/12/2024    ALT 20 03/12/2024    AST 27 03/12/2024    ALKPHOS 57 03/12/2024    CRP <4.0 12/10/2023    CREATININE 0.72 12/10/2023       refills authorized x  1. Will plan for repeat labs with June 17 appt.

## 2024-05-05 DIAGNOSIS — L0390 Cellulitis, unspecified: Secondary | ICD-10-CM | POA: Diagnosis not present

## 2024-05-05 DIAGNOSIS — L02415 Cutaneous abscess of right lower limb: Secondary | ICD-10-CM | POA: Diagnosis not present

## 2024-05-05 DIAGNOSIS — L03115 Cellulitis of right lower limb: Secondary | ICD-10-CM | POA: Diagnosis not present

## 2024-05-06 DIAGNOSIS — N1831 Chronic kidney disease, stage 3a: Secondary | ICD-10-CM | POA: Diagnosis not present

## 2024-05-06 DIAGNOSIS — I10 Essential (primary) hypertension: Secondary | ICD-10-CM | POA: Diagnosis not present

## 2024-05-06 NOTE — Unmapped (Signed)
 I spoke with Traci Bennett in regards to the next shipment of Velsipity and she declined. Traci Bennett states that the provider has changed to dosage to one tablet every other day and she has a full bottle on hand. She has requested that I give her a call back in 3 weeks. I will adjust the refill call.

## 2024-05-11 DIAGNOSIS — K52839 Microscopic colitis, unspecified: Principal | ICD-10-CM

## 2024-05-11 MED ORDER — VANCOMYCIN 125 MG CAPSULE
ORAL_CAPSULE | Freq: Four times a day (QID) | ORAL | 2 refills | 0.00000 days
Start: 2024-05-11 — End: ?

## 2024-05-12 DIAGNOSIS — E039 Hypothyroidism, unspecified: Secondary | ICD-10-CM | POA: Diagnosis not present

## 2024-05-12 DIAGNOSIS — L0390 Cellulitis, unspecified: Secondary | ICD-10-CM | POA: Diagnosis not present

## 2024-05-12 MED ORDER — VANCOMYCIN 125 MG CAPSULE
ORAL_CAPSULE | Freq: Two times a day (BID) | ORAL | 0 refills | 60.00000 days | Status: CP
Start: 2024-05-12 — End: ?

## 2024-05-12 NOTE — Unmapped (Signed)
 Patient is requesting the following refill  Requested Prescriptions     Pending Prescriptions Disp Refills    vancomycin  (VANCOCIN ) 125 MG capsule [Pharmacy Med Name: VANCOMYCIN  125MG  CAPSULES] 120 capsule 2     Sig: TAKE ONE CAPSULE BY MOUTH FOUR TIMES DAILY.       Recent Visits  Date Type Provider Dept   12/10/23 Office Visit Long, Traci Redbird, MD [facility] Medicine [facility]   10/21/23 Office Visit Stem, Dorn Sharper, MD [facility] Multispecialty Surgery [facility] Surgery [facility]   10/07/23 Office Visit Stem, Dorn Sharper, MD [facility] Multispecialty Surgery [facility] [place]   09/30/23 Office Visit Long, Traci Redbird, MD [facility] Medicine [place]   07/29/23 Telemedicine Long, Traci Redbird, MD [facility] Medicine [place]   Showing recent visits within past 365 days and meeting all other requirements  Future Appointments  Date Type Provider Dept   06/30/24 Appointment Long, Traci Redbird, MD [facility] Medicine [facility]   Showing future appointments within next 365 days and meeting all other requirements           Encounter for refill request:    Colonoscopy:   Appointments which have been scheduled for you      Jun 30, 2024 12:00 PM  (Arrive by 11:45 AM)  RETURN IBD with Traci Bennett Law, MD  [facility] GI MEDICINE [place] [facility] REGION) [address]  [facility] 1 through 4  [place] [id number]  [phone]             Lab Results   Component Value Date    WBC 5.7 03/12/2024    RBC 3.97 03/12/2024    HGB 12.9 03/12/2024    HCT 39.2 03/12/2024    PLT 217 03/12/2024    ALT 20 03/12/2024    AST 27 03/12/2024    ALKPHOS 57 03/12/2024    CRP <4.0 12/10/2023    CREATININE 0.72 12/10/2023       refills authorized x

## 2024-05-13 DIAGNOSIS — M47812 Spondylosis without myelopathy or radiculopathy, cervical region: Secondary | ICD-10-CM | POA: Diagnosis not present

## 2024-05-14 DIAGNOSIS — K52839 Microscopic colitis, unspecified: Principal | ICD-10-CM

## 2024-05-14 MED ORDER — PANTOPRAZOLE 40 MG TABLET,DELAYED RELEASE
ORAL_TABLET | ORAL | 0 refills | 0.00000 days | Status: CP
Start: 2024-05-14 — End: ?

## 2024-05-14 NOTE — Unmapped (Signed)
 Patient is requesting the following refill  Requested Prescriptions     Pending Prescriptions Disp Refills    pantoprazole  (PROTONIX ) 40 MG tablet 180 tablet 3     Sig: TAKE 1 TABLET BY MOUTH TWICE DAILY BEFORE BREAKFAST AND BEFORE DINNER       Recent Visits  Date Type Provider Dept   12/10/23 Office Visit Long, Gerlene Redbird, MD [facility] Medicine [place]   09/30/23 Office Visit Long, Gerlene Redbird, MD [facility]   07/29/23 Telemedicine Long, Gerlene Redbird, MD [facility] Medicine [facility]   Showing recent visits within past 365 days and meeting all other requirements  Future Appointments  Date Type Provider Dept   06/30/24 Appointment Long, Gerlene Redbird, MD [facility] Medicine [facility]   Showing future appointments within next 365 days and meeting all other requirements           Encounter for refill request:    Colonoscopy:   Appointments which have been scheduled for you      Jun 30, 2024 12:00 PM  (Arrive by 11:45 AM)  RETURN IBD with Gerlene Redbird Law, MD  [facility] GI MEDICINE [place] [facility] REGION) [address]  [facility] 1 through [address]  [place] [id number]  [phone]             Lab Results   Component Value Date    WBC 5.7 03/12/2024    RBC 3.97 03/12/2024    HGB 12.9 03/12/2024    HCT 39.2 03/12/2024    PLT 217 03/12/2024    ALT 20 03/12/2024    AST 27 03/12/2024    ALKPHOS 57 03/12/2024    CRP <4.0 12/10/2023    CREATININE 0.72 12/10/2023       refills authorized x  

## 2024-05-18 DIAGNOSIS — K52839 Microscopic colitis, unspecified: Principal | ICD-10-CM

## 2024-05-18 MED ORDER — COLESTIPOL 1 GRAM TABLET
ORAL_TABLET | ORAL | 1 refills | 0.00000 days | Status: CP
Start: 2024-05-18 — End: ?

## 2024-05-19 DIAGNOSIS — L03115 Cellulitis of right lower limb: Secondary | ICD-10-CM | POA: Diagnosis not present

## 2024-05-27 DIAGNOSIS — S81801D Unspecified open wound, right lower leg, subsequent encounter: Secondary | ICD-10-CM | POA: Diagnosis not present

## 2024-05-27 DIAGNOSIS — R0982 Postnasal drip: Secondary | ICD-10-CM | POA: Diagnosis not present

## 2024-06-02 DIAGNOSIS — K5100 Ulcerative (chronic) pancolitis without complications: Principal | ICD-10-CM

## 2024-06-02 MED ORDER — VELSIPITY 2 MG TABLET
ORAL_TABLET | Freq: Every day | ORAL | 0 refills | 30.00000 days
Start: 2024-06-02 — End: ?

## 2024-06-02 NOTE — Unmapped (Signed)
 [facility] Delivery Pharm[facility] Refill Coordination Note    Specialty Medication(s) to be Shipped:   Inflammatory Disorders: Velsipity     Other medication(s) to be shipped: No additional medications requested for fill at this time     Janiece Scovill, DOB: 03-10-1941  Phone: [phone] (home)       All above HIPAA information was verified with patient.     Was a Nurse, learning disability used for this call? No    Completed refill call assessment today to schedule patient's medication shipment from the [facility] Delivery [facility]  [phone]).  All relevant notes have been reviewed.     Specialty medication(s) and dose(s) confirmed: pt reported she is now taking it every other day but may change back to everyday after her upcoming appnt.   Changes to medications: Annisa reports no changes at this time.  Changes to insurance: No  New side effects reported not previously addressed with a pharmacist or physician: None reported  Questions for the pharmacist: No    Confirmed patient received a Conservation officer, historic buildings and a Surveyor, mining with first shipment. The patient will receive a drug information handout for each medication shipped and additional FDA Medication Guides as required.       DISEASE/MEDICATION-SPECIFIC INFORMATION        N/A    SPECIALTY MEDICATION ADHERENCE     Medication Adherence    Patient reported X missed doses in the last month: 0  Specialty Medication: VELSIPITY  2 mg Tab  Patient is on additional specialty medications: No              Were doses missed due to medication being on hold? No      VELSIPITY  2 mg Tab (etrasimod): 15 days of medicine on hand       REFERRAL TO PHARMACIST     Referral to the pharmacist: Not needed      [facility]     Shipping address confirmed in Epic.     Cost and Payment: Patient has a $0 copay, payment information is not required.    Delivery Scheduled: Yes, Expected medication delivery date: 7.25.25. Pt aware that a refill request was sent to provider requesting the new dosing of every other day.    Medication will be delivered via UPS to the prescription address in Epic WAM.    Doyal Hurst   [facility]  Specialty Technician

## 2024-06-03 MED ORDER — VELSIPITY 2 MG TABLET
ORAL_TABLET | ORAL | 0 refills | 0.00000 days | Status: CP
Start: 2024-06-03 — End: ?
  Filled 2024-06-11: qty 30, 60d supply, fill #0

## 2024-06-03 NOTE — Unmapped (Signed)
 Encounter for refill request:  Last clinic visit: 12/10/2023  Colonoscopy:   Appointments which have been scheduled for you      Jun 30, 2024 12:00 PM  (Arrive by 11:45 AM)  RETURN IBD with Gerlene Raeford Law, MD  [facility] GI MEDICINE [place] [facility] REGION) [address]  [facility] 1 through 4  [place] [id number]  [phone]             Lab Results   Component Value Date    WBC 5.7 03/12/2024    RBC 3.97 03/12/2024    HGB 12.9 03/12/2024    HCT 39.2 03/12/2024    PLT 217 03/12/2024    ALT 20 03/12/2024    AST 27 03/12/2024    ALKPHOS 57 03/12/2024    CRP <4.0 12/10/2023    CREATININE 0.72 12/10/2023       Velsipity  refills authorized x 2 month.

## 2024-06-04 DIAGNOSIS — N1831 Chronic kidney disease, stage 3a: Secondary | ICD-10-CM | POA: Diagnosis not present

## 2024-06-04 DIAGNOSIS — I10 Essential (primary) hypertension: Secondary | ICD-10-CM | POA: Diagnosis not present

## 2024-06-16 DIAGNOSIS — S81801D Unspecified open wound, right lower leg, subsequent encounter: Secondary | ICD-10-CM | POA: Diagnosis not present

## 2024-06-16 DIAGNOSIS — R5382 Chronic fatigue, unspecified: Secondary | ICD-10-CM | POA: Diagnosis not present

## 2024-06-18 DIAGNOSIS — E782 Mixed hyperlipidemia: Secondary | ICD-10-CM | POA: Diagnosis not present

## 2024-06-18 DIAGNOSIS — N1831 Chronic kidney disease, stage 3a: Secondary | ICD-10-CM | POA: Diagnosis not present

## 2024-06-18 DIAGNOSIS — I10 Essential (primary) hypertension: Secondary | ICD-10-CM | POA: Diagnosis not present

## 2024-06-18 DIAGNOSIS — E039 Hypothyroidism, unspecified: Secondary | ICD-10-CM | POA: Diagnosis not present

## 2024-06-22 ENCOUNTER — Other Ambulatory Visit: Payer: Self-pay | Admitting: Nurse Practitioner

## 2024-06-30 ENCOUNTER — Encounter
Admit: 2024-06-30 | Discharge: 2024-07-01 | Payer: Medicare (Managed Care) | Attending: Internal Medicine | Primary: Internal Medicine

## 2024-06-30 ENCOUNTER — Ambulatory Visit
Admit: 2024-06-30 | Discharge: 2024-07-01 | Payer: Medicare (Managed Care) | Attending: Internal Medicine | Primary: Internal Medicine

## 2024-06-30 DIAGNOSIS — K9089 Other intestinal malabsorption: Principal | ICD-10-CM

## 2024-06-30 DIAGNOSIS — K529 Noninfective gastroenteritis and colitis, unspecified: Principal | ICD-10-CM

## 2024-06-30 DIAGNOSIS — D84821 Immunocompromised state due to drug therapy (HHS-HCC): Principal | ICD-10-CM

## 2024-06-30 DIAGNOSIS — Z0000 Encounter for general adult medical examination without abnormal findings: Principal | ICD-10-CM

## 2024-06-30 DIAGNOSIS — Z79899 Other long term (current) drug therapy: Principal | ICD-10-CM

## 2024-06-30 DIAGNOSIS — K219 Gastro-esophageal reflux disease without esophagitis: Principal | ICD-10-CM

## 2024-06-30 DIAGNOSIS — L0390 Cellulitis, unspecified: Principal | ICD-10-CM

## 2024-06-30 DIAGNOSIS — E611 Iron deficiency: Principal | ICD-10-CM

## 2024-06-30 DIAGNOSIS — I10 Essential (primary) hypertension: Principal | ICD-10-CM

## 2024-06-30 DIAGNOSIS — Z7982 Long term (current) use of aspirin: Secondary | ICD-10-CM | POA: Diagnosis not present

## 2024-06-30 DIAGNOSIS — K523 Indeterminate colitis: Secondary | ICD-10-CM | POA: Diagnosis not present

## 2024-06-30 DIAGNOSIS — Z79890 Hormone replacement therapy (postmenopausal): Secondary | ICD-10-CM | POA: Diagnosis not present

## 2024-06-30 DIAGNOSIS — K52831 Collagenous colitis: Secondary | ICD-10-CM | POA: Diagnosis not present

## 2024-06-30 DIAGNOSIS — E7800 Pure hypercholesterolemia, unspecified: Secondary | ICD-10-CM | POA: Diagnosis not present

## 2024-06-30 DIAGNOSIS — D849 Immunodeficiency, unspecified: Secondary | ICD-10-CM | POA: Diagnosis not present

## 2024-06-30 DIAGNOSIS — E079 Disorder of thyroid, unspecified: Secondary | ICD-10-CM | POA: Diagnosis not present

## 2024-06-30 DIAGNOSIS — Z87891 Personal history of nicotine dependence: Secondary | ICD-10-CM | POA: Diagnosis not present

## 2024-06-30 LAB — CBC W/ AUTO DIFF
BASOPHILS ABSOLUTE COUNT: 0 10*9/L (ref 0.0–0.1)
BASOPHILS RELATIVE PERCENT: 0.7 %
EOSINOPHILS ABSOLUTE COUNT: 0 10*9/L (ref 0.0–0.5)
EOSINOPHILS RELATIVE PERCENT: 0.9 %
HEMATOCRIT: 39.5 % (ref 34.0–44.0)
HEMOGLOBIN: 13.4 g/dL (ref 11.3–14.9)
LYMPHOCYTES ABSOLUTE COUNT: 0.4 10*9/L — ABNORMAL LOW (ref 1.1–3.6)
LYMPHOCYTES RELATIVE PERCENT: 7.2 %
MEAN CORPUSCULAR HEMOGLOBIN CONC: 33.9 g/dL (ref 32.0–36.0)
MEAN CORPUSCULAR HEMOGLOBIN: 32 pg (ref 25.9–32.4)
MEAN CORPUSCULAR VOLUME: 94.3 fL (ref 77.6–95.7)
MEAN PLATELET VOLUME: 8 fL (ref 6.8–10.7)
MONOCYTES ABSOLUTE COUNT: 0.5 10*9/L (ref 0.3–0.8)
MONOCYTES RELATIVE PERCENT: 9.5 %
NEUTROPHILS ABSOLUTE COUNT: 4.3 10*9/L (ref 1.8–7.8)
NEUTROPHILS RELATIVE PERCENT: 81.7 %
PLATELET COUNT: 219 10*9/L (ref 150–450)
RED BLOOD CELL COUNT: 4.19 10*12/L (ref 3.95–5.13)
RED CELL DISTRIBUTION WIDTH: 14.8 % (ref 12.2–15.2)
WBC ADJUSTED: 5.3 10*9/L (ref 3.6–11.2)

## 2024-06-30 LAB — COMPREHENSIVE METABOLIC PANEL
ALBUMIN: 4.3 g/dL (ref 3.4–5.0)
ALKALINE PHOSPHATASE: 45 U/L — ABNORMAL LOW (ref 46–116)
ALT (SGPT): 23 U/L (ref 10–49)
ANION GAP: 8 mmol/L (ref 5–14)
AST (SGOT): 30 U/L (ref <=34)
BILIRUBIN TOTAL: 0.7 mg/dL (ref 0.3–1.2)
BLOOD UREA NITROGEN: 9 mg/dL (ref 9–23)
BUN / CREAT RATIO: 13
CALCIUM: 9.7 mg/dL (ref 8.7–10.4)
CHLORIDE: 99 mmol/L (ref 98–107)
CO2: 25.7 mmol/L (ref 20.0–31.0)
CREATININE: 0.7 mg/dL (ref 0.55–1.02)
EGFR CKD-EPI (2021) FEMALE: 86 mL/min/1.73m2 (ref >=60)
GLUCOSE RANDOM: 105 mg/dL (ref 70–179)
POTASSIUM: 4.4 mmol/L (ref 3.4–4.8)
PROTEIN TOTAL: 6.9 g/dL (ref 5.7–8.2)
SODIUM: 133 mmol/L — ABNORMAL LOW (ref 135–145)

## 2024-06-30 LAB — VITAMIN B12: VITAMIN B-12: 1996 pg/mL — ABNORMAL HIGH (ref 211–911)

## 2024-06-30 LAB — FERRITIN: FERRITIN: 25.4 ng/mL (ref 7.3–270.7)

## 2024-06-30 LAB — FOLATE: FOLATE: >24 ng/mL (ref >=5.4)

## 2024-06-30 LAB — C-REACTIVE PROTEIN: C-REACTIVE PROTEIN: <5 mg/L (ref <=10.0)

## 2024-06-30 LAB — IRON & TIBC
IRON SATURATION: 25 % (ref 20–55)
IRON: 91 ug/dL (ref 50–170)
TOTAL IRON BINDING CAPACITY: 367 ug/dL (ref 250–425)

## 2024-06-30 NOTE — Unmapped (Signed)
 [facility]  CONSULT NOTE - INFLAMMATORY BOWEL DISEASE  06/30/2024    Demographics:  Traci Bennett is a 83 y.o. year old female seen for colitis  Referring physician:   Dr. 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 She has been on a number of therapies including steroids/budesonide (prior hip fracture), vedolizumab (no response), infliximab (response, but developed infectious complication of urosepsis), mesalamine , and most recently stelara  without benefit. Her symptoms are predominantly diarrhea, incontinence. She also has a history of c diff infection and has been on vancomycin  taper in the past (and uses this with need for other antibiotics). Most recently had perirectal abscess that was drained, not thought to have a fistula in that area and no further treatment needed. she had an MRI of the pelvis that showed nonspecific edema and dermal thickening noted along the medial left gluteal cleft with a small tiny rim-enhancing fluid collection measuring up to 1.2 cm in greatest dimension reflecting a small developing abscess/fistula tract. She has completed a 10 day course of Linezolid and Augmentin and has completely healed per follow up with Dr. Fenton 10/2023.    Interval history:    Taking velsipity  2mg  every other day decreased from daily dosing for rising BP (though still at goal for her age). She had a recent diagnosis of cellulitis 2 months ago for which she was treated with antibiotics outpatient and it has healed.     Was told that she has cervical spine stenosis and is going to get an injection from a neurosurgeon.     Denies diarrhea. Is currently only have 1-2 formed non-bloody bowel movements per day on average. She denies daily/constant abdominal pain. She does have heartburn symptoms that are controlled with BID pantoprazole . No dysphagia or odynophagia. No incontinence.     She continues Colestid  and Imodium  before bedtime at this point to prevent nocturnal awakenings.       Review of Systems: positive as per HPI   Otherwise, the balance of 10 systems is negative.    Brief colitis Disease Course:      Diagnosed years' ago with microscopic colitis, chronic entocort use with suboptimal control of diarrhea, osteopenia and fracture after a fall. Trial of entyvio wtihout clinical response. Colonoscopy with chronic colitis changes, thickening on CT, and elevated fecal calpro (387). Diagnosis changed to indeterminate colitis, initiation of remicade monotherapy. Subsequent infectious complications with urosepsis. Remicade discontinued and maintained on mesalamine , with flare was initiated on stelara . Treated for concomitant c diff. Initial response, then loss of response. Perirectal abscess/buttocks cellulitis healed. Started Velsipity  10/2023. Taking velsipity  2mg  every other day decreased from daily dosing for rising BP (though still at goal for her age).      Endoscopy:        09/25/2023 Flex Sig  Impression: normal findings regarding rectum, sigmoid colon, descending colon   and underwent biopsy confirming microscopic colitis, only notable for   external/internal hemorrhoids.       02/06/2023 Colonoscopy  Impression:    - Non-thrombosed internal hemorrhoids found on perianal exam.                         - The entire examined colon is normal. Biopsied from the  right and left   colon to rule out inflammation.                          Stool sample aspirated for c. difficile.                         - The examined portion of the ileum was normal.                         - Non-bleeding hemorrhoids.    Pathology:    A. Ascending colon, biopsy:  -Microscopic colitis with features suggestive of collagenous colitis.     B. Descending colon, biopsy:  -Microscopic colitis with features suggestive of collagenous colitis. 10/16/2021 Colonoscopy   Impression:     - Nodular mucosa in the sigmoid colon, in the descending colon, in the   ascending colon and in the cecum. Biopsied.    Upper endoscopy:  Impression:    - Small hiatal hernia.                         - Erythematous mucosa in the prepyloric region of the stomach. Biopsied.                         - Normal examined duodenum.    Pathology:  Stomach, reactive foveolar hyperplasia, and mild chronic   inactive gastritis    Cecum, mildly active colitis with features of chronicity   Colon, no evidence of active colitis or increase in intraepithelial   lymphocytes     04/2019 EGD Barrett's esophagus - esophagus biopsy normal. EGD with possibe short segment barretts and normal appearing duodenum    04/2019 colonoscopy - report shows normal colon, 2 polyps removed (tubular adenomas), normal ileum.     Imaging:      09/25/2023 CT pelvis  Impression: -Rectal wall thickening with perianal fat stranding extending into the gluteal region   and perineum on the left. No abscess or fistula is seen.   -Findings compatible with pelvic floor dysfunction.   -Bilateral nephrolithiasis.   -Uterine fibroids.   -Aortic atherosclerosis.     CT AP W Contrast 06/23/22: ([facility])   1. No acute intra-abdominal or intrapelvic process.   2. Stable bilateral nonobstructing renal calculi.   3. Aortic Atherosclerosis (ICD10-I70.0) and Emphysema (ICD10-J43.9).     07/26/2020 CT  IMPRESSION:     Bilateral nephrolithiasis with question of mild hydronephrosis.     There is subtle wall thickening and inflammatory stranding involving a Omega Durante segment of large bowel that extends from the cecum, ascending colon, transverse colon and portions of the descending colon near the splenic flexure. This likely reflects an inflammatory/infectious process such as colitis.     There is mild nonspecific circumferential thickening of the urinary bladder. Correlation with urinalysis recommended      Prior medications (type, dose, duration, response):  Entocort - recurrent courses/chronic use  Entyvio - lack of response  Remicade - response, but resulting infectious complications including urosepsis  Stelara  - initial response (with steroids), loss of response  Vancomycin  (initially for treatment of C diff), then with continued improvement of diarrhea  Velsipity  - clinical response            Past Medical History:   Past medical history:   Past Medical History:   Diagnosis Date  Arthritis     Barrett esophagus     Colon polyp     Disease of thyroid gland     GERD (gastroesophageal reflux disease)     Hip fracture        History of hip surgery     Hypercholesteremia     Hypertension     Microscopic colitis      Past surgical history:   Past Surgical History:   Procedure Laterality Date    BACK SURGERY      EYE SURGERY  2020?    cataracts    FRACTURE SURGERY  2022    hip replacememt    HIP FRACTURE SURGERY Right     JOINT REPLACEMENT  hip  2022    hip    PR COLONOSCOPY W/BIOPSY SINGLE/MULTIPLE N/A 10/16/2021    Procedure: COLONOSCOPY, FLEXIBLE, PROXIMAL TO SPLENIC FLEXURE; WITH BIOPSY, SINGLE OR MULTIPLE;  Surgeon: Liliane Ahmed Marker, MD;  Location: [facility];  Service: Gastroenterology    PR COLONOSCOPY W/BIOPSY SINGLE/MULTIPLE N/A 02/06/2023    Procedure: COLONOSCOPY, FLEXIBLE, PROXIMAL TO SPLENIC FLEXURE; WITH BIOPSY, SINGLE OR MULTIPLE;  Surgeon: Darra Gerlene Redbird, MD;  Location: [facility];  Service: Gastroenterology    PR UPPER GI ENDOSCOPY,BIOPSY N/A 10/16/2021    Procedure: UGI ENDOSCOPY; WITH BIOPSY, SINGLE OR MULTIPLE;  Surgeon: Liliane Ahmed Marker, MD;  Location: [facility];  Service: Gastroenterology     Family history:   Family History   Problem Relation Age of Onset    Colon cancer Brother     Diabetes Brother     Ulcerative colitis Son     Intellectual Disability Son     Hearing loss Mother     Cancer Father     Diabetes Maternal Grandfather     Breast cancer Sister      Social history:   Social History     Socioeconomic History    Marital status: Married     Spouse name: None    Number of children: None    Years of education: None    Highest education level: None   Tobacco Use    Smoking status: Former     Current packs/day: 0.00     Average packs/day: 0.5 packs/day for 4.0 years (2.0 ttl pk-yrs)     Types: Cigarettes     Quit date: 08/14/1969     Years since quitting: 54.9    Smokeless tobacco: Never   Vaping Use    Vaping status: Never Used   Substance and Sexual Activity    Alcohol use: Not Currently     Alcohol/week: 1.0 standard drink of alcohol     Types: 1 Glasses of wine per week     Comment: every now and then    Drug use: Never    Sexual activity: Not Currently     Partners: Male     Birth control/protection: Abstinence     Social Drivers of Health     Food Insecurity: No Food Insecurity (09/30/2023)    Received from [facility]    Hunger Vital Sign     Worried About Running Out of Food in the Last Year: Never true     Ran Out of Food in the Last Year: Never true   Transportation Needs: No Transportation Needs (09/30/2023)    Received from [facility] - Transportation     Lack of Transportation (Medical): No     Lack of  Transportation (Non-Medical): No   Housing: Low Risk  (07/11/2023)    Received from [facility] Stability Vital Sign     What is your living situation today?: I have a steady place to live     Think about the place you live. Do you have problems with any of the following? Choose all that apply:: None/None on this list             Allergies:     Allergies   Allergen Reactions    Bupropion Hives and Other (See Comments)    Infliximab Other (See Comments)     Unknown    Montelukast Other (See Comments)     Reaction:  Makes pt jittery    Reaction:  Makes pt jittery      Reaction:  Makes pt jittery Makes pt jittery      Makes pt jittery    Montelukast Sodium Other (See Comments)     Reaction:  Makes pt jittery  Makes pt jittery      Alendronate Sodium Other (See Comments) and Rash     Reaction:  GI upset   GI upset       Clindamycin Rash    Other Rash    Sulfa (Sulfonamide Antibiotics) Rash             Medications:     Current Outpatient Medications   Medication Sig Dispense Refill    acetaminophen (TYLENOL) 500 MG tablet Take 1 tablet (500 mg total) by mouth.      ascorbic acid, vitamin C, (VITAMIN C) 1000 MG tablet Take 1 tablet (1,000 mg total) by mouth.      aspirin (ECOTRIN) 81 MG tablet Take 1 tablet (81 mg total) by mouth daily.      bisoprolol (ZEBETA) 5 MG tablet Take 1 tablet (5 mg total) by mouth daily.      calcium carbonate 1,500 mg (600 mg elem calcium) tablet 1 tab Orally once a day      calcium carbonate-vit D3-min 600 mg calcium- 400 unit Tab Take by mouth.      cetirizine (ZYRTEC) 10 MG tablet Take 1 tablet (10 mg total) by mouth.      cholecalciferol, vitamin D3-50 mcg, 2,000 unit,, 50 mcg (2,000 unit) tablet Take 1 tablet (50 mcg total) by mouth daily.      cinnamon bark 500 mg capsule Take 500 mg by mouth.      colestipol  (COLESTID ) 1 gram tablet TAKE 4 TABLETS(4 GRAMS) BY MOUTH THREE TIMES DAILY 360 tablet 1    cycloSPORINE (RESTASIS) 0.05 % ophthalmic emulsion Administer 1 drop to both eyes every twelve (12) hours.      denosumab (PROLIA SUBQ) Inject under the skin every six (6) months.      etrasimod (VELSIPITY ) 2 mg Tab Take 1 tablet (2 mg) by mouth every other day. 30 tablet 0    famotidine (PEPCID) 40 MG tablet Take 10 mg by mouth every evening.      fluticasone propionate (FLONASE) 50 mcg/actuation nasal spray 2 sprays into each nostril.      Lactobacillus acidophilus 10 billion cell cap Take 1 capsule by mouth daily.      levothyroxine 88 mcg cap Take 1 capsule (88 mcg total) by mouth daily. For 6 days, skip the 7th day      loperamide  (IMODIUM ) 2 mg capsule TAKE 1 CAPSULE BY MOUTH FOUR TIMES DAILY AS NEEDED FOR DIARRHEA 120 capsule 5  meclizine (ANTIVERT) 25 mg tablet 1/2-1 pill up to 3 times daily for motion sickness/dizziness      multivitamin therapeutic with minerals (THERA-M) 27-0.4 mg Tab Take 1 tablet by mouth daily.      omega-3 fatty acids-fish oil 360-1,200 mg cap Take 1,200 mg by mouth.      pantoprazole  (PROTONIX ) 40 MG tablet TAKE 1 TABLET BY MOUTH TWICE DAILY BEFORE BREAKFAST AND BEFORE DINNER 180 tablet 0    simvastatin (ZOCOR) 20 MG tablet Take 1 tablet (20 mg total) by mouth daily before breakfast.      triamcinolone (KENALOG) 0.5 % ointment Apply 1 Application topically Two (2) times a day (at 8am and 12:00).      vancomycin  (VANCOCIN ) 125 MG capsule Take 1 capsule (125 mg total) by mouth two (2) times a day. 120 capsule 0    coenzyme Q10 200 mg capsule Take 1 capsule (200 mg total) by mouth daily. (Patient not taking: Reported on 06/30/2024)      flaxseed oil 1,000 mg cap 1 tab Orally Once a day      loratadine (CLARITIN) 10 mg tablet Take 1 tablet (10 mg total) by mouth daily. (Patient not taking: Reported on 06/30/2024)       No current facility-administered medications for this visit.             Physical Exam:      BP 140/78 (BP Site: L Arm, BP Position: Sitting, BP Cuff Size: Medium)  - Pulse 73  - Temp 36.8 ??C (98.2 ??F) (Temporal)  - Ht 165.1 cm (5' 5)  - Wt 50.3 kg (111 lb)  - BMI 18.47 kg/m??     Wt Readings from Last 6 Encounters:   06/30/24 50.3 kg (111 lb)   12/10/23 49 kg (108 lb)   10/31/23 49.1 kg (108 lb 3.2 oz)   10/21/23 49.3 kg (108 lb 9.6 oz)   10/07/23 48.6 kg (107 lb 1.6 oz)   09/30/23 50 kg (110 lb 4.8 oz)      Physical Exam  Vitals and nursing note reviewed.   Constitutional:       General: She is not in acute distress.     Appearance: Normal appearance. She is not ill-appearing or toxic-appearing.   HENT:      Head: Normocephalic and atraumatic.      Nose: Nose normal.      Mouth/Throat:      Mouth: Mucous membranes are moist.      Pharynx: Oropharynx is clear. No oropharyngeal exudate or posterior oropharyngeal erythema.   Eyes:      General: No scleral icterus. Extraocular Movements: Extraocular movements intact.      Pupils: Pupils are equal, round, and reactive to light.   Cardiovascular:      Rate and Rhythm: Normal rate and regular rhythm.      Heart sounds: No murmur heard.     No gallop.   Pulmonary:      Effort: Pulmonary effort is normal. No respiratory distress.      Breath sounds: Normal breath sounds. No wheezing or rales.   Abdominal:      General: There is no distension.      Palpations: Abdomen is soft. There is no mass.      Tenderness: There is no abdominal tenderness. There is no guarding or rebound.      Hernia: No hernia is present.   Musculoskeletal:         General: No swelling. Normal range  of motion.      Cervical back: Normal range of motion and neck supple.      Right lower leg: No edema.      Left lower leg: No edema.   Skin:     General: Skin is warm and dry.      Findings: No erythema, lesion or rash.      Comments: +Hyperpigmentation on her left lower extremity secondary to recent healing cellulitis   Neurological:      Mental Status: She is alert.      Motor: No weakness.      Gait: Gait normal.   Psychiatric:         Mood and Affect: Mood normal.         Behavior: Behavior normal.         Thought Content: Thought content normal.         Judgment: Judgment normal.                     Labs, Data & Indices:     Lab Review:   Lab Results   Component Value Date    WBC 5.7 03/12/2024    RBC 3.97 03/12/2024    HGB 12.9 03/12/2024     Lab Results   Component Value Date    AST 27 03/12/2024    ALT 20 03/12/2024    BUN 11 12/10/2023    BUN 13 07/30/2023    Creatinine Whole Blood, POC 0.7 09/07/2020    Creatinine 0.72 12/10/2023    Creatinine 0.98 07/30/2023    CO2 25.5 12/10/2023    CO2 23 07/30/2023    Albumin 3.7 12/10/2023    Calcium 9.8 12/10/2023    Calcium 9.4 07/30/2023     No results found for: TSH ............................................................................................................................................SABRA          Assessment & Recommendations:     Romy Ipock is a 83 y.o. female with a history of GERD, HTN, recent episode of cellulitis while immunocompromised, and indeterminate colitis/microscopic colitis with a prior lack of response steroids/budesonide (prior hip fracture), vedolizumab (no response), infliximab (response, but developed infectious complication of urosepsis), mesalamine , and stelara . Symptoms are currently in remission on velsipity  2mg  every other day decreased from daily dosing due to hypertension along with colestid  and loperamide .     PLAN:  Indeterminate colitis/microscopic colitis   Most recent flex sig 02/06/2023 with biopsy confirmed collagenous colitis. In clinical remission on Velsipity  2mg  every other day decreased from daily dosing for prior rising BP with SBP in the 140s (though still at goal given her age) along with colestid  and imodium  also supported by biochemical improvement in her [facility] to 107 12/2023 from 307 10/2023. Although she had an episode of cellulitis on velsipity  will continue this therapy for now and reassess the risks and benefits if she develops recurrent infections due to its substantial impact on her quality of life.     2. Immunocompromised state due to drug therapy   Due for monitoring labs on velsipity  (q3-6 month monitoring). Ordered today.     3. Cellulitis - left lower extremity 2/2 scratching a cut on her leg while on velsipity . Healed on oral outpatient antibiotics.     4. Incontinence - has resolved at this time    5. Reflux - controlled on PPI BID, discussed that she can elevate the head of her bed at night to help with nocturnal symptoms as she has a bed that she can change the head  angle on and has not been doing that.     6. Hypertension - monitoring her blood pressure at home, the elevation is likely associated with the Velspity for which her dosing has been decreased to every other day with continued symptom control.     7. Prevention - due for Tdap and PCV-10 per our immunization records, however she feels she completed these recently. She will review her records at home and present to a local pharmacy for these vaccines if she is due. Completed shingrix vaccine series in 2019. Counseled on annual influenza vaccine. Aged of out pap smears and mammography.     RTC in 6 months     Waddell Seltzer MD  Gastroenterology PGY7        I saw and evaluated the patient, participating in the key portions of the service.  I reviewed the resident???s note.  I agree with the resident???s findings and plan.     I personally spent 33 minutes face-to-face and non-face-to-face in the care of this patient, which includes all pre, intra, and post visit time on the date of service.  All documented time was specific to the E/M visit and does not include any procedures that may have been performed.                  Cameshia Cressman D. Jillaine Waren MD, MPH  Professor of Medicine  [employer]  at [facility] of Gastroenterology and Hepatology        --------------------------------------------  [facility] Gastroenterology & Hepatology  Multidisciplinary Inflammatory Bowel Disease Clinic

## 2024-06-30 NOTE — Unmapped (Addendum)
 It was a pleasure to see you today.     Please continue to take your velsipty 2mg  every other day as instructed. If you develop recurrent infections then we would discuss changing your medication but we don't think that changing just for this one infection is necessary yet.   We will check your labs today and communicate the results to you through your chart.   Check your records to make sure you haven't yet received the pneumonia shot in the last 5 years. If you have not, then you should get the pneumonia shot/pneumococcal vaccine (PCV-21 or PCV-20) at your local pharmacy. Our records also indicate that you are do for a tetanus shot because you have not had one in the last 10 years. You can get this vaccine at the same time as your pneumonia vaccine if you are not up to date.     APPOINTMENT SCHEDULING FOR GI CLINIC AND GI PROCEDURES:  GI MEDICINE CLINIC  [id number] option 1     GI PROCEDURES         [id number] option 2   *To schedule, reschedule, or cancel your GI appointment, please call [phone]. If you are unable to come to an appointment, please notify us  as soon as possible, preferably 24 hours in advance. Doing so may allow other patients with urgent needs to be scheduled in a cancelled appointment slot.     RADIOLOGY - to schedule your imaging procedure, please call [phone] opt 1     IBD NURSE COORDINATOR CONTACT - Mitzie Nevins RN  Phone: [phone] (direct line)  Fax: [phone]  * For urgent medical concerns after hours or on weekends and holidays, call [phone] and ask to speak to the GI Fellow on call.    * If you have a GI medical question or GI symptoms and would like to speak to your provider's healthcare team, please contact Mitzie Nevins (contact information above) OR you can send the GI healthcare team a message through MyChart at [email or web address]    TEST RESULTS   If you have a MyChart account, your new results will be sent to you through your MyChart account at [email or web address]. For results that require follow-up, a member of your healthcare team will also contact you directly.    PRESCRIPTION REFILL REQUESTS  To request prescription refills, please contact your pharmacy or send your healthcare team a message through your MyChart account at [email or web address]    RECORD REQUESTS  For questions related to medical records, please call Medical Records Release of Information at [phone]    FINANCIAL COUNSELOR   For billing and other financial questions/needs - please contact Reginald Reavis at [phone]. If you need to leave a message, please be sure to leave your full name, date of birth or MR#, best call back # and reason for call.

## 2024-07-02 LAB — VITAMIN D 25 HYDROXY: VITAMIN D, TOTAL (25OH): 49.3 ng/mL (ref 20.0–80.0)

## 2024-07-04 DIAGNOSIS — N1831 Chronic kidney disease, stage 3a: Secondary | ICD-10-CM | POA: Diagnosis not present

## 2024-07-04 DIAGNOSIS — I10 Essential (primary) hypertension: Secondary | ICD-10-CM | POA: Diagnosis not present

## 2024-07-06 DIAGNOSIS — R350 Frequency of micturition: Secondary | ICD-10-CM | POA: Diagnosis not present

## 2024-07-06 DIAGNOSIS — E782 Mixed hyperlipidemia: Secondary | ICD-10-CM | POA: Diagnosis not present

## 2024-07-06 DIAGNOSIS — B351 Tinea unguium: Secondary | ICD-10-CM | POA: Diagnosis not present

## 2024-07-06 DIAGNOSIS — K219 Gastro-esophageal reflux disease without esophagitis: Secondary | ICD-10-CM | POA: Diagnosis not present

## 2024-07-06 DIAGNOSIS — E039 Hypothyroidism, unspecified: Secondary | ICD-10-CM | POA: Diagnosis not present

## 2024-07-06 DIAGNOSIS — I10 Essential (primary) hypertension: Secondary | ICD-10-CM | POA: Diagnosis not present

## 2024-07-08 ENCOUNTER — Ambulatory Visit (HOSPITAL_BASED_OUTPATIENT_CLINIC_OR_DEPARTMENT_OTHER): Admitting: Internal Medicine

## 2024-07-13 DIAGNOSIS — R0602 Shortness of breath: Secondary | ICD-10-CM | POA: Diagnosis not present

## 2024-07-13 DIAGNOSIS — I10 Essential (primary) hypertension: Secondary | ICD-10-CM | POA: Diagnosis not present

## 2024-07-14 DIAGNOSIS — M5413 Radiculopathy, cervicothoracic region: Secondary | ICD-10-CM | POA: Diagnosis not present

## 2024-07-14 DIAGNOSIS — M50123 Cervical disc disorder at C6-C7 level with radiculopathy: Secondary | ICD-10-CM | POA: Diagnosis not present

## 2024-07-16 DIAGNOSIS — R232 Flushing: Secondary | ICD-10-CM | POA: Diagnosis not present

## 2024-07-19 DIAGNOSIS — E782 Mixed hyperlipidemia: Secondary | ICD-10-CM | POA: Diagnosis not present

## 2024-07-19 DIAGNOSIS — E039 Hypothyroidism, unspecified: Secondary | ICD-10-CM | POA: Diagnosis not present

## 2024-07-19 DIAGNOSIS — N1831 Chronic kidney disease, stage 3a: Secondary | ICD-10-CM | POA: Diagnosis not present

## 2024-07-19 DIAGNOSIS — I10 Essential (primary) hypertension: Secondary | ICD-10-CM | POA: Diagnosis not present

## 2024-07-21 DIAGNOSIS — K52839 Microscopic colitis, unspecified: Principal | ICD-10-CM

## 2024-07-21 MED ORDER — VANCOMYCIN 125 MG CAPSULE
ORAL_CAPSULE | Freq: Two times a day (BID) | ORAL | 0 refills | 0.00000 days
Start: 2024-07-21 — End: ?

## 2024-07-22 DIAGNOSIS — B351 Tinea unguium: Secondary | ICD-10-CM | POA: Diagnosis not present

## 2024-07-22 DIAGNOSIS — L0231 Cutaneous abscess of buttock: Secondary | ICD-10-CM | POA: Diagnosis not present

## 2024-07-22 MED ORDER — VANCOMYCIN 125 MG CAPSULE
ORAL_CAPSULE | Freq: Two times a day (BID) | ORAL | 0 refills | 60.00000 days
Start: 2024-07-22 — End: ?

## 2024-07-28 ENCOUNTER — Encounter: Payer: Self-pay | Admitting: Podiatry

## 2024-07-28 ENCOUNTER — Ambulatory Visit (INDEPENDENT_AMBULATORY_CARE_PROVIDER_SITE_OTHER): Admitting: Podiatry

## 2024-07-28 DIAGNOSIS — B351 Tinea unguium: Secondary | ICD-10-CM | POA: Diagnosis not present

## 2024-07-28 NOTE — Addendum Note (Signed)
 Addended by: WAYLAN ELIDIA PARAS on: 07/28/2024 01:40 PM   Modules accepted: Orders

## 2024-07-28 NOTE — Progress Notes (Signed)
  Subjective:  Patient ID: Debbie Houston, female    DOB: 1941-03-16,   MRN: [id number]  Chief Complaint  Patient presents with   Nail Problem    It's fungus in my toenails.  I been treating it with some of the over the counter fungus relief.    83 y.o. female presents for concern of fungus on her toenails. She has tried some OTC treatments but has not changes much. This has been ongoing for 6 months. She has tried some nail strengthener that seems to be helping more. She was given terbinafine by PCP but has not started it yet.   . Denies any other pedal complaints. Denies n/v/f/c.   Past Medical History:  Diagnosis Date   Arthritis    Benign labile hypertension    Cancer (HCC)    skin cancer on face   Cellulitis    Colitis    DJD (degenerative joint disease)    Endometrial polyp    GERD (gastroesophageal reflux disease)    History of basal cell carcinoma excision    History of esophagitis    HOH (hard of hearing)    right ear better than left  -- wears no aides   Hyperlipidemia    Hypothyroidism    Neuromuscular disorder (HCC)    neuropathy in feet   Osteopenia    Prediabetes    S/P right hip fracture 08/17/2020   Sepsis (HCC) 2023   per patient   Spinal stenosis 2025   per patient   Vitamin D  deficiency    Wears glasses     Objective:  Physical Exam: Vascular: DP/PT pulses 2/4 bilateral. CFT <3 seconds. Normal hair growth on digits. No edema.  Skin. No lacerations or abrasions bilateral feet. Bilateral second digit nails are thickened and dystrophic with subungual debris.  Musculoskeletal: MMT 5/5 bilateral lower extremities in DF, PF, Inversion and Eversion. Deceased ROM in DF of ankle joint.  Neurological: Sensation intact to light touch.   Assessment:   1. Onychomycosis      Plan:  Patient was evaluated and treated and all questions answered. -Examined patient -Discussed treatment options for painful dystrophic nails  -Clinical picture and Fungal  culture was obtained by removing a portion of the hard nail itself from each of the involved toenails using a sterile nail nipper and sent to [facility] lab. Patient tolerated the biopsy procedure well without discomfort or need for anesthesia.  -Discussed fungal nail treatment options including oral, topical, and laser treatments.  -Patient to return in 4 weeks for follow up evaluation and discussion of fungal culture results or sooner if symptoms worsen.    Asberry Failing, DPM

## 2024-07-29 DIAGNOSIS — K5100 Ulcerative (chronic) pancolitis without complications: Principal | ICD-10-CM

## 2024-07-29 MED ORDER — VELSIPITY 2 MG TABLET
ORAL_TABLET | ORAL | 0 refills | 60.00000 days
Start: 2024-07-29 — End: ?

## 2024-07-30 MED ORDER — VELSIPITY 2 MG TABLET
ORAL_TABLET | ORAL | 2 refills | 60.00000 days | Status: CP
Start: 2024-07-30 — End: ?
  Filled 2024-08-17: qty 30, 60d supply, fill #0

## 2024-07-30 NOTE — Unmapped (Signed)
 Patient is requesting the following refill  Requested Prescriptions     Pending Prescriptions Disp Refills    etrasimod (VELSIPITY ) 2 mg Tab 30 tablet 0     Sig: Take 1 tablet (2 mg) by mouth every other day.       Recent Visits  Date Type Provider Dept   06/30/24 Office Visit Long, Gerlene Redbird, MD [facility]   12/10/23 Office Visit Long, Gerlene Redbird, MD [facility] [place]   10/21/23 Office Visit Stem, Dorn Sharper, MD [facility] Multispecialty Surgery [facility]   10/07/23 Office Visit Stem, Dorn Sharper, MD [facility] Multispecialty Surgery [facility] [place]   09/30/23 Office Visit Long, Gerlene Redbird, MD [facility]   Showing recent visits within past 365 days and meeting all other requirements  Future Appointments  No visits were found meeting these conditions.  Showing future appointments within next 365 days and meeting all other requirements           Encounter for refill request:    Colonoscopy:     Lab Results   Component Value Date    WBC 5.3 06/30/2024    RBC 4.19 06/30/2024    HGB 13.4 06/30/2024    HCT 39.5 06/30/2024    PLT 219 06/30/2024    ALT 23 06/30/2024    AST 30 06/30/2024    ALKPHOS 45 (L) 06/30/2024    CRP <5.0 06/30/2024    CREATININE 0.70 06/30/2024       refills authorized x  

## 2024-08-03 DIAGNOSIS — N1831 Chronic kidney disease, stage 3a: Secondary | ICD-10-CM | POA: Diagnosis not present

## 2024-08-03 DIAGNOSIS — I10 Essential (primary) hypertension: Secondary | ICD-10-CM | POA: Diagnosis not present

## 2024-08-03 NOTE — Unmapped (Signed)
 Refill received for vancomycin . Unclear if need to continue this therapy. Reviewed with Dr. Darra and recommend discontinuation now that she has been maintained well on Velsipity  therapy.  Patient will notify IBD RN if symptoms worsen off the vancomycin .

## 2024-08-03 NOTE — Unmapped (Signed)
 [facility] Refill Coordination Note    Specialty Medication(s) to be Shipped:   Inflammatory Disorders: Velsipity     Other medication(s) to be shipped: No additional medications requested for fill at this time    Specialty Medications not needed at this time: N/A     Traci Bennett, DOB: [birth year]  Phone: [phone] (home)       All above HIPAA information was verified with patient.     Was a Nurse, learning disability used for this call? No    Completed refill call assessment today to schedule patient's medication shipment from the [facility]  [phone]).  All relevant notes have been reviewed.     Specialty medication(s) and dose(s) confirmed: Regimen is correct and unchanged.   Changes to medications: [name] reports no changes at this time.  Changes to insurance: No  New side effects reported not previously addressed with a pharmacist or physician: None reported  Questions for the pharmacist: No    Confirmed patient received a Conservation officer, historic buildings and a Surveyor, mining with first shipment. The patient will receive a drug information handout for each medication shipped and additional FDA Medication Guides as required.       DISEASE/MEDICATION-SPECIFIC INFORMATION        N/A    SPECIALTY MEDICATION ADHERENCE     Medication Adherence    Patient reported X missed doses in the last month: 0  Specialty Medication: VELSIPITY  2 mg Tab (etrasimod)  Patient is on additional specialty medications: No              Were doses missed due to medication being on hold? No    Velsipity  2 mg: 14 days of medicine on hand        REFERRAL TO PHARMACIST     Referral to the pharmacist: Not needed      [facility]     Shipping address confirmed in Epic.     Cost and Payment: Patient has a $0 copay, payment information is not required.    Delivery Scheduled: Yes, Expected medication delivery date: 08/18/24.     Medication will be delivered via UPS to the prescription address in Epic WAM.    [name]   [facility]  Specialty Technician

## 2024-08-04 DIAGNOSIS — L821 Other seborrheic keratosis: Secondary | ICD-10-CM | POA: Diagnosis not present

## 2024-08-04 DIAGNOSIS — Z85828 Personal history of other malignant neoplasm of skin: Secondary | ICD-10-CM | POA: Diagnosis not present

## 2024-08-04 DIAGNOSIS — D225 Melanocytic nevi of trunk: Secondary | ICD-10-CM | POA: Diagnosis not present

## 2024-08-06 ENCOUNTER — Encounter: Payer: BLUE CROSS/BLUE SHIELD | Admitting: Internal Medicine

## 2024-08-06 ENCOUNTER — Other Ambulatory Visit: Payer: Self-pay | Admitting: Podiatry

## 2024-08-06 DIAGNOSIS — Z23 Encounter for immunization: Secondary | ICD-10-CM | POA: Diagnosis not present

## 2024-08-06 DIAGNOSIS — J310 Chronic rhinitis: Secondary | ICD-10-CM | POA: Diagnosis not present

## 2024-08-06 DIAGNOSIS — L0291 Cutaneous abscess, unspecified: Secondary | ICD-10-CM | POA: Diagnosis not present

## 2024-08-11 ENCOUNTER — Other Ambulatory Visit: Payer: Self-pay

## 2024-08-11 ENCOUNTER — Encounter (HOSPITAL_COMMUNITY): Payer: Self-pay

## 2024-08-11 ENCOUNTER — Emergency Department (HOSPITAL_COMMUNITY)

## 2024-08-11 ENCOUNTER — Observation Stay (HOSPITAL_COMMUNITY)
Admission: EM | Admit: 2024-08-11 | Discharge: 2024-08-13 | Disposition: A | Discharge status: Home / Self Care | Source: Ambulatory Visit | Attending: Internal Medicine | Admitting: Internal Medicine

## 2024-08-11 DIAGNOSIS — Z85828 Personal history of other malignant neoplasm of skin: Secondary | ICD-10-CM | POA: Diagnosis not present

## 2024-08-11 DIAGNOSIS — Z8719 Personal history of other diseases of the digestive system: Secondary | ICD-10-CM | POA: Diagnosis not present

## 2024-08-11 DIAGNOSIS — E785 Hyperlipidemia, unspecified: Secondary | ICD-10-CM | POA: Insufficient documentation

## 2024-08-11 DIAGNOSIS — E039 Hypothyroidism, unspecified: Secondary | ICD-10-CM | POA: Diagnosis not present

## 2024-08-11 DIAGNOSIS — K219 Gastro-esophageal reflux disease without esophagitis: Secondary | ICD-10-CM | POA: Diagnosis not present

## 2024-08-11 DIAGNOSIS — Z96651 Presence of right artificial knee joint: Secondary | ICD-10-CM | POA: Insufficient documentation

## 2024-08-11 DIAGNOSIS — R7989 Other specified abnormal findings of blood chemistry: Secondary | ICD-10-CM | POA: Insufficient documentation

## 2024-08-11 DIAGNOSIS — R079 Chest pain, unspecified: Principal | ICD-10-CM | POA: Diagnosis present

## 2024-08-11 DIAGNOSIS — R42 Dizziness and giddiness: Secondary | ICD-10-CM | POA: Insufficient documentation

## 2024-08-11 DIAGNOSIS — Z87891 Personal history of nicotine dependence: Secondary | ICD-10-CM | POA: Diagnosis not present

## 2024-08-11 DIAGNOSIS — N390 Urinary tract infection, site not specified: Secondary | ICD-10-CM | POA: Insufficient documentation

## 2024-08-11 DIAGNOSIS — Z79899 Other long term (current) drug therapy: Secondary | ICD-10-CM | POA: Insufficient documentation

## 2024-08-11 DIAGNOSIS — R0789 Other chest pain: Secondary | ICD-10-CM | POA: Diagnosis present

## 2024-08-11 DIAGNOSIS — R000 Tachycardia, unspecified: Secondary | ICD-10-CM | POA: Diagnosis not present

## 2024-08-11 DIAGNOSIS — I10 Essential (primary) hypertension: Secondary | ICD-10-CM | POA: Insufficient documentation

## 2024-08-11 DIAGNOSIS — Z7982 Long term (current) use of aspirin: Secondary | ICD-10-CM | POA: Insufficient documentation

## 2024-08-11 DIAGNOSIS — K2100 Gastro-esophageal reflux disease with esophagitis, without bleeding: Secondary | ICD-10-CM | POA: Diagnosis present

## 2024-08-11 DIAGNOSIS — K52839 Microscopic colitis, unspecified: Secondary | ICD-10-CM | POA: Diagnosis present

## 2024-08-11 DIAGNOSIS — E871 Hypo-osmolality and hyponatremia: Secondary | ICD-10-CM | POA: Diagnosis present

## 2024-08-11 DIAGNOSIS — K5190 Ulcerative colitis, unspecified, without complications: Secondary | ICD-10-CM | POA: Insufficient documentation

## 2024-08-11 LAB — URINALYSIS, ROUTINE W REFLEX MICROSCOPIC
Bilirubin Urine: NEGATIVE
Glucose, UA: NEGATIVE mg/dL
Hgb urine dipstick: NEGATIVE
Ketones, ur: NEGATIVE mg/dL
Nitrite: NEGATIVE
Protein, ur: NEGATIVE mg/dL
Specific Gravity, Urine: 1.01 (ref 1.005–1.030)
pH: 7 (ref 5.0–8.0)

## 2024-08-11 LAB — URINALYSIS, MICROSCOPIC (REFLEX)

## 2024-08-11 LAB — CBC
HCT: 40.6 % (ref 36.0–46.0)
Hemoglobin: 13.2 g/dL (ref 12.0–15.0)
MCH: 32.2 pg (ref 26.0–34.0)
MCHC: 32.5 g/dL (ref 30.0–36.0)
MCV: 99 fL (ref 80.0–100.0)
Platelets: 209 K/uL (ref 150–400)
RBC: 4.1 MIL/uL (ref 3.87–5.11)
RDW: 14.5 % (ref 11.5–15.5)
WBC: 4.9 K/uL (ref 4.0–10.5)
nRBC: 0 % (ref 0.0–0.2)

## 2024-08-11 LAB — BASIC METABOLIC PANEL WITH GFR
Anion gap: 13 (ref 5–15)
BUN: 12 mg/dL (ref 8–23)
CO2: 21 mmol/L — ABNORMAL LOW (ref 22–32)
Calcium: 9.4 mg/dL (ref 8.9–10.3)
Chloride: 99 mmol/L (ref 98–111)
Creatinine, Ser: 0.79 mg/dL (ref 0.44–1.00)
GFR, Estimated: >60 mL/min (ref >60)
Glucose, Bld: 95 mg/dL (ref 70–99)
Potassium: 4.3 mmol/L (ref 3.5–5.1)
Sodium: 132 mmol/L — ABNORMAL LOW (ref 135–145)

## 2024-08-11 LAB — TROPONIN T, HIGH SENSITIVITY
Troponin T High Sensitivity: 43 ng/L — ABNORMAL HIGH (ref 0–19)
Troponin T High Sensitivity: 36 ng/L — ABNORMAL HIGH (ref 0–19)

## 2024-08-11 MED ORDER — OMEGA-3-ACID ETHYL ESTERS 1 G PO CAPS
1.0000 g | ORAL_CAPSULE | Freq: Every day | ORAL | Status: DC
  Administered 2024-08-12 – 2024-08-13 (×2): 1 g via ORAL
  Filled 2024-08-11 (×2): qty 1

## 2024-08-11 MED ORDER — COLESTIPOL HCL 1 G PO TABS: 1.0000 g | ORAL_TABLET | Freq: Every evening | ORAL | Status: DC | PRN

## 2024-08-11 MED ORDER — COLESTIPOL HCL 1 G PO TABS
1.0000 g | ORAL_TABLET | Freq: Every day | ORAL | Status: DC
  Administered 2024-08-12 (×2): 1 g via ORAL
  Filled 2024-08-11 (×3): qty 1

## 2024-08-11 MED ORDER — ETRASIMOD ARGININE 2 MG PO TABS: 2.0000 mg | ORAL_TABLET | ORAL | Status: DC

## 2024-08-11 MED ORDER — LEVOTHYROXINE SODIUM 88 MCG PO TABS
88.0000 ug | ORAL_TABLET | ORAL | Status: DC
  Administered 2024-08-13: 88 ug via ORAL
  Filled 2024-08-11: qty 1

## 2024-08-11 MED ORDER — LEVOTHYROXINE SODIUM 88 MCG PO TABS: 44.0000 ug | ORAL_TABLET | ORAL | Status: DC

## 2024-08-11 MED ORDER — ASPIRIN 81 MG PO TBEC
81.0000 mg | DELAYED_RELEASE_TABLET | Freq: Every day | ORAL | Status: DC
  Administered 2024-08-12: 81 mg via ORAL
  Filled 2024-08-11: qty 1

## 2024-08-11 MED ORDER — ASPIRIN 81 MG PO CHEW
324.0000 mg | CHEWABLE_TABLET | Freq: Once | ORAL | Status: AC
  Administered 2024-08-11: 324 mg via ORAL
  Filled 2024-08-11: qty 4

## 2024-08-11 MED ORDER — SIMVASTATIN 20 MG PO TABS
20.0000 mg | ORAL_TABLET | ORAL | Status: DC
  Administered 2024-08-12: 20 mg via ORAL
  Filled 2024-08-11: qty 1

## 2024-08-11 MED ORDER — ENOXAPARIN SODIUM 40 MG/0.4ML IJ SOSY
40.0000 mg | PREFILLED_SYRINGE | INTRAMUSCULAR | Status: DC
  Administered 2024-08-12 – 2024-08-13 (×2): 40 mg via SUBCUTANEOUS
  Filled 2024-08-11 (×2): qty 0.4

## 2024-08-11 MED ORDER — IRBESARTAN 150 MG PO TABS
150.0000 mg | ORAL_TABLET | Freq: Every day | ORAL | Status: DC
Start: 2024-08-11 — End: 2024-08-13
  Administered 2024-08-12 (×2): 150 mg via ORAL
  Filled 2024-08-11 (×2): qty 1

## 2024-08-11 MED ORDER — LEVOTHYROXINE SODIUM 88 MCG PO TABS
44.0000 ug | ORAL_TABLET | ORAL | Status: DC
  Administered 2024-08-12: 44 ug via ORAL
  Filled 2024-08-11: qty 1

## 2024-08-11 MED ORDER — LOPERAMIDE HCL 2 MG PO CAPS: 2.0000 mg | ORAL_CAPSULE | Freq: Two times a day (BID) | ORAL | Status: DC | PRN

## 2024-08-11 NOTE — ED Provider Notes (Signed)
 [place] EMERGENCY DEPARTMENT AT [facility] Provider Note   CSN: [id number] Arrival date & time: 08/11/24  1612     Patient presents with: Chest Pain   Debbie Houston is a 83 y.o. female.    Chest Pain    Pt states she had been having episodes of chest pain and dizziness.  Pt states she went to her doctors office today.  She is not sure how long it has been going on for exactly.  Pt states it has been there for a while.   Pt denies any chest pain now but she did have some while at the office.  It is a vague discomfort.  It lasts minutes at a time.  It is not brought on by exertion or deep breathing.  Pt also has intermittent dizziness.  That has been ongoing for weeks/months.  It usually occurs when she has been sitting and first stand up or bends over.    Prior to Admission medications   Medication Sig Start Date End Date Taking? Authorizing Provider  acetaminophen  (TYLENOL ) 500 MG tablet Take 500 mg by mouth as needed for moderate pain (pain score 4-6) or headache.    [provider]  Ascorbic Acid  (VITAMIN C) 1000 MG tablet Take 1,000 mg by mouth daily.     [provider]  aspirin  EC 81 MG tablet Take 1 tablet (81 mg total) by mouth in the morning and at bedtime. Patient taking differently: Take 81 mg by mouth at bedtime. 01/23/22   Edna Toribio LABOR, MD  bisoprolol  (ZEBETA ) 5 MG tablet Take 1 tablet every Morning for BP Patient taking differently: Take 5 mg by mouth at bedtime. 12/25/22   Tonita Fallow, MD  Calcium  Carbonate-Vit D-Min (CALCIUM  1200 PO) Take 1,200 mg by mouth daily.    [provider]  cetirizine (ZYRTEC ALLERGY) 10 MG tablet Take 10 mg by mouth daily as needed for allergies.    [provider]  CINNAMON PO Take 2,000 mg by mouth daily.    [provider]  Coenzyme Q10 (COQ10) 200 MG CAPS Take 200 mg by mouth daily. Patient not taking: Reported on 07/28/2024    [provider]  COLESTIPOL  HCL  PO Take 2 tablets by mouth in the morning and at bedtime.    [provider]  cycloSPORINE  (RESTASIS ) 0.05 % ophthalmic emulsion Place 2 drops into both eyes 2 (two) times daily.    [provider]  Etrasimod Arginine  (VELSIPITY ) 2 MG TABS Take 2 mg by mouth daily. 09/16/23   Cranford, Tonya, NP  Fluticasone Propionate (FLONASE NA) Place 1 spray into the nose as needed (allergies).    [provider]  levothyroxine  (SYNTHROID ) 88 MCG tablet Take 1 tab daily except M-W-F take 1/2 tab 11/19/23   Wilkinson, Dana E, NP  loperamide  (IMODIUM ) 2 MG capsule Take 2 mg by mouth 2 (two) times daily as needed for diarrhea or loose stools. 06/04/22   [provider]  meclizine  (ANTIVERT ) 25 MG tablet 1/2-1 pill up to 3 times daily for motion sickness/dizziness Patient not taking: Reported on 07/28/2024 08/12/23   Wilkinson, Dana E, NP  mesalamine  (LIALDA ) 1.2 g EC tablet Take 2.4 g by mouth in the morning and at bedtime. Patient not taking: Reported on 07/28/2024    [provider]  Multiple Vitamin (THERA VITAMIN PO) Take 1 tablet by mouth daily.    [provider]  Omega-3 Fatty Acids (FISH OIL) 1200 MG CAPS Take 1,200  mg by mouth daily.     [provider]  pantoprazole  (PROTONIX ) 40 MG tablet Take 1 tablet  2 x /day  for Heartburn & Indigestion Patient taking differently: Take 40 mg by mouth 2 (two) times daily. 03/08/20   Tonita Fallow, MD  Probiotic Product (PROBIOTIC PO) Take 1 capsule by mouth daily.    [provider]  PROLIA  60 MG/ML SOSY injection Inject 60 mg into the skin every 6 (six) months. 01/28/20   [provider]  pseudoephedrine  (SUDAFED) 120 MG 12 hr tablet Take  1 tablet  2 x /day (every 12 hours)  for Head and Chest Congestion Patient taking differently: Take 120 mg by mouth as needed for congestion. 11/15/21   Tonita Fallow, MD  simvastatin  (ZOCOR ) 20 MG tablet Take 1 tablet at Bedtime for Cholesterol 02/26/23    Cranford, Tonya, NP  triamcinolone  ointment (KENALOG ) 0.5 % Apply 1 Application topically 2 (two) times daily. 09/05/23   Wilkinson, Dana E, NP  vancomycin  (VANCOCIN ) 125 MG capsule Take 125 mg by mouth in the morning and at bedtime.    [provider]    Allergies: Remicade  [infliximab ], Fosamax [alendronate sodium], Montelukast, Singulair [montelukast sodium], Wellbutrin [bupropion], Clindamycin /lincomycin, and Sulfa antibiotics    Review of Systems  Cardiovascular:  Positive for chest pain.    Updated Vital Signs BP (!) 147/103 (BP Location: Left Arm)   Pulse 82   Temp 97.6 F (36.4 C) (Oral)   Resp 16   SpO2 98%   Physical Exam Vitals and nursing note reviewed.  Constitutional:      General: She is not in acute distress.    Appearance: She is well-developed.  HENT:     Head: Normocephalic and atraumatic.     Right Ear: External ear normal.     Left Ear: External ear normal.  Eyes:     General: No scleral icterus.       Right eye: No discharge.        Left eye: No discharge.     Conjunctiva/sclera: Conjunctivae normal.  Neck:     Trachea: No tracheal deviation.  Cardiovascular:     Rate and Rhythm: Normal rate and regular rhythm.  Pulmonary:     Effort: Pulmonary effort is normal. No respiratory distress.     Breath sounds: Normal breath sounds. No stridor. No wheezing or rales.  Abdominal:     General: Bowel sounds are normal. There is no distension.     Palpations: Abdomen is soft.     Tenderness: There is no abdominal tenderness. There is no guarding or rebound.  Musculoskeletal:        General: No tenderness or deformity.     Cervical back: Neck supple.  Skin:    General: Skin is warm and dry.     Findings: No rash.  Neurological:     General: No focal deficit present.     Mental Status: She is alert.     Cranial Nerves: No cranial nerve deficit, dysarthria or facial asymmetry.     Sensory: No sensory deficit.     Motor: No abnormal muscle tone  or seizure activity.     Coordination: Coordination normal.  Psychiatric:        Mood and Affect: Mood normal.     (all labs ordered are listed, but only abnormal results are displayed) Labs Reviewed  BASIC METABOLIC PANEL WITH GFR - Abnormal; Notable for the following components:      Result Value  Sodium 132 (*)    CO2 21 (*)    All other components within normal limits  TROPONIN T, HIGH SENSITIVITY - Abnormal; Notable for the following components:   Troponin T High Sensitivity 43 (*)    All other components within normal limits  TROPONIN T, HIGH SENSITIVITY - Abnormal; Notable for the following components:   Troponin T High Sensitivity 36 (*)    All other components within normal limits  CBC  URINALYSIS, ROUTINE W REFLEX MICROSCOPIC    EKG: EKG Interpretation Date/Time:  Tuesday August 11 2024 16:44:45 EDT Ventricular Rate:  83 PR Interval:  130 QRS Duration:  132 QT Interval:  385 QTC Calculation: 453 R Axis:   164  Text Interpretation: Sinus rhythm RBBB and LPFB No significant change since last tracing Confirmed by Randol Simmonds [id number]) on 08/11/2024 7:34:24 PM  Radiology: [facility] Chest 2 View Result Date: 08/11/2024 EXAM: 2 VIEW(S) XRAY OF THE CHEST 08/11/2024 04:32:58 PM COMPARISON: 06/22/2022 CLINICAL HISTORY: chest pain. Patient c/o chest pain since this morning. FINDINGS: LUNGS AND PLEURA: No focal pulmonary opacity. No pulmonary edema. No pleural effusion. No pneumothorax. HEART AND MEDIASTINUM: No acute abnormality of the cardiac and mediastinal silhouettes. BONES AND SOFT TISSUES: No acute osseous abnormality. IMPRESSION: 1. Normal chest radiograph without acute cardiopulmonary process. Electronically signed by: Waddell Calk MD 08/11/2024 04:57 PM EDT RP Workstation: [id number]     Procedures   Medications Ordered in the ED  aspirin  chewable tablet 324 mg (has no administration in time range)    Clinical Course as of 08/11/24 1937  Tue Aug 11, 2024  1929  CBC nl [JK]  1929 Basic metabolic panel(!) nl [JK]  1929 Troponin T, High Sensitivity(!) elevated, previous troponin I's were normal [JK]  1929 nl [JK]  1937 Discussed with Dr. Franky regarding admission [JK]    Clinical Course User Index [JK] Randol Simmonds, MD             HEART Score: 6                    Medical Decision Making Problems Addressed: Chest pain, unspecified type: acute illness or injury that poses a threat to life or bodily functions Elevated troponin: acute illness or injury that poses a threat to life or bodily functions  Amount and/or Complexity of Data Reviewed Labs: ordered. Decision-making details documented in ED Course. Radiology: ordered and independent interpretation performed.  Risk OTC drugs. Decision regarding hospitalization.   Patient presents ED with complaints of chest pain.  Patient reports vague discomfort but she did have an episode of chest pain.  She was sent from the doctor's office to be evaluated in the ED.  Patient is currently pain-free.  X-ray does not show any evidence of pneumonia.  EKG does not show acute ischemic changes.  Troponins however are elevated.  Previous troponins have been normal.  Patient has moderate risk heart score.  With her age comorbidities will consult the medical service admission for further cardiac workup     Final diagnoses:  Chest pain, unspecified type  Elevated troponin    ED Discharge Orders     None          Randol Simmonds, MD 08/11/24 [phone]

## 2024-08-11 NOTE — ED Notes (Signed)
 ED TO INPATIENT HANDOFF REPORT  ED Nurse Name and Phone #: Olen, RN [phone]  S Name/Age/Gender Debbie Houston 83 y.o. female Room/Bed: WA04/WA04  Code Status   Code Status: Prior  Home/SNF/Other Home Patient oriented to: self, place, time, and situation Is this baseline? Yes   Triage Complete: Triage complete  Chief Complaint Chest pain [R07.9]  Triage Note Pt reports that her doctor sent her here today because she told him she has had center chest pain and dizziness today. Pt reports feeling that she has gas.    Allergies Allergies  Allergen Reactions   Remicade  [Infliximab ] Other (See Comments)    Unknown    Fosamax [Alendronate Sodium] Other (See Comments)    GI upset    Montelukast Other (See Comments)    Reaction:  Makes pt jittery    Reaction:  Makes pt jittery  Reaction:  Makes pt jittery Makes pt jittery  Makes pt jittery   Singulair [Montelukast Sodium] Other (See Comments)    Makes pt jittery   Wellbutrin [Bupropion] Hives   Clindamycin /Lincomycin Rash   Sulfa Antibiotics Rash    Level of Care/Admitting Diagnosis ED Disposition     ED Disposition  Admit   Condition  --   Comment  Hospital Area: [facility] [[id number]]  Level of Care: Telemetry [5]  Admit to tele based on following criteria: Monitor for Ischemic changes  May place patient in observation at [facility] or [facility] if equivalent level of care is available:: No  Covid Evaluation: Asymptomatic - no recent exposure (last 10 days) testing not required  Diagnosis: Chest pain [255200]  Admitting Physician: FRANKY REDIA SAILOR [[id number]]  Attending Physician: FRANKY REDIA SAILOR [id number]  For patients discharging to extended facilities (i.e. SNF, AL, group homes or LTAC) initiate:: Discharge to SNF/Facility Placement COVID-19 Lab Testing Protocol          B Medical/Surgery History Past Medical History:  Diagnosis Date   Arthritis    Benign labile hypertension     Cancer (HCC)    skin cancer on face   Cellulitis    Colitis    DJD (degenerative joint disease)    Endometrial polyp    GERD (gastroesophageal reflux disease)    History of basal cell carcinoma excision    History of esophagitis    HOH (hard of hearing)    right ear better than left  -- wears no aides   Hyperlipidemia    Hypothyroidism    Neuromuscular disorder (HCC)    neuropathy in feet   Osteopenia    Prediabetes    S/P right hip fracture 08/17/2020   Sepsis (HCC) 2023   per patient   Spinal stenosis 2025   per patient   Vitamin D  deficiency    Wears glasses    Past Surgical History:  Procedure Laterality Date   BACK SURGERY  2020   BIOPSY  09/27/2023   Procedure: BIOPSY;  Surgeon: Rosalie Kitchens, MD;  Location: [facility];  Service: Gastroenterology;;   CATARACT EXTRACTION, BILATERAL Bilateral 2020   CONVERSION TO TOTAL HIP Right 01/22/2022   Procedure: CONVERSION TO TOTAL HIP;  Surgeon: Edna Toribio LABOR, MD;  Location: [facility] OR[facility];  Service: Orthopedics;  Laterality: Right;   DILATATION & CURETTAGE/HYSTEROSCOPY WITH MYOSURE N/A 10/27/2014   Procedure: DILATATION & CURETTAGE/HYSTEROSCOPY WITH MYOSURE;  Surgeon: Norleen GORMAN Skill, MD;  Location[facility];  Service: Gynecology;  Laterality: N/A;   DILATION AND CURETTAGE OF UTERUS  FLEXIBLE SIGMOIDOSCOPY N/A 09/27/2023   Procedure: FLEXIBLE SIGMOIDOSCOPY;  Surgeon: Rosalie Kitchens, MD;  Location: [facility];  Service: Gastroenterology;  Laterality: N/A;   HIP SURGERY Right 04/2020   rod and screw in right hip    MOHS SURGERY  2005   LEFT NASAL BRIDGE FOR BASAL CELL   TONSILLECTOMY  age 40     A IV Location/Drains/Wounds Patient Lines/Drains/Airways Status     Active Line/Drains/Airways     None            Intake/Output Last 24 hours No intake or output data in the 24 hours ending 08/11/24 1948  Labs/Imaging Results for orders placed or performed during the hospital encounter of 08/11/24 (from  the past 48 hours)  Basic metabolic panel     Status: Abnormal   Collection Time: 08/11/24  4:48 PM  Result Value Ref Range   Sodium 132 (L) 135 - 145 mmol/L   Potassium 4.3 3.5 - 5.1 mmol/L   Chloride 99 98 - 111 mmol/L   CO2 21 (L) 22 - 32 mmol/L   Glucose, Bld 95 70 - 99 mg/dL    Comment: Glucose reference range applies only to samples taken after fasting for at least 8 hours.   BUN 12 8 - 23 mg/dL   Creatinine, Ser 9.20 0.44 - 1.00 mg/dL   Calcium  9.4 8.9 - 10.3 mg/dL   GFR, Estimated >39 >39 mL/min    Comment: (NOTE) Calculated using the CKD-EPI Creatinine Equation (2021)    Anion gap 13 5 - 15    Comment: Performed at [facility], [address] [place], [place] [address]  CBC     Status: None   Collection Time: 08/11/24  4:48 PM  Result Value Ref Range   WBC 4.9 4.0 - 10.5 K/uL   RBC 4.10 3.87 - 5.11 MIL/uL   Hemoglobin 13.2 12.0 - 15.0 g/dL   HCT 59.3 63.9 - 53.9 %   MCV 99.0 80.0 - 100.0 fL   MCH 32.2 26.0 - 34.0 pg   MCHC 32.5 30.0 - 36.0 g/dL   RDW 85.4 88.4 - 84.4 %   Platelets 209 150 - 400 K/uL   nRBC 0.0 0.0 - 0.2 %    Comment: Performed at [facility], [address], [place], [place] [address]  Troponin T, High Sensitivity     Status: Abnormal   Collection Time: 08/11/24  4:48 PM  Result Value Ref Range   Troponin T High Sensitivity 43 (H) 0 - 19 ng/L    Comment: (NOTE) Biotin concentrations > 1000 ng/mL falsely decrease TnT results.  Serial cardiac troponin measurements are suggested.  Refer to the Links section for chest pain algorithms and additional  guidance. Performed at [facility], [address], [place], [place] [address]   Troponin T, High Sensitivity     Status: Abnormal   Collection Time: 08/11/24  6:37 PM  Result Value Ref Range   Troponin T High Sensitivity 36 (H) 0 - 19 ng/L    Comment: (NOTE) Biotin concentrations > 1000 ng/mL falsely decrease TnT results.  Serial cardiac  troponin measurements are suggested.  Refer to the Links section for chest pain algorithms and additional  guidance. Performed at [facility], [address], [place], [place] [address]    DG Chest 2 View Result Date: 08/11/2024 EXAM: 2 VIEW(S) XRAY OF THE CHEST 08/11/2024 04:32:58 PM COMPARISON: 06/22/2022 CLINICAL HISTORY: chest pain. Patient c/o chest pain  since this morning. FINDINGS: LUNGS AND PLEURA: No focal pulmonary opacity. No pulmonary edema. No pleural effusion. No pneumothorax. HEART AND MEDIASTINUM: No acute abnormality of the cardiac and mediastinal silhouettes. BONES AND SOFT TISSUES: No acute osseous abnormality. IMPRESSION: 1. Normal chest radiograph without acute cardiopulmonary process. Electronically signed by: Waddell Calk MD 08/11/2024 04:57 PM EDT RP Workstation: [id number]    Pending Labs Unresulted Labs (From admission, onward)     Start     Ordered   08/11/24 1934  Urinalysis, Routine w reflex microscopic -Urine, Clean Catch  Once,   URGENT       Question:  Specimen Source  Answer:  Urine, Clean Catch   08/11/24 1933            Vitals/Pain Today's Vitals   08/11/24 1643 08/11/24 1645 08/11/24 1909  BP:  (!) 147/103   Pulse:  82   Resp:  16   Temp:  97.6 F (36.4 C)   TempSrc:  Oral   SpO2:  98%   PainSc: 0-No pain  0-No pain    Isolation Precautions No active isolations  Medications Medications  aspirin  chewable tablet 324 mg (324 mg Oral Given 08/11/24 1946)    Mobility walks     Focused Assessments Cardiac Assessment Handoff:    Lab Results  Component Value Date   CKTOTAL 90 03/19/2018   Lab Results  Component Value Date   DDIMER 1.72 (H) 05/13/2022   Does the Patient currently have chest pain? No    R Recommendations: See Admitting Provider Note  Report given to:   Additional Notes:

## 2024-08-11 NOTE — ED Triage Notes (Signed)
 Pt reports that her doctor sent her here today because she told him she has had center chest pain and dizziness today. Pt reports feeling that she has gas.

## 2024-08-11 NOTE — H&P (Signed)
 History and Physical    Debbie Houston FMW:[id number] DOB: November 21, 1940 DOA: 08/11/2024  Patient coming from: Home.  Chief Complaint: Chest pain.  HPI: Debbie Houston is a 83 y.o. female with history of hypertension, hypothyroidism, microscopic colitis, hyperlipidemia was referred to the ER by patient's primary care physician with patient complaining of chest pain.  Patient states over the last 3 to 4 days she has been experiencing chest pain/epigastric pain happening 1 or 2 times a day mostly on exertion.  Denies any associated shortness of breath.  Over the last few weeks patient also has been having orthostatic hypotension like symptoms.  Pain is mostly epigastric and lower sternal area.  Not able to characterize exactly.  ED Course: In the ER troponins were 43 and 36 EKG shows normal sinus rhythm RBBB.  Sodium 132.  Patient admitted for further observation.  Review of Systems: As per HPI, rest all negative.   Past Medical History:  Diagnosis Date   Arthritis    Benign labile hypertension    Cancer (HCC)    skin cancer on face   Cellulitis    Colitis    DJD (degenerative joint disease)    Endometrial polyp    GERD (gastroesophageal reflux disease)    History of basal cell carcinoma excision    History of esophagitis    HOH (hard of hearing)    right ear better than left  -- wears no aides   Hyperlipidemia    Hypothyroidism    Neuromuscular disorder (HCC)    neuropathy in feet   Osteopenia    Prediabetes    S/P right hip fracture 08/17/2020   Sepsis (HCC) 2023   per patient   Spinal stenosis 2025   per patient   Vitamin D  deficiency    Wears glasses     Past Surgical History:  Procedure Laterality Date   BACK SURGERY  2020   BIOPSY  09/27/2023   Procedure: BIOPSY;  Surgeon: Rosalie Kitchens, MD;  Location: [facility] ENDOSCOPY;  Service: Gastroenterology;;   CATARACT EXTRACTION, BILATERAL Bilateral 2020   CONVERSION TO TOTAL HIP Right 01/22/2022   Procedure: CONVERSION TO TOTAL  HIP;  Surgeon: Edna Toribio LABOR, MD;  Location: [facility] ORS;  Service: Orthopedics;  Laterality: Right;   DILATATION & CURETTAGE/HYSTEROSCOPY WITH MYOSURE N/A 10/27/2014   Procedure: DILATATION & CURETTAGE/HYSTEROSCOPY WITH MYOSURE;  Surgeon: Norleen GORMAN Skill, MD;  Location: [facility] [place];  Service: Gynecology;  Laterality: N/A;   DILATION AND CURETTAGE OF UTERUS     FLEXIBLE SIGMOIDOSCOPY N/A 09/27/2023   Procedure: FLEXIBLE SIGMOIDOSCOPY;  Surgeon: Rosalie Kitchens, MD;  Location: [facility] ENDOSCOPY;  Service: Gastroenterology;  Laterality: N/A;   HIP SURGERY Right 04/2020   rod and screw in right hip    MOHS SURGERY  2005   LEFT NASAL BRIDGE FOR BASAL CELL   TONSILLECTOMY  age 14     reports that she quit smoking about 23 years ago. Her smoking use included cigarettes. She started smoking about 38 years ago. She has a 3.8 pack-year smoking history. She has never been exposed to tobacco smoke. She has never used smokeless tobacco. She reports current alcohol  use. She reports that she does not use drugs.  Allergies  Allergen Reactions   Remicade  [Infliximab ] Other (See Comments)    Reaction not recalled   Fosamax [Alendronate Sodium] Other (See Comments)    GI upset    Wellbutrin [Bupropion] Hives   Clindamycin /Lincomycin Rash   Singulair [Montelukast Sodium] Anxiety and Other (  See Comments)    Makes pt jittery   Sulfa Antibiotics Rash    Family History  Problem Relation Age of Onset   Hypertension Mother    Cancer Mother        COLON   Hyperlipidemia Mother    Cancer Father        BREAST WITH BRAIN METS   Hyperlipidemia Father    Cancer Sister        COLON (TEENS), BREAST (50)   Hyperlipidemia Sister    Diabetes Brother    Hyperlipidemia Brother    Hypertension Brother    Colon cancer Brother 37   Cancer Maternal Grandmother        RENAL   Hyperlipidemia Maternal Grandmother    Diabetes Maternal Grandfather    Stroke Maternal Grandfather    Hyperlipidemia Maternal  Grandfather    Hyperlipidemia Paternal Grandmother    Hyperlipidemia Paternal Grandfather    Autism Son    Colitis Son    Autism spectrum disorder Son    Healthy Son     Prior to Admission medications   Medication Sig Start Date End Date Taking? Authorizing Provider  acetaminophen  (TYLENOL ) 500 MG tablet Take 500 mg by mouth every 8 (eight) hours as needed (for pain or headaches).   Yes [provider]  Ascorbic Acid  (VITAMIN C) 1000 MG tablet Take 1,000 mg by mouth daily.    Yes [provider]  aspirin  EC 81 MG tablet Take 1 tablet (81 mg total) by mouth in the morning and at bedtime. Patient taking differently: Take 81 mg by mouth at bedtime. 01/23/22  Yes Edna Toribio LABOR, MD  Calcium  Carbonate-Vit D-Min (CALCIUM  1200 PO) Take 1,200 mg by mouth daily.   Yes [provider]  CLARITIN  10 MG tablet Take 10 mg by mouth in the morning.   Yes [provider]  colestipol  (COLESTID ) 1 g tablet Take 1 g by mouth See admin instructions. Take 1 gram by mouth after supper and an additional 1 gram at bedtime as needed if loose stools are present   Yes [provider]  cycloSPORINE  (RESTASIS ) 0.05 % ophthalmic emulsion Place 1 drop into both eyes 2 (two) times daily.   Yes [provider]  Etrasimod Arginine  (VELSIPITY ) 2 MG TABS Take 2 mg by mouth daily. Patient taking differently: Take 2 mg by mouth every other day. 09/16/23  Yes Cranford, Tonya, NP  fluticasone (FLONASE) 50 MCG/ACT nasal spray Place 1 spray into both nostrils in the morning.   Yes [provider]  levothyroxine  (SYNTHROID ) 88 MCG tablet Take 1 tab daily except M-W-F take 1/2 tab Patient taking differently: Take 44-88 mcg by mouth See admin instructions. Take 88 mcg by mouth in the morning one hour before breakfast on Sun/Tues/Thurs/Sat and 44 mcg on Mon/Wed/Fri 11/19/23  Yes Wilkinson, Dana E, NP  loperamide  (IMODIUM ) 2 MG capsule Take 2 mg by mouth 2 (two) times daily  as needed for diarrhea or loose stools. 06/04/22  Yes [provider]  meclizine  (ANTIVERT ) 25 MG tablet 1/2-1 pill up to 3 times daily for motion sickness/dizziness Patient taking differently: Take 25 mg by mouth 3 (three) times daily as needed for dizziness (or motion sickness). 08/12/23  Yes Wilkinson, Dana E, NP  Multiple Vitamin (THERA VITAMIN PO) Take 1 tablet by mouth daily with breakfast.   Yes [provider]  olmesartan (BENICAR) 20 MG tablet Take 20 mg by mouth at bedtime.   Yes [provider]  Omega-3  Fatty Acids (FISH OIL) 1200 MG CAPS Take 1,200 mg by mouth daily.    Yes [provider]  pantoprazole  (PROTONIX ) 40 MG tablet Take 1 tablet  2 x /day  for Heartburn & Indigestion Patient taking differently: Take 40 mg by mouth See admin instructions. Take 40 mg by mouth 30 minutes before breakfast and supper 03/08/20  Yes Tonita Fallow, MD  Probiotic Product (PROBIOTIC PO) Take 1 capsule by mouth daily.   Yes [provider]  PROLIA  60 MG/ML SOSY injection Inject 60 mg into the skin every 6 (six) months. 01/28/20  Yes [provider]  pseudoephedrine  (SUDAFED) 120 MG 12 hr tablet Take  1 tablet  2 x /day (every 12 hours)  for Head and Chest Congestion Patient taking differently: Take 120 mg by mouth at bedtime. 11/15/21  Yes Tonita Fallow, MD  simvastatin  (ZOCOR ) 20 MG tablet Take 1 tablet at Bedtime for Cholesterol Patient taking differently: Take 20 mg by mouth See admin instructions. Take 20 mg by mouth every other night 02/26/23  Yes Cranford, Tonya, NP  SYSTANE COMPLETE 0.6 % SOLN Place 1 drop into both eyes 3 (three) times daily as needed (for dryness).   Yes [provider]  TYLENOL  PM EXTRA STRENGTH 500-25 MG TABS tablet Take 1 tablet by mouth at bedtime.   Yes [provider]  bisoprolol  (ZEBETA ) 5 MG tablet Take 1 tablet every Morning for BP Patient not taking: Reported on 08/11/2024 12/25/22   Tonita Fallow, MD  triamcinolone  ointment (KENALOG ) 0.5 % Apply 1 Application topically 2 (two) times daily. Patient not taking: Reported on 08/11/2024 09/05/23   Jude Lonell BRAVO, NP    Physical Exam: Constitutional: Moderately built and nourished. Vitals:   08/11/24 1645 08/11/24 2043 08/11/24 2200 08/11/24 2304  BP: (!) 147/103 (!) 174/99 (!) 135/92 (!) 157/95  Pulse: 82 78 85 83  Resp: 16 18 16 18   Temp: 97.6 F (36.4 C) 98.3 F (36.8 C)  97.8 F (36.6 C)  TempSrc: Oral     SpO2: 98% 99% 97% 98%   Eyes: Anicteric no pallor. ENMT: No discharge from the ears eyes nose or mouth. Neck: No mass felt.  No neck rigidity. Respiratory: No rhonchi or crepitations. Cardiovascular: S1-S2 heard. Abdomen: Soft nontender bowel sound present. Musculoskeletal: No edema. Skin: No rash. Neurologic: Alert awake oriented to time place and person.  Moves all extremities. Psychiatric: Appears normal.  Normal affect.   Labs on Admission: I have personally reviewed following labs and imaging studies  CBC: Recent Labs  Lab 08/11/24 1648  WBC 4.9  HGB 13.2  HCT 40.6  MCV 99.0  PLT 209   Basic Metabolic Panel: Recent Labs  Lab 08/11/24 1648  NA 132*  K 4.3  CL 99  CO2 21*  GLUCOSE 95  BUN 12  CREATININE 0.79  CALCIUM  9.4   GFR: CrCl cannot be calculated (Unknown ideal weight.). Liver Function Tests: No results for input(s): AST, ALT, ALKPHOS, BILITOT, PROT, ALBUMIN in the last 168 hours. No results for input(s): LIPASE, AMYLASE in the last 168 hours. No results for input(s): AMMONIA in the last 168 hours. Coagulation Profile: No results for input(s): INR, PROTIME in the last 168 hours. Cardiac Enzymes: No results for input(s): CKTOTAL, CKMB, CKMBINDEX, TROPONINI in the last 168 hours. BNP (last 3 results) No results for input(s): PROBNP in the last 8760 hours. HbA1C: No results for input(s): HGBA1C in the last 72 hours. CBG: No results for  input(s): GLUCAP in  the last 168 hours. Lipid Profile: No results for input(s): CHOL, HDL, LDLCALC, TRIG, CHOLHDL, LDLDIRECT in the last 72 hours. Thyroid  Function Tests: No results for input(s): TSH, T4TOTAL, FREET4, T3FREE, THYROIDAB in the last 72 hours. Anemia Panel: No results for input(s): VITAMINB12, FOLATE, FERRITIN, TIBC, IRON, RETICCTPCT in the last 72 hours. Urine analysis:    Component Value Date/Time   COLORURINE YELLOW 08/11/2024 1957   APPEARANCEUR HAZY (A) 08/11/2024 1957   LABSPEC 1.010 08/11/2024 1957   PHURINE 7.0 08/11/2024 1957   GLUCOSEU NEGATIVE 08/11/2024 1957   HGBUR NEGATIVE 08/11/2024 1957   BILIRUBINUR NEGATIVE 08/11/2024 1957   KETONESUR NEGATIVE 08/11/2024 1957   PROTEINUR NEGATIVE 08/11/2024 1957   UROBILINOGEN 0.2 05/05/2015 1030   NITRITE NEGATIVE 08/11/2024 1957   LEUKOCYTESUR SMALL (A) 08/11/2024 1957   Sepsis Labs: @LABRCNTIP (procalcitonin:4,lacticidven:4) )No results found for this or any previous visit (from the past 240 hours).   Radiological Exams on Admission: DG Chest 2 View Result Date: 08/11/2024 EXAM: 2 VIEW(S) XRAY OF THE CHEST 08/11/2024 04:32:58 PM COMPARISON: 06/22/2022 CLINICAL HISTORY: chest pain. Patient c/o chest pain since this morning. FINDINGS: LUNGS AND PLEURA: No focal pulmonary opacity. No pulmonary edema. No pleural effusion. No pneumothorax. HEART AND MEDIASTINUM: No acute abnormality of the cardiac and mediastinal silhouettes. BONES AND SOFT TISSUES: No acute osseous abnormality. IMPRESSION: 1. Normal chest radiograph without acute cardiopulmonary process. Electronically signed by: Waddell Calk MD 08/11/2024 04:57 PM EDT RP Workstation: [id number]    EKG: Independently reviewed.  Normal sinus rhythm RBBB.  Assessment/Plan Principal Problem:   Chest pain Active Problems:   History of microscopic colitis   Essential hypertension   Hyperlipidemia   Hypothyroidism   GERD    Hyponatremia    Chest pain -   troponins elevated but flat.  Patient was given aspirin .  Check 2D echo.  Will consult cardiology.  Since patient's pain is also in the epigastric area we will check LFTs and lipase. Hypertension on ARB. Hyperlipidemia on statins. History of microscopic colitis on Etrasimod and colestipol .  As needed loperamide . Hypothyroidism on Synthroid . Hyponatremia follow metabolic panel closely. Dizziness patient states she gets dizzy on standing sometimes.  Check orthostatics.  Follow-up 2D echo.  Since patient chest pain will need further workup and more than 2 midnight stay.   DVT prophylaxis: Lovenox . Code Status: Full code. Family Communication: Discussed with patient. Disposition Plan: Monitored bed. Consults called: Cardiology. Admission status: Observation.

## 2024-08-12 ENCOUNTER — Observation Stay (HOSPITAL_COMMUNITY)

## 2024-08-12 DIAGNOSIS — R079 Chest pain, unspecified: Secondary | ICD-10-CM | POA: Diagnosis not present

## 2024-08-12 DIAGNOSIS — R0789 Other chest pain: Secondary | ICD-10-CM | POA: Diagnosis not present

## 2024-08-12 DIAGNOSIS — R7989 Other specified abnormal findings of blood chemistry: Secondary | ICD-10-CM | POA: Diagnosis not present

## 2024-08-12 DIAGNOSIS — E7800 Pure hypercholesterolemia, unspecified: Secondary | ICD-10-CM | POA: Diagnosis not present

## 2024-08-12 DIAGNOSIS — R000 Tachycardia, unspecified: Secondary | ICD-10-CM | POA: Diagnosis not present

## 2024-08-12 DIAGNOSIS — I10 Essential (primary) hypertension: Secondary | ICD-10-CM | POA: Diagnosis not present

## 2024-08-12 DIAGNOSIS — R42 Dizziness and giddiness: Secondary | ICD-10-CM | POA: Diagnosis not present

## 2024-08-12 LAB — COMPREHENSIVE METABOLIC PANEL WITH GFR
ALT: 18 U/L (ref 0–44)
AST: 23 U/L (ref 15–41)
Albumin: 4.1 g/dL (ref 3.5–5.0)
Alkaline Phosphatase: 40 U/L (ref 38–126)
Anion gap: 13 (ref 5–15)
BUN: 11 mg/dL (ref 8–23)
CO2: 22 mmol/L (ref 22–32)
Calcium: 9.3 mg/dL (ref 8.9–10.3)
Chloride: 101 mmol/L (ref 98–111)
Creatinine, Ser: 0.74 mg/dL (ref 0.44–1.00)
GFR, Estimated: >60 mL/min (ref >60)
Glucose, Bld: 102 mg/dL — ABNORMAL HIGH (ref 70–99)
Potassium: 4.1 mmol/L (ref 3.5–5.1)
Sodium: 136 mmol/L (ref 135–145)
Total Bilirubin: 0.6 mg/dL (ref 0.0–1.2)
Total Protein: 5.9 g/dL — ABNORMAL LOW (ref 6.5–8.1)

## 2024-08-12 LAB — ECHOCARDIOGRAM COMPLETE
Area-P 1/2: 5.5 cm2
Calc EF: 58.4 %
Height: 65 in
S' Lateral: 2.5 cm
Single Plane A2C EF: 55.6 %
Single Plane A4C EF: 59.7 %
Weight: 1734.4 [oz_av]

## 2024-08-12 LAB — CBC
HCT: 41.4 % (ref 36.0–46.0)
Hemoglobin: 13.4 g/dL (ref 12.0–15.0)
MCH: 32.1 pg (ref 26.0–34.0)
MCHC: 32.4 g/dL (ref 30.0–36.0)
MCV: 99.3 fL (ref 80.0–100.0)
Platelets: 198 K/uL (ref 150–400)
RBC: 4.17 MIL/uL (ref 3.87–5.11)
RDW: 14.5 % (ref 11.5–15.5)
WBC: 4 K/uL (ref 4.0–10.5)
nRBC: 0 % (ref 0.0–0.2)

## 2024-08-12 MED ORDER — PANTOPRAZOLE SODIUM 40 MG PO TBEC
40.0000 mg | DELAYED_RELEASE_TABLET | Freq: Two times a day (BID) | ORAL | Status: DC
  Administered 2024-08-12 – 2024-08-13 (×2): 40 mg via ORAL
  Filled 2024-08-12 (×2): qty 1

## 2024-08-12 MED ORDER — BISOPROLOL FUMARATE 5 MG PO TABS
5.0000 mg | ORAL_TABLET | Freq: Every day | ORAL | Status: DC
  Administered 2024-08-12 – 2024-08-13 (×2): 5 mg via ORAL
  Filled 2024-08-12 (×2): qty 1

## 2024-08-12 NOTE — ED Notes (Signed)
 Breakfast tray given to patient and eating.

## 2024-08-12 NOTE — Plan of Care (Signed)

## 2024-08-12 NOTE — Progress Notes (Signed)
 PROGRESS NOTE    Debbie Houston  FMW:[id number] DOB: May 10, 1941 DOA: 08/11/2024 PCP: Chet Mad, DO    Brief Narrative:   Debbie Houston is a 83 y.o. female with past medical history significant for HTN, HLD, hypothyroidism, GERD, microscopic colitis, neuropathy, former smoker who presented to [facility] ED on 08/11/2024 by direction of her PCP for chest pain.  Patient reports over the last 3-4 days, noted chest pain versus epigastric pain occurring 1-2 times per day with exertion.  Reports resolution after she takes Tums.  Denies associated shortness of breath.  In the ED, temperature 97.6 F, HR 82, RR 16, BP 147/103, SpO2 98% on room air.  WBC 4.9, hemoglobin 13.2, platelet count 209.  Sodium 132, potassium 4.3, chloride 99, CO2 21, glucose 95, BUN 12, creatinine 0.79.  High sensitivity troponin 43 followed by 36.  Urinalysis with small leukocytes, negative nitrite, many bacteria, 11-20 WBCs.  Chest x-ray with no acute cardiopulmonary disease process.  EKG with NSR, right bundle branch block and left posterior fascicular block.  Cardiology consulted.  TRH consulted for admission for further evaluation and management of chest pain.  Assessment & Plan:   Chest pain, atypical Elevated troponin Patient presenting with chest pain occurring 1-2 times daily with exertion, improves with Tums.  Patient with mildly elevated troponin, 43 followed by 36 on admission.  EKG with NSR, RBBB, left  posterior fascicular block.  -- Cardiology following, appreciate assistance -- TTE: Pending -- Lipid panel in a.m. -- Aspirin  81 mg p.o. daily -- Simvastatin  20 mg p.o. daily -- Monitor on telemetry -- Further per cardiology  HTN -- Bisoprolol  5 mg p.o. daily -- Irbesartan  150 g p.o. daily  Hypothyroidism -- Check TSH, T3, free T4 -- Levothyroxine  44 mcg Monday/Wednesday/Friday, 88 mcg Sunday/Tuesday/Thursday/Saturday  HLD -- Check lipid panel in a.m. -- Simvastatin  20 g p.o. daily  GERD --  Protonix  40 mg p.o. twice daily  Ulcerative colitis Follows with gastroenterology, [facility]. -- Continue Ertrasimod 2mg  PO every other day   DVT prophylaxis: enoxaparin  (LOVENOX ) injection 40 mg Start: 08/12/24 1000    Code Status: Full Code Family Communication: No family present bedside this morning  Disposition Plan:  Level of care: Telemetry Status is: Observation The patient remains OBS appropriate and will d/c before 2 midnights.    Consultants:  Cardiology  Procedures:  TTE  Antimicrobials:  None   Subjective: Patient seen examined bedside, remains in ED holding area.  Walking around room.  Seen by cardiology, pending echocardiogram.  Currently denies active chest pain.  No other complaints or concerns at this time.  Denies headache, no current dizziness, no fever/chills/night sweats, no palpitations, no abdominal pain, no nausea/vomiting/diarrhea, no focal weakness, no fatigue, no paresthesias.  No acute events overnight per nursing staff.  Objective: Vitals:   08/12/24 1015 08/12/24 1141 08/12/24 1204 08/12/24 1636  BP: (!) 156/105  (!) 158/97 (!) 143/94  Pulse: 96  83 84  Resp: 18  16 17   Temp:  98.1 F (36.7 C) 97.9 F (36.6 C) 98.6 F (37 C)  TempSrc:  Oral Oral Oral  SpO2: 97%  100% 99%  Weight:   49.2 kg   Height:   5' 5 (1.651 m)     Intake/Output Summary (Last 24 hours) at 08/12/2024 1720 Last data filed at 08/12/2024 1300 Gross per 24 hour  Intake 120 ml  Output --  Net 120 ml   Filed Weights   08/12/24 1204  Weight: 49.2 kg  Examination:  Physical Exam: GEN: NAD, alert and oriented x 3, wd/wn HEENT: NCAT, PERRL, EOMI, sclera clear, MMM PULM: CTAB w/o wheezes/crackles, normal respiratory effort, on room air CV: RRR w/o M/G/R GI: abd soft, NTND, NABS, no R/G/M MSK: no peripheral edema, muscle strength globally intact 5/5 bilateral upper/lower extremities NEURO: CN II-XII intact, no focal deficits, sensation to light touch  intact PSYCH: normal mood/affect Integumentary: dry/intact, no rashes or wounds    Data Reviewed: I have personally reviewed following labs and imaging studies  CBC: Recent Labs  Lab 08/11/24 1648 08/12/24 0445  WBC 4.9 4.0  HGB 13.2 13.4  HCT 40.6 41.4  MCV 99.0 99.3  PLT 209 198   Basic Metabolic Panel: Recent Labs  Lab 08/11/24 1648 08/12/24 0445  NA 132* 136  K 4.3 4.1  CL 99 101  CO2 21* 22  GLUCOSE 95 102*  BUN 12 11  CREATININE 0.79 0.74  CALCIUM  9.4 9.3   GFR: Estimated Creatinine Clearance: 42.1 mL/min (by C-G formula based on SCr of 0.74 mg/dL). Liver Function Tests: Recent Labs  Lab 08/12/24 0445  AST 23  ALT 18  ALKPHOS 40  BILITOT 0.6  PROT 5.9*  ALBUMIN 4.1   No results for input(s): LIPASE, AMYLASE in the last 168 hours. No results for input(s): AMMONIA in the last 168 hours. Coagulation Profile: No results for input(s): INR, PROTIME in the last 168 hours. Cardiac Enzymes: No results for input(s): CKTOTAL, CKMB, CKMBINDEX, TROPONINI in the last 168 hours. BNP (last 3 results) No results for input(s): PROBNP in the last 8760 hours. HbA1C: No results for input(s): HGBA1C in the last 72 hours. CBG: No results for input(s): GLUCAP in the last 168 hours. Lipid Profile: No results for input(s): CHOL, HDL, LDLCALC, TRIG, CHOLHDL, LDLDIRECT in the last 72 hours. Thyroid  Function Tests: No results for input(s): TSH, T4TOTAL, FREET4, T3FREE, THYROIDAB in the last 72 hours. Anemia Panel: No results for input(s): VITAMINB12, FOLATE, FERRITIN, TIBC, IRON, RETICCTPCT in the last 72 hours. Sepsis Labs: No results for input(s): PROCALCITON, LATICACIDVEN in the last 168 hours.  No results found for this or any previous visit (from the past 240 hours).       Radiology Studies: ECHOCARDIOGRAM COMPLETE Result Date: 08/12/2024    ECHOCARDIOGRAM REPORT   Patient Name:   Debbie Houston  Date of Exam: 08/12/2024 Medical Rec #:  [id number]      Height:       65.0 in Accession #:    [id number]     Weight:       108.4 lb Date of Birth:  11-Jun-1941      BSA:          1.525 m Patient Age:    82 years       BP:           158/97 mmHg Patient Gender: F              HR:           81 bpm. Exam Location:  Inpatient Procedure: 2D Echo (Both Spectral and Color Flow Doppler were utilized during            procedure). Indications:    Chest pain  History:        Patient has prior history of Echocardiogram examinations. Risk                 Factors:Hypertension.  Sonographer:    Charmaine Gaskins Referring Phys: [phone] TRACI R  TURNER IMPRESSIONS  1. Left ventricular ejection fraction, by estimation, is 60 to 65%. The left ventricle has normal function. The left ventricle has no regional wall motion abnormalities. Left ventricular diastolic parameters are consistent with Grade I diastolic dysfunction (impaired relaxation).  2. Right ventricular systolic function is normal. The right ventricular size is normal.  3. The mitral valve is normal in structure. No evidence of mitral valve regurgitation. No evidence of mitral stenosis.  4. The aortic valve has an indeterminant number of cusps. Aortic valve regurgitation is not visualized. No aortic stenosis is present.  5. The inferior vena cava is normal in size with greater than 50% respiratory variability, suggesting right atrial pressure of 3 mmHg. Comparison(s): No prior Echocardiogram. FINDINGS  Left Ventricle: Left ventricular ejection fraction, by estimation, is 60 to 65%. The left ventricle has normal function. The left ventricle has no regional wall motion abnormalities. The left ventricular internal cavity size was normal in size. There is  no left ventricular hypertrophy. Left ventricular diastolic parameters are consistent with Grade I diastolic dysfunction (impaired relaxation). Right Ventricle: The right ventricular size is normal. Right ventricular systolic function  is normal. Left Atrium: Left atrial size was normal in size. Right Atrium: Right atrial size was normal in size. Pericardium: There is no evidence of pericardial effusion. Mitral Valve: The mitral valve is normal in structure. Mild mitral annular calcification. No evidence of mitral valve regurgitation. No evidence of mitral valve stenosis. Tricuspid Valve: The tricuspid valve is normal in structure. Tricuspid valve regurgitation is trivial. No evidence of tricuspid stenosis. Aortic Valve: The aortic valve has an indeterminant number of cusps. Aortic valve regurgitation is not visualized. No aortic stenosis is present. Pulmonic Valve: The pulmonic valve was not well visualized. Pulmonic valve regurgitation is not visualized. No evidence of pulmonic stenosis. Aorta: The aortic root is normal in size and structure. Venous: The inferior vena cava is normal in size with greater than 50% respiratory variability, suggesting right atrial pressure of 3 mmHg. IAS/Shunts: The interatrial septum is aneurysmal. No atrial level shunt detected by color flow Doppler.  LEFT VENTRICLE PLAX 2D LVIDd:         4.50 cm     Diastology LVIDs:         2.50 cm     LV e' medial:    4.79 cm/s LV PW:         0.70 cm     LV E/e' medial:  14.1 LV IVS:        0.70 cm     LV e' lateral:   5.11 cm/s LVOT diam:     2.10 cm     LV E/e' lateral: 13.2 LVOT Area:     3.46 cm  LV Volumes (MOD) LV vol d, MOD A2C: 31.3 ml LV vol d, MOD A4C: 45.4 ml LV vol s, MOD A2C: 13.9 ml LV vol s, MOD A4C: 18.3 ml LV SV MOD A2C:     17.4 ml LV SV MOD A4C:     45.4 ml LV SV MOD BP:      22.9 ml RIGHT VENTRICLE RV Basal diam:  2.00 cm RV Mid diam:    1.80 cm RV S prime:     10.00 cm/s LEFT ATRIUM             Index        RIGHT ATRIUM           Index LA diam:  2.10 cm 1.38 cm/m   RA Area:     10.10 cm LA Vol (A2C):   34.2 ml 22.43 ml/m  RA Volume:   18.70 ml  12.26 ml/m LA Vol (A4C):   30.8 ml 20.20 ml/m LA Biplane Vol: 33.6 ml 22.04 ml/m  MITRAL VALVE                 TRICUSPID VALVE MV Area (PHT): 5.50 cm     TR Peak grad:   19.9 mmHg MV E velocity: 67.30 cm/s   TR Vmax:        223.00 cm/s MV A velocity: 114.00 cm/s MV E/A ratio:  0.59         SHUNTS                             Systemic Diam: 2.10 cm Redell Shallow MD Electronically signed by Redell Shallow MD Signature Date/Time: 08/12/2024/4:27:06 PM    Final    DG Chest 2 View Result Date: 08/11/2024 EXAM: 2 VIEW(S) XRAY OF THE CHEST 08/11/2024 04:32:58 PM COMPARISON: 06/22/2022 CLINICAL HISTORY: chest pain. Patient c/o chest pain since this morning. FINDINGS: LUNGS AND PLEURA: No focal pulmonary opacity. No pulmonary edema. No pleural effusion. No pneumothorax. HEART AND MEDIASTINUM: No acute abnormality of the cardiac and mediastinal silhouettes. BONES AND SOFT TISSUES: No acute osseous abnormality. IMPRESSION: 1. Normal chest radiograph without acute cardiopulmonary process. Electronically signed by: Waddell Calk MD 08/11/2024 04:57 PM EDT RP Workstation: [id number]        Scheduled Meds:  aspirin  EC  81 mg Oral QHS   bisoprolol   5 mg Oral Daily   colestipol   1 g Oral QPC supper   enoxaparin  (LOVENOX ) injection  40 mg Subcutaneous Q24H   [START ON 08/13/2024] Etrasimod Arginine   2 mg Oral QODAY   irbesartan   150 mg Oral QHS   [START ON 08/13/2024] levothyroxine   88 mcg Oral Once per day on Sunday Tuesday Thursday Saturday   And   levothyroxine   44 mcg Oral Once per day on Monday Wednesday Friday   omega-3 acid ethyl esters  1 g Oral Daily   simvastatin   20 mg Oral QODAY   Continuous Infusions:   LOS: 0 days    Time spent: 50 minutes spent on 08/12/2024 caring for this patient face-to-face including chart review, ordering labs/tests, documenting, discussion with nursing staff, consultants, updating family and interview/physical exam    Camellia PARAS [place], DO [facility] Available via Epic secure chat 7am-7pm After these hours, please refer to coverage provider listed on  [email or web address] 08/12/2024, 5:20 PM

## 2024-08-12 NOTE — ED Notes (Signed)
Pt independently ambulatory in hallway with steady gait.

## 2024-08-12 NOTE — Consult Note (Addendum)
 Cardiology Consultation   Patient ID: Debbie Houston MRN: [id number]; DOB: January 13, 1941  Admit date: 08/11/2024 Date of Consult: 08/12/2024  PCP:  Chet Mad, DO   [facility] Providers Cardiologist:  Madonna Large, DO      Patient Profile: Debbie Houston is a 83 y.o. female with a hx of HTN, labile HTN, HLD, prior cellulitis, hypothyroidism, RBBB, GERD, ulcerative colitis, neuropathy, and former smoker who is being seen 08/12/2024 for the evaluation of chest pain at the request of DR. [place].  History of Present Illness: Ms. Schepp was initially referred to cardiology for preoperative risk stratification prior to right hip replacement. Echocardiogram 2022 demonstrated normal biventricular function and no significant valvular disorders. Nuclear stress test 2023 was nonischemic. She is maintained on ASA, zebeta , and 20 mg zocor . She was last seen 04/2023 by Dr. Large and was doing well.   CT chest 04/2022 does not show significant coronary calcifications.   She has treated reflux and UC, on velsipity .  She presented to [facility] with chest pain and dizziness. She reported symptoms to PCP yesterday and was directed to the ER. She also reported orthostatic hypotension.   Troponin T 43 --> 36 EKG with RBBB and LPFB, does not appear ischemic  Cardiology was consulted for elevated troponin and chest pain. Due to epigastric pain, LFTs obtained and WNL.  UA concerning for leukocytes and bacteria. She has a history of UTI, per patient. She reports having intermittent dizziness with position changes x 2-3 months. About 2 weeks ago, she developed sinus congestion and has been taking flonase and sudafed, which have mildly worsened her dizziness. She has labile BP. She describes chest pain when lying flat at night to sleep, resolved with tums, she thinks this is consistent with her known GERD. She is active taking care of her husband and adult special needs child at home - can complete more  than 4.0 METS without angina. Really only gets chest pain when supine, relieved with sitting up.    Past Medical History:  Diagnosis Date   Arthritis    Benign labile hypertension    Cancer (HCC)    skin cancer on face   Cellulitis    Colitis    DJD (degenerative joint disease)    Endometrial polyp    GERD (gastroesophageal reflux disease)    History of basal cell carcinoma excision    History of esophagitis    HOH (hard of hearing)    right ear better than left  -- wears no aides   Hyperlipidemia    Hypothyroidism    Neuromuscular disorder (HCC)    neuropathy in feet   Osteopenia    Prediabetes    S/P right hip fracture 08/17/2020   Sepsis (HCC) 2023   per patient   Spinal stenosis 2025   per patient   Vitamin D  deficiency    Wears glasses     Past Surgical History:  Procedure Laterality Date   BACK SURGERY  2020   BIOPSY  09/27/2023   Procedure: BIOPSY;  Surgeon: Rosalie Kitchens, MD;  Location: [facility] ENDOSCOPY;  Service: Gastroenterology;;   CATARACT EXTRACTION, BILATERAL Bilateral 2020   CONVERSION TO TOTAL HIP Right 01/22/2022   Procedure: CONVERSION TO TOTAL HIP;  Surgeon: Edna Toribio LABOR, MD;  Location: [facility] ORS;  Service: Orthopedics;  Laterality: Right;   DILATATION & CURETTAGE/HYSTEROSCOPY WITH MYOSURE N/A 10/27/2014   Procedure: DILATATION & CURETTAGE/HYSTEROSCOPY WITH MYOSURE;  Surgeon: Norleen GORMAN Skill, MD;  Location: [facility];  Service: Gynecology;  Laterality: N/A;   DILATION AND CURETTAGE OF UTERUS     FLEXIBLE SIGMOIDOSCOPY N/A 09/27/2023   Procedure: FLEXIBLE SIGMOIDOSCOPY;  Surgeon: Rosalie Kitchens, MD;  Location: [facility] ENDOSCOPY;  Service: Gastroenterology;  Laterality: N/A;   HIP SURGERY Right 04/2020   rod and screw in right hip    MOHS SURGERY  2005   LEFT NASAL BRIDGE FOR BASAL CELL   TONSILLECTOMY  age 34     Home Medications:  Prior to Admission medications   Medication Sig Start Date End Date Taking? Authorizing Provider  acetaminophen   (TYLENOL ) 500 MG tablet Take 500 mg by mouth every 8 (eight) hours as needed (for pain or headaches).   Yes [provider]  Ascorbic Acid  (VITAMIN C) 1000 MG tablet Take 1,000 mg by mouth daily.    Yes [provider]  aspirin  EC 81 MG tablet Take 1 tablet (81 mg total) by mouth in the morning and at bedtime. Patient taking differently: Take 81 mg by mouth at bedtime. 01/23/22  Yes Edna Toribio LABOR, MD  Calcium  Carbonate-Vit D-Min (CALCIUM  1200 PO) Take 1,200 mg by mouth daily.   Yes [provider]  CLARITIN  10 MG tablet Take 10 mg by mouth in the morning.   Yes [provider]  colestipol  (COLESTID ) 1 g tablet Take 1 g by mouth See admin instructions. Take 1 gram by mouth after supper and an additional 1 gram at bedtime as needed if loose stools are present   Yes [provider]  cycloSPORINE  (RESTASIS ) 0.05 % ophthalmic emulsion Place 1 drop into both eyes 2 (two) times daily.   Yes [provider]  Etrasimod Arginine  (VELSIPITY ) 2 MG TABS Take 2 mg by mouth daily. Patient taking differently: Take 2 mg by mouth every other day. 09/16/23  Yes Cranford, Tonya, NP  fluticasone (FLONASE) 50 MCG/ACT nasal spray Place 1 spray into both nostrils in the morning.   Yes [provider]  levothyroxine  (SYNTHROID ) 88 MCG tablet Take 1 tab daily except M-W-F take 1/2 tab Patient taking differently: Take 44-88 mcg by mouth See admin instructions. Take 88 mcg by mouth in the morning one hour before breakfast on Sun/Tues/Thurs/Sat and 44 mcg on Mon/Wed/Fri 11/19/23  Yes Wilkinson, Dana E, NP  loperamide  (IMODIUM ) 2 MG capsule Take 2 mg by mouth 2 (two) times daily as needed for diarrhea or loose stools. 06/04/22  Yes [provider]  meclizine  (ANTIVERT ) 25 MG tablet 1/2-1 pill up to 3 times daily for motion sickness/dizziness Patient taking differently: Take 25 mg by mouth 3 (three) times daily as needed for dizziness (or motion sickness).  08/12/23  Yes Wilkinson, Dana E, NP  Multiple Vitamin (THERA VITAMIN PO) Take 1 tablet by mouth daily with breakfast.   Yes [provider]  olmesartan (BENICAR) 20 MG tablet Take 20 mg by mouth at bedtime.   Yes [provider]  Omega-3 Fatty Acids (FISH OIL) 1200 MG CAPS Take 1,200 mg by mouth daily.    Yes [provider]  pantoprazole  (PROTONIX ) 40 MG tablet Take 1 tablet  2 x /day  for Heartburn & Indigestion Patient taking differently: Take 40 mg by mouth See admin instructions. Take 40 mg by mouth 30 minutes before breakfast and supper 03/08/20  Yes Tonita Fallow, MD  Probiotic Product (PROBIOTIC PO) Take 1 capsule by mouth daily.   Yes [provider]  PROLIA  60 MG/ML SOSY injection Inject 60 mg into the skin every 6 (six) months.  01/28/20  Yes [provider]  pseudoephedrine  (SUDAFED) 120 MG 12 hr tablet Take  1 tablet  2 x /day (every 12 hours)  for Head and Chest Congestion Patient taking differently: Take 120 mg by mouth at bedtime. 11/15/21  Yes Tonita Fallow, MD  simvastatin  (ZOCOR ) 20 MG tablet Take 1 tablet at Bedtime for Cholesterol Patient taking differently: Take 20 mg by mouth See admin instructions. Take 20 mg by mouth every other night 02/26/23  Yes Cranford, Tonya, NP  SYSTANE COMPLETE 0.6 % SOLN Place 1 drop into both eyes 3 (three) times daily as needed (for dryness).   Yes [provider]  TYLENOL  PM EXTRA STRENGTH 500-25 MG TABS tablet Take 1 tablet by mouth at bedtime.   Yes [provider]  bisoprolol  (ZEBETA ) 5 MG tablet Take 1 tablet every Morning for BP Patient not taking: Reported on 08/11/2024 12/25/22   Tonita Fallow, MD  triamcinolone  ointment (KENALOG ) 0.5 % Apply 1 Application topically 2 (two) times daily. Patient not taking: Reported on 08/11/2024 09/05/23   Wilkinson, Dana E, NP    Scheduled Meds:  aspirin  EC  81 mg Oral QHS   colestipol   1 g Oral QPC supper   enoxaparin  (LOVENOX ) injection   40 mg Subcutaneous Q24H   [START ON 08/13/2024] Etrasimod Arginine   2 mg Oral QODAY   irbesartan   150 mg Oral QHS   [START ON 08/13/2024] levothyroxine   88 mcg Oral Once per day on Sunday Tuesday Thursday Saturday   And   levothyroxine   44 mcg Oral Once per day on Monday Wednesday Friday   omega-3 acid ethyl esters  1 g Oral Daily   simvastatin   20 mg Oral QODAY   Continuous Infusions:  PRN Meds: colestipol , loperamide   Allergies:    Allergies  Allergen Reactions   Remicade  [Infliximab ] Other (See Comments)    Reaction not recalled   Fosamax [Alendronate Sodium] Other (See Comments)    GI upset    Wellbutrin [Bupropion] Hives   Clindamycin /Lincomycin Rash   Singulair [Montelukast Sodium] Anxiety and Other (See Comments)    Makes pt jittery   Sulfa Antibiotics Rash    Social History:   Social History   Socioeconomic History   Marital status: Married    Spouse name: Not on file   Number of children: 2   Years of education: Not on file   Highest education level: Not on file  Occupational History   Not on file  Tobacco Use   Smoking status: Former    Current packs/day: 0.00    Average packs/day: 0.3 packs/day for 15.0 years (3.8 ttl pk-yrs)    Types: Cigarettes    Start date: 53    Quit date: 2002    Years since quitting: 23.7    Passive exposure: Never   Smokeless tobacco: Never   Tobacco comments:    Former light smoker, quit 15+ years ago, can't recall  Vaping Use   Vaping status: Never Used  Substance and Sexual Activity   Alcohol  use: Yes    Comment: wine occ   Drug use: No   Sexual activity: Not Currently    Partners: Male    Birth control/protection: Post-menopausal  Other Topics Concern   Not on file  Social History Narrative   Are you right handed or left handed? Left Handed    Are you currently employed ? No   What is your current occupation? No. Use to teacher [employer]    Do you live at  home alone? No   Who lives with you? Lives with  husband    What type of home do you live in: 1 story or 2 story? One story home              Social Drivers of Health   Financial Resource Strain: Not on file  Food Insecurity: No Food Insecurity (09/30/2023)   Hunger Vital Sign    Worried About Running Out of Food in the Last Year: Never true    Ran Out of Food in the Last Year: Never true  Transportation Needs: No Transportation Needs (09/30/2023)   PRAPARE - Administrator, Civil Service (Medical): No    Lack of Transportation (Non-Medical): No  Physical Activity: Not on file  Stress: Not on file  Social Connections: Not on file  Intimate Partner Violence: Patient Unable To Answer (06/30/2024)   Received [facility]   Humiliation, Afraid, Rape, and Kick questionnaire    Within the last year, have you been afraid of your partner or ex-partner?: Patient unable to answer    Within the last year, have you been humiliated or emotionally abused in other ways by your partner or ex-partner?: Patient unable to answer    Within the last year, have you been kicked, hit, slapped, or otherwise physically hurt by your partner or ex-partner?: Patient unable to answer    Within the last year, have you been raped or forced to have any kind of sexual activity by your partner or ex-partner?: Patient unable to answer    Family History:    Family History  Problem Relation Age of Onset   Hypertension Mother    Cancer Mother        COLON   Hyperlipidemia Mother    Cancer Father        BREAST WITH BRAIN METS   Hyperlipidemia Father    Cancer Sister        COLON (TEENS), BREAST (50)   Hyperlipidemia Sister    Diabetes Brother    Hyperlipidemia Brother    Hypertension Brother    Colon cancer Brother 84   Cancer Maternal Grandmother        RENAL   Hyperlipidemia Maternal Grandmother    Diabetes Maternal Grandfather    Stroke Maternal Grandfather    Hyperlipidemia Maternal Grandfather    Hyperlipidemia Paternal  Grandmother    Hyperlipidemia Paternal Grandfather    Autism Son    Colitis Son    Autism spectrum disorder Son    Healthy Son      ROS:  Please see the history of present illness.   All other ROS reviewed and negative.     Physical Exam/Data: Vitals:   08/12/24 0100 08/12/24 0200 08/12/24 0343 08/12/24 0731  BP: (!) 147/93  (!) 145/86 (!) 157/96  Pulse: 82  85 85  Resp: 16  16 16   Temp:  98.1 F (36.7 C) 98.7 F (37.1 C) 98.1 F (36.7 C)  TempSrc:   Oral Oral  SpO2: 94%  98% 97%   No intake or output data in the 24 hours ending 08/12/24 1021    03/31/2024    1:25 PM 03/17/2024    1:06 PM 02/12/2024    3:08 PM  Last 3 Weights  Weight (lbs) 113 lb 3.2 oz 112 lb 3.2 oz 110 lb  Weight (kg) 51.347 kg 50.894 kg 49.896 kg     There is no height or weight on file to calculate BMI.  General:  elderly female in NAD, sitting in the chair HEENT: normal Neck: no JVD Vascular: No carotid bruits; Distal pulses 2+ bilaterally Cardiac:  regular rhythm, tachycardic rate Lungs:  clear to auscultation bilaterally, no wheezing, rhonchi or rales  Abd: soft, nontender, no hepatomegaly  Ext: no edema Musculoskeletal:  No deformities, BUE and BLE strength normal and equal Skin: warm and dry  Neuro:  CNs 2-12 intact, no focal abnormalities noted Psych:  Normal affect   EKG:  The EKG was personally reviewed and demonstrates:  SR with HR 83, RBBB, LPFB Telemetry:  Telemetry was personally reviewed and demonstrates:  SR with HR in the 80s, has been off telemetry, will place patient back on telemetry to rule out atrial arrhythmia, was tachycardic on exam  Relevant CV Studies:  Nuclear stress test 2023: Nondiagnostic ECG stress. The heart rate response was consistent with walking Lexiscan  stress and achieved 84% of MPHR.  Myocardial perfusion is normal. Overall LV systolic function is normal without regional wall motion abnormalities. Stress LV EF: 52%.  No previous exam available for  comparison. Low risk.    Echo 2022: Normal LV systolic function with visual EF 60-65%. Left ventricle cavity  is normal in size. Normal left ventricular wall thickness. Normal global  wall motion. Normal diastolic filling pattern, normal LAP.  No significant valvular abnormalities.  No prior study for comparison.   Laboratory Data: High Sensitivity Troponin:  No results for input(s): TROPONINIHS in the last 720 hours.   Chemistry Recent Labs  Lab 08/11/24 1648 08/12/24 0445  NA 132* 136  K 4.3 4.1  CL 99 101  CO2 21* 22  GLUCOSE 95 102*  BUN 12 11  CREATININE 0.79 0.74  CALCIUM  9.4 9.3  GFRNONAA >60 >60  ANIONGAP 13 13    Recent Labs  Lab 08/12/24 0445  PROT 5.9*  ALBUMIN 4.1  AST 23  ALT 18  ALKPHOS 40  BILITOT 0.6   Lipids No results for input(s): CHOL, TRIG, HDL, LABVLDL, LDLCALC, CHOLHDL in the last 168 hours.  Hematology Recent Labs  Lab 08/11/24 1648 08/12/24 0445  WBC 4.9 4.0  RBC 4.10 4.17  HGB 13.2 13.4  HCT 40.6 41.4  MCV 99.0 99.3  MCH 32.2 32.1  MCHC 32.5 32.4  RDW 14.5 14.5  PLT 209 198   Thyroid  No results for input(s): TSH, FREET4 in the last 168 hours.  BNPNo results for input(s): BNP, PROBNP in the last 168 hours.  DDimer No results for input(s): DDIMER in the last 168 hours.  Radiology/Studies:  DG Chest 2 View Result Date: 08/11/2024 EXAM: 2 VIEW(S) XRAY OF THE CHEST 08/11/2024 04:32:58 PM COMPARISON: 06/22/2022 CLINICAL HISTORY: chest pain. Patient c/o chest pain since this morning. FINDINGS: LUNGS AND PLEURA: No focal pulmonary opacity. No pulmonary edema. No pleural effusion. No pneumothorax. HEART AND MEDIASTINUM: No acute abnormality of the cardiac and mediastinal silhouettes. BONES AND SOFT TISSUES: No acute osseous abnormality. IMPRESSION: 1. Normal chest radiograph without acute cardiopulmonary process. Electronically signed by: Waddell Calk MD 08/11/2024 04:57 PM EDT RP Workstation: [id number]      Assessment and Plan:  Chest pain - atypical Elevated troponin - EKG appears nonischemic - troponin 43 --> 36 - reassuring nuclear stress test 2023 - will obtain repeat echo - chest pain sounds atypical, suspect related to her GERD - no exertional chest pain - question troponin elevation due to possible UTI - will obtain echo - if echo largely unremarkable, would plan for discharge and OP CT coronary -  I suspect she would be a good candidate for angiography if indicated, she is agreeable   Tachycardia on exam - repeat stat EKG with SR in the 80s - when pt re-attached to telemetry, tachycardic in the 100s - consider heart monitor at discharge   Hypertension - continue zebeta    Hyperlipidemia with LDL goal < 70 09/23/2023: Cholesterol 133; HDL 47; LDL Cholesterol (Calc) 70; Triglycerides 82 - on zocor    Orthostatic dizziness - please check orthostatic vitals to rule out hypotension - suspect related to sinus medications - no falls or syncope   Risk Assessment/Risk Scores:      For questions or updates, please contact [facility] Please consult [email or web address] for contact info under      Signed, Jon Nat Hails, PA  08/12/2024 10:21 AM

## 2024-08-13 ENCOUNTER — Other Ambulatory Visit (HOSPITAL_COMMUNITY): Payer: Self-pay

## 2024-08-13 ENCOUNTER — Other Ambulatory Visit: Payer: Self-pay

## 2024-08-13 ENCOUNTER — Observation Stay (INDEPENDENT_AMBULATORY_CARE_PROVIDER_SITE_OTHER)

## 2024-08-13 DIAGNOSIS — R7989 Other specified abnormal findings of blood chemistry: Secondary | ICD-10-CM

## 2024-08-13 DIAGNOSIS — R079 Chest pain, unspecified: Secondary | ICD-10-CM

## 2024-08-13 DIAGNOSIS — E782 Mixed hyperlipidemia: Secondary | ICD-10-CM

## 2024-08-13 DIAGNOSIS — R42 Dizziness and giddiness: Secondary | ICD-10-CM | POA: Diagnosis not present

## 2024-08-13 DIAGNOSIS — R000 Tachycardia, unspecified: Secondary | ICD-10-CM

## 2024-08-13 DIAGNOSIS — I10 Essential (primary) hypertension: Secondary | ICD-10-CM | POA: Diagnosis not present

## 2024-08-13 DIAGNOSIS — E7800 Pure hypercholesterolemia, unspecified: Secondary | ICD-10-CM

## 2024-08-13 DIAGNOSIS — R0789 Other chest pain: Secondary | ICD-10-CM | POA: Diagnosis not present

## 2024-08-13 LAB — LIPID PANEL
Cholesterol: 182 mg/dL (ref 0–200)
HDL: 68 mg/dL (ref >40)
LDL Cholesterol: 102 mg/dL — ABNORMAL HIGH (ref 0–99)
Total CHOL/HDL Ratio: 2.7 ratio
Triglycerides: 61 mg/dL (ref <150)
VLDL: 12 mg/dL (ref 0–40)

## 2024-08-13 LAB — BASIC METABOLIC PANEL WITH GFR
Anion gap: 11 (ref 5–15)
BUN: 18 mg/dL (ref 8–23)
CO2: 20 mmol/L — ABNORMAL LOW (ref 22–32)
Calcium: 8.8 mg/dL — ABNORMAL LOW (ref 8.9–10.3)
Chloride: 100 mmol/L (ref 98–111)
Creatinine, Ser: 0.72 mg/dL (ref 0.44–1.00)
GFR, Estimated: >60 mL/min (ref >60)
Glucose, Bld: 100 mg/dL — ABNORMAL HIGH (ref 70–99)
Potassium: 4.1 mmol/L (ref 3.5–5.1)
Sodium: 131 mmol/L — ABNORMAL LOW (ref 135–145)

## 2024-08-13 LAB — TSH: TSH: 14 u[IU]/mL — ABNORMAL HIGH (ref 0.350–4.500)

## 2024-08-13 LAB — T4, FREE: Free T4: 1.06 ng/dL (ref 0.61–1.12)

## 2024-08-13 MED ORDER — CEPHALEXIN 500 MG PO CAPS: 500.0000 mg | ORAL_CAPSULE | Freq: Two times a day (BID) | ORAL | 0 refills | Status: AC

## 2024-08-13 MED ORDER — METOPROLOL TARTRATE 25 MG PO TABS
25.0000 mg | ORAL_TABLET | Freq: Once | ORAL | 0 refills | Status: DC
  Filled 2024-08-13: qty 1, 1d supply, fill #0

## 2024-08-13 MED ORDER — BISOPROLOL FUMARATE 5 MG PO TABS: 5.0000 mg | ORAL_TABLET | Freq: Every day | ORAL | 0 refills | Status: AC

## 2024-08-13 NOTE — Progress Notes (Signed)
   08/13/24 0856  TOC Brief Assessment  Insurance and Status Reviewed  Patient has primary care physician Yes  Home environment has been reviewed Resides with family in single family home  Prior level of function: Independent with ADLs at baseline  Prior/Current Home Services No current home services  Social Drivers of Health Review SDOH reviewed no interventions necessary  Readmission risk has been reviewed Yes  Transition of care needs no transition of care needs at this time

## 2024-08-13 NOTE — Care Management Obs Status (Signed)
 MEDICARE OBSERVATION STATUS NOTIFICATION   Patient Details  Name: Debbie Houston MRN: [id number] Date of Birth: 1941-09-15   Medicare Observation Status Notification Given:  Yes    Duwaine GORMAN Aran, LCSW 08/13/2024, 9:31 AM

## 2024-08-13 NOTE — Progress Notes (Unsigned)
 Enrolled patient for a 14 day Zio XT monitor to be mailed to patients home  [place] to read

## 2024-08-13 NOTE — Progress Notes (Addendum)
 Progress Note  Patient Name: Debbie Houston Date of Encounter: 08/13/2024  [facility] Cardiologist: Madonna Large, DO   Subjective   Denies any further chest pain or shortness of breath  Inpatient Medications    Scheduled Meds:  aspirin  EC  81 mg Oral QHS   bisoprolol   5 mg Oral Daily   colestipol   1 g Oral QPC supper   enoxaparin  (LOVENOX ) injection  40 mg Subcutaneous Q24H   Etrasimod Arginine   2 mg Oral QODAY   irbesartan   150 mg Oral QHS   levothyroxine   88 mcg Oral Once per day on Sunday Tuesday Thursday Saturday   And   levothyroxine   44 mcg Oral Once per day on Monday Wednesday Friday   omega-3 acid ethyl esters  1 g Oral Daily   pantoprazole   40 mg Oral BID AC   simvastatin   20 mg Oral QODAY   Continuous Infusions:  PRN Meds: colestipol , loperamide    Vital Signs    Vitals:   08/13/24 0629 08/13/24 0630 08/13/24 0632 08/13/24 1340  BP: (!) 142/90 (!) 131/93 129/85 127/83  Pulse: 66 70 77 67  Resp: 18   18  Temp: 98 F (36.7 C)   97.6 F (36.4 C)  TempSrc: Oral   Oral  SpO2: 99%   99%  Weight:      Height:        Intake/Output Summary (Last 24 hours) at 08/13/2024 1348 Last data filed at 08/13/2024 0842 Gross per 24 hour  Intake 480 ml  Output --  Net 480 ml      08/12/2024   12:04 PM 03/31/2024    1:25 PM 03/17/2024    1:06 PM  Last 3 Weights  Weight (lbs) 108 lb 6.4 oz 113 lb 3.2 oz 112 lb 3.2 oz  Weight (kg) 49.17 kg 51.347 kg 50.894 kg      Telemetry    Normal sinus rhythm- Personally Reviewed  ECG    No new EKG to review- Personally Reviewed  Physical Exam   GEN: No acute distress.   Neck: No JVD Cardiac: RRR, no murmurs, rubs, or gallops.  Respiratory: Clear to auscultation bilaterally. GI: Soft, nontender, non-distended  MS: No edema; No deformity. Neuro:  Nonfocal  Psych: Normal affect   Labs    High Sensitivity Troponin:  No results for input(s): TROPONINIHS in the last 720 hours.    Chemistry Recent Labs   Lab 08/11/24 1648 08/12/24 0445 08/13/24 0357  NA 132* 136 131*  K 4.3 4.1 4.1  CL 99 101 100  CO2 21* 22 20*  GLUCOSE 95 102* 100*  BUN 12 11 18   CREATININE 0.79 0.74 0.72  CALCIUM  9.4 9.3 8.8*  PROT  --  5.9*  --   ALBUMIN  --  4.1  --   AST  --  23  --   ALT  --  18  --   ALKPHOS  --  40  --   BILITOT  --  0.6  --   GFRNONAA >60 >60 >60  ANIONGAP 13 13 11      Hematology Recent Labs  Lab 08/11/24 1648 08/12/24 0445  WBC 4.9 4.0  RBC 4.10 4.17  HGB 13.2 13.4  HCT 40.6 41.4  MCV 99.0 99.3  MCH 32.2 32.1  MCHC 32.5 32.4  RDW 14.5 14.5  PLT 209 198    BNPNo results for input(s): BNP, PROBNP in the last 168 hours.   DDimer No results for input(s): DDIMER  in the last 168 hours.    Radiology    ECHOCARDIOGRAM COMPLETE Result Date: 08/12/2024    ECHOCARDIOGRAM REPORT   Patient Name:   Debbie Houston Date of Exam: 08/12/2024 Medical Rec #:  [id number]      Height:       65.0 in Accession #:    [id number]     Weight:       108.4 lb Date of Birth:  [birth year]      BSA:          1.525 m Patient Age:    82 years       BP:           158/97 mmHg Patient Gender: F              HR:           81 bpm. Exam Location:  Inpatient Procedure: 2D Echo (Both Spectral and Color Flow Doppler were utilized during            procedure). Indications:    Chest pain  History:        Patient has prior history of Echocardiogram examinations. Risk                 Factors:Hypertension.  Sonographer:    Charmaine Gaskins Referring Phys: [phone] Gerturde Kuba R Woodfin Kiss IMPRESSIONS  1. Left ventricular ejection fraction, by estimation, is 60 to 65%. The left ventricle has normal function. The left ventricle has no regional wall motion abnormalities. Left ventricular diastolic parameters are consistent with Grade I diastolic dysfunction (impaired relaxation).  2. Right ventricular systolic function is normal. The right ventricular size is normal.  3. The mitral valve is normal in structure. No evidence of mitral  valve regurgitation. No evidence of mitral stenosis.  4. The aortic valve has an indeterminant number of cusps. Aortic valve regurgitation is not visualized. No aortic stenosis is present.  5. The inferior vena cava is normal in size with greater than 50% respiratory variability, suggesting right atrial pressure of 3 mmHg. Comparison(s): No prior Echocardiogram. FINDINGS  Left Ventricle: Left ventricular ejection fraction, by estimation, is 60 to 65%. The left ventricle has normal function. The left ventricle has no regional wall motion abnormalities. The left ventricular internal cavity size was normal in size. There is  no left ventricular hypertrophy. Left ventricular diastolic parameters are consistent with Grade I diastolic dysfunction (impaired relaxation). Right Ventricle: The right ventricular size is normal. Right ventricular systolic function is normal. Left Atrium: Left atrial size was normal in size. Right Atrium: Right atrial size was normal in size. Pericardium: There is no evidence of pericardial effusion. Mitral Valve: The mitral valve is normal in structure. Mild mitral annular calcification. No evidence of mitral valve regurgitation. No evidence of mitral valve stenosis. Tricuspid Valve: The tricuspid valve is normal in structure. Tricuspid valve regurgitation is trivial. No evidence of tricuspid stenosis. Aortic Valve: The aortic valve has an indeterminant number of cusps. Aortic valve regurgitation is not visualized. No aortic stenosis is present. Pulmonic Valve: The pulmonic valve was not well visualized. Pulmonic valve regurgitation is not visualized. No evidence of pulmonic stenosis. Aorta: The aortic root is normal in size and structure. Venous: The inferior vena cava is normal in size with greater than 50% respiratory variability, suggesting right atrial pressure of 3 mmHg. IAS/Shunts: The interatrial septum is aneurysmal. No atrial level shunt detected by color flow Doppler.  LEFT VENTRICLE  PLAX 2D LVIDd:  4.50 cm     Diastology LVIDs:         2.50 cm     LV e' medial:    4.79 cm/s LV PW:         0.70 cm     LV E/e' medial:  14.1 LV IVS:        0.70 cm     LV e' lateral:   5.11 cm/s LVOT diam:     2.10 cm     LV E/e' lateral: 13.2 LVOT Area:     3.46 cm  LV Volumes (MOD) LV vol d, MOD A2C: 31.3 ml LV vol d, MOD A4C: 45.4 ml LV vol s, MOD A2C: 13.9 ml LV vol s, MOD A4C: 18.3 ml LV SV MOD A2C:     17.4 ml LV SV MOD A4C:     45.4 ml LV SV MOD BP:      22.9 ml RIGHT VENTRICLE RV Basal diam:  2.00 cm RV Mid diam:    1.80 cm RV S prime:     10.00 cm/s LEFT ATRIUM             Index        RIGHT ATRIUM           Index LA diam:        2.10 cm 1.38 cm/m   RA Area:     10.10 cm LA Vol (A2C):   34.2 ml 22.43 ml/m  RA Volume:   18.70 ml  12.26 ml/m LA Vol (A4C):   30.8 ml 20.20 ml/m LA Biplane Vol: 33.6 ml 22.04 ml/m  MITRAL VALVE                TRICUSPID VALVE MV Area (PHT): 5.50 cm     TR Peak grad:   19.9 mmHg MV E velocity: 67.30 cm/s   TR Vmax:        223.00 cm/s MV A velocity: 114.00 cm/s MV E/A ratio:  0.59         SHUNTS                             Systemic Diam: 2.10 cm Redell Shallow MD Electronically signed by Redell Shallow MD Signature Date/Time: 08/12/2024/4:27:06 PM    Final    DG Chest 2 View Result Date: 08/11/2024 EXAM: 2 VIEW(S) XRAY OF THE CHEST 08/11/2024 04:32:58 PM COMPARISON: 06/22/2022 CLINICAL HISTORY: chest pain. Patient c/o chest pain since this morning. FINDINGS: LUNGS AND PLEURA: No focal pulmonary opacity. No pulmonary edema. No pleural effusion. No pneumothorax. HEART AND MEDIASTINUM: No acute abnormality of the cardiac and mediastinal silhouettes. BONES AND SOFT TISSUES: No acute osseous abnormality. IMPRESSION: 1. Normal chest radiograph without acute cardiopulmonary process. Electronically signed by: Waddell Calk MD 08/11/2024 04:57 PM EDT RP Workstation: [id number]    Patient Profile     83 y.o. female  with a hx of HTN, labile HTN, HLD, prior cellulitis,  hypothyroidism, RBBB, GERD, ulcerative colitis, neuropathy, and former smoker who is being seen 08/12/2024 for the evaluation of chest pain at the request of DR. [place].   Assessment & Plan    Atypical chest pain Elevated troponin Hyperlipidemia - No history of CAD and no evidence of coronary calcifications on chest CT 2023 - Initially reported as exertional chest pain but patient denies any exertional component to her chest pain  - chest pain ONLY OCCURS when lying flat which would resolve when  sitting up or taking Tums and belching>> suspect this is GERD - Nuclear stress test in 2023 was normal - 2D echo: EF 60 to 65%, G1 DD, normal RV, RAP 3 mmHg - Minimally elevated troponin at 43 and 36 not consistent with ACS - Will get outpatient coronary CTA to define coronary anatomy and rule out CAD - Continue aspirin  81 mg daily, simvastatin  20 mg daily - Check FLP outpatient   Sinus tachycardia - Mild tachycardia on telemetry in the low 100s - TSH was very low at 0.18 and 11/08/2023 - Repeat TSH 14 and T4 1.06>> further treatment per [facility] - Restarted bisoprolol  5 mg daily which she had stopped taking outpatient  - Will set up for outpatient 2-week Zio patch to assess heart rate control and rule out other arrhythmias    Hypertension - BP is prolonged and is controlled at 127/83 mmHg today - Had been taking bisoprolol  5 mg daily but says she has not been taking this at home>>she thinks someone stopped it but dose not know who or why - Also has been taking Benicar 20 mg daily at bedtime PTA - Benicar has been replaced by irbesartan  150 mg daily while in the hospital - Continue bisoprolol  5 mg daily and resume Benicar 20 mg daily at bedtime on discharge  Dizziness - seems to be positional from sitting to standing>>c/w orthostatic hypotension - Denies any presyncope or syncope - Awaiting orthostatic blood pressure check>> if okay then okay for discharge  [facility] will sign off.    The patient is ready for discharge today from a cardiac standpoint. Medication Recommendations: Aspirin  81 mg daily, bisoprolol  5 mg daily, Benicar 20 mg at bedtime, simvastatin  20 mg daily Other recommendations (labs, testing, etc): 2-week outpatient Zio patch, fasting lipid panel, outpatient coronary CTA Follow up as an outpatient:  Dr. Michele  For questions or updates, please contact [facility] Please consult [email or web address] for contact info under        Signed, Wilbert Bihari, MD  08/13/2024, 1:48 PM

## 2024-08-13 NOTE — Discharge Summary (Signed)
 Physician Discharge Summary  FORTUNE TOROSIAN FMW:[id number] DOB: January 15, 1941 DOA: 08/11/2024  PCP: Chet Mad, DO  Admit date: 08/11/2024 Discharge date: 08/13/2024  Admitted From: Home Disposition: Home  Recommendations for Outpatient Follow-up:  Follow up with PCP in 1-2 weeks Follow-up with cardiology outpatient; scheduled appointment on 09/11/2024 Restarted bisoprolol  5 mg p.o. daily Keflex  x 3 days for suspected UTI Cardiology recommends outpatient fasting lipid panel, coronary CTA and Zio patch  Home Health: No Equipment/Devices: None  Discharge Condition: Stable CODE STATUS: Full code Diet recommendation: Heart healthy diet  History of present illness:  Debbie Houston is a 83 y.o. female with past medical history significant for HTN, HLD, hypothyroidism, GERD, microscopic colitis, neuropathy, former smoker who presented to [facility] ED on 08/11/2024 by direction of her PCP for chest pain.  Patient reports over the last 3-4 days, noted chest pain versus epigastric pain occurring 1-2 times per day with exertion.  Reports resolution after she takes Tums.  Denies associated shortness of breath.   In the ED, temperature 97.6 F, HR 82, RR 16, BP 147/103, SpO2 98% on room air.  WBC 4.9, hemoglobin 13.2, platelet count 209.  Sodium 132, potassium 4.3, chloride 99, CO2 21, glucose 95, BUN 12, creatinine 0.79.  High sensitivity troponin 43 followed by 36.  Urinalysis with small leukocytes, negative nitrite, many bacteria, 11-20 WBCs.  Chest x-ray with no acute cardiopulmonary disease process.  EKG with NSR, right bundle branch block and left posterior fascicular block.  Cardiology consulted.  TRH consulted for admission for further evaluation and management of chest pain.  Hospital course:  Chest pain, atypical Elevated troponin Patient presenting with chest pain occurring 1-2 times daily with exertion, improves with Tums.  Patient with mildly elevated troponin, 43 followed by 36 on  admission.  EKG with NSR, RBBB, left  posterior fascicular block.  TTE with LVEF 60-65%, LV with no regional wall motion normalities, grade 1 diastolic dysfunction, no AR stenosis, IVC normal in size.  Patient was restarted on bisoprolol  5 mg p.o. daily.  Suspected symptoms related to reflux and recommend continue PPI.  Continue aspirin , simvastatin .  Outpatient follow-up with cardiology with fasting lipid panel, coronary CTA, Zio patch.  Urinary tract infection Urinalysis consistent with UTI, patient does endorse increased urinary frequency.  History of E. coli.  Keflex  500 mg p.o. twice daily x 3 days.   HTN Bisoprolol  5 mg p.o. daily, Benicar 20 mg p.o. nightly   Hypothyroidism TSH elevated 14, free T41.06; within normal limits.  Continue home Levothyroxine  44 mcg Monday/Wednesday/Friday, 88 mcg Sunday/Tuesday/Thursday/Saturday   HLD Simvastatin  20 g p.o. daily   GERD Protonix  40 mg p.o. twice daily   Ulcerative colitis Follows with gastroenterology, [facility]. Continue Ertrasimod 2mg  PO every other day  Discharge Diagnoses:  Principal Problem:   Chest pain Active Problems:   History of microscopic colitis   Primary hypertension   Hyperlipidemia   Hypothyroidism   GERD   Dizziness   Hyponatremia   Elevated troponin   Sinus tachycardia    Discharge Instructions  Discharge Instructions     Call MD for:  difficulty breathing, headache or visual disturbances   Complete by: As directed    Call MD for:  extreme fatigue   Complete by: As directed    Call MD for:  persistant dizziness or light-headedness   Complete by: As directed    Call MD for:  persistant nausea and vomiting   Complete by: As directed    Call  MD for:  severe uncontrolled pain   Complete by: As directed    Call MD for:  temperature >100.4   Complete by: As directed    Diet - low sodium heart healthy   Complete by: As directed    Increase activity slowly   Complete by: As directed        Allergies as of 08/13/2024       Reactions   Remicade  [infliximab ] Other (See Comments)   Reaction not recalled   Fosamax [alendronate Sodium] Other (See Comments)   GI upset    Wellbutrin [bupropion] Hives   Clindamycin /lincomycin Rash   Singulair [montelukast Sodium] Anxiety, Other (See Comments)   Makes pt jittery   Sulfa Antibiotics Rash        Medication List     STOP taking these medications    triamcinolone  ointment 0.5 % Commonly known as: KENALOG        TAKE these medications    acetaminophen  500 MG tablet Commonly known as: TYLENOL  Take 500 mg by mouth every 8 (eight) hours as needed (for pain or headaches).   aspirin  EC 81 MG tablet Take 1 tablet (81 mg total) by mouth in the morning and at bedtime. What changed: when to take this   bisoprolol  5 MG tablet Commonly known as: ZEBETA  Take 1 tablet (5 mg total) by mouth daily. Start taking on: August 14, 2024 What changed:  how much to take how to take this when to take this additional instructions   CALCIUM  1200 PO Take 1,200 mg by mouth daily.   cephALEXin  500 MG capsule Commonly known as: KEFLEX  Take 1 capsule (500 mg total) by mouth 2 (two) times daily for 3 days.   Claritin  10 MG tablet Generic drug: loratadine  Take 10 mg by mouth in the morning.   colestipol  1 g tablet Commonly known as: COLESTID  Take 1 g by mouth See admin instructions. Take 1 gram by mouth after supper and an additional 1 gram at bedtime as needed if loose stools are present   cycloSPORINE  0.05 % ophthalmic emulsion Commonly known as: RESTASIS  Place 1 drop into both eyes 2 (two) times daily.   Fish Oil 1200 MG Caps Take 1,200 mg by mouth daily.   fluticasone 50 MCG/ACT nasal spray Commonly known as: FLONASE Place 1 spray into both nostrils in the morning.   levothyroxine  88 MCG tablet Commonly known as: SYNTHROID  Take 1 tab daily except M-W-F take 1/2 tab What changed:  how much to take how to take  this when to take this additional instructions   loperamide  2 MG capsule Commonly known as: IMODIUM  Take 2 mg by mouth 2 (two) times daily as needed for diarrhea or loose stools.   meclizine  25 MG tablet Commonly known as: ANTIVERT  1/2-1 pill up to 3 times daily for motion sickness/dizziness What changed:  how much to take how to take this when to take this reasons to take this additional instructions   olmesartan 20 MG tablet Commonly known as: BENICAR Take 20 mg by mouth at bedtime.   pantoprazole  40 MG tablet Commonly known as: Protonix  Take 1 tablet  2 x /day  for Heartburn & Indigestion What changed:  how much to take how to take this when to take this additional instructions   PROBIOTIC PO Take 1 capsule by mouth daily.   Prolia  60 MG/ML Sosy injection Generic drug: denosumab  Inject 60 mg into the skin every 6 (six) months.   pseudoephedrine  120 MG 12 hr  tablet Commonly known as: SUDAFED Take  1 tablet  2 x /day (every 12 hours)  for Head and Chest Congestion What changed:  how much to take how to take this when to take this additional instructions   simvastatin  20 MG tablet Commonly known as: ZOCOR  Take 1 tablet at Bedtime for Cholesterol What changed:  how much to take how to take this when to take this additional instructions   Systane Complete 0.6 % Soln Generic drug: Propylene Glycol Place 1 drop into both eyes 3 (three) times daily as needed (for dryness).   THERA VITAMIN PO Take 1 tablet by mouth daily with breakfast.   Tylenol  PM Extra Strength 500-25 MG Tabs tablet Generic drug: diphenhydramine -acetaminophen  Take 1 tablet by mouth at bedtime.   Velsipity  2 MG Tabs Generic drug: Etrasimod Arginine  Take 2 mg by mouth daily. What changed: when to take this   vitamin C 1000 MG tablet Take 1,000 mg by mouth daily.        Follow-up Information     Espinoza, Alejandra, DO. Schedule an appointment as soon as possible for a visit  in 1 week(s).   Specialty: Family Medicine Contact information: [address] [place] [address] [phone]         [facility] A Dept of [facility] [place] [employer]. Go to.   Specialty: Cardiology Why: please go to the new Heart and Vascular center one day for fasting blood work prior to your cardiology appointment with Rollo Louder. You do not need an appointment for the blood work. Please ensure you do not eat or drink prior to the blood work. Contact information: [address] [place]  [address] [phone]        Louder Rollo SAUNDERS, PA-C. Go on 09/11/2024.   Specialty: Cardiology Contact information: [address] [place] [address] [phone]                Allergies  Allergen Reactions   Remicade  [Infliximab ] Other (See Comments)    Reaction not recalled   Fosamax [Alendronate Sodium] Other (See Comments)    GI upset    Wellbutrin [Bupropion] Hives   Clindamycin /Lincomycin Rash   Singulair [Montelukast Sodium] Anxiety and Other (See Comments)    Makes pt jittery   Sulfa Antibiotics Rash    Consultations: Cardiology, Dr. Shlomo   Procedures/Studies: ECHOCARDIOGRAM COMPLETE Result Date: 08/12/2024    ECHOCARDIOGRAM REPORT   Patient Name:   JAYNELL CASTAGNOLA Date of Exam: 08/12/2024 Medical Rec #:  [id number]      Height:       65.0 in Accession #:    [id number]     Weight:       108.4 lb Date of Birth:  Aug 25, 1941      BSA:          1.525 m Patient Age:    82 years       BP:           158/97 mmHg Patient Gender: F              HR:           81 bpm. Exam Location:  Inpatient Procedure: 2D Echo (Both Spectral and Color Flow Doppler were utilized during            procedure). Indications:    Chest pain  History:        Patient has prior history of Echocardiogram examinations. Risk  Factors:Hypertension.  Sonographer:    Charmaine Gaskins Referring Phys: [phone] TRACI R TURNER IMPRESSIONS   1. Left ventricular ejection fraction, by estimation, is 60 to 65%. The left ventricle has normal function. The left ventricle has no regional wall motion abnormalities. Left ventricular diastolic parameters are consistent with Grade I diastolic dysfunction (impaired relaxation).  2. Right ventricular systolic function is normal. The right ventricular size is normal.  3. The mitral valve is normal in structure. No evidence of mitral valve regurgitation. No evidence of mitral stenosis.  4. The aortic valve has an indeterminant number of cusps. Aortic valve regurgitation is not visualized. No aortic stenosis is present.  5. The inferior vena cava is normal in size with greater than 50% respiratory variability, suggesting right atrial pressure of 3 mmHg. Comparison(s): No prior Echocardiogram. FINDINGS  Left Ventricle: Left ventricular ejection fraction, by estimation, is 60 to 65%. The left ventricle has normal function. The left ventricle has no regional wall motion abnormalities. The left ventricular internal cavity size was normal in size. There is  no left ventricular hypertrophy. Left ventricular diastolic parameters are consistent with Grade I diastolic dysfunction (impaired relaxation). Right Ventricle: The right ventricular size is normal. Right ventricular systolic function is normal. Left Atrium: Left atrial size was normal in size. Right Atrium: Right atrial size was normal in size. Pericardium: There is no evidence of pericardial effusion. Mitral Valve: The mitral valve is normal in structure. Mild mitral annular calcification. No evidence of mitral valve regurgitation. No evidence of mitral valve stenosis. Tricuspid Valve: The tricuspid valve is normal in structure. Tricuspid valve regurgitation is trivial. No evidence of tricuspid stenosis. Aortic Valve: The aortic valve has an indeterminant number of cusps. Aortic valve regurgitation is not visualized. No aortic stenosis is present. Pulmonic Valve: The  pulmonic valve was not well visualized. Pulmonic valve regurgitation is not visualized. No evidence of pulmonic stenosis. Aorta: The aortic root is normal in size and structure. Venous: The inferior vena cava is normal in size with greater than 50% respiratory variability, suggesting right atrial pressure of 3 mmHg. IAS/Shunts: The interatrial septum is aneurysmal. No atrial level shunt detected by color flow Doppler.  LEFT VENTRICLE PLAX 2D LVIDd:         4.50 cm     Diastology LVIDs:         2.50 cm     LV e' medial:    4.79 cm/s LV PW:         0.70 cm     LV E/e' medial:  14.1 LV IVS:        0.70 cm     LV e' lateral:   5.11 cm/s LVOT diam:     2.10 cm     LV E/e' lateral: 13.2 LVOT Area:     3.46 cm  LV Volumes (MOD) LV vol d, MOD A2C: 31.3 ml LV vol d, MOD A4C: 45.4 ml LV vol s, MOD A2C: 13.9 ml LV vol s, MOD A4C: 18.3 ml LV SV MOD A2C:     17.4 ml LV SV MOD A4C:     45.4 ml LV SV MOD BP:      22.9 ml RIGHT VENTRICLE RV Basal diam:  2.00 cm RV Mid diam:    1.80 cm RV S prime:     10.00 cm/s LEFT ATRIUM             Index        RIGHT ATRIUM  Index LA diam:        2.10 cm 1.38 cm/m   RA Area:     10.10 cm LA Vol (A2C):   34.2 ml 22.43 ml/m  RA Volume:   18.70 ml  12.26 ml/m LA Vol (A4C):   30.8 ml 20.20 ml/m LA Biplane Vol: 33.6 ml 22.04 ml/m  MITRAL VALVE                TRICUSPID VALVE MV Area (PHT): 5.50 cm     TR Peak grad:   19.9 mmHg MV E velocity: 67.30 cm/s   TR Vmax:        223.00 cm/s MV A velocity: 114.00 cm/s MV E/A ratio:  0.59         SHUNTS                             Systemic Diam: 2.10 cm Redell Shallow MD Electronically signed by Redell Shallow MD Signature Date/Time: 08/12/2024/4:27:06 PM    Final    DG Chest 2 View Result Date: 08/11/2024 EXAM: 2 VIEW(S) XRAY OF THE CHEST 08/11/2024 04:32:58 PM COMPARISON: 06/22/2022 CLINICAL HISTORY: chest pain. Patient c/o chest pain since this morning. FINDINGS: LUNGS AND PLEURA: No focal pulmonary opacity. No pulmonary edema. No pleural  effusion. No pneumothorax. HEART AND MEDIASTINUM: No acute abnormality of the cardiac and mediastinal silhouettes. BONES AND SOFT TISSUES: No acute osseous abnormality. IMPRESSION: 1. Normal chest radiograph without acute cardiopulmonary process. Electronically signed by: Waddell Calk MD 08/11/2024 04:57 PM EDT RP Workstation: [id number]     Subjective: Patient seen examined bedside, lying in bed.  Eating breakfast.  No complaints.  Denies any further chest pain.  Seen by cardiology and discharging home with outpatient follow-up.  Denies headache, no visual changes, no chest pain, no palpitations, no shortness of breath, no abdominal pain, no fever/chills/night sweats, no nausea/vomiting/diarrhea, no focal weakness, fatigue, no paresthesia.  No acute events overnight per nursing.  Discharge Exam: Vitals:   08/13/24 0632 08/13/24 1340  BP: 129/85 127/83  Pulse: 77 67  Resp:  18  Temp:    SpO2:  99%   Vitals:   08/13/24 0629 08/13/24 0630 08/13/24 0632 08/13/24 1340  BP: (!) 142/90 (!) 131/93 129/85 127/83  Pulse: 66 70 77 67  Resp: 18   18  Temp: 98 F (36.7 C)     TempSrc: Oral   Oral  SpO2: 99%   99%  Weight:      Height:        Physical Exam: GEN: NAD, alert and oriented x 3, wd/wn HEENT: NCAT, PERRL, EOMI, sclera clear, MMM PULM: CTAB w/o wheezes/crackles, normal respiratory effort, on room air CV: RRR w/o M/G/R GI: abd soft, NTND, NABS, no R/G/M MSK: no peripheral edema, muscle strength globally intact 5/5 bilateral upper/lower extremities NEURO: CN II-XII intact, no focal deficits, sensation to light touch intact PSYCH: normal mood/affect Integumentary: dry/intact, no rashes or wounds    The results of significant diagnostics from this hospitalization (including imaging, microbiology, ancillary and laboratory) are listed below for reference.     Microbiology: No results found for this or any previous visit (from the past 240 hours).   Labs: BNP (last 3  results) No results for input(s): BNP in the last 8760 hours. Basic Metabolic Panel: Recent Labs  Lab 08/11/24 1648 08/12/24 0445 08/13/24 0357  NA 132* 136 131*  K 4.3 4.1 4.1  CL 99 101 100  CO2 21* 22 20*  GLUCOSE 95 102* 100*  BUN 12 11 18   CREATININE 0.79 0.74 0.72  CALCIUM  9.4 9.3 8.8*   Liver Function Tests: Recent Labs  Lab 08/12/24 0445  AST 23  ALT 18  ALKPHOS 40  BILITOT 0.6  PROT 5.9*  ALBUMIN 4.1   No results for input(s): LIPASE, AMYLASE in the last 168 hours. No results for input(s): AMMONIA in the last 168 hours. CBC: Recent Labs  Lab 08/11/24 1648 08/12/24 0445  WBC 4.9 4.0  HGB 13.2 13.4  HCT 40.6 41.4  MCV 99.0 99.3  PLT 209 198   Cardiac Enzymes: No results for input(s): CKTOTAL, CKMB, CKMBINDEX, TROPONINI in the last 168 hours. BNP: Invalid input(s): POCBNP CBG: No results for input(s): GLUCAP in the last 168 hours. D-Dimer No results for input(s): DDIMER in the last 72 hours. Hgb A1c No results for input(s): HGBA1C in the last 72 hours. Lipid Profile Recent Labs    08/13/24 0357  CHOL 182  HDL 68  LDLCALC 102*  TRIG 61  CHOLHDL 2.7   Thyroid  function studies Recent Labs    08/13/24 0357  TSH 14.000*   Anemia work up No results for input(s): VITAMINB12, FOLATE, FERRITIN, TIBC, IRON, RETICCTPCT in the last 72 hours. Urinalysis    Component Value Date/Time   COLORURINE YELLOW 08/11/2024 1957   APPEARANCEUR HAZY (A) 08/11/2024 1957   LABSPEC 1.010 08/11/2024 1957   PHURINE 7.0 08/11/2024 1957   GLUCOSEU NEGATIVE 08/11/2024 1957   HGBUR NEGATIVE 08/11/2024 1957   BILIRUBINUR NEGATIVE 08/11/2024 1957   KETONESUR NEGATIVE 08/11/2024 1957   PROTEINUR NEGATIVE 08/11/2024 1957   UROBILINOGEN 0.2 05/05/2015 1030   NITRITE NEGATIVE 08/11/2024 1957   LEUKOCYTESUR SMALL (A) 08/11/2024 1957   Sepsis Labs Recent Labs  Lab 08/11/24 1648 08/12/24 0445  WBC 4.9 4.0   Microbiology No  results found for this or any previous visit (from the past 240 hours).   Time coordinating discharge: Over 30 minutes  SIGNED:   Camellia PARAS [place], DO  [facility] 08/13/2024, 2:37 PM

## 2024-08-14 LAB — T3: T3, Total: 67 ng/dL — ABNORMAL LOW (ref 71–180)

## 2024-08-18 DIAGNOSIS — I10 Essential (primary) hypertension: Secondary | ICD-10-CM | POA: Diagnosis not present

## 2024-08-18 DIAGNOSIS — N1831 Chronic kidney disease, stage 3a: Secondary | ICD-10-CM | POA: Diagnosis not present

## 2024-08-18 DIAGNOSIS — E782 Mixed hyperlipidemia: Secondary | ICD-10-CM | POA: Diagnosis not present

## 2024-08-18 DIAGNOSIS — E039 Hypothyroidism, unspecified: Secondary | ICD-10-CM | POA: Diagnosis not present

## 2024-08-18 NOTE — Telephone Encounter (Signed)
 [employer]  Patient is ready for scheduling on or after: 09/10/24  Out-of-pocket cost due at time of visit: $0  Primary: [employer]  co-insurance: 20% (approximately $350.84) once $3400 OOP is met, coverage goes to 100%- ($1,808.05 met) Admin fee co-insurance: 20% (approximately $20)  Deductible: n/a  Prior Auth: NOT required  Secondary: [employer] of [place] med supp this plan is a Medicare Supplement Plan F and it covers the Medicare Part B deductible, co-insurance and 100% of the excess Charges.  ** This summary of benefits is an estimation of the patient's out-of-pocket cost. Exact cost may vary based on individual plan coverage.

## 2024-08-21 ENCOUNTER — Other Ambulatory Visit: Payer: Self-pay | Admitting: Student

## 2024-08-21 DIAGNOSIS — Z09 Encounter for follow-up examination after completed treatment for conditions other than malignant neoplasm: Secondary | ICD-10-CM | POA: Diagnosis not present

## 2024-08-21 DIAGNOSIS — R42 Dizziness and giddiness: Secondary | ICD-10-CM | POA: Diagnosis not present

## 2024-08-21 DIAGNOSIS — I10 Essential (primary) hypertension: Secondary | ICD-10-CM | POA: Diagnosis not present

## 2024-08-21 DIAGNOSIS — K219 Gastro-esophageal reflux disease without esophagitis: Secondary | ICD-10-CM | POA: Diagnosis not present

## 2024-08-24 ENCOUNTER — Ambulatory Visit
Admission: RE | Admit: 2024-08-24 | Discharge: 2024-08-24 | Disposition: A | Discharge status: Home / Self Care | Source: Ambulatory Visit | Attending: Student | Admitting: Student

## 2024-08-24 DIAGNOSIS — R42 Dizziness and giddiness: Secondary | ICD-10-CM | POA: Diagnosis not present

## 2024-08-24 DIAGNOSIS — S0990XA Unspecified injury of head, initial encounter: Secondary | ICD-10-CM | POA: Diagnosis not present

## 2024-08-25 ENCOUNTER — Ambulatory Visit: Admitting: Podiatry

## 2024-08-25 ENCOUNTER — Encounter: Payer: Self-pay | Admitting: Podiatry

## 2024-08-25 DIAGNOSIS — B351 Tinea unguium: Secondary | ICD-10-CM

## 2024-08-25 MED ORDER — TERBINAFINE HCL 250 MG PO TABS: 250.0000 mg | ORAL_TABLET | Freq: Every day | ORAL | 0 refills | Status: DC

## 2024-08-25 NOTE — Progress Notes (Signed)
  Subjective:  Patient ID: Debbie Houston, female    DOB: 1941-05-22,   MRN: [id number]  Chief Complaint  Patient presents with   Nail Problem    I'm supposed to come back for my fungus.    83 y.o. female presents for follow-up of fungal nails and to discuss treatments. . Denies any other pedal complaints. Denies n/v/f/c.   Past Medical History:  Diagnosis Date   Arthritis    Benign labile hypertension    Cancer (HCC)    skin cancer on face   Cellulitis    Colitis    DJD (degenerative joint disease)    Endometrial polyp    GERD (gastroesophageal reflux disease)    History of basal cell carcinoma excision    History of esophagitis    HOH (hard of hearing)    right ear better than left  -- wears no aides   Hyperlipidemia    Hypothyroidism    Neuromuscular disorder (HCC)    neuropathy in feet   Osteopenia    Prediabetes    S/P right hip fracture 08/17/2020   Sepsis (HCC) 2023   per patient   Spinal stenosis 2025   per patient   Vitamin D  deficiency    Wears glasses     Objective:  Physical Exam: Vascular: DP/PT pulses 2/4 bilateral. CFT <3 seconds. Normal hair growth on digits. No edema.  Skin. No lacerations or abrasions bilateral feet. Bilateral second digit nails are thickened and dystrophic with subungual debris.  Musculoskeletal: MMT 5/5 bilateral lower extremities in DF, PF, Inversion and Eversion. Deceased ROM in DF of ankle joint.  Neurological: Sensation intact to light touch.   Assessment:   1. Onychomycosis       Plan:  Patient was evaluated and treated and all questions answered. -Examined patient -Discussed treatment options for painful dystrophic nails  -Culture positive for candida  -Discussed fungal nail treatment options including oral, topical, and laser treatments.  -Patient would like to try lamisil. Asking for resend of prescription never picked up.  -LFTS wnl.  -Patient to return in 4 weeks for follow up evaluation and discussion of  fungal culture results or sooner if symptoms worsen.    Asberry Failing, DPM

## 2024-08-31 DIAGNOSIS — R42 Dizziness and giddiness: Secondary | ICD-10-CM | POA: Diagnosis not present

## 2024-08-31 NOTE — Progress Notes (Signed)
 Cardiology Office Note   Date:  09/11/2024  ID:  [name], DOB March 09, 1941, MRN [id number] PCP: Chet Mad, DO  [facility] Providers Cardiologist:  Madonna Large, DO   History of Present Illness Debbie Houston is a 83 y.o. female with past medical history of hypertension/labile hypertension, hyperlipidemia, prior cellulitis, hypothyroidism, RBBB, GERD, ulcerative colitis, neuropathy, former tobacco use.  Patient followed by Dr. Large and presents today for hospital follow-up appointment.  Patient was initially referred to cardiology for preoperative evaluation prior to hip replacement.  Echocardiogram 10/2021 showed EF 60 to 65%, normal global wall motion, no significant valvular abnormalities.  Stress test in 11/2021 showed normal myocardial perfusion.  Had a CT chest in 04/2022 that did not show significant coronary calcifications.  She was recently admitted in 07/2024.  She had presented to the [facility] long ED with chest pain and dizziness.  High-sensitivity troponin 43> 36.  EKG with RBBB and LPFB.  Cardiology was consulted.  Reported that her chest pain was worse when laying flat at night to sleep and resolved with Tums or if she would sit up.  She was very active in her daily life taking care of her husband and adult child with special needs, able to complete more than 4.0 METS without angina.  Underwent echocardiogram 08/12/2024 that showed EF 60 to 65% no regional wall motion abnormalities, grade 1 DD, normal RV systolic function, no significant valvular abnormalities.  She was discharged with plans for an outpatient coronary CTA. This is scheduled for next week   During that admission, patient also reported some positional dizziness.  Her BP medications were adjusted.  She had sinus tachycardia during her admission.  Restarted back on bisoprolol  5 mg daily which she had stopped taking as an outpatient.  Was discharged with a 2-week monitor which showed predominantly normal  sinus rhythm, 2 episodes of SVT with longest lasting 7 beats, rare PVCs and PACs   Today, patient presents for hospital follow-up appointment.  She tells me that she has been doing very well from a cardiac perspective.  She has not had chest pain since being seen in the ED.  Breathing is stable.  She occasionally has a fluttering feeling in her chest.  This is very mild.  Denies syncope, near syncope.  She is a bit dizzy at times, thinks that this is related to some of the medications that she is on for her ulcerative colitis.  She had an infection in her nose and was started on Keflex , had side effects with this medication and is now on doxycycline .  Tolerating doxycycline  well and feels her infection is clearing up.  We reviewed the results of her studies during her recent hospitalization.  All questions were answered.  Studies Reviewed     Cardiac Studies & Procedures   ______________________________________________________________________________________________   STRESS TESTS  PCV MYOCARDIAL PERFUSION WITH LEXISCAN  12/04/2021  Interpretation Summary Lexiscan  (with Mod Bruce protocol) Nuclear stress test 12/04/2020: Nondiagnostic ECG stress. The heart rate response was consistent with walking Lexiscan  stress and achieved 84% of MPHR. Myocardial perfusion is normal. Overall LV systolic function is normal without regional wall motion abnormalities. Stress LV EF: 52%. No previous exam available for comparison. Low risk.   ECHOCARDIOGRAM  ECHOCARDIOGRAM COMPLETE 08/12/2024  Narrative ECHOCARDIOGRAM REPORT    Patient Name:   Debbie Houston Date of Exam: 08/12/2024 Medical Rec #:  [id number]      Height:       65.0 in Accession #:  [id number]     Weight:       108.4 lb Date of Birth:  Apr 14, 1941      BSA:          1.525 m Patient Age:    82 years       BP:           158/97 mmHg Patient Gender: F              HR:           81 bpm. Exam Location:  Inpatient  Procedure: 2D Echo (Both  Spectral and Color Flow Doppler were utilized during procedure).  Indications:    Chest pain  History:        Patient has prior history of Echocardiogram examinations. Risk Factors:Hypertension.  Sonographer:    Charmaine Gaskins Referring Phys: [phone] TRACI R TURNER  IMPRESSIONS   1. Left ventricular ejection fraction, by estimation, is 60 to 65%. The left ventricle has normal function. The left ventricle has no regional wall motion abnormalities. Left ventricular diastolic parameters are consistent with Grade I diastolic dysfunction (impaired relaxation). 2. Right ventricular systolic function is normal. The right ventricular size is normal. 3. The mitral valve is normal in structure. No evidence of mitral valve regurgitation. No evidence of mitral stenosis. 4. The aortic valve has an indeterminant number of cusps. Aortic valve regurgitation is not visualized. No aortic stenosis is present. 5. The inferior vena cava is normal in size with greater than 50% respiratory variability, suggesting right atrial pressure of 3 mmHg.  Comparison(s): No prior Echocardiogram.  FINDINGS Left Ventricle: Left ventricular ejection fraction, by estimation, is 60 to 65%. The left ventricle has normal function. The left ventricle has no regional wall motion abnormalities. The left ventricular internal cavity size was normal in size. There is no left ventricular hypertrophy. Left ventricular diastolic parameters are consistent with Grade I diastolic dysfunction (impaired relaxation).  Right Ventricle: The right ventricular size is normal. Right ventricular systolic function is normal.  Left Atrium: Left atrial size was normal in size.  Right Atrium: Right atrial size was normal in size.  Pericardium: There is no evidence of pericardial effusion.  Mitral Valve: The mitral valve is normal in structure. Mild mitral annular calcification. No evidence of mitral valve regurgitation. No evidence of mitral valve  stenosis.  Tricuspid Valve: The tricuspid valve is normal in structure. Tricuspid valve regurgitation is trivial. No evidence of tricuspid stenosis.  Aortic Valve: The aortic valve has an indeterminant number of cusps. Aortic valve regurgitation is not visualized. No aortic stenosis is present.  Pulmonic Valve: The pulmonic valve was not well visualized. Pulmonic valve regurgitation is not visualized. No evidence of pulmonic stenosis.  Aorta: The aortic root is normal in size and structure.  Venous: The inferior vena cava is normal in size with greater than 50% respiratory variability, suggesting right atrial pressure of 3 mmHg.  IAS/Shunts: The interatrial septum is aneurysmal. No atrial level shunt detected by color flow Doppler.   LEFT VENTRICLE PLAX 2D LVIDd:         4.50 cm     Diastology LVIDs:         2.50 cm     LV e' medial:    4.79 cm/s LV PW:         0.70 cm     LV E/e' medial:  14.1 LV IVS:        0.70 cm     LV e' lateral:  5.11 cm/s LVOT diam:     2.10 cm     LV E/e' lateral: 13.2 LVOT Area:     3.46 cm  LV Volumes (MOD) LV vol d, MOD A2C: 31.3 ml LV vol d, MOD A4C: 45.4 ml LV vol s, MOD A2C: 13.9 ml LV vol s, MOD A4C: 18.3 ml LV SV MOD A2C:     17.4 ml LV SV MOD A4C:     45.4 ml LV SV MOD BP:      22.9 ml  RIGHT VENTRICLE RV Basal diam:  2.00 cm RV Mid diam:    1.80 cm RV S prime:     10.00 cm/s  LEFT ATRIUM             Index        RIGHT ATRIUM           Index LA diam:        2.10 cm 1.38 cm/m   RA Area:     10.10 cm LA Vol (A2C):   34.2 ml 22.43 ml/m  RA Volume:   18.70 ml  12.26 ml/m LA Vol (A4C):   30.8 ml 20.20 ml/m LA Biplane Vol: 33.6 ml 22.04 ml/m MITRAL VALVE                TRICUSPID VALVE MV Area (PHT): 5.50 cm     TR Peak grad:   19.9 mmHg MV E velocity: 67.30 cm/s   TR Vmax:        223.00 cm/s MV A velocity: 114.00 cm/s MV E/A ratio:  0.59         SHUNTS Systemic Diam: 2.10 cm  Redell Shallow MD Electronically signed by Redell Shallow MD Signature Date/Time: 08/12/2024/4:27:06 PM    Final          ______________________________________________________________________________________________       Risk Assessment/Calculations           Physical Exam VS:  BP 128/76   Pulse 100   Ht 5' 5 (1.651 m)   Wt 112 lb 3.2 oz (50.9 kg)   SpO2 98%   BMI 18.67 kg/m        Wt Readings from Last 3 Encounters:  09/11/24 112 lb 3.2 oz (50.9 kg)  08/12/24 108 lb 6.4 oz (49.2 kg)  03/31/24 113 lb 3.2 oz (51.3 kg)    GEN: Well nourished, well developed in no acute distress.  Sitting comfortably in the chair  NECK: No JVD  CARDIAC:  RRR, no murmurs, rubs, gallops.  RESPIRATORY:  Clear to auscultation without rales, wheezing or rhonchi. Normal WOB on room air   ABDOMEN: Soft, non-tender, non-distended EXTREMITIES:  No edema in BLE; No deformity   ASSESSMENT AND PLAN  Chest pain  GERD  - Patient was seen in the ED 9/24 with chest pain.  Pain occurred when laying flat.  Improved with Tums or sitting upright.  High-sensitivity troponin 43> 36 - Echocardiogram 9/24 showed EF 60-65%, no regional wall motion abnormalities, grade 1 DD, normal RV systolic function, no significant valvular abnormalities - Previously had a normal stress test in 11/2021.  Was discharged with plans for outpatient coronary CTA. This is scheduled for next week  - Patient has not had chest pain since being seen in the ED. No DOE  - Reviewed indications for coronary CTA. Ordered metoprolol  for HR control prior to scan  - Suspected chest pain was due to GERD as it only occurred when laying flat and resolved with  Tums or belching.  Encouraged outpatient follow-up PCP for treatment. Continue protonix   - Continue aspirin  81 mg daily, simvastatin  20 mg daily, bisoprolol  5 mg daily   HTN  - BP well controlled. No symptoms concerning for orthostatic hypotension  - Continue bisoprolol  5 mg daily, olmesartan 20 mg daily - K4.1, creatinine 0.72 on  9/25  Sinus Tachycardia  - In the ED, patient noted to have sinus tachycardia with heart rate in the low 100s.  She had stopped taking her bisoprolol  5 mg daily at home, this was resumed -TSH elevated but free T4 normal on 9/25  - Outpatient monitor postdischarge showed predominantly normal sinus rhythm with average HR 68 BPM. 2 episodes of SVT with the longest lasing 7 beats. Rare PVCs and PACs  - Patient has rare, mild palpitations that do not bother her much  - Continue bisoprolol  5 mg daily   Dizziness  - Patient reported dizziness when seen in the ED 9/24 - 9/25.  Symptoms seem to be positional.  Orthostatic vital signs negative. Patient thinks this is related to her GI medications   HLD  - Lipid panel 08/13/2024 showed LDL 102, HDL 68, total cholesterol 817, triglycerides 61 - Continue simvastatin  20 mg daily   Dispo: Follow up with me in 3 months   Signed, Rollo FABIENE Louder, PA-C

## 2024-09-02 DIAGNOSIS — N1831 Chronic kidney disease, stage 3a: Secondary | ICD-10-CM | POA: Diagnosis not present

## 2024-09-02 DIAGNOSIS — I10 Essential (primary) hypertension: Secondary | ICD-10-CM | POA: Diagnosis not present

## 2024-09-07 ENCOUNTER — Telehealth (HOSPITAL_COMMUNITY): Payer: Self-pay | Admitting: Emergency Medicine

## 2024-09-07 DIAGNOSIS — Z9849 Cataract extraction status, unspecified eye: Secondary | ICD-10-CM | POA: Diagnosis not present

## 2024-09-07 DIAGNOSIS — M542 Cervicalgia: Secondary | ICD-10-CM | POA: Diagnosis not present

## 2024-09-07 DIAGNOSIS — H43393 Other vitreous opacities, bilateral: Secondary | ICD-10-CM | POA: Diagnosis not present

## 2024-09-07 DIAGNOSIS — H52223 Regular astigmatism, bilateral: Secondary | ICD-10-CM | POA: Diagnosis not present

## 2024-09-07 DIAGNOSIS — I10 Essential (primary) hypertension: Secondary | ICD-10-CM | POA: Diagnosis not present

## 2024-09-07 DIAGNOSIS — H04123 Dry eye syndrome of bilateral lacrimal glands: Secondary | ICD-10-CM | POA: Diagnosis not present

## 2024-09-07 DIAGNOSIS — H5203 Hypermetropia, bilateral: Secondary | ICD-10-CM | POA: Diagnosis not present

## 2024-09-07 DIAGNOSIS — E039 Hypothyroidism, unspecified: Secondary | ICD-10-CM | POA: Diagnosis not present

## 2024-09-07 DIAGNOSIS — G8929 Other chronic pain: Secondary | ICD-10-CM | POA: Diagnosis not present

## 2024-09-07 DIAGNOSIS — H35033 Hypertensive retinopathy, bilateral: Secondary | ICD-10-CM | POA: Diagnosis not present

## 2024-09-07 DIAGNOSIS — H53143 Visual discomfort, bilateral: Secondary | ICD-10-CM | POA: Diagnosis not present

## 2024-09-07 DIAGNOSIS — J3489 Other specified disorders of nose and nasal sinuses: Secondary | ICD-10-CM | POA: Diagnosis not present

## 2024-09-07 DIAGNOSIS — Z961 Presence of intraocular lens: Secondary | ICD-10-CM | POA: Diagnosis not present

## 2024-09-07 DIAGNOSIS — H524 Presbyopia: Secondary | ICD-10-CM | POA: Diagnosis not present

## 2024-09-07 DIAGNOSIS — E871 Hypo-osmolality and hyponatremia: Secondary | ICD-10-CM | POA: Diagnosis not present

## 2024-09-07 NOTE — Telephone Encounter (Signed)
 Reaching out to patient to offer assistance regarding upcoming cardiac imaging study; pt verbalizes understanding of appt date/time, parking situation and where to check in, pre-test NPO status and medications ordered, and verified current allergies; name and call back number provided for further questions should they arise Rockwell Alexandria RN Navigator Cardiac Imaging [facility] [phone] office [phone] cell

## 2024-09-08 DIAGNOSIS — R000 Tachycardia, unspecified: Secondary | ICD-10-CM | POA: Diagnosis not present

## 2024-09-09 ENCOUNTER — Ambulatory Visit (HOSPITAL_COMMUNITY): Admission: RE | Admit: 2024-09-09 | Source: Ambulatory Visit

## 2024-09-10 ENCOUNTER — Ambulatory Visit: Admitting: Family Medicine

## 2024-09-10 VITALS — Ht 65.0 in

## 2024-09-10 DIAGNOSIS — M810 Age-related osteoporosis without current pathological fracture: Secondary | ICD-10-CM

## 2024-09-10 MED ORDER — DENOSUMAB 60 MG/ML ~~LOC~~ SOSY
60.0000 mg | PREFILLED_SYRINGE | Freq: Once | SUBCUTANEOUS | Status: AC
  Administered 2024-09-10: 60 mg via SUBCUTANEOUS

## 2024-09-10 NOTE — Progress Notes (Unsigned)
 Patient given Roma prolia  injection 60mg /ml in the left arm. Patient tolerated injection well without reaction at the injection site. Patient will schedule next injection, which is 6 months from today. Pt given labs to be done before her injection in April.

## 2024-09-11 ENCOUNTER — Encounter: Payer: Self-pay | Admitting: Cardiology

## 2024-09-11 ENCOUNTER — Ambulatory Visit: Attending: Cardiology | Admitting: Cardiology

## 2024-09-11 VITALS — BP 128/76 | HR 100 | Ht 65.0 in | Wt 112.2 lb

## 2024-09-11 DIAGNOSIS — E782 Mixed hyperlipidemia: Secondary | ICD-10-CM | POA: Diagnosis not present

## 2024-09-11 DIAGNOSIS — R000 Tachycardia, unspecified: Secondary | ICD-10-CM | POA: Diagnosis not present

## 2024-09-11 DIAGNOSIS — I10 Essential (primary) hypertension: Secondary | ICD-10-CM

## 2024-09-11 DIAGNOSIS — R072 Precordial pain: Secondary | ICD-10-CM | POA: Diagnosis not present

## 2024-09-11 MED ORDER — METOPROLOL TARTRATE 100 MG PO TABS: ORAL_TABLET | ORAL | 0 refills | Status: AC

## 2024-09-11 NOTE — Patient Instructions (Signed)
 Medication Instructions:  Your physician recommends that you continue on your current medications as directed. Please refer to the Current Medication list given to you today.  *If you need a refill on your cardiac medications before your next appointment, please call your pharmacy*   Testing/Procedures:   Your cardiac CT will be scheduled at one of the below locations:    [facility] [address]  [place], [place] [address]  If scheduled at the [facility] at [address], please enter the parking lot using the [address] entrance and use the FREE valet service at the patient drop-off area. Enter the building and check-in with registration on the main floor.   Please follow these instructions carefully (unless otherwise directed):  An IV will be required for this test and Nitroglycerin will be given.  Hold all erectile dysfunction medications at least 3 days (72 hrs) prior to test. (Ie viagra, cialis, sildenafil, tadalafil, etc)   On the Night Before the Test: Be sure to Drink plenty of water . Do not consume any caffeinated/decaffeinated beverages or chocolate 12 hours prior to your test. Do not take any antihistamines 12 hours prior to your test.  If the patient has contrast allergy: Patient will need a prescription for Prednisone  and very clear instructions (as follows): Prednisone  50 mg - take 13 hours prior to test Take another Prednisone  50 mg 7 hours prior to test Take another Prednisone  50 mg 1 hour prior to test Take Benadryl  50 mg 1 hour prior to test Patient must complete all four doses of above prophylactic medications. Patient will need a ride after test due to Benadryl .  On the Day of the Test: Drink plenty of water  until 1 hour prior to the test. Do not eat any food 1 hour prior to test. You may take your regular medications prior to the test.  Take metoprolol  (Lopressor ) two hours prior to test. If you take  Furosemide/Hydrochlorothiazide /Spironolactone/Chlorthalidone, please HOLD on the morning of the test. Patients who wear a continuous glucose monitor MUST remove the device prior to scanning. FEMALES- please wear underwire-free bra if available, avoid dresses & tight clothing  After the Test: Drink plenty of water . After receiving IV contrast, you may experience a mild flushed feeling. This is normal. On occasion, you may experience a mild rash up to 24 hours after the test. This is not dangerous. If this occurs, you can take Benadryl  25 mg, Zyrtec, Claritin , or Allegra and increase your fluid intake. (Patients taking Tikosyn should avoid Benadryl , and may take Zyrtec, Claritin , or Allegra) If you experience trouble breathing, this can be serious. If it is severe call [phone] IMMEDIATELY. If it is mild, please call our office.  We will call to schedule your test 2-4 weeks out understanding that some insurance companies will need an authorization prior to the service being performed.   For more information and frequently asked questions, please visit our website : [email or web address]  For non-scheduling related questions, please contact the cardiac imaging nurse navigator should you have any questions/concerns: Cardiac Imaging Nurse Navigators Direct Office Dial: [phone]   For scheduling needs, including cancellations and rescheduling, please call [place], [phone].   Follow-Up: At [facility], you and your health needs are our priority.  As part of our continuing mission to provide you with exceptional heart care, our providers are all part of one team.  This team includes your primary Cardiologist (physician) and Advanced Practice Providers or APPs (Physician Assistants  and Nurse Practitioners) who all work together to provide you with the care you need, when you need it.  Your next appointment:   3 month(s)  Provider:   Rollo Louder, PA-C

## 2024-09-13 DIAGNOSIS — R000 Tachycardia, unspecified: Secondary | ICD-10-CM | POA: Diagnosis not present

## 2024-09-15 ENCOUNTER — Ambulatory Visit: Payer: Self-pay | Admitting: Cardiology

## 2024-09-16 ENCOUNTER — Ambulatory Visit (HOSPITAL_COMMUNITY)
Admission: RE | Admit: 2024-09-16 | Discharge: 2024-09-16 | Disposition: A | Discharge status: Home / Self Care | Source: Ambulatory Visit

## 2024-09-16 DIAGNOSIS — I700 Atherosclerosis of aorta: Secondary | ICD-10-CM | POA: Diagnosis not present

## 2024-09-16 DIAGNOSIS — R079 Chest pain, unspecified: Secondary | ICD-10-CM | POA: Insufficient documentation

## 2024-09-16 DIAGNOSIS — I2510 Atherosclerotic heart disease of native coronary artery without angina pectoris: Secondary | ICD-10-CM | POA: Diagnosis not present

## 2024-09-16 MED ORDER — NITROGLYCERIN 0.4 MG SL SUBL
0.8000 mg | SUBLINGUAL_TABLET | Freq: Once | SUBLINGUAL | Status: AC
  Administered 2024-09-16: 0.8 mg via SUBLINGUAL

## 2024-09-16 MED ORDER — IOHEXOL 350 MG/ML SOLN
100.0000 mL | Freq: Once | INTRAVENOUS | Status: AC | PRN
  Administered 2024-09-16: 100 mL via INTRAVENOUS

## 2024-09-17 DIAGNOSIS — R42 Dizziness and giddiness: Secondary | ICD-10-CM | POA: Diagnosis not present

## 2024-09-17 DIAGNOSIS — E039 Hypothyroidism, unspecified: Secondary | ICD-10-CM | POA: Diagnosis not present

## 2024-09-18 DIAGNOSIS — I10 Essential (primary) hypertension: Secondary | ICD-10-CM | POA: Diagnosis not present

## 2024-09-18 DIAGNOSIS — N1831 Chronic kidney disease, stage 3a: Secondary | ICD-10-CM | POA: Diagnosis not present

## 2024-09-18 DIAGNOSIS — E039 Hypothyroidism, unspecified: Secondary | ICD-10-CM | POA: Diagnosis not present

## 2024-09-18 DIAGNOSIS — E782 Mixed hyperlipidemia: Secondary | ICD-10-CM | POA: Diagnosis not present

## 2024-09-22 ENCOUNTER — Encounter: Payer: Self-pay | Admitting: Neurology

## 2024-09-22 ENCOUNTER — Ambulatory Visit (INDEPENDENT_AMBULATORY_CARE_PROVIDER_SITE_OTHER): Admitting: Neurology

## 2024-09-22 VITALS — BP 137/74 | HR 66 | Ht 65.0 in | Wt 113.0 lb

## 2024-09-22 DIAGNOSIS — G629 Polyneuropathy, unspecified: Secondary | ICD-10-CM

## 2024-09-22 DIAGNOSIS — R2681 Unsteadiness on feet: Secondary | ICD-10-CM

## 2024-09-22 NOTE — Patient Instructions (Signed)
 Start physical therapy for balance training  Start using a cane

## 2024-09-22 NOTE — Progress Notes (Signed)
 Follow-up Visit   Date: 09/22/2024    Debbie Houston MRN: [id number] DOB: 14-Oct-1941    Debbie Houston is a 83 y.o. left-handed Caucasian female with ulcerative colitis, hyperlipidemia, GERD, asthma, hypothyroidism, OA, cervical spondylosis, lumbar spondylolisthesis at L4-5 (followed by Dr. Colon)  returning to the clinic for follow-up of bilateral hand and feet pain/paresthesias.  The patient was accompanied to the clinic by self.  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 Neurological exam is stable.  CSF testing for polyradiculoneuropathy returned normal.  Symptoms are predominately numbness, therefore no role for medications.  She has previously tried gabapentin  and cymbalta  without benefit.  - Start PT for balance - Start using cane  - Patient educated on daily foot inspection, fall prevention, and safety precautions around the home.  Return to clinic in 1 year   --------------------------------------------- History of present illness: She received Remicade  infusion for Crohn's colitis in July and following this developed UTI.  She was hospitalized in August for urosepsis.  While hospitalized, she began having numbness, tingling, and sharp shooting pain over the hands and feet.  Numbness is constant and she has episodic sharp pain.  She tried gabapentin  200mg  but stopped this due to dizziness.   She complains of weakness with difficulty with opening jars/grip.  She has some imbalance, walks unassisted and does not use a cane. Over the past three months, symptoms remain unchanged.  She denies having any numbness/tingling prior to her hospitalization.    She lives at home with husband and high-functioning autistic son.   UPDATE 10/31/2022:  She is here for follow-up and discuss results of EMG and labs.  There has been no change in the numbness of her hands. She  continues to have difficulty with holding onto objects and frequently drops them.  She also endorse imbalance and had one fall which results in bruise over her right eye.  Tingling has improved slightly on Cymbalta  30mg  and she would like to consider increasing the dose. No new complaints.    UPDATE 12/19/2022:  She is here for follow-up. CSF testing returned normal without signs of inflammation.  She continues have numbness of the hands and reports droping things frequently.   She denies significant tingling or burning of the hands.  She is tolerating Cymbalta  30mg  twice daily.  Over the past few weeks, her colitis has been giving her more problems and she is currently on a steroid taper.   UPDATE 10/22/2023:  She is here for follow-up visit.  She feels that the numbness in the hands are getting worse and fine motor tasks such as writing and putting her hearing aides is difficult.  She stopped Cymbalta  at the recommendation of GI due to issues with colitis. She denies significant pain.   She also complains of right sided dull headache and numbness, which is new.  Headaches are not worse with coughing, sneezing, or bending.   UPDATE 03/17/2024:  She is here for follow-up visit.  There has been no significant change in the numbness of the hands and feet.  She was found to lumbar canal stenosis by Dr. Orpha and will be seeing neurosurgery for further evaluation.  She has been doing PT for balance and does not noticed a marked benefit.   UPDATE 09/22/2024:  She is here for follow-up visit.  Numbness in the hands and feet is unchanged and continue to make it difficult for her to  do fine and gross motor tasks.  She tends to drop things frequently.  She has imbalance and has not been using a cane.  Fortunately, no interval falls.   Medications:  Current Outpatient Medications on File Prior to Visit  Medication Sig Dispense Refill   acetaminophen  (TYLENOL ) 500 MG tablet Take 500 mg by mouth every 8 (eight)  hours as needed (for pain or headaches).     Ascorbic Acid  (VITAMIN C) 1000 MG tablet Take 1,000 mg by mouth daily.      aspirin  EC 81 MG tablet Take 1 tablet (81 mg total) by mouth in the morning and at bedtime. (Patient taking differently: Take 81 mg by mouth at bedtime.) 60 tablet 0   bisoprolol  (ZEBETA ) 5 MG tablet Take 1 tablet (5 mg total) by mouth daily. 90 tablet 0   Calcium  Carbonate-Vit D-Min (CALCIUM  1200 PO) Take 1,200 mg by mouth daily.     CLARITIN  10 MG tablet Take 10 mg by mouth in the morning.     colestipol  (COLESTID ) 1 g tablet Take 1 g by mouth See admin instructions. Take 1 gram by mouth after supper and an additional 1 gram at bedtime as needed if loose stools are present     cycloSPORINE  (RESTASIS ) 0.05 % ophthalmic emulsion Place 1 drop into both eyes 2 (two) times daily.     Etrasimod Arginine  (VELSIPITY ) 2 MG TABS Take 2 mg by mouth daily. (Patient taking differently: Take 2 mg by mouth every other day.)     fluticasone (FLONASE) 50 MCG/ACT nasal spray Place 1 spray into both nostrils in the morning.     levothyroxine  (SYNTHROID ) 88 MCG tablet Take 1 tab daily except M-W-F take 1/2 tab (Patient taking differently: Take 44-88 mcg by mouth See admin instructions. Take 88 mcg by mouth in the morning one hour before breakfast on Sun/Tues/Thurs/Sat and 44 mcg on Mon/Wed/Fri) 45 tablet 3   loperamide  (IMODIUM ) 2 MG capsule Take 2 mg by mouth 2 (two) times daily as needed for diarrhea or loose stools.     meclizine  (ANTIVERT ) 25 MG tablet 1/2-1 pill up to 3 times daily for motion sickness/dizziness (Patient taking differently: Take 25 mg by mouth 3 (three) times daily as needed for dizziness (or motion sickness).) 30 tablet 0   Multiple Vitamin (THERA VITAMIN PO) Take 1 tablet by mouth daily with breakfast.     olmesartan (BENICAR) 20 MG tablet Take 20 mg by mouth at bedtime.     Omega-3 Fatty Acids (FISH OIL) 1200 MG CAPS Take 1,200 mg by mouth daily.      pantoprazole  (PROTONIX )  40 MG tablet Take 1 tablet  2 x /day  for Heartburn & Indigestion (Patient taking differently: Take 40 mg by mouth See admin instructions. Take 40 mg by mouth 30 minutes before breakfast and supper) 180 tablet 0   Probiotic Product (PROBIOTIC PO) Take 1 capsule by mouth daily.     PROLIA  60 MG/ML SOSY injection Inject 60 mg into the skin every 6 (six) months.     pseudoephedrine  (SUDAFED) 120 MG 12 hr tablet Take  1 tablet  2 x /day (every 12 hours)  for Head and Chest Congestion (Patient taking differently: Take 120 mg by mouth at bedtime.) 60 tablet 0   simvastatin  (ZOCOR ) 20 MG tablet Take 1 tablet at Bedtime for Cholesterol (Patient taking differently: Take 20 mg by mouth See admin instructions. Take 20 mg by mouth every other night) 90 tablet 3   SYSTANE COMPLETE  0.6 % SOLN Place 1 drop into both eyes 3 (three) times daily as needed (for dryness).     terbinafine (LAMISIL) 250 MG tablet Take 1 tablet (250 mg total) by mouth daily. 90 tablet 0   TYLENOL  PM EXTRA STRENGTH 500-25 MG TABS tablet Take 1 tablet by mouth at bedtime.     metoprolol  tartrate (LOPRESSOR ) 100 MG tablet Take one tablet 2 hours prior to your scan 1 tablet 0   No current facility-administered medications on file prior to visit.    Allergies:  Allergies  Allergen Reactions   Remicade  [Infliximab ] Other (See Comments)    Reaction not recalled   Fosamax [Alendronate Sodium] Other (See Comments)    GI upset    Wellbutrin [Bupropion] Hives   Clindamycin /Lincomycin Rash   Singulair [Montelukast Sodium] Anxiety and Other (See Comments)    Makes pt jittery   Sulfa Antibiotics Rash    Vital Signs:  BP 137/74   Pulse 66   Ht 5' 5 (1.651 m)   Wt 113 lb (51.3 kg)   SpO2 96%   BMI 18.80 kg/m   Neurological Exam: MENTAL STATUS including orientation to time, place, person, recent and remote memory, attention span and concentration, language, and fund of knowledge is normal.  Speech is not dysarthric.  CRANIAL NERVES:    Pupils equal round and reactive to light.  Normal conjugate, extra-ocular eye movements in all directions of gaze.  No ptosis .  Face is symmetric.   MOTOR:  Motor strength is 5/5 in all extremities, except 4/5 intrinsic hand muscles.  Generalized loss of muscle bulk with bilateral ABP atrophy, fasciculations or abnormal movements.  No pronator drift.  Tone is normal.    MSRs:  Reflexes are 2+/4 throughout, except 3+/4 at the knees.  SENSORY:  Intact to vibration throughout, except trace at the ankles bilaterally.  Rhomberg testing is mildly positive.   COORDINATION/GAIT:   Gait wide based and slightly unstable, unassisted  Data: Labs 10/22/2022:  CRP < 1.0, ESR 19, folate > 23, copper  108, SPEP with IFE poorly defined IgG and kappa  Lab Results  Component Value Date   VITAMINB12 681 07/10/2022    NCS/EMG of the arms 10/09/2022: This is a complex study.  Findings are most suggestive of a non-length dependent and chronic sensorimotor polyneuropathy, with axonal and demyelinating features. Alternatively, a polyneuropathy with superimposed median and ulnar entrapment neuropathies in the upper extremity is possible.  Nerve ultrasound recommended to further evaluate. Chronic L5 radiculopathy affecting the right lower extremity, mild.  CSF 11/23/2022:  R2 W0 G74 P46  OCB absent, MBP < 2.0, Lyme neg, ACE neg, cytology neg  MRI brain and cervical spine wo contrast 01/30/2024: 1. No acute intracranial abnormality. 2. Chronic small vessel ischemia and volume loss. 3. Mild right C4-5 and moderate left C5-6 neural foraminal stenosis. 4. No spinal canal stenosis.   Thank Debbie for allowing me to participate in patient's care.  If I can answer any additional questions, I would be pleased to do so.    Sincerely,    Ethyle Tiedt K. Tobie, DO

## 2024-09-23 ENCOUNTER — Ambulatory Visit: Payer: BLUE CROSS/BLUE SHIELD | Admitting: Nurse Practitioner

## 2024-09-23 NOTE — Progress Notes (Signed)
 Office Visit Note  Patient: Debbie Houston             Date of Birth: Oct 27, 1941           MRN: [id number]             PCP: Espinoza, Alejandra, DO Referring: Chet Mad, DO Visit Date: 10/06/2024 Occupation: Data Unavailable  Subjective:  Pain in joints  History of Present Illness: Debbie Houston is a 83 y.o. female with osteoarthritis, degenerative disc disease and positive ANA.  She returns today after her last visit in May 2025.  She gives history of dry mouth, dry eyes and arthralgias.  There is no history of joint swelling.  She continues to have pain and discomfort in her hands, neck and lower back.  She has been seeing Dr. Orpha for knee joint discomfort.  She states Dr. Orpha recommended viscosupplement injections for her knees.  She continues to have some pain in her feet due to neuropathy.  She is on Prolia  injections through Dr. Orpha.  She has been taking vitamin D .  She has not had any flares of colitis on Velsipity .    Activities of Daily Living:  Patient reports morning stiffness for all day. Patient Reports nocturnal pain.  Difficulty dressing/grooming: Denies Difficulty climbing stairs: Reports Difficulty getting out of chair: Reports Difficulty using hands for taps, buttons, cutlery, and/or writing: Reports  Review of Systems  Constitutional:  Positive for fatigue.  HENT:  Positive for mouth sores and mouth dryness.   Eyes:  Positive for dryness.  Respiratory:  Negative for shortness of breath.   Cardiovascular:  Positive for palpitations. Negative for chest pain.  Gastrointestinal:  Positive for diarrhea. Negative for blood in stool and constipation.  Endocrine: Negative for increased urination.  Genitourinary:  Negative for involuntary urination.  Musculoskeletal:  Positive for joint pain, gait problem, joint pain, myalgias, morning stiffness, muscle tenderness and myalgias. Negative for joint swelling and muscle weakness.  Skin:  Negative for  color change, rash, hair loss and sensitivity to sunlight.  Allergic/Immunologic: Positive for susceptible to infections.  Neurological:  Positive for dizziness. Negative for headaches.  Hematological:  Negative for swollen glands.  Psychiatric/Behavioral:  Negative for depressed mood and sleep disturbance. The patient is not nervous/anxious.     PMFS History:  Patient Active Problem List   Diagnosis Date Noted   Elevated troponin 08/13/2024   Sinus tachycardia 08/13/2024   Chest pain 08/11/2024   Hyponatremia 09/26/2023   Cellulitis of left buttock 09/26/2023   Proctitis 09/25/2023   History of microscopic colitis 09/25/2023   GERD (gastroesophageal reflux disease) 09/25/2023   Sepsis secondary to UTI (HCC) 06/24/2022   Extensor tenosynovitis of right wrist 06/23/2022   Thrombocytopenia 06/23/2022   Prediabetes 06/23/2022   Intertrochanteric fracture of right femur, closed, with nonunion, subsequent encounter 01/22/2022   History of ulcerative colitis 07/10/2021   Complete right bundle branch block (RBBB) 05/15/2021   Body mass index (BMI) of 19.0-19.9 in adult 05/12/2021   Bilateral nephrolithiasis 09/21/2020   Senile purpura 08/17/2020   Bilateral temporomandibular joint pain 06/15/2020   Dizziness 03/25/2019   Spondylolisthesis at L4-L5 level 11/25/2018   Primary hypertension    Hyperlipidemia    Hypothyroidism    GERD    Asthma    DJD (degenerative joint disease)    Vitamin D  deficiency    Osteoporosis with current pathological fracture    Allergy     Past Medical History:  Diagnosis Date   Arthritis  Benign labile hypertension    Cancer (HCC)    skin cancer on face   Cellulitis    Colitis    DJD (degenerative joint disease)    Endometrial polyp    GERD (gastroesophageal reflux disease)    History of basal cell carcinoma excision    History of esophagitis    HOH (hard of hearing)    right ear better than left  -- wears no aides   Hyperlipidemia     Hypothyroidism    Neuromuscular disorder (HCC)    neuropathy in feet   Osteopenia    Prediabetes    S/P right hip fracture 08/17/2020   Sepsis (HCC) 2023   per patient   Spinal stenosis 2025   per patient   Vitamin D  deficiency    Wears glasses     Family History  Problem Relation Age of Onset   Hypertension Mother    Cancer Mother        COLON   Hyperlipidemia Mother    Cancer Father        BREAST WITH BRAIN METS   Hyperlipidemia Father    Cancer Sister        COLON (TEENS), BREAST (50)   Hyperlipidemia Sister    Diabetes Brother    Hyperlipidemia Brother    Hypertension Brother    Colon cancer Brother 55   Cancer Maternal Grandmother        RENAL   Hyperlipidemia Maternal Grandmother    Diabetes Maternal Grandfather    Stroke Maternal Grandfather    Hyperlipidemia Maternal Grandfather    Hyperlipidemia Paternal Grandmother    Hyperlipidemia Paternal Grandfather    Autism Son    Colitis Son    Autism spectrum disorder Son    Healthy Son    Past Surgical History:  Procedure Laterality Date   BACK SURGERY  2020   BIOPSY  09/27/2023   Procedure: BIOPSY;  Surgeon: Rosalie Kitchens, MD;  Location: [facility] ENDOSCOPY;  Service: Gastroenterology;;   CATARACT EXTRACTION, BILATERAL Bilateral 2020   CONVERSION TO TOTAL HIP Right 01/22/2022   Procedure: CONVERSION TO TOTAL HIP;  Surgeon: Edna Toribio LABOR, MD;  Location: [facility] ORS;  Service: Orthopedics;  Laterality: Right;   DILATATION & CURETTAGE/HYSTEROSCOPY WITH MYOSURE N/A 10/27/2014   Procedure: DILATATION & CURETTAGE/HYSTEROSCOPY WITH MYOSURE;  Surgeon: Norleen GORMAN Skill, MD;  Location: [facility];  Service: Gynecology;  Laterality: N/A;   DILATION AND CURETTAGE OF UTERUS     FLEXIBLE SIGMOIDOSCOPY N/A 09/27/2023   Procedure: FLEXIBLE SIGMOIDOSCOPY;  Surgeon: Rosalie Kitchens, MD;  Location: [facility] ENDOSCOPY;  Service: Gastroenterology;  Laterality: N/A;   HIP SURGERY Right 04/2020   rod and screw in right hip    MOHS SURGERY   2005   LEFT NASAL BRIDGE FOR BASAL CELL   TONSILLECTOMY  age 28   Social History   Tobacco Use   Smoking status: Former    Current packs/day: 0.00    Average packs/day: 0.3 packs/day for 15.0 years (3.8 ttl pk-yrs)    Types: Cigarettes    Start date: 37    Quit date: 2002    Years since quitting: 23.8    Passive exposure: Never   Smokeless tobacco: Never   Tobacco comments:    Former light smoker, quit 15+ years ago, can't recall  Vaping Use   Vaping status: Never Used  Substance Use Topics   Alcohol  use: Yes    Comment: wine occ   Drug use: No   Social  History   Social History Narrative   Are you right handed or left handed? Left Handed    Are you currently employed ? No   What is your current occupation? No. Use to teacher [employer]    Do you live at home alone? No   Who lives with you? Lives with husband    What type of home do you live in: 1 story or 2 story? One story home                Immunization History  Administered Date(s) Administered   INFLUENZA, HIGH DOSE SEASONAL PF 07/23/2014, 08/23/2015, 07/10/2016, 08/27/2017, 08/22/2018, 08/25/2019, 08/30/2020, 08/21/2021, 08/21/2022, 08/05/2023   Moderna SARS-COV2 Booster Vaccination 08/21/2022   PFIZER(Purple Top)SARS-COV-2 Vaccination 12/10/2019, 12/31/2019, 08/16/2020, 04/14/2021   Pfizer(Comirnaty)Fall Seasonal Vaccine 12 years and older 08/06/2024   Pneumococcal Conjugate-13 05/05/2015   Pneumococcal-Unspecified 08/22/2011   Tdap 08/22/2011   Unspecified SARS-COV-2 Vaccination 08/05/2023   Zoster Recombinant(Shingrix) 09/13/2017, 01/01/2018   Zoster, Live 06/20/2006     Objective: Vital Signs: BP 136/75   Pulse 66   Temp 97.6 F (36.4 C)   Resp 14   Ht 5' 5 (1.651 m)   Wt 112 lb (50.8 kg)   BMI 18.64 kg/m    Physical Exam Vitals and nursing note reviewed.  Constitutional:      Appearance: She is well-developed.  HENT:     Head: Normocephalic and atraumatic.  Eyes:      Conjunctiva/sclera: Conjunctivae normal.  Cardiovascular:     Rate and Rhythm: Normal rate and regular rhythm.     Heart sounds: Normal heart sounds.  Pulmonary:     Effort: Pulmonary effort is normal.     Breath sounds: Normal breath sounds.  Abdominal:     General: Bowel sounds are normal.     Palpations: Abdomen is soft.  Musculoskeletal:     Cervical back: Normal range of motion.  Lymphadenopathy:     Cervical: No cervical adenopathy.  Skin:    General: Skin is warm and dry.     Capillary Refill: Capillary refill takes less than 2 seconds.  Neurological:     Mental Status: She is alert and oriented to person, place, and time.  Psychiatric:        Behavior: Behavior normal.      Musculoskeletal Exam: She had limited range of motion of the cervical spine.  She had limited range of motion of the thoracic and lumbar spine.  Shoulders, elbows, wrist joints with good range of motion.  She had bilateral CMC, PIP and DIP thickening.  Subluxation of most of her DIPs was noted.  Right hip joint was replaced and hips were in good range of motion.  Knee joints were in good range of motion without any warmth swelling or effusion.  There was no tenderness over ankles or MTPs.  CDAI Exam: CDAI Score: -- Patient Global: --; Provider Global: -- Swollen: --; Tender: -- Joint Exam 10/06/2024   No joint exam has been documented for this visit   There is currently no information documented on the homunculus. Go to the Rheumatology activity and complete the homunculus joint exam.  Investigation: No additional findings.  Imaging: CT CORONARY MORPH W/CTA COR W/SCORE W/CA W/CM &/OR WO/CM Addendum Date: 09/24/2024 ADDENDUM REPORT: 09/24/2024 16:40 EXAM: OVER-READ INTERPRETATION  CT CHEST The following report is an over-read performed by radiologist Dr. Andrea Gasman of [facility] on 09/24/2024. This over-read does not include interpretation of cardiac or coronary anatomy or  pathology. The coronary CTA interpretation by the cardiologist is attached. COMPARISON:  Chest CT 04/24/2022 FINDINGS: Vascular: Aortic atherosclerosis. The included aorta is normal in caliber. Mediastinum/nodes: No adenopathy or mass.  Patulous esophagus. Lungs: Scattered areas of subsegmental atelectasis/scarring. Occasional areas of mucoid impaction. Small pulmonary nodules on prior are unchanged, including right upper lobe series 304, image 45, 47, and 53. No pleural fluid. Upper abdomen: No acute or unexpected findings. Incidental calcified lymph node in the upper abdomen typically sequela of prior granulomatous disease. Musculoskeletal: There are no acute or suspicious osseous abnormalities. Thoracic spondylosis. IMPRESSION: 1. Small pulmonary nodules on prior are unchanged from 2023, considered benign, needing no further imaging follow-up. 2. Aortic Atherosclerosis (ICD10-I70.0). Electronically Signed   By: Andrea Gasman M.D.   On: 09/24/2024 16:40   Result Date: 09/24/2024 CLINICAL DATA:  83 yo female with chest pain/anginal equiv, ECGs or troponins abnormal EXAM: Cardiac/Coronary CTA TECHNIQUE: A non-contrast, gated CT scan was obtained with axial slices of 2.5 mm through the heart for calcium  scoring. Calcium  scoring was performed using the Agatston method. A 120 kV prospective, gated, contrast cardiac CT scan was obtained. Gantry rotation speed was 230 msec and collimation was 0.63 mm. Two sublingual nitroglycerin tablets (0.8 mg) were given. The 3D data set was reconstructed with motion correction for the best systolic or diastolic phase. Images were analyzed on a dedicated workstation using MPR, MIP, and VRT modes. The patient received 100mL OMNIPAQUE  IOHEXOL  350 MG/ML SOLN of contrast. FINDINGS: Image quality: Excellent. Noise artifact is: Limited. Coronary arteries: Normal coronary origins.  Right dominance. Right Coronary Artery: Dominant. No disease. Normal R-PLB and R-PDA branches. Left Main  Coronary Artery: Normal. Bifurcates into the LAD and LCx arteries. Left Anterior Descending Coronary Artery: Large anterior artery that wraps around the apex. Minimal mixed 1-24% proximal stenosis. Large D1 branch that bifurcates, minimal calcification without stenosis. Smaller D2 branch, no disease. Left Circumflex Artery: Lateral vessel, tortuous, no disease. OM branch, no disease. Aorta: Normal size, 36 mm at the mid ascending aorta (level of the PA bifurcation) measured double oblique. Aortic atherosclerosis. No dissection. Aortic Valve: Trileaflet. Minimal calcifications. Other findings: Normal pulmonary vein drainage into the left atrium. Normal left atrial appendage without a thrombus. Normal size of the pulmonary artery. Extra-cardiac findings: See attached radiology report for non-cardiac structures. IMPRESSION: 1. Minimal mixed non-obstructive CAD, CADRADS = 1. 2. Normal coronary origin with right dominance. 3. Coronary artery calcium  score is 539, which places the patient in the 77th percentile for age/race and sex-matched controls (MESA). 4. Aortic atherosclerosis and minimal aortic leaflet calcification. 5. Secondary risk factor modification is recommended. RECOMMENDATIONS: 1. CAD-RADS 0: No evidence of CAD (0%). Consider non-atherosclerotic causes of chest pain. 2. CAD-RADS 1: Minimal non-obstructive CAD (0-24%). Consider non-atherosclerotic causes of chest pain. Consider preventive therapy and risk factor modification. 3. CAD-RADS 2: Mild non-obstructive CAD (25-49%). Consider non-atherosclerotic causes of chest pain. Consider preventive therapy and risk factor modification. 4. CAD-RADS 3: Moderate stenosis. Consider symptom-guided anti-ischemic pharmacotherapy as well as risk factor modification per guideline directed care. Additional analysis with CT FFR will be submitted. 5. CAD-RADS 4: Severe stenosis. (70-99% or > 50% left main). Cardiac catheterization or CT FFR is recommended. Consider  symptom-guided anti-ischemic pharmacotherapy as well as risk factor modification per guideline directed care. Invasive coronary angiography recommended with revascularization per published guideline statements. 6. CAD-RADS 5: Total coronary occlusion (100%). Consider cardiac catheterization or viability assessment. Consider symptom-guided anti-ischemic pharmacotherapy as well as risk factor modification per guideline directed  care. 7. CAD-RADS N: Non-diagnostic study. Obstructive CAD can't be excluded. Alternative evaluation is recommended. Electronically Signed: By: Vinie JAYSON Maxcy M.D. On: 09/17/2024 08:43   LONG TERM MONITOR (3-14 DAYS) Result Date: 09/13/2024 Cardiac monitor (Zio Patch): 08/28/2024 - 09/02/2024 Dominant rhythm sinus. Heart rate 48-185 bpm.  Avg HR 68 bpm. No atrial fibrillation detected during the monitoring period. No ventricular tachycardia, high grade AV block, pauses (3 seconds or longer). Rare asymptomatic episodes of PSVT. Total supraventricular ectopic burden <1%. Total ventricular ectopic burden <1%. Patient triggered events: 14.  Underlying rhythm was sinus with a rare PACs/PVCs    Recent Labs: Lab Results  Component Value Date   WBC 4.0 08/12/2024   HGB 13.4 08/12/2024   PLT 198 08/12/2024   NA 131 (L) 08/13/2024   K 4.1 08/13/2024   CL 100 08/13/2024   CO2 20 (L) 08/13/2024   GLUCOSE 100 (H) 08/13/2024   BUN 18 08/13/2024   CREATININE 0.72 08/13/2024   BILITOT 0.6 08/12/2024   ALKPHOS 40 08/12/2024   AST 23 08/12/2024   ALT 18 08/12/2024   PROT 5.9 (L) 08/12/2024   ALBUMIN 4.1 08/12/2024   CALCIUM  8.8 (L) 08/13/2024   GFRAA 86 05/15/2021    Speciality Comments: Stelara  March 06, 2023  Procedures:  No procedures performed Allergies: Remicade  [infliximab ], Fosamax [alendronate sodium], Wellbutrin [bupropion], Clindamycin /lincomycin, Singulair [montelukast sodium], and Sulfa antibiotics   Assessment / Plan:     Visit Diagnoses: Primary osteoarthritis  of both hands-she has severe osteoarthritis in her hands with PIP and DIP thickening and subluxation of most of her DIP joints.  Joint protection muscle strengthening was discussed.  A handout on hand exercises was given.  S/P hip replacement, right - Dr. Edna.  She is good range of motion without discomfort.  Primary osteoarthritis of both knees-she had good range of motion of bilateral knee joints without any warmth swelling or effusion.  Patient states she was evaluated by Dr. Orpha and was told that she will need viscosupplement injections for osteoarthritis.  A handout on lower extremity exercise was given.  Primary osteoarthritis of both feet-she has off-and-on discomfort.  Proper fitting shoes were advised.  DDD (degenerative disc disease), cervical-she continues to have some stiffness and discomfort in her cervical spine.  Patient states she had injections in her cervical spine by Dr. Colon in the past which did not help much.  A handout on cervical spine exercise was given.  Spondylolisthesis at L4-L5 level - Followed by Dr. Colon.  She continues to have lower back pain.  Age-related osteoporosis without current pathological fracture - DEXA December 26, 2020 T score was -2.5 BMD 0.569 in the lumbar spine.  Patient states that she finished the course of Evenity  and is on Prolia  injections by Dr. Orpha.  Vitamin D  deficiency-she is on vitamin D  supplement.  Abnormal SPEP  Positive ANA (antinuclear antibody) - 10/22/19: ANA 1:80 NS, 01/06/20: ENA negative.  She has no clinical features of lupus and does not require any further workup.  Other microscopic colitis - Under care of [facility] gastro[facility]ology. recent hospitalization from 09/25/2023 through 09/28/2023.  Clostridioides difficile infection  History of gastroesophageal reflux (GERD)  History of hyperlipidemia  Essential hypertension  History of hypothyroidism  History of asthma  Gait instability  Orders: No orders  of the defined types were placed in this encounter.  No orders of the defined types were placed in this encounter.    Follow-Up Instructions: Return in about 1 year (around 10/06/2025) for Osteoarthritis,  Osteoporosis.   Maya Nash, MD  Note - This record has been created using Animal nutritionist.  Chart creation errors have been sought, but may not always  have been located. Such creation errors do not reflect on  the standard of medical care.

## 2024-10-02 DIAGNOSIS — I10 Essential (primary) hypertension: Secondary | ICD-10-CM | POA: Diagnosis not present

## 2024-10-02 DIAGNOSIS — N1831 Chronic kidney disease, stage 3a: Secondary | ICD-10-CM | POA: Diagnosis not present

## 2024-10-02 DIAGNOSIS — R399 Unspecified symptoms and signs involving the genitourinary system: Secondary | ICD-10-CM | POA: Diagnosis not present

## 2024-10-02 DIAGNOSIS — A09 Infectious gastroenteritis and colitis, unspecified: Secondary | ICD-10-CM | POA: Diagnosis not present

## 2024-10-03 DIAGNOSIS — K52839 Microscopic colitis, unspecified: Principal | ICD-10-CM

## 2024-10-03 MED ORDER — PANTOPRAZOLE 40 MG TABLET,DELAYED RELEASE
ORAL_TABLET | 0 refills | 0.00000 days
Start: 2024-10-03 — End: ?

## 2024-10-05 MED ORDER — PANTOPRAZOLE 40 MG TABLET,DELAYED RELEASE
ORAL_TABLET | ORAL | 2 refills | 0.00000 days | Status: CP
Start: 2024-10-05 — End: ?

## 2024-10-05 NOTE — Telephone Encounter (Signed)
 Patient is requesting the following refill  Requested Prescriptions     Pending Prescriptions Disp Refills    pantoprazole  (PROTONIX ) 40 MG tablet [Pharmacy Med Name: PANTOPRAZOLE  40MG  TABLETS] 180 tablet 0     Sig: TAKE 1 TABLET BY MOUTH TWICE DAILY BEFORE BREAKFAST AND BEFORE DINNER       Recent Visits  Date Type Provider Dept   06/30/24 Office Visit Long, Gerlene Redbird, MD [facility]   12/10/23 Office Visit Long, Gerlene Redbird, MD [facility] [place]   10/21/23 Office Visit Stem, Dorn Sharper, MD [facility]   10/07/23 Office Visit Stem, Dorn Sharper, MD [facility]   Showing recent visits within past 365 days and meeting all other requirements  Future Appointments  No visits were found meeting these conditions.  Showing future appointments within next 365 days and meeting all other requirements           Encounter for refill request:    Colonoscopy:     Lab Results   Component Value Date    WBC 5.3 06/30/2024    RBC 4.19 06/30/2024    HGB 13.4 06/30/2024    HCT 39.5 06/30/2024    PLT 219 06/30/2024    ALT 23 06/30/2024    AST 30 06/30/2024    ALKPHOS 45 (L) 06/30/2024    CRP <5.0 06/30/2024    CREATININE 0.70 06/30/2024       refills authorized x  3

## 2024-10-06 ENCOUNTER — Ambulatory Visit: Attending: Rheumatology | Admitting: Rheumatology

## 2024-10-06 ENCOUNTER — Encounter: Payer: Self-pay | Admitting: Rheumatology

## 2024-10-06 VITALS — BP 136/75 | HR 66 | Temp 97.6°F | Resp 14 | Ht 65.0 in | Wt 112.0 lb

## 2024-10-06 DIAGNOSIS — R778 Other specified abnormalities of plasma proteins: Secondary | ICD-10-CM | POA: Diagnosis not present

## 2024-10-06 DIAGNOSIS — K52838 Other microscopic colitis: Secondary | ICD-10-CM

## 2024-10-06 DIAGNOSIS — M19042 Primary osteoarthritis, left hand: Secondary | ICD-10-CM

## 2024-10-06 DIAGNOSIS — R2681 Unsteadiness on feet: Secondary | ICD-10-CM

## 2024-10-06 DIAGNOSIS — Z8709 Personal history of other diseases of the respiratory system: Secondary | ICD-10-CM

## 2024-10-06 DIAGNOSIS — A498 Other bacterial infections of unspecified site: Secondary | ICD-10-CM

## 2024-10-06 DIAGNOSIS — I10 Essential (primary) hypertension: Secondary | ICD-10-CM

## 2024-10-06 DIAGNOSIS — M5030 Other cervical disc degeneration, unspecified cervical region: Secondary | ICD-10-CM

## 2024-10-06 DIAGNOSIS — Z96641 Presence of right artificial hip joint: Secondary | ICD-10-CM

## 2024-10-06 DIAGNOSIS — M19041 Primary osteoarthritis, right hand: Secondary | ICD-10-CM

## 2024-10-06 DIAGNOSIS — M19071 Primary osteoarthritis, right ankle and foot: Secondary | ICD-10-CM

## 2024-10-06 DIAGNOSIS — M19072 Primary osteoarthritis, left ankle and foot: Secondary | ICD-10-CM

## 2024-10-06 DIAGNOSIS — R7689 Other specified abnormal immunological findings in serum: Secondary | ICD-10-CM | POA: Diagnosis not present

## 2024-10-06 DIAGNOSIS — M4316 Spondylolisthesis, lumbar region: Secondary | ICD-10-CM

## 2024-10-06 DIAGNOSIS — E559 Vitamin D deficiency, unspecified: Secondary | ICD-10-CM | POA: Diagnosis not present

## 2024-10-06 DIAGNOSIS — Z8639 Personal history of other endocrine, nutritional and metabolic disease: Secondary | ICD-10-CM

## 2024-10-06 DIAGNOSIS — Z8719 Personal history of other diseases of the digestive system: Secondary | ICD-10-CM

## 2024-10-06 DIAGNOSIS — M810 Age-related osteoporosis without current pathological fracture: Secondary | ICD-10-CM | POA: Diagnosis not present

## 2024-10-06 DIAGNOSIS — M170 Bilateral primary osteoarthritis of knee: Secondary | ICD-10-CM | POA: Diagnosis not present

## 2024-10-06 NOTE — Patient Instructions (Signed)
 Cervical Strain and Sprain Rehab Ask your health care provider which exercises are safe for you. Do exercises exactly as told by your health care provider and adjust them as directed. It is normal to feel mild stretching, pulling, tightness, or discomfort as you do these exercises. Stop right away if you feel sudden pain or your pain gets worse. Do not begin these exercises until told by your health care provider. Stretching and range-of-motion exercises Cervical side bending  Using good posture, sit on a stable chair or stand up. Without moving your shoulders, slowly tilt your left / right ear to your shoulder until you feel a stretch in the neck muscles on the opposite side. You should be looking straight ahead. Hold for __________ seconds. Repeat with the other side of your neck. Repeat __________ times. Complete this exercise __________ times a day. Cervical rotation  Using good posture, sit on a stable chair or stand up. Slowly turn your head to the side as if you are looking over your left / right shoulder. Keep your eyes level with the ground. Stop when you feel a stretch along the side and the back of your neck. Hold for __________ seconds. Repeat this by turning to your other side. Repeat __________ times. Complete this exercise __________ times a day. Thoracic extension and pectoral stretch  Roll a towel or a small blanket so it is about 4 inches (10 cm) in diameter. Lie down on your back on a firm surface. Put the towel in the middle of your back across your spine. It should not be under your shoulder blades. Put your hands behind your head and let your elbows fall out to your sides. Hold for __________ seconds. Repeat __________ times. Complete this exercise __________ times a day. Strengthening exercises Upper cervical flexion  Lie on your back with a thin pillow behind your head or a small, rolled-up towel under your neck. Gently tuck your chin toward your chest and nod  your head down to look toward your feet. Do not lift your head off the pillow. Hold for __________ seconds. Release the tension slowly. Relax your neck muscles completely before you repeat this exercise. Repeat __________ times. Complete this exercise __________ times a day. Cervical extension  Stand about 6 inches (15 cm) away from a wall, with your back facing the wall. Place a soft object, about 6-8 inches (15-20 cm) in diameter, between the back of your head and the wall. A soft object could be a small pillow, a ball, or a folded towel. Gently tilt your head back and press into the soft object. Keep your jaw and forehead relaxed. Hold for __________ seconds. Release the tension slowly. Relax your neck muscles completely before you repeat this exercise. Repeat __________ times. Complete this exercise __________ times a day. Posture and body mechanics Body mechanics refer to the movements and positions of your body while you do your daily activities. Posture is part of body mechanics. Good posture and healthy body mechanics can help to relieve stress in your body's tissues and joints. Good posture means that your spine is in its natural S-curve position (your spine is neutral), your shoulders are pulled back slightly, and your head is not tipped forward. The following are general guidelines for using improved posture and body mechanics in your everyday activities. Sitting  When sitting, keep your spine neutral and keep your feet flat on the floor. Use a footrest, if needed, and keep your thighs parallel to the floor. Avoid rounding  your shoulders. Avoid tilting your head forward. When working at a desk or a computer, keep your desk at a height where your hands are slightly lower than your elbows. Slide your chair under your desk so you are close enough to maintain good posture. When working at a computer, place your monitor at a height where you are looking straight ahead and you do not have to  tilt your head forward or downward to look at the screen. Standing  When standing, keep your spine neutral and keep your feet about hip-width apart. Keep a slight bend in your knees. Your ears, shoulders, and hips should line up. When you do a task in which you stand in one place for a long time, place one foot up on a stable object that is 2-4 inches (5-10 cm) high, such as a footstool. This helps keep your spine neutral. Resting When lying down and resting, avoid positions that are most painful for you. Try to support your neck in a neutral position. You can use a contour pillow or a small rolled-up towel. Your pillow should support your neck but not push on it. This information is not intended to replace advice given to you by your health care provider. Make sure you discuss any questions you have with your health care provider. Document Revised: 03/11/2023 Document Reviewed: 05/28/2022 Elsevier Patient Education  2024 [employer]Exercises for Chronic Knee Pain Chronic knee pain is pain that lasts longer than 3 months. For most people with chronic knee pain, exercise and weight loss is an important part of treatment. Your health care provider may want you to focus on: Making the muscles that support your knee stronger. This can take pressure off your knee and reduce pain. Preventing knee stiffness. How far you can move your knee, keeping it there or making it farther. Losing weight (if this applies) to take pressure off your knee, lower your risk for injury, and make it easier for you to exercise. Your provider will help you make an exercise program that fits your needs and physical abilities. Below are simple, low-impact exercises you can do at home. Ask your provider or physical therapist how often you should do your exercise program and how many times to repeat each exercise. General safety tips  Get your provider's approval before doing any exercises. Start slowly and stop any time you  feel pain. Do not exercise if your knee pain is flaring up. Warm up first. Stretching a cold muscle can cause an injury. Do 5-10 minutes of easy movement or light stretching before beginning your exercises. Do 5-10 minutes of low-impact activity (like walking or cycling) before starting strengthening exercises. Contact your provider any time you have pain during or after exercising. Exercise can cause discomfort but should not be painful. It is normal to be a little stiff or sore after exercising. Stretching and range-of-motion exercises Front thigh stretch  Stand up straight and support your body by holding on to a chair or resting one hand on a wall. With your legs straight and close together, bend one knee to lift your heel up toward your butt. Using one hand for support, grab your ankle with your free hand. Pull your foot up closer toward your butt to feel the stretch in front of your thigh. Hold the stretch for 30 seconds. Repeat __________ times. Complete this exercise __________ times a day. Back thigh stretch  Sit on the floor with your back straight and your legs out straight in front  of you. Place the palms of your hands on the floor and slide them toward your feet as you bend at the hip. Try to touch your nose to your knees and feel the stretch in the back of your thighs. Hold for 30 seconds. Repeat __________ times. Complete this exercise __________ times a day. Calf stretch  Stand facing a wall. Place the palms of your hands flat against the wall, arms extended, and lean slightly against the wall. Get into a lunge position with one leg bent at the knee and the other leg stretched out straight behind you. Keep both feet facing the wall and increase the bend in your knee while keeping the heel of the other leg flat on the ground. You should feel the stretch in your calf. Hold for 30 seconds. Repeat __________ times. Complete this exercise __________ times a day. Strengthening  exercises Straight leg lift  Lie on your back with one knee bent and the other leg out straight. Slowly lift the straight leg without bending the knee. Lift until your foot is about 12 inches (30 cm) off the floor. Hold for 3-5 seconds and slowly lower your leg. Repeat __________ times. Complete this exercise __________ times a day. Single leg dip  Stand between two chairs and put both hands on the backs of the chairs for support. Extend one leg out straight with your body weight resting on the heel of the standing leg. Slowly bend your standing knee to dip your body to the level that is comfortable for you. Hold for 3-5 seconds. Repeat __________ times. Complete this exercise __________ times a day. Hamstring curls  Stand straight, knees close together, facing the back of a chair. Hold on to the back of a chair with both hands. Keep one leg straight. Bend the other knee while bringing the heel up toward the butt until the knee is bent at a 90-degree angle (right angle). Hold for 3-5 seconds. Repeat __________ times. Complete this exercise __________ times a day. Wall squat  Stand straight with your back, hips, and head against a wall. Step forward one foot at a time with your back still against the wall. Your feet should be 2 feet (61 cm) from the wall at shoulder width. Keeping your back, hips, and head against the wall, slide down the wall to as close to a sitting position as you can get. Hold for 5-10 seconds, then slowly slide back up. Repeat __________ times. Complete this exercise __________ times a day. Step-ups  Stand in front of a sturdy platform or stool that is about 6 inches (15 cm) high. Slowly step up with your left / right foot, keeping your knee in line with your hip and foot. Do not let your knee bend so far that you cannot see your toes. Hold on to a chair for balance, but do not use it for support. Slowly unlock your knee and lower yourself to the starting  position. Repeat __________ times. Complete this exercise __________ times a day. Contact a health care provider if: Your exercises cause pain. Your pain is worse after you exercise. Your pain prevents you from doing your exercises. This information is not intended to replace advice given to you by your health care provider. Make sure you discuss any questions you have with your health care provider. Document Revised: 11/20/2022 Document Reviewed: 11/20/2022 Elsevier Patient Education  2024 [employer]Hand Exercises Hand exercises can be helpful for almost anyone. They can strengthen your hands and improve  flexibility and movement. The exercises can also increase blood flow to the hands. These results can make your work and daily tasks easier for you. Hand exercises can be especially helpful for people who have joint pain from arthritis or nerve damage from using their hands over and over. These exercises can also help people who injure a hand. Exercises Most of these hand exercises are gentle stretching and motion exercises. It is usually safe to do them often throughout the day. Warming up your hands before exercise may help reduce stiffness. You can do this with gentle massage or by placing your hands in warm water  for 10-15 minutes. It is normal to feel some stretching, pulling, tightness, or mild discomfort when you begin new exercises. In time, this will improve. Remember to always be careful and stop right away if you feel sudden, very bad pain or your pain gets worse. You want to get better and be safe. Ask your health care provider which exercises are safe for you. Do exercises exactly as told by your provider and adjust them as told. Do not begin these exercises until told by your provider. Knuckle bend or claw fist  Stand or sit with your arm, hand, and all five fingers pointed straight up. Make sure to keep your wrist straight. Gently bend your fingers down toward your palm until  the tips of your fingers are touching your palm. Keep your big knuckle straight and only bend the small knuckles in your fingers. Hold this position for 10 seconds. Straighten your fingers back to your starting position. Repeat this exercise 5-10 times with each hand. Full finger fist  Stand or sit with your arm, hand, and all five fingers pointed straight up. Make sure to keep your wrist straight. Gently bend your fingers into your palm until the tips of your fingers are touching the middle of your palm. Hold this position for 10 seconds. Extend your fingers back to your starting position, stretching every joint fully. Repeat this exercise 5-10 times with each hand. Straight fist  Stand or sit with your arm, hand, and all five fingers pointed straight up. Make sure to keep your wrist straight. Gently bend your fingers at the big knuckle, where your fingers meet your hand, and at the middle knuckle. Keep the knuckle at the tips of your fingers straight and try to touch the bottom of your palm. Hold this position for 10 seconds. Extend your fingers back to your starting position, stretching every joint fully. Repeat this exercise 5-10 times with each hand. Tabletop  Stand or sit with your arm, hand, and all five fingers pointed straight up. Make sure to keep your wrist straight. Gently bend your fingers at the big knuckle, where your fingers meet your hand, as far down as you can. Keep the small knuckles in your fingers straight. Think of forming a tabletop with your fingers. Hold this position for 10 seconds. Extend your fingers back to your starting position, stretching every joint fully. Repeat this exercise 5-10 times with each hand. Finger spread  Place your hand flat on a table with your palm facing down. Make sure your wrist stays straight. Spread your fingers and thumb apart from each other as far as you can until you feel a gentle stretch. Hold this position for 10 seconds. Bring  your fingers and thumb tight together again. Hold this position for 10 seconds. Repeat this exercise 5-10 times with each hand. Making circles  Stand or sit with your arm,  hand, and all five fingers pointed straight up. Make sure to keep your wrist straight. Make a circle by touching the tip of your thumb to the tip of your index finger. Hold for 10 seconds. Then open your hand wide. Repeat this motion with your thumb and each of your fingers. Repeat this exercise 5-10 times with each hand. Thumb motion  Sit with your forearm resting on a table and your wrist straight. Your thumb should be facing up toward the ceiling. Keep your fingers relaxed as you move your thumb. Lift your thumb up as high as you can toward the ceiling. Hold for 10 seconds. Bend your thumb across your palm as far as you can, reaching the tip of your thumb for the small finger (pinkie) side of your palm. Hold for 10 seconds. Repeat this exercise 5-10 times with each hand. Grip strengthening  Hold a stress ball or other soft ball in the middle of your hand. Slowly increase the pressure, squeezing the ball as much as you can without causing pain. Think of bringing the tips of your fingers into the middle of your palm. All of your finger joints should bend when doing this exercise. Hold your squeeze for 10 seconds, then relax. Repeat this exercise 5-10 times with each hand. Contact a health care provider if: Your hand pain or discomfort gets much worse when you do an exercise. Your hand pain or discomfort does not improve within 2 hours after you exercise. If you have either of these problems, stop doing these exercises right away. Do not do them again unless your provider says that you can. Get help right away if: You develop sudden, severe hand pain or swelling. If this happens, stop doing these exercises right away. Do not do them again unless your provider says that you can. This information is not intended to replace  advice given to you by your health care provider. Make sure you discuss any questions you have with your health care provider. Document Revised: 11/20/2022 Document Reviewed: 11/20/2022 Elsevier Patient Education  2024 [employer].

## 2024-10-09 NOTE — Progress Notes (Signed)
 [facility] Refill Coordination Note    Specialty Medication(s) to be Shipped:   Inflammatory Disorders: Velsipity     Other medication(s) to be shipped: No additional medications requested for fill at this time    Specialty Medications not needed at this time: N/A     Traci Bennett, DOB: March 08, 1941  Phone: [phone] (home)       All above HIPAA information was verified with patient.     Was a nurse, learning disability used for this call? No    Completed refill call assessment today to schedule patient's medication shipment from the [facility]  [phone]).  All relevant notes have been reviewed.     Specialty medication(s) and dose(s) confirmed: Regimen is correct and unchanged.   Changes to medications: Marquel reports no changes at this time.  Changes to insurance: No  New side effects reported not previously addressed with a pharmacist or physician: None reported  Questions for the pharmacist: No    Confirmed patient received a Conservation Officer, Historic Buildings and a Surveyor, Mining with first shipment. The patient will receive a drug information handout for each medication shipped and additional FDA Medication Guides as required.       DISEASE/MEDICATION-SPECIFIC INFORMATION        N/A    SPECIALTY MEDICATION ADHERENCE     Medication Adherence    Patient reported X missed doses in the last month: 0  Specialty Medication: VELSIPITY  2 mg Tab  Patient is on additional specialty medications: No              Were doses missed due to medication being on hold? No    Velsipity  2 mg: 14 days of medicine on hand        REFERRAL TO PHARMACIST     Referral to the pharmacist: Not needed      [facility]     Shipping address confirmed in Epic.     Cost and Payment: Patient has a $0 copay, payment information is not required.    Delivery Scheduled: Yes, Expected medication delivery date: 10/20/24.     Medication will be delivered via UPS to the prescription address in Epic WAM.    [name]   [facility]  Specialty Technician

## 2024-10-12 DIAGNOSIS — E039 Hypothyroidism, unspecified: Secondary | ICD-10-CM | POA: Diagnosis not present

## 2024-10-12 DIAGNOSIS — R42 Dizziness and giddiness: Secondary | ICD-10-CM | POA: Diagnosis not present

## 2024-10-13 DIAGNOSIS — M170 Bilateral primary osteoarthritis of knee: Secondary | ICD-10-CM | POA: Diagnosis not present

## 2024-10-18 DIAGNOSIS — I10 Essential (primary) hypertension: Secondary | ICD-10-CM | POA: Diagnosis not present

## 2024-10-18 DIAGNOSIS — N1831 Chronic kidney disease, stage 3a: Secondary | ICD-10-CM | POA: Diagnosis not present

## 2024-10-18 DIAGNOSIS — E039 Hypothyroidism, unspecified: Secondary | ICD-10-CM | POA: Diagnosis not present

## 2024-10-18 DIAGNOSIS — E782 Mixed hyperlipidemia: Secondary | ICD-10-CM | POA: Diagnosis not present

## 2024-10-18 MED FILL — VELSIPITY 2 MG TABLET: ORAL | 60 days supply | Qty: 30 | Fill #1

## 2024-10-21 DIAGNOSIS — M170 Bilateral primary osteoarthritis of knee: Secondary | ICD-10-CM | POA: Diagnosis not present

## 2024-10-28 DIAGNOSIS — L0291 Cutaneous abscess, unspecified: Secondary | ICD-10-CM | POA: Diagnosis not present
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
## 2024-10-30 ENCOUNTER — Encounter: Payer: Self-pay | Admitting: Physical Therapy

## 2024-10-30 ENCOUNTER — Ambulatory Visit: Attending: Family Medicine | Admitting: Physical Therapy

## 2024-10-30 ENCOUNTER — Other Ambulatory Visit: Payer: Self-pay

## 2024-10-30 DIAGNOSIS — R42 Dizziness and giddiness: Secondary | ICD-10-CM | POA: Diagnosis present

## 2024-10-30 DIAGNOSIS — R2681 Unsteadiness on feet: Secondary | ICD-10-CM

## 2024-10-30 DIAGNOSIS — H8113 Benign paroxysmal vertigo, bilateral: Secondary | ICD-10-CM | POA: Diagnosis present

## 2024-11-04 ENCOUNTER — Ambulatory Visit: Admitting: Physical Therapy

## 2024-11-09 ENCOUNTER — Ambulatory Visit: Admitting: Physical Therapy

## 2024-11-17 ENCOUNTER — Ambulatory Visit

## 2024-11-23 ENCOUNTER — Encounter: Payer: Self-pay | Admitting: Physical Therapy

## 2024-11-23 ENCOUNTER — Ambulatory Visit: Attending: Family Medicine | Admitting: Physical Therapy

## 2024-11-23 DIAGNOSIS — H8113 Benign paroxysmal vertigo, bilateral: Secondary | ICD-10-CM | POA: Diagnosis present

## 2024-11-23 DIAGNOSIS — R42 Dizziness and giddiness: Secondary | ICD-10-CM | POA: Insufficient documentation

## 2024-11-23 DIAGNOSIS — R2681 Unsteadiness on feet: Secondary | ICD-10-CM | POA: Insufficient documentation

## 2024-11-23 NOTE — Therapy (Signed)
 " OUTPATIENT PHYSICAL THERAPY VESTIBULAR TREATMENT     Patient Name: Debbie Houston MRN: [id number] DOB:09-22-1941, 84 y.o., female Today's Date: 11/23/2024  END OF SESSION:  PT End of Session - 11/23/24 1320     Visit Number 2    Number of Visits 13    Date for Recertification  12/11/24    Authorization Type [employer]    PT Start Time 1320   pt arrives late   PT Stop Time 1400    PT Time Calculation (min) 40 min    Activity Tolerance Patient tolerated treatment well    Behavior During Therapy WFL for tasks assessed/performed           Past Medical History:  Diagnosis Date   Arthritis    Benign labile hypertension    Cancer (HCC)    skin cancer on face   Cellulitis    Colitis    DJD (degenerative joint disease)    Endometrial polyp    GERD (gastroesophageal reflux disease)    History of basal cell carcinoma excision    History of esophagitis    HOH (hard of hearing)    right ear better than left  -- wears no aides   Hyperlipidemia    Hypothyroidism    Neuromuscular disorder (HCC)    neuropathy in feet   Osteopenia    Prediabetes    S/P right hip fracture 08/17/2020   Sepsis (HCC) 2023   per patient   Spinal stenosis 2025   per patient   Vitamin D  deficiency    Wears glasses    Past Surgical History:  Procedure Laterality Date   BACK SURGERY  2020   BIOPSY  09/27/2023   Procedure: BIOPSY;  Surgeon: Rosalie Kitchens, MD;  Location: [facility];  Service: Gastroenterology;;   CATARACT EXTRACTION, BILATERAL Bilateral 2020   CONVERSION TO TOTAL HIP Right 01/22/2022   Procedure: CONVERSION TO TOTAL HIP;  Surgeon: Edna Toribio LABOR, MD;  Location: [facility] ORS;  Service: Orthopedics;  Laterality: Right;   DILATATION & CURETTAGE/HYSTEROSCOPY WITH MYOSURE N/A 10/27/2014   Procedure: DILATATION & CURETTAGE/HYSTEROSCOPY WITH MYOSURE;  Surgeon: Norleen GORMAN Skill, MD;  Location: [facility];  Service: Gynecology;  Laterality: N/A;   DILATION AND  CURETTAGE OF UTERUS     FLEXIBLE SIGMOIDOSCOPY N/A 09/27/2023   Procedure: FLEXIBLE SIGMOIDOSCOPY;  Surgeon: Rosalie Kitchens, MD;  Location: [facility] ENDOSCOPY;  Service: Gastroenterology;  Laterality: N/A;   HIP SURGERY Right 04/2020   rod and screw in right hip    MOHS SURGERY  2005   LEFT NASAL BRIDGE FOR BASAL CELL   TONSILLECTOMY  age 78   Patient Active Problem List   Diagnosis Date Noted   Elevated troponin 08/13/2024   Sinus tachycardia 08/13/2024   Chest pain 08/11/2024   Hyponatremia 09/26/2023   Cellulitis of left buttock 09/26/2023   Proctitis 09/25/2023   History of microscopic colitis 09/25/2023   GERD (gastroesophageal reflux disease) 09/25/2023   Sepsis secondary to UTI (HCC) 06/24/2022   Extensor tenosynovitis of right wrist 06/23/2022   Thrombocytopenia 06/23/2022   Prediabetes 06/23/2022   Intertrochanteric fracture of right femur, closed, with nonunion, subsequent encounter 01/22/2022   History of ulcerative colitis 07/10/2021   Complete right bundle branch block (RBBB) 05/15/2021   Body mass index (BMI) of 19.0-19.9 in adult 05/12/2021   Bilateral nephrolithiasis 09/21/2020   Senile purpura 08/17/2020   Bilateral temporomandibular joint pain 06/15/2020   Dizziness 03/25/2019   Spondylolisthesis at L4-L5 level 11/25/2018  Primary hypertension    Hyperlipidemia    Hypothyroidism    GERD    Asthma    DJD (degenerative joint disease)    Vitamin D  deficiency    Osteoporosis with current pathological fracture    Allergy     PCP: Espinoza, Alejandra, DO  REFERRING PROVIDER: Espinoza, Alejandra, DO   REFERRING DIAG: R42 (ICD-10-CM) - Dizziness and giddiness   THERAPY DIAG:  Dizziness and giddiness  BPPV (benign paroxysmal positional vertigo), bilateral  Unsteadiness on feet  ONSET DATE: several months ago   Rationale for Evaluation and Treatment: Rehabilitation  SUBJECTIVE:   SUBJECTIVE STATEMENT: I still have the dizziness, but I wonder if it is more  with my next.  Still dizzy-when I stand up, I'm off balance.  Not especially getting dizzy (dramatic like before) when going down to the bed.  Having issues with my neck-more pain-have seen Dr. Colon and got shots that did not help.  Pt accompanied by: self  PERTINENT HISTORY: Benign labile HTN, HOH, HLD, neuropathy, spinal stenosis, back surgery, R hip surgery   PAIN:  Are you having pain? Yes: NPRS scale: 5/10 Pain location: neck Pain description: tightness, pain Aggravating factors: turning and moving head Relieving factors: heating pad  PRECAUTIONS: Fall  RED FLAGS: None   WEIGHT BEARING RESTRICTIONS: No  FALLS: Has patient fallen in last 6 months? No  LIVING ENVIRONMENT: Lives with: lives with their spouse; caregiver for her husband who has dementia  Lives in: House/apartment Stairs: 4 steps to enter with handrail Has following equipment at home: Single point cane, Environmental Consultant - 2 wheeled, and Grab bars  PLOF: Independent, Vocation/Vocational requirements: retired, and Leisure: pt is not following an exercise regimen  PATIENT GOALS: improve dizziness   OBJECTIVE:   TODAY'S TREATMENT: 11/23/2024 Activity Comments  R DH No nystagmus, pt reports mild dizziness  L DH No nystagmus, pt reports mild dizziness  R roll test No nystagmus  L roll test No nystagmus  10 M walk: 13.15 sec (2.49 ft/sec)   DGI:  13/24 See below   Neck ROM: Cervical flexion  50 Cervical extension  40 degrees with pain R Rotation   60 tightness L rotation:   50 tightness    M-CTSIB  Condition 1: Firm Surface, EO 30 Sec, Normal Sway  Condition 2: Firm Surface, EC 30 Sec, Mild Sway  Condition 3: Foam Surface, EO 30 Sec, Mild Sway  Condition 4: Foam Surface, EC 30 Sec, Moderate and Severe Sway     [facility] PT Assessment - 11/23/24 1334       Standardized Balance Assessment   Standardized Balance Assessment Dynamic Gait Index      Dynamic Gait Index   Level Surface Mild Impairment   8.06    Change in Gait Speed Mild Impairment    Gait with Horizontal Head Turns Moderate Impairment   10.90 reports imbalance   Gait with Vertical Head Turns Moderate Impairment   10.56 sec with imbalance   Gait and Pivot Turn Mild Impairment    Step Over Obstacle Mild Impairment    Step Around Obstacles Mild Impairment    Steps Moderate Impairment    Total Score 13    DGI comment: Scores <19/24 indicate increased fall risk         Access Code: [email or web address] URL: [email or web address] Date: 11/23/2024 Prepared by: [facility] - Outpatient  Rehab - [facility]  Exercises - Corner Balance Feet Together: Eyes Closed With Head Turns  - 2 x daily - 7  x weekly - 2 sets - 5 reps  PATIENT EDUCATION: Education details:  Results of testing today, HEP initiated; rationale for treating dizziness (as unsteadiness based on pt's symptom report during session today and no nystagmus with positional testing) Person educated: Patient Education method: Explanation, Demonstration, and Handouts Education comprehension: verbalized understanding, returned demonstration, and needs further education  ------------------------------------------ Note: Objective measures were completed at Evaluation unless otherwise noted.  DIAGNOSTIC FINDINGS: 08/24/24 head CT: No evidence of acute intracranial abnormality. 2. Mild chronic small vessel ischemic disease.   COGNITION: Overall cognitive status: Within functional limits for tasks assessed   SENSATION: Pt reports neuropathy in B hands/feet  POSTURE:  rounded shoulders, forward head, and increased thoracic kyphosis  GAIT: Gait pattern: flexed knees and hips in gait, short B step length, unsteady Assistive device utilized: None Level of assistance: SBA  PATIENT SURVEYS:  DHI: 44/100  VESTIBULAR ASSESSMENT   GENERAL OBSERVATION: pt wears bifocals    OCULOMOTOR EXAM: Ocular Alignment: Cover/Uncover Test: WNL Cover/Cross Cover Test: WNL; L eye  rests slightly superiorly (Lhypertropia)   Ocular ROM: No Limitations   Spontaneous Nystagmus: absent;    Gaze-Induced Nystagmus: absent   Smooth Pursuits: intact; c/o dizzy   Saccades: slow   Convergence/Divergence: 8 cm d/t L eye insufficiency    VESTIBULAR - OCULAR REFLEX:    Slow VOR: Normal, slow; c/o dizziness horizontal    VOR Cancellation: Comment: difficulty maintaining tracking to L; c/o dizziness    Head-Impulse Test: HIT Right: negative HIT Left: positive     AUDITORY SCREEN:  Rinne Test: NT; pt wears hearing aids     POSITIONAL TESTING:  Right Roll Test: negative; c/o latent dizziness Left Roll Test: small amplitude L upbeating torsional nystagmus and dizziness lasting ~1 min  C/o dizziness upon sitting up   Right Loaded Dix-Hallpike: c/o dizziness; very small amplitude nystagmus lasting ~15 sec Left Loaded Dix-Hallpike: negative  *pt c/o dizziness upon sitting up from B [facility]                                                                                                                                 TREATMENT DATE: 10/30/24  PATIENT EDUCATION: Education details: encouraged pt to have family member/friend to drive her to sessions in case of symptom exacerbation during sessions- she reports that she does not have anyone to drive her, handout on BPPV and visit expectations, prognosis, POC, edu on exam findings and relevance to functional impairments  Person educated: Patient Education method: Explanation, Demonstration, Tactile cues, Verbal cues, and Handouts Education comprehension: verbalized understanding  HOME EXERCISE PROGRAM: Not initiated   GOALS: Goals reviewed with patient? Yes  SHORT TERM GOALS: Target date: 11/13/2024  Patient to be independent with initial HEP. Baseline: HEP initiated Goal status: IN PROGRESS    LONG TERM GOALS: Target date: 12/11/2024  Patient to be independent with advanced HEP. Baseline: Not yet initiated  Goal status:  IN PROGRESS  Patient to report 0/10 dizziness with standing vertical and horizontal VOR for 30 seconds. Baseline: Unable Goal status: IN PROGRESS  Patient will report 0/10 dizziness with bed mobility.  Baseline: Symptomatic  Goal status: IN PROGRESS  Patient to score at least 20/24 on DGI in order to decrease risk of falls. Baseline: 13/24 11/23/24 Goal status: IN PROGRESS  to improve to at least 2.62 ft/sec for improved community ambulation. Baseline: 2.49 ft/sec 11/23/2024 Goal status: INITIAL  Patient will ambulate with LRAD while performing head turns to scan environment with good stability in order to indicate safe community mobility. Baseline: NT Goal status: IN PROGRESS  Patient to score at least 18 points less on DHI in order to meet MCID and improve functional outcomes.  Baseline: 44 Goal status: IN PROGRESS   ASSESSMENT:  CLINICAL IMPRESSION: Pt presents today for first visit since PT evaluation 10/30/2024.  She verbalizes that the dizziness feels more like imbalance and wonders also if it comes from her neck.  Reassessed positional vertigo, with no overt nystagmus noted, pt has reports of dizziness going into and coming out of positions.  Assessed 10 M walk and DGI, with 13/24 indicative of increased fall risk. Also, looked at MCTSIB, with pt having increased sway on Condition 2 and 4 with vision removed.  Initiated HEP to address decreased vestibular system use for balance, including instructions for safe performance at home.   No complaints upon leaving PT today.  Pt will continue to benefit from skilled PT towards goals for improved functional mobility, decreased dizziness, and decreased fall risk.   OBJECTIVE IMPAIRMENTS: Abnormal gait, decreased activity tolerance, decreased balance, decreased knowledge of use of DME, dizziness, and postural dysfunction.   ACTIVITY LIMITATIONS: carrying, lifting, bending, sitting, standing, squatting, sleeping, stairs, transfers,  bed mobility, bathing, toileting, dressing, reach over head, hygiene/grooming, locomotion level, and caring for others  PARTICIPATION LIMITATIONS: meal prep, cleaning, laundry, driving, shopping, community activity, and church  PERSONAL FACTORS: Age, Fitness, Past/current experiences, Time since onset of injury/illness/exacerbation, and 3+ comorbidities: Benign labile HTN, HOH, HLD, neuropathy, spinal stenosis, back surgery, R hip surgery  are also affecting patient's functional outcome.   REHAB POTENTIAL: Good  CLINICAL DECISION MAKING: Evolving/moderate complexity  EVALUATION COMPLEXITY: Moderate   PLAN:  PT FREQUENCY: 1-2x/week  PT DURATION: 6 weeks  PLANNED INTERVENTIONS: 97164- PT Re-evaluation, 97110-Therapeutic exercises, 97530- Therapeutic activity, 97112- Neuromuscular re-education, 97535- Self Care, 02859- Manual therapy, [phone]- Gait training, [phone]- Canalith repositioning, [phone] (1-2 muscles), [phone] (3+ muscles)- Dry Needling, Patient/Family education, Balance training, Stair training, Taping, Joint mobilization, Spinal mobilization, Vestibular training, DME instructions, Cryotherapy, and Moist heat  PLAN FOR NEXT SESSION: Review initial HEP; recheck positional testing as needed and treat; initiate habituation and VOR HEP   Greig Anon, PT 11/23/2024 2:03 PM Phone: [phone] Fax: [phone]  [facility] at [facility] [address] [place], [place] [address] Phone # [phone] Fax # [phone]   "

## 2024-11-25 ENCOUNTER — Encounter: Payer: Self-pay | Admitting: Podiatry

## 2024-11-25 ENCOUNTER — Ambulatory Visit (INDEPENDENT_AMBULATORY_CARE_PROVIDER_SITE_OTHER): Admitting: Podiatry

## 2024-11-25 DIAGNOSIS — B351 Tinea unguium: Secondary | ICD-10-CM

## 2024-11-25 MED ORDER — CICLOPIROX 8 % EX SOLN: Freq: Every day | CUTANEOUS | 0 refills | Status: AC

## 2024-11-25 NOTE — Progress Notes (Signed)
"  °  Subjective:  Patient ID: Debbie Houston, female    DOB: May 12, 1941,   MRN: [id number]  Chief Complaint  Patient presents with   Nail Problem    It's okay, that middle toe is still kind of quirky.    84 y.o. female presents for follow-up of fungal nails.  She has been taking Lamisil .  Relates she had some stomach upset with this and only able to take for about two weeks.  Denies any other pedal complaints. Denies n/v/f/c.   Past Medical History:  Diagnosis Date   Arthritis    Benign labile hypertension    Cancer (HCC)    skin cancer on face   Cellulitis    Colitis    DJD (degenerative joint disease)    Endometrial polyp    GERD (gastroesophageal reflux disease)    History of basal cell carcinoma excision    History of esophagitis    HOH (hard of hearing)    right ear better than left  -- wears no aides   Hyperlipidemia    Hypothyroidism    Neuromuscular disorder (HCC)    neuropathy in feet   Osteopenia    Prediabetes    S/P right hip fracture 08/17/2020   Sepsis (HCC) 2023   per patient   Spinal stenosis 2025   per patient   Vitamin D  deficiency    Wears glasses     Objective:  Physical Exam: Vascular: DP/PT pulses 2/4 bilateral. CFT <3 seconds. Normal hair growth on digits. No edema.  Skin. No lacerations or abrasions bilateral feet. Bilateral second digit nails are thickened and dystrophic with subungual debris.  Musculoskeletal: MMT 5/5 bilateral lower extremities in DF, PF, Inversion and Eversion. Deceased ROM in DF of ankle joint.  Neurological: Sensation intact to light touch.   Assessment:   1. Onychomycosis        Plan:  Patient was evaluated and treated and all questions answered. -Examined patient -Discussed treatment options for painful dystrophic nails  -Culture positive for candida  -Discussed fungal nail treatment options including oral, topical, and laser treatments.  -Unable to tolerate lamisil  will send in Penlac .  -Patient to return  in 3 months for recheck   Asberry Failing, DPM    "

## 2024-11-27 ENCOUNTER — Telehealth: Payer: Self-pay

## 2024-11-27 NOTE — Telephone Encounter (Signed)
 A request received fro [employer]  8% solution. PA submitted through covermymeds and waiting on response.  [employer]  (Key: [id number]) PA Case ID #: [id number] Rx #: [id number]

## 2024-11-30 NOTE — Telephone Encounter (Signed)
 PA was approved. 11/27/24-10/29/25

## 2024-12-01 ENCOUNTER — Ambulatory Visit

## 2024-12-02 DIAGNOSIS — K5100 Ulcerative (chronic) pancolitis without complications: Principal | ICD-10-CM

## 2024-12-03 ENCOUNTER — Other Ambulatory Visit: Payer: Self-pay | Admitting: Family Medicine

## 2024-12-03 DIAGNOSIS — R09A2 Foreign body sensation, throat: Secondary | ICD-10-CM

## 2024-12-03 DIAGNOSIS — R42 Dizziness and giddiness: Secondary | ICD-10-CM

## 2024-12-03 DIAGNOSIS — R6889 Other general symptoms and signs: Secondary | ICD-10-CM

## 2024-12-03 DIAGNOSIS — R519 Headache, unspecified: Secondary | ICD-10-CM

## 2024-12-04 ENCOUNTER — Ambulatory Visit: Admitting: Physical Therapy

## 2024-12-04 ENCOUNTER — Encounter: Payer: Self-pay | Admitting: Physical Therapy

## 2024-12-04 DIAGNOSIS — R2681 Unsteadiness on feet: Secondary | ICD-10-CM

## 2024-12-04 DIAGNOSIS — R42 Dizziness and giddiness: Secondary | ICD-10-CM

## 2024-12-04 NOTE — Therapy (Signed)
 " OUTPATIENT PHYSICAL THERAPY VESTIBULAR TREATMENT     Patient Name: Debbie Houston MRN: [id number] DOB:September 15, 1941, 84 y.o., female Today's Date: 12/04/2024  END OF SESSION:  PT End of Session - 12/04/24 1100     Visit Number 3    Number of Visits 13    Date for Recertification  12/11/24    Authorization Type [employer] Supplemental    PT Start Time 1103    PT Stop Time 1141    PT Time Calculation (min) 38 min    Activity Tolerance Patient tolerated treatment well    Behavior During Therapy WFL for tasks assessed/performed            Past Medical History:  Diagnosis Date   Arthritis    Benign labile hypertension    Cancer (HCC)    skin cancer on face   Cellulitis    Colitis    DJD (degenerative joint disease)    Endometrial polyp    GERD (gastroesophageal reflux disease)    History of basal cell carcinoma excision    History of esophagitis    HOH (hard of hearing)    right ear better than left  -- wears no aides   Hyperlipidemia    Hypothyroidism    Neuromuscular disorder (HCC)    neuropathy in feet   Osteopenia    Prediabetes    S/P right hip fracture 08/17/2020   Sepsis (HCC) 2023   per patient   Spinal stenosis 2025   per patient   Vitamin D  deficiency    Wears glasses    Past Surgical History:  Procedure Laterality Date   BACK SURGERY  2020   BIOPSY  09/27/2023   Procedure: BIOPSY;  Surgeon: Rosalie Kitchens, MD;  Location: [facility] ENDOSCOPY;  Service: Gastroenterology;;   CATARACT EXTRACTION, BILATERAL Bilateral 2020   CONVERSION TO TOTAL HIP Right 01/22/2022   Procedure: CONVERSION TO TOTAL HIP;  Surgeon: Edna Toribio LABOR, MD;  Location: [facility] ORS;  Service: Orthopedics;  Laterality: Right;   DILATATION & CURETTAGE/HYSTEROSCOPY WITH MYOSURE N/A 10/27/2014   Procedure: DILATATION & CURETTAGE/HYSTEROSCOPY WITH MYOSURE;  Surgeon: Norleen GORMAN Skill, MD;  Location: [facility] [place];  Service: Gynecology;  Laterality: N/A;   DILATION AND CURETTAGE OF  UTERUS     FLEXIBLE SIGMOIDOSCOPY N/A 09/27/2023   Procedure: FLEXIBLE SIGMOIDOSCOPY;  Surgeon: Rosalie Kitchens, MD;  Location: [facility] ENDOSCOPY;  Service: Gastroenterology;  Laterality: N/A;   HIP SURGERY Right 04/2020   rod and screw in right hip    MOHS SURGERY  2005   LEFT NASAL BRIDGE FOR BASAL CELL   TONSILLECTOMY  age 63   Patient Active Problem List   Diagnosis Date Noted   Elevated troponin 08/13/2024   Sinus tachycardia 08/13/2024   Chest pain 08/11/2024   Hyponatremia 09/26/2023   Cellulitis of left buttock 09/26/2023   Proctitis 09/25/2023   History of microscopic colitis 09/25/2023   GERD (gastroesophageal reflux disease) 09/25/2023   Sepsis secondary to UTI (HCC) 06/24/2022   Extensor tenosynovitis of right wrist 06/23/2022   Thrombocytopenia 06/23/2022   Prediabetes 06/23/2022   Intertrochanteric fracture of right femur, closed, with nonunion, subsequent encounter 01/22/2022   History of ulcerative colitis 07/10/2021   Complete right bundle branch block (RBBB) 05/15/2021   Body mass index (BMI) of 19.0-19.9 in adult 05/12/2021   Bilateral nephrolithiasis 09/21/2020   Senile purpura 08/17/2020   Bilateral temporomandibular joint pain 06/15/2020   Dizziness 03/25/2019   Spondylolisthesis at L4-L5 level 11/25/2018   Primary  hypertension    Hyperlipidemia    Hypothyroidism    GERD    Asthma    DJD (degenerative joint disease)    Vitamin D  deficiency    Osteoporosis with current pathological fracture    Allergy     PCP: Espinoza, Alejandra, DO  REFERRING PROVIDER: Espinoza, Alejandra, DO   REFERRING DIAG: R42 (ICD-10-CM) - Dizziness and giddiness   THERAPY DIAG:  Dizziness and giddiness  Unsteadiness on feet  ONSET DATE: several months ago   Rationale for Evaluation and Treatment: Rehabilitation  SUBJECTIVE:   SUBJECTIVE STATEMENT: Still having the dizziness and I'm supposed to have and MRI.  MRI is scheduled for 12/14/24.  Been doing the exercises.   Pt  accompanied by: self  PERTINENT HISTORY: Benign labile HTN, HOH, HLD, neuropathy, spinal stenosis, back surgery, R hip surgery   PAIN:  Are you having pain? Yes: NPRS scale: 8/10 Pain location: head and neck Pain description: tightness, pain Aggravating factors: turning and moving head Relieving factors: heating pad  PRECAUTIONS: Fall  RED FLAGS: None   WEIGHT BEARING RESTRICTIONS: No  FALLS: Has patient fallen in last 6 months? No  LIVING ENVIRONMENT: Lives with: lives with their spouse; caregiver for her husband who has dementia  Lives in: House/apartment Stairs: 4 steps to enter with handrail Has following equipment at home: Single point cane, Environmental Consultant - 2 wheeled, and Grab bars  PLOF: Independent, Vocation/Vocational requirements: retired, and Leisure: pt is not following an exercise regimen  PATIENT GOALS: improve dizziness   OBJECTIVE:   Pt reports neck pain Reports that dizziness is from her eyes, not like the room is spinning Dizziness= imbalance, especially when she stands up  TODAY'S TREATMENT: 12/04/2024 Activity Comments  Vitals sitting 133/81, HR 87 sitting  STM and gentle manual stretching/TPR to UT bilaterally; pt very tight and tender with palpable trigger points noted R UT Pt notes relief and decrease in pain  Seated neck/shoulder exercises: Gentle shoulder rolls 2 x 5 Gentle scapular retraction 2 x 5 Gentle chin tucks, 2 x 5 Reports relief, pain at 4-510  Standing balance at counter: -Review of feet together EC head turns/nods -Partial Tandem stance, each position 15 seconds -sidestepping at counter, 3 reps -forward/back walking at counter, 2 reps  -Mild sway  Imbalance getting into/out of position  Cues for UE support            Access Code: [email or web address] URL: [email or web address] Date: 12/04/2024 Prepared by: [facility] - Outpatient  Rehab - [facility]  Exercises - Corner Balance Feet Together: Eyes Closed With Head  Turns  - 2 x daily - 7 x weekly - 2 sets - 5 reps - Seated Shoulder Rolls  - 1-2 x daily - 7 x weekly - 2 sets - 5-10 reps - Seated Scapular Retraction  - 1-2 x daily - 7 x weekly - 2 sets - 5-10 reps - Seated Cervical Retraction  - 1-2 x daily - 7 x weekly - 2 sets - 5-10 reps - Side Stepping with Counter Support  - 1 x daily - 5 x weekly - 1 sets - 3-5 reps - Backward Walking with Counter Support  - 1 x daily - 5 x weekly - 1 sets - 3-5 reps   PATIENT EDUCATION: Education details:  Review and updates to HEP; reviewed rationale for vestibular therapy and that consistent appointments are needed to continue to make progress and improve her dizziness/imbalance Person educated: Patient Education method: Explanation, Demonstration, and Handouts Education comprehension: verbalized  understanding, returned demonstration, and needs further education  ------------------------------------------ Note: Objective measures were completed at Evaluation unless otherwise noted.  DIAGNOSTIC FINDINGS: 08/24/24 head CT: No evidence of acute intracranial abnormality. 2. Mild chronic small vessel ischemic disease.   COGNITION: Overall cognitive status: Within functional limits for tasks assessed   SENSATION: Pt reports neuropathy in B hands/feet  POSTURE:  rounded shoulders, forward head, and increased thoracic kyphosis  GAIT: Gait pattern: flexed knees and hips in gait, short B step length, unsteady Assistive device utilized: None Level of assistance: SBA  PATIENT SURVEYS:  DHI: 44/100  VESTIBULAR ASSESSMENT   GENERAL OBSERVATION: pt wears bifocals    OCULOMOTOR EXAM: Ocular Alignment: Cover/Uncover Test: WNL Cover/Cross Cover Test: WNL; L eye rests slightly superiorly (Lhypertropia)   Ocular ROM: No Limitations   Spontaneous Nystagmus: absent;    Gaze-Induced Nystagmus: absent   Smooth Pursuits: intact; c/o dizzy   Saccades: slow   Convergence/Divergence: 8 cm d/t L eye insufficiency     VESTIBULAR - OCULAR REFLEX:    Slow VOR: Normal, slow; c/o dizziness horizontal    VOR Cancellation: Comment: difficulty maintaining tracking to L; c/o dizziness    Head-Impulse Test: HIT Right: negative HIT Left: positive     AUDITORY SCREEN:  Rinne Test: NT; pt wears hearing aids     POSITIONAL TESTING:  Right Roll Test: negative; c/o latent dizziness Left Roll Test: small amplitude L upbeating torsional nystagmus and dizziness lasting ~1 min  C/o dizziness upon sitting up   Right Loaded Dix-Hallpike: c/o dizziness; very small amplitude nystagmus lasting ~15 sec Left Loaded Dix-Hallpike: negative  *pt c/o dizziness upon sitting up from B [facility]                                                                                                                                 TREATMENT DATE: 10/30/24  PATIENT EDUCATION: Education details: encouraged pt to have family member/friend to drive her to sessions in case of symptom exacerbation during sessions- she reports that she does not have anyone to drive her, handout on BPPV and visit expectations, prognosis, POC, edu on exam findings and relevance to functional impairments  Person educated: Patient Education method: Explanation, Demonstration, Tactile cues, Verbal cues, and Handouts Education comprehension: verbalized understanding  HOME EXERCISE PROGRAM: Not initiated   GOALS: Goals reviewed with patient? Yes  SHORT TERM GOALS: Target date: 11/13/2024  Patient to be independent with initial HEP. Baseline: HEP initiated Goal status: IN PROGRESS    LONG TERM GOALS: Target date: 12/11/2024  Patient to be independent with advanced HEP. Baseline: initiated and updated, 12/04/2024 Goal status: IN PROGRESS  Patient to report 0/10 dizziness with standing vertical and horizontal VOR for 30 seconds. Baseline: Unable Goal status: IN PROGRESS  Patient will report 0/10 dizziness with bed mobility.  Baseline: Symptomatic  Goal  status: IN PROGRESS  Patient to score at least 20/24 on DGI in order to decrease risk of  falls. Baseline: 13/24 11/23/24 Goal status: IN PROGRESS  to improve to at least 2.62 ft/sec for improved community ambulation. Baseline: 2.49 ft/sec 11/23/2024 Goal status: INITIAL  Patient will ambulate with LRAD while performing head turns to scan environment with good stability in order to indicate safe community mobility. Baseline: NT Goal status: IN PROGRESS  Patient to score at least 18 points less on DHI in order to meet MCID and improve functional outcomes.  Baseline: 44 Goal status: IN PROGRESS   ASSESSMENT:  CLINICAL IMPRESSION: Pt presents today with reports of being frustrated with her continued dizziness and that she is scheduled for an MRI on 12/14/24.  With questioning patient, she again describes dizziness as imbalance and she notes tightness in her neck.  Worked on soft tissue massage to bilateral UT, with significant tightness and tenderness and trigger points R UT, with pt noting definite relief after STM and gentle shoulder/neck exercises.  Reviewed her balance exercise from last visit and updated additional counter balance to engage hip abduction and extension, as pt is quite unstable in attempts at semi-tandem stance position.  She would like to continue therapy and she would like to wait until after she gets results from MRI.  Pt will continue to benefit from skilled PT towards goals for improved functional mobility and decreased fall risk; will reassess goals and send recert when pt returns to PT.  OBJECTIVE IMPAIRMENTS: Abnormal gait, decreased activity tolerance, decreased balance, decreased knowledge of use of DME, dizziness, and postural dysfunction.   ACTIVITY LIMITATIONS: carrying, lifting, bending, sitting, standing, squatting, sleeping, stairs, transfers, bed mobility, bathing, toileting, dressing, reach over head, hygiene/grooming, locomotion level, and caring for  others  PARTICIPATION LIMITATIONS: meal prep, cleaning, laundry, driving, shopping, community activity, and church  PERSONAL FACTORS: Age, Fitness, Past/current experiences, Time since onset of injury/illness/exacerbation, and 3+ comorbidities: Benign labile HTN, HOH, HLD, neuropathy, spinal stenosis, back surgery, R hip surgery  are also affecting patient's functional outcome.   REHAB POTENTIAL: Good  CLINICAL DECISION MAKING: Evolving/moderate complexity  EVALUATION COMPLEXITY: Moderate   PLAN:  PT FREQUENCY: 1-2x/week  PT DURATION: 6 weeks  PLANNED INTERVENTIONS: 97164- PT Re-evaluation, 97110-Therapeutic exercises, 97530- Therapeutic activity, [id number]- Neuromuscular re-education, 97535- Self Care, 02859- Manual therapy, [id number]- Gait training, [phone]- Canalith repositioning, [phone] (1-2 muscles), 20561 (3+ muscles)- Dry Needling, Patient/Family education, Balance training, Stair training, Taping, Joint mobilization, Spinal mobilization, Vestibular training, DME instructions, Cryotherapy, and Moist heat  PLAN FOR NEXT SESSION: Check LTGs and recert.  Work on neck exercises, STM as needed, counter/corner balance exercises.   Greig Anon, PT 12/04/24 11:01 AM Phone: [phone] Fax: [phone]  [facility] at [facility] [address] [place], [address] [place], [place] [address] Phone # [phone] Fax # [phone]   "

## 2024-12-10 MED FILL — VELSIPITY 2 MG TABLET: ORAL | 60 days supply | Qty: 30 | Fill #2

## 2024-12-10 NOTE — Progress Notes (Unsigned)
 " Cardiology Office Note   Date:  12/10/2024  ID:  Debbie Houston, DOB [birth year], MRN [id number] PCP: Chet Mad, DO  [facility] Providers Cardiologist:  Madonna Large, DO    History of Present Illness Debbie Houston is a 84 y.o. female with past medical history of hypertension/labile hypertension, hyperlipidemia, prior cellulitis, hypothyroidism, RBBB, GERD, ulcerative colitis, neuropathy, former tobacco use.  Patient followed by Dr. Large and presents today for  follow-up appointment.   Patient was initially referred to cardiology for preoperative evaluation prior to hip replacement.  Echocardiogram 10/2021 showed EF 60 to 65%, normal global wall motion, no significant valvular abnormalities.  Stress test in 11/2021 showed normal myocardial perfusion.  Had a CT chest in 04/2022 that did not show significant coronary calcifications.   She was admitted in 07/2024.  She had presented to the [facility] long ED with chest pain and dizziness.  High-sensitivity troponin 43> 36.  EKG with RBBB and LPFB.  Cardiology was consulted.  Reported that her chest pain was worse when laying flat at night to sleep and resolved with Tums or if she would sit up.  She was very active in her daily life taking care of her husband and adult child with special needs, able to complete more than 4.0 METS without angina.  Underwent echocardiogram 08/12/2024 that showed EF 60 to 65% no regional wall motion abnormalities, grade 1 DD, normal RV systolic function, no significant valvular abnormalities.  She was discharged with plans for an outpatient coronary CTA. This is scheduled for next week    During that admission, patient also reported some positional dizziness.  Her BP medications were adjusted.  She had sinus tachycardia during her admission.  Restarted back on bisoprolol  5 mg daily which she had stopped taking as an outpatient.  Was discharged with a 2-week monitor which showed predominantly normal sinus rhythm, 2  episodes of SVT with longest lasting 7 beats, rare PVCs and PACs   She underwent coronary CTA 09/16/24 that showed minimal mixed non-obstructive CAD. Coronary calcium  score was 539 (77th percentile).   Chest pain  GERD  - Patient was seen in the ED 9/24 with chest pain.  Pain occurred when laying flat.  Improved with Tums or sitting upright.  High-sensitivity troponin 43> 36 - Echocardiogram 9/24 showed EF 60-65%, no regional wall motion abnormalities, grade 1 DD, normal RV systolic function, no significant valvular abnormalities - Coronary CTA from 08/2024 showed a coronary calcium  score of 539 (77th percentile), minimal mixed non-obstructive CAD  - Continue protonix  for GERD  - Continue aspirin  81 mg daily, simvastatin  20 mg daily, bisoprolol  5 mg daily    HTN  - BP well controlled. No symptoms concerning for orthostatic hypotension  - Continue bisoprolol  5 mg daily, olmesartan 20 mg daily - K4.1, creatinine 0.72 on 9/25   Sinus Tachycardia  - In the ED in 07/2024, patient noted to have sinus tachycardia with heart rate in the low 100s.  She had stopped taking her bisoprolol  5 mg daily at home, this was resumed -TSH elevated but free T4 normal on 9/25  - Outpatient monitor postdischarge showed predominantly normal sinus rhythm with average HR 68 BPM. 2 episodes of SVT with the longest lasing 7 beats. Rare PVCs and PACs  - Patient has rare, mild palpitations that do not bother her much  - Continue bisoprolol  5 mg daily    Dizziness  - Patient reported dizziness when seen in the ED 9/24 - 9/25.  Symptoms  seem to be positional.  Orthostatic vital signs negative. Patient thinks this is related to her GI medications    HLD  - Lipid panel 08/13/2024 showed LDL 102, HDL 68, total cholesterol 817, triglycerides 61 - Continue simvastatin  20 mg daily  ROS: ***  Studies Reviewed      *** Risk Assessment/Calculations {Does this patient have ATRIAL FIBRILLATION?:[phone]} No BP recorded.   {Refresh Note OR Click here to enter BP  :1}***       Physical Exam VS:  There were no vitals taken for this visit.       Wt Readings from Last 3 Encounters:  10/06/24 112 lb (50.8 kg)  09/22/24 113 lb (51.3 kg)  09/11/24 112 lb 3.2 oz (50.9 kg)    GEN: Well nourished, well developed in no acute distress NECK: No JVD; No carotid bruits CARDIAC: ***RRR, no murmurs, rubs, gallops RESPIRATORY:  Clear to auscultation without rales, wheezing or rhonchi  ABDOMEN: Soft, non-tender, non-distended EXTREMITIES:  No edema; No deformity   ASSESSMENT AND PLAN ***    {Are you ordering a CV Procedure (e.g. stress test, cath, DCCV, TEE, etc)?   Press F2        :[id number]}  Dispo: ***  Signed, Rollo FABIENE Louder, PA-C   "

## 2024-12-10 NOTE — Progress Notes (Signed)
 [facility] Clinical Assessment & Refill Coordination Note    Patient approves [phone] copay which almost meets Medicare Part D out-of-pocket maximum for 2026    Traci Bennett, DOB: 1941-01-16  Phone: [phone] (home)     All above HIPAA information was verified with patient.     Was a nurse, learning disability used for this call? No    Specialty Medication(s):   Inflammatory Disorders: Velsipity      Current Medications[1]     Changes to medications: Loralei reports no changes at this time.    Medication list has been reviewed and updated in Epic: Yes    Allergies[2]    Changes to allergies: No    Allergies have been reviewed and updated in Epic: Yes    SPECIALTY MEDICATION ADHERENCE     Ve;so[otu 2 mg: 0 doses of medicine on hand   Specialty medication is an injection or given on a cycle: No    Medication Adherence    Patient reported X missed doses in the last month: 0  Specialty Medication: VELSIPITY  2 mg Tab  Patient is on additional specialty medications: No          Specialty medication(s) dose(s) confirmed: Regimen is correct and unchanged.     Are there any concerns with adherence? No    Adherence counseling provided? Not needed    CLINICAL MANAGEMENT AND INTERVENTION      Clinical Benefit Assessment:    Do you feel the medicine is effective or helping your condition? Yes    Clinical Benefit counseling provided? Not needed    Adverse Effects Assessment:    Are you experiencing any side effects? No    Are you experiencing difficulty administering your medicine? No    Quality of Life Assessment:    Quality of Life    Rheumatology  Oncology  Dermatology  Cystic Fibrosis          How many days over the past month did your UC  keep you from your normal activities? For example, brushing your teeth or getting up in the morning. 0    Have you discussed this with your provider? Not needed    Acute Infection Status:    Acute infections noted within Epic:  No active infections    Patient reported infection: None    Therapy Appropriateness:    Is the medication and dose appropriate considering the patient???s diagnosis, treatment, and disease journey, comorbidities, medical history, current medications, allergies, therapeutic goals, self-administration ability, and access barriers? Yes, therapy is appropriate and should be continued     Clinical Intervention:    Was an intervention completed as part of this clinical assessment? No    DISEASE/MEDICATION-SPECIFIC INFORMATION      N/A    Chronic Inflammatory Diseases: Have you experienced any flares in the last month? No  Has this been reported to your provider? Not applicable    PATIENT SPECIFIC NEEDS     Does the patient have any physical, cognitive, or cultural barriers? No    Is the patient high risk? No    Does the patient require physician intervention or other additional services (i.e., nutrition, smoking cessation, social work)? No    Does the patient have an additional or emergency contact listed in their chart? Yes    SOCIAL DETERMINANTS OF HEALTH     At the [facility], we have learned that life circumstances - like trouble affording food, housing, utilities, or transportation can affect the health of  many of our patients.   That is why we wanted to ask: are you currently experiencing any life circumstances that are negatively impacting your health and/or quality of life? No    Social Drivers of Health     Food Insecurity: No Food Insecurity (09/30/2023)    Received from [facility]    Hunger Vital Sign     Within the past 12 months, you worried that your food would run out before you got the money to buy more.: Never true     Within the past 12 months, the food you bought just didn't last and you didn't have money to get more.: Never true   Tobacco Use: Medium Risk (06/30/2024)    Patient History     Smoking Tobacco Use: Former     Smokeless Tobacco Use: Never     Passive Exposure: Not on file   Transportation Needs: No Transportation Needs (09/30/2023)    Received from [facility] - Transportation     Lack of Transportation (Medical): No     Lack of Transportation (Non-Medical): No   Alcohol Use: Not on file   Housing: Low Risk  (07/11/2023)    Received from [facility] Stability Vital Sign     What is your living situation today?: I have a steady place to live     Think about the place you live. Do you have problems with any of the following? Choose all that apply:: None/None on this list   Physical Activity: Not on file   Utilities: Not At Risk (09/30/2023)    Received from [facility] Utilities     Threatened with loss of utilities: No   Stress: Not on file   Interpersonal Safety: Patient Unable To Answer (06/30/2024)    Interpersonal Safety     Unsafe Where You Currently Live: Patient unable to answer     Physically Hurt by Anyone: Patient unable to answer     Abused by Anyone: Patient unable to answer   Substance Use: Not on file (09/23/2023)   Intimate Partner Violence: Patient Unable To Answer (06/30/2024)    Humiliation, Afraid, Rape, and Kick questionnaire     Fear of Current or Ex-Partner: Patient unable to answer     Emotionally Abused: Patient unable to answer     Physically Abused: Patient unable to answer     Sexually Abused: Patient unable to answer   Social Connections: Not on file   Financial Resource Strain: Not on file   Health Literacy: Not on file   Internet Connectivity: Not on file       Would you be willing to receive help with any of the needs that you have identified today? Not applicable       SHIPPING     Specialty Medication(s) to be Shipped:   Inflammatory Disorders: Velsipity     Other medication(s) to be shipped: No additional medications requested for fill at this time    Specialty Medications not needed at this time: N/A     Changes to insurance: No    Cost and Payment: Patient has a copay of $2067.14. They are aware and have authorized the pharmacy to charge the credit card on file.    Delivery Scheduled: Yes, Expected medication delivery date: 1/23.     Medication will be delivered via UPS to the confirmed prescription address in [facility].    The patient will receive a drug  information handout for each medication shipped and additional FDA Medication Guides as required.  Verified that patient has previously received a Conservation Officer, Historic Buildings and a Surveyor, Mining.    The patient or caregiver noted above participated in the development of this care plan and knows that they can request review of or adjustments to the care plan at any time.      All of the patient's questions and concerns have been addressed.    Harlene DELENA Elder, PharmD   [facility] Specialty Pharmacist       [1]   Current Outpatient Medications   Medication Sig Dispense Refill    acetaminophen (TYLENOL) 500 MG tablet Take 1 tablet (500 mg total) by mouth.      ascorbic acid, vitamin C, (VITAMIN C) 1000 MG tablet Take 1 tablet (1,000 mg total) by mouth.      aspirin (ECOTRIN) 81 MG tablet Take 1 tablet (81 mg total) by mouth daily.      bisoprolol (ZEBETA) 5 MG tablet Take 1 tablet (5 mg total) by mouth daily.      calcium carbonate 1,500 mg (600 mg elem calcium) tablet 1 tab Orally once a day      calcium carbonate-vit D3-min 600 mg calcium- 400 unit Tab Take by mouth.      cetirizine (ZYRTEC) 10 MG tablet Take 1 tablet (10 mg total) by mouth.      cholecalciferol, vitamin D3-50 mcg, 2,000 unit,, 50 mcg (2,000 unit) tablet Take 1 tablet (50 mcg total) by mouth daily.      cinnamon bark 500 mg capsule Take 500 mg by mouth.      colestipol  (COLESTID ) 1 gram tablet TAKE 4 TABLETS(4 GRAMS) BY MOUTH THREE TIMES DAILY 360 tablet 1    cycloSPORINE (RESTASIS) 0.05 % ophthalmic emulsion Administer 1 drop to both eyes every twelve (12) hours.      denosumab (PROLIA SUBQ) Inject under the skin every six (6) months.      etrasimod (VELSIPITY ) 2 mg Tab Take 1 tablet (2 mg) by mouth every other day. 30 tablet 2    famotidine (PEPCID) 40 MG tablet Take 10 mg by mouth every evening.      fluticasone propionate (FLONASE) 50 mcg/actuation nasal spray 2 sprays into each nostril.      Lactobacillus acidophilus 10 billion cell cap Take 1 capsule by mouth daily.      levothyroxine 88 mcg cap Take 1 capsule (88 mcg total) by mouth daily. For 6 days, skip the 7th day      loperamide  (IMODIUM ) 2 mg capsule TAKE 1 CAPSULE BY MOUTH FOUR TIMES DAILY AS NEEDED FOR DIARRHEA 120 capsule 5    meclizine (ANTIVERT) 25 mg tablet 1/2-1 pill up to 3 times daily for motion sickness/dizziness      multivitamin therapeutic with minerals (THERA-M) 27-0.4 mg Tab Take 1 tablet by mouth daily.      omega-3 fatty acids-fish oil 360-1,200 mg cap Take 1,200 mg by mouth.      pantoprazole  (PROTONIX ) 40 MG tablet TAKE 1 TABLET BY MOUTH TWICE DAILY BEFORE BREAKFAST AND BEFORE DINNER 180 tablet 2    simvastatin (ZOCOR) 20 MG tablet Take 1 tablet (20 mg total) by mouth daily before breakfast.      triamcinolone (KENALOG) 0.5 % ointment Apply 1 Application topically Two (2) times a day (at 8am and 12:00).       No current facility-administered medications for this visit.   [2]  Allergies  Allergen Reactions    Bupropion Hives and Other (See Comments)    Infliximab Other (See Comments)     Unknown    Montelukast Other (See Comments)     Reaction:  Makes pt jittery    Reaction:  Makes pt jittery      Reaction:  Makes pt jittery Makes pt jittery      Makes pt jittery    Montelukast Sodium Other (See Comments)     Reaction:  Makes pt jittery  Makes pt jittery      Alendronate Sodium Other (See Comments) and Rash     Reaction:  GI upset   GI upset       Clindamycin Rash    Other Rash    Sulfa (Sulfonamide Antibiotics) Rash

## 2024-12-14 ENCOUNTER — Other Ambulatory Visit

## 2024-12-17 ENCOUNTER — Other Ambulatory Visit: Payer: Self-pay | Admitting: Family Medicine

## 2024-12-17 ENCOUNTER — Ambulatory Visit
Admission: RE | Admit: 2024-12-17 | Discharge: 2024-12-17 | Disposition: A | Discharge status: Home / Self Care | Source: Ambulatory Visit | Attending: Family Medicine | Admitting: Family Medicine

## 2024-12-17 DIAGNOSIS — W19XXXA Unspecified fall, initial encounter: Secondary | ICD-10-CM

## 2024-12-22 ENCOUNTER — Ambulatory Visit: Admitting: Cardiology

## 2024-12-22 ENCOUNTER — Ambulatory Visit
Admission: RE | Admit: 2024-12-22 | Discharge: 2024-12-22 | Disposition: A | Source: Ambulatory Visit | Attending: Family Medicine | Admitting: Family Medicine

## 2024-12-22 DIAGNOSIS — R6889 Other general symptoms and signs: Secondary | ICD-10-CM

## 2024-12-22 DIAGNOSIS — R519 Headache, unspecified: Secondary | ICD-10-CM

## 2024-12-22 DIAGNOSIS — R09A2 Foreign body sensation, throat: Secondary | ICD-10-CM

## 2024-12-22 DIAGNOSIS — R42 Dizziness and giddiness: Secondary | ICD-10-CM

## 2024-12-23 ENCOUNTER — Ambulatory Visit: Admitting: Physical Therapy

## 2024-12-23 NOTE — Therapy (Signed)
 " OUTPATIENT PHYSICAL THERAPY VESTIBULAR TREATMENT     Patient Name: Debbie Houston MRN: [id number] DOB:12/08/1940, 84 y.o., female Today's Date: 12/23/2024  END OF SESSION:      Past Medical History:  Diagnosis Date   Arthritis    Benign labile hypertension    Cancer (HCC)    skin cancer on face   Cellulitis    Colitis    DJD (degenerative joint disease)    Endometrial polyp    GERD (gastroesophageal reflux disease)    History of basal cell carcinoma excision    History of esophagitis    HOH (hard of hearing)    right ear better than left  -- wears no aides   Hyperlipidemia    Hypothyroidism    Neuromuscular disorder (HCC)    neuropathy in feet   Osteopenia    Prediabetes    S/P right hip fracture 08/17/2020   Sepsis (HCC) 2023   per patient   Spinal stenosis 2025   per patient   Vitamin D  deficiency    Wears glasses    Past Surgical History:  Procedure Laterality Date   BACK SURGERY  2020   BIOPSY  09/27/2023   Procedure: BIOPSY;  Surgeon: Rosalie Kitchens, MD;  Location: [facility] ENDOSCOPY;  Service: Gastroenterology;;   CATARACT EXTRACTION, BILATERAL Bilateral 2020   CONVERSION TO TOTAL HIP Right 01/22/2022   Procedure: CONVERSION TO TOTAL HIP;  Surgeon: Edna Toribio LABOR, MD;  Location: [facility] ORS;  Service: Orthopedics;  Laterality: Right;   DILATATION & CURETTAGE/HYSTEROSCOPY WITH MYOSURE N/A 10/27/2014   Procedure: DILATATION & CURETTAGE/HYSTEROSCOPY WITH MYOSURE;  Surgeon: Norleen GORMAN Skill, MD;  Location: [facility] [place];  Service: Gynecology;  Laterality: N/A;   DILATION AND CURETTAGE OF UTERUS     FLEXIBLE SIGMOIDOSCOPY N/A 09/27/2023   Procedure: FLEXIBLE SIGMOIDOSCOPY;  Surgeon: Rosalie Kitchens, MD;  Location: [facility] ENDOSCOPY;  Service: Gastroenterology;  Laterality: N/A;   HIP SURGERY Right 04/2020   rod and screw in right hip    MOHS SURGERY  2005   LEFT NASAL BRIDGE FOR BASAL CELL   TONSILLECTOMY  age 63   Patient Active Problem List   Diagnosis Date Noted    Elevated troponin 08/13/2024   Sinus tachycardia 08/13/2024   Chest pain 08/11/2024   Hyponatremia 09/26/2023   Cellulitis of left buttock 09/26/2023   Proctitis 09/25/2023   History of microscopic colitis 09/25/2023   GERD (gastroesophageal reflux disease) 09/25/2023   Sepsis secondary to UTI (HCC) 06/24/2022   Extensor tenosynovitis of right wrist 06/23/2022   Thrombocytopenia 06/23/2022   Prediabetes 06/23/2022   Intertrochanteric fracture of right femur, closed, with nonunion, subsequent encounter 01/22/2022   History of ulcerative colitis 07/10/2021   Complete right bundle branch block (RBBB) 05/15/2021   Body mass index (BMI) of 19.0-19.9 in adult 05/12/2021   Bilateral nephrolithiasis 09/21/2020   Senile purpura 08/17/2020   Bilateral temporomandibular joint pain 06/15/2020   Dizziness 03/25/2019   Spondylolisthesis at L4-L5 level 11/25/2018   Primary hypertension    Hyperlipidemia    Hypothyroidism    GERD    Asthma    DJD (degenerative joint disease)    Vitamin D  deficiency    Osteoporosis with current pathological fracture    Allergy     PCP: Espinoza, Alejandra, DO  REFERRING PROVIDER: Espinoza, Alejandra, DO   REFERRING DIAG: R42 (ICD-10-CM) - Dizziness and giddiness   THERAPY DIAG:  No diagnosis found.  ONSET DATE: several months ago   Rationale for Evaluation and  Treatment: Rehabilitation  SUBJECTIVE:   SUBJECTIVE STATEMENT: Still having the dizziness and I'm supposed to have and MRI.  MRI is scheduled for 12/14/24.  Been doing the exercises.   Pt accompanied by: self  PERTINENT HISTORY: Benign labile HTN, HOH, HLD, neuropathy, spinal stenosis, back surgery, R hip surgery   PAIN:  Are you having pain? Yes: NPRS scale: 8/10 Pain location: head and neck Pain description: tightness, pain Aggravating factors: turning and moving head Relieving factors: heating pad  PRECAUTIONS: Fall  RED FLAGS: None   WEIGHT BEARING RESTRICTIONS:  No  FALLS: Has patient fallen in last 6 months? No  LIVING ENVIRONMENT: Lives with: lives with their spouse; caregiver for her husband who has dementia  Lives in: House/apartment Stairs: 4 steps to enter with handrail Has following equipment at home: Single point cane, Environmental Consultant - 2 wheeled, and Grab bars  PLOF: Independent, Vocation/Vocational requirements: retired, and Leisure: pt is not following an exercise regimen  PATIENT GOALS: improve dizziness   OBJECTIVE:     TODAY'S TREATMENT: 12/24/24 Activity Comments                        Pt reports neck pain Reports that dizziness is from her eyes, not like the room is spinning Dizziness= imbalance, especially when she stands up  TODAY'S TREATMENT: 12/04/2024 Activity Comments  Vitals sitting 133/81, HR 87 sitting  STM and gentle manual stretching/TPR to UT bilaterally; pt very tight and tender with palpable trigger points noted R UT Pt notes relief and decrease in pain  Seated neck/shoulder exercises: Gentle shoulder rolls 2 x 5 Gentle scapular retraction 2 x 5 Gentle chin tucks, 2 x 5 Reports relief, pain at 4-510  Standing balance at counter: -Review of feet together EC head turns/nods -Partial Tandem stance, each position 15 seconds -sidestepping at counter, 3 reps -forward/back walking at counter, 2 reps  -Mild sway  Imbalance getting into/out of position  Cues for UE support            Access Code: [email or web address] URL: [email or web address] Date: 12/04/2024 Prepared by: [facility] - Outpatient  Rehab - [facility]  Exercises - Corner Balance Feet Together: Eyes Closed With Head Turns  - 2 x daily - 7 x weekly - 2 sets - 5 reps - Seated Shoulder Rolls  - 1-2 x daily - 7 x weekly - 2 sets - 5-10 reps - Seated Scapular Retraction  - 1-2 x daily - 7 x weekly - 2 sets - 5-10 reps - Seated Cervical Retraction  - 1-2 x daily - 7 x weekly - 2 sets - 5-10 reps - Side Stepping with Counter Support   - 1 x daily - 5 x weekly - 1 sets - 3-5 reps - Backward Walking with Counter Support  - 1 x daily - 5 x weekly - 1 sets - 3-5 reps   PATIENT EDUCATION: Education details:  Review and updates to HEP; reviewed rationale for vestibular therapy and that consistent appointments are needed to continue to make progress and improve her dizziness/imbalance Person educated: Patient Education method: Explanation, Demonstration, and Handouts Education comprehension: verbalized understanding, returned demonstration, and needs further education  ------------------------------------------ Note: Objective measures were completed at Evaluation unless otherwise noted.  DIAGNOSTIC FINDINGS: 08/24/24 head CT: No evidence of acute intracranial abnormality. 2. Mild chronic small vessel ischemic disease.   COGNITION: Overall cognitive status: Within functional limits for tasks assessed   SENSATION: Pt reports neuropathy in B hands/feet  POSTURE:  rounded shoulders, forward head, and increased thoracic kyphosis  GAIT: Gait pattern: flexed knees and hips in gait, short B step length, unsteady Assistive device utilized: None Level of assistance: SBA  PATIENT SURVEYS:  DHI: 44/100  VESTIBULAR ASSESSMENT   GENERAL OBSERVATION: pt wears bifocals    OCULOMOTOR EXAM: Ocular Alignment: Cover/Uncover Test: WNL Cover/Cross Cover Test: WNL; L eye rests slightly superiorly (Lhypertropia)   Ocular ROM: No Limitations   Spontaneous Nystagmus: absent;    Gaze-Induced Nystagmus: absent   Smooth Pursuits: intact; c/o dizzy   Saccades: slow   Convergence/Divergence: 8 cm d/t L eye insufficiency    VESTIBULAR - OCULAR REFLEX:    Slow VOR: Normal, slow; c/o dizziness horizontal    VOR Cancellation: Comment: difficulty maintaining tracking to L; c/o dizziness    Head-Impulse Test: HIT Right: negative HIT Left: positive     AUDITORY SCREEN:  Rinne Test: NT; pt wears hearing aids     POSITIONAL TESTING:   Right Roll Test: negative; c/o latent dizziness Left Roll Test: small amplitude L upbeating torsional nystagmus and dizziness lasting ~1 min  C/o dizziness upon sitting up   Right Loaded Dix-Hallpike: c/o dizziness; very small amplitude nystagmus lasting ~15 sec Left Loaded Dix-Hallpike: negative  *pt c/o dizziness upon sitting up from B [facility]                                                                                                                                 TREATMENT DATE: 10/30/24  PATIENT EDUCATION: Education details: encouraged pt to have family member/friend to drive her to sessions in case of symptom exacerbation during sessions- she reports that she does not have anyone to drive her, handout on BPPV and visit expectations, prognosis, POC, edu on exam findings and relevance to functional impairments  Person educated: Patient Education method: Explanation, Demonstration, Tactile cues, Verbal cues, and Handouts Education comprehension: verbalized understanding  HOME EXERCISE PROGRAM: Not initiated   GOALS: Goals reviewed with patient? Yes  SHORT TERM GOALS: Target date: 11/13/2024  Patient to be independent with initial HEP. Baseline: HEP initiated Goal status: IN PROGRESS    LONG TERM GOALS: Target date: 12/11/2024  Patient to be independent with advanced HEP. Baseline: initiated and updated, 12/04/2024 Goal status: IN PROGRESS  Patient to report 0/10 dizziness with standing vertical and horizontal VOR for 30 seconds. Baseline: Unable Goal status: IN PROGRESS  Patient will report 0/10 dizziness with bed mobility.  Baseline: Symptomatic  Goal status: IN PROGRESS  Patient to score at least 20/24 on DGI in order to decrease risk of falls. Baseline: 13/24 11/23/24 Goal status: IN PROGRESS  to improve to at least 2.62 ft/sec for improved community ambulation. Baseline: 2.49 ft/sec 11/23/2024 Goal status: INITIAL  Patient will ambulate with LRAD while  performing head turns to scan environment with good stability in order to indicate safe community mobility. Baseline: NT Goal status: IN PROGRESS  Patient to  score at least 18 points less on DHI in order to meet MCID and improve functional outcomes.  Baseline: 44 Goal status: IN PROGRESS   ASSESSMENT:  CLINICAL IMPRESSION: Pt presents today with reports of being frustrated with her continued dizziness and that she is scheduled for an MRI on 12/14/24.  With questioning patient, she again describes dizziness as imbalance and she notes tightness in her neck.  Worked on soft tissue massage to bilateral UT, with significant tightness and tenderness and trigger points R UT, with pt noting definite relief after STM and gentle shoulder/neck exercises.  Reviewed her balance exercise from last visit and updated additional counter balance to engage hip abduction and extension, as pt is quite unstable in attempts at semi-tandem stance position.  She would like to continue therapy and she would like to wait until after she gets results from MRI.  Pt will continue to benefit from skilled PT towards goals for improved functional mobility and decreased fall risk; will reassess goals and send recert when pt returns to PT.  OBJECTIVE IMPAIRMENTS: Abnormal gait, decreased activity tolerance, decreased balance, decreased knowledge of use of DME, dizziness, and postural dysfunction.   ACTIVITY LIMITATIONS: carrying, lifting, bending, sitting, standing, squatting, sleeping, stairs, transfers, bed mobility, bathing, toileting, dressing, reach over head, hygiene/grooming, locomotion level, and caring for others  PARTICIPATION LIMITATIONS: meal prep, cleaning, laundry, driving, shopping, community activity, and church  PERSONAL FACTORS: Age, Fitness, Past/current experiences, Time since onset of injury/illness/exacerbation, and 3+ comorbidities: Benign labile HTN, HOH, HLD, neuropathy, spinal stenosis, back surgery, R hip  surgery  are also affecting patient's functional outcome.   REHAB POTENTIAL: Good  CLINICAL DECISION MAKING: Evolving/moderate complexity  EVALUATION COMPLEXITY: Moderate   PLAN:  PT FREQUENCY: 1-2x/week  PT DURATION: 6 weeks  PLANNED INTERVENTIONS: 97164- PT Re-evaluation, 97110-Therapeutic exercises, 97530- Therapeutic activity, [id number]- Neuromuscular re-education, 97535- Self Care, [id number]- Manual therapy, [id number]- Gait training, [phone]- Canalith repositioning, [id number] (1-2 muscles), 20561 (3+ muscles)- Dry Needling, Patient/Family education, Balance training, Stair training, Taping, Joint mobilization, Spinal mobilization, Vestibular training, DME instructions, Cryotherapy, and Moist heat  PLAN FOR NEXT SESSION: Check LTGs and recert.  Work on neck exercises, STM as needed, counter/corner balance exercises.     "

## 2024-12-24 ENCOUNTER — Ambulatory Visit: Admitting: Physical Therapy

## 2024-12-24 ENCOUNTER — Encounter: Payer: Self-pay | Admitting: Physical Therapy

## 2024-12-24 DIAGNOSIS — R42 Dizziness and giddiness: Secondary | ICD-10-CM

## 2024-12-24 DIAGNOSIS — R2681 Unsteadiness on feet: Secondary | ICD-10-CM

## 2024-12-24 DIAGNOSIS — H8113 Benign paroxysmal vertigo, bilateral: Secondary | ICD-10-CM

## 2024-12-28 ENCOUNTER — Ambulatory Visit

## 2024-12-28 ENCOUNTER — Ambulatory Visit: Admitting: Cardiology

## 2025-01-05 ENCOUNTER — Ambulatory Visit

## 2025-01-12 ENCOUNTER — Ambulatory Visit

## 2025-01-21 ENCOUNTER — Ambulatory Visit: Admitting: Physical Therapy

## 2025-02-23 ENCOUNTER — Ambulatory Visit: Admitting: Podiatry

## 2025-03-12 ENCOUNTER — Ambulatory Visit: Admitting: Family Medicine

## 2025-09-27 ENCOUNTER — Ambulatory Visit: Admitting: Neurology

## 2025-10-06 ENCOUNTER — Ambulatory Visit: Admitting: Rheumatology
